# Patient Record
Sex: Male | Born: 1970
Health system: Southern US, Community
[De-identification: ages and names within clinical notes are randomized; demographics above are authoritative.]

## PROBLEM LIST (undated history)

## (undated) ENCOUNTER — Emergency Department (HOSPITAL_COMMUNITY): Admission: EM

## (undated) DIAGNOSIS — R7303 Prediabetes: Secondary | ICD-10-CM

## (undated) DIAGNOSIS — G039 Meningitis, unspecified: Secondary | ICD-10-CM

## (undated) DIAGNOSIS — G709 Myoneural disorder, unspecified: Secondary | ICD-10-CM

## (undated) DIAGNOSIS — G1221 Amyotrophic lateral sclerosis: Secondary | ICD-10-CM

## (undated) HISTORY — DX: Amyotrophic lateral sclerosis: G12.21

## (undated) HISTORY — DX: Morbid (severe) obesity due to excess calories: E66.01

## (undated) HISTORY — DX: Myoneural disorder, unspecified: G70.9

## (undated) HISTORY — DX: Prediabetes: R73.03

## (undated) HISTORY — PX: NO PAST SURGERIES: SHX2092

---

## 1998-08-02 ENCOUNTER — Emergency Department (HOSPITAL_COMMUNITY): Admission: EM | Admit: 1998-08-02 | Discharge: 1998-08-02 | Payer: Self-pay | Admitting: Emergency Medicine

## 2001-07-05 DIAGNOSIS — G039 Meningitis, unspecified: Secondary | ICD-10-CM

## 2001-07-05 HISTORY — DX: Meningitis, unspecified: G03.9

## 2001-12-12 ENCOUNTER — Emergency Department (HOSPITAL_COMMUNITY): Admission: EM | Admit: 2001-12-12 | Discharge: 2001-12-12 | Payer: Self-pay | Admitting: Emergency Medicine

## 2002-01-09 ENCOUNTER — Inpatient Hospital Stay (HOSPITAL_COMMUNITY): Admission: EM | Admit: 2002-01-09 | Discharge: 2002-01-10 | Payer: Self-pay | Admitting: *Deleted

## 2002-01-17 ENCOUNTER — Encounter: Admission: RE | Admit: 2002-01-17 | Discharge: 2002-01-17 | Payer: Self-pay | Admitting: Internal Medicine

## 2011-01-20 ENCOUNTER — Emergency Department (HOSPITAL_COMMUNITY)
Admission: EM | Admit: 2011-01-20 | Discharge: 2011-01-20 | Disposition: A | Discharge status: Home / Self Care | Payer: 59 | Attending: Emergency Medicine | Admitting: Emergency Medicine

## 2011-01-20 DIAGNOSIS — T7840XA Allergy, unspecified, initial encounter: Secondary | ICD-10-CM | POA: Insufficient documentation

## 2011-01-20 DIAGNOSIS — I10 Essential (primary) hypertension: Secondary | ICD-10-CM | POA: Insufficient documentation

## 2011-01-20 DIAGNOSIS — L298 Other pruritus: Secondary | ICD-10-CM | POA: Insufficient documentation

## 2011-01-20 DIAGNOSIS — R21 Rash and other nonspecific skin eruption: Secondary | ICD-10-CM | POA: Insufficient documentation

## 2011-01-20 DIAGNOSIS — L509 Urticaria, unspecified: Secondary | ICD-10-CM | POA: Insufficient documentation

## 2011-01-20 DIAGNOSIS — L2989 Other pruritus: Secondary | ICD-10-CM | POA: Insufficient documentation

## 2011-11-03 ENCOUNTER — Emergency Department (INDEPENDENT_AMBULATORY_CARE_PROVIDER_SITE_OTHER): Payer: 59

## 2011-11-03 ENCOUNTER — Emergency Department (HOSPITAL_COMMUNITY)
Admission: EM | Admit: 2011-11-03 | Discharge: 2011-11-03 | Disposition: A | Discharge status: Home / Self Care | Payer: 59 | Source: Home / Self Care | Attending: Emergency Medicine | Admitting: Emergency Medicine

## 2011-11-03 ENCOUNTER — Encounter (HOSPITAL_COMMUNITY): Payer: Self-pay

## 2011-11-03 DIAGNOSIS — M659 Synovitis and tenosynovitis, unspecified: Secondary | ICD-10-CM

## 2011-11-03 DIAGNOSIS — M65979 Unspecified synovitis and tenosynovitis, unspecified ankle and foot: Secondary | ICD-10-CM

## 2011-11-03 MED ORDER — MELOXICAM 15 MG PO TABS: 15.0000 mg | ORAL_TABLET | Freq: Every day | ORAL | Status: AC

## 2011-11-03 MED ORDER — HYDROCODONE-ACETAMINOPHEN 5-325 MG PO TABS: 2.0000 | ORAL_TABLET | ORAL | Status: AC | PRN

## 2011-11-03 NOTE — ED Provider Notes (Signed)
History     CSN: [id number]  Arrival date & time 11/03/11  0908   First MD Initiated Contact with Patient 11/03/11 1003      Chief Complaint  Patient presents with  . Foot Pain    (Consider location/radiation/quality/duration/timing/severity/associated sxs/prior treatment) HPI Comments: Patient reports intermittent, dull, nonradiating achy pain in his first right toe for several weeks. States the pain has now moved into the MTP joint. Pain seems to be located on the bottom of foot. Mild localized swelling. No redness, hyperesthesias, fevers. Pain is worse with extension of the toe, palpation. No alleviating factors. Patient has not tried anything for this. Patient denies any change in physical activity, change in footwear, history of trauma to this foot. No history of diabetes, gout, OA, RA.  ROS as noted in HPI. All other ROS negative.   Patient is a 41 y.o. male presenting with toe pain. The history is provided by the patient. No language interpreter was used.  Toe Pain This is a new problem. The current episode started more than 2 days ago. The problem occurs constantly. The problem has been gradually worsening. The symptoms are aggravated by walking. The symptoms are relieved by nothing. He has tried nothing for the symptoms.    History reviewed. No pertinent past medical history.  History reviewed. No pertinent past surgical history.  History reviewed. No pertinent family history.  History  Substance Use Topics  . Smoking status: Never Smoker   . Smokeless tobacco: Not on file  . Alcohol Use: No      Review of Systems  Allergies  Review of patient's allergies indicates no known allergies.  Home Medications   Current Outpatient Rx  Name Route Sig Dispense Refill  . HYDROCODONE-ACETAMINOPHEN 5-325 MG PO TABS Oral Take 2 tablets by mouth every 4 (four) hours as needed for pain. 20 tablet 0  . MELOXICAM 15 MG PO TABS Oral Take 1 tablet (15 mg total) by mouth daily.  14 tablet 0    BP 134/79  Pulse 61  Temp(Src) 98.7 F (37.1 C) (Oral)  Resp 14  SpO2 98%  Physical Exam  Nursing note and vitals reviewed. Constitutional: He is oriented to person, place, and time. He appears well-developed and well-nourished.  HENT:  Head: Normocephalic and atraumatic.  Eyes: Conjunctivae and EOM are normal.  Neck: Normal range of motion.  Cardiovascular: Normal rate.   Pulmonary/Chest: Effort normal. No respiratory distress.  Abdominal: He exhibits no distension.  Musculoskeletal: Normal range of motion.       Right MTP swelling, increased warmth compared to the other foot. No redness. Tenderness on plantar aspect of foot at the MTP. Pain with passive dorsiflexion of total No tenderness at the side on the top of the MTP. Joint stable on varus/valgus stress. No hallux deformity. midfoot NT. Base of fifth metatarsal NT. No bruising. Skin intact. DP 2+. Refill less than 2 seconds. Sensation grossly intact. Patient able to move all toes actively. Ankle normal.. Patient able to bear weight while in department.  Neurological: He is alert and oriented to person, place, and time.  Skin: Skin is warm and dry.  Psychiatric: He has a normal mood and affect. His behavior is normal.    ED Course  Procedures (including critical care time)  Labs Reviewed - No data to display Dg Foot Complete Right  11/03/2011  *RADIOLOGY REPORT*  Clinical Data: Right big toe pain.  RIGHT FOOT COMPLETE - 3+ VIEW  Comparison: None.  Findings: No evidence  for acute fracture.  No subluxation or dislocation.  There may be some mineralization in the capsule of the MTP joint of the great toe, finding of indeterminate etiology. No substantial degenerative changes.  IMPRESSION: No acute bony findings.  Original Report Authenticated By: ERIC A. MANSELL, M.D.     1. Toe tendonitis      X-ray reviewed by myself. Osteophytes at first MTP. No fracture noted, as read by me. full report per  radiology.  MDM  H&P most consistent with a tendinitis at the MTP. No evidence of gout, septic arthritis. You will x-ray to rule out severe arthritis, fracture. If negative, sending home with Ace wrap, ice stiff soled shoe, NSAIDs, Norco PRN.Marland Kitchen Discussed imaging finding with patient. Will refer to podiatry or Dr. supple, orthopedics on call. Patient agrees with plan.  Luiz Blare, MD 11/03/11 1147

## 2011-11-03 NOTE — ED Notes (Signed)
Pain in right foot for couple of weeks, worse past 1-2 days; has used no  OTC med for this problem

## 2011-11-03 NOTE — Discharge Instructions (Signed)
Take the meloxicam every day for 2 weeks. He may use the Norco for severe pain. Continue the Ace wrap and the stiff soled shoe as needed for comfort. Return if you have a fever above 100.4, joint becomes red, extremely swollen or painful, if you get worse, or for any other concerns.

## 2014-11-11 ENCOUNTER — Ambulatory Visit (INDEPENDENT_AMBULATORY_CARE_PROVIDER_SITE_OTHER): Payer: 59 | Admitting: General Surgery

## 2014-11-11 ENCOUNTER — Encounter: Payer: Self-pay | Admitting: General Surgery

## 2014-11-11 VITALS — BP 140/78 | HR 78 | Resp 14 | Ht 65.0 in | Wt 185.0 lb

## 2014-11-11 DIAGNOSIS — R1909 Other intra-abdominal and pelvic swelling, mass and lump: Secondary | ICD-10-CM

## 2014-11-11 DIAGNOSIS — R2241 Localized swelling, mass and lump, right lower limb: Secondary | ICD-10-CM

## 2014-11-11 NOTE — Patient Instructions (Signed)
Patient's surgery has been scheduled for 11-26-14 at [facility].

## 2014-11-11 NOTE — Progress Notes (Signed)
Patient ID: Jerry [name], male   DOB: Jun 30, 1971, 44 y.o.   MRN: [id number]  Chief Complaint  Patient presents with  . Other    right inguinal hernia    HPI Jerry [name] is a 44 y.o. male here today for a evaluation of a right inguinal hernia and cyst on his right posterior thigh. Patient sates he noticed his right groin area swelling about 3 months. He reports that is stays swollen and activity does not seem to affect this and the are is not painful. The cyst has been on his right thigh for about a year. He reports no pain with this except when it is traumatized against a hard surface.  HPI  No past medical history on file.  No past surgical history on file.  Family History  Problem Relation Age of Onset  . Diabetes Father   . Diabetes Mother     Social History History  Substance Use Topics  . Smoking status: Never Smoker   . Smokeless tobacco: Never Used  . Alcohol Use: No    Allergies  Allergen Reactions  . Crab [Shellfish Allergy] Rash    Current Outpatient Prescriptions  Medication Sig Dispense Refill  . Fexofenadine HCl (ALLERGY 24-HR PO) Take 1 tablet by mouth daily.     No current facility-administered medications for this visit.    Review of Systems Review of Systems  Constitutional: Negative.   Respiratory: Negative.   Cardiovascular: Negative.   Gastrointestinal: Negative.     Blood pressure 140/78, pulse 78, resp. rate 14, height 5\' 5"  (1.651 m), weight 185 lb (83.915 kg).  Physical Exam Physical Exam  Constitutional: He is oriented to person, place, and time. He appears well-developed and well-nourished.  Eyes: Conjunctivae are normal. No scleral icterus.  Neck: Neck supple.  Cardiovascular: Normal rate, regular rhythm and normal heart sounds.   Pulmonary/Chest: Effort normal and breath sounds normal.  Abdominal: Soft. Normal appearance and bowel sounds are normal. There is no tenderness.    3x5 cm soft fullness in the right groin    Musculoskeletal:       Legs: Lymphadenopathy:    He has no cervical adenopathy.  Neurological: He is alert and oriented to person, place, and time.  Skin: Skin is warm and dry.  Top of posterior right thigh 2x4 soft fullness.     Data Reviewed PCP records of 11/04/2014 reviewed.  Assessment    Right inguinal soft tissue mass, unlikely hernia.  Right posterior thigh mass.    Plan    Arrangements have been made for excision of both lesions, presently scheduled for 11-26-14 at [facility].       PCP:  Frazier Butt 11/12/2014, 11:15 AM

## 2014-11-12 DIAGNOSIS — R2240 Localized swelling, mass and lump, unspecified lower limb: Secondary | ICD-10-CM | POA: Insufficient documentation

## 2014-11-12 DIAGNOSIS — R1909 Other intra-abdominal and pelvic swelling, mass and lump: Secondary | ICD-10-CM | POA: Insufficient documentation

## 2014-11-12 NOTE — H&P (Signed)
HPI  Jerry [name] is a 44 y.o. male here today for a evaluation of a right inguinal hernia and cyst on his right posterior thigh. Patient sates he noticed his right groin area swelling about 3 months. He reports that is stays swollen and activity does not seem to affect this and the are is not painful. The cyst has been on his right thigh for about a year. He reports no pain with this except when it is traumatized against a hard surface.  HPI  No past medical history on file.  No past surgical history on file.  Family History   Problem  Relation  Age of Onset   .  Diabetes  Father    .  Diabetes  Mother     Social History  History   Substance Use Topics   .  Smoking status:  Never Smoker   .  Smokeless tobacco:  Never Used   .  Alcohol Use:  No    Allergies   Allergen  Reactions   .  Crab [Shellfish Allergy]  Rash    Current Outpatient Prescriptions   Medication  Sig  Dispense  Refill   .  Fexofenadine HCl (ALLERGY 24-HR PO)  Take 1 tablet by mouth daily.      No current facility-administered medications for this visit.    Review of Systems  Review of Systems  Constitutional: Negative.  Respiratory: Negative.  Cardiovascular: Negative.  Gastrointestinal: Negative.   Blood pressure 140/78, pulse 78, resp. rate 14, height 5\' 5"  (1.651 m), weight 185 lb (83.915 kg).  Physical Exam  Physical Exam  Constitutional: He is oriented to person, place, and time. He appears well-developed and well-nourished.  Eyes: Conjunctivae are normal. No scleral icterus.  Neck: Neck supple.  Cardiovascular: Normal rate, regular rhythm and normal heart sounds.  Pulmonary/Chest: Effort normal and breath sounds normal.  Abdominal: Soft. Normal appearance and bowel sounds are normal. There is no tenderness.    3x5 cm soft fullness in the right groin  Musculoskeletal:  Legs: Lymphadenopathy:  He has no cervical adenopathy.  Neurological: He is alert and oriented to person, place, and time.    Skin: Skin is warm and dry.  Top of posterior right thigh 2x4 soft fullness.   Data Reviewed  PCP records of 11/04/2014 reviewed.  Assessment   Right inguinal soft tissue mass, unlikely hernia.  Right posterior thigh mass.   Plan   Arrangements have been made for excision of both lesions, presently scheduled for 11-26-14 at [facility].   PCP: Frazier Butt  11/12/2014, 11:15 AM

## 2014-11-19 ENCOUNTER — Inpatient Hospital Stay: Admission: RE | Admit: 2014-11-19 | Payer: Self-pay | Source: Ambulatory Visit

## 2014-11-19 ENCOUNTER — Encounter: Payer: Self-pay | Admitting: *Deleted

## 2014-11-19 DIAGNOSIS — Z91013 Allergy to seafood: Secondary | ICD-10-CM | POA: Diagnosis not present

## 2014-11-19 DIAGNOSIS — D1723 Benign lipomatous neoplasm of skin and subcutaneous tissue of right leg: Secondary | ICD-10-CM | POA: Diagnosis present

## 2014-11-19 DIAGNOSIS — Z79899 Other long term (current) drug therapy: Secondary | ICD-10-CM | POA: Diagnosis not present

## 2014-11-19 NOTE — Patient Instructions (Signed)
  Your procedure is scheduled on 11-26-14 Report to [facility] same Day Surgery 2nd Floor To find out your arrival time please call [phone] between 1PM - 3PM on 11-25-14  Remember: Instructions that are not followed completely may result in serious medical risk, up to and including death, or upon the discretion of your surgeon and anesthesiologist your surgery may need to be rescheduled.    __x__ 1. Do not eat food or drink liquids after midnight. No gum chewing or hard candies.     __x__ 2. No Alcohol for 24 hours before or after surgery.   ____ 3. Bring all medications with you on the day of surgery if instructed.    __x__ 4. Notify your doctor if there is any change in your medical condition     (cold, fever, infections).     Do not wear jewelry, make-up, hairpins, clips or nail polish.  Do not wear lotions, powders, or perfumes. You may wear deodorant.  Do not shave 48 hours prior to surgery. Men may shave face and neck.  Do not bring valuables to the hospital.    [facility] is not responsible for any belongings or valuables.               Contacts, dentures or bridgework may not be worn into surgery.  Leave your suitcase in the car. After surgery it may be brought to your room.  For patients admitted to the hospital, discharge time is determined by your treatment team.   Patients discharged the day of surgery will not be allowed to drive home.   Please read over the following fact sheets that you were given:     ____ Take these medicines the morning of surgery with A SIP OF WATER:    1.   2.   3.   4.  5.  6.  ____ Fleet Enema (as directed)   ____ Use CHG Soap as directed  ____ Use inhalers on the day of surgery  ____ Stop metformin 2 days prior to surgery    ____ Take 1/2 of usual insulin dose the night before surgery and none on the morning of surgery.   ____ Stop Coumadin/Plavix/aspirin on   ____ Stop Anti-inflammatories on    __x__ Stop  supplements until after surgery. NOW   ____ Bring C-Pap to the hospital.

## 2014-11-26 ENCOUNTER — Encounter
Admission: RE | Disposition: A | Discharge status: Home / Self Care | Payer: Self-pay | Source: Ambulatory Visit | Attending: General Surgery

## 2014-11-26 ENCOUNTER — Ambulatory Visit
Admission: RE | Admit: 2014-11-26 | Discharge: 2014-11-26 | Disposition: A | Discharge status: Home / Self Care | Payer: 59 | Source: Ambulatory Visit | Attending: General Surgery | Admitting: General Surgery

## 2014-11-26 ENCOUNTER — Ambulatory Visit: Payer: 59 | Admitting: Anesthesiology

## 2014-11-26 DIAGNOSIS — D1723 Benign lipomatous neoplasm of skin and subcutaneous tissue of right leg: Secondary | ICD-10-CM | POA: Insufficient documentation

## 2014-11-26 DIAGNOSIS — Z91013 Allergy to seafood: Secondary | ICD-10-CM | POA: Insufficient documentation

## 2014-11-26 DIAGNOSIS — Z79899 Other long term (current) drug therapy: Secondary | ICD-10-CM | POA: Insufficient documentation

## 2014-11-26 DIAGNOSIS — D171 Benign lipomatous neoplasm of skin and subcutaneous tissue of trunk: Secondary | ICD-10-CM | POA: Diagnosis not present

## 2014-11-26 HISTORY — PX: MASS EXCISION: SHX2000

## 2014-11-26 HISTORY — DX: Meningitis, unspecified: G03.9

## 2014-11-26 SURGERY — EXCISION MASS
Anesthesia: Monitor Anesthesia Care | Laterality: Right | Wound class: Clean

## 2014-11-26 MED ORDER — FAMOTIDINE 20 MG PO TABS
20.0000 mg | ORAL_TABLET | Freq: Once | ORAL | Status: AC
  Administered 2014-11-26: 20 mg via ORAL

## 2014-11-26 MED ORDER — FAMOTIDINE 20 MG PO TABS
ORAL_TABLET | ORAL | Status: AC
  Administered 2014-11-26: 20 mg via ORAL
  Filled 2014-11-26: qty 1

## 2014-11-26 MED ORDER — ONDANSETRON HCL 4 MG/2ML IJ SOLN: 4.0000 mg | Freq: Once | INTRAMUSCULAR | Status: DC | PRN

## 2014-11-26 MED ORDER — LACTATED RINGERS IV SOLN
INTRAVENOUS | Status: DC
  Administered 2014-11-26 (×2): via INTRAVENOUS

## 2014-11-26 MED ORDER — BUPIVACAINE HCL (PF) 0.5 % IJ SOLN
INTRAMUSCULAR | Status: AC
  Filled 2014-11-26: qty 30

## 2014-11-26 MED ORDER — BUPIVACAINE HCL (PF) 0.5 % IJ SOLN
INTRAMUSCULAR | Status: DC | PRN
  Administered 2014-11-26: 24 mL

## 2014-11-26 MED ORDER — FENTANYL CITRATE (PF) 100 MCG/2ML IJ SOLN
INTRAMUSCULAR | Status: DC | PRN
  Administered 2014-11-26 (×2): 50 ug via INTRAVENOUS

## 2014-11-26 MED ORDER — LIDOCAINE-EPINEPHRINE (PF) 1 %-1:200000 IJ SOLN
INTRAMUSCULAR | Status: DC | PRN
  Administered 2014-11-26: 24 mL

## 2014-11-26 MED ORDER — PROPOFOL INFUSION 10 MG/ML OPTIME
INTRAVENOUS | Status: DC | PRN
  Administered 2014-11-26: 75 ug/kg/min via INTRAVENOUS

## 2014-11-26 MED ORDER — LIDOCAINE HCL (CARDIAC) 20 MG/ML IV SOLN
INTRAVENOUS | Status: DC | PRN
  Administered 2014-11-26: 40 mg via INTRAVENOUS

## 2014-11-26 MED ORDER — FENTANYL CITRATE (PF) 100 MCG/2ML IJ SOLN: 25.0000 ug | INTRAMUSCULAR | Status: DC | PRN

## 2014-11-26 MED ORDER — MIDAZOLAM HCL 5 MG/5ML IJ SOLN
INTRAMUSCULAR | Status: DC | PRN
  Administered 2014-11-26 (×2): 1 mg via INTRAVENOUS

## 2014-11-26 MED ORDER — LIDOCAINE-EPINEPHRINE (PF) 1 %-1:200000 IJ SOLN
INTRAMUSCULAR | Status: AC
  Filled 2014-11-26: qty 30

## 2014-11-26 MED ORDER — HYDROCODONE-ACETAMINOPHEN 5-325 MG PO TABS: 1.0000 | ORAL_TABLET | ORAL | Status: DC | PRN

## 2014-11-26 MED ORDER — ACETAMINOPHEN 325 MG PO TABS: 650.0000 mg | ORAL_TABLET | ORAL | Status: DC | PRN

## 2014-11-26 MED ORDER — EPHEDRINE SULFATE 50 MG/ML IJ SOLN
INTRAMUSCULAR | Status: DC | PRN
  Administered 2014-11-26: 10 mg via INTRAVENOUS

## 2014-11-26 SURGICAL SUPPLY — 49 items
APL SKNCLS STERI-STRIP NONHPOA (GAUZE/BANDAGES/DRESSINGS) ×2
BAG COUNTER SPONGE EZ (MISCELLANEOUS) ×1 IMPLANT
BAG SPNG 4X4 CLR HAZ (MISCELLANEOUS)
BANDAGE ELASTIC 4 CLIP NS LF (GAUZE/BANDAGES/DRESSINGS) ×1 IMPLANT
BANDAGE ELASTIC 6 CLIP NS LF (GAUZE/BANDAGES/DRESSINGS) ×1 IMPLANT
BENZOIN TINCTURE PRP APPL 2/3 (GAUZE/BANDAGES/DRESSINGS) ×4 IMPLANT
BLADE SURG 15 STRL SS SAFETY (BLADE) ×4 IMPLANT
BNDG GAUZE 4.5X4.1 6PLY STRL (MISCELLANEOUS) ×1 IMPLANT
CANISTER SUCT 1200ML W/VALVE (MISCELLANEOUS) ×3 IMPLANT
CHLORAPREP W/TINT 26ML (MISCELLANEOUS) ×5 IMPLANT
CLOSURE WOUND 1/2 X4 (GAUZE/BANDAGES/DRESSINGS) ×2
COUNTER SPONGE BAG EZ (MISCELLANEOUS)
DECANTER SPIKE VIAL GLASS SM (MISCELLANEOUS) ×4 IMPLANT
DRAPE LAPAROTOMY 100X77 ABD (DRAPES) ×5 IMPLANT
DRAPE SHEET LG 3/4 BI-LAMINATE (DRAPES) ×1 IMPLANT
DRESSING TELFA 4X3 1S ST N-ADH (GAUZE/BANDAGES/DRESSINGS) ×1 IMPLANT
DRSG TEGADERM 2-3/8X2-3/4 SM (GAUZE/BANDAGES/DRESSINGS) ×2 IMPLANT
DRSG TEGADERM 4X4.75 (GAUZE/BANDAGES/DRESSINGS) ×3 IMPLANT
DRSG TELFA 3X8 NADH (GAUZE/BANDAGES/DRESSINGS) ×6 IMPLANT
GAUZE SPONGE 4X4 12PLY STRL (GAUZE/BANDAGES/DRESSINGS) ×1 IMPLANT
GLOVE BIO SURGEON STRL SZ7.5 (GLOVE) ×7 IMPLANT
GLOVE INDICATOR 8.0 STRL GRN (GLOVE) ×7 IMPLANT
GOWN STRL REUS W/ TWL LRG LVL3 (GOWN DISPOSABLE) ×2 IMPLANT
GOWN STRL REUS W/ TWL XL LVL3 (GOWN DISPOSABLE) ×1 IMPLANT
GOWN STRL REUS W/TWL LRG LVL3 (GOWN DISPOSABLE) ×9
GOWN STRL REUS W/TWL XL LVL3 (GOWN DISPOSABLE)
KIT RM TURNOVER STRD PROC AR (KITS) ×3 IMPLANT
LABEL OR SOLS (LABEL) ×3 IMPLANT
NDL HYPO 25X1 1.5 SAFETY (NEEDLE) IMPLANT
NEEDLE HYPO 25X1 1.5 SAFETY (NEEDLE) ×3 IMPLANT
NS IRRIG 500ML POUR BTL (IV SOLUTION) ×3 IMPLANT
PACK BASIN MINOR ARMC (MISCELLANEOUS) ×3 IMPLANT
PAD DRESSING TELFA 3X8 NADH (GAUZE/BANDAGES/DRESSINGS) IMPLANT
PAD GROUND ADULT SPLIT (MISCELLANEOUS) ×3 IMPLANT
PAD PREP 24X41 OB/GYN DISP (PERSONAL CARE ITEMS) ×1 IMPLANT
STOCKINETTE STRL 6IN 960660 (GAUZE/BANDAGES/DRESSINGS) ×1 IMPLANT
STRAP SAFETY BODY (MISCELLANEOUS) ×3 IMPLANT
STRIP CLOSURE SKIN 1/2X4 (GAUZE/BANDAGES/DRESSINGS) ×3 IMPLANT
SUT ETHILON 4-0 (SUTURE)
SUT ETHILON 4-0 FS2 18XMFL BLK (SUTURE)
SUT VIC AB 2-0 CT1 (SUTURE) ×1 IMPLANT
SUT VIC AB 3-0 SH 27 (SUTURE) ×3
SUT VIC AB 3-0 SH 27X BRD (SUTURE) ×1 IMPLANT
SUT VIC AB 4-0 FS2 27 (SUTURE) ×3 IMPLANT
SUT VICRYL+ 3-0 144IN (SUTURE) ×3 IMPLANT
SUTURE ETHLN 4-0 FS2 18XMF BLK (SUTURE) ×1 IMPLANT
SWABSTK COMLB BENZOIN TINCTURE (MISCELLANEOUS) ×5 IMPLANT
SYRINGE 10CC LL (SYRINGE) ×2 IMPLANT
TOWEL OR 17X26 4PK STRL BLUE (TOWEL DISPOSABLE) ×2 IMPLANT

## 2014-11-26 NOTE — Anesthesia Preprocedure Evaluation (Addendum)
Anesthesia Evaluation  Patient identified by MRN, date of birth, ID band Patient awake    Reviewed: Allergy & Precautions, NPO status , Patient's Chart, lab work & pertinent test results  History of Anesthesia Complications Negative for: history of anesthetic complications  Airway Mallampati: II       Dental no notable dental hx.    Pulmonary neg pulmonary ROS,    Pulmonary exam normal       Cardiovascular negative cardio ROS Normal cardiovascular exam    Neuro/Psych negative neurological ROS  negative psych ROS   GI/Hepatic negative GI ROS, Neg liver ROS,   Endo/Other  negative endocrine ROS  Renal/GU negative Renal ROS  negative genitourinary   Musculoskeletal negative musculoskeletal ROS (+)   Abdominal Normal abdominal exam  (+)   Peds negative pediatric ROS (+)  Hematology negative hematology ROS (+)   Anesthesia Other Findings   Reproductive/Obstetrics negative OB ROS                             Anesthesia Physical Anesthesia Plan  ASA: I  Anesthesia Plan: MAC   Post-op Pain Management:    Induction: Intravenous  Airway Management Planned: Oral ETT  Additional Equipment:   Intra-op Plan:   Post-operative Plan: Extubation in OR  Informed Consent: I have reviewed the patients History and Physical, chart, labs and discussed the procedure including the risks, benefits and alternatives for the proposed anesthesia with the patient or authorized representative who has indicated his/her understanding and acceptance.     Plan Discussed with: CRNA and Surgeon  Anesthesia Plan Comments:        Anesthesia Quick Evaluation

## 2014-11-26 NOTE — Anesthesia Postprocedure Evaluation (Signed)
  Anesthesia Post-op Note  Patient: Jerry [name]  Procedure(s) Performed: Procedure(s): EXCISION MASS/GROIN EXCISION MASS (Right) MINOR EXCISION OF MASS/POST THIGH MASS (Right)  Anesthesia type:MAC  Patient location: PACU  Post pain: Pain level controlled  Post assessment: Post-op Vital signs reviewed, Patient's Cardiovascular Status Stable, Respiratory Function Stable, Patent Airway and No signs of Nausea or vomiting  Post vital signs: Reviewed and stable  Last Vitals:  Filed Vitals:   11/26/14 0826  BP:   Pulse: 71  Temp: 36.4 C  Resp: 16    Level of consciousness: awake, alert  and patient cooperative  Complications: No apparent anesthesia complications

## 2014-11-26 NOTE — H&P (Signed)
No change in clinical condition. Plan for excision of right groin and right posterior thigh lesion with MAC anesthesia.

## 2014-11-26 NOTE — Anesthesia Postprocedure Evaluation (Deleted)
  Anesthesia Post-op Note  Patient: Jerry [name]  Procedure(s) Performed: Procedure(s): EXCISION MASS/GROIN EXCISION MASS (Right) MINOR EXCISION OF MASS/POST THIGH MASS (Right)  Anesthesia type:MAC  Patient location: PACU  Post pain: Pain level controlled  Post assessment: Post-op Vital signs reviewed, Patient's Cardiovascular Status Stable, Respiratory Function Stable, Patent Airway and No signs of Nausea or vomiting  Post vital signs: Reviewed and stable  Last Vitals:  Filed Vitals:   11/26/14 0826  BP:   Pulse: 71  Temp: 36.4 C  Resp: 16    Level of consciousness: awake, alert  and patient cooperative  Complications: No apparent anesthesia complications

## 2014-11-26 NOTE — Discharge Instructions (Signed)
May shower at any time.   May remove outer plastic dressings in three days if desired. Ice to area for comfort. No driving until pain free.

## 2014-11-26 NOTE — Transfer of Care (Signed)
Immediate Anesthesia Transfer of Care Note  Patient: Jerry [name]  Procedure(s) Performed: Procedure(s): EXCISION MASS/GROIN EXCISION MASS (Right) MINOR EXCISION OF MASS/POST THIGH MASS (Right)  Patient Location: PACU  Anesthesia Type:MAC  Level of Consciousness: sedated  Airway & Oxygen Therapy: Patient Spontanous Breathing and Patient connected to face mask oxygen  Post-op Assessment: Report given to RN and Post -op Vital signs reviewed and stable  Post vital signs: stable  Last Vitals:  Filed Vitals:   11/26/14 0826  BP:   Pulse: 71  Temp: 36.4 C  Resp: 16    Complications: No apparent anesthesia complications

## 2014-11-26 NOTE — Op Note (Signed)
Preoperative diagnosis: Lipoma right groin, right posterior thigh.  Postoperative diagnosis: Same.  Operative procedure: Excision of lipomas right groin, right posterior thigh.  Operating surgeon: Lacie Scotts, M.D.  Anesthesia: Attended local, 48 mL 0.5% Xylocaine with 0.25% Marcaine with 1-200,000 of epinephrine.  Estimated blood loss: Less than 5 mL.  Clinical note this: This 44 year old male developed nodules in the right groin and right posterior thigh. He is motor for elective excision.  Operative note with the patient in the supine position the right groin was prepped with chlor prep and drape. Local site was infiltrated and well tolerated. A skin line incision was made and carried at the skin and subcutaneous tissue whereupon a large 4 x 6 x 3 cm lipoma was identified. This tracked down towards the scrotal skin. Hemostasis was electrocautery and 30 Vicryls ties. The skin was closed with a running 40 Vicryls septic suture. Telfa and Tegaderm benzoin Steri-Strips applied.  The patient was rolled to the left lateral decubitus position and the right posterior thigh lesion exposed. This was prepped with chlor prep last lesion infiltrated. A small 1/2 cm lipoma was excised. The wound was closed with a running 30 Vicryls septic suture. The wound was dressed as for the groin lesion.  The patient tolerated procedure well and was taken to recovery in stable condition.

## 2014-11-27 ENCOUNTER — Encounter: Payer: Self-pay | Admitting: General Surgery

## 2014-11-27 NOTE — Progress Notes (Signed)
   11/27/14 0800  Clinical Encounter Type  Visited With Family  Visit Type Spiritual support  Referral From Other (Comment) (Met patients wife while patient was in surgery)  Consult/Referral To Chaplain  Spiritual Encounters  Spiritual Needs Prayer;Emotional  Stress Factors  Patient Stress Factors Health changes  Family Stress Factors Exhausted  Advance Directives (For Healthcare)  Does patient have an advance directive? No   Chaplain provided empathic listening and therapeutic presence for patient's wife. AD [phone]

## 2014-11-29 ENCOUNTER — Telehealth: Payer: Self-pay

## 2014-11-29 LAB — SURGICAL PATHOLOGY

## 2014-11-29 NOTE — Telephone Encounter (Signed)
-----   Message from Robert Bellow, MD sent at 11/29/2014 11:18 AM EDT ----- Please notify the patient that the lesions removed from the right groin and right posterior thigh were benign as anticipated. Follow-up as scheduled.  ----- Message -----    From: [facility] Interface    Sent: 11/29/2014  11:11 AM      To: Robert Bellow, MD

## 2014-12-03 ENCOUNTER — Ambulatory Visit (INDEPENDENT_AMBULATORY_CARE_PROVIDER_SITE_OTHER): Payer: 59 | Admitting: General Surgery

## 2014-12-03 ENCOUNTER — Encounter: Payer: Self-pay | Admitting: General Surgery

## 2014-12-03 VITALS — BP 128/74 | HR 64 | Resp 12 | Ht 65.0 in | Wt 199.0 lb

## 2014-12-03 DIAGNOSIS — R1909 Other intra-abdominal and pelvic swelling, mass and lump: Secondary | ICD-10-CM

## 2014-12-03 DIAGNOSIS — R2241 Localized swelling, mass and lump, right lower limb: Secondary | ICD-10-CM

## 2014-12-03 NOTE — Telephone Encounter (Signed)
Notified patient as instructed, patient pleased °

## 2014-12-03 NOTE — Progress Notes (Signed)
Patient ID: Jerry [name], male   DOB: 22-Apr-1971, 44 y.o.   MRN: [id number]  Chief Complaint  Patient presents with  . Other    Lipoma Excison    HPI Jerry [name] is a 44 y.o. male. Here today for one week right groin lipoma excision, right posterior thigh lipoma excision completed on 11/26/14. Patient states doing well, not taking any pain medication. HPI  Past Medical History  Diagnosis Date  . Meningitis 2003    spinal    Past Surgical History  Procedure Laterality Date  . No past surgeries    . Mass excision Right 11/26/2014    Procedure: EXCISION MASS/GROIN EXCISION MASS;  Surgeon: Robert Bellow, MD;  Location: [facility] ORS;  Service: General;  Laterality: Right;  . Mass excision Right 11/26/2014    Procedure: MINOR EXCISION OF MASS/POST THIGH MASS;  Surgeon: Robert Bellow, MD;  Location: [facility] ORS;  Service: General;  Laterality: Right;    Family History  Problem Relation Age of Onset  . Diabetes Father   . Diabetes Mother     Social History History  Substance Use Topics  . Smoking status: Never Smoker   . Smokeless tobacco: Never Used  . Alcohol Use: No    Allergies  Allergen Reactions  . Crab [Shellfish Allergy] Rash    Current Outpatient Prescriptions  Medication Sig Dispense Refill  . Fexofenadine HCl (ALLERGY 24-HR PO) Take 1 tablet by mouth daily.     No current facility-administered medications for this visit.    Review of Systems Review of Systems  Constitutional: Negative.   Respiratory: Negative.   Cardiovascular: Negative.     Blood pressure 128/74, pulse 64, resp. rate 12, height 5\' 5"  (1.651 m), weight 199 lb (90.266 kg).  Physical Exam Physical Exam  Constitutional: He is oriented to person, place, and time. He appears well-developed and well-nourished.  Eyes: Pupils are equal, round, and reactive to light.  Neurological: He is alert and oriented to person, place, and time.  Skin: Skin is warm and dry.  Well healed incisions right  groin and posterior right thigh.    Data Reviewed Pathology of both lesions consistent with lipoma.  Assessment    Doing well status post lipoma excision.    Plan     The patient will contact the office if he experiences any problems. Follow up otherwise will be on an as-needed basis.    PCP: Tempie Hoist 12/03/2014, 10:34 AM

## 2016-04-13 ENCOUNTER — Encounter: Payer: Self-pay | Admitting: Family Medicine

## 2016-07-19 ENCOUNTER — Encounter: Payer: Self-pay | Admitting: Family Medicine

## 2016-09-21 ENCOUNTER — Encounter: Payer: Self-pay | Admitting: Family Medicine

## 2016-09-21 ENCOUNTER — Ambulatory Visit (INDEPENDENT_AMBULATORY_CARE_PROVIDER_SITE_OTHER): Payer: Medicaid Other

## 2016-09-21 DIAGNOSIS — Z23 Encounter for immunization: Secondary | ICD-10-CM

## 2016-11-04 ENCOUNTER — Ambulatory Visit (INDEPENDENT_AMBULATORY_CARE_PROVIDER_SITE_OTHER): Payer: Medicaid Other | Admitting: Family Medicine

## 2016-11-04 ENCOUNTER — Encounter: Payer: Self-pay | Admitting: Family Medicine

## 2016-11-04 VITALS — BP 130/84 | HR 62 | Temp 98.2°F | Resp 18 | Ht 65.0 in | Wt 203.7 lb

## 2016-11-04 DIAGNOSIS — Z0000 Encounter for general adult medical examination without abnormal findings: Secondary | ICD-10-CM | POA: Diagnosis not present

## 2016-11-04 DIAGNOSIS — Z833 Family history of diabetes mellitus: Secondary | ICD-10-CM | POA: Diagnosis not present

## 2016-11-04 DIAGNOSIS — R519 Headache, unspecified: Secondary | ICD-10-CM

## 2016-11-04 DIAGNOSIS — R51 Headache: Secondary | ICD-10-CM | POA: Diagnosis not present

## 2016-11-04 DIAGNOSIS — Z13220 Encounter for screening for lipoid disorders: Secondary | ICD-10-CM | POA: Diagnosis not present

## 2016-11-04 DIAGNOSIS — G4730 Sleep apnea, unspecified: Secondary | ICD-10-CM

## 2016-11-04 DIAGNOSIS — E669 Obesity, unspecified: Secondary | ICD-10-CM

## 2016-11-04 LAB — LIPID PANEL
CHOL/HDL RATIO: 2.8 ratio (ref <5.0)
Cholesterol: 150 mg/dL (ref <200)
HDL: 54 mg/dL (ref >40)
LDL CALC: 82 mg/dL (ref <100)
Triglycerides: 71 mg/dL (ref <150)
VLDL: 14 mg/dL (ref <30)

## 2016-11-04 LAB — COMPLETE METABOLIC PANEL WITH GFR
ALT: 15 U/L (ref 9–46)
AST: 17 U/L (ref 10–40)
Albumin: 4.4 g/dL (ref 3.6–5.1)
Alkaline Phosphatase: 60 U/L (ref 40–115)
BILIRUBIN TOTAL: 0.4 mg/dL (ref 0.2–1.2)
BUN: 7 mg/dL (ref 7–25)
CHLORIDE: 107 mmol/L (ref 98–110)
CO2: 27 mmol/L (ref 20–31)
CREATININE: 1.1 mg/dL (ref 0.60–1.35)
Calcium: 9.4 mg/dL (ref 8.6–10.3)
GFR, Est Non African American: 81 mL/min (ref >=60)
Glucose, Bld: 90 mg/dL (ref 65–99)
Potassium: 4.9 mmol/L (ref 3.5–5.3)
Sodium: 142 mmol/L (ref 135–146)
Total Protein: 7.2 g/dL (ref 6.1–8.1)

## 2016-11-04 NOTE — Progress Notes (Addendum)
Name: Jerry [name]   MRN: [id number]    DOB: Apr 20, 1971   Date:11/04/2016       Progress Note  Subjective  Chief Complaint  Chief Complaint  Patient presents with  . Annual Exam    HPI  Pt presents for annual physical, and he has not been seen in over two years. He has been feeling well overall but has concern for headaches as below:  Headaches: once a week at most, causes him to want to rest for about 8 months. He denies photo/phonophobia, frontal and sinus pain.  His wife has noted that he sounds like he has some irregular breathing at night, but no snoring; he questions if this is the cause of his headaches. Also notes ongoing intermittent allergic rhinitis and sneezing and questions if this is the cause.  He usually doesn't take any medication and the headaches and they go away on their own after about 8 hours. Increased stress over the last year with loss of job. No dizziness, nausea/vomiting.   USPSTF Recommendations: Depression: Negative Hypertension: Negative Obesity: BMI 33.9, pt would like to get down to 185lbs. Plans to walk more often, changing eating habits to avoid sodas, and eating healthier. Goal to walk 3 times a week for 20 minutes. ETOH: No ETOH use Tobacco: No use, no e-cig use HIV: Has had HIV screening in the past and states was negative STD testing and prevention (chl/gon/syphilis): declines Lipids: Will check today Glucose: Will check today Colorectal cancer: No fam hx Prostate cancer: No fam hx, denies symptoms. Lung cancer: No fam hx, no tobacco hx AAA: No indicated Vision: Last appt was 2016, plans to go soon.  Patient Active Problem List   Diagnosis Date Noted  . Family history of diabetes mellitus 11/04/2016  . Mass of thigh 11/12/2014  . Right groin mass 11/12/2014    Past Surgical History:  Procedure Laterality Date  . MASS EXCISION Right 11/26/2014   Procedure: EXCISION MASS/GROIN EXCISION MASS;  Surgeon: Robert Bellow, MD;  Location: [facility]  ORS;  Service: General;  Laterality: Right;  . MASS EXCISION Right 11/26/2014   Procedure: MINOR EXCISION OF MASS/POST THIGH MASS;  Surgeon: Robert Bellow, MD;  Location: [facility] ORS;  Service: General;  Laterality: Right;  . NO PAST SURGERIES      Family History  Problem Relation Age of Onset  . Diabetes Father   . Diabetes Mother     Social History   Social History  . Marital status: Married    Spouse name: N/A  . Number of children: N/A  . Years of education: N/A   Occupational History  . Not on file.   Social History Main Topics  . Smoking status: Never Smoker  . Smokeless tobacco: Never Used  . Alcohol use No  . Drug use: No  . Sexual activity: Not on file   Other Topics Concern  . Not on file   Social History Narrative  . No narrative on file     Current Outpatient Prescriptions:  .  Fexofenadine HCl (ALLERGY 24-HR PO), Take 1 tablet by mouth daily., Disp: , Rfl:   Allergies  Allergen Reactions  . Crab [Shellfish Allergy] Rash     ROS  Constitutional: Negative for fever or weight change.  Respiratory: Negative for cough and shortness of breath.   Cardiovascular: Negative for chest pain or palpitations.  Gastrointestinal: Negative for abdominal pain, no bowel changes.  Musculoskeletal: Negative for gait problem or joint swelling.  Skin: Negative for  rash.  Neurological: Negative for dizziness or headache.  No other specific complaints in a complete review of systems (except as listed in HPI above).  Objective  Vitals:   11/04/16 0917  BP: 130/84  Pulse: 62  Resp: 18  Temp: 98.2 F (36.8 C)  TempSrc: Oral  SpO2: 98%  Weight: 203 lb 11.2 oz (92.4 kg)  Height: 5\' 5"  (1.651 m)    Body mass index is 33.9 kg/m.  Physical Exam Constitutional: Patient appears well-developed and well-nourished. No distress.  HENT: Head: Normocephalic and atraumatic. Ears: TMs ok, no erythema or effusion; Nose: Nose normal. Mouth/Throat: Oropharynx is clear and  moist. No oropharyngeal exudate.  Eyes: Conjunctivae and EOM are normal. Pupils are equal, round, and reactive to light. No scleral icterus.  Neck: Normal range of motion. Neck supple. No JVD present. No thyromegaly present.  Cardiovascular: Normal rate, regular rhythm and normal heart sounds.  No murmur heard. No BLE edema. Pulmonary/Chest: Effort normal and breath sounds normal. No respiratory distress. Abdominal: Soft. Bowel sounds are normal, no distension. There is no tenderness. no masses MALE GENITALIA: Deferred RECTAL: Deferred Musculoskeletal: Normal range of motion, no joint effusions. No gross deformities Neurological: he is alert and oriented to person, place, and time. No cranial nerve deficit. Coordination, balance, strength, speech and gait are normal.  Skin: Skin is warm and dry. No rash noted. No erythema.  Psychiatric: Patient has a normal mood and affect. behavior is normal. Judgment and thought content normal.  No results found for this or any previous visit (from the past 2160 hour(s)).   PHQ2/9: Depression screen PHQ 2/9 11/04/2016  Decreased Interest 0  Down, Depressed, Hopeless 0  PHQ - 2 Score 0    Fall Risk: Fall Risk  11/04/2016  Falls in the past year? No    Functional Status Survey: Is the patient deaf or have difficulty hearing?: No Does the patient have difficulty seeing, even when wearing glasses/contacts?: Yes (Wears glasses (last appt was 2016)) Does the patient have difficulty concentrating, remembering, or making decisions?: No Does the patient have difficulty walking or climbing stairs?: No Does the patient have difficulty dressing or bathing?: No Does the patient have difficulty doing errands alone such as visiting a doctor's office or shopping?: No  Assessment & Plan  1. Encounter for annual physical exam -Discussed current USPTF guidelines during CPE -Discussed self testicular exam  2. Nonintractable episodic headache, unspecified headache  type -Take Advil or Aleve OTC PRN for episodes of headaches. Take allergy medication OTC PRN for sinus pressure. -Red flags and when to present for emergency care or RTC including fever >101.36F, chest pain, shortness of breath, new/worsening/un-resolving symptoms, blurred vision, slurred speech, weakness, intractable headache pain, facial droop reviewed with patient at time of visit. Follow up and care instructions discussed and provided in AVS. Follow up in 2-4 weeks if headaches are not resolving with conservative measures.  3. Family history of diabetes mellitus  - COMPLETE METABOLIC PANEL WITH GFR - Hemoglobin A1c  4. Encounter for screening for lipid disorder  - Lipid panel  5. Obesity (BMI 30-39.9)  - COMPLETE METABOLIC PANEL WITH GFR - Hemoglobin A1c - Lipid panel -Lifestyle modifications as discussed -Discussed importance of 150 minutes of physical activity weekly, eat two servings of fish weekly, eat one serving of tree nuts ( cashews, pistachios, pecans, almonds.Marland Kitchen) every other day, eat 6 servings of fruit/vegetables daily and drink plenty of water and avoid sweet beverages.   6. Sleep-disordered breathing  - Nocturnal  polysomnography (NPSG); Future  I have reviewed this encounter including the documentation in this note and/or discussed this patient with the Johney Maine, FNP, NP-C. I am certifying that I agree with the content of this note as supervising physician.  [name], MD [facility] 11/04/2016, 2:31 PM

## 2016-11-04 NOTE — Patient Instructions (Signed)
Preventive Care 40-64 Years, Male Preventive care refers to lifestyle choices and visits with your health care provider that can promote health and wellness. What does preventive care include?  A yearly physical exam. This is also called an annual well check.  Dental exams once or twice a year.  Routine eye exams. Ask your health care provider how often you should have your eyes checked.  Personal lifestyle choices, including:  Daily care of your teeth and gums.  Regular physical activity.  Eating a healthy diet.  Avoiding tobacco and drug use.  Limiting alcohol use.  Practicing safe sex.  Taking low-dose aspirin every day starting at age 50. What happens during an annual well check? The services and screenings done by your health care provider during your annual well check will depend on your age, overall health, lifestyle risk factors, and family history of disease. Counseling  Your health care provider may ask you questions about your:  Alcohol use.  Tobacco use.  Drug use.  Emotional well-being.  Home and relationship well-being.  Sexual activity.  Eating habits.  Work and work environment. Screening  You may have the following tests or measurements:  Height, weight, and BMI.  Blood pressure.  Lipid and cholesterol levels. These may be checked every 5 years, or more frequently if you are over 50 years old.  Skin check.  Lung cancer screening. You may have this screening every year starting at age 55 if you have a 30-pack-year history of smoking and currently smoke or have quit within the past 15 years.  Fecal occult blood test (FOBT) of the stool. You may have this test every year starting at age 50.  Flexible sigmoidoscopy or colonoscopy. You may have a sigmoidoscopy every 5 years or a colonoscopy every 10 years starting at age 50.  Prostate cancer screening. Recommendations will vary depending on your family history and other risks.  Hepatitis C  blood test.  Hepatitis B blood test.  Sexually transmitted disease (STD) testing.  Diabetes screening. This is done by checking your blood sugar (glucose) after you have not eaten for a while (fasting). You may have this done every 1-3 years. Discuss your test results, treatment options, and if necessary, the need for more tests with your health care provider. Vaccines  Your health care provider may recommend certain vaccines, such as:  Influenza vaccine. This is recommended every year.  Tetanus, diphtheria, and acellular pertussis (Tdap, Td) vaccine. You may need a Td booster every 10 years.  Varicella vaccine. You may need this if you have not been vaccinated.  Zoster vaccine. You may need this after age 60.  Measles, mumps, and rubella (MMR) vaccine. You may need at least one dose of MMR if you were born in 1957 or later. You may also need a second dose.  Pneumococcal 13-valent conjugate (PCV13) vaccine. You may need this if you have certain conditions and have not been vaccinated.  Pneumococcal polysaccharide (PPSV23) vaccine. You may need one or two doses if you smoke cigarettes or if you have certain conditions.  Meningococcal vaccine. You may need this if you have certain conditions.  Hepatitis A vaccine. You may need this if you have certain conditions or if you travel or work in places where you may be exposed to hepatitis A.  Hepatitis B vaccine. You may need this if you have certain conditions or if you travel or work in places where you may be exposed to hepatitis B.  Haemophilus influenzae type b (Hib)   vaccine. You may need this if you have certain risk factors. Talk to your health care provider about which screenings and vaccines you need and how often you need them. This information is not intended to replace advice given to you by your health care provider. Make sure you discuss any questions you have with your health care provider. Document Released: 07/18/2015  Document Revised: 03/10/2016 Document Reviewed: 04/22/2015 Elsevier Interactive Patient Education  2017 [employer].

## 2016-11-05 ENCOUNTER — Encounter: Payer: Self-pay | Admitting: Family Medicine

## 2016-11-05 DIAGNOSIS — R7303 Prediabetes: Secondary | ICD-10-CM | POA: Insufficient documentation

## 2016-11-05 HISTORY — DX: Prediabetes: R73.03

## 2016-11-05 LAB — HEMOGLOBIN A1C
Hgb A1c MFr Bld: 5.7 % — ABNORMAL HIGH (ref <5.7)
Mean Plasma Glucose: 117 mg/dL

## 2016-11-05 NOTE — Progress Notes (Signed)
Please call the patient and let him know that his cholesterol and metabolic panel look great, but his A1C is just slightly in the pre-diabetic range.  Encourage him to really work on the lifestyle modifications that we discussed at his visit.  Please schedule him for a 6 month follow up visit so that we may re-check his A1C.  Thank you!

## 2017-05-30 ENCOUNTER — Ambulatory Visit (INDEPENDENT_AMBULATORY_CARE_PROVIDER_SITE_OTHER): Payer: Self-pay | Admitting: Family Medicine

## 2017-05-30 ENCOUNTER — Encounter: Payer: Self-pay | Admitting: Family Medicine

## 2017-05-30 VITALS — BP 134/90 | HR 58 | Temp 98.1°F | Resp 16 | Ht 65.0 in | Wt 200.4 lb

## 2017-05-30 DIAGNOSIS — R030 Elevated blood-pressure reading, without diagnosis of hypertension: Secondary | ICD-10-CM

## 2017-05-30 DIAGNOSIS — G44209 Tension-type headache, unspecified, not intractable: Secondary | ICD-10-CM

## 2017-05-30 DIAGNOSIS — G253 Myoclonus: Secondary | ICD-10-CM

## 2017-05-30 DIAGNOSIS — M542 Cervicalgia: Secondary | ICD-10-CM

## 2017-05-30 DIAGNOSIS — F439 Reaction to severe stress, unspecified: Secondary | ICD-10-CM

## 2017-05-30 DIAGNOSIS — Z23 Encounter for immunization: Secondary | ICD-10-CM

## 2017-05-30 NOTE — Progress Notes (Signed)
Name: Jerry [name]   MRN: [id number]    DOB: 22-Jun-1971   Date:05/30/2017       Progress Note  Subjective  Chief Complaint  Chief Complaint  Patient presents with  . Tremors    Onset-couple of weeks, left arm pain-crampings with certain positions, headaches. His left arm will feel weak at times  . Headache    HPI  Myoclonus: he noticed muscle twitching on left index finger and now he also states happens on biceps area. It is more noticeable with rest, occasionally left arm feels weak - when pulling his socks.  Neck pain: he noticed some neck pain, went to chiropractor a couple of months ago and was told he had disc disease, he states neck pain helped with manipulation but symptoms returned after sessions ended. Denies tingling or numbness on left arm. Neck pain is aching like. Intermittent, about twice a week.   Headache: he has noticed headache , dull ache on nuchal area or temporal area, lasts a couple of hours per day, no nausea, vomiting or weakness. A few times a week.   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 His bp is elevated today, but usually at goal, discussed DASH diet and monitor for now.    Patient Active Problem List   Diagnosis Date Noted  . Prediabetes 11/05/2016  . Family history of diabetes mellitus 11/04/2016    Past Surgical History:  Procedure Laterality Date  . MASS EXCISION Right 11/26/2014   Procedure: EXCISION MASS/GROIN EXCISION MASS;  Surgeon: Robert Bellow, MD;  Location: [facility] ORS;  Service: General;  Laterality: Right;  . MASS EXCISION Right 11/26/2014   Procedure: MINOR EXCISION OF MASS/POST THIGH MASS;  Surgeon: Robert Bellow, MD;  Location: [facility] ORS;  Service: General;  Laterality: Right;  . NO PAST SURGERIES      Family History  Problem Relation Age of Onset  . Diabetes Father   . Diabetes Mother     Social History   Socioeconomic History  .  Marital status: Married    Spouse name: Not on file  . Number of children: Not on file  . Years of education: Not on file  . Highest education level: Not on file  Social Needs  . Financial resource strain: Not on file  . Food insecurity - worry: Not on file  . Food insecurity - inability: Not on file  . Transportation needs - medical: Not on file  . Transportation needs - non-medical: Not on file  Occupational History  . Not on file  Tobacco Use  . Smoking status: Never Smoker  . Smokeless tobacco: Never Used  Substance and Sexual Activity  . Alcohol use: No    Alcohol/week: 0.0 oz  . Drug use: No  . Sexual activity: Not on file  Other Topics Concern  . Not on file  Social History Narrative  . Not on file     Current Outpatient Medications:  .  Fexofenadine HCl (ALLERGY 24-HR PO), Take 1 tablet by mouth daily., Disp: , Rfl:   Allergies  Allergen Reactions  . Crab [Shellfish Allergy] Rash     ROS  Constitutional: Negative for fever or weight change.  Respiratory: Negative for cough and shortness of breath.   Cardiovascular: Negative for chest pain or palpitations.  Gastrointestinal: Negative for abdominal pain, no bowel changes.  Musculoskeletal: Negative for gait problem or joint swelling.  Skin: Negative for rash.  Neurological: Negative for dizziness or headache.  No other specific complaints in a complete review of systems (except as listed in HPI above).  Objective  Vitals:   05/30/17 1547  BP: (!) 146/92  Pulse: (!) 58  Resp: 16  Temp: 98.1 F (36.7 C)  TempSrc: Oral  SpO2: 99%  Weight: 200 lb 6.4 oz (90.9 kg)  Height: 5\' 5"  (1.651 m)    Body mass index is 33.35 kg/m.  Physical Exam  Constitutional: Patient appears well-developed and well-nourished. Obese  No distress.  HEENT: head atraumatic, normocephalic, pupils equal and reactive to light,  neck supple, throat within normal limits Cardiovascular: Normal rate, regular rhythm and normal  heart sounds.  No murmur heard. No BLE edema. Pulmonary/Chest: Effort normal and breath sounds normal. No respiratory distress. Abdominal: Soft.  There is no tenderness. Psychiatric: Patient has a normal mood and affect. behavior is normal. Judgment and thought content normal. Muscular Skeletal: normal rom of shoulder, normal neck, myoclonus observed on left index finger Neurological exam: Romberg negative, normal strength, normal sensation  PHQ2/9: Depression screen [facility] 2/9 05/30/2017 11/04/2016  Decreased Interest 0 0  Down, Depressed, Hopeless 0 0  PHQ - 2 Score 0 0     Fall Risk: Fall Risk  05/30/2017 11/04/2016  Falls in the past year? No No     Functional Status Survey: Is the patient deaf or have difficulty hearing?: No Does the patient have difficulty seeing, even when wearing glasses/contacts?: No Does the patient have difficulty concentrating, remembering, or making decisions?: No Does the patient have difficulty walking or climbing stairs?: No Does the patient have difficulty dressing or bathing?: No Does the patient have difficulty doing errands alone such as visiting a doctor's office or shopping?: No    Assessment & Plan  1. Myoclonus  - NCV with EMG(electromyography); Future  2. Neck pain  - NCV with EMG(electromyography); Future  3. Elevated blood pressure reading  He is stressed out today, sister went to [facility] last night, hospitalized.   4. Needs flu shot  - Flu Vaccine QUAD 36+ mos IM  5. Tension headache  Likely from increase in stress  6. Stress  Waiting for short sale of his house, still does not have a full time job [employer] 6

## 2017-07-28 ENCOUNTER — Telehealth: Payer: Self-pay

## 2017-07-28 ENCOUNTER — Other Ambulatory Visit: Payer: Self-pay

## 2017-07-28 ENCOUNTER — Encounter: Payer: Self-pay | Admitting: Family Medicine

## 2017-07-28 ENCOUNTER — Ambulatory Visit: Payer: Medicaid Other | Admitting: Family Medicine

## 2017-07-28 VITALS — BP 120/80 | HR 74 | Resp 14 | Ht 65.0 in | Wt 201.7 lb

## 2017-07-28 DIAGNOSIS — G253 Myoclonus: Secondary | ICD-10-CM

## 2017-07-28 DIAGNOSIS — M542 Cervicalgia: Secondary | ICD-10-CM

## 2017-07-28 DIAGNOSIS — M62542 Muscle wasting and atrophy, not elsewhere classified, left hand: Secondary | ICD-10-CM

## 2017-07-28 DIAGNOSIS — L509 Urticaria, unspecified: Secondary | ICD-10-CM

## 2017-07-28 DIAGNOSIS — R0683 Snoring: Secondary | ICD-10-CM

## 2017-07-28 DIAGNOSIS — Z91018 Allergy to other foods: Secondary | ICD-10-CM

## 2017-07-28 MED ORDER — EPINEPHRINE 0.3 MG/0.3ML IJ SOAJ: 0.3000 mg | Freq: Once | INTRAMUSCULAR | 1 refills | Status: AC

## 2017-07-28 NOTE — Telephone Encounter (Signed)
I contacted this patient to inform him that he has been scheduled to have his MRI on Wednesday, August 03, 2017 @ 3:00pm at the [facility]. Patient was instructed to arrive 15 mins early.

## 2017-07-28 NOTE — Progress Notes (Signed)
Name: Jerry [name]   MRN: [id number]    DOB: 05-12-1971   Date:07/28/2017       Progress Note  Subjective  Chief Complaint  Chief Complaint  Patient presents with  . Arm Pain  . Sleeping Problem    HPI  Myoclonus: he noticed muscle twitching on left index finger and now he also states happens on biceps area. It is more noticeable with rest, occasionally left arm feels weak - when pulling his socks. He has a history of chronic neck pain, also atrophy on left hand, feels weaker on the left arm.  Neck pain: he has a long history of neck pain, left hand weakness and atrophy, we ordered a NCS/EMG on his last visit but he did not get a phone call about it. He has nuchal pain at times.   Snoring: wife is concerned about his breathing pattern at night, also wakes up with a headache ESS of 10, needs sleep study   Hives: he has a history of shelfish allergy and ate fish last week, he woke up hours later itching all over and hives, no difficulty breathing or wheezing. He took Benadryl for a few days and symptoms resolved, discussed epi pen. Advised to avoid all type of fish for now, also discussed referral to immunologist but he would like to hold off for now.   Patient Active Problem List   Diagnosis Date Noted  . Prediabetes 11/05/2016  . Family history of diabetes mellitus 11/04/2016    Past Surgical History:  Procedure Laterality Date  . MASS EXCISION Right 11/26/2014   Procedure: EXCISION MASS/GROIN EXCISION MASS;  Surgeon: Robert Bellow, MD;  Location: [facility] ORS;  Service: General;  Laterality: Right;  . MASS EXCISION Right 11/26/2014   Procedure: MINOR EXCISION OF MASS/POST THIGH MASS;  Surgeon: Robert Bellow, MD;  Location: [facility] ORS;  Service: General;  Laterality: Right;  . NO PAST SURGERIES      Family History  Problem Relation Age of Onset  . Diabetes Father   . Diabetes Mother     Social History   Socioeconomic History  . Marital status: Married    Spouse name:  Not on file  . Number of children: 3  . Years of education: Not on file  . Highest education level: Not on file  Social Needs  . Financial resource strain: Not on file  . Food insecurity - worry: Not on file  . Food insecurity - inability: Not on file  . Transportation needs - medical: Not on file  . Transportation needs - non-medical: Not on file  Occupational History  . Occupation: Mining engineer    Comment: self employed  Tobacco Use  . Smoking status: Never Smoker  . Smokeless tobacco: Never Used  Substance and Sexual Activity  . Alcohol use: No    Alcohol/week: 0.0 oz  . Drug use: No  . Sexual activity: Not on file  Other Topics Concern  . Not on file  Social History Narrative  . Not on file     Current Outpatient Medications:  .  EPINEPHrine (EPIPEN 2-PAK) 0.3 mg/0.3 mL IJ SOAJ injection, Inject 0.3 mLs (0.3 mg total) into the muscle once for 1 dose., Disp: 2 Device, Rfl: 1 .  Fexofenadine HCl (ALLERGY 24-HR PO), Take 1 tablet by mouth daily., Disp: , Rfl:   Allergies  Allergen Reactions  . Crab [Shellfish Allergy] Rash     ROS  Constitutional: Negative for fever or weight change.  Respiratory: Negative  for cough and shortness of breath.   Cardiovascular: Negative for chest pain or palpitations.  Gastrointestinal: Negative for abdominal pain, no bowel changes.  Musculoskeletal: Negative for gait problem or joint swelling.  Skin: Positive for intermittent hives  Neurological: Negative for dizziness , positive for intermittent  headache.  No other specific complaints in a complete review of systems (except as listed in HPI above).  Objective  Vitals:   07/28/17 1048  BP: 120/80  Pulse: 74  Resp: 14  SpO2: 98%  Weight: 201 lb 11.2 oz (91.5 kg)  Height: 5\' 5"  (1.651 m)    Body mass index is 33.56 kg/m.  Physical Exam  Constitutional: Patient appears well-developed and well-nourished. Obese No distress.  HEENT: head atraumatic, normocephalic, pupils  equal and reactive to light,  neck supple, throat within normal limits Cardiovascular: Normal rate, regular rhythm and normal heart sounds.  No murmur heard. No BLE edema. Pulmonary/Chest: Effort normal and breath sounds normal. No respiratory distress. Abdominal: Soft.  There is no tenderness. Psychiatric: Patient has a normal mood and affect. behavior is normal. Judgment and thought content normal. Muscular Skeletal: atrophy left hand, tenar area is worse, grip 5/5 but weaker on left hand than on the right.   PHQ2/9: Depression screen [facility] 2/9 05/30/2017 11/04/2016  Decreased Interest 0 0  Down, Depressed, Hopeless 0 0  PHQ - 2 Score 0 0     Fall Risk: Fall Risk  07/28/2017 05/30/2017 11/04/2016  Falls in the past year? No No No     Assessment & Plan  1. Hives   2. Food allergy  - EPINEPHrine (EPIPEN 2-PAK) 0.3 mg/0.3 mL IJ SOAJ injection; Inject 0.3 mLs (0.3 mg total) into the muscle once for 1 dose.  Dispense: 2 Device; Refill: 1  3. Myoclonus  - NCV with EMG(electromyography); Future - MR Cervical Spine Wo Contrast; Future  4. Atrophy of left hand muscles  - NCV with EMG(electromyography); Future - MR Cervical Spine Wo Contrast; Future  5. Cervicalgia  - NCV with EMG(electromyography); Future - MR Cervical Spine Wo Contrast; Future   6. Snoring  ESS of 10, needs sleep study

## 2017-08-02 ENCOUNTER — Ambulatory Visit: Payer: Medicaid Other | Attending: Neurology

## 2017-08-02 DIAGNOSIS — R0683 Snoring: Secondary | ICD-10-CM | POA: Insufficient documentation

## 2017-08-03 ENCOUNTER — Ambulatory Visit: Admission: RE | Admit: 2017-08-03 | Payer: Medicaid Other | Source: Ambulatory Visit

## 2017-08-04 ENCOUNTER — Encounter: Payer: Self-pay | Admitting: Family Medicine

## 2017-08-22 NOTE — Progress Notes (Deleted)
Erroneous Encounter

## 2017-09-05 ENCOUNTER — Telehealth: Payer: Self-pay | Admitting: Family Medicine

## 2017-09-05 NOTE — Telephone Encounter (Unsigned)
Copied from [place]. Topic: Quick Communication - See Telephone Encounter >> Sep 05, 2017  3:41 PM Percell Belt A wrote: CRM for notification. See Telephone encounter for:    pt would like someone to call him about the sleep study he had in [place].  He stated that he never got results   Best number [phone]   09/05/17.

## 2017-09-05 NOTE — Telephone Encounter (Signed)
It was negative for significant sleep apnea. He needs to follow up and review studies with me. They recommended weight loss, sometimes seeing ENT may be helpful

## 2017-09-06 NOTE — Telephone Encounter (Signed)
Patient notified and states he told Dr. Ancil Boozer on his last visit he is going to lose 5 pounds by the next time he sees her. Patient is trying to eat healthier and lose weight to help with his sleep regime.

## 2017-11-07 ENCOUNTER — Encounter: Payer: Medicaid Other | Admitting: Family Medicine

## 2018-02-15 ENCOUNTER — Encounter: Payer: Medicaid Other | Admitting: Family Medicine

## 2018-03-03 ENCOUNTER — Telehealth: Payer: Self-pay

## 2018-03-03 NOTE — Telephone Encounter (Signed)
Copied from [place] [id number]. Topic: General - Other >> Mar 02, 2018  2:39 PM Mcneil, Ja-Kwan wrote: Reason for CRM: Pt states he was told he needed to get an MRI on his left arm but he can not remember the locations that were offered. Pt requests a call back. Cb# [phone]

## 2018-03-03 NOTE — Telephone Encounter (Signed)
Gave him the scheduling phone number [phone] to call and schedule and they will be able to give him a list of all locations.

## 2018-04-28 ENCOUNTER — Encounter

## 2018-04-28 ENCOUNTER — Encounter: Payer: Self-pay | Admitting: Family Medicine

## 2018-04-28 ENCOUNTER — Ambulatory Visit: Payer: Medicaid Other | Admitting: Family Medicine

## 2018-04-28 DIAGNOSIS — Z114 Encounter for screening for human immunodeficiency virus [HIV]: Secondary | ICD-10-CM

## 2018-04-28 DIAGNOSIS — M62542 Muscle wasting and atrophy, not elsewhere classified, left hand: Secondary | ICD-10-CM

## 2018-04-28 DIAGNOSIS — R739 Hyperglycemia, unspecified: Secondary | ICD-10-CM

## 2018-04-28 DIAGNOSIS — Z13220 Encounter for screening for lipoid disorders: Secondary | ICD-10-CM

## 2018-04-28 DIAGNOSIS — Z1159 Encounter for screening for other viral diseases: Secondary | ICD-10-CM

## 2018-04-28 DIAGNOSIS — M542 Cervicalgia: Secondary | ICD-10-CM

## 2018-04-28 DIAGNOSIS — Z23 Encounter for immunization: Secondary | ICD-10-CM

## 2018-04-28 DIAGNOSIS — E669 Obesity, unspecified: Secondary | ICD-10-CM

## 2018-04-28 DIAGNOSIS — G253 Myoclonus: Secondary | ICD-10-CM

## 2018-04-28 DIAGNOSIS — R634 Abnormal weight loss: Secondary | ICD-10-CM

## 2018-04-28 NOTE — Progress Notes (Signed)
Name: Jerry [name]   MRN: [id number]    DOB: 1970-12-19   Date:04/28/2018       Progress Note  Subjective  Chief Complaint  Chief Complaint  Patient presents with  . Annual Exam  . Immunizations    flu shot  . Labs Only  . Arm Pain    left arm    HPI  Muscle Waisting and weakness left arm: about one year ago he noticed myoclonus on both arms, however he is now very weak on left han, had atrophy of thenar and hypothenar aspects of left hand, also left forearm is smaller than right. He has also noticed inability to extend left index finger , denies numbness or tingling. He has left arm cramps sometimes on right side, no problems with shoulder movement but only occasionally has neck pain and stiffness. He has a history of meningitis in 2003 that required iv antibiotics. He denies bowel or bladder incontinence, but has noticed some urinary urgency . He has a history of headaches, it happens about once a month , resolves with a nap   Obesity with significant weight loss: he has lost 19 lbs in the past year, he states only change was level of activity. 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 He states appetite is normal  Pre-diabetes: previous hgbA1C was 5.7%, he has lost weight, we will recheck labs. He denies polyphagia, polydipsia but has noticed slightly increase in urinary frequency lately.    Patient Active Problem List   Diagnosis Date Noted  . Prediabetes 11/05/2016  . Family history of diabetes mellitus 11/04/2016    Past Surgical History:  Procedure Laterality Date  . MASS EXCISION Right 11/26/2014   Procedure: EXCISION MASS/GROIN EXCISION MASS;  Surgeon: Robert Bellow, MD;  Location: [facility] ORS;  Service: General;  Laterality: Right;  . MASS EXCISION Right 11/26/2014   Procedure: MINOR EXCISION OF MASS/POST THIGH MASS;  Surgeon: Robert Bellow, MD;  Location: [facility] ORS;  Service: General;  Laterality: Right;  . NO PAST SURGERIES      Family  History  Problem Relation Age of Onset  . Diabetes Father   . Diabetes Mother   . Diabetes Sister   . Hypertension Sister   . Diabetes Brother   . Diabetes Sister   . Fibromyalgia Sister   . Diabetes Sister     Social History   Socioeconomic History  . Marital status: Married    Spouse name: [name]  . Number of children: 3  . Years of education: Not on file  . Highest education level: High school graduate  Occupational History  . Occupation: Mining engineer    Comment: self employed  . Occupation: Ship broker    Comment: [employer]  . Financial resource strain: Somewhat hard  . Food insecurity:    Worry: Never true    Inability: Never true  . Transportation needs:    Medical: No    Non-medical: No  Tobacco Use  . Smoking status: Never Smoker  . Smokeless tobacco: Never Used  Substance and Sexual Activity  . Alcohol use: No    Alcohol/week: 0.0 standard drinks  . Drug use: No  . Sexual activity: Yes    Partners: Female  Lifestyle  . Physical activity:    Days per week: 1 day    Minutes per session: 60 min  . Stress: Not at all  Relationships  . Social connections:    Talks on  phone: More than three times a week    Gets together: More than three times a week    Attends religious service: More than 4 times per year    Active member of club or organization: Yes    Attends meetings of clubs or organizations: More than 4 times per year    Relationship status: Married  . Intimate partner violence:    Fear of current or ex partner: No    Emotionally abused: No    Physically abused: No    Forced sexual activity: No  Other Topics Concern  . Not on file  Social History Narrative   Patient and wife is currently in [employer] school      Current Outpatient Medications:  .  Fexofenadine HCl (ALLERGY 24-HR PO), Take 1 tablet by mouth daily., Disp: , Rfl:   Allergies  Allergen Reactions  . Crab [Shellfish Allergy] Rash    I personally reviewed  active problem list, medication list, allergies, family history, social history, health maintenance with the patient/caregiver today.   ROS  Constitutional: Negative for fever , positive for weight change.  Respiratory: Negative for cough and shortness of breath.   Cardiovascular: Negative for chest pain or palpitations.  Gastrointestinal: Negative for abdominal pain, no bowel changes.  Musculoskeletal: Negative for gait problem or joint swelling.  Skin: Negative for rash.  Neurological: Negative for dizziness , positive for intermittent  headache.  No other specific complaints in a complete review of systems (except as listed in HPI above).   Objective  Vitals:   04/28/18 1544  Pulse: 73  Resp: 16  Temp: 98.2 F (36.8 C)  TempSrc: Oral  SpO2: 99%  Weight: 183 lb 3.2 oz (83.1 kg)  Height: 5\' 5"  (1.651 m)    Body mass index is 30.49 kg/m.  Physical Exam  Constitutional: Patient appears well-developed and well-nourished. Obese  No distress.  HEENT: head atraumatic, normocephalic, pupils equal and reactive to light, neck supple, throat within normal limits Cardiovascular: Normal rate, regular rhythm and normal heart sounds.  No murmur heard. No BLE edema. Pulmonary/Chest: Effort normal and breath sounds normal. No respiratory distress. Abdominal: Soft.  There is no tenderness. Muscular skeletal/neuro: atrophy left arm, hand atrophy and left index finger is flexed and unable to extend it on his own.  Psychiatric: Patient has a normal mood and affect. behavior is normal. Judgment and thought content normal.   PHQ2/9: Depression screen [facility] 2/9 04/28/2018 04/28/2018 05/30/2017 11/04/2016  Decreased Interest 0 0 0 0  Down, Depressed, Hopeless 0 0 0 0  PHQ - 2 Score 0 0 0 0  Altered sleeping 0 - - -  Tired, decreased energy 1 - - -  Change in appetite 0 - - -  Feeling bad or failure about yourself  0 - - -  Trouble concentrating 0 - - -  Moving slowly or fidgety/restless 0 -  - -  Suicidal thoughts 0 - - -  PHQ-9 Score 1 - - -  Difficult doing work/chores Not difficult at all - - -    Fall Risk: Fall Risk  04/28/2018 07/28/2017 05/30/2017 11/04/2016  Falls in the past year? No No No No     Functional Status Survey: Is the patient deaf or have difficulty hearing?: No Does the patient have difficulty seeing, even when wearing glasses/contacts?: No Does the patient have difficulty concentrating, remembering, or making decisions?: No Does the patient have difficulty walking or climbing stairs?: No Does the patient have difficulty dressing  or bathing?: No Does the patient have difficulty doing errands alone such as visiting a doctor's office or shopping?: No   Assessment & Plan  1. Muscle wasting and atrophy, not elsewhere classified, left hand   - Ambulatory referral to Orthopedic Surgery  2. Need for influenza vaccination  - Flu Vaccine QUAD 6+ mos PF IM (Fluarix Quad PF)  3. Need for hepatitis C screening test  - Hepatitis C Antibody  4. Hyperglycemia  - Hemoglobin A1c - EKG 12-Lead  5. Obesity (BMI 30.0-34.9)  - Lipid panel - EKG 12-Lead  6. Lipid screening  - Lipid panel  7. Encounter for screening for HIV  - HIV Antibody (routine testing w rflx)  8. Neck pain  - Ambulatory referral to Orthopedic Surgery  9. Weight loss  - CBC with Differential/Platelet - COMPLETE METABOLIC PANEL WITH GFR - Sedimentation rate - C-reactive protein - TSH  10. Myoclonus  - Flu Vaccine QUAD 6+ mos PF IM (Fluarix Quad PF)

## 2018-04-30 LAB — CBC WITH DIFFERENTIAL/PLATELET
Basophils Absolute: 30 cells/uL (ref 0–200)
Basophils Relative: 0.6 %
Eosinophils Absolute: 190 cells/uL (ref 15–500)
Eosinophils Relative: 3.8 %
HEMATOCRIT: 41.6 % (ref 38.5–50.0)
Hemoglobin: 14 g/dL (ref 13.2–17.1)
LYMPHS ABS: 1405 {cells}/uL (ref 850–3900)
MCH: 27.7 pg (ref 27.0–33.0)
MCHC: 33.7 g/dL (ref 32.0–36.0)
MCV: 82.4 fL (ref 80.0–100.0)
MPV: 12.1 fL (ref 7.5–12.5)
Monocytes Relative: 6 %
NEUTROS PCT: 61.5 %
Neutro Abs: 3075 cells/uL (ref 1500–7800)
Platelets: 192 10*3/uL (ref 140–400)
RBC: 5.05 10*6/uL (ref 4.20–5.80)
RDW: 13.1 % (ref 11.0–15.0)
Total Lymphocyte: 28.1 %
WBC mixed population: 300 cells/uL (ref 200–950)
WBC: 5 10*3/uL (ref 3.8–10.8)

## 2018-04-30 LAB — HEMOGLOBIN A1C
Hgb A1c MFr Bld: 6 % of total Hgb — ABNORMAL HIGH (ref <5.7)
Mean Plasma Glucose: 126 (calc)
eAG (mmol/L): 7 (calc)

## 2018-04-30 LAB — HEPATITIS C ANTIBODY
Hepatitis C Ab: NONREACTIVE
SIGNAL TO CUT-OFF: 0.03 (ref <1.00)

## 2018-04-30 LAB — COMPLETE METABOLIC PANEL WITH GFR
AG Ratio: 1.8 (calc) (ref 1.0–2.5)
ALT: 30 U/L (ref 9–46)
AST: 26 U/L (ref 10–40)
Albumin: 4.4 g/dL (ref 3.6–5.1)
Alkaline phosphatase (APISO): 62 U/L (ref 40–115)
BUN: 14 mg/dL (ref 7–25)
CO2: 28 mmol/L (ref 20–32)
Calcium: 9.5 mg/dL (ref 8.6–10.3)
Chloride: 107 mmol/L (ref 98–110)
Creat: 1.01 mg/dL (ref 0.60–1.35)
GFR, EST AFRICAN AMERICAN: 103 mL/min/{1.73_m2} (ref >=60)
GFR, EST NON AFRICAN AMERICAN: 89 mL/min/{1.73_m2} (ref >=60)
GLOBULIN: 2.5 g/dL (ref 1.9–3.7)
GLUCOSE: 86 mg/dL (ref 65–139)
Potassium: 4 mmol/L (ref 3.5–5.3)
SODIUM: 141 mmol/L (ref 135–146)
TOTAL PROTEIN: 6.9 g/dL (ref 6.1–8.1)
Total Bilirubin: 0.4 mg/dL (ref 0.2–1.2)

## 2018-04-30 LAB — LIPID PANEL
CHOLESTEROL: 173 mg/dL (ref <200)
HDL: 58 mg/dL (ref >40)
LDL Cholesterol (Calc): 98 mg/dL (calc)
Non-HDL Cholesterol (Calc): 115 mg/dL (calc) (ref <130)
TRIGLYCERIDES: 77 mg/dL (ref <150)
Total CHOL/HDL Ratio: 3 (calc) (ref <5.0)

## 2018-04-30 LAB — C-REACTIVE PROTEIN: CRP: 2.8 mg/L (ref <8.0)

## 2018-04-30 LAB — TSH: TSH: 1.13 m[IU]/L (ref 0.40–4.50)

## 2018-04-30 LAB — SEDIMENTATION RATE: Sed Rate: 6 mm/h (ref 0–15)

## 2018-04-30 LAB — HIV ANTIBODY (ROUTINE TESTING W REFLEX): HIV 1&2 Ab, 4th Generation: NONREACTIVE

## 2018-06-14 ENCOUNTER — Ambulatory Visit: Payer: Medicaid Other | Admitting: Family Medicine

## 2018-08-04 ENCOUNTER — Encounter: Payer: Medicaid Other | Admitting: Family Medicine

## 2018-08-09 ENCOUNTER — Telehealth: Payer: Self-pay

## 2018-08-09 NOTE — Telephone Encounter (Signed)
Copied from [place] [id number]. Topic: Referral - Request for Referral >> Aug 09, 2018  8:47 AM Alanda Slim E wrote: Has patient seen PCP for this complaint? Yes  *If NO, is insurance requiring patient see PCP for this issue before PCP can refer them? Referral for which specialty: neurologist  Preferred provider/office: office that can see him sooner than March  Reason for referral: pinch nerve between left hand and left shoulder and now having issues with right arm and walk is different

## 2018-08-09 NOTE — Telephone Encounter (Signed)
A referral was made for Ortho back in October 2019, did he go? Why did he wait so long to ask to see neurologist? Has he seen anyone since October ?

## 2018-08-10 NOTE — Telephone Encounter (Signed)
Called but patient was unavailable left a voicemail to ask him those questions. I seen a Jerry [name] Neurologic appointment is in March 2020 but looks as if he wants to see someone sooner. Awaiting for him to call back regarding if he ever seen anyone else and did he ever go to Orthopedic?

## 2018-08-10 NOTE — Telephone Encounter (Signed)
Please keep trying. Thank you.

## 2018-08-11 NOTE — Telephone Encounter (Signed)
Copied from [place] [id number]. Topic: Quick Sport and exercise psychologist Patient (Clinic Use ONLY) >> Aug 10, 2018  8:37 AM Jerry [name], CMA wrote: Reason for CRM: Called but patient was unavailable left a voicemail to ask him those questions. I seen a [facility] appointment is in March 2020 but looks as if he wants to see someone sooner. Awaiting for him to call back regarding if he ever seen anyone else and did he ever go to Orthopedic? >> Aug 11, 2018 10:13 AM Jerry [name] wrote: Pt called back stating that he went to go see a chiropractor in Dec or Jan they advised him to go see a neurologist and then when he went to [facility] 21/2 weeks ago they advised him the same thing to go see a neurologist   The neurologist appt is in March and pt felt like it need to be done sooner   The reason why it had took him so long to see ortho in 2019 was because he had to pay out of pocket

## 2018-08-11 NOTE — Telephone Encounter (Signed)
His symptoms have been present for months, he can ask to go on wait list , but unlikely they will change the date otherwise. I am sorry

## 2018-08-14 NOTE — Telephone Encounter (Signed)
Patient notified and will call to be put on their waiting list at [facility].

## 2018-08-22 ENCOUNTER — Ambulatory Visit: Payer: Self-pay | Admitting: Neurology

## 2018-08-22 ENCOUNTER — Encounter: Payer: Self-pay | Admitting: Neurology

## 2018-08-22 VITALS — BP 147/95 | HR 77 | Ht 65.0 in | Wt 193.0 lb

## 2018-08-22 DIAGNOSIS — G1220 Motor neuron disease, unspecified: Secondary | ICD-10-CM

## 2018-08-22 DIAGNOSIS — R253 Fasciculation: Secondary | ICD-10-CM

## 2018-08-22 DIAGNOSIS — M62542 Muscle wasting and atrophy, not elsewhere classified, left hand: Secondary | ICD-10-CM

## 2018-08-22 NOTE — Progress Notes (Addendum)
SLEEP MEDICINE CLINIC   Provider:  Larey Seat, MD  Primary Care Physician:  [name], MD   Referring Provider: Rhona Raider , MD Orthopedics- referral for extremity weakness, progressing over 6-7 month.    Chief Complaint  Patient presents with  . New Patient (Initial Visit)    pt alone, rm 10. pt states he asked for earlier appointment than 09-13-2018 with Dr Leta Baptist. Got sleep clinic opening. He stated  that several months ago he started to loose muscle in left hand. it began to move up his left arm. his left index finger unable to fully straigthen. he then started to loose strength in right hand and arm. he states that his feels like when the left arm started. he complains of cramps in arms and legs. He states his walking has changed. there has been pain in his neck and starts to feel pulling sensation. he states no numbness in arms or legs but states numbness in fingertips  . Other    pt brought disc from C spine xrays that were completed. pt has not had MRI or EMG/NCV studies completed.     HPI:  Jerry [name] is a 49 y.o. male patient, right handed , seen here as in a referral  from Dr. Rhona Raider for  Evaluation of progressive muscle atrophy and weakness.  There is less numbness than actual loss of strength.  The patient's gait has changed and his ability to hold on with his left hand or lift anything with his left arm has been reduced.  Chief complaint according to patient : see above , muscle atrophy, fasciculations and weakness.   Previously healthy 48 year old african- [place] male patient whose family history is positive for HTN, DM.  Review of Systems: Out of a complete 14 system review, the patient complains of only the following symptoms, and all other reviewed systems are negative. Has muscle twitching, rippling through the body.     Social History   Socioeconomic History  . Marital status: Married    Spouse name: [name]  . Number of children: 3  . Years  of education: Not on file  . Highest education level: High school graduate  Occupational History  . Occupation: Mining engineer    Comment: self employed  . Occupation: Ship broker    Comment: [employer]  . Financial resource strain: Somewhat hard  . Food insecurity:    Worry: Never true    Inability: Never true  . Transportation needs:    Medical: No    Non-medical: No  Tobacco Use  . Smoking status: Never Smoker  . Smokeless tobacco: Never Used  Substance and Sexual Activity  . Alcohol use: No    Alcohol/week: 0.0 standard drinks  . Drug use: No  . Sexual activity: Yes    Partners: Female  Lifestyle  . Physical activity:    Days per week: 1 day    Minutes per session: 60 min  . Stress: Not at all  Relationships  . Social connections:    Talks on phone: More than three times a week    Gets together: More than three times a week    Attends religious service: More than 4 times per year    Active member of club or organization: Yes    Attends meetings of clubs or organizations: More than 4 times per year    Relationship status: Married  . Intimate partner violence:    Fear of current or ex partner: No  Emotionally abused: No    Physically abused: No    Forced sexual activity: No  Other Topics Concern  . Not on file  Social History Narrative   Patient and wife is currently in [employer] school     Family History  Problem Relation Age of Onset  . Diabetes Father   . Diabetes Mother   . Diabetes Sister   . Hypertension Sister   . Diabetes Brother   . Diabetes Sister   . Fibromyalgia Sister   . Diabetes Sister     Past Medical History:  Diagnosis Date  . Meningitis 2003   spinal  . Prediabetes 11/05/2016   A1C 5.7 on 11/05/16    Past Surgical History:  Procedure Laterality Date  . MASS EXCISION Right 11/26/2014   Procedure: EXCISION MASS/GROIN EXCISION MASS;  Surgeon: Robert Bellow, MD;  Location: [facility] ORS;  Service: General;  Laterality:  Right;  . MASS EXCISION Right 11/26/2014   Procedure: MINOR EXCISION OF MASS/POST THIGH MASS;  Surgeon: Robert Bellow, MD;  Location: [facility] ORS;  Service: General;  Laterality: Right;  . NO PAST SURGERIES      No current outpatient medications on file.   No current facility-administered medications for this visit.     Allergies as of 08/22/2018 - Review Complete 08/22/2018  Allergen Reaction Noted  . Crab [shellfish allergy] Rash 11/11/2014    Vitals: BP (!) 147/95   Pulse 77   Ht 5\' 5"  (1.651 m)   Wt 193 lb (87.5 kg)   BMI 32.12 kg/m  Last Weight:  Wt Readings from Last 1 Encounters:  08/22/18 193 lb (87.5 kg)   YQM:VHQI mass index is 32.12 kg/m.     Last Height:   Ht Readings from Last 1 Encounters:  08/22/18 5\' 5"  (1.651 m)    Physical exam:  General: The patient is awake, alert and appears not in acute distress. The patient is well groomed. Head: Normocephalic, atraumatic. Neck is supple.   neck circumference: 16.5 "  Cardiovascular:  Regular rate and rhythm / without murmurs or carotid bruit, and without distended neck veins. Respiratory: Lungs are clear to auscultation. Skin:  Without evidence of edema, or rash Trunk: BMI is 32 kg/m2. The patient's posture is erect  Neurologic exam : The patient is awake and alert, oriented to place and time.   Attention span & concentration ability appears normal.  Speech is fluent,  without dysarthria, dysphonia or aphasia. No dysphagia noted.  Mood and affect are worried.   Cranial nerves: Pupils are equal and briskly reactive to light. Funduscopic exam without evidence of  edema. Extraocular movements  in vertical and horizontal planes intact and without nystagmus. Visual fields by finger perimetry are intact. Hearing to finger rub intact.   Facial sensation intact to fine touch.  Facial motor strength is symmetric and tongue and uvula move midline. Shoulder shrug was symmetrical.   Motor exam: There is painless loss  of muscle tone, there is atrophy of the thenar eminence, the interdigital muscles, the wrist extensors and finger extensors.  Mr. Nyoka Cowden is able to walk and to rise from a seated position without bracing himself, he needs 4 steps to turn in either direction and there is no evidence of a foot drop.  Grip strength was significantly decreased in the left versus right.  During the exam it was also evident that there are subsets muscle fasciculations in the left ante-brachial region.  Sensory:  Fine touch, pinprick and vibration were tested  in all extremities. Proprioception tested in the upper extremities was normal.  Coordination: Rapid alternating movements in the right hand , he reports changes in handwriting . Finger-to-nose maneuver right  normal without evidence of ataxia, dysmetria or tremor. Left - weakness, pronation and grip as well as ataxia.   Gait and station: Patient walks without assistive device - Turns with 4 steps. Difficulties with climbing stairs.  Stance is stable and normal. Toe stand is unsteady. He has fallen backwards or stumbled over barriers he didn't use to. .   Deep tendon reflexes: in the  upper  extremities are symmetric and intact.  The lower extremities show hyperreflexia, almost clonic.    Assessment:  After physical and neurologic examination, review of laboratory studies,  Personal review of imaging studies, reports of other /same  Imaging studies, results of polysomnography and / or neurophysiology testing and pre-existing records as far as provided in visit., my assessment is   1) upper and lower motor neuron involvement, fasciculations, muscle atrophy, and leg hyperreflexia.   The patient is very athletic, plays basketball and gymnastics - not possible anymore- there was no injury , no trauma.  He cleans carpets and floors , and has trouble pushing the brushing/ cleaning machines.    The patient was advised of the nature of the diagnosed disorder , the treatment  options and the  risks for general health and wellness arising from not treating the condition.   I spent more than 35 minutes of face to face time with the patient.  Greater than 50% of time was spent in counseling and coordination of care. We have discussed the diagnosis and differential and I answered the patient's questions.    Plan:  Treatment plan and additional workup :  EMG and NCV- Dr Etter Sjogren already ordered Cervical spine MRI  On 07-28-2018,  and dependent on results I will referral to neuromuscular specialist.       Larey Seat, MD 5/79/0383, 3:38 PM  Certified in Neurology by ABPN Certified in Sleep Medicine by [facility] [address], [place], [place] [address]

## 2018-09-08 ENCOUNTER — Ambulatory Visit (INDEPENDENT_AMBULATORY_CARE_PROVIDER_SITE_OTHER): Payer: Self-pay | Admitting: Neurology

## 2018-09-08 ENCOUNTER — Ambulatory Visit: Payer: Medicaid Other | Admitting: Neurology

## 2018-09-08 DIAGNOSIS — R253 Fasciculation: Secondary | ICD-10-CM | POA: Insufficient documentation

## 2018-09-08 DIAGNOSIS — R531 Weakness: Secondary | ICD-10-CM | POA: Insufficient documentation

## 2018-09-08 DIAGNOSIS — G1220 Motor neuron disease, unspecified: Secondary | ICD-10-CM

## 2018-09-08 DIAGNOSIS — Z0289 Encounter for other administrative examinations: Secondary | ICD-10-CM

## 2018-09-08 DIAGNOSIS — M6281 Muscle weakness (generalized): Secondary | ICD-10-CM

## 2018-09-08 DIAGNOSIS — M62542 Muscle wasting and atrophy, not elsewhere classified, left hand: Secondary | ICD-10-CM | POA: Insufficient documentation

## 2018-09-08 NOTE — Procedures (Signed)
Full Name: Jerry [name] Gender: Male MRN #: [id number] Date of Birth: 1970-12-22    Visit Date: 09/08/2018 11:42 Age: 48 Years 26 Months Old Examining Physician: Marcial Pacas, MD  Referring Physician: Larey Seat, MD History: 48 year old male, presented with painless muscle atrophy, weakness of left hand since beginning of 2019, rapidly progressing to involving his left arm, mild involvement of right hand, recent gradual onset gait abnormality.  He denies dysarthria, dysphagia.  On examination: He has significant left hand muscle atrophy.  Motor strength (R/L) shoulder abduction 5/4, external rotation5/3, elbow flexion5/3, elbow extension 5/4, wrist extension 4/1, wrist flexion4/3, grip4/3, there is no significant bilateral proximal lower extremity muscle weakness, he has mild bilateral ankle dorsiflexion, moderate bilateral total extension weakness.  Sensory was intact to light touch, vibratory sensation.  Deep tendon reflexes were brisk, nonsustained ankle clonus, bilateral Babinski signs.  Summary of the tests:  Nerve conduction study: Bilateral radial, left sural, superficial peroneal, bilateral median, ulnar sensory responses were normal.  Left tibial, peroneal motor responses were normal. Left ulnar motor responses showed severely decreased the C map amplitude, Right ulnar motor responses showed mildly decreased the C map amplitude with normal distal latency, conduction velocity, and F-wave latency. Bilateral median motor responses also showed mild to moderately decreased the C map amplitude, with normal distal latency, conduction velocity.  Electromyography: Needle examination was performed at the upper, lower extremity muscles, left cervical, lumbar sacral paraspinal muscles, also left thoracic paraspinal muscles, left sternocleidomastoid, left genioglossus.  There is evidence of active denervation, and large complex motor unit potential noted at left cervical myotomes,   There is also occasionally muscle fasciculations.  There is milder degree of active neuropathic changes involving left lumbar sacral myotomes, there is occasionally fasciculations noted.  I was able to appreciate increased insertional activity, 1+ positive waves, at left thoracic paraspinals at T10, T11. There is evidence of mild enlarged motor unit potential, with mildly decreased recruitment at left sternocleidomastoid, left genioglossus.     Conclusion: This is a marked abnormal study.  There is electrodiagnostic evidence of widespread active neuropathic changes involving left cervical, lumbosacral myotomes.  There is also evidence of active denervation at the left thoracic paraspinal muscles; mild chronic neuropathic changes left cervical myotomes.  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  I have initiated extensive imaging study, laboratory evaluations to rule out the possibility of inflammatory, infectious, nutritional deficiency etiology.    ------------------------------- Marcial Pacas, M.D. PhD  [facility] [place], [place] [address] Tel: [phone] Fax: [phone]        [facility]    Nerve / Sites Muscle Latency Ref. Amplitude Ref. Rel Amp Segments Distance Velocity Ref. Area    ms ms mV mV %  cm m/s m/s mVms  R Median - APB     Wrist APB 3.8 ?4.4 3.2 ?4.0 100 Wrist - APB 7   10.8     Upper arm APB 8.3  2.9  89.7 Upper arm - Wrist 23 51 ?49 10.2  L Median - APB     Wrist APB 4.0 ?4.4 2.8 ?4.0 100 Wrist - APB 7   7.6     Upper arm APB 8.3  2.5  89.9 Upper arm - Wrist 22 52 ?49 7.4  R Ulnar - ADM     Wrist ADM 3.2 ?3.3 3.0 ?6.0 100 Wrist - ADM 7   9.5     B.Elbow ADM 7.4  3.0  101 B.Elbow - Wrist 21 50 ?49 9.2     A.Elbow ADM 9.5  2.8  94.8 A.Elbow - B.Elbow 10 49 ?49 9.2         A.Elbow - Wrist      L Ulnar - ADM     Wrist ADM 4.2 ?3.3 0.3 ?6.0 100 Wrist - ADM 7   0.3     B.Elbow ADM 8.9  0.2  95.8 B.Elbow - Wrist 22 47 ?49  0.3     A.Elbow ADM 11.4  0.2  95.6 A.Elbow - B.Elbow 10 40 ?49 0.3         A.Elbow - Wrist      L Peroneal - EDB     Ankle EDB 4.7 ?6.5 4.8 ?2.0 100 Ankle - EDB 9   12.6     Fib head EDB 11.8  3.6  74.7 Fib head - Ankle 37 52 ?44 11.6     Pop fossa EDB 14.1  3.4  93.7 Pop fossa - Fib head 10 44 ?44 11.1         Pop fossa - Ankle      L Tibial - AH     Ankle AH 4.7 ?5.8 5.6 ?4.0 100 Ankle - AH 9   12.5     Pop fossa AH 13.8  3.3  59.4 Pop fossa - Ankle 37 41 ?41 10.1                  SNC    Nerve / Sites Rec. Site Peak Lat Ref.  Amp Ref. Segments Distance Peak Diff Ref.    ms ms V V  cm ms ms  R Radial - Anatomical snuff box (Forearm)     Forearm Wrist 2.7 ?2.9 17 ?15 Forearm - Wrist 10    L Radial - Anatomical snuff box (Forearm)     Forearm Wrist 2.5 ?2.9 18 ?15 Forearm - Wrist 10    L Sural - Ankle (Calf)     Calf Ankle 4.0 ?4.4 16 ?6 Calf - Ankle 14    L Superficial peroneal - Ankle     Lat leg Ankle 4.1 ?4.4 8 ?6 Lat leg - Ankle 14    R Median, Ulnar - Transcarpal comparison     Median Palm Wrist 2.0 ?2.2 25 ?35 Median Palm - Wrist 8       Ulnar Palm Wrist 1.9 ?2.2 14 ?12 Ulnar Palm - Wrist 8          Median Palm - Ulnar Palm  0.1 ?0.4  L Median, Ulnar - Transcarpal comparison     Median Palm Wrist 2.1 ?2.2 42 ?35 Median Palm - Wrist 8       Ulnar Palm Wrist 2.0 ?2.2 13 ?12 Ulnar Palm - Wrist 8          Median Palm - Ulnar Palm  0.1 ?0.4  R Median - Orthodromic (Dig II, Mid palm)     Dig II Wrist 3.0 ?3.4 10 ?10 Dig II - Wrist 13    L Median - Orthodromic (Dig II, Mid palm)     Dig II Wrist 3.0 ?3.4 11 ?10 Dig II - Wrist 13    R Ulnar - Orthodromic, (Dig V, Mid palm)     Dig V Wrist 2.7 ?3.1 13 ?5 Dig V - Wrist 11    L Ulnar - Orthodromic, (Dig V, Mid palm)     Dig V Wrist 2.6 ?3.1 7 ?5 Dig V -  Wrist 24                           F  Wave    Nerve F Lat Ref.   ms ms  R Ulnar - ADM 27.7 ?32.0  L Ulnar - ADM 34.2 ?32.0  L Tibial - AH 53.6 ?56.0           EMG        EMG Summary Table    Spontaneous MUAP Recruitment  Muscle IA Fib PSW Fasc Other Amp Dur. Poly Pattern  L. Pronator teres Increased 2+ 2+ Occasional _______ Increased Increased 1+ Reduced  L. Biceps brachii Increased 1+ None Occasional _______ Increased Increased 1+ Reduced  L. Extensor digitorum communis Increased 1+ None Occasional _______ Increased Increased 1+ Reduced  L. Tibialis anterior Increased None None Occasional _______ Increased Increased Normal Reduced  L. Gastrocnemius (Medial head) Increased None None Occasional _______ Increased Increased 1+ Reduced  L. Vastus lateralis Increased None None None _______ Increased Increased 1+ Reduced  L. Sternocleidomastoid Increased None None None _______ Increased Increased Normal Reduced  L. Genioglossus Increased None None None _______ Increased Increased 1+ Reduced  R. Lumbar paraspinals (mid) Normal None None None _______ Normal Normal Normal Normal  R. Lumbar paraspinals (low) Normal None None None _______ Normal Normal Normal Normal  R. Thoracic paraspinals (low) Increased 1+ None None _______ Normal Normal Normal Normal  R. Thoracic paraspinals (mid) Increased 1+ 1+ None _______ Normal Normal Normal Normal  R. Cervical paraspinals Normal None None None _______ Normal Normal Normal Normal

## 2018-09-11 ENCOUNTER — Telehealth: Payer: Self-pay | Admitting: Neurology

## 2018-09-11 NOTE — Telephone Encounter (Signed)
self pay order sent to [facility] . They will reach out to the pt to schedule.  °

## 2018-09-11 NOTE — Progress Notes (Signed)
error 

## 2018-09-13 ENCOUNTER — Ambulatory Visit: Payer: Medicaid Other | Admitting: Diagnostic Neuroimaging

## 2018-09-22 ENCOUNTER — Encounter: Payer: Self-pay | Admitting: Neurology

## 2018-09-26 ENCOUNTER — Telehealth: Payer: Self-pay

## 2018-09-26 NOTE — Telephone Encounter (Signed)
I called pt back. He states he saw PCP in January. Last notes/labs I can see were 04/2018. He is going to call PCP to find to find out what labs were completed and call back to let us know. We can then compare to what Dr. Krista Blue ordered 09/2018 and see if she still wants him to have lab tests.

## 2018-09-26 NOTE — Telephone Encounter (Signed)
-----   Message from Arley Phenix sent at 09/26/2018 12:40 PM EDT ----- Regarding: Blood Work [facility], Above pt called questioning labs needed, he stated bw was drawn in January for PCP and needed to know if [facility] could use those labs.  Please call and advise [phone].  Thank you

## 2018-09-28 ENCOUNTER — Telehealth: Payer: Self-pay | Admitting: Family Medicine

## 2018-09-28 NOTE — Telephone Encounter (Signed)
Patient notified he did have lab work done October 2019 and some where duplicate from what the Neurologist ordered. However, they should be able to see it since we can see Dr. Krista Blue lab orders.

## 2018-09-28 NOTE — Telephone Encounter (Signed)
Copied from [place] [id number]. Topic: Quick Communication - See Telephone Encounter >> Sep 28, 2018 11:12 AM Rayann Heman wrote: CRM for notification. See Telephone encounter for: 09/28/18. Pt called and left a voice mail that states that he had blood work done in January. Pt states that the neurologist is requesting blood work and the patient would like a call back from the nurse because he does not want to double do blood work. Please advise.

## 2018-09-28 NOTE — Telephone Encounter (Signed)
Dr. Krista Blue- pt wondering if he needs all labs you ordered on 09/07/18? He had labs via PCP office 04/28/18. He wants you to review that and make sure you still want the labs you ordered on 09/07/18

## 2018-09-28 NOTE — Telephone Encounter (Signed)
Called pt. Back. He wanted to know when his last lab work was done and what labs were done - for his neurologist.

## 2018-09-28 NOTE — Telephone Encounter (Signed)
Pt called back. His labs from 04/28/18 are available on Epic. Please call to advise

## 2018-09-29 ENCOUNTER — Other Ambulatory Visit: Payer: Self-pay

## 2018-09-29 ENCOUNTER — Other Ambulatory Visit: Payer: Self-pay | Admitting: Neurology

## 2018-09-29 ENCOUNTER — Other Ambulatory Visit (INDEPENDENT_AMBULATORY_CARE_PROVIDER_SITE_OTHER): Payer: Self-pay

## 2018-09-29 DIAGNOSIS — Z0289 Encounter for other administrative examinations: Secondary | ICD-10-CM

## 2018-09-29 NOTE — Addendum Note (Signed)
Addended by: Marcial Pacas on: 09/29/2018 11:39 AM   Modules accepted: Orders

## 2018-10-02 LAB — VITAMIN B12: Vitamin B-12: 671 pg/mL (ref 232–1245)

## 2018-10-02 LAB — RPR: RPR Ser Ql: NONREACTIVE

## 2018-10-02 LAB — CK: Total CK: 516 U/L (ref 24–204)

## 2018-10-02 LAB — VITAMIN D 25 HYDROXY (VIT D DEFICIENCY, FRACTURES): Vit D, 25-Hydroxy: 10.1 ng/mL — ABNORMAL LOW (ref 30.0–100.0)

## 2018-10-02 LAB — COPPER, SERUM: Copper: 96 ug/dL (ref 72–166)

## 2018-10-02 LAB — ACETYLCHOLINE RECEPTOR, BINDING: AChR Binding Ab, Serum: 0.04 nmol/L (ref 0.00–0.24)

## 2018-10-03 ENCOUNTER — Other Ambulatory Visit: Payer: Self-pay

## 2018-10-03 ENCOUNTER — Telehealth: Payer: Self-pay | Admitting: Neurology

## 2018-10-03 ENCOUNTER — Encounter: Payer: Self-pay | Admitting: Family Medicine

## 2018-10-03 ENCOUNTER — Ambulatory Visit (INDEPENDENT_AMBULATORY_CARE_PROVIDER_SITE_OTHER): Payer: Medicaid Other | Admitting: Family Medicine

## 2018-10-03 VITALS — BP 130/80 | HR 80 | Temp 98.1°F | Resp 16 | Ht 65.0 in | Wt 193.3 lb

## 2018-10-03 DIAGNOSIS — Z125 Encounter for screening for malignant neoplasm of prostate: Secondary | ICD-10-CM | POA: Diagnosis not present

## 2018-10-03 DIAGNOSIS — E559 Vitamin D deficiency, unspecified: Secondary | ICD-10-CM

## 2018-10-03 DIAGNOSIS — R296 Repeated falls: Secondary | ICD-10-CM | POA: Diagnosis not present

## 2018-10-03 DIAGNOSIS — Z0000 Encounter for general adult medical examination without abnormal findings: Secondary | ICD-10-CM | POA: Diagnosis not present

## 2018-10-03 DIAGNOSIS — N400 Enlarged prostate without lower urinary tract symptoms: Secondary | ICD-10-CM

## 2018-10-03 MED ORDER — VITAMIN D (ERGOCALCIFEROL) 1.25 MG (50000 UNIT) PO CAPS: 50000.0000 [IU] | ORAL_CAPSULE | ORAL | 0 refills | Status: DC

## 2018-10-03 NOTE — Patient Instructions (Signed)
Preventive Care 40-64 Years, Male Preventive care refers to lifestyle choices and visits with your health care provider that can promote health and wellness. What does preventive care include?   A yearly physical exam. This is also called an annual well check.  Dental exams once or twice a year.  Routine eye exams. Ask your health care provider how often you should have your eyes checked.  Personal lifestyle choices, including: ? Daily care of your teeth and gums. ? Regular physical activity. ? Eating a healthy diet. ? Avoiding tobacco and drug use. ? Limiting alcohol use. ? Practicing safe sex. ? Taking low-dose aspirin every day starting at age 50. What happens during an annual well check? The services and screenings done by your health care provider during your annual well check will depend on your age, overall health, lifestyle risk factors, and family history of disease. Counseling Your health care provider may ask you questions about your:  Alcohol use.  Tobacco use.  Drug use.  Emotional well-being.  Home and relationship well-being.  Sexual activity.  Eating habits.  Work and work environment. Screening You may have the following tests or measurements:  Height, weight, and BMI.  Blood pressure.  Lipid and cholesterol levels. These may be checked every 5 years, or more frequently if you are over 50 years old.  Skin check.  Lung cancer screening. You may have this screening every year starting at age 55 if you have a 30-pack-year history of smoking and currently smoke or have quit within the past 15 years.  Colorectal cancer screening. All adults should have this screening starting at age 50 and continuing until age 75. Your health care provider may recommend screening at age 45. You will have tests every 1-10 years, depending on your results and the type of screening test. People at increased risk should start screening at an earlier age. Screening tests may  include: ? Guaiac-based fecal occult blood testing. ? Fecal immunochemical test (FIT). ? Stool DNA test. ? Virtual colonoscopy. ? Sigmoidoscopy. During this test, a flexible tube with a tiny camera (sigmoidoscope) is used to examine your rectum and lower colon. The sigmoidoscope is inserted through your anus into your rectum and lower colon. ? Colonoscopy. During this test, a long, thin, flexible tube with a tiny camera (colonoscope) is used to examine your entire colon and rectum.  Prostate cancer screening. Recommendations will vary depending on your family history and other risks.  Hepatitis C blood test.  Hepatitis B blood test.  Sexually transmitted disease (STD) testing.  Diabetes screening. This is done by checking your blood sugar (glucose) after you have not eaten for a while (fasting). You may have this done every 1-3 years. Discuss your test results, treatment options, and if necessary, the need for more tests with your health care provider. Vaccines Your health care provider may recommend certain vaccines, such as:  Influenza vaccine. This is recommended every year.  Tetanus, diphtheria, and acellular pertussis (Tdap, Td) vaccine. You may need a Td booster every 10 years.  Varicella vaccine. You may need this if you have not been vaccinated.  Zoster vaccine. You may need this after age 60.  Measles, mumps, and rubella (MMR) vaccine. You may need at least one dose of MMR if you were born in 1957 or later. You may also need a second dose.  Pneumococcal 13-valent conjugate (PCV13) vaccine. You may need this if you have certain conditions and have not been vaccinated.  Pneumococcal polysaccharide (PPSV23) vaccine.   You may need one or two doses if you smoke cigarettes or if you have certain conditions.  Meningococcal vaccine. You may need this if you have certain conditions.  Hepatitis A vaccine. You may need this if you have certain conditions or if you travel or work in  places where you may be exposed to hepatitis A.  Hepatitis B vaccine. You may need this if you have certain conditions or if you travel or work in places where you may be exposed to hepatitis B.  Haemophilus influenzae type b (Hib) vaccine. You may need this if you have certain risk factors. Talk to your health care provider about which screenings and vaccines you need and how often you need them. This information is not intended to replace advice given to you by your health care provider. Make sure you discuss any questions you have with your health care provider. Document Released: 07/18/2015 Document Revised: 08/11/2017 Document Reviewed: 04/22/2015 Elsevier Interactive Patient Education  2019 [employer]  

## 2018-10-03 NOTE — Telephone Encounter (Signed)
I have called patient about his laboratory evaluation, vitamin D level is 10, he should take over-the-counter D3 supplement 2000 units daily, CPK level is elevated 516, which is non specific, rest of the lab results were normal.

## 2018-10-03 NOTE — Progress Notes (Signed)
Name: Jerry [name]   MRN: [id number]    DOB: 1970-08-05   Date:10/03/2018       Progress Note  Subjective  Chief Complaint  Chief Complaint  Patient presents with  . Annual Exam    HPI  Patient presents for annual CPE   USPSTF grade A and B recommendations:  Diet: cooking at home, avoiding fast food  Exercise: just physical work   Depression: phq 9 is negative Depression screen [facility] 2/9 10/03/2018 04/28/2018 04/28/2018 05/30/2017 11/04/2016  Decreased Interest 0 0 0 0 0  Down, Depressed, Hopeless 0 0 0 0 0  PHQ - 2 Score 0 0 0 0 0  Altered sleeping 0 0 - - -  Tired, decreased energy 0 1 - - -  Change in appetite 0 0 - - -  Feeling bad or failure about yourself  0 0 - - -  Trouble concentrating 0 0 - - -  Moving slowly or fidgety/restless 0 0 - - -  Suicidal thoughts 0 0 - - -  PHQ-9 Score 0 1 - - -  Difficult doing work/chores - Not difficult at all - - -    Hypertension:  BP Readings from Last 3 Encounters:  10/03/18 130/80  08/22/18 (!) 147/95  04/28/18 (!) 148/90    Obesity: Wt Readings from Last 3 Encounters:  10/03/18 193 lb 4.8 oz (87.7 kg)  08/22/18 193 lb (87.5 kg)  04/28/18 183 lb 3.2 oz (83.1 kg)   BMI Readings from Last 3 Encounters:  10/03/18 32.17 kg/m  08/22/18 32.12 kg/m  04/28/18 30.49 kg/m     Lipids:  Lab Results  Component Value Date   CHOL 173 04/28/2018   CHOL 150 11/04/2016   Lab Results  Component Value Date   HDL 58 04/28/2018   HDL 54 11/04/2016   Lab Results  Component Value Date   LDLCALC 98 04/28/2018   LDLCALC 82 11/04/2016   Lab Results  Component Value Date   TRIG 77 04/28/2018   TRIG 71 11/04/2016   Lab Results  Component Value Date   CHOLHDL 3.0 04/28/2018   CHOLHDL 2.8 11/04/2016   No results found for: LDLDIRECT Glucose:  Glucose, Bld  Date Value Ref Range Status  04/28/2018 86 65 - 139 mg/dL Final    Comment:    .        Non-fasting reference interval .   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   1)  How often have you had a sensation of not emptying your bladder completely after you finish urinating?  0 - Not at all  2)  How often have you had to urinate again less than two hours after you finished urinating? 1 - Less than 1 time in 5  3)  How often have you found you stopped and started again several times when you urinated?  0 - Not at  all  4) How difficult have you found it to postpone urination?  0- not at all   5) How often have you had a weak urinary stream?  0 - Not at all  6) How often have you had to push or strain to begin urination?  0 - Not at all  7) How many times did you most typically get up to urinate from the time you went to bed until the time you got up in the morning?  1 - 1 time  Total score:  0-7 mildly symptomatic   8-19 moderately symptomatic   20-35 severely symptomatic    Lung cancer:   Low Dose CT Chest recommended if Age 74-80 years, 30 pack-year currently smoking OR have quit w/in 15years. Patient does not qualify.   ECG:  2019  Advanced Care Planning: A voluntary discussion about advance care planning including the explanation and discussion of advance directives.  Discussed health care proxy and Living will, and the patient was able to identify a health care proxy as wife.  Patient does not have a living will at present time.   Patient Active Problem List   Diagnosis Date Noted  . Fasciculations of  muscle 09/08/2018  . Weakness 09/08/2018  . Thenar muscle atrophy of left hand 09/08/2018  . Prediabetes 11/05/2016  . Family history of diabetes mellitus 11/04/2016    Past Surgical History:  Procedure Laterality Date  . MASS EXCISION Right 11/26/2014   Procedure: EXCISION MASS/GROIN EXCISION MASS;  Surgeon: Robert Bellow, MD;  Location: [facility] ORS;  Service: General;  Laterality: Right;  . MASS EXCISION Right 11/26/2014   Procedure: MINOR EXCISION OF MASS/POST THIGH MASS;  Surgeon: Robert Bellow, MD;  Location: [facility] ORS;  Service: General;  Laterality: Right;  . NO PAST SURGERIES      Family History  Problem Relation Age of Onset  . Diabetes Father   . Diabetes Mother   . Diabetes Sister   . Hypertension Sister   . Diabetes Brother   . Diabetes Sister   . Fibromyalgia Sister   . Diabetes Sister     Social History   Socioeconomic History  . Marital status: Married    Spouse name: [name]  . Number of children: 3  . Years of education: Not on file  . Highest education level: High school graduate  Occupational History  . Occupation: Mining engineer    Comment: self employed  . Occupation: Ship broker    Comment: [place]  . Financial resource strain: Somewhat hard  . Food insecurity:    Worry: Never true    Inability: Never true  . Transportation needs:    Medical: No    Non-medical: No  Tobacco Use  . Smoking status: Never Smoker  . Smokeless tobacco: Never Used  Substance and Sexual Activity  . Alcohol use: No    Alcohol/week: 0.0 standard drinks  . Drug use: No  . Sexual activity: Yes    Partners: Female  Lifestyle  . Physical activity:    Days per week: 1 day    Minutes per session: 60 min  . Stress: Not at all  Relationships  . Social connections:    Talks on phone: More than three times a week    Gets together: More than three times a week    Attends religious service: More than 4 times per year    Active member of club or  organization: Yes    Attends meetings  of clubs or organizations: More than 4 times per year    Relationship status: Married  . Intimate partner violence:    Fear of current or ex partner: No    Emotionally abused: No    Physically abused: No    Forced sexual activity: No  Other Topics Concern  . Not on file  Social History Narrative   Patient and wife is currently in [employer] school      Current Outpatient Medications:  Marland Kitchen  Vitamin D, Ergocalciferol, (DRISDOL) 1.25 MG (50000 UT) CAPS capsule, Take 1 capsule (50,000 Units total) by mouth every 7 (seven) days., Disp: 12 capsule, Rfl: 0  Allergies  Allergen Reactions  . Crab [Shellfish Allergy] Rash     ROS  Constitutional: Negative for fever or weight change.   Respiratory: Negative for cough and shortness of breath.   Cardiovascular: Negative for chest pain or palpitations.  Gastrointestinal: Negative for abdominal pain, no bowel changes.  Musculoskeletal: Negative for gait problem ( sometimes looses his balance ) no  joint swelling.  Skin: Negative for rash.  Neurological: Negative for dizziness or headache.  No other specific complaints in a complete review of systems (except as listed in HPI above).  Objective  Vitals:   10/03/18 0953  BP: 130/80  Pulse: 80  Resp: 16  Temp: 98.1 F (36.7 C)  TempSrc: Oral  SpO2: 98%  Weight: 193 lb 4.8 oz (87.7 kg)  Height: 5\' 5"  (1.651 m)    Body mass index is 32.17 kg/m.  Physical Exam  Constitutional: Patient appears well-developed and well-nourished. No distress.  HENT: Head: Normocephalic and atraumatic. Ears: B TMs ok, no erythema or effusion; Nose: Nose normal. Mouth/Throat: Oropharynx is clear and moist. No oropharyngeal exudate.  Eyes: Conjunctivae and EOM are normal. Pupils are equal, round, and reactive to light. No scleral icterus.  Neck: Normal range of motion. Neck supple. No JVD present. No thyromegaly present.  Cardiovascular: Normal rate, regular rhythm and  normal heart sounds.  No murmur heard. No BLE edema. Pulmonary/Chest: Effort normal and breath sounds normal. No respiratory distress. Abdominal: Soft. Bowel sounds are normal, no distension. There is no tenderness. no masses MALE GENITALIA: Normal descended testes bilaterally, no masses palpated, no hernias, no lesions, no discharge RECTAL: Prostate slightly enlarged, no masses, no rectal masses or hemorrhoids Musculoskeletal: decrease rom of left shoulder, shoulder girdle atrophy left arm, evaluated by neurologist  Neurological: he is alert and oriented to person, place, and time. No cranial nerve deficit. Speech and gait are normal.   Skin: Skin is warm and dry. No rash noted. No erythema.  Psychiatric: Patient has a normal mood and affect. behavior is normal. Judgment and thought content normal.  Recent Results (from the past 2160 hour(s))  Vitamin B12     Status: None   Collection Time: 09/29/18 11:42 AM  Result Value Ref Range   Vitamin B-12 671 232 - 1,245 pg/mL  RPR     Status: None   Collection Time: 09/29/18 11:42 AM  Result Value Ref Range   RPR Ser Ql Non Reactive Non Reactive  CK     Status: Abnormal   Collection Time: 09/29/18 11:42 AM  Result Value Ref Range   Total CK 516 (HH) 24 - 204 U/L    Comment:                **Effective October 16, 2018 the reference interval for**  Creatine Kinase, Total will be changing to:                             Age                Male          Male                          0 -  7 days         13 - 1111      1 - 777                          8 - 30 days         48 -  168      89 - 151                    1 month -  1 year         66 -  301      37 - 263                          2 - 12 years        40 -  229      56 - 198                         12 - 30 years        44 -  446      55 - 182                         64 - 21 years        45 -  439      12 - 182                         3 - 23 years        22 -  331      54 -  182                             >80 years        58 -  208      26 - 161   Copper, serum     Status: None   Collection Time: 09/29/18 11:42 AM  Result Value Ref Range   Copper 96 72 - 166 ug/dL    Comment:                                 Detection Limit = 5  VITAMIN D 25 Hydroxy (Vit-D Deficiency, Fractures)     Status: Abnormal   Collection Time: 09/29/18 11:42 AM  Result Value Ref Range   Vit D, 25-Hydroxy 10.1 (L) 30.0 - 100.0 ng/mL    Comment: Vitamin D deficiency has been defined by the Institute of Medicine and an Endocrine Society practice guideline as a level of serum 25-OH vitamin D less than 20 ng/mL (1,2). The Endocrine Society went on to further define vitamin D insufficiency as a level between  21 and 29 ng/mL (2). 1. IOM (Institute of Medicine). 2010. Dietary reference    intakes for calcium and D. [place]: The    [employer]. 2. Holick MF, Binkley , Bischoff-Ferrari HA, et al.    Evaluation, treatment, and prevention of vitamin D    deficiency: an Endocrine Society clinical practice    guideline. JCEM. 2011 Jul; 96(7):1911-30.   Acetylcholine receptor, binding     Status: None   Collection Time: 09/29/18 11:42 AM  Result Value Ref Range   AChR Binding Ab, Serum 0.04 0.00 - 0.24 nmol/L    Comment:                                Negative:   0.00 - 0.24                                Borderline: 0.25 - 0.40                                Positive:         >0.40      PHQ2/9: Depression screen [facility] 2/9 10/03/2018 04/28/2018 04/28/2018 05/30/2017 11/04/2016  Decreased Interest 0 0 0 0 0  Down, Depressed, Hopeless 0 0 0 0 0  PHQ - 2 Score 0 0 0 0 0  Altered sleeping 0 0 - - -  Tired, decreased energy 0 1 - - -  Change in appetite 0 0 - - -  Feeling bad or failure about yourself  0 0 - - -  Trouble concentrating 0 0 - - -  Moving slowly or fidgety/restless 0 0 - - -  Suicidal thoughts 0 0 - - -  PHQ-9 Score 0 1 - - -  Difficult doing work/chores  - Not difficult at all - - -   phq9 negative   Fall Risk: Fall Risk  10/03/2018 04/28/2018 07/28/2017 05/30/2017 11/04/2016  Falls in the past year? 1 No No No No  Number falls in past yr: 1 - - - -  Injury with Fall? 1 - - - -  Risk for fall due to : History of fall(s);Impaired balance/gait - - - -  Follow up Falls prevention discussed - - - -     Functional Status Survey: Is the patient deaf or have difficulty hearing?: No Does the patient have difficulty seeing, even when wearing glasses/contacts?: No Does the patient have difficulty concentrating, remembering, or making decisions?: No Does the patient have difficulty walking or climbing stairs?: Yes Does the patient have difficulty dressing or bathing?: Yes Does the patient have difficulty doing errands alone such as visiting a doctor's office or shopping?: No    Assessment & Plan   1. Adult general medical exam  - Hemoglobin A1c - COMPLETE METABOLIC PANEL WITH GFR  2. Vitamin D deficiency  - Vitamin D, Ergocalciferol, (DRISDOL) 1.25 MG (50000 UT) CAPS capsule; Take 1 capsule (50,000 Units total) by mouth every 7 (seven) days.  Dispense: 12 capsule; Refill: 0  3. Prostate cancer screening  - PSA  4. Recurrent falls  Likely secondary to neurological problem, under the care of neurologist   5. Prostate enlargement  PSA  -Prostate cancer screening and PSA options (with potential risks and benefits of testing vs not testing) were discussed along with  recent recs/guidelines. -USPSTF grade A and B recommendations reviewed with patient; age-appropriate recommendations, preventive care, screening tests, etc discussed and encouraged; healthy living encouraged; see AVS for patient education given to patient -Discussed importance of 150 minutes of physical activity weekly, eat two servings of fish weekly, eat one serving of tree nuts ( cashews, pistachios, pecans, almonds.Marland Kitchen) every other day, eat 6 servings of fruit/vegetables  daily and drink plenty of water and avoid sweet beverages.

## 2018-10-04 LAB — PSA: PSA: 0.7 ng/mL (ref <=4.0)

## 2018-10-04 LAB — COMPLETE METABOLIC PANEL WITH GFR
AG Ratio: 1.7 (calc) (ref 1.0–2.5)
ALKALINE PHOSPHATASE (APISO): 67 U/L (ref 36–130)
ALT: 22 U/L (ref 9–46)
AST: 21 U/L (ref 10–40)
Albumin: 4.6 g/dL (ref 3.6–5.1)
BUN: 10 mg/dL (ref 7–25)
CO2: 28 mmol/L (ref 20–32)
Calcium: 9.6 mg/dL (ref 8.6–10.3)
Chloride: 105 mmol/L (ref 98–110)
Creat: 0.82 mg/dL (ref 0.60–1.35)
GFR, Est African American: 123 mL/min/{1.73_m2} (ref >=60)
GFR, Est Non African American: 106 mL/min/{1.73_m2} (ref >=60)
Globulin: 2.7 g/dL (calc) (ref 1.9–3.7)
Glucose, Bld: 97 mg/dL (ref 65–99)
Potassium: 4.1 mmol/L (ref 3.5–5.3)
Sodium: 141 mmol/L (ref 135–146)
Total Bilirubin: 0.4 mg/dL (ref 0.2–1.2)
Total Protein: 7.3 g/dL (ref 6.1–8.1)

## 2018-10-04 LAB — HEMOGLOBIN A1C
HEMOGLOBIN A1C: 6 %{Hb} — AB (ref <5.7)
MEAN PLASMA GLUCOSE: 126 (calc)
eAG (mmol/L): 7 (calc)

## 2018-10-05 NOTE — Telephone Encounter (Signed)
Pt called in and would like to speak with someone about his left hand is swollen , his fingers are swollen , both his shoulders are carrying pain, and he wants to know what he can do about the swelling in his hand and not being ble to lift his shoulder at times.  He also stated when he wakes up in the morning his muscles are very tight in his right and left arm

## 2018-10-10 NOTE — Telephone Encounter (Signed)
I have talked with patient, he complains of left arm/hand swelling, especially at the end of day, improved after overnight swelling.  It is most likely positional related.  MRIs are pending.  Please change his appointment after MRIs.

## 2018-10-16 ENCOUNTER — Ambulatory Visit
Admission: RE | Admit: 2018-10-16 | Discharge: 2018-10-16 | Disposition: A | Discharge status: Home / Self Care | Payer: Self-pay | Source: Ambulatory Visit | Attending: Neurology | Admitting: Neurology

## 2018-10-16 ENCOUNTER — Other Ambulatory Visit: Payer: Self-pay

## 2018-10-16 DIAGNOSIS — M6281 Muscle weakness (generalized): Secondary | ICD-10-CM

## 2018-10-16 NOTE — Telephone Encounter (Signed)
Called and spoke to patient he is scheduled for a virtual visit 10/25/2018

## 2018-10-17 ENCOUNTER — Telehealth: Payer: Self-pay | Admitting: Neurology

## 2018-10-17 NOTE — Telephone Encounter (Signed)
Please call patient, MRI of the brain was normal, MRI of cervical spine showed mild degenerative changes, above findings would not explain his progressive muscle atrophy and weakness, please continue work-up as previously scheduled   IMPRESSION: This MRI of the cervical spine without contrast shows the following: 1.  The spinal cord appears normal. 2.  At C3-C4, there is a right disc osteophyte complex causing moderate right foraminal narrowing.  There are some encroachment upon the C4 nerve root but no definite nerve root compression. 3.  There are milder degenerative changes at C4-C5, C5-C6 and C6-C7 that do not lead to any nerve root compression. 4.   There is no spinal stenosis.Marland Kitchen

## 2018-10-17 NOTE — Telephone Encounter (Signed)
I called the patient and provided him with the MRI results below.  He verbalized understanding.  He has pending thoracic and lumbar MRI scans on 10/18/2018.

## 2018-10-18 ENCOUNTER — Ambulatory Visit
Admission: RE | Admit: 2018-10-18 | Discharge: 2018-10-18 | Disposition: A | Discharge status: Home / Self Care | Payer: Medicaid Other | Source: Ambulatory Visit | Attending: Neurology | Admitting: Neurology

## 2018-10-18 ENCOUNTER — Other Ambulatory Visit: Payer: Medicaid Other

## 2018-10-18 ENCOUNTER — Other Ambulatory Visit: Payer: Self-pay

## 2018-10-18 DIAGNOSIS — M6281 Muscle weakness (generalized): Secondary | ICD-10-CM

## 2018-10-23 ENCOUNTER — Telehealth: Payer: Self-pay | Admitting: Neurology

## 2018-10-23 NOTE — Telephone Encounter (Signed)
I called pt, advised him of these results. I reminded pt of his virtual visit appt on 10/25/18. Pt verbalized understanding of results. Pt had no questions at this time but was encouraged to call back if questions arise.

## 2018-10-23 NOTE — Telephone Encounter (Signed)
Please call patient, MRI of lumbar and thoracic spine showed no significant abnormalities.

## 2018-10-25 ENCOUNTER — Other Ambulatory Visit: Payer: Self-pay

## 2018-10-25 ENCOUNTER — Ambulatory Visit (INDEPENDENT_AMBULATORY_CARE_PROVIDER_SITE_OTHER): Payer: Self-pay | Admitting: Neurology

## 2018-10-25 ENCOUNTER — Encounter: Payer: Self-pay | Admitting: Neurology

## 2018-10-25 DIAGNOSIS — M6281 Muscle weakness (generalized): Secondary | ICD-10-CM

## 2018-10-25 DIAGNOSIS — G1220 Motor neuron disease, unspecified: Secondary | ICD-10-CM | POA: Insufficient documentation

## 2018-10-25 NOTE — Progress Notes (Signed)
PATIENT: Jerry [name] DOB: 01-18-1971  Virtual Visit via video  I connected with Jerry [name] on 10/25/18 at  by video and verified that I am speaking with the correct person using two identifiers.   I discussed the limitations, risks, security and privacy concerns of performing an evaluation and management service by video and the availability of in person appointments. I also discussed with the patient that there may be a patient responsible charge related to this service. The patient expressed understanding and agreed to proceed.  HISTORICAL  Jerry [name] is a 48 year old male, present with his wife during today's video interview, seen in request by his primary care doctor Jerry [name], for muscle weakness  He presented with painless muscle atrophy, weakness since beginning of 2019, initially involving left hand, then progressed to involving his left arm, he has difficulty making a tight fist of his left hand, now has difficulty raising left arm overhead, over the past few months, he noticed similar involvement of his right hand, mild involvement of his right arm, also mild gait abnormality,  He denies sensory changes, he denies dysarthria, no dysphagia,  I initially met him during EMG nerve conduction study on September 08, 2018, which showed normal sensory potentials, but there is evidence of widespread active neuropathic changes involving left cervical, lumbosacral myotomes.  There is also evidence of active denervation at left thoracic paraspinals, chronic neuropathic changes involving left bulbar myotomes.  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  We have initiated extensive laboratory and imaging study,  I personally reviewed MRIs in 2020s, MRI of brain normal, MRI of cervical spine, mild degenerative changes, no significant evidence of canal or foraminal narrowing,  MRI of thoracic and lumbar spine was unremarkable.  Laboratory evaluation showed low vitamin D 10, mild elevated A1c 6.0, mild elevated CPK 516, otherwise normal or negative acetylcholine receptor antibody, copper level, RPR, ESR, C-reactive protein, TSH, hepatitis C, CMP, CBC, HIV,  Observations/Objective: I have reviewed problem lists, medications, allergies.  Awake alert oriented to history taking care of conversation, no ptosis, facial were symmetric, able to use right arm and hand fairly freely, was not able to raise left arm overhead, needing help to bend left elbow, cannot make a tight fist with left hand, wide-based, mildly cautious gait, has difficulty perform tiptoe and heel walking,  Assessment and Plan: Painless progressive muscle atrophy, weakness,  Widespread active neuropathic changes involving cervical, bulbar, thoracic, lumbar sacral myotomes, hyperreflexia Babinski sign on examinations, most supportive of motor neuron disease,  Extensive imaging and laboratory evaluations failed to demonstrate treatable etiology  CT chest to rule out paraneoplastic process  Refer him to [facility]  Advise him to start the process of disability  Vitamin D deficiency   Start D3 supplement 1000 units daily  Elevated A1c  Emphasized importance of moderate exercise and   Follow Up Instructions:  3 months    I discussed the assessment and treatment plan with the patient. The patient was provided an opportunity to ask questions and all were answered. The patient agreed with the plan and demonstrated an understanding of the instructions.   The patient was advised to call back or seek an in-person evaluation if the symptoms worsen or if the condition fails to improve as anticipated.  I provided 30 minutes of non-face-to-face time during this encounter.  REVIEW OF SYSTEMS: Full 14 system review of systems performed and  notable only for as above All other review of systems were  negative.  ALLERGIES: Allergies  Allergen Reactions  . Crab [Shellfish Allergy] Rash    HOME MEDICATIONS: Current Outpatient Medications  Medication Sig Dispense Refill  . Vitamin D, Ergocalciferol, (DRISDOL) 1.25 MG (50000 UT) CAPS capsule Take 1 capsule (50,000 Units total) by mouth every 7 (seven) days. 12 capsule 0   No current facility-administered medications for this visit.     PAST MEDICAL HISTORY: Past Medical History:  Diagnosis Date  . Meningitis 2003   spinal  . Prediabetes 11/05/2016   A1C 5.7 on 11/05/16    PAST SURGICAL HISTORY: Past Surgical History:  Procedure Laterality Date  . MASS EXCISION Right 11/26/2014   Procedure: EXCISION MASS/GROIN EXCISION MASS;  Surgeon: Robert Bellow, MD;  Location: [facility] ORS;  Service: General;  Laterality: Right;  . MASS EXCISION Right 11/26/2014   Procedure: MINOR EXCISION OF MASS/POST THIGH MASS;  Surgeon: Robert Bellow, MD;  Location: [facility] ORS;  Service: General;  Laterality: Right;  . NO PAST SURGERIES      FAMILY HISTORY: Family History  Problem Relation Age of Onset  . Diabetes Father   . Diabetes Mother   . Diabetes Sister   . Hypertension Sister   . Diabetes Brother   . Diabetes Sister   . Fibromyalgia Sister   . Diabetes Sister     SOCIAL HISTORY:   Social History   Socioeconomic History  . Marital status: Married    Spouse name: [name]  . Number of children: 3  . Years of education: Not on file  . Highest education level: High school graduate  Occupational History  . Occupation: Mining engineer    Comment: self employed  . Occupation: Ship broker    Comment: [place]  . Financial resource strain: Somewhat hard  . Food insecurity:    Worry: Never true    Inability: Never true  . Transportation needs:    Medical: No    Non-medical: No  Tobacco Use  . Smoking status: Never Smoker  . Smokeless tobacco: Never Used  Substance and Sexual Activity  . Alcohol use: No     Alcohol/week: 0.0 standard drinks  . Drug use: No  . Sexual activity: Yes    Partners: Female  Lifestyle  . Physical activity:    Days per week: 1 day    Minutes per session: 60 min  . Stress: Not at all  Relationships  . Social connections:    Talks on phone: More than three times a week    Gets together: More than three times a week    Attends religious service: More than 4 times per year    Active member of club or organization: Yes    Attends meetings of clubs or organizations: More than 4 times per year    Relationship status: Married  . Intimate partner violence:    Fear of current or ex partner: No    Emotionally abused: No    Physically abused: No    Forced sexual activity: No  Other Topics Concern  . Not on file  Social History Narrative   Patient and wife is currently in [employer]     Marcial Pacas, M.D. Ph.D.  [facility] [address] [place], [place] [address] Ph: [phone] Fax: [phone]  CC: Referring Provider

## 2018-10-26 ENCOUNTER — Telehealth: Payer: Self-pay | Admitting: Neurology

## 2018-10-26 NOTE — Telephone Encounter (Signed)
Medicaid order sent to GI. They will reach out to the pt to schedule.

## 2018-11-15 ENCOUNTER — Ambulatory Visit: Payer: Self-pay

## 2018-11-15 NOTE — Telephone Encounter (Signed)
Incoming call from Patient who Complains of left arm  And Hands swelling.  Reports shoulder swelling and painful.  Unable to move left shoulder States that it co es and  goes.  Called Dr.  Ruthine Dose office.Counseling given: Not Answered  transfer call Patient made a virtual appoinpment.  * Reason for Disposition . [1] Numbness (i.e., loss of sensation) of the face, arm / hand, or leg / foot on one side of the body AND [2] gradual onset (e.g., days to weeks) AND [3] present now  Answer Assessment - Initial Assessment Questions 1. SYMPTOM: "What is the main symptom you are concerned about?" (e.g., weakness, numbness)   Muscle tigtness left hand  Swollen cant make tight ness, shouldes  2. ONSET: "When did this start?" (minutes, hours, days; while sleeping)    About 2 months  3. LAST NORMAL: "When was the last time you were normal (no symptoms)?"     2 months ago 4. PATTERN "Does this come and go, or has it been constant since it started?"  "Is it present now?"    Comes and goes 5. CARDIAC SYMPTOMS: "Have you had any of the following symptoms: chest pain, difficulty breathing, palpitations?"     Denies 6. NEUROLOGIC SYMPTOMS: "Have you had any of the following symptoms: headache, dizziness, vision loss, double vision, changes in speech, unsteady on your feet?"     Denies 7. OTHER SYMPTOMS: "Do you have any other symptoms?"     Denies 8. PREGNANCY: "Is there any chance you are pregnant?" "When was your last menstrual period?"  Protocols used: NEUROLOGIC DEFICIT-A-AH

## 2018-11-17 ENCOUNTER — Ambulatory Visit: Payer: Medicaid Other | Admitting: Family Medicine

## 2018-11-17 NOTE — Telephone Encounter (Signed)
Patient would like to know if you would like for him to keep his appointment with you today at 10:40.

## 2018-11-20 ENCOUNTER — Other Ambulatory Visit: Payer: Medicaid Other

## 2018-11-22 ENCOUNTER — Other Ambulatory Visit: Payer: Medicaid Other

## 2018-12-05 ENCOUNTER — Inpatient Hospital Stay: Admission: RE | Admit: 2018-12-05 | Payer: Medicaid Other | Source: Ambulatory Visit

## 2018-12-20 ENCOUNTER — Telehealth: Payer: Self-pay | Admitting: *Deleted

## 2018-12-20 ENCOUNTER — Telehealth: Payer: Self-pay | Admitting: Neurology

## 2018-12-20 NOTE — Telephone Encounter (Signed)
-----   Message from Marcial Pacas, MD sent at 10/25/2018  3:33 PM EDT ----- Krista Blue in 6 months

## 2018-12-20 NOTE — Telephone Encounter (Signed)
Left message requesting patient to call me back.  He has a pending appt on 12/28/2018 at 10:30am.  We need to change this appt to a virtual visit.  The patient does not have an active mychart account. When he calls back, please ask permission to convert his pending appt to a doxy.me virtual visit and send him the link.

## 2018-12-20 NOTE — Telephone Encounter (Signed)
Follow up scheduled

## 2018-12-28 ENCOUNTER — Ambulatory Visit (INDEPENDENT_AMBULATORY_CARE_PROVIDER_SITE_OTHER): Payer: Medicaid Other | Admitting: Neurology

## 2018-12-28 ENCOUNTER — Encounter: Payer: Self-pay | Admitting: Neurology

## 2018-12-28 ENCOUNTER — Telehealth: Payer: Self-pay | Admitting: Neurology

## 2018-12-28 ENCOUNTER — Other Ambulatory Visit: Payer: Self-pay

## 2018-12-28 DIAGNOSIS — G1220 Motor neuron disease, unspecified: Secondary | ICD-10-CM | POA: Diagnosis not present

## 2018-12-28 NOTE — Telephone Encounter (Signed)
I have called and talked to patient and I am waiting  on Jerry [name] to call me back from [facility] telephone [phone].

## 2018-12-28 NOTE — Progress Notes (Signed)
PATIENT: Jerry [name] DOB: 1971-05-12  Virtual Visit via video  I connected with [name] on 12/28/18 at  by video and verified that I am speaking with the correct person using two identifiers.   I discussed the limitations, risks, security and privacy concerns of performing an evaluation and management service by video and the availability of in person appointments. I also discussed with the patient that there may be a patient responsible charge related to this service. The patient expressed understanding and agreed to proceed.  HISTORICAL  Jaison Petraglia is a 48 year old male, present with his wife during today's video interview, seen in request by his primary care doctor [name], for muscle weakness  He presented with painless muscle atrophy, weakness since beginning of 2019, initially involving left hand, then progressed to involving his left arm, he has difficulty making a tight fist of his left hand, now has difficulty raising left arm overhead, over the past few months, he noticed similar involvement of his right hand, mild involvement of his right arm, also mild gait abnormality,  He denies sensory changes, he denies dysarthria, no dysphagia,  I initially met him during EMG nerve conduction study on September 08, 2018, which showed normal sensory potentials, but there is evidence of widespread active neuropathic changes involving left cervical, lumbosacral myotomes.  There is also evidence of active denervation at left thoracic paraspinals, chronic neuropathic changes involving left bulbar myotomes.  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  We have initiated extensive laboratory and imaging study,  I personally reviewed MRIs in 2020s, MRI of brain normal, MRI of cervical spine, mild degenerative changes, no significant evidence of canal or foraminal narrowing,  MRI of thoracic and lumbar spine was unremarkable.  Laboratory evaluation showed low vitamin D 10, mild elevated A1c 6.0, mild elevated CPK 516, otherwise normal or negative acetylcholine receptor antibody, copper level, RPR, ESR, C-reactive protein, TSH, hepatitis C, CMP, CBC, HIV,  UPDATE December 28 2018: He has increased right arm weakness, worsening left arm weakness, he also complains of bilateral lower extremity tightness, frequent muscle spasm, more difficulty walking. his wife also noticed that he has lost a lots of muscle mass at his shoulder and back.  He denies bulbar weakness, no dysarthria, no dysphagia, no breathing difficulties  In 2003, he was diagnosed with meninginitis, he had fever and headache for a week.   Observations/Objective: I have reviewed problem lists, medications, allergies.  Awake alert oriented to history taking care of conversation, no ptosis, facial were symmetric, able to raise right arm against gravity with mild difficulty, able to use right hand fairly freely, was not able to raise left arm overhead, needing help to bend left elbow, cannot make a tight fist with left hand, wide-based, mildly cautious gait, has difficulty perform tiptoe and heel walking,  Assessment and Plan: Painless progressive muscle atrophy, weakness,  Widespread active neuropathic changes involving cervical, bulbar, thoracic, lumbar sacral myotomes, hyperreflexia Babinski sign on examinations, most supportive of motor neuron disease,  Extensive imaging and laboratory evaluations failed to demonstrate treatable etiology  CT chest to rule out paraneoplastic process  Refer him to [facility] or [facility]  He has started process of disability  Vitamin D deficiency   Keep D3 supplement 1000 units daily  Elevated A1c  Emphasized importance of moderate exercise and diet control   Follow Up Instructions:  3 months  I discussed the assessment and treatment plan with the patient.  The patient was provided an opportunity to ask questions and all were answered. The patient agreed with the plan and demonstrated an understanding of the instructions.   The patient was advised to call back or seek an in-person evaluation if the symptoms worsen or if the condition fails to improve as anticipated.  I provided 30 minutes of non-face-to-face time during this encounter.  Marcial Pacas, M.D. Ph.D.  [facility] [place], [place] [address] Phone: [phone] Fax:      [phone]

## 2018-12-28 NOTE — Telephone Encounter (Signed)
Please follow up on his refer to [facility] or [facility]  And then get back to patient again.

## 2019-01-03 NOTE — Telephone Encounter (Signed)
Patient is scheduled for July 9 th with [facility] at 10:30 am . Patient is aware.

## 2019-01-11 DIAGNOSIS — G1221 Amyotrophic lateral sclerosis: Secondary | ICD-10-CM | POA: Insufficient documentation

## 2019-03-13 ENCOUNTER — Encounter: Payer: Self-pay | Admitting: *Deleted

## 2019-03-22 DIAGNOSIS — Z0271 Encounter for disability determination: Secondary | ICD-10-CM

## 2019-04-04 ENCOUNTER — Ambulatory Visit: Payer: Medicaid Other | Admitting: Family Medicine

## 2019-04-04 ENCOUNTER — Other Ambulatory Visit: Payer: Self-pay

## 2019-04-04 ENCOUNTER — Encounter: Payer: Self-pay | Admitting: Family Medicine

## 2019-04-04 VITALS — BP 132/78 | HR 74 | Temp 97.5°F | Resp 16 | Ht 65.0 in | Wt 190.0 lb

## 2019-04-04 DIAGNOSIS — M62541 Muscle wasting and atrophy, not elsewhere classified, right hand: Secondary | ICD-10-CM

## 2019-04-04 DIAGNOSIS — G1221 Amyotrophic lateral sclerosis: Secondary | ICD-10-CM

## 2019-04-04 DIAGNOSIS — Z23 Encounter for immunization: Secondary | ICD-10-CM

## 2019-04-04 DIAGNOSIS — M62542 Muscle wasting and atrophy, not elsewhere classified, left hand: Secondary | ICD-10-CM

## 2019-04-04 DIAGNOSIS — G4701 Insomnia due to medical condition: Secondary | ICD-10-CM

## 2019-04-04 MED ORDER — TRAZODONE HCL 50 MG PO TABS: 25.0000 mg | ORAL_TABLET | Freq: Every evening | ORAL | 0 refills | Status: DC | PRN

## 2019-04-04 NOTE — Progress Notes (Signed)
Name: Jerry [name]   MRN: [id number]    DOB: 1970-09-03   Date:04/04/2019       Progress Note  Subjective  Chief Complaint  Chief Complaint  Patient presents with  . Follow-up    6 month F/U  . Amyotrophic Lateral Sclerosis (ALS)    HPI  ALS: diagnosed July 9 th, 2020. He developed gradual weakness of left hand about one year ago, this year he was referred to neurologist and diagnosed with ALS on 01/11/2019 by Dr. Vallarie Mare Neurologist at [facility]. She was initially seen by Dr. Krista Blue, he had multiple tests MRI brain and c-spine, NCS, multiple labs and she referred him to Dr. Vallarie Mare since he specializes in [place]. He is now on disability. Since July he has noticed worsening of weakness on right side, difficulty grasping therefore unable to dress self, eat , drive, care for the house. Wife works full time, they have 3 children remote learning and explained he needs to get assistance at home for bathing, eating, changing and I will place referral to home health . He is also tripping more often and discussed PT and OT also He denies speech problems or swallowing  Problems  Insomnia: he has difficulty turning in bed during the night and is waking up about every hour , it makes him feel tired during the day.     Patient Active Problem List   Diagnosis Date Noted  . ALS (amyotrophic lateral sclerosis) ([place]) 01/11/2019  . Fasciculations of muscle 09/08/2018  . Weakness 09/08/2018  . Thenar muscle atrophy of left hand 09/08/2018  . Prediabetes 11/05/2016  . Family history of diabetes mellitus 11/04/2016    Past Surgical History:  Procedure Laterality Date  . MASS EXCISION Right 11/26/2014   Procedure: EXCISION MASS/GROIN EXCISION MASS;  Surgeon: Robert Bellow, MD;  Location: [facility] ORS;  Service: General;  Laterality: Right;  . MASS EXCISION Right 11/26/2014   Procedure: MINOR EXCISION OF MASS/POST THIGH MASS;  Surgeon: Robert Bellow, MD;  Location: [facility] ORS;  Service:  General;  Laterality: Right;  . NO PAST SURGERIES      Family History  Problem Relation Age of Onset  . Diabetes Father   . Diabetes Mother   . Diabetes Sister   . Hypertension Sister   . Diabetes Brother   . Diabetes Sister   . Fibromyalgia Sister   . Diabetes Sister     Social History   Socioeconomic History  . Marital status: Married    Spouse name: [name]  . Number of children: 3  . Years of education: Not on file  . Highest education level: High school graduate  Occupational History  . Occupation: Mining engineer    Comment: self employed  . Occupation: Ship broker    Comment: [employer]  . Financial resource strain: Somewhat hard  . Food insecurity    Worry: Never true    Inability: Never true  . Transportation needs    Medical: No    Non-medical: No  Tobacco Use  . Smoking status: Never Smoker  . Smokeless tobacco: Never Used  Substance and Sexual Activity  . Alcohol use: No    Alcohol/week: 0.0 standard drinks  . Drug use: No  . Sexual activity: Yes    Partners: Female  Lifestyle  . Physical activity    Days per week: 0 days    Minutes per session: 0 min  . Stress: Not at all  Relationships  .  Social connections    Talks on phone: More than three times a week    Gets together: More than three times a week    Attends religious service: More than 4 times per year    Active member of club or organization: Yes    Attends meetings of clubs or organizations: More than 4 times per year    Relationship status: Married  . Intimate partner violence    Fear of current or ex partner: No    Emotionally abused: No    Physically abused: No    Forced sexual activity: No  Other Topics Concern  . Not on file  Social History Narrative   Lives with wife and 3 children   Approved for disability 02/2019 for ALS      Current Outpatient Medications:  .  amoxicillin (AMOXIL) 500 MG tablet, , Disp: , Rfl:  .  baclofen (LIORESAL) 10 MG tablet, Take by  mouth., Disp: , Rfl:  .  HYDROcodone-acetaminophen (NORCO) 7.5-325 MG tablet, TK 1 T PO Q 5 TO 6 H PRF PAIN, Disp: , Rfl:  .  riluzole (RILUTEK) 50 MG tablet, Take by mouth., Disp: , Rfl:  .  Vitamin D, Ergocalciferol, (DRISDOL) 1.25 MG (50000 UT) CAPS capsule, Take 1 capsule (50,000 Units total) by mouth every 7 (seven) days., Disp: 12 capsule, Rfl: 0 .  traZODone (DESYREL) 50 MG tablet, Take 0.5-1 tablets (25-50 mg total) by mouth at bedtime as needed for sleep., Disp: 30 tablet, Rfl: 0  Allergies  Allergen Reactions  . Crab [Shellfish Allergy] Rash    I personally reviewed active problem list, medication list, allergies, family history, social history, health maintenance with the patient/caregiver today.   ROS  Constitutional: Negative for fever or weight change.  Respiratory: Negative for cough and shortness of breath.   Cardiovascular: Negative for chest pain or palpitations.  Gastrointestinal: Negative for abdominal pain, no bowel changes.  Musculoskeletal: Negative for gait problem or joint swelling. Unable to grip well, weak on both arms Skin: Negative for rash.  Neurological: Negative for dizziness or headache.  No other specific complaints in a complete review of systems (except as listed in HPI above).  Objective  Vitals:   04/04/19 0912  BP: 132/78  Pulse: 74  Resp: 16  Temp: (!) 97.5 F (36.4 C)  TempSrc: Temporal  SpO2: 96%  Weight: 190 lb (86.2 kg)  Height: 5\' 5"  (1.651 m)    Body mass index is 31.62 kg/m.  Physical Exam  Constitutional: Patient appears well-developed and well-nourished. Obese  No distress.  HEENT: head atraumatic, normocephalic, pupils equal and reactive to light Cardiovascular: Normal rate, regular rhythm and normal heart sounds.  No murmur heard. No BLE edema. Pulmonary/Chest: Effort normal and breath sounds normal. No respiratory distress. Abdominal: Soft.  There is no tenderness. Muscular Skeletal: weak on both arms, unable to  drip, shoulder girdle atrophy  Psychiatric: Patient has a normal mood and affect. behavior is normal. Judgment and thought content normal.  PHQ2/9: Depression screen [facility] 2/9 04/04/2019 10/03/2018 04/28/2018 04/28/2018 05/30/2017  Decreased Interest 0 0 0 0 0  Down, Depressed, Hopeless 0 0 0 0 0  PHQ - 2 Score 0 0 0 0 0  Altered sleeping 1 0 0 - -  Tired, decreased energy 0 0 1 - -  Change in appetite 0 0 0 - -  Feeling bad or failure about yourself  0 0 0 - -  Trouble concentrating 0 0 0 - -  Moving  slowly or fidgety/restless 0 0 0 - -  Suicidal thoughts 0 0 0 - -  PHQ-9 Score 1 0 1 - -  Difficult doing work/chores Not difficult at all - Not difficult at all - -    phq 9 is negative   Fall Risk: Fall Risk  04/04/2019 10/03/2018 04/28/2018 07/28/2017 05/30/2017  Falls in the past year? 1 1 No No No  Number falls in past yr: 1 1 - - -  Injury with Fall? 1 1 - - -  Comment Last Monday-tripped on carpet - - - -  Risk for fall due to : Impaired balance/gait;Impaired mobility History of fall(s);Impaired balance/gait - - -  Follow up - Falls prevention discussed - - -     Functional Status Survey: Is the patient deaf or have difficulty hearing?: No Does the patient have difficulty seeing, even when wearing glasses/contacts?: No Does the patient have difficulty concentrating, remembering, or making decisions?: No Does the patient have difficulty walking or climbing stairs?: Yes Does the patient have difficulty dressing or bathing?: Yes Does the patient have difficulty doing errands alone such as visiting a doctor's office or shopping?: Yes    Assessment & Plan   1. ALS (amyotrophic lateral sclerosis) ([place])  - Ambulatory referral to Home Health - Referral psychologist   2. Need for immunization against influenza  - Flu Vaccine QUAD 36+ mos IM - Ambulatory referral to [place]  3. Muscle wasting and atrophy, not elsewhere classified, left hand  - Ambulatory referral to  [place]  4. Atrophy of muscle of both hands  - Ambulatory referral to Home Health  5. Need for pneumococcal vaccination  - Pneumococcal polysaccharide vaccine 23-valent greater than or equal to 2yo subcutaneous/IM  6. Insomnia due to medical condition  - traZODone (DESYREL) 50 MG tablet; Take 0.5-1 tablets (25-50 mg total) by mouth at bedtime as needed for sleep.  Dispense: 30 tablet; Refill: 0

## 2019-04-16 ENCOUNTER — Encounter: Payer: Self-pay | Admitting: Family Medicine

## 2019-04-23 ENCOUNTER — Encounter: Payer: Self-pay | Admitting: Family Medicine

## 2019-05-01 ENCOUNTER — Encounter: Payer: Self-pay | Admitting: Family Medicine

## 2019-05-01 ENCOUNTER — Other Ambulatory Visit: Payer: Self-pay | Admitting: Family Medicine

## 2019-05-01 DIAGNOSIS — G1221 Amyotrophic lateral sclerosis: Secondary | ICD-10-CM

## 2019-05-01 DIAGNOSIS — M62542 Muscle wasting and atrophy, not elsewhere classified, left hand: Secondary | ICD-10-CM

## 2019-05-21 ENCOUNTER — Telehealth: Payer: Self-pay | Admitting: Family Medicine

## 2019-05-21 NOTE — Chronic Care Management (AMB) (Signed)
  Chronic Care Management   Outreach Note  05/21/2019 Name: Markcus Twiddy MRN: [id number] DOB: Jan 18, 1971  Sencere Vanname is a 48 y.o. year old male who is a primary care patient of [name], MD. I reached out to [name] by phone today in response to a referral sent by Mr. Gaetana Michaelis PCP, Dr. [name]     An unsuccessful telephone outreach was attempted today. The patient was referred to the case management team by for assistance with care management and care coordination.   Follow Up Plan: A HIPPA compliant phone message was left for the patient providing contact information and requesting a return call.  The care management team will reach out to the patient again over the next 7 days.  If patient returns call to provider office, please advise to call Embedded Care Management Care Guide Glenna Durand at [place], [facility], [place] Management ??[email or web address] ??[phone]

## 2019-05-25 NOTE — Chronic Care Management (AMB) (Signed)
  Chronic Care Management   Note  05/25/2019 Name: Jerry [name] MRN: [id number] DOB: [birth year]  Jerry [name] is a 48 y.o. year old male who is a primary care patient of [name], MD. I reached out to [name] by phone today in response to a referral sent by Mr. Gaetana Michaelis PCP, Dr. [name]     Mr. Heinlen was given information about Chronic Care Management services today including:  1. CCM service includes personalized support from designated clinical staff supervised by his physician, including individualized plan of care and coordination with other care providers 2. 24/7 contact phone numbers for assistance for urgent and routine care needs. 3. Service will only be billed when office clinical staff spend 20 minutes or more in a month to coordinate care. 4. Only one practitioner may furnish and bill the service in a calendar month. 5. The patient may stop CCM services at any time (effective at the end of the month) by phone call to the office staff. 6. The patient will be responsible for cost sharing (co-pay) of up to 20% of the service fee (after annual deductible is met).  Patient agreed to services and verbal consent obtained.   Follow up plan: Telephone appointment with CCM team member scheduled for:06/15/2019  Glenna Durand, LPN Health Advisor, [place] Management ??[email or web address] ??[phone]

## 2019-06-15 ENCOUNTER — Telehealth: Payer: Medicare Other

## 2019-06-19 HISTORY — PX: PORTACATH PLACEMENT: SHX2246

## 2019-06-27 ENCOUNTER — Ambulatory Visit: Payer: Medicare Other

## 2019-06-27 ENCOUNTER — Other Ambulatory Visit: Payer: Self-pay

## 2019-06-27 ENCOUNTER — Ambulatory Visit (INDEPENDENT_AMBULATORY_CARE_PROVIDER_SITE_OTHER): Payer: Medicare Other | Admitting: Family Medicine

## 2019-06-27 ENCOUNTER — Encounter: Payer: Self-pay | Admitting: Family Medicine

## 2019-06-27 VITALS — Temp 98.6°F

## 2019-06-27 DIAGNOSIS — R2681 Unsteadiness on feet: Secondary | ICD-10-CM | POA: Diagnosis not present

## 2019-06-27 DIAGNOSIS — M62541 Muscle wasting and atrophy, not elsewhere classified, right hand: Secondary | ICD-10-CM | POA: Diagnosis not present

## 2019-06-27 DIAGNOSIS — G1221 Amyotrophic lateral sclerosis: Secondary | ICD-10-CM | POA: Diagnosis not present

## 2019-06-27 DIAGNOSIS — M62542 Muscle wasting and atrophy, not elsewhere classified, left hand: Secondary | ICD-10-CM | POA: Diagnosis not present

## 2019-06-27 NOTE — Chronic Care Management (AMB) (Signed)
Care Management   Initial Visit Note    06/27/2019 Name: Jerry [name] MRN: [id number] DOB: [birth year]   Assessment: Jerry [name] is a 48 y.o. year old male who sees [name], MD for primary care. The care management team was consulted for assistance with care management and care coordination. A telephonic assessment was conducted with Jerry [name] and his spouse today.   Review of Jerry [name] status, including review of consultants reports, relevant labs and test results was conducted today. Collaboration with appropriate care team members was performed as part of the comprehensive evaluation and provision of chronic care management services.    SDOH (Social Determinants of Health) screening performed today.  Outpatient Encounter Medications as of 06/27/2019  Medication Sig  . amoxicillin (AMOXIL) 500 MG tablet   . baclofen (LIORESAL) 10 MG tablet Take by mouth.  . Edaravone (RADICAVA IV) Inject 60 mg into the vein.  Marland Kitchen HYDROcodone-acetaminophen (NORCO) 7.5-325 MG tablet TK 1 T PO Q 5 TO 6 H PRF PAIN  . riluzole (RILUTEK) 50 MG tablet Take by mouth.  . traZODone (DESYREL) 50 MG tablet Take 0.5-1 tablets (25-50 mg total) by mouth at bedtime as needed for sleep.  . Vitamin D, Ergocalciferol, (DRISDOL) 1.25 MG (50000 UT) CAPS capsule Take 1 capsule (50,000 Units total) by mouth every 7 (seven) days. (Patient not taking: Reported on 06/27/2019)   No facility-administered encounter medications on file as of 06/27/2019.      Goals Addressed            This Visit's Progress   . Chronic Disease Management       Current Barriers:  . Chronic Disease Management support related to Amyotrophic lateral sclerosis (ALS).  Case Manager Clinical Goal(s):  Marland Kitchen Over the next 90 days, patient will not be hospitalized for complications related to chronic illnesses. . Over the next 90 days, patient will take all medications as prescribed. . Over the next 90 days, patient will attend all  medical appointments as scheduled. . Over the next 90 days, patient will follow recommended safety precautions to prevent falls and injuries. . Over the next 60 days, patient will work with Chronic Care Management team to address plan for home health and long term care services.   Interventions:  . Reviewed medications. Encouraged to take medications as prescribed. Encouraged to notify provider and CCM team with concerns regarding medication management or prescription costs. Reports spouse currently prepares medications. No concerns regarding prescription costs. . Discussed home safety and fall prevention measures. Encouraged to follow measures and use assistive devices to prevent falls. . Discussed current personal care needs and plan for long term assistance. Discussed options. Reports spouse is currently serving as the primary care provider. Several referrals have been declined due to staffing and insurance restrictions. Reports submitting application for Medicaid. Provider has submitted required documents for [place]. Pending approval. . Reviewed scheduled/pending appointments and transportation needs. Encouraged to attend appointments as scheduled to prevent delays in care. Declined current need for transportation assistance.   Patient Self Care Activities:  . Attends all scheduled provider appointments . Calls pharmacy for medication refills . Calls provider office for new concerns or questions . Unable to perform ADLs independently . Unable to perform IADLs independently  Initial goal documentation         Jerry [name] was given information about Care Management services  including:  1. Care Management services include personalized support from designated clinical staff supervised by a physician, including individualized plan of  care and coordination with other care providers 2. 24/7 contact phone numbers for assistance for urgent and routine care needs. 3. The  patient may stop Care Management services at any time (effective at the end of the month) by phone call to the office staff.  Patient agreed to services and verbal consent obtained.   PLAN -The care management team will follow up with Jerry [name] to discuss pending approval for [place].    [facility] Care Management [phone]

## 2019-06-27 NOTE — Progress Notes (Signed)
Name: Jerry [name]   MRN: [id number]    DOB: [birth year]   Date:06/27/2019       Progress Note  Subjective  Chief Complaint  Chief Complaint  Patient presents with  . Follow-up    3 month follow up    I connected with  Jerry [name]  on 06/27/19 at 11:40 AM EST by a video enabled telemedicine application and verified that I am speaking with the correct person using two identifiers.  I discussed the limitations of evaluation and management by telemedicine and the availability of in person appointments. The patient expressed understanding and agreed to proceed. Staff also discussed with the patient that there may be a patient responsible charge related to this service. Patient Location: at home  Provider Location: [facility]  Additional Individuals present: wife was dressing him   HPI  ALS: diagnosed July 9 th, 2020. He developed gradual weakness of left hand in 2019  this year he was referred to neurologist and diagnosed with ALS on 01/11/2019 by Dr. Vallarie [name] Neurologist at [facility]. He  was initially seen by Dr. Krista [name], he had multiple tests MRI brain and c-spine, NCS, multiple labs and she referred him to Dr. Vallarie [name] since he specializes in [place]. He is now on disability approved 03/2019  He states since last visit with me noticing more weakness on legs , he is still able to walk but not as long or as far as he used to. Still able to use stairs - but he needs to use the wall and or rails for support . He had a recent fall, he tripped on the chase that he has in his bedroom - he states his head nipped the corner of the nightstand - but no loss of consciousness, his wife and one child had to help him get up. He has a port with double lumen so he can receive Radicava   From PT 05/23/2019 - notes from [place] of Daily Living (as reported unless otherwise noted) Barthel Index Feeding: Unable(can only do finger feeds and not  consistently) Bathing: Dependent Grooming: Needs to help with personal care Dressing: Dependent Bowels: Incontinent (or needs to be given enemas) Bladder: Incontinent, or catheterized and unable to manage alone Toilet Use: Dependent Transfers (Bed to Chair and Back): Minor help (verbal or physical) Mobility (On Level Surfaces): Walks with help of one person (verbal or physical) > 50 yards STAIRS: Needs help (verbal, physical, carrying aid) Barthel Index Total Score: 25  We are still trying to arrange for home health to assist with his care  Insomnia: he  Was having  difficulty turning in bed during the night and is waking up about every hour, he states he has been able to sleep better, even without trazodone able to sleep for about 3-4 hours in a row   Patient Active Problem List   Diagnosis Date Noted  . ALS (amyotrophic lateral sclerosis) ([place]) 01/11/2019  . Fasciculations of muscle 09/08/2018  . Weakness 09/08/2018  . Thenar muscle atrophy of left hand 09/08/2018  . Prediabetes 11/05/2016  . Family history of diabetes mellitus 11/04/2016    Past Surgical History:  Procedure Laterality Date  . MASS EXCISION Right 11/26/2014   Procedure: EXCISION MASS/GROIN EXCISION MASS;  Surgeon: Jerry Bellow, MD;  Location: [facility] ORS;  Service: General;  Laterality: Right;  . MASS EXCISION Right 11/26/2014   Procedure: MINOR EXCISION OF MASS/POST THIGH MASS;  Surgeon: Jerry Bellow,  MD;  Location: [facility] ORS;  Service: General;  Laterality: Right;  . PORTACATH PLACEMENT Right 06/19/2019    Family History  Problem Relation Age of Onset  . Diabetes Father   . Diabetes Mother   . Diabetes Sister   . Hypertension Sister   . Diabetes Brother   . Diabetes Sister   . Fibromyalgia Sister   . Diabetes Sister     Social History   Socioeconomic History  . Marital status: Married    Spouse name: Jerry [name]  . Number of children: 3  . Years of education: Not on file  . Highest education  level: High school graduate  Occupational History  . Occupation: Mining engineer    Comment: self employed  . Occupation: Ship broker    Comment: [employer]  Tobacco Use  . Smoking status: Never Smoker  . Smokeless tobacco: Never Used  Substance and Sexual Activity  . Alcohol use: No    Alcohol/week: 0.0 standard drinks  . Drug use: No  . Sexual activity: Yes    Partners: Female  Other Topics Concern  . Not on file  Social History Narrative   Lives with wife and 3 children   Approved for disability 02/2019 for ALS    Social Determinants of Health   Financial Resource Strain: Medium Risk  . Difficulty of Paying Living Expenses: Somewhat hard  Food Insecurity: No Food Insecurity  . Worried About Charity fundraiser in the Last Year: Never true  . Ran Out of Food in the Last Year: Never true  Transportation Needs: No Transportation Needs  . Lack of Transportation (Medical): No  . Lack of Transportation (Non-Medical): No  Physical Activity: Inactive  . Days of Exercise per Week: 0 days  . Minutes of Exercise per Session: 0 min  Stress: No Stress Concern Present  . Feeling of Stress : Not at all  Social Connections: Not Isolated  . Frequency of Communication with Friends and Family: More than three times a week  . Frequency of Social Gatherings with Friends and Family: More than three times a week  . Attends Religious Services: More than 4 times per year  . Active Member of Clubs or Organizations: Yes  . Attends Archivist Meetings: More than 4 times per year  . Marital Status: Married  Human resources officer Violence: Not At Risk  . Fear of Current or Ex-Partner: No  . Emotionally Abused: No  . Physically Abused: No  . Sexually Abused: No     Current Outpatient Medications:  .  baclofen (LIORESAL) 10 MG tablet, Take by mouth., Disp: , Rfl:  .  Edaravone (RADICAVA IV), Inject 60 mg into the vein., Disp: , Rfl:  .  riluzole (RILUTEK) 50 MG tablet, Take by mouth.,  Disp: , Rfl:  .  traZODone (DESYREL) 50 MG tablet, Take 0.5-1 tablets (25-50 mg total) by mouth at bedtime as needed for sleep., Disp: 30 tablet, Rfl: 0 .  amoxicillin (AMOXIL) 500 MG tablet, , Disp: , Rfl:  .  HYDROcodone-acetaminophen (NORCO) 7.5-325 MG tablet, TK 1 T PO Q 5 TO 6 H PRF PAIN, Disp: , Rfl:  .  Vitamin D, Ergocalciferol, (DRISDOL) 1.25 MG (50000 UT) CAPS capsule, Take 1 capsule (50,000 Units total) by mouth every 7 (seven) days. (Patient not taking: Reported on 06/27/2019), Disp: 12 capsule, Rfl: 0  Allergies  Allergen Reactions  . Crab [Shellfish Allergy] Rash    I personally reviewed active problem list, medication list, allergies, family history, social  history, health maintenance with the patient/caregiver today.   ROS  Ten systems reviewed and is negative except as mentioned in HPI   Objective  Virtual encounter, vitals not obtained.  There is no height or weight on file to calculate BMI.  Physical Exam  Awake, alert and oriented, he can lift the right forearm , shrug shoulders but unable to left the left arm he was able to stand up from chair without assistance, but had to shift in his seat and have legs wide and close to chair.   PHQ2/9: Depression screen [facility] 2/9 06/27/2019 04/04/2019 10/03/2018 04/28/2018 04/28/2018  Decreased Interest 0 0 0 0 0  Down, Depressed, Hopeless 0 0 0 0 0  PHQ - 2 Score 0 0 0 0 0  Altered sleeping 3 1 0 0 -  Tired, decreased energy 3 0 0 1 -  Change in appetite 0 0 0 0 -  Feeling bad or failure about yourself  0 0 0 0 -  Trouble concentrating 0 0 0 0 -  Moving slowly or fidgety/restless 0 0 0 0 -  Suicidal thoughts 0 0 0 0 -  PHQ-9 Score 6 1 0 1 -  Difficult doing work/chores Not difficult at all Not difficult at all - Not difficult at all -   PHQ-2/9 Result is negative.    Fall Risk: Fall Risk  06/27/2019 04/04/2019 10/03/2018 04/28/2018 07/28/2017  Falls in the past year? 1 1 1  No No  Number falls in past yr: 1 1 1  - -   Comment 4 within the last year - - - -  Injury with Fall? 0 1 1 - -  Comment - Last Monday-tripped on carpet - - -  Risk for fall due to : - Impaired balance/gait;Impaired mobility History of fall(s);Impaired balance/gait - -  Follow up - - Falls prevention discussed - -    Assessment & Plan  1. ALS (amyotrophic lateral sclerosis) ([place])  Working with Donald Siva RN to coordinate his care and get him the assistance he needs at home  2. Atrophy of muscle of both hands  Progressing   3. Gait instability  Progressing with a recent fall   I discussed the assessment and treatment plan with the patient. The patient was provided an opportunity to ask questions and all were answered. The patient agreed with the plan and demonstrated an understanding of the instructions.  The patient was advised to call back or seek an in-person evaluation if the symptoms worsen or if the condition fails to improve as anticipated.  I provided 25  minutes of non-face-to-face time during this encounter.

## 2019-06-28 ENCOUNTER — Ambulatory Visit: Payer: Medicare Other | Attending: Internal Medicine

## 2019-06-28 DIAGNOSIS — Z20822 Contact with and (suspected) exposure to covid-19: Secondary | ICD-10-CM

## 2019-06-30 LAB — NOVEL CORONAVIRUS, NAA: SARS-CoV-2, NAA: NOT DETECTED

## 2019-07-05 ENCOUNTER — Encounter: Payer: Self-pay | Admitting: Family Medicine

## 2019-07-19 ENCOUNTER — Ambulatory Visit (INDEPENDENT_AMBULATORY_CARE_PROVIDER_SITE_OTHER): Payer: Medicare Other | Admitting: Family Medicine

## 2019-07-19 ENCOUNTER — Encounter: Payer: Self-pay | Admitting: Family Medicine

## 2019-07-19 ENCOUNTER — Telehealth: Payer: Self-pay | Admitting: Family Medicine

## 2019-07-19 VITALS — BP 120/80 | HR 71 | Temp 98.6°F | Ht 65.0 in | Wt 191.0 lb

## 2019-07-19 DIAGNOSIS — G1221 Amyotrophic lateral sclerosis: Secondary | ICD-10-CM

## 2019-07-19 DIAGNOSIS — H9311 Tinnitus, right ear: Secondary | ICD-10-CM

## 2019-07-19 DIAGNOSIS — T783XXA Angioneurotic edema, initial encounter: Secondary | ICD-10-CM | POA: Diagnosis not present

## 2019-07-19 DIAGNOSIS — E559 Vitamin D deficiency, unspecified: Secondary | ICD-10-CM

## 2019-07-19 MED ORDER — LORATADINE 10 MG PO TABS: 10.0000 mg | ORAL_TABLET | Freq: Every day | ORAL | 11 refills | Status: DC

## 2019-07-19 MED ORDER — VITAMIN D (ERGOCALCIFEROL) 1.25 MG (50000 UNIT) PO CAPS: 50000.0000 [IU] | ORAL_CAPSULE | ORAL | 1 refills | Status: DC

## 2019-07-19 MED ORDER — EPINEPHRINE 0.3 MG/0.3ML IJ SOAJ: 0.3000 mg | INTRAMUSCULAR | 0 refills | Status: DC | PRN

## 2019-07-19 NOTE — Progress Notes (Signed)
Name: Jerry [name]   MRN: [id number]    DOB: [birth year]   Date:07/19/2019       Progress Note  Subjective  Chief Complaint  Chief Complaint  Patient presents with  . Ear Fullness    Onset-After he had the port put in, Right Ear, Hearing a thumbing sound in his right ear off and on.  . Vitamin D    Wants refill    I connected with  [name]  on 07/19/19 at  9:20 AM EST by a video enabled telemedicine application and verified that I am speaking with the correct person using two identifiers.  I discussed the limitations of evaluation and management by telemedicine and the availability of in person appointments. The patient expressed understanding and agreed to proceed. Staff also discussed with the patient that there may be a patient responsible charge related to this service. Patient Location: at home  Provider Location: [facility]  HPI  Ear Fullness: she states it is intermittent, it has been going on for the past few weeks. He states  he has tinnitus, described as a ticking noise. He states worse when in a quiet place. He states symptoms started after the port was placed. He denies hearing loss associated with tinnitus. No fever or chills, did not start with URI  Angioedema: he states right upper lid was itchy last night, today eye lid is swollen, no wheezing or SOB, no eye irritation in his eye. Discussed allergic reaction   Patient Active Problem List   Diagnosis Date Noted  . ALS (amyotrophic lateral sclerosis) ([place]) 01/11/2019  . Fasciculations of muscle 09/08/2018  . Weakness 09/08/2018  . Thenar muscle atrophy of left hand 09/08/2018  . Prediabetes 11/05/2016  . Family history of diabetes mellitus 11/04/2016    Past Surgical History:  Procedure Laterality Date  . MASS EXCISION Right 11/26/2014   Procedure: EXCISION MASS/GROIN EXCISION MASS;  Surgeon: Robert Bellow, MD;  Location: [facility] ORS;  Service: General;  Laterality: Right;  . MASS EXCISION  Right 11/26/2014   Procedure: MINOR EXCISION OF MASS/POST THIGH MASS;  Surgeon: Robert Bellow, MD;  Location: [facility] ORS;  Service: General;  Laterality: Right;  . PORTACATH PLACEMENT Right 06/19/2019    Family History  Problem Relation Age of Onset  . Diabetes Father   . Diabetes Mother   . Diabetes Sister   . Hypertension Sister   . Diabetes Brother   . Diabetes Sister   . Fibromyalgia Sister   . Diabetes Sister     Social History   Socioeconomic History  . Marital status: Married    Spouse name: [name]  . Number of children: 3  . Years of education: Not on file  . Highest education level: High school graduate  Occupational History  . Occupation: Mining engineer    Comment: self employed  . Occupation: Ship broker    Comment: [employer]  Tobacco Use  . Smoking status: Never Smoker  . Smokeless tobacco: Never Used  Substance and Sexual Activity  . Alcohol use: No    Alcohol/week: 0.0 standard drinks  . Drug use: No  . Sexual activity: Yes    Partners: Female  Other Topics Concern  . Not on file  Social History Narrative   Lives with wife and 3 children   Approved for disability 02/2019 for ALS    Social Determinants of Health   Financial Resource Strain: Medium Risk  . Difficulty of Paying Living Expenses: Somewhat hard  Food  Insecurity: No Food Insecurity  . Worried About Charity fundraiser in the Last Year: Never true  . Ran Out of Food in the Last Year: Never true  Transportation Needs: No Transportation Needs  . Lack of Transportation (Medical): No  . Lack of Transportation (Non-Medical): No  Physical Activity: Inactive  . Days of Exercise per Week: 0 days  . Minutes of Exercise per Session: 0 min  Stress: No Stress Concern Present  . Feeling of Stress : Not at all  Social Connections: Not Isolated  . Frequency of Communication with Friends and Family: More than three times a week  . Frequency of Social Gatherings with Friends and Family: More than  three times a week  . Attends Religious Services: More than 4 times per year  . Active Member of Clubs or Organizations: Yes  . Attends Archivist Meetings: More than 4 times per year  . Marital Status: Married  Human resources officer Violence: Not At Risk  . Fear of Current or Ex-Partner: No  . Emotionally Abused: No  . Physically Abused: No  . Sexually Abused: No     Current Outpatient Medications:  .  baclofen (LIORESAL) 10 MG tablet, Take by mouth., Disp: , Rfl:  .  Edaravone (RADICAVA IV), Inject 60 mg into the vein., Disp: , Rfl:  .  NON FORMULARY, Take 1 capsule by mouth daily. Vitamin D3 125 mcg and K2 50 mcg, Disp: , Rfl:  .  riluzole (RILUTEK) 50 MG tablet, Take by mouth., Disp: , Rfl:  .  traZODone (DESYREL) 50 MG tablet, Take 0.5-1 tablets (25-50 mg total) by mouth at bedtime as needed for sleep., Disp: 30 tablet, Rfl: 0 .  amoxicillin (AMOXIL) 500 MG tablet, , Disp: , Rfl:  .  HYDROcodone-acetaminophen (NORCO) 7.5-325 MG tablet, TK 1 T PO Q 5 TO 6 H PRF PAIN, Disp: , Rfl:  .  Vitamin D, Ergocalciferol, (DRISDOL) 1.25 MG (50000 UT) CAPS capsule, Take 1 capsule (50,000 Units total) by mouth every 7 (seven) days. (Patient not taking: Reported on 06/27/2019), Disp: 12 capsule, Rfl: 0  Allergies  Allergen Reactions  . Crab [Shellfish Allergy] Rash    I personally reviewed active problem list, medication list, allergies, family history, social history with the patient/caregiver today.   ROS  Ten systems reviewed and is negative except as mentioned in HPI  Still has muscle weakness from ALS  Objective  Virtual encounter, vitals not obtained.  Body mass index is 31.78 kg/m.  Physical Exam  Awake, alert and oriented. Angioedema right eye lid   PHQ2/9: Depression screen [facility] 2/9 07/19/2019 06/27/2019 06/27/2019 04/04/2019 10/03/2018  Decreased Interest 0 0 0 0 0  Down, Depressed, Hopeless 0 0 0 0 0  PHQ - 2 Score 0 0 0 0 0  Altered sleeping 0 - 3 1 0  Tired,  decreased energy 0 - 3 0 0  Change in appetite 0 - 0 0 0  Feeling bad or failure about yourself  0 - 0 0 0  Trouble concentrating 0 - 0 0 0  Moving slowly or fidgety/restless 0 - 0 0 0  Suicidal thoughts 0 - 0 0 0  PHQ-9 Score 0 - 6 1 0  Difficult doing work/chores Not difficult at all - Not difficult at all Not difficult at all -   PHQ-2/9 Result is negative.    Fall Risk: Fall Risk  07/19/2019 06/27/2019 06/27/2019 06/27/2019 04/04/2019  Falls in the past year? 1 1 1  1  1  Number falls in past yr: 1 - - 1 1  Comment - - - 4 within the last year -  Injury with Fall? 0 0 - 0 1  Comment - - - - Last Monday-tripped on carpet  Risk for fall due to : Other (Comment);Impaired balance/gait;Impaired mobility;Medication side effect;History of fall(s) Impaired balance/gait;Medication side effect;Impaired mobility;History of fall(s);Other (Comment) - - Impaired balance/gait;Impaired mobility  Risk for fall due to: Comment ALS Dx with ALS - - -  Follow up - Falls prevention discussed - - -     Assessment & Plan  1. ALS (amyotrophic lateral sclerosis) ([place])  He has follow up with neurologist next month and will wait to see him before I make referral to ENT He is having PT, OT and nurse will start coming to his house daily to assist him   2. New onset tinnitus of right ear  He will monitor for hearing loss and if happens we will refer to ENT, otherwise wait for neurologist visit, explained it may be secondary to ALS   3. Angioedema of eyelid  Discussed symptoms of epiglottis closure and need to use epipen - EPINEPHrine (EPIPEN 2-PAK) 0.3 mg/0.3 mL IJ SOAJ injection; Inject 0.3 mLs (0.3 mg total) into the muscle as needed for anaphylaxis.  Dispense: 1 each; Refill: 0 - loratadine (CLARITIN) 10 MG tablet; Take 1 tablet (10 mg total) by mouth daily.  Dispense: 30 tablet; Refill: 11  4. Vitamin D deficiency  - Vitamin D, Ergocalciferol, (DRISDOL) 1.25 MG (50000 UNIT) CAPS capsule; Take 1  capsule (50,000 Units total) by mouth every 7 (seven) days.  Dispense: 12 capsule; Refill: 1  I discussed the assessment and treatment plan with the patient. The patient was provided an opportunity to ask questions and all were answered. The patient agreed with the plan and demonstrated an understanding of the instructions.  The patient was advised to call back or seek an in-person evaluation if the symptoms worsen or if the condition fails to improve as anticipated.  I provided 25 minutes of non-face-to-face time during this encounter.

## 2019-07-19 NOTE — Telephone Encounter (Signed)
Jerry [name] with Jerry [name] is calling to Requesting orders for Social work consult for grief consoling  and community resources [phone] it is ok leave verbal on voice mail

## 2019-07-20 NOTE — Telephone Encounter (Signed)
Left message to inquire what they need for his orders per Dr. Ancil Boozer

## 2019-07-20 NOTE — Telephone Encounter (Signed)
Copied from [place] [phone]. Topic: General - Inquiry >> Jul 19, 2019  3:53 PM Alease Frame wrote: Reason for CRM: Nira Conn calling from [facility] care . RN evaluation only .  Call back [phone]

## 2019-08-08 ENCOUNTER — Telehealth: Payer: Self-pay | Admitting: Family Medicine

## 2019-08-08 NOTE — Telephone Encounter (Signed)
Jerry [name] from [facility] called and states he was checking on order request fro 1.15.2021/Asking for referral order for Grief counseling and community resources / please advise

## 2019-08-13 ENCOUNTER — Telehealth: Payer: Self-pay | Admitting: Family Medicine

## 2019-08-13 NOTE — Telephone Encounter (Signed)
Copied from [place] [phone]. Topic: General - Other >> Aug 13, 2019 12:22 PM Keene Breath wrote: Reason for CRM: Verbal order for PT for patient - 1xwk for 4 wks. Starting next week.  Please advise.  CB# [phone]

## 2019-08-15 ENCOUNTER — Telehealth: Payer: Self-pay | Admitting: Family Medicine

## 2019-08-15 NOTE — Telephone Encounter (Signed)
Verbal ordered given for PT.

## 2019-08-15 NOTE — Telephone Encounter (Signed)
Pt called and stated that he would like a call back from the office regarding a wheel chair and getting a Education officer, museum. Please advise

## 2019-08-16 ENCOUNTER — Ambulatory Visit: Payer: Medicare Other | Admitting: Internal Medicine

## 2019-08-20 ENCOUNTER — Ambulatory Visit: Payer: Medicare Other | Admitting: Family Medicine

## 2019-08-21 ENCOUNTER — Telehealth: Payer: Self-pay

## 2019-08-21 NOTE — Telephone Encounter (Signed)
Pt was on Leisa's schedule yesterday for orders for medical equipment.  Me and Marnette Burgess discussed and we took him off of Leisa's schedule since you are PCP and you just saw him.  Patient gave me # of OT to call to ask for orders.  I reached  Out to him and he states he was discharge from OT 5 weeks ago.  I asked about orders and he states [facility] does not do medical supplies and that his insurance would not cover these supplies and that he told patient this as well.  Please advise?  Pt stating he needs, shower chair, wheelchair, transfer pad and stair lift since he still lives in 2 story home.

## 2019-08-22 NOTE — Telephone Encounter (Signed)
Pt notified, we could do orders but not sure if ins would cover.  He states when I called he was on his way to the [facility] and would talk to them about what he may need and if they could get  Insurance covered/finacial guidance.  Told patient we are here to help, just get back to me on details.

## 2019-08-24 ENCOUNTER — Ambulatory Visit: Payer: Self-pay

## 2019-08-24 DIAGNOSIS — G1221 Amyotrophic lateral sclerosis: Secondary | ICD-10-CM

## 2019-08-24 NOTE — Chronic Care Management (AMB) (Signed)
   Care Management   Note  08/24/2019 Name: Luke Boody MRN: [id number] DOB: 30-Jan-1971   Alexender Seelbach is a 49 y.o. year old male who sees [name], MD for primary care. He is currently engaged with the care management team. A routine outreach was conducted today.  Review of Mr. Mitzner status, including review of consultants reports, relevant labs and test results was conducted today. Collaboration with appropriate care team members was performed as part of the comprehensive evaluation and provision of chronic care management services.    Goals Addressed            This Visit's Progress   . Chronic Disease Management       Current Barriers:  . Chronic Disease Management support related to Amyotrophic lateral sclerosis (ALS).  Case Manager Clinical Goal(s):  Marland Kitchen Over the next 90 days, patient will not be hospitalized for complications related to chronic illnesses. . Over the next 90 days, patient will take all medications as prescribed. . Over the next 90 days, patient will attend all medical appointments as scheduled. . Over the next 90 days, patient will follow recommended safety precautions to prevent falls and injuries. . Over the next 60 days, patient will work with Chronic Care Management team to address plan for home health and long term care services.   Interventions:  . Reviewed medications. Encouraged to take medications as prescribed. Encouraged to notify provider and CCM team with concerns regarding medication management or prescription costs.  . Discussed home safety and fall prevention measures. Encouraged to follow measures and use assistive devices to prevent falls. Confirmed contact with the [facility]. Reports the group will provide a loaner power chair until permanent chair can be obtained. Will also contact if a Hoyer lift is needed. Will follow up regarding need for a transfer bench. Also considering a stair lift. Pending feedback regarding estimated  costs. . Discussed current personal care needs and plan for long term assistance. Discussed options. Spouse continues to serve as the primary care provider. Reports receiving a call from [facility]. Will follow up to determine status of referral.  . Reviewed scheduled/pending appointments and transportation needs. Encouraged to attend appointments as scheduled to prevent delays in care.    Patient Self Care Activities:  . Attends all scheduled provider appointments . Calls pharmacy for medication refills . Calls provider office for new concerns or questions . Unable to perform ADLs independently . Unable to perform IADLs independently  Please see past updates related to this goal by clicking on the "Past Updates" button in the selected goal         PLAN The care management team will follow up with Mr. Nyoka Cowden within the next week to discuss status of personal care services.     [facility] Care Management [phone]

## 2019-08-29 ENCOUNTER — Ambulatory Visit: Payer: Medicare Other

## 2019-08-29 NOTE — Chronic Care Management (AMB) (Signed)
   Care Management   Follow Up Note   08/29/2019 Name: Jerry [name] MRN: [id number] DOB: 28-Oct-1970  Referred by: [name], MD Reason for referral : Care Management  Jerry [name] is a 49 y.o. year old male who is a primary care patient of [name], MD. He is currently engaged with the care management team. A telephonic follow up was conducted today to discuss status of personal care services.    Outpatient Encounter Medications as of 08/29/2019  Medication Sig  . baclofen (LIORESAL) 10 MG tablet Take by mouth.  . Edaravone (RADICAVA IV) Inject 60 mg into the vein.  Marland Kitchen EPINEPHrine (EPIPEN 2-PAK) 0.3 mg/0.3 mL IJ SOAJ injection Inject 0.3 mLs (0.3 mg total) into the muscle as needed for anaphylaxis.  Marland Kitchen loratadine (CLARITIN) 10 MG tablet Take 1 tablet (10 mg total) by mouth daily.  . NON FORMULARY Take 1 capsule by mouth daily. Vitamin D3 125 mcg and K2 50 mcg  . riluzole (RILUTEK) 50 MG tablet Take by mouth.  . traZODone (DESYREL) 50 MG tablet Take 0.5-1 tablets (25-50 mg total) by mouth at bedtime as needed for sleep.  . Vitamin D, Ergocalciferol, (DRISDOL) 1.25 MG (50000 UNIT) CAPS capsule Take 1 capsule (50,000 Units total) by mouth every 7 (seven) days.   No facility-administered encounter medications on file as of 08/29/2019.      Goals Addressed            This Visit's Progress   . Chronic Disease Management       Current Barriers:  . Chronic Disease Management support related to Amyotrophic lateral sclerosis (ALS).  Case Manager Clinical Goal(s):  Marland Kitchen Over the next 90 days, patient will not be hospitalized for complications related to chronic illnesses. . Over the next 90 days, patient will take all medications as prescribed. . Over the next 90 days, patient will attend all medical appointments as scheduled. . Over the next 90 days, patient will follow recommended safety precautions to prevent falls and injuries. . Over the next 60 days, patient will work with  Chronic Care Management team to address plan for home health and long term care services.   Interventions:  . Reviewed medications. Encouraged to take medications as prescribed. Encouraged to notify provider and CCM team with concerns regarding medication management or prescription costs.  . Discussed home safety and fall prevention measures. Encouraged to follow measures and use assistive devices to prevent falls. Discussed current personal care needs and plan for long term assistance.  Contacted staff at [facility] Staff indicated that a preferred in-home care agencies was needed. Updated Jerry [name] regarding the request. Will submit the list and follow up within the next week. . Reviewed scheduled/pending appointments and transportation needs. Encouraged to attend appointments as scheduled to prevent delays in care. Declines current need for transportation assistance.   Patient Self Care Activities:  . Attends scheduled provider appointments . Calls pharmacy for medication refills . Calls provider office for new concerns or questions . Unable to perform ADLs independently . Unable to perform IADLs independently  Please see past updates related to this goal by clicking on the "Past Updates" button in the selected goal          PLAN The care management team will follow up within the next week.   [facility] Care Management [phone]

## 2019-08-31 ENCOUNTER — Ambulatory Visit: Payer: Medicare Other

## 2019-08-31 NOTE — Chronic Care Management (AMB) (Signed)
  Chronic Care Management   Note   Care Coordination/PCS Services   08/31/2019 Name: Mutaz Lunday MRN: [id number] DOB: 1970-08-05   Discussion with [facility] personnel regarding status of referrals. Per staff, the requested agencies were unable to provide services due to staffing. Also indicated that they are only able to submit three requests at a time.  Confirmed that request to three additional agencies was submitted.    Follow up plan: The care management team will follow up within the next two to three weeks.   [facility] Care Management [phone]

## 2019-09-10 ENCOUNTER — Other Ambulatory Visit: Payer: Self-pay | Admitting: Family Medicine

## 2019-09-10 DIAGNOSIS — G4701 Insomnia due to medical condition: Secondary | ICD-10-CM

## 2019-09-10 MED ORDER — TRAZODONE HCL 50 MG PO TABS: 25.0000 mg | ORAL_TABLET | Freq: Every evening | ORAL | 0 refills | Status: DC | PRN

## 2019-09-19 ENCOUNTER — Ambulatory Visit: Payer: Self-pay

## 2019-09-28 ENCOUNTER — Ambulatory Visit: Payer: Medicare Other

## 2019-10-01 ENCOUNTER — Ambulatory Visit: Payer: Medicare Other

## 2019-10-01 NOTE — Patient Instructions (Addendum)
Thank you for allowing the Chronic Care Management team to participate in your care.  Goals Addressed            This Visit's Progress   . Chronic Disease Management       Current Barriers:  . Chronic Disease Management support related to Amyotrophic lateral sclerosis (ALS).  Case Manager Clinical Goal(s):  Marland Kitchen Over the next 90 days, patient will not be hospitalized for complications related to chronic illnesses. . Over the next 90 days, patient will take all medications as prescribed. . Over the next 90 days, patient will attend all medical appointments as scheduled. . Over the next 90 days, patient will follow recommended safety precautions to prevent falls and injuries. . Over the next 60 days, patient will work with Chronic Care Management team to address plan for home health and long term care services.   Interventions:  . Reviewed medications. Encouraged to take medications as prescribed. Encouraged to notify provider and CCM team with concerns regarding medication management or prescription costs. Reports spouse currently prepares medications. No concerns regarding prescription costs. . Discussed home safety and fall prevention measures. Encouraged to follow measures and use assistive devices to prevent falls. . Discussed current personal care needs and plan for long term assistance. Discussed options. Reports spouse is currently serving as the primary care provider. Several referrals have been declined due to staffing and insurance restrictions. Reports submitting application for Medicaid. Provider has submitted required documents for [place] [facility]. Pending approval. . Reviewed scheduled/pending appointments and transportation needs. Encouraged to attend appointments as scheduled to prevent delays in care. Declined current need for transportation assistance.   Patient Self Care Activities:  . Attends all scheduled provider appointments . Calls pharmacy for medication  refills . Calls provider office for new concerns or questions . Unable to perform ADLs independently . Unable to perform IADLs independently  Initial goal documentation         Mr. Zant was given information about Care Management services  including:  1. Care Management services include personalized support from designated clinical staff supervised by a physician, including individualized plan of care and coordination with other care providers 2. 24/7 contact phone numbers for assistance for urgent and routine care needs. 3. The patient may stop Care Management services at any time (effective at the end of the month) by phone call to the office staff.  Patient agreed to services and verbal consent obtained.    Mr. Hambelton verbalized understanding of the instructions provided during the telephonic outreach. Declined need for printed/mailed copy of the instructions.   The care management team will follow up with Mr. Blessington to discuss pending approval for [place] [facility].    [facility] Care Management [phone]

## 2019-10-01 NOTE — Patient Instructions (Addendum)
Thank you for allowing the Chronic Care Management team to participate in your care.  Goals Addressed            This Visit's Progress   . Chronic Disease Management       Current Barriers:  . Chronic Disease Management support related to Amyotrophic lateral sclerosis (ALS).  Case Manager Clinical Goal(s):  Marland Kitchen Over the next 90 days, patient will not be hospitalized for complications related to chronic illnesses. . Over the next 90 days, patient will take all medications as prescribed. . Over the next 90 days, patient will attend all medical appointments as scheduled. . Over the next 90 days, patient will follow recommended safety precautions to prevent falls and injuries. . Over the next 60 days, patient will work with Chronic Care Management team to address plan for home health and long term care services.   Interventions:  . Reviewed medications. Encouraged to take medications as prescribed. Encouraged to notify provider and CCM team with concerns regarding medication management or prescription costs.  . Discussed home safety and fall prevention measures. Encouraged to follow measures and use assistive devices to prevent falls. Confirmed contact with the [facility]. Reports the group will provide a loaner power chair until permanent chair can be obtained. Will also contact if a Hoyer lift is needed. Will follow up regarding need for a transfer bench. Also considering a stair lift. Pending feedback regarding estimated costs. . Discussed current personal care needs and plan for long term assistance. Discussed options. Spouse continues to serve as the primary care provider. Reports receiving a call from [facility]. Will follow up to determine status of referral.  . Reviewed scheduled/pending appointments and transportation needs. Encouraged to attend appointments as scheduled to prevent delays in care.    Patient Self Care Activities:  . Attends all scheduled provider  appointments . Calls pharmacy for medication refills . Calls provider office for new concerns or questions . Unable to perform ADLs independently . Unable to perform IADLs independently  Please see past updates related to this goal by clicking on the "Past Updates" button in the selected goal          Jerry [name] verbalized understanding of the instructions provided during the telephonic outreach today. Declined need for a printed/mailed copy of the instructions.   The care management team will follow up with Jerry [name] within the next week to discuss status of personal care services.    [facility] Care Management [phone]

## 2019-10-01 NOTE — Chronic Care Management (AMB) (Signed)
  Care Management /Care Coordination  Note  10/01/2019 Name: Fiore Frangella MRN: [id number] DOB: [birth year]   Brief outreach with Mr. Nyoka Cowden regarding pending personal care services. He confirmed receipt of the mailed document of approved agencies in [place]. He will discuss with his spouse and select preferred agencies if the current requests are declined.   PLAN The care management team will follow-up in two weeks.   [facility] Care Management [phone]

## 2019-10-08 ENCOUNTER — Telehealth: Payer: Self-pay

## 2019-10-11 ENCOUNTER — Other Ambulatory Visit: Payer: Self-pay | Admitting: Family Medicine

## 2019-10-11 DIAGNOSIS — G4701 Insomnia due to medical condition: Secondary | ICD-10-CM

## 2019-10-12 ENCOUNTER — Telehealth: Payer: Self-pay

## 2019-10-12 ENCOUNTER — Ambulatory Visit: Payer: Self-pay

## 2019-10-12 NOTE — Chronic Care Management (AMB) (Signed)
   Care Management   Outreach Note  10/12/2019 Name: Male Meath MRN: [id number] DOB: Feb 28, 1971  Primary Care Provider: [name], MD Reason for referral : Chronic Care Management   An unsuccessful telephone outreach was attempted today. Mr. Maran is currently engaged with the care management team.  A HIPAA compliant voice message was left today requesting a return call.    Follow Up Plan The care management team will reach out to Mr. Valera again next week to discuss status of in-home services.   [facility] Care Management [phone]

## 2019-10-17 ENCOUNTER — Telehealth: Payer: Medicare Other

## 2019-10-22 ENCOUNTER — Ambulatory Visit: Payer: Medicare Other

## 2019-10-22 NOTE — Chronic Care Management (AMB) (Signed)
  Care Management   Note  10/22/2019 Name: Jerry [name] MRN: [id number] DOB: 12/13/1970   Follow up outreach to discuss status of personal care services.  Goals Addressed            This Visit's Progress   . Chronic Disease Management       Current Barriers:  . Chronic Disease Management support related to Amyotrophic lateral sclerosis (ALS).  Case Manager Clinical Goal(s):  Marland Kitchen Over the next 90 days, patient will not be hospitalized for complications related to chronic illnesses. . Over the next 90 days, patient will take all medications as prescribed. . Over the next 90 days, patient will attend all medical appointments as scheduled. . Over the next 90 days, patient will follow recommended safety precautions to prevent falls and injuries. . Over the next 60 days, patient will work with Chronic Care Management team to address plan for home health and long term care services.   Interventions:  . Reviewed medications. Encouraged to take medications as prescribed. Encouraged to notify provider and CCM team with concerns regarding medication management or prescription costs.  . Discussed home safety and fall prevention measures. Encouraged to follow measures and use assistive devices to prevent falls. Discussed current personal care needs and plan for long term assistance.  Per [facility] staff, referrals were declined due to staffing. Indicated that the following agencies responded and declined: [facility], [facility], [facility], and [facility]. List of approved agencies mailed to Mr. and Mrs. Green for review. Family will review and submit next three requests. . Reviewed scheduled/pending appointments and transportation needs. Encouraged to attend appointments as scheduled to prevent delays in care.   Patient Self Care Activities:  . Attends scheduled provider appointments . Calls pharmacy for medication refills . Calls provider office for new concerns or  questions . Unable to perform ADLs independently . Unable to perform IADLs independently  Please see past updates related to this goal by clicking on the "Past Updates" button in the selected goal          PLAN -A list of approved personal care service agencies will be mailed to Mr. Green and his spouse for review. -The care management team will follow up within the next two weeks.   [facility] Care Management [phone]

## 2019-10-22 NOTE — Chronic Care Management (AMB) (Signed)
  Care Management   Note for 09/19/19   Name: Jerry [name] MRN: [id number] DOB: 1971/03/10   Brief outreach with [facility] to determine status of personal care services. No updates available at this time. Updates from the requested agencies is still pending.   Follow up plan: The care management team will follow up within the next two weeks.  [facility] Care Management [phone]

## 2019-11-09 ENCOUNTER — Telehealth: Payer: Self-pay

## 2019-11-09 NOTE — Chronic Care Management (AMB) (Signed)
Error Please Disregard

## 2019-11-26 ENCOUNTER — Other Ambulatory Visit: Payer: Self-pay | Admitting: Family Medicine

## 2019-11-26 DIAGNOSIS — G4701 Insomnia due to medical condition: Secondary | ICD-10-CM

## 2019-11-28 ENCOUNTER — Ambulatory Visit: Payer: Medicare Other

## 2019-11-28 DIAGNOSIS — G1221 Amyotrophic lateral sclerosis: Secondary | ICD-10-CM

## 2019-12-12 NOTE — Patient Instructions (Signed)
Thank you for allowing the Chronic Care Management team to participate in your care.  Goals Addressed            This Visit's Progress   . Chronic Disease Management       Current Barriers:  . Chronic Disease Management support related to Amyotrophic lateral sclerosis (ALS).  Case Manager Clinical Goal(s):  Marland Kitchen Over the next 90 days, patient will not be hospitalized for complications related to chronic illnesses.-Complete . Over the next 90 days, patient will take all medications as prescribed.-Complete . Over the next 90 days, patient will attend all medical appointments as scheduled.-Complete . Over the next 90 days, patient will follow recommended safety precautions to prevent falls and injuries.-Complete . Over the next 60 days, patient will work with Chronic Care Management team to address plan for home health and long term care services.   Interventions:  .  Discussed current personal care needs and plan for long term assistance.  Per [facility] staff, additional referral was submitted to [facility] but referral was declined. Will continue to outreach with agencies for in-home assistance. Previously discussed option for spouse to serve as PCA. Informed CCM LCSW. Will likely consider this option if referrals continue to be declined.  .   Patient Self Care Activities:  . Attends scheduled provider appointments . Calls pharmacy for medication refills . Calls provider office for new concerns or questions . Unable to perform ADLs independently . Unable to perform IADLs independently  Please see past updates related to this goal by clicking on the "Past Updates" button in the selected goal         Jerry [name] verbalized understanding of the information discussed during the brief telephonic outreach today. Did not require a mailed/printed copy of the information.   The care management team will continue to outreach with in-home service agencies and follow-up within the next  month.   [facility] Care Management [phone]

## 2019-12-12 NOTE — Chronic Care Management (AMB) (Signed)
  Chronic Care Management   Follow Up Note    Name: Jerry [name] MRN: [id number] DOB: [birth year]  Primary Care Provider: [name], MD Reason for referral : Chronic Care Management   Jerry [name] is a 49 y.o. year old male who is a primary care patient of [name], MD. He is currently engaged with the chronic care management team. A brief telephonic outreach was conducted.  Review of [name] status, including review of consultants reports, relevant labs and test results was conducted today. Collaboration with appropriate care team members was performed as part of the comprehensive evaluation and provision of chronic care management services.    SDOH (Social Determinants of Health) assessments performed: No     Outpatient Encounter Medications as of 11/28/2019  Medication Sig  . baclofen (LIORESAL) 10 MG tablet Take by mouth.  . Edaravone (RADICAVA IV) Inject 60 mg into the vein.  Marland Kitchen EPINEPHrine (EPIPEN 2-PAK) 0.3 mg/0.3 mL IJ SOAJ injection Inject 0.3 mLs (0.3 mg total) into the muscle as needed for anaphylaxis.  Marland Kitchen loratadine (CLARITIN) 10 MG tablet Take 1 tablet (10 mg total) by mouth daily.  . NON FORMULARY Take 1 capsule by mouth daily. Vitamin D3 125 mcg and K2 50 mcg  . riluzole (RILUTEK) 50 MG tablet Take by mouth.  . traZODone (DESYREL) 50 MG tablet TAKE 1/2 TO 1 TABLET(25 TO 50 MG) BY MOUTH AT BEDTIME AS NEEDED FOR SLEEP  . Vitamin D, Ergocalciferol, (DRISDOL) 1.25 MG (50000 UNIT) CAPS capsule Take 1 capsule (50,000 Units total) by mouth every 7 (seven) days.   No facility-administered encounter medications on file as of 11/28/2019.       Goals Addressed            This Visit's Progress   . Chronic Disease Management       Current Barriers:  . Chronic Disease Management support related to Amyotrophic lateral sclerosis (ALS).  Case Manager Clinical Goal(s):  Marland Kitchen Over the next 90 days, patient will not be hospitalized for complications related to  chronic illnesses.-Complete . Over the next 90 days, patient will take all medications as prescribed.-Complete . Over the next 90 days, patient will attend all medical appointments as scheduled.-Complete . Over the next 90 days, patient will follow recommended safety precautions to prevent falls and injuries.-Complete . Over the next 60 days, patient will work with Chronic Care Management team to address plan for home health and long term care services.   Interventions:  .  Discussed current personal care needs and plan for long term assistance.  Per [facility] staff, additional referral was submitted to [facility] but referral was declined. Will continue to outreach with agencies for in-home assistance. Previously discussed option for spouse to serve as PCA. Informed CCM LCSW. Will likely consider this option if referrals continue to be declined.  .   Patient Self Care Activities:  . Attends scheduled provider appointments . Calls pharmacy for medication refills . Calls provider office for new concerns or questions . Unable to perform ADLs independently . Unable to perform IADLs independently  Please see past updates related to this goal by clicking on the "Past Updates" button in the selected goal          PLAN The care management team will continue to assist by contacting home agencies to determine availability to provide in-home services. Will follow-up within a month.   [facility] Care Management [phone]

## 2019-12-14 ENCOUNTER — Ambulatory Visit: Payer: Medicare Other

## 2019-12-17 NOTE — Chronic Care Management (AMB) (Signed)
  Chronic Care Management   Note   Name: Jerry [name] MRN: [id number] DOB: 01-Oct-1970   Care Coordination: In-Home Services Successful outreach with staff from [facility], [facility], [facility] and [facility]. Staff confirmed availability to provide personal care services. Representatives will contact [name] directly to discuss specific needs.   Follow up plan: Care management team will follow-up with [name] next week to submit preferred agencies to [facility].  [facility] Care Management [phone]

## 2019-12-19 ENCOUNTER — Ambulatory Visit: Payer: Medicare Other

## 2019-12-26 ENCOUNTER — Ambulatory Visit: Payer: Medicare Other | Admitting: Family Medicine

## 2019-12-28 ENCOUNTER — Ambulatory Visit: Payer: Medicare Other

## 2019-12-28 DIAGNOSIS — G1221 Amyotrophic lateral sclerosis: Secondary | ICD-10-CM

## 2019-12-28 NOTE — Chronic Care Management (AMB) (Signed)
  Chronic Care Management   Note   Name: Jerry [name] MRN: [id number] DOB: 03-07-1971  Care Coordination Only: [facility] Request for services submitted: [facility], [facility] and [facility].     Follow up plan: The care management team will follow-up next week.  [facility] Care Management [phone]

## 2019-12-31 NOTE — Chronic Care Management (AMB) (Signed)
Care Management   Follow Up Note for Encounter on 12/28/19.    Name: Jerry [name] MRN: [id number] DOB: 12-02-1970  Primary Care Provider: [name], MD Reason for referral : Chronic Care Management   Jerry [name] is a 49 y.o. year old male who is a primary care patient of [name], MD. He is currently engaged with the care management team.  Review of Mr. Candela status, including review of consultants reports, relevant labs and test results was conducted today. Collaboration with appropriate care team members was performed as part of the comprehensive evaluation and provision of chronic care management services.     SDOH (Social Determinants of Health) assessments performed: No    Current Outpatient Medications on File Prior to Visit  Medication Sig Dispense Refill   baclofen (LIORESAL) 10 MG tablet Take by mouth.     Edaravone (RADICAVA IV) Inject 60 mg into the vein.     EPINEPHrine (EPIPEN 2-PAK) 0.3 mg/0.3 mL IJ SOAJ injection Inject 0.3 mLs (0.3 mg total) into the muscle as needed for anaphylaxis. 1 each 0   loratadine (CLARITIN) 10 MG tablet Take 1 tablet (10 mg total) by mouth daily. 30 tablet 11   NON FORMULARY Take 1 capsule by mouth daily. Vitamin D3 125 mcg and K2 50 mcg     riluzole (RILUTEK) 50 MG tablet Take by mouth.     traZODone (DESYREL) 50 MG tablet TAKE 1/2 TO 1 TABLET(25 TO 50 MG) BY MOUTH AT BEDTIME AS NEEDED FOR SLEEP 30 tablet 0   Vitamin D, Ergocalciferol, (DRISDOL) 1.25 MG (50000 UNIT) CAPS capsule Take 1 capsule (50,000 Units total) by mouth every 7 (seven) days. 12 capsule 1   No current facility-administered medications on file prior to visit.    Goals Addressed            This Visit's Progress    Obtain Long Term In-Home Services       Current Barriers:   Chronic Disease Management support related to Amyotrophic lateral sclerosis (ALS).  Case Manager Clinical Goal(s):   Over the next 90 days, patient will not be  hospitalized for complications related to chronic illnesses.-Complete  Over the next 90 days, patient will take all medications as prescribed.-Complete  Over the next 90 days, patient will attend all medical appointments as scheduled.-Complete  Over the next 90 days, patient will follow recommended safety precautions to prevent falls and injuries.-Complete  Over the next 60 days, patient will work with Chronic Care Management team to address plan for home health and long term care services.-Complete   Interventions:    Discussed current personal care needs and plan for long term assistance. Confirmed receipt of referral to Mr. Barresi's preferred agencies: [facility] ([facility]), [facility] and [facility]. [place] staff requested an updated request for [facility] based agency) d/t [facility] being operated as one of the satellite offices. Information submitted as requested. Anticipate start of services within the next 30 days.  Patient Self Care Activities:   Attends scheduled provider appointments  Calls pharmacy for medication refills  Calls provider office for new concerns or questions  Unable to perform ADLs independently  Unable to perform IADLs independently  Please see past updates related to this goal by clicking on the "Past Updates" button in the selected goal          PLAN The care management team will reach out to the patient again within the next two weeks or  sooner if additional information is needed from [facility].   [facility] Care Management [phone]

## 2019-12-31 NOTE — Patient Instructions (Signed)
Thank you for allowing the Chronic Care Management team to participate in your care.  Goals Addressed            This Visit's Progress   . Obtain Long Term In-Home Services       Current Barriers:  . Chronic Disease Management support related to Amyotrophic lateral sclerosis (ALS).  Case Manager Clinical Goal(s):  Marland Kitchen Over the next 90 days, patient will not be hospitalized for complications related to chronic illnesses.-Complete . Over the next 90 days, patient will take all medications as prescribed.-Complete . Over the next 90 days, patient will attend all medical appointments as scheduled.-Complete . Over the next 90 days, patient will follow recommended safety precautions to prevent falls and injuries.-Complete . Over the next 60 days, patient will work with Chronic Care Management team to address plan for home health and long term care services.-Complete   Interventions:  .  Discussed current personal care needs and plan for long term assistance. Confirmed receipt of referral to Mr. Credeur's preferred agencies: [facility] ([facility]), [facility] and [facility]. [place] staff requested an updated request for [facility] based agency) d/t [facility] being operated as one of the satellite offices. Information submitted as requested. Anticipate start of services within the next 30 days.  Patient Self Care Activities:  . Attends scheduled provider appointments . Calls pharmacy for medication refills . Calls provider office for new concerns or questions . Unable to perform ADLs independently . Unable to perform IADLs independently  Please see past updates related to this goal by clicking on the "Past Updates" button in the selected goal         Mr. Dibella verbalized understanding of the current plan during our discussion today. Did not indicate need for mailed/printed instructions.   The care management team will follow up with Mr.  Sage within the next two weeks or sooner if additional information is needed from [facility].   [facility] Care Management [phone]

## 2020-01-10 ENCOUNTER — Ambulatory Visit (INDEPENDENT_AMBULATORY_CARE_PROVIDER_SITE_OTHER): Payer: Medicare Other | Admitting: Family Medicine

## 2020-01-10 ENCOUNTER — Encounter: Payer: Self-pay | Admitting: Family Medicine

## 2020-01-10 DIAGNOSIS — G4701 Insomnia due to medical condition: Secondary | ICD-10-CM

## 2020-01-10 DIAGNOSIS — G1221 Amyotrophic lateral sclerosis: Secondary | ICD-10-CM

## 2020-01-10 MED ORDER — TRAZODONE HCL 50 MG PO TABS: 50.0000 mg | ORAL_TABLET | Freq: Every day | ORAL | 1 refills | Status: DC

## 2020-01-10 NOTE — Progress Notes (Signed)
Name: Jerry [name]   MRN: [id number]    DOB: 08-10-1970   Date:01/10/2020       Progress Note  Subjective  Chief Complaint  Chief Complaint  Patient presents with  . ALS  . Form Completion    Handicapp form and CME    I connected with  [name] on 01/10/20 at 10:20 AM EDT by telephone and verified that I am speaking with the correct person using two identifiers.  I discussed the limitations, risks, security and privacy concerns of performing an evaluation and management service by telephone and the availability of in person appointments. Staff also discussed with the patient that there may be a patient responsible charge related to this service. Patient Location: at home  Provider Location: [facility] Additional Individuals present:wife   HPI  ALS: last visit with neurologist was 08/22/2019. He is getting PT, OT, during his last visit with neurologist he was advised to get  [facility] lift, power wheelchair, a  rampRecommended a hoyer lift and hospital bed.  He was able to get the ramp and the lift chair, power wheelchair. He states he is no longer having PT or OT .   He needs assistance with feeding, bathing, changing clothes  , transferring , also needs help being wiped after using restroom. He has noticed increase in dysarthria , no problems choking or swallowing. He is very weak on both hands, he is able to walk a little with assistance   Patient Active Problem List   Diagnosis Date Noted  . ALS (amyotrophic lateral sclerosis) ([place]) 01/11/2019  . Fasciculations of muscle 09/08/2018  . Weakness 09/08/2018  . Thenar muscle atrophy of left hand 09/08/2018  . Prediabetes 11/05/2016  . Family history of diabetes mellitus 11/04/2016    Past Surgical History:  Procedure Laterality Date  . MASS EXCISION Right 11/26/2014   Procedure: EXCISION MASS/GROIN EXCISION MASS;  Surgeon: Robert Bellow, MD;  Location: [facility] ORS;  Service: General;  Laterality: Right;  . MASS EXCISION Right  11/26/2014   Procedure: MINOR EXCISION OF MASS/POST THIGH MASS;  Surgeon: Robert Bellow, MD;  Location: [facility] ORS;  Service: General;  Laterality: Right;  . PORTACATH PLACEMENT Right 06/19/2019    Family History  Problem Relation Age of Onset  . Diabetes Father   . Diabetes Mother   . Diabetes Sister   . Hypertension Sister   . Diabetes Brother   . Diabetes Sister   . Fibromyalgia Sister   . Diabetes Sister       Current Outpatient Medications:  .  baclofen (LIORESAL) 10 MG tablet, Take by mouth., Disp: , Rfl:  .  Edaravone (RADICAVA IV), Inject 60 mg into the vein., Disp: , Rfl:  .  EPINEPHrine (EPIPEN 2-PAK) 0.3 mg/0.3 mL IJ SOAJ injection, Inject 0.3 mLs (0.3 mg total) into the muscle as needed for anaphylaxis., Disp: 1 each, Rfl: 0 .  loratadine (CLARITIN) 10 MG tablet, Take 1 tablet (10 mg total) by mouth daily., Disp: 30 tablet, Rfl: 11 .  NON FORMULARY, Take 1 capsule by mouth daily. Vitamin D3 125 mcg and K2 50 mcg, Disp: , Rfl:  .  riluzole (RILUTEK) 50 MG tablet, Take by mouth., Disp: , Rfl:  .  traZODone (DESYREL) 50 MG tablet, TAKE 1/2 TO 1 TABLET(25 TO 50 MG) BY MOUTH AT BEDTIME AS NEEDED FOR SLEEP, Disp: 30 tablet, Rfl: 0 .  Vitamin D, Ergocalciferol, (DRISDOL) 1.25 MG (50000 UNIT) CAPS capsule, Take 1 capsule (50,000 Units total) by  mouth every 7 (seven) days., Disp: 12 capsule, Rfl: 1  Allergies  Allergen Reactions  . Crab [Shellfish Allergy] Rash    I personally reviewed active problem list, medication list, allergies, family history, social history, health maintenance with the patient/caregiver today.   ROS  Ten systems reviewed and is negative except as mentioned in HPI   Objective  Virtual encounter, vitals not obtained.  There is no height or weight on file to calculate BMI.  Physical Exam  Awake, alert and oriented, he has some dysarthria  PHQ2/9: Depression screen [facility] 2/9 01/10/2020 07/19/2019 06/27/2019 06/27/2019 04/04/2019  Decreased  Interest 0 0 0 0 0  Down, Depressed, Hopeless 0 0 0 0 0  PHQ - 2 Score 0 0 0 0 0  Altered sleeping 0 0 - 3 1  Tired, decreased energy 0 0 - 3 0  Change in appetite 0 0 - 0 0  Feeling bad or failure about yourself  0 0 - 0 0  Trouble concentrating 0 0 - 0 0  Moving slowly or fidgety/restless 0 0 - 0 0  Suicidal thoughts 0 0 - 0 0  PHQ-9 Score 0 0 - 6 1  Difficult doing work/chores - Not difficult at all - Not difficult at all Not difficult at all   PHQ-2/9 Result is negative.    Fall Risk: Fall Risk  01/10/2020 07/19/2019 06/27/2019 06/27/2019 06/27/2019  Falls in the past year? 1 1 1 1 1   Number falls in past yr: 0 1 - - 1  Comment - - - - 4 within the last year  Injury with Fall? 0 0 0 - 0  Comment - - - - -  Risk for fall due to : - Other (Comment);Impaired balance/gait;Impaired mobility;Medication side effect;History of fall(s) Impaired balance/gait;Medication side effect;Impaired mobility;History of fall(s);Other (Comment) - -  Risk for fall due to: Comment - ALS Dx with ALS - -  Follow up - - Falls prevention discussed - -     Assessment & Plan  1. ALS (amyotrophic lateral sclerosis) ([place])  Filled out forms for in home assistance, we faxed paperwork today hopefully he will get the care he needs at home    2. Insomnia due to medical condition  - traZODone (DESYREL) 50 MG tablet; Take 1 tablet (50 mg total) by mouth at bedtime.  Dispense: 90 tablet; Refill: 1 Doing better on medication   I discussed the assessment and treatment plan with the patient. The patient was provided an opportunity to ask questions and all were answered. The patient agreed with the plan and demonstrated an understanding of the instructions.   The patient was advised to call back or seek an in-person evaluation if the symptoms worsen or if the condition fails to improve as anticipated.  I provided 15 minutes of non-face-to-face time during this encounter.  Loistine Chance, MD

## 2020-01-11 ENCOUNTER — Ambulatory Visit: Payer: Medicare Other

## 2020-01-11 DIAGNOSIS — G1221 Amyotrophic lateral sclerosis: Secondary | ICD-10-CM

## 2020-01-11 NOTE — Chronic Care Management (AMB) (Signed)
   Care Management   Follow Up Note   01/11/2020 Name: Jerry [name] MRN: [id number] DOB: 1971-03-06  Primary Care Provider: [name], MD Reason for referral : Care Management   Jerry [name] is a 49 y.o. year old male who is a primary care patient of Jerry Sizer, MD. He is currently engaged with the care management team. A brief telephonic outreach was conducted today.  Review of [name] status, including review of consultants reports, relevant labs and test results was conducted today. Collaboration with appropriate care team members was performed as part of the comprehensive evaluation and provision of chronic care management services.    SDOH (Social Determinants of Health) assessments performed: No     Outpatient Encounter Medications as of 01/11/2020  Medication Sig  . Edaravone (RADICAVA IV) Inject 60 mg into the vein.  Marland Kitchen EPINEPHrine (EPIPEN 2-PAK) 0.3 mg/0.3 mL IJ SOAJ injection Inject 0.3 mLs (0.3 mg total) into the muscle as needed for anaphylaxis.  Marland Kitchen loratadine (CLARITIN) 10 MG tablet Take 1 tablet (10 mg total) by mouth daily.  . NON FORMULARY Take 1 capsule by mouth daily. Vitamin D3 125 mcg and K2 50 mcg  . riluzole (RILUTEK) 50 MG tablet Take by mouth.  . traZODone (DESYREL) 50 MG tablet Take 1 tablet (50 mg total) by mouth at bedtime.  . Vitamin D, Ergocalciferol, (DRISDOL) 1.25 MG (50000 UNIT) CAPS capsule Take 1 capsule (50,000 Units total) by mouth every 7 (seven) days.   No facility-administered encounter medications on file as of 01/11/2020.     Goals Addressed            This Visit's Progress   . Obtain Long Term In-Home Services       Current Barriers:  . Chronic Disease Management support related to Amyotrophic lateral sclerosis (ALS).  Case Manager Clinical Goal(s):  Marland Kitchen Over the next 90 days, patient will not be hospitalized for complications related to chronic illnesses.-Complete . Over the next 90 days, patient will take all medications as  prescribed.-Complete . Over the next 90 days, patient will attend all medical appointments as scheduled.-Complete . Over the next 90 days, patient will follow recommended safety precautions to prevent falls and injuries.-Complete . Over the next 60 days, patient will work with Chronic Care Management team to address plan for home health and long term care services.-Complete   Interventions:  .  Discussed current personal care needs and plan for long term assistance. Anticipating ongoing services with [facility]. Reports nurse completed the initial assessment on 01/09/20. Will follow-up to confirm that agency on 01/14/20 to confirm that they will be able to provide long term services.   Patient Self Care Activities:  . Attends scheduled provider appointments . Calls pharmacy for medication refills . Calls provider office for new concerns or questions . Unable to perform ADLs independently . Unable to perform IADLs independently  Please see past updates related to this goal by clicking on the "Past Updates" button in the selected goal         PLAN The care management team will follow-up next week.   [facility] Care Management [phone]

## 2020-01-14 ENCOUNTER — Ambulatory Visit: Payer: Medicare Other

## 2020-01-14 NOTE — Patient Instructions (Addendum)
Thank you for allowing the Chronic Care Management team to participate in your care.  Goals Addressed            This Visit's Progress   . Obtain Long Term In-Home Services       Current Barriers:  . Chronic Disease Management support related to Amyotrophic lateral sclerosis (ALS).  Case Manager Clinical Goal(s):  Marland Kitchen Over the next 90 days, patient will not be hospitalized for complications related to chronic illnesses.-Complete . Over the next 90 days, patient will take all medications as prescribed.-Complete . Over the next 90 days, patient will attend all medical appointments as scheduled.-Complete . Over the next 90 days, patient will follow recommended safety precautions to prevent falls and injuries.-Complete . Over the next 60 days, patient will work with Chronic Care Management team to address plan for home health and long term care services.-Complete   Interventions:  .  Discussed current personal care needs and plan for long term assistance. Anticipating ongoing services with [facility]. Reports nurse completed the initial assessment on 01/09/20. Will follow-up to confirm that agency on 01/14/20 to confirm that they will be able to provide long term services.   Patient Self Care Activities:  . Attends scheduled provider appointments . Calls pharmacy for medication refills . Calls provider office for new concerns or questions . Unable to perform ADLs independently . Unable to perform IADLs independently  Please see past updates related to this goal by clicking on the "Past Updates" button in the selected goal         Mr. Sarvis verbalized understanding of the information discussed during the telephonic outreach today. Did not indicate need for mailed/printed information.   The care management team will follow-up next week.   [facility] Care Management [phone]

## 2020-01-18 ENCOUNTER — Other Ambulatory Visit: Payer: Self-pay | Admitting: Family Medicine

## 2020-01-18 DIAGNOSIS — E559 Vitamin D deficiency, unspecified: Secondary | ICD-10-CM

## 2020-01-18 NOTE — Telephone Encounter (Signed)
Requested  medications are  due for refill today yes  Requested medications are on the active medication list yes  Last refill 4/18  Last visit 01/2020  Last lab 09/2018  Future visit scheduled no  Notes to clinic Not delegated

## 2020-01-21 NOTE — Chronic Care Management (AMB) (Signed)
Unable to reach agency. Please disregard this encounter.

## 2020-02-01 ENCOUNTER — Telehealth: Payer: Self-pay

## 2020-02-01 NOTE — Telephone Encounter (Signed)
°   Care Management   Outreach Note  02/01/2020 Name: Jerry [name] MRN: [id number] DOB: 1971-06-08  Primary Care Provider: [name], MD Reason for referral : Care Management   An unsuccessful telephone outreach was attempted today. Jerry [name] is currently engaged with the chronic care management team.   A HIPAA compliant voice message was left today requesting a return call.   Follow Up Plan: The care management team will reach out to Jerry [name] again next week.   [facility] Care Management [phone]

## 2020-02-06 ENCOUNTER — Ambulatory Visit: Payer: Self-pay

## 2020-02-06 NOTE — Chronic Care Management (AMB) (Signed)
  Chronic Care Management   Note  02/06/2020 Name: Jerry [name] MRN: [id number] DOB: [birth year]   Voice message received from the [place] family regarding status of in-home services.   Attempted to return the call but unable to reach them today. Left a voice message requesting a return call when they are available.    Follow up plan: Anticipate outreach within the next week.   [facility] Care Management [phone]

## 2020-02-11 ENCOUNTER — Telehealth: Payer: Self-pay

## 2020-02-11 ENCOUNTER — Ambulatory Visit: Payer: Medicare Other

## 2020-02-11 DIAGNOSIS — G1221 Amyotrophic lateral sclerosis: Secondary | ICD-10-CM

## 2020-02-11 NOTE — Chronic Care Management (AMB) (Signed)
Care Management   Follow Up Note   02/11/2020 Name: Jerry [name] MRN: [id number] DOB: 1970-08-24  Primary Care Provider: [name], MD Reason for referral : Care Management   Jerry [name] is a 49 y.o. year old male who is a primary care patient of [name], MD. He is currently engaged with the care management team.  Review of Mr. Nolasco status, including review of consultants reports, relevant labs and test results was conducted today. Collaboration with appropriate care team members was performed as part of the comprehensive evaluation and provision of chronic care management services.    SDOH (Social Determinants of Health) assessments performed: No    Outpatient Encounter Medications as of 02/11/2020  Medication Sig  . Edaravone (RADICAVA IV) Inject 60 mg into the vein.  Marland Kitchen EPINEPHrine (EPIPEN 2-PAK) 0.3 mg/0.3 mL IJ SOAJ injection Inject 0.3 mLs (0.3 mg total) into the muscle as needed for anaphylaxis.  Marland Kitchen loratadine (CLARITIN) 10 MG tablet Take 1 tablet (10 mg total) by mouth daily.  . NON FORMULARY Take 1 capsule by mouth daily. Vitamin D3 125 mcg and K2 50 mcg  . riluzole (RILUTEK) 50 MG tablet Take by mouth.  . traZODone (DESYREL) 50 MG tablet Take 1 tablet (50 mg total) by mouth at bedtime.  . Vitamin D, Ergocalciferol, (DRISDOL) 1.25 MG (50000 UNIT) CAPS capsule TAKE 1 CAPSULE BY MOUTH EVERY 7 DAYS   No facility-administered encounter medications on file as of 02/11/2020.       Goals Addressed            This Visit's Progress   . Obtain Long Term In-Home Services       Current Barriers:  . Chronic Disease Management support related to Amyotrophic lateral sclerosis (ALS).  Case Manager Clinical Goal(s):  Marland Kitchen Over the next 90 days, patient will not be hospitalized for complications related to chronic illnesses.-Complete . Over the next 90 days, patient will take all medications as prescribed.-Complete . Over the next 90 days, patient will attend all medical  appointments as scheduled.-Complete . Over the next 90 days, patient will follow recommended safety precautions to prevent falls and injuries.-Complete . Over the next 60 days, patient will work with Chronic Care Management team to address plan for home health and long term care services.-Complete   Interventions:  .  Discussed current personal care needs and plan for long term assistance. Discussed start of services with [facility]. Reports receiving in-home care, however the services have not been consistent. He was approved for 72hrs/month. Reports not receiving several days of service d/t the care aides calling out or not arriving as scheduled. The family also expressed concerns regarding questionable behavior and the care aide's personal condition when arriving at the home. As of today, his care aid her been reassigned 4 times. The agency coordinator, Cathie Olden is currently out of the office, but the family is hoping to have another discussion with her when she returns. Discussed the possibility of Mr. Zwahlen spouse Jerry [name] receiving funds to serve as his primary caregiver if the concerns can not be resolved. Will follow-up after guidance is received from the LCSW.   Patient Self Care Activities:  . Attends scheduled provider appointments . Calls pharmacy for medication refills . Calls provider office for new concerns or questions . Unable to perform ADLs independently . Unable to perform IADLs independently  Please see past updates related to this goal by clicking on the "Past Updates" button in the selected goal  PLAN The care management team will follow-up within the next week.    [facility] Care Management [facility] [phone]

## 2020-02-11 NOTE — Telephone Encounter (Signed)
°   Care Management   Outreach Note  02/11/2020 Name: Acxel Dingee MRN: [id number] DOB: [birth year]  Primary Care Provider: [name], MD Reason for referral : Care Management   An unsuccessful telephone outreach was attempted today.Mr. Sanfilippo is currently engaged with the care management team. Attempted outreach today regarding in-home services.  HIPAA compliant voice messages were left today for Mr. Carlota Raspberry and his spouse requesting a return call when available.   PLAN Anticipate outreach within the next week.    [facility] Care Management [phone]

## 2020-02-18 ENCOUNTER — Telehealth: Payer: Self-pay

## 2020-02-18 NOTE — Patient Instructions (Addendum)
Thank you for allowing the Chronic Care Management team to participate in your care.  Goals Addressed            This Visit's Progress    Obtain Long Term In-Home Services       Current Barriers:   Chronic Disease Management support related to Amyotrophic lateral sclerosis (ALS).  Case Manager Clinical Goal(s):   Over the next 90 days, patient will not be hospitalized for complications related to chronic illnesses.-Complete  Over the next 90 days, patient will take all medications as prescribed.-Complete  Over the next 90 days, patient will attend all medical appointments as scheduled.-Complete  Over the next 90 days, patient will follow recommended safety precautions to prevent falls and injuries.-Complete  Over the next 60 days, patient will work with Chronic Care Management team to address plan for home health and long term care services.-Complete   Interventions:    Discussed current personal care needs and plan for long term assistance. Discussed start of services with [facility]. Reports receiving in-home care, however the services have not been consistent. He was approved for 72hrs/month. Reports not receiving several days of service d/t the care aides calling out or not arriving as scheduled. The family also expressed concerns regarding questionable behavior and the care aide's personal condition when arriving at the home. As of today, his care aid her been reassigned 4 times. The agency coordinator, Jerry [name] is currently out of the office, but the family is hoping to have another discussion with her when she returns. Discussed the possibility of Jerry [name] spouse Jerry [name] receiving funds to serve as his primary caregiver if the concerns can not be resolved. Will follow-up after guidance is received from the LCSW.   Patient Self Care Activities:   Attends scheduled provider appointments  Calls pharmacy for medication refills  Calls provider office for new concerns or  questions  Unable to perform ADLs independently  Unable to perform IADLs independently  Please see past updates related to this goal by clicking on the "Past Updates" button in the selected goal         Jerry [name] verbalized understanding of the information discussed during the telephonic outreach today. Declined need for a mailed/printed copy of the instructions.   The care management team will follow-up within the next week.    [name] Care Management [facility] [phone]

## 2020-02-20 ENCOUNTER — Ambulatory Visit: Payer: Medicare Other

## 2020-03-03 ENCOUNTER — Telehealth: Payer: Self-pay | Admitting: Family Medicine

## 2020-03-03 NOTE — Telephone Encounter (Signed)
Copied from [place] [id number]. Topic: Medicare AWV >> Mar 03, 2020 10:18 AM Cher Nakai R wrote: Reason for CRM:  Left message for patient to call back and schedule Medicare Annual Wellness Visit (AWV) in office or virtually.  No hx of AWV eligible as of 10/04/2019  Please schedule at anytime with [place].      40 Minutes appointment   Any questions, please call me at [phone]

## 2020-03-11 NOTE — Chronic Care Management (AMB) (Signed)
   Care Management   Note   Name: Braxten Memmer MRN: [id number] DOB: 12-09-1970    Follow-up outreach with the [facility] team and [facility] Liaison regarding options for [facility] services. [place] has approved services via [facility]. Per the representative, Janeece Riggers is currently unable to authorize relatives, primary caregivers, or legal guardians for this role. Recommended contacting the [facility] ([facility]) for additional options.  Will outreach with CAP to discuss options and submit referral if indicated.   PLAN Will outreach with CAP and follow-up with the Waldman family to discuss options.   Cristy Friedlander [facility] Care Management [facility] [phone]

## 2020-03-20 NOTE — Progress Notes (Signed)
Patient ID: Jerry [name], male    DOB: Dec 03, 1970, 49 y.o.   MRN: [id number]  PCP: [name], MD  Chief Complaint  Patient presents with  . Hypertension    Subjective:   Jerry [name] is a 49 y.o. male, presents to clinic with CC of the following:  HPI  Patient presents with concerns of high blood pressure.  His blood pressure today in clinic is 130/82.  He brings in readings from home and readings from home nursing staff that does his treatments for ALS with systolic blood pressures in the 150s this is noted to be only when he is accessing his port and doing his treatments and infusions.  Patient states that he does not feel anxious he does these medications 2 weeks out of every month, he is has not had any chest pain, shortness of breath, chest pressure, orthopnea, headaches, visual disturbances  No prior diagnoses or medications for hypertension, blood pressure is well controlled and at goal for patient today BP Readings from Last 3 Encounters:  03/21/20 130/82  07/19/19 120/80  04/04/19 132/78   Pt denies CP, SOB, exertional sx, LE edema, palpitation, Ha's, visual disturbances, lightheadedness, hypotension, syncope.    Patient Active Problem List   Diagnosis Date Noted  . ALS (amyotrophic lateral sclerosis) ([place]) 01/11/2019  . Fasciculations of muscle 09/08/2018  . Weakness 09/08/2018  . Thenar muscle atrophy of left hand 09/08/2018  . Prediabetes 11/05/2016  . Family history of diabetes mellitus 11/04/2016      Current Outpatient Medications:  .  Edaravone (RADICAVA IV), Inject 60 mg into the vein., Disp: , Rfl:  .  EPINEPHrine (EPIPEN 2-PAK) 0.3 mg/0.3 mL IJ SOAJ injection, Inject 0.3 mLs (0.3 mg total) into the muscle as needed for anaphylaxis., Disp: 1 each, Rfl: 0 .  loratadine (CLARITIN) 10 MG tablet, Take 1 tablet (10 mg total) by mouth daily., Disp: 30 tablet, Rfl: 11 .  NON FORMULARY, Take 1 capsule by mouth daily. Vitamin D3 125 mcg and K2 50 mcg,  Disp: , Rfl:  .  riluzole (RILUTEK) 50 MG tablet, Take by mouth., Disp: , Rfl:  .  traZODone (DESYREL) 50 MG tablet, Take 1 tablet (50 mg total) by mouth at bedtime., Disp: 90 tablet, Rfl: 1 .  Vitamin D, Ergocalciferol, (DRISDOL) 1.25 MG (50000 UNIT) CAPS capsule, TAKE 1 CAPSULE BY MOUTH EVERY 7 DAYS, Disp: 12 capsule, Rfl: 1   Allergies  Allergen Reactions  . Crab [Shellfish Allergy] Rash     Social History   Tobacco Use  . Smoking status: Never Smoker  . Smokeless tobacco: Never Used  Vaping Use  . Vaping Use: Never used  Substance Use Topics  . Alcohol use: No    Alcohol/week: 0.0 standard drinks  . Drug use: No      Chart Review Today: I personally reviewed active problem list, medication list, allergies, family history, social history, health maintenance, notes from last encounter, lab results, imaging with the patient/caregiver today.    Review of Systems 10 Systems reviewed and are negative for acute change except as noted in the HPI.     Objective:   Vitals:   03/21/20 1541  BP: 130/82  Pulse: 66  Resp: 18  Temp: 98.2 F (36.8 C)  TempSrc: Oral  SpO2: 98%  Weight: 181 lb 14.4 oz (82.5 kg)  Height: 5\' 5"  (1.651 m)    Body mass index is 30.27 kg/m.  Physical Exam Vitals and nursing note reviewed.  Constitutional:  General: He is not in acute distress.    Appearance: He is not toxic-appearing.  HENT:     Head: Normocephalic and atraumatic.  Cardiovascular:     Rate and Rhythm: Normal rate and regular rhythm.     Pulses: Normal pulses.     Heart sounds: Normal heart sounds. No murmur heard.  No friction rub. No gallop.   Pulmonary:     Breath sounds: No wheezing or rales.     Comments: Somewhat diminished breath sounds bilaterally at the bases, when asked to take a deep breath there is increased inspiratory effort with some accessory muscle usage (only when asked to take deep breath not when breathing normally at rest), no  rales/rhonchi/wheeze, no coughing Abdominal:     General: Bowel sounds are normal. There is no distension.     Palpations: Abdomen is soft.     Tenderness: There is no abdominal tenderness.  Neurological:     Mental Status: He is alert.     Cranial Nerves: Dysarthria present.     Motor: Weakness present.     Comments: Patient in wheelchair  Psychiatric:        Mood and Affect: Mood normal.      Results for orders placed or performed in visit on 06/28/19  Novel Coronavirus, NAA ([facility])   Specimen: Nasopharyngeal(NP) swabs in vial transport medium   NASOPHARYNGE  TESTING  Result Value Ref Range   SARS-CoV-2, NAA Not Detected Not Detected       Assessment & Plan:   1. Elevated blood pressure reading -without diagnosis of hypertension Blood pressure is at goal and well controlled here in clinic today, his readings have come from nursing staff that comes to his home to do his treatments for ALS.  Inquired if they would be able to find a blood pressure cuff or monitor at home outside of when the nursing staff comes to his home, finances are limited but they will try to find or borrow a cuff from somebody.  They were welcome to come back here for a nurse visit to follow-up however I reviewed the chart in clinic even today with asking him to stand on the scale and increase physical effort to do that for [place] and then get into exam room today his blood pressure was ~130/80s We discussed DASH efforts that they may try to work on - however I suspect intermittent elevated blood pressure is likely secondary to little bit of anxiety or whitecoat hypertension  Monitor BP if able, return to clinic is on average BP is >140/90 - f/up with PCP   Delsa Grana, PA-C 03/20/20 4:10 PM

## 2020-03-21 ENCOUNTER — Ambulatory Visit (INDEPENDENT_AMBULATORY_CARE_PROVIDER_SITE_OTHER): Payer: Medicare Other | Admitting: Family Medicine

## 2020-03-21 ENCOUNTER — Other Ambulatory Visit: Payer: Self-pay

## 2020-03-21 ENCOUNTER — Encounter: Payer: Self-pay | Admitting: Family Medicine

## 2020-03-21 VITALS — BP 130/82 | HR 66 | Temp 98.2°F | Resp 18 | Ht 65.0 in | Wt 181.9 lb

## 2020-03-21 DIAGNOSIS — R030 Elevated blood-pressure reading, without diagnosis of hypertension: Secondary | ICD-10-CM

## 2020-03-21 NOTE — Patient Instructions (Addendum)
Blood Pressure Record Sheet  Blood pressure log Date: _______________________  a.m. _____________________(1st reading) _____________________(2nd reading)  p.m. _____________________(1st reading) _____________________(2nd reading) Date: _______________________  a.m. _____________________(1st reading) _____________________(2nd reading)  p.m. _____________________(1st reading) _____________________(2nd reading) Date: _______________________  a.m. _____________________(1st reading) _____________________(2nd reading)  p.m. _____________________(1st reading) _____________________(2nd reading) Date: _______________________  a.m. _____________________(1st reading) _____________________(2nd reading)  p.m. _____________________(1st reading) _____________________(2nd reading) Date: _______________________  a.m. _____________________(1st reading) _____________________(2nd reading)  p.m. _____________________(1st reading) _____________________(2nd reading) Date: _______________________  a.m. _____________________(1st reading) _____________________(2nd reading)  p.m. _____________________(1st reading) _____________________(2nd reading) Date: _______________________  a.m. _____________________(1st reading) _____________________(2nd reading)  p.m. _____________________(1st reading) _____________________(2nd reading) Date: _______________________  a.m. _____________________(1st reading) _____________________(2nd reading)  p.m. _____________________(1st reading) _____________________(2nd reading) Date: _______________________  a.m. _____________________(1st reading) _____________________(2nd reading)  p.m. _____________________(1st reading) _____________________(2nd reading) Date: _______________________  a.m. _____________________(1st reading) _____________________(2nd reading)  p.m. _____________________(1st reading) _____________________(2nd reading)    How to Take Your Blood  Pressure You can take your blood pressure at home with a machine. You may need to check your blood pressure at home:  To check if you have high blood pressure (hypertension).  To check your blood pressure over time.  To make sure your blood pressure medicine is working. Supplies needed: You will need a blood pressure machine, or monitor. You can buy one at a drugstore or online. When choosing one:  Choose one with an arm cuff.  Choose one that wraps around your upper arm. Only one finger should fit between your arm and the cuff.  Do not choose one that measures your blood pressure from your wrist or finger. Your doctor can suggest a monitor. How to prepare Avoid these things for 30 minutes before checking your blood pressure:  Drinking caffeine.  Drinking alcohol.  Eating.  Smoking.  Exercising. Five minutes before checking your blood pressure:  Pee.  Sit in a dining chair. Avoid sitting in a soft couch or armchair.  Be quiet. Do not talk. How to take your blood pressure Follow the instructions that came with your machine. If you have a digital blood pressure monitor, these may be the instructions: 1. Sit up straight. 2. Place your feet on the floor. Do not cross your ankles or legs. 3. Rest your left arm at the level of your heart. You may rest it on a table, desk, or chair. 4. Pull up your shirt sleeve. 5. Wrap the blood pressure cuff around the upper part of your left arm. The cuff should be 1 inch (2.5 cm) above your elbow. It is best to wrap the cuff around bare skin. 6. Fit the cuff snugly around your arm. You should be able to place only one finger between the cuff and your arm. 7. Put the cord inside the groove of your elbow. 8. Press the power button. 9. Sit quietly while the cuff fills with air and loses air. 10. Write down the numbers on the screen. 11. Wait 2-3 minutes and then repeat steps 1-10. What do the numbers mean? Two numbers make up your blood  pressure. The first number is called systolic pressure. The second is called diastolic pressure. An example of a blood pressure reading is "120 over 80" (or 120/80). If you are an adult and do not have a medical condition, use this guide to find out if your blood pressure is normal: Normal  First number: below 120.  Second number: below 80. Elevated  First number: 120-129.  Second number: below 80. Hypertension stage 1  First number: 130-139.  Second  number: 80-89. Hypertension stage 2  First number: 140 or above.  Second number: 77 or above. Your blood pressure is above normal even if only the top or bottom number is above normal. Follow these instructions at home:  Check your blood pressure as often as your doctor tells you to.  Take your monitor to your next doctor's appointment. Your doctor will: ? Make sure you are using it correctly. ? Make sure it is working right.  Make sure you understand what your blood pressure numbers should be.  Tell your doctor if your medicines are causing side effects. Contact a doctor if:  Your blood pressure keeps being high. Get help right away if:  Your first blood pressure number is higher than 180.  Your second blood pressure number is higher than 120. This information is not intended to replace advice given to you by your health care provider. Make sure you discuss any questions you have with your health care provider. Document Revised: 06/03/2017 Document Reviewed: 11/28/2015 Elsevier Patient Education  [place] DASH stands for "Dietary Approaches to Stop Hypertension." The DASH eating plan is a healthy eating plan that has been shown to reduce high blood pressure (hypertension). It may also reduce your risk for type 2 diabetes, heart disease, and stroke. The DASH eating plan may also help with weight loss. What are tips for following this plan?  General guidelines  Avoid eating more than  2,300 mg (milligrams) of salt (sodium) a day. If you have hypertension, you may need to reduce your sodium intake to 1,500 mg a day.  Limit alcohol intake to no more than 1 drink a day for nonpregnant women and 2 drinks a day for men. One drink equals 12 oz of beer, 5 oz of wine, or 1 oz of hard liquor.  Work with your health care provider to maintain a healthy body weight or to lose weight. Ask what an ideal weight is for you.  Get at least 30 minutes of exercise that causes your heart to beat faster (aerobic exercise) most days of the week. Activities may include walking, swimming, or biking.  Work with your health care provider or diet and nutrition specialist (dietitian) to adjust your eating plan to your individual calorie needs. Reading food labels   Check food labels for the amount of sodium per serving. Choose foods with less than 5 percent of the Daily Value of sodium. Generally, foods with less than 300 mg of sodium per serving fit into this eating plan.  To find whole grains, look for the word "whole" as the first word in the ingredient list. Shopping  Buy products labeled as "low-sodium" or "no salt added."  Buy fresh foods. Avoid canned foods and premade or frozen meals. Cooking  Avoid adding salt when cooking. Use salt-free seasonings or herbs instead of table salt or sea salt. Check with your health care provider or pharmacist before using salt substitutes.  Do not fry foods. Cook foods using healthy methods such as baking, boiling, grilling, and broiling instead.  Cook with heart-healthy oils, such as olive, canola, soybean, or sunflower oil. Meal planning  Eat a balanced diet that includes: ? 5 or more servings of fruits and vegetables each day. At each meal, try to fill half of your plate with fruits and vegetables. ? Up to 6-8 servings of whole grains each day. ? Less than 6 oz of lean meat, poultry, or fish each day. A 3-oz  serving of meat is about the same size  as a deck of cards. One egg equals 1 oz. ? 2 servings of low-fat dairy each day. ? A serving of nuts, seeds, or beans 5 times each week. ? Heart-healthy fats. Healthy fats called Omega-3 fatty acids are found in foods such as flaxseeds and coldwater fish, like sardines, salmon, and mackerel.  Limit how much you eat of the following: ? Canned or prepackaged foods. ? Food that is high in trans fat, such as fried foods. ? Food that is high in saturated fat, such as fatty meat. ? Sweets, desserts, sugary drinks, and other foods with added sugar. ? Full-fat dairy products.  Do not salt foods before eating.  Try to eat at least 2 vegetarian meals each week.  Eat more home-cooked food and less restaurant, buffet, and fast food.  When eating at a restaurant, ask that your food be prepared with less salt or no salt, if possible. What foods are recommended? The items listed may not be a complete list. Talk with your dietitian about what dietary choices are best for you. Grains Whole-grain or whole-wheat bread. Whole-grain or whole-wheat pasta. Brown rice. [place]. Bulgur. Whole-grain and low-sodium cereals. Pita bread. Low-fat, low-sodium crackers. Whole-wheat flour tortillas. Vegetables Fresh or frozen vegetables (raw, steamed, roasted, or grilled). Low-sodium or reduced-sodium tomato and vegetable juice. Low-sodium or reduced-sodium tomato sauce and tomato paste. Low-sodium or reduced-sodium canned vegetables. Fruits All fresh, dried, or frozen fruit. Canned fruit in natural juice (without added sugar). Meat and other protein foods Skinless chicken or [place]. Ground chicken or [place]. Pork with fat trimmed off. Fish and seafood. Egg whites. Dried beans, peas, or lentils. Unsalted nuts, nut butters, and seeds. Unsalted canned beans. Lean cuts of beef with fat trimmed off. Low-sodium, lean deli meat. Dairy Low-fat (1%) or fat-free (skim) milk. Fat-free, low-fat, or reduced-fat cheeses.  Nonfat, low-sodium ricotta or cottage cheese. Low-fat or nonfat yogurt. Low-fat, low-sodium cheese. Fats and oils Soft margarine without trans fats. Vegetable oil. Low-fat, reduced-fat, or light mayonnaise and salad dressings (reduced-sodium). Canola, safflower, olive, soybean, and sunflower oils. Avocado. Seasoning and other foods Herbs. Spices. Seasoning mixes without salt. Unsalted popcorn and pretzels. Fat-free sweets. What foods are not recommended? The items listed may not be a complete list. Talk with your dietitian about what dietary choices are best for you. Grains Baked goods made with fat, such as croissants, muffins, or some breads. Dry pasta or rice meal packs. Vegetables Creamed or fried vegetables. Vegetables in a cheese sauce. Regular canned vegetables (not low-sodium or reduced-sodium). Regular canned tomato sauce and paste (not low-sodium or reduced-sodium). Regular tomato and vegetable juice (not low-sodium or reduced-sodium). Angie Fava. Olives. Fruits Canned fruit in a light or heavy syrup. Fried fruit. Fruit in cream or butter sauce. Meat and other protein foods Fatty cuts of meat. Ribs. Fried meat. Berniece Salines. Sausage. Bologna and other processed lunch meats. Salami. Fatback. Hotdogs. Bratwurst. Salted nuts and seeds. Canned beans with added salt. Canned or smoked fish. Whole eggs or egg yolks. Chicken or [place] with skin. Dairy Whole or 2% milk, cream, and half-and-half. Whole or full-fat cream cheese. Whole-fat or sweetened yogurt. Full-fat cheese. Nondairy creamers. Whipped toppings. Processed cheese and cheese spreads. Fats and oils Butter. Stick margarine. Lard. Shortening. Ghee. Bacon fat. Tropical oils, such as coconut, palm kernel, or palm oil. Seasoning and other foods Salted popcorn and pretzels. Onion salt, garlic salt, seasoned salt, table salt, and sea salt. Worcestershire sauce. Tartar sauce. Barbecue  sauce. Teriyaki sauce. Soy sauce, including reduced-sodium. Steak  sauce. Canned and packaged gravies. Fish sauce. Oyster sauce. Cocktail sauce. Horseradish that you find on the shelf. Ketchup. Mustard. Meat flavorings and tenderizers. Bouillon cubes. Hot sauce and Tabasco sauce. Premade or packaged marinades. Premade or packaged taco seasonings. Relishes. Regular salad dressings. Where to find more information:  National Heart, Lung, and [place]: https://wilson-eaton.com/  American Heart Association: www.heart.org Summary  The DASH eating plan is a healthy eating plan that has been shown to reduce high blood pressure (hypertension). It may also reduce your risk for type 2 diabetes, heart disease, and stroke.  With the DASH eating plan, you should limit salt (sodium) intake to 2,300 mg a day. If you have hypertension, you may need to reduce your sodium intake to 1,500 mg a day.  When on the DASH eating plan, aim to eat more fresh fruits and vegetables, whole grains, lean proteins, low-fat dairy, and heart-healthy fats.  Work with your health care provider or diet and nutrition specialist (dietitian) to adjust your eating plan to your individual calorie needs. This information is not intended to replace advice given to you by your health care provider. Make sure you discuss any questions you have with your health care provider. Document Revised: 06/03/2017 Document Reviewed: 06/14/2016 Elsevier Patient Education  2020 [employer].

## 2020-03-31 ENCOUNTER — Telehealth: Payer: Self-pay

## 2020-03-31 NOTE — Telephone Encounter (Signed)
°   Care Management   Outreach Note  03/31/2020 Name: Jerry [name] MRN: [id number] DOB: [birth year]  Primary Care Provider: [name], MD Reason for referral : Care Management   An unsuccessful telephone outreach was attempted today. Jerry [name] is engaged with the care management team. I was unable to leave a voice message today due to the voice mailbox being full.    Follow Up Plan: A member of the  care management team will reach out to Jerry [name] again this week.    Cristy Friedlander [facility] Care Management [facility] [phone]

## 2020-04-15 ENCOUNTER — Telehealth: Payer: Self-pay | Admitting: Family Medicine

## 2020-04-15 NOTE — Telephone Encounter (Signed)
Copied from [place] [phone]. Topic: Medicare AWV >> Apr 15, 2020  1:02 PM Cher Nakai R wrote: Reason for CRM: Left message for patient to call back and schedule Medicare Annual Wellness Visit (AWV) in office or virtually.  No hx of AWV eligible as of 10/04/2019   Please schedule at anytime with [place].      40 Minutes appointment   Any questions, please call me at [phone]

## 2020-04-16 ENCOUNTER — Telehealth: Payer: Self-pay

## 2020-04-16 NOTE — Telephone Encounter (Signed)
    Care Management   Outreach Note  04/16/2020 Name: Jerry [name] MRN: [id number] DOB: [birth year]  Primary Care Provider: [name], MD Reason for referral : Care Management    An unsuccessful telephone outreach was attempted today. [name] is currently engaged with the care management team.      Follow Up Plan:  A HIPAA compliant voice message was left today.  Pending return call.     Cristy Friedlander [facility] Care Management [facility] [phone]

## 2020-04-22 ENCOUNTER — Ambulatory Visit: Payer: Self-pay

## 2020-04-22 NOTE — Telephone Encounter (Signed)
Pt c/o vertigo and dizziness. Onset 2 weeks ago. Pt stated episodes occur daily, mostly in the evening or at night. Pt stated he has vertigo while in bed. He stated he has mild to moderate headache. Pt stated that he is not drinking enough fluids, and his urine is orange colored. Wife agreed that pt is not drinking  Enough fluids. Denies increase in weakness or ALS sx. Pt c/o like his stomach feels "empty" during the episodes but denies nausea. Pt stated he has a h/o constipation and may have 1 BM in a week. Pt stated the spinning may alternate fast to slow. Or the spinning will feel slow. Pt stated he closes his eyes and tries to relax during the episodes. Advised pt to increase fluids and eat more fruits and vegetables and water to help Miralax work effectively.  Care advice given to pt and wife and they both verbalized understanding. Office has closed and did not schedule pt appt. Advised pt that someone from office will call him to advise whether and in office or virtual appt would be best appropriate.             Reason for Disposition . [1] MODERATE dizziness (e.g., vertigo; feels very unsteady, interferes with normal activities) AND [2] has NOT been evaluated by physician for this  Answer Assessment - Initial Assessment Questions 1. DESCRIPTION: "Describe your dizziness."     Dizziness and lightheadedness and nausea- has ad a slight headache during episodes rated 2-5/10 for the past few days 2. VERTIGO: "Do you feel like either you or the room is spinning or tilting?"      Room is spinning 3. LIGHTHEADED: "Do you feel lightheaded?" (e.g., somewhat faint, woozy, weak upon standing)   occasionally 4. SEVERITY: "How bad is it?"  "Can you walk?"   - MILD: Feels unsteady but walking normally.   - MODERATE: Feels very unsteady when walking, but not falling; interferes with normal activities (e.g., school, work) .   - SEVERE: Unable to walk without falling, or requires assistance to  walk without falling.    Chair confined (Has ALS dx 01/2019) 5. ONSET:  "When did the dizziness begin?"     2 weeks ago 6. AGGRAVATING FACTORS: "Does anything make it worse?" (e.g., standing, change in head position)     Change in head position, or side to side 7. CAUSE: "What do you think is causing the dizziness?"   Maybe dehydration - 1 cup a tea and sips of lemonade 8. RECURRENT SYMPTOM: "Have you had dizziness before?" If Yes, ask: "When was the last time?" "What happened that time?"     Yes- several months ago- May 2021 was laying in bed and felt 'Off" 9. OTHER SYMPTOMS: "Do you have any other symptoms?" (e.g., headache, weakness, numbness, vomiting, earache)    Slight headache- front, sharp pain left side  10. PREGNANCY: "Is there any chance you are pregnant?" "When was your last menstrual period?"      n/a  Protocols used: DIZZINESS - VERTIGO-A-AH

## 2020-04-23 NOTE — Telephone Encounter (Signed)
Tried to contact pt but his mailbox was full. Was going to schedule him an appointment with Dr Ancil Boozer

## 2020-04-24 ENCOUNTER — Telehealth: Payer: Self-pay | Admitting: Family Medicine

## 2020-04-24 NOTE — Telephone Encounter (Signed)
Copied from [place] [phone]. Topic: General - Inquiry >> Apr 24, 2020  4:34 PM Greggory Keen D wrote: Pt's wife called wanting to know if Dr. Ancil Boozer could write a note to excuse her husband for jury duty.  He just received a summons in the mail.  CB#  [phone]

## 2020-04-30 ENCOUNTER — Telehealth: Payer: Self-pay

## 2020-04-30 NOTE — Telephone Encounter (Signed)
Reason for CRM: Jerry [name], Jerry [name] (spouse) requesting a Dr. Note excusing patient  from 05/05/2020 jury duty due to patient medical condition (please note medical condition in note). Spouse would like to pick up letter tommorow at her appointment.

## 2020-04-30 NOTE — Telephone Encounter (Signed)
Copied from [place] [id number]. Topic: General - Other >> Apr 30, 2020  8:27 AM Oneta Rack wrote: Reason for CRM:  spouse requesting a Dr. Note excusing him from 05/05/2020 jury duty due to patient medical condition. Spouse would like to pick up letter tommorow at her appointment.

## 2020-05-01 ENCOUNTER — Encounter: Payer: Self-pay | Admitting: Family Medicine

## 2020-05-01 NOTE — Telephone Encounter (Signed)
Pts wife came into the office for her appt today and she did not have the form with her.She will upload the form into her husbands MyChart and is asking once filled out to have it uploaded back into his Mychart along with the jury letter

## 2020-05-02 ENCOUNTER — Encounter: Payer: Self-pay | Admitting: Family Medicine

## 2020-05-06 ENCOUNTER — Encounter: Payer: Self-pay | Admitting: Family Medicine

## 2020-05-06 ENCOUNTER — Telehealth: Payer: Self-pay | Admitting: Family Medicine

## 2020-05-06 NOTE — Telephone Encounter (Signed)
Copied from [place] [phone]. Topic: General - Other >> May 06, 2020  4:27 PM Oneta Rack wrote: Osvaldo Human name: Erlene Quan  Relation to pt: OT from [facility]  Call back number: [phone]   Reason for call:  Requesting PT , OT, Home Health and speech therapy eval orders

## 2020-05-09 ENCOUNTER — Telehealth: Payer: Self-pay | Admitting: Family Medicine

## 2020-05-09 NOTE — Telephone Encounter (Signed)
Home Health Verbal Orders - Caller/Agency: [facility]/ [facility] Callback Number: [phone] EBRAXE  [id number] OT/PT/Skilled Nursing/Social Work/Speech Therapy: Skilled Nursing  Frequency: 1x for 9wks

## 2020-05-13 ENCOUNTER — Telehealth: Payer: Self-pay | Admitting: Family Medicine

## 2020-05-13 NOTE — Telephone Encounter (Signed)
Home Health Verbal Orders - Caller/Agency: [place], calling from [place] Number: [phone] Requesting OT/PT/Skilled Nursing/Social Work/Speech Therapy: PT Frequency:  2w2 1w6

## 2020-05-16 ENCOUNTER — Telehealth: Payer: Self-pay

## 2020-05-16 ENCOUNTER — Telehealth: Payer: Self-pay | Admitting: Family Medicine

## 2020-05-16 NOTE — Telephone Encounter (Signed)
Jerry [name] calling from [place] is calling for orders Social work to assist with natigating with community resources. With a frequency of 1 week 1.  Wife is also requesting an order for benadryl for itching in his face. Patient is taking Radicava for IV infusion for ALS. One of the side effects is itching. No rash at all. Please advise CB- [phone] Ok to leave verbal on VM.

## 2020-05-16 NOTE — Telephone Encounter (Signed)
Copied from [place] [phone]. Topic: General - Other >> May 16, 2020  2:03 PM Oneta Rack wrote: Osvaldo Human name: Langley Gauss  Relation to pt: Medical Service coordinator from [facility]  Call back number:[phone] option 2   Reason for call:  Requesting palliative care verbal orders

## 2020-05-22 ENCOUNTER — Telehealth: Payer: Self-pay

## 2020-05-22 NOTE — Telephone Encounter (Signed)
Spoke with patient and have scheduled an In-person Consult for 06/04/20 @  8:30 AM.  COVID screening was negative. One dog in the home wife will put away before NP arrives. Patient lives in the home with wife Fredda Hammed & 3 children.   Consent obtained; updated Outlook/Netsmart/Team List and Epic.

## 2020-06-02 ENCOUNTER — Telehealth: Payer: Self-pay

## 2020-06-02 NOTE — Telephone Encounter (Signed)
Spoke with Lorriane Shire with [facility] per Dr. Ancil Boozer to give verbal orders for speech therapy for patient.

## 2020-06-02 NOTE — Telephone Encounter (Signed)
Copied from [place] [phone]. Topic: General - Other >> Jun 02, 2020 12:46 PM Rainey Pines A wrote: Lorriane Shire from [facility] would like a callback at [phone] in regards to verbal orders she requested 2 weeks ago for speech therapy. Please advise

## 2020-06-04 ENCOUNTER — Other Ambulatory Visit: Payer: Self-pay

## 2020-06-04 ENCOUNTER — Other Ambulatory Visit: Payer: Medicare Other | Admitting: Internal Medicine

## 2020-06-04 DIAGNOSIS — U071 COVID-19: Secondary | ICD-10-CM | POA: Insufficient documentation

## 2020-06-04 HISTORY — DX: COVID-19: U07.1

## 2020-06-06 NOTE — Progress Notes (Signed)
Name: Jerry [name]   MRN: [id number]    DOB: 1970/11/03   Date:06/09/2020       Progress Note  Subjective  Chief Complaint  Chief Complaint  Patient presents with  . Follow-up    [place] has been evaluating him for PT, OT and speech therapy in regards to his ALS dx. Needs assessment done by PCP.    I connected with  [name] on 06/09/20 at  9:20 AM EST by telephone and verified that I am speaking with the correct person using two identifiers.  I discussed the limitations, risks, security and privacy concerns of performing an evaluation and management service by telephone and the availability of in person appointments. The patient expressed understanding and agreed to proceed. Staff also discussed with the patient that there may be a patient responsible charge related to this service. Patient Location: Home Provider Location: [facility] Additional Individuals present: none  HPI  Patient is a 49 year old male patient of Dr. Ancil [name] Last visit with her was in January 2021. As per that last note, patient with a history of ALS  1. ALS (amyotrophic lateral sclerosis) ([place]) He has follow up with neurologist next month He is having PT, OT and nurse will start coming to his house daily to assist him   Dr. Ancil [name] has been giving verbal orders in the recent past. He has been followed by CCM, and had a palliative care appointment scheduled 06/04/2020  His last visit at [facility] was with Delsa Grana for an elevated blood pressure concern, although his blood pressure was good on that visit.  Follows up with a phone visit today noting that home health has been evaluating for OT, PT, and speech therapy, with an assessment by his PCP needed. This is my first visit with him, and it is a phone visit.  His PCP is Dr. Ancil [name]. Significant limitations with this being a phone visit as well as my first meeting with them today. Jerry [name] his wife answered phone, and was very helpful as a  historian, and I did talk to the patient today as well by phone.  He has been getting PT/OT at his home. Patient and his wife both note PT/OT has been helpful. They have taught wife how to help with activities as well including range of motion exercises, massage therapy.Marland Kitchen  He needs full assistance presently In last 3 months, legs have become more weak and has wheelchair to help get transported throughout the house.  Swallowing is pretty good per wife, speech getting slower and at times more slurred. Tongue heavier and speech therapy came last week and still helping.  Sees neurology thru [place] [facility] at [facility] and has f/u with them in January and will keep following.   Had Covid vaccine, did recommend a booster as well.  I did asked the wife and the patient if they had any specific concerns or questions today, and they did not.  Patient Active Problem List   Diagnosis Date Noted  . ALS (amyotrophic lateral sclerosis) ([place]) 01/11/2019  . Fasciculations of muscle 09/08/2018  . Weakness 09/08/2018  . Thenar muscle atrophy of left hand 09/08/2018  . Prediabetes 11/05/2016  . Family history of diabetes mellitus 11/04/2016    Past Surgical History:  Procedure Laterality Date  . MASS EXCISION Right 11/26/2014   Procedure: EXCISION MASS/GROIN EXCISION MASS;  Surgeon: Robert Bellow, MD;  Location: [facility] ORS;  Service: General;  Laterality: Right;  . MASS EXCISION Right 11/26/2014  Procedure: MINOR EXCISION OF MASS/POST THIGH MASS;  Surgeon: Robert Bellow, MD;  Location: [facility] ORS;  Service: General;  Laterality: Right;  . PORTACATH PLACEMENT Right 06/19/2019    Family History  Problem Relation Age of Onset  . Diabetes Father   . Diabetes Mother   . Diabetes Sister   . Hypertension Sister   . Diabetes Brother   . Diabetes Sister   . Fibromyalgia Sister   . Diabetes Sister     Social History   Tobacco Use  . Smoking status: Never Smoker  . Smokeless tobacco: Never Used   Substance Use Topics  . Alcohol use: No    Alcohol/week: 0.0 standard drinks     Current Outpatient Medications:  .  RADICAVA 30 MG/100ML SOLN IV infusion, Inject into the vein., Disp: , Rfl:  .  alteplase (CATHFLO ACTIVASE) 2 MG injection, 2 mg by Dwell route as needed (FOR OCCULDED CATHETER). PER PROTOCOL. Repeated as indicated for occluded catheter, Disp: , Rfl:  .  Edaravone (RADICAVA IV), Inject 60 mg into the vein. (Patient not taking: Reported on 03/21/2020), Disp: , Rfl:  .  EPINEPHrine (EPIPEN 2-PAK) 0.3 mg/0.3 mL IJ SOAJ injection, Inject 0.3 mLs (0.3 mg total) into the muscle as needed for anaphylaxis., Disp: 1 each, Rfl: 0 .  loratadine (CLARITIN) 10 MG tablet, Take 1 tablet (10 mg total) by mouth daily., Disp: 30 tablet, Rfl: 11 .  NON FORMULARY, Take 1 capsule by mouth daily. Vitamin D3 125 mcg and K2 50 mcg, Disp: , Rfl:  .  Vitamin D, Ergocalciferol, (DRISDOL) 1.25 MG (50000 UNIT) CAPS capsule, TAKE 1 CAPSULE BY MOUTH EVERY 7 DAYS (Patient not taking: Reported on 03/21/2020), Disp: 12 capsule, Rfl: 1  Allergies  Allergen Reactions  . Crab [Shellfish Allergy] Rash  . Other Rash    With staff assistance, above reviewed with the patient/caregiver today.  ROS: As per HPI, otherwise no specific complaints on a limited and focused system review   Objective  Virtual encounter, vitals not obtained.  There is no height or weight on file to calculate BMI.  Physical Exam   Appears in NAD via conversation, patient's voice was soft, could understand his responses which were short and somewhat slow to respond. Breathing: No obvious respiratory distress.  Was able to respond to some simple questions asked Neurological: Pt is alert Psychiatric: Hard to assess patient's mood and affect,    No results found for this or any previous visit (from the past 72 hour(s)).  PHQ2/9: Depression screen [facility] 2/9 06/09/2020 03/21/2020 01/10/2020 07/19/2019 06/27/2019  Decreased Interest 0 0 0 0  0  Down, Depressed, Hopeless 0 0 0 0 0  PHQ - 2 Score 0 0 0 0 0  Altered sleeping - - 0 0 -  Tired, decreased energy - - 0 0 -  Change in appetite - - 0 0 -  Feeling bad or failure about yourself  - - 0 0 -  Trouble concentrating - - 0 0 -  Moving slowly or fidgety/restless - - 0 0 -  Suicidal thoughts - - 0 0 -  PHQ-9 Score - - 0 0 -  Difficult doing work/chores - - - Not difficult at all -   PHQ-2/9 Result reviewed  Fall Risk: Fall Risk  06/09/2020 03/21/2020 01/10/2020 07/19/2019 06/27/2019  Falls in the past year? 0 1 1 1 1   Number falls in past yr: 0 1 0 1 -  Comment - - - - -  Injury with Fall? 0 0 0 0 0  Comment - - - - -  Risk for fall due to : - Impaired mobility;History of fall(s) - Other (Comment);Impaired balance/gait;Impaired mobility;Medication side effect;History of fall(s) Impaired balance/gait;Medication side effect;Impaired mobility;History of fall(s);Other (Comment)  Risk for fall due to: Comment - - - ALS Dx with ALS  Follow up - - - - Falls prevention discussed     Assessment & Plan  1. ALS (amyotrophic lateral sclerosis) ([place]) 2. Muscle wasting and atrophy,  3. Other speech disturbance Patient with a known history of ALS, followed at the [place] clinic at [facility] presently. He has been receiving home OT/PT and speech therapy, and both him and the wife do think it has been helpful. I do think this needs to continue, and noted to the wife today that if any paperwork needs to be completed through home health to help ensure this continues, to let us know and we can complete.  Also will ask [place] to ensure they have a verbal order to continue the services if that is needed. Emphasized the importance of continuing to follow-up with the ALS clinic, and also will schedule follow-up with the PCP, Dr. Ancil [name] in approximately 6 to 8 weeks, as do feel that will be helpful as well to follow over time.  (As a noted I will be leaving this practice in the very near future and having  continued follow-up with the PCP felt important)  Can follow-up sooner as needed also noted today.  I discussed the assessment and treatment plan with the patient and his wife. The patient was provided an opportunity to ask questions and all were answered. The patient agreed with the plan and demonstrated an understanding of the instructions.  I provided 20 minutes of non-face-to-face time during this encounter that included discussing at length patient's sx/history, pertinent pmhx, medications, treatment and follow up plan. This time also included the necessary documentation, orders, and chart review.  Towanda Malkin, MD

## 2020-06-09 ENCOUNTER — Encounter: Payer: Self-pay | Admitting: Internal Medicine

## 2020-06-09 ENCOUNTER — Telehealth (INDEPENDENT_AMBULATORY_CARE_PROVIDER_SITE_OTHER): Payer: Medicare Other | Admitting: Internal Medicine

## 2020-06-09 DIAGNOSIS — M6250 Muscle wasting and atrophy, not elsewhere classified, unspecified site: Secondary | ICD-10-CM

## 2020-06-09 DIAGNOSIS — R4789 Other speech disturbances: Secondary | ICD-10-CM

## 2020-06-09 DIAGNOSIS — G1221 Amyotrophic lateral sclerosis: Secondary | ICD-10-CM | POA: Diagnosis not present

## 2020-06-11 ENCOUNTER — Telehealth: Payer: Self-pay

## 2020-06-11 NOTE — Telephone Encounter (Signed)
Copied from [place] [id number]. Topic: General - Other >> Jun 11, 2020  9:57 AM Rainey Pines A wrote: Patient is being discharged due to medicare not receiving documentation with appropriate  timeframe. Patient will be readmitted and his care will not be interrupted. [facility] needs verbal orders approving patient being Readmitted and is also requesting mondays visit notes faxed over to [phone]. [facility] needs info faxed over as soon as possible. Best contact [phone]

## 2020-06-16 ENCOUNTER — Ambulatory Visit: Payer: Self-pay

## 2020-06-16 NOTE — Telephone Encounter (Addendum)
Wife called - pt with runny nose, cough occasionally phlegm, + covid- h/o ALS Covid + by home test. Congestion, fever intermittently yesterday 100.5- sx started Friday night. Pulse oximeter HR 110 and SaO2 94-96 %. Phlegm is clear to white.  Pt recieves infusion for ALS. Pt's wife wants to know what antipyretic she may administer that will not interact with med infusion.Wife stated she did not give  infusion past 2 days due to pt  Fever. Pt's neurologist wants pt to receive monoclonal infusion' Advised wife to call neurologist with advice about medication to give.  Care advice given and wife verbalized understanding. Called monoclonal infusion number and LM on VM.   Reason for Disposition  [1] COVID-19 diagnosed by positive lab test AND [2] mild symptoms (e.g., cough, fever, others) AND [1] no complications or SOB  Answer Assessment - Initial Assessment Questions 1. COVID-19 DIAGNOSIS: "Who made your Coronavirus (COVID-19) diagnosis?" "Was it confirmed by a positive lab test?" If not diagnosed by a HCP, ask "Are there lots of cases (community spread) where you live?" (See public health department website, if unsure)     Home test-  2. COVID-19 EXPOSURE: "Was there any known exposure to COVID before the symptoms began?" CDC Definition of close contact: within 6 feet (2 meters) for a total of 15 minutes or more over a 24-hour period.     unknown 3. ONSET: "When did the COVID-19 symptoms start?"      Friday 4. WORST SYMPTOM: "What is your worst symptom?" (e.g., cough, fever, shortness of breath, muscle aches)    Fever and cough 5. COUGH: "Do you have a cough?" If Yes, ask: "How bad is the cough?"       Yes- mild cough occasion 6. FEVER: "Do you have a fever?" If Yes, ask: "What is your temperature, how was it measured, and when did it start?"     Not today - pt fever to 100.5  7. RESPIRATORY STATUS: "Describe your breathing?" (e.g., shortness of breath, wheezing, unable to speak)      Normal at  baseline 8. BETTER-SAME-WORSE: "Are you getting better, staying the same or getting worse compared to yesterday?"  If getting worse, ask, "In what way?"     better 9. HIGH RISK DISEASE: "Do you have any chronic medical problems?" (e.g., asthma, heart or lung disease, weak immune system, obesity, etc.)     ALS-   11. OTHER SYMPTOMS: "Do you have any other symptoms?"  (e.g., chills, fatigue, headache, loss of smell or taste, muscle pain, sore throat; new loss of smell or taste especially support the diagnosis of COVID-19)      Watery eyes and runny nose color is clear  Protocols used: CORONAVIRUS (COVID-19) DIAGNOSED OR SUSPECTED-A-AH

## 2020-06-17 ENCOUNTER — Encounter: Payer: Self-pay | Admitting: Nurse Practitioner

## 2020-06-17 ENCOUNTER — Telehealth: Payer: Self-pay

## 2020-06-17 ENCOUNTER — Other Ambulatory Visit: Payer: Self-pay | Admitting: Nurse Practitioner

## 2020-06-17 DIAGNOSIS — U071 COVID-19: Secondary | ICD-10-CM

## 2020-06-17 DIAGNOSIS — G1221 Amyotrophic lateral sclerosis: Secondary | ICD-10-CM

## 2020-06-17 NOTE — Progress Notes (Signed)
Name: Jerry [name]   MRN: [id number]    DOB: 1970-11-12   Date:06/18/2020       Progress Note  Subjective  Chief Complaint  Covid-19 Positive  I connected with  [name]  on 06/18/20 at  1:00 PM EST by a video enabled telemedicine application and verified that I am speaking with the correct person using two identifiers.  I discussed the limitations of evaluation and management by telemedicine and the availability of in person appointments. The patient expressed understanding and agreed to proceed with the virtual visit  Staff also discussed with the patient that there may be a patient responsible charge related to this service. Patient Location: inside his car - passenger seat, car parked  Provider Location: [facility] Additional Individuals present: wife   HPI  COVID-19: patient was exposed by wife and daughter that tested positive on Dec  9th, 2021, he developed a cough, fever, chills, mild fatigue and headache. He states pulse osx at home has been 94-97 %. He contacted [place] on Monday and we referred him for monoclonal antibody infusion. He had the infusion this afternoon. He states at one point his cough was productive with white/clear phlegm but is mostly dry now. He is feeling better, but still has a frequent cough and mild headache. Family members also feeling better.  He has ALS and is high risk for complications. Advised to keep monitoring pulse ox. We will start tessalon and mucinex, discussed tapotage with wife . Explained need to contact me back if symptoms gets worse, sputum gets thicker or change in colors or develops sob  He completed two doses of Pfizer on 10/25/2019    Patient Active Problem List   Diagnosis Date Noted   Morbid obesity ([place])    COVID-19 virus infection 06/2020   ALS (amyotrophic lateral sclerosis) ([place]) 01/11/2019   Fasciculations of muscle 09/08/2018   Weakness 09/08/2018   Thenar muscle atrophy of left hand 09/08/2018   Prediabetes 11/05/2016    Family history of diabetes mellitus 11/04/2016    Past Surgical History:  Procedure Laterality Date   MASS EXCISION Right 11/26/2014   Procedure: EXCISION MASS/GROIN EXCISION MASS;  Surgeon: Robert Bellow, MD;  Location: [facility] ORS;  Service: General;  Laterality: Right;   MASS EXCISION Right 11/26/2014   Procedure: MINOR EXCISION OF MASS/POST THIGH MASS;  Surgeon: Robert Bellow, MD;  Location: [facility] ORS;  Service: General;  Laterality: Right;   PORTACATH PLACEMENT Right 06/19/2019    Family History  Problem Relation Age of Onset   Diabetes Father    Diabetes Mother    Diabetes Sister    Hypertension Sister    Diabetes Brother    Diabetes Sister    Fibromyalgia Sister    Diabetes Sister     Social History   Socioeconomic History   Marital status: Married    Spouse name: [name]   Number of children: 3   Years of education: Not on file   Highest education level: High school graduate  Occupational History   Occupation: Mining engineer    Comment: self employed   Occupation: Ship broker    Comment: [employer]  Tobacco Use   Smoking status: Never Smoker   Smokeless tobacco: Never Used  Scientific laboratory technician Use: Never used  Substance and Sexual Activity   Alcohol use: No    Alcohol/week: 0.0 standard drinks   Drug use: No   Sexual activity: Yes    Partners: Female  Other Topics Concern  Not on file  Social History Narrative   Lives with wife and 3 children   Approved for disability 02/2019 for ALS    Social Determinants of Health   Financial Resource Strain: Medium Risk   Difficulty of Paying Living Expenses: Somewhat hard  Food Insecurity: No Food Insecurity   Worried About Charity fundraiser in the Last Year: Never true   Ran Out of Food in the Last Year: Never true  Transportation Needs: No Transportation Needs   Lack of Transportation (Medical): No   Lack of Transportation (Non-Medical): No  Physical Activity: Not on file   Stress: No Stress Concern Present   Feeling of Stress : Not at all  Social Connections: Socially Integrated   Frequency of Communication with Friends and Family: More than three times a week   Frequency of Social Gatherings with Friends and Family: More than three times a week   Attends Religious Services: More than 4 times per year   Active Member of [employer] or Organizations: Yes   Attends Music therapist: More than 4 times per year   Marital Status: Married  Human resources officer Violence: Not At Risk   Fear of Current or Ex-Partner: No   Emotionally Abused: No   Physically Abused: No   Sexually Abused: No     Current Outpatient Medications:    alteplase (CATHFLO ACTIVASE) 2 MG injection, 2 mg by Dwell route as needed (FOR OCCULDED CATHETER). PER PROTOCOL. Repeated as indicated for occluded catheter, Disp: , Rfl:    budesonide (PULMICORT) 0.5 MG/2ML nebulizer solution, Take by nebulization., Disp: , Rfl:    edaravone (RADICAVA) 30 MG/100ML SOLN IV infusion, Inject into the vein., Disp: , Rfl:    EPINEPHrine (EPIPEN 2-PAK) 0.3 mg/0.3 mL IJ SOAJ injection, Inject 0.3 mLs (0.3 mg total) into the muscle as needed for anaphylaxis., Disp: 1 each, Rfl: 0   fenofibrate (TRICOR) 145 MG tablet, Take 145 mg by mouth daily., Disp: , Rfl:    loratadine (CLARITIN) 10 MG tablet, Take 1 tablet (10 mg total) by mouth daily., Disp: 30 tablet, Rfl: 11   montelukast (SINGULAIR) 5 MG chewable tablet, Chew 10 mg by mouth daily., Disp: , Rfl:    NON FORMULARY, Take 1 capsule by mouth daily. Vitamin D3 125 mcg and K2 50 mcg, Disp: , Rfl:    RADICAVA 30 MG/100ML SOLN IV infusion, Inject into the vein., Disp: , Rfl:    Edaravone (RADICAVA IV), Inject 60 mg into the vein. (Patient not taking: No sig reported), Disp: , Rfl:    Vitamin D, Ergocalciferol, (DRISDOL) 1.25 MG (50000 UNIT) CAPS capsule, TAKE 1 CAPSULE BY MOUTH EVERY 7 DAYS (Patient not taking: No sig reported), Disp: 12  capsule, Rfl: 1 No current facility-administered medications for this visit.  Facility-Administered Medications Ordered in Other Visits:    0.9 %  sodium chloride infusion, , Intravenous, PRN, Theora Gianotti, NP   albuterol (VENTOLIN HFA) 108 (90 Base) MCG/ACT inhaler 2 puff, 2 puff, Inhalation, Once PRN, Theora Gianotti, NP   diphenhydrAMINE (BENADRYL) injection 50 mg, 50 mg, Intravenous, Once PRN, Theora Gianotti, NP   EPINEPHrine (EPI-PEN) injection 0.3 mg, 0.3 mg, Intramuscular, Once PRN, Theora Gianotti, NP   famotidine (PEPCID) IVPB 20 mg premix, 20 mg, Intravenous, Once PRN, Theora Gianotti, NP   methylPREDNISolone sodium succinate (SOLU-MEDROL) 125 mg/2 mL injection 125 mg, 125 mg, Intravenous, Once PRN, Theora Gianotti, NP  Allergies  Allergen Reactions   Otho Darner  Allergy] Rash   Other Rash    I personally reviewed active problem list, medication list, allergies, family history, social history, health maintenance, notes from last encounter with the patient/caregiver today.   ROS  Ten systems reviewed and is negative except as mentioned in HPI   Objective  Virtual encounter, vitals not obtained.  Body mass index is 30.12 kg/m.  Physical Exam  Awake, alert and oriented, coughing during the visit   PHQ2/9: Depression screen [facility] 2/9 06/18/2020 06/09/2020 03/21/2020 01/10/2020 07/19/2019  Decreased Interest 0 0 0 0 0  Down, Depressed, Hopeless 0 0 0 0 0  PHQ - 2 Score 0 0 0 0 0  Altered sleeping - - - 0 0  Tired, decreased energy - - - 0 0  Change in appetite - - - 0 0  Feeling bad or failure about yourself  - - - 0 0  Trouble concentrating - - - 0 0  Moving slowly or fidgety/restless - - - 0 0  Suicidal thoughts - - - 0 0  PHQ-9 Score - - - 0 0  Difficult doing work/chores - - - - Not difficult at all   PHQ-2/9 Result is negative.    Fall Risk: Fall Risk  06/18/2020 06/09/2020 03/21/2020  01/10/2020 07/19/2019  Falls in the past year? 0 0 1 1 1   Number falls in past yr: 0 0 1 0 1  Comment - - - - -  Injury with Fall? 0 0 0 0 0  Comment - - - - -  Risk for fall due to : Impaired mobility - Impaired mobility;History of fall(s) - Other (Comment);Impaired balance/gait;Impaired mobility;Medication side effect;History of fall(s)  Risk for fall due to: Comment - - - - ALS  Follow up - - - - -     Assessment & Plan  1. COVID-19  - benzonatate (TESSALON) 100 MG capsule; Take 1-2 capsules (100-200 mg total) by mouth 2 (two) times daily as needed.  Dispense: 60 capsule; Refill: 0 - guaiFENesin (MUCINEX) 600 MG 12 hr tablet; Take 1 tablet (600 mg total) by mouth 2 (two) times daily.  Dispense: 60 tablet; Refill: 0  2. ALS (amyotrophic lateral sclerosis) ([place])   I discussed the assessment and treatment plan with the patient. The patient was provided an opportunity to ask questions and all were answered. The patient agreed with the plan and demonstrated an understanding of the instructions.  The patient was advised to call back or seek an in-person evaluation if the symptoms worsen or if the condition fails to improve as anticipated.  I provided 15  minutes of non-face-to-face time during this encounter.

## 2020-06-17 NOTE — Progress Notes (Signed)
I connected by phone with Jerry [name] on 06/17/2020 at 3:17 PM to discuss the potential use of a new treatment for mild to moderate COVID-19 viral infection in non-hospitalized patients.  This patient is a 49 y.o. male that meets the FDA criteria for Emergency Use Authorization of COVID monoclonal antibody casirivimab/imdevimab, bamlanivimab/etesevimab, or sotrovimab.  Has a (+) direct SARS-CoV-2 viral test result  Has mild or moderate COVID-19   Is NOT hospitalized due to COVID-19  Is within 10 days of symptom onset  Has at least one of the high risk factor(s) for progression to severe COVID-19 and/or hospitalization as defined in EUA.  Specific high risk criteria : BMI > 25, Neurodevelopmental disorder and Medical-related technological dependence   I have spoken and communicated the following to the patient or parent/caregiver regarding COVID monoclonal antibody treatment:  1. FDA has authorized the emergency use for the treatment of mild to moderate COVID-19 in adults and pediatric patients with positive results of direct SARS-CoV-2 viral testing who are 46 years of age and older weighing at least 40 kg, and who are at high risk for progressing to severe COVID-19 and/or hospitalization.  2. The significant known and potential risks and benefits of COVID monoclonal antibody, and the extent to which such potential risks and benefits are unknown.  3. Information on available alternative treatments and the risks and benefits of those alternatives, including clinical trials.  4. Patients treated with COVID monoclonal antibody should continue to self-isolate and use infection control measures (e.g., wear mask, isolate, social distance, avoid sharing personal items, clean and disinfect "high touch" surfaces, and frequent handwashing) according to CDC guidelines.   5. The patient or parent/caregiver has the option to accept or refuse COVID monoclonal antibody treatment.  After reviewing this  information with the patient, the patient has agreed to receive one of the available covid 19 monoclonal antibodies and will be provided an appropriate fact sheet prior to infusion.  Murray Hodgkins, NP 06/17/2020 3:17 PM

## 2020-06-17 NOTE — Telephone Encounter (Signed)
Copied from [place] [id number]. Topic: General - Other >> Jun 17, 2020  9:40 AM Yvette Rack wrote: Reason for CRM: Jerry [name] with [facility] stated pt as well as wife and kids have tested positive for Covid and requested that all services be delayed until 07/01/20. Cb# [phone]

## 2020-06-18 ENCOUNTER — Telehealth (INDEPENDENT_AMBULATORY_CARE_PROVIDER_SITE_OTHER): Payer: Medicare Other | Admitting: Family Medicine

## 2020-06-18 ENCOUNTER — Encounter: Payer: Self-pay | Admitting: Family Medicine

## 2020-06-18 ENCOUNTER — Other Ambulatory Visit: Payer: Self-pay

## 2020-06-18 ENCOUNTER — Ambulatory Visit (HOSPITAL_COMMUNITY)
Admission: RE | Admit: 2020-06-18 | Discharge: 2020-06-18 | Disposition: A | Discharge status: Home / Self Care | Payer: Medicare Other | Source: Ambulatory Visit | Attending: Pulmonary Disease | Admitting: Pulmonary Disease

## 2020-06-18 VITALS — Ht 65.0 in | Wt 181.0 lb

## 2020-06-18 DIAGNOSIS — Z23 Encounter for immunization: Secondary | ICD-10-CM | POA: Diagnosis not present

## 2020-06-18 DIAGNOSIS — U071 COVID-19: Secondary | ICD-10-CM

## 2020-06-18 DIAGNOSIS — G1221 Amyotrophic lateral sclerosis: Secondary | ICD-10-CM | POA: Diagnosis not present

## 2020-06-18 MED ORDER — SODIUM CHLORIDE 0.9 % IV SOLN: Freq: Once | INTRAVENOUS | Status: AC

## 2020-06-18 MED ORDER — EPINEPHRINE 0.3 MG/0.3ML IJ SOAJ: 0.3000 mg | Freq: Once | INTRAMUSCULAR | Status: DC | PRN

## 2020-06-18 MED ORDER — METHYLPREDNISOLONE SODIUM SUCC 125 MG IJ SOLR: 125.0000 mg | Freq: Once | INTRAMUSCULAR | Status: DC | PRN

## 2020-06-18 MED ORDER — HEPARIN SOD (PORK) LOCK FLUSH 100 UNIT/ML IV SOLN
500.0000 [IU] | Freq: Once | INTRAVENOUS | Status: AC
  Administered 2020-06-18: 15:00:00 500 [IU] via INTRAVENOUS

## 2020-06-18 MED ORDER — BENZONATATE 100 MG PO CAPS: 100.0000 mg | ORAL_CAPSULE | Freq: Two times a day (BID) | ORAL | 0 refills | Status: DC | PRN

## 2020-06-18 MED ORDER — FAMOTIDINE IN NACL 20-0.9 MG/50ML-% IV SOLN: 20.0000 mg | Freq: Once | INTRAVENOUS | Status: DC | PRN

## 2020-06-18 MED ORDER — DIPHENHYDRAMINE HCL 50 MG/ML IJ SOLN: 50.0000 mg | Freq: Once | INTRAMUSCULAR | Status: DC | PRN

## 2020-06-18 MED ORDER — SODIUM CHLORIDE 0.9 % IV SOLN: INTRAVENOUS | Status: DC | PRN

## 2020-06-18 MED ORDER — ALBUTEROL SULFATE HFA 108 (90 BASE) MCG/ACT IN AERS: 2.0000 | INHALATION_SPRAY | Freq: Once | RESPIRATORY_TRACT | Status: DC | PRN

## 2020-06-18 MED ORDER — GUAIFENESIN ER 600 MG PO TB12: 600.0000 mg | ORAL_TABLET | Freq: Two times a day (BID) | ORAL | 0 refills | Status: DC

## 2020-06-18 NOTE — Progress Notes (Addendum)
  Diagnosis: COVID-19  Physician: Dr. Joya Gaskins  Procedure: [facility] Infusion [facility] Med: bamlanivimab\etesevimab infusion - Provided patient with bamlanimivab\etesevimab fact sheet for patients, parents and caregivers prior to infusion.  Complications: No immediate complications noted.  Discharge: Discharged home   Ludwig Lean 06/18/2020

## 2020-06-18 NOTE — Discharge Instructions (Signed)
10 Things You Can Do to Manage Your COVID-19 Symptoms at Home If you have possible or confirmed COVID-19: 1. Stay home from work and school. And stay away from other public places. If you must go out, avoid using any kind of public transportation, ridesharing, or taxis. 2. Monitor your symptoms carefully. If your symptoms get worse, call your healthcare provider immediately. 3. Get rest and stay hydrated. 4. If you have a medical appointment, call the healthcare provider ahead of time and tell them that you have or may have COVID-19. 5. For medical emergencies, call 911 and notify the dispatch personnel that you have or may have COVID-19. 6. Cover your cough and sneezes with a tissue or use the inside of your elbow. 7. Wash your hands often with soap and water for at least 20 seconds or clean your hands with an alcohol-based hand sanitizer that contains at least 60% alcohol. 8. As much as possible, stay in a specific room and away from other people in your home. Also, you should use a separate bathroom, if available. If you need to be around other people in or outside of the home, wear a mask. 9. Avoid sharing personal items with other people in your household, like dishes, towels, and bedding. 10. Clean all surfaces that are touched often, like counters, tabletops, and doorknobs. Use household cleaning sprays or wipes according to the label instructions. cdc.gov/coronavirus 01/03/2019 This information is not intended to replace advice given to you by your health care provider. Make sure you discuss any questions you have with your health care provider. Document Revised: 06/07/2019 Document Reviewed: 06/07/2019 Elsevier Patient Education  2020 Elsevier Inc. What types of side effects do monoclonal antibody drugs cause?  Common side effects  In general, the more common side effects caused by monoclonal antibody drugs include: . Allergic reactions, such as hives or itching . Flu-like signs and  symptoms, including chills, fatigue, fever, and muscle aches and pains . Nausea, vomiting . Diarrhea . Skin rashes . Low blood pressure   The CDC is recommending patients who receive monoclonal antibody treatments wait at least 90 days before being vaccinated.  Currently, there are no data on the safety and efficacy of mRNA COVID-19 vaccines in persons who received monoclonal antibodies or convalescent plasma as part of COVID-19 treatment. Based on the estimated half-life of such therapies as well as evidence suggesting that reinfection is uncommon in the 90 days after initial infection, vaccination should be deferred for at least 90 days, as a precautionary measure until additional information becomes available, to avoid interference of the antibody treatment with vaccine-induced immune responses. If you have any questions or concerns after the infusion please call the [facility] Provider on call at [phone]. This number is ONLY intended for your use regarding questions or concerns about the infusion post-treatment side-effects.  Please do not provide this number to others for use. For return to work notes please contact your primary care provider.   If someone you know is interested in receiving treatment please have them call the COVID hotline at [phone].   

## 2020-06-18 NOTE — Progress Notes (Deleted)
Patient reviewed Fact Sheet for Patients, Parents, and Caregivers for Emergency Use Authorization (EUA) of bamlanivimab and etesevimab for the Treatment of Coronavirus. Patient also reviewed and is agreeable to the estimated cost of treatment. Patient is agreeable to proceed.   

## 2020-06-18 NOTE — Progress Notes (Signed)
Patient reviewed Fact Sheet for Patients, Parents, and Caregivers for Emergency Use Authorization (EUA) of bamlanivimab and etesevimab for the Treatment of Coronavirus. Patient also reviewed and is agreeable to the estimated cost of treatment. Patient is agreeable to proceed.   

## 2020-06-19 ENCOUNTER — Ambulatory Visit: Payer: Self-pay

## 2020-06-19 ENCOUNTER — Telehealth: Payer: Self-pay

## 2020-06-19 DIAGNOSIS — G1221 Amyotrophic lateral sclerosis: Secondary | ICD-10-CM

## 2020-06-19 DIAGNOSIS — U071 COVID-19: Secondary | ICD-10-CM

## 2020-06-19 NOTE — Chronic Care Management (AMB) (Signed)
Care Management   Follow Up Note   06/19/2020 Name: Jerry [name] MRN: [id number] DOB: 08/07/1970  Primary Care Provider: [name], MD Reason for referral : Care Management  Jerry [name] is a 49 y.o. year old male who is a primary care patient of [name], MD. He is currently engaged with the care management team.   Review of Jerry [name] status, including review of consultants reports, relevant labs and test results was conducted today. Collaboration with appropriate care team members was performed as part of the comprehensive evaluation and provision of chronic care management services.    SDOH (Social Determinants of Health) assessments performed: No      Outpatient Encounter Medications as of 06/19/2020  Medication Sig  . alteplase (CATHFLO ACTIVASE) 2 MG injection 2 mg by Dwell route as needed (FOR OCCULDED CATHETER). PER PROTOCOL. Repeated as indicated for occluded catheter  . benzonatate (TESSALON) 100 MG capsule Take 1-2 capsules (100-200 mg total) by mouth 2 (two) times daily as needed.  . budesonide (PULMICORT) 0.5 MG/2ML nebulizer solution Take by nebulization. (Patient not taking: Reported on 06/19/2020)  . Edaravone (RADICAVA IV) Inject 60 mg into the vein. (Patient not taking: No sig reported)  . edaravone (RADICAVA) 30 MG/100ML SOLN IV infusion Inject into the vein.  Marland Kitchen EPINEPHrine (EPIPEN 2-PAK) 0.3 mg/0.3 mL IJ SOAJ injection Inject 0.3 mLs (0.3 mg total) into the muscle as needed for anaphylaxis.  . fenofibrate (TRICOR) 145 MG tablet Take 145 mg by mouth daily.  Marland Kitchen guaiFENesin (MUCINEX) 600 MG 12 hr tablet Take 1 tablet (600 mg total) by mouth 2 (two) times daily.  Marland Kitchen loratadine (CLARITIN) 10 MG tablet Take 1 tablet (10 mg total) by mouth daily.  . montelukast (SINGULAIR) 5 MG chewable tablet Chew 10 mg by mouth daily.  . NON FORMULARY Take 1 capsule by mouth daily. Vitamin D3 125 mcg and K2 50 mcg  . RADICAVA 30 MG/100ML SOLN IV infusion Inject into the  vein.  . Vitamin D, Ergocalciferol, (DRISDOL) 1.25 MG (50000 UNIT) CAPS capsule TAKE 1 CAPSULE BY MOUTH EVERY 7 DAYS (Patient not taking: No sig reported)   No facility-administered encounter medications on file as of 06/19/2020.     Goals Addressed            This Visit's Progress   . Covid       CARE PLAN ENTRY (see longtitudinal plan of care for additional care plan information)  Current Barriers:  . Care Management needs related to COVID-19.  Clinical Goal(s):  Over the next 14 days, patient: . Will not require hospitalization or emergent care d/t complications r/t [id number]. . Will quarantine as advised and utilize measures to prevent further exposure. . Will notify provider if symptoms worsen.   Interventions: . Provided education to enhance basic understanding of COVID-19 as a viral disease, treatment recommendations, measures to prevent further exposure, recommended vaccine schedule, and worsening s/sx that require immediate medical attention.  . Reviewed medications and indications for use.    Patient Self Care Activities:  . Patient will continue to take medications as prescribed . Patient will call provider office for new concerns or questions  Initial goal documentation                   . COMPLETED: Obtain Long Term In-Home Services       Current Barriers:  . Chronic Disease Management support related to Amyotrophic lateral sclerosis (ALS).  Case Manager Clinical Goal(s):  Marland Kitchen Over the next  90 days, patient will not be hospitalized for complications related to chronic illnesses.-Complete . Over the next 90 days, patient will take all medications as prescribed.-Complete . Over the next 90 days, patient will attend all medical appointments as scheduled.-Complete . Over the next 90 days, patient will follow recommended safety precautions to prevent falls and injuries.-Complete . Over the next 60 days, patient will work with Chronic Care Management  team to address plan for home health and long term care services.-Complete   Interventions:  .  Discussed current personal care needs and plan for long term assistance. Approved for services via [place]. He has also completed the initial assessment with the [place] team.  Services are temporarily on hold d/t his recent Covid dx. Will resume services in January.    Patient Self Care Activities:  . Attends scheduled provider appointments . Calls pharmacy for medication refills . Calls provider office for new concerns or questions . Unable to perform ADLs independently . Unable to perform IADLs independently  Please see past updates related to this goal by clicking on the "Past Updates" button in the selected goal         PLAN A member of the care management team will follow-up with Jerry [name] within the next 30 days.     [facility] Care Management [facility] [phone]

## 2020-06-19 NOTE — Telephone Encounter (Signed)
Error. Please disregard

## 2020-07-02 ENCOUNTER — Telehealth: Payer: Self-pay

## 2020-07-02 NOTE — Telephone Encounter (Signed)
Copied from CRM [id number]. Topic: General - Other >> Jul 01, 2020  3:46 PM Marylen Ponto wrote: Reason for CRM: Jerry [name] with [facility] stated patient requested that the PT appt for today be rescheduled for tomorrow. Cb# [phone]

## 2020-07-03 ENCOUNTER — Telehealth: Payer: Self-pay | Admitting: Family Medicine

## 2020-07-03 NOTE — Telephone Encounter (Signed)
[facility] / Jerelyn started pt care with pt today and is requesting PT 1 time a wk for 9 wk. She also is giving heads up re a [employer] and [employer] request coming thru. CB # [phone]

## 2020-07-07 ENCOUNTER — Telehealth: Payer: Self-pay | Admitting: Family Medicine

## 2020-07-07 NOTE — Telephone Encounter (Signed)
Copied from CRM [id number]. Topic: Medicare AWV >> Jul 07, 2020  2:18 PM Claudette Laws R wrote: Reason for CRM:  Left message for patient to call back and schedule Medicare Annual Wellness Visit (AWV) in office.   If not able to come in office, please offer to do virtually.   No hx of AWV eligible as of 10/04/2019 awvi  Please schedule at anytime with [facility] Advisor.      40 Minutes appointment   Any questions, please call me at [phone]

## 2020-07-08 ENCOUNTER — Telehealth: Payer: Self-pay | Admitting: Family Medicine

## 2020-07-08 NOTE — Telephone Encounter (Signed)
Copied from CRM [id number]. Topic: Quick Communication - Home Health Verbal Orders >> Jul 08, 2020  3:06 PM Gaetana Michaelis A wrote: Caller/Agency: Marcelino Duster - [facility] Callback Number: [phone] Requesting Skilled Nursing Frequency: 1x a week for 9 weeks   Secured voicemail if a message is needed to be left

## 2020-07-08 NOTE — Patient Instructions (Addendum)
  Thank you for allowing the Chronic Care Management team to participate in your care.   Goals Addressed            This Visit's Progress   . Covid       CARE PLAN ENTRY (see longtitudinal plan of care for additional care plan information)  Current Barriers:  . Care Management needs related to COVID-19.  Clinical Goal(s):  Over the next 14 days, patient: . Will not require hospitalization or emergent care d/t complications r/t COVID-19. . Will quarantine as advised and utilize measures to prevent further exposure. . Will notify provider if symptoms worsen.   Interventions: . Provided education to enhance basic understanding of COVID-19 as a viral disease, treatment recommendations, measures to prevent further exposure, recommended vaccine schedule, and worsening s/sx that require immediate medical attention.  . Reviewed medications and indications for use.    Patient Self Care Activities:  . Patient will continue to take medications as prescribed . Patient will call provider office for new concerns or questions  Initial goal documentation                   . COMPLETED: Obtain Long Term In-Home Services       Current Barriers:  . Chronic Disease Management support related to Amyotrophic lateral sclerosis (ALS).  Case Manager Clinical Goal(s):  Marland Kitchen Over the next 90 days, patient will not be hospitalized for complications related to chronic illnesses.-Complete . Over the next 90 days, patient will take all medications as prescribed.-Complete . Over the next 90 days, patient will attend all medical appointments as scheduled.-Complete . Over the next 90 days, patient will follow recommended safety precautions to prevent falls and injuries.-Complete . Over the next 60 days, patient will work with Chronic Care Management team to address plan for home health and long term care services.-Complete   Interventions:  .  Discussed current personal care needs and plan for  long term assistance. Approved for services via [facility]. He has also completed the initial assessment with the [facility] Palliative Care team.  Services are temporarily on hold d/t his recent Covid dx. Will resume services in January.    Patient Self Care Activities:  . Attends scheduled provider appointments . Calls pharmacy for medication refills . Calls provider office for new concerns or questions . Unable to perform ADLs independently . Unable to perform IADLs independently  Please see past updates related to this goal by clicking on the "Past Updates" button in the selected goal         Jerry [name] and his spouse Jerry [name] verbalized understanding of the information discussed during the telephonic outreach today. Declined need for mailed/printed information.   A member of the care management team will follow-up with Jerry [name] within the next 30 days.     [facility] Care Management [facility] [phone]

## 2020-07-14 DIAGNOSIS — G1221 Amyotrophic lateral sclerosis: Secondary | ICD-10-CM | POA: Diagnosis not present

## 2020-07-14 DIAGNOSIS — R7303 Prediabetes: Secondary | ICD-10-CM | POA: Diagnosis not present

## 2020-07-14 DIAGNOSIS — Z79899 Other long term (current) drug therapy: Secondary | ICD-10-CM | POA: Diagnosis not present

## 2020-07-14 DIAGNOSIS — Z8616 Personal history of COVID-19: Secondary | ICD-10-CM | POA: Diagnosis not present

## 2020-07-15 ENCOUNTER — Telehealth: Payer: Self-pay

## 2020-07-15 ENCOUNTER — Telehealth: Payer: Self-pay | Admitting: Family Medicine

## 2020-07-15 DIAGNOSIS — R7303 Prediabetes: Secondary | ICD-10-CM | POA: Diagnosis not present

## 2020-07-15 DIAGNOSIS — G1221 Amyotrophic lateral sclerosis: Secondary | ICD-10-CM | POA: Diagnosis not present

## 2020-07-15 DIAGNOSIS — Z79899 Other long term (current) drug therapy: Secondary | ICD-10-CM | POA: Diagnosis not present

## 2020-07-15 DIAGNOSIS — Z8616 Personal history of COVID-19: Secondary | ICD-10-CM | POA: Diagnosis not present

## 2020-07-15 NOTE — Telephone Encounter (Unsigned)
Copied from [place] [phone]. Topic: Quick Communication - Home Health Verbal Orders >> Jul 15, 2020 10:57 AM Gillis Ends D wrote: Caller/Agency: Darnelle calling from [place] Number: [phone] Requesting OT/PT/Skilled Nursing/Social Work/Speech Therapy: PT Frequency: once a week for 5 weeks

## 2020-07-15 NOTE — Telephone Encounter (Signed)
Verbal orders given  

## 2020-07-15 NOTE — Telephone Encounter (Signed)
Sharyn Lull with [facility] calling for home health skilled nursing 1 wk 9  cb [phone] Secure VM  She states keeps changing insurance and they have to keep admitting and discharging him

## 2020-07-16 ENCOUNTER — Other Ambulatory Visit: Payer: Self-pay | Admitting: Emergency Medicine

## 2020-07-16 ENCOUNTER — Telehealth: Payer: Self-pay

## 2020-07-16 DIAGNOSIS — Z8616 Personal history of COVID-19: Secondary | ICD-10-CM | POA: Diagnosis not present

## 2020-07-16 DIAGNOSIS — Z79899 Other long term (current) drug therapy: Secondary | ICD-10-CM | POA: Diagnosis not present

## 2020-07-16 DIAGNOSIS — R7303 Prediabetes: Secondary | ICD-10-CM | POA: Diagnosis not present

## 2020-07-16 DIAGNOSIS — G1221 Amyotrophic lateral sclerosis: Secondary | ICD-10-CM | POA: Diagnosis not present

## 2020-07-16 NOTE — Telephone Encounter (Signed)
At the direction of Palliative NP, message sent to PCP to request an order for hospital bed and hoyer lift to be sent to a DME company.

## 2020-07-23 DIAGNOSIS — R7303 Prediabetes: Secondary | ICD-10-CM | POA: Diagnosis not present

## 2020-07-23 DIAGNOSIS — Z79899 Other long term (current) drug therapy: Secondary | ICD-10-CM | POA: Diagnosis not present

## 2020-07-23 DIAGNOSIS — R471 Dysarthria and anarthria: Secondary | ICD-10-CM | POA: Diagnosis not present

## 2020-07-23 DIAGNOSIS — R1310 Dysphagia, unspecified: Secondary | ICD-10-CM | POA: Diagnosis not present

## 2020-07-23 DIAGNOSIS — Z8616 Personal history of COVID-19: Secondary | ICD-10-CM | POA: Diagnosis not present

## 2020-07-23 DIAGNOSIS — G1221 Amyotrophic lateral sclerosis: Secondary | ICD-10-CM | POA: Diagnosis not present

## 2020-07-24 ENCOUNTER — Telehealth: Payer: Self-pay | Admitting: Family Medicine

## 2020-07-24 DIAGNOSIS — Z8616 Personal history of COVID-19: Secondary | ICD-10-CM | POA: Diagnosis not present

## 2020-07-24 DIAGNOSIS — R7303 Prediabetes: Secondary | ICD-10-CM | POA: Diagnosis not present

## 2020-07-24 DIAGNOSIS — Z79899 Other long term (current) drug therapy: Secondary | ICD-10-CM | POA: Diagnosis not present

## 2020-07-24 DIAGNOSIS — G1221 Amyotrophic lateral sclerosis: Secondary | ICD-10-CM | POA: Diagnosis not present

## 2020-07-24 NOTE — Telephone Encounter (Signed)
Verbal orders given  

## 2020-07-24 NOTE — Telephone Encounter (Signed)
Home Health Verbal Orders - Caller/Agency: [facility] Number: [phone]  Requesting OT/PT/Skilled Nursing/Social Work/Speech Therapy: Speech therapy verbal order one time a week for 9 weeks

## 2020-07-28 ENCOUNTER — Ambulatory Visit: Payer: Self-pay

## 2020-07-28 DIAGNOSIS — G1221 Amyotrophic lateral sclerosis: Secondary | ICD-10-CM

## 2020-07-28 NOTE — Chronic Care Management (AMB) (Signed)
Care Management   Follow Up Note   07/28/2020 Name: Jerry [name] MRN: [id number] DOB: 06/23/1971  Jerry [name] is enrolled in a Medicaid Plan.   Primary Care Provider: [name], MD Reason for referral : Care Management  Jerry [name] is a 50 y.o. year old male who is a primary care patient of [name], MD. The care management team was consulted for assistance with care management and care coordination needs.  A brief telephonic was conducted with his spouse/caregiver Jerry [name] today. He has me his care management goals.  Review of patient status, including review of consultants reports, relevant laboratory and other test results, and collaboration with appropriate care team members and the patient's provider was performed as part of comprehensive patient evaluation and provision of chronic care management services.    Outpatient Encounter Medications as of 07/28/2020  Medication Sig  . benzonatate (TESSALON) 100 MG capsule Take 1-2 capsules (100-200 mg total) by mouth 2 (two) times daily as needed.  . budesonide (PULMICORT) 0.5 MG/2ML nebulizer solution Take by nebulization. (Patient not taking: Reported on 06/19/2020)  . Edaravone (RADICAVA IV) Inject 60 mg into the vein. (Patient not taking: No sig reported)  . EPINEPHrine (EPIPEN 2-PAK) 0.3 mg/0.3 mL IJ SOAJ injection Inject 0.3 mLs (0.3 mg total) into the muscle as needed for anaphylaxis.  . fenofibrate (TRICOR) 145 MG tablet Take 145 mg by mouth daily.  Marland Kitchen guaiFENesin (MUCINEX) 600 MG 12 hr tablet Take 1 tablet (600 mg total) by mouth 2 (two) times daily.  Marland Kitchen loratadine (CLARITIN) 10 MG tablet Take 1 tablet (10 mg total) by mouth daily.  . montelukast (SINGULAIR) 5 MG chewable tablet Chew 10 mg by mouth daily.  . NON FORMULARY Take 1 capsule by mouth daily. Vitamin D3 125 mcg and K2 50 mcg  . RADICAVA 30 MG/100ML SOLN IV infusion Inject into the vein.  . Vitamin D, Ergocalciferol, (DRISDOL) 1.25 MG (50000 UNIT) CAPS capsule  TAKE 1 CAPSULE BY MOUTH EVERY 7 DAYS (Patient not taking: No sig reported)   No facility-administered encounter medications on file as of 07/28/2020.    Goals Addressed            This Visit's Progress   . COMPLETED: Covid       CARE PLAN ENTRY (see longtitudinal plan of care for additional care plan information)  Current Barriers:  . Care Management needs related to COVID-19.  Clinical Goal(s):  Over the next 14 days, patient: . Will not require hospitalization or emergent care d/t complications r/t VHQIO-96. . Will quarantine as advised and utilize measures to prevent further exposure. . Will notify provider if symptoms worsen.   Interventions: . Spoke with spouse/caregiver today. Reviewed medications and s/sx of complications r/t EXBMW-41. Per spouse, he has recovered well. No new or worsening symptoms. Reviewed indications for contacting provider or seeking immediate medical follow-up. Spouse denies further care management needs. Will contact the care management team if additional assistance is required.   Patient Self Care Activities:  . Patient will continue to take medications as prescribed . Patient will call provider office for new concerns or questions  Please see past updates related to this goal by clicking on the "Past Updates" button in the selected goal                       Plan Mr. Bright spouse will contact the care management team if additional assistance is required.    [facility] Care Management  [facility] [phone]

## 2020-07-28 NOTE — Patient Instructions (Signed)
Thank you for allowing the Chronic Care Management team to participate in your care.  Goals Addressed            This Visit's Progress   . COMPLETED: Covid       CARE PLAN ENTRY (see longtitudinal plan of care for additional care plan information)  Current Barriers:  . Care Management needs related to COVID-19.  Clinical Goal(s):  Over the next 14 days, patient: . Will not require hospitalization or emergent care d/t complications r/t XLKGM-01. . Will quarantine as advised and utilize measures to prevent further exposure. . Will notify provider if symptoms worsen.   Interventions: . Spoke with spouse/caregiver today. Reviewed medications and s/sx of complications r/t UUVOZ-36. Per spouse, he has recovered well. No new or worsening symptoms. Reviewed indications for contacting provider or seeking immediate medical follow-up. Spouse denies further care management needs. Will contact the care management team if additional assistance is required.   Patient Self Care Activities:  . Patient will continue to take medications as prescribed . Patient will call provider office for new concerns or questions  Please see past updates related to this goal by clicking on the "Past Updates" button in the selected goal                      Jerry [name] spouse Jerry [name] verbalized understanding of the information discussed during the telephonic outreach today. Agreed to contact the care management team if additional assistance is required.    [name] Care Management [facility] [phone]

## 2020-07-30 ENCOUNTER — Telehealth: Payer: Self-pay | Admitting: Hospice

## 2020-07-30 ENCOUNTER — Other Ambulatory Visit: Payer: Medicaid Other | Admitting: Hospice

## 2020-07-30 ENCOUNTER — Other Ambulatory Visit: Payer: Self-pay

## 2020-07-30 DIAGNOSIS — Z79899 Other long term (current) drug therapy: Secondary | ICD-10-CM | POA: Diagnosis not present

## 2020-07-30 DIAGNOSIS — G1221 Amyotrophic lateral sclerosis: Secondary | ICD-10-CM | POA: Diagnosis not present

## 2020-07-30 DIAGNOSIS — Z8616 Personal history of COVID-19: Secondary | ICD-10-CM | POA: Diagnosis not present

## 2020-07-30 DIAGNOSIS — R7303 Prediabetes: Secondary | ICD-10-CM | POA: Diagnosis not present

## 2020-07-30 NOTE — Telephone Encounter (Signed)
NP called patient to confirm his availability for scheduled visit for 12pm 07/30/2020; not answered. Left him a voicemail with call back number.

## 2020-08-01 DIAGNOSIS — R7303 Prediabetes: Secondary | ICD-10-CM | POA: Diagnosis not present

## 2020-08-01 DIAGNOSIS — G1221 Amyotrophic lateral sclerosis: Secondary | ICD-10-CM | POA: Diagnosis not present

## 2020-08-01 DIAGNOSIS — Z79899 Other long term (current) drug therapy: Secondary | ICD-10-CM | POA: Diagnosis not present

## 2020-08-01 DIAGNOSIS — Z8616 Personal history of COVID-19: Secondary | ICD-10-CM | POA: Diagnosis not present

## 2020-08-04 DIAGNOSIS — R2689 Other abnormalities of gait and mobility: Secondary | ICD-10-CM | POA: Diagnosis not present

## 2020-08-04 DIAGNOSIS — M6281 Muscle weakness (generalized): Secondary | ICD-10-CM | POA: Diagnosis not present

## 2020-08-04 DIAGNOSIS — R262 Difficulty in walking, not elsewhere classified: Secondary | ICD-10-CM | POA: Diagnosis not present

## 2020-08-04 DIAGNOSIS — R531 Weakness: Secondary | ICD-10-CM | POA: Diagnosis not present

## 2020-08-06 DIAGNOSIS — R7303 Prediabetes: Secondary | ICD-10-CM | POA: Diagnosis not present

## 2020-08-06 DIAGNOSIS — G1221 Amyotrophic lateral sclerosis: Secondary | ICD-10-CM | POA: Diagnosis not present

## 2020-08-06 DIAGNOSIS — Z79899 Other long term (current) drug therapy: Secondary | ICD-10-CM | POA: Diagnosis not present

## 2020-08-06 DIAGNOSIS — Z8616 Personal history of COVID-19: Secondary | ICD-10-CM | POA: Diagnosis not present

## 2020-08-07 ENCOUNTER — Other Ambulatory Visit: Payer: Self-pay

## 2020-08-07 ENCOUNTER — Other Ambulatory Visit: Payer: Medicaid Other | Admitting: Student

## 2020-08-07 DIAGNOSIS — G1221 Amyotrophic lateral sclerosis: Secondary | ICD-10-CM | POA: Diagnosis not present

## 2020-08-07 DIAGNOSIS — Z515 Encounter for palliative care: Secondary | ICD-10-CM

## 2020-08-07 DIAGNOSIS — R7303 Prediabetes: Secondary | ICD-10-CM | POA: Diagnosis not present

## 2020-08-07 DIAGNOSIS — Z8616 Personal history of COVID-19: Secondary | ICD-10-CM | POA: Diagnosis not present

## 2020-08-07 DIAGNOSIS — Z79899 Other long term (current) drug therapy: Secondary | ICD-10-CM | POA: Diagnosis not present

## 2020-08-07 NOTE — Progress Notes (Signed)
[facility] Consult Note Telephone: [phone]  Fax: [phone]  PATIENT NAME: Jerry [name] [address] [place] [address] [phone] (home)  DOB: Aug 03, 1970 MRN: [id number]  PRIMARY CARE PROVIDER:    [name], MD,  [address] [place] [address] [phone]  [place]:   Jerry [name], [place],  [place] [address] [phone]  RESPONSIBLE PARTY:   Extended Emergency Contact Information Primary Emergency Contact: Jerry [name] Address: [address]          [place], [place] [address] Jerry [name] of [place] Phone: [phone] Relation: Spouse  I met face to face with patient and family in home/facility.    ASSESSMENT AND RECOMMENDATIONS:   Advance Care Planning: Visit at the request of Dr. Ancil [name] for a palliative consult. Visit consisted of building trust and discussions on Palliative Medicine as specialized medical care for people living with serious illness, aimed at facilitating better quality of life through symptoms relief, assisting with advance care plan and establishing goals of care. MOST form reviewed today; no changes. Family expressed appreciation for education provided on Palliative care and how that differs from Hospice service. Palliative care will continue to provide support to patient, family and the medical team.  Goal of care: Continue receiving PT and ST as directed due to impaired mobility, assist with transfers, speech, swallowing. Patient states he wishes to regain some physical functioning.   Directives: MOST form in place; attempt CPR, Full scope of treatment, antibiotics, IV fluids as indicated, FT for defined trial period.   Symptom Management: Constipation-recommend Senna liquid 8.106m/5ml 117mPO BID, continue Miralax 17gm PO daily. Continue to eat fiber rich foods.   Follow up Palliative Care Visit: Palliative care will  continue to follow for goals of care clarification and symptom management. Ongoing assessment of hospice eligibility; does not appear to eligible for Hospice services at this time. Return in 4 weeks or prn.  Family /Caregiver/Community Supports: Palliative will continue to provide emotional support to patient and caregivers.   ALS, cognitive / functional decline: Patient is dependent for all adl's. He is currently receiving PT to assist with transfers, mobility. Recommend-Continue PT as directed. Follow up with Neurologist as scheduled.  I spent 40 minutes providing this consultation, time includes time spent with patient/family, chart review, provider coordination, and documentation. More than 50% of the time in this consultation was spent counseling and coordinating communication.   CHIEF COMPLAINT: Palliative follow up, "I would like to regain use of my legs again."  History obtained from review of EMR, discussion/ interview with patient, family, caregiver. Records reviewed and summarized below.  HISTORY OF PRESENT ILLNESS:  AaChristian [name] a [age].o. year old male with multiple medical problems including ALS. Palliative Care was asked to follow this patient by consultation request of Jerry [name] to help address advance care planning and goals of care. This is a follow up visit. Patient and wife report doing okay overall. [name] report having more difficulty getting around his home. He reports increased muscle atrophy; loss use of left arm in April/May 2020, right arm August 2021; he was still able to use his power chair. He was still ambulating short distances up until October 2021. He now is able to stand, pivot short steps with assist x 2. He is mostly upstairs, but therapy is working on transfers. He primarily sleeps in recliner. Requires 2-3 persons for transfers; has a hoyer lift downstairs.  Wife is working on remodeling rooms downstairs which would allow resident to be out of  bedroom more comfortably. Reports coughing, mostly at bedtime; no swallowing difficulty. Able to speak, convey needs; speaks in soft, slow tone.  Endorses a good appetite, although states clothes are fitting slightly looser. No recent infections or skin breakdown. Jerry [name] reports stopping Radicava about 3 weeks ago secondary to severe constipation and itching. Wife had been infusing in the home. Jerry [name] and wife would like to speak with Neurologist regarding a more holistic approach to his ALS.   CODE STATUS: Full Code.  PPS: 40%  HOSPICE ELIGIBILITY/DIAGNOSIS: TBD  PHYSICAL EXAM / ROS:   Last weight 181 pounds per patient and wife General: NAD, well groomed Cardiovascular: no chest pain reported, no edema  Pulmonary: no cough, respirations even/unlabored, on room air GI: appetite good, endorses constipation, continent of bowel GU: denies dysuria, continent of urine MSK:  no joint and ROM abnormalities, ambulatory Skin: no rashes or wounds reported Neurological: Alert and oriented x 3, speech slow but able to convey needs Psych: non anxious, pleasant affect  PAST MEDICAL HISTORY:  Past Medical History:  Diagnosis Date  . ALS (amyotrophic lateral sclerosis) ([facility])   . COVID-19 virus infection 06/2020  . Meningitis 2003   spinal  . Morbid obesity (Jerry [name])   . Prediabetes 11/05/2016   A1C 5.7 on 11/05/16    SOCIAL HX:  Social History   Tobacco Use  . Smoking status: Never Smoker  . Smokeless tobacco: Never Used  Substance Use Topics  . Alcohol use: No    Alcohol/week: 0.0 standard drinks   FAMILY HX:  Family History  Problem Relation Age of Onset  . Diabetes Father   . Diabetes Mother   . Diabetes Sister   . Hypertension Sister   . Diabetes Brother   . Diabetes Sister   . Fibromyalgia Sister   . Diabetes Sister     ALLERGIES:  Allergies  Allergen Reactions  . Crab [Shellfish Allergy] Rash  . Other Rash     PERTINENT MEDICATIONS:  Outpatient Encounter  Medications as of 08/07/2020  Medication Sig  . benzonatate (TESSALON) 100 MG capsule Take 1-2 capsules (100-200 mg total) by mouth 2 (two) times daily as needed.  . budesonide (PULMICORT) 0.5 MG/2ML nebulizer solution Take by nebulization. (Patient not taking: Reported on 06/19/2020)  . Edaravone (RADICAVA IV) Inject 60 mg into the vein. (Patient not taking: No sig reported)  . EPINEPHrine (EPIPEN 2-PAK) 0.3 mg/0.3 mL IJ SOAJ injection Inject 0.3 mLs (0.3 mg total) into the muscle as needed for anaphylaxis.  . fenofibrate (TRICOR) 145 MG tablet Take 145 mg by mouth daily.  Marland Kitchen guaiFENesin (MUCINEX) 600 MG 12 hr tablet Take 1 tablet (600 mg total) by mouth 2 (two) times daily.  Marland Kitchen loratadine (CLARITIN) 10 MG tablet Take 1 tablet (10 mg total) by mouth daily.  . montelukast (SINGULAIR) 5 MG chewable tablet Chew 10 mg by mouth daily.  . NON FORMULARY Take 1 capsule by mouth daily. Vitamin D3 125 mcg and K2 50 mcg  . RADICAVA 30 MG/100ML SOLN IV infusion Inject into the vein.  . Vitamin D, Ergocalciferol, (DRISDOL) 1.25 MG (50000 UNIT) CAPS capsule TAKE 1 CAPSULE BY MOUTH EVERY 7 DAYS (Patient not taking: No sig reported)   No facility-administered encounter medications on file as of 08/07/2020.     Thank you for the opportunity to participate in the care of Mr. Mangione. The palliative care team will continue to  follow. Please call our office at [phone] if we can be of additional assistance.  Ezekiel Slocumb, NP , AGPCNP-C

## 2020-08-13 ENCOUNTER — Ambulatory Visit: Payer: Self-pay

## 2020-08-13 DIAGNOSIS — R7303 Prediabetes: Secondary | ICD-10-CM | POA: Diagnosis not present

## 2020-08-13 DIAGNOSIS — G1221 Amyotrophic lateral sclerosis: Secondary | ICD-10-CM | POA: Diagnosis not present

## 2020-08-13 DIAGNOSIS — Z79899 Other long term (current) drug therapy: Secondary | ICD-10-CM | POA: Diagnosis not present

## 2020-08-13 DIAGNOSIS — Z8616 Personal history of COVID-19: Secondary | ICD-10-CM | POA: Diagnosis not present

## 2020-08-14 DIAGNOSIS — G1221 Amyotrophic lateral sclerosis: Secondary | ICD-10-CM | POA: Diagnosis not present

## 2020-08-14 DIAGNOSIS — R7303 Prediabetes: Secondary | ICD-10-CM | POA: Diagnosis not present

## 2020-08-14 DIAGNOSIS — Z8616 Personal history of COVID-19: Secondary | ICD-10-CM | POA: Diagnosis not present

## 2020-08-14 DIAGNOSIS — Z79899 Other long term (current) drug therapy: Secondary | ICD-10-CM | POA: Diagnosis not present

## 2020-08-15 DIAGNOSIS — G1221 Amyotrophic lateral sclerosis: Secondary | ICD-10-CM | POA: Diagnosis not present

## 2020-08-15 DIAGNOSIS — R7303 Prediabetes: Secondary | ICD-10-CM | POA: Diagnosis not present

## 2020-08-15 DIAGNOSIS — Z79899 Other long term (current) drug therapy: Secondary | ICD-10-CM | POA: Diagnosis not present

## 2020-08-15 DIAGNOSIS — Z8616 Personal history of COVID-19: Secondary | ICD-10-CM | POA: Diagnosis not present

## 2020-08-16 DIAGNOSIS — G1221 Amyotrophic lateral sclerosis: Secondary | ICD-10-CM | POA: Diagnosis not present

## 2020-08-20 ENCOUNTER — Telehealth: Payer: Self-pay | Admitting: Family Medicine

## 2020-08-20 DIAGNOSIS — R7303 Prediabetes: Secondary | ICD-10-CM | POA: Diagnosis not present

## 2020-08-20 DIAGNOSIS — Z79899 Other long term (current) drug therapy: Secondary | ICD-10-CM | POA: Diagnosis not present

## 2020-08-20 DIAGNOSIS — Z8616 Personal history of COVID-19: Secondary | ICD-10-CM | POA: Diagnosis not present

## 2020-08-20 DIAGNOSIS — G1221 Amyotrophic lateral sclerosis: Secondary | ICD-10-CM | POA: Diagnosis not present

## 2020-08-20 NOTE — Telephone Encounter (Signed)
Jeri lynn with [facility] would like verbal orders to extend pt's home health PT to  1 wk 3  cb [phone]

## 2020-08-21 DIAGNOSIS — R7303 Prediabetes: Secondary | ICD-10-CM | POA: Diagnosis not present

## 2020-08-21 DIAGNOSIS — Z79899 Other long term (current) drug therapy: Secondary | ICD-10-CM | POA: Diagnosis not present

## 2020-08-21 DIAGNOSIS — G1221 Amyotrophic lateral sclerosis: Secondary | ICD-10-CM | POA: Diagnosis not present

## 2020-08-21 DIAGNOSIS — Z8616 Personal history of COVID-19: Secondary | ICD-10-CM | POA: Diagnosis not present

## 2020-08-21 NOTE — Telephone Encounter (Signed)
Verbal orders given  

## 2020-08-25 ENCOUNTER — Telehealth: Payer: Self-pay | Admitting: Family Medicine

## 2020-08-25 NOTE — Telephone Encounter (Signed)
Copied from [place] [phone]. Topic: Medicare AWV >> Aug 25, 2020  9:50 AM Cher Nakai R wrote: Reason for CRM: Left message for patient to call back and schedule Medicare Annual Wellness Visit (AWV) in office.   If unable to come into the office for AWV,  please offer to do virtually or by telephone.  No hx of AWV eligible as of 10/04/2019 awvi  Please schedule at anytime with [facility].      40 Minutes appointment   Any questions, please call me at [phone]

## 2020-08-25 NOTE — Chronic Care Management (AMB) (Signed)
  Care Management   Note   Name: Jerry [name] MRN: [id number] DOB: 05-27-1971     Returned call to [name]. Requested assistance with obtaining additional Home Health services.  Confirmed with [facility] staff that Jerry [name]'s therapy services will likely be extended for another session. Staff will add health aide services to assist with bathing at least once a week.   PLAN Jerry [name] will contact the care management team if additional assistance is required.   [name] Care Management [facility] [phone]

## 2020-08-27 DIAGNOSIS — Z79899 Other long term (current) drug therapy: Secondary | ICD-10-CM | POA: Diagnosis not present

## 2020-08-27 DIAGNOSIS — G1221 Amyotrophic lateral sclerosis: Secondary | ICD-10-CM | POA: Diagnosis not present

## 2020-08-27 DIAGNOSIS — Z8616 Personal history of COVID-19: Secondary | ICD-10-CM | POA: Diagnosis not present

## 2020-08-27 DIAGNOSIS — R7303 Prediabetes: Secondary | ICD-10-CM | POA: Diagnosis not present

## 2020-09-01 DIAGNOSIS — M6281 Muscle weakness (generalized): Secondary | ICD-10-CM | POA: Diagnosis not present

## 2020-09-01 DIAGNOSIS — R262 Difficulty in walking, not elsewhere classified: Secondary | ICD-10-CM | POA: Diagnosis not present

## 2020-09-01 DIAGNOSIS — R2689 Other abnormalities of gait and mobility: Secondary | ICD-10-CM | POA: Diagnosis not present

## 2020-09-01 DIAGNOSIS — R531 Weakness: Secondary | ICD-10-CM | POA: Diagnosis not present

## 2020-09-03 DIAGNOSIS — R7303 Prediabetes: Secondary | ICD-10-CM | POA: Diagnosis not present

## 2020-09-03 DIAGNOSIS — Z8616 Personal history of COVID-19: Secondary | ICD-10-CM | POA: Diagnosis not present

## 2020-09-03 DIAGNOSIS — Z79899 Other long term (current) drug therapy: Secondary | ICD-10-CM | POA: Diagnosis not present

## 2020-09-03 DIAGNOSIS — G1221 Amyotrophic lateral sclerosis: Secondary | ICD-10-CM | POA: Diagnosis not present

## 2020-09-04 ENCOUNTER — Ambulatory Visit (INDEPENDENT_AMBULATORY_CARE_PROVIDER_SITE_OTHER): Payer: Medicare HMO

## 2020-09-04 DIAGNOSIS — Z0000 Encounter for general adult medical examination without abnormal findings: Secondary | ICD-10-CM

## 2020-09-04 NOTE — Patient Instructions (Signed)
[name] , Thank you for taking time to come for your Medicare Wellness Visit. I appreciate your ongoing commitment to your health goals. Please review the following plan we discussed and let me know if I can assist you in the future.   Screening recommendations/referrals: Colonoscopy: n/a Recommended yearly ophthalmology/optometry visit for glaucoma screening and checkup Recommended yearly dental visit for hygiene and checkup  Vaccinations: Influenza vaccine: due Pneumococcal vaccine: done 04/04/19 Tdap vaccine: done 11/04/14 Shingles vaccine: n/a   Covid-19: done 09/29/19 & 10/30/19  Conditions/risks identified: Recommend continuing fall prevention in the home.   Next appointment: Follow up in one year for your annual wellness visit   Preventive Care 40-64 Years, Male Preventive care refers to lifestyle choices and visits with your health care provider that can promote health and wellness. What does preventive care include?  A yearly physical exam. This is also called an annual well check.  Dental exams once or twice a year.  Routine eye exams. Ask your health care provider how often you should have your eyes checked.  Personal lifestyle choices, including:  Daily care of your teeth and gums.  Regular physical activity.  Eating a healthy diet.  Avoiding tobacco and drug use.  Limiting alcohol use.  Practicing safe sex.  Taking low-dose aspirin every day starting at age 61. What happens during an annual well check? The services and screenings done by your health care provider during your annual well check will depend on your age, overall health, lifestyle risk factors, and family history of disease. Counseling  Your health care provider may ask you questions about your:  Alcohol use.  Tobacco use.  Drug use.  Emotional well-being.  Home and relationship well-being.  Sexual activity.  Eating habits.  Work and work Statistician. Screening  You may have the  following tests or measurements:  Height, weight, and BMI.  Blood pressure.  Lipid and cholesterol levels. These may be checked every 5 years, or more frequently if you are over 23 years old.  Skin check.  Lung cancer screening. You may have this screening every year starting at age 73 if you have a 30-pack-year history of smoking and currently smoke or have quit within the past 15 years.  Fecal occult blood test (FOBT) of the stool. You may have this test every year starting at age 50.  Flexible sigmoidoscopy or colonoscopy. You may have a sigmoidoscopy every 5 years or a colonoscopy every 10 years starting at age 81.  Prostate cancer screening. Recommendations will vary depending on your family history and other risks.  Hepatitis C blood test.  Hepatitis B blood test.  Sexually transmitted disease (STD) testing.  Diabetes screening. This is done by checking your blood sugar (glucose) after you have not eaten for a while (fasting). You may have this done every 1-3 years. Discuss your test results, treatment options, and if necessary, the need for more tests with your health care provider. Vaccines  Your health care provider may recommend certain vaccines, such as:  Influenza vaccine. This is recommended every year.  Tetanus, diphtheria, and acellular pertussis (Tdap, Td) vaccine. You may need a Td booster every 10 years.  Zoster vaccine. You may need this after age 89.  Pneumococcal 13-valent conjugate (PCV13) vaccine. You may need this if you have certain conditions and have not been vaccinated.  Pneumococcal polysaccharide (PPSV23) vaccine. You may need one or two doses if you smoke cigarettes or if you have certain conditions. Talk to your health care  provider about which screenings and vaccines you need and how often you need them. This information is not intended to replace advice given to you by your health care provider. Make sure you discuss any questions you have with  your health care provider. Document Released: 07/18/2015 Document Revised: 03/10/2016 Document Reviewed: 04/22/2015 Elsevier Interactive Patient Education  2017 [place] Prevention in the Home Falls can cause injuries. They can happen to people of all ages. There are many things you can do to make your home safe and to help prevent falls. What can I do on the outside of my home?  Regularly fix the edges of walkways and driveways and fix any cracks.  Remove anything that might make you trip as you walk through a door, such as a raised step or threshold.  Trim any bushes or trees on the path to your home.  Use bright outdoor lighting.  Clear any walking paths of anything that might make someone trip, such as rocks or tools.  Regularly check to see if handrails are loose or broken. Make sure that both sides of any steps have handrails.  Any raised decks and porches should have guardrails on the edges.  Have any leaves, snow, or ice cleared regularly.  Use sand or salt on walking paths during winter.  Clean up any spills in your garage right away. This includes oil or grease spills. What can I do in the bathroom?  Use night lights.  Install grab bars by the toilet and in the tub and shower. Do not use towel bars as grab bars.  Use non-skid mats or decals in the tub or shower.  If you need to sit down in the shower, use a plastic, non-slip stool.  Keep the floor dry. Clean up any water that spills on the floor as soon as it happens.  Remove soap buildup in the tub or shower regularly.  Attach bath mats securely with double-sided non-slip rug tape.  Do not have throw rugs and other things on the floor that can make you trip. What can I do in the bedroom?  Use night lights.  Make sure that you have a light by your bed that is easy to reach.  Do not use any sheets or blankets that are too big for your bed. They should not hang down onto the floor.  Have a firm  chair that has side arms. You can use this for support while you get dressed.  Do not have throw rugs and other things on the floor that can make you trip. What can I do in the kitchen?  Clean up any spills right away.  Avoid walking on wet floors.  Keep items that you use a lot in easy-to-reach places.  If you need to reach something above you, use a strong step stool that has a grab bar.  Keep electrical cords out of the way.  Do not use floor polish or wax that makes floors slippery. If you must use wax, use non-skid floor wax.  Do not have throw rugs and other things on the floor that can make you trip. What can I do with my stairs?  Do not leave any items on the stairs.  Make sure that there are handrails on both sides of the stairs and use them. Fix handrails that are broken or loose. Make sure that handrails are as long as the stairways.  Check any carpeting to make sure that it is firmly attached  to the stairs. Fix any carpet that is loose or worn.  Avoid having throw rugs at the top or bottom of the stairs. If you do have throw rugs, attach them to the floor with carpet tape.  Make sure that you have a light switch at the top of the stairs and the bottom of the stairs. If you do not have them, ask someone to add them for you. What else can I do to help prevent falls?  Wear shoes that:  Do not have high heels.  Have rubber bottoms.  Are comfortable and fit you well.  Are closed at the toe. Do not wear sandals.  If you use a stepladder:  Make sure that it is fully opened. Do not climb a closed stepladder.  Make sure that both sides of the stepladder are locked into place.  Ask someone to hold it for you, if possible.  Clearly mark and make sure that you can see:  Any grab bars or handrails.  First and last steps.  Where the edge of each step is.  Use tools that help you move around (mobility aids) if they are needed. These  include:  Canes.  Walkers.  Scooters.  Crutches.  Turn on the lights when you go into a dark area. Replace any light bulbs as soon as they burn out.  Set up your furniture so you have a clear path. Avoid moving your furniture around.  If any of your floors are uneven, fix them.  If there are any pets around you, be aware of where they are.  Review your medicines with your doctor. Some medicines can make you feel dizzy. This can increase your chance of falling. Ask your doctor what other things that you can do to help prevent falls. This information is not intended to replace advice given to you by your health care provider. Make sure you discuss any questions you have with your health care provider. Document Released: 04/17/2009 Document Revised: 11/27/2015 Document Reviewed: 07/26/2014 Elsevier Interactive Patient Education  2017 [employer].

## 2020-09-04 NOTE — Progress Notes (Signed)
Subjective:   Jerry [name] is a 50 y.o. male who presents for Medicare Annual/Subsequent preventive examination.  Virtual Visit via Telephone Note  I connected with  Jerry [name] on 09/04/20 at  2:50 PM EST by telephone and verified that I am speaking with the correct person using two identifiers.  Location: Patient: home Provider: [place] Persons participating in the virtual visit: patient & wife [place] Advisor   I discussed the limitations, risks, security and privacy concerns of performing an evaluation and management service by telephone and the availability of in person appointments. The patient expressed understanding and agreed to proceed.  Interactive audio and video telecommunications were attempted between this nurse and patient, however failed, due to patient having technical difficulties OR patient did not have access to video capability.  We continued and completed visit with audio only.  Some vital signs may be absent or patient reported.   Jerry Marker, LPN   Review of Systems     Cardiac Risk Factors include: male gender;sedentary lifestyle     Objective:    There were no vitals filed for this visit. There is no height or weight on file to calculate BMI.  Advanced Directives 09/04/2020 11/04/2016 11/27/2014  Does Patient Have a Medical Advance Directive? Yes No No  Type of Paramedic of [place];Living will - -  Copy of [place] in Chart? Yes - validated most recent copy scanned in chart (See row information) - -    Current Medications (verified) Outpatient Encounter Medications as of 09/04/2020  Medication Sig  . Black Elderberry (SAMBUCUS ELDERBERRY) 50 MG/5ML SYRP Take by mouth.  . EPINEPHrine (EPIPEN 2-PAK) 0.3 mg/0.3 mL IJ SOAJ injection Inject 0.3 mLs (0.3 mg total) into the muscle as needed for anaphylaxis.  . Magnesium 200 MG CHEW Chew by mouth.  . Potassium 99 MG TABS Take by mouth.  Marland Kitchen VITAMIN  D-VITAMIN K PO Take by mouth. Liquid drop  . [DISCONTINUED] benzonatate (TESSALON) 100 MG capsule Take 1-2 capsules (100-200 mg total) by mouth 2 (two) times daily as needed.  . [DISCONTINUED] budesonide (PULMICORT) 0.5 MG/2ML nebulizer solution Take by nebulization. (Patient not taking: Reported on 06/19/2020)  . [DISCONTINUED] Edaravone (RADICAVA IV) Inject 60 mg into the vein. (Patient not taking: No sig reported)  . [DISCONTINUED] fenofibrate (TRICOR) 145 MG tablet Take 145 mg by mouth daily.  . [DISCONTINUED] guaiFENesin (MUCINEX) 600 MG 12 hr tablet Take 1 tablet (600 mg total) by mouth 2 (two) times daily.  . [DISCONTINUED] loratadine (CLARITIN) 10 MG tablet Take 1 tablet (10 mg total) by mouth daily. (Patient not taking: Reported on 09/04/2020)  . [DISCONTINUED] montelukast (SINGULAIR) 5 MG chewable tablet Chew 10 mg by mouth daily. (Patient not taking: Reported on 09/04/2020)  . [DISCONTINUED] NON FORMULARY Take 1 capsule by mouth daily. Vitamin D3 125 mcg and K2 50 mcg  . [DISCONTINUED] RADICAVA 30 MG/100ML SOLN IV infusion Inject into the vein.  . [DISCONTINUED] Vitamin D, Ergocalciferol, (DRISDOL) 1.25 MG (50000 UNIT) CAPS capsule TAKE 1 CAPSULE BY MOUTH EVERY 7 DAYS (Patient not taking: No sig reported)   No facility-administered encounter medications on file as of 09/04/2020.    Allergies (verified) Crab [shellfish allergy] and Other   History: Past Medical History:  Diagnosis Date  . ALS (amyotrophic lateral sclerosis) ([place])   . COVID-19 virus infection 06/2020  . Meningitis 2003   spinal  . Morbid obesity ([place])   . Prediabetes 11/05/2016   A1C 5.7 on  11/05/16   Past Surgical History:  Procedure Laterality Date  . MASS EXCISION Right 11/26/2014   Procedure: EXCISION MASS/GROIN EXCISION MASS;  Surgeon: Robert Bellow, MD;  Location: [facility] ORS;  Service: General;  Laterality: Right;  . MASS EXCISION Right 11/26/2014   Procedure: MINOR EXCISION OF MASS/POST THIGH MASS;  Surgeon:  Robert Bellow, MD;  Location: [facility] ORS;  Service: General;  Laterality: Right;  . PORTACATH PLACEMENT Right 06/19/2019   Family History  Problem Relation Age of Onset  . Diabetes Father   . Diabetes Mother   . Diabetes Sister   . Hypertension Sister   . Diabetes Brother   . Diabetes Sister   . Fibromyalgia Sister   . Diabetes Sister    Social History   Socioeconomic History  . Marital status: Married    Spouse name: Jerry [name]  . Number of children: 3  . Years of education: Not on file  . Highest education level: High school graduate  Occupational History  . Occupation: Mining engineer    Comment: self employed  . Occupation: Ship broker    Comment: [employer]  Tobacco Use  . Smoking status: Never Smoker  . Smokeless tobacco: Never Used  Vaping Use  . Vaping Use: Never used  Substance and Sexual Activity  . Alcohol use: No    Alcohol/week: 0.0 standard drinks  . Drug use: No  . Sexual activity: Yes    Partners: Female  Other Topics Concern  . Not on file  Social History Narrative   Lives with wife and 3 children   Approved for disability 02/2019 for ALS    Social Determinants of Health   Financial Resource Strain: Low Risk   . Difficulty of Paying Living Expenses: Not very hard  Food Insecurity: No Food Insecurity  . Worried About Charity fundraiser in the Last Year: Never true  . Ran Out of Food in the Last Year: Never true  Transportation Needs: No Transportation Needs  . Lack of Transportation (Medical): No  . Lack of Transportation (Non-Medical): No  Physical Activity: Inactive  . Days of Exercise per Week: 0 days  . Minutes of Exercise per Session: 0 min  Stress: No Stress Concern Present  . Feeling of Stress : Not at all  Social Connections: Moderately Isolated  . Frequency of Communication with Friends and Family: More than three times a week  . Frequency of Social Gatherings with Friends and Family: More than three times a week  . Attends Religious  Services: Never  . Active Member of Clubs or Organizations: No  . Attends Archivist Meetings: Never  . Marital Status: Married    Tobacco Counseling Counseling given: Not Answered   Clinical Intake:  Pre-visit preparation completed: Yes  Pain : No/denies pain     Nutritional Risks: None Diabetes: No  How often do you need to have someone help you when you read instructions, pamphlets, or other written materials from your doctor or pharmacy?: 1 - Never    Interpreter Needed?: No  Information entered by :: Jerry Marker LPN   Activities of Daily Living In your present state of health, do you have any difficulty performing the following activities: 09/04/2020 06/18/2020  Hearing? N N  Comment declines hearing aids -  Vision? N N  Difficulty concentrating or making decisions? N N  Walking or climbing stairs? Y Y  Dressing or bathing? Y Y  Doing errands, shopping? [place] Donning  Preparing Food and eating ?  Y -  Using the Toilet? Y -  In the past six months, have you accidently leaked urine? Y -  Do you have problems with loss of bowel control? Y -  Managing your Medications? Y -  Managing your Finances? Y -  Housekeeping or managing your Housekeeping? Y -  Some recent data might be hidden    Patient Care Team: [name], MD as PCP - General (Family Medicine) Caress, Jeneen Rinks, MD as Referring Physician (Neurology)  Indicate any recent Medical Services you may have received from other than [facility] providers in the past year (date may be approximate).     Assessment:   This is a routine wellness examination for Jerry [name].  Hearing/Vision screen  Hearing Screening   125Hz  250Hz  500Hz  1000Hz  2000Hz  3000Hz  4000Hz  6000Hz  8000Hz   Right ear:           Left ear:           Comments: Pt denies hearing difficulty  Vision Screening Comments: Pt past due for eye exam  Dietary issues and exercise activities discussed: Current Exercise Habits: The patient does not participate  in regular exercise at present, Exercise limited by: neurologic condition(s)  Goals   None    Depression Screen PHQ 2/9 Scores 09/04/2020 06/18/2020 06/09/2020 03/21/2020 01/10/2020 07/19/2019 06/27/2019  PHQ - 2 Score 0 0 0 0 0 0 0  PHQ- 9 Score - - - - 0 0 -    Fall Risk Fall Risk  09/04/2020 06/18/2020 06/09/2020 03/21/2020 01/10/2020  Falls in the past year? 0 0 0 1 1  Number falls in past yr: 0 0 0 1 0  Comment - - - - -  Injury with Fall? 0 0 0 0 0  Comment - - - - -  Risk for fall due to : Impaired balance/gait;Impaired mobility Impaired mobility - Impaired mobility;History of fall(s) -  Risk for fall due to: Comment - - - - -  Follow up Falls prevention discussed - - - -    FALL RISK PREVENTION PERTAINING TO THE HOME:  Any stairs in or around the home? Yes  If so, are there any without handrails? No  Home free of loose throw rugs in walkways, pet beds, electrical cords, etc? Yes  Adequate lighting in your home to reduce risk of falls? Yes   ASSISTIVE DEVICES UTILIZED TO PREVENT FALLS:  Life alert? No  Use of a cane, walker or w/c? Yes  Grab bars in the bathroom? Yes  Shower chair or bench in shower? Yes  Elevated toilet seat or a handicapped toilet? Yes   TIMED UP AND GO:  Was the test performed? No . Telephonic visit  Cognitive Function: Normal cognitive status assessed by direct observation by this Nurse Health Advisor. No abnormalities found.          Immunizations Immunization History  Administered Date(s) Administered  . Influenza Split 06/15/2015  . Influenza,inj,Quad PF,6+ Mos 09/21/2016, 05/30/2017, 04/28/2018, 04/04/2019  . PFIZER(Purple Top)SARS-COV-2 Vaccination 09/29/2019, 10/25/2019  . Pneumococcal Polysaccharide-23 04/04/2019  . Td 11/04/2014    TDAP status: Up to date  Flu Vaccine status: Due, Education has been provided regarding the importance of this vaccine. Advised may receive this vaccine at local pharmacy or Health Dept. Aware to provide a  copy of the vaccination record if obtained from local pharmacy or Health Dept. Verbalized acceptance and understanding.  Pneumococcal vaccine status: Up to date  Covid-19 vaccine status: Completed vaccines  Qualifies for Shingles Vaccine? No  Screening Tests Health Maintenance  Topic Date Due  . COLONOSCOPY (Pts 45-71yrs Insurance coverage will need to be confirmed)  Never done  . COVID-19 Vaccine (3 - Booster for Pfizer series) 04/25/2020  . INFLUENZA VACCINE  10/02/2020 (Originally 02/03/2020)  . TETANUS/TDAP  11/03/2024  . Hepatitis C Screening  Completed  . HIV Screening  Completed  . HPV VACCINES  Aged Out    Health Maintenance  Health Maintenance Due  Topic Date Due  . COLONOSCOPY (Pts 45-49yrs Insurance coverage will need to be confirmed)  Never done  . COVID-19 Vaccine (3 - Booster for Pfizer series) 04/25/2020   Colorectal cancer screening: due age 44  Lung Cancer Screening: (Low Dose CT Chest recommended if Age 71-80 years, 30 pack-year currently smoking OR have quit w/in 15years.) does not qualify.   Additional Screening:  Hepatitis C Screening: does qualify; Completed 04/28/18  Vision Screening: Recommended annual ophthalmology exams for early detection of glaucoma and other disorders of the eye. Is the patient up to date with their annual eye exam?  No  Who is the provider or what is the name of the office in which the patient attends annual eye exams? Not established  Dental Screening: Recommended annual dental exams for proper oral hygiene  Community Resource Referral / Chronic Care Management: CRR required this visit?  No   CCM required this visit?  No      Plan:     I have personally reviewed and noted the following in the patient's chart:   . Medical and social history . Use of alcohol, tobacco or illicit drugs  . Current medications and supplements . Functional ability and status . Nutritional status . Physical activity . Advanced  directives . List of other physicians . Hospitalizations, surgeries, and ER visits in previous 12 months . Vitals . Screenings to include cognitive, depression, and falls . Referrals and appointments  In addition, I have reviewed and discussed with patient certain preventive protocols, quality metrics, and best practice recommendations. A written personalized care plan for preventive services as well as general preventive health recommendations were provided to patient.     Jerry Marker, LPN   08/13/2444   Nurse Notes: Patient's wife Jerry [name] states they are in need of personal care services for patient. They last had PCS in August 2021. Pt has follow up visit with palliative care tomorrow. Patient is requesting [facility] [facility]. Per patient they will need to come out and do a reassessment and that requires a physician order that can be done on their website. Patient is currently receiving physical and speech therapy from [facility]. Pt's wife has also spoken with social work at [facility] and given a list from the [employer] of agencies but has not had any follow up since January. Message sent to Donald Siva RN CCM to follow up for care management needs.

## 2020-09-05 ENCOUNTER — Other Ambulatory Visit: Payer: Medicaid Other | Admitting: Student

## 2020-09-05 ENCOUNTER — Other Ambulatory Visit: Payer: Self-pay

## 2020-09-05 DIAGNOSIS — Z515 Encounter for palliative care: Secondary | ICD-10-CM

## 2020-09-05 NOTE — Progress Notes (Signed)
[facility] Consult Note Telephone: [phone]  Fax: [phone]  PATIENT NAME: Jerry [name] [address] [place] [address] [phone] (home)  DOB: February 23, 1971 MRN: [id number]  PRIMARY CARE PROVIDER:    [name], MD,  [address] [place] [address] [phone]  [place]:   [name], [place],  [place] [address] [phone]  RESPONSIBLE PARTY:   Extended Emergency Contact Information Primary Emergency Contact: [name] Address: [address]          [place], [place] [address] Johnnette Litter of [place] Phone: [phone] Relation: Spouse  I met face to face with patient and family in patient's home.  ASSESSMENT AND RECOMMENDATIONS:   Advance Care Planning: Visit at the request of Dr. Ancil Boozer for palliative consult. Visit consisted of building trust and discussions on Palliative care medicine as specialized medical care for people living with serious illness, aimed at facilitating improved quality of life through symptoms relief, assisting with advance care planning and establishing goals of care. Education provided on Palliative vs. Hospice services. Palliative care will continue to provide support to patient, family and the medical team.  Goal of care: To regain strength; to get additional support for both patient and wife.   Directives: MOST form in place; attempt CPR, Full scope of treatment, antibiotics, IV fluids as indicated, FT for defined trial period.    Symptom Management:   ALS-patient is dependent for all adl's. He is currently receiving PT and [facility]. Recommend continuing therapy services as directed. Education provided on aspiration precautions. Patient is following a plant based diet. Follow up with Neurologist as scheduled.   Constipation-patient reports having regular bowel movements now; continue well  balanced diet, adequate fluids encouraged. Recommend senna prn, Miralax prn.   Follow up Palliative Care Visit: Palliative care will continue to follow for complex decision making and symptom management. Return in 8 weeks or prn.  Family /Caregiver/Community Supports: [facility], PT, [place] provides 6 hours of support a week, primarily doing stretches/ROM exercises. Will refer to Palliative SW as patient would like to see if there are any additional resources for help in the home.    I spent 40 minutes providing this consultation, from 1:30pm to 2:10pm. Time includes time spent with patient/family, chart review, provider coordination, and documentation. More than 50% of the time in this consultation was spent counseling and coordinating communication.   CHIEF COMPLAINT: Palliative Medicine follow up visit.   History obtained from review of EMR, discussion with primary team, and interview with family. Records reviewed and summarized below.  HISTORY OF PRESENT ILLNESS:  Turner Baillie is a 50 y.o. year old male with multiple medical problems including ALS, constipation. Palliative Care was asked to follow this patient by consultation request of [name], MD to help address advance care planning and goals of care. This is a follow up visit.  Patient reports feeling good. He states he is still participating in therapy, working on transfers. He hopes to be able to go down stairs in his home. He is dependent for all adl's. He denies swallowing difficulty; states he takes his time to eat and chew foods. Endorses a good appetite; eating more fruits and vegetables, plant based diet. Constipation has improved. Sleeping well at night. He stopped Radicava in January. No recent infections. No recent hospitalizations. Receiving additional support from a non-profit 6 hours a week. They help with  stretching, ROM. Working on [employer]. ALS appointment in  May.    CODE STATUS: Full Code   PPS: 40%  HOSPICE ELIGIBILITY/DIAGNOSIS: TBD  ROS   General: NAD EYES: denies vision changes ENMT: denies dysphagia Cardiovascular: denies chest pain Pulmonary:cough, denies increased SOB Abdomen: endorses fair appetite, occasional constipation, bm today GU: denies dysuria MSK: ROM limitations, no falls reported Skin: denies rashes or wounds Neurological: endorses weakness Psych: Endorses positive mood Heme/lymph/immuno: denies bruises, abnormal bleeding   Physical Exam: Pulse 76, resp 16, 130/72, sats 98% on room air Constitutional: NAD General: alert and oriented x 4, well groomed EYES: anicteric sclera, lids intact, no discharge  ENMT: intact hearing,oral mucous membranes moist CV: RRR, no LE edema Pulmonary: LCTA, no increased work of breathing, no cough, no audible wheezes, room air Abdomen: bowel sounds normoactive x 4 GU: deferred MSK: decreased ROM in all extremities, non ambulatory Skin: warm and dry, no rashes or wounds on visible skin Neuro: Generalized weakness Psych: pleasant, non anxious affect  Hem/lymph/immuno: no widespread bruising   PAST MEDICAL HISTORY:  Past Medical History:  Diagnosis Date  . ALS (amyotrophic lateral sclerosis) ([place])   . COVID-19 virus infection 06/2020  . Meningitis 2003   spinal  . Morbid obesity ([place] [facility] [place])   . Prediabetes 11/05/2016   A1C 5.7 on 11/05/16    SOCIAL HX:  Social History   Tobacco Use  . Smoking status: Never Smoker  . Smokeless tobacco: Never Used  Substance Use Topics  . Alcohol use: No    Alcohol/week: 0.0 standard drinks   FAMILY HX:  Family History  Problem Relation Age of Onset  . Diabetes Father   . Diabetes Mother   . Diabetes Sister   . Hypertension Sister   . Diabetes Brother   . Diabetes Sister   . Fibromyalgia Sister   . Diabetes Sister     ALLERGIES:  Allergies  Allergen Reactions  . Crab [Shellfish Allergy] Rash  . Other Rash     PERTINENT  MEDICATIONS:  Outpatient Encounter Medications as of 09/05/2020  Medication Sig  . Black Elderberry (SAMBUCUS ELDERBERRY) 50 MG/5ML SYRP Take by mouth.  . EPINEPHrine (EPIPEN 2-PAK) 0.3 mg/0.3 mL IJ SOAJ injection Inject 0.3 mLs (0.3 mg total) into the muscle as needed for anaphylaxis.  . Magnesium 200 MG CHEW Chew by mouth.  . Potassium 99 MG TABS Take by mouth.  Marland Kitchen VITAMIN D-VITAMIN K PO Take by mouth. Liquid drop   No facility-administered encounter medications on file as of 09/05/2020.     Thank you for the opportunity to participate in the care of Mr. Blansett. The palliative care team will continue to follow. Please call our office at [phone] if we can be of additional assistance.  Ezekiel Slocumb, NP,  AGPCNP-C

## 2020-09-08 ENCOUNTER — Other Ambulatory Visit: Payer: Self-pay | Admitting: Family Medicine

## 2020-09-08 DIAGNOSIS — T783XXA Angioneurotic edema, initial encounter: Secondary | ICD-10-CM

## 2020-09-09 ENCOUNTER — Ambulatory Visit: Payer: Medicare Other

## 2020-09-10 ENCOUNTER — Telehealth: Payer: Self-pay | Admitting: Family Medicine

## 2020-09-10 DIAGNOSIS — G1221 Amyotrophic lateral sclerosis: Secondary | ICD-10-CM | POA: Diagnosis not present

## 2020-09-10 DIAGNOSIS — R7303 Prediabetes: Secondary | ICD-10-CM | POA: Diagnosis not present

## 2020-09-10 DIAGNOSIS — Z8616 Personal history of COVID-19: Secondary | ICD-10-CM | POA: Diagnosis not present

## 2020-09-10 DIAGNOSIS — Z79899 Other long term (current) drug therapy: Secondary | ICD-10-CM | POA: Diagnosis not present

## 2020-09-10 NOTE — Telephone Encounter (Signed)
Copied from [place] [phone]. Topic: Quick Communication - Home Health Verbal Orders >> Sep 10, 2020 10:05 AM Jodie Echevaria wrote: Caller/Agency: [facility] // [place] Number: [id number] ok to LM  Requesting OT/PT/Skilled Nursing/Social Work/Speech Therapy: PT Frequency: once week for 9 weeks

## 2020-09-10 NOTE — Telephone Encounter (Signed)
Left verbal message on voicemail ok for services.

## 2020-09-11 DIAGNOSIS — Z8616 Personal history of COVID-19: Secondary | ICD-10-CM | POA: Diagnosis not present

## 2020-09-11 DIAGNOSIS — G1221 Amyotrophic lateral sclerosis: Secondary | ICD-10-CM | POA: Diagnosis not present

## 2020-09-11 DIAGNOSIS — R7303 Prediabetes: Secondary | ICD-10-CM | POA: Diagnosis not present

## 2020-09-11 DIAGNOSIS — Z79899 Other long term (current) drug therapy: Secondary | ICD-10-CM | POA: Diagnosis not present

## 2020-09-13 DIAGNOSIS — G1221 Amyotrophic lateral sclerosis: Secondary | ICD-10-CM | POA: Diagnosis not present

## 2020-09-19 DIAGNOSIS — Z79899 Other long term (current) drug therapy: Secondary | ICD-10-CM | POA: Diagnosis not present

## 2020-09-19 DIAGNOSIS — G1221 Amyotrophic lateral sclerosis: Secondary | ICD-10-CM | POA: Diagnosis not present

## 2020-09-19 DIAGNOSIS — Z8616 Personal history of COVID-19: Secondary | ICD-10-CM | POA: Diagnosis not present

## 2020-09-19 DIAGNOSIS — R7303 Prediabetes: Secondary | ICD-10-CM | POA: Diagnosis not present

## 2020-09-24 DIAGNOSIS — R7303 Prediabetes: Secondary | ICD-10-CM | POA: Diagnosis not present

## 2020-09-24 DIAGNOSIS — Z8616 Personal history of COVID-19: Secondary | ICD-10-CM | POA: Diagnosis not present

## 2020-09-24 DIAGNOSIS — G1221 Amyotrophic lateral sclerosis: Secondary | ICD-10-CM | POA: Diagnosis not present

## 2020-09-24 DIAGNOSIS — Z79899 Other long term (current) drug therapy: Secondary | ICD-10-CM | POA: Diagnosis not present

## 2020-09-26 DIAGNOSIS — G1221 Amyotrophic lateral sclerosis: Secondary | ICD-10-CM | POA: Diagnosis not present

## 2020-09-26 DIAGNOSIS — Z79899 Other long term (current) drug therapy: Secondary | ICD-10-CM | POA: Diagnosis not present

## 2020-09-26 DIAGNOSIS — Z8616 Personal history of COVID-19: Secondary | ICD-10-CM | POA: Diagnosis not present

## 2020-09-26 DIAGNOSIS — R7303 Prediabetes: Secondary | ICD-10-CM | POA: Diagnosis not present

## 2020-09-29 ENCOUNTER — Telehealth: Payer: Self-pay | Admitting: *Deleted

## 2020-09-29 ENCOUNTER — Telehealth: Payer: Self-pay | Admitting: Family Medicine

## 2020-09-29 NOTE — Telephone Encounter (Addendum)
   09/29/2020  Jerry [name] 1970/08/24 [id number]  Phone call to patient's spouse to discuss POA process and support needs. Per patient's spouse, they are currently working with an attorney to complete [place] process due to limitation in patient's speech. She would like to be able to speak on his behalf when medically needed which would also involve signed consent forms for that particular entity. Continued follow up with the attorney recommended to complete this process. Patient also active with Palliative Care, message left with Palliative care social worker to reach out to patient and spouse with any additional information regarding the process as well.   Elliot Gurney, [place] Administrator, [facility] Care Management [phone]

## 2020-09-29 NOTE — Telephone Encounter (Signed)
Pt's wife Winfield Caba Wife [phone]  Is calling to see if Social work can assist her and the pt to fill out POA paperwork - Since the patient's speech is declining. Not sure if would need paperwork from Dr. Ancil Boozer. Please advise

## 2020-09-29 NOTE — Telephone Encounter (Signed)
Pts wife Tylek Boney [phone] is calling to request orders for home health care personal care services to [facility] . Please advise

## 2020-09-30 NOTE — Telephone Encounter (Signed)
Pt scheduled appt for this coming Friday. Fredda Hammed (wife) would like to know if this appt can be virtual. She stated that she is not able to get him down the stairs as his walking has gotten worse. His last in office visit was sept 21 and his last virtual visit was Dec 15. Please advise

## 2020-10-01 ENCOUNTER — Telehealth: Payer: Self-pay

## 2020-10-01 DIAGNOSIS — R7303 Prediabetes: Secondary | ICD-10-CM | POA: Diagnosis not present

## 2020-10-01 DIAGNOSIS — Z79899 Other long term (current) drug therapy: Secondary | ICD-10-CM | POA: Diagnosis not present

## 2020-10-01 DIAGNOSIS — Z8616 Personal history of COVID-19: Secondary | ICD-10-CM | POA: Diagnosis not present

## 2020-10-01 DIAGNOSIS — G1221 Amyotrophic lateral sclerosis: Secondary | ICD-10-CM | POA: Diagnosis not present

## 2020-10-01 NOTE — Telephone Encounter (Signed)
In order to use the transportation provided by [name] a pt has to apply for these services.

## 2020-10-01 NOTE — Telephone Encounter (Signed)
(  4:14p) Palliative care left a message with patient's wife-[name] requesting a return call to asssita with POA paperwork.

## 2020-10-02 DIAGNOSIS — R262 Difficulty in walking, not elsewhere classified: Secondary | ICD-10-CM | POA: Diagnosis not present

## 2020-10-02 DIAGNOSIS — R531 Weakness: Secondary | ICD-10-CM | POA: Diagnosis not present

## 2020-10-02 DIAGNOSIS — M6281 Muscle weakness (generalized): Secondary | ICD-10-CM | POA: Diagnosis not present

## 2020-10-02 DIAGNOSIS — R2689 Other abnormalities of gait and mobility: Secondary | ICD-10-CM | POA: Diagnosis not present

## 2020-10-02 NOTE — Progress Notes (Signed)
Name: Jerry [name]   MRN: [id number]    DOB: [birth year]   Date:10/03/2020       Progress Note  Subjective  Chief Complaint  Home Health Orders needed   I connected with  Jerry [name]  on 10/03/20 at  3:40 PM EDT by a video enabled telemedicine application and verified that I am speaking with the correct person using two identifiers.  I discussed the limitations of evaluation and management by telemedicine and the availability of in person appointments. The patient expressed understanding and agreed to proceed with the virtual visit  Staff also discussed with the patient that there may be a patient responsible charge related to this service. Patient Location: home  Provider Location: [facility] Additional Individuals present: wife   HPI  ALS: he had symptoms of myoclonus and weakness for months prior to his diagnosis of ALS Summer of 2020 , he is getting progressively weaker. He is no longer able to stand without supervision, he is unable to fee himself, currently sleeping on a recliner ( has a hospital bed but they are trying to finish screen room prior to let him go to main floor), he also needs assistance transferring, bathing and more recently neck muscle weakness and dysarthria. His wife has been doing most of the care, but is getting tired, she still works and has to provide for their children. He cannot be at home unsupervised. They asked for home health ( currently only getting PT and ST once a week) and family helps with ROM exercises. Discussed he would likely benefit from having [facility] [place]. We will fill out forms today.    Patient Active Problem List   Diagnosis Date Noted  . ALS (amyotrophic lateral sclerosis) ([facility]) 01/11/2019  . Fasciculations of muscle 09/08/2018  . Thenar muscle atrophy of left hand 09/08/2018  . Prediabetes 11/05/2016  . Family history of diabetes mellitus 11/04/2016    Past Surgical History:  Procedure Laterality Date  . MASS EXCISION Right  11/26/2014   Procedure: EXCISION MASS/GROIN EXCISION MASS;  Surgeon: Robert Bellow, MD;  Location: [facility] ORS;  Service: General;  Laterality: Right;  . MASS EXCISION Right 11/26/2014   Procedure: MINOR EXCISION OF MASS/POST THIGH MASS;  Surgeon: Robert Bellow, MD;  Location: [facility] ORS;  Service: General;  Laterality: Right;  . PORTACATH PLACEMENT Right 06/19/2019    Family History  Problem Relation Age of Onset  . Diabetes Father   . Diabetes Mother   . Diabetes Sister   . Hypertension Sister   . Diabetes Brother   . Diabetes Sister   . Fibromyalgia Sister   . Diabetes Sister     Social History   Socioeconomic History  . Marital status: Married    Spouse name: [name]  . Number of children: 3  . Years of education: Not on file  . Highest education level: High school graduate  Occupational History  . Occupation: Mining engineer    Comment: self employed  . Occupation: Ship broker    Comment: [employer]  Tobacco Use  . Smoking status: Never Smoker  . Smokeless tobacco: Never Used  Vaping Use  . Vaping Use: Never used  Substance and Sexual Activity  . Alcohol use: No    Alcohol/week: 0.0 standard drinks  . Drug use: No  . Sexual activity: Yes    Partners: Female  Other Topics Concern  . Not on file  Social History Narrative   Lives with wife and 3 children   Approved  for disability 02/2019 for ALS    Social Determinants of Health   Financial Resource Strain: Low Risk   . Difficulty of Paying Living Expenses: Not very hard  Food Insecurity: No Food Insecurity  . Worried About Charity fundraiser in the Last Year: Never true  . Ran Out of Food in the Last Year: Never true  Transportation Needs: No Transportation Needs  . Lack of Transportation (Medical): No  . Lack of Transportation (Non-Medical): No  Physical Activity: Inactive  . Days of Exercise per Week: 0 days  . Minutes of Exercise per Session: 0 min  Stress: No Stress Concern Present  . Feeling of  Stress : Not at all  Social Connections: Moderately Isolated  . Frequency of Communication with Friends and Family: More than three times a week  . Frequency of Social Gatherings with Friends and Family: More than three times a week  . Attends Religious Services: Never  . Active Member of Clubs or Organizations: No  . Attends Archivist Meetings: Never  . Marital Status: Married  Human resources officer Violence: Not At Risk  . Fear of Current or Ex-Partner: No  . Emotionally Abused: No  . Physically Abused: No  . Sexually Abused: No     Current Outpatient Medications:  .  Black Elderberry (SAMBUCUS ELDERBERRY) 50 MG/5ML SYRP, Take by mouth., Disp: , Rfl:  .  EPINEPHrine (EPIPEN 2-PAK) 0.3 mg/0.3 mL IJ SOAJ injection, Inject 0.3 mLs (0.3 mg total) into the muscle as needed for anaphylaxis., Disp: 1 each, Rfl: 0 .  Magnesium 200 MG CHEW, Chew by mouth., Disp: , Rfl:  .  Potassium 99 MG TABS, Take by mouth., Disp: , Rfl:  .  VITAMIN D-VITAMIN K PO, Take by mouth. Liquid drop, Disp: , Rfl:   Allergies  Allergen Reactions  . Crab [Shellfish Allergy] Rash  . Other Rash    I personally reviewed active problem list, medication list, allergies, family history, social history with the patient/caregiver today.   ROS  Ten systems reviewed and is negative except as mentioned in HPI   Objective  Virtual encounter, vitals not obtained.  Body mass index is 30.12 kg/m.  Physical Exam  Awake, alert and oriented, wife was next to him and gave most of the history, he is able to wiggle some of his fingers, moves his neck, has dysarthria , able to raise his shoulders  PHQ2/9: Depression screen [facility] 2/9 10/03/2020 09/04/2020 06/18/2020 06/09/2020 03/21/2020  Decreased Interest 0 0 0 0 0  Down, Depressed, Hopeless 0 0 0 0 0  PHQ - 2 Score 0 0 0 0 0  Altered sleeping - - - - -  Tired, decreased energy - - - - -  Change in appetite - - - - -  Feeling bad or failure about yourself  - - - - -   Trouble concentrating - - - - -  Moving slowly or fidgety/restless - - - - -  Suicidal thoughts - - - - -  PHQ-9 Score - - - - -  Difficult doing work/chores - - - - -   PHQ-2/9 Result is negative.    Fall Risk: Fall Risk  10/03/2020 09/04/2020 06/18/2020 06/09/2020 03/21/2020  Falls in the past year? 0 0 0 0 1  Number falls in past yr: 0 0 0 0 1  Comment - - - - -  Injury with Fall? 0 0 0 0 0  Comment - - - - -  Risk  for fall due to : Impaired balance/gait;Impaired mobility Impaired balance/gait;Impaired mobility Impaired mobility - Impaired mobility;History of fall(s)  Risk for fall due to: Comment - - - - -  Follow up Falls prevention discussed Falls prevention discussed - - -     Assessment & Plan  1. ALS (amyotrophic lateral sclerosis) ([facility])  Filled out PCS forms, reminded him importance of taking deep breaths, rotating position to avoid bed sores    I discussed the assessment and treatment plan with the patient. The patient was provided an opportunity to ask questions and all were answered. The patient agreed with the plan and demonstrated an understanding of the instructions.  The patient was advised to call back or seek an in-person evaluation if the symptoms worsen or if the condition fails to improve as anticipated.  I provided 25  minutes of non-face-to-face time during this encounter.

## 2020-10-02 NOTE — Telephone Encounter (Signed)
Pt informed that he will need to do a in office appt on his next visit

## 2020-10-03 ENCOUNTER — Encounter: Payer: Self-pay | Admitting: Family Medicine

## 2020-10-03 ENCOUNTER — Telehealth (INDEPENDENT_AMBULATORY_CARE_PROVIDER_SITE_OTHER): Payer: Medicare HMO | Admitting: Family Medicine

## 2020-10-03 VITALS — Ht 65.0 in | Wt 181.0 lb

## 2020-10-03 DIAGNOSIS — G1221 Amyotrophic lateral sclerosis: Secondary | ICD-10-CM | POA: Diagnosis not present

## 2020-10-07 DIAGNOSIS — G1221 Amyotrophic lateral sclerosis: Secondary | ICD-10-CM | POA: Diagnosis not present

## 2020-10-07 DIAGNOSIS — R7303 Prediabetes: Secondary | ICD-10-CM | POA: Diagnosis not present

## 2020-10-07 DIAGNOSIS — Z8616 Personal history of COVID-19: Secondary | ICD-10-CM | POA: Diagnosis not present

## 2020-10-07 DIAGNOSIS — Z79899 Other long term (current) drug therapy: Secondary | ICD-10-CM | POA: Diagnosis not present

## 2020-10-08 DIAGNOSIS — R7303 Prediabetes: Secondary | ICD-10-CM | POA: Diagnosis not present

## 2020-10-08 DIAGNOSIS — Z79899 Other long term (current) drug therapy: Secondary | ICD-10-CM | POA: Diagnosis not present

## 2020-10-08 DIAGNOSIS — Z8616 Personal history of COVID-19: Secondary | ICD-10-CM | POA: Diagnosis not present

## 2020-10-08 DIAGNOSIS — G1221 Amyotrophic lateral sclerosis: Secondary | ICD-10-CM | POA: Diagnosis not present

## 2020-10-14 DIAGNOSIS — G1221 Amyotrophic lateral sclerosis: Secondary | ICD-10-CM | POA: Diagnosis not present

## 2020-10-15 DIAGNOSIS — Z79899 Other long term (current) drug therapy: Secondary | ICD-10-CM | POA: Diagnosis not present

## 2020-10-15 DIAGNOSIS — R7303 Prediabetes: Secondary | ICD-10-CM | POA: Diagnosis not present

## 2020-10-15 DIAGNOSIS — Z8616 Personal history of COVID-19: Secondary | ICD-10-CM | POA: Diagnosis not present

## 2020-10-15 DIAGNOSIS — G1221 Amyotrophic lateral sclerosis: Secondary | ICD-10-CM | POA: Diagnosis not present

## 2020-10-16 DIAGNOSIS — G1221 Amyotrophic lateral sclerosis: Secondary | ICD-10-CM | POA: Diagnosis not present

## 2020-10-16 DIAGNOSIS — Z8616 Personal history of COVID-19: Secondary | ICD-10-CM | POA: Diagnosis not present

## 2020-10-16 DIAGNOSIS — R7303 Prediabetes: Secondary | ICD-10-CM | POA: Diagnosis not present

## 2020-10-16 DIAGNOSIS — Z79899 Other long term (current) drug therapy: Secondary | ICD-10-CM | POA: Diagnosis not present

## 2020-10-22 DIAGNOSIS — Z79899 Other long term (current) drug therapy: Secondary | ICD-10-CM | POA: Diagnosis not present

## 2020-10-22 DIAGNOSIS — Z515 Encounter for palliative care: Secondary | ICD-10-CM | POA: Diagnosis not present

## 2020-10-22 DIAGNOSIS — Z8616 Personal history of COVID-19: Secondary | ICD-10-CM | POA: Diagnosis not present

## 2020-10-22 DIAGNOSIS — K5900 Constipation, unspecified: Secondary | ICD-10-CM | POA: Diagnosis not present

## 2020-10-22 DIAGNOSIS — R7303 Prediabetes: Secondary | ICD-10-CM | POA: Diagnosis not present

## 2020-10-22 DIAGNOSIS — G1221 Amyotrophic lateral sclerosis: Secondary | ICD-10-CM | POA: Diagnosis not present

## 2020-10-22 DIAGNOSIS — R1310 Dysphagia, unspecified: Secondary | ICD-10-CM | POA: Diagnosis not present

## 2020-10-30 DIAGNOSIS — G1221 Amyotrophic lateral sclerosis: Secondary | ICD-10-CM | POA: Diagnosis not present

## 2020-10-30 DIAGNOSIS — R7303 Prediabetes: Secondary | ICD-10-CM | POA: Diagnosis not present

## 2020-10-30 DIAGNOSIS — Z79899 Other long term (current) drug therapy: Secondary | ICD-10-CM | POA: Diagnosis not present

## 2020-10-30 DIAGNOSIS — Z8616 Personal history of COVID-19: Secondary | ICD-10-CM | POA: Diagnosis not present

## 2020-11-02 DIAGNOSIS — R262 Difficulty in walking, not elsewhere classified: Secondary | ICD-10-CM | POA: Diagnosis not present

## 2020-11-02 DIAGNOSIS — R531 Weakness: Secondary | ICD-10-CM | POA: Diagnosis not present

## 2020-11-02 DIAGNOSIS — R2689 Other abnormalities of gait and mobility: Secondary | ICD-10-CM | POA: Diagnosis not present

## 2020-11-02 DIAGNOSIS — M6281 Muscle weakness (generalized): Secondary | ICD-10-CM | POA: Diagnosis not present

## 2020-11-06 ENCOUNTER — Telehealth: Payer: Self-pay

## 2020-11-06 ENCOUNTER — Telehealth: Payer: Self-pay | Admitting: Family Medicine

## 2020-11-06 DIAGNOSIS — Z8616 Personal history of COVID-19: Secondary | ICD-10-CM | POA: Diagnosis not present

## 2020-11-06 DIAGNOSIS — G1221 Amyotrophic lateral sclerosis: Secondary | ICD-10-CM | POA: Diagnosis not present

## 2020-11-06 DIAGNOSIS — Z79899 Other long term (current) drug therapy: Secondary | ICD-10-CM | POA: Diagnosis not present

## 2020-11-06 DIAGNOSIS — R7303 Prediabetes: Secondary | ICD-10-CM | POA: Diagnosis not present

## 2020-11-06 NOTE — Telephone Encounter (Unsigned)
Copied from [place] [phone]. Topic: Quick Communication - Home Health Verbal Orders >> Nov 06, 2020  4:51 PM Yvette Rack wrote: Caller/Agency: [facility] Number: [phone] Requesting OT/PT/Skilled Nursing/Social Work/Speech Therapy: PT Frequency: 1 time a week for 9 weeks

## 2020-11-06 NOTE — Telephone Encounter (Signed)
LVM to give verbal orders per Dr. Ancil Boozer for patient to continue speech therapy 1 per week for 8 weeks.

## 2020-11-06 NOTE — Telephone Encounter (Signed)
Copied from [place] [phone]. Topic: Quick Communication - Home Health Verbal Orders >> Nov 06, 2020 12:09 PM [place], Brayton Layman A wrote: Caller/Agency: [facility] Callback Number: [phone] Requesting: to continue Speech Therapy Frequency: 1x8

## 2020-11-07 NOTE — Telephone Encounter (Signed)
Completed.

## 2020-11-14 DIAGNOSIS — R7303 Prediabetes: Secondary | ICD-10-CM | POA: Diagnosis not present

## 2020-11-14 DIAGNOSIS — G1221 Amyotrophic lateral sclerosis: Secondary | ICD-10-CM | POA: Diagnosis not present

## 2020-11-14 DIAGNOSIS — Z993 Dependence on wheelchair: Secondary | ICD-10-CM | POA: Diagnosis not present

## 2020-11-14 DIAGNOSIS — Z79899 Other long term (current) drug therapy: Secondary | ICD-10-CM | POA: Diagnosis not present

## 2020-11-14 DIAGNOSIS — Z8616 Personal history of COVID-19: Secondary | ICD-10-CM | POA: Diagnosis not present

## 2020-11-19 ENCOUNTER — Telehealth: Payer: Self-pay

## 2020-11-19 DIAGNOSIS — R1310 Dysphagia, unspecified: Secondary | ICD-10-CM | POA: Diagnosis not present

## 2020-11-19 DIAGNOSIS — G8250 Quadriplegia, unspecified: Secondary | ICD-10-CM | POA: Diagnosis not present

## 2020-11-19 DIAGNOSIS — Z993 Dependence on wheelchair: Secondary | ICD-10-CM | POA: Diagnosis not present

## 2020-11-19 DIAGNOSIS — G1221 Amyotrophic lateral sclerosis: Secondary | ICD-10-CM | POA: Diagnosis not present

## 2020-11-19 DIAGNOSIS — R471 Dysarthria and anarthria: Secondary | ICD-10-CM | POA: Diagnosis not present

## 2020-11-19 NOTE — Telephone Encounter (Signed)
Faxed

## 2020-11-19 NOTE — Telephone Encounter (Signed)
Copied from [place] [phone]. Topic: General - Other >> Nov 18, 2020  5:20 PM Loma Boston wrote: Source Subject Topic Copied from [place] for clarification on phone #    Listed Number is Fax #  Note to be faxed/pt needs by 8:30 am, time 5:20 pm Jerry [name] (Patient) Jerry [name] (Patient) General - Other Summary: TIME sensitive ALS pt needs clearance note to have wife ride with him for early am 5-18 appt Pt needs transportation to an appointment at [facility] at 10:15, departure 8:30 AM and Pt wife Fredda Hammed his wife needs to accompany him. The [employer] bus that is picking him up needs a note from his PCP to allow wife to ride and assist. [phone]. Transportation confirmed for him but not her. Need ASAP or he will not have assistance to get to appt

## 2020-11-19 NOTE — Telephone Encounter (Unsigned)
Copied from [place] [phone]. Topic: General - Other >> Nov 18, 2020  5:03 PM Loma Boston wrote: Pt needs transportation to an appointment at [facility] at 10:15, departure 8:30 AM and Pt wife Jerry [name] his wife needs to accompany him. The [employer] bus that is picking him up needs a note from his PCP to allow wife to ride and assist.  [phone]. Transportation confirmed for him but not her. Need ASAP or he will not have assistance to get to appt

## 2020-11-21 DIAGNOSIS — R7303 Prediabetes: Secondary | ICD-10-CM | POA: Diagnosis not present

## 2020-11-21 DIAGNOSIS — G1221 Amyotrophic lateral sclerosis: Secondary | ICD-10-CM | POA: Diagnosis not present

## 2020-11-21 DIAGNOSIS — Z993 Dependence on wheelchair: Secondary | ICD-10-CM | POA: Diagnosis not present

## 2020-11-21 DIAGNOSIS — Z8616 Personal history of COVID-19: Secondary | ICD-10-CM | POA: Diagnosis not present

## 2020-11-21 DIAGNOSIS — Z79899 Other long term (current) drug therapy: Secondary | ICD-10-CM | POA: Diagnosis not present

## 2020-11-27 DIAGNOSIS — Z8616 Personal history of COVID-19: Secondary | ICD-10-CM | POA: Diagnosis not present

## 2020-11-27 DIAGNOSIS — R7303 Prediabetes: Secondary | ICD-10-CM | POA: Diagnosis not present

## 2020-11-27 DIAGNOSIS — G1221 Amyotrophic lateral sclerosis: Secondary | ICD-10-CM | POA: Diagnosis not present

## 2020-11-27 DIAGNOSIS — Z79899 Other long term (current) drug therapy: Secondary | ICD-10-CM | POA: Diagnosis not present

## 2020-11-27 DIAGNOSIS — Z993 Dependence on wheelchair: Secondary | ICD-10-CM | POA: Diagnosis not present

## 2020-12-03 DIAGNOSIS — G1221 Amyotrophic lateral sclerosis: Secondary | ICD-10-CM | POA: Diagnosis not present

## 2020-12-04 DIAGNOSIS — G1221 Amyotrophic lateral sclerosis: Secondary | ICD-10-CM | POA: Diagnosis not present

## 2020-12-04 DIAGNOSIS — Z993 Dependence on wheelchair: Secondary | ICD-10-CM | POA: Diagnosis not present

## 2020-12-04 DIAGNOSIS — Z79899 Other long term (current) drug therapy: Secondary | ICD-10-CM | POA: Diagnosis not present

## 2020-12-04 DIAGNOSIS — Z8616 Personal history of COVID-19: Secondary | ICD-10-CM | POA: Diagnosis not present

## 2020-12-04 DIAGNOSIS — R7303 Prediabetes: Secondary | ICD-10-CM | POA: Diagnosis not present

## 2020-12-11 DIAGNOSIS — Z993 Dependence on wheelchair: Secondary | ICD-10-CM | POA: Diagnosis not present

## 2020-12-11 DIAGNOSIS — Z79899 Other long term (current) drug therapy: Secondary | ICD-10-CM | POA: Diagnosis not present

## 2020-12-11 DIAGNOSIS — Z8616 Personal history of COVID-19: Secondary | ICD-10-CM | POA: Diagnosis not present

## 2020-12-11 DIAGNOSIS — G1221 Amyotrophic lateral sclerosis: Secondary | ICD-10-CM | POA: Diagnosis not present

## 2020-12-11 DIAGNOSIS — R7303 Prediabetes: Secondary | ICD-10-CM | POA: Diagnosis not present

## 2020-12-17 DIAGNOSIS — Z993 Dependence on wheelchair: Secondary | ICD-10-CM | POA: Diagnosis not present

## 2020-12-17 DIAGNOSIS — Z8616 Personal history of COVID-19: Secondary | ICD-10-CM | POA: Diagnosis not present

## 2020-12-17 DIAGNOSIS — Z79899 Other long term (current) drug therapy: Secondary | ICD-10-CM | POA: Diagnosis not present

## 2020-12-17 DIAGNOSIS — R7303 Prediabetes: Secondary | ICD-10-CM | POA: Diagnosis not present

## 2020-12-17 DIAGNOSIS — G1221 Amyotrophic lateral sclerosis: Secondary | ICD-10-CM | POA: Diagnosis not present

## 2020-12-20 DIAGNOSIS — R7303 Prediabetes: Secondary | ICD-10-CM | POA: Diagnosis not present

## 2020-12-20 DIAGNOSIS — Z79899 Other long term (current) drug therapy: Secondary | ICD-10-CM | POA: Diagnosis not present

## 2020-12-20 DIAGNOSIS — Z8616 Personal history of COVID-19: Secondary | ICD-10-CM | POA: Diagnosis not present

## 2020-12-20 DIAGNOSIS — Z993 Dependence on wheelchair: Secondary | ICD-10-CM | POA: Diagnosis not present

## 2020-12-20 DIAGNOSIS — G1221 Amyotrophic lateral sclerosis: Secondary | ICD-10-CM | POA: Diagnosis not present

## 2020-12-25 DIAGNOSIS — G1221 Amyotrophic lateral sclerosis: Secondary | ICD-10-CM | POA: Diagnosis not present

## 2020-12-25 DIAGNOSIS — R7303 Prediabetes: Secondary | ICD-10-CM | POA: Diagnosis not present

## 2020-12-25 DIAGNOSIS — Z8616 Personal history of COVID-19: Secondary | ICD-10-CM | POA: Diagnosis not present

## 2020-12-25 DIAGNOSIS — Z79899 Other long term (current) drug therapy: Secondary | ICD-10-CM | POA: Diagnosis not present

## 2020-12-25 DIAGNOSIS — Z993 Dependence on wheelchair: Secondary | ICD-10-CM | POA: Diagnosis not present

## 2020-12-26 ENCOUNTER — Telehealth: Payer: Self-pay

## 2020-12-26 DIAGNOSIS — G1221 Amyotrophic lateral sclerosis: Secondary | ICD-10-CM | POA: Diagnosis not present

## 2020-12-26 DIAGNOSIS — Z993 Dependence on wheelchair: Secondary | ICD-10-CM | POA: Diagnosis not present

## 2020-12-26 DIAGNOSIS — Z8616 Personal history of COVID-19: Secondary | ICD-10-CM | POA: Diagnosis not present

## 2020-12-26 DIAGNOSIS — R7303 Prediabetes: Secondary | ICD-10-CM | POA: Diagnosis not present

## 2020-12-26 DIAGNOSIS — Z79899 Other long term (current) drug therapy: Secondary | ICD-10-CM | POA: Diagnosis not present

## 2020-12-26 NOTE — Telephone Encounter (Signed)
Copied from [place] [phone]. Topic: Quick Communication - Home Health Verbal Orders >> Dec 26, 2020  8:51 AM Tessa Lerner A wrote: Caller/Agency: [facility]  Callback Number: [phone]  Requesting OT/PT/Skilled Nursing/Social Work/Speech Therapy: Speech Therapy   Frequency: 1w3

## 2020-12-29 DIAGNOSIS — G1221 Amyotrophic lateral sclerosis: Secondary | ICD-10-CM | POA: Diagnosis not present

## 2020-12-29 NOTE — Telephone Encounter (Signed)
Verbal orders given  

## 2020-12-31 DIAGNOSIS — Z8616 Personal history of COVID-19: Secondary | ICD-10-CM | POA: Diagnosis not present

## 2020-12-31 DIAGNOSIS — Z993 Dependence on wheelchair: Secondary | ICD-10-CM | POA: Diagnosis not present

## 2020-12-31 DIAGNOSIS — G1221 Amyotrophic lateral sclerosis: Secondary | ICD-10-CM | POA: Diagnosis not present

## 2020-12-31 DIAGNOSIS — R7303 Prediabetes: Secondary | ICD-10-CM | POA: Diagnosis not present

## 2020-12-31 DIAGNOSIS — Z79899 Other long term (current) drug therapy: Secondary | ICD-10-CM | POA: Diagnosis not present

## 2021-01-01 DIAGNOSIS — R531 Weakness: Secondary | ICD-10-CM | POA: Diagnosis not present

## 2021-01-01 DIAGNOSIS — G1221 Amyotrophic lateral sclerosis: Secondary | ICD-10-CM | POA: Diagnosis not present

## 2021-01-01 DIAGNOSIS — Z8616 Personal history of COVID-19: Secondary | ICD-10-CM | POA: Diagnosis not present

## 2021-01-01 DIAGNOSIS — R2689 Other abnormalities of gait and mobility: Secondary | ICD-10-CM | POA: Diagnosis not present

## 2021-01-01 DIAGNOSIS — Z79899 Other long term (current) drug therapy: Secondary | ICD-10-CM | POA: Diagnosis not present

## 2021-01-01 DIAGNOSIS — R262 Difficulty in walking, not elsewhere classified: Secondary | ICD-10-CM | POA: Diagnosis not present

## 2021-01-01 DIAGNOSIS — M6281 Muscle weakness (generalized): Secondary | ICD-10-CM | POA: Diagnosis not present

## 2021-01-01 DIAGNOSIS — R7303 Prediabetes: Secondary | ICD-10-CM | POA: Diagnosis not present

## 2021-01-01 DIAGNOSIS — Z993 Dependence on wheelchair: Secondary | ICD-10-CM | POA: Diagnosis not present

## 2021-01-03 DIAGNOSIS — G1221 Amyotrophic lateral sclerosis: Secondary | ICD-10-CM | POA: Diagnosis not present

## 2021-01-07 DIAGNOSIS — Z79899 Other long term (current) drug therapy: Secondary | ICD-10-CM | POA: Diagnosis not present

## 2021-01-07 DIAGNOSIS — Z8616 Personal history of COVID-19: Secondary | ICD-10-CM | POA: Diagnosis not present

## 2021-01-07 DIAGNOSIS — G1221 Amyotrophic lateral sclerosis: Secondary | ICD-10-CM | POA: Diagnosis not present

## 2021-01-07 DIAGNOSIS — Z993 Dependence on wheelchair: Secondary | ICD-10-CM | POA: Diagnosis not present

## 2021-01-07 DIAGNOSIS — R7303 Prediabetes: Secondary | ICD-10-CM | POA: Diagnosis not present

## 2021-01-08 DIAGNOSIS — G1221 Amyotrophic lateral sclerosis: Secondary | ICD-10-CM | POA: Diagnosis not present

## 2021-01-08 DIAGNOSIS — R7303 Prediabetes: Secondary | ICD-10-CM | POA: Diagnosis not present

## 2021-01-08 DIAGNOSIS — Z8616 Personal history of COVID-19: Secondary | ICD-10-CM | POA: Diagnosis not present

## 2021-01-08 DIAGNOSIS — Z79899 Other long term (current) drug therapy: Secondary | ICD-10-CM | POA: Diagnosis not present

## 2021-01-08 DIAGNOSIS — Z993 Dependence on wheelchair: Secondary | ICD-10-CM | POA: Diagnosis not present

## 2021-01-13 DIAGNOSIS — G1221 Amyotrophic lateral sclerosis: Secondary | ICD-10-CM | POA: Diagnosis not present

## 2021-01-15 DIAGNOSIS — R7303 Prediabetes: Secondary | ICD-10-CM | POA: Diagnosis not present

## 2021-01-15 DIAGNOSIS — Z79899 Other long term (current) drug therapy: Secondary | ICD-10-CM | POA: Diagnosis not present

## 2021-01-15 DIAGNOSIS — Z993 Dependence on wheelchair: Secondary | ICD-10-CM | POA: Diagnosis not present

## 2021-01-15 DIAGNOSIS — Z8616 Personal history of COVID-19: Secondary | ICD-10-CM | POA: Diagnosis not present

## 2021-01-15 DIAGNOSIS — G1221 Amyotrophic lateral sclerosis: Secondary | ICD-10-CM | POA: Diagnosis not present

## 2021-01-22 DIAGNOSIS — Z993 Dependence on wheelchair: Secondary | ICD-10-CM | POA: Diagnosis not present

## 2021-01-22 DIAGNOSIS — G1221 Amyotrophic lateral sclerosis: Secondary | ICD-10-CM | POA: Diagnosis not present

## 2021-01-22 DIAGNOSIS — R7303 Prediabetes: Secondary | ICD-10-CM | POA: Diagnosis not present

## 2021-01-22 DIAGNOSIS — Z79899 Other long term (current) drug therapy: Secondary | ICD-10-CM | POA: Diagnosis not present

## 2021-01-22 DIAGNOSIS — Z8616 Personal history of COVID-19: Secondary | ICD-10-CM | POA: Diagnosis not present

## 2021-01-29 DIAGNOSIS — Z993 Dependence on wheelchair: Secondary | ICD-10-CM | POA: Diagnosis not present

## 2021-01-29 DIAGNOSIS — G1221 Amyotrophic lateral sclerosis: Secondary | ICD-10-CM | POA: Diagnosis not present

## 2021-01-29 DIAGNOSIS — Z79899 Other long term (current) drug therapy: Secondary | ICD-10-CM | POA: Diagnosis not present

## 2021-01-29 DIAGNOSIS — R7303 Prediabetes: Secondary | ICD-10-CM | POA: Diagnosis not present

## 2021-01-29 DIAGNOSIS — Z8616 Personal history of COVID-19: Secondary | ICD-10-CM | POA: Diagnosis not present

## 2021-02-01 DIAGNOSIS — M6281 Muscle weakness (generalized): Secondary | ICD-10-CM | POA: Diagnosis not present

## 2021-02-01 DIAGNOSIS — R531 Weakness: Secondary | ICD-10-CM | POA: Diagnosis not present

## 2021-02-01 DIAGNOSIS — R262 Difficulty in walking, not elsewhere classified: Secondary | ICD-10-CM | POA: Diagnosis not present

## 2021-02-01 DIAGNOSIS — R2689 Other abnormalities of gait and mobility: Secondary | ICD-10-CM | POA: Diagnosis not present

## 2021-02-03 DIAGNOSIS — G1221 Amyotrophic lateral sclerosis: Secondary | ICD-10-CM | POA: Diagnosis not present

## 2021-02-05 DIAGNOSIS — Z8616 Personal history of COVID-19: Secondary | ICD-10-CM | POA: Diagnosis not present

## 2021-02-05 DIAGNOSIS — Z79899 Other long term (current) drug therapy: Secondary | ICD-10-CM | POA: Diagnosis not present

## 2021-02-05 DIAGNOSIS — R7303 Prediabetes: Secondary | ICD-10-CM | POA: Diagnosis not present

## 2021-02-05 DIAGNOSIS — Z993 Dependence on wheelchair: Secondary | ICD-10-CM | POA: Diagnosis not present

## 2021-02-05 DIAGNOSIS — G1221 Amyotrophic lateral sclerosis: Secondary | ICD-10-CM | POA: Diagnosis not present

## 2021-02-12 DIAGNOSIS — R7303 Prediabetes: Secondary | ICD-10-CM | POA: Diagnosis not present

## 2021-02-12 DIAGNOSIS — Z993 Dependence on wheelchair: Secondary | ICD-10-CM | POA: Diagnosis not present

## 2021-02-12 DIAGNOSIS — G1221 Amyotrophic lateral sclerosis: Secondary | ICD-10-CM | POA: Diagnosis not present

## 2021-02-12 DIAGNOSIS — Z8616 Personal history of COVID-19: Secondary | ICD-10-CM | POA: Diagnosis not present

## 2021-02-12 DIAGNOSIS — Z79899 Other long term (current) drug therapy: Secondary | ICD-10-CM | POA: Diagnosis not present

## 2021-02-13 DIAGNOSIS — G1221 Amyotrophic lateral sclerosis: Secondary | ICD-10-CM | POA: Diagnosis not present

## 2021-02-19 DIAGNOSIS — Z79899 Other long term (current) drug therapy: Secondary | ICD-10-CM | POA: Diagnosis not present

## 2021-02-19 DIAGNOSIS — Z993 Dependence on wheelchair: Secondary | ICD-10-CM | POA: Diagnosis not present

## 2021-02-19 DIAGNOSIS — G1221 Amyotrophic lateral sclerosis: Secondary | ICD-10-CM | POA: Diagnosis not present

## 2021-02-19 DIAGNOSIS — Z8616 Personal history of COVID-19: Secondary | ICD-10-CM | POA: Diagnosis not present

## 2021-02-19 DIAGNOSIS — R7303 Prediabetes: Secondary | ICD-10-CM | POA: Diagnosis not present

## 2021-03-04 DIAGNOSIS — R262 Difficulty in walking, not elsewhere classified: Secondary | ICD-10-CM | POA: Diagnosis not present

## 2021-03-04 DIAGNOSIS — R531 Weakness: Secondary | ICD-10-CM | POA: Diagnosis not present

## 2021-03-04 DIAGNOSIS — M6281 Muscle weakness (generalized): Secondary | ICD-10-CM | POA: Diagnosis not present

## 2021-03-04 DIAGNOSIS — R2689 Other abnormalities of gait and mobility: Secondary | ICD-10-CM | POA: Diagnosis not present

## 2021-03-05 DIAGNOSIS — G1221 Amyotrophic lateral sclerosis: Secondary | ICD-10-CM | POA: Diagnosis not present

## 2021-03-06 DIAGNOSIS — G1221 Amyotrophic lateral sclerosis: Secondary | ICD-10-CM | POA: Diagnosis not present

## 2021-03-16 DIAGNOSIS — G1221 Amyotrophic lateral sclerosis: Secondary | ICD-10-CM | POA: Diagnosis not present

## 2021-04-03 DIAGNOSIS — R531 Weakness: Secondary | ICD-10-CM | POA: Diagnosis not present

## 2021-04-03 DIAGNOSIS — R262 Difficulty in walking, not elsewhere classified: Secondary | ICD-10-CM | POA: Diagnosis not present

## 2021-04-03 DIAGNOSIS — R2689 Other abnormalities of gait and mobility: Secondary | ICD-10-CM | POA: Diagnosis not present

## 2021-04-03 DIAGNOSIS — M6281 Muscle weakness (generalized): Secondary | ICD-10-CM | POA: Diagnosis not present

## 2021-04-05 DIAGNOSIS — G1221 Amyotrophic lateral sclerosis: Secondary | ICD-10-CM | POA: Diagnosis not present

## 2021-04-15 DIAGNOSIS — G1221 Amyotrophic lateral sclerosis: Secondary | ICD-10-CM | POA: Diagnosis not present

## 2021-05-04 DIAGNOSIS — R262 Difficulty in walking, not elsewhere classified: Secondary | ICD-10-CM | POA: Diagnosis not present

## 2021-05-04 DIAGNOSIS — R531 Weakness: Secondary | ICD-10-CM | POA: Diagnosis not present

## 2021-05-04 DIAGNOSIS — M6281 Muscle weakness (generalized): Secondary | ICD-10-CM | POA: Diagnosis not present

## 2021-05-04 DIAGNOSIS — R2689 Other abnormalities of gait and mobility: Secondary | ICD-10-CM | POA: Diagnosis not present

## 2021-05-06 DIAGNOSIS — G1221 Amyotrophic lateral sclerosis: Secondary | ICD-10-CM | POA: Diagnosis not present

## 2021-06-05 DIAGNOSIS — G1221 Amyotrophic lateral sclerosis: Secondary | ICD-10-CM | POA: Diagnosis not present

## 2021-07-06 DIAGNOSIS — G1221 Amyotrophic lateral sclerosis: Secondary | ICD-10-CM | POA: Diagnosis not present

## 2021-08-06 DIAGNOSIS — G1221 Amyotrophic lateral sclerosis: Secondary | ICD-10-CM | POA: Diagnosis not present

## 2021-08-12 DIAGNOSIS — G1221 Amyotrophic lateral sclerosis: Secondary | ICD-10-CM | POA: Diagnosis not present

## 2021-08-12 DIAGNOSIS — G8250 Quadriplegia, unspecified: Secondary | ICD-10-CM | POA: Diagnosis not present

## 2021-08-12 DIAGNOSIS — R1310 Dysphagia, unspecified: Secondary | ICD-10-CM | POA: Diagnosis not present

## 2021-08-12 DIAGNOSIS — R471 Dysarthria and anarthria: Secondary | ICD-10-CM | POA: Diagnosis not present

## 2021-08-18 ENCOUNTER — Telehealth: Payer: Self-pay

## 2021-08-18 NOTE — Telephone Encounter (Signed)
(  1:35 pm) SW attempted a telephone check-in with patient/wife. His wife advised that she will call SW back as she was giving patient a sponge bath.

## 2021-09-09 ENCOUNTER — Telehealth: Payer: Self-pay | Admitting: Family Medicine

## 2021-09-09 NOTE — Telephone Encounter (Signed)
Copied from [place]. Topic: Medicare AWV ?>> Sep 09, 2021  2:04 PM Cher Nakai R wrote: ?Reason for CRM:  ?Left message for patient to call back and schedule Medicare Annual Wellness Visit (AWV) in office.  ? ?If unable to come into the office for AWV,  please offer to do virtually or by telephone. ? ?Last AWV: 09/04/2020 ? ?Please schedule at anytime with [facility]. ? ?40 minute appointment ? ?Any questions, please contact me at [phone] ?

## 2021-10-23 DIAGNOSIS — G1221 Amyotrophic lateral sclerosis: Secondary | ICD-10-CM | POA: Diagnosis not present

## 2021-10-27 DIAGNOSIS — R6339 Other feeding difficulties: Secondary | ICD-10-CM | POA: Diagnosis not present

## 2021-10-27 DIAGNOSIS — Z993 Dependence on wheelchair: Secondary | ICD-10-CM | POA: Diagnosis not present

## 2021-10-27 DIAGNOSIS — R1310 Dysphagia, unspecified: Secondary | ICD-10-CM | POA: Diagnosis not present

## 2021-10-27 DIAGNOSIS — F419 Anxiety disorder, unspecified: Secondary | ICD-10-CM | POA: Diagnosis not present

## 2021-10-27 DIAGNOSIS — Z79899 Other long term (current) drug therapy: Secondary | ICD-10-CM | POA: Diagnosis not present

## 2021-10-27 DIAGNOSIS — K5900 Constipation, unspecified: Secondary | ICD-10-CM | POA: Diagnosis not present

## 2021-10-27 DIAGNOSIS — Z931 Gastrostomy status: Secondary | ICD-10-CM | POA: Diagnosis not present

## 2021-10-27 DIAGNOSIS — G1221 Amyotrophic lateral sclerosis: Secondary | ICD-10-CM | POA: Diagnosis not present

## 2021-10-27 HISTORY — PX: PEG PLACEMENT: SHX5437

## 2021-10-28 DIAGNOSIS — R319 Hematuria, unspecified: Secondary | ICD-10-CM | POA: Diagnosis not present

## 2021-10-28 DIAGNOSIS — R1310 Dysphagia, unspecified: Secondary | ICD-10-CM | POA: Diagnosis not present

## 2021-10-28 DIAGNOSIS — M79601 Pain in right arm: Secondary | ICD-10-CM | POA: Insufficient documentation

## 2021-10-28 DIAGNOSIS — R471 Dysarthria and anarthria: Secondary | ICD-10-CM | POA: Diagnosis not present

## 2021-10-28 DIAGNOSIS — G8250 Quadriplegia, unspecified: Secondary | ICD-10-CM | POA: Diagnosis not present

## 2021-10-28 DIAGNOSIS — F419 Anxiety disorder, unspecified: Secondary | ICD-10-CM | POA: Diagnosis not present

## 2021-10-28 DIAGNOSIS — K5900 Constipation, unspecified: Secondary | ICD-10-CM | POA: Diagnosis not present

## 2021-10-28 DIAGNOSIS — Z993 Dependence on wheelchair: Secondary | ICD-10-CM | POA: Diagnosis not present

## 2021-10-28 DIAGNOSIS — Z79899 Other long term (current) drug therapy: Secondary | ICD-10-CM | POA: Diagnosis not present

## 2021-10-28 DIAGNOSIS — G1221 Amyotrophic lateral sclerosis: Secondary | ICD-10-CM | POA: Diagnosis not present

## 2021-10-28 DIAGNOSIS — Z931 Gastrostomy status: Secondary | ICD-10-CM | POA: Diagnosis not present

## 2021-10-29 DIAGNOSIS — R3129 Other microscopic hematuria: Secondary | ICD-10-CM | POA: Insufficient documentation

## 2021-10-29 DIAGNOSIS — F419 Anxiety disorder, unspecified: Secondary | ICD-10-CM | POA: Diagnosis not present

## 2021-10-29 DIAGNOSIS — G8250 Quadriplegia, unspecified: Secondary | ICD-10-CM | POA: Diagnosis not present

## 2021-10-29 DIAGNOSIS — M79601 Pain in right arm: Secondary | ICD-10-CM | POA: Diagnosis not present

## 2021-10-29 DIAGNOSIS — R319 Hematuria, unspecified: Secondary | ICD-10-CM | POA: Diagnosis not present

## 2021-10-29 DIAGNOSIS — R471 Dysarthria and anarthria: Secondary | ICD-10-CM | POA: Diagnosis not present

## 2021-10-29 DIAGNOSIS — Z931 Gastrostomy status: Secondary | ICD-10-CM | POA: Insufficient documentation

## 2021-10-29 DIAGNOSIS — G1221 Amyotrophic lateral sclerosis: Secondary | ICD-10-CM | POA: Diagnosis not present

## 2021-10-29 DIAGNOSIS — Z79899 Other long term (current) drug therapy: Secondary | ICD-10-CM | POA: Diagnosis not present

## 2021-10-29 DIAGNOSIS — R1310 Dysphagia, unspecified: Secondary | ICD-10-CM | POA: Diagnosis not present

## 2021-10-29 DIAGNOSIS — K5900 Constipation, unspecified: Secondary | ICD-10-CM | POA: Diagnosis not present

## 2021-10-29 DIAGNOSIS — Z993 Dependence on wheelchair: Secondary | ICD-10-CM | POA: Diagnosis not present

## 2021-11-04 ENCOUNTER — Telehealth: Payer: Self-pay

## 2021-11-04 NOTE — Telephone Encounter (Signed)
done

## 2021-11-04 NOTE — Telephone Encounter (Signed)
Copied from [place]. Topic: Quick Communication - Home Health Verbal Orders ?>> Nov 04, 2021 10:38 AM [place], Brayton Layman A wrote: ?Caller/Agency: [facility]  ?Callback Number: [phone]  ?Requesting: Skilled Nursing, continued care order ?

## 2021-11-06 ENCOUNTER — Telehealth: Payer: Self-pay | Admitting: Family Medicine

## 2021-11-06 NOTE — Telephone Encounter (Signed)
Pt wife is calling Burech Mcfarland (wife) is calling to follow up to see if the patient Jerry [name] orders will be expiring soon. Please advise [phone] ?

## 2021-11-09 NOTE — Telephone Encounter (Signed)
Lvm to sch a virtual appt per dr Ancil Boozer. Per Dr Ancil Boozer it will not be covered without documentation

## 2021-11-10 ENCOUNTER — Ambulatory Visit: Payer: Self-pay

## 2021-11-10 ENCOUNTER — Telehealth: Payer: Self-pay

## 2021-11-10 DIAGNOSIS — G1221 Amyotrophic lateral sclerosis: Secondary | ICD-10-CM

## 2021-11-10 NOTE — Telephone Encounter (Signed)
Copied from [place]. Topic: Quick Communication - Home Health Verbal Orders >> Nov 10, 2021  4:39 PM Tessa Lerner A wrote: Caller/Agency: [facility] Number: [phone] Requesting OT/PT/Skilled Nursing/Social Work/Speech Therapy: PT Frequency: 1w5

## 2021-11-11 NOTE — Telephone Encounter (Signed)
Verbal orders given  

## 2021-11-12 NOTE — Progress Notes (Signed)
? ?Name: Jerry [name]   MRN: [id number]    DOB: August 27, 1970   Date:11/13/2021 ? ?     Progress Note ? ?Subjective ? ?Chief Complaint ? ?Home Health Order ? ?I connected with  [name]  on 11/13/21 at 11:20 AM EDT by a video enabled telemedicine application and verified that I am speaking with the correct person using two identifiers.  I discussed the limitations of evaluation and management by telemedicine and the availability of in person appointments. The patient expressed understanding and agreed to proceed with the virtual visit  Staff also discussed with the patient that there may be a patient responsible charge related to this service. ?Patient Location: at home  ?Provider Location: [place] ?Additional Individuals present: wife  ? ?HPI ? ?ALS: he had symptoms of myoclonus and weakness for months prior to his diagnosis of ALS Summer of 2020 ? ?Last time I saw him was 10/2020  he is getting progressively weaker since  ? ?He has his wife as support ?He also has Casimer Bilis  from [facility] - she states with him 6 hours a week ?He also has 80 hours per month - [facility] - he needs total care now and the amount of hours not enough  - wife has been providing a lot of care but needs to go back to work to provide for their children  ?He just resumed home PT - it was re-started after he was admitted for the PEG tube placed April 2023 ? ?He is still continent, but has to wear a depend  ?Using a motorized wheelchair ?Using a respiratory machine for in home pulmonary therapy ?He is able to communicate with eye movement / using a tablet.  ? ?He has lost a lot of weight since Dec and PEG tube placed in April 2023 - his weight is under 90 lbs. He is still able to suck from a straw . He is still able to move his head  ? ?Malnutrition: he started on Ensure plus but caused some abdominal pain - Heal in a Meal and [employer] are the liquid supplementation he has been able to tolerate and we will send rx to his  pharmacy, drinking at least 40 oz per day  ? ?Patient Active Problem List  ? Diagnosis Date Noted  ? Severe protein-calorie malnutrition ([place]) 11/13/2021  ? ALS (amyotrophic lateral sclerosis) ([place]) 01/11/2019  ? Fasciculations of muscle 09/08/2018  ? Thenar muscle atrophy of left hand 09/08/2018  ? Prediabetes 11/05/2016  ? Family history of diabetes mellitus 11/04/2016  ? ? ?Past Surgical History:  ?Procedure Laterality Date  ? MASS EXCISION Right 11/26/2014  ? Procedure: EXCISION MASS/GROIN EXCISION MASS;  Surgeon: Robert Bellow, MD;  Location: [facility];  Service: General;  Laterality: Right;  ? MASS EXCISION Right 11/26/2014  ? Procedure: MINOR EXCISION OF MASS/POST THIGH MASS;  Surgeon: Robert Bellow, MD;  Location: [facility] OR[facility];  Service: General;  Laterality: Right;  ? PEG PLACEMENT  10/27/2021  ? PORTACATH PLACEMENT Right 06/19/2019  ? ? ?Family History  ?Problem Relation Age of Onset  ? Diabetes Father   ? Diabetes Mother   ? Diabetes Sister   ? Hypertension Sister   ? Diabetes Brother   ? Diabetes Sister   ? Fibromyalgia Sister   ? Diabetes Sister   ? ? ?Social History  ? ?Socioeconomic History  ? Marital status: Married  ?  Spouse name: Fredda Hammed  ? Number of children: 3  ?  Years of education: Not on file  ? Highest education level: High school graduate  ?Occupational History  ? 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 Occupation: student  ?  Comment: [employer]  ?Tobacco Use  ? Smoking status: Never  ? Smokeless tobacco: Never  ?Vaping Use  ? Vaping Use: Never used  ?Substance and Sexual Activity  ? Alcohol use: No  ?  Alcohol/week: 0.0 standard drinks  ? Drug use: No  ? Sexual activity: Yes  ?  Partners: Female  ?Other Topics Concern  ? Not on file  ?Social History Narrative  ? Lives with wife and 3 children  ? Approved for disability 02/2019 for ALS   ? ?Social Determinants of Health  ? ?Financial Resource Strain: Not on file  ?Food Insecurity: Not on file  ?Transportation Needs: Not  on file  ?Physical Activity: Not on file  ?Stress: Not on file  ?Social Connections: Not on file  ?Intimate Partner Violence: Not on file  ? ? ? ?Current Outpatient Medications:  ?  EPINEPHrine (EPIPEN 2-PAK) 0.3 mg/0.3 mL IJ SOAJ injection, Inject 0.3 mLs (0.3 mg total) into the muscle as needed for anaphylaxis., Disp: 1 each, Rfl: 0 ? ?Allergies  ?Allergen Reactions  ? Crab [Shellfish Allergy] Rash  ? Other Rash  ? ? ?I personally reviewed active problem list, medication list, allergies, family history, social history, health maintenance with the patient/caregiver today. ? ? ?ROS ? ?Ten systems reviewed and is negative except as mentioned in HPI  ? ?Objective ? ?Virtual encounter, vitals not obtained. ? ?There is no height or weight on file to calculate BMI. ? ?Physical Exam ? ?Awake and alert, malnourished  ? ?PHQ2/9: ? ?  11/13/2021  ?  9:03 AM 10/03/2020  ? 10:22 AM 09/04/2020  ?  2:08 PM 06/18/2020  ?  9:15 AM 06/09/2020  ?  8:50 AM  ?Depression screen PHQ 2/9  ?Decreased Interest 0 0 0 0 0  ?Down, Depressed, Hopeless 0 0 0 0 0  ?PHQ - 2 Score 0 0 0 0 0  ? ?PHQ-2/9 Result is negative.   ? ?Fall Risk: ? ?  11/13/2021  ?  9:03 AM 10/03/2020  ? 10:22 AM 09/04/2020  ?  2:16 PM 06/18/2020  ?  9:15 AM 06/09/2020  ?  8:49 AM  ?Fall Risk   ?Falls in the past year? 0 0 0 0 0  ?Number falls in past yr: 0 0 0 0 0  ?Injury with Fall? 0 0 0 0 0  ?Risk for fall due to : No Fall Risks Impaired balance/gait;Impaired mobility Impaired balance/gait;Impaired mobility Impaired mobility   ?Follow up Falls prevention discussed Falls prevention discussed Falls prevention discussed    ? ? ? ?Assessment & Plan ? ? ?Problem List Items Addressed This Visit   ? ? ALS (amyotrophic lateral sclerosis) ([place]) - Primary  ?  Symptoms started in 2019  ?Diagnosed Summer 2020 ?Needs total care now ?We will try [facility] services for 80 hours week of care ? ?  ?  ? Severe protein-calorie malnutrition ([place])  ?  He lost over 100 lbs and most the weight since Dec 2022  due to inability to eat secondary to ALS ?He had PEG tube placed April 2023  ? ? ?  ?  ?  ?I discussed the assessment and treatment plan with the patient. The patient was provided an opportunity to ask questions and all were answered. The patient agreed with the plan  and demonstrated an understanding of the instructions. ? ?The patient was advised to call back or seek an in-person evaluation if the symptoms worsen or if the condition fails to improve as anticipated. ? ?I provided 30  minutes of non-face-to-face time during this encounter.  ?

## 2021-11-13 ENCOUNTER — Encounter: Payer: Self-pay | Admitting: Family Medicine

## 2021-11-13 ENCOUNTER — Telehealth: Payer: Self-pay

## 2021-11-13 ENCOUNTER — Telehealth (INDEPENDENT_AMBULATORY_CARE_PROVIDER_SITE_OTHER): Payer: Medicare HMO | Admitting: Family Medicine

## 2021-11-13 DIAGNOSIS — E43 Unspecified severe protein-calorie malnutrition: Secondary | ICD-10-CM | POA: Diagnosis not present

## 2021-11-13 DIAGNOSIS — G1221 Amyotrophic lateral sclerosis: Secondary | ICD-10-CM | POA: Diagnosis not present

## 2021-11-13 NOTE — Assessment & Plan Note (Signed)
Symptoms started in 2019  ?Diagnosed Summer 2020 ?Needs total care now ?We will try [facility] services for 80 hours week of care ?

## 2021-11-13 NOTE — Assessment & Plan Note (Signed)
He lost over 100 lbs and most the weight since Dec 2022 due to inability to eat secondary to ALS ?He had PEG tube placed April 2023  ? ?

## 2021-11-13 NOTE — Telephone Encounter (Signed)
Copied from [place] [id number]. Topic: General - Other >> Nov 13, 2021 12:39 PM McGill, Nelva Bush wrote: Reason for CRM: Tamala Fothergill from [employer] stated old form was sent- [id number]  Correct form is needed - [id number] Date of onset needed - ICD TIN codes.  [place] re-sending forms today.

## 2021-11-17 NOTE — Progress Notes (Signed)
Name: Jerry [name]   MRN: [id number]    DOB: 02/25/1971   Date:11/18/2021 ? ?     Progress Note ? ?Subjective ? ?Chief Complaint ? ?Hospital follow up  ? ?HPI ? ?ALS: patient was able to communicate through his I-Gaze device. He states anxiety, oral secretions/drooling and sometimes pain  ? ?Anxiety: he was under hospice and was given lorazepam and was taking it prn for anxiety and sleep  but since no longer on hospice he is no longer taking it. He has to use a respiratory machine and makes him nervous.  We will give him hydroxyzine for sleep  ? ?Oral secretions: he is out of [place] given by neurologist and advised wife to fill prescription to control symptoms  ? ?Paresthesia : feet is tingling on his legs and feet. Sometimes edema of his feet. Explained edema likely from position and may also be due to malnutrition. Explained they should discuss it with neurologist  ? ? ?Patient Active Problem List  ? Diagnosis Date Noted  ? Anxiety 11/18/2021  ? Bed sore on buttock, right, unstageable ([place]) 11/18/2021  ? Severe protein-calorie malnutrition ([place]) 11/13/2021  ? Microscopic hematuria 10/29/2021  ? S/P percutaneous endoscopic gastrostomy (PEG) tube placement ([place]) 10/29/2021  ? ALS (amyotrophic lateral sclerosis) ([place]) 01/11/2019  ? ? ?Past Surgical History:  ?Procedure Laterality Date  ? MASS EXCISION Right 11/26/2014  ? Procedure: EXCISION MASS/GROIN EXCISION MASS;  Surgeon: Robert Bellow, MD;  Location: [facility];  Service: General;  Laterality: Right;  ? MASS EXCISION Right 11/26/2014  ? Procedure: MINOR EXCISION OF MASS/POST THIGH MASS;  Surgeon: Robert Bellow, MD;  Location: [facility];  Service: General;  Laterality: Right;  ? PEG PLACEMENT  10/27/2021  ? PORTACATH PLACEMENT Right 06/19/2019  ? ? ?Family History  ?Problem Relation Age of Onset  ? Diabetes Father   ? Diabetes Mother   ? Diabetes Sister   ? Hypertension Sister   ? Diabetes Brother   ? Diabetes Sister   ? Fibromyalgia Sister   ? Diabetes Sister    ? ? ?Social History  ? ?Tobacco Use  ? Smoking status: Never  ? Smokeless tobacco: Never  ?Substance Use Topics  ? Alcohol use: No  ?  Alcohol/week: 0.0 standard drinks  ? ? ? ?Current Outpatient Medications:  ?  EPINEPHrine (EPIPEN 2-PAK) 0.3 mg/0.3 mL IJ SOAJ injection, Inject 0.3 mLs (0.3 mg total) into the muscle as needed for anaphylaxis., Disp: 1 each, Rfl: 0 ?  hydrOXYzine (ATARAX) 10 MG/5ML syrup, Place 5-10 mLs (10-20 mg total) into feeding tube at bedtime., Disp: 240 mL, Rfl: 0 ?  LORazepam (LORAZEPAM INTENSOL) 2 MG/ML concentrated solution, Take 0.5 mLs (1 mg total) by mouth daily as needed for anxiety., Disp: 30 mL, Rfl: 0 ? ?Allergies  ?Allergen Reactions  ? Crab [Shellfish Allergy] Rash  ? Other Rash  ? ? ?I personally reviewed active problem list, medication list, allergies, family history, social history with the patient/caregiver today. ? ? ?ROS ? ?Ten systems reviewed and is negative except as mentioned in HPI  ? ?Objective ? ?Vitals:  ? 11/18/21 1120  ?BP: 124/78  ?Pulse: 95  ?Resp: 12  ?Temp: 98.8 ?F (37.1 ?C)  ?TempSrc: Oral  ?SpO2: 96%  ?Weight: 65 lb 4.8 oz (29.6 kg)  ?Height: '5\' 5"'$  (1.651 m)  ? ? ?Body mass index is 10.87 kg/m?. ? ?Physical Exam ? ?Constitutional: Patient appears frail, malnourished  ?HEENT: head atraumatic, normocephalic, pupils equal and reactive to light, neck  supple, drooling  ?Cardiovascular: Normal rate, regular rhythm and normal heart sounds.  No murmur heard. Foot BLE edema. ?Pulmonary/Chest: Effort normal and breath sounds normal. No respiratory distress. ?Abdominal: Soft.  There is no tenderness. PEG tube in place and no signs of infection  ?Psychiatric: Patient has a normal mood and affect. behavior is normal. Judgment and thought content normal. Communicating through the eye-gaze tablet  ?Muscular skeletal: able to move his head and eyes, suck from a straw, able to move legs but unable to bear weight . Fasciculations noticed on left upper arm ? ?PHQ2/9: ? ?   11/13/2021  ?  9:03 AM 10/03/2020  ? 10:22 AM 09/04/2020  ?  2:08 PM 06/18/2020  ?  9:15 AM 06/09/2020  ?  8:50 AM  ?Depression screen PHQ 2/9  ?Decreased Interest 0 0 0 0 0  ?Down, Depressed, Hopeless 0 0 0 0 0  ?PHQ - 2 Score 0 0 0 0 0  ?  ?phq 9 is negative ? ? ?Fall Risk: ? ?  11/18/2021  ? 11:22 AM 11/13/2021  ?  9:03 AM 10/03/2020  ? 10:22 AM 09/04/2020  ?  2:16 PM 06/18/2020  ?  9:15 AM  ?Fall Risk   ?Falls in the past year? 0 0 0 0 0  ?Number falls in past yr:  0 0 0 0  ?Injury with Fall?  0 0 0 0  ?Risk for fall due to : Impaired mobility No Fall Risks Impaired balance/gait;Impaired mobility Impaired balance/gait;Impaired mobility Impaired mobility  ?Follow up Falls evaluation completed;Education provided Falls prevention discussed Falls prevention discussed Falls prevention discussed   ? ? ? ? ?Functional Status Survey: ?Is the patient deaf or have difficulty hearing?: No ?Does the patient have difficulty seeing, even when wearing glasses/contacts?: No ?Does the patient have difficulty concentrating, remembering, or making decisions?: No ?Does the patient have difficulty walking or climbing stairs?: Yes ?Does the patient have difficulty dressing or bathing?: Yes ?Does the patient have difficulty doing errands alone such as visiting a doctor's office or shopping?: Yes ? ? ? ?Assessment & Plan ? ?Problem List Items Addressed This Visit   ? ? ALS (amyotrophic lateral sclerosis) ([place]) - Primary  ?  He asked about another Eye Gaze machine but advised to discuss it with ALS clinic in June ?He gets anxious when using the respiratory machine and we will resume lorazepam ?Difficulty sleeping and we will try hydroxyzine but explained may cause dryness of lung secretions and to monitor   ? ?  ?  ? Relevant Orders  ? Amb Referral to Palliative Care  ? Severe protein-calorie malnutrition ([place])  ?  Gaining weight since PEG tube placed  ? ?  ?  ? Relevant Orders  ? Amb Referral to Palliative Care  ? Anxiety  ? Relevant Medications   ? LORazepam (LORAZEPAM INTENSOL) 2 MG/ML concentrated solution  ? hydrOXYzine (ATARAX) 10 MG/5ML syrup  ? Bed sore on buttock, right, unstageable ([place])  ?  Noticed by wife about 10 days ago , she is shifting him on chair more often, cleaning with soap and water and using a protective cream ?He has home health and they recommended a protective barrier but she has not been able to start it yet  ? ?  ?  ? S/P percutaneous endoscopic gastrostomy (PEG) tube placement ([place])  ? ? ?

## 2021-11-18 ENCOUNTER — Ambulatory Visit (INDEPENDENT_AMBULATORY_CARE_PROVIDER_SITE_OTHER): Payer: Medicare HMO | Admitting: Family Medicine

## 2021-11-18 ENCOUNTER — Encounter: Payer: Self-pay | Admitting: Family Medicine

## 2021-11-18 VITALS — BP 124/78 | HR 95 | Temp 98.8°F | Resp 12 | Ht 65.0 in | Wt <= 1120 oz

## 2021-11-18 DIAGNOSIS — Z931 Gastrostomy status: Secondary | ICD-10-CM

## 2021-11-18 DIAGNOSIS — R6889 Other general symptoms and signs: Secondary | ICD-10-CM | POA: Diagnosis not present

## 2021-11-18 DIAGNOSIS — L89310 Pressure ulcer of right buttock, unstageable: Secondary | ICD-10-CM | POA: Diagnosis not present

## 2021-11-18 DIAGNOSIS — F419 Anxiety disorder, unspecified: Secondary | ICD-10-CM

## 2021-11-18 DIAGNOSIS — E43 Unspecified severe protein-calorie malnutrition: Secondary | ICD-10-CM

## 2021-11-18 DIAGNOSIS — G1221 Amyotrophic lateral sclerosis: Secondary | ICD-10-CM

## 2021-11-18 MED ORDER — LORAZEPAM 2 MG/ML PO CONC: 1.0000 mg | Freq: Every day | ORAL | 0 refills | Status: DC | PRN

## 2021-11-18 MED ORDER — HYDROXYZINE HCL 10 MG/5ML PO SYRP: 10.0000 mg | ORAL_SOLUTION | Freq: Every day | ORAL | 0 refills | Status: DC

## 2021-11-18 NOTE — Assessment & Plan Note (Signed)
He asked about another Eye Gaze machine but advised to discuss it with ALS clinic in June ?He gets anxious when using the respiratory machine and we will resume lorazepam ?Difficulty sleeping and we will try hydroxyzine but explained may cause dryness of lung secretions and to monitor   ?

## 2021-11-18 NOTE — Assessment & Plan Note (Signed)
Gaining weight since PEG tube placed  ?

## 2021-11-18 NOTE — Assessment & Plan Note (Signed)
Noticed by wife about 10 days ago , she is shifting him on chair more often, cleaning with soap and water and using a protective cream ?He has home health and they recommended a protective barrier but she has not been able to start it yet  ?

## 2021-11-19 ENCOUNTER — Telehealth: Payer: Self-pay

## 2021-11-19 NOTE — Telephone Encounter (Signed)
Spoke with patient's spouse Fredda Hammed and scheduled a Mychart Palliative Consult for 11/24/21 @ 10 AM.  Consent obtained; updated Netsmart, Team List and Epic.

## 2021-11-20 ENCOUNTER — Ambulatory Visit: Payer: Self-pay

## 2021-11-20 DIAGNOSIS — G1221 Amyotrophic lateral sclerosis: Secondary | ICD-10-CM

## 2021-11-20 NOTE — Chronic Care Management (AMB) (Signed)
Error Please Disregard

## 2021-11-24 ENCOUNTER — Telehealth: Payer: Medicaid Other | Admitting: Internal Medicine

## 2021-11-26 ENCOUNTER — Telehealth: Payer: Self-pay

## 2021-11-26 NOTE — Telephone Encounter (Signed)
Lvm with [facility] to get fax number.

## 2021-11-26 NOTE — Telephone Encounter (Signed)
Copied from [place] [id number]. Topic: General - Other >> Nov 25, 2021  4:47 PM Bayard Beaver wrote: Reason for CRM:pt's wife called in about referral, that was discussed in office May 9,for pcs services, she states , they received referral info from [facility], but pt hasnt been with them in over a year. They are with [facility] now. Please cal back for further assistance.

## 2021-12-01 ENCOUNTER — Telehealth: Payer: Self-pay | Admitting: Family Medicine

## 2021-12-01 NOTE — Telephone Encounter (Signed)
Copied from [place] [id number]. Topic: Medicare AWV >> Dec 01, 2021 11:24 AM Cher Nakai R wrote: Reason for CRM:  Left message for patient to call back and schedule Medicare Annual Wellness Visit (AWV) in office.   If unable to come into the office for AWV,  please offer to do virtually or by telephone.  Last AWV:  09/04/2020  Please schedule at anytime with [place].  30 minute appointment for Virtual or phone 45 minute appointment for in office or Initial virtual/phone  Any questions, please contact me at [phone]

## 2021-12-09 ENCOUNTER — Emergency Department (HOSPITAL_COMMUNITY): Payer: Medicare HMO

## 2021-12-09 ENCOUNTER — Encounter (HOSPITAL_COMMUNITY): Payer: Self-pay

## 2021-12-09 ENCOUNTER — Inpatient Hospital Stay (HOSPITAL_COMMUNITY)
Admission: EM | Admit: 2021-12-09 | Discharge: 2022-01-08 | DRG: 004 | Disposition: A | Discharge status: Other Acute Inpatient Hospital | Payer: Medicare HMO | Attending: Internal Medicine | Admitting: Internal Medicine

## 2021-12-09 DIAGNOSIS — J9690 Respiratory failure, unspecified, unspecified whether with hypoxia or hypercapnia: Secondary | ICD-10-CM | POA: Diagnosis not present

## 2021-12-09 DIAGNOSIS — Z8616 Personal history of COVID-19: Secondary | ICD-10-CM | POA: Diagnosis not present

## 2021-12-09 DIAGNOSIS — E876 Hypokalemia: Secondary | ICD-10-CM | POA: Diagnosis not present

## 2021-12-09 DIAGNOSIS — Z4682 Encounter for fitting and adjustment of non-vascular catheter: Secondary | ICD-10-CM | POA: Diagnosis not present

## 2021-12-09 DIAGNOSIS — J9601 Acute respiratory failure with hypoxia: Secondary | ICD-10-CM | POA: Diagnosis not present

## 2021-12-09 DIAGNOSIS — Z515 Encounter for palliative care: Secondary | ICD-10-CM | POA: Diagnosis not present

## 2021-12-09 DIAGNOSIS — A419 Sepsis, unspecified organism: Secondary | ICD-10-CM | POA: Diagnosis present

## 2021-12-09 DIAGNOSIS — J9611 Chronic respiratory failure with hypoxia: Secondary | ICD-10-CM | POA: Diagnosis not present

## 2021-12-09 DIAGNOSIS — R1084 Generalized abdominal pain: Secondary | ICD-10-CM | POA: Diagnosis not present

## 2021-12-09 DIAGNOSIS — Z931 Gastrostomy status: Secondary | ICD-10-CM

## 2021-12-09 DIAGNOSIS — R092 Respiratory arrest: Principal | ICD-10-CM

## 2021-12-09 DIAGNOSIS — Z8249 Family history of ischemic heart disease and other diseases of the circulatory system: Secondary | ICD-10-CM | POA: Diagnosis not present

## 2021-12-09 DIAGNOSIS — Z681 Body mass index (BMI) 19 or less, adult: Secondary | ICD-10-CM

## 2021-12-09 DIAGNOSIS — J40 Bronchitis, not specified as acute or chronic: Secondary | ICD-10-CM | POA: Diagnosis present

## 2021-12-09 DIAGNOSIS — A4152 Sepsis due to Pseudomonas: Secondary | ICD-10-CM | POA: Diagnosis present

## 2021-12-09 DIAGNOSIS — J9621 Acute and chronic respiratory failure with hypoxia: Principal | ICD-10-CM | POA: Diagnosis present

## 2021-12-09 DIAGNOSIS — Z9911 Dependence on respirator [ventilator] status: Secondary | ICD-10-CM | POA: Diagnosis not present

## 2021-12-09 DIAGNOSIS — Z930 Tracheostomy status: Secondary | ICD-10-CM

## 2021-12-09 DIAGNOSIS — G909 Disorder of the autonomic nervous system, unspecified: Secondary | ICD-10-CM | POA: Diagnosis not present

## 2021-12-09 DIAGNOSIS — E871 Hypo-osmolality and hyponatremia: Secondary | ICD-10-CM | POA: Diagnosis not present

## 2021-12-09 DIAGNOSIS — F419 Anxiety disorder, unspecified: Secondary | ICD-10-CM | POA: Diagnosis present

## 2021-12-09 DIAGNOSIS — Z7401 Bed confinement status: Secondary | ICD-10-CM | POA: Diagnosis not present

## 2021-12-09 DIAGNOSIS — K567 Ileus, unspecified: Secondary | ICD-10-CM | POA: Diagnosis present

## 2021-12-09 DIAGNOSIS — R64 Cachexia: Secondary | ICD-10-CM | POA: Diagnosis present

## 2021-12-09 DIAGNOSIS — G1221 Amyotrophic lateral sclerosis: Secondary | ICD-10-CM | POA: Diagnosis not present

## 2021-12-09 DIAGNOSIS — Z833 Family history of diabetes mellitus: Secondary | ICD-10-CM

## 2021-12-09 DIAGNOSIS — K5900 Constipation, unspecified: Secondary | ICD-10-CM | POA: Diagnosis not present

## 2021-12-09 DIAGNOSIS — J9602 Acute respiratory failure with hypercapnia: Secondary | ICD-10-CM

## 2021-12-09 DIAGNOSIS — R6521 Severe sepsis with septic shock: Secondary | ICD-10-CM

## 2021-12-09 DIAGNOSIS — Z95810 Presence of automatic (implantable) cardiac defibrillator: Secondary | ICD-10-CM | POA: Diagnosis not present

## 2021-12-09 DIAGNOSIS — R636 Underweight: Secondary | ICD-10-CM | POA: Diagnosis present

## 2021-12-09 DIAGNOSIS — R4020 Unspecified coma: Secondary | ICD-10-CM | POA: Diagnosis not present

## 2021-12-09 DIAGNOSIS — R000 Tachycardia, unspecified: Secondary | ICD-10-CM | POA: Diagnosis not present

## 2021-12-09 DIAGNOSIS — J9612 Chronic respiratory failure with hypercapnia: Secondary | ICD-10-CM | POA: Diagnosis not present

## 2021-12-09 DIAGNOSIS — I469 Cardiac arrest, cause unspecified: Secondary | ICD-10-CM

## 2021-12-09 DIAGNOSIS — R140 Abdominal distension (gaseous): Secondary | ICD-10-CM | POA: Diagnosis not present

## 2021-12-09 DIAGNOSIS — L89811 Pressure ulcer of head, stage 1: Secondary | ICD-10-CM | POA: Diagnosis present

## 2021-12-09 DIAGNOSIS — J219 Acute bronchiolitis, unspecified: Secondary | ICD-10-CM | POA: Diagnosis present

## 2021-12-09 DIAGNOSIS — G4700 Insomnia, unspecified: Secondary | ICD-10-CM | POA: Diagnosis present

## 2021-12-09 DIAGNOSIS — J9622 Acute and chronic respiratory failure with hypercapnia: Secondary | ICD-10-CM | POA: Diagnosis present

## 2021-12-09 DIAGNOSIS — R0602 Shortness of breath: Secondary | ICD-10-CM | POA: Diagnosis not present

## 2021-12-09 DIAGNOSIS — J151 Pneumonia due to Pseudomonas: Secondary | ICD-10-CM

## 2021-12-09 DIAGNOSIS — L8990 Pressure ulcer of unspecified site, unspecified stage: Secondary | ICD-10-CM | POA: Insufficient documentation

## 2021-12-09 DIAGNOSIS — I468 Cardiac arrest due to other underlying condition: Secondary | ICD-10-CM | POA: Diagnosis not present

## 2021-12-09 DIAGNOSIS — K5939 Other megacolon: Secondary | ICD-10-CM | POA: Diagnosis not present

## 2021-12-09 DIAGNOSIS — K6389 Other specified diseases of intestine: Secondary | ICD-10-CM | POA: Diagnosis not present

## 2021-12-09 DIAGNOSIS — R404 Transient alteration of awareness: Secondary | ICD-10-CM | POA: Diagnosis not present

## 2021-12-09 DIAGNOSIS — R0902 Hypoxemia: Secondary | ICD-10-CM | POA: Diagnosis not present

## 2021-12-09 LAB — I-STAT CHEM 8, ED
BUN: 22 mg/dL — ABNORMAL HIGH (ref 6–20)
Calcium, Ion: 1.2 mmol/L (ref 1.15–1.40)
Chloride: 75 mmol/L — ABNORMAL LOW (ref 98–111)
Creatinine, Ser: 0.4 mg/dL — ABNORMAL LOW (ref 0.61–1.24)
Glucose, Bld: 102 mg/dL — ABNORMAL HIGH (ref 70–99)
HCT: 47 % (ref 39.0–52.0)
Hemoglobin: 16 g/dL (ref 13.0–17.0)
Potassium: 3.9 mmol/L (ref 3.5–5.1)
Sodium: 119 mmol/L — CL (ref 135–145)
TCO2: 37 mmol/L — ABNORMAL HIGH (ref 22–32)

## 2021-12-09 LAB — I-STAT ARTERIAL BLOOD GAS, ED
Acid-Base Excess: 10 mmol/L — ABNORMAL HIGH (ref 0.0–2.0)
Bicarbonate: 36.2 mmol/L — ABNORMAL HIGH (ref 20.0–28.0)
Calcium, Ion: 1.15 mmol/L (ref 1.15–1.40)
HCT: 40 % (ref 39.0–52.0)
Hemoglobin: 13.6 g/dL (ref 13.0–17.0)
O2 Saturation: 100 %
Patient temperature: 96.4
Potassium: 3.1 mmol/L — ABNORMAL LOW (ref 3.5–5.1)
Sodium: 121 mmol/L — ABNORMAL LOW (ref 135–145)
TCO2: 38 mmol/L — ABNORMAL HIGH (ref 22–32)
pCO2 arterial: 48.8 mmHg — ABNORMAL HIGH (ref 32–48)
pH, Arterial: 7.473 — ABNORMAL HIGH (ref 7.35–7.45)
pO2, Arterial: 370 mmHg — ABNORMAL HIGH (ref 83–108)

## 2021-12-09 LAB — URINALYSIS, ROUTINE W REFLEX MICROSCOPIC
Bilirubin Urine: NEGATIVE
Glucose, UA: NEGATIVE mg/dL
Ketones, ur: 20 mg/dL — AB
Leukocytes,Ua: NEGATIVE
Nitrite: NEGATIVE
Protein, ur: NEGATIVE mg/dL
Specific Gravity, Urine: 1.026 (ref 1.005–1.030)
pH: 6 (ref 5.0–8.0)

## 2021-12-09 LAB — COMPREHENSIVE METABOLIC PANEL
ALT: 32 U/L (ref 0–44)
AST: 28 U/L (ref 15–41)
Albumin: 4.2 g/dL (ref 3.5–5.0)
Alkaline Phosphatase: 53 U/L (ref 38–126)
Anion gap: 14 (ref 5–15)
BUN: 18 mg/dL (ref 6–20)
CO2: 32 mmol/L (ref 22–32)
Calcium: 9.9 mg/dL (ref 8.9–10.3)
Chloride: 74 mmol/L — ABNORMAL LOW (ref 98–111)
Creatinine, Ser: 0.36 mg/dL — ABNORMAL LOW (ref 0.61–1.24)
GFR, Estimated: >60 mL/min (ref >60)
Glucose, Bld: 104 mg/dL — ABNORMAL HIGH (ref 70–99)
Potassium: 4 mmol/L (ref 3.5–5.1)
Sodium: 120 mmol/L — ABNORMAL LOW (ref 135–145)
Total Bilirubin: 0.7 mg/dL (ref 0.3–1.2)
Total Protein: 6.8 g/dL (ref 6.5–8.1)

## 2021-12-09 LAB — GLUCOSE, CAPILLARY
Glucose-Capillary: 157 mg/dL — ABNORMAL HIGH (ref 70–99)
Glucose-Capillary: 192 mg/dL — ABNORMAL HIGH (ref 70–99)
Glucose-Capillary: 178 mg/dL — ABNORMAL HIGH (ref 70–99)
Glucose-Capillary: 100 mg/dL — ABNORMAL HIGH (ref 70–99)
Glucose-Capillary: 111 mg/dL — ABNORMAL HIGH (ref 70–99)

## 2021-12-09 LAB — CBC WITH DIFFERENTIAL/PLATELET
Abs Immature Granulocytes: 0.02 10*3/uL (ref 0.00–0.07)
Basophils Absolute: 0 10*3/uL (ref 0.0–0.1)
Basophils Relative: 0 %
Eosinophils Absolute: 0 10*3/uL (ref 0.0–0.5)
Eosinophils Relative: 0 %
HCT: 43 % (ref 39.0–52.0)
Hemoglobin: 15.1 g/dL (ref 13.0–17.0)
Immature Granulocytes: 0 %
Lymphocytes Relative: 12 %
Lymphs Abs: 1 10*3/uL (ref 0.7–4.0)
MCH: 30.2 pg (ref 26.0–34.0)
MCHC: 35.1 g/dL (ref 30.0–36.0)
MCV: 86 fL (ref 80.0–100.0)
Monocytes Absolute: 0.5 10*3/uL (ref 0.1–1.0)
Monocytes Relative: 7 %
Neutro Abs: 6.6 10*3/uL (ref 1.7–7.7)
Neutrophils Relative %: 81 %
Platelets: 234 10*3/uL (ref 150–400)
RBC: 5 MIL/uL (ref 4.22–5.81)
RDW: 13.4 % (ref 11.5–15.5)
WBC: 8.1 10*3/uL (ref 4.0–10.5)
nRBC: 0 % (ref 0.0–0.2)

## 2021-12-09 LAB — I-STAT VENOUS BLOOD GAS, ED
Acid-Base Excess: 11 mmol/L — ABNORMAL HIGH (ref 0.0–2.0)
Bicarbonate: 40.6 mmol/L — ABNORMAL HIGH (ref 20.0–28.0)
Calcium, Ion: 1.22 mmol/L (ref 1.15–1.40)
HCT: 48 % (ref 39.0–52.0)
Hemoglobin: 16.3 g/dL (ref 13.0–17.0)
O2 Saturation: 99 %
Potassium: 3.8 mmol/L (ref 3.5–5.1)
Sodium: 119 mmol/L — CL (ref 135–145)
TCO2: 43 mmol/L — ABNORMAL HIGH (ref 22–32)
pCO2, Ven: 71.5 mmHg (ref 44–60)
pH, Ven: 7.362 (ref 7.25–7.43)
pO2, Ven: 170 mmHg — ABNORMAL HIGH (ref 32–45)

## 2021-12-09 LAB — LACTIC ACID, PLASMA: Lactic Acid, Venous: 1.5 mmol/L (ref 0.5–1.9)

## 2021-12-09 LAB — HIV ANTIBODY (ROUTINE TESTING W REFLEX): HIV Screen 4th Generation wRfx: NONREACTIVE

## 2021-12-09 LAB — MRSA NEXT GEN BY PCR, NASAL: MRSA by PCR Next Gen: NOT DETECTED

## 2021-12-09 LAB — HEMOGLOBIN A1C
Hgb A1c MFr Bld: 5.4 % (ref 4.8–5.6)
Mean Plasma Glucose: 108.28 mg/dL

## 2021-12-09 LAB — CBG MONITORING, ED: Glucose-Capillary: 154 mg/dL — ABNORMAL HIGH (ref 70–99)

## 2021-12-09 LAB — TROPONIN I (HIGH SENSITIVITY): Troponin I (High Sensitivity): 68 ng/L — ABNORMAL HIGH (ref <18)

## 2021-12-09 LAB — LIPASE, BLOOD: Lipase: 31 U/L (ref 11–51)

## 2021-12-09 MED ORDER — FENTANYL CITRATE (PF) 100 MCG/2ML IJ SOLN
INTRAMUSCULAR | Status: AC
  Filled 2021-12-09: qty 2

## 2021-12-09 MED ORDER — ENOXAPARIN SODIUM 40 MG/0.4ML IJ SOSY: 40.0000 mg | PREFILLED_SYRINGE | INTRAMUSCULAR | Status: DC

## 2021-12-09 MED ORDER — FENTANYL CITRATE PF 50 MCG/ML IJ SOSY
50.0000 ug | PREFILLED_SYRINGE | Freq: Once | INTRAMUSCULAR | Status: AC
  Administered 2021-12-09: 50 ug via INTRAVENOUS

## 2021-12-09 MED ORDER — CHLORHEXIDINE GLUCONATE 0.12% ORAL RINSE (MEDLINE KIT)
15.0000 mL | Freq: Two times a day (BID) | OROMUCOSAL | Status: DC
  Administered 2021-12-09 – 2021-12-17 (×18): 15 mL via OROMUCOSAL

## 2021-12-09 MED ORDER — SODIUM CHLORIDE 0.9 % IV SOLN: INTRAVENOUS | Status: DC

## 2021-12-09 MED ORDER — SODIUM CHLORIDE 0.9 % IV BOLUS
500.0000 mL | Freq: Once | INTRAVENOUS | Status: AC
  Administered 2021-12-09: 500 mL via INTRAVENOUS

## 2021-12-09 MED ORDER — SODIUM CHLORIDE 0.9 % IV SOLN
1.5000 g | Freq: Four times a day (QID) | INTRAVENOUS | Status: DC
  Administered 2021-12-09 – 2021-12-11 (×8): 1.5 g via INTRAVENOUS
  Filled 2021-12-09 (×15): qty 4

## 2021-12-09 MED ORDER — SODIUM CHLORIDE 0.9% FLUSH
10.0000 mL | Freq: Two times a day (BID) | INTRAVENOUS | Status: DC
  Administered 2021-12-09 – 2021-12-18 (×19): 10 mL
  Administered 2021-12-19: 20 mL
  Administered 2021-12-19: 10 mL
  Administered 2021-12-20: 20 mL
  Administered 2021-12-20 – 2022-01-08 (×32): 10 mL

## 2021-12-09 MED ORDER — BISACODYL 10 MG RE SUPP
10.0000 mg | Freq: Once | RECTAL | Status: DC
Start: 2021-12-09 — End: 2021-12-16

## 2021-12-09 MED ORDER — ETOMIDATE 2 MG/ML IV SOLN: 20.0000 mg | Freq: Once | INTRAVENOUS | Status: AC

## 2021-12-09 MED ORDER — SODIUM CHLORIDE 0.9 % IV SOLN: INTRAVENOUS | Status: DC | PRN

## 2021-12-09 MED ORDER — FENTANYL 2500MCG IN NS 250ML (10MCG/ML) PREMIX INFUSION
0.0000 ug/h | INTRAVENOUS | Status: DC
  Filled 2021-12-09: qty 250

## 2021-12-09 MED ORDER — INSULIN ASPART 100 UNIT/ML IJ SOLN
0.0000 [IU] | INTRAMUSCULAR | Status: DC
  Administered 2021-12-09 (×3): 2 [IU] via SUBCUTANEOUS
  Administered 2021-12-10 – 2021-12-11 (×2): 1 [IU] via SUBCUTANEOUS
  Administered 2021-12-11: 2 [IU] via SUBCUTANEOUS
  Administered 2021-12-11 (×2): 1 [IU] via SUBCUTANEOUS
  Administered 2021-12-11: 2 [IU] via SUBCUTANEOUS
  Administered 2021-12-11: 1 [IU] via SUBCUTANEOUS
  Administered 2021-12-12: 2 [IU] via SUBCUTANEOUS
  Administered 2021-12-12: 1 [IU] via SUBCUTANEOUS
  Administered 2021-12-12: 2 [IU] via SUBCUTANEOUS
  Administered 2021-12-12 – 2021-12-13 (×4): 1 [IU] via SUBCUTANEOUS
  Administered 2021-12-14: 2 [IU] via SUBCUTANEOUS
  Administered 2021-12-14 – 2021-12-15 (×3): 1 [IU] via SUBCUTANEOUS
  Administered 2021-12-15 (×2): 2 [IU] via SUBCUTANEOUS
  Administered 2021-12-15 – 2021-12-17 (×4): 1 [IU] via SUBCUTANEOUS

## 2021-12-09 MED ORDER — IOHEXOL 350 MG/ML SOLN
80.0000 mL | Freq: Once | INTRAVENOUS | Status: AC | PRN
  Administered 2021-12-09: 80 mL via INTRAVENOUS

## 2021-12-09 MED ORDER — ORAL CARE MOUTH RINSE
15.0000 mL | OROMUCOSAL | Status: DC
  Administered 2021-12-09 – 2021-12-17 (×73): 15 mL via OROMUCOSAL

## 2021-12-09 MED ORDER — ETOMIDATE 2 MG/ML IV SOLN
INTRAVENOUS | Status: AC
  Administered 2021-12-09: 20 mg via INTRAVENOUS
  Filled 2021-12-09: qty 10

## 2021-12-09 MED ORDER — ETOMIDATE 2 MG/ML IV SOLN
INTRAVENOUS | Status: AC | PRN
  Administered 2021-12-09: 10 mg via INTRAVENOUS

## 2021-12-09 MED ORDER — PHENYLEPHRINE 80 MCG/ML (10ML) SYRINGE FOR IV PUSH (FOR BLOOD PRESSURE SUPPORT)
PREFILLED_SYRINGE | INTRAVENOUS | Status: AC
  Administered 2021-12-09: 800 ug
  Filled 2021-12-09: qty 10

## 2021-12-09 MED ORDER — ETOMIDATE 2 MG/ML IV SOLN
10.0000 mg | Freq: Once | INTRAVENOUS | Status: DC
Start: 2021-12-09 — End: 2021-12-09

## 2021-12-09 MED ORDER — VASOPRESSIN 20 UNITS/100 ML INFUSION FOR SHOCK
INTRAVENOUS | Status: AC
  Administered 2021-12-09: 0.03 [IU]/min via INTRAVENOUS
  Filled 2021-12-09: qty 100

## 2021-12-09 MED ORDER — CHLORHEXIDINE GLUCONATE CLOTH 2 % EX PADS
6.0000 | MEDICATED_PAD | Freq: Every day | CUTANEOUS | Status: DC
  Administered 2021-12-09 – 2022-01-08 (×30): 6 via TOPICAL

## 2021-12-09 MED ORDER — ROCURONIUM BROMIDE 10 MG/ML (PF) SYRINGE
PREFILLED_SYRINGE | INTRAVENOUS | Status: AC
  Administered 2021-12-09: 100 mg via INTRAVENOUS
  Filled 2021-12-09: qty 10

## 2021-12-09 MED ORDER — HYDROCORTISONE SOD SUC (PF) 100 MG IJ SOLR
100.0000 mg | Freq: Two times a day (BID) | INTRAMUSCULAR | Status: DC
  Administered 2021-12-09 – 2021-12-10 (×4): 100 mg via INTRAVENOUS
  Filled 2021-12-09 (×5): qty 2

## 2021-12-09 MED ORDER — ENOXAPARIN SODIUM 30 MG/0.3ML IJ SOSY
30.0000 mg | PREFILLED_SYRINGE | INTRAMUSCULAR | Status: DC
  Administered 2021-12-09 – 2021-12-10 (×2): 30 mg via SUBCUTANEOUS
  Filled 2021-12-09 (×2): qty 0.3

## 2021-12-09 MED ORDER — SODIUM CHLORIDE 0.9% FLUSH
10.0000 mL | INTRAVENOUS | Status: DC | PRN
  Administered 2022-01-06: 10 mL

## 2021-12-09 MED ORDER — ROCURONIUM BROMIDE 50 MG/5ML IV SOLN
INTRAVENOUS | Status: AC | PRN
  Administered 2021-12-09: 60 mg via INTRAVENOUS

## 2021-12-09 MED ORDER — ROCURONIUM BROMIDE 50 MG/5ML IV SOLN
100.0000 mg | Freq: Once | INTRAVENOUS | Status: AC
  Filled 2021-12-09: qty 10

## 2021-12-09 MED ORDER — EPINEPHRINE 1 MG/10ML IJ SOSY
PREFILLED_SYRINGE | INTRAMUSCULAR | Status: AC | PRN
  Administered 2021-12-09: 1 mg via INTRAVENOUS

## 2021-12-09 MED ORDER — FENTANYL 2500MCG IN NS 250ML (10MCG/ML) PREMIX INFUSION
INTRAVENOUS | Status: AC
  Administered 2021-12-09: 25 ug/h via INTRAVENOUS
  Filled 2021-12-09: qty 250

## 2021-12-09 MED ORDER — VASOPRESSIN 20 UNITS/100 ML INFUSION FOR SHOCK
0.0000 [IU]/min | INTRAVENOUS | Status: DC
  Administered 2021-12-09: 0.03 [IU]/min via INTRAVENOUS

## 2021-12-09 MED ORDER — NOREPINEPHRINE 4 MG/250ML-% IV SOLN
0.0000 ug/min | INTRAVENOUS | Status: DC
  Administered 2021-12-09: 20 ug/min via INTRAVENOUS
  Administered 2021-12-09: 10 ug/min via INTRAVENOUS
  Administered 2021-12-09: 16 ug/min via INTRAVENOUS
  Administered 2021-12-09: 40 ug/min via INTRAVENOUS
  Administered 2021-12-10: 20 ug/min via INTRAVENOUS
  Administered 2021-12-10: 18 ug/min via INTRAVENOUS
  Administered 2021-12-10: 12 ug/min via INTRAVENOUS
  Administered 2021-12-10: 7 ug/min via INTRAVENOUS
  Administered 2021-12-12 (×2): 2 ug/min via INTRAVENOUS
  Filled 2021-12-09 (×11): qty 250

## 2021-12-09 MED ORDER — PHENYLEPHRINE 80 MCG/ML (10ML) SYRINGE FOR IV PUSH (FOR BLOOD PRESSURE SUPPORT)
PREFILLED_SYRINGE | INTRAVENOUS | Status: AC
  Filled 2021-12-09: qty 10

## 2021-12-09 NOTE — ED Notes (Signed)
Pt taken off home trilogy and placed on NRB

## 2021-12-09 NOTE — Progress Notes (Signed)
Ventilator patient transported from Trauma B to 3M05 without any complications.

## 2021-12-09 NOTE — Procedures (Signed)
Arterial Catheter Insertion Procedure Note  Jerry [name]  [id number]  Mar 24, 1971  Date:12/09/21  Time:11:04 AM    Provider Performing: Ned Grace    Procedure: Insertion of Arterial Line [phone]) without US guidance  Indication(s) Blood pressure monitoring and/or need for frequent ABGs  Consent Unable to obtain consent due to emergent nature of procedure.  Anesthesia None   Time Out Verified patient identification, verified procedure, site/side was marked, verified correct patient position, special equipment/implants available, medications/allergies/relevant history reviewed, required imaging and test results available.   Sterile Technique Maximal sterile technique including full sterile barrier drape, hand hygiene, sterile gown, sterile gloves, mask, hair covering, sterile ultrasound probe cover (if used).   Procedure Description Area of catheter insertion was cleaned with chlorhexidine and draped in sterile fashion. Without real-time ultrasound guidance an arterial catheter was placed into the right radial artery.  Appropriate arterial tracings confirmed on monitor.     Complications/Tolerance None; patient tolerated the procedure well.   EBL Minimal   Specimen(s) None

## 2021-12-09 NOTE — Progress Notes (Signed)
After getting consent for bronchoscopy from patient's wife, patient was given etomidate and rocuronium, he became hypotensive, Levophed dose was increased to 30 mics per minute arrest and he remained hypotensive, Levophed was increased to 50, vasopressin was added. Patient received 500 mics of phenylephrine but patient's blood pressure went down further, he lost his pulse, CPR was initiated per ACLS protocol and ROSC was achieved after 4 minutes of CPR and 2 A of epi  Of note bronchoscopy was not initiated.   Additional critical care time: 20 minutes  Performed by: [place] care time was exclusive of separately billable procedures and treating other patients.   Critical care was necessary to treat or prevent imminent or life-threatening deterioration.   Critical care was time spent personally by me on the following activities: development of treatment plan with patient and/or surrogate as well as nursing, discussions with consultants, evaluation of patient's response to treatment, examination of patient, obtaining history from patient or surrogate, ordering and performing treatments and interventions, ordering and review of laboratory studies, ordering and review of radiographic studies, pulse oximetry and re-evaluation of patient's condition.   Jacky Kindle, MD [facility] See Amion for pager If no response to pager, please call [phone] until 7pm After 7pm, Please call E-link [phone]

## 2021-12-09 NOTE — ED Provider Notes (Addendum)
[facility] EMERGENCY DEPARTMENT Provider Note   CSN: [id number] Arrival date & time: 12/09/21  0321     History  Chief Complaint  Patient presents with   Altered Mental Status    Jerry [name] is a 51 y.o. male.  The history is provided by the spouse, the EMS personnel and medical records.  Altered Mental Status Jerry [name] is a 51 y.o. male who presents to the Emergency Department complaining of sob.  He presents to the ED by EMS from home for evaluation of decreased oxygen levels and difficulty breathing.  He has a hx/o ALS and lives with wife.  He has a Triligy ventilator at home and has been intermittently able to comply with it.  He has been working with respiratory on adjusting settings over the last week and can only tolerate it for a few minutes at a time.  He has been experiencing increased difficulty breathing over the last twenty four hours as well as increased abdominal distension.  He has a PEG that was placed in April.  He has been feeding well with tube feeds but has not had any since yesterday afternoon due to bloating and belching.  Last BM today and was smooth/solid.  He is urinating well.  No reports of fever.  Communicates with blinking.  Today he was on the machine for longer (about an hour) and has been experiencing sweating episodes.    EMS reports Spo2 in the 60s.   Sxs are severe, constant, worsening.    Level V caveat due to nonverbal.       Home Medications Prior to Admission medications   Medication Sig Start Date End Date Taking? Authorizing Provider  EPINEPHrine (EPIPEN 2-PAK) 0.3 mg/0.3 mL IJ SOAJ injection Inject 0.3 mLs (0.3 mg total) into the muscle as needed for anaphylaxis. 07/19/19   [name], MD  hydrOXYzine (ATARAX) 10 MG/5ML syrup Place 5-10 mLs (10-20 mg total) into feeding tube at bedtime. 11/18/21   [name], MD  LORazepam (LORAZEPAM INTENSOL) 2 MG/ML concentrated solution Take 0.5 mLs (1 mg total) by mouth  daily as needed for anxiety. 11/18/21   [name], MD      Allergies    Otho Darner allergy] and Other    Review of Systems   Review of Systems  Unable to perform ROS: Other   Physical Exam Updated Vital Signs BP 97/61   Pulse (!) 149   Temp 97.7 F (36.5 C) (Temporal)   Resp 20   SpO2 97%  Physical Exam Vitals and nursing note reviewed.  Constitutional:      Appearance: He is well-developed. He is ill-appearing.  HENT:     Head: Normocephalic and atraumatic.  Cardiovascular:     Rate and Rhythm: Normal rate and regular rhythm.     Heart sounds: No murmur heard. Pulmonary:     Effort: No respiratory distress.     Comments: Decreased air movement bilaterally Abdominal:     General: There is distension.     Tenderness: There is abdominal tenderness. There is no guarding or rebound.     Comments: PEG tube in LUQ.    Musculoskeletal:        General: No swelling or tenderness.  Skin:    General: Skin is warm and dry.  Neurological:     Mental Status: He is alert.     Comments: Nonverbal.  Blinks to communicate.  0/5 in all four extremities.     ED Results / Procedures /  Treatments   Labs (all labs ordered are listed, but only abnormal results are displayed) Labs Reviewed  COMPREHENSIVE METABOLIC PANEL - Abnormal; Notable for the following components:      Result Value   Sodium 120 (*)    Chloride 74 (*)    Glucose, Bld 104 (*)    Creatinine, Ser 0.36 (*)    All other components within normal limits  I-STAT CHEM 8, ED - Abnormal; Notable for the following components:   Sodium 119 (*)    Chloride 75 (*)    BUN 22 (*)    Creatinine, Ser 0.40 (*)    Glucose, Bld 102 (*)    TCO2 37 (*)    All other components within normal limits  I-STAT VENOUS BLOOD GAS, ED - Abnormal; Notable for the following components:   pCO2, Ven 71.5 (*)    pO2, Ven 170 (*)    Bicarbonate 40.6 (*)    TCO2 43 (*)    Acid-Base Excess 11.0 (*)    Sodium 119 (*)    All other  components within normal limits  TROPONIN I (HIGH SENSITIVITY) - Abnormal; Notable for the following components:   Troponin I (High Sensitivity) 68 (*)    All other components within normal limits  CULTURE, BLOOD (ROUTINE X 2)  CULTURE, BLOOD (ROUTINE X 2)  CBC WITH DIFFERENTIAL/PLATELET  LACTIC ACID, PLASMA  LIPASE, BLOOD  LACTIC ACID, PLASMA  URINALYSIS, ROUTINE W REFLEX MICROSCOPIC  TROPONIN I (HIGH SENSITIVITY)    EKG EKG Interpretation  Date/Time:  Wednesday December 09 2021 04:00:26 EDT Ventricular Rate:  117 PR Interval:  115 QRS Duration: 86 QT Interval:  340 QTC Calculation: 475 R Axis:   101 Text Interpretation: Sinus tachycardia Right axis deviation Artifact in lead(s) I III aVR Confirmed by Quintella Reichert [phone]) on 12/09/2021 4:59:21 AM  Radiology CT Angio Chest PE W/Cm &/Or Wo Cm  Addendum Date: 12/09/2021   ADDENDUM REPORT: 12/09/2021 06:43 ADDENDUM: Case discussed with Dr. Ralene Bathe. A Foley catheter is not present and tubular low-density is presumably urine distending the urethra. Electronically Signed   By: Jorje Guild M.D.   On: 12/09/2021 06:43   Result Date: 12/09/2021 CLINICAL DATA:  Hypoxia tonight. Less responsive. Hypotension and tachycardia. History of ALS EXAM: CT ANGIOGRAPHY CHEST CT ABDOMEN AND PELVIS WITH CONTRAST TECHNIQUE: Multidetector CT imaging of the chest was performed using the standard protocol during bolus administration of intravenous contrast. Multiplanar CT image reconstructions and MIPs were obtained to evaluate the vascular anatomy. Multidetector CT imaging of the abdomen and pelvis was performed using the standard protocol during bolus administration of intravenous contrast. RADIATION DOSE REDUCTION: This exam was performed according to the departmental dose-optimization program which includes automated exposure control, adjustment of the mA and/or kV according to patient size and/or use of iterative reconstruction technique. CONTRAST:  3m  OMNIPAQUE IOHEXOL 350 MG/ML SOLN COMPARISON:  None Available. FINDINGS: CTA CHEST FINDINGS Cardiovascular: Satisfactory opacification of the pulmonary arteries to the segmental level. No evidence of pulmonary embolism. Normal heart size, although there may be left ventricular hypertrophy. No pericardial effusion. Mediastinum/Nodes: Negative for adenopathy or mass Lungs/Pleura: There is no edema, consolidation, effusion, or pneumothorax. Musculoskeletal: Cachexia appearance. Review of the MIP images confirms the above findings. CT ABDOMEN and PELVIS FINDINGS Hepatobiliary: Somewhat heterogeneous enhancement of the ventral liver, likely perfusional.No evidence of biliary obstruction or stone. Pancreas: Unremarkable. Spleen: Unremarkable. Adrenals/Urinary Tract: Negative adrenals. No hydronephrosis or stone. Unremarkable bladder. Stomach/Bowel: Diffuse gas dilated colon without volvulus. Stool  mainly in the rectum measuring up to 5.5 cm in diameter. Proximal colonic distension up to 9 cm diameter. No pneumatosis or perforation is seen. No small bowel obstruction. Located percutaneous gastrostomy tube. Vascular/Lymphatic: No acute vascular abnormality. No mass or adenopathy. Reproductive:There appears to be a Foley catheter with balloon inflated at the level of the posterior penile urethra where there is bulbous low-density appearance. Other: No ascites or pneumoperitoneum. Musculoskeletal: Cachectic appearance Review of the MIP images confirms the above findings. IMPRESSION: Chest CTA: No acute finding, including pulmonary embolism. Abdominal CT: 1. Diffuse gas dilated colon measuring up to 9 cm in diameter, consistent with adynamic ileus. 2. Rectal stool measuring up to 5.5 cm in diameter. 3. Suspect Foley catheter inflated in the penile urethra. Electronically Signed: By: Jorje Guild M.D. On: 12/09/2021 06:31   CT Abdomen Pelvis W Contrast  Addendum Date: 12/09/2021   ADDENDUM REPORT: 12/09/2021 06:43  ADDENDUM: Case discussed with Dr. Ralene Bathe. A Foley catheter is not present and tubular low-density is presumably urine distending the urethra. Electronically Signed   By: Jorje Guild M.D.   On: 12/09/2021 06:43   Result Date: 12/09/2021 CLINICAL DATA:  Hypoxia tonight. Less responsive. Hypotension and tachycardia. History of ALS EXAM: CT ANGIOGRAPHY CHEST CT ABDOMEN AND PELVIS WITH CONTRAST TECHNIQUE: Multidetector CT imaging of the chest was performed using the standard protocol during bolus administration of intravenous contrast. Multiplanar CT image reconstructions and MIPs were obtained to evaluate the vascular anatomy. Multidetector CT imaging of the abdomen and pelvis was performed using the standard protocol during bolus administration of intravenous contrast. RADIATION DOSE REDUCTION: This exam was performed according to the departmental dose-optimization program which includes automated exposure control, adjustment of the mA and/or kV according to patient size and/or use of iterative reconstruction technique. CONTRAST:  26m OMNIPAQUE IOHEXOL 350 MG/ML SOLN COMPARISON:  None Available. FINDINGS: CTA CHEST FINDINGS Cardiovascular: Satisfactory opacification of the pulmonary arteries to the segmental level. No evidence of pulmonary embolism. Normal heart size, although there may be left ventricular hypertrophy. No pericardial effusion. Mediastinum/Nodes: Negative for adenopathy or mass Lungs/Pleura: There is no edema, consolidation, effusion, or pneumothorax. Musculoskeletal: Cachexia appearance. Review of the MIP images confirms the above findings. CT ABDOMEN and PELVIS FINDINGS Hepatobiliary: Somewhat heterogeneous enhancement of the ventral liver, likely perfusional.No evidence of biliary obstruction or stone. Pancreas: Unremarkable. Spleen: Unremarkable. Adrenals/Urinary Tract: Negative adrenals. No hydronephrosis or stone. Unremarkable bladder. Stomach/Bowel: Diffuse gas dilated colon without volvulus.  Stool mainly in the rectum measuring up to 5.5 cm in diameter. Proximal colonic distension up to 9 cm diameter. No pneumatosis or perforation is seen. No small bowel obstruction. Located percutaneous gastrostomy tube. Vascular/Lymphatic: No acute vascular abnormality. No mass or adenopathy. Reproductive:There appears to be a Foley catheter with balloon inflated at the level of the posterior penile urethra where there is bulbous low-density appearance. Other: No ascites or pneumoperitoneum. Musculoskeletal: Cachectic appearance Review of the MIP images confirms the above findings. IMPRESSION: Chest CTA: No acute finding, including pulmonary embolism. Abdominal CT: 1. Diffuse gas dilated colon measuring up to 9 cm in diameter, consistent with adynamic ileus. 2. Rectal stool measuring up to 5.5 cm in diameter. 3. Suspect Foley catheter inflated in the penile urethra. Electronically Signed: By: JJorje GuildM.D. On: 12/09/2021 06:31   DG Chest Portable 1 View  Result Date: 12/09/2021 CLINICAL DATA:  Intubation EXAM: PORTABLE CHEST 1 VIEW COMPARISON:  12/09/2021 FINDINGS: New endotracheal tube with tip 17 mm above the carina. Right porta catheter with tip  at the right atrium. Artifact from EKG leads and defibrillator pads. Stable low volume chest with gas dilated colon in the upper abdomen. IMPRESSION: 1. New endotracheal tube with tip 17 mm above the carina. 2. Stable low volume but clear lungs. Electronically Signed   By: Jorje Guild M.D.   On: 12/09/2021 06:45   DG Chest Port 1 View  Result Date: 12/09/2021 CLINICAL DATA:  51 year old male with shortness of breath. EXAM: PORTABLE CHEST 1 VIEW COMPARISON:  None Available. FINDINGS: Portable AP semi upright view at 0355 hours. Dual lumen right chest power port is accessed. Normal cardiac size and mediastinal contours. Somewhat low lung volumes, especially on the left with elevated hemidiaphragm and abundant subjacent gas distended bowel loops. Percutaneous  gastrostomy tube is visible. No definite pneumoperitoneum. Allowing for portable technique the lungs are clear. No pneumothorax. Visualized tracheal air column is within normal limits. No acute osseous abnormality identified. IMPRESSION: 1. Low lung volumes and elevation of the left hemidiaphragm appear related to multiple gas distended bowel loops in the abdomen. Recommend follow-up Abdominal Series. 2.  No acute cardiopulmonary abnormality. 3. PEG tube and dual lumen right chest power port in place. Electronically Signed   By: Genevie Ann M.D.   On: 12/09/2021 04:10    Procedures Procedure Name: Intubation Date/Time: 12/09/2021 6:36 AM Performed by: Quintella Reichert, MD Pre-anesthesia Checklist: Patient identified, Emergency Drugs available and Suction available Oxygen Delivery Method: Ambu bag Induction Type: Rapid sequence Ventilation: Mask ventilation without difficulty Laryngoscope Size: Glidescope Tube size: 7.5 mm Number of attempts: 1 Placement Confirmation: ETT inserted through vocal cords under direct vision and Positive ETCO2 Secured at: 23 cm Tube secured with: ETT holder      CRITICAL CARE Performed by: Quintella Reichert   Total critical care time: 45 minutes  Critical care time was exclusive of separately billable procedures and treating other patients.  Critical care was necessary to treat or prevent imminent or life-threatening deterioration.  Critical care was time spent personally by me on the following activities: development of treatment plan with patient and/or surrogate as well as nursing, discussions with consultants, evaluation of patient's response to treatment, examination of patient, obtaining history from patient or surrogate, ordering and performing treatments and interventions, ordering and review of laboratory studies, ordering and review of radiographic studies, pulse oximetry and re-evaluation of patient's condition.  Medications Ordered in ED Medications   norepinephrine (LEVOPHED) '4mg'$  in 24m (0.016 mg/mL) premix infusion (15 mcg/min Intravenous Rate/Dose Change 12/09/21 0625)  fentaNYL 25016m in NS 25074m36m22ml) infusion-PREMIX (has no administration in time range)  fentaNYL 10 mcg/ml infusion (has no administration in time range)  fentaNYL (SUBLIMAZE) 100 MCG/2ML injection (has no administration in time range)  sodium chloride 0.9 % bolus 500 mL (0 mLs Intravenous Stopped 12/09/21 0443)  sodium chloride 0.9 % bolus 500 mL (0 mLs Intravenous Stopped 12/09/21 0541)  EPINEPHrine (ADRENALIN) 1 MG/10ML injection (1 mg Intravenous Given 12/09/21 0558)  iohexol (OMNIPAQUE) 350 MG/ML injection 80 mL (80 mLs Intravenous Contrast Given 12/09/21 0606)  etomidate (AMIDATE) injection (10 mg Intravenous Given 12/09/21 [id number] (ZEMURON) injection (60 mg Intravenous Given 12/09/21 0608)  fentaNYL (SUBLIMAZE) injection 50 mcg (50 mcg Intravenous Given 12/09/21 0617)    ED Course/ Medical Decision Making/ A&P                           Medical Decision Making Amount and/or Complexity of Data Reviewed Labs: ordered. Radiology: ordered.  Risk Prescription drug management. Decision regarding hospitalization.   Pt with ALS on home triligy here with hypoxia on home vent setting as well as abdominal distension.  His PEG tube was placed to suction on ED arrival due to significant distension.  CXR with distended bowel.  Pt with hypotension treated with IVF bolus, quickly improved.  Labs significant for hyponatremia, elevated troponin.  EKG without acute ischemic changes.  Hypoxia likely secondary to hypoventilation secondary to ALS, complicated by abdominal distension.  Given abdominal distension concern for perf vs SBO plan to obtain CT.  Given troponin elevation, hypoxia plan to obtain CT PE.  Pt was able to receive CT scans but called to scanner for event where pt became hypoxic, bradycardic and unresponsive.  He was pulseless at that time and  received two  rounds of CPR with epi, rhythm of PEA on monitor.  He received BVM ventilation during event and had ROSC.  Suspect hypoxic event resulted in arrest.  Pt intubated for ongoing respiratory support.  CT neg for PE, demonstrates gaseous distension c/w ileus.  Post intubation pt on fentanyl for sedation, levophed for BP support.  Additional OG placed to assist with gastric decompression.  Patient's wife updated of ED course and plan to admit for ongoing care.  Critical care consulted for admission.         Final Clinical Impression(s) / ED Diagnoses Final diagnoses:  Respiratory arrest ([place])  Hyponatremia  Ileus [facility])    Rx / [place] Orders ED Discharge Orders     None         Quintella Reichert, MD 12/09/21 Drema Halon    Quintella Reichert, MD 12/22/21 [phone]

## 2021-12-09 NOTE — Progress Notes (Signed)
MD at bedside preparing to bronchoscopy, pt became hypotensive and tachycardic. CODE blue was called at 1024. ROSC was achieved at 1029. Family updated per Tacy Learn MD

## 2021-12-09 NOTE — Progress Notes (Signed)
Chaplain responded to Code [employer]. Chaplain established relationship of care and concern for family.  Chaplain continued in attendance with family, escorting pt's wife and mother and then other family members back to visit.   [place]

## 2021-12-09 NOTE — Code Documentation (Signed)
IV team in attendance

## 2021-12-09 NOTE — Procedures (Signed)
Bronchoscopy Procedure Note  Jerry [name]  [id number]  09/07/1970  Date:12/09/21  Time:11:34 AM   Provider Performing:Cindy Brindisi   Procedure(s):  Flexible bronchoscopy with bronchial alveolar lavage (42395)  Indication(s) Acute respiratory failure  Consent Risks of the procedure as well as the alternatives and risks of each were explained to the patient and/or caregiver.  Consent for the procedure was obtained and is signed in the bedside chart  Anesthesia Etomidate and rocuronium   Time Out Verified patient identification, verified procedure, site/side was marked, verified correct patient position, special equipment/implants available, medications/allergies/relevant history reviewed, required imaging and test results available.   Sterile Technique Usual hand hygiene, masks, gowns, and gloves were used   Procedure Description Bronchoscope advanced through endotracheal tube and into airway.  Airways were examined down to subsegmental level with findings noted below.   Following diagnostic evaluation, BAL(s) performed in right lower lobe/left lower lobe with normal saline and return of frothy fluid  Findings: Erythematous airway throughout, scant thin secretions   Complications/Tolerance None; patient tolerated the procedure well. Chest X-ray is needed post procedure.   EBL Minimal   Specimen(s) Sputum/BAL fluid

## 2021-12-09 NOTE — Procedures (Signed)
Cardiopulmonary Resuscitation Note  Jerry [name]  [id number]  [birth year]  Date:12/09/21  Time:10:39 AM   Provider Performing:Taleyah Hillman   Procedure: Cardiopulmonary Resuscitation [phone])  Indication(s) Loss of Pulse  Consent N/A  Anesthesia N/A   Time Out N/A   Sterile Technique Hand hygiene, gloves   Procedure Description Called to patient's room for CODE BLUE. Initial rhythm was PEA/Asystole. Patient received high quality chest compressions for 4 minutes with defibrillation or cardioversion when appropriate. Epinephrine was administered every 3 minutes as directed by time Therapist, nutritional. Additional pharmacologic interventions included phenylephrine and vasopressin. Additional procedural interventions include intra-osseus line.  Return of spontaneous circulation was achieved.  Family called and notified.   Complications/Tolerance N/A   EBL N/A   Specimen(s) N/A  Estimated time to ROSC: 4 minutes

## 2021-12-09 NOTE — Progress Notes (Signed)
Contininued support to family and escorted  family and friends to  ED bedside. Pt. going to 53M5. Escorted family to  [address].  Provided emotional and spiritual support.   Jaclynn Major, [place], [facility] Ed, Pager [phone]

## 2021-12-09 NOTE — ED Notes (Signed)
Pt taken off NRB and placed on 2L [place]

## 2021-12-09 NOTE — Progress Notes (Signed)
  Transition of Care [facility]) Screening Note   Patient Details  Name: Jerry [name] Date of Birth: 03-10-1971   Transition of Care [facility]) CM/SW Contact:    Tom-Johnson, Renea Ee, RN Phone Number: 12/09/2021, 4:43 PM  Patient is admitted for Septic Shock. From home with wife.  Has hx of ALS, and is wheelchair-bound. Currently intubated.    Transition of Care Department [facility]) has reviewed patient and no TOC needs or recommendations have been identified at this time. TOC will continue to monitor patient advancement through interdisciplinary progression rounds. If new patient transition needs arise, please place a TOC consult.

## 2021-12-09 NOTE — ED Triage Notes (Signed)
Pt comes from home via [facility] EMS, pt is vent dependent due to ALS, tonight oxygen dropped to 60's and pt was altered less responsive, pt is normally nonverbal and communicates with blinking. Pt was hypotensive and tachycardic, recent stomach bloating and recent PEG tube placement.

## 2021-12-09 NOTE — Procedures (Signed)
Intraosseous Needle Insertion Procedure Note    Date:12/09/21  Time:10:40 AM   Provider Performing:Sarahjane Matherly   Procedure: Insertion Intraosseous (61443)  Indication(s) Medication administration  Consent Unable to obtain consent due to emergent nature of procedure.  Anesthesia Topical only with 1% lidocaine   Timeout Verified patient identification, verified procedure, site/side was marked, verified correct patient position, special equipment/implants available, medications/allergies/relevant history reviewed, required imaging and test results available.  Procedure Description Area of needle insertion was cleaned with chlorhexidine. Intraosseous needle was placed into the left tibia. Bone marrow was aspirated and site easily flushed. The needle was secured in place and dressing applied.  Complications/Tolerance None; patient tolerated the procedure well.  EBL Minimal

## 2021-12-09 NOTE — Progress Notes (Signed)
Pharmacy Antibiotic Note  Jerry [name] is a 51 y.o. male admitted on 12/09/2021 with acute on chronic respiratory failure.  Patient is wheelchair bound and on a chronic vent from ALS.  Pharmacy has been consulted for Unasyn dosing for aspiration PNA.    SCr 0.4, CrCL 67 ml/min, afebrile, WBC WNL  Plan: Unasyn 1.5gm IV Q6H Pharmacy will sign off with stable renal fxn.  Thank you for the consult!  Weight: 42.6 kg (94 lb)  Temp (24hrs), Avg:97.7 F (36.5 C), Min:97.7 F (36.5 C), Max:97.7 F (36.5 C)  Recent Labs  Lab 12/09/21 0400 12/09/21 0517  WBC 8.1  --   CREATININE 0.36* 0.40*  LATICACIDVEN 1.5  --      Estimated Creatinine Clearance: 67.3 mL/min (A) (by C-G formula based on SCr of 0.4 mg/dL (L)).    Allergies  Allergen Reactions   Crab [Shellfish Allergy] Rash   Other Rash    Unasyn 6/7 >>    6/7 BCx -   Jerry [name] D. Mina Marble, PharmD, BCPS, [place] 12/09/2021, 9:00 AM

## 2021-12-09 NOTE — Progress Notes (Deleted)
Pharmacy Antibiotic Note  Jerry [name] is a 51 y.o. male admitted on 12/09/2021 with acute on chronic respiratory failure.  Patient is wheelchair bound and on a chronic vent from ALS.  Pharmacy has been consulted for Unasyn dosing for aspiration PNA.    SCr 0.4, CrCL 67 ml/min, afebrile, WBC WNL  Plan: Unasyn 1.5gm IV Q6H Monitor renal fxn, clinical progress  Weight: 42.6 kg (94 lb)  Temp (24hrs), Avg:97.7 F (36.5 C), Min:97.7 F (36.5 C), Max:97.7 F (36.5 C)  Recent Labs  Lab 12/09/21 0400 12/09/21 0517  WBC 8.1  --   CREATININE 0.36* 0.40*  LATICACIDVEN 1.5  --     Estimated Creatinine Clearance: 67.3 mL/min (A) (by C-G formula based on SCr of 0.4 mg/dL (L)).    Allergies  Allergen Reactions   Crab [Shellfish Allergy] Rash   Other Rash    Unasyn 6/7 >>    6/7 BCx -   Jerry [name] D. Mina Marble, PharmD, BCPS, [place] 12/09/2021, 9:00 AM

## 2021-12-09 NOTE — H&P (Signed)
NAME:  Jerry [name], MRN:  [id number], DOB:  [birth year], LOS: 0 ADMISSION DATE:  12/09/2021, CONSULTATION DATE: 12/09/2021 REFERRING MD: Emergency department physician, CHIEF COMPLAINT: Acute on chronic respiratory failure  History of Present Illness:  51 year old male with known ALS which is progressed over the last year to the point that he is wheelchair-bound and is having increasing respiratory distress over the last few weeks and presented to the emergency room 12/09/2021 required urgent intubation.  The patient and family have discussed the possibility of a tracheostomy and want to proceed with that option.  He was admitted to the intensive care unit for further evaluation and treatment.  Pertinent  Medical History   Past Medical History:  Diagnosis Date   ALS (amyotrophic lateral sclerosis) ([place])    COVID-19 virus infection 06/2020   Meningitis 2003   spinal   Morbid obesity ([place])    Prediabetes 11/05/2016   A1C 5.7 on 11/05/16     Significant Hospital Events: Including procedures, antibiotic start and stop dates in addition to other pertinent events   12/09/2021 intubated  Interim History / Subjective:  51 year old with known progressive ALS wheelchair bound for the last year and refractory to current interventions and intubated today for increasing respiratory distress.  Objective   Blood pressure 113/83, pulse (!) 123, temperature 97.7 F (36.5 C), temperature source Temporal, resp. rate 18, SpO2 100 %.    Vent Mode: PRVC FiO2 (%):  [40 %-100 %] 40 % Set Rate:  [18 bmp] 18 bmp Vt Set:  [550 mL] 550 mL PEEP:  [5 cmH20] 5 cmH20 Plateau Pressure:  [19 cmH20-22 cmH20] 19 cmH20   Intake/Output Summary (Last 24 hours) at 12/09/2021 0803 Last data filed at 12/09/2021 0541 Gross per 24 hour  Intake 1000 ml  Output --  Net 1000 ml   There were no vitals filed for this visit.  Examination: General: Frail cachectic male who is currently on full mechanical [place] support HENT: No  JVD or lymphadenopathy is appreciated Lungs: Coarse rhonchi throughout Cardiovascular: Heart sounds regular sinus tach Abdomen: Positive bowel sounds PEG tube in place Extremities: Lower extremities are cool Neuro: Unable to move except for blinking eyes to command GU: Will need a Foley catheter  Resolved Hospital Problem list     Assessment & Plan:  Acute on chronic respiratory failure secondary to progressive neuromuscular disease i.e. ALS requiring intubation on 12/09/2021 and he will need tracheostomy to maintain proper ventilatory status in the future. Admit to intensive care unit Ventilator changes as needed Pulmonary toilet Plan for tracheostomy in the near future per family request  Hypotension Most likely multifactorial with sedation for intubation suspected dehydration IV fluids Levophed and wean as tolerated  Advance ALS Continue to monitor  Bedsore buttock Monitor Questionable wound ostomy care .  Best Practice (right click and "Reselect all SmartList Selections" daily)   Diet/type: tubefeeds DVT prophylaxis: LMWH GI prophylaxis: PPI Lines: Central line Foley:  Yes, and it is still needed Code Status:  full code Last date of multidisciplinary goals of care discussion [TBD]  Labs   CBC: Recent Labs  Lab 12/09/21 0400 12/09/21 0517 12/09/21 0640  WBC 8.1  --   --   NEUTROABS 6.6  --   --   HGB 15.1 16.0  16.3 13.6  HCT 43.0 47.0  48.0 40.0  MCV 86.0  --   --   PLT 234  --   --     Basic Metabolic Panel: Recent Labs  Lab 12/09/21  0400 12/09/21 0517 12/09/21 0640  NA 120* 119*  119* 121*  K 4.0 3.9  3.8 3.1*  CL 74* 75*  --   CO2 32  --   --   GLUCOSE 104* 102*  --   BUN 18 22*  --   CREATININE 0.36* 0.40*  --   CALCIUM 9.9  --   --    GFR: CrCl cannot be calculated (Unknown ideal weight.). Recent Labs  Lab 12/09/21 0400  WBC 8.1  LATICACIDVEN 1.5    Liver Function Tests: Recent Labs  Lab 12/09/21 0400  AST 28  ALT 32   ALKPHOS 53  BILITOT 0.7  PROT 6.8  ALBUMIN 4.2   Recent Labs  Lab 12/09/21 0400  LIPASE 31   No results for input(s): AMMONIA in the last 168 hours.  ABG    Component Value Date/Time   PHART 7.473 (H) 12/09/2021 0640   PCO2ART 48.8 (H) 12/09/2021 0640   PO2ART 370 (H) 12/09/2021 0640   HCO3 36.2 (H) 12/09/2021 0640   TCO2 38 (H) 12/09/2021 0640   O2SAT 100 12/09/2021 0640     Coagulation Profile: No results for input(s): INR, PROTIME in the last 168 hours.  Cardiac Enzymes: No results for input(s): CKTOTAL, CKMB, CKMBINDEX, TROPONINI in the last 168 hours.  HbA1C: Hgb A1c MFr Bld  Date/Time Value Ref Range Status  10/03/2018 10:37 AM 6.0 (H) <5.7 % of total Hgb Final    Comment:    For someone without known diabetes, a hemoglobin  A1c value between 5.7% and 6.4% is consistent with prediabetes and should be confirmed with a  follow-up test. . For someone with known diabetes, a value <7% indicates that their diabetes is well controlled. A1c targets should be individualized based on duration of diabetes, age, comorbid conditions, and other considerations. . This assay result is consistent with an increased risk of diabetes. . Currently, no consensus exists regarding use of hemoglobin A1c for diagnosis of diabetes for children. .   04/28/2018 04:29 PM 6.0 (H) <5.7 % of total Hgb Final    Comment:    For someone without known diabetes, a hemoglobin  A1c value between 5.7% and 6.4% is consistent with prediabetes and should be confirmed with a  follow-up test. . For someone with known diabetes, a value <7% indicates that their diabetes is well controlled. A1c targets should be individualized based on duration of diabetes, age, comorbid conditions, and other considerations. . This assay result is consistent with an increased risk of diabetes. . Currently, no consensus exists regarding use of hemoglobin A1c for diagnosis of diabetes for children. .      CBG: No results for input(s): GLUCAP in the last 168 hours.  Review of Systems:   Na   Past Medical History:  He,  has a past medical history of ALS (amyotrophic lateral sclerosis) ([place]), COVID-19 virus infection (06/2020), Meningitis (2003), Morbid obesity ([place]), and Prediabetes (11/05/2016).   Surgical History:   Past Surgical History:  Procedure Laterality Date   MASS EXCISION Right 11/26/2014   Procedure: EXCISION MASS/GROIN EXCISION MASS;  Surgeon: Robert Bellow, MD;  Location: [facility] ORS;  Service: General;  Laterality: Right;   MASS EXCISION Right 11/26/2014   Procedure: Juergen [place] EXCISION OF MASS/POST THIGH MASS;  Surgeon: Robert Bellow, MD;  Location: [facility] ORS;  Service: General;  Laterality: Right;   PEG PLACEMENT  10/27/2021   PORTACATH PLACEMENT Right 06/19/2019     Social History:   reports that he  has never smoked. He has never used smokeless tobacco. He reports that he does not drink alcohol and does not use drugs.   Family History:  His family history includes Diabetes in his brother, father, mother, sister, sister, and sister; Fibromyalgia in his sister; Hypertension in his sister.   Allergies Allergies  Allergen Reactions   Crab [Shellfish Allergy] Rash   Other Rash     Home Medications  Prior to Admission medications   Medication Sig Start Date End Date Taking? Authorizing Provider  EPINEPHrine (EPIPEN 2-PAK) 0.3 mg/0.3 mL IJ SOAJ injection Inject 0.3 mLs (0.3 mg total) into the muscle as needed for anaphylaxis. 07/19/19   [name], MD  hydrOXYzine (ATARAX) 10 MG/5ML syrup Place 5-10 mLs (10-20 mg total) into feeding tube at bedtime. 11/18/21   [name], MD  LORazepam (LORAZEPAM INTENSOL) 2 MG/ML concentrated solution Take 0.5 mLs (1 mg total) by mouth daily as needed for anxiety. 11/18/21   [name], MD     Critical care time: 84 min    [name] Landry Kamiyah Kindel ACNP Acute Care Nurse Practitioner [place] Please  consult Amion 12/09/2021, 8:04 AM

## 2021-12-09 NOTE — Code Documentation (Signed)
RR RN in attendance

## 2021-12-10 ENCOUNTER — Inpatient Hospital Stay (HOSPITAL_COMMUNITY): Payer: Medicare HMO

## 2021-12-10 DIAGNOSIS — J9602 Acute respiratory failure with hypercapnia: Secondary | ICD-10-CM | POA: Diagnosis not present

## 2021-12-10 DIAGNOSIS — R6521 Severe sepsis with septic shock: Secondary | ICD-10-CM | POA: Diagnosis not present

## 2021-12-10 DIAGNOSIS — I469 Cardiac arrest, cause unspecified: Secondary | ICD-10-CM | POA: Diagnosis not present

## 2021-12-10 DIAGNOSIS — A419 Sepsis, unspecified organism: Secondary | ICD-10-CM | POA: Diagnosis not present

## 2021-12-10 DIAGNOSIS — J9601 Acute respiratory failure with hypoxia: Secondary | ICD-10-CM | POA: Diagnosis not present

## 2021-12-10 DIAGNOSIS — G1221 Amyotrophic lateral sclerosis: Secondary | ICD-10-CM | POA: Diagnosis not present

## 2021-12-10 LAB — BASIC METABOLIC PANEL
Anion gap: 13 (ref 5–15)
BUN: 6 mg/dL (ref 6–20)
CO2: 26 mmol/L (ref 22–32)
Calcium: 8.6 mg/dL — ABNORMAL LOW (ref 8.9–10.3)
Chloride: 92 mmol/L — ABNORMAL LOW (ref 98–111)
Creatinine, Ser: 0.31 mg/dL — ABNORMAL LOW (ref 0.61–1.24)
GFR, Estimated: >60 mL/min (ref >60)
Glucose, Bld: 106 mg/dL — ABNORMAL HIGH (ref 70–99)
Potassium: 2.8 mmol/L — ABNORMAL LOW (ref 3.5–5.1)
Sodium: 131 mmol/L — ABNORMAL LOW (ref 135–145)

## 2021-12-10 LAB — COMPREHENSIVE METABOLIC PANEL
ALT: 36 U/L (ref 0–44)
AST: 29 U/L (ref 15–41)
Albumin: 3.2 g/dL — ABNORMAL LOW (ref 3.5–5.0)
Alkaline Phosphatase: 44 U/L (ref 38–126)
Anion gap: 15 (ref 5–15)
BUN: 10 mg/dL (ref 6–20)
CO2: 27 mmol/L (ref 22–32)
Calcium: 8.8 mg/dL — ABNORMAL LOW (ref 8.9–10.3)
Chloride: 86 mmol/L — ABNORMAL LOW (ref 98–111)
Creatinine, Ser: 0.35 mg/dL — ABNORMAL LOW (ref 0.61–1.24)
GFR, Estimated: >60 mL/min (ref >60)
Glucose, Bld: 153 mg/dL — ABNORMAL HIGH (ref 70–99)
Potassium: 2.3 mmol/L — CL (ref 3.5–5.1)
Sodium: 128 mmol/L — ABNORMAL LOW (ref 135–145)
Total Bilirubin: 1.1 mg/dL (ref 0.3–1.2)
Total Protein: 5.5 g/dL — ABNORMAL LOW (ref 6.5–8.1)

## 2021-12-10 LAB — GLUCOSE, CAPILLARY
Glucose-Capillary: 117 mg/dL — ABNORMAL HIGH (ref 70–99)
Glucose-Capillary: 126 mg/dL — ABNORMAL HIGH (ref 70–99)
Glucose-Capillary: 98 mg/dL (ref 70–99)
Glucose-Capillary: 114 mg/dL — ABNORMAL HIGH (ref 70–99)
Glucose-Capillary: 103 mg/dL — ABNORMAL HIGH (ref 70–99)
Glucose-Capillary: 98 mg/dL (ref 70–99)

## 2021-12-10 LAB — ECHOCARDIOGRAM COMPLETE
Area-P 1/2: 3.42 cm2
S' Lateral: 2.5 cm
Weight: 1504 oz

## 2021-12-10 LAB — CBC
HCT: 33.7 % — ABNORMAL LOW (ref 39.0–52.0)
Hemoglobin: 12.3 g/dL — ABNORMAL LOW (ref 13.0–17.0)
MCH: 30.3 pg (ref 26.0–34.0)
MCHC: 36.5 g/dL — ABNORMAL HIGH (ref 30.0–36.0)
MCV: 83 fL (ref 80.0–100.0)
Platelets: 220 10*3/uL (ref 150–400)
RBC: 4.06 MIL/uL — ABNORMAL LOW (ref 4.22–5.81)
RDW: 13.5 % (ref 11.5–15.5)
WBC: 10.2 10*3/uL (ref 4.0–10.5)
nRBC: 0 % (ref 0.0–0.2)

## 2021-12-10 LAB — MAGNESIUM
Magnesium: 1.8 mg/dL (ref 1.7–2.4)
Magnesium: 2 mg/dL (ref 1.7–2.4)
Magnesium: 2.1 mg/dL (ref 1.7–2.4)

## 2021-12-10 LAB — PHOSPHORUS
Phosphorus: 1.8 mg/dL — ABNORMAL LOW (ref 2.5–4.6)
Phosphorus: 1.9 mg/dL — ABNORMAL LOW (ref 2.5–4.6)

## 2021-12-10 LAB — PROTIME-INR
INR: 1.4 — ABNORMAL HIGH (ref 0.8–1.2)
Prothrombin Time: 17 seconds — ABNORMAL HIGH (ref 11.4–15.2)

## 2021-12-10 LAB — PROCALCITONIN: Procalcitonin: 0.35 ng/mL

## 2021-12-10 LAB — TROPONIN I (HIGH SENSITIVITY): Troponin I (High Sensitivity): 84 ng/L — ABNORMAL HIGH (ref <18)

## 2021-12-10 MED ORDER — OSMOLITE 1.2 CAL PO LIQD
1000.0000 mL | ORAL | Status: DC
  Administered 2021-12-10 – 2021-12-15 (×6): 1000 mL
  Filled 2021-12-10 (×11): qty 1000

## 2021-12-10 MED ORDER — POTASSIUM CHLORIDE 20 MEQ PO PACK
40.0000 meq | PACK | ORAL | Status: AC
  Administered 2021-12-10 (×2): 40 meq
  Filled 2021-12-10 (×2): qty 2

## 2021-12-10 MED ORDER — POTASSIUM CHLORIDE 20 MEQ PO PACK
40.0000 meq | PACK | ORAL | Status: AC
Start: 2021-12-10 — End: 2021-12-10
  Administered 2021-12-10 (×2): 40 meq
  Filled 2021-12-10 (×2): qty 2

## 2021-12-10 MED ORDER — POTASSIUM CHLORIDE CRYS ER 20 MEQ PO TBCR: 40.0000 meq | EXTENDED_RELEASE_TABLET | Freq: Once | ORAL | Status: DC

## 2021-12-10 MED ORDER — POTASSIUM CHLORIDE 10 MEQ/50ML IV SOLN
10.0000 meq | INTRAVENOUS | Status: AC
  Administered 2021-12-10 (×4): 10 meq via INTRAVENOUS
  Filled 2021-12-10 (×4): qty 50

## 2021-12-10 MED ORDER — MAGNESIUM SULFATE 2 GM/50ML IV SOLN
2.0000 g | Freq: Once | INTRAVENOUS | Status: AC
  Administered 2021-12-10: 2 g via INTRAVENOUS
  Filled 2021-12-10: qty 50

## 2021-12-10 NOTE — Progress Notes (Signed)
  Echocardiogram 2D Echocardiogram has been performed.  Fidel Levy 12/10/2021, 1:48 PM

## 2021-12-10 NOTE — Progress Notes (Signed)
[place] Progress Note Patient Name: Jerry [name] DOB: 08/22/1970 MRN: [id number]   Date of Service  12/10/2021  HPI/Events of Note  Hypokalemia - K+ = 2.3 and Creatinine = 0.35.  eICU Interventions  Plan: Will replace K+. Repeat BMP at 12 noon.     Intervention Category Major Interventions: Electrolyte abnormality - evaluation and management  Beth Spackman [place] 12/10/2021, 4:08 AM

## 2021-12-10 NOTE — Progress Notes (Signed)
NAME:  Jerry [name], MRN:  [id number], DOB:  1970/09/28, LOS: 1 ADMISSION DATE:  12/09/2021, CONSULTATION DATE: 12/09/2021 REFERRING MD: Emergency department physician, CHIEF COMPLAINT: Acute on chronic respiratory failure  History of Present Illness:  51 year old male with known ALS which is progressed over the last year to the point that he is wheelchair-bound and is having increasing respiratory distress over the last few weeks and presented to the emergency room 12/09/2021 required urgent intubation.  The patient and family have discussed the possibility of a tracheostomy and want to proceed with that option.  He was admitted to the intensive care unit for further evaluation and treatment.  Pertinent  Medical History   Past Medical History:  Diagnosis Date   ALS (amyotrophic lateral sclerosis) ([place])    COVID-19 virus infection 06/2020   Meningitis 2003   spinal   Morbid obesity ([place])    Prediabetes 11/05/2016   A1C 5.7 on 11/05/16     Significant Hospital Events: Including procedures, antibiotic start and stop dates in addition to other pertinent events   12/09/2021 intubated. Reportedly brief code in the ED with rapid response RN. Arrived in the ICU. Became hypotensive despite vasopressor support. Lost pulse again and CPR performed with ROSC. Bronchoscopy with erythematous airways with scant thin secretions  Interim History / Subjective:  Arrived in the ICU yesterday. Became hypotensive despite vasopressor support. Lost pul and CPR performed with ROSC. Bronchoscopy with erythematous airways with scant thin secretions  This morning he is on minimal vent settings and opening eyes. Levophed improved to 12 mcg/min. Friend at bedside.  Objective   Blood pressure 110/72, pulse 89, temperature 98.8 F (37.1 C), temperature source Oral, resp. rate 18, weight 42.6 kg, SpO2 100 %.    Vent Mode: PRVC FiO2 (%):  [30 %-60 %] 30 % Set Rate:  [18 bmp] 18 bmp Vt Set:  [550 mL] 550 mL PEEP:  [5  cmH20-10 cmH20] 5 cmH20 Plateau Pressure:  [19 cmH20-23 cmH20] 19 cmH20   Intake/Output Summary (Last 24 hours) at 12/10/2021 0845 Last data filed at 12/10/2021 0748 Gross per 24 hour  Intake 5469.16 ml  Output 2130 ml  Net 3339.16 ml   Filed Weights   12/09/21 0800  Weight: 42.6 kg   Physical Exam: General: Chronically ill-appearing, no acute distress HENT: [place], AT, ETT in place Eyes: EOMI, no scleral icterus Respiratory: Diminished air entry with coarse breath sounds bilaterally.  No crackles, wheezing or rales Cardiovascular: RRR, -M/R/G, no JVD GI: BS+, soft, nontender, PEG Extremities:-Edema,-tenderness Neuro: Intermittently opens eyes to voice, no tracking  Resolved Hospital Problem list     Assessment & Plan:  Acute on chronic respiratory failure secondary to progressive neuromuscular disease  ALS on home NIV Possible aspiration pneumonitis -Full vent support -LTVV, 4-8cc/kg IBW with goal Pplat<30 and DP<15 -SBT/WUA daily but will be vent dependent in setting of NMD -Pulmonary toilet -When stable, family wishes to proceed with tracheostomy  Hypotension Most likely multifactorial with sedation for intubation suspected dehydration. Cannot rule out infection - possible aspiration peri-code -IV fluids -Wean levophed for MAP >65 -Continue antibiotics -Echocardiogram -Stress steroids  S/p in-hospital cardiac arrest -Tele -Trend trop -Echo  Hypokalemia Replete F/u PM BMET  Bedsore buttock, POA Monitor Questionable wound ostomy care .  Best Practice (right click and "Reselect all SmartList Selections" daily)   Diet/type: tubefeeds DVT prophylaxis: LMWH GI prophylaxis: PPI Lines: Central line Foley:  Yes, and it is still needed Code Status:  full code Last date of multidisciplinary  goals of care discussion [TBD]   Critical care time: 50 min    The patient is critically ill with multiple organ systems failure and requires high complexity decision  making for assessment and support, frequent evaluation and titration of therapies, application of advanced monitoring technologies and extensive interpretation of multiple databases.  Independent Critical Care Time: 50 Minutes.   Rodman Pickle, M.D. [facility] Pulmonary/Critical Care Medicine 12/10/2021 8:45 AM   Please see Amion for pager number to reach on-call Pulmonary and Critical Care Team.

## 2021-12-10 NOTE — Progress Notes (Signed)
Initial Nutrition Assessment  DOCUMENTATION CODES:   Underweight, suspect malnutrition  INTERVENTION:   Initiate tube feeds via PEG: - Start Osmolite 1.2 @ 20 ml/hr and advance by 10 ml q 6 hours to goal rate of 60 ml/hr (1440 ml/day)  Tube feeding regimen at goal rate provides 1728 kcal, 80 grams of protein, and 1181 ml of H2O.  Monitor magnesium, potassium, and phosphorus BID for at least 3 days, MD to replete as needed, as pt is at risk for refeeding syndrome given underweight BMI, likely malnutrition.  NUTRITION DIAGNOSIS:   Inadequate oral intake related to inability to eat as evidenced by NPO status.  GOAL:   Patient will meet greater than or equal to 90% of their needs  MONITOR:   Vent status, Labs, Weight trends, TF tolerance  REASON FOR ASSESSMENT:   Ventilator, Consult Enteral/tube feeding initiation and management  ASSESSMENT:   51 year old male who presented to the ED on 6/07 with AMS. PMH of ALS which has progressed since last year on home NIV, wheelchair-bound at baseline. Pt admitted with septic shock due to bilateral multifocal PNA, ileus.  06/07 - Code Blue, s/p bronchoscopy  RD working remotely.  RD consulted for enteral nutrition initiation and management. Per CCM note, family wishes to proceed with tracheostomy when pt is stable.  Pt with both PEG tube (present on admission) and OG tube in place per RN. PEG has been to LIWS. Noted CT from yesterday mentions possible colonic ileus. Per RN, abdominal distention is improved today. Will start tube feeds at trickle rate and slowly advance to goal.  Unsure of pt's home TF regimen as it is not listed in home meds. Will attempt to obtain more information on follow-up.  Unable to obtain weight history at this time. Reviewed weight history in chart. If weights are accurate, pt experienced a weight loss of 82.1 kg on 10/03/20 to 29.6 kg on 11/18/21. Current weight documented as 42.6 kg (94 lbs) but appears to be  stated rather than measured. Recommend obtaining updated measured weight.  Given underweight BMI, suspect pt with some degree of malnutrition but unable to confirm without NFPE. Will complete at follow-up.  Patient is currently intubated on ventilator support MV: 9.5 L/min Temp (24hrs), Avg:98.6 F (37 C), Min:97.6 F (36.4 C), Max:99.4 F (37.4 C) BP (a-line): 126/68 MAP (a-line): 82  Drips: Fentanyl: off this AM Levophed NS: 125 ml/hr  Medications reviewed and include: IV solu-medrol, SSI q 4 hours, IV abx  Labs reviewed: sodium 128, potassium 2.3 (pt received klor-con 40 mEq x 2), creatinine 0.35 CBG's: 100-192 x 24 hours  UOP: 1830 ml x 24 hours I/O's: +4.6 L since admit  NUTRITION - FOCUSED PHYSICAL EXAM:  Unable to complete at this time. RD working remotely.  Diet Order:   Diet Order             Diet NPO time specified  Diet effective now                   EDUCATION NEEDS:   No education needs have been identified at this time  Skin:  Skin Assessment: Reviewed RN Assessment (abrasion to bilateral buttocks)  Last BM:  12/09/21 medium type 1  Height:   Ht Readings from Last 1 Encounters:  11/18/21 '5\' 5"'$  (1.651 m)    Weight:   Wt Readings from Last 1 Encounters:  12/09/21 42.6 kg    BMI:  Body mass index is 15.64 kg/m.  Estimated Nutritional Needs:  Kcal:  1600-1800  Protein:  70-85 grams  Fluid:  1.6-1.8 L    Gustavus Bryant, MS, RD, LDN Inpatient Clinical Dietitian Please see AMiON for contact information.

## 2021-12-11 DIAGNOSIS — A419 Sepsis, unspecified organism: Secondary | ICD-10-CM | POA: Diagnosis not present

## 2021-12-11 DIAGNOSIS — K567 Ileus, unspecified: Secondary | ICD-10-CM | POA: Diagnosis not present

## 2021-12-11 DIAGNOSIS — E871 Hypo-osmolality and hyponatremia: Secondary | ICD-10-CM | POA: Diagnosis not present

## 2021-12-11 DIAGNOSIS — R6521 Severe sepsis with septic shock: Secondary | ICD-10-CM | POA: Diagnosis not present

## 2021-12-11 DIAGNOSIS — R092 Respiratory arrest: Secondary | ICD-10-CM | POA: Diagnosis not present

## 2021-12-11 LAB — BASIC METABOLIC PANEL
Anion gap: 10 (ref 5–15)
BUN: 8 mg/dL (ref 6–20)
CO2: 24 mmol/L (ref 22–32)
Calcium: 8.6 mg/dL — ABNORMAL LOW (ref 8.9–10.3)
Chloride: 99 mmol/L (ref 98–111)
Creatinine, Ser: 0.32 mg/dL — ABNORMAL LOW (ref 0.61–1.24)
GFR, Estimated: >60 mL/min (ref >60)
Glucose, Bld: 137 mg/dL — ABNORMAL HIGH (ref 70–99)
Potassium: 3.1 mmol/L — ABNORMAL LOW (ref 3.5–5.1)
Sodium: 133 mmol/L — ABNORMAL LOW (ref 135–145)

## 2021-12-11 LAB — GLUCOSE, CAPILLARY
Glucose-Capillary: 123 mg/dL — ABNORMAL HIGH (ref 70–99)
Glucose-Capillary: 154 mg/dL — ABNORMAL HIGH (ref 70–99)
Glucose-Capillary: 150 mg/dL — ABNORMAL HIGH (ref 70–99)
Glucose-Capillary: 138 mg/dL — ABNORMAL HIGH (ref 70–99)
Glucose-Capillary: 176 mg/dL — ABNORMAL HIGH (ref 70–99)
Glucose-Capillary: 149 mg/dL — ABNORMAL HIGH (ref 70–99)

## 2021-12-11 LAB — CULTURE, RESPIRATORY W GRAM STAIN

## 2021-12-11 LAB — PHOSPHORUS
Phosphorus: 2.3 mg/dL — ABNORMAL LOW (ref 2.5–4.6)
Phosphorus: 3.2 mg/dL (ref 2.5–4.6)

## 2021-12-11 LAB — MAGNESIUM
Magnesium: 2 mg/dL (ref 1.7–2.4)
Magnesium: 2.4 mg/dL (ref 1.7–2.4)

## 2021-12-11 MED ORDER — POTASSIUM CHLORIDE 20 MEQ PO PACK
40.0000 meq | PACK | ORAL | Status: AC
  Administered 2021-12-11 (×3): 40 meq
  Filled 2021-12-11 (×3): qty 2

## 2021-12-11 MED ORDER — ENOXAPARIN SODIUM 40 MG/0.4ML IJ SOSY
40.0000 mg | PREFILLED_SYRINGE | INTRAMUSCULAR | Status: DC
  Administered 2021-12-11 – 2021-12-19 (×8): 40 mg via SUBCUTANEOUS
  Filled 2021-12-11 (×7): qty 0.4

## 2021-12-11 MED ORDER — HYDROCORTISONE SOD SUC (PF) 100 MG IJ SOLR
50.0000 mg | Freq: Two times a day (BID) | INTRAMUSCULAR | Status: DC
Start: 2021-12-11 — End: 2021-12-12
  Administered 2021-12-11 (×2): 50 mg via INTRAVENOUS
  Filled 2021-12-11 (×3): qty 1

## 2021-12-11 MED ORDER — MAGNESIUM SULFATE 2 GM/50ML IV SOLN
2.0000 g | Freq: Once | INTRAVENOUS | Status: AC
  Administered 2021-12-11: 2 g via INTRAVENOUS
  Filled 2021-12-11: qty 50

## 2021-12-11 MED ORDER — DEXTROSE 5 % IV SOLN
15.0000 mmol | Freq: Once | INTRAVENOUS | Status: AC
  Administered 2021-12-11: 15 mmol via INTRAVENOUS
  Filled 2021-12-11: qty 5

## 2021-12-11 MED ORDER — MELATONIN 5 MG PO TABS
5.0000 mg | ORAL_TABLET | Freq: Every day | ORAL | Status: DC
Start: 2021-12-11 — End: 2022-01-08
  Administered 2021-12-11 – 2022-01-07 (×28): 5 mg
  Filled 2021-12-11 (×29): qty 1

## 2021-12-11 MED ORDER — POTASSIUM CHLORIDE 10 MEQ/50ML IV SOLN
10.0000 meq | INTRAVENOUS | Status: AC
  Administered 2021-12-11 (×2): 10 meq via INTRAVENOUS
  Filled 2021-12-11 (×2): qty 50

## 2021-12-11 MED ORDER — PIPERACILLIN-TAZOBACTAM 3.375 G IVPB
3.3750 g | Freq: Three times a day (TID) | INTRAVENOUS | Status: AC
  Administered 2021-12-11 – 2021-12-18 (×21): 3.375 g via INTRAVENOUS
  Filled 2021-12-11 (×21): qty 50

## 2021-12-11 MED ORDER — POTASSIUM CHLORIDE 20 MEQ PO PACK
20.0000 meq | PACK | Freq: Once | ORAL | Status: AC
  Administered 2021-12-11: 20 meq
  Filled 2021-12-11: qty 1

## 2021-12-11 NOTE — Progress Notes (Signed)
Pharmacy Antibiotic Note  Jerry [name] is a 51 y.o. male admitted on 12/09/2021 with pneumonia.  Pharmacy has been consulted for Zosyn dosing.  Plan: Zosyn 3.375g IV q8h (4 hour infusion). Monitor renal function.  Weight: 55.7 kg (122 lb 12.7 oz)  Temp (24hrs), Avg:99 F (37.2 C), Min:98.7 F (37.1 C), Max:99.4 F (37.4 C)  Recent Labs  Lab 12/09/21 0400 12/09/21 0517 12/10/21 0311 12/10/21 1310 12/11/21 0307  WBC 8.1  --  10.2  --   --   CREATININE 0.36* 0.40* 0.35* 0.31* 0.32*  LATICACIDVEN 1.5  --   --   --   --     Estimated Creatinine Clearance: 88 mL/min (A) (by C-G formula based on SCr of 0.32 mg/dL (L)).    Allergies  Allergen Reactions   Crab [Shellfish Allergy] Rash    Antimicrobials this admission: Unasyn  6/6 >> 6/9 Zosyn 6/9 >>     Microbiology results: 6/7 BAL - Pseudamonas - pan sens 6/7 MRSA PCR: not detected  Thank you for allowing pharmacy to be a part of this patient's care.  Alanda Slim, PharmD, [facility] Clinical Pharmacist Please see AMION for all Pharmacists' Contact Phone Numbers 12/11/2021, 10:11 AM

## 2021-12-11 NOTE — TOC Initial Note (Signed)
Transition of Care [facility]) - Initial/Assessment Note    Patient Details  Name: Jerry [name] MRN: [id number] Date of Birth: [birth year]  Transition of [facility]) CM/SW Contact:    Milinda Antis, [place] Phone Number: 12/11/2021, 2:43 PM  Clinical Narrative:                 CSW received consult for LTACH placement and met with the patient and wife at bedside.  The patient remains intubated and a tracheostomy is planned for Monday.  The patient's wife reports that the patient does not want to be placed anywhere and the plan would be for the patient to return home.  The patient's wife reports that she will be at the home to assist the patient.  The patient's wife inquired about home health services.  CSW explained that RNCM will follow up with this information once patient is more stable.  The wife reports that the patient is receiving PCS through [facility] NPI #[id number].   TOC will continue to follow for d/c needs.    Expected Discharge Plan: [place] Barriers to Discharge: Continued Medical Work up   Patient Goals and CMS Choice Patient states their goals for this hospitalization and ongoing recovery are:: To be home      Expected Discharge Plan and Services Expected Discharge Plan: [place]       Living arrangements for the past 2 months: Single Family Home                                      Prior Living Arrangements/Services Living arrangements for the past 2 months: Single Family Home Lives with:: Spouse   Do you feel safe going back to the place where you live?: Yes      Need for Family Participation in Patient Care: Yes (Comment) Care giver support system in place?: Yes (comment)   Criminal Activity/Legal Involvement Pertinent to Current Situation/Hospitalization: Yes - Comment as needed  Activities of Daily Living      Permission Sought/Granted                  Emotional Assessment Appearance:: Appears older  than stated age Attitude/Demeanor/Rapport: Intubated (Following Commands or Not Following Commands) Affect (typically observed): Unable to Assess   Alcohol / Substance Use: Not Applicable Psych Involvement: No (comment)  Admission diagnosis:  Respiratory arrest ([place]) [R09.2] Hyponatremia [E87.1] Ileus ([place]) [K56.7] Septic shock ([place]) [A41.9, R65.21] Patient Active Problem List   Diagnosis Date Noted   Septic shock ([place]) 12/09/2021   Anxiety 11/18/2021   Bed sore on buttock, right, unstageable ([place]) 11/18/2021   Severe protein-calorie malnutrition ([place]) 11/13/2021   Microscopic hematuria 10/29/2021   S/P percutaneous endoscopic gastrostomy (PEG) tube placement ([place]) 10/29/2021   ALS (amyotrophic lateral sclerosis) ([place]) 01/11/2019   PCP:  [name], MD Pharmacy:   [facility] [place] [place] [address] Phone: [phone] Fax: [phone]     Social Determinants of Health (SDOH) Interventions    Readmission Risk Interventions     No data to display

## 2021-12-11 NOTE — Progress Notes (Signed)
[facility] ADULT ICU REPLACEMENT PROTOCOL   The patient does apply for the [facility] Adult ICU Electrolyte Replacment Protocol based on the criteria listed below:   1.Exclusion criteria: TCTS patients, ECMO patients, and Dialysis patients 2. Is GFR >/= 30 ml/min? Yes.    Patient's GFR today is >60 3. Is SCr </= 2? Yes.   Patient's SCr is 0.32 mg/dL 4. Did SCr increase >/= 0.5 in 24 hours? No. 5.Pt's weight >40kg  Yes.   6. Abnormal electrolyte(s):   K 3.1, Phos 2.3  7. Electrolytes replaced per protocol 8.  Call MD STAT for K+ </= 2.5, Phos </= 1, or Mag </= 1 Physician:  S. Rosie Fate R Kaevon Cotta 12/11/2021 6:19 AM

## 2021-12-11 NOTE — Progress Notes (Signed)
Pt placed back on full vent support due to tachypnea and low min ventilation. Pt tolerating well at this time, RN aware, RT will monitor.

## 2021-12-11 NOTE — IPAL (Signed)
  Interdisciplinary Goals of Care Family Meeting   Date carried out:: 12/11/2021  Location of the meeting: Bedside  Member's involved: Physician, Bedside Registered Nurse, and Family Member or next of kin  Durable Power of Attorney or acting medical decision maker: [name]    Discussion: We discussed goals of care for [facility] .    The Clinical status was relayed to patient's wife and other family members at bedside in detail.   Updated and notified of patients medical condition.   Patient is having a weak cough and struggling to remove secretions.   Patient with increased WOB and using accessory muscles to breathe Explained to family course of therapy and the modalities   Patient's family decided that they would like to continue full scope of care including proceeding with tracheostomy on Monday  Code status: Full Code  Disposition: Continue current acute care    Family are satisfied with Plan of action and management. All questions answered   Jacky Kindle MD [place] Pulmonary Critical Care See Amion for pager If no response to pager, please call [phone] until 7pm After 7pm, Please call E-link [phone]

## 2021-12-11 NOTE — Progress Notes (Signed)
NAME:  Jerry [name], MRN:  [id number], DOB:  08-03-1970, LOS: 2 ADMISSION DATE:  12/09/2021, CONSULTATION DATE: 12/09/2021 REFERRING MD: Emergency department physician, CHIEF COMPLAINT: Acute on chronic respiratory failure  History of Present Illness:  51 year old male with known ALS which is progressed over the last year to the point that he is wheelchair-bound and is having increasing respiratory distress over the last few weeks and presented to the emergency room 12/09/2021 required urgent intubation.  The patient and family have discussed the possibility of a tracheostomy and want to proceed with that option.  He was admitted to the intensive care unit for further evaluation and treatment.  Pertinent  Medical History   Past Medical History:  Diagnosis Date   ALS (amyotrophic lateral sclerosis) ([place])    COVID-19 virus infection 06/2020   Meningitis 2003   spinal   Morbid obesity ([place])    Prediabetes 11/05/2016   A1C 5.7 on 11/05/16     Significant Hospital Events: Including procedures, antibiotic start and stop dates in addition to other pertinent events   12/09/2021 intubated. Reportedly brief code in the ED with rapid response RN. Arrived in the ICU. Became hypotensive despite vasopressor support. Lost pulse again and CPR performed with ROSC. Bronchoscopy with erythematous airways with scant thin secretions  Interim History / Subjective:  No overnight issues Respiratory culture grew pansensitive Pseudomonas Remained afebrile  Objective   Blood pressure 113/79, pulse 98, temperature 98.8 F (37.1 C), temperature source Axillary, resp. rate 12, weight 55.7 kg, SpO2 99 %.    Vent Mode: PSV;CPAP FiO2 (%):  [30 %] 30 % Set Rate:  [18 bmp-20 bmp] 20 bmp Vt Set:  [490 mL-550 mL] 490 mL PEEP:  [5 cmH20] 5 cmH20 Pressure Support:  [15 cmH20] 15 cmH20 Plateau Pressure:  [15 cmH20-22 cmH20] 22 cmH20   Intake/Output Summary (Last 24 hours) at 12/11/2021 1039 Last data filed at 12/11/2021  1000 Gross per 24 hour  Intake 4572.87 ml  Output 750 ml  Net 3822.87 ml   Filed Weights   12/09/21 0800 12/11/21 0500  Weight: 42.6 kg 55.7 kg   Physical Exam:   Physical exam: General: Crtitically ill-appearing male, orally intubated HEENT: [place]/AT, eyes anicteric.  ETT and OGT in place Neuro: Awake, intermittently following few commands with blink reflex Chest: Coarse breath sounds, no wheezes or rhonchi Heart: Regular rate and rhythm, no murmurs or gallops Abdomen: Soft, nontender, nondistended, bowel sounds present Skin: No rash  Resolved Hospital Problem list     Assessment & Plan:  Acute on chronic hypoxic/hypercapnic respiratory failure secondary to progressive neuromuscular disease  Progressive ALS on home NIV Pneumonia with Pseudomonas Continue lung protective ventilation SBT/WUA daily but will be vent dependent in setting of NMD Pulmonary toilet Patient will require tracheostomy Off sedation Switch antibiotics from Unasyn to Zosyn considering respiratory culture with BAL is growing Pseudomonas remain off vasopressors  Sepsis with septic shock Patient came off of vasopressors Continue antibiotics Decrease steroids dose steroid to 50 mg twice daily   S/p in-hospital PEA cardiac arrest Back to his baseline ROSC was achieved after 2 minutes of CPR Continue Tele monitoring  Hyponatremia/hypokalemia/hypophosphatemia Ileus in the setting of electrolyte imbalance Aggressively supplement electrolytes and monitor Restarted back on tube feeds  Unstageable bedsore buttock, POA Continue wound care .  Best Practice (right click and "Reselect all SmartList Selections" daily)   Diet/type: tubefeeds DVT prophylaxis: LMWH GI prophylaxis: PPI Lines: Central line Foley: Discontinued today Code Status:  full code Last date of  multidisciplinary goals of care discussion [12/11/2021, see iPal note]   Critical care time:    Total critical care time: 37  minutes  Performed by: [place] care time was exclusive of separately billable procedures and treating other patients.   Critical care was necessary to treat or prevent imminent or life-threatening deterioration.   Critical care was time spent personally by me on the following activities: development of treatment plan with patient and/or surrogate as well as nursing, discussions with consultants, evaluation of patient's response to treatment, examination of patient, obtaining history from patient or surrogate, ordering and performing treatments and interventions, ordering and review of laboratory studies, ordering and review of radiographic studies, pulse oximetry and re-evaluation of patient's condition.   Jacky Kindle, MD [facility] See Amion for pager If no response to pager, please call [phone] until 7pm After 7pm, Please call E-link [phone]

## 2021-12-12 ENCOUNTER — Inpatient Hospital Stay (HOSPITAL_COMMUNITY): Payer: Medicare HMO

## 2021-12-12 DIAGNOSIS — K567 Ileus, unspecified: Secondary | ICD-10-CM | POA: Diagnosis not present

## 2021-12-12 DIAGNOSIS — Z4682 Encounter for fitting and adjustment of non-vascular catheter: Secondary | ICD-10-CM | POA: Diagnosis not present

## 2021-12-12 DIAGNOSIS — A419 Sepsis, unspecified organism: Secondary | ICD-10-CM | POA: Diagnosis not present

## 2021-12-12 DIAGNOSIS — R092 Respiratory arrest: Secondary | ICD-10-CM | POA: Diagnosis not present

## 2021-12-12 DIAGNOSIS — E871 Hypo-osmolality and hyponatremia: Secondary | ICD-10-CM | POA: Diagnosis not present

## 2021-12-12 DIAGNOSIS — K6389 Other specified diseases of intestine: Secondary | ICD-10-CM | POA: Diagnosis not present

## 2021-12-12 DIAGNOSIS — R140 Abdominal distension (gaseous): Secondary | ICD-10-CM | POA: Diagnosis not present

## 2021-12-12 DIAGNOSIS — R6521 Severe sepsis with septic shock: Secondary | ICD-10-CM | POA: Diagnosis not present

## 2021-12-12 LAB — BASIC METABOLIC PANEL
Anion gap: 8 (ref 5–15)
BUN: 8 mg/dL (ref 6–20)
CO2: 25 mmol/L (ref 22–32)
Calcium: 8.8 mg/dL — ABNORMAL LOW (ref 8.9–10.3)
Chloride: 105 mmol/L (ref 98–111)
Creatinine, Ser: <0.3 mg/dL — ABNORMAL LOW (ref 0.61–1.24)
Glucose, Bld: 162 mg/dL — ABNORMAL HIGH (ref 70–99)
Potassium: 4.3 mmol/L (ref 3.5–5.1)
Sodium: 138 mmol/L (ref 135–145)

## 2021-12-12 LAB — GLUCOSE, CAPILLARY
Glucose-Capillary: 167 mg/dL — ABNORMAL HIGH (ref 70–99)
Glucose-Capillary: 195 mg/dL — ABNORMAL HIGH (ref 70–99)
Glucose-Capillary: 141 mg/dL — ABNORMAL HIGH (ref 70–99)
Glucose-Capillary: 131 mg/dL — ABNORMAL HIGH (ref 70–99)
Glucose-Capillary: 121 mg/dL — ABNORMAL HIGH (ref 70–99)
Glucose-Capillary: 118 mg/dL — ABNORMAL HIGH (ref 70–99)

## 2021-12-12 LAB — CULTURE, RESPIRATORY W GRAM STAIN

## 2021-12-12 LAB — MAGNESIUM: Magnesium: 2.2 mg/dL (ref 1.7–2.4)

## 2021-12-12 LAB — PHOSPHORUS: Phosphorus: 2.9 mg/dL (ref 2.5–4.6)

## 2021-12-12 MED ORDER — SALINE SPRAY 0.65 % NA SOLN
1.0000 | NASAL | Status: DC | PRN
  Administered 2021-12-12 – 2021-12-24 (×3): 1 via NASAL
  Filled 2021-12-12: qty 44

## 2021-12-12 MED ORDER — SCOPOLAMINE 1 MG/3DAYS TD PT72
1.0000 | MEDICATED_PATCH | TRANSDERMAL | Status: DC
  Administered 2021-12-12 – 2022-01-08 (×10): 1.5 mg via TRANSDERMAL
  Filled 2021-12-12 (×12): qty 1

## 2021-12-12 NOTE — Progress Notes (Signed)
NAME:  Jerry [name], MRN:  [id number], DOB:  04-12-1971, LOS: 3 ADMISSION DATE:  12/09/2021, CONSULTATION DATE: 12/09/2021 REFERRING MD: Emergency department physician, CHIEF COMPLAINT: Acute on chronic respiratory failure  History of Present Illness:  51 year old male with known ALS which is progressed over the last year to the point that he is wheelchair-bound and is having increasing respiratory distress over the last few weeks and presented to the emergency room 12/09/2021 required urgent intubation.  The patient and family have discussed the possibility of a tracheostomy and want to proceed with that option.  He was admitted to the intensive care unit for further evaluation and treatment.  Pertinent  Medical History   Past Medical History:  Diagnosis Date   ALS (amyotrophic lateral sclerosis) ([place])    COVID-19 virus infection 06/2020   Meningitis 2003   spinal   Morbid obesity ([place])    Prediabetes 11/05/2016   A1C 5.7 on 11/05/16    Significant Hospital Events: Including procedures, antibiotic start and stop dates in addition to other pertinent events   12/09/2021 intubated. Reportedly brief code in the ED with rapid response RN. Arrived in the ICU. Became hypotensive despite vasopressor support. Lost pulse again and CPR performed with ROSC. Bronchoscopy with erythematous airways with scant thin secretions  Interim History / Subjective:  No overnight issues Remained afebrile  Objective   Blood pressure 111/86, pulse 96, temperature 99 F (37.2 C), temperature source Oral, resp. rate 11, weight 56.6 kg, SpO2 97 %.    Vent Mode: PSV;CPAP FiO2 (%):  [30 %-40 %] 40 % Set Rate:  [20 bmp] 20 bmp Vt Set:  [490 mL] 490 mL PEEP:  [5 cmH20] 5 cmH20 Pressure Support:  [10 cmH20-15 cmH20] 10 cmH20 Plateau Pressure:  [18 cmH20-20 cmH20] 18 cmH20   Intake/Output Summary (Last 24 hours) at 12/12/2021 0837 Last data filed at 12/12/2021 0600 Gross per 24 hour  Intake 1940.84 ml  Output 450 ml   Net 1490.84 ml   Filed Weights   12/09/21 0800 12/11/21 0500 12/12/21 0314  Weight: 42.6 kg 55.7 kg 56.6 kg   Physical Exam:   Physical exam: General: Acute on chronically ill-appearing male, orally intubated HEENT: [place]/AT, eyes anicteric.  ETT and OGT in place Neuro: Awake, intermittently following few commands with blink reflex Chest: Coarse breath sounds, no wheezes or rhonchi Heart: Regular rate and rhythm, no murmurs or gallops Abdomen: Soft, nontender, nondistended, bowel sounds present Skin: No rash  Resolved Hospital Problem list   Septic shock  Assessment & Plan:  Acute on chronic hypoxic/hypercapnic respiratory failure secondary to progressive neuromuscular disease  Progressive ALS on home NIV Pneumonia with Pseudomonas Continue lung protective ventilation SBT/WUA daily but will be vent dependent in setting of NMD Pulmonary toilet Patient will require tracheostomy, plan is for Monday Remain off sedation He is afebrile Continue Zosyn to complete 7-day therapy  Sepsis with septic shock Patient came off of vasopressors Continue antibiotics Stop hydrocortisone  S/p in-hospital PEA cardiac arrest Back to his baseline ROSC was achieved after 2 minutes of CPR Continue Tele monitoring  Hyponatremia/hypokalemia/hypophosphatemia, resolved Ileus in the setting of electrolyte imbalance Electrolytes are within normal range We will repeat KUB  Unstageable bedsore buttock, POA Continue wound care  Best Practice (right click and "Reselect all SmartList Selections" daily)   Diet/type: tubefeeds DVT prophylaxis: LMWH GI prophylaxis: PPI Lines: [place] Foley: N/A Code Status:  full code Last date of multidisciplinary goals of care discussion [12/11/2021, see iPal note]   Critical care  time:    Total critical care time: 35 minutes  Performed by: [place] care time was exclusive of separately billable procedures and treating other patients.    Critical care was necessary to treat or prevent imminent or life-threatening deterioration.   Critical care was time spent personally by me on the following activities: development of treatment plan with patient and/or surrogate as well as nursing, discussions with consultants, evaluation of patient's response to treatment, examination of patient, obtaining history from patient or surrogate, ordering and performing treatments and interventions, ordering and review of laboratory studies, ordering and review of radiographic studies, pulse oximetry and re-evaluation of patient's condition.   Jacky Kindle, MD  Pulmonary Critical Care See Amion for pager If no response to pager, please call [phone] until 7pm After 7pm, Please call E-link [phone]

## 2021-12-13 DIAGNOSIS — A419 Sepsis, unspecified organism: Secondary | ICD-10-CM | POA: Diagnosis not present

## 2021-12-13 DIAGNOSIS — K567 Ileus, unspecified: Secondary | ICD-10-CM | POA: Diagnosis not present

## 2021-12-13 DIAGNOSIS — E871 Hypo-osmolality and hyponatremia: Secondary | ICD-10-CM | POA: Diagnosis not present

## 2021-12-13 DIAGNOSIS — R092 Respiratory arrest: Secondary | ICD-10-CM | POA: Diagnosis not present

## 2021-12-13 DIAGNOSIS — R6521 Severe sepsis with septic shock: Secondary | ICD-10-CM | POA: Diagnosis not present

## 2021-12-13 LAB — CBC WITH DIFFERENTIAL/PLATELET
Abs Immature Granulocytes: 0.03 10*3/uL (ref 0.00–0.07)
Basophils Absolute: 0 10*3/uL (ref 0.0–0.1)
Basophils Relative: 0 %
Eosinophils Absolute: 0.1 10*3/uL (ref 0.0–0.5)
Eosinophils Relative: 1 %
HCT: 29.6 % — ABNORMAL LOW (ref 39.0–52.0)
Hemoglobin: 9.9 g/dL — ABNORMAL LOW (ref 13.0–17.0)
Immature Granulocytes: 0 %
Lymphocytes Relative: 17 %
Lymphs Abs: 1.5 10*3/uL (ref 0.7–4.0)
MCH: 30.4 pg (ref 26.0–34.0)
MCHC: 33.4 g/dL (ref 30.0–36.0)
MCV: 90.8 fL (ref 80.0–100.0)
Monocytes Absolute: 0.4 10*3/uL (ref 0.1–1.0)
Monocytes Relative: 4 %
Neutro Abs: 7 10*3/uL (ref 1.7–7.7)
Neutrophils Relative %: 78 %
Platelets: 132 10*3/uL — ABNORMAL LOW (ref 150–400)
RBC: 3.26 MIL/uL — ABNORMAL LOW (ref 4.22–5.81)
RDW: 15.4 % (ref 11.5–15.5)
WBC: 8.9 10*3/uL (ref 4.0–10.5)
nRBC: 0 % (ref 0.0–0.2)

## 2021-12-13 LAB — BASIC METABOLIC PANEL
Anion gap: 6 (ref 5–15)
BUN: 7 mg/dL (ref 6–20)
CO2: 29 mmol/L (ref 22–32)
Calcium: 8.7 mg/dL — ABNORMAL LOW (ref 8.9–10.3)
Chloride: 105 mmol/L (ref 98–111)
Creatinine, Ser: <0.3 mg/dL — ABNORMAL LOW (ref 0.61–1.24)
Glucose, Bld: 142 mg/dL — ABNORMAL HIGH (ref 70–99)
Potassium: 3.8 mmol/L (ref 3.5–5.1)
Sodium: 140 mmol/L (ref 135–145)

## 2021-12-13 LAB — MAGNESIUM: Magnesium: 1.9 mg/dL (ref 1.7–2.4)

## 2021-12-13 LAB — GLUCOSE, CAPILLARY
Glucose-Capillary: 133 mg/dL — ABNORMAL HIGH (ref 70–99)
Glucose-Capillary: 112 mg/dL — ABNORMAL HIGH (ref 70–99)
Glucose-Capillary: 106 mg/dL — ABNORMAL HIGH (ref 70–99)
Glucose-Capillary: 126 mg/dL — ABNORMAL HIGH (ref 70–99)
Glucose-Capillary: 111 mg/dL — ABNORMAL HIGH (ref 70–99)
Glucose-Capillary: 112 mg/dL — ABNORMAL HIGH (ref 70–99)

## 2021-12-13 LAB — PHOSPHORUS: Phosphorus: 2.8 mg/dL (ref 2.5–4.6)

## 2021-12-13 MED ORDER — POTASSIUM CHLORIDE 10 MEQ/100ML IV SOLN
10.0000 meq | INTRAVENOUS | Status: AC
  Administered 2021-12-13 (×4): 10 meq via INTRAVENOUS
  Filled 2021-12-13 (×4): qty 100

## 2021-12-13 MED ORDER — PANTOPRAZOLE SODIUM 40 MG IV SOLR
40.0000 mg | Freq: Every day | INTRAVENOUS | Status: DC
  Administered 2021-12-13 – 2021-12-19 (×7): 40 mg via INTRAVENOUS
  Filled 2021-12-13 (×7): qty 10

## 2021-12-13 NOTE — Progress Notes (Signed)
NAME:  Jerry [name], MRN:  [id number], DOB:  [birth year], LOS: 4 ADMISSION DATE:  12/09/2021, CONSULTATION DATE: 12/09/2021 REFERRING MD: Emergency department physician, CHIEF COMPLAINT: Acute on chronic respiratory failure  History of Present Illness:  51 year old male with known ALS which is progressed over the last year to the point that he is wheelchair-bound and is having increasing respiratory distress over the last few weeks and presented to the emergency room 12/09/2021 required urgent intubation.  The patient and family have discussed the possibility of a tracheostomy and want to proceed with that option.  He was admitted to the intensive care unit for further evaluation and treatment.  Pertinent  Medical History   Past Medical History:  Diagnosis Date   ALS (amyotrophic lateral sclerosis) ([place])    COVID-19 virus infection 06/2020   Meningitis 2003   spinal   Morbid obesity ([place])    Prediabetes 11/05/2016   A1C 5.7 on 11/05/16    Significant Hospital Events: Including procedures, antibiotic start and stop dates in addition to other pertinent events   12/09/2021 intubated. Reportedly brief code in the ED with rapid response RN. Arrived in the ICU. Became hypotensive despite vasopressor support. Lost pulse again and CPR performed with ROSC. Bronchoscopy with erythematous airways with scant thin secretions  Interim History / Subjective:  No overnight issues Remained afebrile Patient has been failing spontaneous breathing trial due to apnea spells  Objective   Blood pressure 90/68, pulse 96, temperature 99.2 F (37.3 C), temperature source Oral, resp. rate 20, weight 56.8 kg, SpO2 99 %.    Vent Mode: PRVC FiO2 (%):  [40 %] 40 % Set Rate:  [20 bmp] 20 bmp Vt Set:  [490 mL] 490 mL PEEP:  [5 cmH20] 5 cmH20 Pressure Support:  [10 cmH20] 10 cmH20 Plateau Pressure:  [16 cmH20-19 cmH20] 19 cmH20   Intake/Output Summary (Last 24 hours) at 12/13/2021 0857 Last data filed at 12/13/2021  0800 Gross per 24 hour  Intake 1555.34 ml  Output 1250 ml  Net 305.34 ml   Filed Weights   12/11/21 0500 12/12/21 0314 12/13/21 0500  Weight: 55.7 kg 56.6 kg 56.8 kg   Physical Exam: General: Acute on chronically ill-appearing male, orally intubated HEENT: [place]/AT, eyes anicteric.  ETT and OGT in place Neuro: Awake, intermittently following few commands with blink reflex Chest: Coarse breath sounds, no wheezes or rhonchi Heart: Regular rate and rhythm, no murmurs or gallops Abdomen: Soft, nontender, nondistended, bowel sounds present Skin: No rash  Resolved Hospital Problem list   Sepsis with septic shock Hyponatremia/hypokalemia/hypophosphatemia  Assessment & Plan:  Acute on chronic hypoxic/hypercapnic respiratory failure secondary to progressive neuromuscular disease  Progressive ALS on home NIV Pneumonia with Pseudomonas Continue lung protective ventilation Failing spontaneous breathing trial due to apnea spells caused by muscular weakness SBT/WUA daily but will be vent dependent in setting of NMD Pulmonary toilet Scheduled for tracheostomy tomorrow He is afebrile Continue Zosyn to complete 7-day therapy  S/p in-hospital PEA cardiac arrest ROSC was achieved after 2 minutes of CPR Back to his baseline Continue Tele monitoring  Ileus in the setting of electrolyte imbalance Electrolytes are within normal range KUB still suggestive of ileus Keep potassium >4 and magnesium>2  Unstageable bedsore buttock, POA Continue wound care  Best Practice (right click and "Reselect all SmartList Selections" daily)   Diet/type: tubefeeds DVT prophylaxis: LMWH GI prophylaxis: PPI Lines: Port Foley: N/A Code Status:  full code Last date of multidisciplinary goals of care discussion [12/13/2021, patient's wife was updated  at bedside]   Critical care time:    Total critical care time: 32 minutes  Performed by: [place] care time was exclusive of separately  billable procedures and treating other patients.   Critical care was necessary to treat or prevent imminent or life-threatening deterioration.   Critical care was time spent personally by me on the following activities: development of treatment plan with patient and/or surrogate as well as nursing, discussions with consultants, evaluation of patient's response to treatment, examination of patient, obtaining history from patient or surrogate, ordering and performing treatments and interventions, ordering and review of laboratory studies, ordering and review of radiographic studies, pulse oximetry and re-evaluation of patient's condition.   Jacky Kindle, MD [facility] See Amion for pager If no response to pager, please call [phone] until 7pm After 7pm, Please call E-link [phone]

## 2021-12-14 ENCOUNTER — Inpatient Hospital Stay (HOSPITAL_COMMUNITY): Payer: Medicare HMO

## 2021-12-14 DIAGNOSIS — A419 Sepsis, unspecified organism: Secondary | ICD-10-CM | POA: Diagnosis not present

## 2021-12-14 DIAGNOSIS — R6521 Severe sepsis with septic shock: Secondary | ICD-10-CM | POA: Diagnosis not present

## 2021-12-14 DIAGNOSIS — K567 Ileus, unspecified: Secondary | ICD-10-CM | POA: Diagnosis not present

## 2021-12-14 DIAGNOSIS — R092 Respiratory arrest: Secondary | ICD-10-CM | POA: Diagnosis not present

## 2021-12-14 DIAGNOSIS — Z4682 Encounter for fitting and adjustment of non-vascular catheter: Secondary | ICD-10-CM | POA: Diagnosis not present

## 2021-12-14 DIAGNOSIS — G1221 Amyotrophic lateral sclerosis: Secondary | ICD-10-CM | POA: Diagnosis not present

## 2021-12-14 DIAGNOSIS — E871 Hypo-osmolality and hyponatremia: Secondary | ICD-10-CM | POA: Diagnosis not present

## 2021-12-14 LAB — GLUCOSE, CAPILLARY
Glucose-Capillary: 107 mg/dL — ABNORMAL HIGH (ref 70–99)
Glucose-Capillary: 114 mg/dL — ABNORMAL HIGH (ref 70–99)
Glucose-Capillary: 135 mg/dL — ABNORMAL HIGH (ref 70–99)
Glucose-Capillary: 139 mg/dL — ABNORMAL HIGH (ref 70–99)
Glucose-Capillary: 156 mg/dL — ABNORMAL HIGH (ref 70–99)
Glucose-Capillary: 99 mg/dL (ref 70–99)

## 2021-12-14 LAB — BASIC METABOLIC PANEL
Anion gap: 6 (ref 5–15)
BUN: 8 mg/dL (ref 6–20)
CO2: 25 mmol/L (ref 22–32)
Calcium: 8.4 mg/dL — ABNORMAL LOW (ref 8.9–10.3)
Chloride: 105 mmol/L (ref 98–111)
Creatinine, Ser: <0.3 mg/dL — ABNORMAL LOW (ref 0.61–1.24)
Glucose, Bld: 135 mg/dL — ABNORMAL HIGH (ref 70–99)
Potassium: 3.9 mmol/L (ref 3.5–5.1)
Sodium: 136 mmol/L (ref 135–145)

## 2021-12-14 LAB — CBC
HCT: 28.7 % — ABNORMAL LOW (ref 39.0–52.0)
Hemoglobin: 10 g/dL — ABNORMAL LOW (ref 13.0–17.0)
MCH: 31.2 pg (ref 26.0–34.0)
MCHC: 34.8 g/dL (ref 30.0–36.0)
MCV: 89.4 fL (ref 80.0–100.0)
Platelets: 135 10*3/uL — ABNORMAL LOW (ref 150–400)
RBC: 3.21 MIL/uL — ABNORMAL LOW (ref 4.22–5.81)
RDW: 15.3 % (ref 11.5–15.5)
WBC: 7.3 10*3/uL (ref 4.0–10.5)
nRBC: 0 % (ref 0.0–0.2)

## 2021-12-14 LAB — CULTURE, BLOOD (ROUTINE X 2)
Culture: NO GROWTH
Culture: NO GROWTH
Special Requests: ADEQUATE

## 2021-12-14 MED ORDER — POLYETHYLENE GLYCOL 3350 17 G PO PACK
17.0000 g | PACK | Freq: Every day | ORAL | Status: DC
  Administered 2021-12-14 – 2022-01-08 (×21): 17 g
  Filled 2021-12-14 (×22): qty 1

## 2021-12-14 MED ORDER — EPINEPHRINE 0.3 MG/0.3ML IJ SOAJ: 0.3000 mg | Freq: Once | INTRAMUSCULAR | Status: DC

## 2021-12-14 MED ORDER — MIDAZOLAM HCL 2 MG/2ML IJ SOLN
5.0000 mg | Freq: Once | INTRAMUSCULAR | Status: AC
  Administered 2021-12-14: 6 mg via INTRAVENOUS
  Filled 2021-12-14: qty 6

## 2021-12-14 MED ORDER — SENNOSIDES 8.8 MG/5ML PO SYRP
10.0000 mL | ORAL_SOLUTION | Freq: Every day | ORAL | Status: DC
  Administered 2021-12-18 – 2021-12-23 (×5): 10 mL
  Filled 2021-12-14 (×7): qty 10

## 2021-12-14 MED ORDER — MAGNESIUM SULFATE 2 GM/50ML IV SOLN
2.0000 g | Freq: Once | INTRAVENOUS | Status: AC
  Administered 2021-12-14: 2 g via INTRAVENOUS
  Filled 2021-12-14: qty 50

## 2021-12-14 MED ORDER — ROCURONIUM BROMIDE 50 MG/5ML IV SOLN
100.0000 mg | Freq: Once | INTRAVENOUS | Status: AC
  Administered 2021-12-14: 50 mg via INTRAVENOUS
  Filled 2021-12-14: qty 10

## 2021-12-14 MED ORDER — LIDOCAINE HCL 1 % IJ SOLN
5.0000 mL | Freq: Once | INTRAMUSCULAR | Status: AC
  Administered 2021-12-14: 5 mL via INTRADERMAL

## 2021-12-14 MED ORDER — BISACODYL 10 MG RE SUPP
10.0000 mg | Freq: Every day | RECTAL | Status: DC | PRN
Start: 2021-12-14 — End: 2022-01-08

## 2021-12-14 MED ORDER — POTASSIUM CHLORIDE 10 MEQ/100ML IV SOLN
10.0000 meq | INTRAVENOUS | Status: AC
  Administered 2021-12-14 (×3): 10 meq via INTRAVENOUS
  Filled 2021-12-14 (×3): qty 100

## 2021-12-14 MED ORDER — EPINEPHRINE PF 1 MG/ML IJ SOLN
1.0000 mg | Freq: Once | INTRAMUSCULAR | Status: AC
  Administered 2021-12-14: 1 mg via INTRAVENOUS

## 2021-12-14 MED ORDER — PROPOFOL 10 MG/ML IV BOLUS
100.0000 mg | Freq: Once | INTRAVENOUS | Status: AC
  Administered 2021-12-14: 130 mg via INTRAVENOUS
  Filled 2021-12-14: qty 20

## 2021-12-14 MED ORDER — ROCURONIUM BROMIDE 50 MG/5ML IV SOLN
100.0000 mg | Freq: Once | INTRAVENOUS | Status: DC
  Filled 2021-12-14: qty 10

## 2021-12-14 MED ORDER — BISACODYL 10 MG RE SUPP
10.0000 mg | Freq: Once | RECTAL | Status: AC
  Administered 2021-12-14: 10 mg via RECTAL
  Filled 2021-12-14: qty 1

## 2021-12-14 MED ORDER — FENTANYL CITRATE (PF) 100 MCG/2ML IJ SOLN
200.0000 ug | Freq: Once | INTRAMUSCULAR | Status: AC
  Administered 2021-12-14: 200 ug via INTRAVENOUS
  Filled 2021-12-14: qty 4

## 2021-12-14 MED ORDER — ACETAMINOPHEN 325 MG PO TABS
650.0000 mg | ORAL_TABLET | Freq: Four times a day (QID) | ORAL | Status: DC | PRN
Start: 2021-12-14 — End: 2022-01-08
  Administered 2021-12-14 – 2022-01-05 (×8): 650 mg via NASOGASTRIC
  Filled 2021-12-14 (×9): qty 2

## 2021-12-14 NOTE — Progress Notes (Signed)
Pharmacy Antibiotic Note  Jerry [name] is a 51 y.o. male admitted on 12/09/2021 with pneumonia.  Pharmacy has been consulted for Zosyn dosing.  Plan to continue Zosyn for 7 days - until 6/16  Plan: Continue Zosyn 3.375g IV q8h (4 hour infusion). Monitor renal function.  Weight: 56.8 kg (125 lb 3.5 oz)  Temp (24hrs), Avg:99.4 F (37.4 C), Min:99 F (37.2 C), Max:100 F (37.8 C)  Recent Labs  Lab 12/09/21 0400 12/09/21 0517 12/10/21 0311 12/10/21 1310 12/11/21 0307 12/12/21 0310 12/13/21 0318 12/14/21 0255  WBC 8.1  --  10.2  --   --   --  8.9 7.3  CREATININE 0.36*   < > 0.35* 0.31* 0.32* <0.30* <0.30* <0.30*  LATICACIDVEN 1.5  --   --   --   --   --   --   --    < > = values in this interval not displayed.     CrCl cannot be calculated (This lab value cannot be used to calculate CrCl because it is not a number: <0.30).    Allergies  Allergen Reactions   Crab [Shellfish Allergy] Rash   Antimicrobials this admission: Unasyn  6/6 >> 6/9 Zosyn 6/9 >>   Microbiology results: 6/7 BAL - Pseudamonas - pan sens 6/7 MRSA PCR: not detected  Thank you for allowing pharmacy to be a part of this patient's care.  Alanda Slim, PharmD, [facility] Clinical Pharmacist Please see AMION for all Pharmacists' Contact Phone Numbers 12/14/2021, 7:35 AM

## 2021-12-14 NOTE — Progress Notes (Addendum)
A-line redressed at this time by RT, pulls back and flushes, waveform is great. RT will continue to monitor.

## 2021-12-14 NOTE — Procedures (Signed)
Percutaneous Tracheostomy Procedure Note   Jerry [name]  [id number]  [birth year]  Date:12/14/21  Time:2:37 PM   Provider Performing:Bellamy Rubey  Procedure: Percutaneous Tracheostomy with Bronchoscopic Guidance (31600)  Indication(s) Acute respiratory failure due to progressive ALS  Consent Risks of the procedure as well as the alternatives and risks of each were explained to the patient and/or caregiver.  Consent for the procedure was obtained.  Anesthesia Etomidate, Versed, Fentanyl, Vecuronium   Time Out Verified patient identification, verified procedure, site/side was marked, verified correct patient position, special equipment/implants available, medications/allergies/relevant history reviewed, required imaging and test results available.   Sterile Technique Maximal sterile technique including sterile barrier drape, hand hygiene, sterile gown, sterile gloves, mask, hair covering.    Procedure Description Appropriate anatomy identified by palpation.  Patient's neck prepped and draped in sterile fashion.  1% lidocaine with epinephrine was used to anesthetize skin overlying neck.  1.5cm incision made and blunt dissection performed until tracheal rings could be easily palpated.   Then a size 8 Shiley tracheostomy was placed under bronchoscopic visualization using usual Seldinger technique and serial dilation.   Bronchoscope confirmed placement above the carina.  Tracheostomy was sutured in place with adhesive pad to protect skin under pressure.    Patient connected to ventilator.   Complications/Tolerance None; patient tolerated the procedure well. Chest X-ray is ordered to confirm no post-procedural complication.   EBL Minimal   Specimen(s) None

## 2021-12-14 NOTE — Procedures (Signed)
Diagnostic Bronchoscopy  Jerry [name]  [id number]  14-Aug-1970  Date:12/14/21  Time:2:33 PM   Provider Performing:Cookie Pore D. Harris   Procedure: Diagnostic Bronchoscopy (01561)  Indication(s) Assist with direct visualization of tracheostomy placement  Consent Risks of the procedure as well as the alternatives and risks of each were explained to the patient and/or caregiver.  Consent for the procedure was obtained.   Anesthesia See separate tracheostomy note   Time Out Verified patient identification, verified procedure, site/side was marked, verified correct patient position, special equipment/implants available, medications/allergies/relevant history reviewed, required imaging and test results available.   Sterile Technique Usual hand hygiene, masks, gowns, and gloves were used   Procedure Description Bronchoscope advanced through endotracheal tube and into airway.  After suctioning out tracheal secretions, bronchoscope used to provide direct visualization of tracheostomy placement.   Complications/Tolerance None; patient tolerated the procedure well.   EBL None  Specimen(s) None  Jerry [name] [name], NP-C  Pulmonary & Critical Care Personal contact information can be found on Amion  12/14/2021, 2:33 PM

## 2021-12-14 NOTE — Progress Notes (Signed)
SLP Cancellation Note  Patient Details Name: Jerry [name] MRN: [id number] DOB: 11/25/1970   Cancelled treatment:        Received orders for PMV, swallow eval. Pt received trach today. Will follow for inline PMV, BSE   Houston Siren 12/14/2021, 3:32 PM

## 2021-12-14 NOTE — Progress Notes (Signed)
[facility] Progress Note Patient Name: Jerry [name] DOB: 1971-05-31 MRN: [id number]   Date of Service  12/14/2021  HPI/Events of Note  Leg pain, requesting oral tylenol   eICU Interventions  Order placed      Intervention Category Minor Interventions: Routine modifications to care plan (e.g. PRN medications for pain, fever)  Margaretmary Lombard 12/14/2021, 2:55 AM

## 2021-12-14 NOTE — Progress Notes (Signed)
NAME:  Jerry [name], MRN:  [id number], DOB:  February 20, 1971, LOS: 5 ADMISSION DATE:  12/09/2021, CONSULTATION DATE: 12/09/2021 REFERRING MD: Emergency department physician, CHIEF COMPLAINT: Acute on chronic respiratory failure  History of Present Illness:  51 year old male with known ALS which is progressed over the last year to the point that he is wheelchair-bound and is having increasing respiratory distress over the last few weeks and presented to the emergency room 12/09/2021 required urgent intubation.  The patient and family have discussed the possibility of a tracheostomy and want to proceed with that option.  He was admitted to the intensive care unit for further evaluation and treatment.  Pertinent  Medical History   Past Medical History:  Diagnosis Date   ALS (amyotrophic lateral sclerosis) ([place])    COVID-19 virus infection 06/2020   Meningitis 2003   spinal   Morbid obesity ([place])    Prediabetes 11/05/2016   A1C 5.7 on 11/05/16    Significant Hospital Events: Including procedures, antibiotic start and stop dates in addition to other pertinent events   12/09/2021 intubated. Reportedly brief code in the ED with rapid response RN. Arrived in the ICU. Became hypotensive despite vasopressor support. Lost pulse again and CPR performed with ROSC. Bronchoscopy with erythematous airways with scant thin secretions  Interim History / Subjective:  Patient complaining of bilateral leg pain, which improved with Tylenol Remained afebrile Unable to tolerate a spontaneous breathing trial due to apneic spells  Objective   Blood pressure 109/61, pulse 86, temperature 99.4 F (37.4 C), temperature source Oral, resp. rate (!) 22, weight 56.8 kg, SpO2 100 %.    Vent Mode: PRVC FiO2 (%):  [40 %] 40 % Set Rate:  [20 bmp] 20 bmp Vt Set:  [490 mL] 490 mL PEEP:  [5 cmH20] 5 cmH20 Plateau Pressure:  [14 cmH20-18 cmH20] 16 cmH20   Intake/Output Summary (Last 24 hours) at 12/14/2021 0936 Last data filed at  12/14/2021 0800 Gross per 24 hour  Intake 1922.61 ml  Output 1050 ml  Net 872.61 ml   Filed Weights   12/12/21 0314 12/13/21 0500 12/14/21 0500  Weight: 56.6 kg 56.8 kg 56.8 kg   Physical Exam: General: Acute on chronically ill-appearing male, orally intubated HEENT: [place]/AT, eyes anicteric.  ETT and OGT in place Neuro: Awake, intermittently following few commands with blink reflex Chest: Coarse breath sounds, no wheezes or rhonchi Heart: Regular rate and rhythm, no murmurs or gallops Abdomen: Soft, nontender, nondistended, bowel sounds present Skin: No rash  Resolved Hospital Problem list   Sepsis with septic shock Hyponatremia/hypokalemia/hypophosphatemia  Assessment & Plan:  Acute on chronic hypoxic/hypercapnic respiratory failure secondary to progressive neuromuscular disease  Progressive ALS on home NIV Pneumonia with pansensitive Pseudomonas Continue lung protective ventilation Failing spontaneous breathing trial due to apnea spells caused by muscular weakness SBT/WUA daily but will be vent dependent in setting of NMD Pulmonary toilet Scheduled for tracheostomy today at 2 PM He is afebrile Continue Zosyn to complete 7-day therapy  S/p in-hospital PEA cardiac arrest ROSC was achieved after 2 minutes of CPR Back to his baseline Continue Tele monitoring  Ileus in the setting of electrolyte imbalance Electrolytes are within normal range KUB still suggestive of ileus Keep potassium >4 and magnesium>2 Restart bowel regimen  Unstageable bedsore buttock, POA Continue wound care  Best Practice (right click and "Reselect all SmartList Selections" daily)   Diet/type: tubefeeds DVT prophylaxis: LMWH GI prophylaxis: PPI Lines: [place] Foley: N/A Code Status:  full code Last date of multidisciplinary goals  of care discussion [12/14/2021, patient's wife was updated at bedside]   Critical care time:    Total critical care time: 30 minutes  Performed by: Jacky Kindle    Critical care time was exclusive of separately billable procedures and treating other patients.   Critical care was necessary to treat or prevent imminent or life-threatening deterioration.   Critical care was time spent personally by me on the following activities: development of treatment plan with patient and/or surrogate as well as nursing, discussions with consultants, evaluation of patient's response to treatment, examination of patient, obtaining history from patient or surrogate, ordering and performing treatments and interventions, ordering and review of laboratory studies, ordering and review of radiographic studies, pulse oximetry and re-evaluation of patient's condition.   Jacky Kindle, MD [place] [facility] Critical [facility] See Amion for pager If no response to pager, please call [phone] until 7pm After 7pm, Please call E-link [phone]

## 2021-12-15 DIAGNOSIS — R6521 Severe sepsis with septic shock: Secondary | ICD-10-CM | POA: Diagnosis not present

## 2021-12-15 DIAGNOSIS — A419 Sepsis, unspecified organism: Secondary | ICD-10-CM | POA: Diagnosis not present

## 2021-12-15 DIAGNOSIS — K567 Ileus, unspecified: Secondary | ICD-10-CM | POA: Diagnosis not present

## 2021-12-15 DIAGNOSIS — J9601 Acute respiratory failure with hypoxia: Secondary | ICD-10-CM | POA: Diagnosis not present

## 2021-12-15 LAB — GLUCOSE, CAPILLARY
Glucose-Capillary: 107 mg/dL — ABNORMAL HIGH (ref 70–99)
Glucose-Capillary: 134 mg/dL — ABNORMAL HIGH (ref 70–99)
Glucose-Capillary: 151 mg/dL — ABNORMAL HIGH (ref 70–99)
Glucose-Capillary: 168 mg/dL — ABNORMAL HIGH (ref 70–99)
Glucose-Capillary: 126 mg/dL — ABNORMAL HIGH (ref 70–99)
Glucose-Capillary: 115 mg/dL — ABNORMAL HIGH (ref 70–99)

## 2021-12-15 MED ORDER — LIP MEDEX EX OINT
TOPICAL_OINTMENT | CUTANEOUS | Status: DC | PRN
  Administered 2021-12-15: 1 via TOPICAL
  Filled 2021-12-15: qty 7

## 2021-12-15 MED ORDER — POLYVINYL ALCOHOL 1.4 % OP SOLN
1.0000 [drp] | OPHTHALMIC | Status: DC | PRN
  Administered 2021-12-16 – 2021-12-24 (×3): 1 [drp] via OPHTHALMIC
  Filled 2021-12-15: qty 15

## 2021-12-15 NOTE — TOC Progression Note (Signed)
Transition of Care [facility]) - Progression Note    Patient Details  Name: Jerry [name] MRN: [id number] Date of Birth: 10/01/1970  Transition [facility]) CM/SW Contact  Tom-Johnson, Renea Ee, RN Phone Number: 12/15/2021, 4:15 PM  Clinical Narrative:     CM consulted for home health needs for patient. Wife wants patient to discharge home. CM spoke with patient and wife, Fredda Hammed at bedside. Patient is on [place].  [name] states patient has PCS from [facility] from M-F for 4 hrs each day. States she is the primary caregiver, their two sons assists. [name] requesting more PCS hours. CM contacted [facility] and spoke with Kayla and notified that patient has maxed out the required hours for PCS and [name] should apply for [facility] services to get more PCS hours. CM called CAP [phone]) and spoke with Cori. Referral called in and Cori states patient can receive up to 40 hrs/week of PCS and there is a waiting list. Patient's wife should receive a packet in the mail in 3 weeks. CM notified [name].  Patient gets his Peg tube supplies from [employer]. Patient will be needing Trach supplies at home and [facility] requested Trilogy. CM called in referral with Zach and acceptance voiced.   CM will continue to follow with needs.    Expected Discharge Plan: [place] Barriers to Discharge: Continued Medical Work up  Expected Discharge Plan and Services Expected Discharge Plan: [place]   Discharge Planning Services: CM Consult Post Acute Care Choice: Home Health, Durable Medical Equipment Living arrangements for the past 2 months: Single Family Home                 DME Arranged: Oxygen (Trilogy) DME Agency: [employer] Date DME Agency Contacted: 12/15/21 Time DME Agency Contacted: [phone] Representative spoke with at DME Agency: [place] ([place]) Interventions    Readmission Risk Interventions     No data to display

## 2021-12-15 NOTE — Progress Notes (Signed)
Nutrition Follow-up  DOCUMENTATION CODES:   Underweight  INTERVENTION:  ***   NUTRITION DIAGNOSIS:   Inadequate oral intake related to inability to eat as evidenced by NPO status.  ***  GOAL:   Patient will meet greater than or equal to 90% of their needs  ***  MONITOR:   Vent status, Labs, Weight trends, TF tolerance  REASON FOR ASSESSMENT:   Ventilator, Consult Enteral/tube feeding initiation and management  ASSESSMENT:   51 year old male who presented to the ED on 6/07 with AMS. PMH of ALS which has progressed since last year on home NIV, wheelchair-bound at baseline. Pt admitted with septic shock due to bilateral multifocal PNA, ileus.  06/07 - Code Blue, s/p bronchoscopy 06/08 - tube feeds initiated via PEG 06/12 - s/p tracheostomy  Discussed pt with RN and during ICU rounds.  Admit weight: 42.6 kg (question accuracy) Current weight: 56.8 kg  Current TF: Osmolite 1.2 @ 60 ml/hr  Patient is currently on ventilator support via trach MV: *** L/min Temp (24hrs), Avg:98.9 F (37.2 C), Min:98.3 F (36.8 C), Max:99.9 F (37.7 C)  Drips: Propofol: *** ml/hr  Medications reviewed and include: SSI q 4 hours, melatonin, IV protonix, miralax, senokot, IV abx  Labs reviewed: ***  NUTRITION - FOCUSED PHYSICAL EXAM:  {RD Focused Exam List:21252}  Diet Order:   Diet Order             Diet NPO time specified  Diet effective now                   EDUCATION NEEDS:   No education needs have been identified at this time  Skin:  Skin Assessment: Reviewed RN Assessment (abrasion to bilateral buttocks)  Last BM:  12/09/21 medium type 1  Height:   Ht Readings from Last 1 Encounters:  11/18/21 '5\' 5"'$  (1.651 m)    Weight:   Wt Readings from Last 1 Encounters:  12/15/21 56.8 kg    BMI:  Body mass index is 20.84 kg/m.  Estimated Nutritional Needs:   Kcal:  1700-1900  Protein:  80-95 grams  Fluid:  1.7-1.9 L    Gustavus Bryant, MS, RD,  LDN Inpatient Clinical Dietitian Please see AMiON for contact information.

## 2021-12-15 NOTE — Evaluation (Signed)
Physical Therapy Evaluation Patient Details Name: Jonathyn Carothers MRN: [id number] DOB: 22-Jul-1970 Today's Date: 12/15/2021  History of Present Illness  51 yo male admitted 6/7 with progressive respiratory decline requiring urgent intubation. Brief code in ED s/p CPR 2 min. Trach and bronch 6/12. PMhx: ALS (Dx 2020), pre diabetes  Clinical Impression  Pt alert, participating in conversation and pt and wife stating desire to return home and function at total assist level as long as possible. Pt with no functional movement of bil UE with significant weakness of bil LE and lack of trunk and neck control with mobility. Wife has been total assist lifting pt to standing for transfers and at times has assist of sons. Discussed need to transition to hospital bed level of mobility for pt and caregiver safety with education for use of bed pad for mobility, bed level ADLs and utilization of hoyer lift for OOB to power chair. Wife states they have power chair in working order but other [facility] are missing screws and non-functional. Pt currently with 4 hrs of aide assist per week and with progressive nature of disease would benefit from increased hours, and air mattress. Wife will benefit from further education for bed level mobility managing all current lines and for pt safety/skin care. Encouraged wife to also train current caregivers in aspects of feeding as well. Wife very involved, pleasant and overwhelmed with how to provide total care 24hrs but did not wish to consider SNF in order to achieve pt wishes.   CPAP, fio2 40%, peep 5, RR 22 HR 111 SpO2 99%     Recommendations for follow up therapy are one component of a multi-disciplinary discharge planning process, led by the attending physician.  Recommendations may be updated based on patient status, additional functional criteria and insurance authorization.  Follow Up Recommendations Home health PT (for caregiver training)    Assistance Recommended at Discharge  Frequent or constant Supervision/Assistance  Patient can return home with the following  Two people to help with walking and/or transfers;Two people to help with bathing/dressing/bathroom;Direct supervision/assist for medications management;Assistance with feeding;Assist for transportation    Equipment Recommendations Other (comment) (bed pan, bed pads, increased aide hours, air mattress for pressure relief and wound prevention)  Recommendations for Other Services       Functional Status Assessment Patient has had a recent decline in their functional status and/or demonstrates limited ability to make significant improvements in function in a reasonable and predictable amount of time     Precautions / Restrictions Precautions Precautions: Fall;Other (comment) Precaution Comments: trach, vent, visual communication device, peg      Mobility  Bed Mobility Overal bed mobility: Needs Assistance Bed Mobility: Rolling, Sidelying to Sit, Sit to Sidelying Rolling: Total assist, +2 for physical assistance Sidelying to sit: Total assist, +2 for physical assistance     Sit to sidelying: Total assist, +2 for physical assistance General bed mobility comments: total assist with pad to roll to side and rise from side with assist to bend knee for rolling. Total assist to lift legs and control trunk for return to supine. pillows placed under right trunk and hip for pressure relief with return to bed. EOb 4 min with VSS and max assist for sitting balance and total control for neck stability    Transfers                        Ambulation/Gait  Stairs            Wheelchair Mobility    Modified Rankin (Stroke Patients Only)       Balance Overall balance assessment: Needs assistance Sitting-balance support: Feet unsupported, No upper extremity supported Sitting balance-Leahy Scale: Zero Sitting balance - Comments: max assist for sitting balance                                      Pertinent Vitals/Pain Pain Assessment Pain Assessment: CPOT Breathing: normal Facial Expression: Tense Body Movements: Absence of movements Muscle Tension: Relaxed Compliance with ventilator (intubated pts.): Tolerating ventilator or movement Vocalization (extubated pts.): N/A CPOT Total: 1 Pain Intervention(s): Monitored during session, Repositioned    Home Living Family/patient expects to be discharged to:: Private residence Living Arrangements: Spouse/significant other Available Help at Discharge: Family;Personal care attendant Type of Home: House Home Access: Level entry       Home Layout: Two level;Able to live on main level with bedroom/bathroom Home Equipment: Hospital bed;Wheelchair - power Additional Comments: adaptive communication device, hoyer lift, lift chair, shower chair    Prior Function Prior Level of Function : Needs assist       Physical Assist : Mobility (physical);ADLs (physical) Mobility (physical): Bed mobility;Transfers   Mobility Comments: pt has been sleeping and staying in lift chair with total +2-3 person assist to stand for pivots to shower chair with bucket for toileting, standing for pressure relief, and transfers at times to power chair for going in community ADLs Comments: total assist for all aspects     Hand Dominance        Extremity/Trunk Assessment   Upper Extremity Assessment Upper Extremity Assessment: RUE deficits/detail;LUE deficits/detail RUE Deficits / Details: flaccid UE with PROM WFL LUE Deficits / Details: flaccid UE with PROM [facility]    Lower Extremity Assessment Lower Extremity Assessment: RLE deficits/detail;LLE deficits/detail RLE Deficits / Details: hip and knee flexion 2-/5, knee extension 2/5, trace ankle and digit flexion, hip abduct/add not tested, PROM WFL LLE Deficits / Details: hip and knee flexion 1/5, knee extension 1/5, trace ankle and digit flexion, hip  abduct/add not tested, PROM WFL    Cervical / Trunk Assessment Cervical / Trunk Assessment: Other exceptions Cervical / Trunk Exceptions: extremely weak trunk with mod assist for sitting balance and no noted active control of neck with total assist to position and lift in sitting  Communication   Communication: Other (comment) (pt communicates with 2 blinks for yes, 1 for no and adaptive communication eye pad that reads eye movement)  Cognition Arousal/Alertness: Awake/alert Behavior During Therapy: WFL for tasks assessed/performed Overall Cognitive Status: Within Functional Limits for tasks assessed                                          General Comments      Exercises General Exercises - Lower Extremity Heel Slides: AAROM, Both, 5 reps, Supine   Assessment/Plan    PT Assessment Patient needs continued PT services  PT Problem List Decreased activity tolerance;Cardiopulmonary status limiting activity;Decreased knowledge of use of DME       PT Treatment Interventions DME instruction;Therapeutic activities;Patient/family education;Functional mobility training    PT Goals (Current goals can be found in the Care Plan section)  Acute Rehab PT Goals Patient Stated Goal: return home with  understanding of how this will work PT Goal Formulation: With patient/family Time For Goal Achievement: 12/29/21 Potential to Achieve Goals: Fair    Frequency Min 1X/week     Co-evaluation               AM-PAC PT "6 Clicks" Mobility  Outcome Measure Help needed turning from your back to your side while in a flat bed without using bedrails?: Total Help needed moving from lying on your back to sitting on the side of a flat bed without using bedrails?: Total Help needed moving to and from a bed to a chair (including a wheelchair)?: Total Help needed standing up from a chair using your arms (e.g., wheelchair or bedside chair)?: Total Help needed to walk in hospital  room?: Total Help needed climbing 3-5 steps with a railing? : Total 6 Click Score: 6    End of Session   Activity Tolerance: Patient tolerated treatment well Patient left: in bed;with call bell/phone within reach;with family/visitor present;with nursing/sitter in room Nurse Communication: Mobility status;Need for lift equipment PT Visit Diagnosis: Other abnormalities of gait and mobility (R26.89)    Time: 1100-1156 PT Time Calculation (min) (ACUTE ONLY): 56 min   Charges:   PT Evaluation $PT Eval High Complexity: 1 High PT Treatments $Therapeutic Activity: 23-37 mins        Bayard Males, PT Acute Rehabilitation Services Office: [phone]   Sandy Salaam Tommie Dejoseph 12/15/2021, 12:54 PM

## 2021-12-15 NOTE — Progress Notes (Signed)
NAME:  Jerry [name], MRN:  [id number], DOB:  Aug 14, 1970, LOS: 6 ADMISSION DATE:  12/09/2021, CONSULTATION DATE: 12/09/2021 REFERRING MD: Emergency department physician, CHIEF COMPLAINT: Acute on chronic respiratory failure  History of Present Illness:  51 year old male with known ALS which is progressed over the last year to the point that he is wheelchair-bound and is having increasing respiratory distress over the last few weeks and presented to the emergency room 12/09/2021 required urgent intubation.  The patient and family have discussed the possibility of a tracheostomy and want to proceed with that option.  He was admitted to the intensive care unit for further evaluation and treatment.  Pertinent  Medical History   Past Medical History:  Diagnosis Date   ALS (amyotrophic lateral sclerosis) ([place])    COVID-19 virus infection 06/2020   Meningitis 2003   spinal   Morbid obesity ([place])    Prediabetes 11/05/2016   A1C 5.7 on 11/05/16    Significant Hospital Events: Including procedures, antibiotic start and stop dates in addition to other pertinent events   12/09/2021 intubated. Reportedly brief code in the ED with rapid response RN. Arrived in the ICU. Became hypotensive despite vasopressor support. Lost pulse again and CPR performed with ROSC. Bronchoscopy with erythematous airways with scant thin secretions 6/12 s/p trach  Interim History / Subjective:  Patient tolerated tracheostomy well yesterday No overnight issues Remained afebrile  Objective   Blood pressure 120/87, pulse (!) 105, temperature 98.4 F (36.9 C), temperature source Oral, resp. rate (!) 21, weight 56.8 kg, SpO2 98 %.    Vent Mode: PSV;CPAP FiO2 (%):  [40 %-100 %] 40 % Set Rate:  [20 bmp] 20 bmp Vt Set:  [490 mL] 490 mL PEEP:  [5 cmH20] 5 cmH20 Pressure Support:  [10 cmH20] 10 cmH20 Plateau Pressure:  [13 cmH20-18 cmH20] 18 cmH20   Intake/Output Summary (Last 24 hours) at 12/15/2021 0958 Last data filed at  12/15/2021 0941 Gross per 24 hour  Intake 1428.68 ml  Output 1201 ml  Net 227.68 ml   Filed Weights   12/13/21 0500 12/14/21 0500 12/15/21 0500  Weight: 56.8 kg 56.8 kg 56.8 kg   Physical Exam: General: Acute on chronically ill-appearing male, s/p trach HEENT: [place]/AT, eyes anicteric.  ETT and OGT in place Neuro: Awake, intermittently following few commands with blink reflex Chest: Coarse breath sounds, no wheezes or rhonchi Heart: Regular rate and rhythm, no murmurs or gallops Abdomen: Soft, nontender, nondistended, bowel sounds present Skin: No rash  Resolved Hospital Problem list   Sepsis with septic shock Hyponatremia/hypokalemia/hypophosphatemia  Assessment & Plan:  Acute on chronic hypoxic/hypercapnic respiratory failure secondary to progressive neuromuscular disease s/p trach on 6/13 Progressive ALS on home NIV Pneumonia with pansensitive Pseudomonas Continue lung protective ventilation Patient is on pressure support but continuously failing due to low tidal volumes and frequent apnea spells caused by muscular weakness SBT/WUA daily but will be vent dependent in setting of NMD Pulmonary toilet Scheduled for tracheostomy today at 2 PM He is afebrile Continue Zosyn to complete 7-day therapy  S/p in-hospital PEA cardiac arrest ROSC was achieved after 2 minutes of CPR Mental status is back to baseline Continue Tele monitoring  Ileus in the setting of electrolyte imbalance Electrolytes are within normal range KUB still suggestive of ileus Keep potassium >4 and magnesium>2 Patient is having frequent bowel movements  Unstageable bedsore buttock, POA Continue wound care  Best Practice (right click and "Reselect all SmartList Selections" daily)   Diet/type: tubefeeds DVT prophylaxis: LMWH GI  prophylaxis: PPI Lines: [place]: N/A Code Status:  full code Last date of multidisciplinary goals of care discussion [12/14/2021, patient's wife was updated at  bedside]   Critical care time:    Total critical care time: 30 minutes  Performed by: Jacky Kindle   Critical care time was exclusive of separately billable procedures and treating other patients.   Critical care was necessary to treat or prevent imminent or life-threatening deterioration.   Critical care was time spent personally by me on the following activities: development of treatment plan with patient and/or surrogate as well as nursing, discussions with consultants, evaluation of patient's response to treatment, examination of patient, obtaining history from patient or surrogate, ordering and performing treatments and interventions, ordering and review of laboratory studies, ordering and review of radiographic studies, pulse oximetry and re-evaluation of patient's condition.   Jacky Kindle, MD [place] [facility] See Amion for pager If no response to pager, please call [phone] until 7pm After 7pm, Please call E-link [phone]

## 2021-12-16 ENCOUNTER — Inpatient Hospital Stay (HOSPITAL_COMMUNITY): Payer: Medicare HMO

## 2021-12-16 DIAGNOSIS — R092 Respiratory arrest: Secondary | ICD-10-CM | POA: Diagnosis not present

## 2021-12-16 DIAGNOSIS — I469 Cardiac arrest, cause unspecified: Secondary | ICD-10-CM

## 2021-12-16 DIAGNOSIS — A419 Sepsis, unspecified organism: Secondary | ICD-10-CM | POA: Diagnosis not present

## 2021-12-16 DIAGNOSIS — K567 Ileus, unspecified: Secondary | ICD-10-CM | POA: Diagnosis not present

## 2021-12-16 DIAGNOSIS — R6521 Severe sepsis with septic shock: Secondary | ICD-10-CM | POA: Diagnosis not present

## 2021-12-16 DIAGNOSIS — E871 Hypo-osmolality and hyponatremia: Secondary | ICD-10-CM | POA: Diagnosis not present

## 2021-12-16 DIAGNOSIS — J9601 Acute respiratory failure with hypoxia: Secondary | ICD-10-CM

## 2021-12-16 DIAGNOSIS — J9602 Acute respiratory failure with hypercapnia: Secondary | ICD-10-CM | POA: Diagnosis not present

## 2021-12-16 LAB — GLUCOSE, CAPILLARY
Glucose-Capillary: 118 mg/dL — ABNORMAL HIGH (ref 70–99)
Glucose-Capillary: 112 mg/dL — ABNORMAL HIGH (ref 70–99)
Glucose-Capillary: 93 mg/dL (ref 70–99)
Glucose-Capillary: 99 mg/dL (ref 70–99)
Glucose-Capillary: 80 mg/dL (ref 70–99)

## 2021-12-16 MED ORDER — KATE FARMS STANDARD 1.4 PO LIQD
325.0000 mL | Freq: Four times a day (QID) | ORAL | Status: DC
  Administered 2021-12-16: 325 mL
  Administered 2021-12-16: 80 mL
  Administered 2021-12-17 – 2021-12-23 (×25): 325 mL
  Filled 2021-12-16 (×28): qty 325

## 2021-12-16 MED ORDER — DIPHENHYDRAMINE HCL 12.5 MG/5ML PO ELIX
12.5000 mg | ORAL_SOLUTION | Freq: Three times a day (TID) | ORAL | Status: DC | PRN
Start: 2021-12-16 — End: 2022-01-08
  Administered 2021-12-19 – 2021-12-29 (×2): 12.5 mg
  Filled 2021-12-16 (×3): qty 5

## 2021-12-16 MED ORDER — FREE WATER
100.0000 mL | Freq: Four times a day (QID) | Status: DC
  Administered 2021-12-16 – 2021-12-23 (×27): 100 mL

## 2021-12-16 NOTE — Evaluation (Signed)
[facility] Speaking Valve - Evaluation Patient Details  Name: Jerry [name] MRN: [id number] Date of Birth: 09-23-1970  Today's Date: 12/16/2021 Time: 1208-1236 SLP Time Calculation (min) (ACUTE ONLY): 28 min  Past Medical History:  Past Medical History:  Diagnosis Date   ALS (amyotrophic lateral sclerosis) ([place])    COVID-19 virus infection 06/2020   Meningitis 2003   spinal   Morbid obesity ([place])    Prediabetes 11/05/2016   A1C 5.7 on 11/05/16   Past Surgical History:  Past Surgical History:  Procedure Laterality Date   MASS EXCISION Right 11/26/2014   Procedure: EXCISION MASS/GROIN EXCISION MASS;  Surgeon: Robert Bellow, MD;  Location: [facility] ORS;  Service: General;  Laterality: Right;   MASS EXCISION Right 11/26/2014   Procedure: MINOR EXCISION OF MASS/POST THIGH MASS;  Surgeon: Robert Bellow, MD;  Location: [facility] ORS;  Service: General;  Laterality: Right;   PEG PLACEMENT  10/27/2021   PORTACATH PLACEMENT Right 06/19/2019   HPI:  51 yo male admitted 6/7 with ALS (dx 2020) admitted with progressive respiratory decline requiring urgent intubation. Brief code in ED s/p CPR 2 min. Trach and bronch 6/12. PMhx: pre diabetes    Assessment / Plan / Recommendation  Clinical Impression  Spoke with pt's wife earlier this morning who reported he produced soft voice in words and short phrases occasionaly prior to admission. Primary mode of communication is with a [employer] eye gaze system which he is independent with. Seen with RT for inline speaking valve (FUll support) with deflation of cuff, PEEP to zero per RT and donned valve using adaptor for tubing. Took him approximately total of 5 minutes to adequately clear secretions with throat clears and frequent oral suctioning. Expiratory volumes were lower however not in distress. He produced true voice on 2 brief instances and mostly breathy vocalizations. If context is not known, intelligibilty is fair-poor. Therapist able to decipher  son's name (with known context) and daughters with partial intelligibility. Called pt's wife and son on speaker phone and he stated "I love you" which they could understand. He had difficult time with increased inhaled volume given diaphragmatic weakness with ALS. HR 124, RR 22-27, SpO2 100%.Pt became fatigued and valve removed. PMV with SLP only and therapy will continue while in acute care. SLP Visit Diagnosis: Aphonia (R49.1)    SLP Assessment  Patient needs continued Speech [place] [facility]    Recommendations for follow up therapy are one component of a multi-disciplinary discharge planning process, led by the attending physician.  Recommendations may be updated based on patient status, additional functional criteria and insurance authorization.  Follow Up Recommendations   (TBD)    Assistance Recommended at Discharge    Functional Status Assessment Patient has had a recent decline in their functional status and demonstrates the ability to make significant improvements in function in a reasonable and predictable amount of time.  Frequency and Duration min 2x/week  2 weeks    PMSV Trial PMSV was placed for: 20 min Able to redirect subglottic air through upper airway: Yes Able to Attain Phonation:  (2 brief instances, breathy attempts) Voice Quality: Breathy Able to Expectorate Secretions: Yes Level of Secretion Expectoration with PMSV: Oral Breath Support for Phonation: Severely decreased Intelligibility: Intelligibility reduced Word: 0-24% accurate Phrase: 0-24% accurate Respirations During Trial:  (22-27) SpO2 During Trial: 100 % Pulse During Trial:  (123) Behavior: Alert;Controlled;Cooperative;Responsive to questions   Tracheostomy Tube       Vent Dependency  Vent Mode: PRVC Set Rate: 20  bmp PEEP: 5 cmH20 FiO2 (%): 40 % Vt Set: 490 mL    Cuff Deflation Trial Tolerated Cuff Deflation: Yes Length of Time for Cuff Deflation Trial: 28 min Behavior:  Alert;Controlled;Cooperative;Responsive to questions         Houston Siren 12/16/2021, 1:18 PM

## 2021-12-16 NOTE — Progress Notes (Signed)
NAME:  Jerry [name], MRN:  [id number], DOB:  01-29-1971, LOS: 7 ADMISSION DATE:  12/09/2021, CONSULTATION DATE: 12/09/2021 REFERRING MD: Emergency department physician, CHIEF COMPLAINT: Acute on chronic respiratory failure  History of Present Illness:  51 year old male with known ALS which is progressed over the last year to the point that he is wheelchair-bound and is having increasing respiratory distress over the last few weeks and presented to the emergency room 12/09/2021 required urgent intubation.  The patient and family have discussed the possibility of a tracheostomy and want to proceed with that option.  He was admitted to the intensive care unit for further evaluation and treatment.  Pertinent  Medical History    Significant Hospital Events: Including procedures, antibiotic start and stop dates in addition to other pertinent events   12/09/2021 intubated. Reportedly brief code in the ED with rapid response RN. Arrived in the ICU. Became hypotensive despite vasopressor support. Lost pulse again and CPR performed with ROSC. Bronchoscopy with erythematous airways with scant thin secretions 6/12 s/p trach 6/14 rapidly failed SBT  Interim History / Subjective:  Did not tolerate trach collar trial this morning. Became quickly fatigued and lightheaded.   Objective   Blood pressure 107/76, pulse (!) 113, temperature 98.7 F (37.1 C), temperature source Oral, resp. rate 20, weight 56 kg, SpO2 100 %.    Vent Mode: PRVC FiO2 (%):  [35 %-40 %] 40 % Set Rate:  [20 bmp] 20 bmp Vt Set:  [490 mL] 490 mL PEEP:  [5 cmH20] 5 cmH20 Pressure Support:  [10 cmH20] 10 cmH20 Plateau Pressure:  [13 cmH20-17 cmH20] 17 cmH20   Intake/Output Summary (Last 24 hours) at 12/16/2021 1036 Last data filed at 12/16/2021 0600 Gross per 24 hour  Intake 1517.07 ml  Output 1300 ml  Net 217.07 ml    Filed Weights   12/14/21 0500 12/15/21 0500 12/16/21 0309  Weight: 56.8 kg 56.8 kg 56 kg   Physical Exam:   General: Frail middle aged male in NAD HEENT: [place]/AT, PERRL, no JVD Neuro: Awake, alert, communicates appropriately via tablet.  Chest: Clear bilateral breath sounds. No distress on vent.  Heart: RRR, no MRG Abdomen: Soft, NT, ND Skin: Grossly intact   Resolved Hospital Problem list   Sepsis with septic shock Hyponatremia/hypokalemia/hypophosphatemia  Assessment & Plan:  Acute on chronic hypoxic/hypercapnic respiratory failure secondary to progressive neuromuscular disease s/p trach on 6/13 Progressive ALS on home NIV Pneumonia with pansensitive Pseudomonas Continue lung protective ventilation Ongoing ATC trials as tolerated.  In line speaking valve trial today.  Continue Zosyn to complete 7-day therapy He is hopeful he will regain the ability to breath spontaneously. Prognosis in this regard is guarded.   S/p in-hospital PEA cardiac arrest ROSC was achieved after 2 minutes of CPR Mental status is back to baseline Continue tele monitoring  Ileus in the setting of electrolyte imbalance Electrolytes are within normal range KUB still suggestive of ileus Keep potassium >4 and magnesium>2 Patient is having frequent bowel movements  Unstageable bedsore buttock, POA Continue wound care  Best Practice (right click and "Reselect all SmartList Selections" daily)   Diet/type: tubefeeds DVT prophylaxis: LMWH GI prophylaxis: PPI Lines: [place] Foley: N/A Code Status:  full code Last date of multidisciplinary goals of care discussion [12/14/2021, patient's wife was updated at bedside]   Critical care time: 38 minutes     Georgann Housekeeper, AGACNP-BC [place] for personal pager PCCM on call pager [phone] until 7pm. Please call  Elink 7p-7a. [phone]  12/16/2021 10:38 AM

## 2021-12-16 NOTE — Progress Notes (Signed)
RT took pt off the vent and placed pt on ATC 35%, pt tolerated well for about 5-10 minutes and then started moving his eyes and making faces like he was uncomfortable. RT asked pt if he wanted to be placed back on ventilator patient blinked twice (meaning yes) so RT then took ATC off and placed pt back on vent and suctioned him out. Pts vitals were stable the whole time and wasn't in any distress. RT will continue to monitor.

## 2021-12-16 NOTE — TOC Progression Note (Addendum)
Transition of Care [facility]) - Progression Note    Patient Details  Name: Joban Colledge MRN: [id number] Date of Birth: [birth year]  Transition of Care [facility]) CM/SW Contact  Tom-Johnson, Renea Ee, RN Phone Number: 12/16/2021, 3:34 PM  Clinical Narrative:     CM spoke with Marjory Lies at [facility] for home health PT/RN disciplines. Eldin states patient is currently active with them but since patient has a new Trach, he will have to check with their head office to see if they can manage Trach. CM awaiting on Bj's response.  Zach with [employer] notified CM that a scheduled setup home assessment is on 12/18/21. Teaching will be done by Monday 06/19 and then schedule an overnight stay. Wife notified.  CM will continue to follow with needs.   16:00- CM received a call from Marjory Lies with [facility] that thy will not be able to accept referral due to acuity complex of Trach management.  CM called in referral to Rob with [facility] [phone]). Demographics, H&P, Recent progress note, Dietary note and Respiratory notes faxed to [phone]). Awaiting response. CM will continue to follow with needs.    Expected Discharge Plan: [place] Barriers to Discharge: Continued Medical Work up  Expected Discharge Plan and Services Expected Discharge Plan: [place]   Discharge Planning Services: CM Consult Post Acute Care Choice: Home Health, Durable Medical Equipment Living arrangements for the past 2 months: Single Family Home                 DME Arranged: Oxygen ([facility]) DME Agency: [facility] Date DME Agency Contacted: 12/15/21 Time DME Agency Contacted: [phone] Representative spoke with at DME Agency: [place] ([place]) Interventions    Readmission Risk Interventions     No data to display

## 2021-12-16 NOTE — Progress Notes (Signed)
   NAME:  Jerry [name], MRN:  [id number], DOB:  [birth year], LOS: 7 ADMISSION DATE:  12/09/2021, CONSULTATION DATE:  6/7 REFERRING MD: Ralene Bathe EDP , CHIEF COMPLAINT:  Dyspnea   History of Present Illness:  51 y/o male with ALS admitted on 6/7 in the setting of acute hypoxemic respiratory failure requiring intubation and mechanical ventilation.  He had a brief cardiac arrest in the ER around the time of intubation. He had another cardiac arrest in the ICU on the day of admission.    Pertinent  Medical History  ALS COVID-19 Morbid obesity Prediabetes  Significant Hospital Events: Including procedures, antibiotic start and stop dates in addition to other pertinent events   12/09/2021 intubated. Reportedly brief code in the ED with rapid response RN. Arrived in the ICU. Became hypotensive despite vasopressor support. Lost pulse again and CPR performed with ROSC. Bronchoscopy with erythematous airways with scant thin secretions 6/12 s/p trach 6/14 failed ATC after 10 minutes  Interim History / Subjective:   Quiet night Planned ATC but failed this morning after 10 minutes: he said that he was dizzy Has some numbness in his legs that bothers him No issues with tracheostomy  Objective   Blood pressure 102/73, pulse 99, temperature 98.7 F (37.1 C), temperature source Oral, resp. rate 20, weight 56 kg, SpO2 100 %.    Vent Mode: PRVC FiO2 (%):  [40 %] 40 % Set Rate:  [20 bmp] 20 bmp Vt Set:  [490 mL] 490 mL PEEP:  [5 cmH20] 5 cmH20 Pressure Support:  [10 cmH20] 10 cmH20 Plateau Pressure:  [13 cmH20] 13 cmH20   Intake/Output Summary (Last 24 hours) at 12/16/2021 0739 Last data filed at 12/16/2021 0600 Gross per 24 hour  Intake 1726.5 ml  Output 1301 ml  Net 425.5 ml   Filed Weights   12/14/21 0500 12/15/21 0500 12/16/21 0309  Weight: 56.8 kg 56.8 kg 56 kg    Examination:  General:  In bed on vent HENT: NCAT tracheostomy in place PULM: CTA B, vent supported breathing CV: RRR, no  mgr GI: BS+, soft, nontender MSK: diminished bulk, no tone Neuro: awake, answering questions using eye movement tracking software   Resolved Hospital Problem list   Septic shock Hyponatremia Hypokalemia hypophosphatemia  Assessment & Plan:  Acute on chronic hypoxemic and hypercarbic respiratory failure due to progressive neuromuscular disease s/p tracheostomy > failed ATC attempt on 6/14 Progressive ALS on NIV Pneumonia due to pansensitive pseudomonas Check CXR on 6/14 to ensure no underlying pulmonary problem making this worse Full mechanical vent support to continue, hoping for ATC during daytime and vent at night VAP prevention Daily WUA/SBT Zosyn to continue  S/P in hospital PEA cardiac arrest due to respiratory failure Tele monitoring  Ileus in setting of electrolyte imbalance Replace electrolytes Bowel regimen  Unstageable bedsore buttock, POA Wound care to continue  Best Practice (right click and "Reselect all SmartList Selections" daily)   Diet/type: tubefeeds > change back to home regimen of bolus feeds DVT prophylaxis: LMWH GI prophylaxis: PPI Lines: N/A indwelling port Foley:  N/A Code Status:  full code Last date of multidisciplinary goals of care discussion [6/12: on 6/14 she told me that their plans are to go home with a ventilator]  Critical care time: 33 minutes     Roselie Awkward, MD [place] PCCM Pager: [phone] Cell: [phone] After 7:00 pm call Elink  [phone]

## 2021-12-17 DIAGNOSIS — R6521 Severe sepsis with septic shock: Secondary | ICD-10-CM | POA: Diagnosis not present

## 2021-12-17 DIAGNOSIS — A419 Sepsis, unspecified organism: Secondary | ICD-10-CM | POA: Diagnosis not present

## 2021-12-17 LAB — MAGNESIUM: Magnesium: 2.3 mg/dL (ref 1.7–2.4)

## 2021-12-17 LAB — BASIC METABOLIC PANEL
Anion gap: 9 (ref 5–15)
BUN: 9 mg/dL (ref 6–20)
CO2: 21 mmol/L — ABNORMAL LOW (ref 22–32)
Calcium: 8.2 mg/dL — ABNORMAL LOW (ref 8.9–10.3)
Chloride: 103 mmol/L (ref 98–111)
Creatinine, Ser: <0.3 mg/dL — ABNORMAL LOW (ref 0.61–1.24)
Glucose, Bld: 90 mg/dL (ref 70–99)
Potassium: 3.6 mmol/L (ref 3.5–5.1)
Sodium: 133 mmol/L — ABNORMAL LOW (ref 135–145)

## 2021-12-17 LAB — CBC
HCT: 27 % — ABNORMAL LOW (ref 39.0–52.0)
Hemoglobin: 9.1 g/dL — ABNORMAL LOW (ref 13.0–17.0)
MCH: 30.2 pg (ref 26.0–34.0)
MCHC: 33.7 g/dL (ref 30.0–36.0)
MCV: 89.7 fL (ref 80.0–100.0)
Platelets: 169 10*3/uL (ref 150–400)
RBC: 3.01 MIL/uL — ABNORMAL LOW (ref 4.22–5.81)
RDW: 14.9 % (ref 11.5–15.5)
WBC: 4.5 10*3/uL (ref 4.0–10.5)
nRBC: 0 % (ref 0.0–0.2)

## 2021-12-17 LAB — GLUCOSE, CAPILLARY
Glucose-Capillary: 118 mg/dL — ABNORMAL HIGH (ref 70–99)
Glucose-Capillary: 128 mg/dL — ABNORMAL HIGH (ref 70–99)
Glucose-Capillary: 128 mg/dL — ABNORMAL HIGH (ref 70–99)
Glucose-Capillary: 136 mg/dL — ABNORMAL HIGH (ref 70–99)
Glucose-Capillary: 83 mg/dL (ref 70–99)
Glucose-Capillary: 84 mg/dL (ref 70–99)
Glucose-Capillary: 94 mg/dL (ref 70–99)

## 2021-12-17 LAB — PHOSPHORUS: Phosphorus: 3.4 mg/dL (ref 2.5–4.6)

## 2021-12-17 MED ORDER — GUAIFENESIN 100 MG/5ML PO LIQD
15.0000 mL | Freq: Four times a day (QID) | ORAL | Status: DC
  Administered 2021-12-17 – 2022-01-08 (×87): 15 mL
  Filled 2021-12-17 (×87): qty 15

## 2021-12-17 MED ORDER — SODIUM CHLORIDE 3 % IN NEBU
4.0000 mL | INHALATION_SOLUTION | Freq: Two times a day (BID) | RESPIRATORY_TRACT | Status: DC
Start: 2021-12-17 — End: 2021-12-23
  Administered 2021-12-17 – 2021-12-22 (×12): 4 mL via RESPIRATORY_TRACT
  Filled 2021-12-17 (×13): qty 4

## 2021-12-17 MED ORDER — POTASSIUM CHLORIDE 20 MEQ PO PACK
40.0000 meq | PACK | Freq: Once | ORAL | Status: AC
  Administered 2021-12-17: 40 meq
  Filled 2021-12-17: qty 2

## 2021-12-17 MED ORDER — ORAL CARE MOUTH RINSE: 15.0000 mL | OROMUCOSAL | Status: DC | PRN

## 2021-12-17 MED ORDER — ORAL CARE MOUTH RINSE
15.0000 mL | OROMUCOSAL | Status: DC
  Administered 2021-12-17: 15 mL via OROMUCOSAL

## 2021-12-17 MED ORDER — LACTATED RINGERS IV BOLUS
500.0000 mL | Freq: Once | INTRAVENOUS | Status: AC
  Administered 2021-12-17: 500 mL via INTRAVENOUS

## 2021-12-17 NOTE — Progress Notes (Signed)
SLP Cancellation Note  Patient Details Name: Jerry [name] MRN: [id number] DOB: [birth year]   Cancelled treatment:        Pt not feeling as well today and pt requested we defer inline PMV until tomorrow.    [place] Siren 12/17/2021, 11:27 AM

## 2021-12-17 NOTE — Progress Notes (Signed)
[facility] ADULT ICU REPLACEMENT PROTOCOL   The patient does apply for the [facility] Adult ICU Electrolyte Replacment Protocol based on the criteria listed below:   1.Exclusion criteria: TCTS patients, ECMO patients, and Dialysis patients 2. Is GFR >/= 30 ml/min? No. Not calculated Patient's GFR today is not calculated 3. Is SCr </= 2? Yes.   Patient's SCr is <0.30 mg/dL 4. Did SCr increase >/= 0.5 in 24 hours? No. 5.Pt's weight >40kg  Yes.   6. Abnormal electrolyte(s): potassium 3.6  7. Electrolytes replaced per protocol 8.  Call MD STAT for K+ </= 2.5, Phos </= 1, or Mag </= 1 Physician:  n/a  Darlys Gales 12/17/2021 4:39 AM

## 2021-12-17 NOTE — Progress Notes (Signed)
   NAME:  Jerry [name], MRN:  [id number], DOB:  [birth year], LOS: 8 ADMISSION DATE:  12/09/2021, CONSULTATION DATE:  6/7 REFERRING MD: Ralene Bathe EDP , CHIEF COMPLAINT:  Dyspnea   History of Present Illness:  51 y/o male with ALS admitted on 6/7 in the setting of acute hypoxemic respiratory failure requiring intubation and mechanical ventilation.  He had a brief cardiac arrest in the ER around the time of intubation. He had another cardiac arrest in the ICU on the day of admission.    Pertinent  Medical History  ALS COVID-19 Morbid obesity Prediabetes  Significant Hospital Events: Including procedures, antibiotic start and stop dates in addition to other pertinent events   12/09/2021 intubated. Reportedly brief code in the ED with rapid response RN. Arrived in the ICU. Became hypotensive despite vasopressor support. Lost pulse again and CPR performed with ROSC. Bronchoscopy with erythematous airways with scant thin secretions 6/12 s/p trach 6/14 failed ATC after 10 minutes  Interim History / Subjective:   Refused pressure support wean this morning Had a bowel movement yesterday   Objective   Blood pressure 99/62, pulse 95, temperature 98.3 F (36.8 C), temperature source Oral, resp. rate 20, weight 56 kg, SpO2 100 %.    Vent Mode: PRVC FiO2 (%):  [30 %-40 %] 30 % Set Rate:  [20 bmp] 20 bmp Vt Set:  [490 mL] 490 mL PEEP:  [5 cmH20] 5 cmH20 Plateau Pressure:  [13 cmH20-17 cmH20] 16 cmH20   Intake/Output Summary (Last 24 hours) at 12/17/2021 1018 Last data filed at 12/17/2021 0700 Gross per 24 hour  Intake 434.85 ml  Output 1550 ml  Net -1115.15 ml   Filed Weights   12/14/21 0500 12/15/21 0500 12/16/21 0309  Weight: 56.8 kg 56.8 kg 56 kg    Examination:  General:  In bed on vent HENT: NCAT tracheostomy in place PULM: CTA B, vent supported breathing CV: RRR, no mgr GI: BS+, soft, nontender MSK: essentially no bulk and tone Neuro: awake, alert  6/14 CXR RLL atelectasis vs  pleural effusion  Resolved Hospital Problem list   Septic shock Hyponatremia Hypokalemia hypophosphatemia  Assessment & Plan:  Acute on chronic hypoxemic and hypercarbic respiratory failure due to progressive neuromuscular disease s/p tracheostomy > failed ATC attempt on 6/14 Progressive ALS on NIV Pneumonia due to pansensitive pseudomonas Zosyn through tomorrow Add hypertonic saline, guaifenesin, chest PT today, RT to bag lavage qshift May need bronchoscopy tomorrow if RLL collapse not improved on repeat CXR Full mechanical vent support to continue VAP prevention Daily WUA/SBT He will need full time mechanical ventilation at home, will discuss with TOC  S/P in hospital PEA cardiac arrest due to respiratory failure Tele monitoring  Ileus in setting of electrolyte imbalance Replace electrolytes Bowel regimen to continue  Unstageable bedsore buttock, POA Wound care to continue  Best Practice (right click and "Reselect all SmartList Selections" daily)   Diet/type: tubefeeds > change back to home regimen of bolus feeds DVT prophylaxis: LMWH GI prophylaxis: PPI Lines: N/A indwelling port Foley:  N/A Code Status:  full code Last date of multidisciplinary goals of care discussion [6/12: on 6/14 she told me that their plans are to go home with a ventilator]  Critical care time: 31 minutes     Roselie Awkward, MD [place] PCCM Pager: [phone] Cell: [phone] After 7:00 pm call Elink  [phone]

## 2021-12-18 ENCOUNTER — Inpatient Hospital Stay (HOSPITAL_COMMUNITY): Payer: Medicare HMO

## 2021-12-18 DIAGNOSIS — A419 Sepsis, unspecified organism: Secondary | ICD-10-CM | POA: Diagnosis not present

## 2021-12-18 DIAGNOSIS — R6521 Severe sepsis with septic shock: Secondary | ICD-10-CM | POA: Diagnosis not present

## 2021-12-18 LAB — CBC WITH DIFFERENTIAL/PLATELET
Abs Immature Granulocytes: 0.02 10*3/uL (ref 0.00–0.07)
Basophils Absolute: 0 10*3/uL (ref 0.0–0.1)
Basophils Relative: 0 %
Eosinophils Absolute: 0.1 10*3/uL (ref 0.0–0.5)
Eosinophils Relative: 1 %
HCT: 25.6 % — ABNORMAL LOW (ref 39.0–52.0)
Hemoglobin: 8.5 g/dL — ABNORMAL LOW (ref 13.0–17.0)
Immature Granulocytes: 0 %
Lymphocytes Relative: 15 %
Lymphs Abs: 0.9 10*3/uL (ref 0.7–4.0)
MCH: 30.2 pg (ref 26.0–34.0)
MCHC: 33.2 g/dL (ref 30.0–36.0)
MCV: 91.1 fL (ref 80.0–100.0)
Monocytes Absolute: 0.4 10*3/uL (ref 0.1–1.0)
Monocytes Relative: 6 %
Neutro Abs: 4.9 10*3/uL (ref 1.7–7.7)
Neutrophils Relative %: 78 %
Platelets: 183 10*3/uL (ref 150–400)
RBC: 2.81 MIL/uL — ABNORMAL LOW (ref 4.22–5.81)
RDW: 15.1 % (ref 11.5–15.5)
WBC: 6.3 10*3/uL (ref 4.0–10.5)
nRBC: 0 % (ref 0.0–0.2)

## 2021-12-18 LAB — COMPREHENSIVE METABOLIC PANEL
ALT: 29 U/L (ref 0–44)
AST: 15 U/L (ref 15–41)
Albumin: 2.4 g/dL — ABNORMAL LOW (ref 3.5–5.0)
Alkaline Phosphatase: 47 U/L (ref 38–126)
Anion gap: 7 (ref 5–15)
BUN: 8 mg/dL (ref 6–20)
CO2: 24 mmol/L (ref 22–32)
Calcium: 8.4 mg/dL — ABNORMAL LOW (ref 8.9–10.3)
Chloride: 108 mmol/L (ref 98–111)
Creatinine, Ser: <0.3 mg/dL — ABNORMAL LOW (ref 0.61–1.24)
Glucose, Bld: 90 mg/dL (ref 70–99)
Potassium: 3.8 mmol/L (ref 3.5–5.1)
Sodium: 139 mmol/L (ref 135–145)
Total Bilirubin: 0.8 mg/dL (ref 0.3–1.2)
Total Protein: 4.9 g/dL — ABNORMAL LOW (ref 6.5–8.1)

## 2021-12-18 LAB — GLUCOSE, CAPILLARY
Glucose-Capillary: 82 mg/dL (ref 70–99)
Glucose-Capillary: 86 mg/dL (ref 70–99)
Glucose-Capillary: 106 mg/dL — ABNORMAL HIGH (ref 70–99)
Glucose-Capillary: 100 mg/dL — ABNORMAL HIGH (ref 70–99)
Glucose-Capillary: 125 mg/dL — ABNORMAL HIGH (ref 70–99)
Glucose-Capillary: 135 mg/dL — ABNORMAL HIGH (ref 70–99)

## 2021-12-18 MED ORDER — MIDAZOLAM HCL 2 MG/2ML IJ SOLN
4.0000 mg | Freq: Once | INTRAMUSCULAR | Status: AC
  Administered 2021-12-18: 2 mg via INTRAVENOUS
  Filled 2021-12-18: qty 4

## 2021-12-18 MED ORDER — FENTANYL CITRATE (PF) 100 MCG/2ML IJ SOLN
200.0000 ug | Freq: Once | INTRAMUSCULAR | Status: AC
  Administered 2021-12-18: 75 ug via INTRAVENOUS
  Filled 2021-12-18: qty 4

## 2021-12-18 MED ORDER — ORAL CARE MOUTH RINSE
15.0000 mL | OROMUCOSAL | Status: DC
  Administered 2021-12-18 – 2022-01-08 (×248): 15 mL via OROMUCOSAL

## 2021-12-18 NOTE — Progress Notes (Signed)
   NAME:  Jerry [name], MRN:  [id number], DOB:  [birth year], LOS: 9 ADMISSION DATE:  12/09/2021, CONSULTATION DATE:  6/7 REFERRING MD: Ralene Bathe EDP , CHIEF COMPLAINT:  Dyspnea   History of Present Illness:  51 y/o male with ALS admitted on 6/7 in the setting of acute hypoxemic respiratory failure requiring intubation and mechanical ventilation.  He had a brief cardiac arrest in the ER around the time of intubation. He had another cardiac arrest in the ICU on the day of admission.    Pertinent  Medical History  ALS COVID-19 Morbid obesity Prediabetes  Significant Hospital Events: Including procedures, antibiotic start and stop dates in addition to other pertinent events   12/09/2021 intubated. Reportedly brief code in the ED with rapid response RN. Arrived in the ICU. Became hypotensive despite vasopressor support. Lost pulse again and CPR performed with ROSC. Bronchoscopy with erythematous airways with scant thin secretions 6/12 s/p trach 6/14 failed ATC after 10 minutes  Interim History / Subjective:   Refused pressure support wean this morning  Objective   Blood pressure 102/69, pulse 88, temperature 98.5 F (36.9 C), temperature source Oral, resp. rate 20, weight 47.9 kg, SpO2 99 %.    Vent Mode: PRVC FiO2 (%):  [30 %] 30 % Set Rate:  [20 bmp] 20 bmp Vt Set:  [490 mL] 490 mL PEEP:  [5 cmH20] 5 cmH20 Plateau Pressure:  [16 cmH20-19 cmH20] 19 cmH20   Intake/Output Summary (Last 24 hours) at 12/18/2021 1914 Last data filed at 12/18/2021 0600 Gross per 24 hour  Intake 779.13 ml  Output 1350 ml  Net -570.87 ml   Filed Weights   12/15/21 0500 12/16/21 0309 12/18/21 0322  Weight: 56.8 kg 56 kg 47.9 kg    Examination:  General:  In bed on vent HENT: NCAT tracheostomy in place PULM: CTA B, vent supported breathing CV: RRR, no mgr GI: BS+, soft, nontender MSK: diminished bulk and tone Neuro: awake   6/15 CXR RLL atelectasis is worse today  Resolved Hospital Problem list    Septic shock Hyponatremia Hypokalemia Hypophosphatemia Ileus in setting of electrolyte imbalance   Assessment & Plan:  Acute on chronic hypoxemic and hypercarbic respiratory failure due to progressive neuromuscular disease s/p tracheostomy > failed ATC attempt on 6/14 Progressive ALS on NIV Pneumonia due to pansensitive pseudomonas Atelectasis, right lower lobe collapse Zosyn through today Plan bronchoscopy this morning for airway clearance Full mechanical vent support to contiue VAP prevention Daily WUA/SBT Needs home vent  S/P in hospital PEA cardiac arrest due to respiratory failure Tele monitoring  Unstageable bedsore buttock, POA Wound care to continue  Best Practice (right click and "Reselect all SmartList Selections" daily)   Diet/type: tubefeeds > change back to home regimen of bolus feeds DVT prophylaxis: LMWH GI prophylaxis: PPI Lines: N/A indwelling port Foley:  N/A Code Status:  full code Last date of multidisciplinary goals of care discussion [6/12: on 6/14 she told me that their plans are to go home with a ventilator]  Critical care time: 32 minutes     Roselie Awkward, MD [place] PCCM Pager: [phone] Cell: [phone] After 7:00 pm call Elink  [phone]

## 2021-12-18 NOTE — Progress Notes (Signed)
SLP Cancellation Note  Patient Details Name: Jerry [name] MRN: [id number] DOB: 04-21-1971   Cancelled treatment:        Planned to see pt for possible inline PMV at 11:00 however pt getting ready to have his bronchoscopy. Will continue efforts beginning next week.    [place] Siren 12/18/2021, 10:55 AM

## 2021-12-18 NOTE — TOC Progression Note (Signed)
Transition of Care [facility]) - Progression Note    Patient Details  Name: Jerry [name] MRN: [id number] Date of Birth: 02/15/1971  [facility]) CM/SW Contact  Tom-Johnson, Renea Ee, RN Phone Number: 12/18/2021, 4:35 PM  Clinical Narrative:     Rob with [facility] [phone]) acknowledged receipt of documents faxed. Rob came to see patient and wife at bedside. CM will continue to follow with needs.    Expected Discharge Plan: [place] Barriers to Discharge: Continued Medical Work up  Expected Discharge Plan and Services Expected Discharge Plan: [place]   Discharge Planning Services: CM Consult Post Acute Care Choice: Home Health, Durable Medical Equipment Living arrangements for the past 2 months: Single Family Home                 DME Arranged: Oxygen ([facility]) DME Agency: [facility] Date DME Agency Contacted: 12/15/21 Time DME Agency Contacted: [phone] Representative spoke with at DME Agency: [place] ([place]) Interventions    Readmission Risk Interventions     No data to display

## 2021-12-18 NOTE — Procedures (Signed)
Bronchoscopy Procedure Note  Dmani Mizer  [id number]  04/22/1971  Date:12/18/21  Time:11:19 AM   Provider Performing:Brent Augustine Brannick   Procedure(s):  Flexible Bronchoscopy [phone]) and Initial Therapeutic Aspiration of Tracheobronchial Tree [phone])  Indication(s) Right lower lobe collapse  Consent Risks of the procedure as well as the alternatives and risks of each were explained to the patient and/or caregiver.  Consent for the procedure was obtained and is signed in the bedside chart  Anesthesia Moderate sedation: versed 41m fentanyl 761m IV   Time Out Verified patient identification, verified procedure, site/side was marked, verified correct patient position, special equipment/implants available, medications/allergies/relevant history reviewed, required imaging and test results available.   Sterile Technique Usual hand hygiene, masks, gowns, and gloves were used   Procedure Description Bronchoscope advanced through endotracheal tube and into airway.  Airways were examined down to subsegmental level with findings noted below.   Following diagnostic evaluation, Therapeutic aspiration performed in right lower lobe and right middle lobe  Findings: The trachea was normal and the carina was sharp. The left tracheobronchial tree was normal in appearance.  The right mainstem bronchus and upper lobe was normal but the bronchus intermedius, right middle lobe and right lower lobe (and all subsegments visualized) were occluded with thick grey mucus.  This was removed with suctioning and sent for culture.   Complications/Tolerance None; patient tolerated the procedure well. Chest X-ray is not needed post procedure.   EBL Minimal   Specimen(s) Bronch aspirate sent for culture  BrRoselie AwkwardMD [place] PCCM Pager: [phone]: [phone] 7:00 pm call Elink  [phone]

## 2021-12-18 NOTE — Progress Notes (Signed)
Brief Nutrition Note  Discussed pt with RN and during ICU rounds. Spoke with pt's wife regarding tube feeds.  Per RN, pt tolerating current bolus tube feeding regimen well. Discussed importance of administering tube feeds to gravity (no plunging) to promote tolerance. Pt's wife reports that this is the way she was administering feedings at home.  Provided pt's wife with pt's current bolus tube feeding regimen including timing of feeds as well as free water flushes. Pt's wife with questions about additional products, including "Heal in a Meal" which makes an enteral product. Encouraged pt's wife to make sure that pt receives full [facility] Standard 1.4 tube feeding regimen to ensure that his kcal and protein needs are met.  Continue bolus tube feeding regimen via PEG: - 1 carton (325 ml) [employer] [place] Standard 1.4 formula QID at 0800, 1200, 1600, and 2000 - Flush PEG with 50 ml free water before and after each bolus feed   Tube feeding regimen provides 1820 kcal, 80 grams of protein, and 936 ml of H2O.    Total free water with flushes: 1336 ml  All of pt's wife's questions answered. RD will continue to follow pt during acute admission.   Gustavus Bryant, MS, RD, LDN Inpatient Clinical Dietitian Please see AMiON for contact information.

## 2021-12-18 NOTE — Progress Notes (Signed)
Patient seen today by trach team for consult.  No education is needed at this time.  All the necessary equipment is at the beside.   Will continue to follow for progression. The patient is still on full ventilatory support.  

## 2021-12-18 NOTE — Plan of Care (Signed)
  Problem: Activity: Goal: Risk for activity intolerance will decrease Outcome: Progressing   Problem: Nutrition: Goal: Adequate nutrition will be maintained Outcome: Progressing   Problem: Coping: Goal: Level of anxiety will decrease Outcome: Progressing   Problem: Safety: Goal: Ability to remain free from injury will improve Outcome: Progressing   Problem: Skin Integrity: Goal: Risk for impaired skin integrity will decrease Outcome: Progressing   

## 2021-12-19 DIAGNOSIS — A419 Sepsis, unspecified organism: Secondary | ICD-10-CM | POA: Diagnosis not present

## 2021-12-19 DIAGNOSIS — R6521 Severe sepsis with septic shock: Secondary | ICD-10-CM | POA: Diagnosis not present

## 2021-12-19 LAB — CBC WITH DIFFERENTIAL/PLATELET
Abs Immature Granulocytes: 0.02 10*3/uL (ref 0.00–0.07)
Basophils Absolute: 0 10*3/uL (ref 0.0–0.1)
Basophils Relative: 0 %
Eosinophils Absolute: 0 10*3/uL (ref 0.0–0.5)
Eosinophils Relative: 1 %
HCT: 26.5 % — ABNORMAL LOW (ref 39.0–52.0)
Hemoglobin: 8.9 g/dL — ABNORMAL LOW (ref 13.0–17.0)
Immature Granulocytes: 0 %
Lymphocytes Relative: 25 %
Lymphs Abs: 1.4 10*3/uL (ref 0.7–4.0)
MCH: 30.9 pg (ref 26.0–34.0)
MCHC: 33.6 g/dL (ref 30.0–36.0)
MCV: 92 fL (ref 80.0–100.0)
Monocytes Absolute: 0.4 10*3/uL (ref 0.1–1.0)
Monocytes Relative: 6 %
Neutro Abs: 3.8 10*3/uL (ref 1.7–7.7)
Neutrophils Relative %: 68 %
Platelets: 213 10*3/uL (ref 150–400)
RBC: 2.88 MIL/uL — ABNORMAL LOW (ref 4.22–5.81)
RDW: 15.2 % (ref 11.5–15.5)
WBC: 5.7 10*3/uL (ref 4.0–10.5)
nRBC: 0 % (ref 0.0–0.2)

## 2021-12-19 LAB — COMPREHENSIVE METABOLIC PANEL
ALT: 27 U/L (ref 0–44)
AST: 14 U/L — ABNORMAL LOW (ref 15–41)
Albumin: 2.5 g/dL — ABNORMAL LOW (ref 3.5–5.0)
Alkaline Phosphatase: 54 U/L (ref 38–126)
Anion gap: 5 (ref 5–15)
BUN: 8 mg/dL (ref 6–20)
CO2: 24 mmol/L (ref 22–32)
Calcium: 8.3 mg/dL — ABNORMAL LOW (ref 8.9–10.3)
Chloride: 106 mmol/L (ref 98–111)
Creatinine, Ser: <0.3 mg/dL — ABNORMAL LOW (ref 0.61–1.24)
Glucose, Bld: 90 mg/dL (ref 70–99)
Potassium: 3.8 mmol/L (ref 3.5–5.1)
Sodium: 135 mmol/L (ref 135–145)
Total Bilirubin: 0.4 mg/dL (ref 0.3–1.2)
Total Protein: 5.4 g/dL — ABNORMAL LOW (ref 6.5–8.1)

## 2021-12-19 LAB — GLUCOSE, CAPILLARY
Glucose-Capillary: 149 mg/dL — ABNORMAL HIGH (ref 70–99)
Glucose-Capillary: 86 mg/dL (ref 70–99)
Glucose-Capillary: 86 mg/dL (ref 70–99)
Glucose-Capillary: 90 mg/dL (ref 70–99)
Glucose-Capillary: 92 mg/dL (ref 70–99)
Glucose-Capillary: 95 mg/dL (ref 70–99)

## 2021-12-19 MED ORDER — HEPARIN SOD (PORK) LOCK FLUSH 100 UNIT/ML IV SOLN
500.0000 [IU] | INTRAVENOUS | Status: DC | PRN
  Administered 2021-12-19 – 2021-12-26 (×2): 500 [IU]
  Filled 2021-12-19: qty 5

## 2021-12-19 MED ORDER — AZITHROMYCIN 500 MG PO TABS
250.0000 mg | ORAL_TABLET | Freq: Every day | ORAL | Status: DC
  Administered 2021-12-19 – 2021-12-20 (×2): 250 mg via ORAL
  Filled 2021-12-19 (×2): qty 1

## 2021-12-19 MED ORDER — ALUM & MAG HYDROXIDE-SIMETH 200-200-20 MG/5ML PO SUSP
30.0000 mL | ORAL | Status: DC | PRN
  Administered 2021-12-19 – 2021-12-28 (×3): 30 mL
  Filled 2021-12-19 (×3): qty 30

## 2021-12-19 MED ORDER — TOBRAMYCIN 300 MG/5ML IN NEBU
300.0000 mg | INHALATION_SOLUTION | Freq: Two times a day (BID) | RESPIRATORY_TRACT | Status: DC
  Administered 2021-12-19 – 2022-01-08 (×40): 300 mg via RESPIRATORY_TRACT
  Filled 2021-12-19 (×44): qty 5

## 2021-12-19 MED ORDER — HEPARIN SOD (PORK) LOCK FLUSH 100 UNIT/ML IV SOLN
500.0000 [IU] | INTRAVENOUS | Status: DC
  Filled 2021-12-19: qty 5

## 2021-12-19 NOTE — Progress Notes (Addendum)
   NAME:  Jerry [name], MRN:  [id number], DOB:  1970/09/17, LOS: 51 ADMISSION DATE:  12/09/2021, CONSULTATION DATE:  6/7 REFERRING MD: Ralene Bathe EDP , CHIEF COMPLAINT:  Dyspnea   History of Present Illness:  51 y/o male with ALS admitted on 6/7 in the setting of acute hypoxemic respiratory failure requiring intubation and mechanical ventilation.  He had a brief cardiac arrest in the ER around the time of intubation. He had another cardiac arrest in the ICU on the day of admission.    Pertinent  Medical History  ALS COVID-19 Morbid obesity Prediabetes  Significant Hospital Events: Including procedures, antibiotic start and stop dates in addition to other pertinent events   12/09/2021 intubated. Reportedly brief code in the ED with rapid response RN. Arrived in the ICU. Became hypotensive despite vasopressor support. Lost pulse again and CPR performed with ROSC. Bronchoscopy with erythematous airways with scant thin secretions 6/12 s/p trach 6/14 failed ATC after 10 minutes  Interim History / Subjective:   Awake on vent Concerned because his tracheostomy popped off last night and his neck was hyperextended Feels less mucus in his chest  Objective   Blood pressure 104/78, pulse 86, temperature 98.4 F (36.9 C), temperature source Oral, resp. rate 20, weight 47.6 kg, SpO2 99 %.    Vent Mode: PRVC FiO2 (%):  [30 %] 30 % Set Rate:  [20 bmp] 20 bmp Vt Set:  [490 mL] 490 mL PEEP:  [5 cmH20] 5 cmH20 Plateau Pressure:  [17 cmH20-19 cmH20] 17 cmH20   Intake/Output Summary (Last 24 hours) at 12/19/2021 0718 Last data filed at 12/19/2021 0500 Gross per 24 hour  Intake 968.48 ml  Output 500 ml  Net 468.48 ml   Filed Weights   12/16/21 0309 12/18/21 0322 12/19/21 0500  Weight: 56 kg 47.9 kg 47.6 kg    Examination:  General:  In bed on vent HENT: NCAT tracheostomy in place PULM: CTA B, vent supported breathing CV: RRR, no mgr GI: BS+, soft, nontender MSK: normal bulk and tone Neuro:  sedated on vent  6/15 CXR RLL atelectasis is worse today  Resolved Hospital Problem list   Septic shock Hyponatremia Hypokalemia Hypophosphatemia Ileus in setting of electrolyte imbalance  S/P in hospital PEA cardiac arrest due to respiratory failure Atelectasis, right lower lobe collapse  Assessment & Plan:  Acute on chronic hypoxemic and hypercarbic respiratory failure due to progressive neuromuscular disease s/p tracheostomy > failed ATC attempt on 6/14 Progressive ALS on NIV Pneumonia due to pansensitive pseudomonas > improved pneumonia but has persistently positive tracheobronchitis Thick pulmonary secretions, at risk for RLL collapse Unstageable bedsore buttock, POA Full mechanical vent support VAP prevention Trach care per routine Inline speaking valve through vent Keep neck in position that maintains vent in place Start daily azithromycin and nebulized tobi for pseudomonas colonization/bronchitis Hypertonic saline to continue  Guaifenesin to continue Continue to pursue home vent  Best Practice (right click and "Reselect all SmartList Selections" daily)   Diet/type: tubefeeds > change back to home regimen of bolus feeds DVT prophylaxis: LMWH GI prophylaxis: PPI Lines: N/A indwelling port Foley:  N/A Code Status:  full code Last date of multidisciplinary goals of care discussion [6/12: on 6/14 she told me that their plans are to go home with a ventilator]  Critical care time: n/a     Roselie Awkward, MD [place] [facility] Pager: [phone] Cell: [phone] After 7:00 pm call Elink  [phone]

## 2021-12-19 NOTE — Progress Notes (Signed)
[facility] Progress Note Patient Name: Jerry [name] DOB: [birth year] MRN: [id number]   Date of Service  12/19/2021  HPI/Events of Note  Patient with dyspepsia and bloating.  eICU Interventions  PRN Mylanta ordered.        Kerry Kass [name] 12/19/2021, 12:44 AM

## 2021-12-20 DIAGNOSIS — A419 Sepsis, unspecified organism: Secondary | ICD-10-CM | POA: Diagnosis not present

## 2021-12-20 DIAGNOSIS — R6521 Severe sepsis with septic shock: Secondary | ICD-10-CM | POA: Diagnosis not present

## 2021-12-20 LAB — GLUCOSE, CAPILLARY
Glucose-Capillary: 115 mg/dL — ABNORMAL HIGH (ref 70–99)
Glucose-Capillary: 130 mg/dL — ABNORMAL HIGH (ref 70–99)
Glucose-Capillary: 72 mg/dL (ref 70–99)
Glucose-Capillary: 82 mg/dL (ref 70–99)
Glucose-Capillary: 83 mg/dL (ref 70–99)
Glucose-Capillary: 84 mg/dL (ref 70–99)

## 2021-12-20 LAB — CULTURE, RESPIRATORY W GRAM STAIN

## 2021-12-20 LAB — CBC
HCT: 26.8 % — ABNORMAL LOW (ref 39.0–52.0)
Hemoglobin: 8.9 g/dL — ABNORMAL LOW (ref 13.0–17.0)
MCH: 30.8 pg (ref 26.0–34.0)
MCHC: 33.2 g/dL (ref 30.0–36.0)
MCV: 92.7 fL (ref 80.0–100.0)
Platelets: 199 10*3/uL (ref 150–400)
RBC: 2.89 MIL/uL — ABNORMAL LOW (ref 4.22–5.81)
RDW: 15.2 % (ref 11.5–15.5)
WBC: 5.6 10*3/uL (ref 4.0–10.5)
nRBC: 0 % (ref 0.0–0.2)

## 2021-12-20 LAB — BASIC METABOLIC PANEL
Anion gap: 3 — ABNORMAL LOW (ref 5–15)
BUN: 12 mg/dL (ref 6–20)
CO2: 23 mmol/L (ref 22–32)
Calcium: 8 mg/dL — ABNORMAL LOW (ref 8.9–10.3)
Chloride: 111 mmol/L (ref 98–111)
Creatinine, Ser: <0.3 mg/dL — ABNORMAL LOW (ref 0.61–1.24)
Glucose, Bld: 148 mg/dL — ABNORMAL HIGH (ref 70–99)
Potassium: 4.5 mmol/L (ref 3.5–5.1)
Sodium: 137 mmol/L (ref 135–145)

## 2021-12-20 MED ORDER — AZITHROMYCIN 500 MG PO TABS
250.0000 mg | ORAL_TABLET | Freq: Every day | ORAL | Status: DC
  Administered 2021-12-21 – 2022-01-04 (×15): 250 mg
  Filled 2021-12-20 (×15): qty 1

## 2021-12-20 MED ORDER — PANTOPRAZOLE 2 MG/ML SUSPENSION
40.0000 mg | Freq: Every day | ORAL | Status: DC
  Administered 2021-12-20 – 2022-01-08 (×20): 40 mg
  Filled 2021-12-20 (×20): qty 20

## 2021-12-20 NOTE — Progress Notes (Signed)
Speech Language Pathology Treatment: Jerry [name] Speaking valve  Patient Details Name: Jerry [name] MRN: [id number] DOB: 1970-09-25 Today's Date: 12/20/2021 Time: 3664-4034 SLP Time Calculation (min) (ACUTE ONLY): 41 min  Assessment / Plan / Recommendation Clinical Impression  RT was available to collaborate with SLP for inline PMV placement this afternoon. Mrs. Jerry [name] was present for trial. Procedure/function of valve explained to pt and his wife. Cuff was slowly deflated to allow pt to adjust to airflow changes through upper airway.  Patency ensured - valve placed, PEEP dropped to 0 and RT made necessary ventilator modifications.  [name] practiced oral exhalation through pursed lips, then produced easy-onset phonation, then counted and stated DOW at rate of one word per exhalation. Volume of phonation low; speech intelligibility compromised due to baseline severe dysarthria.  Tongue atrophy/fasciculations evident - bulbar involvement as his disease has progressed makes oral communication difficult. However, he is able to articulate phonetically simpler words and be understood by familiar communication partners.    Session ended; cuff inflated and vent restored to prior settings per RT. Pt will benefit from ongoing practice with inline valve - he may use valve with RT without presence of SLP required.   HPI HPI: [name] is a 51 yo male with hx significant for limb-onset ALS with progressive quadriparesis, anarthria and dysphagia (dx 2020). Admitted 6/7 with progressive respiratory decline, requiring urgent intubation. Brief code in ED s/p CPR 2 min. Trach and bronch 6/12. On ventilator. PEG 10/26/21 due to progressive dysphagia.  Pt participates in the ALS clinic at [facility].      SLP Plan  Continue with current plan of care      Recommendations for follow up therapy are one component of a multi-disciplinary discharge planning process, led by the attending physician.   Recommendations may be updated based on patient status, additional functional criteria and insurance authorization.    Recommendations         Patient may use Passy-Muir Speech Valve:  (with SLP or RT) PMSV Supervision: Full         Oral Care Recommendations: Oral care QID Assistance recommended at discharge: Frequent or constant Supervision/Assistance SLP Visit Diagnosis: Aphonia (R49.1) Plan: Continue with current plan of care        Jerry [name] L. Jerry Ringer, MA CCC/SLP Clinical Specialist - Acute Care SLP Acute Rehabilitation Services Office number [phone]    Jerry [name] Jerry [name]  12/20/2021, 1:19 PM

## 2021-12-20 NOTE — Progress Notes (Signed)
   NAME:  Jerry [name], MRN:  [id number], DOB:  1970-11-06, LOS: 59 ADMISSION DATE:  12/09/2021, CONSULTATION DATE:  6/7 REFERRING MD: Ralene Bathe EDP , CHIEF COMPLAINT:  Dyspnea   History of Present Illness:  51 y/o male with ALS admitted on 6/7 in the setting of acute hypoxemic respiratory failure requiring intubation and mechanical ventilation.  He had a brief cardiac arrest in the ER around the time of intubation. He had another cardiac arrest in the ICU on the day of admission.    Pertinent  Medical History  ALS COVID-19 Morbid obesity Prediabetes  Significant Hospital Events: Including procedures, antibiotic start and stop dates in addition to other pertinent events   12/09/2021 intubated. Reportedly brief code in the ED with rapid response RN. Arrived in the ICU. Became hypotensive despite vasopressor support. Lost pulse again and CPR performed with ROSC. Bronchoscopy with erythematous airways with scant thin secretions 6/12 s/p trach 6/14 failed ATC after 10 minutes 6/16 bronchoscopy> thick mucus removed from RLL; culture sent> pseudomonas 6/17 started inhaled tobi, daily azithro  Interim History / Subjective:   No acute events  Objective   Blood pressure 90/65, pulse 78, temperature 99 F (37.2 C), temperature source Oral, resp. rate 20, weight 47.6 kg, SpO2 99 %.    Vent Mode: PRVC FiO2 (%):  [30 %] 30 % Set Rate:  [20 bmp] 20 bmp Vt Set:  [490 mL] 490 mL PEEP:  [5 cmH20] 5 cmH20 Pressure Support:  [10 cmH20] 10 cmH20 Plateau Pressure:  [15 cmH20-17 cmH20] 17 cmH20   Intake/Output Summary (Last 24 hours) at 12/20/2021 3810 Last data filed at 12/20/2021 0600 Gross per 24 hour  Intake 20 ml  Output 1450 ml  Net -1430 ml   Filed Weights   12/18/21 0322 12/19/21 0500 12/20/21 0500  Weight: 47.9 kg 47.6 kg 47.6 kg    Examination:  General:  In bed on vent HENT: NCAT tracheostomy in place PULM: CTA B, vent supported breathing CV: RRR, no mgr GI: BS+, soft, nontender MSK:  no bulk or tone Neuro: awake, alert, answers questions via eye-movement   6/15 CXR RLL atelectasis is worse today  Resolved Hospital Problem list   Septic shock Hyponatremia Hypokalemia Hypophosphatemia Ileus in setting of electrolyte imbalance  S/P in hospital PEA cardiac arrest due to respiratory failure Atelectasis, right lower lobe collapse  Assessment & Plan:  Acute on chronic hypoxemic and hypercarbic respiratory failure due to progressive neuromuscular disease s/p tracheostomy > failed ATC attempt on 6/14 Progressive ALS on NIV Pneumonia due to pansensitive pseudomonas > improved pneumonia but has persistently positive tracheobronchitis Thick pulmonary secretions, at risk for RLL collapse Unstageable bedsore buttock, POA Full mechanical vent support VAP prevention Daily WUA/SBT Speaking valve trial today Keep neck in position that maintains vent in place Daily azithromycin and nebulized tobi for pseudomonas colonization/bronchitis Hypertonic saline neb to continue Guaifenesin to continue Continue to pursue home vent  Transfer to [place], pulmonary will follow  Best Practice (right click and "Reselect all SmartList Selections" daily)   Diet/type: tubefeeds > change back to home regimen of bolus feeds DVT prophylaxis: LMWH GI prophylaxis: PPI Lines: N/A indwelling port Foley:  N/A Code Status:  full code Last date of multidisciplinary goals of care discussion [6/12: on 6/14 she told me that their plans are to go home with a ventilator]  Critical care time: n/a     Roselie Awkward, MD [place] [facility] Pager: [phone] Cell: [phone] After 7:00 pm call Elink  [phone]

## 2021-12-20 NOTE — Progress Notes (Signed)
This RT assisted SLP with inline PMV. SLP placed inline PMV and RT deflated tracheal cuff with no complications. RT decreased PEEP from 5 to 0 during procedure. Pt tolerated well with SVS. Session ended, RT inflated tracheal cuff and increased PEEP back to 5.

## 2021-12-21 ENCOUNTER — Telehealth: Payer: Self-pay | Admitting: Pulmonary Disease

## 2021-12-21 DIAGNOSIS — A419 Sepsis, unspecified organism: Secondary | ICD-10-CM | POA: Diagnosis not present

## 2021-12-21 DIAGNOSIS — G1221 Amyotrophic lateral sclerosis: Secondary | ICD-10-CM | POA: Diagnosis not present

## 2021-12-21 DIAGNOSIS — J9601 Acute respiratory failure with hypoxia: Secondary | ICD-10-CM | POA: Diagnosis not present

## 2021-12-21 DIAGNOSIS — R6521 Severe sepsis with septic shock: Secondary | ICD-10-CM | POA: Diagnosis not present

## 2021-12-21 DIAGNOSIS — I469 Cardiac arrest, cause unspecified: Secondary | ICD-10-CM | POA: Diagnosis not present

## 2021-12-21 LAB — GLUCOSE, CAPILLARY
Glucose-Capillary: 100 mg/dL — ABNORMAL HIGH (ref 70–99)
Glucose-Capillary: 103 mg/dL — ABNORMAL HIGH (ref 70–99)
Glucose-Capillary: 117 mg/dL — ABNORMAL HIGH (ref 70–99)
Glucose-Capillary: 126 mg/dL — ABNORMAL HIGH (ref 70–99)
Glucose-Capillary: 86 mg/dL (ref 70–99)
Glucose-Capillary: 87 mg/dL (ref 70–99)

## 2021-12-21 MED ORDER — ENOXAPARIN SODIUM 40 MG/0.4ML IJ SOSY
40.0000 mg | PREFILLED_SYRINGE | INTRAMUSCULAR | Status: DC
Start: 2021-12-21 — End: 2022-01-08
  Administered 2021-12-21 – 2022-01-08 (×19): 40 mg via SUBCUTANEOUS
  Filled 2021-12-21 (×19): qty 0.4

## 2021-12-21 NOTE — Progress Notes (Signed)
Early Afternoon Progress Note    12/21/21 1900  PT Visit Information  Last PT Received On 12/21/21  Assistance Needed +2  History of Present Illness 51 yo male admitted 6/7 with progressive respiratory decline requiring urgent intubation. Brief code in ED s/p CPR 2 min. Trach and bronch 6/12. PMhx: ALS (Dx 2020), pre diabetes  Subjective Data  Subjective Later I'd like to get a good ROM and help with moving my arms/legs  Patient Stated Goal return home with understanding of how this will work  Pain Assessment  Pain Assessment Faces  Pain Score 0  General Comments  General comments (skin integrity, edema, etc.) Discussed with pt that I felt that different facets of his care were about to come together with intent to get him out of the hospital soon (by the end of the week)  We discussed education that should be done with he, his wife and therapy to transition how his care/mobility should be safely handled.  PT - End of Session  Patient left in bed;with call bell/phone within reach;with family/visitor present;with nursing/sitter in room  Nurse Communication Mobility status;Need for lift equipment   PT - Assessment/Plan  PT Plan Current plan remains appropriate  PT Visit Diagnosis Other abnormalities of gait and mobility (R26.89)  PT Frequency (ACUTE ONLY) Min 1X/week  Follow Up Recommendations Other (comment) (wife working on [employer] from [employer] to maximize services.)  Assistance recommended at discharge Frequent or constant Supervision/Assistance  Patient can return home with the following Two people to help with walking and/or transfers;Two people to help with bathing/dressing/bathroom;Direct supervision/assist for medications management;Assistance with feeding;Assist for transportation  PT equipment Other (comment) (TBD further, but bed bans, bed pads, maximized aide services, pressure overlay for hospital bed, lift equipment/padding)  AM-PAC PT "6 Clicks" Mobility Outcome  Measure (Version 2)  Help needed turning from your back to your side while in a flat bed without using bedrails? 1  Help needed moving from lying on your back to sitting on the side of a flat bed without using bedrails? 1  Help needed moving to and from a bed to a chair (including a wheelchair)? 1  Help needed standing up from a chair using your arms (e.g., wheelchair or bedside chair)? 1  Help needed to walk in hospital room? 1  Help needed climbing 3-5 steps with a railing?  1  6 Click Score 6  Consider Recommendation of Discharge To: CIR/SNF/LTACH  PT Goal Progression  Progress towards PT goals Progressing toward goals  Acute Rehab PT Goals  PT Goal Formulation With patient/family  Time For Goal Achievement 12/29/21  Potential to Achieve Goals Fair  PT Time Calculation  PT Start Time (ACUTE ONLY) 1530  PT Stop Time (ACUTE ONLY) 1542  PT Time Calculation (min) (ACUTE ONLY) 12 min  PT General Charges  $$ ACUTE PT VISIT 1 Visit  PT Treatments  $Self Care/Home Management 8-22   12/21/2021  Ginger Carne., PT Acute Rehabilitation Services [phone]  (pager) [phone]  (office)

## 2021-12-21 NOTE — Progress Notes (Signed)
This RT assisted SLP with inline PMV. SLP placed inline PMV and RT deflated tracheal cuff with no complications. RT decreased PEEP from 5 to 0 during procedure. Pt tolerated well with SVS. Session ended, RT re-inflated tracheal cuff and increased PEEP back to 5.

## 2021-12-21 NOTE — Progress Notes (Signed)
Later PM progress note  Completed aa/PROM to all 4 extremities with prolonged stretching at bil shd abd/ER and bil hams, heel cords, adductors and large muscle groups around bil hips.  Pt was repositioned to his specs in order for pt to be able to recalibrate to the visual communication aide.    12/21/21 1938  PT Visit Information  Last PT Received On 12/21/21  Assistance Needed +2 (mobility/OOB)  History of Present Illness 51 yo male admitted 6/7 with progressive respiratory decline requiring urgent intubation. Brief code in ED s/p CPR 2 min. Trach and bronch 6/12. PMhx: ALS (Dx 2020), pre diabetes  Subjective Data  Patient Stated Goal return home with understanding of how this will work  Pain Assessment  Pain Assessment Faces  Faces Pain Scale 2  Pain Location muscle with ROM  Pain Intervention(s) Monitored during session  Cognition  Arousal/Alertness Awake/alert  Behavior During Therapy WFL for tasks assessed/performed  Overall Cognitive Status Within Functional Limits for tasks assessed  Bed Mobility  General bed mobility comments Repositioned post AA/PROM for comfort to pt's specs  Exercises  Exercises Other exercises  Other Exercises  Other Exercises PROM to bil LE's with prolonged stretch to large muscle groups around hip and hams, heel cord.  AAROM xt -5 reps in hip/knee flexion /ext with gradeds resistance in gross extension.  Other Exercises PROM to bil UE's shd flex/ext/scaption, elbow flex/ext with mobs at end ranges, forearm pron/sup, wrist flex/ext  PT - End of Session  Patient left in bed;with call bell/phone within reach;with family/visitor present;with nursing/sitter in room  Nurse Communication Mobility status;Need for lift equipment   PT - Assessment/Plan  PT Plan Current plan remains appropriate  PT Visit Diagnosis Other abnormalities of gait and mobility (R26.89)  PT Frequency (ACUTE ONLY) Min 1X/week  Follow Up Recommendations Other (comment)  Assistance  recommended at discharge Frequent or constant Supervision/Assistance  Patient can return home with the following Two people to help with walking and/or transfers;Two people to help with bathing/dressing/bathroom;Direct supervision/assist for medications management;Assistance with feeding;Assist for transportation  PT equipment Other (comment)  AM-PAC PT "6 Clicks" Mobility Outcome Measure (Version 2)  Help needed turning from your back to your side while in a flat bed without using bedrails? 1  Help needed moving from lying on your back to sitting on the side of a flat bed without using bedrails? 1  Help needed moving to and from a bed to a chair (including a wheelchair)? 1  Help needed standing up from a chair using your arms (e.g., wheelchair or bedside chair)? 1  Help needed to walk in hospital room? 1  Help needed climbing 3-5 steps with a railing?  1  6 Click Score 6  Consider Recommendation of Discharge To: CIR/SNF/LTACH  Progressive Mobility  What is the highest level of mobility based on the progressive mobility assessment? Level 1 (Bedfast) - Unable to balance while sitting on edge of bed  PT Goal Progression  Progress towards PT goals Progressing toward goals  Acute Rehab PT Goals  PT Goal Formulation With patient/family  Time For Goal Achievement 12/29/21  Potential to Achieve Goals Fair  PT Time Calculation  PT Start Time (ACUTE ONLY) 1900  PT Stop Time (ACUTE ONLY) 1924  PT Time Calculation (min) (ACUTE ONLY) 24 min  PT General Charges  $$ ACUTE PT VISIT 1 Visit  PT Treatments  $Therapeutic Exercise 23-37 mins    12/21/2021  Ginger Carne., PT Acute Rehabilitation Services [phone]  (pager) [phone]  (  office)

## 2021-12-21 NOTE — Progress Notes (Addendum)
Speech Language Pathology Treatment: Jerry [name] Speaking valve  Patient Details Name: Jerry [name] MRN: [id number] DOB: July 14, 1970 Today's Date: 12/21/2021 Time: 1856-3149 SLP Time Calculation (min) (ACUTE ONLY): 24 min  Assessment / Plan / Recommendation Clinical Impression  Collaborative session with ST and RT (wife not present -pt phoned for part of session) who performed necessary vent adjustments required for inline valve placement. His intelligibility was improved from when this SLP worked with pt mid last week with increased respiratory effort and intensity suspect in part due to Friday's bronch. There were fewer coughing episodes and secretions to orally suction and he was increasing his labial and lingual movements. Able to decode words and phrases with majority of context unknown (approximately 40-50% word level-phrase) unlike last week and used his AAC device less during session. Vital signs stable. Verbal communication/intelligibility is still limited due to significant dysarthria caused by bulbar changes with disease process.    HPI HPI: Jerry [name] is a 51 yo male with hx significant for limb-onset ALS with progressive quadriparesis, anarthria and dysphagia (dx 2020). Admitted 6/7 with progressive respiratory decline, requiring urgent intubation. Brief code in ED s/p CPR 2 min. Trach and bronch 6/12. On ventilator. PEG 10/26/21 due to progressive dysphagia.  Pt participates in the ALS clinic at [facility].      SLP Plan  Continue with current plan of care      Recommendations for follow up therapy are one component of a multi-disciplinary discharge planning process, led by the attending physician.  Recommendations may be updated based on patient status, additional functional criteria and insurance authorization.    Recommendations         Patient may use Passy-Muir Speech Valve:  (can wear with RT and RT without speech) PMSV Supervision: Full         Oral Care  Recommendations: Oral care QID Follow Up Recommendations:  (TBD) Assistance recommended at discharge: Frequent or constant Supervision/Assistance SLP Visit Diagnosis: Aphonia (R49.1) Plan: Continue with current plan of care           Jerry [name]  12/21/2021, 3:15 PM

## 2021-12-21 NOTE — Progress Notes (Addendum)
PROGRESS NOTE    Jerry [name]  LYY:[id number] DOB: 11/20/1970 DOA: 12/09/2021 PCP: [name], MD    Chief Complaint  Patient presents with   Altered Mental Status    Brief Narrative:   51 y/o male with ALS admitted on 6/7 in the setting of acute hypoxemic respiratory failure requiring intubation and mechanical ventilation.  He had a brief cardiac arrest in the ER around the time of intubation. He had another cardiac arrest in the ICU on the day of admission.     Significant events 12/09/2021 intubated. Reportedly brief code in the ED with rapid response RN. Arrived in the ICU. Became hypotensive despite vasopressor support. Lost pulse again and CPR performed with ROSC. Bronchoscopy with erythematous airways with scant thin secretions 6/12 s/p trach 6/14 failed ATC after 10 minutes 6/16 bronchoscopy> thick mucus removed from RLL; culture sent> pseudomonas 6/17 started inhaled tobi, daily azithro   Assessment & Plan:   Principal Problem:   Septic shock ([place]) Active Problems:   ALS (amyotrophic lateral sclerosis) ([place])   Cardiac arrest ([place])   Acute hypoxemic respiratory failure (HCC)  Acute on chronic hypoxemic and hypercarbic respiratory failure due to progressive  -Initially on Trelegy at home, with worsening respiratory failure, this is secondary to ALS, and pneumonia/tracheal bronchiolitis  -currently vent dependent -Vent management per PCCM. -Currently trached, likely will DC home on full vent support -We will consult case manager   ALS  -Patient bedbound over the last year and a half, he is currently back dependent, and now vent dependent -He is following with ALS clinic at [facility] .   Pneumonia due to pansensitive pseudomonas > improved pneumonia but has persistently positive tracheobronchitis Thick pulmonary secretions, at risk for RLL collapse - VAP prevention - Daily WUA/SBT - Keep neck in position that maintains vent in place - Daily azithromycin and  nebulized tobi for pseudomonas colonization/bronchitis - Hypertonic saline neb to continue - Guaifenesin to continue  Septic shock -Due to above, resolved  Cardiac arrest -Due to above   Unstageable bedsore buttock, POA   DVT prophylaxis: Lovenox Code Status: Full Family Communication: D/W wife at bedside Disposition:   Status is: Inpatient    Consultants:  PCCM >> [facility] 12/21/2021  Subjective:  No significant events as discussed with staff overnight, wife at bedside, denies any new events or any complaints.  Objective: Vitals:   12/21/21 0800 12/21/21 0900 12/21/21 1000 12/21/21 1055  BP: 109/76 103/65 101/65 101/65  Pulse: 77 94 100 95  Resp: '16 20 14 20  '$ Temp:      TempSrc:      SpO2: 100% 99% 98% 100%  Weight:        Intake/Output Summary (Last 24 hours) at 12/21/2021 1057 Last data filed at 12/21/2021 0800 Gross per 24 hour  Intake 750 ml  Output 650 ml  Net 100 ml   Filed Weights   12/18/21 0322 12/19/21 0500 12/20/21 0500  Weight: 47.9 kg 47.6 kg 47.6 kg    Examination:  Awake trached, vented, open eyes intermittently Symmetrical Chest wall movement, vented respiratory sounds RRR,No Gallops,Rubs or new Murmurs, No Parasternal Heave +ve B.Sounds, Abd Soft, PEG present No Cyanosis, Clubbing or edema, significant muscle wasting     Data Reviewed: I have personally reviewed following labs and imaging studies  CBC: Recent Labs  Lab 12/17/21 0339 12/18/21 0440 12/19/21 0512 12/20/21 0008  WBC 4.5 6.3 5.7 5.6  NEUTROABS  --  4.9 3.8  --   HGB 9.1* 8.5* 8.9*  8.9*  HCT 27.0* 25.6* 26.5* 26.8*  MCV 89.7 91.1 92.0 92.7  PLT 169 183 213 196    Basic Metabolic Panel: Recent Labs  Lab 12/17/21 0339 12/18/21 0440 12/19/21 0512 12/20/21 0008  NA 133* 139 135 137  K 3.6 3.8 3.8 4.5  CL 103 108 106 111  CO2 21* '24 24 23  '$ GLUCOSE 90 90 90 148*  BUN '9 8 8 12  '$ CREATININE <0.30* <0.30* <0.30* <0.30*  CALCIUM 8.2* 8.4* 8.3* 8.0*  MG 2.3  --    --   --   PHOS 3.4  --   --   --     GFR: CrCl cannot be calculated (This lab value cannot be used to calculate CrCl because it is not a number: <0.30).  Liver Function Tests: Recent Labs  Lab 12/18/21 0440 12/19/21 0512  AST 15 14*  ALT 29 27  ALKPHOS 47 54  BILITOT 0.8 0.4  PROT 4.9* 5.4*  ALBUMIN 2.4* 2.5*    CBG: Recent Labs  Lab 12/20/21 1516 12/20/21 1921 12/20/21 2336 12/21/21 0345 12/21/21 0747  GLUCAP 84 83 130* 87 86     Recent Results (from the past 240 hour(s))  Culture, Respiratory w Gram Stain     Status: None   Collection Time: 12/18/21 11:19 AM   Specimen: Tracheal Aspirate; Respiratory  Result Value Ref Range Status   Specimen Description TRACHEAL ASPIRATE  Final   Special Requests NONE  Final   Gram Stain   Final    RARE WBC PRESENT,BOTH PMN AND MONONUCLEAR NO ORGANISMS SEEN Performed at [facility], [address], [place], [place] [address]    Culture FEW PSEUDOMONAS AERUGINOSA  Final   Report Status 12/20/2021 FINAL  Final   Organism ID, Bacteria PSEUDOMONAS AERUGINOSA  Final      Susceptibility   Pseudomonas aeruginosa - MIC*    CEFTAZIDIME >=64 RESISTANT Resistant     CIPROFLOXACIN <=0.25 SENSITIVE Sensitive     GENTAMICIN <=1 SENSITIVE Sensitive     IMIPENEM 1 SENSITIVE Sensitive     * FEW PSEUDOMONAS AERUGINOSA         Radiology Studies: No results found.      Scheduled Meds:  azithromycin  250 mg Per Tube Daily   Chlorhexidine Gluconate Cloth  6 each Topical Daily   enoxaparin (LOVENOX) injection  40 mg Subcutaneous Q24H   feeding supplement (KATE FARMS STANDARD 1.4)  325 mL Per Tube QID   free water  100 mL Per Tube QID   guaiFENesin  15 mL Per Tube Q6H   heparin lock flush  500 Units Intracatheter Q30 days   melatonin  5 mg Per Tube QHS   mouth rinse  15 mL Mouth Rinse Q2H   pantoprazole sodium  40 mg Per Tube Daily   polyethylene glycol  17 g Per Tube Daily   scopolamine  1 patch Transdermal Q72H    sennosides  10 mL Per Tube QHS   sodium chloride flush  10-40 mL Intracatheter Q12H   sodium chloride HYPERTONIC  4 mL Nebulization BID   tobramycin (PF)  300 mg Nebulization BID   Continuous Infusions:  sodium chloride       LOS: 12 days      Phillips Climes, MD [facility]   To contact the attending provider between 7A-7P or the covering provider during after hours 7P-7A, please log into the web site [email or web address] and access using universal [place] password for that web site.  If you do not have the password, please call the hospital operator.  12/21/2021, 10:57 AM

## 2021-12-21 NOTE — Progress Notes (Deleted)
Physical Therapy Treatment Patient Details Name: Jerry [name] MRN: [id number] DOB: 03/13/1971 Today's Date: 12/21/2021   History of Present Illness 51 yo male admitted 6/7 with progressive respiratory decline requiring urgent intubation. Brief code in ED s/p CPR 2 min. Trach and bronch 6/12. PMhx: ALS (Dx 2020), pre diabetes    PT Comments    Completed aa/PROM to all 4 extremities with prolonged stretching at bil shd abd/ER and bil hams, heel cords, adductors and large muscle groups around bil hips.  Pt was repositioned to his specs in order for pt to be able to recalibrate to the visual communication aide.    Recommendations for follow up therapy are one component of a multi-disciplinary discharge planning process, led by the attending physician.  Recommendations may be updated based on patient status, additional functional criteria and insurance authorization.  Follow Up Recommendations  Other (comment)     Assistance Recommended at Discharge Frequent or constant Supervision/Assistance  Patient can return home with the following Two people to help with walking and/or transfers;Two people to help with bathing/dressing/bathroom;Direct supervision/assist for medications management;Assistance with feeding;Assist for transportation   Equipment Recommendations  Other (comment)    Recommendations for Other Services       Precautions / Restrictions       Mobility  Bed Mobility               General bed mobility comments: Repositioned post AA/PROM for comfort to pt's specs    Transfers                        Ambulation/Gait                   Stairs             Wheelchair Mobility    Modified [place] (Stroke Patients Only)       Balance                                            Cognition Arousal/Alertness: Awake/alert Behavior During Therapy: WFL for tasks assessed/performed Overall Cognitive Status: Within Functional  Limits for tasks assessed                                          Exercises Other Exercises Other Exercises: PROM to bil LE's with prolonged stretch to large muscle groups around hip and hams, heel cord.  AAROM xt -5 reps in hip/knee flexion /ext with gradeds resistance in gross extension. Other Exercises: PROM to bil UE's shd flex/ext/scaption, elbow flex/ext with mobs at end ranges, forearm pron/sup, wrist flex/ext    General Comments General comments (skin integrity, edema, etc.): Discussed with pt that I felt that different facets of his care were about to come together with intent to get him out of the hospital soon (by the end of the week)  We discussed education that should be done with he, his wife and therapy to transition how his care/mobility should be safely handled.      Pertinent Vitals/Pain Pain Assessment Pain Assessment: Faces Pain Score: 0-No pain Faces Pain Scale: Hurts a little bit Pain Location: muscle with ROM Pain Intervention(s): Monitored during session    Home Living  Prior Function            PT Goals (current goals can now be found in the care plan section) Acute Rehab PT Goals Patient Stated Goal: return home with understanding of how this will work PT Goal Formulation: With patient/family Time For Goal Achievement: 12/29/21 Potential to Achieve Goals: Fair Progress towards PT goals: Progressing toward goals    Frequency    Min 1X/week      PT Plan Current plan remains appropriate    Co-evaluation              AM-PAC PT "6 Clicks" Mobility   Outcome Measure  Help needed turning from your back to your side while in a flat bed without using bedrails?: Total Help needed moving from lying on your back to sitting on the side of a flat bed without using bedrails?: Total Help needed moving to and from a bed to a chair (including a wheelchair)?: Total Help needed standing up from a  chair using your arms (e.g., wheelchair or bedside chair)?: Total Help needed to walk in hospital room?: Total Help needed climbing 3-5 steps with a railing? : Total 6 Click Score: 6    End of Session     Patient left: in bed;with call bell/phone within reach;with family/visitor present;with nursing/sitter in room Nurse Communication: Mobility status;Need for lift equipment PT Visit Diagnosis: Other abnormalities of gait and mobility (R26.89)     Time: 7824-2353 PT Time Calculation (min) (ACUTE ONLY): 24 min  Charges:  $Therapeutic Exercise: 23-37 mins $Self Care/Home Management: 8-22                     12/21/2021  Jerry Carne., PT Acute Rehabilitation Services [phone]  (pager) [phone]  (office)   Jerry [name] 12/21/2021, 7:45 PM

## 2021-12-21 NOTE — Telephone Encounter (Signed)
Patient is scheduled for HFU on 02/01/2022 at 2pm with Dr. [name]- appt reminder mailed, nothing further needed.

## 2021-12-21 NOTE — Progress Notes (Signed)
NAME:  Jerry [name], MRN:  [id number], DOB:  1970/08/22, LOS: 12 ADMISSION DATE:  12/09/2021, CONSULTATION DATE:  6/7 REFERRING MD: Ralene Bathe EDP , CHIEF COMPLAINT:  Dyspnea   History of Present Illness:  51 y/o male with ALS admitted on 6/7 in the setting of acute hypoxemic respiratory failure requiring intubation and mechanical ventilation.  He had a brief cardiac arrest in the ER around the time of intubation. He had another cardiac arrest in the ICU on the day of admission.    Pertinent  Medical History  ALS COVID-19 Morbid obesity Prediabetes  Significant Hospital Events: Including procedures, antibiotic start and stop dates in addition to other pertinent events   12/09/2021 intubated. Reportedly brief code in the ED with rapid response RN. Arrived in the ICU. Became hypotensive despite vasopressor support. Lost pulse again and CPR performed with ROSC. Bronchoscopy with erythematous airways with scant thin secretions 6/12 s/p trach 6/14 failed ATC after 10 minutes 6/16 bronchoscopy> thick mucus removed from RLL; culture sent> pseudomonas 6/17 started inhaled tobi, daily azithro  Interim History / Subjective:   Feels about the same this afternoon  Objective   Blood pressure 104/67, pulse 87, temperature 98.6 F (37 C), temperature source Oral, resp. rate 18, weight 47.6 kg, SpO2 100 %.    Vent Mode: PRVC FiO2 (%):  [30 %] 30 % Set Rate:  [20 bmp] 20 bmp Vt Set:  [490 mL] 490 mL PEEP:  [5 cmH20] 5 cmH20 Plateau Pressure:  [16 cmH20-19 cmH20] 17 cmH20   Intake/Output Summary (Last 24 hours) at 12/21/2021 1513 Last data filed at 12/21/2021 0800 Gross per 24 hour  Intake 750 ml  Output 650 ml  Net 100 ml   Filed Weights   12/18/21 0322 12/19/21 0500 12/20/21 0500  Weight: 47.9 kg 47.6 kg 47.6 kg    Examination:  General:  In bed on vent HENT: NCAT tracheostomy in place PULM: CTA B, vent supported breathing CV: RRR, no mgr GI: BS+, soft, nontender MSK: no bulk and  tone Neuro: sedated on vent   6/15 CXR RLL atelectasis is worse today  Resolved Hospital Problem list   Septic shock Hyponatremia Hypokalemia Hypophosphatemia Ileus in setting of electrolyte imbalance  S/P in hospital PEA cardiac arrest due to respiratory failure Atelectasis, right lower lobe collapse  Assessment & Plan:  Acute on chronic hypoxemic and hypercarbic respiratory failure due to progressive neuromuscular disease s/p tracheostomy > failed ATC attempt on 6/14 Progressive ALS on NIV Pneumonia due to pansensitive pseudomonas > improved pneumonia but has persistently positive tracheobronchitis Thick pulmonary secretions, at risk for RLL collapse Unstageable bedsore buttock, POA Full mechanical vent support VAP prevention Daily WUA/SBT Continue daily speaking valve use Keep neck in a position to keep from having his head fall on the tracheostomy tube Daily azithromycin and nebulzied tobi for pseudomonas colonization/bronchitis> trying to keep  Hypertonic saline neb to continue  Guafenesin to continue Continue to pursue home vent> I'm OK with Trilogy ventilator after discussing with his family.   Best Practice (right click and "Reselect all SmartList Selections" daily)   Diet/type: tubefeeds > change back to home regimen of bolus feeds DVT prophylaxis: LMWH GI prophylaxis: PPI Lines: N/A indwelling port Foley:  N/A Code Status:  full code Last date of multidisciplinary goals of care discussion [6/12: on 6/14 she told me that their plans are to go home with a ventilator]  Critical care time: n/a     Roselie Awkward, MD [place] PCCM Pager: [phone] Cell: [phone]  After 7:00 pm call Elink  [phone]

## 2021-12-21 NOTE — Telephone Encounter (Signed)
Please arrange hospital follow up with [facility] or an NP in 4-6 weeks.  Reason: chronic respiratory failure with hypoxemia, on home ventilator.

## 2021-12-21 NOTE — TOC Progression Note (Signed)
Transition of Care [facility]) - Progression Note    Patient Details  Name: Camillo Quadros MRN: [id number] Date of Birth: [birth year]  Transition of Care [facility]) CM/SW Contact  Tom-Johnson, Renea Ee, RN Phone Number: 12/21/2021, 4:20 PM  Clinical Narrative:     CM received an update call from [facility] with [employer] about completing home assessment and needing education with wife. CM contacted wife, Fredda Hammed and she notified CM that she has to decide between [employer] [facility] and [employer] as she needs to know what services each company is offering and then chose the one that is suitable for patient. CM made [name] aware that this is the first time of her mentioning [employer] [facility] and that was not mentioned in initial conversation.   CM requested MD to call and speak with [facility] and answer questions she has. MD did. Rob with [employer] notified CM that Insurance has approved authorization. CM will continue to follow with needs.    Expected Discharge Plan: [place] Barriers to Discharge: Continued Medical Work up  Expected Discharge Plan and Services Expected Discharge Plan: [place]   Discharge Planning Services: CM Consult Post Acute Care Choice: Home Health, Durable Medical Equipment Living arrangements for the past 2 months: Single Family Home                 DME Arranged: Oxygen (Trilogy) DME Agency: [employer] Date DME Agency Contacted: 12/15/21 Time DME Agency Contacted: [phone] Representative spoke with at DME Agency: [place] ([place]) Interventions    Readmission Risk Interventions     No data to display

## 2021-12-22 ENCOUNTER — Ambulatory Visit: Payer: Self-pay

## 2021-12-22 DIAGNOSIS — G1221 Amyotrophic lateral sclerosis: Secondary | ICD-10-CM

## 2021-12-22 DIAGNOSIS — R6521 Severe sepsis with septic shock: Secondary | ICD-10-CM | POA: Diagnosis not present

## 2021-12-22 DIAGNOSIS — A419 Sepsis, unspecified organism: Secondary | ICD-10-CM | POA: Diagnosis not present

## 2021-12-22 DIAGNOSIS — J9601 Acute respiratory failure with hypoxia: Secondary | ICD-10-CM | POA: Diagnosis not present

## 2021-12-22 DIAGNOSIS — Z9911 Dependence on respirator [ventilator] status: Secondary | ICD-10-CM | POA: Diagnosis not present

## 2021-12-22 LAB — GLUCOSE, CAPILLARY
Glucose-Capillary: 95 mg/dL (ref 70–99)
Glucose-Capillary: 84 mg/dL (ref 70–99)
Glucose-Capillary: 96 mg/dL (ref 70–99)
Glucose-Capillary: 66 mg/dL — ABNORMAL LOW (ref 70–99)
Glucose-Capillary: 91 mg/dL (ref 70–99)
Glucose-Capillary: 134 mg/dL — ABNORMAL HIGH (ref 70–99)

## 2021-12-22 LAB — CBC
HCT: 26.2 % — ABNORMAL LOW (ref 39.0–52.0)
Hemoglobin: 8.8 g/dL — ABNORMAL LOW (ref 13.0–17.0)
MCH: 30.9 pg (ref 26.0–34.0)
MCHC: 33.6 g/dL (ref 30.0–36.0)
MCV: 91.9 fL (ref 80.0–100.0)
Platelets: 248 10*3/uL (ref 150–400)
RBC: 2.85 MIL/uL — ABNORMAL LOW (ref 4.22–5.81)
RDW: 14.9 % (ref 11.5–15.5)
WBC: 4.4 10*3/uL (ref 4.0–10.5)
nRBC: 0 % (ref 0.0–0.2)

## 2021-12-22 LAB — BASIC METABOLIC PANEL
Anion gap: 5 (ref 5–15)
BUN: 9 mg/dL (ref 6–20)
CO2: 24 mmol/L (ref 22–32)
Calcium: 8.6 mg/dL — ABNORMAL LOW (ref 8.9–10.3)
Chloride: 109 mmol/L (ref 98–111)
Creatinine, Ser: <0.3 mg/dL — ABNORMAL LOW (ref 0.61–1.24)
Glucose, Bld: 91 mg/dL (ref 70–99)
Potassium: 3.8 mmol/L (ref 3.5–5.1)
Sodium: 138 mmol/L (ref 135–145)

## 2021-12-22 MED ORDER — DEXTROSE 50 % IV SOLN
INTRAVENOUS | Status: AC
  Administered 2021-12-22: 25 mL
  Filled 2021-12-22: qty 50

## 2021-12-22 NOTE — Progress Notes (Signed)
PROGRESS NOTE    Jerry [name]  QPY:[id number] DOB: March 15, 1971 DOA: 12/09/2021 PCP: [name], MD    Chief Complaint  Patient presents with   Altered Mental Status    Brief Narrative:   51 y/o male with ALS admitted on 6/7 in the setting of acute hypoxemic respiratory failure requiring intubation and mechanical ventilation.  He had a brief cardiac arrest in the ER around the time of intubation. He had another cardiac arrest in the ICU on the day of admission.     Significant events 12/09/2021 intubated. Reportedly brief code in the ED with rapid response RN. Arrived in the ICU. Became hypotensive despite vasopressor support. Lost pulse again and CPR performed with ROSC. Bronchoscopy with erythematous airways with scant thin secretions 6/12 s/p trach 6/14 failed ATC after 10 minutes 6/16 bronchoscopy> thick mucus removed from RLL; culture sent> pseudomonas 6/17 started inhaled tobi, daily azithro   Assessment & Plan:   Principal Problem:   Septic shock ([place]) Active Problems:   ALS (amyotrophic lateral sclerosis) ([place])   Cardiac arrest ([place])   Acute hypoxemic respiratory failure (HCC)  Acute on chronic hypoxemic and hypercarbic respiratory failure due to progressive  -Initially on Trelegy at home, with worsening respiratory failure, this is secondary to ALS, and pneumonia/tracheal bronchiolitis  -currently vent dependent -Vent management per PCCM. -Currently trached, likely will DC home on full vent support -Wife requesting palliative care consult to discuss goals of care   ALS  -Patient bedbound over the last year and a half, he is currently back dependent, and now vent dependent -He is following with ALS clinic at [facility] .   Pneumonia due to pansensitive pseudomonas > improved pneumonia but has persistently positive tracheobronchitis Thick pulmonary secretions, at risk for RLL collapse - VAP prevention - Daily WUA/SBT - Keep neck in position that maintains vent in  place - Daily azithromycin and nebulized tobi for pseudomonas colonization/bronchitis - Hypertonic saline neb to continue - Guaifenesin to continue  Septic shock -Due to above, resolved  Cardiac arrest -Due to above   Unstageable bedsore buttock, POA   DVT prophylaxis: Lovenox Code Status: Full Family Communication: D/W family friend at bedside Disposition:   Status is: Inpatient    Consultants:  PCCM >> [facility] 12/21/2021 Palliative  Subjective:  No significant events as discussed with staff overnight, wife at bedside, denies any new events or any complaints.  Objective: Vitals:   12/22/21 1000 12/22/21 1100 12/22/21 1156 12/22/21 1218  BP:  97/67 97/67   Pulse: 85 87 94   Resp: '20 20 20   '$ Temp:    98.7 F (37.1 C)  TempSrc:    Oral  SpO2: 100% 100% 100%   Weight:        Intake/Output Summary (Last 24 hours) at 12/22/2021 1343 Last data filed at 12/22/2021 0800 Gross per 24 hour  Intake 900 ml  Output 1700 ml  Net -800 ml   Filed Weights   12/18/21 0322 12/19/21 0500 12/20/21 0500  Weight: 47.9 kg 47.6 kg 47.6 kg    Examination:  Wakes up, open eyes, likely ill-appearing  Symmetrical Chest wall movement, vented respiratory sounds bilaterally RRR,No Gallops,Rubs or new Murmurs, No Parasternal Heave +ve B.Sounds, Abd Soft,  No Cyanosis, Clubbing or edema     Data Reviewed: I have personally reviewed following labs and imaging studies  CBC: Recent Labs  Lab 12/17/21 0339 12/18/21 0440 12/19/21 0512 12/20/21 0008 12/22/21 0235  WBC 4.5 6.3 5.7 5.6 4.4  NEUTROABS  --  4.9 3.8  --   --   HGB 9.1* 8.5* 8.9* 8.9* 8.8*  HCT 27.0* 25.6* 26.5* 26.8* 26.2*  MCV 89.7 91.1 92.0 92.7 91.9  PLT 169 183 213 199 784    Basic Metabolic Panel: Recent Labs  Lab 12/17/21 0339 12/18/21 0440 12/19/21 0512 12/20/21 0008 12/22/21 0235  NA 133* 139 135 137 138  K 3.6 3.8 3.8 4.5 3.8  CL 103 108 106 111 109  CO2 21* '24 24 23 24  '$ GLUCOSE 90 90 90 148* 91   BUN '9 8 8 12 9  '$ CREATININE <0.30* <0.30* <0.30* <0.30* <0.30*  CALCIUM 8.2* 8.4* 8.3* 8.0* 8.6*  MG 2.3  --   --   --   --   PHOS 3.4  --   --   --   --     GFR: CrCl cannot be calculated (This lab value cannot be used to calculate CrCl because it is not a number: <0.30).  Liver Function Tests: Recent Labs  Lab 12/18/21 0440 12/19/21 0512  AST 15 14*  ALT 29 27  ALKPHOS 47 54  BILITOT 0.8 0.4  PROT 4.9* 5.4*  ALBUMIN 2.4* 2.5*    CBG: Recent Labs  Lab 12/21/21 2003 12/21/21 2331 12/22/21 0348 12/22/21 0753 12/22/21 1217  GLUCAP 117* 100* 95 84 96     Recent Results (from the past 240 hour(s))  Culture, Respiratory w Gram Stain     Status: None   Collection Time: 12/18/21 11:19 AM   Specimen: Tracheal Aspirate; Respiratory  Result Value Ref Range Status   Specimen Description TRACHEAL ASPIRATE  Final   Special Requests NONE  Final   Gram Stain   Final    RARE WBC PRESENT,BOTH PMN AND MONONUCLEAR NO ORGANISMS SEEN Performed at [facility], [address], [place], [place] [address]    Culture FEW PSEUDOMONAS AERUGINOSA  Final   Report Status 12/20/2021 FINAL  Final   Organism ID, Bacteria PSEUDOMONAS AERUGINOSA  Final      Susceptibility   Pseudomonas aeruginosa - MIC*    CEFTAZIDIME >=64 RESISTANT Resistant     CIPROFLOXACIN <=0.25 SENSITIVE Sensitive     GENTAMICIN <=1 SENSITIVE Sensitive     IMIPENEM 1 SENSITIVE Sensitive     * FEW PSEUDOMONAS AERUGINOSA         Radiology Studies: No results found.      Scheduled Meds:  azithromycin  250 mg Per Tube Daily   Chlorhexidine Gluconate Cloth  6 each Topical Daily   enoxaparin (LOVENOX) injection  40 mg Subcutaneous Q24H   feeding supplement (KATE FARMS STANDARD 1.4)  325 mL Per Tube QID   free water  100 mL Per Tube QID   guaiFENesin  15 mL Per Tube Q6H   heparin lock flush  500 Units Intracatheter Q30 days   melatonin  5 mg Per Tube QHS   mouth rinse  15 mL Mouth Rinse Q2H    pantoprazole sodium  40 mg Per Tube Daily   polyethylene glycol  17 g Per Tube Daily   scopolamine  1 patch Transdermal Q72H   sennosides  10 mL Per Tube QHS   sodium chloride flush  10-40 mL Intracatheter Q12H   sodium chloride HYPERTONIC  4 mL Nebulization BID   tobramycin (PF)  300 mg Nebulization BID   Continuous Infusions:  sodium chloride       LOS: 13 days      Phillips Climes, MD [facility]  To contact the attending provider between 7A-7P or the covering provider during after hours 7P-7A, please log into the web site [email or web address] and access using universal [place] password for that web site. If you do not have the password, please call the hospital operator.  12/22/2021, 1:43 PM

## 2021-12-22 NOTE — Chronic Care Management (AMB) (Signed)
Care Management    RN Visit Note   Name: Scotland Korver MRN: [id number] DOB: 1971/02/15  Subjective: Cantrell Larouche is a 51 y.o. year old male who is a primary care patient of [name], MD. The care management team was consulted for assistance with disease management and care coordination needs.    Engaged with patient's spouse by telephone for follow up visit in response to provider referral for case management and care coordination services.   Consent to Services:   Mr. Frasier was given information about Care Management services including:  Care Management services includes personalized support from designated clinical staff supervised by his physician, including individualized plan of care and coordination with other care providers 24/7 contact phone numbers for assistance for urgent and routine care needs. The patient may stop case management services at any time by phone call to the office staff.  Patient agreed to services and consent obtained.   Assessment: Review of patient past medical history, allergies, medications, health status, including review of consultants reports, laboratory and other test data, was performed as part of comprehensive evaluation and provision of chronic care management services.   SDOH (Social Determinants of Health) assessments and interventions performed: No  Care Plan  Allergies  Allergen Reactions   Crab [Shellfish Allergy] Rash    No facility-administered encounter medications on file as of 11/10/2021.   Outpatient Encounter Medications as of 11/10/2021  Medication Sig Note   EPINEPHrine (EPIPEN 2-PAK) 0.3 mg/0.3 mL IJ SOAJ injection Inject 0.3 mLs (0.3 mg total) into the muscle as needed for anaphylaxis.    [DISCONTINUED] Black Elderberry (SAMBUCUS ELDERBERRY) 50 MG/5ML SYRP Take by mouth. (Patient not taking: Reported on 11/10/2021) 11/10/2021: Pending Provider Review   [DISCONTINUED] Magnesium 200 MG CHEW Chew by mouth. (Patient not taking:  Reported on 11/10/2021) 11/10/2021: Pending Provider Review   [DISCONTINUED] Potassium 99 MG TABS Take by mouth. (Patient not taking: Reported on 11/10/2021) 11/10/2021: Pending Provider Review   [DISCONTINUED] VITAMIN D-VITAMIN K PO Take by mouth. Liquid drop (Patient not taking: Reported on 11/10/2021) 11/10/2021: Pending Provider Review    Patient Active Problem List   Diagnosis Date Noted   Cardiac arrest [facility])    Acute hypoxemic respiratory failure ([place])    Septic shock ([place]) 12/09/2021   Anxiety 11/18/2021   Bed sore on buttock, right, unstageable ([place]) 11/18/2021   Severe protein-calorie malnutrition ([place]) 11/13/2021   Microscopic hematuria 10/29/2021   S/P percutaneous endoscopic gastrostomy (PEG) tube placement ([place]) 10/29/2021   ALS (amyotrophic lateral sclerosis) ([place]) 01/11/2019    Call received from patient's caregiver/spouse. Thorough discussion regarding patient's current functional status. Reports significant decline in patient's functional status since he was last engaged with the Care Management team. Reports patient requires constant care. He is currently unable to perform ADLs and IADLs independently. She continues to serve as his primary caregiver. Denies concerns regarding medications or prescription cost. Reports he currently has a feeding tube. She is interested in options for receiving formulas that are available in bulk with a provider's order. She will update the team with preferred formula. Will forward to PCP once availability via bulk ordering is confirmed.  Discussed Goals of Care. Reports he is currently receiving Home Health PT/OT and nursing services. Also receiving 80 hours of PCS services a month. Reports he was previously receiving hospice services however the services were discontinued due to the family opting for feeding tube placement. Patient and family prefer that he remains in the home with needed services versus facility  placement. Reports this has been feasible  until this point but notes he may require additional hours if his functional status declines. Agreed to follow up with the team regarding specific needs. The team will gladly assist.   PLAN:  Patient's spouse will contact the care management team as needed.  Cristy Friedlander [facility] Care Management [facility] [phone]

## 2021-12-22 NOTE — Progress Notes (Signed)
Nutrition Follow-up  DOCUMENTATION CODES:   Underweight  INTERVENTION:   Continue bolus tube feeding regimen via PEG: - 1 carton (325 ml) of [employer] Standard 1.4 formula 5 times daily at 0600, 1000, 1400, 1800, and 2200 - Flush PEG with 50 ml free water before and 50 ml free water after each bolus feed  Increased tube feeding regimen provides 2275 kcal, 100 grams of protein, and 1155 ml of H2O (meets >100% of estimated kcal and protein needs).  Total free water with flushes: 1655 ml  NUTRITION DIAGNOSIS:   Inadequate oral intake related to inability to eat as evidenced by NPO status.  Ongoing, being addressed via TF  GOAL:   Patient will meet greater than or equal to 90% of their needs  Met via TF  MONITOR:   Vent status, Labs, Weight trends, TF tolerance  REASON FOR ASSESSMENT:   Ventilator, Consult Enteral/tube feeding initiation and management  ASSESSMENT:   51 year old male who presented to the ED on 6/07 with AMS. PMH of ALS which has progressed since last year on home NIV, wheelchair-bound at baseline. Pt admitted with septic shock due to bilateral multifocal PNA, ileus.  06/07 - Code Blue, s/p bronchoscopy 06/08 - tube feeds initiated via PEG 06/12 - s/p tracheostomy  Discussed pt with RN and during ICU rounds. Also discussed pt with CCM NP. Pt reporting that he does not think he is getting enough nutrition. He also states that he is weak and hungry. Discussed with pt at bedside (utilizing computer to communicate) who is in agreement with plan to add an extra bolus of tube feeding to see if that helps. Pt is under the impression that he only received 2 tube feeding boluses yesterday. Reviewed MAR and all boluses were administered. Suspect pt may have been sleeping during tube feeding administration and may not remember.  Discussed the above with pt's wife via phone call. Will adjust bolus feeding schedule so that pt receives a bolus q 4 hours during the  daytime, starting at 6 AM and ending at 10 PM. Pt's wife in agreement with plan to increase regimen from 4 cartons to 5 cartons.  Admit weight: 42.6 kg Current weight: 47.6 kg  Patient remains on ventilator support via trach MV: 8.2 L/min Temp (24hrs), Avg:98.6 F (37 C), Min:98.1 F (36.7 C), Max:99.1 F (37.3 C)  Medications reviewed and include: melatonin, protonix, miralax, scopolamine patch, senokot  Labs reviewed: hemoglobin 8.8 CBG's: 66-134 x 24 hours  UOP: 1100 ml x 24 hours I/O's: +9.2 L since admit  Diet Order:   Diet Order             Diet NPO time specified  Diet effective now                   EDUCATION NEEDS:   No education needs have been identified at this time  Skin:  Skin Assessment: Reviewed RN Assessment (abrasion to bilateral buttocks)  Last BM:  12/21/21 medium type 3  Height:   Ht Readings from Last 1 Encounters:  11/18/21 '5\' 5"'  (1.651 m)    Weight:   Wt Readings from Last 1 Encounters:  12/20/21 47.6 kg    BMI:  Body mass index is 17.46 kg/m.  Estimated Nutritional Needs:   Kcal:  1700-1900  Protein:  80-95 grams  Fluid:  1.7-1.9 L    Gustavus Bryant, MS, RD, LDN Inpatient Clinical Dietitian Please see AMiON for contact information.

## 2021-12-22 NOTE — TOC Progression Note (Signed)
Transition of Care [facility]) - Initial/Assessment Note    Patient Details  Name: Jerry [name] MRN: [id number] Date of Birth: Nov 22, 1970  Transition of Care [facility]) CM/SW Contact:    Milinda Antis, [place] Phone Number: 12/22/2021, 3:51 PM  Clinical Narrative:                 CSW received consult for LTACH placement and contacted the patient's spouse to inquire about insurance.  CSW was informed that the patient may have [facility] Medicare.  The local LTACH's will not take Medicaid.  CSW is waiting for a response from the patient's spouse.    TOC will continue to follow.   Expected Discharge Plan: [place] Barriers to Discharge: Continued Medical Work up   Patient Goals and CMS Choice Patient states their goals for this hospitalization and ongoing recovery are:: To return home CMS Medicare.gov Compare Post Acute Care list provided to:: Patient Represenative (must comment) (Wife, [name]) Choice offered to / list presented to : Patient, Spouse  Expected Discharge Plan and Services Expected Discharge Plan: [place]   Discharge Planning Services: CM Consult Post Acute Care Choice: Home Health, Durable Medical Equipment Living arrangements for the past 2 months: Single Family Home                 DME Arranged: Oxygen ([employer]) DME Agency: [employer] Date DME Agency Contacted: 12/15/21 Time DME Agency Contacted: [phone] Representative spoke with at DME Agency: Thedore Mins            Prior Living Arrangements/Services Living arrangements for the past 2 months: [place] Lives with:: Adult Children, Spouse Patient language and need for interpreter reviewed:: Yes Do you feel safe going back to the place where you live?: Yes      Need for Family Participation in Patient Care: Yes (Comment) Care giver support system in place?: Yes (comment) Current home services: DME Criminal Activity/Legal Involvement Pertinent to Current Situation/Hospitalization:  No - Comment as needed  Activities of Daily Living      Permission Sought/Granted Permission sought to share information with : Case Manager, Customer service manager, Family Supports Permission granted to share information with : Yes, Verbal Permission Granted              Emotional Assessment Appearance:: Appears stated age Attitude/Demeanor/Rapport: Intubated (Following Commands or Not Following Commands) Affect (typically observed): Unable to Assess   Alcohol / Substance Use: Not Applicable Psych Involvement: No (comment)  Admission diagnosis:  Respiratory arrest ([place]) [R09.2] Hyponatremia [E87.1] Ileus ([place]) [K56.7] Septic shock ([place]) [A41.9, R65.21] Patient Active Problem List   Diagnosis Date Noted   Cardiac arrest ([place])    Acute hypoxemic respiratory failure ([place])    Septic shock ([place]) 12/09/2021   Anxiety 11/18/2021   Bed sore on buttock, right, unstageable ([place]) 11/18/2021   Severe protein-calorie malnutrition ([place]) 11/13/2021   Microscopic hematuria 10/29/2021   S/P percutaneous endoscopic gastrostomy (PEG) tube placement ([place]) 10/29/2021   ALS (amyotrophic lateral sclerosis) ([place]) 01/11/2019   PCP:  [name], MD Pharmacy:   [facility] [address] [place] [address] Phone: [phone] Fax: [phone]     Social Determinants of Health (SDOH) Interventions    Readmission Risk Interventions     No data to display

## 2021-12-22 NOTE — Consult Note (Incomplete)
Consultation Note Date: 12/22/2021   Patient Name: Jerry [name]  DOB: [birth year]  MRN: [id number]  Age / Sex: 51 y.o., male  PCP: [name], MD Referring Physician: Albertine Patricia, MD  Reason for Consultation: Establishing goals of care and Psychosocial/spiritual support  HPI/Patient Profile: 51 y.o. male  with past medical history of *** admitted on 12/09/2021 with ***.  51 y/o male with ALS admitted on 6/7 in the setting of acute hypoxemic respiratory failure requiring intubation and mechanical ventilation.  He had a brief cardiac arrest in the ER around the time of intubation. He had another cardiac arrest in the ICU on the day of admission.    12/09/2021 intubated. Reportedly brief code in the ED with rapid response RN. Arrived in the ICU. Became hypotensive despite vasopressor support. Lost pulse again and CPR performed with ROSC. Bronchoscopy with erythematous airways with scant thin secretions 6/12 s/p trach 6/14 failed ATC after 10 minutes 6/16 bronchoscopy> thick mucus removed from RLL; culture sent> pseudomonas 6/17 started inhaled tobi, daily azithro   Clinical Assessment and Goals of Care:  This NP Wadie Lessen reviewed medical records, received report from team, assessed the patient and then meet at the patient's bedside  along with his wife/[name] Carlota Raspberry to discuss diagnosis, prognosis, GOC, EOL wishes disposition and options.   Concept of Palliative Care was introduced as specialized medical care for people and their families living with serious illness.  If focuses on providing relief from the symptoms and stress of a serious illness.  The goal is to improve quality of life for both the patient and the family.  Created space and opportunity for patient  and family to explore thoughts and feelings regarding current medical situation     A  discussion was had today regarding advanced  directives.  Concepts specific to code status, artifical feeding and hydration, continued IV antibiotics and rehospitalization was had.  The difference between a aggressive medical intervention path  and a palliative comfort care path for this patient at this time was had.  Values and goals of care important to patient and family were attempted to be elicited.      Natural trajectory and expectations at EOL were discussed.  Questions and concerns addressed.  Patient  encouraged to call with questions or concerns.     PMT will continue to support holistically.           No documented healthcare power of attorney documents noted.  Patient's wife his main support and decision maker in the event the patient cannot speak for himself.  MOST form noted from 06/04/2020      SUMMARY OF RECOMMENDATIONS    Code Status/Advance Care Planning: Full code   Symptom Management:  ***  Palliative Prophylaxis:  Aspiration, Bowel Regimen, Delirium Protocol, Frequent Pain Assessment, and Oral Care  Additional Recommendations (Limitations, Scope, Preferences): Full Scope Treatment  Psycho-social/Spiritual:  Desire for further Chaplaincy support:{YES NO:22349} Additional Recommendations: {PAL SOCIAL:21064}  Prognosis:  Unable to determine  Discharge Planning: To Be Determined  Primary Diagnoses: Present on Admission:  Septic shock ([place])  ALS (amyotrophic lateral sclerosis) ([place])   I have reviewed the medical record, interviewed the patient and family, and examined the patient. The following aspects are pertinent.  Past Medical History:  Diagnosis Date   ALS (amyotrophic lateral sclerosis) ([place])    COVID-19 virus infection 06/2020   Meningitis 2003   spinal   Morbid obesity ([place])    Prediabetes 11/05/2016   A1C 5.7 on 11/05/16   Social History   Socioeconomic History   Marital status: Married    Spouse name: [name]   Number of children: 3   Years of education: Not on file    Highest education level: High school graduate  Occupational History   Occupation: Mining engineer    Comment: self employed   Occupation: Ship broker    Comment: [employer]  Tobacco Use   Smoking status: Never   Smokeless tobacco: Never  Scientific laboratory technician Use: Never used  Substance and Sexual Activity   Alcohol use: No    Alcohol/week: 0.0 standard drinks of alcohol   Drug use: No   Sexual activity: Yes    Partners: Female  Other Topics Concern   Not on file  Social History Narrative   Lives with wife and 3 children   Approved for disability 02/2019 for ALS    Social Determinants of Health   Financial Resource Strain: Low Risk  (09/04/2020)   Overall Financial Resource Strain (CARDIA)    Difficulty of Paying Living Expenses: Not very hard  Food Insecurity: No Food Insecurity (09/04/2020)   Hunger Vital Sign    Worried About Running Out of Food in the Last Year: Never true    [place] in the Last Year: Never true  Transportation Needs: No Transportation Needs (09/04/2020)   PRAPARE - Hydrologist (Medical): No    Lack of Transportation (Non-Medical): No  Physical Activity: Inactive (09/04/2020)   Exercise Vital Sign    Days of Exercise per Week: 0 days    Minutes of Exercise per Session: 0 min  Stress: No Stress Concern Present (09/04/2020)   [place]    Feeling of Stress : Not at all  Social Connections: Moderately Isolated (09/04/2020)   Social Connection and Isolation Panel [NHANES]    Frequency of Communication with Friends and Family: More than three times a week    Frequency of Social Gatherings with Friends and Family: More than three times a week    Attends Religious Services: Never    Marine scientist or Organizations: No    Attends Music therapist: Never    Marital Status: Married   Family History  Problem Relation Age of Onset   Diabetes  Father    Diabetes Mother    Diabetes Sister    Hypertension Sister    Diabetes Brother    Diabetes Sister    Fibromyalgia Sister    Diabetes Sister    Scheduled Meds:  azithromycin  250 mg Per Tube Daily   Chlorhexidine Gluconate Cloth  6 each Topical Daily   enoxaparin (LOVENOX) injection  40 mg Subcutaneous Q24H   feeding supplement (KATE [place]ARMS STANDARD 1.4)  325 mL Per Tube QID   free water  100 mL Per Tube QID   guaiFENesin  15 mL Per Tube Q6H   heparin lock flush  500 Units Intracatheter Q30 days  melatonin  5 mg Per Tube QHS   mouth rinse  15 mL Mouth Rinse Q2H   pantoprazole sodium  40 mg Per Tube Daily   polyethylene glycol  17 g Per Tube Daily   scopolamine  1 patch Transdermal Q72H   sennosides  10 mL Per Tube QHS   sodium chloride flush  10-40 mL Intracatheter Q12H   sodium chloride HYPERTONIC  4 mL Nebulization BID   tobramycin (PF)  300 mg Nebulization BID   Continuous Infusions:  sodium chloride     PRN Meds:.Place/Maintain arterial line **AND** sodium chloride, acetaminophen, alum & mag hydroxide-simeth, bisacodyl, diphenhydrAMINE, heparin lock flush **AND** heparin lock flush, lip balm, polyvinyl alcohol, sodium chloride, sodium chloride flush Medications Prior to Admission:  Prior to Admission medications   Medication Sig Start Date End Date Taking? Authorizing Provider  bisacodyl (DULCOLAX) 10 MG suppository Place 10 mg rectally daily as needed for severe constipation. 09/18/21  Yes [provider]  EPINEPHrine (EPIPEN 2-PAK) 0.3 mg/0.3 mL IJ SOAJ injection Inject 0.3 mLs (0.3 mg total) into the muscle as needed for anaphylaxis. 07/19/19  Yes Sowles, Drue Stager, MD  hydrOXYzine (ATARAX) 10 MG/5ML syrup Place 5-10 mLs (10-20 mg total) into feeding tube at bedtime. Patient taking differently: Place 10-20 mg into feeding tube at bedtime as needed (SLEEP). 11/18/21  Yes Sowles, Drue Stager, MD  LORazepam (LORAZEPAM INTENSOL) 2 MG/ML concentrated solution Take 0.5  mLs (1 mg total) by mouth daily as needed for anxiety. 11/18/21  Yes Sowles, Drue Stager, MD  Morphine Sulfate (MORPHINE CONCENTRATE) 10 mg / 0.5 ml concentrated solution Take 0.25-0.5 mLs by mouth every 4 (four) hours as needed. 07/22/21  Yes [provider]  polyethylene glycol powder (GLYCOLAX/MIRALAX) 17 GM/SCOOP powder Place 17 g into feeding tube daily as needed for constipation. 07/30/21  Yes [provider]  Sennosides (SENNA) 8.8 MG/5ML LIQD Take 10 mLs by mouth daily as needed for constipation. 07/30/21  Yes [provider]   Allergies  Allergen Reactions   Otho Darner Allergy] Rash   Review of Systems  Physical Exam  Vital Signs: BP 96/69   Pulse 88   Temp 98.7 F (37.1 C) (Oral)   Resp 20   Wt 47.6 kg   SpO2 100%   BMI 17.46 kg/m  Pain Scale: 0-10   Pain Score: 0-No pain   SpO2: SpO2: 100 % O2 Device:SpO2: 100 % O2 Flow Rate: .O2 Flow Rate (L/min): 8 L/min  IO: Intake/output summary:  Intake/Output Summary (Last 24 hours) at 12/22/2021 1522 Last data filed at 12/22/2021 1200 Gross per 24 hour  Intake 1170 ml  Output 2000 ml  Net -830 ml    LBM: Last BM Date : 12/21/21 Baseline Weight: Weight: 42.6 kg Most recent weight: Weight: 47.6 kg     Palliative Assessment/Data:     Discussed with bedside RN  Signed by: Wadie Lessen, NP   Please contact Palliative Medicine Team phone at [phone] for questions and concerns.  For individual provider: See Shea Evans

## 2021-12-23 ENCOUNTER — Telehealth: Payer: Self-pay | Admitting: Acute Care

## 2021-12-23 DIAGNOSIS — R6521 Severe sepsis with septic shock: Secondary | ICD-10-CM | POA: Diagnosis not present

## 2021-12-23 DIAGNOSIS — J9601 Acute respiratory failure with hypoxia: Secondary | ICD-10-CM | POA: Diagnosis not present

## 2021-12-23 DIAGNOSIS — A419 Sepsis, unspecified organism: Secondary | ICD-10-CM | POA: Diagnosis not present

## 2021-12-23 LAB — GLUCOSE, CAPILLARY
Glucose-Capillary: 102 mg/dL — ABNORMAL HIGH (ref 70–99)
Glucose-Capillary: 124 mg/dL — ABNORMAL HIGH (ref 70–99)
Glucose-Capillary: 76 mg/dL (ref 70–99)
Glucose-Capillary: 80 mg/dL (ref 70–99)
Glucose-Capillary: 97 mg/dL (ref 70–99)
Glucose-Capillary: 97 mg/dL (ref 70–99)

## 2021-12-23 MED ORDER — SODIUM CHLORIDE 3 % IN NEBU
4.0000 mL | INHALATION_SOLUTION | RESPIRATORY_TRACT | Status: AC | PRN
Start: 2021-12-23 — End: 2021-12-26

## 2021-12-23 MED ORDER — FREE WATER
100.0000 mL | Freq: Every day | Status: DC
  Administered 2021-12-23 – 2021-12-28 (×23): 100 mL

## 2021-12-23 MED ORDER — KATE FARMS STANDARD 1.4 PO LIQD
325.0000 mL | Freq: Every day | ORAL | Status: DC
  Administered 2021-12-23 – 2021-12-28 (×24): 325 mL
  Filled 2021-12-23 (×29): qty 325

## 2021-12-23 NOTE — Progress Notes (Signed)
PROGRESS NOTE    Jerry [name]  SWF:[id number] DOB: Apr 06, 1971 DOA: 12/09/2021 PCP: [name], MD    Brief Narrative:   Jerry [name] is a 51 y.o. male with past medical history significant for ALS presented to [facility] ED on 6/7 via EMS after being found by family altered, less responsive with oxygen saturation dropping to the 60s.  Patient is followed by [facility] and now ALS has progressed to the point in which he is wheelchair-bound; in addition to increased respiratory distress over the last few weeks.  Recently had PEG tube placement.  On arrival to the emergency department he was intubated immediately in which she had a brief cardiac arrest in the ED at the time of intubation with recurrent cardiac arrest in the ICU on day of admission.  Significant Hospital events: 12/09/2021 intubated. Reportedly brief code in the ED with rapid response RN. Arrived in the ICU. Became hypotensive despite vasopressor support. Lost pulse again and CPR performed with ROSC. Bronchoscopy with erythematous airways with scant thin secretions 6/12 s/p trach 6/14 failed ATC after 10 minutes 6/16 bronchoscopy> thick mucus removed from RLL; culture sent> pseudomonas 6/17 started inhaled tobi, daily azithro 6/20 transferred from [facility] to the hospitalist service 6/22 Remains stable on minimal vent setting, no issues overnight   Assessment & Plan:   Acute on chronic hypoxemic/hypercarbic respiratory failure 2/2 progressive neuromuscular disease/ALS Patient presenting to ED via EMS after being found altered, with SPO2 in the 60s.  Etiology likely secondary to progressive neuromuscular disease with underlying ALS.  Patient was intubated on admission and likely now vent dependent given progression of ALS.  Patient underwent tracheostomy placement on 12/14/2021. --PCCM following, appreciate assistance --Pulmonary hygiene  Pseudomonas pneumonia Respiratory culture from bronchoscopy with pansensitive  Pseudomonas. --Azithromycin 250 mg per tube daily --Inhaled tobramycin 300 mg every 12 hours  Progressive ALS Patient has been bedbound for the last year currently vent dependent.  Followed by [facility]. --Continue vent --Continue tube feeds via PEG tube --Pending LTAC placement   DVT prophylaxis: enoxaparin (LOVENOX) injection 40 mg Start: 12/21/21 1145    Code Status: Full Code Family Communication: Updated family present at bedside this morning  Disposition Plan:  Level of care: ICU Status is: Inpatient Remains inpatient appropriate because: Pending LTAC placement    Consultants:  PCCM  Procedures:  Tracheostomy 6/12, Dr. Tacy Learn Bronchoscopy 6/12  Antimicrobials:  Azithromycin 6/17>> Inhaled tobramycin 6/17>> Zosyn 6/9 - 6/15 Unasyn 6/7 - 6/9   Subjective: Patient seen examined bedside, resting comfortably.  Family present.  Using computer to communicate.  Only complaint of feeling hungry and not receiving adequate nutrition with tube feeds.  Awaiting LTAC placement.  No other questions or concerns at this time.  Denies chest pain, no shortness of breath, no abdominal pain.  No acute concerns overnight per nursing staff  Objective: Vitals:   12/23/21 0400 12/23/21 0409 12/23/21 0735 12/23/21 0811  BP: 98/68   101/64  Pulse: 79   87  Resp: 16   20  Temp:  98.7 F (37.1 C) 98.1 F (36.7 C)   TempSrc:  Oral Oral   SpO2: 100%   100%  Weight:        Intake/Output Summary (Last 24 hours) at 12/23/2021 1106 Last data filed at 12/23/2021 1025 Gross per 24 hour  Intake 1160 ml  Output 1500 ml  Net -340 ml   Filed Weights   12/18/21 0322 12/19/21 0500 12/20/21 0500  Weight: 47.9  kg 47.6 kg 47.6 kg    Examination:  Physical Exam: GEN: NAD, alert, chronically ill in appearance/cachectic; lying in bed on vent HEENT: NCAT, PERRL, EOMI, sclera clear, MMM, trach noted PULM: Breath sounds slightly diminished bilateral bases, no  wheezing/crackles, normal respiratory effort with out accessory muscle use, on vent CV: RRR w/o M/G/R GI: abd soft, NTND, NABS, no R/G/M MSK: no peripheral edema Integumentary: dry/intact, no rashes or wounds    Data Reviewed: I have personally reviewed following labs and imaging studies  CBC: Recent Labs  Lab 12/17/21 0339 12/18/21 0440 12/19/21 0512 12/20/21 0008 12/22/21 0235  WBC 4.5 6.3 5.7 5.6 4.4  NEUTROABS  --  4.9 3.8  --   --   HGB 9.1* 8.5* 8.9* 8.9* 8.8*  HCT 27.0* 25.6* 26.5* 26.8* 26.2*  MCV 89.7 91.1 92.0 92.7 91.9  PLT 169 183 213 199 431   Basic Metabolic Panel: Recent Labs  Lab 12/17/21 0339 12/18/21 0440 12/19/21 0512 12/20/21 0008 12/22/21 0235  NA 133* 139 135 137 138  K 3.6 3.8 3.8 4.5 3.8  CL 103 108 106 111 109  CO2 21* '24 24 23 24  '$ GLUCOSE 90 90 90 148* 91  BUN '9 8 8 12 9  '$ CREATININE <0.30* <0.30* <0.30* <0.30* <0.30*  CALCIUM 8.2* 8.4* 8.3* 8.0* 8.6*  MG 2.3  --   --   --   --   PHOS 3.4  --   --   --   --    GFR: CrCl cannot be calculated (This lab value cannot be used to calculate CrCl because it is not a number: <0.30). Liver Function Tests: Recent Labs  Lab 12/18/21 0440 12/19/21 0512  AST 15 14*  ALT 29 27  ALKPHOS 47 54  BILITOT 0.8 0.4  PROT 4.9* 5.4*  ALBUMIN 2.4* 2.5*   No results for input(s): "LIPASE", "AMYLASE" in the last 168 hours. No results for input(s): "AMMONIA" in the last 168 hours. Coagulation Profile: No results for input(s): "INR", "PROTIME" in the last 168 hours. Cardiac Enzymes: No results for input(s): "CKTOTAL", "CKMB", "CKMBINDEX", "TROPONINI" in the last 168 hours. BNP (last 3 results) No results for input(s): "PROBNP" in the last 8760 hours. HbA1C: No results for input(s): "HGBA1C" in the last 72 hours. CBG: Recent Labs  Lab 12/22/21 1530 12/22/21 1944 12/22/21 2313 12/23/21 0407 12/23/21 0733  GLUCAP 66* 91 134* 76 80   Lipid Profile: No results for input(s): "CHOL", "HDL",  "LDLCALC", "TRIG", "CHOLHDL", "LDLDIRECT" in the last 72 hours. Thyroid Function Tests: No results for input(s): "TSH", "T4TOTAL", "FREET4", "T3FREE", "THYROIDAB" in the last 72 hours. Anemia Panel: No results for input(s): "VITAMINB12", "FOLATE", "FERRITIN", "TIBC", "IRON", "RETICCTPCT" in the last 72 hours. Sepsis Labs: No results for input(s): "PROCALCITON", "LATICACIDVEN" in the last 168 hours.  Recent Results (from the past 240 hour(s))  Culture, Respiratory w Gram Stain     Status: None   Collection Time: 12/18/21 11:19 AM   Specimen: Tracheal Aspirate; Respiratory  Result Value Ref Range Status   Specimen Description TRACHEAL ASPIRATE  Final   Special Requests NONE  Final   Gram Stain   Final    RARE WBC PRESENT,BOTH PMN AND MONONUCLEAR NO ORGANISMS SEEN Performed at [facility], [address] [place], [place] [address]    Culture FEW PSEUDOMONAS AERUGINOSA  Final   Report Status 12/20/2021 FINAL  Final   Organism ID, Bacteria PSEUDOMONAS AERUGINOSA  Final      Susceptibility  Pseudomonas aeruginosa - MIC*    CEFTAZIDIME >=64 RESISTANT Resistant     CIPROFLOXACIN <=0.25 SENSITIVE Sensitive     GENTAMICIN <=1 SENSITIVE Sensitive     IMIPENEM 1 SENSITIVE Sensitive     * FEW PSEUDOMONAS AERUGINOSA         Radiology Studies: No results found.      Scheduled Meds:  azithromycin  250 mg Per Tube Daily   Chlorhexidine Gluconate Cloth  6 each Topical Daily   enoxaparin (LOVENOX) injection  40 mg Subcutaneous Q24H   feeding supplement (KATE FARMS STANDARD 1.4)  325 mL Per Tube QID   free water  100 mL Per Tube QID   guaiFENesin  15 mL Per Tube Q6H   heparin lock flush  500 Units Intracatheter Q30 days   melatonin  5 mg Per Tube QHS   mouth rinse  15 mL Mouth Rinse Q2H   pantoprazole sodium  40 mg Per Tube Daily   polyethylene glycol  17 g Per Tube Daily   scopolamine  1 patch Transdermal Q72H   sennosides  10 mL Per Tube QHS   sodium chloride flush   10-40 mL Intracatheter Q12H   sodium chloride HYPERTONIC  4 mL Nebulization BID   tobramycin (PF)  300 mg Nebulization BID   Continuous Infusions:  sodium chloride       LOS: 14 days    Time spent: 48 minutes spent on chart review, discussion with nursing staff, consultants, updating family and interview/physical exam; more than 50% of that time was spent in counseling and/or coordination of care.    Bradlee Heitman J [place] DO [facility] Available via Epic secure chat 7am-7pm After these hours, please refer to coverage provider listed on [email or web address] 12/23/2021, 11:06 AM

## 2021-12-23 NOTE — Progress Notes (Signed)
NAME:  Jerry [name], MRN:  [id number], DOB:  [birth year], LOS: 87 ADMISSION DATE:  12/09/2021, CONSULTATION DATE:  6/7 REFERRING MD: Ralene Bathe EDP , CHIEF COMPLAINT:  Dyspnea   History of Present Illness:  51 y/o male with ALS admitted on 6/7 in the setting of acute hypoxemic respiratory failure requiring intubation and mechanical ventilation.  He had a brief cardiac arrest in the ER around the time of intubation. He had another cardiac arrest in the ICU on the day of admission.    Pertinent  Medical History  ALS COVID-19 Morbid obesity Prediabetes  Significant Hospital Events: Including procedures, antibiotic start and stop dates in addition to other pertinent events   12/09/2021 intubated. Reportedly brief code in the ED with rapid response RN. Arrived in the ICU. Became hypotensive despite vasopressor support. Lost pulse again and CPR performed with ROSC. Bronchoscopy with erythematous airways with scant thin secretions 6/12 s/p trach 6/14 failed ATC after 10 minutes 6/16 bronchoscopy> thick mucus removed from RLL; culture sent> pseudomonas 6/17 started inhaled tobi, daily azithro 6/21 Remains stable on minimal vent setting, no issues overnight   Interim History / Subjective:  States he is concerned that he is no receiving adequate nutrition  Family at bedside and updated as well   Objective   Blood pressure 98/68, pulse 79, temperature 98.7 F (37.1 C), temperature source Oral, resp. rate 16, weight 47.6 kg, SpO2 100 %.    Vent Mode: PRVC FiO2 (%):  [30 %] 30 % Set Rate:  [20 bmp] 20 bmp Vt Set:  [490 mL] 490 mL PEEP:  [5 cmH20] 5 cmH20 Plateau Pressure:  [15 cmH20-20 cmH20] 19 cmH20   Intake/Output Summary (Last 24 hours) at 12/23/2021 6237 Last data filed at 12/23/2021 0000 Gross per 24 hour  Intake 2410 ml  Output 1100 ml  Net 1310 ml    Filed Weights   12/18/21 0322 12/19/21 0500 12/20/21 0500  Weight: 47.9 kg 47.6 kg 47.6 kg    Examination: General: Acute on  chronically ill appearing cachetic adult male lying in bed on mechanical ventilation via trach, in NAD HEENT: 8 cuffed trach midline, MM pink/moist, PERRL,  Neuro: Alert and interactive on vent, uses computer to communicate  CV: s1s2 regular rate and rhythm, no murmur, rubs, or gallops,  PULM:  Faint rhonchi bilaterally, no increased work of breathing, no added breath sounds, tolerating vent  GI: soft, bowel sounds active in all 4 quadrants, non-tender, non-distended, tolerating TF Extremities: warm/dry, no edema  Skin: no rashes or lesions  Resolved Hospital Problem list   Septic shock Hyponatremia Hypokalemia Hypophosphatemia Ileus in setting of electrolyte imbalance  S/P in hospital PEA cardiac arrest due to respiratory failure Atelectasis, right lower lobe collapse  Assessment & Plan:  Acute on chronic hypoxemic and hypercarbic respiratory failure due to progressive neuromuscular disease  -Now s/p tracheostomy > failed ATC attempt on 6/14 Pneumonia due to pansensitive pseudomonas -Improved pneumonia but has persistently positive tracheobronchitis Thick pulmonary secretions, at risk for RLL collapse P: Continue ventilator support with lung protective strategies  Wean PEEP and FiO2 for sats greater than 90%. Head of bed elevated 30 degrees. Plateau pressures less than 30 cm H20.  Follow intermittent chest x-ray and ABG.   SAT/SBT as tolerated, mentation preclude extubation  Ensure adequate pulmonary hygiene  Follow cultures  VAP bundle in place  PAD protocol Continue Azithromycin and inhaled Tobramycin for colonization of pseudomonas  Guaifenesin Follow up pulmonary apt with Dr. [name] 7/31 at [facility]  upon discharge   Progressive ALS on NIV -Patient has been bedboud for the last year and is currently vent dependent. ALS management per [facility]  P: Supportive care   Best Practice (right click and "Reselect all SmartList Selections" daily)  Per primary    Critical care time: n/a  Jourden Delmont [name], NP-C [facility] Personal contact information can be found on Amion  12/23/2021, 7:21 AM

## 2021-12-23 NOTE — TOC Progression Note (Signed)
Transition of Care [facility]) - Progression Note    Patient Details  Name: [name] MRN: [id number] Date of Birth: Nov 08, 1970  [facility] CM/SW Contact  Tom-Johnson, Renea Ee, RN Phone Number: 12/23/2021, 12:16 PM  Clinical Narrative:     CM received a call from patient's wife, Fredda Hammed stating she is still undecided if patient should go home or LTAC. [name] prefers taking patient home but will not have night staffing as [facility] does not have night nurses to cover his care. Fredda Hammed states she called [employer] to see if they could cover night nurses. [employer] told her she would need an agency and home health order from MD. MD notified and order placed. CM spoke with Rob at [facility] and he states if [employer] approves nurse care from another agency, it will be duplicating of service as Medicaid is paying for his day time services. Rob will call the main office and get back with CM. CM will continue to follow with needs.    Expected Discharge Plan: [place] Barriers to Discharge: Continued Medical Work up  Expected Discharge Plan and Services Expected Discharge Plan: [place]   Discharge Planning Services: CM Consult Post Acute Care Choice: Home Health, Durable Medical Equipment Living arrangements for the past 2 months: Single Family Home                 DME Arranged: [facility] ([facility]) DME Agency: [employer] Date DME Agency Contacted: 12/15/21 Time DME Agency Contacted: [phone] Representative spoke with at DME Agency: [place] ([place]) Interventions    Readmission Risk Interventions     No data to display

## 2021-12-23 NOTE — Telephone Encounter (Signed)
Created call in error

## 2021-12-23 NOTE — Chronic Care Management (AMB) (Incomplete)
Care Management    RN Visit Note   Name: Jerry [name] MRN: [id number] DOB: [birth year]  Subjective: Jerry [name] is a 51 y.o. year old male who is a primary care patient of Jerry Sizer, MD. The care management team was consulted for assistance with disease management and care coordination needs.    Engaged with patient by telephone for follow up visit in response to provider referral for case management and care coordination services.   Consent to Services:   Jerry [name] was given information about Care Management services including:  Care Management services includes personalized support from designated clinical staff supervised by his physician, including individualized plan of care and coordination with other care providers 24/7 contact phone numbers for assistance for urgent and routine care needs. The patient may stop case management services at any time by phone call to the office staff.  Patient agreed to services and consent obtained.   Assessment: Review of patient past medical history, allergies, medications, health status, including review of consultants reports, laboratory and other test data, was performed as part of comprehensive evaluation and provision of chronic care management services.   SDOH (Social Determinants of Health) assessments and interventions performed: No  Care Plan  Allergies  Allergen Reactions   Crab [Shellfish Allergy] Rash    Facility-Administered Encounter Medications as of 12/22/2021  Medication   0.9 %  sodium chloride infusion   acetaminophen (TYLENOL) tablet 650 mg   alum & mag hydroxide-simeth (MAALOX/MYLANTA) 200-200-20 MG/5ML suspension 30 mL   azithromycin (ZITHROMAX) tablet 250 mg   bisacodyl (DULCOLAX) suppository 10 mg   Chlorhexidine Gluconate Cloth 2 % PADS 6 each   diphenhydrAMINE (BENADRYL) 12.5 MG/5ML elixir 12.5 mg   enoxaparin (LOVENOX) injection 40 mg   feeding supplement (KATE FARMS STANDARD 1.4) liquid 325 mL   free  water 100 mL   guaiFENesin (ROBITUSSIN) 100 MG/5ML liquid 15 mL   heparin lock flush 100 unit/mL   And   heparin lock flush 100 unit/mL   lip balm (CARMEX) ointment   melatonin tablet 5 mg   Oral care mouth rinse   pantoprazole sodium (PROTONIX) 40 mg/20 mL oral suspension 40 mg   polyethylene glycol (MIRALAX / GLYCOLAX) packet 17 g   polyvinyl alcohol (LIQUIFILM TEARS) 1.4 % ophthalmic solution 1 drop   scopolamine (TRANSDERM-SCOP) 1 MG/3DAYS 1.5 mg   sennosides (SENOKOT) 8.8 MG/5ML syrup 10 mL   sodium chloride (OCEAN) 0.65 % nasal spray 1 spray   sodium chloride flush (NS) 0.9 % injection 10-40 mL   sodium chloride flush (NS) 0.9 % injection 10-40 mL   sodium chloride HYPERTONIC 3 % nebulizer solution 4 mL   tobramycin (PF) (TOBI) nebulizer solution 300 mg   [DISCONTINUED] feeding supplement (KATE FARMS STANDARD 1.4) liquid 325 mL   [DISCONTINUED] free water 100 mL   [DISCONTINUED] sodium chloride HYPERTONIC 3 % nebulizer solution 4 mL   Outpatient Encounter Medications as of 12/22/2021  Medication Sig   bisacodyl (DULCOLAX) 10 MG suppository Place 10 mg rectally daily as needed for severe constipation.   EPINEPHrine (EPIPEN 2-PAK) 0.3 mg/0.3 mL IJ SOAJ injection Inject 0.3 mLs (0.3 mg total) into the muscle as needed for anaphylaxis.   hydrOXYzine (ATARAX) 10 MG/5ML syrup Place 5-10 mLs (10-20 mg total) into feeding tube at bedtime. (Patient taking differently: Place 10-20 mg into feeding tube at bedtime as needed (SLEEP).)   LORazepam (LORAZEPAM INTENSOL) 2 MG/ML concentrated solution Take 0.5 mLs (1 mg total) by mouth daily as needed  for anxiety.   Morphine Sulfate (MORPHINE CONCENTRATE) 10 mg / 0.5 ml concentrated solution Take 0.25-0.5 mLs by mouth every 4 (four) hours as needed.   polyethylene glycol powder (GLYCOLAX/MIRALAX) 17 GM/SCOOP powder Place 17 g into feeding tube daily as needed for constipation.   Sennosides (SENNA) 8.8 MG/5ML LIQD Take 10 mLs by mouth daily as needed  for constipation.    Patient Active Problem List   Diagnosis Date Noted   Cardiac arrest [facility])    Acute hypoxemic respiratory failure ([place])    Septic shock ([place]) 12/09/2021   Anxiety 11/18/2021   Bed sore on buttock, right, unstageable ([place]) 11/18/2021   Severe protein-calorie malnutrition ([place]) 11/13/2021   Microscopic hematuria 10/29/2021   S/P percutaneous endoscopic gastrostomy (PEG) tube placement ([place]) 10/29/2021   ALS (amyotrophic lateral sclerosis) ([place]) 01/11/2019       PLAN Will follow up this week.   Jerry [name] [facility] Care Management [facility] [phone]

## 2021-12-24 ENCOUNTER — Encounter (HOSPITAL_COMMUNITY): Payer: Self-pay | Admitting: Internal Medicine

## 2021-12-24 ENCOUNTER — Other Ambulatory Visit: Payer: Self-pay

## 2021-12-24 DIAGNOSIS — Z515 Encounter for palliative care: Secondary | ICD-10-CM

## 2021-12-24 DIAGNOSIS — J9601 Acute respiratory failure with hypoxia: Secondary | ICD-10-CM | POA: Diagnosis not present

## 2021-12-24 DIAGNOSIS — Z9911 Dependence on respirator [ventilator] status: Secondary | ICD-10-CM | POA: Diagnosis not present

## 2021-12-24 DIAGNOSIS — A419 Sepsis, unspecified organism: Secondary | ICD-10-CM | POA: Diagnosis not present

## 2021-12-24 DIAGNOSIS — G1221 Amyotrophic lateral sclerosis: Secondary | ICD-10-CM | POA: Diagnosis not present

## 2021-12-24 DIAGNOSIS — R6521 Severe sepsis with septic shock: Secondary | ICD-10-CM | POA: Diagnosis not present

## 2021-12-24 LAB — GLUCOSE, CAPILLARY
Glucose-Capillary: 87 mg/dL (ref 70–99)
Glucose-Capillary: 138 mg/dL — ABNORMAL HIGH (ref 70–99)
Glucose-Capillary: 107 mg/dL — ABNORMAL HIGH (ref 70–99)
Glucose-Capillary: 108 mg/dL — ABNORMAL HIGH (ref 70–99)
Glucose-Capillary: 124 mg/dL — ABNORMAL HIGH (ref 70–99)
Glucose-Capillary: 150 mg/dL — ABNORMAL HIGH (ref 70–99)

## 2021-12-24 MED ORDER — STERILE WATER FOR INJECTION IJ SOLN
INTRAMUSCULAR | Status: AC
  Filled 2021-12-24: qty 10

## 2021-12-24 MED FILL — Medication: Qty: 1 | Status: AC

## 2021-12-24 NOTE — Progress Notes (Signed)
PROGRESS NOTE    Jerry [name]  EHO:[id number] DOB: 09-Jan-1971 DOA: 12/09/2021 PCP: [name], MD    Brief Narrative:   Jerry [name] is a 51 y.o. male with past medical history significant for ALS presented to [facility] ED on 6/7 via EMS after being found by family altered, less responsive with oxygen saturation dropping to the 60s.  Patient is followed by [place] clinic and now ALS has progressed to the point in which he is wheelchair-bound; in addition to increased respiratory distress over the last few weeks.  Recently had PEG tube placement.  On arrival to the emergency department he was intubated immediately in which she had a brief cardiac arrest in the ED at the time of intubation with recurrent cardiac arrest in the ICU on day of admission.  Significant Hospital events: 12/09/2021 intubated. Reportedly brief code in the ED with rapid response RN. Arrived in the ICU. Became hypotensive despite vasopressor support. Lost pulse again and CPR performed with ROSC. Bronchoscopy with erythematous airways with scant thin secretions 6/12 s/p trach 6/14 failed ATC after 10 minutes 6/16 bronchoscopy> thick mucus removed from RLL; culture sent> pseudomonas 6/17 started inhaled tobi, daily azithro 6/20 transferred from [facility] to the hospitalist service 6/22 Remains stable on minimal vent setting, no issues overnight  6/23 Stable on minimal vent settings, awaiting LTACH vs home w/ HH if able to obtain 24h nursing support, discussed with Dr. Lamonte Sakai this am, continue abx w/ azithromycin and nebulized tobramycin until follows up with Dr. [name] outpatient  Assessment & Plan:   Acute on chronic hypoxemic/hypercarbic respiratory failure 2/2 progressive neuromuscular disease/ALS Patient presenting to ED via EMS after being found altered, with SPO2 in the 60s.  Etiology likely secondary to progressive neuromuscular disease with underlying ALS.  Patient was intubated on admission and likely now  vent dependent given progression of ALS.  Patient underwent tracheostomy placement on 12/14/2021. --PCCM following, appreciate assistance --Pulmonary hygiene  Pseudomonas pneumonia Respiratory culture from bronchoscopy with pansensitive Pseudomonas. --Azithromycin 250 mg per tube daily --Inhaled tobramycin 300 mg every 12 hours --Per Dr. Malvin Johns and [facility] will continue antibiotics until outpatient follow-up  Progressive ALS Patient has been bedbound for the last year currently vent dependent.  Followed by [place] clinic. --Continue vent --Continue tube feeds via PEG tube --Pending LTAC placement vs home with home health if can obtain 24-hour nursing support   DVT prophylaxis: enoxaparin (LOVENOX) injection 40 mg Start: 12/21/21 1145    Code Status: Full Code Family Communication: No family present at bedside this morning  Disposition Plan:  Level of care: ICU Status is: Inpatient Remains inpatient appropriate because: Pending LTAC placement    Consultants:  PCCM  Procedures:  Tracheostomy 6/12, Dr. Tacy Learn Bronchoscopy 6/12  Antimicrobials:  Azithromycin 6/17>> Inhaled tobramycin 6/17>> Zosyn 6/9 - 6/15 Unasyn 6/7 - 6/9   Subjective: Patient seen examined bedside, resting comfortably.  No family present.  Using computer to communicate.  No complaints this morning.  Awaiting LTAC placement versus home with home health if able to obtain 24-hour nursing support.  No other questions or concerns at this time.  Denies chest pain, no shortness of breath, no abdominal pain.  No acute concerns overnight per nursing staff.  Discussed case with Dr. Malvin Johns, PCCM this morning.  Objective: Vitals:   12/24/21 0827 12/24/21 0900 12/24/21 1000 12/24/21 1100  BP: (!) 93/54 100/62 104/75 104/70  Pulse: 75 80 87 89  Resp: '20 20 13 17  '$ Temp:  98.9 F (37.2 C)  TempSrc:    Oral  SpO2: 100% 100% 99% 100%  Weight:       No intake or output data in the 24 hours ending  12/24/21 1149  Filed Weights   12/19/21 0500 12/20/21 0500 12/24/21 0500  Weight: 47.6 kg 47.6 kg 54.9 kg    Examination:  Physical Exam: GEN: NAD, alert, chronically ill in appearance/cachectic; lying in bed on vent HEENT: NCAT, PERRL, EOMI, sclera clear, MMM, trach noted PULM: Breath sounds slightly diminished bilateral bases, no wheezing/crackles, normal respiratory effort with out accessory muscle use, on vent CV: RRR w/o M/G/R GI: abd soft, NTND, NABS, no R/G/M MSK: no peripheral edema Integumentary: dry/intact, no rashes or wounds    Data Reviewed: I have personally reviewed following labs and imaging studies  CBC: Recent Labs  Lab 12/18/21 0440 12/19/21 0512 12/20/21 0008 12/22/21 0235  WBC 6.3 5.7 5.6 4.4  NEUTROABS 4.9 3.8  --   --   HGB 8.5* 8.9* 8.9* 8.8*  HCT 25.6* 26.5* 26.8* 26.2*  MCV 91.1 92.0 92.7 91.9  PLT 183 213 199 213   Basic Metabolic Panel: Recent Labs  Lab 12/18/21 0440 12/19/21 0512 12/20/21 0008 12/22/21 0235  NA 139 135 137 138  K 3.8 3.8 4.5 3.8  CL 108 106 111 109  CO2 '24 24 23 24  '$ GLUCOSE 90 90 148* 91  BUN '8 8 12 9  '$ CREATININE <0.30* <0.30* <0.30* <0.30*  CALCIUM 8.4* 8.3* 8.0* 8.6*   GFR: CrCl cannot be calculated (This lab value cannot be used to calculate CrCl because it is not a number: <0.30). Liver Function Tests: Recent Labs  Lab 12/18/21 0440 12/19/21 0512  AST 15 14*  ALT 29 27  ALKPHOS 47 54  BILITOT 0.8 0.4  PROT 4.9* 5.4*  ALBUMIN 2.4* 2.5*   No results for input(s): "LIPASE", "AMYLASE" in the last 168 hours. No results for input(s): "AMMONIA" in the last 168 hours. Coagulation Profile: No results for input(s): "INR", "PROTIME" in the last 168 hours. Cardiac Enzymes: No results for input(s): "CKTOTAL", "CKMB", "CKMBINDEX", "TROPONINI" in the last 168 hours. BNP (last 3 results) No results for input(s): "PROBNP" in the last 8760 hours. HbA1C: No results for input(s): "HGBA1C" in the last 72  hours. CBG: Recent Labs  Lab 12/23/21 1952 12/23/21 2346 12/24/21 0341 12/24/21 0737 12/24/21 1133  GLUCAP 124* 102* 87 138* 107*   Lipid Profile: No results for input(s): "CHOL", "HDL", "LDLCALC", "TRIG", "CHOLHDL", "LDLDIRECT" in the last 72 hours. Thyroid Function Tests: No results for input(s): "TSH", "T4TOTAL", "FREET4", "T3FREE", "THYROIDAB" in the last 72 hours. Anemia Panel: No results for input(s): "VITAMINB12", "FOLATE", "FERRITIN", "TIBC", "IRON", "RETICCTPCT" in the last 72 hours. Sepsis Labs: No results for input(s): "PROCALCITON", "LATICACIDVEN" in the last 168 hours.  Recent Results (from the past 240 hour(s))  Culture, Respiratory w Gram Stain     Status: None   Collection Time: 12/18/21 11:19 AM   Specimen: Tracheal Aspirate; Respiratory  Result Value Ref Range Status   Specimen Description TRACHEAL ASPIRATE  Final   Special Requests NONE  Final   Gram Stain   Final    RARE WBC PRESENT,BOTH PMN AND MONONUCLEAR NO ORGANISMS SEEN Performed at [facility], [address], [place], [place] [address]    Culture FEW PSEUDOMONAS AERUGINOSA  Final   Report Status 12/20/2021 FINAL  Final   Organism ID, Bacteria PSEUDOMONAS AERUGINOSA  Final      Susceptibility  Pseudomonas aeruginosa - MIC*    CEFTAZIDIME >=64 RESISTANT Resistant     CIPROFLOXACIN <=0.25 SENSITIVE Sensitive     GENTAMICIN <=1 SENSITIVE Sensitive     IMIPENEM 1 SENSITIVE Sensitive     * FEW PSEUDOMONAS AERUGINOSA         Radiology Studies: No results found.      Scheduled Meds:  azithromycin  250 mg Per Tube Daily   Chlorhexidine Gluconate Cloth  6 each Topical Daily   enoxaparin (LOVENOX) injection  40 mg Subcutaneous Q24H   feeding supplement (KATE FARMS STANDARD 1.4)  325 mL Per Tube 5 X Daily   free water  100 mL Per Tube 5 X Daily   guaiFENesin  15 mL Per Tube Q6H   heparin lock flush  500 Units Intracatheter Q30 days   melatonin  5 mg Per Tube QHS   mouth rinse  15  mL Mouth Rinse Q2H   pantoprazole sodium  40 mg Per Tube Daily   polyethylene glycol  17 g Per Tube Daily   scopolamine  1 patch Transdermal Q72H   sennosides  10 mL Per Tube QHS   sodium chloride flush  10-40 mL Intracatheter Q12H   sterile water (preservative free)       tobramycin (PF)  300 mg Nebulization BID   Continuous Infusions:  sodium chloride       LOS: 15 days    Time spent: 48 minutes spent on chart review, discussion with nursing staff, consultants, updating family and interview/physical exam; more than 50% of that time was spent in counseling and/or coordination of care.    Kallie Depolo J [place] DO [facility] Available via Epic secure chat 7am-7pm After these hours, please refer to coverage provider listed on [email or web address] 12/24/2021, 11:49 AM

## 2021-12-24 NOTE — Progress Notes (Signed)
Speech Language Pathology Treatment: [name] Speaking valve  Patient Details Name: Jerry [name] MRN: [id number] DOB: 1971/04/22 Today's Date: 12/24/2021 Time: 5573-2202 SLP Time Calculation (min) (ACUTE ONLY): 29 min  Assessment / Plan / Recommendation Clinical Impression  Synchronized session for inline PMV with ST and RT who deflated cuff, deep suctioned and adjusted vent settings. Despite having increased pharyngeal and oral secretions this session he was able to clear mucous post cuff deflation faster than prior. Vocal intensity was highest today and articulation improved for increased intelligibility with wife and 3 kids present for his birthday. He requested frequent suctioning of bilateral sulci and toward pharyngeal region throughout session which quickly returned after each suction. Intelligibility in words 60%, phrase 50% also using eye blinks and head shake when needed. Vital signs remained in normal range. Pt can use valve with RT and SLP or RT and RN with full supervision.    HPI HPI: Jerry [name] is a 51 yo male with hx significant for limb-onset ALS with progressive quadriparesis, anarthria and dysphagia (dx 2020). Admitted 6/7 with progressive respiratory decline, requiring urgent intubation. Brief code in ED s/p CPR 2 min. Trach and bronch 6/12. On ventilator. PEG 10/26/21 due to progressive dysphagia.  Pt participates in the ALS clinic at [facility].      SLP Plan  Continue with current plan of care      Recommendations for follow up therapy are one component of a multi-disciplinary discharge planning process, led by the attending physician.  Recommendations may be updated based on patient status, additional functional criteria and insurance authorization.    Recommendations         Patient may use Passy-Muir Speech Valve:  (RT can donn valve with nursing without ST) PMSV Supervision: Full         Oral Care Recommendations: Oral care QID Follow Up  Recommendations: Home health SLP Assistance recommended at discharge: Frequent or constant Supervision/Assistance SLP Visit Diagnosis: Aphonia (R49.1) Plan: Continue with current plan of care           Houston Siren  12/24/2021, 3:39 PM

## 2021-12-24 NOTE — Progress Notes (Signed)
Patient ID: [name], male   DOB: July 10, 1970, 51 y.o.   MRN: [id number]    Progress Note from the Palliative Medicine Team at [facility]   Patient Name: Jerry [name]        Date: 12/24/2021 DOB: Dec 15, 1970  Age: 51 y.o. MRN#: [id number] Attending Physician: [place] Eric J, DO Primary Care Physician: [name], MD Admit Date: 12/09/2021   Medical records reviewed  51 y.o. male   admitted on 12/09/2021 with ALS admitted on 6/7 in the setting of acute hypoxemic respiratory failure requiring intubation and mechanical ventilation.  He had a brief cardiac arrest in the ER around the time of intubation. He had another cardiac arrest in the ICU on the day of admission.     12/09/2021 intubated. Reportedly brief code in the ED with rapid response RN. Arrived in the ICU. Became hypotensive despite vasopressor support. Lost pulse again and CPR performed with ROSC. Bronchoscopy with erythematous airways with scant thin secretions 6/12 s/p trach 6/14 failed ATC after 10 minutes 6/16 bronchoscopy> thick mucus removed from RLL; culture sent> pseudomonas 6/20 remains intubated, stabilized and decisions for transition of care pending Remains stable on minimal vent settings   Patient and family face treatment option decisions, advanced directive decisions and anticipatory care needs.   This NP visited patient at the bedside as a follow up for palliative medicine needs and emotional support.  Patient is awake and alert, family member at bedside.  Wife is not present.  Patient with complaint of oral and throat secretions.,  suction successful.  Phone conversation/collaboration had with bedside nursing and Gerald Leitz NP, CCM regarding overall treatment plan moving forward.  Family in process and working with transition of care team and decision regarding disposition to home versus LTAC  PMT will continue to support holistically  Questions and concerns addressed   Discussed with treatment  team  This nurse practitioner informed  the patient and the attending that I will be out of the hospital until Monday morning.  If the patient is still hospitalized I will follow-up at that time.  Call palliative medicine team phone # [phone] with questions or concerns in the interim   Wadie Lessen NP  Palliative Medicine Team Team Phone # [phone] Pager [phone]

## 2021-12-25 DIAGNOSIS — A419 Sepsis, unspecified organism: Secondary | ICD-10-CM | POA: Diagnosis not present

## 2021-12-25 DIAGNOSIS — R6521 Severe sepsis with septic shock: Secondary | ICD-10-CM | POA: Diagnosis not present

## 2021-12-25 LAB — GLUCOSE, CAPILLARY
Glucose-Capillary: 95 mg/dL (ref 70–99)
Glucose-Capillary: 113 mg/dL — ABNORMAL HIGH (ref 70–99)
Glucose-Capillary: 133 mg/dL — ABNORMAL HIGH (ref 70–99)
Glucose-Capillary: 128 mg/dL — ABNORMAL HIGH (ref 70–99)
Glucose-Capillary: 134 mg/dL — ABNORMAL HIGH (ref 70–99)

## 2021-12-25 MED ORDER — BACLOFEN 5 MG HALF TABLET
5.0000 mg | ORAL_TABLET | Freq: Three times a day (TID) | ORAL | Status: DC | PRN
Start: 2021-12-25 — End: 2022-01-08
  Administered 2021-12-29: 5 mg
  Filled 2021-12-25 (×2): qty 1

## 2021-12-25 MED ORDER — BACLOFEN 1 MG/ML ORAL SUSPENSION: 5.0000 mg | Freq: Three times a day (TID) | ORAL | Status: DC | PRN

## 2021-12-25 NOTE — Progress Notes (Signed)
Patient seen today by trach team for consult.  No education is needed at this time.  All necessary equipment is at beside.   Will continue to follow for progression. Patient is still on full ventilatory support. 

## 2021-12-26 ENCOUNTER — Inpatient Hospital Stay (HOSPITAL_COMMUNITY): Payer: Medicare HMO

## 2021-12-26 DIAGNOSIS — A419 Sepsis, unspecified organism: Secondary | ICD-10-CM | POA: Diagnosis not present

## 2021-12-26 DIAGNOSIS — R6521 Severe sepsis with septic shock: Secondary | ICD-10-CM | POA: Diagnosis not present

## 2021-12-26 LAB — CBC WITH DIFFERENTIAL/PLATELET
Abs Immature Granulocytes: 0.02 10*3/uL (ref 0.00–0.07)
Basophils Absolute: 0 10*3/uL (ref 0.0–0.1)
Basophils Relative: 0 %
Eosinophils Absolute: 0.1 10*3/uL (ref 0.0–0.5)
Eosinophils Relative: 1 %
HCT: 25.7 % — ABNORMAL LOW (ref 39.0–52.0)
Hemoglobin: 8.5 g/dL — ABNORMAL LOW (ref 13.0–17.0)
Immature Granulocytes: 1 %
Lymphocytes Relative: 38 %
Lymphs Abs: 1.5 10*3/uL (ref 0.7–4.0)
MCH: 30.8 pg (ref 26.0–34.0)
MCHC: 33.1 g/dL (ref 30.0–36.0)
MCV: 93.1 fL (ref 80.0–100.0)
Monocytes Absolute: 0.3 10*3/uL (ref 0.1–1.0)
Monocytes Relative: 8 %
Neutro Abs: 2 10*3/uL (ref 1.7–7.7)
Neutrophils Relative %: 52 %
Platelets: 245 10*3/uL (ref 150–400)
RBC: 2.76 MIL/uL — ABNORMAL LOW (ref 4.22–5.81)
RDW: 15.3 % (ref 11.5–15.5)
WBC: 3.9 10*3/uL — ABNORMAL LOW (ref 4.0–10.5)
nRBC: 0 % (ref 0.0–0.2)

## 2021-12-26 LAB — GLUCOSE, CAPILLARY
Glucose-Capillary: 77 mg/dL (ref 70–99)
Glucose-Capillary: 113 mg/dL — ABNORMAL HIGH (ref 70–99)
Glucose-Capillary: 105 mg/dL — ABNORMAL HIGH (ref 70–99)
Glucose-Capillary: 134 mg/dL — ABNORMAL HIGH (ref 70–99)
Glucose-Capillary: 111 mg/dL — ABNORMAL HIGH (ref 70–99)
Glucose-Capillary: 119 mg/dL — ABNORMAL HIGH (ref 70–99)

## 2021-12-26 LAB — BASIC METABOLIC PANEL
Anion gap: 4 — ABNORMAL LOW (ref 5–15)
BUN: 10 mg/dL (ref 6–20)
CO2: 27 mmol/L (ref 22–32)
Calcium: 8.7 mg/dL — ABNORMAL LOW (ref 8.9–10.3)
Chloride: 107 mmol/L (ref 98–111)
Creatinine, Ser: <0.3 mg/dL — ABNORMAL LOW (ref 0.61–1.24)
Glucose, Bld: 84 mg/dL (ref 70–99)
Potassium: 3.8 mmol/L (ref 3.5–5.1)
Sodium: 138 mmol/L (ref 135–145)

## 2021-12-26 MED ORDER — SENNOSIDES 8.8 MG/5ML PO SYRP
10.0000 mL | ORAL_SOLUTION | Freq: Two times a day (BID) | ORAL | Status: DC | PRN
  Administered 2021-12-26 – 2021-12-31 (×2): 10 mL

## 2021-12-27 DIAGNOSIS — A419 Sepsis, unspecified organism: Secondary | ICD-10-CM | POA: Diagnosis not present

## 2021-12-27 DIAGNOSIS — R6521 Severe sepsis with septic shock: Secondary | ICD-10-CM | POA: Diagnosis not present

## 2021-12-27 DIAGNOSIS — L8990 Pressure ulcer of unspecified site, unspecified stage: Secondary | ICD-10-CM | POA: Insufficient documentation

## 2021-12-27 LAB — GLUCOSE, CAPILLARY
Glucose-Capillary: 83 mg/dL (ref 70–99)
Glucose-Capillary: 90 mg/dL (ref 70–99)
Glucose-Capillary: 80 mg/dL (ref 70–99)
Glucose-Capillary: 121 mg/dL — ABNORMAL HIGH (ref 70–99)
Glucose-Capillary: 137 mg/dL — ABNORMAL HIGH (ref 70–99)
Glucose-Capillary: 131 mg/dL — ABNORMAL HIGH (ref 70–99)

## 2021-12-27 NOTE — Progress Notes (Signed)
PROGRESS NOTE    Jerry [name]  WGN:[id number] DOB: Nov 05, 1970 DOA: 12/09/2021 PCP: [name], MD    Brief Narrative:   Jerry [name] is a 51 y.o. male with past medical history significant for ALS presented to [facility] ED on 6/7 via EMS after being found by family altered, less responsive with oxygen saturation dropping to the 60s.  Patient is followed by [facility] ALS clinic and now ALS has progressed to the point in which he is wheelchair-bound; in addition to increased respiratory distress over the last few weeks.  Recently had PEG tube placement.  On arrival to the emergency department he was intubated immediately in which she had a brief cardiac arrest in the ED at the time of intubation with recurrent cardiac arrest in the ICU on day of admission.  Significant Hospital events: 12/09/2021 intubated. Reportedly brief code in the ED with rapid response RN. Arrived in the ICU. Became hypotensive despite vasopressor support. Lost pulse again and CPR performed with ROSC. Bronchoscopy with erythematous airways with scant thin secretions 6/12 s/p trach 6/14 failed ATC after 10 minutes 6/16 bronchoscopy> thick mucus removed from RLL; culture sent> pseudomonas 6/17 started inhaled tobi, daily azithro 6/20 transferred from [facility] to the hospitalist service 6/22 Remains stable on minimal vent setting, no issues overnight  6/23 Stable on minimal vent settings, awaiting LTACH vs home w/ HH if able to obtain 24h nursing support, discussed with Dr. Delton Coombes this am, continue abx w/ azithromycin and nebulized tobramycin until follows up with Dr. Kendrick Fries outpatient 6/24 remains stable, RT reports clogging of filters on vent tubing causing shortness of breath when utilizing tobramycin nebs; RT now reports changing filters after each neb treatment   Assessment & Plan:   Acute on chronic hypoxemic/hypercarbic respiratory failure 2/2 progressive neuromuscular disease/ALS Patient presenting to ED via EMS  after being found altered, with SPO2 in the 60s.  Etiology likely secondary to progressive neuromuscular disease with underlying ALS.  Patient was intubated on admission and likely now vent dependent given progression of ALS.  Patient underwent tracheostomy placement on 12/14/2021. --PCCM following, appreciate assistance --Pulmonary hygiene  Pseudomonas pneumonia Respiratory culture from bronchoscopy with pansensitive Pseudomonas. --Azithromycin 250 mg per tube daily --Inhaled tobramycin 300 mg every 12 hours --Per Dr. Solon Augusta and [facility] will continue antibiotics until outpatient follow-up  Progressive ALS Patient has been bedbound for the last year currently vent dependent.  Followed by [facility]. --Continue vent --Continue tube feeds via PEG tube --Pending LTAC placement vs home with home health if can obtain 24-hour nursing support --Peer to peer has been requested by patient's insurance company, called on 6/23 and left message for call back; will recall once again today   DVT prophylaxis: enoxaparin (LOVENOX) injection 40 mg Start: 12/21/21 1145    Code Status: Full Code Family Communication: No family present at bedside this morning  Disposition Plan:  Level of care: ICU Status is: Inpatient Remains inpatient appropriate because: [facility]ending [facility] placement; awaiting callback from insurance company for peer to peer; message left on 6/23    Consultants:  PCCM  Procedures:  Tracheostomy 6/12, Dr. Merrily Pew Bronchoscopy 6/12  Antimicrobials:  Azithromycin 6/17>> Inhaled tobramycin 6/17>> Zosyn 6/9 - 6/15 Unasyn 6/7 - 6/9   Subjective: Patient seen examined bedside, resting comfortably.  Spouse present.  Patient yesterday started experiencing shortness of breath, chest x-ray clear and remains on minimal vent settings with FiO2 30% and SPO2 100%.  There has been some issues with his tobramycin  nebulized treatments causing crystallization in the vent tubing  filters that are now being changed after each treatment.  Still awaiting callback from patient's insurance company for requested peer to peer.  No other questions or concerns at this time.  Denies headache, no chest pain, no abdominal pain.  No acute concerns overnight per nursing staff.    Objective: Vitals:   12/27/21 0400 12/27/21 0500 12/27/21 0600 12/27/21 0743  BP: 99/66 105/68 100/68   Pulse: 80 84 82 82  Resp: 20 18 20 20   Temp:    98.1 F (36.7 C)  TempSrc:    Axillary  SpO2: 98% 98% 100% 100%  Weight:  59.6 kg      Intake/Output Summary (Last 24 hours) at 12/27/2021 0954 Last data filed at 12/27/2021 0200 Gross per 24 hour  Intake 685 ml  Output 1100 ml  Net -415 ml    Filed Weights   12/20/21 0500 12/24/21 0500 12/27/21 0500  Weight: 47.6 kg 54.9 kg 59.6 kg    Examination:  Physical Exam: GEN: NAD, alert, chronically ill in appearance/cachectic; lying in bed on vent HEENT: NCAT, PERRL, EOMI, sclera clear, MMM, trach noted PULM: Breath sounds slightly diminished bilateral bases, no wheezing/crackles, normal respiratory effort with out accessory muscle use, on vent CV: RRR w/o M/G/R GI: abd soft, NTND, NABS, no R/G/M MSK: no peripheral edema Integumentary: dry/intact, no rashes or wounds    Data Reviewed: I have personally reviewed following labs and imaging studies  CBC: Recent Labs  Lab 12/22/21 0235 12/26/21 0314  WBC 4.4 3.9*  NEUTROABS  --  2.0  HGB 8.8* 8.5*  HCT 26.2* 25.7*  MCV 91.9 93.1  PLT 248 245   Basic Metabolic Panel: Recent Labs  Lab 12/22/21 0235 12/26/21 0314  NA 138 138  K 3.8 3.8  CL 109 107  CO2 24 27  GLUCOSE 91 84  BUN 9 10  CREATININE <0.30* <0.30*  CALCIUM 8.6* 8.7*   GFR: CrCl cannot be calculated (This lab value cannot be used to calculate CrCl because it is not a number: <0.30). Liver Function Tests: No results for input(s): "AST", "ALT", "ALKPHOS", "BILITOT", "PROT", "ALBUMIN" in the last 168 hours.  No  results for input(s): "LIPASE", "AMYLASE" in the last 168 hours. No results for input(s): "AMMONIA" in the last 168 hours. Coagulation Profile: No results for input(s): "INR", "PROTIME" in the last 168 hours. Cardiac Enzymes: No results for input(s): "CKTOTAL", "CKMB", "CKMBINDEX", "TROPONINI" in the last 168 hours. BNP (last 3 results) No results for input(s): "PROBNP" in the last 8760 hours. HbA1C: No results for input(s): "HGBA1C" in the last 72 hours. CBG: Recent Labs  Lab 12/26/21 1603 12/26/21 1911 12/26/21 2312 12/27/21 0312 12/27/21 0748  GLUCAP 134* 111* 119* 83 90   Lipid Profile: No results for input(s): "CHOL", "HDL", "LDLCALC", "TRIG", "CHOLHDL", "LDLDIRECT" in the last 72 hours. Thyroid Function Tests: No results for input(s): "TSH", "T4TOTAL", "FREET4", "T3FREE", "THYROIDAB" in the last 72 hours. Anemia Panel: No results for input(s): "VITAMINB12", "FOLATE", "FERRITIN", "TIBC", "IRON", "RETICCTPCT" in the last 72 hours. Sepsis Labs: No results for input(s): "PROCALCITON", "LATICACIDVEN" in the last 168 hours.  Recent Results (from the past 240 hour(s))  Culture, Respiratory w Gram Stain     Status: None   Collection Time: 12/18/21 11:19 AM   Specimen: Tracheal Aspirate; Respiratory  Result Value Ref Range Status   Specimen Description TRACHEAL ASPIRATE  Final   Special Requests NONE  Final   Gram Stain  Final    RARE WBC PRESENT,BOTH PMN AND MONONUCLEAR NO ORGANISMS SEEN Performed at [facility], [address] [place], [place] [address]    Culture FEW PSEUDOMONAS AERUGINOSA  Final   Report Status 12/20/2021 FINAL  Final   Organism ID, Bacteria PSEUDOMONAS AERUGINOSA  Final      Susceptibility   Pseudomonas aeruginosa - MIC*    CEFTAZIDIME >=64 RESISTANT Resistant     CIPROFLOXACIN <=0.25 SENSITIVE Sensitive     GENTAMICIN <=1 SENSITIVE Sensitive     IMIPENEM 1 SENSITIVE Sensitive     * FEW PSEUDOMONAS AERUGINOSA         Radiology  Studies: DG CHEST PORT 1 VIEW  Result Date: 12/26/2021 CLINICAL DATA:  Shortness of breath EXAM: PORTABLE CHEST 1 VIEW COMPARISON:  12/18/2021 FINDINGS: Unchanged AP portable examination. Tracheostomy. Right chest port catheter. No acute abnormality of the lungs. Heart and mediastinum are normal. IMPRESSION: Unchanged AP portable examination. Tracheostomy. No acute abnormality of the lungs. Electronically Signed   By: Jearld Lesch M.D.   On: 12/26/2021 13:56        Scheduled Meds:  azithromycin  250 mg Per Tube Daily   Chlorhexidine Gluconate Cloth  6 each Topical Daily   enoxaparin (LOVENOX) injection  40 mg Subcutaneous Q24H   feeding supplement (KATE FARMS STANDARD 1.4)  325 mL Per Tube 5 X Daily   free water  100 mL Per Tube 5 X Daily   guaiFENesin  15 mL Per Tube Q6H   heparin lock flush  500 Units Intracatheter Q30 days   melatonin  5 mg Per Tube QHS   mouth rinse  15 mL Mouth Rinse Q2H   pantoprazole sodium  40 mg Per Tube Daily   polyethylene glycol  17 g Per Tube Daily   scopolamine  1 patch Transdermal Q72H   sodium chloride flush  10-40 mL Intracatheter Q12H   tobramycin (PF)  300 mg Nebulization BID   Continuous Infusions:  sodium chloride       LOS: 18 days    Time spent: 48 minutes spent on chart review, discussion with nursing staff, consultants, updating family and interview/physical exam; more than 50% of that time was spent in counseling and/or coordination of care.    Alvira Philips [place], DO [facility] Available via Epic secure chat 7am-7pm After these hours, please refer to coverage provider listed on [email or web address] 12/27/2021, 9:54 AM

## 2021-12-27 NOTE — Plan of Care (Signed)

## 2021-12-28 DIAGNOSIS — J9612 Chronic respiratory failure with hypercapnia: Secondary | ICD-10-CM

## 2021-12-28 DIAGNOSIS — J9611 Chronic respiratory failure with hypoxia: Secondary | ICD-10-CM | POA: Diagnosis not present

## 2021-12-28 DIAGNOSIS — G1221 Amyotrophic lateral sclerosis: Secondary | ICD-10-CM | POA: Diagnosis not present

## 2021-12-28 DIAGNOSIS — R6521 Severe sepsis with septic shock: Secondary | ICD-10-CM | POA: Diagnosis not present

## 2021-12-28 DIAGNOSIS — A419 Sepsis, unspecified organism: Secondary | ICD-10-CM | POA: Diagnosis not present

## 2021-12-28 LAB — GLUCOSE, CAPILLARY
Glucose-Capillary: 83 mg/dL (ref 70–99)
Glucose-Capillary: 137 mg/dL — ABNORMAL HIGH (ref 70–99)
Glucose-Capillary: 131 mg/dL — ABNORMAL HIGH (ref 70–99)
Glucose-Capillary: 125 mg/dL — ABNORMAL HIGH (ref 70–99)
Glucose-Capillary: 112 mg/dL — ABNORMAL HIGH (ref 70–99)
Glucose-Capillary: 146 mg/dL — ABNORMAL HIGH (ref 70–99)

## 2021-12-28 MED ORDER — FREE WATER
50.0000 mL | Status: DC
  Administered 2021-12-28 – 2021-12-29 (×10): 50 mL

## 2021-12-28 MED ORDER — KATE FARMS STANDARD 1.4 PO LIQD
162.5000 mL | ORAL | Status: DC
  Administered 2021-12-28 – 2021-12-29 (×10): 162.5 mL
  Filled 2021-12-28 (×12): qty 325

## 2021-12-28 NOTE — Progress Notes (Signed)
NAME:  Jerry [name], MRN:  [id number], DOB:  1970-08-02, LOS: 19 ADMISSION DATE:  12/09/2021, CONSULTATION DATE:  6/7 REFERRING MD: Madilyn Hook EDP , CHIEF COMPLAINT:  Dyspnea   History of Present Illness:  51 y/o male with ALS admitted on 6/7 in the setting of acute hypoxemic respiratory failure requiring intubation and mechanical ventilation.  He had a brief cardiac arrest in the ER around the time of intubation. He had another cardiac arrest in the ICU on the day of admission.    Pertinent  Medical History  ALS COVID-19 Morbid obesity Prediabetes  Significant Hospital Events: Including procedures, antibiotic start and stop dates in addition to other pertinent events   12/09/2021 intubated. Reportedly brief code in the ED with rapid response RN. Arrived in the ICU. Became hypotensive despite vasopressor support. Lost pulse again and CPR performed with ROSC. Bronchoscopy with erythematous airways with scant thin secretions 6/12 s/p trach 6/14 failed ATC after 10 minutes 6/16 bronchoscopy> thick mucus removed from RLL; culture sent> pseudomonas 6/17 started inhaled tobi, daily azithro 6/21 Remains stable on minimal vent setting, no issues overnight   Interim History / Subjective:  Over the last few days. On tobramycin nebs, having occasional mucus plugging.  This morning feel chilly and a little uncomfortable in bed, but reports feeling comfortable on the ventilator and current settings Which are PRVC Rate at 20, Vt 290, Peep of 5.    Objective   Blood pressure 101/68, pulse 65, temperature 98.6 F (37 C), temperature source Oral, resp. rate 20, weight 60.3 kg, SpO2 100 %.    Vent Mode: PRVC FiO2 (%):  [30 %] 30 % Set Rate:  [20 bmp] 20 bmp Vt Set:  [490 mL] 490 mL PEEP:  [5 cmH20] 5 cmH20 Plateau Pressure:  [16 cmH20-17 cmH20] 16 cmH20   Intake/Output Summary (Last 24 hours) at 12/28/2021 1047 Last data filed at 12/28/2021 6213 Gross per 24 hour  Intake 850 ml  Output 535 ml  Net  315 ml   Filed Weights   12/24/21 0500 12/27/21 0500 12/28/21 0500  Weight: 54.9 kg 59.6 kg 60.3 kg    Examination: Gen:      Trach to vent, no distress, communicates with eye movements and computer Lungs:    sounds of mechanical ventilation auscultated, no wheezes or crackles CV:         RRR no mrg Abd:      + bowel sounds; soft, non-tender; no palpable masses, no distension Ext:   Thin,  No edema Skin:      Warm and dry; no rashes Neuro:   nonverbal, no extremity movement. Communicates with eye movement   Resolved Hospital Problem list   Septic shock Hyponatremia Hypokalemia Hypophosphatemia Ileus in setting of electrolyte imbalance  S/P in hospital PEA cardiac arrest due to respiratory failure Atelectasis, right lower lobe collapse  Assessment & Plan:  Acute on chronic hypoxemic and hypercarbic respiratory failure due to progressive neuromuscular disease  -Now s/p tracheostomy > failed ATC attempt on 6/14 Pneumonia due to pansensitive pseudomonas -Improved pneumonia but has persistently positive tracheobronchitis Thick pulmonary secretions, at risk for RLL collapse P: continue ventilator support with lung protective strategies  Wean PEEP and FiO2 for sats greater than 90%. Head of bed elevated 30 degrees. Plateau pressures less than 30 cm H20.  Follow intermittent chest x-ray and ABG.   SAT/SBT as tolerated, mentation preclude extubation  Ensure adequate pulmonary hygiene - noted that he is needing more frequent filter changes and this  will have to continue at home as well.  Follow cultures  VAP bundle in place  PAD protocol Continue Azithromycin and inhaled Tobramycin for colonization of pseudomonas  Guaifenesin Follow up pulmonary apt with Dr. Kendrick Fries 7/31 at 1400 Home vent upon discharge   Progressive ALS on NIV -Patient has been bedboud for the last year and is currently vent dependent. ALS management per [facility]  P: Supportive care   Will continue to  follow.   Durel Salts, MD Pulmonary and Critical Care Medicine [facility] 12/28/2021 10:48 AM Pager: see AMION  If no response to pager, please call critical care on call (see AMION) until 7pm After 7:00 pm call Elink

## 2021-12-29 DIAGNOSIS — A419 Sepsis, unspecified organism: Secondary | ICD-10-CM | POA: Diagnosis not present

## 2021-12-29 DIAGNOSIS — R6521 Severe sepsis with septic shock: Secondary | ICD-10-CM | POA: Diagnosis not present

## 2021-12-29 LAB — GLUCOSE, CAPILLARY
Glucose-Capillary: 98 mg/dL (ref 70–99)
Glucose-Capillary: 114 mg/dL — ABNORMAL HIGH (ref 70–99)
Glucose-Capillary: 78 mg/dL (ref 70–99)
Glucose-Capillary: 80 mg/dL (ref 70–99)
Glucose-Capillary: 94 mg/dL (ref 70–99)
Glucose-Capillary: 86 mg/dL (ref 70–99)

## 2021-12-29 MED ORDER — FREE WATER
100.0000 mL | Status: DC
Start: 2021-12-29 — End: 2022-01-08
  Administered 2021-12-29 – 2022-01-08 (×62): 100 mL

## 2021-12-29 MED ORDER — KATE FARMS STANDARD 1.4 PO LIQD
65.0000 mL/h | ORAL | Status: DC
  Administered 2021-12-29 – 2022-01-05 (×18): 65 mL/h
  Filled 2021-12-29 (×36): qty 325
  Filled 2021-12-29: qty 650
  Filled 2021-12-29 (×4): qty 325

## 2021-12-29 MED ORDER — TRAZODONE HCL 50 MG PO TABS
50.0000 mg | ORAL_TABLET | Freq: Every evening | ORAL | Status: DC | PRN
  Administered 2021-12-29: 50 mg
  Filled 2021-12-29: qty 1

## 2021-12-29 NOTE — Progress Notes (Signed)
Nutrition Follow-up  DOCUMENTATION CODES:   Underweight  INTERVENTION:   Transition to continuous tube feeding regimen via PEG: Jerry [name] Standard 1.4 @ 65 ml/hr (1560 ml/day = 5 cartons) - Free water flushes of 100 ml q 4 hours  Continuous tube feeding regimen provides 2184 kcal, 96 grams of protein, and 1109 ml of H2O (meets >100% of estimated kcal and protein needs).  Total free water with flushes: 1709 ml  NUTRITION DIAGNOSIS:   Inadequate oral intake related to inability to eat as evidenced by NPO status.  Ongoing, being addressed via TF  GOAL:   Patient will meet greater than or equal to 90% of their needs  Met via TF  MONITOR:   Vent status, Labs, Weight trends, TF tolerance  REASON FOR ASSESSMENT:   Ventilator, Consult Enteral/tube feeding initiation and management  ASSESSMENT:   51 year old male who presented to the ED on 6/07 with AMS. PMH of ALS which has progressed since last year on home NIV, wheelchair-bound at baseline. Pt admitted with septic shock due to bilateral multifocal PNA, ileus.  06/07 - Code Blue, s/p bronchoscopy 06/08 - tube feeds initiated via PEG 06/12 - s/p tracheostomy  Discussed pt with RN and during ICU rounds. RN informed RD that pt had been having issues with bloating and abdominal discomfort with bolus tube feeding regimen. RN had been giving half of bolus at one time then giving other half an hour later. Pharmacy had adjusted TF orders yesterday to where pt would be receiving half a carton (162.5 ml) q 2 hours including overnight. RN reports that this really disrupted pt's sleep and that he is still having issues with bloating. RN requesting RD assess for potential to transition to continuous feeds.  Discussed with MD who was in agreement with plan to transition to continuous tube feeds via PEG. Spoke with pt's wife via phone call. Explained purpose of transitioning to continuous feeds and wife in agreement. Orders  adjusted.  Pt pending d/c to LTACH once bed becomes available.  Admit weight: 42.6 kg Current weight: 60.3 kg  Pt with non-pitting edema to BLE.  Patient remains on ventilator support via trach MV: 9.6 L/min Temp (24hrs), Avg:98.8 F (37.1 C), Min:98.1 F (36.7 C), Max:99.2 F (37.3 C)  Medications reviewed and include: melatonin, protonix, miralax, scopolamine patch  Labs reviewed. CBG's: 78-146 x 24 hours  UOP: 1650 ml x 24 hours I/O's: +9.3 L since admit  Diet Order:   Diet Order             Diet NPO time specified  Diet effective now                   EDUCATION NEEDS:   No education needs have been identified at this time  Skin:  Skin Assessment: Skin Integrity Issues: Stage I: R ear Other: skin tear bilateral buttocks  Last BM:  12/28/21 smear type 5  Height:   Ht Readings from Last 1 Encounters:  11/18/21 5\' 5"  (1.651 m)    Weight:   Wt Readings from Last 1 Encounters:  12/28/21 60.3 kg    BMI:  Body mass index is 22.12 kg/m.  Estimated Nutritional Needs:   Kcal:  1700-1900  Protein:  80-95 grams  Fluid:  1.7-1.9 L    Mertie Clause, MS, RD, LDN Inpatient Clinical Dietitian Please see AMiON for contact information.

## 2021-12-30 DIAGNOSIS — E871 Hypo-osmolality and hyponatremia: Secondary | ICD-10-CM | POA: Diagnosis not present

## 2021-12-30 DIAGNOSIS — I469 Cardiac arrest, cause unspecified: Secondary | ICD-10-CM | POA: Diagnosis not present

## 2021-12-30 DIAGNOSIS — F419 Anxiety disorder, unspecified: Secondary | ICD-10-CM | POA: Diagnosis not present

## 2021-12-30 DIAGNOSIS — G1221 Amyotrophic lateral sclerosis: Secondary | ICD-10-CM | POA: Diagnosis not present

## 2021-12-30 DIAGNOSIS — Z515 Encounter for palliative care: Secondary | ICD-10-CM | POA: Diagnosis not present

## 2021-12-30 DIAGNOSIS — A419 Sepsis, unspecified organism: Secondary | ICD-10-CM | POA: Diagnosis not present

## 2021-12-30 LAB — GLUCOSE, CAPILLARY
Glucose-Capillary: 104 mg/dL — ABNORMAL HIGH (ref 70–99)
Glucose-Capillary: 105 mg/dL — ABNORMAL HIGH (ref 70–99)
Glucose-Capillary: 107 mg/dL — ABNORMAL HIGH (ref 70–99)
Glucose-Capillary: 119 mg/dL — ABNORMAL HIGH (ref 70–99)
Glucose-Capillary: 131 mg/dL — ABNORMAL HIGH (ref 70–99)
Glucose-Capillary: 91 mg/dL (ref 70–99)

## 2021-12-30 NOTE — Progress Notes (Signed)
HME changed.

## 2021-12-30 NOTE — Progress Notes (Signed)
SLP Cancellation Note  Patient Details Name: Jerry [name] MRN: [id number] DOB: December 19, 1970   Cancelled treatment:       Reason Eval/Treat Not Completed: Fatigue/lethargy limiting ability to participate> Checked in on pt. He was sleeping and did not immediately waken to his name. No family at bedside to chat with with inline PMSV. Talked to RN and reinforced that RT and RN can place valve if he would like to wear it later. Will f/u tomorrow.    Nolyn Eilert, Katherene Ponto 12/30/2021, 10:37 AM

## 2021-12-30 NOTE — TOC Progression Note (Signed)
Transition of Care [facility]) - Initial/Assessment Note    Patient Details  Name: Jerry [name] MRN: [id number] Date of Birth: [birth year]  Transition [facility]) CM/SW Contact:    Jerry [name], LCSWA Phone Number: 12/30/2021, 10:02 AM  Clinical Narrative:                 CSW contacted [name].  The facility does not have a bed available today.  Pending:  Bed availability at [facility]  Expected Discharge Plan: [place] Barriers to Discharge: Continued Medical Work up   Patient Goals and CMS Choice Patient states their goals for this hospitalization and ongoing recovery are:: To return home CMS Medicare.gov Compare Post Acute Care list provided to:: Patient Represenative (must comment) (Wife, Jerry [name]) Choice offered to / list presented to : Patient, Spouse  Expected Discharge Plan and Services Expected Discharge Plan: [place]   Discharge Planning Services: CM Consult Post Acute Care Choice: Home Health, Durable Medical Equipment Living arrangements for the past 2 months: Single Family Home                 DME Arranged: Oxygen ([employer]) DME Agency: [employer] Date DME Agency Contacted: 12/15/21 Time DME Agency Contacted: [phone] Representative spoke with at DME Agency: Jerry [name]            Prior Living Arrangements/Services Living arrangements for the past 2 months: [place] Lives with:: Adult Children, Spouse Patient language and need for interpreter reviewed:: Yes Do you feel safe going back to the place where you live?: Yes      Need for Family Participation in Patient Care: Yes (Comment) Care giver support system in place?: Yes (comment) Current home services: DME Criminal Activity/Legal Involvement Pertinent to Current Situation/Hospitalization: No - Comment as needed  Activities of Daily Living   ADL Screening (condition at time of admission) Is the patient deaf or have difficulty hearing?: No Does the  patient have difficulty seeing, even when wearing glasses/contacts?: No Does the patient have difficulty concentrating, remembering, or making decisions?: No Patient able to express need for assistance with ADLs?: Yes Does the patient have difficulty dressing or bathing?: Yes Independently performs ADLs?: No Does the patient have difficulty walking or climbing stairs?: Yes Weakness of Legs: Both Weakness of Arms/Hands: Both  Permission Sought/Granted Permission sought to share information with : Case Manager, Customer service manager, Family Supports Permission granted to share information with : Yes, Verbal Permission Granted              Emotional Assessment Appearance:: Appears stated age Attitude/Demeanor/Rapport: Intubated (Following Commands or Not Following Commands) Affect (typically observed): Unable to Assess   Alcohol / Substance Use: Not Applicable Psych Involvement: No (comment)  Admission diagnosis:  Respiratory arrest ([place]) [R09.2] Hyponatremia [E87.1] Ileus ([place]) [K56.7] Septic shock ([place]) [A41.9, R65.21] Patient Active Problem List   Diagnosis Date Noted   Pressure injury of skin 12/27/2021   Cardiac arrest ([place])    Acute hypoxemic respiratory failure (HCC)    Septic shock ([place]) 12/09/2021   Anxiety 11/18/2021   Bed sore on buttock, right, unstageable ([place]) 11/18/2021   Severe protein-calorie malnutrition ([place]) 11/13/2021   Microscopic hematuria 10/29/2021   S/P percutaneous endoscopic gastrostomy (PEG) tube placement ([place]) 10/29/2021   ALS (amyotrophic lateral sclerosis) ([place]) 01/11/2019   PCP:  [name], MD Pharmacy:   [place], [address]  [place] [address] Phone: [phone] Fax: [phone]     Social Determinants of Health (SDOH) Interventions    Readmission Risk Interventions     No data to display

## 2021-12-30 NOTE — Progress Notes (Signed)
Patient ID: [name], male   DOB: 1970-10-31, 51 y.o.   MRN: [id number]    Progress Note from the Palliative Medicine Team at [facility]   Patient Name: Jerry [name]        Date: 12/30/2021 DOB: Apr 19, 1971  Age: 51 y.o. MRN#: [id number] Attending Physician: Donne Hazel, MD Primary Care Physician: [name], MD Admit Date: 12/09/2021   Medical records reviewed  51 y.o. male   admitted on 12/09/2021 with ALS admitted on 6/7 in the setting of acute hypoxemic respiratory failure requiring intubation and mechanical ventilation.  He had a brief cardiac arrest in the ER around the time of intubation. He had another cardiac arrest in the ICU on the day of admission.     12/09/2021 intubated. Reportedly brief code in the ED with rapid response RN. Arrived in the ICU. Became hypotensive despite vasopressor support. Lost pulse again and CPR performed with ROSC. Bronchoscopy with erythematous airways with scant thin secretions 6/12 s/p trach 6/14 failed ATC after 10 minutes 6/16 bronchoscopy> thick mucus removed from RLL; culture sent> pseudomonas 6/20 remains intubated, stabilized and decisions for transition of care pending Remains stable on minimal vent settings   Patient and family face treatment option decisions, advanced directive decisions and anticipatory care needs.   This NP visited patient at the bedside as a follow up for palliative medicine needs and emotional support.  Patient is awake and alert, family member at bedside.  Wife is not present.  Patient with complaint of oral and throat secretions.,  suction successful.  Phone conversation/collaboration had with bedside nursing and Gerald Leitz NP, CCM regarding overall treatment plan moving forward.  Family in process and working with transition of care team and decision regarding disposition to home versus LTAC  PMT will continue to support holistically  Questions and concerns addressed   Discussed with treatment  team  This nurse practitioner informed  the patient and the attending that I will be out of the hospital until Monday morning.  If the patient is still hospitalized I will follow-up at that time.  Call palliative medicine team phone # [phone] with questions or concerns in the interim   Wadie Lessen NP  Palliative Medicine Team Team Phone # [phone] Pager [phone]

## 2021-12-30 NOTE — Consult Note (Signed)
   [facility] Inpatient Consult   12/30/2021  [name] 03-23-1971 [id number]  [place] Organization [ACO] Patient: Jerry [name] SNP  Primary Care Provider: [name], MD is with [facility] an Embedded provider and patient has been outreached by the Chronic Care Management team as well.    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 Patient is also in   Of note, [facility] Care Management services does not replace or interfere with any services that are arranged by inpatient case management or social work.  For additional questions or referrals please contact:    Plan: To update CCM team of progress and needs as appropriate for disposition needs.  Natividad Brood, RN BSN [facility] Liaison  [phone] business mobile phone Toll free office [phone]  Fax number: [phone] [email or web address]

## 2021-12-30 NOTE — Progress Notes (Signed)
Progress Note   Patient: Jerry [name] OIZ:[id number] DOB: 1971-04-16 DOA: 12/09/2021     21 DOS: the patient was seen and examined on 12/30/2021   Brief hospital course: 51 y.o. male with past medical history significant for ALS presented to [facility] ED on 6/7 via EMS after being found by family altered, less responsive with oxygen saturation dropping to the 60s.  Patient is followed by [place] clinic and now ALS has progressed to the point in which he is wheelchair-bound; in addition to increased respiratory distress over the last few weeks.  Recently had PEG tube placement.  On arrival to the emergency department he was intubated immediately in which she had a brief cardiac arrest in the ED at the time of intubation with recurrent cardiac arrest in the ICU on day of admission.   Significant Hospital events: 12/09/2021 intubated. Reportedly brief code in the ED with rapid response RN. Arrived in the ICU. Became hypotensive despite vasopressor support. Lost pulse again and CPR performed with ROSC. Bronchoscopy with erythematous airways with scant thin secretions 6/12 s/p trach 6/14 failed ATC after 10 minutes 6/16 bronchoscopy> thick mucus removed from RLL; culture sent> pseudomonas 6/17 started inhaled tobi, daily azithro 6/20 transferred from [facility] to the hospitalist service 6/22 Remains stable on minimal vent setting, no issues overnight  6/23 Stable on minimal vent settings, awaiting LTACH vs home w/ HH if able to obtain 24h nursing support, discussed with Dr. Lamonte Sakai this am, continue abx w/ azithromycin and nebulized tobramycin until follows up with Dr. [name] outpatient 6/24 remains stable, RT reports clogging of filters on vent tubing causing shortness of breath when utilizing tobramycin nebs; RT now reports changing filters after each neb treatment 6/26: peer to peer completed; approved [name]; bed pending  Assessment and Plan: Acute on chronic hypoxemic/hypercarbic  respiratory failure 2/2 progressive neuromuscular disease/ALS Patient presenting to ED via EMS after being found altered, with SPO2 in the 60s.  Etiology likely secondary to progressive neuromuscular disease with underlying ALS.  Patient was intubated on admission and likely now vent dependent given progression of ALS.  Patient underwent tracheostomy placement on 12/14/2021. --PCCM following, appreciate assistance --Continue with Pulmonary hygiene   Pseudomonas pneumonia Respiratory culture from bronchoscopy with pansensitive Pseudomonas. --Azithromycin 250 mg per tube daily --Inhaled tobramycin 300 mg every 12 hours (needs vent filter change after each dose due to crystallization within the filter) --Per Dr. Malvin Johns and [facility] will continue antibiotics until outpatient follow-up which is scheduled on 02/01/2022 -Afebrile   Progressive ALS Patient has been bedbound for the last year currently vent dependent.  Followed by [place] [facility]. --Continue vent --Continue tube feeds via PEG tube --P[facility] to [facility], received approval from peer to peer on 6/26   Autonomic dysfunction Etiology likely secondary to his underlying progressive ALS.  Patient with mild hypotension, but MAP remain >65.  If maps fall below 65, can consider initiation of midodrine but will hold off on this for now as patient has no other endorgan damage and mentating appropriately.   Insomnia:  -Continue with trazodone 50 mg p.o. nightly      Subjective: Without complaints  Physical Exam: Vitals:   12/30/21 1200 12/30/21 1400 12/30/21 1600 12/30/21 1616  BP: 100/70     Pulse: 80 83 73 69  Resp: 20 20 (!) 23 20  Temp:      TempSrc:      SpO2: 100% 100% 100% 100%  Weight:  General exam: Awake, laying in bed, in nad Respiratory system: Normal respiratory effort, no wheezing Cardiovascular system: regular rate, s1, s2 Gastrointestinal system: Soft, nondistended, positive BS Central  nervous system: CN2-12 grossly intact, strength intact Extremities: Perfused, no clubbing Skin: Normal skin turgor, no notable skin lesions seen Psychiatry: Mood normal // no visual hallucinations   Data Reviewed:  Labs reviewed: Na 138, Cr <0.3, Hgb 8.5, WBC 3.9  Family Communication: Pt in room, family not at bedside  Disposition: Status is: Inpatient Remains inpatient appropriate because: Severity of illness  Planned Discharge Destination: LTAC     Author: Marylu Lund, MD 12/30/2021 6:11 PM  For on call review [email or web address].

## 2021-12-30 NOTE — TOC Progression Note (Signed)
Transition of Care [facility]) - Initial/Assessment Note    Patient Details  Name: Jerry [name] MRN: [id number] Date of Birth: 1970/07/23  Transition of Care [facility]) CM/SW Contact:    Milinda Antis, [place] Phone Number: 12/30/2021, 9:52 AM  Clinical Narrative:                 Late Entry  CSW contacted [facility] with [place].  The facility does not have any beds available at this time.  Insurance authorization has been approved.    Pending:  Bed at [facility]  Expected Discharge Plan: [place] Barriers to Discharge: Continued Medical Work up   Patient Goals and CMS Choice Patient states their goals for this hospitalization and ongoing recovery are:: To return home CMS Medicare.gov Compare Post Acute Care list provided to:: Patient Represenative (must comment) (Wife, [name]) Choice offered to / list presented to : Patient, Spouse  Expected Discharge Plan and Services Expected Discharge Plan: [place]   Discharge Planning Services: CM Consult Post Acute Care Choice: Home Health, Durable Medical Equipment Living arrangements for the past 2 months: Single Family Home                 DME Arranged: Oxygen ([employer]) DME Agency: [employer] Date DME Agency Contacted: 12/15/21 Time DME Agency Contacted: [phone] Representative spoke with at DME Agency: Thedore Mins            Prior Living Arrangements/Services Living arrangements for the past 2 months: [place] Lives with:: Adult Children, Spouse Patient language and need for interpreter reviewed:: Yes Do you feel safe going back to the place where you live?: Yes      Need for Family Participation in Patient Care: Yes (Comment) Care giver support system in place?: Yes (comment) Current home services: DME Criminal Activity/Legal Involvement Pertinent to Current Situation/Hospitalization: No - Comment as needed  Activities of Daily Living   ADL Screening (condition at time of admission) Is the  patient deaf or have difficulty hearing?: No Does the patient have difficulty seeing, even when wearing glasses/contacts?: No Does the patient have difficulty concentrating, remembering, or making decisions?: No Patient able to express need for assistance with ADLs?: Yes Does the patient have difficulty dressing or bathing?: Yes Independently performs ADLs?: No Does the patient have difficulty walking or climbing stairs?: Yes Weakness of Legs: Both Weakness of Arms/Hands: Both  Permission Sought/Granted Permission sought to share information with : Case Manager, Customer service manager, Family Supports Permission granted to share information with : Yes, Verbal Permission Granted              Emotional Assessment Appearance:: Appears stated age Attitude/Demeanor/Rapport: Intubated (Following Commands or Not Following Commands) Affect (typically observed): Unable to Assess   Alcohol / Substance Use: Not Applicable Psych Involvement: No (comment)  Admission diagnosis:  Respiratory arrest ([place]) [R09.2] Hyponatremia [E87.1] Ileus ([place]) [K56.7] Septic shock ([place]) [A41.9, R65.21] Patient Active Problem List   Diagnosis Date Noted   Pressure injury of skin 12/27/2021   Cardiac arrest ([place])    Acute hypoxemic respiratory failure ([place])    Septic shock ([place]) 12/09/2021   Anxiety 11/18/2021   Bed sore on buttock, right, unstageable ([place]) 11/18/2021   Severe protein-calorie malnutrition ([place]) 11/13/2021   Microscopic hematuria 10/29/2021   S/P percutaneous endoscopic gastrostomy (PEG) tube placement ([place]) 10/29/2021   ALS (amyotrophic lateral sclerosis) ([place]) 01/11/2019   PCP:  [name], MD Pharmacy:   [facility] [id number],  [place] [address] [place] [address] Phone: [phone] Fax: [phone]     Social Determinants of Health (SDOH) Interventions    Readmission  Risk Interventions     No data to display

## 2021-12-30 NOTE — Hospital Course (Signed)
51 y.o. male with past medical history significant for ALS presented to [facility] ED on 6/7 via EMS after being found by family altered, less responsive with oxygen saturation dropping to the 60s.  Patient is followed by [facility] and now ALS has progressed to the point in which he is wheelchair-bound; in addition to increased respiratory distress over the last few weeks.  Recently had PEG tube placement.  On arrival to the emergency department he was intubated immediately in which she had a brief cardiac arrest in the ED at the time of intubation with recurrent cardiac arrest in the ICU on day of admission.   Significant Hospital events: 12/09/2021 intubated. Reportedly brief code in the ED with rapid response RN. Arrived in the ICU. Became hypotensive despite vasopressor support. Lost pulse again and CPR performed with ROSC. Bronchoscopy with erythematous airways with scant thin secretions 6/12 s/p trach 6/14 failed ATC after 10 minutes 6/16 bronchoscopy> thick mucus removed from RLL; culture sent> pseudomonas 6/17 started inhaled tobi, daily azithro 6/20 transferred from [facility] to the hospitalist service 6/22 Remains stable on minimal vent setting, no issues overnight  6/23 Stable on minimal vent settings, awaiting LTACH vs home w/ HH if able to obtain 24h nursing support, discussed with Dr. Lamonte Sakai this am, continue abx w/ azithromycin and nebulized tobramycin until follows up with Dr. [name] outpatient 6/24 remains stable, RT reports clogging of filters on vent tubing causing shortness of breath when utilizing tobramycin nebs; RT now reports changing filters after each neb treatment 6/26: peer to peer completed; approved for [name]; bed pending

## 2021-12-31 ENCOUNTER — Ambulatory Visit: Payer: Self-pay

## 2021-12-31 DIAGNOSIS — G1221 Amyotrophic lateral sclerosis: Secondary | ICD-10-CM

## 2021-12-31 DIAGNOSIS — A419 Sepsis, unspecified organism: Secondary | ICD-10-CM | POA: Diagnosis not present

## 2021-12-31 DIAGNOSIS — Z515 Encounter for palliative care: Secondary | ICD-10-CM | POA: Diagnosis not present

## 2021-12-31 DIAGNOSIS — J9601 Acute respiratory failure with hypoxia: Secondary | ICD-10-CM | POA: Diagnosis not present

## 2021-12-31 DIAGNOSIS — I469 Cardiac arrest, cause unspecified: Secondary | ICD-10-CM | POA: Diagnosis not present

## 2021-12-31 DIAGNOSIS — F419 Anxiety disorder, unspecified: Secondary | ICD-10-CM | POA: Diagnosis not present

## 2021-12-31 DIAGNOSIS — R6521 Severe sepsis with septic shock: Secondary | ICD-10-CM | POA: Diagnosis not present

## 2021-12-31 LAB — COMPREHENSIVE METABOLIC PANEL
ALT: 20 U/L (ref 0–44)
AST: 12 U/L — ABNORMAL LOW (ref 15–41)
Albumin: 2.5 g/dL — ABNORMAL LOW (ref 3.5–5.0)
Alkaline Phosphatase: 58 U/L (ref 38–126)
Anion gap: 5 (ref 5–15)
BUN: 9 mg/dL (ref 6–20)
CO2: 24 mmol/L (ref 22–32)
Calcium: 8.6 mg/dL — ABNORMAL LOW (ref 8.9–10.3)
Chloride: 112 mmol/L — ABNORMAL HIGH (ref 98–111)
Creatinine, Ser: <0.3 mg/dL — ABNORMAL LOW (ref 0.61–1.24)
Glucose, Bld: 116 mg/dL — ABNORMAL HIGH (ref 70–99)
Potassium: 3.8 mmol/L (ref 3.5–5.1)
Sodium: 141 mmol/L (ref 135–145)
Total Bilirubin: 0.4 mg/dL (ref 0.3–1.2)
Total Protein: 5.4 g/dL — ABNORMAL LOW (ref 6.5–8.1)

## 2021-12-31 LAB — CBC
HCT: 26.4 % — ABNORMAL LOW (ref 39.0–52.0)
Hemoglobin: 8.5 g/dL — ABNORMAL LOW (ref 13.0–17.0)
MCH: 30.5 pg (ref 26.0–34.0)
MCHC: 32.2 g/dL (ref 30.0–36.0)
MCV: 94.6 fL (ref 80.0–100.0)
Platelets: 241 10*3/uL (ref 150–400)
RBC: 2.79 MIL/uL — ABNORMAL LOW (ref 4.22–5.81)
RDW: 15.3 % (ref 11.5–15.5)
WBC: 5.3 10*3/uL (ref 4.0–10.5)
nRBC: 0 % (ref 0.0–0.2)

## 2021-12-31 LAB — GLUCOSE, CAPILLARY
Glucose-Capillary: 101 mg/dL — ABNORMAL HIGH (ref 70–99)
Glucose-Capillary: 120 mg/dL — ABNORMAL HIGH (ref 70–99)
Glucose-Capillary: 109 mg/dL — ABNORMAL HIGH (ref 70–99)
Glucose-Capillary: 106 mg/dL — ABNORMAL HIGH (ref 70–99)
Glucose-Capillary: 85 mg/dL (ref 70–99)
Glucose-Capillary: 104 mg/dL — ABNORMAL HIGH (ref 70–99)

## 2021-12-31 MED ORDER — HYDROXYZINE HCL 25 MG PO TABS: 25.0000 mg | ORAL_TABLET | Freq: Two times a day (BID) | ORAL | Status: DC | PRN

## 2021-12-31 MED ORDER — HYDROXYZINE HCL 25 MG PO TABS
50.0000 mg | ORAL_TABLET | Freq: Every day | ORAL | Status: DC
  Administered 2021-12-31 – 2022-01-07 (×8): 50 mg
  Filled 2021-12-31 (×8): qty 2

## 2021-12-31 NOTE — Progress Notes (Signed)
Physical Therapy Treatment and Discharge Patient Details Name: Jerry [name] MRN: [id number] DOB: September 11, 1970 Today's Date: 12/31/2021   History of Present Illness 51 yo male admitted 6/7 with progressive respiratory decline requiring urgent intubation. Brief code in ED s/p CPR 2 min. Trach and bronch 6/12. PMhx: ALS (Dx 2020), pre diabetes    PT Comments    Patient seen and asking for his legs to be moved due to discomfort. Goal assessment completed (prior goals set for 2 weeks). Patient's wife has not been present for education on rolling (goal #1), however ?appropriateness of this goal as wife was caring for pt at home PTA (with pt being nearly bedbound at that time). Goals #2 and 3 no longer appropriate as pt will require mechanical lift for any OOB activities (for patient and caregiver safety). No further skilled PT needs noted at this time and patient is discharged from Physical Therapy.      Recommendations for follow up therapy are one component of a multi-disciplinary discharge planning process, led by the attending physician.  Recommendations may be updated based on patient status, additional functional criteria and insurance authorization.  Follow Up Recommendations  PT at Long-term acute care hospital     Assistance Recommended at Discharge Frequent or constant Supervision/Assistance  Patient can return home with the following Two people to help with walking and/or transfers;Two people to help with bathing/dressing/bathroom;Direct supervision/assist for medications management;Assistance with feeding;Assist for transportation;Help with stairs or ramp for entrance   Equipment Recommendations  Other (comment) (pressure overlay for hospital bed, lift equipment)    Recommendations for Other Services       Precautions / Restrictions Precautions Precautions: Fall;Other (comment) Precaution Comments: trach, vent, visual communication device, peg     Mobility  Bed Mobility                General bed mobility comments: No family present for education related to positioning or rolling and unclear that family will need this education as pt was being cared for at home PTA at a nearly bedbound level    Transfers                        Ambulation/Gait                   Stairs             Wheelchair Mobility    Modified Rankin (Stroke Patients Only)       Balance                                            Cognition Arousal/Alertness: Awake/alert Behavior During Therapy: WFL for tasks assessed/performed Overall Cognitive Status: Within Functional Limits for tasks assessed                                          Exercises Other Exercises Other Exercises: Patient requested PROM BLE, joint distraction & approximation BLE (Nurse tech had just completed bil UEs on arrival)    General Comments General comments (skin integrity, edema, etc.): Patient noted to be accepted for [facility], however no beds yet available.      Pertinent Vitals/Pain Pain Assessment Pain Assessment: Faces    Home Living  Prior Function            PT Goals (current goals can now be found in the care plan section) Acute Rehab PT Goals Patient Stated Goal: not stated Time For Goal Achievement: 12/29/21 Potential to Achieve Goals: Fair Progress towards PT goals: Not progressing toward goals - comment    Frequency           PT Plan Current plan remains appropriate    Co-evaluation              AM-PAC PT "6 Clicks" Mobility   Outcome Measure  Help needed turning from your back to your side while in a flat bed without using bedrails?: Total Help needed moving from lying on your back to sitting on the side of a flat bed without using bedrails?: Total Help needed moving to and from a bed to a chair (including a wheelchair)?: Total Help needed standing up from a  chair using your arms (e.g., wheelchair or bedside chair)?: Total Help needed to walk in hospital room?: Total Help needed climbing 3-5 steps with a railing? : Total 6 Click Score: 6    End of Session Equipment Utilized During Treatment: Oxygen;Other (comment) (vent) Activity Tolerance: Patient tolerated treatment well Patient left: in bed;with call bell/phone within reach;Other (comment) (augmentive communication device in position)   PT Visit Diagnosis: Other abnormalities of gait and mobility (R26.89)     Time: 4097-3532 PT Time Calculation (min) (ACUTE ONLY): 17 min  Charges:  $Therapeutic Exercise: 8-22 mins                      Arby Barrette, PT Acute Rehabilitation Services  Office [phone]    Rexanne Mano 12/31/2021, 4:33 PM

## 2021-12-31 NOTE — Chronic Care Management (AMB) (Signed)
Care Management    RN Visit Note  12/31/2021 Name: Jerry [name] MRN: [id number] DOB: 1971-02-05  Subjective: Jerry [name] is a 51 y.o. year old male who is a primary care patient of Jerry Sizer, MD. The care management team was consulted for assistance with disease management and care coordination needs.    Engaged with patient's spouse by telephone for follow up visit in response to provider referral for case management and care coordination services.   Consent to Services:   Jerry [name] was given information about Care Management services including:  Care Management services includes personalized support from designated clinical staff supervised by his physician, including individualized plan of care and coordination with other care providers 24/7 contact phone numbers for assistance for urgent and routine care needs. The patient may stop case management services at any time by phone call to the office staff.  Patient agreed to services and consent obtained.   Assessment: Review of patient past medical history, allergies, medications, health status, including review of consultants reports, laboratory and other test data, was performed as part of comprehensive evaluation and provision of chronic care management services.   SDOH (Social Determinants of Health) assessments and interventions performed: No  Care Plan  Allergies  Allergen Reactions   Crab [Shellfish Allergy] Rash    Facility-Administered Encounter Medications as of 12/31/2021  Medication   0.9 %  sodium chloride infusion   acetaminophen (TYLENOL) tablet 650 mg   alum & mag hydroxide-simeth (MAALOX/MYLANTA) 200-200-20 MG/5ML suspension 30 mL   azithromycin (ZITHROMAX) tablet 250 mg   baclofen (LIORESAL) tablet 5 mg   bisacodyl (DULCOLAX) suppository 10 mg   Chlorhexidine Gluconate Cloth 2 % PADS 6 each   diphenhydrAMINE (BENADRYL) 12.5 MG/5ML elixir 12.5 mg   enoxaparin (LOVENOX) injection 40 mg   feeding  supplement (KATE FARMS STANDARD 1.4) liquid   free water 100 mL   guaiFENesin (ROBITUSSIN) 100 MG/5ML liquid 15 mL   heparin lock flush 100 unit/mL   And   heparin lock flush 100 unit/mL   hydrOXYzine (ATARAX) tablet 25 mg   hydrOXYzine (ATARAX) tablet 50 mg   lip balm (CARMEX) ointment   melatonin tablet 5 mg   Oral care mouth rinse   pantoprazole sodium (PROTONIX) 40 mg/20 mL oral suspension 40 mg   polyethylene glycol (MIRALAX / GLYCOLAX) packet 17 g   polyvinyl alcohol (LIQUIFILM TEARS) 1.4 % ophthalmic solution 1 drop   scopolamine (TRANSDERM-SCOP) 1 MG/3DAYS 1.5 mg   sennosides (SENOKOT) 8.8 MG/5ML syrup 10 mL   sodium chloride (OCEAN) 0.65 % nasal spray 1 spray   sodium chloride flush (NS) 0.9 % injection 10-40 mL   sodium chloride flush (NS) 0.9 % injection 10-40 mL   tobramycin (PF) (TOBI) nebulizer solution 300 mg   Outpatient Encounter Medications as of 12/31/2021  Medication Sig   bisacodyl (DULCOLAX) 10 MG suppository Place 10 mg rectally daily as needed for severe constipation.   EPINEPHrine (EPIPEN 2-PAK) 0.3 mg/0.3 mL IJ SOAJ injection Inject 0.3 mLs (0.3 mg total) into the muscle as needed for anaphylaxis.   hydrOXYzine (ATARAX) 10 MG/5ML syrup Place 5-10 mLs (10-20 mg total) into feeding tube at bedtime. (Patient taking differently: Place 10-20 mg into feeding tube at bedtime as needed (SLEEP).)   LORazepam (LORAZEPAM INTENSOL) 2 MG/ML concentrated solution Take 0.5 mLs (1 mg total) by mouth daily as needed for anxiety.   Morphine Sulfate (MORPHINE CONCENTRATE) 10 mg / 0.5 ml concentrated solution Take 0.25-0.5 mLs by mouth every 4 (  four) hours as needed.   polyethylene glycol powder (GLYCOLAX/MIRALAX) 17 GM/SCOOP powder Place 17 g into feeding tube daily as needed for constipation.   Sennosides (SENNA) 8.8 MG/5ML LIQD Take 10 mLs by mouth daily as needed for constipation.    Patient Active Problem List   Diagnosis Date Noted   Pressure injury of skin 12/27/2021    Cardiac arrest [facility])    Acute hypoxemic respiratory failure (HCC)    Septic shock (HCC) 12/09/2021   Anxiety 11/18/2021   Bed sore on buttock, right, unstageable (Jerry [name]) 11/18/2021   Severe protein-calorie malnutrition ([place]) 11/13/2021   Microscopic hematuria 10/29/2021   S/P percutaneous endoscopic gastrostomy (PEG) tube placement ([place]) 10/29/2021   ALS (amyotrophic lateral sclerosis) (Jerry [name]) 01/11/2019     Patient Care Plan: Care Coordination     Problem Identified: ALS      Goal: Provide needed care management assistance r/t Disease Progression   Start Date: 12/22/2021  Expected End Date: 01/12/2022  Note:   Current Barriers:  Care Coordination needs related to ALS disease progression. Patient unable to perform ADL's independently  Patient unable to perform IADL's independently  RNCM Clinical Goal(s):  Patient will continue to work with the team to address concerns r/t long term plan of care.  Interventions: 1:1 collaboration with primary care provider regarding development and update of comprehensive plan of care as evidenced by provider attestation and co-signature Inter-disciplinary care team collaboration (see longitudinal plan of care) Evaluation of current treatment plan related to  self management and patient's adherence to plan as established by provider   Care Coordination/Goals of Care: Thorough discussion with [name] caregiver/spouse regarding current functional status, goals of care and plan for long term care needs.  Reports [name] was hospitalized at [facility] following respiratory arrest on 12/09/21. During his hospitalization a tracheostomy was placed. Discussed increase in his acuity and anticipated higher level of needs after he returns home. Discussed options for discharge home with home health vs transition to [facility]. Discussed concerns r/t caregiver's comfort with providing tracheostomy care.  She is working closely with the inpatient care management  team. A plan is currently in place for a team to provide training for tracheostomy care prior to discharge. Expressed concerns that she prefers to have assistance overnight if Mr. Gelles transitions home with home health. Recalls discussing services available with [facility] (located within the [facility] facility) however she is unsure if this is still an option.  Collaborated with the inpatient case manager regarding options for Mr. Carlota Raspberry to transition to [facility]. Reports this option is available, however [facility] does not accept Medicaid. Services will need to be billed to his other insurance provider/[employer]. Indicated beds will likely not be available before next week.  Follow up discussion with Mrs. Carlota Raspberry. She will provide the required [facility] insurance information to the inpatient team. They will start the process for approval. Mrs. Pompey indicated that the long-term plan is for Mr. Green to eventually transition home after receiving needed rehab and services. They are not seeking permanent placement in a skilled nursing facility. Confirmed that he is currently approved for 80 hours/month of in-home care services. She would like to request additional hours to ensure he has the needed care when he returns home. Agreed to keep the clinic care team updated. Update 12/31/21: Call received from caregiver regarding previous approval for transition to [facility]. Reports being informed that the approval will expire over the weekend. Expressed concerns regarding other options for  long term acute care.  Aware of options for patient to remain inpatient vice transferring to [facility]. After initial call, she was contacted by the inpatient and [facility] team. Reports being informed that the [facility] team will submit request for an extension. Current plan is for patient to remain inpatient until a bed becomes available at [facility]. Agreed to update the team of patient's status in  preparation for potential in-home needs.   Patient Goals/Self-Care Activities: Work with the care management and inpatient team to address concerns r/t long term plan of care.        PLAN:  A member of the care management team will follow up within the next month.   [facility] Care Management [phone]

## 2021-12-31 NOTE — Plan of Care (Signed)

## 2021-12-31 NOTE — Progress Notes (Signed)
Progress Note   Patient: Jerry [name] NTI:[id number] DOB: 1971-04-13 DOA: 12/09/2021     22 DOS: the patient was seen and examined on 12/31/2021   Brief hospital course: 51 y.o. male with past medical history significant for ALS presented to [facility] ED on 6/7 via EMS after being found by family altered, less responsive with oxygen saturation dropping to the 60s.  Patient is followed by [name] and now ALS has progressed to the point in which he is wheelchair-bound; in addition to increased respiratory distress over the last few weeks.  Recently had PEG tube placement.  On arrival to the emergency department he was intubated immediately in which she had a brief cardiac arrest in the ED at the time of intubation with recurrent cardiac arrest in the ICU on day of admission.   Significant Hospital events: 12/09/2021 intubated. Reportedly brief code in the ED with rapid response RN. Arrived in the ICU. Became hypotensive despite vasopressor support. Lost pulse again and CPR performed with ROSC. Bronchoscopy with erythematous airways with scant thin secretions 6/12 s/p trach 6/14 failed ATC after 10 minutes 6/16 bronchoscopy> thick mucus removed from RLL; culture sent> pseudomonas 6/17 started inhaled tobi, daily azithro 6/20 transferred from [facility] to the hospitalist service 6/22 Remains stable on minimal vent setting, no issues overnight  6/23 Stable on minimal vent settings, awaiting LTACH vs home w/ HH if able to obtain 24h nursing support, discussed with Dr. Lamonte Sakai this am, continue abx w/ azithromycin and nebulized tobramycin until follows up with Dr. [name] outpatient 6/24 remains stable, RT reports clogging of filters on vent tubing causing shortness of breath when utilizing tobramycin nebs; RT now reports changing filters after each neb treatment 6/26: peer to peer completed; approved for [name]; bed pending  Assessment and Plan: Acute on chronic hypoxemic/hypercarbic  respiratory failure 2/2 progressive neuromuscular disease/ALS Patient presenting to ED via EMS after being found altered, with SPO2 in the 60s.  Etiology likely secondary to progressive neuromuscular disease with underlying ALS.  Patient was intubated on admission and likely now vent dependent given progression of ALS.  Patient underwent tracheostomy placement on 12/14/2021. --PCCM following, appreciate assistance --Cont with chest PTas tolerated, RT following   Pseudomonas pneumonia Respiratory culture from bronchoscopy with pansensitive Pseudomonas. --Azithromycin 250 mg per tube daily --Inhaled tobramycin 300 mg every 12 hours (needs vent filter change after each dose due to crystallization within the filter) --Per Dr. Malvin Johns and [name] will continue antibiotics until outpatient follow-up which is scheduled on 02/01/2022 -Remains afebrile   Progressive ALS Patient has been bedbound for the last year currently vent dependent.  Followed by [facility]. --Continue vent --Continue tube feeds via PEG tube --[facility], received approval from peer to peer on 6/26   Autonomic dysfunction Etiology likely secondary to his underlying progressive ALS.  Patient with mild hypotension, but MAP remain >65.  If maps fall below 65, can consider initiation of midodrine but will hold off on this for now as patient has no other endorgan damage and mentating appropriately.   Insomnia:  -Had been on trazodone 50 mg p.o. nightly -Discussed with Palliative Care, recs for trial of hydroxyzine at night      Subjective: States feeling "fine" via computer  Physical Exam: Vitals:   12/31/21 0830 12/31/21 0900 12/31/21 1000 12/31/21 1214  BP:   95/66   Pulse:  89 82   Resp:  (!) 24 20   Temp:  98.3 F (36.8 C)  TempSrc:    Oral  SpO2: 99% 99% 98%   Weight:       General exam: Awake, laying in bed, in nad Respiratory system: Normal respiratory effort, no  wheezing Cardiovascular system: regular rate, s1, s2 Gastrointestinal system: Soft, nondistended, positive BS Central nervous system: CN2-12 grossly intact, strength intact Extremities: Perfused, no clubbing Skin: Normal skin turgor, no notable skin lesions seen Psychiatry: Mood normal // no visual hallucinations   Data Reviewed:  Labs reviewed: Na 141, Cr <0.3, Hgb 8.5, WBC 5.3  Family Communication: Pt in room, family not at bedside  Disposition: Status is: Inpatient Remains inpatient appropriate because: Severity of illness  Planned Discharge Destination: LTAC    Author: Marylu Lund, MD 12/31/2021 2:50 PM  For on call review [email or web address].

## 2021-12-31 NOTE — Progress Notes (Signed)
Patient ID: [name], male   DOB: 17-May-1971, 51 y.o.   MRN: [id number]    Progress Note from the Palliative Medicine Team at [facility]   Patient Name: Jerry [name]        Date: 12/31/2021 DOB: [birth year]  Age: 51 y.o. MRN#: [id number] Attending Physician: Donne Hazel, MD Primary Care Physician: [name], MD Admit Date: 12/09/2021   Medical records reviewed  51 y.o. male   admitted on 12/09/2021 with ALS admitted on 6/7 in the setting of acute hypoxemic respiratory failure requiring intubation and mechanical ventilation.  He had a brief cardiac arrest in the ER around the time of intubation. He had another cardiac arrest in the ICU on the day of admission.     12/09/2021 intubated. Reportedly brief code in the ED with rapid response RN. Arrived in the ICU. Became hypotensive despite vasopressor support. Lost pulse again and CPR performed with ROSC. Bronchoscopy with erythematous airways with scant thin secretions 6/12 s/p trach 6/14 failed ATC after 10 minutes 6/16 bronchoscopy> thick mucus removed from RLL; culture sent> pseudomonas 6/20 remains intubated, stabilized and decisions for transition of care pending 6/23 Stable on minimal vent settings, awaiting LTACH vs home w/ HH if able to obtain 24h nursing support, discussed with Dr. Lamonte Sakai this am, continue abx w/ azithromycin and nebulized tobramycin until follows up with Dr. [name] outpatient 6/24 remains stable, RT reports clogging of filters on vent tubing causing shortness of breath when utilizing tobramycin nebs; RT now reports changing filters after each neb treatment 6/26: peer to peer completed; approved for [name]; bed pending   Patient and family face treatment ongoing option decisions, advanced directive decisions and anticipatory care needs.  This NP visited patient at the bedside as a follow up for palliative medicine needs and emotional support.  Currently patient is resting comfortably.  Continued  conversation with Mr. Nyoka Cowden regarding anxiety. He and his wife were able to speak last night regarding conversation over initiation of new pharmaceutical agent to help him with his anxiety.  Education offered to patient regarding utilization of hydroxyzine for anxiety.  Mr. Stankey shares that anxiety is worse at night but that he feels okay during the day.    -Recommendation is hydroxyzine 50 mg every night at bedtime and                                    hydroxyzine 25 mg twice daily prn as needed during the day.    -Trazodone DC'd at patient request -Patient and wife in agreement.    Spoke to wife by phone, communicated above conversation   PMT will continue to support holistically     Family in process and working with transition of care team and decision regarding disposition to home versus LTAC  Questions and concerns addressed   Discussed with treatment team and Dr Dwain Sarna NP  Palliative Medicine Team Team Phone # [phone] Pager [phone]

## 2022-01-01 DIAGNOSIS — A419 Sepsis, unspecified organism: Secondary | ICD-10-CM | POA: Diagnosis not present

## 2022-01-01 DIAGNOSIS — R6521 Severe sepsis with septic shock: Secondary | ICD-10-CM | POA: Diagnosis not present

## 2022-01-01 DIAGNOSIS — E871 Hypo-osmolality and hyponatremia: Secondary | ICD-10-CM | POA: Diagnosis not present

## 2022-01-01 DIAGNOSIS — G1221 Amyotrophic lateral sclerosis: Secondary | ICD-10-CM | POA: Diagnosis not present

## 2022-01-01 LAB — GLUCOSE, CAPILLARY
Glucose-Capillary: 84 mg/dL (ref 70–99)
Glucose-Capillary: 98 mg/dL (ref 70–99)
Glucose-Capillary: 90 mg/dL (ref 70–99)
Glucose-Capillary: 111 mg/dL — ABNORMAL HIGH (ref 70–99)
Glucose-Capillary: 102 mg/dL — ABNORMAL HIGH (ref 70–99)

## 2022-01-01 NOTE — Progress Notes (Signed)
Speech Language Pathology Treatment: Jerry [name] Speaking valve  Patient Details Name: Jerry [name] MRN: [id number] DOB: 18-May-1971 Today's Date: 01/01/2022 Time: 1110-1130 SLP Time Calculation (min) (ACUTE ONLY): 20 min  Assessment / Plan / Recommendation Clinical Impression  Pt seen in coordination with RT for inline PMV. Cuff deflation with inhaled volume approximately 550 and exhaled dropped below half to 80's and pt responded he was able to adequately breathe throughout session. HR 80, RR 18 and SpO2 100%. Noted to have increased oral secretions needing frequent suctioning. Vocal quality with increased hypophonia today and breathier although with deeper inhalation able to achieve phonation briefly on several occassions. Tolerated valve for approximately 20 min and able to make needs known verbally and needing to use aug comm device a time or two. Continue ST with PMV- RT can also place with pt without ST present.    HPI HPI: [name] is a 51 yo male with hx significant for limb-onset ALS with progressive quadriparesis, anarthria and dysphagia (dx 2020). Admitted 6/7 with progressive respiratory decline, requiring urgent intubation. Brief code in ED s/p CPR 2 min. Trach and bronch 6/12. On ventilator. PEG 10/26/21 due to progressive dysphagia.  Pt participates in the ALS clinic at [facility].      SLP Plan  Continue with current plan of care      Recommendations for follow up therapy are one component of a multi-disciplinary discharge planning process, led by the attending physician.  Recommendations may be updated based on patient status, additional functional criteria and insurance authorization.    Recommendations         Patient may use Passy-Muir Speech Valve: with SLP only (or RT) PMSV Supervision: Full         Oral Care Recommendations: Oral care QID Follow Up Recommendations:  (TBD) Assistance recommended at discharge: Frequent or constant  Supervision/Assistance SLP Visit Diagnosis: Aphonia (R49.1) Plan: Continue with current plan of care           Jerry [name]  01/01/2022, 11:38 AM

## 2022-01-01 NOTE — Progress Notes (Signed)
Patient BW:Jerry [name]      DOB: 01/05/1971      DHR:[id number]      Palliative Medicine Team    Subjective: Bedside symptom check completed. No family or visitors present at time of visit. Bedside RN Lilia Pro present to assist with communication.    This RN followed up with patient after medication adjustment yesterday by PMT NP to assist with anxiety at HS. Patient reporting that his sleep was slightly improved and that he was happy with how the adjustment helped. He wanted to try the same regimen tonight. This RN shared that he also has PRN doses should his anxiety be uncontrolled. He was understanding of this. This RN assisted patient in restarting his communication device as it was frozen. This RN able to make out words mouthed and other gestures of communication with assistance of bedside RN. Will continue along with patient's progress.    Thank you for allowing the Palliative Medicine Team to assist in the care of this patient.     Damian Leavell, MSN, RN Palliative Medicine Team Team Phone: [phone]  This phone is monitored 7a-7p, please reach out to attending physician outside of these hours for urgent needs.

## 2022-01-01 NOTE — TOC Progression Note (Addendum)
Transition of Care [facility]) - Progression Note    Patient Details  Name: Emeril Stille MRN: [id number] Date of Birth: 01-09-1971  Transition of Care [facility]) CM/SW Contact  Tom-Johnson, Renea Ee, RN Phone Number: 01/01/2022, 10:22 AM  Clinical Narrative:     [facility]. [facility] [employer].  11:45- CM notified by [facility] leadership that [facility] has a bed available and could accept patient. CM called and spoke with patient's wife Fredda Hammed and she declined stating that patient had declined [facility] and they will wait for [facility] to have a bed. MD and Leadership notified. CM will continue to follow with needs.    Expected Discharge Plan: [place] Barriers to Discharge: Continued Medical Work up  Expected Discharge Plan and Services Expected Discharge Plan: [place]   Discharge Planning Services: CM Consult Post Acute Care Choice: Home Health, Durable Medical Equipment Living arrangements for the past 2 months: Single Family Home                 DME Arranged: Oxygen ([facility]) DME Agency: [employer] Date DME Agency Contacted: 12/15/21 Time DME Agency Contacted: [phone] Representative spoke with at DME Agency: [place] ([place]) Interventions    Readmission Risk Interventions     No data to display

## 2022-01-01 NOTE — Plan of Care (Signed)

## 2022-01-01 NOTE — Progress Notes (Signed)
Progress Note   Patient: Jerry [name] ZOX:[id number] DOB: 28-Oct-1970 DOA: 12/09/2021     23 DOS: the patient was seen and examined on 01/01/2022   Brief hospital course: 51 y.o. male with past medical history significant for ALS presented to [facility] ED on 6/7 via EMS after being found by family altered, less responsive with oxygen saturation dropping to the 60s.  Patient is followed by [place] clinic and now ALS has progressed to the point in which he is wheelchair-bound; in addition to increased respiratory distress over the last few weeks.  Recently had PEG tube placement.  On arrival to the emergency department he was intubated immediately in which she had a brief cardiac arrest in the ED at the time of intubation with recurrent cardiac arrest in the ICU on day of admission.   Significant Hospital events: 12/09/2021 intubated. Reportedly brief code in the ED with rapid response RN. Arrived in the ICU. Became hypotensive despite vasopressor support. Lost pulse again and CPR performed with ROSC. Bronchoscopy with erythematous airways with scant thin secretions 6/12 s/p trach 6/14 failed ATC after 10 minutes 6/16 bronchoscopy> thick mucus removed from RLL; culture sent> pseudomonas 6/17 started inhaled tobi, daily azithro 6/20 transferred from [facility] to the hospitalist service 6/22 Remains stable on minimal vent setting, no issues overnight  6/23 Stable on minimal vent settings, awaiting LTACH vs home w/ HH if able to obtain 24h nursing support, discussed with Dr. Lamonte Sakai this am, continue abx w/ azithromycin and nebulized tobramycin until follows up with Dr. [name] outpatient 6/24 remains stable, RT reports clogging of filters on vent tubing causing shortness of breath when utilizing tobramycin nebs; RT now reports changing filters after each neb treatment 6/26: peer to peer completed; approved for [name]; bed pending  Assessment and Plan: Acute on chronic hypoxemic/hypercarbic  respiratory failure 2/2 progressive neuromuscular disease/ALS Patient presenting to ED via EMS after being found altered, with SPO2 in the 60s.  Etiology likely secondary to progressive neuromuscular disease with underlying ALS.  Patient was intubated on admission and likely now vent dependent given progression of ALS.  Patient underwent tracheostomy placement on 12/14/2021. --PCCM following, appreciate assistance --Cont with chest PTas tolerated, RT following -Pending LTAC transfer   Pseudomonas pneumonia Respiratory culture from bronchoscopy with pansensitive Pseudomonas. --Azithromycin 250 mg per tube daily --Inhaled tobramycin 300 mg every 12 hours (needs vent filter change after each dose due to crystallization within the filter) --Per Dr. Malvin Johns and [facility] will continue antibiotics until outpatient follow-up which is scheduled on 02/01/2022 -No fevers   Progressive ALS Patient has been bedbound for the last year currently vent dependent.  Followed by [place] clinic. --Continue vent --Continue tube feeds via PEG tube --[facility], received approval from peer to peer on 6/26. Awaiting bed   Autonomic dysfunction Etiology likely secondary to his underlying progressive ALS.  Patient with mild hypotension, but MAP remain >65.  If maps fall below 65, can consider initiation of midodrine but will hold off on this for now as patient has no other endorgan damage and mentating appropriately.   Insomnia:  -Had been on trazodone 50 mg p.o. nightly -Cont hydroxyzine at night      Subjective: Reports feeling "OK" via computer  Physical Exam: Vitals:   01/01/22 1200 01/01/22 1229 01/01/22 1300 01/01/22 1400  BP: 90/60  96/61 (!) 93/59  Pulse: 73  70 74  Resp: '20  20 20  '$ Temp:  98.7 F (37.1  C)    TempSrc:  Oral    SpO2: 100%  100% 100%  Weight:       General exam: Awake, laying in bed, in nad Respiratory system: Normal respiratory effort, no  wheezing Cardiovascular system: regular rate, s1, s2 Gastrointestinal system: Soft, nondistended, positive BS Central nervous system: CN2-12 grossly intact, strength intact Extremities: Perfused, no clubbing Skin: Normal skin turgor, no notable skin lesions seen Psychiatry: Mood normal // no visual hallucinations   Data Reviewed:  There are no new results to review at this time.  Family Communication: Pt in room, family not at bedside  Disposition: Status is: Inpatient Remains inpatient appropriate because: Severity of illness  Planned Discharge Destination: LTAC    Author: Marylu Lund, MD 01/01/2022 3:41 PM  For on call review [email or web address].

## 2022-01-01 NOTE — Progress Notes (Signed)
Range of motion performed on all extremities.

## 2022-01-02 DIAGNOSIS — R6521 Severe sepsis with septic shock: Secondary | ICD-10-CM | POA: Diagnosis not present

## 2022-01-02 DIAGNOSIS — A419 Sepsis, unspecified organism: Secondary | ICD-10-CM | POA: Diagnosis not present

## 2022-01-02 DIAGNOSIS — J9601 Acute respiratory failure with hypoxia: Secondary | ICD-10-CM | POA: Diagnosis not present

## 2022-01-02 DIAGNOSIS — G1221 Amyotrophic lateral sclerosis: Secondary | ICD-10-CM | POA: Diagnosis not present

## 2022-01-02 LAB — GLUCOSE, CAPILLARY
Glucose-Capillary: 101 mg/dL — ABNORMAL HIGH (ref 70–99)
Glucose-Capillary: 105 mg/dL — ABNORMAL HIGH (ref 70–99)
Glucose-Capillary: 113 mg/dL — ABNORMAL HIGH (ref 70–99)
Glucose-Capillary: 131 mg/dL — ABNORMAL HIGH (ref 70–99)
Glucose-Capillary: 96 mg/dL (ref 70–99)
Glucose-Capillary: 96 mg/dL (ref 70–99)
Glucose-Capillary: 98 mg/dL (ref 70–99)

## 2022-01-02 NOTE — Progress Notes (Signed)
Progress Note   Patient: Jerry [name] [id number]:[id number] DOB: [birth year] DOA: 12/09/2021     24 DOS: the patient was seen and examined on 01/02/2022   Brief hospital course: 51 y.o. male with past medical history significant for ALS presented to [facility] ED on 6/7 via EMS after being found by family altered, less responsive with oxygen saturation dropping to the 60s.  Patient is followed by [place] clinic and now ALS has progressed to the point in which he is wheelchair-bound; in addition to increased respiratory distress over the last few weeks.  Recently had PEG tube placement.  On arrival to the emergency department he was intubated immediately in which she had a brief cardiac arrest in the ED at the time of intubation with recurrent cardiac arrest in the ICU on day of admission.   Significant Hospital events: 12/09/2021 intubated. Reportedly brief code in the ED with rapid response RN. Arrived in the ICU. Became hypotensive despite vasopressor support. Lost pulse again and CPR performed with ROSC. Bronchoscopy with erythematous airways with scant thin secretions 6/12 s/p trach 6/14 failed ATC after 10 minutes 6/16 bronchoscopy> thick mucus removed from RLL; culture sent> pseudomonas 6/17 started inhaled tobi, daily azithro 6/20 transferred from [facility] to the hospitalist service 6/22 Remains stable on minimal vent setting, no issues overnight  6/23 Stable on minimal vent settings, awaiting LTACH vs home w/ HH if able to obtain 24h nursing support, discussed with Dr. Lamonte Sakai this am, continue abx w/ azithromycin and nebulized tobramycin until follows up with Dr. [name] outpatient 6/24 remains stable, RT reports clogging of filters on vent tubing causing shortness of breath when utilizing tobramycin nebs; RT now reports changing filters after each neb treatment 6/26: peer to peer completed; approved for [name]; bed pending  Assessment and Plan: Acute on chronic hypoxemic/hypercarbic  respiratory failure 2/2 progressive neuromuscular disease/ALS Patient presenting to ED via EMS after being found altered, with SPO2 in the 60s.  Etiology likely secondary to progressive neuromuscular disease with underlying ALS.  Patient was intubated on admission and likely now vent dependent given progression of ALS.  Patient underwent tracheostomy placement on 12/14/2021. --PCCM following, appreciate assistance --Cont with chest PTas tolerated, RT following - Awaiting LTAC transfer   Pseudomonas pneumonia Respiratory culture from bronchoscopy with pansensitive Pseudomonas. --Azithromycin 250 mg per tube daily --Inhaled tobramycin 300 mg every 12 hours (needs vent filter change after each dose due to crystallization within the filter) --Per Dr. Malvin Johns and [facility] will continue antibiotics until outpatient follow-up which is scheduled on 02/01/2022 -Afebrile   Progressive ALS Patient has been bedbound for the last year currently vent dependent.  Followed by [place] clinic. --Continue vent --Continue tube feeds via PEG tube --[facility], received approval from peer to peer on 6/26. Awaiting bed availability   Autonomic dysfunction Etiology likely secondary to his underlying progressive ALS.  Patient with mild hypotension, but MAP remain >65.  If maps fall below 65, can consider initiation of midodrine but will hold off on this for now as patient has no other endorgan damage and mentating appropriately.   Insomnia:  -Had been on trazodone 50 mg p.o. nightly -Cont hydroxyzine at night      Subjective: No issues  Physical Exam: Vitals:   01/02/22 0700 01/02/22 0801 01/02/22 0805 01/02/22 0900  BP: 100/63   101/63  Pulse: 78   86  Resp: 20   18  Temp:   98.7 F (37.1 C)  TempSrc:   Oral   SpO2: 100% 100%  100%  Weight:       General exam: laying in bed, in no acute distress Respiratory system: normal chest rise, clear, no audible  wheezing Cardiovascular system: regular rhythm, perfused Gastrointestinal system: Nondistended, nontender, pos BS Central nervous system: No seizures, no tremors Extremities: No cyanosis, no joint deformities Skin: No rashes, no pallor Psychiatry: Affect normal // no auditory hallucinations   Data Reviewed:  There are no new results to review at this time.  Family Communication: Family not at bedside  Disposition: Status is: Inpatient Remains inpatient appropriate because: Severity of illness  Planned Discharge Destination: LTAC    Author: Marylu Lund, MD 01/02/2022 12:35 PM  For on call review [email or web address].

## 2022-01-03 DIAGNOSIS — E871 Hypo-osmolality and hyponatremia: Secondary | ICD-10-CM | POA: Diagnosis not present

## 2022-01-03 DIAGNOSIS — G1221 Amyotrophic lateral sclerosis: Secondary | ICD-10-CM | POA: Diagnosis not present

## 2022-01-03 DIAGNOSIS — I469 Cardiac arrest, cause unspecified: Secondary | ICD-10-CM | POA: Diagnosis not present

## 2022-01-03 DIAGNOSIS — A419 Sepsis, unspecified organism: Secondary | ICD-10-CM | POA: Diagnosis not present

## 2022-01-03 LAB — GLUCOSE, CAPILLARY
Glucose-Capillary: 108 mg/dL — ABNORMAL HIGH (ref 70–99)
Glucose-Capillary: 108 mg/dL — ABNORMAL HIGH (ref 70–99)
Glucose-Capillary: 111 mg/dL — ABNORMAL HIGH (ref 70–99)
Glucose-Capillary: 115 mg/dL — ABNORMAL HIGH (ref 70–99)
Glucose-Capillary: 97 mg/dL (ref 70–99)
Glucose-Capillary: 96 mg/dL (ref 70–99)

## 2022-01-03 NOTE — Progress Notes (Signed)
Progress Note   Patient: Jerry [name] BLT:[id number] DOB: Aug 31, 1970 DOA: 12/09/2021     25 DOS: the patient was seen and examined on 01/03/2022   Brief hospital course: 51 y.o. male with past medical history significant for ALS presented to [facility] ED on 6/7 via EMS after being found by family altered, less responsive with oxygen saturation dropping to the 60s.  Patient is followed by [place] clinic and now ALS has progressed to the point in which he is wheelchair-bound; in addition to increased respiratory distress over the last few weeks.  Recently had PEG tube placement.  On arrival to the emergency department he was intubated immediately in which she had a brief cardiac arrest in the ED at the time of intubation with recurrent cardiac arrest in the ICU on day of admission.   Significant Hospital events: 12/09/2021 intubated. Reportedly brief code in the ED with rapid response RN. Arrived in the ICU. Became hypotensive despite vasopressor support. Lost pulse again and CPR performed with ROSC. Bronchoscopy with erythematous airways with scant thin secretions 6/12 s/p trach 6/14 failed ATC after 10 minutes 6/16 bronchoscopy> thick mucus removed from RLL; culture sent> pseudomonas 6/17 started inhaled tobi, daily azithro 6/20 transferred from [facility] to the hospitalist service 6/22 Remains stable on minimal vent setting, no issues overnight  6/23 Stable on minimal vent settings, awaiting LTACH vs home w/ HH if able to obtain 24h nursing support, discussed with Dr. Lamonte Sakai this am, continue abx w/ azithromycin and nebulized tobramycin until follows up with Dr. [name] outpatient 6/24 remains stable, RT reports clogging of filters on vent tubing causing shortness of breath when utilizing tobramycin nebs; RT now reports changing filters after each neb treatment 6/26: peer to peer completed; approved for [name]; bed pending  Assessment and Plan: Acute on chronic hypoxemic/hypercarbic  respiratory failure 2/2 progressive neuromuscular disease/ALS Patient presenting to ED via EMS after being found altered, with SPO2 in the 60s.  Etiology likely secondary to progressive neuromuscular disease with underlying ALS.  Patient was intubated on admission and likely now vent dependent given progression of ALS.  Patient underwent tracheostomy placement on 12/14/2021. --PCCM following, appreciate assistance --Cont with chest PTas tolerated, RT following - Awaiting potential LTAC transfer   Pseudomonas pneumonia Respiratory culture from bronchoscopy with pansensitive Pseudomonas. --Azithromycin 250 mg per tube daily --Inhaled tobramycin 300 mg every 12 hours (needs vent filter change after each dose due to crystallization within the filter) --Per Dr. Malvin Johns and [facility] will continue antibiotics until outpatient follow-up which is scheduled on 02/01/2022 -Remains afebrile   Progressive ALS Patient has been bedbound for the last year currently vent dependent.  Followed by [place] clinic. --Continue vent --Continue tube feeds via PEG tube --[facility], received approval from peer to peer on 6/26. Awaiting bed availability   Autonomic dysfunction Etiology likely secondary to his underlying progressive ALS.  Patient with mild hypotension, but MAP remain >65.  If maps fall below 65, can consider initiation of midodrine but will hold off on this for now as patient has no other endorgan damage and mentating appropriately.   Insomnia:  -Had been on trazodone 50 mg p.o. nightly -Cont hydroxyzine at night      Subjective: Without issues noted  Physical Exam: Vitals:   01/03/22 1400 01/03/22 1500 01/03/22 1546 01/03/22 1600  BP: 93/65 92/61  93/60  Pulse: 78 81  78  Resp: '15 18  16  '$ Temp:   98.2 F (  36.8 C)   TempSrc:   Oral   SpO2: 100% 100%  100%  Weight:       General exam: Awake, laying in bed, in nad Respiratory system: Normal respiratory  effort, no wheezing Cardiovascular system: regular rate, s1, s2 Gastrointestinal system: Soft, nondistended, positive BS Central nervous system: CN2-12 grossly intact, strength intact Extremities: Perfused, no clubbing Skin: Normal skin turgor, no notable skin lesions seen Psychiatry: Mood normal // affect seems normal  Data Reviewed:  There are no new results to review at this time.  Family Communication: Family at bedside  Disposition: Status is: Inpatient Remains inpatient appropriate because: Severity of illness  Planned Discharge Destination: LTAC    Author: Marylu Lund, MD 01/03/2022 4:30 PM  For on call review [email or web address].

## 2022-01-04 DIAGNOSIS — A419 Sepsis, unspecified organism: Secondary | ICD-10-CM | POA: Diagnosis not present

## 2022-01-04 DIAGNOSIS — R6521 Severe sepsis with septic shock: Secondary | ICD-10-CM | POA: Diagnosis not present

## 2022-01-04 DIAGNOSIS — J9601 Acute respiratory failure with hypoxia: Secondary | ICD-10-CM | POA: Diagnosis not present

## 2022-01-04 DIAGNOSIS — I469 Cardiac arrest, cause unspecified: Secondary | ICD-10-CM | POA: Diagnosis not present

## 2022-01-04 LAB — GLUCOSE, CAPILLARY
Glucose-Capillary: 126 mg/dL — ABNORMAL HIGH (ref 70–99)
Glucose-Capillary: 106 mg/dL — ABNORMAL HIGH (ref 70–99)
Glucose-Capillary: 114 mg/dL — ABNORMAL HIGH (ref 70–99)
Glucose-Capillary: 106 mg/dL — ABNORMAL HIGH (ref 70–99)
Glucose-Capillary: 82 mg/dL (ref 70–99)
Glucose-Capillary: 85 mg/dL (ref 70–99)

## 2022-01-04 NOTE — TOC Progression Note (Signed)
Transition of Care [facility]) - Progression Note    Patient Details  Name: Jerry [name] MRN: [id number] Date of Birth: 08-Feb-1971  [facility] of [facility]) CM/SW Contact  Tom-Johnson, Renea Ee, RN Phone Number: 01/04/2022, 3:04 PM  Clinical Narrative:     CM spoke with Anderson Malta at [place] and she states they have submitted to [employer]. for Re authorization as prior authorization expired without patient getting a bed available.  CM will continue to follow with needs.    Expected Discharge Plan: [place] Barriers to Discharge: Continued Medical Work up  Expected Discharge Plan and Services Expected Discharge Plan: [place]   Discharge Planning Services: CM Consult Post Acute Care Choice: Home Health, Durable Medical Equipment Living arrangements for the past 2 months: Single Family Home                 DME Arranged: Oxygen ([facility]) DME Agency: [employer] Date DME Agency Contacted: 12/15/21 Time DME Agency Contacted: [phone] Representative spoke with at DME Agency: [place] ([place]) Interventions    Readmission Risk Interventions     No data to display

## 2022-01-04 NOTE — Progress Notes (Signed)
Nutrition Follow-up  DOCUMENTATION CODES:   Underweight  INTERVENTION:   Continue with continuous tube feeding regimen via PEG: [employer] Standard 1.4 @ 65 ml/hr (1560 ml/day = 5 cartons) - Free water flushes of 100 ml q 4 hours  Continuous tube feeding regimen provides 2184 kcal, 96 grams of protein, and 1109 ml of H2O (meets >100% of estimated kcal and protein needs).  Total free water with flushes: 1709 ml  NUTRITION DIAGNOSIS:   Inadequate oral intake related to inability to eat as evidenced by NPO status.  Ongoing, being addressed via TF  GOAL:   Patient will meet greater than or equal to 90% of their needs  Met via TF  MONITOR:   Vent status, Labs, Weight trends, TF tolerance  REASON FOR ASSESSMENT:   Ventilator, Consult Enteral/tube feeding initiation and management  ASSESSMENT:   51 year old male who presented to the ED on 6/07 with AMS. PMH of ALS which has progressed since last year on home NIV, wheelchair-bound at baseline. Pt admitted with septic shock due to bilateral multifocal PNA, ileus.  06/07 - Code Blue, s/p bronchoscopy 06/08 - tube feeds initiated via PEG 06/12 - s/p tracheostomy  RD working remotely.  Pt pending d/c to LTACH once bed becomes available. No tube feeding tolerance issues noted. Will continue with current TF regimen at this time.  Current TF: [employer] Standard 1.4 @ 65 ml/hr, free water flushes of 100 ml q 4 hours  Admit weight: 42.6 kg Current weight: 54.6 kg  Patient remains on ventilator support via trach MV: 9.8 L/min Temp (24hrs), Avg:98.6 F (37 C), Min:98.2 F (36.8 C), Max:99.2 F (37.3 C)  Medications reviewed and include: melatonin, protonix, miralax, scopolamine patch  Labs reviewed. CBG's: 96-126 x 24 hours  UOP: 1150 ml x 24 hours I/O's: +9.6 L since admit  Diet Order:   Diet Order             Diet NPO time specified  Diet effective now                   EDUCATION NEEDS:   No  education needs have been identified at this time  Skin:  Skin Assessment: Skin Integrity Issues: Stage I: R ear Other: skin tear bilateral buttocks  Last BM:  01/02/22 large type 5  Height:   Ht Readings from Last 1 Encounters:  11/18/21 '5\' 5"'  (1.651 m)    Weight:   Wt Readings from Last 1 Encounters:  12/30/21 54.6 kg    BMI:  Body mass index is 20.03 kg/m.  Estimated Nutritional Needs:   Kcal:  1700-1900  Protein:  80-95 grams  Fluid:  1.7-1.9 L    Gustavus Bryant, MS, RD, LDN Inpatient Clinical Dietitian Please see AMiON for contact information.

## 2022-01-04 NOTE — Progress Notes (Signed)
Progress Note   Patient: Jerry [name] [id number] DOB: June 14, 1971 DOA: 12/09/2021     26 DOS: the patient was seen and examined on 01/04/2022   Brief hospital course: 51 y.o. male with past medical history significant for ALS presented to [facility] ED on 6/7 via EMS after being found by family altered, less responsive with oxygen saturation dropping to the 60s.  Patient is followed by [facility] and now ALS has progressed to the point in which he is wheelchair-bound; in addition to increased respiratory distress over the last few weeks.  Recently had PEG tube placement.  On arrival to the emergency department he was intubated immediately in which she had a brief cardiac arrest in the ED at the time of intubation with recurrent cardiac arrest in the ICU on day of admission.   Significant Hospital events: 12/09/2021 intubated. Reportedly brief code in the ED with rapid response RN. Arrived in the ICU. Became hypotensive despite vasopressor support. Lost pulse again and CPR performed with ROSC. Bronchoscopy with erythematous airways with scant thin secretions 6/12 s/p trach 6/14 failed ATC after 10 minutes 6/16 bronchoscopy> thick mucus removed from RLL; culture sent> pseudomonas 6/17 started inhaled tobi, daily azithro 6/20 transferred from [facility] to the hospitalist service 6/22 Remains stable on minimal vent setting, no issues overnight  6/23 Stable on minimal vent settings, awaiting LTACH vs home w/ HH if able to obtain 24h nursing support, discussed with Dr. Lamonte Sakai this am, continue abx w/ azithromycin and nebulized tobramycin until follows up with Dr. [name] outpatient 6/24 remains stable, RT reports clogging of filters on vent tubing causing shortness of breath when utilizing tobramycin nebs; RT now reports changing filters after each neb treatment 6/26: peer to peer completed; approved for [name]; bed pending  Assessment and Plan: Acute on chronic hypoxemic/hypercarbic  respiratory failure 2/2 progressive neuromuscular disease/ALS Patient presenting to ED via EMS after being found altered, with SPO2 in the 60s.  Etiology likely secondary to progressive neuromuscular disease with underlying ALS.  Patient was intubated on admission and likely now vent dependent given progression of ALS.  Patient underwent tracheostomy placement on 12/14/2021. --PCCM following, appreciate assistance --Cont with chest PTas tolerated, RT following - Pending potential LTAC transfer   Pseudomonas pneumonia Respiratory culture from bronchoscopy with pansensitive Pseudomonas. --Azithromycin 250 mg per tube daily --Inhaled tobramycin 300 mg every 12 hours (needs vent filter change after each dose due to crystallization within the filter) --Per Dr. Malvin Johns and [facility] will continue antibiotics until outpatient follow-up which is scheduled on 02/01/2022 -Remains afebrile   Progressive ALS Patient has been bedbound for the last year currently vent dependent.  Followed by [facility]. --Continue vent --Continue tube feeds via PEG tube --Pending [facility] to [facility], received approval from peer to peer on 6/26. Awaiting bed availability   Autonomic dysfunction Etiology likely secondary to his underlying progressive ALS.  Patient with mild hypotension, but MAP remain >65.  If maps fall below 65, can consider initiation of midodrine but will hold off on this for now as patient has no other endorgan damage and mentating appropriately.   Insomnia:  -Had been on trazodone 50 mg p.o. nightly -Cont hydroxyzine at night      Subjective: No issues noted  Physical Exam: Vitals:   01/04/22 1100 01/04/22 1106 01/04/22 1200 01/04/22 1300  BP: '97/63 96/60 95/64 '$ 103/65  Pulse: 70 69 72 71  Resp: '15 18 16 20  '$ Temp:  98.4 F (36.9  C)    TempSrc:  Oral    SpO2: 100% 100% 100% 100%  Weight:       General exam: Laying in bed, in no acute distress Respiratory system:  normal chest rise, clear, no audible wheezing Cardiovascular system: regular rhythm, s1-s2 Gastrointestinal system: Nondistended, nontender, pos BS Central nervous system: No seizures, no tremors Extremities: No cyanosis, no joint deformities Skin: No rashes, no pallor  Data Reviewed:  There are no new results to review at this time.  Family Communication: Family not at bedside  Disposition: Status is: Inpatient Remains inpatient appropriate because: Severity of illness  Planned Discharge Destination: LTAC    Author: Marylu Lund, MD 01/04/2022 2:27 PM  For on call review [email or web address].

## 2022-01-04 NOTE — Progress Notes (Signed)
NAME:  Jerry [name], MRN:  [id number], DOB:  1971-02-01, LOS: 40 ADMISSION DATE:  12/09/2021, CONSULTATION DATE:  6/7 REFERRING MD: Ralene Bathe EDP , CHIEF COMPLAINT:  Dyspnea   History of Present Illness:  51 y/o male with ALS admitted on 6/7 in the setting of acute hypoxemic respiratory failure requiring intubation and mechanical ventilation.  He had a brief cardiac arrest in the ER around the time of intubation. He had another cardiac arrest in the ICU on the day of admission.    Pertinent  Medical History  ALS COVID-19 Morbid obesity Prediabetes  Significant Hospital Events: Including procedures, antibiotic start and stop dates in addition to other pertinent events   12/09/2021 intubated. Reportedly brief code in the ED with rapid response RN. Arrived in the ICU. Became hypotensive despite vasopressor support. Lost pulse again and CPR performed with ROSC. Bronchoscopy with erythematous airways with scant thin secretions 6/12 s/p trach 6/14 failed ATC after 10 minutes 6/16 bronchoscopy> thick mucus removed from RLL; culture sent> pseudomonas 6/17 started inhaled tobi, daily azithro 6/21 Remains stable on minimal vent setting, no issues overnight  6/27 remains on PRVC, tobra nebs, azithro  Interim History / Subjective:  Remains on tobra nebs and azithromycin   Objective   Blood pressure 103/65, pulse 71, temperature 98.4 F (36.9 C), temperature source Oral, resp. rate 20, weight 54.6 kg, SpO2 100 %.    Vent Mode: PRVC FiO2 (%):  [30 %] 30 % Set Rate:  [20 bmp] 20 bmp Vt Set:  [490 mL] 490 mL PEEP:  [5 cmH20] 5 cmH20 Plateau Pressure:  [15 cmH20-18 cmH20] 16 cmH20   Intake/Output Summary (Last 24 hours) at 01/04/2022 1344 Last data filed at 01/04/2022 1300 Gross per 24 hour  Intake 2045 ml  Output 1000 ml  Net 1045 ml    Filed Weights   12/27/21 0500 12/28/21 0500 12/30/21 0400  Weight: 59.6 kg 60.3 kg 54.6 kg    Examination: Gen:      Trach to vent, no distress Lungs:     NWOB, on full support CV:         RRR no mrg Abd:      + bowel sounds; soft, non-tender; no palpable masses, no distension Ext:   Thin,  No edema Skin:      Warm and dry; no rashes Neuro:   nonverbal, no extremity movement   Resolved Hospital Problem list   Septic shock Hyponatremia Hypokalemia Hypophosphatemia Ileus in setting of electrolyte imbalance  S/P in hospital PEA cardiac arrest due to respiratory failure Atelectasis, right lower lobe collapse  Assessment & Plan:  Acute on chronic hypoxemic and hypercarbic respiratory failure due to progressive neuromuscular disease  -Now s/p tracheostomy > failed ATC attempt on 6/14 Pneumonia due to pansensitive pseudomonas -Improved pneumonia but has persistently positive tracheobronchitis Thick pulmonary secretions, at risk for RLL collapse P: PRVC, has failed attempts at PSV Wean PEEP and FiO2 for sats greater than 90%. Head of bed elevated 30 degrees.  Ensure adequate pulmonary hygiene  VAP bundle in place  Discontinue Azithromycin as has had 2+ weeks Continue inhaled Tobramycin for colonization of pseudomonas, usually do 2-4 weeks but note indicate to continue until OP pulm f/u 7/31 Guaifenesin Home vent upon discharge   Will continue to follow once weekly.   Lanier Clam, MD [facility] 01/04/2022 1:44 PM Contact info: see AMION  If no response to pager, please call critical care on call (see AMION) until  7pm After 7:00 pm call Elink

## 2022-01-05 DIAGNOSIS — J9601 Acute respiratory failure with hypoxia: Secondary | ICD-10-CM | POA: Diagnosis not present

## 2022-01-05 DIAGNOSIS — A419 Sepsis, unspecified organism: Secondary | ICD-10-CM | POA: Diagnosis not present

## 2022-01-05 DIAGNOSIS — G1221 Amyotrophic lateral sclerosis: Secondary | ICD-10-CM | POA: Diagnosis not present

## 2022-01-05 DIAGNOSIS — I469 Cardiac arrest, cause unspecified: Secondary | ICD-10-CM | POA: Diagnosis not present

## 2022-01-05 LAB — GLUCOSE, CAPILLARY
Glucose-Capillary: 102 mg/dL — ABNORMAL HIGH (ref 70–99)
Glucose-Capillary: 110 mg/dL — ABNORMAL HIGH (ref 70–99)
Glucose-Capillary: 86 mg/dL (ref 70–99)
Glucose-Capillary: 91 mg/dL (ref 70–99)
Glucose-Capillary: 97 mg/dL (ref 70–99)
Glucose-Capillary: 97 mg/dL (ref 70–99)

## 2022-01-05 MED ORDER — KATE FARMS STANDARD 1.4 PO LIQD
65.0000 mL/h | Freq: Three times a day (TID) | ORAL | Status: DC
Start: 2022-01-05 — End: 2022-01-08
  Administered 2022-01-05 – 2022-01-08 (×10): 65 mL/h
  Filled 2022-01-05 (×14): qty 325

## 2022-01-05 MED ORDER — KATE FARMS STANDARD 1.4 PO LIQD
65.0000 mL/h | Freq: Three times a day (TID) | ORAL | Status: DC
  Filled 2022-01-05: qty 650

## 2022-01-05 NOTE — Progress Notes (Signed)
Progress Note   Patient: Jerry [name] [id number] DOB: 12/14/1970 DOA: 12/09/2021     27 DOS: the patient was seen and examined on 01/05/2022   Brief hospital course: 51 y.o. male with past medical history significant for ALS presented to [facility] ED on 6/7 via EMS after being found by family altered, less responsive with oxygen saturation dropping to the 60s.  Patient is followed by [facility] and now ALS has progressed to the point in which he is wheelchair-bound; in addition to increased respiratory distress over the last few weeks.  Recently had PEG tube placement.  On arrival to the emergency department he was intubated immediately in which she had a brief cardiac arrest in the ED at the time of intubation with recurrent cardiac arrest in the ICU on day of admission.   Significant Hospital events: 12/09/2021 intubated. Reportedly brief code in the ED with rapid response RN. Arrived in the ICU. Became hypotensive despite vasopressor support. Lost pulse again and CPR performed with ROSC. Bronchoscopy with erythematous airways with scant thin secretions 6/12 s/p trach 6/14 failed ATC after 10 minutes 6/16 bronchoscopy> thick mucus removed from RLL; culture sent> pseudomonas 6/17 started inhaled tobi, daily azithro 6/20 transferred from [facility] to the hospitalist service 6/22 Remains stable on minimal vent setting, no issues overnight  6/23 Stable on minimal vent settings, awaiting LTACH vs home w/ HH if able to obtain 24h nursing support, discussed with Dr. Lamonte Sakai this am, continue abx w/ azithromycin and nebulized tobramycin until follows up with Dr. [name] outpatient 6/24 remains stable, RT reports clogging of filters on vent tubing causing shortness of breath when utilizing tobramycin nebs; RT now reports changing filters after each neb treatment 6/26: peer to peer completed; approved for [name]; bed pending  Assessment and Plan: Acute on chronic hypoxemic/hypercarbic  respiratory failure 2/2 progressive neuromuscular disease/ALS Patient presenting to ED via EMS after being found altered, with SPO2 in the 60s.  Etiology likely secondary to progressive neuromuscular disease with underlying ALS.  Patient was intubated on admission and likely now vent dependent given progression of ALS.  Patient underwent tracheostomy placement on 12/14/2021. --PCCM following, appreciate assistance --Cont with chest PTas tolerated, RT following - Pending possible LTAC transfer, awaiting insurance authorization   Pseudomonas pneumonia Respiratory culture from bronchoscopy with pansensitive Pseudomonas. --Had been on Azithromycin 250 mg per tube daily --Inhaled tobramycin 300 mg every 12 hours (needs vent filter change after each dose due to crystallization within the filter) --Initially per Dr. Malvin Johns and [facility] will continue antibiotics until outpatient follow-up which is scheduled on 02/01/2022. Discussed with Dr. Silas Flood who has since d/c'd subsequent abx  -Remains afebrile   Progressive ALS Patient has been bedbound for the last year currently vent dependent.  Followed by [facility]. --Continue vent --Continue tube feeds via PEG tube --Pending discharge to [facility], received approval from peer to peer on 6/26. Discharge delayed secondary to no bed availability. Now pending insurance auth extension   Autonomic dysfunction Etiology likely secondary to his underlying progressive ALS.  Patient with mild hypotension, but MAP remain >65.  If maps fall below 65, can consider initiation of midodrine but will hold off on this for now as patient has no other endorgan damage and mentating appropriately.   Insomnia:  -Had been on trazodone 50 mg p.o. nightly -Cont hydroxyzine at night      Subjective: Without complaints  Physical Exam: Vitals:   01/05/22 1105 01/05/22 1122 01/05/22 1200  01/05/22 1300  BP: 107/75 107/75 110/76 107/73  Pulse: 67 68 69 68   Resp: '18  20 20  '$ Temp:  98.4 F (36.9 C)    TempSrc:  Oral    SpO2: 100%  100% 100%  Weight:       General exam: Awake, laying in bed, in nad Respiratory system: Normal respiratory effort, no wheezing Cardiovascular system: regular rate, s1, s2 Gastrointestinal system: Soft, nondistended, positive BS Central nervous system: CN2-12 grossly intact, strength intact Extremities: Perfused, no clubbing Skin: Normal skin turgor, no notable skin lesions seen Psychiatry: Mood normal // no visual hallucinations   Data Reviewed:  There are no new results to review at this time.  Family Communication: Family at bedside  Disposition: Status is: Inpatient Remains inpatient appropriate because: Severity of illness  Planned Discharge Destination: LTAC    Author: Marylu Lund, MD 01/05/2022 2:19 PM  For on call review [email or web address].

## 2022-01-05 NOTE — Progress Notes (Signed)
Patient has been refusing to be repositioned. This RN educated the patient on the importance of turning every 2 hours. The patient states "you fixed my pain" and states "we will do it later." Will continue to encourage and educate the patient on importance of turning.

## 2022-01-05 NOTE — Progress Notes (Signed)
Pt refused his scheduled CPT @ 2000 due to receiving CPT prn at 1700. Will resume at next rounds. RT will continue to monitor.

## 2022-01-06 DIAGNOSIS — G909 Disorder of the autonomic nervous system, unspecified: Secondary | ICD-10-CM

## 2022-01-06 DIAGNOSIS — J151 Pneumonia due to Pseudomonas: Secondary | ICD-10-CM

## 2022-01-06 DIAGNOSIS — Z930 Tracheostomy status: Secondary | ICD-10-CM

## 2022-01-06 DIAGNOSIS — G1221 Amyotrophic lateral sclerosis: Secondary | ICD-10-CM | POA: Diagnosis not present

## 2022-01-06 DIAGNOSIS — Z9911 Dependence on respirator [ventilator] status: Secondary | ICD-10-CM

## 2022-01-06 LAB — GLUCOSE, CAPILLARY
Glucose-Capillary: 80 mg/dL (ref 70–99)
Glucose-Capillary: 120 mg/dL — ABNORMAL HIGH (ref 70–99)
Glucose-Capillary: 98 mg/dL (ref 70–99)
Glucose-Capillary: 106 mg/dL — ABNORMAL HIGH (ref 70–99)
Glucose-Capillary: 91 mg/dL (ref 70–99)
Glucose-Capillary: 93 mg/dL (ref 70–99)

## 2022-01-06 LAB — CBC
HCT: 28.9 % — ABNORMAL LOW (ref 39.0–52.0)
Hemoglobin: 9.3 g/dL — ABNORMAL LOW (ref 13.0–17.0)
MCH: 30.3 pg (ref 26.0–34.0)
MCHC: 32.2 g/dL (ref 30.0–36.0)
MCV: 94.1 fL (ref 80.0–100.0)
Platelets: 296 10*3/uL (ref 150–400)
RBC: 3.07 MIL/uL — ABNORMAL LOW (ref 4.22–5.81)
RDW: 14.6 % (ref 11.5–15.5)
WBC: 4.3 10*3/uL (ref 4.0–10.5)
nRBC: 0 % (ref 0.0–0.2)

## 2022-01-06 LAB — COMPREHENSIVE METABOLIC PANEL
ALT: 22 U/L (ref 0–44)
AST: 17 U/L (ref 15–41)
Albumin: 2.9 g/dL — ABNORMAL LOW (ref 3.5–5.0)
Alkaline Phosphatase: 70 U/L (ref 38–126)
Anion gap: 9 (ref 5–15)
BUN: 12 mg/dL (ref 6–20)
CO2: 25 mmol/L (ref 22–32)
Calcium: 9.2 mg/dL (ref 8.9–10.3)
Chloride: 107 mmol/L (ref 98–111)
Creatinine, Ser: <0.3 mg/dL — ABNORMAL LOW (ref 0.61–1.24)
Glucose, Bld: 93 mg/dL (ref 70–99)
Potassium: 3.9 mmol/L (ref 3.5–5.1)
Sodium: 141 mmol/L (ref 135–145)
Total Bilirubin: 0.6 mg/dL (ref 0.3–1.2)
Total Protein: 6.1 g/dL — ABNORMAL LOW (ref 6.5–8.1)

## 2022-01-06 NOTE — TOC Progression Note (Signed)
Transition of Care [facility]) - Progression Note    Patient Details  Name: Jerry [name] MRN: [id number] Date of Birth: September 22, 1970  Transition of Care [facility]) CM/SW Contact  Tom-Johnson, Renea Ee, RN Phone Number: 01/06/2022, 1:17 PM  Clinical Narrative:     CM spoke with Delsa Sale at [facility]. States Insurance is in process of [employer] auth and patient will be admitted when a bed is available. CM will continue to follow for needs.   Expected Discharge Plan: [place] Barriers to Discharge: Continued Medical Work up  Expected Discharge Plan and Services Expected Discharge Plan: [place]   Discharge Planning Services: CM Consult Post Acute Care Choice: Home Health, Durable Medical Equipment Living arrangements for the past 2 months: Single Family Home                 DME Arranged: Oxygen ([facility]) DME Agency: [employer] Date DME Agency Contacted: 12/15/21 Time DME Agency Contacted: [phone] Representative spoke with at DME Agency: [place] ([place]) Interventions    Readmission Risk Interventions     No data to display

## 2022-01-06 NOTE — Progress Notes (Signed)
Tobi nebulizer not given at this time.  Medication not avail on patients floor at this time.  Pharmacy has been notified and I will administer when it arrives.

## 2022-01-06 NOTE — Progress Notes (Signed)
PROGRESS NOTE    Jerry [name]  [id number] DOB: Feb 26, 1971 DOA: 12/09/2021 PCP: [name], MD     Brief Narrative:  51 y.o. male with past medical history significant for ALS presented to [facility] ED on 6/7 via EMS after being found by family altered, less responsive with oxygen saturation dropping to the 60s.  Patient is followed by [facility] and now ALS has progressed to the point in which he is wheelchair-bound; in addition to increased respiratory distress over the last few weeks.  Recently had PEG tube placement.  On arrival to the emergency department he was intubated immediately in which she had a brief cardiac arrest in the ED at the time of intubation with recurrent cardiac arrest in the ICU on day of admission.   Significant Hospital events: 12/09/2021 intubated. Reportedly brief code in the ED with rapid response RN. Arrived in the ICU. Became hypotensive despite vasopressor support. Lost pulse again and CPR performed with ROSC. Bronchoscopy with erythematous airways with scant thin secretions 6/12 s/p trach 6/14 failed ATC after 10 minutes 6/16 bronchoscopy> thick mucus removed from RLL; culture sent> pseudomonas 6/17 started inhaled tobi, daily azithro 6/20 transferred from [facility] to the hospitalist service 6/22 Remains stable on minimal vent setting, no issues overnight  6/23 Stable on minimal vent settings, awaiting LTACH vs home w/ HH if able to obtain 24h nursing support, discussed with Dr. Lamonte Sakai this am, continue abx w/ azithromycin and nebulized tobramycin until follows up with Dr. [name] outpatient 6/24 remains stable, RT reports clogging of filters on vent tubing causing shortness of breath when utilizing tobramycin nebs; RT now reports changing filters after each neb treatment 6/26: peer to peer completed; approved for [name]; bed pending 7/5: insurance auth expired, awaiting reinstating auth and patient awaiting bed   New events last 24 hours /  Subjective: No acute events reported overnight.  Patient states "ok" when asked how he is feeling  Assessment & Plan:   Principal Problem:   ALS (amyotrophic lateral sclerosis) (HCC) Active Problems:   S/P percutaneous endoscopic gastrostomy (PEG) tube placement ([place])   Anxiety   Septic shock ([place])   Cardiac arrest ([place])   Acute hypoxemic respiratory failure (HCC)   Pressure injury of skin   Pseudomonas pneumonia (HCC)   Autonomic dysfunction   Ventilator dependent ([place])   Tracheostomy dependent (HCC)   Acute on chronic hypoxemic and hypercarbic respiratory failure secondary to progressive neuromuscular disease/ALS Patient presenting to ED via EMS after being found altered, with SPO2 in the 60s.  Etiology likely secondary to progressive neuromuscular disease with underlying ALS.  Patient was intubated on admission and likely now vent dependent given progression of ALS.  Patient underwent tracheostomy placement on 12/14/2021. -Trach/vent dependent -LTAC insurance auth and bed placement pending   Pseudomonas pneumonia -Septic shock at time of admission, admitted to ICU  -Now off antibiotics   Progressive ALS -Patient has been bedbound for the last year currently vent dependent.  Followed by [facility] [facility]. -Continue trach and PEG care   Autonomic dysfunction -Etiology likely secondary to his underlying progressive ALS.  Patient with mild hypotension, but MAP remain >65.  If maps fall below 65, can consider initiation of midodrine but will hold off on this for now as patient has no other endorgan damage and mentating appropriately   Insomnia, anxiety -Hydroxyzine nightly as needed and nightly  In agreement with assessment of the pressure ulcer as below:  Pressure Injury 12/26/21 Ear  Right Stage 1 -  Intact skin with non-blanchable redness of a localized area usually over a bony prominence. (Active)  12/26/21 2000  Location: Ear  Location Orientation: Right   Staging: Stage 1 -  Intact skin with non-blanchable redness of a localized area usually over a bony prominence.  Wound Description (Comments):   Present on Admission:   Dressing Type None 01/05/22 2000    DVT prophylaxis:  enoxaparin (LOVENOX) injection 40 mg Start: 12/21/21 1145  Code Status: Full Family Communication: None at bedside  Disposition Plan:  Status is: Inpatient Remains inpatient appropriate because: Awaiting LTAC placement   Antimicrobials:  Anti-infectives (From admission, onward)    Start     Dose/Rate Route Frequency Ordered Stop   12/21/21 1000  azithromycin (ZITHROMAX) tablet 250 mg  Status:  Discontinued        250 mg Per Tube Daily 12/20/21 1141 01/04/22 1346   12/19/21 1145  azithromycin (ZITHROMAX) tablet 250 mg  Status:  Discontinued        250 mg Oral Daily 12/19/21 1051 12/20/21 1141   12/19/21 1145  tobramycin (PF) (TOBI) nebulizer solution 300 mg        300 mg Nebulization 2 times daily 12/19/21 1051     12/11/21 1100  piperacillin-tazobactam (ZOSYN) IVPB 3.375 g        3.375 g 12.5 mL/hr over 240 Minutes Intravenous Every 8 hours 12/11/21 1007 12/18/21 0916   12/09/21 0900  ampicillin-sulbactam (UNASYN) 1.5 g in sodium chloride 0.9 % 100 mL IVPB  Status:  Discontinued        1.5 g 200 mL/hr over 30 Minutes Intravenous Every 6 hours 12/09/21 0806 12/11/21 1007        Objective: Vitals:   01/06/22 1000 01/06/22 1151 01/06/22 1200 01/06/22 1217  BP: 95/65 100/68 104/72   Pulse: 69 67 71   Resp: '11 20 18   '$ Temp:    98.2 F (36.8 C)  TempSrc:    Oral  SpO2: 100% 100% 100%   Weight:        Intake/Output Summary (Last 24 hours) at 01/06/2022 1343 Last data filed at 01/06/2022 0900 Gross per 24 hour  Intake 1775 ml  Output 1415 ml  Net 360 ml   Filed Weights   12/30/21 0400 01/05/22 0500 01/06/22 0404  Weight: 54.6 kg 54.9 kg 55.1 kg    Examination:  General exam: Appears calm and comfortable  Respiratory system: Clear to  auscultation anteriorly, vent/trach  Cardiovascular system: S1 & S2 heard Gastrointestinal system: Abdomen is nondistended, soft Central nervous system: Alert    Data Reviewed: I have personally reviewed following labs and imaging studies  CBC: Recent Labs  Lab 12/31/21 0201 01/06/22 0324  WBC 5.3 4.3  HGB 8.5* 9.3*  HCT 26.4* 28.9*  MCV 94.6 94.1  PLT 241 440   Basic Metabolic Panel: Recent Labs  Lab 12/31/21 0201 01/06/22 0324  NA 141 141  K 3.8 3.9  CL 112* 107  CO2 24 25  GLUCOSE 116* 93  BUN 9 12  CREATININE <0.30* <0.30*  CALCIUM 8.6* 9.2   GFR: CrCl cannot be calculated (This lab value cannot be used to calculate CrCl because it is not a number: <0.30). Liver Function Tests: Recent Labs  Lab 12/31/21 0201 01/06/22 0324  AST 12* 17  ALT 20 22  ALKPHOS 58 70  BILITOT 0.4 0.6  PROT 5.4* 6.1*  ALBUMIN 2.5* 2.9*   No results for input(s): "LIPASE", "AMYLASE" in the last  168 hours. No results for input(s): "AMMONIA" in the last 168 hours. Coagulation Profile: No results for input(s): "INR", "PROTIME" in the last 168 hours. Cardiac Enzymes: No results for input(s): "CKTOTAL", "CKMB", "CKMBINDEX", "TROPONINI" in the last 168 hours. BNP (last 3 results) No results for input(s): "PROBNP" in the last 8760 hours. HbA1C: No results for input(s): "HGBA1C" in the last 72 hours. CBG: Recent Labs  Lab 01/05/22 2014 01/05/22 2350 01/06/22 0322 01/06/22 0807 01/06/22 1214  GLUCAP 86 102* 80 120* 98   Lipid Profile: No results for input(s): "CHOL", "HDL", "LDLCALC", "TRIG", "CHOLHDL", "LDLDIRECT" in the last 72 hours. Thyroid Function Tests: No results for input(s): "TSH", "T4TOTAL", "FREET4", "T3FREE", "THYROIDAB" in the last 72 hours. Anemia Panel: No results for input(s): "VITAMINB12", "FOLATE", "FERRITIN", "TIBC", "IRON", "RETICCTPCT" in the last 72 hours. Sepsis Labs: No results for input(s): "PROCALCITON", "LATICACIDVEN" in the last 168 hours.  No  results found for this or any previous visit (from the past 240 hour(s)).    Radiology Studies: No results found.    Scheduled Meds:  Chlorhexidine Gluconate Cloth  6 each Topical Daily   enoxaparin (LOVENOX) injection  40 mg Subcutaneous Q24H   feeding supplement ([employer] STANDARD 1.4)  65 mL/hr Per Tube Q8H   free water  100 mL Per Tube Q4H   guaiFENesin  15 mL Per Tube Q6H   heparin lock flush  500 Units Intracatheter Q30 days   hydrOXYzine  50 mg Per Tube QHS   melatonin  5 mg Per Tube QHS   mouth rinse  15 mL Mouth Rinse Q2H   pantoprazole sodium  40 mg Per Tube Daily   polyethylene glycol  17 g Per Tube Daily   scopolamine  1 patch Transdermal Q72H   sodium chloride flush  10-40 mL Intracatheter Q12H   tobramycin (PF)  300 mg Nebulization BID   Continuous Infusions:  sodium chloride       LOS: 28 days     Dessa Phi, DO [facility] 01/06/2022, 1:43 PM   Available via Epic secure chat 7am-7pm After these hours, please refer to coverage provider listed on [email or web address]

## 2022-01-07 DIAGNOSIS — G1221 Amyotrophic lateral sclerosis: Secondary | ICD-10-CM | POA: Diagnosis not present

## 2022-01-07 LAB — GLUCOSE, CAPILLARY
Glucose-Capillary: 103 mg/dL — ABNORMAL HIGH (ref 70–99)
Glucose-Capillary: 108 mg/dL — ABNORMAL HIGH (ref 70–99)
Glucose-Capillary: 123 mg/dL — ABNORMAL HIGH (ref 70–99)
Glucose-Capillary: 129 mg/dL — ABNORMAL HIGH (ref 70–99)
Glucose-Capillary: 92 mg/dL (ref 70–99)
Glucose-Capillary: 94 mg/dL (ref 70–99)

## 2022-01-07 NOTE — TOC Progression Note (Signed)
Transition of Care [facility]) - Initial/Assessment Note    Patient Details  Name: Jerry [name] MRN: [id number] Date of Birth: 03/23/1971  Transition of Care [facility]) CM/SW Contact:    Jerry [name], [place] Phone Number: 01/07/2022, 12:40 PM  Clinical Narrative:                 CSW spoke with Jerry [name] at [facility].  Discharge is pending insurance authorization and facility bed availability.  TOC will continue to follow.  Expected Discharge Plan: [place] Barriers to Discharge: Continued Medical Work up   Patient Goals and CMS Choice Patient states their goals for this hospitalization and ongoing recovery are:: To return home CMS Medicare.gov Compare Post Acute Care list provided to:: Patient Represenative (must comment) (Wife, Jerry [name]) Choice offered to / list presented to : Patient, Spouse  Expected Discharge Plan and Services Expected Discharge Plan: [place]   Discharge Planning Services: CM Consult Post Acute Care Choice: Home Health, Durable Medical Equipment Living arrangements for the past 2 months: Single Family Home                 DME Arranged: Oxygen ([facility]) DME Agency: [employer] Date DME Agency Contacted: 12/15/21 Time DME Agency Contacted: [phone] Representative spoke with at DME Agency: Thedore Mins            Prior Living Arrangements/Services Living arrangements for the past 2 months: [place] Lives with:: Adult Children, Spouse Patient language and need for interpreter reviewed:: Yes Do you feel safe going back to the place where you live?: Yes      Need for Family Participation in Patient Care: Yes (Comment) Care giver support system in place?: Yes (comment) Current home services: DME Criminal Activity/Legal Involvement Pertinent to Current Situation/Hospitalization: No - Comment as needed  Activities of Daily Living   ADL Screening (condition at time of admission) Is the patient deaf or have difficulty  hearing?: No Does the patient have difficulty seeing, even when wearing glasses/contacts?: No Does the patient have difficulty concentrating, remembering, or making decisions?: No Patient able to express need for assistance with ADLs?: Yes Does the patient have difficulty dressing or bathing?: Yes Independently performs ADLs?: No Does the patient have difficulty walking or climbing stairs?: Yes Weakness of Legs: Both Weakness of Arms/Hands: Both  Permission Sought/Granted Permission sought to share information with : Case Manager, Customer service manager, Family Supports Permission granted to share information with : Yes, Verbal Permission Granted              Emotional Assessment Appearance:: Appears stated age Attitude/Demeanor/Rapport: Intubated (Following Commands or Not Following Commands) Affect (typically observed): Unable to Assess   Alcohol / Substance Use: Not Applicable Psych Involvement: No (comment)  Admission diagnosis:  Respiratory arrest ([place]) [R09.2] Hyponatremia [E87.1] Ileus ([place]) [K56.7] Septic shock ([place]) [A41.9, R65.21] Patient Active Problem List   Diagnosis Date Noted   Pseudomonas pneumonia ([place]) 01/06/2022   Autonomic dysfunction 01/06/2022   Ventilator dependent ([place]) 01/06/2022   Tracheostomy dependent ([place]) 01/06/2022   Pressure injury of skin 12/27/2021   Cardiac arrest ([place])    Acute hypoxemic respiratory failure ([place])    Septic shock ([place]) 12/09/2021   Anxiety 11/18/2021   Bed sore on buttock, right, unstageable ([place]) 11/18/2021   Severe protein-calorie malnutrition ([place]) 11/13/2021   Microscopic hematuria 10/29/2021   S/P percutaneous endoscopic gastrostomy (PEG) tube placement ([place]) 10/29/2021   ALS (amyotrophic lateral sclerosis) ([place]) 01/11/2019   PCP:  Ancil Boozer,  Drue Stager, MD Pharmacy:   [facility] [place], [address] [place]  [address] Phone: [phone] Fax: [phone]     Social Determinants of Health (SDOH) Interventions    Readmission Risk Interventions     No data to display

## 2022-01-07 NOTE — TOC Progression Note (Signed)
Transition of Care [facility]) - Progression Note    Patient Details  Name: Moss Berry MRN: [id number] Date of Birth: 1971/04/11  Transition [facility] Care [facility]) CM/SW Contact  Tom-Johnson, Renea Ee, RN Phone Number: 01/07/2022, 5:07 PM  Clinical Narrative:     CM notified by [facility] that insurance Josem Kaufmann is approved and a bed will be available tomorrow afternoon/evening. MD, wife  notified.   Expected Discharge Plan: [place] Barriers to Discharge: Continued Medical Work up  Expected Discharge Plan and Services Expected Discharge Plan: [place]   Discharge Planning Services: CM Consult Post Acute Care Choice: Home Health, Durable Medical Equipment Living arrangements for the past 2 months: Single Family Home                 DME Arranged: Oxygen ([facility]) DME Agency: [employer] Date DME Agency Contacted: 12/15/21 Time DME Agency Contacted: [phone] Representative spoke with at DME Agency: [place] ([place]) Interventions    Readmission Risk Interventions     No data to display

## 2022-01-07 NOTE — Progress Notes (Signed)
RT NOTE: RT at bedside with speech therapy for in-line passy-muir valve trial. RT slowly deflated cuff and decreased peep to 0 per speech therapy. Patient tolerated trial well and no complications. RT returned peep back to 5 after passy-muir valve trial and reinflated cuff. RT will continue to monitor.

## 2022-01-07 NOTE — Progress Notes (Signed)
Speech Language Pathology Treatment: Jerry [name] Speaking valve  Patient Details Name: Jerry [name] MRN: [id number] DOB: [birth year] Today's Date: 01/07/2022 Time: 1324-4010 SLP Time Calculation (min) (ACUTE ONLY): 17 min  Assessment / Plan / Recommendation Clinical Impression  In conjunction with RT, pt was active participant during therapy for inline speaking valve. His upper airway patent after cuff deflation, PEEP dropped to 0 by RT and he was able to clear pharyngeal secretions more timely today in beginning of session. Needed oral suctioning throughout session. Articulatory imprecision impacted short phrase and sentence intelligibility more today with reduced lingual ROM and strength for various phonemes and mildly decreased respiratory support. He used strategy independently several times throughout to pause, and inhale prior to single word output which increased SLP's deciphering of message and provided feedback and awareness. Vital signs were stable. Pt's sister Jerry [name] arrived and was able to hear his voice for first time since trach/vent. ST to continue - RT can place valve as well as needed/appropriate without ST present.    HPI HPI: Jerry [name] is a 51 yo male with hx significant for limb-onset ALS with progressive quadriparesis, anarthria and dysphagia (dx 2020). Admitted 6/7 with progressive respiratory decline, requiring urgent intubation. Brief code in ED s/p CPR 2 min. Trach and bronch 6/12. On ventilator. PEG 10/26/21 due to progressive dysphagia.  Pt participates in the ALS clinic at [facility].      SLP Plan  Continue with current plan of care      Recommendations for follow up therapy are one component of a multi-disciplinary discharge planning process, led by the attending physician.  Recommendations may be updated based on patient status, additional functional criteria and insurance authorization.    Recommendations         Patient may use Passy-Muir Speech Valve:   (with RT and ST or RT soley) PMSV Supervision: Full         Oral Care Recommendations: Oral care QID Follow Up Recommendations: Home health SLP Assistance recommended at discharge: Frequent or constant Supervision/Assistance SLP Visit Diagnosis: Aphonia (R49.1) Plan: Continue with current plan of care           Jerry [name]  01/07/2022, 11:41 AM

## 2022-01-07 NOTE — Progress Notes (Signed)
PROGRESS NOTE    Jerry [name]  PPI:[id number] DOB: [birth year] DOA: 12/09/2021 PCP: [name], MD     Brief Narrative:  51 y.o. male with past medical history significant for ALS presented to [facility] ED on 6/7 via EMS after being found by family altered, less responsive with oxygen saturation dropping to the 60s.  Patient is followed by [place] [facility] and now ALS has progressed to the point in which he is wheelchair-bound; in addition to increased respiratory distress over the last few weeks.  Recently had PEG tube placement.  On arrival to the emergency department he was intubated immediately in which she had a brief cardiac arrest in the ED at the time of intubation with recurrent cardiac arrest in the ICU on day of admission.   Significant Hospital events: 12/09/2021 intubated. Reportedly brief code in the ED with rapid response RN. Arrived in the ICU. Became hypotensive despite vasopressor support. Lost pulse again and CPR performed with ROSC. Bronchoscopy with erythematous airways with scant thin secretions 6/12 s/p trach 6/14 failed ATC after 10 minutes 6/16 bronchoscopy> thick mucus removed from RLL; culture sent> pseudomonas 6/17 started inhaled tobi, daily azithro 6/20 transferred from [facility] to the hospitalist service 6/22 Remains stable on minimal vent setting, no issues overnight  6/23 Stable on minimal vent settings, awaiting LTACH vs home w/ HH if able to obtain 24h nursing support, discussed with Dr. Lamonte Sakai this am, continue abx w/ azithromycin and nebulized tobramycin until follows up with Dr. [name] outpatient 6/24 remains stable, RT reports clogging of filters on vent tubing causing shortness of breath when utilizing tobramycin nebs; RT now reports changing filters after each neb treatment 6/26: peer to peer completed; approved for [name]; bed pending 7/5: insurance auth expired, awaiting reinstating auth and patient awaiting bed   New events last 24 hours /  Subjective: No acute events.  No new complaints today.  Assessment & Plan:   Principal Problem:   ALS (amyotrophic lateral sclerosis) (HCC) Active Problems:   S/P percutaneous endoscopic gastrostomy (PEG) tube placement (HCC)   Anxiety   Septic shock (HCC)   Cardiac arrest ([place])   Acute hypoxemic respiratory failure (HCC)   Pressure injury of skin   Pseudomonas pneumonia (HCC)   Autonomic dysfunction   Ventilator dependent (HCC)   Tracheostomy dependent (HCC)   Acute on chronic hypoxemic and hypercarbic respiratory failure secondary to progressive neuromuscular disease/ALS Patient presenting to ED via EMS after being found altered, with SPO2 in the 60s.  Etiology likely secondary to progressive neuromuscular disease with underlying ALS.  Patient was intubated on admission and likely now vent dependent given progression of ALS.  Patient underwent tracheostomy placement on 12/14/2021. -Trach/vent dependent -LTAC insurance auth and bed placement pending   Pseudomonas pneumonia -Septic shock at time of admission, admitted to ICU  -Now off antibiotics   Progressive ALS -Patient has been bedbound for the last year currently vent dependent.  Followed by [place] clinic. -Continue trach and PEG care   Autonomic dysfunction -Etiology likely secondary to his underlying progressive ALS.  Patient with mild hypotension, but MAP remain >65.  If maps fall below 65, can consider initiation of midodrine but will hold off on this for now as patient has no other endorgan damage and mentating appropriately   Insomnia, anxiety -Hydroxyzine nightly as needed and nightly  In agreement with assessment of the pressure ulcer as below:  Pressure Injury 12/26/21 Ear Right Stage 1 -  Intact skin  with non-blanchable redness of a localized area usually over a bony prominence. (Active)  12/26/21 2000  Location: Ear  Location Orientation: Right  Staging: Stage 1 -  Intact skin with  non-blanchable redness of a localized area usually over a bony prominence.  Wound Description (Comments):   Present on Admission:   Dressing Type Foam - Lift dressing to assess site every shift 01/07/22 1200    DVT prophylaxis:  enoxaparin (LOVENOX) injection 40 mg Start: 12/21/21 1145  Code Status: Full Family Communication: None at bedside  Disposition Plan:  Status is: Inpatient Remains inpatient appropriate because: Awaiting LTAC placement   Antimicrobials:  Anti-infectives (From admission, onward)    Start     Dose/Rate Route Frequency Ordered Stop   12/21/21 1000  azithromycin (ZITHROMAX) tablet 250 mg  Status:  Discontinued        250 mg Per Tube Daily 12/20/21 1141 01/04/22 1346   12/19/21 1145  azithromycin (ZITHROMAX) tablet 250 mg  Status:  Discontinued        250 mg Oral Daily 12/19/21 1051 12/20/21 1141   12/19/21 1145  tobramycin (PF) (TOBI) nebulizer solution 300 mg        300 mg Nebulization 2 times daily 12/19/21 1051     12/11/21 1100  piperacillin-tazobactam (ZOSYN) IVPB 3.375 g        3.375 g 12.5 mL/hr over 240 Minutes Intravenous Every 8 hours 12/11/21 1007 12/18/21 0916   12/09/21 0900  ampicillin-sulbactam (UNASYN) 1.5 g in sodium chloride 0.9 % 100 mL IVPB  Status:  Discontinued        1.5 g 200 mL/hr over 30 Minutes Intravenous Every 6 hours 12/09/21 0806 12/11/21 1007        Objective: Vitals:   01/07/22 1200 01/07/22 1215 01/07/22 1230 01/07/22 1245  BP: 99/69     Pulse: 69 71 70 78  Resp: '18 18 18 20  '$ Temp:      TempSrc:      SpO2: 100% 100% 96% 100%  Weight:        Intake/Output Summary (Last 24 hours) at 01/07/2022 1252 Last data filed at 01/07/2022 1200 Gross per 24 hour  Intake 1725 ml  Output 1365 ml  Net 360 ml    Filed Weights   12/30/21 0400 01/05/22 0500 01/06/22 0404  Weight: 54.6 kg 54.9 kg 55.1 kg    Examination:  General exam: Appears calm and comfortable  Respiratory system: Clear to auscultation anteriorly,  vent/trach  Cardiovascular system: S1 & S2 heard Gastrointestinal system: Abdomen is nondistended, soft Central nervous system: Alert    Data Reviewed: I have personally reviewed following labs and imaging studies  CBC: Recent Labs  Lab 01/06/22 0324  WBC 4.3  HGB 9.3*  HCT 28.9*  MCV 94.1  PLT 528    Basic Metabolic Panel: Recent Labs  Lab 01/06/22 0324  NA 141  K 3.9  CL 107  CO2 25  GLUCOSE 93  BUN 12  CREATININE <0.30*  CALCIUM 9.2    GFR: CrCl cannot be calculated (This lab value cannot be used to calculate CrCl because it is not a number: <0.30). Liver Function Tests: Recent Labs  Lab 01/06/22 0324  AST 17  ALT 22  ALKPHOS 70  BILITOT 0.6  PROT 6.1*  ALBUMIN 2.9*    No results for input(s): "LIPASE", "AMYLASE" in the last 168 hours. No results for input(s): "AMMONIA" in the last 168 hours. Coagulation Profile: No results for input(s): "INR", "PROTIME" in the last  168 hours. Cardiac Enzymes: No results for input(s): "CKTOTAL", "CKMB", "CKMBINDEX", "TROPONINI" in the last 168 hours. BNP (last 3 results) No results for input(s): "PROBNP" in the last 8760 hours. HbA1C: No results for input(s): "HGBA1C" in the last 72 hours. CBG: Recent Labs  Lab 01/06/22 1549 01/06/22 1936 01/06/22 2327 01/07/22 0348 01/07/22 0739  GLUCAP 106* 91 93 123* 108*    Lipid Profile: No results for input(s): "CHOL", "HDL", "LDLCALC", "TRIG", "CHOLHDL", "LDLDIRECT" in the last 72 hours. Thyroid Function Tests: No results for input(s): "TSH", "T4TOTAL", "FREET4", "T3FREE", "THYROIDAB" in the last 72 hours. Anemia Panel: No results for input(s): "VITAMINB12", "FOLATE", "FERRITIN", "TIBC", "IRON", "RETICCTPCT" in the last 72 hours. Sepsis Labs: No results for input(s): "PROCALCITON", "LATICACIDVEN" in the last 168 hours.  No results found for this or any previous visit (from the past 240 hour(s)).    Radiology Studies: No results found.    Scheduled Meds:   Chlorhexidine Gluconate Cloth  6 each Topical Daily   enoxaparin (LOVENOX) injection  40 mg Subcutaneous Q24H   feeding supplement ([employer] STANDARD 1.4)  65 mL/hr Per Tube Q8H   free water  100 mL Per Tube Q4H   guaiFENesin  15 mL Per Tube Q6H   heparin lock flush  500 Units Intracatheter Q30 days   hydrOXYzine  50 mg Per Tube QHS   melatonin  5 mg Per Tube QHS   mouth rinse  15 mL Mouth Rinse Q2H   pantoprazole sodium  40 mg Per Tube Daily   polyethylene glycol  17 g Per Tube Daily   scopolamine  1 patch Transdermal Q72H   sodium chloride flush  10-40 mL Intracatheter Q12H   tobramycin (PF)  300 mg Nebulization BID   Continuous Infusions:  sodium chloride       LOS: 29 days     Dessa Phi, DO [facility] 01/07/2022, 12:52 PM   Available via Epic secure chat 7am-7pm After these hours, please refer to coverage provider listed on [email or web address]

## 2022-01-08 ENCOUNTER — Other Ambulatory Visit (HOSPITAL_COMMUNITY): Payer: Self-pay

## 2022-01-08 ENCOUNTER — Inpatient Hospital Stay
Admit: 2022-01-08 | Discharge: 2022-05-19 | Disposition: A | Discharge status: Other Acute Inpatient Hospital | Payer: Medicare HMO | Source: Other Acute Inpatient Hospital

## 2022-01-08 DIAGNOSIS — R1310 Dysphagia, unspecified: Secondary | ICD-10-CM | POA: Diagnosis not present

## 2022-01-08 DIAGNOSIS — J9621 Acute and chronic respiratory failure with hypoxia: Secondary | ICD-10-CM | POA: Diagnosis not present

## 2022-01-08 DIAGNOSIS — J9622 Acute and chronic respiratory failure with hypercapnia: Secondary | ICD-10-CM | POA: Diagnosis not present

## 2022-01-08 DIAGNOSIS — Z452 Encounter for adjustment and management of vascular access device: Secondary | ICD-10-CM | POA: Diagnosis not present

## 2022-01-08 DIAGNOSIS — E46 Unspecified protein-calorie malnutrition: Secondary | ICD-10-CM | POA: Diagnosis not present

## 2022-01-08 DIAGNOSIS — J95851 Ventilator associated pneumonia: Secondary | ICD-10-CM | POA: Diagnosis not present

## 2022-01-08 DIAGNOSIS — I469 Cardiac arrest, cause unspecified: Secondary | ICD-10-CM | POA: Diagnosis not present

## 2022-01-08 DIAGNOSIS — K567 Ileus, unspecified: Secondary | ICD-10-CM | POA: Diagnosis present

## 2022-01-08 DIAGNOSIS — N200 Calculus of kidney: Secondary | ICD-10-CM | POA: Diagnosis not present

## 2022-01-08 DIAGNOSIS — G1221 Amyotrophic lateral sclerosis: Secondary | ICD-10-CM | POA: Diagnosis not present

## 2022-01-08 DIAGNOSIS — K56609 Unspecified intestinal obstruction, unspecified as to partial versus complete obstruction: Secondary | ICD-10-CM | POA: Diagnosis not present

## 2022-01-08 DIAGNOSIS — Z430 Encounter for attention to tracheostomy: Secondary | ICD-10-CM | POA: Diagnosis not present

## 2022-01-08 DIAGNOSIS — R404 Transient alteration of awareness: Secondary | ICD-10-CM | POA: Diagnosis not present

## 2022-01-08 DIAGNOSIS — J181 Lobar pneumonia, unspecified organism: Secondary | ICD-10-CM | POA: Diagnosis not present

## 2022-01-08 DIAGNOSIS — E43 Unspecified severe protein-calorie malnutrition: Secondary | ICD-10-CM | POA: Diagnosis not present

## 2022-01-08 DIAGNOSIS — J189 Pneumonia, unspecified organism: Secondary | ICD-10-CM | POA: Diagnosis not present

## 2022-01-08 DIAGNOSIS — R140 Abdominal distension (gaseous): Secondary | ICD-10-CM | POA: Diagnosis not present

## 2022-01-08 DIAGNOSIS — J9811 Atelectasis: Secondary | ICD-10-CM | POA: Diagnosis not present

## 2022-01-08 DIAGNOSIS — D638 Anemia in other chronic diseases classified elsewhere: Secondary | ICD-10-CM | POA: Diagnosis not present

## 2022-01-08 DIAGNOSIS — R6521 Severe sepsis with septic shock: Secondary | ICD-10-CM | POA: Diagnosis not present

## 2022-01-08 DIAGNOSIS — G909 Disorder of the autonomic nervous system, unspecified: Secondary | ICD-10-CM | POA: Diagnosis present

## 2022-01-08 DIAGNOSIS — J90 Pleural effusion, not elsewhere classified: Secondary | ICD-10-CM | POA: Diagnosis not present

## 2022-01-08 DIAGNOSIS — R531 Weakness: Secondary | ICD-10-CM | POA: Diagnosis not present

## 2022-01-08 DIAGNOSIS — R059 Cough, unspecified: Secondary | ICD-10-CM | POA: Diagnosis not present

## 2022-01-08 DIAGNOSIS — F419 Anxiety disorder, unspecified: Secondary | ICD-10-CM | POA: Diagnosis not present

## 2022-01-08 DIAGNOSIS — J151 Pneumonia due to Pseudomonas: Secondary | ICD-10-CM | POA: Diagnosis not present

## 2022-01-08 DIAGNOSIS — Z4682 Encounter for fitting and adjustment of non-vascular catheter: Secondary | ICD-10-CM | POA: Diagnosis not present

## 2022-01-08 DIAGNOSIS — L89151 Pressure ulcer of sacral region, stage 1: Secondary | ICD-10-CM | POA: Diagnosis not present

## 2022-01-08 DIAGNOSIS — K5939 Other megacolon: Secondary | ICD-10-CM | POA: Diagnosis not present

## 2022-01-08 DIAGNOSIS — R0602 Shortness of breath: Secondary | ICD-10-CM | POA: Diagnosis not present

## 2022-01-08 DIAGNOSIS — J9610 Chronic respiratory failure, unspecified whether with hypoxia or hypercapnia: Secondary | ICD-10-CM | POA: Diagnosis not present

## 2022-01-08 DIAGNOSIS — Z931 Gastrostomy status: Secondary | ICD-10-CM | POA: Diagnosis not present

## 2022-01-08 DIAGNOSIS — K560 Paralytic ileus: Secondary | ICD-10-CM | POA: Diagnosis not present

## 2022-01-08 DIAGNOSIS — K6389 Other specified diseases of intestine: Secondary | ICD-10-CM | POA: Diagnosis not present

## 2022-01-08 DIAGNOSIS — Z431 Encounter for attention to gastrostomy: Secondary | ICD-10-CM | POA: Diagnosis not present

## 2022-01-08 DIAGNOSIS — R600 Localized edema: Secondary | ICD-10-CM | POA: Diagnosis not present

## 2022-01-08 DIAGNOSIS — J9690 Respiratory failure, unspecified, unspecified whether with hypoxia or hypercapnia: Secondary | ICD-10-CM | POA: Diagnosis not present

## 2022-01-08 DIAGNOSIS — R509 Fever, unspecified: Secondary | ICD-10-CM | POA: Diagnosis not present

## 2022-01-08 DIAGNOSIS — J984 Other disorders of lung: Secondary | ICD-10-CM | POA: Diagnosis not present

## 2022-01-08 DIAGNOSIS — J9620 Acute and chronic respiratory failure, unspecified whether with hypoxia or hypercapnia: Secondary | ICD-10-CM | POA: Diagnosis not present

## 2022-01-08 DIAGNOSIS — Z681 Body mass index (BMI) 19 or less, adult: Secondary | ICD-10-CM | POA: Diagnosis not present

## 2022-01-08 DIAGNOSIS — R918 Other nonspecific abnormal finding of lung field: Secondary | ICD-10-CM | POA: Diagnosis not present

## 2022-01-08 DIAGNOSIS — A419 Sepsis, unspecified organism: Secondary | ICD-10-CM | POA: Diagnosis not present

## 2022-01-08 DIAGNOSIS — M47816 Spondylosis without myelopathy or radiculopathy, lumbar region: Secondary | ICD-10-CM | POA: Diagnosis not present

## 2022-01-08 DIAGNOSIS — E440 Moderate protein-calorie malnutrition: Secondary | ICD-10-CM | POA: Diagnosis not present

## 2022-01-08 DIAGNOSIS — I878 Other specified disorders of veins: Secondary | ICD-10-CM | POA: Diagnosis not present

## 2022-01-08 DIAGNOSIS — Z9911 Dependence on respirator [ventilator] status: Secondary | ICD-10-CM | POA: Diagnosis not present

## 2022-01-08 DIAGNOSIS — Z8719 Personal history of other diseases of the digestive system: Secondary | ICD-10-CM | POA: Diagnosis not present

## 2022-01-08 DIAGNOSIS — Z743 Need for continuous supervision: Secondary | ICD-10-CM | POA: Diagnosis not present

## 2022-01-08 DIAGNOSIS — E119 Type 2 diabetes mellitus without complications: Secondary | ICD-10-CM | POA: Diagnosis not present

## 2022-01-08 LAB — GLUCOSE, CAPILLARY
Glucose-Capillary: 120 mg/dL — ABNORMAL HIGH (ref 70–99)
Glucose-Capillary: 104 mg/dL — ABNORMAL HIGH (ref 70–99)
Glucose-Capillary: 109 mg/dL — ABNORMAL HIGH (ref 70–99)
Glucose-Capillary: 102 mg/dL — ABNORMAL HIGH (ref 70–99)

## 2022-01-08 MED ORDER — DIATRIZOATE MEGLUMINE & SODIUM 66-10 % PO SOLN
ORAL | Status: AC
  Administered 2022-01-08: 30 mL via GASTROSTOMY
  Filled 2022-01-08: qty 30

## 2022-01-08 MED ORDER — BACLOFEN 5 MG PO TABS: 5.0000 mg | ORAL_TABLET | Freq: Three times a day (TID) | ORAL | Status: DC | PRN

## 2022-01-08 MED ORDER — MELATONIN 5 MG PO TABS: 5.0000 mg | ORAL_TABLET | Freq: Every day | ORAL | 0 refills | Status: DC

## 2022-01-08 MED ORDER — SCOPOLAMINE 1 MG/3DAYS TD PT72: 1.0000 | MEDICATED_PATCH | TRANSDERMAL | 0 refills | Status: DC

## 2022-01-08 MED ORDER — KATE FARMS STANDARD 1.4 PO LIQD: 65.0000 mL/h | Freq: Three times a day (TID) | ORAL | Status: DC

## 2022-01-08 MED ORDER — ENOXAPARIN SODIUM 40 MG/0.4ML IJ SOSY: 40.0000 mg | PREFILLED_SYRINGE | INTRAMUSCULAR | Status: DC

## 2022-01-08 MED ORDER — HYDROXYZINE HCL 25 MG PO TABS
25.0000 mg | ORAL_TABLET | Freq: Two times a day (BID) | ORAL | 0 refills | Status: DC | PRN
Start: 2022-01-08 — End: 2022-07-02

## 2022-01-08 MED ORDER — TOBRAMYCIN 300 MG/5ML IN NEBU: 300.0000 mg | INHALATION_SOLUTION | Freq: Two times a day (BID) | RESPIRATORY_TRACT | 0 refills | Status: DC

## 2022-01-08 MED ORDER — HYDROXYZINE HCL 50 MG PO TABS: 50.0000 mg | ORAL_TABLET | Freq: Every day | ORAL | 0 refills | Status: DC

## 2022-01-08 MED ORDER — PANTOPRAZOLE SODIUM 40 MG PO PACK: 40.0000 mg | PACK | Freq: Every day | ORAL | Status: DC

## 2022-01-08 NOTE — TOC Transition Note (Signed)
Transition of Care [facility]) - CM/SW Discharge Note   Patient Details  Name: Jerry [name] MRN: [id number] Date of Birth: 04/21/1971  Transition of Care [facility]) CM/SW Contact:  Tom-Johnson, Renea Ee, RN Phone Number: 01/08/2022, 12:59 PM   Clinical Narrative:     Patient is scheduled for discharge today to [place], [place] room 5E30. No further TOC needs noted.   Final next level of care: Long Term Acute Care (LTAC) Barriers to Discharge: Barriers Resolved   Patient Goals and CMS Choice Patient states their goals for this hospitalization and ongoing recovery are:: To go to [facility], get better and return home. CMS Medicare.gov Compare Post Acute Care list provided to:: Patient Choice offered to / list presented to : Patient, Spouse Fredda Hammed)  Discharge Placement                Patient to be transferred to facility by: Hospitalbed. In-hospital transfer.      Discharge Plan and Services   Discharge Planning Services: CM Consult Post Acute Care Choice: Home Health, Durable Medical Equipment          DME Arranged: Oxygen ([facility]) DME Agency: [employer] Date DME Agency Contacted: 12/15/21 Time DME Agency Contacted: [phone] Representative spoke with at DME Agency: [place] Determinants of Health ([place]) Interventions     Readmission Risk Interventions     No data to display

## 2022-01-08 NOTE — Discharge Summary (Signed)
Physician Discharge Summary  Jerry [name] WCB:[id number] DOB: 1971/02/03 DOA: 12/09/2021  PCP: [name], MD  Admit date: 12/09/2021 Discharge date: 01/08/2022  Admitted From: Home Disposition:  LTACH   Discharge Condition: Stable CODE STATUS: Full  Diet recommendation: Tube feed via PEG   Brief/Interim Summary: Jerry [name] is a 51 y.o. male with past medical history significant for ALS presented to [facility] ED on 6/7 via EMS after being found by family altered, less responsive with oxygen saturation dropping to the 60s.  Patient is followed by [facility] and now ALS has progressed to the point in which he is wheelchair-bound; in addition to increased respiratory distress over the last few weeks.  Recently had PEG tube placement.  On arrival to the emergency department he was intubated immediately in which she had a brief cardiac arrest in the ED at the time of intubation with recurrent cardiac arrest in the ICU on day of admission.   Significant Hospital events: 12/09/2021 intubated. Reportedly brief code in the ED with rapid response RN. Arrived in the ICU. Became hypotensive despite vasopressor support. Lost pulse again and CPR performed with ROSC. Bronchoscopy with erythematous airways with scant thin secretions 6/12 s/p trach 6/14 failed ATC after 10 minutes 6/16 bronchoscopy> thick mucus removed from RLL; culture sent> pseudomonas 6/17 started inhaled tobi, daily azithro 6/20 transferred from [facility] to the hospitalist service 6/22 Remains stable on minimal vent setting, no issues overnight  6/23 Stable on minimal vent settings, awaiting LTACH vs home w/ HH if able to obtain 24h nursing support, discussed with Dr. Lamonte Sakai this am, continue abx w/ azithromycin and nebulized tobramycin until follows up with Dr. [name] outpatient 6/24 remains stable, RT reports clogging of filters on vent tubing causing shortness of breath when utilizing tobramycin nebs; RT now reports changing  filters after each neb treatment 6/26: peer to peer completed; approved for [name]; bed pending 7/5: insurance auth expired, awaiting reinstating auth and patient awaiting bed  7/7: discharged to [facility]  Discharge Diagnoses:   Principal Problem:   ALS (amyotrophic lateral sclerosis) ([place]) Active Problems:   S/P percutaneous endoscopic gastrostomy (PEG) tube placement ([place])   Anxiety   Septic shock ([place])   Cardiac arrest ([place])   Acute hypoxemic respiratory failure ([place])   Pressure injury of skin   Pseudomonas pneumonia ([place])   Autonomic dysfunction   Ventilator dependent ([place])   Tracheostomy dependent ([place])   Acute on chronic hypoxemic and hypercarbic respiratory failure secondary to progressive neuromuscular disease/ALS Patient presenting to ED via EMS after being found altered, with SPO2 in the 60s.  Etiology likely secondary to progressive neuromuscular disease with underlying ALS.  Patient was intubated on admission and likely now vent dependent given progression of ALS.  Patient underwent tracheostomy placement on 12/14/2021. -Trach/vent dependent   Pseudomonas pneumonia -Septic shock at time of admission, admitted to ICU  -Now off antibiotics   Progressive ALS -Patient has been bedbound for the last year currently vent dependent.  Followed by [facility]. -Continue trach and PEG care   Autonomic dysfunction -Etiology likely secondary to his underlying progressive ALS.  Patient with mild hypotension, but MAP remain >65.  If maps fall below 65, can consider initiation of midodrine but will hold off on this for now as patient has no other endorgan damage and mentating appropriately   Insomnia, anxiety -Hydroxyzine nightly as needed and nightly  In agreement with assessment of the pressure ulcer as below:  Pressure Injury  12/26/21 Ear Right Stage 1 -  Intact skin with non-blanchable redness of a localized area usually over a bony prominence. (Active)   12/26/21 2000  Location: Ear  Location Orientation: Right  Staging: Stage 1 -  Intact skin with non-blanchable redness of a localized area usually over a bony prominence.  Wound Description (Comments):   Present on Admission:   Dressing Type Foam - Lift dressing to assess site every shift 01/08/22 0800    Nutrition Problem: Inadequate oral intake Etiology: inability to eat  Discharge Instructions  Discharge Instructions     No wound care   Complete by: As directed       Allergies as of 01/08/2022       Reactions   Crab [shellfish Allergy] Rash        Medication List     STOP taking these medications    EPINEPHrine 0.3 mg/0.3 mL Soaj injection Commonly known as: EpiPen 2-Pak   hydrOXYzine 10 MG/5ML syrup Commonly known as: ATARAX Replaced by: hydrOXYzine 50 MG tablet   LORazepam 2 MG/ML concentrated solution Commonly known as: LORazepam Intensol   morphine CONCENTRATE 10 mg / 0.5 ml concentrated solution       TAKE these medications    Baclofen 5 MG Tabs Place 5 mg into feeding tube 3 (three) times daily as needed for muscle spasms.   bisacodyl 10 MG suppository Commonly known as: DULCOLAX Place 10 mg rectally daily as needed for severe constipation.   enoxaparin 40 MG/0.4ML injection Commonly known as: LOVENOX Inject 0.4 mLs (40 mg total) into the skin daily. DVT prophylaxis   feeding supplement (KATE FARMS STANDARD 1.4) Liqd liquid Place 65 mL/hr into feeding tube every 8 (eight) hours.   hydrOXYzine 50 MG tablet Commonly known as: ATARAX Place 1 tablet (50 mg total) into feeding tube at bedtime. Replaces: hydrOXYzine 10 MG/5ML syrup   hydrOXYzine 25 MG tablet Commonly known as: ATARAX Place 1 tablet (25 mg total) into feeding tube 2 (two) times daily as needed for anxiety.   melatonin 5 MG Tabs Place 1 tablet (5 mg total) into feeding tube at bedtime.   pantoprazole sodium 40 mg Commonly known as: PROTONIX Place 40 mg into feeding tube  daily.   polyethylene glycol powder 17 GM/SCOOP powder Commonly known as: GLYCOLAX/MIRALAX Place 17 g into feeding tube daily as needed for constipation.   scopolamine 1 MG/3DAYS Commonly known as: TRANSDERM-SCOP Place 1 patch (1.5 mg total) onto the skin every 3 (three) days. Start taking on: January 11, 2022   Senna 8.8 MG/5ML Liqd Take 10 mLs by mouth daily as needed for constipation.   tobramycin (PF) 300 MG/5ML nebulizer solution Commonly known as: TOBI Take 5 mLs (300 mg total) by nebulization 2 (two) times daily.        Allergies  Allergen Reactions   Crab [Shellfish Allergy] Rash     Procedures/Studies: DG CHEST PORT 1 VIEW  Result Date: 12/26/2021 CLINICAL DATA:  Shortness of breath EXAM: PORTABLE CHEST 1 VIEW COMPARISON:  12/18/2021 FINDINGS: Unchanged AP portable examination. Tracheostomy. Right chest port catheter. No acute abnormality of the lungs. Heart and mediastinum are normal. IMPRESSION: Unchanged AP portable examination. Tracheostomy. No acute abnormality of the lungs. Electronically Signed   By: Delanna Ahmadi M.D.   On: 12/26/2021 13:56   DG CHEST PORT 1 VIEW  Result Date: 12/18/2021 CLINICAL DATA:  Acute hypercapnic respiratory failure. EXAM: PORTABLE CHEST 1 VIEW COMPARISON:  Chest radiograph 12/16/2021 FINDINGS: The patient is mildly rotated to  the right. Tracheostomy tube overlies the airway. Right jugular Port-A-Cath terminates over the high right atrium, unchanged. The cardiomediastinal silhouette is unchanged. Lung volumes remain low with unchanged hazy opacity in the right lung base. The left lung remains clear. No pneumothorax is identified. IMPRESSION: Unchanged hazy right lung base opacity which could reflect a small pleural effusion and atelectasis or infection. Electronically Signed   By: Logan Bores M.D.   On: 12/18/2021 08:26   DG CHEST PORT 1 VIEW  Result Date: 12/16/2021 CLINICAL DATA:  Respiratory failure EXAM: PORTABLE CHEST 1 VIEW  COMPARISON:  Chest x-ray dated December 14, 2021 FINDINGS: Cardiac and mediastinal contours are unchanged. Tracheostomy tube and right chest wall port are unchanged in position. Unchanged opacity of the lower right lung, possibly due to atelectasis or small layering pleural effusion. No evidence of pneumothorax. IMPRESSION: Unchanged opacity of the lower right lung, possibly due to atelectasis or small layering pleural effusion. Electronically Signed   By: Yetta Glassman M.D.   On: 12/16/2021 13:41   DG Chest Port 1 View  Result Date: 12/14/2021 CLINICAL DATA:  [id number], status post tracheostomy EXAM: PORTABLE CHEST 1 VIEW COMPARISON:  December 09, 2021 FINDINGS: Again seen is the right transjugular double-lumen Port-A-Cath with its tip at the caval atrial junction. There has been interval tracheostomy tube insertion and is in satisfactory position. Heart and mediastinum have a normal appearance. No focal consolidation, pneumothorax or vascular congestion. Right lateral hemidiaphragm is obliterated, which may be due to small pleural effusion. IMPRESSION: There has been interval tracheostomy tube insertion and the tube is in satisfactory position. Right lateral diaphragm is obliterated which may be due to small pleural effusion. Electronically Signed   By: Frazier Richards M.D.   On: 12/14/2021 15:28   DG Abd 1 View  Result Date: 12/12/2021 CLINICAL DATA:  Abdominal distention. EXAM: ABDOMEN - 1 VIEW COMPARISON:  Scout film from CT scan 12/09/2021 FINDINGS: Diffuse gaseous distention of small bowel and colon again noted with probable decrease in colonic gas. NG tube is noted in the stomach. Bones are diffusely demineralized. IMPRESSION: Diffuse gaseous distention of small bowel and colon with interval decrease in colonic gas since 12/09/2021. Imaging features suggest ileus. Electronically Signed   By: Misty Stanley M.D.   On: 12/12/2021 11:09   ECHOCARDIOGRAM COMPLETE  Result Date: 12/10/2021    ECHOCARDIOGRAM REPORT    Patient Name:   CONSTANTINO STARACE Date of Exam: 12/10/2021 Medical Rec #:  [id number]    Height:       65.0 in Accession #:    [id number]   Weight:       94.0 lb Date of Birth:  Jul 14, 1970    BSA:          1.435 m Patient Age:    27 years     BP:           98/68 mmHg Patient Gender: M            HR:           88 bpm. Exam Location:  Inpatient Procedure: 2D Echo, Cardiac Doppler and Color Doppler Indications:    Cardiac arrest I46.9  History:        Patient has no prior history of Echocardiogram examinations.  Sonographer:    Bernadene Person RDCS Referring Phys: [id number] CHI JANE ELLISON  Sonographer Comments: Echo performed with patient supine and on artificial respirator and no subcostal window. IMPRESSIONS  1. Mild intracavitary gradient. Peak velocity 1.2 m/s. Peak  gradient 5.9 mmHg. Left ventricular ejection fraction, by estimation, is 60 to 65%. The left ventricle has normal function. The left ventricle has no regional wall motion abnormalities. Left ventricular diastolic parameters are consistent with Grade I diastolic dysfunction (impaired relaxation).  2. Right ventricular systolic function is normal. The right ventricular size is normal. There is normal pulmonary artery systolic pressure.  3. The mitral valve is normal in structure. No evidence of mitral valve regurgitation. No evidence of mitral stenosis.  4. The aortic valve is tricuspid. Aortic valve regurgitation is not visualized. No aortic stenosis is present.  5. Aortic dilatation noted. There is borderline dilatation of the aortic root, measuring 36 mm.  6. The inferior vena cava is normal in size with greater than 50% respiratory variability, suggesting right atrial pressure of 3 mmHg. FINDINGS  Left Ventricle: Mild intracavitary gradient. Peak velocity 1.2 m/s. Peak gradient 5.9 mmHg. Left ventricular ejection fraction, by estimation, is 60 to 65%. The left ventricle has normal function. The left ventricle has no regional wall motion abnormalities. The  left ventricular internal cavity size was normal in size. There is no left ventricular hypertrophy. Left ventricular diastolic parameters are consistent with Grade I diastolic dysfunction (impaired relaxation). Indeterminate filling pressures. Right Ventricle: The right ventricular size is normal. No increase in right ventricular wall thickness. Right ventricular systolic function is normal. There is normal pulmonary artery systolic pressure. The tricuspid regurgitant velocity is 2.17 m/s, and  with an assumed right atrial pressure of 3 mmHg, the estimated right ventricular systolic pressure is 40.1 mmHg. Left Atrium: Left atrial size was normal in size. Right Atrium: Right atrial size was normal in size. Pericardium: There is no evidence of pericardial effusion. Mitral Valve: The mitral valve is normal in structure. No evidence of mitral valve regurgitation. No evidence of mitral valve stenosis. Tricuspid Valve: The tricuspid valve is normal in structure. Tricuspid valve regurgitation is trivial. No evidence of tricuspid stenosis. Aortic Valve: The aortic valve is tricuspid. Aortic valve regurgitation is not visualized. No aortic stenosis is present. Pulmonic Valve: The pulmonic valve was normal in structure. Pulmonic valve regurgitation is trivial. No evidence of pulmonic stenosis. Aorta: Aortic dilatation noted. There is borderline dilatation of the aortic root, measuring 36 mm. Venous: The inferior vena cava is normal in size with greater than 50% respiratory variability, suggesting right atrial pressure of 3 mmHg. IAS/Shunts: No atrial level shunt detected by color flow Doppler.  LEFT VENTRICLE PLAX 2D LVIDd:         3.40 cm   Diastology LVIDs:         2.50 cm   LV e' medial:    5.02 cm/s LV PW:         1.00 cm   LV E/e' medial:  11.3 LV IVS:        0.80 cm   LV e' lateral:   10.20 cm/s LVOT diam:     2.10 cm   LV E/e' lateral: 5.6 LV SV:         59 LV SV Index:   41 LVOT Area:     3.46 cm  RIGHT VENTRICLE RV  S prime:     15.70 cm/s TAPSE (M-mode): 1.8 cm LEFT ATRIUM           Index       RIGHT ATRIUM          Index LA diam:      2.20 cm 1.53 cm/m  RA Area:  7.62 cm LA Vol (A2C): 11.0 ml 7.66 ml/m  RA Volume:   16.00 ml 11.15 ml/m LA Vol (A4C): 11.8 ml 8.22 ml/m  AORTIC VALVE LVOT Vmax:   66.10 cm/s LVOT Vmean:  51.300 cm/s LVOT VTI:    0.171 m  AORTA Ao Root diam: 3.60 cm Ao Asc diam:  3.60 cm MITRAL VALVE               TRICUSPID VALVE MV Area (PHT): 3.42 cm    TR Peak grad:   18.8 mmHg MV Decel Time: 222 msec    TR Vmax:        217.00 cm/s MV E velocity: 56.80 cm/s MV A velocity: 43.00 cm/s  SHUNTS MV E/A ratio:  1.32        Systemic VTI:  0.17 m                            Systemic Diam: 2.10 cm Skeet Latch MD Electronically signed by Skeet Latch MD Signature Date/Time: 12/10/2021/5:46:37 PM    Final        Discharge Exam: Vitals:   01/08/22 0945 01/08/22 1000  BP:  102/68  Pulse: 65 69  Resp: 20 18  Temp:    SpO2: 100% 100%     General: Pt is alert, awake, not in acute distress Cardiovascular: RRR, S1/S2 + Respiratory: CTA bilaterally, no wheezing, +trach vent  Abdominal: Soft, NT Psych: Normal mood and affect    The results of significant diagnostics from this hospitalization (including imaging, microbiology, ancillary and laboratory) are listed below for reference.     Microbiology: No results found for this or any previous visit (from the past 240 hour(s)).   Labs: BNP (last 3 results) No results for input(s): "BNP" in the last 8760 hours. Basic Metabolic Panel: Recent Labs  Lab 01/06/22 0324  NA 141  K 3.9  CL 107  CO2 25  GLUCOSE 93  BUN 12  CREATININE <0.30*  CALCIUM 9.2   Liver Function Tests: Recent Labs  Lab 01/06/22 0324  AST 17  ALT 22  ALKPHOS 70  BILITOT 0.6  PROT 6.1*  ALBUMIN 2.9*   No results for input(s): "LIPASE", "AMYLASE" in the last 168 hours. No results for input(s): "AMMONIA" in the last 168 hours. CBC: Recent Labs  Lab  01/06/22 0324  WBC 4.3  HGB 9.3*  HCT 28.9*  MCV 94.1  PLT 296   Cardiac Enzymes: No results for input(s): "CKTOTAL", "CKMB", "CKMBINDEX", "TROPONINI" in the last 168 hours. BNP: Invalid input(s): "POCBNP" CBG: Recent Labs  Lab 01/07/22 1522 01/07/22 1950 01/07/22 2334 01/08/22 0300 01/08/22 0741  GLUCAP 92 103* 94 120* 104*   D-Dimer No results for input(s): "DDIMER" in the last 72 hours. Hgb A1c No results for input(s): "HGBA1C" in the last 72 hours. Lipid Profile No results for input(s): "CHOL", "HDL", "LDLCALC", "TRIG", "CHOLHDL", "LDLDIRECT" in the last 72 hours. Thyroid function studies No results for input(s): "TSH", "T4TOTAL", "T3FREE", "THYROIDAB" in the last 72 hours.  Invalid input(s): "FREET3" Anemia work up No results for input(s): "VITAMINB12", "FOLATE", "FERRITIN", "TIBC", "IRON", "RETICCTPCT" in the last 72 hours. Urinalysis    Component Value Date/Time   COLORURINE YELLOW 12/09/2021 0936   APPEARANCEUR HAZY (A) 12/09/2021 0936   LABSPEC 1.026 12/09/2021 0936   PHURINE 6.0 12/09/2021 0936   GLUCOSEU NEGATIVE 12/09/2021 0936   HGBUR SMALL (A) 12/09/2021 [place] NEGATIVE 12/09/2021 9417   Benjamin Stain  20 (A) 12/09/2021 0936   PROTEINUR NEGATIVE 12/09/2021 0936   NITRITE NEGATIVE 12/09/2021 0936   LEUKOCYTESUR NEGATIVE 12/09/2021 0936   Sepsis Labs Recent Labs  Lab 01/06/22 0324  WBC 4.3   Microbiology No results found for this or any previous visit (from the past 240 hour(s)).   Patient was seen and examined on the day of discharge and was found to be in stable condition. Time coordinating discharge: 40 minutes including assessment and coordination of care, as well as examination of the patient.   SIGNED:  Dessa Phi, DO [facility] 01/08/2022, 10:58 AM

## 2022-01-09 ENCOUNTER — Other Ambulatory Visit (HOSPITAL_COMMUNITY): Payer: Self-pay

## 2022-01-09 DIAGNOSIS — J9811 Atelectasis: Secondary | ICD-10-CM | POA: Diagnosis not present

## 2022-01-09 DIAGNOSIS — E46 Unspecified protein-calorie malnutrition: Secondary | ICD-10-CM | POA: Diagnosis not present

## 2022-01-09 DIAGNOSIS — E119 Type 2 diabetes mellitus without complications: Secondary | ICD-10-CM | POA: Diagnosis not present

## 2022-01-09 DIAGNOSIS — R0602 Shortness of breath: Secondary | ICD-10-CM | POA: Diagnosis not present

## 2022-01-09 DIAGNOSIS — J9620 Acute and chronic respiratory failure, unspecified whether with hypoxia or hypercapnia: Secondary | ICD-10-CM | POA: Diagnosis not present

## 2022-01-09 DIAGNOSIS — L89151 Pressure ulcer of sacral region, stage 1: Secondary | ICD-10-CM | POA: Diagnosis not present

## 2022-01-09 LAB — BASIC METABOLIC PANEL
Anion gap: 16 — ABNORMAL HIGH (ref 5–15)
BUN: 11 mg/dL (ref 6–20)
CO2: 19 mmol/L — ABNORMAL LOW (ref 22–32)
Calcium: 9.3 mg/dL (ref 8.9–10.3)
Chloride: 107 mmol/L (ref 98–111)
Creatinine, Ser: <0.3 mg/dL — ABNORMAL LOW (ref 0.61–1.24)
Glucose, Bld: 87 mg/dL (ref 70–99)
Potassium: 3.4 mmol/L — ABNORMAL LOW (ref 3.5–5.1)
Sodium: 142 mmol/L (ref 135–145)

## 2022-01-09 LAB — CBC
HCT: 28.7 % — ABNORMAL LOW (ref 39.0–52.0)
Hemoglobin: 9.4 g/dL — ABNORMAL LOW (ref 13.0–17.0)
MCH: 30.3 pg (ref 26.0–34.0)
MCHC: 32.8 g/dL (ref 30.0–36.0)
MCV: 92.6 fL (ref 80.0–100.0)
Platelets: 271 10*3/uL (ref 150–400)
RBC: 3.1 MIL/uL — ABNORMAL LOW (ref 4.22–5.81)
RDW: 14.6 % (ref 11.5–15.5)
WBC: 5.2 10*3/uL (ref 4.0–10.5)
nRBC: 0 % (ref 0.0–0.2)

## 2022-01-10 DIAGNOSIS — L89151 Pressure ulcer of sacral region, stage 1: Secondary | ICD-10-CM | POA: Diagnosis not present

## 2022-01-10 DIAGNOSIS — E46 Unspecified protein-calorie malnutrition: Secondary | ICD-10-CM | POA: Diagnosis not present

## 2022-01-10 DIAGNOSIS — E119 Type 2 diabetes mellitus without complications: Secondary | ICD-10-CM | POA: Diagnosis not present

## 2022-01-10 DIAGNOSIS — J9620 Acute and chronic respiratory failure, unspecified whether with hypoxia or hypercapnia: Secondary | ICD-10-CM | POA: Diagnosis not present

## 2022-01-10 LAB — BASIC METABOLIC PANEL
Anion gap: 9 (ref 5–15)
BUN: 9 mg/dL (ref 6–20)
CO2: 20 mmol/L — ABNORMAL LOW (ref 22–32)
Calcium: 8.9 mg/dL (ref 8.9–10.3)
Chloride: 109 mmol/L (ref 98–111)
Creatinine, Ser: <0.3 mg/dL — ABNORMAL LOW (ref 0.61–1.24)
Glucose, Bld: 97 mg/dL (ref 70–99)
Potassium: 3.7 mmol/L (ref 3.5–5.1)
Sodium: 138 mmol/L (ref 135–145)

## 2022-01-10 LAB — CBC
HCT: 30.4 % — ABNORMAL LOW (ref 39.0–52.0)
Hemoglobin: 10 g/dL — ABNORMAL LOW (ref 13.0–17.0)
MCH: 30.6 pg (ref 26.0–34.0)
MCHC: 32.9 g/dL (ref 30.0–36.0)
MCV: 93 fL (ref 80.0–100.0)
Platelets: 267 10*3/uL (ref 150–400)
RBC: 3.27 MIL/uL — ABNORMAL LOW (ref 4.22–5.81)
RDW: 14.4 % (ref 11.5–15.5)
WBC: 6 10*3/uL (ref 4.0–10.5)
nRBC: 0 % (ref 0.0–0.2)

## 2022-01-10 LAB — BLOOD GAS, ARTERIAL
Acid-base deficit: 0.3 mmol/L (ref 0.0–2.0)
Bicarbonate: 21.2 mmol/L (ref 20.0–28.0)
Drawn by: 164
O2 Saturation: 93.4 %
Patient temperature: 37
pCO2 arterial: 26 mmHg — ABNORMAL LOW (ref 32–48)
pH, Arterial: 7.52 — ABNORMAL HIGH (ref 7.35–7.45)
pO2, Arterial: 66 mmHg — ABNORMAL LOW (ref 83–108)

## 2022-01-10 LAB — MAGNESIUM: Magnesium: 2 mg/dL (ref 1.7–2.4)

## 2022-01-11 DIAGNOSIS — G1221 Amyotrophic lateral sclerosis: Secondary | ICD-10-CM

## 2022-01-11 DIAGNOSIS — A419 Sepsis, unspecified organism: Secondary | ICD-10-CM | POA: Diagnosis not present

## 2022-01-11 DIAGNOSIS — R6521 Severe sepsis with septic shock: Secondary | ICD-10-CM

## 2022-01-11 DIAGNOSIS — G909 Disorder of the autonomic nervous system, unspecified: Secondary | ICD-10-CM

## 2022-01-11 DIAGNOSIS — J9621 Acute and chronic respiratory failure with hypoxia: Secondary | ICD-10-CM | POA: Diagnosis not present

## 2022-01-11 DIAGNOSIS — J9610 Chronic respiratory failure, unspecified whether with hypoxia or hypercapnia: Secondary | ICD-10-CM | POA: Diagnosis not present

## 2022-01-11 DIAGNOSIS — L89151 Pressure ulcer of sacral region, stage 1: Secondary | ICD-10-CM | POA: Diagnosis not present

## 2022-01-11 DIAGNOSIS — E119 Type 2 diabetes mellitus without complications: Secondary | ICD-10-CM | POA: Diagnosis not present

## 2022-01-11 DIAGNOSIS — J151 Pneumonia due to Pseudomonas: Secondary | ICD-10-CM

## 2022-01-11 DIAGNOSIS — F419 Anxiety disorder, unspecified: Secondary | ICD-10-CM | POA: Diagnosis not present

## 2022-01-11 NOTE — Consult Note (Signed)
Pulmonary [place]  PULMONARY SERVICE  Date of Service: 01/11/2022  PULMONARY CRITICAL CARE CONSULT   Jerry [name]  YKZ:[id number]  DOB: 1971/01/18   DOA: 01/08/2022  Referring Physician: Satira Sark, MD  HPI: Jerry [name] is a 51 y.o. male seen for follow up of Acute on Chronic Respiratory Failure.  Patient has a multitude of medical problems including meningitis obesity ALS advanced severe disease came into the hospital because of having been found with altered mental status.  Patient at that had to be intubated placed on mechanical ventilation because of respiratory decompensation.  The patient's subsequent course he went for a tracheostomy on the 12th June which is approximately 5 days later was then started on weaning trials with a T collar which he failed after only about 10 minutes.  Patient did undergo multiple bronchoscopies for secretion retention with inability to actually cough up his arms creations.  Patient has had multiple mucous plugs were removed and apparently cultures were positive for Pseudomonas.  Transferred to our facility for further management and weaning.  Review of Systems:  ROS performed and is unremarkable other than noted above.  Past Medical History:  Diagnosis Date   ALS (amyotrophic lateral sclerosis) ([place])    COVID-19 virus infection 06/2020   Meningitis 2003   spinal   Morbid obesity ([place])    Prediabetes 11/05/2016   A1C 5.7 on 11/05/16    Past Surgical History:  Procedure Laterality Date   MASS EXCISION Right 11/26/2014   Procedure: EXCISION MASS/GROIN EXCISION MASS;  Surgeon: Robert Bellow, MD;  Location: [facility] ORS;  Service: General;  Laterality: Right;   MASS EXCISION Right 11/26/2014   Procedure: MINOR EXCISION OF MASS/POST THIGH MASS;  Surgeon: Robert Bellow, MD;  Location: [facility] ORS;  Service: General;  Laterality: Right;   PEG PLACEMENT  10/27/2021   PORTACATH PLACEMENT Right 06/19/2019     Social History:    reports that he has never smoked. He has never used smokeless tobacco. He reports that he does not drink alcohol and does not use drugs.  Family History: Non-Contributory to the present illness  Allergies  Allergen Reactions   Crab [Shellfish Allergy] Rash    Medications: Reviewed on Rounds  Physical Exam:  Vitals: Temperature is 97.0 pulse 87 respiratory rate 31 blood pressure is 104/67 saturation 100%  Ventilator Settings assist control FiO2 30% tidal volume 518 PEEP 5  General: Comfortable at this time Eyes: Grossly normal lids, irises & conjunctiva ENT: grossly tongue is normal Neck: no obvious mass Cardiovascular: S1-S2 normal no gallop or rub Respiratory: Coarse rhonchi expansion is equal Abdomen: Soft and nontender Skin: no rash seen on limited exam Musculoskeletal: not rigid Psychiatric:unable to assess Neurologic: no seizure no involuntary movements         Labs on Admission:  Basic Metabolic Panel: Recent Labs  Lab 01/06/22 0324 01/09/22 0332 01/10/22 0128  NA 141 142 138  K 3.9 3.4* 3.7  CL 107 107 109  CO2 25 19* 20*  GLUCOSE 93 87 97  BUN '12 11 9  '$ CREATININE <0.30* <0.30* <0.30*  CALCIUM 9.2 9.3 8.9  MG  --   --  2.0    Recent Labs  Lab 01/09/22 1810  PHART 7.52*  PCO2ART 26*  PO2ART 66*  HCO3 21.2  O2SAT 93.4    Liver Function Tests: Recent Labs  Lab 01/06/22 0324  AST 17  ALT 22  ALKPHOS 70  BILITOT 0.6  PROT 6.1*  ALBUMIN 2.9*   No results for input(s): "LIPASE", "AMYLASE" in the last 168 hours. No results for input(s): "AMMONIA" in the last 168 hours.  CBC: Recent Labs  Lab 01/06/22 0324 01/09/22 0332 01/10/22 0128  WBC 4.3 5.2 6.0  HGB 9.3* 9.4* 10.0*  HCT 28.9* 28.7* 30.4*  MCV 94.1 92.6 93.0  PLT 296 271 267    Cardiac Enzymes: No results for input(s): "CKTOTAL", "CKMB", "CKMBINDEX", "TROPONINI" in the last 168 hours.  BNP (last 3 results) No results for input(s): "BNP" in the  last 8760 hours.  ProBNP (last 3 results) No results for input(s): "PROBNP" in the last 8760 hours.   Radiological Exams on Admission: DG CHEST PORT 1 VIEW  Result Date: 01/09/2022 CLINICAL DATA:  Shortness of breath EXAM: PORTABLE CHEST 1 VIEW COMPARISON:  12/26/2021 FINDINGS: Tracheostomy tube in place. Porta catheter with tip at the right atrium. Unchanged streaky density at the bases. No edema, effusion, or pneumothorax. Normal heart size. IMPRESSION: Mild atelectasis at the bases. Electronically Signed   By: Jorje Guild M.D.   On: 01/09/2022 09:55   DG ABDOMEN PEG TUBE LOCATION  Result Date: 01/08/2022 CLINICAL DATA:  Post gastrostomy tube placement EXAM: ABDOMEN - 1 VIEW COMPARISON:  12/12/2021 FINDINGS: Small amount of contrast material overlies the proximal stomach. No gross extravasation. Mild diffuse increased bowel gas without obstructive pattern. IMPRESSION: 1. Contrast injection opacifies the proximal stomach. No gross extravasation 2. Mild diffuse increased bowel gas without obstructive pattern, possible ileus . Electronically Signed   By: Donavan Foil M.D.   On: 01/08/2022 20:35    Assessment/Plan Active Problems:   ALS (amyotrophic lateral sclerosis) ([facility])   Septic shock ([facility])   Pseudomonas pneumonia ([facility])   Autonomic dysfunction   Acute on chronic respiratory failure with hypoxia ([facility])   Acute on chronic respiratory failure with hypoxia patient is going to be tolerated based on his advanced severe disease.  When I spoke to the patient's wife at the bedside he had already been on a trilogy ventilator at home although not with the tracheostomy and now with the acute illness which is tipped him over the edge and I believe that it is going to be extremely difficult for him to come off of the ventilator.  Regardless of this and frank discussion patient still wanting to give it a try.  I think the best case scenario in this patient will be if he is able to come off for a few  hours a day and then be placed back on the ventilator for nocturnal support Pseudomonas pneumonia patient had bronchoscopy grew Pseudomonas has been treated with antibiotics we will continue to follow along the last chest x-ray reveals atelectasis at the bases consistent with poor secretion clearance ALS advanced severe disease patient had already been on support at home although noninvasively ventilator support Autonomic dysfunction no change we will continue to monitor at this time. Severe sepsis with shock this has resolved patient is currently off of pressors  I have personally seen and evaluated the patient, evaluated laboratory and imaging results, formulated the assessment and plan and placed orders. The Patient requires high complexity decision making with multiple systems involvement.  Case was discussed on Rounds with the Respiratory Therapy Director and the Respiratory staff Time Spent 23mnutes  Shakendra Griffeth A Adriann Thau, MD FLakeview Specialty Hospital & Rehab CenterPulmonary Critical Care Medicine Sleep Medicine

## 2022-01-12 DIAGNOSIS — R6521 Severe sepsis with septic shock: Secondary | ICD-10-CM | POA: Diagnosis not present

## 2022-01-12 DIAGNOSIS — G1221 Amyotrophic lateral sclerosis: Secondary | ICD-10-CM | POA: Diagnosis not present

## 2022-01-12 DIAGNOSIS — J9621 Acute and chronic respiratory failure with hypoxia: Secondary | ICD-10-CM | POA: Diagnosis not present

## 2022-01-12 DIAGNOSIS — J9620 Acute and chronic respiratory failure, unspecified whether with hypoxia or hypercapnia: Secondary | ICD-10-CM | POA: Diagnosis not present

## 2022-01-12 DIAGNOSIS — A419 Sepsis, unspecified organism: Secondary | ICD-10-CM | POA: Diagnosis not present

## 2022-01-12 DIAGNOSIS — G909 Disorder of the autonomic nervous system, unspecified: Secondary | ICD-10-CM | POA: Diagnosis not present

## 2022-01-12 DIAGNOSIS — E46 Unspecified protein-calorie malnutrition: Secondary | ICD-10-CM | POA: Diagnosis not present

## 2022-01-12 DIAGNOSIS — E119 Type 2 diabetes mellitus without complications: Secondary | ICD-10-CM | POA: Diagnosis not present

## 2022-01-12 DIAGNOSIS — L89151 Pressure ulcer of sacral region, stage 1: Secondary | ICD-10-CM | POA: Diagnosis not present

## 2022-01-12 DIAGNOSIS — J151 Pneumonia due to Pseudomonas: Secondary | ICD-10-CM | POA: Diagnosis not present

## 2022-01-12 NOTE — Progress Notes (Signed)
Pulmonary Critical Care Medicine [place]   PULMONARY CRITICAL CARE SERVICE  PROGRESS NOTE     Jerry [name]  EZM:[id number]  DOB: Jun 30, 1971   DOA: 01/08/2022  Referring Physician: Satira Sark, MD  HPI: Jerry [name] is a 51 y.o. male being followed for ventilator/airway/oxygen weaning Acute on Chronic Respiratory Failure.  Patient is afebrile right now comfortable without distress at this time patient has been on the ventilator and full support.  Patient was attempted on spontaneous breathing trial however failed  Medications: Reviewed on Rounds  Physical Exam:  Vitals: Temperature is 96.1 pulse 80 respiratory 25 blood pressure 110/66 saturations 99%  Ventilator Settings assist control FiO2 30% tidal volume 518 PEEP 5  General: Comfortable at this time Neck: supple Cardiovascular: no malignant arrhythmias Respiratory: Scattered rhonchi expansion is equal Skin: no rash seen on limited exam Musculoskeletal: No gross abnormality Psychiatric:unable to assess Neurologic:no involuntary movements         Lab Data:   Basic Metabolic Panel: Recent Labs  Lab 01/06/22 0324 01/09/22 0332 01/10/22 0128  NA 141 142 138  K 3.9 3.4* 3.7  CL 107 107 109  CO2 25 19* 20*  GLUCOSE 93 87 97  BUN '12 11 9  '$ CREATININE <0.30* <0.30* <0.30*  CALCIUM 9.2 9.3 8.9  MG  --   --  2.0    ABG: Recent Labs  Lab 01/09/22 1810  PHART 7.52*  PCO2ART 26*  PO2ART 66*  HCO3 21.2  O2SAT 93.4    Liver Function Tests: Recent Labs  Lab 01/06/22 0324  AST 17  ALT 22  ALKPHOS 70  BILITOT 0.6  PROT 6.1*  ALBUMIN 2.9*   No results for input(s): "LIPASE", "AMYLASE" in the last 168 hours. No results for input(s): "AMMONIA" in the last 168 hours.  CBC: Recent Labs  Lab 01/06/22 0324 01/09/22 0332 01/10/22 0128  WBC 4.3 5.2 6.0  HGB 9.3* 9.4* 10.0*  HCT 28.9* 28.7* 30.4*  MCV 94.1 92.6 93.0  PLT 296 271 267    Cardiac Enzymes: No results for input(s):  "CKTOTAL", "CKMB", "CKMBINDEX", "TROPONINI" in the last 168 hours.  BNP (last 3 results) No results for input(s): "BNP" in the last 8760 hours.  ProBNP (last 3 results) No results for input(s): "PROBNP" in the last 8760 hours.  Radiological Exams: No results found.  Assessment/Plan Active Problems:   ALS (amyotrophic lateral sclerosis) (HCC)   Septic shock (HCC)   Pseudomonas pneumonia (HCC)   Autonomic dysfunction   Acute on chronic respiratory failure with hypoxia (HCC)   Acute on chronic respiratory failure with hypoxia patient is currently failing attempts on spontaneous breathing trial patient as already mentioned previously is not a candidate for weaning and is not likely to be liberated from the ventilator however family wants to make attempts ALS supportive care advanced severe disease Pseudomonas pneumonia has been treated prognosis guarded Autonomic dysfunction no change Sepsis with shock no change continue to follow along   I have personally seen and evaluated the patient, evaluated laboratory and imaging results, formulated the assessment and plan and placed orders. The Patient requires high complexity decision making with multiple systems involvement.  Rounds were done with the Respiratory Therapy Director and Staff therapists and discussed with nursing staff also.  Allyne Gee, MD [facility] Pulmonary Critical Care Medicine Sleep Medicine

## 2022-01-13 DIAGNOSIS — R6521 Severe sepsis with septic shock: Secondary | ICD-10-CM | POA: Diagnosis not present

## 2022-01-13 DIAGNOSIS — E119 Type 2 diabetes mellitus without complications: Secondary | ICD-10-CM | POA: Diagnosis not present

## 2022-01-13 DIAGNOSIS — J9610 Chronic respiratory failure, unspecified whether with hypoxia or hypercapnia: Secondary | ICD-10-CM | POA: Diagnosis not present

## 2022-01-13 DIAGNOSIS — A419 Sepsis, unspecified organism: Secondary | ICD-10-CM | POA: Diagnosis not present

## 2022-01-13 DIAGNOSIS — J151 Pneumonia due to Pseudomonas: Secondary | ICD-10-CM | POA: Diagnosis not present

## 2022-01-13 DIAGNOSIS — L89151 Pressure ulcer of sacral region, stage 1: Secondary | ICD-10-CM | POA: Diagnosis not present

## 2022-01-13 DIAGNOSIS — F419 Anxiety disorder, unspecified: Secondary | ICD-10-CM | POA: Diagnosis not present

## 2022-01-13 DIAGNOSIS — J9621 Acute and chronic respiratory failure with hypoxia: Secondary | ICD-10-CM | POA: Diagnosis not present

## 2022-01-13 DIAGNOSIS — G1221 Amyotrophic lateral sclerosis: Secondary | ICD-10-CM | POA: Diagnosis not present

## 2022-01-13 DIAGNOSIS — G909 Disorder of the autonomic nervous system, unspecified: Secondary | ICD-10-CM | POA: Diagnosis not present

## 2022-01-13 LAB — BASIC METABOLIC PANEL
Anion gap: 8 (ref 5–15)
BUN: 9 mg/dL (ref 6–20)
CO2: 20 mmol/L — ABNORMAL LOW (ref 22–32)
Calcium: 9 mg/dL (ref 8.9–10.3)
Chloride: 110 mmol/L (ref 98–111)
Creatinine, Ser: <0.3 mg/dL — ABNORMAL LOW (ref 0.61–1.24)
Glucose, Bld: 130 mg/dL — ABNORMAL HIGH (ref 70–99)
Potassium: 3.4 mmol/L — ABNORMAL LOW (ref 3.5–5.1)
Sodium: 138 mmol/L (ref 135–145)

## 2022-01-13 LAB — CBC
HCT: 29 % — ABNORMAL LOW (ref 39.0–52.0)
Hemoglobin: 9.8 g/dL — ABNORMAL LOW (ref 13.0–17.0)
MCH: 30.2 pg (ref 26.0–34.0)
MCHC: 33.8 g/dL (ref 30.0–36.0)
MCV: 89.5 fL (ref 80.0–100.0)
Platelets: 252 10*3/uL (ref 150–400)
RBC: 3.24 MIL/uL — ABNORMAL LOW (ref 4.22–5.81)
RDW: 14.1 % (ref 11.5–15.5)
WBC: 5.2 10*3/uL (ref 4.0–10.5)
nRBC: 0 % (ref 0.0–0.2)

## 2022-01-13 LAB — MAGNESIUM: Magnesium: 1.8 mg/dL (ref 1.7–2.4)

## 2022-01-13 NOTE — Progress Notes (Signed)
Pulmonary Critical Care Medicine [place]   PULMONARY CRITICAL CARE SERVICE  PROGRESS NOTE     Jerry [name]  ZHY:[id number]  DOB: Oct 07, 1970   DOA: 01/08/2022  Referring Physician: Satira Sark, MD  HPI: Jerry [name] is a 51 y.o. male being followed for ventilator/airway/oxygen weaning Acute on Chronic Respiratory Failure.  Patient is afebrile resting comfortably has been on assist control mode currently on 30% FiO2 saturations are good  Medications: Reviewed on Rounds  Physical Exam:  Vitals: Temperature is 97.2 pulse of 97 respiratory is 25 blood pressure is 106/65 saturations 100%  Ventilator Settings on assist control FiO2 30% tidal volume 500 PEEP 5  General: Comfortable at this time Neck: supple Cardiovascular: no malignant arrhythmias Respiratory: Coarse scattered rhonchi Skin: no rash seen on limited exam Musculoskeletal: No gross abnormality Psychiatric:unable to assess Neurologic:no involuntary movements         Lab Data:   Basic Metabolic Panel: Recent Labs  Lab 01/09/22 0332 01/10/22 0128 01/13/22 0424  NA 142 138 138  K 3.4* 3.7 3.4*  CL 107 109 110  CO2 19* 20* 20*  GLUCOSE 87 97 130*  BUN '11 9 9  '$ CREATININE <0.30* <0.30* <0.30*  CALCIUM 9.3 8.9 9.0  MG  --  2.0 1.8    ABG: Recent Labs  Lab 01/09/22 1810  PHART 7.52*  PCO2ART 26*  PO2ART 66*  HCO3 21.2  O2SAT 93.4    Liver Function Tests: No results for input(s): "AST", "ALT", "ALKPHOS", "BILITOT", "PROT", "ALBUMIN" in the last 168 hours. No results for input(s): "LIPASE", "AMYLASE" in the last 168 hours. No results for input(s): "AMMONIA" in the last 168 hours.  CBC: Recent Labs  Lab 01/09/22 0332 01/10/22 0128 01/13/22 0424  WBC 5.2 6.0 5.2  HGB 9.4* 10.0* 9.8*  HCT 28.7* 30.4* 29.0*  MCV 92.6 93.0 89.5  PLT 271 267 252    Cardiac Enzymes: No results for input(s): "CKTOTAL", "CKMB", "CKMBINDEX", "TROPONINI" in the last 168 hours.  BNP (last 3  results) No results for input(s): "BNP" in the last 8760 hours.  ProBNP (last 3 results) No results for input(s): "PROBNP" in the last 8760 hours.  Radiological Exams: No results found.  Assessment/Plan Active Problems:   ALS (amyotrophic lateral sclerosis) (HCC)   Septic [name] (HCC)   Pseudomonas pneumonia (HCC)   Autonomic dysfunction   Acute on chronic respiratory failure with hypoxia (HCC)   Acute on chronic respiratory failure hypoxia patient was attempted on weaning once again however failed the spontaneous breathing trial so was placed back on full support Severe sepsis with [name] treated resolved hemodynamics are stable ALS supportive care we will continue to monitor Pseudomonas pneumonia has been treated with antibiotics Autonomic dysfunction at baseline   I have personally seen and evaluated the patient, evaluated laboratory and imaging results, formulated the assessment and plan and placed orders. The Patient requires high complexity decision making with multiple systems involvement.  Rounds were done with the Respiratory Therapy Director and Staff therapists and discussed with nursing staff also.  Allyne Gee, MD [facility] Pulmonary Critical Care Medicine Sleep Medicine

## 2022-01-14 ENCOUNTER — Ambulatory Visit: Payer: Self-pay

## 2022-01-14 DIAGNOSIS — J9620 Acute and chronic respiratory failure, unspecified whether with hypoxia or hypercapnia: Secondary | ICD-10-CM | POA: Diagnosis not present

## 2022-01-14 DIAGNOSIS — J151 Pneumonia due to Pseudomonas: Secondary | ICD-10-CM | POA: Diagnosis not present

## 2022-01-14 DIAGNOSIS — E46 Unspecified protein-calorie malnutrition: Secondary | ICD-10-CM | POA: Diagnosis not present

## 2022-01-14 DIAGNOSIS — G909 Disorder of the autonomic nervous system, unspecified: Secondary | ICD-10-CM | POA: Diagnosis not present

## 2022-01-14 DIAGNOSIS — A419 Sepsis, unspecified organism: Secondary | ICD-10-CM | POA: Diagnosis not present

## 2022-01-14 DIAGNOSIS — J9621 Acute and chronic respiratory failure with hypoxia: Secondary | ICD-10-CM | POA: Diagnosis not present

## 2022-01-14 DIAGNOSIS — E119 Type 2 diabetes mellitus without complications: Secondary | ICD-10-CM | POA: Diagnosis not present

## 2022-01-14 DIAGNOSIS — G1221 Amyotrophic lateral sclerosis: Secondary | ICD-10-CM | POA: Diagnosis not present

## 2022-01-14 DIAGNOSIS — R6521 Severe sepsis with septic shock: Secondary | ICD-10-CM | POA: Diagnosis not present

## 2022-01-14 DIAGNOSIS — L89151 Pressure ulcer of sacral region, stage 1: Secondary | ICD-10-CM | POA: Diagnosis not present

## 2022-01-14 LAB — POTASSIUM: Potassium: 3.6 mmol/L (ref 3.5–5.1)

## 2022-01-14 NOTE — Chronic Care Management (AMB) (Signed)
Error Please Disregard

## 2022-01-14 NOTE — Progress Notes (Addendum)
Pulmonary Critical Care Medicine [place]   PULMONARY CRITICAL CARE SERVICE  PROGRESS NOTE     Kailon Treese  VQM:[id number]  DOB: 10/16/1970   DOA: 01/08/2022  Referring Physician: Satira Sark, MD  HPI: Jose Corvin is a 51 y.o. male being followed for ventilator/airway/oxygen weaning Acute on Chronic Respiratory Failure.  Patient seen lying in bed, currently remains on pressure support.  He has failed his pressure support trials up until today.  Currently on a pressure support goal of 4 hours.  No acute distress, no acute overnight events.  Medications: Reviewed on Rounds  Physical Exam:  Vitals: Temp 97.8, pulse 81, respirations 20, BP 116/65, SPO2 100%  Ventilator Settings pressure support 12/5 FiO2 30%  General: Comfortable at this time Neck: supple Cardiovascular: no malignant arrhythmias Respiratory: Bilaterally clear Skin: no rash seen on limited exam Musculoskeletal: No gross abnormality Psychiatric:unable to assess Neurologic:no involuntary movements         Lab Data:   Basic Metabolic Panel: Recent Labs  Lab 01/09/22 0332 01/10/22 0128 01/13/22 0424 01/14/22 0327  NA 142 138 138  --   K 3.4* 3.7 3.4* 3.6  CL 107 109 110  --   CO2 19* 20* 20*  --   GLUCOSE 87 97 130*  --   BUN '11 9 9  '$ --   CREATININE <0.30* <0.30* <0.30*  --   CALCIUM 9.3 8.9 9.0  --   MG  --  2.0 1.8  --     ABG: Recent Labs  Lab 01/09/22 1810  PHART 7.52*  PCO2ART 26*  PO2ART 66*  HCO3 21.2  O2SAT 93.4    Liver Function Tests: No results for input(s): "AST", "ALT", "ALKPHOS", "BILITOT", "PROT", "ALBUMIN" in the last 168 hours. No results for input(s): "LIPASE", "AMYLASE" in the last 168 hours. No results for input(s): "AMMONIA" in the last 168 hours.  CBC: Recent Labs  Lab 01/09/22 0332 01/10/22 0128 01/13/22 0424  WBC 5.2 6.0 5.2  HGB 9.4* 10.0* 9.8*  HCT 28.7* 30.4* 29.0*  MCV 92.6 93.0 89.5  PLT 271 267 252    Cardiac  Enzymes: No results for input(s): "CKTOTAL", "CKMB", "CKMBINDEX", "TROPONINI" in the last 168 hours.  BNP (last 3 results) No results for input(s): "BNP" in the last 8760 hours.  ProBNP (last 3 results) No results for input(s): "PROBNP" in the last 8760 hours.  Radiological Exams: No results found.  Assessment/Plan Active Problems:   ALS (amyotrophic lateral sclerosis) (HCC)   Septic shock (HCC)   Pseudomonas pneumonia (HCC)   Autonomic dysfunction   Acute on chronic respiratory failure with hypoxia (HCC)   Acute on chronic respiratory failure hypoxia-patient has attempted weaning for a few days now with failed attempts every day.  He is currently on a pressure support goal of 4 hours today.  Continue weaning per protocol and as patient tolerates.  Continue supportive care. ALS -remains a barrier to weaning.  We will continue with supportive care. Pseudomonas pneumonia- has been treated with antibiotics.  Continue with supportive care. Autonomic dysfunction-remains at baseline.   I have personally seen and evaluated the patient, evaluated laboratory and imaging results, formulated the assessment and plan and placed orders. The Patient requires high complexity decision making with multiple systems involvement.  Rounds were done with the Respiratory Therapy Director and Staff therapists and discussed with nursing staff also.  Allyne Gee, MD [facility] Pulmonary Critical Care Medicine Sleep Medicine

## 2022-01-15 DIAGNOSIS — J9621 Acute and chronic respiratory failure with hypoxia: Secondary | ICD-10-CM | POA: Diagnosis not present

## 2022-01-15 DIAGNOSIS — E119 Type 2 diabetes mellitus without complications: Secondary | ICD-10-CM | POA: Diagnosis not present

## 2022-01-15 DIAGNOSIS — A419 Sepsis, unspecified organism: Secondary | ICD-10-CM | POA: Diagnosis not present

## 2022-01-15 DIAGNOSIS — G909 Disorder of the autonomic nervous system, unspecified: Secondary | ICD-10-CM | POA: Diagnosis not present

## 2022-01-15 DIAGNOSIS — J151 Pneumonia due to Pseudomonas: Secondary | ICD-10-CM | POA: Diagnosis not present

## 2022-01-15 DIAGNOSIS — F419 Anxiety disorder, unspecified: Secondary | ICD-10-CM | POA: Diagnosis not present

## 2022-01-15 DIAGNOSIS — R6521 Severe sepsis with septic shock: Secondary | ICD-10-CM | POA: Diagnosis not present

## 2022-01-15 DIAGNOSIS — J9610 Chronic respiratory failure, unspecified whether with hypoxia or hypercapnia: Secondary | ICD-10-CM | POA: Diagnosis not present

## 2022-01-15 DIAGNOSIS — G1221 Amyotrophic lateral sclerosis: Secondary | ICD-10-CM | POA: Diagnosis not present

## 2022-01-15 DIAGNOSIS — L89151 Pressure ulcer of sacral region, stage 1: Secondary | ICD-10-CM | POA: Diagnosis not present

## 2022-01-15 NOTE — Progress Notes (Cosign Needed)
Pulmonary Critical Care Medicine Jerry [name]   PULMONARY CRITICAL CARE SERVICE  PROGRESS NOTE     Jerry [name]  FOY:[id number]  DOB: [birth year]   DOA: 01/08/2022  Referring Physician: Satira Sark, MD  HPI: Jerry [name] is a 51 y.o. male being followed for ventilator/airway/oxygen weaning Acute on Chronic Respiratory Failure.  Patient seen lying in bed, currently on pressure support.  Was able to complete 4 hours pressure support yesterday.  Has an 8-hour goal of pressure support today.  He was able to tolerate 15 minutes of Passy-Muir valve today.  No acute distress, no acute overnight events.  Medications: Reviewed on Rounds  Physical Exam:  Vitals: Temp 97.0, pulse 79, respirations 20, BP 108/64, SPO2 100%  Ventilator Settings pressure support 12/5 FiO2 30%  General: Comfortable at this time Neck: supple Cardiovascular: no malignant arrhythmias Respiratory: Bilaterally clear Skin: no rash seen on limited exam Musculoskeletal: No gross abnormality Psychiatric:unable to assess Neurologic:no involuntary movements         Lab Data:   Basic Metabolic Panel: Recent Labs  Lab 01/09/22 0332 01/10/22 0128 01/13/22 0424 01/14/22 0327  NA 142 138 138  --   K 3.4* 3.7 3.4* 3.6  CL 107 109 110  --   CO2 19* 20* 20*  --   GLUCOSE 87 97 130*  --   BUN '11 9 9  '$ --   CREATININE <0.30* <0.30* <0.30*  --   CALCIUM 9.3 8.9 9.0  --   MG  --  2.0 1.8  --     ABG: Recent Labs  Lab 01/09/22 1810  PHART 7.52*  PCO2ART 26*  PO2ART 66*  HCO3 21.2  O2SAT 93.4    Liver Function Tests: No results for input(s): "AST", "ALT", "ALKPHOS", "BILITOT", "PROT", "ALBUMIN" in the last 168 hours. No results for input(s): "LIPASE", "AMYLASE" in the last 168 hours. No results for input(s): "AMMONIA" in the last 168 hours.  CBC: Recent Labs  Lab 01/09/22 0332 01/10/22 0128 01/13/22 0424  WBC 5.2 6.0 5.2  HGB 9.4* 10.0* 9.8*  HCT 28.7* 30.4* 29.0*  MCV 92.6  93.0 89.5  PLT 271 267 252    Cardiac Enzymes: No results for input(s): "CKTOTAL", "CKMB", "CKMBINDEX", "TROPONINI" in the last 168 hours.  BNP (last 3 results) No results for input(s): "BNP" in the last 8760 hours.  ProBNP (last 3 results) No results for input(s): "PROBNP" in the last 8760 hours.  Radiological Exams: No results found.  Assessment/Plan Active Problems:   ALS (amyotrophic lateral sclerosis) (HCC)   Septic shock (HCC)   Pseudomonas pneumonia (HCC)   Autonomic dysfunction   Acute on chronic respiratory failure with hypoxia (HCC)   Acute on chronic respiratory failure hypoxia-was successful with his 4-hour pressure support yesterday.  Has an 8-hour goal today.  He was able to tolerate Passy-Muir valve for 15 minutes today.  Continue weaning per protocol and as patient tolerates.  Continue supportive care. ALS -remains a barrier to weaning.  We will continue with supportive care. Pseudomonas pneumonia- has been treated with antibiotics, per infectious disease continue through 17 July, may need extended treatment depending on response.  Continue with supportive care. Autonomic dysfunction-remains at baseline. Dysphagia-remains high risk for recurrent aspiration and aspiration pneumonia.  Continue with aspiration precautions.   I have personally seen and evaluated the patient, evaluated laboratory and imaging results, formulated the assessment and plan and placed orders. The Patient requires high complexity decision making with multiple systems involvement.  Rounds were done  with the Respiratory Therapy Director and Staff therapists and discussed with nursing staff also.  Allyne Gee, MD [facility] Pulmonary Critical Care Medicine Sleep Medicine

## 2022-01-15 NOTE — Consult Note (Signed)
Infectious Disease Consultation   Jerry [name]  XIP:[id number]  DOB: 04/10/1971  DOA: 01/08/2022  Requesting physician: Dr. Esmeralda Links  Reason for consultation: Antibiotic recommendations   History of Present Illness: Jerry [name] is an 51 y.o. male with medical history significant for ALS who presented to [facility] ED on 12/09/2021 via EMS after being found by family to be altered, less responsive, oxygen saturation dropping into the 60s.  Patient is followed by [place] [facility].  Unfortunately ALS progressed to the point where he is wheelchair-bound in addition to worsening respiratory distress.  Here he had PEG tube placement.  On arrival to the emergency department he had to be intubated immediately, he had a brief cardiac arrest in the ED at the time of intubation with recurrent cardiac arrest in the ICU.  At the acute facility patient had to be placed on vasopressors.  He had bronchoscopy done on 12/18/2021 which showed thick mucus removed from right lower lobe.  Cultures showed Pseudomonas.  He was treated with azithromycin, inhaled tobramycin.  He failed weaning trials. Once patient stabilized he was transferred and admitted to [place]:  As per HPI otherwise 10 point review of systems negative.   Past Medical History: Past Medical History:  Diagnosis Date   ALS (amyotrophic lateral sclerosis) ([place])    COVID-19 virus infection 06/2020   Meningitis 2003   spinal   Morbid obesity ([place])    Prediabetes 11/05/2016   A1C 5.7 on 11/05/16    Past Surgical History: Past Surgical History:  Procedure Laterality Date   MASS EXCISION Right 11/26/2014   Procedure: EXCISION MASS/GROIN EXCISION MASS;  Surgeon: Robert Bellow, MD;  Location: [facility] ORS;  Service: General;  Laterality: Right;   MASS EXCISION Right 11/26/2014   Procedure: MINOR EXCISION OF MASS/POST THIGH MASS;  Surgeon: Robert Bellow, MD;  Location: [facility] ORS;   Service: General;  Laterality: Right;   PEG PLACEMENT  10/27/2021   PORTACATH PLACEMENT Right 06/19/2019   Allergies:   Allergies  Allergen Reactions   Crab [Shellfish Allergy] Rash   Social History:  reports that he has never smoked. He has never used smokeless tobacco. He reports that he does not drink alcohol and does not use drugs.   Family History: Family History  Problem Relation Age of Onset   Diabetes Father    Diabetes Mother    Diabetes Sister    Hypertension Sister    Diabetes Brother    Diabetes Sister    Fibromyalgia Sister    Diabetes Sister    Physical Exam: Vitals: Temperature 97, pulse 74, respiratory rate 20, blood pressure 108/64, oxygen saturation 100% on FiO2 30%, PEEP 5  Constitutional: Awake, not in any acute distress Eyes: PERLA, EOMI  ENMT: external ears and nose appear normal, normal hearing, moist oral mucosa Neck: Has trach in place  CVS: S1-S2 Respiratory: Decreased breath sound lower lobes, scattered rhonchi Abdomen: soft nontender, nondistended, normal bowel sounds, PEG tube in place Musculoskeletal: No edema Neuro: Unable to move extremities Psych: Stable  skin: no rashes  Data reviewed:  I have personally reviewed following labs and imaging studies Labs:  CBC: Recent Labs  Lab 01/09/22 0332 01/10/22 0128 01/13/22 0424  WBC 5.2 6.0 5.2  HGB 9.4* 10.0* 9.8*  HCT 28.7* 30.4* 29.0*  MCV 92.6 93.0 89.5  PLT 271 267 673    Basic Metabolic Panel: Recent Labs  Lab 01/09/22 0332 01/10/22 0128 01/13/22 0424 01/14/22 0327  NA 142 138 138  --   K 3.4* 3.7 3.4* 3.6  CL 107 109 110  --   CO2 19* 20* 20*  --   GLUCOSE 87 97 130*  --   BUN '11 9 9  '$ --   CREATININE <0.30* <0.30* <0.30*  --   CALCIUM 9.3 8.9 9.0  --   MG  --  2.0 1.8  --    GFR CrCl cannot be calculated (This lab value cannot be used to calculate CrCl because it is not a number: <0.30). Liver Function Tests: No results for input(s): "AST", "ALT", "ALKPHOS",  "BILITOT", "PROT", "ALBUMIN" in the last 168 hours. No results for input(s): "LIPASE", "AMYLASE" in the last 168 hours. No results for input(s): "AMMONIA" in the last 168 hours. Coagulation profile No results for input(s): "INR", "PROTIME" in the last 168 hours.  Cardiac Enzymes: No results for input(s): "CKTOTAL", "CKMB", "CKMBINDEX", "TROPONINI" in the last 168 hours. BNP: Invalid input(s): "POCBNP" CBG: No results for input(s): "GLUCAP" in the last 168 hours. D-Dimer No results for input(s): "DDIMER" in the last 72 hours. Hgb A1c No results for input(s): "HGBA1C" in the last 72 hours. Lipid Profile No results for input(s): "CHOL", "HDL", "LDLCALC", "TRIG", "CHOLHDL", "LDLDIRECT" in the last 72 hours. Thyroid function studies No results for input(s): "TSH", "T4TOTAL", "T3FREE", "THYROIDAB" in the last 72 hours.  Invalid input(s): "FREET3" Anemia work up No results for input(s): "VITAMINB12", "FOLATE", "FERRITIN", "TIBC", "IRON", "RETICCTPCT" in the last 72 hours. Urinalysis    Component Value Date/Time   COLORURINE YELLOW 12/09/2021 0936   APPEARANCEUR HAZY (A) 12/09/2021 0936   LABSPEC 1.026 12/09/2021 0936   PHURINE 6.0 12/09/2021 0936   GLUCOSEU NEGATIVE 12/09/2021 0936   HGBUR SMALL (A) 12/09/2021 0936   BILIRUBINUR NEGATIVE 12/09/2021 0936   KETONESUR 20 (A) 12/09/2021 0936   PROTEINUR NEGATIVE 12/09/2021 0936   NITRITE NEGATIVE 12/09/2021 0936   LEUKOCYTESUR NEGATIVE 12/09/2021 0936    Sepsis Labs Recent Labs  Lab 01/09/22 0332 01/10/22 0128 01/13/22 0424  WBC 5.2 6.0 5.2   Microbiology No results found for this or any previous visit (from the past 240 hour(s)).   Inpatient Medications:   Please see MAR   Radiological Exams on Admission: No results found.  Impression/Recommendations Active Problems:   Septic shock (HCC)   Pseudomonas pneumonia (HCC) Acute on chronic respiratory failure with hypoxia/hypercapnia, vent dependent ALS (amyotrophic  lateral sclerosis) (HCC)  Autonomic dysfunction Diabetes mellitus type 2 Dysphagia/protein calorie malnutrition    Acute on chronic hypoxemic and hypercarbic respiratory failure secondary to progressive neuromuscular disease/ALS Patient presented to acute facility via EMS after being found altered, with SPO2 in the 60s.  Etiology likely secondary to progressive neuromuscular disease with underlying ALS.  Patient was intubated on admission and likely now vent dependent given progression of ALS. He underwent tracheostomy placement on 12/14/2021.  Unfortunately due to the chronic trach he is high risk for recurrent tracheobronchitis and recurrent pneumonia despite being on antibiotics.  If his respiratory status worsens, recommend to repeat chest imaging preferably chest CT which can be done without contrast to better evaluate.   Pseudomonas pneumonia -Septic shock at the acute facility.  Currently shock resolved. -He underwent bronchoscopy at the acute facility with cultures showing Pseudomonas.  Continue tobramycin nebulizers.  Tentative end date 01/18/2022 pending improvement.  However, may need to extend depending on his response.  He is at risk for recurrent aspiration, recurrent tracheobronchitis  and recurrent pneumonia.   Progressive ALS -Patient has been bedbound for the last year currently vent dependent.  Followed by [place] [facility]. -Continue trach and PEG care   Autonomic dysfunction -Etiology likely secondary to his underlying progressive ALS.  Continue to monitor.  Further management per the primary team.  Diabetes mellitus type 2: Continue medications and management per the primary team.  Dysphagia/protein calorie malnutrition: Due to his dysphagia he is high risk for recurrent aspiration and aspiration pneumonia despite being on antibiotics.  Management of PCM per the primary team.  Unfortunately due to his complex medical problems he is high risk for worsening and  decompensation.  Plan of care discussed with the primary team and pharmacy.  Thank you for this consultation.   Yaakov Guthrie M.D. 01/15/2022, 6:27 PM

## 2022-01-16 DIAGNOSIS — E46 Unspecified protein-calorie malnutrition: Secondary | ICD-10-CM | POA: Diagnosis not present

## 2022-01-16 DIAGNOSIS — A419 Sepsis, unspecified organism: Secondary | ICD-10-CM | POA: Diagnosis not present

## 2022-01-16 DIAGNOSIS — J151 Pneumonia due to Pseudomonas: Secondary | ICD-10-CM | POA: Diagnosis not present

## 2022-01-16 DIAGNOSIS — R6521 Severe sepsis with septic shock: Secondary | ICD-10-CM | POA: Diagnosis not present

## 2022-01-16 DIAGNOSIS — F419 Anxiety disorder, unspecified: Secondary | ICD-10-CM | POA: Diagnosis not present

## 2022-01-16 DIAGNOSIS — J9620 Acute and chronic respiratory failure, unspecified whether with hypoxia or hypercapnia: Secondary | ICD-10-CM | POA: Diagnosis not present

## 2022-01-16 DIAGNOSIS — G1221 Amyotrophic lateral sclerosis: Secondary | ICD-10-CM | POA: Diagnosis not present

## 2022-01-16 DIAGNOSIS — G909 Disorder of the autonomic nervous system, unspecified: Secondary | ICD-10-CM | POA: Diagnosis not present

## 2022-01-16 DIAGNOSIS — R1310 Dysphagia, unspecified: Secondary | ICD-10-CM | POA: Diagnosis not present

## 2022-01-16 DIAGNOSIS — J9621 Acute and chronic respiratory failure with hypoxia: Secondary | ICD-10-CM | POA: Diagnosis not present

## 2022-01-16 NOTE — Progress Notes (Cosign Needed)
Pulmonary Critical Care Medicine [place]   PULMONARY CRITICAL CARE SERVICE  PROGRESS NOTE     Jerry [name]  GUY:[id number]  DOB: [birth year]   DOA: 01/08/2022  Referring Physician: Satira Sark, MD  HPI: Jerry [name] is a 51 y.o. male being followed for ventilator/airway/oxygen weaning Acute on Chronic Respiratory Failure.  Patient seen lying in bed, spouse at bedside.  Was successful with his 8-hour pressure support goal yesterday, currently working towards a 12-hour pressure support goal today.  Was able to do PMV for a small time yesterday we will continue PMV as tolerated today.  No acute distress, no acute overnight events.  Medications: Reviewed on Rounds  Physical Exam:  Vitals: Temp 97.3, pulse 85, respirations 20, BP 1.5/71, SPO2 100%  Ventilator Settings pressure support 12/5, FiO2 30%  General: Comfortable at this time Neck: supple Cardiovascular: no malignant arrhythmias Respiratory: Bilaterally clear Skin: no rash seen on limited exam Musculoskeletal: No gross abnormality Psychiatric:unable to assess Neurologic:no involuntary movements         Lab Data:   Basic Metabolic Panel: Recent Labs  Lab 01/10/22 0128 01/13/22 0424 01/14/22 0327  NA 138 138  --   K 3.7 3.4* 3.6  CL 109 110  --   CO2 20* 20*  --   GLUCOSE 97 130*  --   BUN 9 9  --   CREATININE <0.30* <0.30*  --   CALCIUM 8.9 9.0  --   MG 2.0 1.8  --     ABG: No results for input(s): "PHART", "PCO2ART", "PO2ART", "HCO3", "O2SAT" in the last 168 hours.  Liver Function Tests: No results for input(s): "AST", "ALT", "ALKPHOS", "BILITOT", "PROT", "ALBUMIN" in the last 168 hours. No results for input(s): "LIPASE", "AMYLASE" in the last 168 hours. No results for input(s): "AMMONIA" in the last 168 hours.  CBC: Recent Labs  Lab 01/10/22 0128 01/13/22 0424  WBC 6.0 5.2  HGB 10.0* 9.8*  HCT 30.4* 29.0*  MCV 93.0 89.5  PLT 267 252    Cardiac Enzymes: No results  for input(s): "CKTOTAL", "CKMB", "CKMBINDEX", "TROPONINI" in the last 168 hours.  BNP (last 3 results) No results for input(s): "BNP" in the last 8760 hours.  ProBNP (last 3 results) No results for input(s): "PROBNP" in the last 8760 hours.  Radiological Exams: No results found.  Assessment/Plan Active Problems:   ALS (amyotrophic lateral sclerosis) (HCC)   Septic shock (HCC)   Pseudomonas pneumonia (HCC)   Autonomic dysfunction   Acute on chronic respiratory failure with hypoxia (HCC)   Acute on chronic respiratory failure hypoxia-was successful with 8-hour pressure support goal yesterday, currently working on a 12-hour pressure support goal today.  Was able to tolerate PMV for just a little while yesterday.  We will continue PMV as tolerated today.  Continue weaning per protocol and as patient tolerates.  Continue supportive care. ALS -remains a barrier to weaning.  We will continue with supportive care. Pseudomonas pneumonia- has been treated with antibiotics, per infectious disease continue through 17 July, may need extended treatment depending on response.  Continue with supportive care. Autonomic dysfunction-remains at baseline. Dysphagia-remains high risk for recurrent aspiration and aspiration pneumonia.  Continue with aspiration precautions.   I have personally seen and evaluated the patient, evaluated laboratory and imaging results, formulated the assessment and plan and placed orders. The Patient requires high complexity decision making with multiple systems involvement.  Rounds were done with the Respiratory Therapy Director and Staff therapists and discussed with nursing staff  also.  Allyne Gee, MD [facility] Pulmonary Critical Care Medicine Sleep Medicine

## 2022-01-17 DIAGNOSIS — A419 Sepsis, unspecified organism: Secondary | ICD-10-CM | POA: Diagnosis not present

## 2022-01-17 DIAGNOSIS — J151 Pneumonia due to Pseudomonas: Secondary | ICD-10-CM | POA: Diagnosis not present

## 2022-01-17 DIAGNOSIS — J9621 Acute and chronic respiratory failure with hypoxia: Secondary | ICD-10-CM | POA: Diagnosis not present

## 2022-01-17 DIAGNOSIS — G1221 Amyotrophic lateral sclerosis: Secondary | ICD-10-CM | POA: Diagnosis not present

## 2022-01-17 DIAGNOSIS — R1310 Dysphagia, unspecified: Secondary | ICD-10-CM | POA: Diagnosis not present

## 2022-01-17 DIAGNOSIS — E46 Unspecified protein-calorie malnutrition: Secondary | ICD-10-CM | POA: Diagnosis not present

## 2022-01-17 DIAGNOSIS — G909 Disorder of the autonomic nervous system, unspecified: Secondary | ICD-10-CM | POA: Diagnosis not present

## 2022-01-17 DIAGNOSIS — R6521 Severe sepsis with septic shock: Secondary | ICD-10-CM | POA: Diagnosis not present

## 2022-01-17 DIAGNOSIS — F419 Anxiety disorder, unspecified: Secondary | ICD-10-CM | POA: Diagnosis not present

## 2022-01-17 DIAGNOSIS — J9620 Acute and chronic respiratory failure, unspecified whether with hypoxia or hypercapnia: Secondary | ICD-10-CM | POA: Diagnosis not present

## 2022-01-17 LAB — CBC
HCT: 27.6 % — ABNORMAL LOW (ref 39.0–52.0)
Hemoglobin: 9.3 g/dL — ABNORMAL LOW (ref 13.0–17.0)
MCH: 30 pg (ref 26.0–34.0)
MCHC: 33.7 g/dL (ref 30.0–36.0)
MCV: 89 fL (ref 80.0–100.0)
Platelets: 269 10*3/uL (ref 150–400)
RBC: 3.1 MIL/uL — ABNORMAL LOW (ref 4.22–5.81)
RDW: 14 % (ref 11.5–15.5)
WBC: 6.1 10*3/uL (ref 4.0–10.5)
nRBC: 0 % (ref 0.0–0.2)

## 2022-01-17 LAB — BASIC METABOLIC PANEL
Anion gap: 11 (ref 5–15)
BUN: 12 mg/dL (ref 6–20)
CO2: 21 mmol/L — ABNORMAL LOW (ref 22–32)
Calcium: 9.3 mg/dL (ref 8.9–10.3)
Chloride: 106 mmol/L (ref 98–111)
Creatinine, Ser: <0.3 mg/dL — ABNORMAL LOW (ref 0.61–1.24)
Glucose, Bld: 110 mg/dL — ABNORMAL HIGH (ref 70–99)
Potassium: 3.6 mmol/L (ref 3.5–5.1)
Sodium: 138 mmol/L (ref 135–145)

## 2022-01-17 LAB — MAGNESIUM: Magnesium: 2 mg/dL (ref 1.7–2.4)

## 2022-01-17 NOTE — Progress Notes (Cosign Needed)
Pulmonary Critical Care Medicine [place]   PULMONARY CRITICAL CARE SERVICE  PROGRESS NOTE     Jerry [name]  PPI:[id number]  DOB: [birth year]   DOA: 01/08/2022  Referring Physician: Satira Sark, MD  HPI: Jerry [name] is a 51 y.o. male being followed for ventilator/airway/oxygen weaning Acute on Chronic Respiratory Failure.  Patient seen lying in bed, currently working on a 16-hour pressure support goal today.  Had a 12-hour pressure support goal yesterday however completed 15 hours.  Does continue to have a moderate amount of oral secretions.  We will continue PMV as tolerated.  No acute distress, no acute overnight events.  Medications: Reviewed on Rounds  Physical Exam:  Vitals: Temp 97.5, pulse 91, respirations 30, BP 102/60, SPO2 99%  Ventilator Settings pressure support 12/5, FiO2 30%  General: Comfortable at this time Neck: supple Cardiovascular: no malignant arrhythmias Respiratory: Bilaterally clear Skin: no rash seen on limited exam Musculoskeletal: No gross abnormality Psychiatric:unable to assess Neurologic:no involuntary movements         Lab Data:   Basic Metabolic Panel: Recent Labs  Lab 01/13/22 0424 01/14/22 0327 01/17/22 0358  NA 138  --  138  K 3.4* 3.6 3.6  CL 110  --  106  CO2 20*  --  21*  GLUCOSE 130*  --  110*  BUN 9  --  12  CREATININE <0.30*  --  <0.30*  CALCIUM 9.0  --  9.3  MG 1.8  --  2.0    ABG: No results for input(s): "PHART", "PCO2ART", "PO2ART", "HCO3", "O2SAT" in the last 168 hours.  Liver Function Tests: No results for input(s): "AST", "ALT", "ALKPHOS", "BILITOT", "PROT", "ALBUMIN" in the last 168 hours. No results for input(s): "LIPASE", "AMYLASE" in the last 168 hours. No results for input(s): "AMMONIA" in the last 168 hours.  CBC: Recent Labs  Lab 01/13/22 0424 01/17/22 0358  WBC 5.2 6.1  HGB 9.8* 9.3*  HCT 29.0* 27.6*  MCV 89.5 89.0  PLT 252 269    Cardiac Enzymes: No results for  input(s): "CKTOTAL", "CKMB", "CKMBINDEX", "TROPONINI" in the last 168 hours.  BNP (last 3 results) No results for input(s): "BNP" in the last 8760 hours.  ProBNP (last 3 results) No results for input(s): "PROBNP" in the last 8760 hours.  Radiological Exams: No results found.  Assessment/Plan Active Problems:   ALS (amyotrophic lateral sclerosis) (HCC)   Septic shock (HCC)   Pseudomonas pneumonia (HCC)   Autonomic dysfunction   Acute on chronic respiratory failure with hypoxia (HCC)  Acute on chronic respiratory failure hypoxia-was successful with 12-hour pressure support goal yesterday, actually completing 15.  Currently working on a 16-hour pressure support goal today. We will continue PMV as tolerated today.  Continue weaning per protocol and as patient tolerates.  Continue supportive care. ALS -remains a barrier to weaning.  We will continue with supportive care. Pseudomonas pneumonia- has been treated with antibiotics, per infectious disease continue through 17 July, may need extended treatment depending on response.  Continue with supportive care. Autonomic dysfunction-remains at baseline. Dysphagia-remains high risk for recurrent aspiration and aspiration pneumonia.  Continue with aspiration precautions.     I have personally seen and evaluated the patient, evaluated laboratory and imaging results, formulated the assessment and plan and placed orders. The Patient requires high complexity decision making with multiple systems involvement.  Rounds were done with the Respiratory Therapy Director and Staff therapists and discussed with nursing staff also.  Allyne Gee, MD [facility] Pulmonary Critical  Care Medicine Sleep Medicine

## 2022-01-18 ENCOUNTER — Other Ambulatory Visit (HOSPITAL_COMMUNITY): Payer: Self-pay

## 2022-01-18 DIAGNOSIS — R6521 Severe sepsis with septic shock: Secondary | ICD-10-CM | POA: Diagnosis not present

## 2022-01-18 DIAGNOSIS — G909 Disorder of the autonomic nervous system, unspecified: Secondary | ICD-10-CM | POA: Diagnosis not present

## 2022-01-18 DIAGNOSIS — G1221 Amyotrophic lateral sclerosis: Secondary | ICD-10-CM | POA: Diagnosis not present

## 2022-01-18 DIAGNOSIS — J9690 Respiratory failure, unspecified, unspecified whether with hypoxia or hypercapnia: Secondary | ICD-10-CM | POA: Diagnosis not present

## 2022-01-18 DIAGNOSIS — J9621 Acute and chronic respiratory failure with hypoxia: Secondary | ICD-10-CM | POA: Diagnosis not present

## 2022-01-18 DIAGNOSIS — A419 Sepsis, unspecified organism: Secondary | ICD-10-CM | POA: Diagnosis not present

## 2022-01-18 DIAGNOSIS — J151 Pneumonia due to Pseudomonas: Secondary | ICD-10-CM | POA: Diagnosis not present

## 2022-01-18 NOTE — Progress Notes (Addendum)
Pulmonary Critical Care Medicine [place]   PULMONARY CRITICAL CARE SERVICE  PROGRESS NOTE     Jerry [name]  LKG:[id number]  DOB: 22-May-1971   DOA: 01/08/2022  Referring Physician: Satira Sark, MD  HPI: Jerry [name] is a 51 y.o. male being followed for ventilator/airway/oxygen weaning Acute on Chronic Respiratory Failure.  Patient seen lying in bed, currently will be doing a 16-hour pressure support goal today.  Yesterday he did declined to do a 16-hour goal but was able to complete 12.  We will continue PMV as tolerated.  Moderate secretions remain present.  No acute distress, no acute overnight events  Medications: Reviewed on Rounds  Physical Exam:  Vitals: Temp 97.1, pulse 89, respirations 20,  Ventilator Settings pressure support however 5, FiO2 30%  General: Comfortable at this time Neck: supple Cardiovascular: no malignant arrhythmias Respiratory: Bilaterally clear Skin: no rash seen on limited exam Musculoskeletal: No gross abnormality Psychiatric:unable to assess Neurologic:no involuntary movements         Lab Data:   Basic Metabolic Panel: Recent Labs  Lab 01/13/22 0424 01/14/22 0327 01/17/22 0358  NA 138  --  138  K 3.4* 3.6 3.6  CL 110  --  106  CO2 20*  --  21*  GLUCOSE 130*  --  110*  BUN 9  --  12  CREATININE <0.30*  --  <0.30*  CALCIUM 9.0  --  9.3  MG 1.8  --  2.0    ABG: No results for input(s): "PHART", "PCO2ART", "PO2ART", "HCO3", "O2SAT" in the last 168 hours.  Liver Function Tests: No results for input(s): "AST", "ALT", "ALKPHOS", "BILITOT", "PROT", "ALBUMIN" in the last 168 hours. No results for input(s): "LIPASE", "AMYLASE" in the last 168 hours. No results for input(s): "AMMONIA" in the last 168 hours.  CBC: Recent Labs  Lab 01/13/22 0424 01/17/22 0358  WBC 5.2 6.1  HGB 9.8* 9.3*  HCT 29.0* 27.6*  MCV 89.5 89.0  PLT 252 269    Cardiac Enzymes: No results for input(s): "CKTOTAL", "CKMB",  "CKMBINDEX", "TROPONINI" in the last 168 hours.  BNP (last 3 results) No results for input(s): "BNP" in the last 8760 hours.  ProBNP (last 3 results) No results for input(s): "PROBNP" in the last 8760 hours.  Radiological Exams: DG Chest Port 1 View  Result Date: 01/18/2022 CLINICAL DATA:  Tracheostomy in place.  Respiratory failure. EXAM: PORTABLE CHEST 1 VIEW COMPARISON:  Portable 01/09/2022. FINDINGS: 6:01 a.m. Right IJ port catheter tip remains in the upper right atrium with tracheostomy cannula tip 4 cm from the carina. There is increased retrocardiac left lower lobe opacity concerning for left lower lobe pneumonia or aspiration versus atelectasis. The remainder of the lungs are clear. The cardiac size, mediastinal silhouette are normal. Thoracic cage is intact. IMPRESSION: Increased opacity in the retrocardiac left lower lobe consistent with pneumonia, aspiration or atelectasis. Electronically Signed   By: Telford Nab M.D.   On: 01/18/2022 06:17    Assessment/Plan Active Problems:   ALS (amyotrophic lateral sclerosis) (HCC)   Septic shock (HCC)   Pseudomonas pneumonia (HCC)   Autonomic dysfunction   Acute on chronic respiratory failure with hypoxia (HCC)   Acute on chronic respiratory failure hypoxia-completed 12 out of 16-hour goal yesterday due to not wanting, stating he was tired.  Currently working on a 16-hour reattempt pressure support goal today. We will continue PMV as tolerated today.  Continue weaning per protocol and as patient tolerates.  Continue supportive care. ALS -remains a  barrier to weaning.  We will continue with supportive care. Pseudomonas pneumonia- has been treated with antibiotics, per infectious disease continue through 17 July, may need extended treatment depending on response.  Continue with supportive care. Autonomic dysfunction-remains at baseline. Dysphagia-remains high risk for recurrent aspiration and aspiration pneumonia.  Continue with aspiration  precautions.  I have personally seen and evaluated the patient, evaluated laboratory and imaging results, formulated the assessment and plan and placed orders. The Patient requires high complexity decision making with multiple systems involvement.  Rounds were done with the Respiratory Therapy Director and Staff therapists and discussed with nursing staff also.  Allyne Gee, MD [facility] Pulmonary Critical Care Medicine Sleep Medicine

## 2022-01-19 DIAGNOSIS — E46 Unspecified protein-calorie malnutrition: Secondary | ICD-10-CM | POA: Diagnosis not present

## 2022-01-19 DIAGNOSIS — A419 Sepsis, unspecified organism: Secondary | ICD-10-CM | POA: Diagnosis not present

## 2022-01-19 DIAGNOSIS — J9621 Acute and chronic respiratory failure with hypoxia: Secondary | ICD-10-CM | POA: Diagnosis not present

## 2022-01-19 DIAGNOSIS — G909 Disorder of the autonomic nervous system, unspecified: Secondary | ICD-10-CM | POA: Diagnosis not present

## 2022-01-19 DIAGNOSIS — J151 Pneumonia due to Pseudomonas: Secondary | ICD-10-CM | POA: Diagnosis not present

## 2022-01-19 DIAGNOSIS — F419 Anxiety disorder, unspecified: Secondary | ICD-10-CM | POA: Diagnosis not present

## 2022-01-19 DIAGNOSIS — J9620 Acute and chronic respiratory failure, unspecified whether with hypoxia or hypercapnia: Secondary | ICD-10-CM | POA: Diagnosis not present

## 2022-01-19 DIAGNOSIS — R1310 Dysphagia, unspecified: Secondary | ICD-10-CM | POA: Diagnosis not present

## 2022-01-19 DIAGNOSIS — R6521 Severe sepsis with septic shock: Secondary | ICD-10-CM | POA: Diagnosis not present

## 2022-01-19 DIAGNOSIS — G1221 Amyotrophic lateral sclerosis: Secondary | ICD-10-CM | POA: Diagnosis not present

## 2022-01-19 NOTE — Progress Notes (Cosign Needed)
Pulmonary Critical Care Medicine [place]   PULMONARY CRITICAL CARE SERVICE  PROGRESS NOTE     Jerry [name]  ZOX:[id number]  DOB: 19-Apr-1971   DOA: 01/08/2022  Referring Physician: Satira Sark, MD  HPI: Jerry [name] is a 51 y.o. male being followed for ventilator/airway/oxygen weaning Acute on Chronic Respiratory Failure.  Patient seen lying in bed, currently on pressure support.  Unfortunately was unable to complete 16-hour pressure support goal, will reattempt today.  Continue PMV as tolerated.  No acute distress, no acute overnight events.  Medications: Reviewed on Rounds  Physical Exam:  Vitals: Temp 98.4, pulse 87, respirations 20, BP 110/65, SPO2 100%  Ventilator Settings pressure support 12/5, FiO2 30%  General: Comfortable at this time Neck: supple Cardiovascular: no malignant arrhythmias Respiratory: Bilaterally clear Skin: no rash seen on limited exam Musculoskeletal: No gross abnormality Psychiatric:unable to assess Neurologic:no involuntary movements         Lab Data:   Basic Metabolic Panel: Recent Labs  Lab 01/13/22 0424 01/14/22 0327 01/17/22 0358  NA 138  --  138  K 3.4* 3.6 3.6  CL 110  --  106  CO2 20*  --  21*  GLUCOSE 130*  --  110*  BUN 9  --  12  CREATININE <0.30*  --  <0.30*  CALCIUM 9.0  --  9.3  MG 1.8  --  2.0    ABG: No results for input(s): "PHART", "PCO2ART", "PO2ART", "HCO3", "O2SAT" in the last 168 hours.  Liver Function Tests: No results for input(s): "AST", "ALT", "ALKPHOS", "BILITOT", "PROT", "ALBUMIN" in the last 168 hours. No results for input(s): "LIPASE", "AMYLASE" in the last 168 hours. No results for input(s): "AMMONIA" in the last 168 hours.  CBC: Recent Labs  Lab 01/13/22 0424 01/17/22 0358  WBC 5.2 6.1  HGB 9.8* 9.3*  HCT 29.0* 27.6*  MCV 89.5 89.0  PLT 252 269    Cardiac Enzymes: No results for input(s): "CKTOTAL", "CKMB", "CKMBINDEX", "TROPONINI" in the last 168  hours.  BNP (last 3 results) No results for input(s): "BNP" in the last 8760 hours.  ProBNP (last 3 results) No results for input(s): "PROBNP" in the last 8760 hours.  Radiological Exams: DG Chest Port 1 View  Result Date: 01/18/2022 CLINICAL DATA:  Tracheostomy in place.  Respiratory failure. EXAM: PORTABLE CHEST 1 VIEW COMPARISON:  Portable 01/09/2022. FINDINGS: 6:01 a.m. Right IJ port catheter tip remains in the upper right atrium with tracheostomy cannula tip 4 cm from the carina. There is increased retrocardiac left lower lobe opacity concerning for left lower lobe pneumonia or aspiration versus atelectasis. The remainder of the lungs are clear. The cardiac size, mediastinal silhouette are normal. Thoracic cage is intact. IMPRESSION: Increased opacity in the retrocardiac left lower lobe consistent with pneumonia, aspiration or atelectasis. Electronically Signed   By: Telford Nab M.D.   On: 01/18/2022 06:17    Assessment/Plan Active Problems:   ALS (amyotrophic lateral sclerosis) (HCC)   Septic shock (HCC)   Pseudomonas pneumonia (HCC)   Autonomic dysfunction   Acute on chronic respiratory failure with hypoxia (HCC)  Acute on chronic respiratory failure hypoxia-unfortunately was unsuccessful with a 16-hour pressure support goal yesterday.  Currently working on a 16-hour reattempt pressure support goal today. We will continue PMV as tolerated today.  Continue weaning per protocol and as patient tolerates.  Continue supportive care. ALS -remains a barrier to weaning.  We will continue with supportive care. Pseudomonas pneumonia- has been treated with antibiotics, antibiotics were  due to be completed on the 17th but he is on extended treatment.  Continue with supportive care. Autonomic dysfunction-remains at baseline. Dysphagia-remains high risk for recurrent aspiration and aspiration pneumonia.  Continue with aspiration precautions.   I have personally seen and evaluated the patient,  evaluated laboratory and imaging results, formulated the assessment and plan and placed orders. The Patient requires high complexity decision making with multiple systems involvement.  Rounds were done with the Respiratory Therapy Director and Staff therapists and discussed with nursing staff also.  Allyne Gee, MD [facility] Pulmonary Critical Care Medicine Sleep Medicine

## 2022-01-20 DIAGNOSIS — G1221 Amyotrophic lateral sclerosis: Secondary | ICD-10-CM | POA: Diagnosis not present

## 2022-01-20 DIAGNOSIS — J9620 Acute and chronic respiratory failure, unspecified whether with hypoxia or hypercapnia: Secondary | ICD-10-CM | POA: Diagnosis not present

## 2022-01-20 DIAGNOSIS — J9621 Acute and chronic respiratory failure with hypoxia: Secondary | ICD-10-CM | POA: Diagnosis not present

## 2022-01-20 DIAGNOSIS — F419 Anxiety disorder, unspecified: Secondary | ICD-10-CM | POA: Diagnosis not present

## 2022-01-20 DIAGNOSIS — J151 Pneumonia due to Pseudomonas: Secondary | ICD-10-CM | POA: Diagnosis not present

## 2022-01-20 DIAGNOSIS — R6521 Severe sepsis with septic shock: Secondary | ICD-10-CM | POA: Diagnosis not present

## 2022-01-20 DIAGNOSIS — E119 Type 2 diabetes mellitus without complications: Secondary | ICD-10-CM | POA: Diagnosis not present

## 2022-01-20 DIAGNOSIS — L89151 Pressure ulcer of sacral region, stage 1: Secondary | ICD-10-CM | POA: Diagnosis not present

## 2022-01-20 DIAGNOSIS — G909 Disorder of the autonomic nervous system, unspecified: Secondary | ICD-10-CM | POA: Diagnosis not present

## 2022-01-20 DIAGNOSIS — A419 Sepsis, unspecified organism: Secondary | ICD-10-CM | POA: Diagnosis not present

## 2022-01-20 LAB — BASIC METABOLIC PANEL
Anion gap: 6 (ref 5–15)
BUN: 12 mg/dL (ref 6–20)
CO2: 22 mmol/L (ref 22–32)
Calcium: 9.2 mg/dL (ref 8.9–10.3)
Chloride: 111 mmol/L (ref 98–111)
Creatinine, Ser: <0.3 mg/dL — ABNORMAL LOW (ref 0.61–1.24)
Glucose, Bld: 99 mg/dL (ref 70–99)
Potassium: 3.5 mmol/L (ref 3.5–5.1)
Sodium: 139 mmol/L (ref 135–145)

## 2022-01-20 LAB — PHOSPHORUS: Phosphorus: 3.7 mg/dL (ref 2.5–4.6)

## 2022-01-20 LAB — CBC
HCT: 27.4 % — ABNORMAL LOW (ref 39.0–52.0)
Hemoglobin: 9.1 g/dL — ABNORMAL LOW (ref 13.0–17.0)
MCH: 30.5 pg (ref 26.0–34.0)
MCHC: 33.2 g/dL (ref 30.0–36.0)
MCV: 91.9 fL (ref 80.0–100.0)
Platelets: 262 10*3/uL (ref 150–400)
RBC: 2.98 MIL/uL — ABNORMAL LOW (ref 4.22–5.81)
RDW: 13.9 % (ref 11.5–15.5)
WBC: 6 10*3/uL (ref 4.0–10.5)
nRBC: 0 % (ref 0.0–0.2)

## 2022-01-20 LAB — MAGNESIUM: Magnesium: 2.1 mg/dL (ref 1.7–2.4)

## 2022-01-20 NOTE — Progress Notes (Addendum)
Pulmonary Critical Care Medicine [place]   PULMONARY CRITICAL CARE SERVICE  PROGRESS NOTE     Jerry [name]  EHU:[id number]  DOB: 08-Nov-1970   DOA: 01/08/2022  Referring Physician: Satira Sark, MD  HPI: Jerry [name] is a 51 y.o. male being followed for ventilator/airway/oxygen weaning Acute on Chronic Respiratory Failure.  Patient seen lying in bed, family at bedside.  Currently on pressure support.  Was successful with 16-hour pressure support goal yesterday.  We will move forward with a 2-hour ATC goal and 14-hour pressure support goal today.  No acute distress, no acute overnight events.  Medications: Reviewed on Rounds  Physical Exam:  Vitals: Temp 97.1, pulse 89, respirations 20, BP 99/58, SPO2 100%  Ventilator Settings pressure support 12/5, FiO2 30%   General: Comfortable at this time Neck: supple Cardiovascular: no malignant arrhythmias Respiratory: Bilaterally clear Skin: no rash seen on limited exam Musculoskeletal: No gross abnormality Psychiatric:unable to assess Neurologic:no involuntary movements         Lab Data:   Basic Metabolic Panel: Recent Labs  Lab 01/14/22 0327 01/17/22 0358 01/20/22 0544  NA  --  138 139  K 3.6 3.6 3.5  CL  --  106 111  CO2  --  21* 22  GLUCOSE  --  110* 99  BUN  --  12 12  CREATININE  --  <0.30* <0.30*  CALCIUM  --  9.3 9.2  MG  --  2.0 2.1  PHOS  --   --  3.7    ABG: No results for input(s): "PHART", "PCO2ART", "PO2ART", "HCO3", "O2SAT" in the last 168 hours.  Liver Function Tests: No results for input(s): "AST", "ALT", "ALKPHOS", "BILITOT", "PROT", "ALBUMIN" in the last 168 hours. No results for input(s): "LIPASE", "AMYLASE" in the last 168 hours. No results for input(s): "AMMONIA" in the last 168 hours.  CBC: Recent Labs  Lab 01/17/22 0358 01/20/22 0544  WBC 6.1 6.0  HGB 9.3* 9.1*  HCT 27.6* 27.4*  MCV 89.0 91.9  PLT 269 262    Cardiac Enzymes: No results for input(s):  "CKTOTAL", "CKMB", "CKMBINDEX", "TROPONINI" in the last 168 hours.  BNP (last 3 results) No results for input(s): "BNP" in the last 8760 hours.  ProBNP (last 3 results) No results for input(s): "PROBNP" in the last 8760 hours.  Radiological Exams: No results found.  Assessment/Plan Active Problems:   ALS (amyotrophic lateral sclerosis) (HCC)   Septic shock (HCC)   Pseudomonas pneumonia (HCC)   Autonomic dysfunction   Acute on chronic respiratory failure with hypoxia (HCC)   Acute on chronic respiratory failure hypoxia-was able to successfully complete 16-hour pressure support goal yesterday.  Has a ATC 2-hour goal and pressure support 14-hour goal today.  Continue weaning per protocol and as patient tolerates.  Continue supportive care. ALS -remains a barrier to weaning.  We will continue with supportive care. Pseudomonas pneumonia- has been treated with antibiotics, antibiotics were due to be completed on the 17th but he is on extended treatment.  Continue with supportive care. Autonomic dysfunction-remains at baseline. Dysphagia-remains high risk for recurrent aspiration and aspiration pneumonia.  Continue with aspiration precautions.  I have personally seen and evaluated the patient, evaluated laboratory and imaging results, formulated the assessment and plan and placed orders. The Patient requires high complexity decision making with multiple systems involvement.  Rounds were done with the Respiratory Therapy Director and Staff therapists and discussed with nursing staff also.  Allyne Gee, MD [facility] Pulmonary Critical Care Medicine Sleep  Medicine

## 2022-01-21 ENCOUNTER — Ambulatory Visit: Payer: Self-pay

## 2022-01-21 DIAGNOSIS — J9621 Acute and chronic respiratory failure with hypoxia: Secondary | ICD-10-CM | POA: Diagnosis not present

## 2022-01-21 DIAGNOSIS — A419 Sepsis, unspecified organism: Secondary | ICD-10-CM | POA: Diagnosis not present

## 2022-01-21 DIAGNOSIS — J151 Pneumonia due to Pseudomonas: Secondary | ICD-10-CM | POA: Diagnosis not present

## 2022-01-21 DIAGNOSIS — F419 Anxiety disorder, unspecified: Secondary | ICD-10-CM | POA: Diagnosis not present

## 2022-01-21 DIAGNOSIS — L89151 Pressure ulcer of sacral region, stage 1: Secondary | ICD-10-CM | POA: Diagnosis not present

## 2022-01-21 DIAGNOSIS — R6521 Severe sepsis with septic shock: Secondary | ICD-10-CM | POA: Diagnosis not present

## 2022-01-21 DIAGNOSIS — G909 Disorder of the autonomic nervous system, unspecified: Secondary | ICD-10-CM | POA: Diagnosis not present

## 2022-01-21 DIAGNOSIS — J9620 Acute and chronic respiratory failure, unspecified whether with hypoxia or hypercapnia: Secondary | ICD-10-CM | POA: Diagnosis not present

## 2022-01-21 DIAGNOSIS — G1221 Amyotrophic lateral sclerosis: Secondary | ICD-10-CM | POA: Diagnosis not present

## 2022-01-21 DIAGNOSIS — E119 Type 2 diabetes mellitus without complications: Secondary | ICD-10-CM | POA: Diagnosis not present

## 2022-01-21 NOTE — Progress Notes (Signed)
Pulmonary Critical Care Medicine [place]   PULMONARY CRITICAL CARE SERVICE  PROGRESS NOTE     Christophor Eick  RDE:[id number]  DOB: December 29, 1970   DOA: 01/08/2022  Referring Physician: Satira Sark, MD  HPI: Marlowe Lawes is a 51 y.o. male being followed for ventilator/airway/oxygen weaning Acute on Chronic Respiratory Failure.  Patient is pressure support 30% supposed to do 4 hours of T collar  Medications: Reviewed on Rounds  Physical Exam:  Vitals: Temperature is 97.3 pulse 107 respiratory rate is 27 blood pressure 103/67 saturations 99%  Ventilator Settings on pressure support FiO2 is 30% pressure 12/5  General: Comfortable at this time Neck: supple Cardiovascular: no malignant arrhythmias Respiratory: Scattered rhonchi very coarse breath sounds Skin: no rash seen on limited exam Musculoskeletal: No gross abnormality Psychiatric:unable to assess Neurologic:no involuntary movements         Lab Data:   Basic Metabolic Panel: Recent Labs  Lab 01/17/22 0358 01/20/22 0544  NA 138 139  K 3.6 3.5  CL 106 111  CO2 21* 22  GLUCOSE 110* 99  BUN 12 12  CREATININE <0.30* <0.30*  CALCIUM 9.3 9.2  MG 2.0 2.1  PHOS  --  3.7    ABG: No results for input(s): "PHART", "PCO2ART", "PO2ART", "HCO3", "O2SAT" in the last 168 hours.  Liver Function Tests: No results for input(s): "AST", "ALT", "ALKPHOS", "BILITOT", "PROT", "ALBUMIN" in the last 168 hours. No results for input(s): "LIPASE", "AMYLASE" in the last 168 hours. No results for input(s): "AMMONIA" in the last 168 hours.  CBC: Recent Labs  Lab 01/17/22 0358 01/20/22 0544  WBC 6.1 6.0  HGB 9.3* 9.1*  HCT 27.6* 27.4*  MCV 89.0 91.9  PLT 269 262    Cardiac Enzymes: No results for input(s): "CKTOTAL", "CKMB", "CKMBINDEX", "TROPONINI" in the last 168 hours.  BNP (last 3 results) No results for input(s): "BNP" in the last 8760 hours.  ProBNP (last 3 results) No results for input(s):  "PROBNP" in the last 8760 hours.  Radiological Exams: No results found.  Assessment/Plan Active Problems:   ALS (amyotrophic lateral sclerosis) (HCC)   Septic shock (HCC)   Pseudomonas pneumonia (HCC)   Autonomic dysfunction   Acute on chronic respiratory failure with hypoxia (HCC)   Acute on chronic respiratory failure hypoxia we will continue with weaning attempts the goal is for 4 hours on the T collar ALS supportive care advanced severe disease will need nocturnal vent support at best Septic shock resolved hemodynamics are stable Pseudomonas pneumonia treated we will continue to follow along Autonomic dysfunction no change   I have personally seen and evaluated the patient, evaluated laboratory and imaging results, formulated the assessment and plan and placed orders. The Patient requires high complexity decision making with multiple systems involvement.  Rounds were done with the Respiratory Therapy Director and Staff therapists and discussed with nursing staff also.  Allyne Gee, MD [facility] Pulmonary Critical Care Medicine Sleep Medicine

## 2022-01-21 NOTE — Patient Outreach (Signed)
  Care Coordination   Follow Up Visit Note   01/21/2022 Name: Jerry [name] MRN: [id number] DOB: [birth year]  Jerry [name] is a 51 y.o. year old male who sees [name], MD for primary care. I spoke with  Gaetana Michaelis spouse by phone today.  What matters to the patients health and wellness today?  In-home services  Goals Addressed             This Visit's Progress    Assist with arranging in-home services       Care Coordination Interventions: Discussed concerns related to assistance in the home. Patient is currently completing rehab at [facility]. Discharge to be determined. Spouse reports additional nursing assistance will be required once the patient discharges. Patient was receiving PCS services via [facility] prior to hospitalization.  Patient was previously approved for 80 hrs /month. His acuity has increased d/t tracheostomy. Spouse is requesting assistance with increasing PCS hours and arranging overnight assistance once patient discharges home. Contacted [facility] [employer] to discuss options for overnight assistance. Pending return call.          SDOH assessments and interventions completed:   Yes SDOH Interventions Today    Flowsheet Row Most Recent Value  SDOH Interventions   Food Insecurity Interventions Intervention Not Indicated  Transportation Interventions Intervention Not Indicated       Care Coordination Interventions Activated:  Yes Care Coordination Interventions:  Yes, provided  Follow up plan:  Will follow up within the next week.  Encounter Outcome:  Pt. Visit Completed  [place] [facility] [phone]

## 2022-01-22 ENCOUNTER — Ambulatory Visit: Payer: Self-pay

## 2022-01-22 DIAGNOSIS — A419 Sepsis, unspecified organism: Secondary | ICD-10-CM | POA: Diagnosis not present

## 2022-01-22 DIAGNOSIS — G1221 Amyotrophic lateral sclerosis: Secondary | ICD-10-CM | POA: Diagnosis not present

## 2022-01-22 DIAGNOSIS — G909 Disorder of the autonomic nervous system, unspecified: Secondary | ICD-10-CM | POA: Diagnosis not present

## 2022-01-22 DIAGNOSIS — J9621 Acute and chronic respiratory failure with hypoxia: Secondary | ICD-10-CM | POA: Diagnosis not present

## 2022-01-22 DIAGNOSIS — R6521 Severe sepsis with septic shock: Secondary | ICD-10-CM | POA: Diagnosis not present

## 2022-01-22 DIAGNOSIS — J151 Pneumonia due to Pseudomonas: Secondary | ICD-10-CM | POA: Diagnosis not present

## 2022-01-22 NOTE — Progress Notes (Signed)
Pulmonary Critical Care Medicine [place]   PULMONARY CRITICAL CARE SERVICE  PROGRESS NOTE     Jerry [name]  [id number]  DOB: [birth year]   DOA: 01/08/2022  Referring Physician: Satira Sark, MD  HPI: Jerry [name] is a 51 y.o. male being followed for ventilator/airway/oxygen weaning Acute on Chronic Respiratory Failure.  We are continuing to attempt the patient on weaning on T collar has not been tolerating.  Patient today is supposed to do well with pressure support and then T collar  Medications: Reviewed on Rounds  Physical Exam:  Vitals: Temperature is 97.1 pulse of 86 respiratory rate is 21 blood pressure 135/74 saturations 99%  Ventilator Settings on pressure control 30% tidal volume 500 PEEP of 5  General: Comfortable at this time Neck: supple Cardiovascular: no malignant arrhythmias Respiratory: Scattered rhonchi very coarse breath sounds Skin: no rash seen on limited exam Musculoskeletal: No gross abnormality Psychiatric:unable to assess Neurologic:no involuntary movements         Lab Data:   Basic Metabolic Panel: Recent Labs  Lab 01/17/22 0358 01/20/22 0544  NA 138 139  K 3.6 3.5  CL 106 111  CO2 21* 22  GLUCOSE 110* 99  BUN 12 12  CREATININE <0.30* <0.30*  CALCIUM 9.3 9.2  MG 2.0 2.1  PHOS  --  3.7    ABG: No results for input(s): "PHART", "PCO2ART", "PO2ART", "HCO3", "O2SAT" in the last 168 hours.  Liver Function Tests: No results for input(s): "AST", "ALT", "ALKPHOS", "BILITOT", "PROT", "ALBUMIN" in the last 168 hours. No results for input(s): "LIPASE", "AMYLASE" in the last 168 hours. No results for input(s): "AMMONIA" in the last 168 hours.  CBC: Recent Labs  Lab 01/17/22 0358 01/20/22 0544  WBC 6.1 6.0  HGB 9.3* 9.1*  HCT 27.6* 27.4*  MCV 89.0 91.9  PLT 269 262    Cardiac Enzymes: No results for input(s): "CKTOTAL", "CKMB", "CKMBINDEX", "TROPONINI" in the last 168 hours.  BNP (last 3 results) No  results for input(s): "BNP" in the last 8760 hours.  ProBNP (last 3 results) No results for input(s): "PROBNP" in the last 8760 hours.  Radiological Exams: No results found.  Assessment/Plan Active Problems:   ALS (amyotrophic lateral sclerosis) (HCC)   Septic shock (HCC)   Pseudomonas pneumonia (HCC)   Autonomic dysfunction   Acute on chronic respiratory failure with hypoxia (HCC)   Acute on chronic respiratory failure with hypoxia try to wean for T collar 4 hours if patient is able to tolerate. Sepsis with shock hemodynamics are stable Pseudomonas pneumonia treated we will continue to monitor Autonomic dysfunction supportive care ALS advanced severe end-stage disease Tracheostomy status we will need to keep in place   I have personally seen and evaluated the patient, evaluated laboratory and imaging results, formulated the assessment and plan and placed orders. The Patient requires high complexity decision making with multiple systems involvement.  Rounds were done with the Respiratory Therapy Director and Staff therapists and discussed with nursing staff also.  Allyne Gee, MD [facility] Pulmonary Critical Care Medicine Sleep Medicine

## 2022-01-23 DIAGNOSIS — J9620 Acute and chronic respiratory failure, unspecified whether with hypoxia or hypercapnia: Secondary | ICD-10-CM | POA: Diagnosis not present

## 2022-01-23 DIAGNOSIS — J9621 Acute and chronic respiratory failure with hypoxia: Secondary | ICD-10-CM | POA: Diagnosis not present

## 2022-01-23 DIAGNOSIS — R6521 Severe sepsis with septic shock: Secondary | ICD-10-CM | POA: Diagnosis not present

## 2022-01-23 DIAGNOSIS — A419 Sepsis, unspecified organism: Secondary | ICD-10-CM | POA: Diagnosis not present

## 2022-01-23 DIAGNOSIS — G909 Disorder of the autonomic nervous system, unspecified: Secondary | ICD-10-CM | POA: Diagnosis not present

## 2022-01-23 DIAGNOSIS — J95851 Ventilator associated pneumonia: Secondary | ICD-10-CM | POA: Diagnosis not present

## 2022-01-23 DIAGNOSIS — G1221 Amyotrophic lateral sclerosis: Secondary | ICD-10-CM | POA: Diagnosis not present

## 2022-01-23 DIAGNOSIS — J151 Pneumonia due to Pseudomonas: Secondary | ICD-10-CM | POA: Diagnosis not present

## 2022-01-23 DIAGNOSIS — E119 Type 2 diabetes mellitus without complications: Secondary | ICD-10-CM | POA: Diagnosis not present

## 2022-01-23 LAB — CULTURE, RESPIRATORY W GRAM STAIN

## 2022-01-23 NOTE — Progress Notes (Signed)
Pulmonary Critical Care Medicine [facility] [place]   PULMONARY CRITICAL CARE SERVICE  PROGRESS NOTE     Jerry [name]  [id number]  DOB: 1971-05-31   DOA: 01/08/2022  Referring Physician: Luna Kitchens, MD  HPI: Jerry [name] is a 52 y.o. male being followed for ventilator/airway/oxygen weaning Acute on Chronic Respiratory Failure.  Patient is currently on pressure support mode has been on 30% FiO2 with good saturations  Medications: Reviewed on Rounds  Physical Exam:  Vitals: Temperature is 97.7 pulse 88 respiratory is 20 blood pressure 116/80 saturations 100%  Ventilator Settings pressure support FiO2 30% tidal volume 416 pressure 12/5  General: Comfortable at this time Neck: supple Cardiovascular: no malignant arrhythmias Respiratory: Scattered rhonchi very coarse breath sounds Skin: no rash seen on limited exam Musculoskeletal: No gross abnormality Psychiatric:unable to assess Neurologic:no involuntary movements         Lab Data:   Basic Metabolic Panel: Recent Labs  Lab 01/17/22 0358 01/20/22 0544  NA 138 139  K 3.6 3.5  CL 106 111  CO2 21* 22  GLUCOSE 110* 99  BUN 12 12  CREATININE <0.30* <0.30*  CALCIUM 9.3 9.2  MG 2.0 2.1  PHOS  --  3.7    ABG: No results for input(s): "PHART", "PCO2ART", "PO2ART", "HCO3", "O2SAT" in the last 168 hours.  Liver Function Tests: No results for input(s): "AST", "ALT", "ALKPHOS", "BILITOT", "PROT", "ALBUMIN" in the last 168 hours. No results for input(s): "LIPASE", "AMYLASE" in the last 168 hours. No results for input(s): "AMMONIA" in the last 168 hours.  CBC: Recent Labs  Lab 01/17/22 0358 01/20/22 0544  WBC 6.1 6.0  HGB 9.3* 9.1*  HCT 27.6* 27.4*  MCV 89.0 91.9  PLT 269 262    Cardiac Enzymes: No results for input(s): "CKTOTAL", "CKMB", "CKMBINDEX", "TROPONINI" in the last 168 hours.  BNP (last 3 results) No results for input(s): "BNP" in the last 8760 hours.  ProBNP (last 3  results) No results for input(s): "PROBNP" in the last 8760 hours.  Radiological Exams: No results found.  Assessment/Plan Active Problems:   ALS (amyotrophic lateral sclerosis) (HCC)   Septic shock (HCC)   Pseudomonas pneumonia (HCC)   Autonomic dysfunction   Acute on chronic respiratory failure with hypoxia (HCC)   Acute on chronic respiratory failure hypoxia plan is going to be to continue with the pressure support wean we will continue with secretion management supportive care.  Patient is supposed to do T collar trials Sepsis with shock supportive care Pseudomonas pneumonia has been treated Autonomic dysfunction no change we will continue to follow along ALS patient is at baseline   I have personally seen and evaluated the patient, evaluated laboratory and imaging results, formulated the assessment and plan and placed orders. The Patient requires high complexity decision making with multiple systems involvement.  Rounds were done with the Respiratory Therapy Director and Staff therapists and discussed with nursing staff also.  Yevonne Pax, MD [facility] Pulmonary Critical Care Medicine Sleep Medicine

## 2022-01-24 DIAGNOSIS — J151 Pneumonia due to Pseudomonas: Secondary | ICD-10-CM | POA: Diagnosis not present

## 2022-01-24 DIAGNOSIS — G909 Disorder of the autonomic nervous system, unspecified: Secondary | ICD-10-CM | POA: Diagnosis not present

## 2022-01-24 DIAGNOSIS — G1221 Amyotrophic lateral sclerosis: Secondary | ICD-10-CM | POA: Diagnosis not present

## 2022-01-24 DIAGNOSIS — J9621 Acute and chronic respiratory failure with hypoxia: Secondary | ICD-10-CM | POA: Diagnosis not present

## 2022-01-24 DIAGNOSIS — E46 Unspecified protein-calorie malnutrition: Secondary | ICD-10-CM | POA: Diagnosis not present

## 2022-01-24 DIAGNOSIS — J9620 Acute and chronic respiratory failure, unspecified whether with hypoxia or hypercapnia: Secondary | ICD-10-CM | POA: Diagnosis not present

## 2022-01-24 DIAGNOSIS — A419 Sepsis, unspecified organism: Secondary | ICD-10-CM | POA: Diagnosis not present

## 2022-01-24 DIAGNOSIS — F419 Anxiety disorder, unspecified: Secondary | ICD-10-CM | POA: Diagnosis not present

## 2022-01-24 DIAGNOSIS — R1310 Dysphagia, unspecified: Secondary | ICD-10-CM | POA: Diagnosis not present

## 2022-01-24 DIAGNOSIS — R6521 Severe sepsis with septic shock: Secondary | ICD-10-CM | POA: Diagnosis not present

## 2022-01-24 LAB — RENAL FUNCTION PANEL
Albumin: 3.1 g/dL — ABNORMAL LOW (ref 3.5–5.0)
Anion gap: 8 (ref 5–15)
BUN: 10 mg/dL (ref 6–20)
CO2: 21 mmol/L — ABNORMAL LOW (ref 22–32)
Calcium: 9.3 mg/dL (ref 8.9–10.3)
Chloride: 110 mmol/L (ref 98–111)
Creatinine, Ser: <0.3 mg/dL — ABNORMAL LOW (ref 0.61–1.24)
Glucose, Bld: 92 mg/dL (ref 70–99)
Phosphorus: 3.1 mg/dL (ref 2.5–4.6)
Potassium: 3.7 mmol/L (ref 3.5–5.1)
Sodium: 139 mmol/L (ref 135–145)

## 2022-01-24 LAB — CBC
HCT: 27.7 % — ABNORMAL LOW (ref 39.0–52.0)
Hemoglobin: 9.2 g/dL — ABNORMAL LOW (ref 13.0–17.0)
MCH: 29.9 pg (ref 26.0–34.0)
MCHC: 33.2 g/dL (ref 30.0–36.0)
MCV: 89.9 fL (ref 80.0–100.0)
Platelets: 206 10*3/uL (ref 150–400)
RBC: 3.08 MIL/uL — ABNORMAL LOW (ref 4.22–5.81)
RDW: 13.7 % (ref 11.5–15.5)
WBC: 6.1 10*3/uL (ref 4.0–10.5)
nRBC: 0 % (ref 0.0–0.2)

## 2022-01-24 LAB — MAGNESIUM: Magnesium: 2.1 mg/dL (ref 1.7–2.4)

## 2022-01-24 NOTE — Progress Notes (Signed)
Pulmonary Critical Care Medicine [place]   PULMONARY CRITICAL CARE SERVICE  PROGRESS NOTE     Jerry [name]  [id number]:[id number]  DOB: April 09, 1971   DOA: 01/08/2022  Referring Physician: Satira Sark, MD  HPI: Jerry [name] is a 51 y.o. male being followed for ventilator/airway/oxygen weaning Acute on Chronic Respiratory Failure.  Patient is refusing T collar has been doing okay with pressure support  Medications: Reviewed on Rounds  Physical Exam:  Vitals: Temperature is 99.3 pulse 95 respiratory does 19 blood pressure 102/59 saturations 100%  Ventilator Settings on pressure support FiO2 is 30% pressure 12/5  General: Comfortable at this time Neck: supple Cardiovascular: no malignant arrhythmias Respiratory: Scattered rhonchi expansion is equal Skin: no rash seen on limited exam Musculoskeletal: No gross abnormality Psychiatric:unable to assess Neurologic:no involuntary movements         Lab Data:   Basic Metabolic Panel: Recent Labs  Lab 01/20/22 0544 01/24/22 0700  NA 139 139  K 3.5 3.7  CL 111 110  CO2 22 21*  GLUCOSE 99 92  BUN 12 10  CREATININE <0.30* <0.30*  CALCIUM 9.2 9.3  MG 2.1  --   PHOS 3.7 3.1    ABG: No results for input(s): "PHART", "PCO2ART", "PO2ART", "HCO3", "O2SAT" in the last 168 hours.  Liver Function Tests: Recent Labs  Lab 01/24/22 0700  ALBUMIN 3.1*   No results for input(s): "LIPASE", "AMYLASE" in the last 168 hours. No results for input(s): "AMMONIA" in the last 168 hours.  CBC: Recent Labs  Lab 01/20/22 0544 01/24/22 0700  WBC 6.0 6.1  HGB 9.1* 9.2*  HCT 27.4* 27.7*  MCV 91.9 89.9  PLT 262 206    Cardiac Enzymes: No results for input(s): "CKTOTAL", "CKMB", "CKMBINDEX", "TROPONINI" in the last 168 hours.  BNP (last 3 results) No results for input(s): "BNP" in the last 8760 hours.  ProBNP (last 3 results) No results for input(s): "PROBNP" in the last 8760 hours.  Radiological Exams: No  results found.  Assessment/Plan Active Problems:   ALS (amyotrophic lateral sclerosis) (HCC)   Septic shock (HCC)   Pseudomonas pneumonia (HCC)   Autonomic dysfunction   Acute on chronic respiratory failure with hypoxia (HCC)   Acute on chronic respiratory failure with possible fluid in past with recommended weaning parameters ALS advanced severe disease continue to follow along Severe sepsis shock hemodynamics are stable and resolved Autonomic dysfunction has been stable Similar pneumonia has been treated with antibiotics we will continue to monitor   I have personally seen and evaluated the patient, evaluated laboratory and imaging results, formulated the assessment and plan and placed orders. The Patient requires high complexity decision making with multiple systems involvement.  Rounds were done with the Respiratory Therapy Director and Staff therapists and discussed with nursing staff also.  Allyne Gee, MD [facility] Pulmonary Critical Care Medicine Sleep Medicine

## 2022-01-25 DIAGNOSIS — R6521 Severe sepsis with septic shock: Secondary | ICD-10-CM | POA: Diagnosis not present

## 2022-01-25 DIAGNOSIS — G1221 Amyotrophic lateral sclerosis: Secondary | ICD-10-CM | POA: Diagnosis not present

## 2022-01-25 DIAGNOSIS — J151 Pneumonia due to Pseudomonas: Secondary | ICD-10-CM | POA: Diagnosis not present

## 2022-01-25 DIAGNOSIS — E119 Type 2 diabetes mellitus without complications: Secondary | ICD-10-CM | POA: Diagnosis not present

## 2022-01-25 DIAGNOSIS — A419 Sepsis, unspecified organism: Secondary | ICD-10-CM | POA: Diagnosis not present

## 2022-01-25 DIAGNOSIS — F419 Anxiety disorder, unspecified: Secondary | ICD-10-CM | POA: Diagnosis not present

## 2022-01-25 DIAGNOSIS — J9620 Acute and chronic respiratory failure, unspecified whether with hypoxia or hypercapnia: Secondary | ICD-10-CM | POA: Diagnosis not present

## 2022-01-25 DIAGNOSIS — J9621 Acute and chronic respiratory failure with hypoxia: Secondary | ICD-10-CM | POA: Diagnosis not present

## 2022-01-25 DIAGNOSIS — G909 Disorder of the autonomic nervous system, unspecified: Secondary | ICD-10-CM | POA: Diagnosis not present

## 2022-01-25 DIAGNOSIS — L89151 Pressure ulcer of sacral region, stage 1: Secondary | ICD-10-CM | POA: Diagnosis not present

## 2022-01-25 NOTE — Progress Notes (Signed)
Pulmonary Critical Care Medicine [place]   PULMONARY CRITICAL CARE SERVICE  PROGRESS NOTE     Chetan Mehring  [id number]:[id number]  DOB: 18-Jan-1971   DOA: 01/08/2022  Referring Physician: Satira Sark, MD  HPI: Jonael Paradiso is a 51 y.o. male being followed for ventilator/airway/oxygen weaning Acute on Chronic Respiratory Failure.  Patient is comfortable without distress remains on assist control mode currently is on 30% FiO2 with good saturations  Medications: Reviewed on Rounds  Physical Exam:  Vitals: Temperature is 98.0 pulse 84 respiratory rate is 21 blood pressure 109/63 saturations 100%  Ventilator Settings: Assist-control FiO2 is 30% tidal volume 500 PEEP 5  General: Comfortable at this time Neck: supple Cardiovascular: no malignant arrhythmias Respiratory: Scattered rhonchi very coarse breath sounds Skin: no rash seen on limited exam Musculoskeletal: No gross abnormality Psychiatric:unable to assess Neurologic:no involuntary movements         Lab Data:   Basic Metabolic Panel: Recent Labs  Lab 01/20/22 0544 01/24/22 0700  NA 139 139  K 3.5 3.7  CL 111 110  CO2 22 21*  GLUCOSE 99 92  BUN 12 10  CREATININE <0.30* <0.30*  CALCIUM 9.2 9.3  MG 2.1 2.1  PHOS 3.7 3.1    ABG: No results for input(s): "PHART", "PCO2ART", "PO2ART", "HCO3", "O2SAT" in the last 168 hours.  Liver Function Tests: Recent Labs  Lab 01/24/22 0700  ALBUMIN 3.1*   No results for input(s): "LIPASE", "AMYLASE" in the last 168 hours. No results for input(s): "AMMONIA" in the last 168 hours.  CBC: Recent Labs  Lab 01/20/22 0544 01/24/22 0700  WBC 6.0 6.1  HGB 9.1* 9.2*  HCT 27.4* 27.7*  MCV 91.9 89.9  PLT 262 206    Cardiac Enzymes: No results for input(s): "CKTOTAL", "CKMB", "CKMBINDEX", "TROPONINI" in the last 168 hours.  BNP (last 3 results) No results for input(s): "BNP" in the last 8760 hours.  ProBNP (last 3 results) No results for input(s):  "PROBNP" in the last 8760 hours.  Radiological Exams: No results found.  Assessment/Plan Active Problems:   ALS (amyotrophic lateral sclerosis) (HCC)   Septic shock (HCC)   Pseudomonas pneumonia (HCC)   Autonomic dysfunction   Acute on chronic respiratory failure with hypoxia (HCC)   Acute on chronic respiratory failure hypoxia we will continue with attempting weaning did about 10 hours of pressure support yesterday while encourage compliance with recommended weaning however patient gets fatigued easily.  As previously mentioned we will need baseline ventilator support Sepsis with shock resolved hemodynamics stable ALS advanced severe disease Autonomic dysfunction no change Pseudomonas pneumonia has been treated   I have personally seen and evaluated the patient, evaluated laboratory and imaging results, formulated the assessment and plan and placed orders. The Patient requires high complexity decision making with multiple systems involvement.  Rounds were done with the Respiratory Therapy Director and Staff therapists and discussed with nursing staff also.  Allyne Gee, MD [facility] Pulmonary Critical Care Medicine Sleep Medicine

## 2022-01-26 ENCOUNTER — Other Ambulatory Visit (HOSPITAL_COMMUNITY): Payer: Self-pay

## 2022-01-26 DIAGNOSIS — L89151 Pressure ulcer of sacral region, stage 1: Secondary | ICD-10-CM | POA: Diagnosis not present

## 2022-01-26 DIAGNOSIS — J9621 Acute and chronic respiratory failure with hypoxia: Secondary | ICD-10-CM | POA: Diagnosis not present

## 2022-01-26 DIAGNOSIS — J151 Pneumonia due to Pseudomonas: Secondary | ICD-10-CM | POA: Diagnosis not present

## 2022-01-26 DIAGNOSIS — J9620 Acute and chronic respiratory failure, unspecified whether with hypoxia or hypercapnia: Secondary | ICD-10-CM | POA: Diagnosis not present

## 2022-01-26 DIAGNOSIS — K567 Ileus, unspecified: Secondary | ICD-10-CM | POA: Diagnosis not present

## 2022-01-26 DIAGNOSIS — E119 Type 2 diabetes mellitus without complications: Secondary | ICD-10-CM | POA: Diagnosis not present

## 2022-01-26 DIAGNOSIS — R6521 Severe sepsis with septic shock: Secondary | ICD-10-CM | POA: Diagnosis not present

## 2022-01-26 DIAGNOSIS — A419 Sepsis, unspecified organism: Secondary | ICD-10-CM | POA: Diagnosis not present

## 2022-01-26 DIAGNOSIS — F419 Anxiety disorder, unspecified: Secondary | ICD-10-CM | POA: Diagnosis not present

## 2022-01-26 DIAGNOSIS — K6389 Other specified diseases of intestine: Secondary | ICD-10-CM | POA: Diagnosis not present

## 2022-01-26 DIAGNOSIS — G909 Disorder of the autonomic nervous system, unspecified: Secondary | ICD-10-CM | POA: Diagnosis not present

## 2022-01-26 DIAGNOSIS — G1221 Amyotrophic lateral sclerosis: Secondary | ICD-10-CM | POA: Diagnosis not present

## 2022-01-26 NOTE — Progress Notes (Signed)
Pulmonary Critical Care Medicine [place]   PULMONARY CRITICAL CARE SERVICE  PROGRESS NOTE     Jerry [name]  [id number]:[id number]  DOB: Feb 20, 1971   DOA: 01/08/2022  Referring Physician: Satira Sark, MD  HPI: Jerry [name] is a 51 y.o. male being followed for ventilator/airway/oxygen weaning Acute on Chronic Respiratory Failure.  Patient is on assist control mode currently on 30% FiO2 saturations are good goal is for T collar for 8 hours  Medications: Reviewed on Rounds  Physical Exam:  Vitals: Temperature 97.8 pulse 74 respiratory 26 blood pressure was 115/51 saturations 98%  Ventilator Settings assist-control FiO2 is 30% tidal volume 830 PEEP 5  General: Comfortable at this time Neck: supple Cardiovascular: no malignant arrhythmias Respiratory: Scattered rhonchi expansion is equal Skin: no rash seen on limited exam Musculoskeletal: No gross abnormality Psychiatric:unable to assess Neurologic:no involuntary movements         Lab Data:   Basic Metabolic Panel: Recent Labs  Lab 01/20/22 0544 01/24/22 0700  NA 139 139  K 3.5 3.7  CL 111 110  CO2 22 21*  GLUCOSE 99 92  BUN 12 10  CREATININE <0.30* <0.30*  CALCIUM 9.2 9.3  MG 2.1 2.1  PHOS 3.7 3.1    ABG: No results for input(s): "PHART", "PCO2ART", "PO2ART", "HCO3", "O2SAT" in the last 168 hours.  Liver Function Tests: Recent Labs  Lab 01/24/22 0700  ALBUMIN 3.1*   No results for input(s): "LIPASE", "AMYLASE" in the last 168 hours. No results for input(s): "AMMONIA" in the last 168 hours.  CBC: Recent Labs  Lab 01/20/22 0544 01/24/22 0700  WBC 6.0 6.1  HGB 9.1* 9.2*  HCT 27.4* 27.7*  MCV 91.9 89.9  PLT 262 206    Cardiac Enzymes: No results for input(s): "CKTOTAL", "CKMB", "CKMBINDEX", "TROPONINI" in the last 168 hours.  BNP (last 3 results) No results for input(s): "BNP" in the last 8760 hours.  ProBNP (last 3 results) No results for input(s): "PROBNP" in the last  8760 hours.  Radiological Exams: No results found.  Assessment/Plan Active Problems:   ALS (amyotrophic lateral sclerosis) (HCC)   Septic shock (HCC)   Pseudomonas pneumonia (HCC)   Autonomic dysfunction   Acute on chronic respiratory failure with hypoxia (HCC)   Acute on chronic respiratory failure hypoxia plan is to continue on the weaning protocol goal is for 8 hours today Sepsis with shock resolved hemodynamics are stable Pseudomonas pneumonia has been treated with antibiotics Autonomic dysfunction hemodynamics are virtually been relatively stable ALS severe advanced disease continue with supportive care   I have personally seen and evaluated the patient, evaluated laboratory and imaging results, formulated the assessment and plan and placed orders. The Patient requires high complexity decision making with multiple systems involvement.  Rounds were done with the Respiratory Therapy Director and Staff therapists and discussed with nursing staff also.  Allyne Gee, MD [facility] Pulmonary Critical Care Medicine Sleep Medicine

## 2022-01-27 DIAGNOSIS — G1221 Amyotrophic lateral sclerosis: Secondary | ICD-10-CM | POA: Diagnosis not present

## 2022-01-27 DIAGNOSIS — J9620 Acute and chronic respiratory failure, unspecified whether with hypoxia or hypercapnia: Secondary | ICD-10-CM | POA: Diagnosis not present

## 2022-01-27 DIAGNOSIS — R6521 Severe sepsis with septic shock: Secondary | ICD-10-CM | POA: Diagnosis not present

## 2022-01-27 DIAGNOSIS — E119 Type 2 diabetes mellitus without complications: Secondary | ICD-10-CM | POA: Diagnosis not present

## 2022-01-27 DIAGNOSIS — J9621 Acute and chronic respiratory failure with hypoxia: Secondary | ICD-10-CM | POA: Diagnosis not present

## 2022-01-27 DIAGNOSIS — A419 Sepsis, unspecified organism: Secondary | ICD-10-CM | POA: Diagnosis not present

## 2022-01-27 DIAGNOSIS — J95851 Ventilator associated pneumonia: Secondary | ICD-10-CM | POA: Diagnosis not present

## 2022-01-27 DIAGNOSIS — J151 Pneumonia due to Pseudomonas: Secondary | ICD-10-CM | POA: Diagnosis not present

## 2022-01-27 DIAGNOSIS — G909 Disorder of the autonomic nervous system, unspecified: Secondary | ICD-10-CM | POA: Diagnosis not present

## 2022-01-27 NOTE — Progress Notes (Signed)
Pulmonary Critical Care Medicine [name]   PULMONARY CRITICAL CARE SERVICE  PROGRESS NOTE     Jerry [name]  EQA:[id number]  DOB: [birth year]   DOA: 01/08/2022  Referring Physician: Satira Sark, MD  HPI: Jerry [name] is a 51 y.o. male being followed for ventilator/airway/oxygen weaning Acute on Chronic Respiratory Failure.  Patient is on assist control mode appears to be resting comfortably without distress has been on 30% FiO2  Medications: Reviewed on Rounds  Physical Exam:  Vitals: Temperature is 98 pulse 77 respiratory rate 28 saturations 99%  Ventilator Settings assist-control FiO2 is 30% tidal volume 500 PEEP of 5  General: Comfortable at this time Neck: supple Cardiovascular: no malignant arrhythmias Respiratory: No rhonchi no rales are noted at this time Skin: no rash seen on limited exam Musculoskeletal: No gross abnormality Psychiatric:unable to assess Neurologic:no involuntary movements         Lab Data:   Basic Metabolic Panel: Recent Labs  Lab 01/24/22 0700  NA 139  K 3.7  CL 110  CO2 21*  GLUCOSE 92  BUN 10  CREATININE <0.30*  CALCIUM 9.3  MG 2.1  PHOS 3.1    ABG: No results for input(s): "PHART", "PCO2ART", "PO2ART", "HCO3", "O2SAT" in the last 168 hours.  Liver Function Tests: Recent Labs  Lab 01/24/22 0700  ALBUMIN 3.1*   No results for input(s): "LIPASE", "AMYLASE" in the last 168 hours. No results for input(s): "AMMONIA" in the last 168 hours.  CBC: Recent Labs  Lab 01/24/22 0700  WBC 6.1  HGB 9.2*  HCT 27.7*  MCV 89.9  PLT 206    Cardiac Enzymes: No results for input(s): "CKTOTAL", "CKMB", "CKMBINDEX", "TROPONINI" in the last 168 hours.  BNP (last 3 results) No results for input(s): "BNP" in the last 8760 hours.  ProBNP (last 3 results) No results for input(s): "PROBNP" in the last 8760 hours.  Radiological Exams: DG Abd Portable 1V  Result Date: 01/26/2022 CLINICAL DATA:  Fecal  impaction suspected in a 51 year old male. EXAM: PORTABLE ABDOMEN - 1 VIEW COMPARISON:  January 08, 2022. FINDINGS: Diffuse gaseous distension of small and large bowel. G-tube projects over the LEFT upper quadrant. EKG leads project over the abdomen. Increased number of gas-filled bowel loops suspected on current imaging. There is some stool and gas in the region of the rectum. Soft tissues are grossly unremarkable. No acute skeletal process to the extent evaluated. IMPRESSION: Increasing number of gas-filled loops of small bowel in the abdomen likely associated with mild increased generalized distension of both large and small bowel. Findings favor worsening global ileus. Moderate amount of stool in the area of the rectum is nonspecific. Could reflect early fecal impaction. Electronically Signed   By: Zetta Bills M.D.   On: 01/26/2022 12:14    Assessment/Plan Active Problems:   ALS (amyotrophic lateral sclerosis) (HCC)   Septic shock (HCC)   Pseudomonas pneumonia (HCC)   Autonomic dysfunction   Acute on chronic respiratory failure with hypoxia (HCC)   Acute on chronic respiratory failure with hypoxia patient yesterday did not complete the target wean we will go to reassess again today and try to get him to do the T collar once again. Severe sepsis and shock supportive care Pseudomonas pneumonia has been treated with antibiotics ALS advanced severe disease Autonomic dysfunction no change   I have personally seen and evaluated the patient, evaluated laboratory and imaging results, formulated the assessment and plan and placed orders. The Patient requires high complexity decision making with  multiple systems involvement.  Rounds were done with the Respiratory Therapy Director and Staff therapists and discussed with nursing staff also.  Allyne Gee, MD [facility] Pulmonary Critical Care Medicine Sleep Medicine

## 2022-01-28 DIAGNOSIS — R6521 Severe sepsis with septic shock: Secondary | ICD-10-CM | POA: Diagnosis not present

## 2022-01-28 DIAGNOSIS — J9621 Acute and chronic respiratory failure with hypoxia: Secondary | ICD-10-CM | POA: Diagnosis not present

## 2022-01-28 DIAGNOSIS — F419 Anxiety disorder, unspecified: Secondary | ICD-10-CM | POA: Diagnosis not present

## 2022-01-28 DIAGNOSIS — E46 Unspecified protein-calorie malnutrition: Secondary | ICD-10-CM | POA: Diagnosis not present

## 2022-01-28 DIAGNOSIS — G909 Disorder of the autonomic nervous system, unspecified: Secondary | ICD-10-CM | POA: Diagnosis not present

## 2022-01-28 DIAGNOSIS — J151 Pneumonia due to Pseudomonas: Secondary | ICD-10-CM | POA: Diagnosis not present

## 2022-01-28 DIAGNOSIS — R1310 Dysphagia, unspecified: Secondary | ICD-10-CM | POA: Diagnosis not present

## 2022-01-28 DIAGNOSIS — G1221 Amyotrophic lateral sclerosis: Secondary | ICD-10-CM | POA: Diagnosis not present

## 2022-01-28 DIAGNOSIS — J9620 Acute and chronic respiratory failure, unspecified whether with hypoxia or hypercapnia: Secondary | ICD-10-CM | POA: Diagnosis not present

## 2022-01-28 DIAGNOSIS — A419 Sepsis, unspecified organism: Secondary | ICD-10-CM | POA: Diagnosis not present

## 2022-01-28 LAB — BASIC METABOLIC PANEL
Anion gap: 6 (ref 5–15)
BUN: 19 mg/dL (ref 6–20)
CO2: 25 mmol/L (ref 22–32)
Calcium: 9.5 mg/dL (ref 8.9–10.3)
Chloride: 108 mmol/L (ref 98–111)
Creatinine, Ser: <0.3 mg/dL — ABNORMAL LOW (ref 0.61–1.24)
Glucose, Bld: 103 mg/dL — ABNORMAL HIGH (ref 70–99)
Potassium: 3.9 mmol/L (ref 3.5–5.1)
Sodium: 139 mmol/L (ref 135–145)

## 2022-01-28 LAB — CBC
HCT: 29.4 % — ABNORMAL LOW (ref 39.0–52.0)
Hemoglobin: 9.5 g/dL — ABNORMAL LOW (ref 13.0–17.0)
MCH: 29.5 pg (ref 26.0–34.0)
MCHC: 32.3 g/dL (ref 30.0–36.0)
MCV: 91.3 fL (ref 80.0–100.0)
Platelets: 258 10*3/uL (ref 150–400)
RBC: 3.22 MIL/uL — ABNORMAL LOW (ref 4.22–5.81)
RDW: 13.6 % (ref 11.5–15.5)
WBC: 5.8 10*3/uL (ref 4.0–10.5)
nRBC: 0 % (ref 0.0–0.2)

## 2022-01-28 LAB — MAGNESIUM: Magnesium: 1.9 mg/dL (ref 1.7–2.4)

## 2022-01-28 NOTE — Progress Notes (Addendum)
Pulmonary Critical Care Medicine [place]   PULMONARY CRITICAL CARE SERVICE  PROGRESS NOTE     Jerry [name]  [id number]:[id number]  DOB: 1971-06-10   DOA: 01/08/2022  Referring Physician: Satira Sark, MD  HPI: Jerry [name] is a 51 y.o. male being followed for ventilator/airway/oxygen weaning Acute on Chronic Respiratory Failure.  Patient seen lying in bed, currently on full support.  He was able to complete 8 hours of ATC yesterday.  According to respiratory staff he has been declining weaning multiple days.  I did have a discussion with patient today about how important it is to continue weaning as he can tolerate.  He does type through his device that he is tired.  Did acknowledge his feelings.  We agreed that he would try to push through and that as long as he is safe is okay to be tired.  No acute distress, no acute overnight events.  Medications: Reviewed on Rounds  Physical Exam:  Vitals: Temp 97.5, pulse 83, respirations 18, BP 94/44, SPO2 100%  Ventilator Settings AC VC, FiO2 30%, tidal volume 500, rate 16, PEEP 5  General: Comfortable at this time Neck: supple Cardiovascular: no malignant arrhythmias Respiratory: Bilaterally clear Skin: no rash seen on limited exam Musculoskeletal: No gross abnormality Psychiatric:unable to assess Neurologic:no involuntary movements         Lab Data:   Basic Metabolic Panel: Recent Labs  Lab 01/24/22 0700 01/28/22 0219  NA 139 139  K 3.7 3.9  CL 110 108  CO2 21* 25  GLUCOSE 92 103*  BUN 10 19  CREATININE <0.30* <0.30*  CALCIUM 9.3 9.5  MG 2.1 1.9  PHOS 3.1  --     ABG: No results for input(s): "PHART", "PCO2ART", "PO2ART", "HCO3", "O2SAT" in the last 168 hours.  Liver Function Tests: Recent Labs  Lab 01/24/22 0700  ALBUMIN 3.1*   No results for input(s): "LIPASE", "AMYLASE" in the last 168 hours. No results for input(s): "AMMONIA" in the last 168 hours.  CBC: Recent Labs  Lab  01/24/22 0700 01/28/22 0219  WBC 6.1 5.8  HGB 9.2* 9.5*  HCT 27.7* 29.4*  MCV 89.9 91.3  PLT 206 258    Cardiac Enzymes: No results for input(s): "CKTOTAL", "CKMB", "CKMBINDEX", "TROPONINI" in the last 168 hours.  BNP (last 3 results) No results for input(s): "BNP" in the last 8760 hours.  ProBNP (last 3 results) No results for input(s): "PROBNP" in the last 8760 hours.  Radiological Exams: No results found.  Assessment/Plan Active Problems:   ALS (amyotrophic lateral sclerosis) (HCC)   Septic shock (HCC)   Pseudomonas pneumonia (HCC)   Autonomic dysfunction   Acute on chronic respiratory failure with hypoxia (HCC)   Acute on chronic respiratory failure with hypoxia-patient completed 8-hour ATC goal yesterday has a 12-hour ATC goal today.  Continue weaning per protocol.  Continue supportive care. Pseudomonas pneumonia has been treated with antibiotics.  Continue with supportive care. ALS -disease is severe and advanced.  This may make it challenging to wean.  Continue with supportive care. Autonomic dysfunction -no changes noted.   I have personally seen and evaluated the patient, evaluated laboratory and imaging results, formulated the assessment and plan and placed orders. The Patient requires high complexity decision making with multiple systems involvement.  Rounds were done with the Respiratory Therapy Director and Staff therapists and discussed with nursing staff also.  Allyne Gee, MD [facility] Pulmonary Critical Care Medicine Sleep Medicine

## 2022-01-29 DIAGNOSIS — J9621 Acute and chronic respiratory failure with hypoxia: Secondary | ICD-10-CM | POA: Diagnosis not present

## 2022-01-29 DIAGNOSIS — A419 Sepsis, unspecified organism: Secondary | ICD-10-CM | POA: Diagnosis not present

## 2022-01-29 DIAGNOSIS — G909 Disorder of the autonomic nervous system, unspecified: Secondary | ICD-10-CM | POA: Diagnosis not present

## 2022-01-29 DIAGNOSIS — J151 Pneumonia due to Pseudomonas: Secondary | ICD-10-CM | POA: Diagnosis not present

## 2022-01-29 DIAGNOSIS — R6521 Severe sepsis with septic shock: Secondary | ICD-10-CM | POA: Diagnosis not present

## 2022-01-29 DIAGNOSIS — G1221 Amyotrophic lateral sclerosis: Secondary | ICD-10-CM | POA: Diagnosis not present

## 2022-01-30 ENCOUNTER — Other Ambulatory Visit (HOSPITAL_COMMUNITY): Payer: Self-pay

## 2022-01-30 DIAGNOSIS — I878 Other specified disorders of veins: Secondary | ICD-10-CM | POA: Diagnosis not present

## 2022-01-30 DIAGNOSIS — K6389 Other specified diseases of intestine: Secondary | ICD-10-CM | POA: Diagnosis not present

## 2022-01-30 DIAGNOSIS — J9620 Acute and chronic respiratory failure, unspecified whether with hypoxia or hypercapnia: Secondary | ICD-10-CM | POA: Diagnosis not present

## 2022-01-30 DIAGNOSIS — K5939 Other megacolon: Secondary | ICD-10-CM | POA: Diagnosis not present

## 2022-01-30 DIAGNOSIS — L89151 Pressure ulcer of sacral region, stage 1: Secondary | ICD-10-CM | POA: Diagnosis not present

## 2022-01-30 DIAGNOSIS — F419 Anxiety disorder, unspecified: Secondary | ICD-10-CM | POA: Diagnosis not present

## 2022-01-30 DIAGNOSIS — E119 Type 2 diabetes mellitus without complications: Secondary | ICD-10-CM | POA: Diagnosis not present

## 2022-01-30 NOTE — Progress Notes (Cosign Needed)
Pulmonary Critical Care Medicine [place]   PULMONARY CRITICAL CARE SERVICE  PROGRESS NOTE     Cyree Chuong  HQP:[id number]  DOB: 1970-08-08   DOA: 01/08/2022  Referring Physician: Satira Sark, MD  HPI: Joshus Rogan is a 51 y.o. male being followed for ventilator/airway/oxygen weaning Acute on Chronic Respiratory Failure.  Patient seen lying in bed, currently on full support.  Was able to do 1 hour ATC out of 12-hour goal yesterday.  Was on pressure support the other 11 hours.  We will reattempt with a 12-hour ATC goal today.  According to respiratory therapy patient is stating that he is too tired to attempt ATC.  Once again we discussed that as long as he is safe and is okay to be tired while weaning.  No acute distress, no acute overnight events.  Medications: Reviewed on Rounds  Physical Exam:  Vitals: Temp 97.3, pulse 81, respirations 20, BP 154/33, SPO2 100%  Ventilator Settings AC VC, rate 16, FiO2 20%, tidal volume 500, PEEP 5  General: Comfortable at this time Neck: supple Cardiovascular: no malignant arrhythmias Respiratory: Bilaterally clear Skin: no rash seen on limited exam Musculoskeletal: No gross abnormality Psychiatric:unable to assess Neurologic:no involuntary movements         Lab Data:   Basic Metabolic Panel: Recent Labs  Lab 01/24/22 0700 01/28/22 0219  NA 139 139  K 3.7 3.9  CL 110 108  CO2 21* 25  GLUCOSE 92 103*  BUN 10 19  CREATININE <0.30* <0.30*  CALCIUM 9.3 9.5  MG 2.1 1.9  PHOS 3.1  --     ABG: No results for input(s): "PHART", "PCO2ART", "PO2ART", "HCO3", "O2SAT" in the last 168 hours.  Liver Function Tests: Recent Labs  Lab 01/24/22 0700  ALBUMIN 3.1*   No results for input(s): "LIPASE", "AMYLASE" in the last 168 hours. No results for input(s): "AMMONIA" in the last 168 hours.  CBC: Recent Labs  Lab 01/24/22 0700 01/28/22 0219  WBC 6.1 5.8  HGB 9.2* 9.5*  HCT 27.7* 29.4*  MCV 89.9 91.3   PLT 206 258    Cardiac Enzymes: No results for input(s): "CKTOTAL", "CKMB", "CKMBINDEX", "TROPONINI" in the last 168 hours.  BNP (last 3 results) No results for input(s): "BNP" in the last 8760 hours.  ProBNP (last 3 results) No results for input(s): "PROBNP" in the last 8760 hours.  Radiological Exams: No results found.  Assessment/Plan Active Problems:   ALS (amyotrophic lateral sclerosis) (HCC)   Septic shock (HCC)   Pseudomonas pneumonia (HCC)   Autonomic dysfunction   Acute on chronic respiratory failure with hypoxia (HCC)   Acute on chronic respiratory failure with hypoxia-patient completed only 1 hour out of 12-hour ATC goal yesterday.  We will continue with the reattempt of a 12-hour ATC goal today.  Continue weaning per protocol.  Continue supportive care. Pseudomonas pneumonia has been treated with antibiotics.  Continue with supportive care. ALS -disease is severe and advanced.  This may make it challenging to wean.  Continue with supportive care. Autonomic dysfunction -no changes noted.   I have personally seen and evaluated the patient, evaluated laboratory and imaging results, formulated the assessment and plan and placed orders. The Patient requires high complexity decision making with multiple systems involvement.  Rounds were done with the Respiratory Therapy Director and Staff therapists and discussed with nursing staff also.  Allyne Gee, MD [facility] Pulmonary Critical Care Medicine Sleep Medicine

## 2022-01-31 ENCOUNTER — Other Ambulatory Visit (HOSPITAL_COMMUNITY): Payer: Self-pay

## 2022-01-31 DIAGNOSIS — E119 Type 2 diabetes mellitus without complications: Secondary | ICD-10-CM | POA: Diagnosis not present

## 2022-01-31 DIAGNOSIS — F419 Anxiety disorder, unspecified: Secondary | ICD-10-CM | POA: Diagnosis not present

## 2022-01-31 DIAGNOSIS — L89151 Pressure ulcer of sacral region, stage 1: Secondary | ICD-10-CM | POA: Diagnosis not present

## 2022-01-31 DIAGNOSIS — G909 Disorder of the autonomic nervous system, unspecified: Secondary | ICD-10-CM | POA: Diagnosis not present

## 2022-01-31 DIAGNOSIS — A419 Sepsis, unspecified organism: Secondary | ICD-10-CM | POA: Diagnosis not present

## 2022-01-31 DIAGNOSIS — J151 Pneumonia due to Pseudomonas: Secondary | ICD-10-CM | POA: Diagnosis not present

## 2022-01-31 DIAGNOSIS — J9620 Acute and chronic respiratory failure, unspecified whether with hypoxia or hypercapnia: Secondary | ICD-10-CM | POA: Diagnosis not present

## 2022-01-31 DIAGNOSIS — R6521 Severe sepsis with septic shock: Secondary | ICD-10-CM | POA: Diagnosis not present

## 2022-01-31 DIAGNOSIS — G1221 Amyotrophic lateral sclerosis: Secondary | ICD-10-CM | POA: Diagnosis not present

## 2022-01-31 DIAGNOSIS — J9621 Acute and chronic respiratory failure with hypoxia: Secondary | ICD-10-CM | POA: Diagnosis not present

## 2022-01-31 DIAGNOSIS — K6389 Other specified diseases of intestine: Secondary | ICD-10-CM | POA: Diagnosis not present

## 2022-01-31 DIAGNOSIS — R140 Abdominal distension (gaseous): Secondary | ICD-10-CM | POA: Diagnosis not present

## 2022-01-31 LAB — CBC
HCT: 29.9 % — ABNORMAL LOW (ref 39.0–52.0)
Hemoglobin: 10 g/dL — ABNORMAL LOW (ref 13.0–17.0)
MCH: 29.7 pg (ref 26.0–34.0)
MCHC: 33.4 g/dL (ref 30.0–36.0)
MCV: 88.7 fL (ref 80.0–100.0)
Platelets: 224 10*3/uL (ref 150–400)
RBC: 3.37 MIL/uL — ABNORMAL LOW (ref 4.22–5.81)
RDW: 13.8 % (ref 11.5–15.5)
WBC: 6.4 10*3/uL (ref 4.0–10.5)
nRBC: 0 % (ref 0.0–0.2)

## 2022-01-31 LAB — BASIC METABOLIC PANEL
Anion gap: 7 (ref 5–15)
BUN: 18 mg/dL (ref 6–20)
CO2: 22 mmol/L (ref 22–32)
Calcium: 9.6 mg/dL (ref 8.9–10.3)
Chloride: 110 mmol/L (ref 98–111)
Creatinine, Ser: <0.3 mg/dL — ABNORMAL LOW (ref 0.61–1.24)
Glucose, Bld: 105 mg/dL — ABNORMAL HIGH (ref 70–99)
Potassium: 3.3 mmol/L — ABNORMAL LOW (ref 3.5–5.1)
Sodium: 139 mmol/L (ref 135–145)

## 2022-01-31 LAB — MAGNESIUM: Magnesium: 1.8 mg/dL (ref 1.7–2.4)

## 2022-01-31 NOTE — Progress Notes (Signed)
Pulmonary Critical Care Medicine [place]   PULMONARY CRITICAL CARE SERVICE  PROGRESS NOTE     Jerry [name]  UKG:[id number]  DOB: 05-14-1971   DOA: 01/08/2022  Referring Physician: Satira Sark, MD  HPI: Jerry [name] is a 51 y.o. male being followed for ventilator/airway/oxygen weaning Acute on Chronic Respiratory Failure. ***  Medications: Reviewed on Rounds  Physical Exam:  Vitals: ***  Ventilator Settings ***  General: Comfortable at this time Neck: supple Cardiovascular: no malignant arrhythmias Respiratory: *** Skin: no rash seen on limited exam Musculoskeletal: No gross abnormality Psychiatric:unable to assess Neurologic:no involuntary movements         Lab Data:   Basic Metabolic Panel: Recent Labs  Lab 01/28/22 0219 01/31/22 1505  NA 139 139  K 3.9 3.3*  CL 108 110  CO2 25 22  GLUCOSE 103* 105*  BUN 19 18  CREATININE <0.30* <0.30*  CALCIUM 9.5 9.6  MG 1.9 1.8    ABG: No results for input(s): "PHART", "PCO2ART", "PO2ART", "HCO3", "O2SAT" in the last 168 hours.  Liver Function Tests: No results for input(s): "AST", "ALT", "ALKPHOS", "BILITOT", "PROT", "ALBUMIN" in the last 168 hours. No results for input(s): "LIPASE", "AMYLASE" in the last 168 hours. No results for input(s): "AMMONIA" in the last 168 hours.  CBC: Recent Labs  Lab 01/28/22 0219 01/31/22 1505  WBC 5.8 6.4  HGB 9.5* 10.0*  HCT 29.4* 29.9*  MCV 91.3 88.7  PLT 258 224    Cardiac Enzymes: No results for input(s): "CKTOTAL", "CKMB", "CKMBINDEX", "TROPONINI" in the last 168 hours.  BNP (last 3 results) No results for input(s): "BNP" in the last 8760 hours.  ProBNP (last 3 results) No results for input(s): "PROBNP" in the last 8760 hours.  Radiological Exams: DG Abd Portable 1V  Result Date: 01/31/2022 CLINICAL DATA:  Follow-up abdominal distension/ileus. EXAM: PORTABLE ABDOMEN - 1 VIEW COMPARISON:  01/30/2022 and prior studies FINDINGS: There  has been decrease in gaseous distension of colon and small bowel. The RIGHT colon now measures 8.5 cm in greatest diameter, previously 10 cm. Largest small bowel loop now measures 2.8 cm, previously 4.2 cm. A percutaneous gastrostomy tube is identified. IMPRESSION: Decreased gaseous distension of colon and small bowel. Electronically Signed   By: Margarette Canada M.D.   On: 01/31/2022 12:08   DG Abd Portable 1V  Result Date: 01/30/2022 CLINICAL DATA:  Abdominal distension. EXAM: PORTABLE ABDOMEN - 1 VIEW COMPARISON:  Abdominal x-ray 01/26/2022 FINDINGS: There is diffuse gaseous dilatation of small and large bowel diffusely. Degree of distention has increased when compared to the prior examination. The right colon is dilated measuring up to 10 cm. Small bowel loops are dilated measuring up to 4 cm. There are no suspicious calcifications. There are phleboliths in the pelvis. Free air can not be excluded on a supine view. No obvious fracture. IMPRESSION: 1. Diffuse gaseous dilatation of small and large bowel. Bowel has increased in size when compared to the prior examination worrisome for worsening ileus. Distal large bowel obstruction not excluded. Continued follow-up recommended. Electronically Signed   By: Ronney Asters M.D.   On: 01/30/2022 20:05    Assessment/Plan Active Problems:   ALS (amyotrophic lateral sclerosis) (HCC)   Septic shock (HCC)   Pseudomonas pneumonia (HCC)   Autonomic dysfunction   Acute on chronic respiratory failure with hypoxia (HCC)   *** *** *** *** ***   I have personally seen and evaluated the patient, evaluated laboratory and imaging results, formulated the assessment and plan  and placed orders. The Patient requires high complexity decision making with multiple systems involvement.  Rounds were done with the Respiratory Therapy Director and Staff therapists and discussed with nursing staff also.  Allyne Gee, MD [facility] Pulmonary Critical Care Medicine Sleep Medicine

## 2022-02-01 ENCOUNTER — Inpatient Hospital Stay: Payer: Medicare HMO | Admitting: Pulmonary Disease

## 2022-02-01 ENCOUNTER — Other Ambulatory Visit (HOSPITAL_COMMUNITY): Payer: Self-pay

## 2022-02-01 DIAGNOSIS — G909 Disorder of the autonomic nervous system, unspecified: Secondary | ICD-10-CM | POA: Diagnosis not present

## 2022-02-01 DIAGNOSIS — K567 Ileus, unspecified: Secondary | ICD-10-CM | POA: Diagnosis not present

## 2022-02-01 DIAGNOSIS — J151 Pneumonia due to Pseudomonas: Secondary | ICD-10-CM | POA: Diagnosis not present

## 2022-02-01 DIAGNOSIS — A419 Sepsis, unspecified organism: Secondary | ICD-10-CM | POA: Diagnosis not present

## 2022-02-01 DIAGNOSIS — K6389 Other specified diseases of intestine: Secondary | ICD-10-CM | POA: Diagnosis not present

## 2022-02-01 DIAGNOSIS — Z452 Encounter for adjustment and management of vascular access device: Secondary | ICD-10-CM | POA: Diagnosis not present

## 2022-02-01 DIAGNOSIS — E119 Type 2 diabetes mellitus without complications: Secondary | ICD-10-CM | POA: Diagnosis not present

## 2022-02-01 DIAGNOSIS — J9620 Acute and chronic respiratory failure, unspecified whether with hypoxia or hypercapnia: Secondary | ICD-10-CM | POA: Diagnosis not present

## 2022-02-01 DIAGNOSIS — G1221 Amyotrophic lateral sclerosis: Secondary | ICD-10-CM | POA: Diagnosis not present

## 2022-02-01 DIAGNOSIS — F419 Anxiety disorder, unspecified: Secondary | ICD-10-CM | POA: Diagnosis not present

## 2022-02-01 DIAGNOSIS — J9621 Acute and chronic respiratory failure with hypoxia: Secondary | ICD-10-CM | POA: Diagnosis not present

## 2022-02-01 DIAGNOSIS — L89151 Pressure ulcer of sacral region, stage 1: Secondary | ICD-10-CM | POA: Diagnosis not present

## 2022-02-01 DIAGNOSIS — R6521 Severe sepsis with septic shock: Secondary | ICD-10-CM | POA: Diagnosis not present

## 2022-02-01 LAB — POTASSIUM: Potassium: 3.1 mmol/L — ABNORMAL LOW (ref 3.5–5.1)

## 2022-02-01 NOTE — Progress Notes (Deleted)
Synopsis: Referred in July 2023 for long term mechanical ventilatory support in the setting of ALS. 51 y/o male with ALS admitted on 12/09/21 in the setting of acute hypoxemic respiratory failure requiring intubation and mechanical ventilation.  He had a brief cardiac arrest in the ER around the time of intubation. He had another cardiac arrest in the ICU on the day of admission.    Subjective:   PATIENT ID: Jerry [name] GENDER: male DOB: July 22, 1970, MRN: [id number]   HPI  No chief complaint on file.   ***  Past Medical History:  Diagnosis Date   ALS (amyotrophic lateral sclerosis) ([place])    COVID-19 virus infection 06/2020   Meningitis 2003   spinal   Morbid obesity ([place])    Prediabetes 11/05/2016   A1C 5.7 on 11/05/16     Family History  Problem Relation Age of Onset   Diabetes Father    Diabetes Mother    Diabetes Sister    Hypertension Sister    Diabetes Brother    Diabetes Sister    Fibromyalgia Sister    Diabetes Sister      Social History   Socioeconomic History   Marital status: Married    Spouse name: [name]   Number of children: 3   Years of education: Not on file   Highest education level: High school graduate  Occupational History   Occupation: Mining engineer    Comment: self employed   Occupation: Ship broker    Comment: [employer]  Tobacco Use   Smoking status: Never   Smokeless tobacco: Never  Scientific laboratory technician Use: Never used  Substance and Sexual Activity   Alcohol use: No    Alcohol/week: 0.0 standard drinks of alcohol   Drug use: No   Sexual activity: Yes    Partners: Female  Other Topics Concern   Not on file  Social History Narrative   Lives with wife and 3 children   Approved for disability 02/2019 for ALS    Social Determinants of Health   Financial Resource Strain: Low Risk  (09/04/2020)   Overall Financial Resource Strain (CARDIA)    Difficulty of Paying Living Expenses: Not very hard  Food Insecurity: No Food Insecurity  (01/21/2022)   Hunger Vital Sign    Worried About Running Out of Food in the Last Year: Never true    [place] in the Last Year: Never true  Transportation Needs: No Transportation Needs (01/21/2022)   PRAPARE - Hydrologist (Medical): No    Lack of Transportation (Non-Medical): No  Physical Activity: Inactive (09/04/2020)   Exercise Vital Sign    Days of Exercise per Week: 0 days    Minutes of Exercise per Session: 0 min  Stress: No Stress Concern Present (09/04/2020)   [place]    Feeling of Stress : Not at all  Social Connections: Moderately Isolated (09/04/2020)   Social Connection and Isolation Panel [NHANES]    Frequency of Communication with Friends and Family: More than three times a week    Frequency of Social Gatherings with Friends and Family: More than three times a week    Attends Religious Services: Never    Marine scientist or Organizations: No    Attends Archivist Meetings: Never    Marital Status: Married  Human resources officer Violence: Not At Risk (09/04/2020)   Humiliation, Afraid, Rape, and Kick questionnaire  Fear of Current or Ex-Partner: No    Emotionally Abused: No    Physically Abused: No    Sexually Abused: No     Allergies  Allergen Reactions   Crab [Shellfish Allergy] Rash     Outpatient Medications Prior to Visit  Medication Sig Dispense Refill   Baclofen 5 MG TABS Place 5 mg into feeding tube 3 (three) times daily as needed for muscle spasms.     bisacodyl (DULCOLAX) 10 MG suppository Place 10 mg rectally daily as needed for severe constipation.     enoxaparin (LOVENOX) 40 MG/0.4ML injection Inject 0.4 mLs (40 mg total) into the skin daily. DVT prophylaxis 0 mL    hydrOXYzine (ATARAX) 25 MG tablet Place 1 tablet (25 mg total) into feeding tube 2 (two) times daily as needed for anxiety. 30 tablet 0   hydrOXYzine (ATARAX) 50 MG tablet Place  1 tablet (50 mg total) into feeding tube at bedtime. 30 tablet 0   melatonin 5 MG TABS Place 1 tablet (5 mg total) into feeding tube at bedtime.  0   Nutritional Supplements (FEEDING SUPPLEMENT, KATE FARMS STANDARD 1.4,) LIQD liquid Place 65 mL/hr into feeding tube every 8 (eight) hours.     pantoprazole sodium (PROTONIX) 40 mg Place 40 mg into feeding tube daily.     polyethylene glycol powder (GLYCOLAX/MIRALAX) 17 GM/SCOOP powder Place 17 g into feeding tube daily as needed for constipation.     scopolamine (TRANSDERM-SCOP) 1 MG/3DAYS Place 1 patch (1.5 mg total) onto the skin every 3 (three) days. 10 patch 0   Sennosides (SENNA) 8.8 MG/5ML LIQD Take 10 mLs by mouth daily as needed for constipation.     tobramycin, PF, (TOBI) 300 MG/5ML nebulizer solution Take 5 mLs (300 mg total) by nebulization 2 (two) times daily. 280 mL 0   No facility-administered medications prior to visit.    ROS    Objective:  Physical Exam   There were no vitals filed for this visit.  ***  CBC    Component Value Date/Time   WBC 6.4 01/31/2022 1505   RBC 3.37 (L) 01/31/2022 1505   HGB 10.0 (L) 01/31/2022 1505   HCT 29.9 (L) 01/31/2022 1505   PLT 224 01/31/2022 1505   MCV 88.7 01/31/2022 1505   MCH 29.7 01/31/2022 1505   MCHC 33.4 01/31/2022 1505   RDW 13.8 01/31/2022 1505   LYMPHSABS 1.5 12/26/2021 0314   MONOABS 0.3 12/26/2021 0314   EOSABS 0.1 12/26/2021 0314   BASOSABS 0.0 12/26/2021 0314     Chest imaging:  PFT:  Labs:  Path:  Echo:  Heart Catheterization:       Assessment & Plan:   No diagnosis found.  Discussion: ***   No current outpatient medications on file.

## 2022-02-01 NOTE — Progress Notes (Cosign Needed)
Pulmonary Critical Care Medicine [place]   PULMONARY CRITICAL CARE SERVICE  PROGRESS NOTE     Jerry [name]  AYT:[id number]  DOB: [birth year]   DOA: 01/08/2022  Referring Physician: Satira Sark, MD  HPI: Jerry [name] is a 51 y.o. male being followed for ventilator/airway/oxygen weaning Acute on Chronic Respiratory Failure.  Patient seen lying in bed, currently remains on full support.  Unfortunately he has been found to have an ileus, and somehow affecting his respiratory drive, he did require bagging from respiratory therapy this morning and FiO2 needs have been increased.  Medications: Reviewed on Rounds  Physical Exam:  Vitals: Temp 97.2, pulse 97, respirations 27, BP 101/68, SPO2 99%  Ventilator Settings AC VC, FiO2 40%, tidal volume 500, rate 16, PEEP 5  General: Comfortable at this time Neck: supple Cardiovascular: no malignant arrhythmias Respiratory: Bilaterally clear Skin: no rash seen on limited exam Musculoskeletal: No gross abnormality Psychiatric:unable to assess Neurologic:no involuntary movements         Lab Data:   Basic Metabolic Panel: Recent Labs  Lab 01/28/22 0219 01/31/22 1505 02/01/22 1002  NA 139 139  --   K 3.9 3.3* 3.1*  CL 108 110  --   CO2 25 22  --   GLUCOSE 103* 105*  --   BUN 19 18  --   CREATININE <0.30* <0.30*  --   CALCIUM 9.5 9.6  --   MG 1.9 1.8  --     ABG: No results for input(s): "PHART", "PCO2ART", "PO2ART", "HCO3", "O2SAT" in the last 168 hours.  Liver Function Tests: No results for input(s): "AST", "ALT", "ALKPHOS", "BILITOT", "PROT", "ALBUMIN" in the last 168 hours. No results for input(s): "LIPASE", "AMYLASE" in the last 168 hours. No results for input(s): "AMMONIA" in the last 168 hours.  CBC: Recent Labs  Lab 01/28/22 0219 01/31/22 1505  WBC 5.8 6.4  HGB 9.5* 10.0*  HCT 29.4* 29.9*  MCV 91.3 88.7  PLT 258 224    Cardiac Enzymes: No results for input(s): "CKTOTAL", "CKMB",  "CKMBINDEX", "TROPONINI" in the last 168 hours.  BNP (last 3 results) No results for input(s): "BNP" in the last 8760 hours.  ProBNP (last 3 results) No results for input(s): "PROBNP" in the last 8760 hours.  Radiological Exams: DG Abd 1 View  Result Date: 02/01/2022 CLINICAL DATA:  Follow-up of ileus EXAM: ABDOMEN - 1 VIEW COMPARISON:  Yesterday FINDINGS: Multiple leads project over the abdomen. A central line terminates over the mid right atrium. Cardiomegaly. Left hemidiaphragm elevation. Gaseous distention of small bowel loops is relatively similar. Example small bowel loop in the left abdomen at 3.0 cm versus 2.8 cm on the prior. Normal caliber, gas-filled colon. Example ascending colon 6.2 cm versus 8.5 cm. No gross free intraperitoneal air. Low pelvis excluded. Gastrostomy tube. IMPRESSION: Similar mild gaseous distention of small bowel loops, favoring adynamic ileus. Electronically Signed   By: Abigail Miyamoto M.D.   On: 02/01/2022 10:58   DG Abd Portable 1V  Result Date: 01/31/2022 CLINICAL DATA:  Follow-up abdominal distension/ileus. EXAM: PORTABLE ABDOMEN - 1 VIEW COMPARISON:  01/30/2022 and prior studies FINDINGS: There has been decrease in gaseous distension of colon and small bowel. The RIGHT colon now measures 8.5 cm in greatest diameter, previously 10 cm. Largest small bowel loop now measures 2.8 cm, previously 4.2 cm. A percutaneous gastrostomy tube is identified. IMPRESSION: Decreased gaseous distension of colon and small bowel. Electronically Signed   By: Margarette Canada M.D.   On: 01/31/2022  12:08   DG Abd Portable 1V  Result Date: 01/30/2022 CLINICAL DATA:  Abdominal distension. EXAM: PORTABLE ABDOMEN - 1 VIEW COMPARISON:  Abdominal x-ray 01/26/2022 FINDINGS: There is diffuse gaseous dilatation of small and large bowel diffusely. Degree of distention has increased when compared to the prior examination. The right colon is dilated measuring up to 10 cm. Small bowel loops are dilated  measuring up to 4 cm. There are no suspicious calcifications. There are phleboliths in the pelvis. Free air can not be excluded on a supine view. No obvious fracture. IMPRESSION: 1. Diffuse gaseous dilatation of small and large bowel. Bowel has increased in size when compared to the prior examination worrisome for worsening ileus. Distal large bowel obstruction not excluded. Continued follow-up recommended. Electronically Signed   By: Ronney Asters M.D.   On: 01/30/2022 20:05    Assessment/Plan Active Problems:   ALS (amyotrophic lateral sclerosis) (HCC)   Septic shock (HCC)   Pseudomonas pneumonia (HCC)   Autonomic dysfunction   Acute on chronic respiratory failure with hypoxia (HCC)   Acute on chronic respiratory failure hypoxia-remains on full support due to increased FiO2 needs and bagging intervention by respiratory therapy this morning.  We will continue weaning per protocol tomorrow. Continue with supportive care. Severe sepsis and shock-has resolved.  Hemodynamics remained stable. Pseudomonas pneumonia -has been treated with antibiotics we will continue with supportive care- Autonomic dysfunction- no change overall.  Has been more stable. ALS -advanced severe disease end-stage.  May be a barrier for decannulation.   I have personally seen and evaluated the patient, evaluated laboratory and imaging results, formulated the assessment and plan and placed orders. The Patient requires high complexity decision making with multiple systems involvement.  Rounds were done with the Respiratory Therapy Director and Staff therapists and discussed with nursing staff also.  Allyne Gee, MD [facility] Pulmonary Critical Care Medicine Sleep Medicine

## 2022-02-02 ENCOUNTER — Other Ambulatory Visit (HOSPITAL_COMMUNITY): Payer: Self-pay

## 2022-02-02 DIAGNOSIS — F419 Anxiety disorder, unspecified: Secondary | ICD-10-CM | POA: Diagnosis not present

## 2022-02-02 DIAGNOSIS — G909 Disorder of the autonomic nervous system, unspecified: Secondary | ICD-10-CM | POA: Diagnosis not present

## 2022-02-02 DIAGNOSIS — J9621 Acute and chronic respiratory failure with hypoxia: Secondary | ICD-10-CM | POA: Diagnosis not present

## 2022-02-02 DIAGNOSIS — J151 Pneumonia due to Pseudomonas: Secondary | ICD-10-CM | POA: Diagnosis not present

## 2022-02-02 DIAGNOSIS — K6389 Other specified diseases of intestine: Secondary | ICD-10-CM | POA: Diagnosis not present

## 2022-02-02 DIAGNOSIS — R6521 Severe sepsis with septic shock: Secondary | ICD-10-CM | POA: Diagnosis not present

## 2022-02-02 DIAGNOSIS — J9620 Acute and chronic respiratory failure, unspecified whether with hypoxia or hypercapnia: Secondary | ICD-10-CM | POA: Diagnosis not present

## 2022-02-02 DIAGNOSIS — G1221 Amyotrophic lateral sclerosis: Secondary | ICD-10-CM | POA: Diagnosis not present

## 2022-02-02 DIAGNOSIS — A419 Sepsis, unspecified organism: Secondary | ICD-10-CM | POA: Diagnosis not present

## 2022-02-02 DIAGNOSIS — L89151 Pressure ulcer of sacral region, stage 1: Secondary | ICD-10-CM | POA: Diagnosis not present

## 2022-02-02 DIAGNOSIS — K567 Ileus, unspecified: Secondary | ICD-10-CM | POA: Diagnosis not present

## 2022-02-02 DIAGNOSIS — E119 Type 2 diabetes mellitus without complications: Secondary | ICD-10-CM | POA: Diagnosis not present

## 2022-02-02 LAB — BASIC METABOLIC PANEL
Anion gap: 10 (ref 5–15)
BUN: 9 mg/dL (ref 6–20)
CO2: 18 mmol/L — ABNORMAL LOW (ref 22–32)
Calcium: 9.6 mg/dL (ref 8.9–10.3)
Chloride: 113 mmol/L — ABNORMAL HIGH (ref 98–111)
Creatinine, Ser: <0.3 mg/dL — ABNORMAL LOW (ref 0.61–1.24)
Glucose, Bld: 80 mg/dL (ref 70–99)
Potassium: 3.6 mmol/L (ref 3.5–5.1)
Sodium: 141 mmol/L (ref 135–145)

## 2022-02-02 LAB — MAGNESIUM: Magnesium: 1.8 mg/dL (ref 1.7–2.4)

## 2022-02-02 NOTE — Progress Notes (Cosign Needed)
Pulmonary Critical Care Medicine [place]   PULMONARY CRITICAL CARE SERVICE  PROGRESS NOTE     Jerry [name]  [id number]:[id number]  DOB: 1971/04/17   DOA: 01/08/2022  Referring Physician: Satira Sark, MD  HPI: Jerry [name] is a 51 y.o. male being followed for ventilator/airway/oxygen weaning Acute on Chronic Respiratory Failure.  Patient seen lying in bed, currently on full support.  Did not wean yesterday.  According to respiratory therapy family is starting to work on education as they plan on taking him home vent dependent.  No acute distress, no acute overnight events.  Medications: Reviewed on Rounds  Physical Exam:  Vitals: Temp 98.5, pulse 83, respirations 18, BP 118/65, SPO2 99%  Ventilator Settings AC VC, FiO2 40%, rate 16, tidal volume 500, PEEP 5  General: Comfortable at this time Neck: supple Cardiovascular: no malignant arrhythmias Respiratory: Bilaterally clear Skin: no rash seen on limited exam Musculoskeletal: No gross abnormality Psychiatric:unable to assess Neurologic:no involuntary movements         Lab Data:   Basic Metabolic Panel: Recent Labs  Lab 01/28/22 0219 01/31/22 1505 02/01/22 1002 02/02/22 0352  NA 139 139  --  141  K 3.9 3.3* 3.1* 3.6  CL 108 110  --  113*  CO2 25 22  --  18*  GLUCOSE 103* 105*  --  80  BUN 19 18  --  9  CREATININE <0.30* <0.30*  --  <0.30*  CALCIUM 9.5 9.6  --  9.6  MG 1.9 1.8  --  1.8    ABG: No results for input(s): "PHART", "PCO2ART", "PO2ART", "HCO3", "O2SAT" in the last 168 hours.  Liver Function Tests: No results for input(s): "AST", "ALT", "ALKPHOS", "BILITOT", "PROT", "ALBUMIN" in the last 168 hours. No results for input(s): "LIPASE", "AMYLASE" in the last 168 hours. No results for input(s): "AMMONIA" in the last 168 hours.  CBC: Recent Labs  Lab 01/28/22 0219 01/31/22 1505  WBC 5.8 6.4  HGB 9.5* 10.0*  HCT 29.4* 29.9*  MCV 91.3 88.7  PLT 258 224    Cardiac  Enzymes: No results for input(s): "CKTOTAL", "CKMB", "CKMBINDEX", "TROPONINI" in the last 168 hours.  BNP (last 3 results) No results for input(s): "BNP" in the last 8760 hours.  ProBNP (last 3 results) No results for input(s): "PROBNP" in the last 8760 hours.  Radiological Exams: DG Abd 1 View  Result Date: 02/02/2022 CLINICAL DATA:  Ileus EXAM: ABDOMEN - 1 VIEW COMPARISON:  Yesterday FINDINGS: Diffuse gaseous distension of bowel consistent with history of ileus. Progressive distension of the ascending colon with nearly 10 cm diameter at the hepatic flexure. Percutaneous gastrostomy tube. No concerning mass effect or gas collection. IMPRESSION: Continued ileus pattern with further distension of the proximal colon. Electronically Signed   By: Jorje Guild M.D.   On: 02/02/2022 06:29   DG Abd 1 View  Result Date: 02/01/2022 CLINICAL DATA:  Follow-up of ileus EXAM: ABDOMEN - 1 VIEW COMPARISON:  Yesterday FINDINGS: Multiple leads project over the abdomen. A central line terminates over the mid right atrium. Cardiomegaly. Left hemidiaphragm elevation. Gaseous distention of small bowel loops is relatively similar. Example small bowel loop in the left abdomen at 3.0 cm versus 2.8 cm on the prior. Normal caliber, gas-filled colon. Example ascending colon 6.2 cm versus 8.5 cm. No gross free intraperitoneal air. Low pelvis excluded. Gastrostomy tube. IMPRESSION: Similar mild gaseous distention of small bowel loops, favoring adynamic ileus. Electronically Signed   By: Adria Devon.D.  On: 02/01/2022 10:58    Assessment/Plan Active Problems:   ALS (amyotrophic lateral sclerosis) (HCC)   Septic shock (HCC)   Pseudomonas pneumonia (HCC)   Autonomic dysfunction   Acute on chronic respiratory failure with hypoxia (HCC)   Acute on chronic respiratory failure hypoxia-remains on full support.  According to respiratory therapy family is anticipating patient be discharged vent dependent.  Continue  weaning per protocol.  Continue with supportive care. Severe sepsis and shock-has resolved.  Hemodynamics remained stable. Pseudomonas pneumonia -has been treated with antibiotics we will continue with supportive care- Autonomic dysfunction- no change overall.  Has been more stable. ALS -advanced severe disease end-stage.  May be a barrier for decannulation.   I have personally seen and evaluated the patient, evaluated laboratory and imaging results, formulated the assessment and plan and placed orders. The Patient requires high complexity decision making with multiple systems involvement.  Rounds were done with the Respiratory Therapy Director and Staff therapists and discussed with nursing staff also.  Allyne Gee, MD [facility] Pulmonary Critical Care Medicine Sleep Medicine

## 2022-02-03 DIAGNOSIS — L89151 Pressure ulcer of sacral region, stage 1: Secondary | ICD-10-CM | POA: Diagnosis not present

## 2022-02-03 DIAGNOSIS — J9621 Acute and chronic respiratory failure with hypoxia: Secondary | ICD-10-CM | POA: Diagnosis not present

## 2022-02-03 DIAGNOSIS — J151 Pneumonia due to Pseudomonas: Secondary | ICD-10-CM | POA: Diagnosis not present

## 2022-02-03 DIAGNOSIS — F419 Anxiety disorder, unspecified: Secondary | ICD-10-CM | POA: Diagnosis not present

## 2022-02-03 DIAGNOSIS — A419 Sepsis, unspecified organism: Secondary | ICD-10-CM | POA: Diagnosis not present

## 2022-02-03 DIAGNOSIS — E119 Type 2 diabetes mellitus without complications: Secondary | ICD-10-CM | POA: Diagnosis not present

## 2022-02-03 DIAGNOSIS — G1221 Amyotrophic lateral sclerosis: Secondary | ICD-10-CM | POA: Diagnosis not present

## 2022-02-03 DIAGNOSIS — R6521 Severe sepsis with septic shock: Secondary | ICD-10-CM | POA: Diagnosis not present

## 2022-02-03 DIAGNOSIS — G909 Disorder of the autonomic nervous system, unspecified: Secondary | ICD-10-CM | POA: Diagnosis not present

## 2022-02-03 DIAGNOSIS — J9620 Acute and chronic respiratory failure, unspecified whether with hypoxia or hypercapnia: Secondary | ICD-10-CM | POA: Diagnosis not present

## 2022-02-03 LAB — MAGNESIUM: Magnesium: 1.9 mg/dL (ref 1.7–2.4)

## 2022-02-03 LAB — PHOSPHORUS: Phosphorus: 4 mg/dL (ref 2.5–4.6)

## 2022-02-03 LAB — CBC
HCT: 29 % — ABNORMAL LOW (ref 39.0–52.0)
Hemoglobin: 9.5 g/dL — ABNORMAL LOW (ref 13.0–17.0)
MCH: 29.6 pg (ref 26.0–34.0)
MCHC: 32.8 g/dL (ref 30.0–36.0)
MCV: 90.3 fL (ref 80.0–100.0)
Platelets: 203 10*3/uL (ref 150–400)
RBC: 3.21 MIL/uL — ABNORMAL LOW (ref 4.22–5.81)
RDW: 13.4 % (ref 11.5–15.5)
WBC: 7 10*3/uL (ref 4.0–10.5)
nRBC: 0 % (ref 0.0–0.2)

## 2022-02-03 LAB — COMPREHENSIVE METABOLIC PANEL
ALT: 34 U/L (ref 0–44)
AST: 17 U/L (ref 15–41)
Albumin: 3.1 g/dL — ABNORMAL LOW (ref 3.5–5.0)
Alkaline Phosphatase: 75 U/L (ref 38–126)
Anion gap: 9 (ref 5–15)
BUN: 10 mg/dL (ref 6–20)
CO2: 20 mmol/L — ABNORMAL LOW (ref 22–32)
Calcium: 9.5 mg/dL (ref 8.9–10.3)
Chloride: 110 mmol/L (ref 98–111)
Creatinine, Ser: <0.3 mg/dL — ABNORMAL LOW (ref 0.61–1.24)
Glucose, Bld: 97 mg/dL (ref 70–99)
Potassium: 3.3 mmol/L — ABNORMAL LOW (ref 3.5–5.1)
Sodium: 139 mmol/L (ref 135–145)
Total Bilirubin: 0.5 mg/dL (ref 0.3–1.2)
Total Protein: 6.6 g/dL (ref 6.5–8.1)

## 2022-02-03 LAB — TRIGLYCERIDES: Triglycerides: 119 mg/dL (ref <150)

## 2022-02-03 NOTE — Progress Notes (Cosign Needed)
Pulmonary Critical Care Medicine [place]   PULMONARY CRITICAL CARE SERVICE  PROGRESS NOTE     Adyn Serna  KKX:[id number]  DOB: 05-08-1971   DOA: 01/08/2022  Referring Physician: Satira Sark, MD  HPI: Thomos Domine is a 51 y.o. male being followed for ventilator/airway/oxygen weaning Acute on Chronic Respiratory Failure.  Patient seen lying in bed, currently remains on full support.  Did complete 16 hours pressure support yesterday, however did declined to be placed on ATC.  We will continue with ATC goal of 2 hours today.  No acute distress, no acute overnight events.  Medications: Reviewed on Rounds  Physical Exam:  Vitals: Temp 97.1, pulse 75, respirations 16, BP 110/73, SPO2 99%  Ventilator Settings AC VC, FiO2 40%, tidal volume 500, rate 16, PEEP 5  General: Comfortable at this time Neck: supple Cardiovascular: no malignant arrhythmias Respiratory: Bilaterally clear but diminished Skin: no rash seen on limited exam Musculoskeletal: No gross abnormality Psychiatric:unable to assess Neurologic:no involuntary movements         Lab Data:   Basic Metabolic Panel: Recent Labs  Lab 01/28/22 0219 01/31/22 1505 02/01/22 1002 02/02/22 0352 02/03/22 0317  NA 139 139  --  141 139  K 3.9 3.3* 3.1* 3.6 3.3*  CL 108 110  --  113* 110  CO2 25 22  --  18* 20*  GLUCOSE 103* 105*  --  80 97  BUN 19 18  --  9 10  CREATININE <0.30* <0.30*  --  <0.30* <0.30*  CALCIUM 9.5 9.6  --  9.6 9.5  MG 1.9 1.8  --  1.8 1.9  PHOS  --   --   --   --  4.0    ABG: No results for input(s): "PHART", "PCO2ART", "PO2ART", "HCO3", "O2SAT" in the last 168 hours.  Liver Function Tests: Recent Labs  Lab 02/03/22 0317  AST 17  ALT 34  ALKPHOS 75  BILITOT 0.5  PROT 6.6  ALBUMIN 3.1*   No results for input(s): "LIPASE", "AMYLASE" in the last 168 hours. No results for input(s): "AMMONIA" in the last 168 hours.  CBC: Recent Labs  Lab 01/28/22 0219  01/31/22 1505 02/03/22 0317  WBC 5.8 6.4 7.0  HGB 9.5* 10.0* 9.5*  HCT 29.4* 29.9* 29.0*  MCV 91.3 88.7 90.3  PLT 258 224 203    Cardiac Enzymes: No results for input(s): "CKTOTAL", "CKMB", "CKMBINDEX", "TROPONINI" in the last 168 hours.  BNP (last 3 results) No results for input(s): "BNP" in the last 8760 hours.  ProBNP (last 3 results) No results for input(s): "PROBNP" in the last 8760 hours.  Radiological Exams: DG Abd 1 View  Result Date: 02/02/2022 CLINICAL DATA:  Ileus EXAM: ABDOMEN - 1 VIEW COMPARISON:  Yesterday FINDINGS: Diffuse gaseous distension of bowel consistent with history of ileus. Progressive distension of the ascending colon with nearly 10 cm diameter at the hepatic flexure. Percutaneous gastrostomy tube. No concerning mass effect or gas collection. IMPRESSION: Continued ileus pattern with further distension of the proximal colon. Electronically Signed   By: Jorje Guild M.D.   On: 02/02/2022 06:29    Assessment/Plan Active Problems:   ALS (amyotrophic lateral sclerosis) (HCC)   Septic shock (HCC)   Pseudomonas pneumonia (HCC)   Autonomic dysfunction   Acute on chronic respiratory failure with hypoxia (HCC)  Acute on chronic respiratory failure hypoxia-remains on full support.  According to respiratory therapy family is anticipating patient be discharged vent dependent.  Continue weaning per protocol.  Continue  with supportive care. Severe sepsis and shock-has resolved.  Hemodynamics remained stable. Pseudomonas pneumonia -has been treated with antibiotics we will continue with supportive care- Autonomic dysfunction- no change overall.  Has been more stable. ALS -advanced severe disease end-stage.  May be a barrier for decannulation.   I have personally seen and evaluated the patient, evaluated laboratory and imaging results, formulated the assessment and plan and placed orders. The Patient requires high complexity decision making with multiple systems  involvement.  Rounds were done with the Respiratory Therapy Director and Staff therapists and discussed with nursing staff also.  Allyne Gee, MD [facility] Pulmonary Critical Care Medicine Sleep Medicine

## 2022-02-04 ENCOUNTER — Other Ambulatory Visit (HOSPITAL_COMMUNITY): Payer: Self-pay

## 2022-02-04 DIAGNOSIS — A419 Sepsis, unspecified organism: Secondary | ICD-10-CM | POA: Diagnosis not present

## 2022-02-04 DIAGNOSIS — J151 Pneumonia due to Pseudomonas: Secondary | ICD-10-CM | POA: Diagnosis not present

## 2022-02-04 DIAGNOSIS — R6521 Severe sepsis with septic shock: Secondary | ICD-10-CM | POA: Diagnosis not present

## 2022-02-04 DIAGNOSIS — K567 Ileus, unspecified: Secondary | ICD-10-CM | POA: Diagnosis not present

## 2022-02-04 DIAGNOSIS — G909 Disorder of the autonomic nervous system, unspecified: Secondary | ICD-10-CM | POA: Diagnosis not present

## 2022-02-04 DIAGNOSIS — E119 Type 2 diabetes mellitus without complications: Secondary | ICD-10-CM | POA: Diagnosis not present

## 2022-02-04 DIAGNOSIS — K6389 Other specified diseases of intestine: Secondary | ICD-10-CM | POA: Diagnosis not present

## 2022-02-04 DIAGNOSIS — J9620 Acute and chronic respiratory failure, unspecified whether with hypoxia or hypercapnia: Secondary | ICD-10-CM | POA: Diagnosis not present

## 2022-02-04 DIAGNOSIS — G1221 Amyotrophic lateral sclerosis: Secondary | ICD-10-CM | POA: Diagnosis not present

## 2022-02-04 DIAGNOSIS — J9621 Acute and chronic respiratory failure with hypoxia: Secondary | ICD-10-CM | POA: Diagnosis not present

## 2022-02-04 DIAGNOSIS — R1310 Dysphagia, unspecified: Secondary | ICD-10-CM | POA: Diagnosis not present

## 2022-02-04 LAB — BASIC METABOLIC PANEL
Anion gap: 7 (ref 5–15)
BUN: 10 mg/dL (ref 6–20)
CO2: 20 mmol/L — ABNORMAL LOW (ref 22–32)
Calcium: 9.1 mg/dL (ref 8.9–10.3)
Chloride: 109 mmol/L (ref 98–111)
Creatinine, Ser: <0.3 mg/dL — ABNORMAL LOW (ref 0.61–1.24)
Glucose, Bld: 109 mg/dL — ABNORMAL HIGH (ref 70–99)
Potassium: 3.8 mmol/L (ref 3.5–5.1)
Sodium: 136 mmol/L (ref 135–145)

## 2022-02-04 LAB — PHOSPHORUS: Phosphorus: 3.5 mg/dL (ref 2.5–4.6)

## 2022-02-04 LAB — MAGNESIUM: Magnesium: 1.7 mg/dL (ref 1.7–2.4)

## 2022-02-04 NOTE — Progress Notes (Signed)
Pulmonary Critical Care Medicine [place]   PULMONARY CRITICAL CARE SERVICE  PROGRESS NOTE     Jerry [name]  [id number]:[id number]  DOB: Jul 18, 1970   DOA: 01/08/2022  Referring Physician: Satira Sark, MD  HPI: Jerry [name] is a 51 y.o. male being followed for ventilator/airway/oxygen weaning Acute on Chronic Respiratory Failure.  Patient was refusing T collar still did about 16 hours of pressure support yesterday however so we will try pressure support once again  Medications: Reviewed on Rounds  Physical Exam:  Vitals: Temperature is 98.2 pulse 86 respiratory is 20 blood pressure is 108/66  Ventilator Settings on pressure support FiO2 30% pressure 12/5  General: Comfortable at this time Neck: supple Cardiovascular: no malignant arrhythmias Respiratory: No rhonchi very coarse breath sounds Skin: no rash seen on limited exam Musculoskeletal: No gross abnormality Psychiatric:unable to assess Neurologic:no involuntary movements         Lab Data:   Basic Metabolic Panel: Recent Labs  Lab 01/31/22 1505 02/01/22 1002 02/02/22 0352 02/03/22 0317 02/04/22 0417  NA 139  --  141 139 136  K 3.3* 3.1* 3.6 3.3* 3.8  CL 110  --  113* 110 109  CO2 22  --  18* 20* 20*  GLUCOSE 105*  --  80 97 109*  BUN 18  --  '9 10 10  '$ CREATININE <0.30*  --  <0.30* <0.30* <0.30*  CALCIUM 9.6  --  9.6 9.5 9.1  MG 1.8  --  1.8 1.9 1.7  PHOS  --   --   --  4.0 3.5    ABG: No results for input(s): "PHART", "PCO2ART", "PO2ART", "HCO3", "O2SAT" in the last 168 hours.  Liver Function Tests: Recent Labs  Lab 02/03/22 0317  AST 17  ALT 34  ALKPHOS 75  BILITOT 0.5  PROT 6.6  ALBUMIN 3.1*   No results for input(s): "LIPASE", "AMYLASE" in the last 168 hours. No results for input(s): "AMMONIA" in the last 168 hours.  CBC: Recent Labs  Lab 01/31/22 1505 02/03/22 0317  WBC 6.4 7.0  HGB 10.0* 9.5*  HCT 29.9* 29.0*  MCV 88.7 90.3  PLT 224 203    Cardiac  Enzymes: No results for input(s): "CKTOTAL", "CKMB", "CKMBINDEX", "TROPONINI" in the last 168 hours.  BNP (last 3 results) No results for input(s): "BNP" in the last 8760 hours.  ProBNP (last 3 results) No results for input(s): "PROBNP" in the last 8760 hours.  Radiological Exams: DG Abd 1 View  Result Date: 02/04/2022 CLINICAL DATA:  Follow-up ileus. EXAM: ABDOMEN - 1 VIEW COMPARISON:  02/02/2022 FINDINGS: There is a gastrostomy tube identified in the left upper quadrant of the abdomen. Diffuse gaseous distension of the small and large bowel again noted. Decrease distension of the ascending colon which measures 5.1 cm at the level of the hepatic flexure compared with 9.7 cm previously. IMPRESSION: 1. Persistent ileus pattern. 2. Decreased gaseous distension of the ascending colon at the level of the a Paddock flexure. Electronically Signed   By: Kerby Moors M.D.   On: 02/04/2022 06:16    Assessment/Plan Active Problems:   ALS (amyotrophic lateral sclerosis) (HCC)   Septic shock (HCC)   Pseudomonas pneumonia (HCC)   Autonomic dysfunction   Acute on chronic respiratory failure with hypoxia (HCC)   Acute on chronic respiratory failure hypoxia we will continue with trying to wean on pressure support and again try with T collar but as has been previously noted I suspect patient has baseline vent dependent ALS  advanced severe disease prognosis guarded Sepsis with shock resolved hemodynamics are stable Pseudomonas pneumonia has been treated with antibiotics Autonomic dysfunction no change   I have personally seen and evaluated the patient, evaluated laboratory and imaging results, formulated the assessment and plan and placed orders. The Patient requires high complexity decision making with multiple systems involvement.  Rounds were done with the Respiratory Therapy Director and Staff therapists and discussed with nursing staff also.  Allyne Gee, MD [facility] Pulmonary Critical Care  Medicine Sleep Medicine

## 2022-02-05 ENCOUNTER — Ambulatory Visit: Payer: Self-pay

## 2022-02-05 ENCOUNTER — Other Ambulatory Visit (HOSPITAL_COMMUNITY): Payer: Self-pay

## 2022-02-05 DIAGNOSIS — K6389 Other specified diseases of intestine: Secondary | ICD-10-CM | POA: Diagnosis not present

## 2022-02-05 DIAGNOSIS — J9621 Acute and chronic respiratory failure with hypoxia: Secondary | ICD-10-CM | POA: Diagnosis not present

## 2022-02-05 DIAGNOSIS — G909 Disorder of the autonomic nervous system, unspecified: Secondary | ICD-10-CM | POA: Diagnosis not present

## 2022-02-05 DIAGNOSIS — R509 Fever, unspecified: Secondary | ICD-10-CM | POA: Diagnosis not present

## 2022-02-05 DIAGNOSIS — R059 Cough, unspecified: Secondary | ICD-10-CM | POA: Diagnosis not present

## 2022-02-05 DIAGNOSIS — G1221 Amyotrophic lateral sclerosis: Secondary | ICD-10-CM | POA: Diagnosis not present

## 2022-02-05 DIAGNOSIS — A419 Sepsis, unspecified organism: Secondary | ICD-10-CM | POA: Diagnosis not present

## 2022-02-05 DIAGNOSIS — J151 Pneumonia due to Pseudomonas: Secondary | ICD-10-CM | POA: Diagnosis not present

## 2022-02-05 DIAGNOSIS — K567 Ileus, unspecified: Secondary | ICD-10-CM | POA: Diagnosis not present

## 2022-02-05 DIAGNOSIS — R6521 Severe sepsis with septic shock: Secondary | ICD-10-CM | POA: Diagnosis not present

## 2022-02-05 LAB — MAGNESIUM: Magnesium: 1.9 mg/dL (ref 1.7–2.4)

## 2022-02-05 NOTE — Progress Notes (Signed)
Pulmonary Critical Care Medicine [place]   PULMONARY CRITICAL CARE SERVICE  PROGRESS NOTE     Jerry [name]  [id number]  DOB: 1971/06/27   DOA: 01/08/2022  Referring Physician: Satira Sark, MD  HPI: Jerry [name] is a 51 y.o. male being followed for ventilator/airway/oxygen weaning Acute on Chronic Respiratory Failure.  Patient is on pressure support mode has been refusing T collar currently is on 12/5 pressure support  Medications: Reviewed on Rounds  Physical Exam:  Vitals: Temperature is 97.0 pulse of 79 respiratory rate 20 blood pressure is 119/68  Ventilator Settings pressure support FiO2 30% pressure 12/5  General: Comfortable at this time Neck: supple Cardiovascular: no malignant arrhythmias Respiratory: Scattered rhonchi expansion is equal Skin: no rash seen on limited exam Musculoskeletal: No gross abnormality Psychiatric:unable to assess Neurologic:no involuntary movements         Lab Data:   Basic Metabolic Panel: Recent Labs  Lab 01/31/22 1505 02/01/22 1002 02/02/22 0352 02/03/22 0317 02/04/22 0417 02/05/22 0602  NA 139  --  141 139 136  --   K 3.3* 3.1* 3.6 3.3* 3.8  --   CL 110  --  113* 110 109  --   CO2 22  --  18* 20* 20*  --   GLUCOSE 105*  --  80 97 109*  --   BUN 18  --  '9 10 10  '$ --   CREATININE <0.30*  --  <0.30* <0.30* <0.30*  --   CALCIUM 9.6  --  9.6 9.5 9.1  --   MG 1.8  --  1.8 1.9 1.7 1.9  PHOS  --   --   --  4.0 3.5  --     ABG: No results for input(s): "PHART", "PCO2ART", "PO2ART", "HCO3", "O2SAT" in the last 168 hours.  Liver Function Tests: Recent Labs  Lab 02/03/22 0317  AST 17  ALT 34  ALKPHOS 75  BILITOT 0.5  PROT 6.6  ALBUMIN 3.1*   No results for input(s): "LIPASE", "AMYLASE" in the last 168 hours. No results for input(s): "AMMONIA" in the last 168 hours.  CBC: Recent Labs  Lab 01/31/22 1505 02/03/22 0317  WBC 6.4 7.0  HGB 10.0* 9.5*  HCT 29.9* 29.0*  MCV 88.7 90.3  PLT  224 203    Cardiac Enzymes: No results for input(s): "CKTOTAL", "CKMB", "CKMBINDEX", "TROPONINI" in the last 168 hours.  BNP (last 3 results) No results for input(s): "BNP" in the last 8760 hours.  ProBNP (last 3 results) No results for input(s): "PROBNP" in the last 8760 hours.  Radiological Exams: DG Abd Portable 1V  Result Date: 02/05/2022 CLINICAL DATA:  Follow-up ileus EXAM: PORTABLE ABDOMEN - 1 VIEW COMPARISON:  Portable exam 0804 hours compared to 02/04/2022 FINDINGS: Persistent air-filled large and small bowel loops throughout abdomen. No bowel dilatation or bowel wall thickening. Findings remain most consistent with ileus. Osseous structures unremarkable. G-tube LEFT upper quadrant. IMPRESSION: Persistent ileus. Electronically Signed   By: Lavonia Dana M.D.   On: 02/05/2022 08:17   DG Abd 1 View  Result Date: 02/04/2022 CLINICAL DATA:  Follow-up ileus. EXAM: ABDOMEN - 1 VIEW COMPARISON:  02/02/2022 FINDINGS: There is a gastrostomy tube identified in the left upper quadrant of the abdomen. Diffuse gaseous distension of the small and large bowel again noted. Decrease distension of the ascending colon which measures 5.1 cm at the level of the hepatic flexure compared with 9.7 cm previously. IMPRESSION: 1. Persistent ileus pattern. 2. Decreased gaseous distension of  the ascending colon at the level of the a Paddock flexure. Electronically Signed   By: Kerby Moors M.D.   On: 02/04/2022 06:16    Assessment/Plan Active Problems:   ALS (amyotrophic lateral sclerosis) (HCC)   Septic shock (HCC)   Pseudomonas pneumonia (HCC)   Autonomic dysfunction   Acute on chronic respiratory failure with hypoxia (HCC)   Acute on chronic respiratory failure with hypoxia continue with the pressure support mode titrate oxygen as tolerated continue pulmonary toilet and supportive care Sepsis and shock supportive care we will continue to follow along Pseudomonas pneumonia has been treated Autonomic  dysfunction patient is at baseline ALS severe end-stage advanced disease   I have personally seen and evaluated the patient, evaluated laboratory and imaging results, formulated the assessment and plan and placed orders. The Patient requires high complexity decision making with multiple systems involvement.  Rounds were done with the Respiratory Therapy Director and Staff therapists and discussed with nursing staff also.  Allyne Gee, MD [facility] Pulmonary Critical Care Medicine Sleep Medicine

## 2022-02-06 DIAGNOSIS — G1221 Amyotrophic lateral sclerosis: Secondary | ICD-10-CM | POA: Diagnosis not present

## 2022-02-06 DIAGNOSIS — J151 Pneumonia due to Pseudomonas: Secondary | ICD-10-CM | POA: Diagnosis not present

## 2022-02-06 DIAGNOSIS — E119 Type 2 diabetes mellitus without complications: Secondary | ICD-10-CM | POA: Diagnosis not present

## 2022-02-06 DIAGNOSIS — J9690 Respiratory failure, unspecified, unspecified whether with hypoxia or hypercapnia: Secondary | ICD-10-CM | POA: Diagnosis not present

## 2022-02-06 LAB — COMPREHENSIVE METABOLIC PANEL
ALT: 22 U/L (ref 0–44)
AST: 15 U/L (ref 15–41)
Albumin: 3.1 g/dL — ABNORMAL LOW (ref 3.5–5.0)
Alkaline Phosphatase: 75 U/L (ref 38–126)
Anion gap: 7 (ref 5–15)
BUN: 13 mg/dL (ref 6–20)
CO2: 24 mmol/L (ref 22–32)
Calcium: 9.5 mg/dL (ref 8.9–10.3)
Chloride: 106 mmol/L (ref 98–111)
Creatinine, Ser: <0.3 mg/dL — ABNORMAL LOW (ref 0.61–1.24)
Glucose, Bld: 143 mg/dL — ABNORMAL HIGH (ref 70–99)
Potassium: 4.1 mmol/L (ref 3.5–5.1)
Sodium: 137 mmol/L (ref 135–145)
Total Bilirubin: 0.4 mg/dL (ref 0.3–1.2)
Total Protein: 6.7 g/dL (ref 6.5–8.1)

## 2022-02-06 LAB — CBC
HCT: 31.5 % — ABNORMAL LOW (ref 39.0–52.0)
Hemoglobin: 10.4 g/dL — ABNORMAL LOW (ref 13.0–17.0)
MCH: 29.6 pg (ref 26.0–34.0)
MCHC: 33 g/dL (ref 30.0–36.0)
MCV: 89.7 fL (ref 80.0–100.0)
Platelets: 207 10*3/uL (ref 150–400)
RBC: 3.51 MIL/uL — ABNORMAL LOW (ref 4.22–5.81)
RDW: 13.3 % (ref 11.5–15.5)
WBC: 8.8 10*3/uL (ref 4.0–10.5)
nRBC: 0 % (ref 0.0–0.2)

## 2022-02-06 LAB — PHOSPHORUS: Phosphorus: 3.2 mg/dL (ref 2.5–4.6)

## 2022-02-06 LAB — MAGNESIUM: Magnesium: 1.8 mg/dL (ref 1.7–2.4)

## 2022-02-07 ENCOUNTER — Other Ambulatory Visit (HOSPITAL_COMMUNITY): Payer: Self-pay

## 2022-02-07 DIAGNOSIS — J9621 Acute and chronic respiratory failure with hypoxia: Secondary | ICD-10-CM | POA: Diagnosis not present

## 2022-02-07 DIAGNOSIS — G1221 Amyotrophic lateral sclerosis: Secondary | ICD-10-CM | POA: Diagnosis not present

## 2022-02-07 DIAGNOSIS — R6521 Severe sepsis with septic shock: Secondary | ICD-10-CM | POA: Diagnosis not present

## 2022-02-07 DIAGNOSIS — K567 Ileus, unspecified: Secondary | ICD-10-CM | POA: Diagnosis not present

## 2022-02-07 DIAGNOSIS — G909 Disorder of the autonomic nervous system, unspecified: Secondary | ICD-10-CM | POA: Diagnosis not present

## 2022-02-07 DIAGNOSIS — A419 Sepsis, unspecified organism: Secondary | ICD-10-CM | POA: Diagnosis not present

## 2022-02-07 DIAGNOSIS — J151 Pneumonia due to Pseudomonas: Secondary | ICD-10-CM | POA: Diagnosis not present

## 2022-02-07 DIAGNOSIS — K6389 Other specified diseases of intestine: Secondary | ICD-10-CM | POA: Diagnosis not present

## 2022-02-07 LAB — COMPREHENSIVE METABOLIC PANEL
ALT: 20 U/L (ref 0–44)
AST: 15 U/L (ref 15–41)
Albumin: 3 g/dL — ABNORMAL LOW (ref 3.5–5.0)
Alkaline Phosphatase: 67 U/L (ref 38–126)
Anion gap: 10 (ref 5–15)
BUN: 16 mg/dL (ref 6–20)
CO2: 22 mmol/L (ref 22–32)
Calcium: 9.3 mg/dL (ref 8.9–10.3)
Chloride: 102 mmol/L (ref 98–111)
Creatinine, Ser: <0.3 mg/dL — ABNORMAL LOW (ref 0.61–1.24)
Glucose, Bld: 137 mg/dL — ABNORMAL HIGH (ref 70–99)
Potassium: 3.7 mmol/L (ref 3.5–5.1)
Sodium: 134 mmol/L — ABNORMAL LOW (ref 135–145)
Total Bilirubin: 0.4 mg/dL (ref 0.3–1.2)
Total Protein: 6.2 g/dL — ABNORMAL LOW (ref 6.5–8.1)

## 2022-02-07 LAB — CBC
HCT: 29.7 % — ABNORMAL LOW (ref 39.0–52.0)
Hemoglobin: 9.7 g/dL — ABNORMAL LOW (ref 13.0–17.0)
MCH: 29.8 pg (ref 26.0–34.0)
MCHC: 32.7 g/dL (ref 30.0–36.0)
MCV: 91.4 fL (ref 80.0–100.0)
Platelets: 200 10*3/uL (ref 150–400)
RBC: 3.25 MIL/uL — ABNORMAL LOW (ref 4.22–5.81)
RDW: 13.5 % (ref 11.5–15.5)
WBC: 8.2 10*3/uL (ref 4.0–10.5)
nRBC: 0 % (ref 0.0–0.2)

## 2022-02-07 NOTE — Progress Notes (Signed)
Pulmonary Critical Care Medicine [place]   PULMONARY CRITICAL CARE SERVICE  PROGRESS NOTE     [name]  WCH:[id number]  DOB: June 06, 1971   DOA: 01/08/2022  Referring Physician: Satira Sark, MD  HPI: Jerry [name] is a 51 y.o. male being followed for ventilator/airway/oxygen weaning Acute on Chronic Respiratory Failure.  Patient is on pressure support mode currently goal of 16 hours  Medications: Reviewed on Rounds  Physical Exam:  Vitals: Temperature is 98.4 pulse 101 respiratory 17 blood pressure 107/62 saturations 100%  Ventilator Settings on pressure support  General: Comfortable at this time Neck: supple Cardiovascular: no malignant arrhythmias Respiratory: Scattered rhonchi expansion equal Skin: no rash seen on limited exam Musculoskeletal: No gross abnormality Psychiatric:unable to assess Neurologic:no involuntary movements         Lab Data:   Basic Metabolic Panel: Recent Labs  Lab 02/02/22 0352 02/03/22 0317 02/04/22 0417 02/05/22 0602 02/06/22 0359 02/07/22 0729  NA 141 139 136  --  137 134*  K 3.6 3.3* 3.8  --  4.1 3.7  CL 113* 110 109  --  106 102  CO2 18* 20* 20*  --  24 22  GLUCOSE 80 97 109*  --  143* 137*  BUN '9 10 10  '$ --  13 16  CREATININE <0.30* <0.30* <0.30*  --  <0.30* <0.30*  CALCIUM 9.6 9.5 9.1  --  9.5 9.3  MG 1.8 1.9 1.7 1.9 1.8  --   PHOS  --  4.0 3.5  --  3.2  --     ABG: No results for input(s): "PHART", "PCO2ART", "PO2ART", "HCO3", "O2SAT" in the last 168 hours.  Liver Function Tests: Recent Labs  Lab 02/03/22 0317 02/06/22 0359 02/07/22 0729  AST '17 15 15  '$ ALT 34 22 20  ALKPHOS 75 75 67  BILITOT 0.5 0.4 0.4  PROT 6.6 6.7 6.2*  ALBUMIN 3.1* 3.1* 3.0*   No results for input(s): "LIPASE", "AMYLASE" in the last 168 hours. No results for input(s): "AMMONIA" in the last 168 hours.  CBC: Recent Labs  Lab 01/31/22 1505 02/03/22 0317 02/06/22 0359 02/07/22 0729  WBC 6.4 7.0 8.8 8.2  HGB  10.0* 9.5* 10.4* 9.7*  HCT 29.9* 29.0* 31.5* 29.7*  MCV 88.7 90.3 89.7 91.4  PLT 224 203 207 200    Cardiac Enzymes: No results for input(s): "CKTOTAL", "CKMB", "CKMBINDEX", "TROPONINI" in the last 168 hours.  BNP (last 3 results) No results for input(s): "BNP" in the last 8760 hours.  ProBNP (last 3 results) No results for input(s): "PROBNP" in the last 8760 hours.  Radiological Exams: DG Abd 1 View  Result Date: 02/07/2022 CLINICAL DATA:  Evaluate ileus. EXAM: ABDOMEN - 1 VIEW COMPARISON:  02/04/2022 FINDINGS: There is a gastrostomy tube within the left upper quadrant of the abdomen. Unchanged appearance of diffuse gaseous distension of the large and small bowel loops. The degree of bowel distention appears stable from the previous exam. IMPRESSION: Persistent ileus pattern. No significant change in bowel distension compared with previous exam. Electronically Signed   By: Kerby Moors M.D.   On: 02/07/2022 09:06   DG CHEST PORT 1 VIEW  Addendum Date: 02/06/2022   ADDENDUM REPORT: 02/06/2022 07:55 ADDENDUM: A right arm PICC line is identified with tip projecting over the expected location of the cavoatrial junction. Electronically Signed   By: Kerby Moors M.D.   On: 02/06/2022 07:55   Result Date: 02/06/2022 CLINICAL DATA:  Cough, fever EXAM: PORTABLE CHEST 1 VIEW COMPARISON:  01/18/2022 FINDINGS: There is interval worsening of infiltrate in left lower lung field. Left hemidiaphragm is elevated. Rest of the lung fields are clear. There are no signs of pulmonary edema. Lateral CP angles are clear. Tip of tracheostomy tube is 3.4 cm above the carina. Tip of right chest port is seen in the region of right atrium. IMPRESSION: There is interval worsening of infiltrate in left lower lung fields suggesting worsening atelectasis/pneumonia. Electronically Signed: By: Elmer Picker M.D. On: 02/05/2022 17:43    Assessment/Plan Active Problems:   ALS (amyotrophic lateral sclerosis) (HCC)    Septic shock (HCC)   Pseudomonas pneumonia (HCC)   Autonomic dysfunction   Acute on chronic respiratory failure with hypoxia (HCC)   Acute on chronic respiratory failure hypoxia try for 16-hour goal on pressure support if patient is able to tolerate Sepsis resolved hemodynamics are stable Pseudomonas pneumonia has been treated with antibiotics some worsening of x-rays noted that will suggest cultures ALS severe disease Autonomic dysfunction has been stable   I have personally seen and evaluated the patient, evaluated laboratory and imaging results, formulated the assessment and plan and placed orders. The Patient requires high complexity decision making with multiple systems involvement.  Rounds were done with the Respiratory Therapy Director and Staff therapists and discussed with nursing staff also.  Allyne Gee, MD [facility] Pulmonary Critical Care Medicine Sleep Medicine

## 2022-02-08 ENCOUNTER — Ambulatory Visit: Payer: Self-pay

## 2022-02-08 DIAGNOSIS — E119 Type 2 diabetes mellitus without complications: Secondary | ICD-10-CM | POA: Diagnosis not present

## 2022-02-08 DIAGNOSIS — G909 Disorder of the autonomic nervous system, unspecified: Secondary | ICD-10-CM | POA: Diagnosis not present

## 2022-02-08 DIAGNOSIS — J151 Pneumonia due to Pseudomonas: Secondary | ICD-10-CM | POA: Diagnosis not present

## 2022-02-08 DIAGNOSIS — L89151 Pressure ulcer of sacral region, stage 1: Secondary | ICD-10-CM | POA: Diagnosis not present

## 2022-02-08 DIAGNOSIS — J9620 Acute and chronic respiratory failure, unspecified whether with hypoxia or hypercapnia: Secondary | ICD-10-CM | POA: Diagnosis not present

## 2022-02-08 DIAGNOSIS — F419 Anxiety disorder, unspecified: Secondary | ICD-10-CM | POA: Diagnosis not present

## 2022-02-08 DIAGNOSIS — A419 Sepsis, unspecified organism: Secondary | ICD-10-CM | POA: Diagnosis not present

## 2022-02-08 DIAGNOSIS — G1221 Amyotrophic lateral sclerosis: Secondary | ICD-10-CM | POA: Diagnosis not present

## 2022-02-08 DIAGNOSIS — J9621 Acute and chronic respiratory failure with hypoxia: Secondary | ICD-10-CM | POA: Diagnosis not present

## 2022-02-08 DIAGNOSIS — R6521 Severe sepsis with septic shock: Secondary | ICD-10-CM | POA: Diagnosis not present

## 2022-02-08 LAB — MAGNESIUM: Magnesium: 1.7 mg/dL (ref 1.7–2.4)

## 2022-02-08 NOTE — Patient Outreach (Signed)
  Care Coordination   Follow Up Visit Note   02/08/2022 Name: Jerry [name] MRN: [id number] DOB: 1970/10/14  Jerry [name] is a 51 y.o. year old male who sees [name], MD for primary care. I  collaborated with [facility] regarding Jerry [name]'s anticipated in home needs.  What matters to the patients health and wellness today?  In Home Services    Goals Addressed             This Visit's Progress    Assist with arranging in-home services       Care Coordination Interventions: Discussed concerns related to assistance in the home. Patient is currently completing rehab at [facility]. Discharge to be determined. Spouse reports additional nursing assistance will be required once the patient discharges. Patient was receiving PCS services via [facility] prior to hospitalization.  Patient was previously approved for 80 hrs /month. His acuity has increased d/t tracheostomy. Spouse is requesting assistance with increasing PCS hours and arranging overnight assistance once patient discharges home. Contacted [facility] to discuss options for overnight assistance. Pending return call. Update        SDOH assessments and interventions completed:  Yes     Care Coordination Interventions Activated:  Yes  Care Coordination Interventions:  Yes, provided   Follow up plan:  Will continue to follow up as needed    Encounter Outcome:  Patient Visit Completed    Katha Cabal [facility] Health/Care Management [phone]

## 2022-02-08 NOTE — Progress Notes (Signed)
Pulmonary Critical Care Medicine [place]   PULMONARY CRITICAL CARE SERVICE  PROGRESS NOTE     Jerry [name]  LFY:[id number]  DOB: [birth year]   DOA: 01/08/2022  Referring Physician: Satira Sark, MD  HPI: Jerry [name] is a 51 y.o. male being followed for ventilator/airway/oxygen weaning Acute on Chronic Respiratory Failure.  Patient has been refusing the T collar weans remains on pressure support currently pressure 12/5  Medications: Reviewed on Rounds  Physical Exam:  Vitals: Temperature is 91.7 pulse 89 respiratory 22 blood pressure 107/51 saturation is 97%  Ventilator Settings pressure support FiO2 is 30% pressure 12/5  General: Comfortable at this time Neck: supple Cardiovascular: no malignant arrhythmias Respiratory: Scattered rhonchi expansion is equal Skin: no rash seen on limited exam Musculoskeletal: No gross abnormality Psychiatric:unable to assess Neurologic:no involuntary movements         Lab Data:   Basic Metabolic Panel: Recent Labs  Lab 02/02/22 0352 02/03/22 0317 02/04/22 0417 02/05/22 0602 02/06/22 0359 02/07/22 0729 02/08/22 0722  NA 141 139 136  --  137 134*  --   K 3.6 3.3* 3.8  --  4.1 3.7  --   CL 113* 110 109  --  106 102  --   CO2 18* 20* 20*  --  24 22  --   GLUCOSE 80 97 109*  --  143* 137*  --   BUN '9 10 10  '$ --  13 16  --   CREATININE <0.30* <0.30* <0.30*  --  <0.30* <0.30*  --   CALCIUM 9.6 9.5 9.1  --  9.5 9.3  --   MG 1.8 1.9 1.7 1.9 1.8  --  1.7  PHOS  --  4.0 3.5  --  3.2  --   --     ABG: No results for input(s): "PHART", "PCO2ART", "PO2ART", "HCO3", "O2SAT" in the last 168 hours.  Liver Function Tests: Recent Labs  Lab 02/03/22 0317 02/06/22 0359 02/07/22 0729  AST '17 15 15  '$ ALT 34 22 20  ALKPHOS 75 75 67  BILITOT 0.5 0.4 0.4  PROT 6.6 6.7 6.2*  ALBUMIN 3.1* 3.1* 3.0*   No results for input(s): "LIPASE", "AMYLASE" in the last 168 hours. No results for input(s): "AMMONIA" in the last  168 hours.  CBC: Recent Labs  Lab 02/03/22 0317 02/06/22 0359 02/07/22 0729  WBC 7.0 8.8 8.2  HGB 9.5* 10.4* 9.7*  HCT 29.0* 31.5* 29.7*  MCV 90.3 89.7 91.4  PLT 203 207 200    Cardiac Enzymes: No results for input(s): "CKTOTAL", "CKMB", "CKMBINDEX", "TROPONINI" in the last 168 hours.  BNP (last 3 results) No results for input(s): "BNP" in the last 8760 hours.  ProBNP (last 3 results) No results for input(s): "PROBNP" in the last 8760 hours.  Radiological Exams: DG Abd 1 View  Result Date: 02/07/2022 CLINICAL DATA:  Evaluate ileus. EXAM: ABDOMEN - 1 VIEW COMPARISON:  02/04/2022 FINDINGS: There is a gastrostomy tube within the left upper quadrant of the abdomen. Unchanged appearance of diffuse gaseous distension of the large and small bowel loops. The degree of bowel distention appears stable from the previous exam. IMPRESSION: Persistent ileus pattern. No significant change in bowel distension compared with previous exam. Electronically Signed   By: Kerby Moors M.D.   On: 02/07/2022 09:06    Assessment/Plan Active Problems:   ALS (amyotrophic lateral sclerosis) (HCC)   Septic shock (HCC)   Pseudomonas pneumonia (HCC)   Autonomic dysfunction   Acute on chronic respiratory  failure with hypoxia (HCC)   Acute on chronic respiratory failure hypoxia plan is going to continue with the pressure support titrate oxygen as tolerated continue secretion management pulmonary toilet Sepsis with shock supportive care Pseudomonas pneumonia at baseline treated Autonomic dysfunction stable ALS severe advanced disease end-stage   I have personally seen and evaluated the patient, evaluated laboratory and imaging results, formulated the assessment and plan and placed orders. The Patient requires high complexity decision making with multiple systems involvement.  Rounds were done with the Respiratory Therapy Director and Staff therapists and discussed with nursing staff also.  Allyne Gee, MD [facility] Pulmonary Critical Care Medicine Sleep Medicine

## 2022-02-09 ENCOUNTER — Other Ambulatory Visit (HOSPITAL_COMMUNITY): Payer: Self-pay

## 2022-02-09 DIAGNOSIS — J151 Pneumonia due to Pseudomonas: Secondary | ICD-10-CM | POA: Diagnosis not present

## 2022-02-09 DIAGNOSIS — G1221 Amyotrophic lateral sclerosis: Secondary | ICD-10-CM | POA: Diagnosis not present

## 2022-02-09 DIAGNOSIS — E119 Type 2 diabetes mellitus without complications: Secondary | ICD-10-CM | POA: Diagnosis not present

## 2022-02-09 DIAGNOSIS — J90 Pleural effusion, not elsewhere classified: Secondary | ICD-10-CM | POA: Diagnosis not present

## 2022-02-09 DIAGNOSIS — J9620 Acute and chronic respiratory failure, unspecified whether with hypoxia or hypercapnia: Secondary | ICD-10-CM | POA: Diagnosis not present

## 2022-02-09 DIAGNOSIS — J9690 Respiratory failure, unspecified, unspecified whether with hypoxia or hypercapnia: Secondary | ICD-10-CM | POA: Diagnosis not present

## 2022-02-09 DIAGNOSIS — K6389 Other specified diseases of intestine: Secondary | ICD-10-CM | POA: Diagnosis not present

## 2022-02-09 DIAGNOSIS — L89151 Pressure ulcer of sacral region, stage 1: Secondary | ICD-10-CM | POA: Diagnosis not present

## 2022-02-09 DIAGNOSIS — G909 Disorder of the autonomic nervous system, unspecified: Secondary | ICD-10-CM | POA: Diagnosis not present

## 2022-02-09 DIAGNOSIS — R6521 Severe sepsis with septic shock: Secondary | ICD-10-CM | POA: Diagnosis not present

## 2022-02-09 DIAGNOSIS — F419 Anxiety disorder, unspecified: Secondary | ICD-10-CM | POA: Diagnosis not present

## 2022-02-09 DIAGNOSIS — A419 Sepsis, unspecified organism: Secondary | ICD-10-CM | POA: Diagnosis not present

## 2022-02-09 DIAGNOSIS — J9621 Acute and chronic respiratory failure with hypoxia: Secondary | ICD-10-CM | POA: Diagnosis not present

## 2022-02-09 LAB — CBC
HCT: 28.1 % — ABNORMAL LOW (ref 39.0–52.0)
Hemoglobin: 8.9 g/dL — ABNORMAL LOW (ref 13.0–17.0)
MCH: 29 pg (ref 26.0–34.0)
MCHC: 31.7 g/dL (ref 30.0–36.0)
MCV: 91.5 fL (ref 80.0–100.0)
Platelets: 231 10*3/uL (ref 150–400)
RBC: 3.07 MIL/uL — ABNORMAL LOW (ref 4.22–5.81)
RDW: 13.6 % (ref 11.5–15.5)
WBC: 7.5 10*3/uL (ref 4.0–10.5)
nRBC: 0 % (ref 0.0–0.2)

## 2022-02-09 LAB — BASIC METABOLIC PANEL
Anion gap: 7 (ref 5–15)
BUN: 19 mg/dL (ref 6–20)
CO2: 27 mmol/L (ref 22–32)
Calcium: 9.3 mg/dL (ref 8.9–10.3)
Chloride: 103 mmol/L (ref 98–111)
Creatinine, Ser: <0.3 mg/dL — ABNORMAL LOW (ref 0.61–1.24)
Glucose, Bld: 106 mg/dL — ABNORMAL HIGH (ref 70–99)
Potassium: 3.9 mmol/L (ref 3.5–5.1)
Sodium: 137 mmol/L (ref 135–145)

## 2022-02-09 LAB — PHOSPHORUS: Phosphorus: 3.7 mg/dL (ref 2.5–4.6)

## 2022-02-09 LAB — MAGNESIUM: Magnesium: 1.9 mg/dL (ref 1.7–2.4)

## 2022-02-09 NOTE — Progress Notes (Signed)
Pulmonary Critical Care Medicine [place]   PULMONARY CRITICAL CARE SERVICE  PROGRESS NOTE     Jerry [name]  WYO:[id number]  DOB: 10/02/1970   DOA: 01/08/2022  Referring Physician: Satira Sark, MD  HPI: Jerry [name] is a 51 y.o. male being followed for ventilator/airway/oxygen weaning Acute on Chronic Respiratory Failure.  Patient is on pressure support has been on 30% FiO2 with a pressure of 12/5 saturations are good  Medications: Reviewed on Rounds  Physical Exam:  Vitals: Temperature is 96.4 pulse of 90 respiratory 22 blood pressure 128/68 saturations 100%  Ventilator Settings pressure support FiO2 is 30% pressure 12/5  General: Comfortable at this time Neck: supple Cardiovascular: no malignant arrhythmias Respiratory: Scattered rhonchi expansion is equal Skin: no rash seen on limited exam Musculoskeletal: No gross abnormality Psychiatric:unable to assess Neurologic:no involuntary movements         Lab Data:   Basic Metabolic Panel: Recent Labs  Lab 02/03/22 0317 02/04/22 0417 02/05/22 0602 02/06/22 0359 02/07/22 0729 02/08/22 0722  NA 139 136  --  137 134*  --   K 3.3* 3.8  --  4.1 3.7  --   CL 110 109  --  106 102  --   CO2 20* 20*  --  24 22  --   GLUCOSE 97 109*  --  143* 137*  --   BUN 10 10  --  13 16  --   CREATININE <0.30* <0.30*  --  <0.30* <0.30*  --   CALCIUM 9.5 9.1  --  9.5 9.3  --   MG 1.9 1.7 1.9 1.8  --  1.7  PHOS 4.0 3.5  --  3.2  --   --     ABG: No results for input(s): "PHART", "PCO2ART", "PO2ART", "HCO3", "O2SAT" in the last 168 hours.  Liver Function Tests: Recent Labs  Lab 02/03/22 0317 02/06/22 0359 02/07/22 0729  AST '17 15 15  '$ ALT 34 22 20  ALKPHOS 75 75 67  BILITOT 0.5 0.4 0.4  PROT 6.6 6.7 6.2*  ALBUMIN 3.1* 3.1* 3.0*   No results for input(s): "LIPASE", "AMYLASE" in the last 168 hours. No results for input(s): "AMMONIA" in the last 168 hours.  CBC: Recent Labs  Lab 02/03/22 0317  02/06/22 0359 02/07/22 0729 02/09/22 0820  WBC 7.0 8.8 8.2 7.5  HGB 9.5* 10.4* 9.7* 8.9*  HCT 29.0* 31.5* 29.7* 28.1*  MCV 90.3 89.7 91.4 91.5  PLT 203 207 200 231    Cardiac Enzymes: No results for input(s): "CKTOTAL", "CKMB", "CKMBINDEX", "TROPONINI" in the last 168 hours.  BNP (last 3 results) No results for input(s): "BNP" in the last 8760 hours.  ProBNP (last 3 results) No results for input(s): "PROBNP" in the last 8760 hours.  Radiological Exams: DG Chest Port 1 View  Result Date: 02/09/2022 CLINICAL DATA:  [id number], [id number] with ventilator dependent respiratory failure; [phone] with ileus. EXAM: PORTABLE CHEST 1 VIEW; PORTABLE ABDOMEN 1 VIEW COMPARISON:  Portable abdomen from 02/07/2022; portable chest 02/05/2022. FINDINGS: Chest: Persistent consolidation again noted in the left lower lobe, with adjacent mild elevation of the left hemidiaphragm and colonic interposition. Remainder of the lungs are clear. The cardiac size is normal with stable mediastinum. There is a small left pleural effusion. Right PICC terminates at about the superior cavoatrial junction as before, with right IJ port catheter terminating in the upper right atrium. Tracheostomy cannula tip is 3.9 cm from carina. Abdomen: Left upper quadrant gastrostomy tube, unchanged. There is diffuse gas  distention of the intestinal tract again noted with small bowel caliber up to 3.2 cm, with no pathologic dilatation in the colon. Similar intestinal aeration pattern was seen on August 6. There is no pneumatosis or supine evidence for free air; stable visceral shadows. IMPRESSION: 1. Unchanged left lower lobe consolidation with small left pleural effusion. 2. Unchanged ileus pattern and diffuse intestinal tract aeration. Electronically Signed   By: Telford Nab M.D.   On: 02/09/2022 06:07   DG Abd Portable 1V  Result Date: 02/09/2022 CLINICAL DATA:  [id number], [id number] with ventilator dependent respiratory failure; [phone] with ileus. EXAM:  PORTABLE CHEST 1 VIEW; PORTABLE ABDOMEN 1 VIEW COMPARISON:  Portable abdomen from 02/07/2022; portable chest 02/05/2022. FINDINGS: Chest: Persistent consolidation again noted in the left lower lobe, with adjacent mild elevation of the left hemidiaphragm and colonic interposition. Remainder of the lungs are clear. The cardiac size is normal with stable mediastinum. There is a small left pleural effusion. Right PICC terminates at about the superior cavoatrial junction as before, with right IJ port catheter terminating in the upper right atrium. Tracheostomy cannula tip is 3.9 cm from carina. Abdomen: Left upper quadrant gastrostomy tube, unchanged. There is diffuse gas distention of the intestinal tract again noted with small bowel caliber up to 3.2 cm, with no pathologic dilatation in the colon. Similar intestinal aeration pattern was seen on August 6. There is no pneumatosis or supine evidence for free air; stable visceral shadows. IMPRESSION: 1. Unchanged left lower lobe consolidation with small left pleural effusion. 2. Unchanged ileus pattern and diffuse intestinal tract aeration. Electronically Signed   By: Telford Nab M.D.   On: 02/09/2022 06:07    Assessment/Plan Active Problems:   ALS (amyotrophic lateral sclerosis) (HCC)   Septic shock (HCC)   Pseudomonas pneumonia (HCC)   Autonomic dysfunction   Acute on chronic respiratory failure with hypoxia (HCC)   Acute on chronic respiratory failure hypoxia basically patient has been refusing the attempts at TEVAR but he does okay with pressure support this is his baseline likely Sepsis with shock no change improved hemodynamics Pseudomonas pneumonia treated improved clinically Autonomic dysfunction supportive care ALS advanced severe end-stage disease   I have personally seen and evaluated the patient, evaluated laboratory and imaging results, formulated the assessment and plan and placed orders. The Patient requires high complexity decision  making with multiple systems involvement.  Rounds were done with the Respiratory Therapy Director and Staff therapists and discussed with nursing staff also.  Allyne Gee, MD [facility] Pulmonary Critical Care Medicine Sleep Medicine

## 2022-02-10 DIAGNOSIS — J151 Pneumonia due to Pseudomonas: Secondary | ICD-10-CM | POA: Diagnosis not present

## 2022-02-10 DIAGNOSIS — G909 Disorder of the autonomic nervous system, unspecified: Secondary | ICD-10-CM | POA: Diagnosis not present

## 2022-02-10 DIAGNOSIS — J9620 Acute and chronic respiratory failure, unspecified whether with hypoxia or hypercapnia: Secondary | ICD-10-CM | POA: Diagnosis not present

## 2022-02-10 DIAGNOSIS — F419 Anxiety disorder, unspecified: Secondary | ICD-10-CM | POA: Diagnosis not present

## 2022-02-10 DIAGNOSIS — A419 Sepsis, unspecified organism: Secondary | ICD-10-CM | POA: Diagnosis not present

## 2022-02-10 DIAGNOSIS — R6521 Severe sepsis with septic shock: Secondary | ICD-10-CM | POA: Diagnosis not present

## 2022-02-10 DIAGNOSIS — J9621 Acute and chronic respiratory failure with hypoxia: Secondary | ICD-10-CM | POA: Diagnosis not present

## 2022-02-10 DIAGNOSIS — E119 Type 2 diabetes mellitus without complications: Secondary | ICD-10-CM | POA: Diagnosis not present

## 2022-02-10 DIAGNOSIS — G1221 Amyotrophic lateral sclerosis: Secondary | ICD-10-CM | POA: Diagnosis not present

## 2022-02-10 LAB — TRIGLYCERIDES: Triglycerides: 61 mg/dL (ref <150)

## 2022-02-10 NOTE — Progress Notes (Signed)
Pulmonary Critical Care Medicine [place]   PULMONARY CRITICAL CARE SERVICE  PROGRESS NOTE     Saladin Petrelli  HUT:[id number]  DOB: Feb 10, 1971   DOA: 01/08/2022  Referring Physician: Satira Sark, MD  HPI: Rickardo Brinegar is a 51 y.o. male being followed for ventilator/airway/oxygen weaning Acute on Chronic Respiratory Failure.  Patient is currently on pressure support mode has been on 30% FiO2 with a pressure of 12/5 has been refusing to go on T collar still  Medications: Reviewed on Rounds  Physical Exam:  Vitals: Temperature is 97.7 pulse of 07/05/1978 respiratory 18 blood pressure is 111/53 saturations 100%  Ventilator Settings pressure support FiO2 30% pressure 12/5  General: Comfortable at this time Neck: supple Cardiovascular: no malignant arrhythmias Respiratory: Scattered rhonchi expansion is equal Skin: no rash seen on limited exam Musculoskeletal: No gross abnormality Psychiatric:unable to assess Neurologic:no involuntary movements         Lab Data:   Basic Metabolic Panel: Recent Labs  Lab 02/04/22 0417 02/05/22 0602 02/06/22 0359 02/07/22 0729 02/08/22 0722 02/09/22 0820  NA 136  --  137 134*  --  137  K 3.8  --  4.1 3.7  --  3.9  CL 109  --  106 102  --  103  CO2 20*  --  24 22  --  27  GLUCOSE 109*  --  143* 137*  --  106*  BUN 10  --  13 16  --  19  CREATININE <0.30*  --  <0.30* <0.30*  --  <0.30*  CALCIUM 9.1  --  9.5 9.3  --  9.3  MG 1.7 1.9 1.8  --  1.7 1.9  PHOS 3.5  --  3.2  --   --  3.7    ABG: No results for input(s): "PHART", "PCO2ART", "PO2ART", "HCO3", "O2SAT" in the last 168 hours.  Liver Function Tests: Recent Labs  Lab 02/06/22 0359 02/07/22 0729  AST 15 15  ALT 22 20  ALKPHOS 75 67  BILITOT 0.4 0.4  PROT 6.7 6.2*  ALBUMIN 3.1* 3.0*   No results for input(s): "LIPASE", "AMYLASE" in the last 168 hours. No results for input(s): "AMMONIA" in the last 168 hours.  CBC: Recent Labs  Lab  02/06/22 0359 02/07/22 0729 02/09/22 0820  WBC 8.8 8.2 7.5  HGB 10.4* 9.7* 8.9*  HCT 31.5* 29.7* 28.1*  MCV 89.7 91.4 91.5  PLT 207 200 231    Cardiac Enzymes: No results for input(s): "CKTOTAL", "CKMB", "CKMBINDEX", "TROPONINI" in the last 168 hours.  BNP (last 3 results) No results for input(s): "BNP" in the last 8760 hours.  ProBNP (last 3 results) No results for input(s): "PROBNP" in the last 8760 hours.  Radiological Exams: DG Chest Port 1 View  Result Date: 02/09/2022 CLINICAL DATA:  [id number], [id number] with ventilator dependent respiratory failure; [phone] with ileus. EXAM: PORTABLE CHEST 1 VIEW; PORTABLE ABDOMEN 1 VIEW COMPARISON:  Portable abdomen from 02/07/2022; portable chest 02/05/2022. FINDINGS: Chest: Persistent consolidation again noted in the left lower lobe, with adjacent mild elevation of the left hemidiaphragm and colonic interposition. Remainder of the lungs are clear. The cardiac size is normal with stable mediastinum. There is a small left pleural effusion. Right PICC terminates at about the superior cavoatrial junction as before, with right IJ port catheter terminating in the upper right atrium. Tracheostomy cannula tip is 3.9 cm from carina. Abdomen: Left upper quadrant gastrostomy tube, unchanged. There is diffuse gas distention of the intestinal tract again noted  with small bowel caliber up to 3.2 cm, with no pathologic dilatation in the colon. Similar intestinal aeration pattern was seen on August 6. There is no pneumatosis or supine evidence for free air; stable visceral shadows. IMPRESSION: 1. Unchanged left lower lobe consolidation with small left pleural effusion. 2. Unchanged ileus pattern and diffuse intestinal tract aeration. Electronically Signed   By: Telford Nab M.D.   On: 02/09/2022 06:07   DG Abd Portable 1V  Result Date: 02/09/2022 CLINICAL DATA:  [id number], [id number] with ventilator dependent respiratory failure; [phone] with ileus. EXAM: PORTABLE CHEST 1 VIEW;  PORTABLE ABDOMEN 1 VIEW COMPARISON:  Portable abdomen from 02/07/2022; portable chest 02/05/2022. FINDINGS: Chest: Persistent consolidation again noted in the left lower lobe, with adjacent mild elevation of the left hemidiaphragm and colonic interposition. Remainder of the lungs are clear. The cardiac size is normal with stable mediastinum. There is a small left pleural effusion. Right PICC terminates at about the superior cavoatrial junction as before, with right IJ port catheter terminating in the upper right atrium. Tracheostomy cannula tip is 3.9 cm from carina. Abdomen: Left upper quadrant gastrostomy tube, unchanged. There is diffuse gas distention of the intestinal tract again noted with small bowel caliber up to 3.2 cm, with no pathologic dilatation in the colon. Similar intestinal aeration pattern was seen on August 6. There is no pneumatosis or supine evidence for free air; stable visceral shadows. IMPRESSION: 1. Unchanged left lower lobe consolidation with small left pleural effusion. 2. Unchanged ileus pattern and diffuse intestinal tract aeration. Electronically Signed   By: Telford Nab M.D.   On: 02/09/2022 06:07    Assessment/Plan Active Problems:   ALS (amyotrophic lateral sclerosis) (HCC)   Septic shock (HCC)   Pseudomonas pneumonia (HCC)   Autonomic dysfunction   Acute on chronic respiratory failure with hypoxia (HCC)   Acute on chronic respiratory failure with hypoxia patient currently is on pressure support mode seems to be doing well.  Plan is to continue with pressure support titrate oxygen as tolerated ALS supportive care will continue to follow along closely Sepsis with shock hemodynamics are stable Pseudomonas pneumonia has been treated Autonomic dysfunction at baseline   I have personally seen and evaluated the patient, evaluated laboratory and imaging results, formulated the assessment and plan and placed orders. The Patient requires high complexity decision making  with multiple systems involvement.  Rounds were done with the Respiratory Therapy Director and Staff therapists and discussed with nursing staff also.  Allyne Gee, MD [facility] Pulmonary Critical Care Medicine Sleep Medicine

## 2022-02-11 ENCOUNTER — Other Ambulatory Visit (HOSPITAL_COMMUNITY): Payer: Self-pay

## 2022-02-11 DIAGNOSIS — G1221 Amyotrophic lateral sclerosis: Secondary | ICD-10-CM | POA: Diagnosis not present

## 2022-02-11 DIAGNOSIS — A419 Sepsis, unspecified organism: Secondary | ICD-10-CM | POA: Diagnosis not present

## 2022-02-11 DIAGNOSIS — E119 Type 2 diabetes mellitus without complications: Secondary | ICD-10-CM | POA: Diagnosis not present

## 2022-02-11 DIAGNOSIS — G909 Disorder of the autonomic nervous system, unspecified: Secondary | ICD-10-CM | POA: Diagnosis not present

## 2022-02-11 DIAGNOSIS — E46 Unspecified protein-calorie malnutrition: Secondary | ICD-10-CM | POA: Diagnosis not present

## 2022-02-11 DIAGNOSIS — J9621 Acute and chronic respiratory failure with hypoxia: Secondary | ICD-10-CM | POA: Diagnosis not present

## 2022-02-11 DIAGNOSIS — K6389 Other specified diseases of intestine: Secondary | ICD-10-CM | POA: Diagnosis not present

## 2022-02-11 DIAGNOSIS — R6521 Severe sepsis with septic shock: Secondary | ICD-10-CM | POA: Diagnosis not present

## 2022-02-11 DIAGNOSIS — K567 Ileus, unspecified: Secondary | ICD-10-CM | POA: Diagnosis not present

## 2022-02-11 DIAGNOSIS — F419 Anxiety disorder, unspecified: Secondary | ICD-10-CM | POA: Diagnosis not present

## 2022-02-11 DIAGNOSIS — J151 Pneumonia due to Pseudomonas: Secondary | ICD-10-CM | POA: Diagnosis not present

## 2022-02-11 NOTE — Progress Notes (Signed)
Pulmonary Critical Care Medicine [place]   PULMONARY CRITICAL CARE SERVICE  PROGRESS NOTE     Douglass Dunshee  TDV:[id number]  DOB: August 23, 1970   DOA: 01/08/2022  Referring Physician: Satira Sark, MD  HPI: Jerry [name] is a 51 y.o. male being followed for ventilator/airway/oxygen weaning Acute on Chronic Respiratory Failure.  Patient is on pressure support currently on a pressure of 12/5 16-hour goal  Medications: Reviewed on Rounds  Physical Exam:  Vitals: Temperature is 97.6 pulse 73 respiratory 16 blood pressure is 07/29/1970 saturations 100%  Ventilator Settings on pressure support currently 12/5  General: Comfortable at this time Neck: supple Cardiovascular: no malignant arrhythmias Respiratory: No rhonchi no rales Skin: no rash seen on limited exam Musculoskeletal: No gross abnormality Psychiatric:unable to assess Neurologic:no involuntary movements         Lab Data:   Basic Metabolic Panel: Recent Labs  Lab 02/05/22 0602 02/06/22 0359 02/07/22 0729 02/08/22 0722 02/09/22 0820  NA  --  137 134*  --  137  K  --  4.1 3.7  --  3.9  CL  --  106 102  --  103  CO2  --  24 22  --  27  GLUCOSE  --  143* 137*  --  106*  BUN  --  13 16  --  19  CREATININE  --  <0.30* <0.30*  --  <0.30*  CALCIUM  --  9.5 9.3  --  9.3  MG 1.9 1.8  --  1.7 1.9  PHOS  --  3.2  --   --  3.7    ABG: No results for input(s): "PHART", "PCO2ART", "PO2ART", "HCO3", "O2SAT" in the last 168 hours.  Liver Function Tests: Recent Labs  Lab 02/06/22 0359 02/07/22 0729  AST 15 15  ALT 22 20  ALKPHOS 75 67  BILITOT 0.4 0.4  PROT 6.7 6.2*  ALBUMIN 3.1* 3.0*   No results for input(s): "LIPASE", "AMYLASE" in the last 168 hours. No results for input(s): "AMMONIA" in the last 168 hours.  CBC: Recent Labs  Lab 02/06/22 0359 02/07/22 0729 02/09/22 0820  WBC 8.8 8.2 7.5  HGB 10.4* 9.7* 8.9*  HCT 31.5* 29.7* 28.1*  MCV 89.7 91.4 91.5  PLT 207 200 231     Cardiac Enzymes: No results for input(s): "CKTOTAL", "CKMB", "CKMBINDEX", "TROPONINI" in the last 168 hours.  BNP (last 3 results) No results for input(s): "BNP" in the last 8760 hours.  ProBNP (last 3 results) No results for input(s): "PROBNP" in the last 8760 hours.  Radiological Exams: DG Abd Portable 1V  Result Date: 02/11/2022 CLINICAL DATA:  51 year old male with history of ileus. EXAM: PORTABLE ABDOMEN - 1 VIEW COMPARISON:  02/09/2022. FINDINGS: Gaseous distension is noted throughout the visualized portions of small bowel and colon, including some distal rectal gas. Small bowel loops measure up to 3.8 cm in diameter in the left side of the abdomen. No definite pneumoperitoneum. IMPRESSION: 1. Bowel-gas pattern remains most compatible with an ileus, as above, very similar to the prior study. Electronically Signed   By: Vinnie Langton M.D.   On: 02/11/2022 05:48    Assessment/Plan Active Problems:   ALS (amyotrophic lateral sclerosis) (HCC)   Septic shock (HCC)   Pseudomonas pneumonia (HCC)   Autonomic dysfunction   Acute on chronic respiratory failure with hypoxia (HCC)   Acute on chronic respiratory failure hypoxia we will keep the patient weaning on 16-hour of pressure support during daytime and go back on  the ventilator at night.  Family training is necessary for home ventilator Septic shock resolved Pseudomonas he has been treated continue to monitor Autonomic dysfunction supportive care has been stable ALS advanced severe disease vent dependent   I have personally seen and evaluated the patient, evaluated laboratory and imaging results, formulated the assessment and plan and placed orders. The Patient requires high complexity decision making with multiple systems involvement.  Rounds were done with the Respiratory Therapy Director and Staff therapists and discussed with nursing staff also.  Allyne Gee, MD [facility] Pulmonary Critical Care Medicine Sleep  Medicine

## 2022-02-12 ENCOUNTER — Ambulatory Visit: Payer: Self-pay

## 2022-02-12 ENCOUNTER — Encounter (HOSPITAL_BASED_OUTPATIENT_CLINIC_OR_DEPARTMENT_OTHER): Payer: Self-pay

## 2022-02-12 DIAGNOSIS — J9621 Acute and chronic respiratory failure with hypoxia: Secondary | ICD-10-CM

## 2022-02-12 DIAGNOSIS — G909 Disorder of the autonomic nervous system, unspecified: Secondary | ICD-10-CM | POA: Diagnosis not present

## 2022-02-12 DIAGNOSIS — J151 Pneumonia due to Pseudomonas: Secondary | ICD-10-CM | POA: Diagnosis not present

## 2022-02-12 DIAGNOSIS — R6521 Severe sepsis with septic shock: Secondary | ICD-10-CM

## 2022-02-12 DIAGNOSIS — G1221 Amyotrophic lateral sclerosis: Secondary | ICD-10-CM

## 2022-02-12 DIAGNOSIS — A419 Sepsis, unspecified organism: Secondary | ICD-10-CM

## 2022-02-12 DIAGNOSIS — F419 Anxiety disorder, unspecified: Secondary | ICD-10-CM | POA: Diagnosis not present

## 2022-02-12 DIAGNOSIS — J9690 Respiratory failure, unspecified, unspecified whether with hypoxia or hypercapnia: Secondary | ICD-10-CM | POA: Diagnosis not present

## 2022-02-12 DIAGNOSIS — E119 Type 2 diabetes mellitus without complications: Secondary | ICD-10-CM | POA: Diagnosis not present

## 2022-02-12 LAB — BASIC METABOLIC PANEL
Anion gap: 7 (ref 5–15)
BUN: 22 mg/dL — ABNORMAL HIGH (ref 6–20)
CO2: 23 mmol/L (ref 22–32)
Calcium: 9.3 mg/dL (ref 8.9–10.3)
Chloride: 106 mmol/L (ref 98–111)
Creatinine, Ser: <0.3 mg/dL — ABNORMAL LOW (ref 0.61–1.24)
Glucose, Bld: 119 mg/dL — ABNORMAL HIGH (ref 70–99)
Potassium: 3.5 mmol/L (ref 3.5–5.1)
Sodium: 136 mmol/L (ref 135–145)

## 2022-02-12 LAB — CBC
HCT: 25.9 % — ABNORMAL LOW (ref 39.0–52.0)
Hemoglobin: 8.2 g/dL — ABNORMAL LOW (ref 13.0–17.0)
MCH: 29.4 pg (ref 26.0–34.0)
MCHC: 31.7 g/dL (ref 30.0–36.0)
MCV: 92.8 fL (ref 80.0–100.0)
Platelets: 214 10*3/uL (ref 150–400)
RBC: 2.79 MIL/uL — ABNORMAL LOW (ref 4.22–5.81)
RDW: 13.8 % (ref 11.5–15.5)
WBC: 4.9 10*3/uL (ref 4.0–10.5)
nRBC: 0 % (ref 0.0–0.2)

## 2022-02-12 LAB — PHOSPHORUS: Phosphorus: 4 mg/dL (ref 2.5–4.6)

## 2022-02-12 LAB — MAGNESIUM: Magnesium: 1.8 mg/dL (ref 1.7–2.4)

## 2022-02-12 NOTE — Progress Notes (Signed)
RUE venous duplex has been completed.   Results can be found under chart review under CV PROC. 02/12/2022 5:26 PM Lannette Avellino RVT, RDMS

## 2022-02-12 NOTE — Progress Notes (Signed)
Pulmonary Critical Care Medicine [place]   PULMONARY CRITICAL CARE SERVICE  PROGRESS NOTE     Jerry [name]  OVF:[id number]  DOB: 08-04-1970   DOA: 01/08/2022  Referring Physician: Satira Sark, MD  HPI: Jerry [name] is a 51 y.o. male being followed for ventilator/airway/oxygen weaning Acute on Chronic Respiratory Failure.  Patient is currently on assist-control mode has been on 30% FiO2 supposed to be doing a pressure support wean  Medications: Reviewed on Rounds  Physical Exam:  Vitals: Temperature is 97 pulse 65 respiratory 17 blood pressure is 113/67 saturations 100%  Ventilator Settings assist-control FiO2 is 30% tidal volume 500 PEEP 5  General: Comfortable at this time Neck: supple Cardiovascular: no malignant arrhythmias Respiratory: Coarse rhonchi are noted bilaterally Skin: no rash seen on limited exam Musculoskeletal: No gross abnormality Psychiatric:unable to assess Neurologic:no involuntary movements         Lab Data:   Basic Metabolic Panel: Recent Labs  Lab 02/06/22 0359 02/07/22 0729 02/08/22 0722 02/09/22 0820 02/12/22 0501  NA 137 134*  --  137 136  K 4.1 3.7  --  3.9 3.5  CL 106 102  --  103 106  CO2 24 22  --  27 23  GLUCOSE 143* 137*  --  106* 119*  BUN 13 16  --  19 22*  CREATININE <0.30* <0.30*  --  <0.30* <0.30*  CALCIUM 9.5 9.3  --  9.3 9.3  MG 1.8  --  1.7 1.9 1.8  PHOS 3.2  --   --  3.7 4.0    ABG: No results for input(s): "PHART", "PCO2ART", "PO2ART", "HCO3", "O2SAT" in the last 168 hours.  Liver Function Tests: Recent Labs  Lab 02/06/22 0359 02/07/22 0729  AST 15 15  ALT 22 20  ALKPHOS 75 67  BILITOT 0.4 0.4  PROT 6.7 6.2*  ALBUMIN 3.1* 3.0*   No results for input(s): "LIPASE", "AMYLASE" in the last 168 hours. No results for input(s): "AMMONIA" in the last 168 hours.  CBC: Recent Labs  Lab 02/06/22 0359 02/07/22 0729 02/09/22 0820 02/12/22 0501  WBC 8.8 8.2 7.5 4.9  HGB 10.4* 9.7*  8.9* 8.2*  HCT 31.5* 29.7* 28.1* 25.9*  MCV 89.7 91.4 91.5 92.8  PLT 207 200 231 214    Cardiac Enzymes: No results for input(s): "CKTOTAL", "CKMB", "CKMBINDEX", "TROPONINI" in the last 168 hours.  BNP (last 3 results) No results for input(s): "BNP" in the last 8760 hours.  ProBNP (last 3 results) No results for input(s): "PROBNP" in the last 8760 hours.  Radiological Exams: DG Abd Portable 1V  Result Date: 02/11/2022 CLINICAL DATA:  51 year old male with history of ileus. EXAM: PORTABLE ABDOMEN - 1 VIEW COMPARISON:  02/09/2022. FINDINGS: Gaseous distension is noted throughout the visualized portions of small bowel and colon, including some distal rectal gas. Small bowel loops measure up to 3.8 cm in diameter in the left side of the abdomen. No definite pneumoperitoneum. IMPRESSION: 1. Bowel-gas pattern remains most compatible with an ileus, as above, very similar to the prior study. Electronically Signed   By: Vinnie Langton M.D.   On: 02/11/2022 05:48    Assessment/Plan Active Problems:   ALS (amyotrophic lateral sclerosis) (HCC)   Septic shock (HCC)   Pseudomonas pneumonia (HCC)   Autonomic dysfunction   Acute on chronic respiratory failure with hypoxia (HCC)   Acute on chronic respiratory failure with hypoxia we will continue with full support on the ventilator.  Patient is currently on pressure support  during the daytime and then rest on the ventilator at nighttime Septic shock resolved ALS severe advanced disease supportive care Pseudomonas pneumonia no change we will continue to monitor Autonomic dysfunction patient is at baseline   I have personally seen and evaluated the patient, evaluated laboratory and imaging results, formulated the assessment and plan and placed orders. The Patient requires high complexity decision making with multiple systems involvement.  Rounds were done with the Respiratory Therapy Director and Staff therapists and discussed with nursing staff  also.  Allyne Gee, MD [facility] Pulmonary Critical Care Medicine Sleep Medicine

## 2022-02-13 DIAGNOSIS — F419 Anxiety disorder, unspecified: Secondary | ICD-10-CM | POA: Diagnosis not present

## 2022-02-13 DIAGNOSIS — J9620 Acute and chronic respiratory failure, unspecified whether with hypoxia or hypercapnia: Secondary | ICD-10-CM | POA: Diagnosis not present

## 2022-02-13 DIAGNOSIS — E119 Type 2 diabetes mellitus without complications: Secondary | ICD-10-CM | POA: Diagnosis not present

## 2022-02-13 DIAGNOSIS — L89151 Pressure ulcer of sacral region, stage 1: Secondary | ICD-10-CM | POA: Diagnosis not present

## 2022-02-14 ENCOUNTER — Other Ambulatory Visit (HOSPITAL_COMMUNITY): Payer: Self-pay

## 2022-02-14 DIAGNOSIS — J9620 Acute and chronic respiratory failure, unspecified whether with hypoxia or hypercapnia: Secondary | ICD-10-CM | POA: Diagnosis not present

## 2022-02-14 DIAGNOSIS — R6521 Severe sepsis with septic shock: Secondary | ICD-10-CM | POA: Diagnosis not present

## 2022-02-14 DIAGNOSIS — K6389 Other specified diseases of intestine: Secondary | ICD-10-CM | POA: Diagnosis not present

## 2022-02-14 DIAGNOSIS — K567 Ileus, unspecified: Secondary | ICD-10-CM | POA: Diagnosis not present

## 2022-02-14 DIAGNOSIS — G909 Disorder of the autonomic nervous system, unspecified: Secondary | ICD-10-CM | POA: Diagnosis not present

## 2022-02-14 DIAGNOSIS — E119 Type 2 diabetes mellitus without complications: Secondary | ICD-10-CM | POA: Diagnosis not present

## 2022-02-14 DIAGNOSIS — L89151 Pressure ulcer of sacral region, stage 1: Secondary | ICD-10-CM | POA: Diagnosis not present

## 2022-02-14 DIAGNOSIS — Z931 Gastrostomy status: Secondary | ICD-10-CM | POA: Diagnosis not present

## 2022-02-14 DIAGNOSIS — A419 Sepsis, unspecified organism: Secondary | ICD-10-CM | POA: Diagnosis not present

## 2022-02-14 DIAGNOSIS — J9621 Acute and chronic respiratory failure with hypoxia: Secondary | ICD-10-CM | POA: Diagnosis not present

## 2022-02-14 DIAGNOSIS — J151 Pneumonia due to Pseudomonas: Secondary | ICD-10-CM | POA: Diagnosis not present

## 2022-02-14 DIAGNOSIS — G1221 Amyotrophic lateral sclerosis: Secondary | ICD-10-CM | POA: Diagnosis not present

## 2022-02-14 DIAGNOSIS — F419 Anxiety disorder, unspecified: Secondary | ICD-10-CM | POA: Diagnosis not present

## 2022-02-14 LAB — BASIC METABOLIC PANEL
Anion gap: 6 (ref 5–15)
BUN: 18 mg/dL (ref 6–20)
CO2: 25 mmol/L (ref 22–32)
Calcium: 9.5 mg/dL (ref 8.9–10.3)
Chloride: 109 mmol/L (ref 98–111)
Creatinine, Ser: <0.3 mg/dL — ABNORMAL LOW (ref 0.61–1.24)
Glucose, Bld: 115 mg/dL — ABNORMAL HIGH (ref 70–99)
Potassium: 3.9 mmol/L (ref 3.5–5.1)
Sodium: 140 mmol/L (ref 135–145)

## 2022-02-14 LAB — MAGNESIUM: Magnesium: 1.8 mg/dL (ref 1.7–2.4)

## 2022-02-14 NOTE — Progress Notes (Cosign Needed)
Pulmonary Critical Care Medicine [place]   PULMONARY CRITICAL CARE SERVICE  PROGRESS NOTE     Jerry [name]  NFA:[id number]  DOB: October 10, 1970   DOA: 01/08/2022  Referring Physician: Satira Sark, MD  HPI: Jerry [name] is a 51 y.o. male being followed for ventilator/airway/oxygen weaning Acute on Chronic Respiratory Failure.  Patient seen lying in bed, currently on pressure support.  Baseline is now 8 hours of full support at night and 6 hours of pressure support as tolerated during the day.  No acute distress, no acute overnight events.  Medications: Reviewed on Rounds  Physical Exam:  Vitals: Temp 99.2, pulse 85, respirations 16, BP 97/65, SPO2 96%  Ventilator Settings pressure support 12/5, FiO2 30%  General: Comfortable at this time Neck: supple Cardiovascular: no malignant arrhythmias Respiratory: Bilaterally clear Skin: no rash seen on limited exam Musculoskeletal: No gross abnormality Psychiatric:unable to assess Neurologic:no involuntary movements         Lab Data:   Basic Metabolic Panel: Recent Labs  Lab 02/08/22 0722 02/09/22 0820 02/12/22 0501 02/14/22 0310  NA  --  137 136 140  K  --  3.9 3.5 3.9  CL  --  103 106 109  CO2  --  '27 23 25  '$ GLUCOSE  --  106* 119* 115*  BUN  --  19 22* 18  CREATININE  --  <0.30* <0.30* <0.30*  CALCIUM  --  9.3 9.3 9.5  MG 1.7 1.9 1.8 1.8  PHOS  --  3.7 4.0  --     ABG: No results for input(s): "PHART", "PCO2ART", "PO2ART", "HCO3", "O2SAT" in the last 168 hours.  Liver Function Tests: No results for input(s): "AST", "ALT", "ALKPHOS", "BILITOT", "PROT", "ALBUMIN" in the last 168 hours. No results for input(s): "LIPASE", "AMYLASE" in the last 168 hours. No results for input(s): "AMMONIA" in the last 168 hours.  CBC: Recent Labs  Lab 02/09/22 0820 02/12/22 0501  WBC 7.5 4.9  HGB 8.9* 8.2*  HCT 28.1* 25.9*  MCV 91.5 92.8  PLT 231 214    Cardiac Enzymes: No results for input(s):  "CKTOTAL", "CKMB", "CKMBINDEX", "TROPONINI" in the last 168 hours.  BNP (last 3 results) No results for input(s): "BNP" in the last 8760 hours.  ProBNP (last 3 results) No results for input(s): "PROBNP" in the last 8760 hours.  Radiological Exams: DG Abd Portable 1V  Result Date: 02/14/2022 CLINICAL DATA:  Ileus EXAM: PORTABLE ABDOMEN - 1 VIEW COMPARISON:  Prior chest x-ray 02/11/2022 FINDINGS: Gastric tube in unchanged position. Diffuse gas throughout small and large bowel without significant dilation on today's exam. No acute osseous abnormality. IMPRESSION: 1. Decreased dilation of small bowel compared to prior radiograph. 2. Gastrostomy tube in place. Electronically Signed   By: Jacqulynn Cadet M.D.   On: 02/14/2022 08:00    Assessment/Plan Active Problems:   ALS (amyotrophic lateral sclerosis) (HCC)   Septic shock (HCC)   Pseudomonas pneumonia (HCC)   Autonomic dysfunction   Acute on chronic respiratory failure with hypoxia (HCC)  Acute on chronic respiratory failure with hypoxia-patient continues to be 16 hours pressure support in the day as tolerated with 8 hours of full support at night.  Currently tolerating well.  Continue with supportive care. Septic shock -resolved ALS severe advanced disease-continue with supportive care. Pseudomonas pneumonia-no overall change.  Continue with supportive care. Autonomic dysfunction- patient is at baseline.     I have personally seen and evaluated the patient, evaluated laboratory and imaging results, formulated the assessment  and plan and placed orders. The Patient requires high complexity decision making with multiple systems involvement.  Rounds were done with the Respiratory Therapy Director and Staff therapists and discussed with nursing staff also.  Allyne Gee, MD [facility] Pulmonary Critical Care Medicine Sleep Medicine

## 2022-02-15 DIAGNOSIS — G909 Disorder of the autonomic nervous system, unspecified: Secondary | ICD-10-CM | POA: Diagnosis not present

## 2022-02-15 DIAGNOSIS — J9621 Acute and chronic respiratory failure with hypoxia: Secondary | ICD-10-CM | POA: Diagnosis not present

## 2022-02-15 DIAGNOSIS — R6521 Severe sepsis with septic shock: Secondary | ICD-10-CM | POA: Diagnosis not present

## 2022-02-15 DIAGNOSIS — L89151 Pressure ulcer of sacral region, stage 1: Secondary | ICD-10-CM | POA: Diagnosis not present

## 2022-02-15 DIAGNOSIS — E119 Type 2 diabetes mellitus without complications: Secondary | ICD-10-CM | POA: Diagnosis not present

## 2022-02-15 DIAGNOSIS — F419 Anxiety disorder, unspecified: Secondary | ICD-10-CM | POA: Diagnosis not present

## 2022-02-15 DIAGNOSIS — J151 Pneumonia due to Pseudomonas: Secondary | ICD-10-CM | POA: Diagnosis not present

## 2022-02-15 DIAGNOSIS — G1221 Amyotrophic lateral sclerosis: Secondary | ICD-10-CM | POA: Diagnosis not present

## 2022-02-15 DIAGNOSIS — J9620 Acute and chronic respiratory failure, unspecified whether with hypoxia or hypercapnia: Secondary | ICD-10-CM | POA: Diagnosis not present

## 2022-02-15 DIAGNOSIS — A419 Sepsis, unspecified organism: Secondary | ICD-10-CM | POA: Diagnosis not present

## 2022-02-15 LAB — PHOSPHORUS: Phosphorus: 3.9 mg/dL (ref 2.5–4.6)

## 2022-02-15 LAB — MAGNESIUM: Magnesium: 1.9 mg/dL (ref 1.7–2.4)

## 2022-02-15 NOTE — Progress Notes (Cosign Needed)
Pulmonary Critical Care Medicine [place]   PULMONARY CRITICAL CARE SERVICE  PROGRESS NOTE     Champ Keetch  LYY:[id number]  DOB: 1971/03/05   DOA: 01/08/2022  Referring Physician: Satira Sark, MD  HPI: Jerry [name] is a 51 y.o. male being followed for ventilator/airway/oxygen weaning Acute on Chronic Respiratory Failure.  Patient seen lying in bed, currently on pressure support.  Completed 9 out of 16 hours pressure support yesterday.  He did complain that he was tired and requested to be placed back on full support.  No acute distress, no acute overnight events.  Medications: Reviewed on Rounds  Physical Exam:  Vitals: Temp 97.4, pulse 75, respirations 16, BP 148/71, SPO2 100%  Ventilator Settings pressure support 12/5, FiO2 30%  General: Comfortable at this time Neck: supple Cardiovascular: no malignant arrhythmias Respiratory: Bilaterally clear Skin: no rash seen on limited exam Musculoskeletal: No gross abnormality Psychiatric:unable to assess Neurologic:no involuntary movements         Lab Data:   Basic Metabolic Panel: Recent Labs  Lab 02/09/22 0820 02/12/22 0501 02/14/22 0310  NA 137 136 140  K 3.9 3.5 3.9  CL 103 106 109  CO2 '27 23 25  '$ GLUCOSE 106* 119* 115*  BUN 19 22* 18  CREATININE <0.30* <0.30* <0.30*  CALCIUM 9.3 9.3 9.5  MG 1.9 1.8 1.8  PHOS 3.7 4.0  --     ABG: No results for input(s): "PHART", "PCO2ART", "PO2ART", "HCO3", "O2SAT" in the last 168 hours.  Liver Function Tests: No results for input(s): "AST", "ALT", "ALKPHOS", "BILITOT", "PROT", "ALBUMIN" in the last 168 hours. No results for input(s): "LIPASE", "AMYLASE" in the last 168 hours. No results for input(s): "AMMONIA" in the last 168 hours.  CBC: Recent Labs  Lab 02/09/22 0820 02/12/22 0501  WBC 7.5 4.9  HGB 8.9* 8.2*  HCT 28.1* 25.9*  MCV 91.5 92.8  PLT 231 214    Cardiac Enzymes: No results for input(s): "CKTOTAL", "CKMB", "CKMBINDEX",  "TROPONINI" in the last 168 hours.  BNP (last 3 results) No results for input(s): "BNP" in the last 8760 hours.  ProBNP (last 3 results) No results for input(s): "PROBNP" in the last 8760 hours.  Radiological Exams: DG Abd Portable 1V  Result Date: 02/14/2022 CLINICAL DATA:  Ileus EXAM: PORTABLE ABDOMEN - 1 VIEW COMPARISON:  Prior chest x-ray 02/11/2022 FINDINGS: Gastric tube in unchanged position. Diffuse gas throughout small and large bowel without significant dilation on today's exam. No acute osseous abnormality. IMPRESSION: 1. Decreased dilation of small bowel compared to prior radiograph. 2. Gastrostomy tube in place. Electronically Signed   By: Jacqulynn Cadet M.D.   On: 02/14/2022 08:00    Assessment/Plan Active Problems:   ALS (amyotrophic lateral sclerosis) (HCC)   Septic shock (HCC)   Pseudomonas pneumonia (HCC)   Autonomic dysfunction   Acute on chronic respiratory failure with hypoxia (HCC)   Acute on chronic respiratory failure with hypoxia-patient continues to be 16 hours pressure support in the day as tolerated with 8 hours of full support at night.  Completed only 9 hours of 16-hour pressure support yesterday due to requesting to be placed back on full support as he was tired.  Currently tolerating well.  Continue with supportive care. Septic shock -resolved ALS severe advanced disease-continue with supportive care. Pseudomonas pneumonia-no overall change.  Continue with supportive care. Autonomic dysfunction- patient is at baseline.  I have personally seen and evaluated the patient, evaluated laboratory and imaging results, formulated the assessment and plan and  placed orders. The Patient requires high complexity decision making with multiple systems involvement.  Rounds were done with the Respiratory Therapy Director and Staff therapists and discussed with nursing staff also.  Allyne Gee, MD [facility] Pulmonary Critical Care Medicine Sleep Medicine

## 2022-02-16 ENCOUNTER — Other Ambulatory Visit (HOSPITAL_COMMUNITY): Payer: Self-pay

## 2022-02-16 DIAGNOSIS — R6521 Severe sepsis with septic shock: Secondary | ICD-10-CM | POA: Diagnosis not present

## 2022-02-16 DIAGNOSIS — I878 Other specified disorders of veins: Secondary | ICD-10-CM | POA: Diagnosis not present

## 2022-02-16 DIAGNOSIS — L89151 Pressure ulcer of sacral region, stage 1: Secondary | ICD-10-CM | POA: Diagnosis not present

## 2022-02-16 DIAGNOSIS — F419 Anxiety disorder, unspecified: Secondary | ICD-10-CM | POA: Diagnosis not present

## 2022-02-16 DIAGNOSIS — G909 Disorder of the autonomic nervous system, unspecified: Secondary | ICD-10-CM | POA: Diagnosis not present

## 2022-02-16 DIAGNOSIS — J9621 Acute and chronic respiratory failure with hypoxia: Secondary | ICD-10-CM | POA: Diagnosis not present

## 2022-02-16 DIAGNOSIS — J151 Pneumonia due to Pseudomonas: Secondary | ICD-10-CM | POA: Diagnosis not present

## 2022-02-16 DIAGNOSIS — G1221 Amyotrophic lateral sclerosis: Secondary | ICD-10-CM | POA: Diagnosis not present

## 2022-02-16 DIAGNOSIS — E119 Type 2 diabetes mellitus without complications: Secondary | ICD-10-CM | POA: Diagnosis not present

## 2022-02-16 DIAGNOSIS — K567 Ileus, unspecified: Secondary | ICD-10-CM | POA: Diagnosis not present

## 2022-02-16 DIAGNOSIS — A419 Sepsis, unspecified organism: Secondary | ICD-10-CM | POA: Diagnosis not present

## 2022-02-16 DIAGNOSIS — J9620 Acute and chronic respiratory failure, unspecified whether with hypoxia or hypercapnia: Secondary | ICD-10-CM | POA: Diagnosis not present

## 2022-02-16 LAB — BASIC METABOLIC PANEL
Anion gap: 7 (ref 5–15)
BUN: 19 mg/dL (ref 6–20)
CO2: 26 mmol/L (ref 22–32)
Calcium: 9.6 mg/dL (ref 8.9–10.3)
Chloride: 106 mmol/L (ref 98–111)
Creatinine, Ser: <0.3 mg/dL — ABNORMAL LOW (ref 0.61–1.24)
Glucose, Bld: 95 mg/dL (ref 70–99)
Potassium: 3.9 mmol/L (ref 3.5–5.1)
Sodium: 139 mmol/L (ref 135–145)

## 2022-02-16 LAB — CBC
HCT: 29.7 % — ABNORMAL LOW (ref 39.0–52.0)
Hemoglobin: 9.3 g/dL — ABNORMAL LOW (ref 13.0–17.0)
MCH: 28.8 pg (ref 26.0–34.0)
MCHC: 31.3 g/dL (ref 30.0–36.0)
MCV: 92 fL (ref 80.0–100.0)
Platelets: 184 10*3/uL (ref 150–400)
RBC: 3.23 MIL/uL — ABNORMAL LOW (ref 4.22–5.81)
RDW: 14.2 % (ref 11.5–15.5)
WBC: 7.3 10*3/uL (ref 4.0–10.5)
nRBC: 0 % (ref 0.0–0.2)

## 2022-02-16 LAB — MAGNESIUM: Magnesium: 1.8 mg/dL (ref 1.7–2.4)

## 2022-02-16 NOTE — Progress Notes (Addendum)
Pulmonary Critical Care Medicine [place]   PULMONARY CRITICAL CARE SERVICE  PROGRESS NOTE     Jerry [name]  LDJ:[id number]  DOB: 26-Apr-1971   DOA: 01/08/2022  Referring Physician: Satira Sark, MD  HPI: Jerry [name] is a 51 y.o. male being followed for ventilator/airway/oxygen weaning Acute on Chronic Respiratory Failure.  Patient seen lying in bed, currently on pressure support.  We will continue with 16-hour pressure support during the day as tolerated with 8-hour full support at night.  No acute distress, no acute overnight events.  Medications: Reviewed on Rounds  Physical Exam:  Vitals: Temp 97.0, pulse 65, respirations 15, BP 109/68, SPO2 100%  Ventilator Settings pressure support 12/5, FiO2 30%  General: Comfortable at this time Neck: supple Cardiovascular: no malignant arrhythmias Respiratory: Bilaterally clear Skin: no rash seen on limited exam Musculoskeletal: No gross abnormality Psychiatric:unable to assess Neurologic:no involuntary movements         Lab Data:   Basic Metabolic Panel: Recent Labs  Lab 02/12/22 0501 02/14/22 0310 02/15/22 0756 02/16/22 0550  NA 136 140  --  139  K 3.5 3.9  --  3.9  CL 106 109  --  106  CO2 23 25  --  26  GLUCOSE 119* 115*  --  95  BUN 22* 18  --  19  CREATININE <0.30* <0.30*  --  <0.30*  CALCIUM 9.3 9.5  --  9.6  MG 1.8 1.8 1.9 1.8  PHOS 4.0  --  3.9  --     ABG: No results for input(s): "PHART", "PCO2ART", "PO2ART", "HCO3", "O2SAT" in the last 168 hours.  Liver Function Tests: No results for input(s): "AST", "ALT", "ALKPHOS", "BILITOT", "PROT", "ALBUMIN" in the last 168 hours. No results for input(s): "LIPASE", "AMYLASE" in the last 168 hours. No results for input(s): "AMMONIA" in the last 168 hours.  CBC: Recent Labs  Lab 02/12/22 0501 02/16/22 0550  WBC 4.9 7.3  HGB 8.2* 9.3*  HCT 25.9* 29.7*  MCV 92.8 92.0  PLT 214 184    Cardiac Enzymes: No results for input(s):  "CKTOTAL", "CKMB", "CKMBINDEX", "TROPONINI" in the last 168 hours.  BNP (last 3 results) No results for input(s): "BNP" in the last 8760 hours.  ProBNP (last 3 results) No results for input(s): "PROBNP" in the last 8760 hours.  Radiological Exams: DG Abd 1 View  Result Date: 02/16/2022 CLINICAL DATA:  51 year old male with ALS.  Ileus. EXAM: ABDOMEN - 1 VIEW COMPARISON:  02/14/2022 and earlier. FINDINGS: Portable AP supine view at 0645 hours. Gas-filled small and large bowel loops throughout the abdomen and pelvis do not appear significantly changed from 01/26/2022. Percutaneous gastrostomy tube is visible. Pelvic phleboliths again noted. Osteopenia. IMPRESSION: Ileus, not significantly changed from last month. Electronically Signed   By: Genevie Ann M.D.   On: 02/16/2022 07:32    Assessment/Plan Active Problems:   ALS (amyotrophic lateral sclerosis) (HCC)   Septic shock (HCC)   Pseudomonas pneumonia (HCC)   Autonomic dysfunction   Acute on chronic respiratory failure with hypoxia (HCC)   Acute on chronic respiratory failure with hypoxia-patient continues to be 16 hours pressure support in the day as tolerated with 8 hours of full support at night. Currently tolerating well.  Continue with supportive care. Septic shock -resolved ALS severe advanced disease-continue with supportive care. Pseudomonas pneumonia-no overall change.  Continue with supportive care. Autonomic dysfunction- patient is at baseline.  I have personally seen and evaluated the patient, evaluated laboratory and imaging results, formulated  the assessment and plan and placed orders. The Patient requires high complexity decision making with multiple systems involvement.  Rounds were done with the Respiratory Therapy Director and Staff therapists and discussed with nursing staff also.  Allyne Gee, MD [facility] Pulmonary Critical Care Medicine Sleep Medicine

## 2022-02-17 DIAGNOSIS — G1221 Amyotrophic lateral sclerosis: Secondary | ICD-10-CM | POA: Diagnosis not present

## 2022-02-17 DIAGNOSIS — J151 Pneumonia due to Pseudomonas: Secondary | ICD-10-CM | POA: Diagnosis not present

## 2022-02-17 DIAGNOSIS — J9620 Acute and chronic respiratory failure, unspecified whether with hypoxia or hypercapnia: Secondary | ICD-10-CM | POA: Diagnosis not present

## 2022-02-17 DIAGNOSIS — E119 Type 2 diabetes mellitus without complications: Secondary | ICD-10-CM | POA: Diagnosis not present

## 2022-02-17 DIAGNOSIS — G909 Disorder of the autonomic nervous system, unspecified: Secondary | ICD-10-CM | POA: Diagnosis not present

## 2022-02-17 DIAGNOSIS — F419 Anxiety disorder, unspecified: Secondary | ICD-10-CM | POA: Diagnosis not present

## 2022-02-17 DIAGNOSIS — R6521 Severe sepsis with septic shock: Secondary | ICD-10-CM | POA: Diagnosis not present

## 2022-02-17 DIAGNOSIS — J9621 Acute and chronic respiratory failure with hypoxia: Secondary | ICD-10-CM | POA: Diagnosis not present

## 2022-02-17 DIAGNOSIS — L89151 Pressure ulcer of sacral region, stage 1: Secondary | ICD-10-CM | POA: Diagnosis not present

## 2022-02-17 DIAGNOSIS — A419 Sepsis, unspecified organism: Secondary | ICD-10-CM | POA: Diagnosis not present

## 2022-02-17 LAB — TRIGLYCERIDES: Triglycerides: <10 mg/dL (ref <150)

## 2022-02-17 NOTE — Progress Notes (Addendum)
Pulmonary Critical Care Medicine [name]   PULMONARY CRITICAL CARE SERVICE  PROGRESS NOTE     [name]  HQI:[id number]  DOB: 22-Dec-1970   DOA: 01/08/2022  Referring Physician: Satira Sark, MD  HPI: Jerry [name] is a 51 y.o. male being followed for ventilator/airway/oxygen weaning Acute on Chronic Respiratory Failure.  Patient seen lying in bed, currently on pressure support 12/5 FiO2 30%.  Patient did 14 out of 16 hours pressure support yesterday before requesting to be placed back on full support.  We will continue with 16-hour pressure support as tolerated with 8 hours of full support at night.  Medications: Reviewed on Rounds  Physical Exam:  Vitals: -  Ventilator Settings pressure support 12/5 FiO2 30%  General: Comfortable at this time Neck: supple Cardiovascular: no malignant arrhythmias Respiratory: Bilaterally clear Skin: no rash seen on limited exam Musculoskeletal: No gross abnormality Psychiatric:unable to assess Neurologic:no involuntary movements         Lab Data:   Basic Metabolic Panel: Recent Labs  Lab 02/12/22 0501 02/14/22 0310 02/15/22 0756 02/16/22 0550  NA 136 140  --  139  K 3.5 3.9  --  3.9  CL 106 109  --  106  CO2 23 25  --  26  GLUCOSE 119* 115*  --  95  BUN 22* 18  --  19  CREATININE <0.30* <0.30*  --  <0.30*  CALCIUM 9.3 9.5  --  9.6  MG 1.8 1.8 1.9 1.8  PHOS 4.0  --  3.9  --     ABG: No results for input(s): "PHART", "PCO2ART", "PO2ART", "HCO3", "O2SAT" in the last 168 hours.  Liver Function Tests: No results for input(s): "AST", "ALT", "ALKPHOS", "BILITOT", "PROT", "ALBUMIN" in the last 168 hours. No results for input(s): "LIPASE", "AMYLASE" in the last 168 hours. No results for input(s): "AMMONIA" in the last 168 hours.  CBC: Recent Labs  Lab 02/12/22 0501 02/16/22 0550  WBC 4.9 7.3  HGB 8.2* 9.3*  HCT 25.9* 29.7*  MCV 92.8 92.0  PLT 214 184    Cardiac Enzymes: No results for  input(s): "CKTOTAL", "CKMB", "CKMBINDEX", "TROPONINI" in the last 168 hours.  BNP (last 3 results) No results for input(s): "BNP" in the last 8760 hours.  ProBNP (last 3 results) No results for input(s): "PROBNP" in the last 8760 hours.  Radiological Exams: DG Abd 1 View  Result Date: 02/16/2022 CLINICAL DATA:  51 year old male with ALS.  Ileus. EXAM: ABDOMEN - 1 VIEW COMPARISON:  02/14/2022 and earlier. FINDINGS: Portable AP supine view at 0645 hours. Gas-filled small and large bowel loops throughout the abdomen and pelvis do not appear significantly changed from 01/26/2022. Percutaneous gastrostomy tube is visible. Pelvic phleboliths again noted. Osteopenia. IMPRESSION: Ileus, not significantly changed from last month. Electronically Signed   By: Genevie Ann M.D.   On: 02/16/2022 07:32    Assessment/Plan Active Problems:   ALS (amyotrophic lateral sclerosis) (HCC)   Septic shock (HCC)   Pseudomonas pneumonia (HCC)   Autonomic dysfunction   Acute on chronic respiratory failure with hypoxia (HCC)   Acute on chronic respiratory failure with hypoxia-patient continues to be 16 hours pressure support in the day as tolerated with 8 hours of full support at night.  Has been requesting to be placed back on full support earlier than 8-hour start time.  Continue with supportive care. Septic shock -resolved ALS severe advanced disease-continue with supportive care. Pseudomonas pneumonia-no overall change.  Continue with supportive care. Autonomic dysfunction- patient  is at baseline.   I have personally seen and evaluated the patient, evaluated laboratory and imaging results, formulated the assessment and plan and placed orders. The Patient requires high complexity decision making with multiple systems involvement.  Rounds were done with the Respiratory Therapy Director and Staff therapists and discussed with nursing staff also.  Allyne Gee, MD [facility] Pulmonary Critical Care Medicine Sleep  Medicine

## 2022-02-18 DIAGNOSIS — J9621 Acute and chronic respiratory failure with hypoxia: Secondary | ICD-10-CM | POA: Diagnosis not present

## 2022-02-18 DIAGNOSIS — G909 Disorder of the autonomic nervous system, unspecified: Secondary | ICD-10-CM | POA: Diagnosis not present

## 2022-02-18 DIAGNOSIS — E119 Type 2 diabetes mellitus without complications: Secondary | ICD-10-CM | POA: Diagnosis not present

## 2022-02-18 DIAGNOSIS — G1221 Amyotrophic lateral sclerosis: Secondary | ICD-10-CM | POA: Diagnosis not present

## 2022-02-18 DIAGNOSIS — J151 Pneumonia due to Pseudomonas: Secondary | ICD-10-CM | POA: Diagnosis not present

## 2022-02-18 DIAGNOSIS — F419 Anxiety disorder, unspecified: Secondary | ICD-10-CM | POA: Diagnosis not present

## 2022-02-18 DIAGNOSIS — J9690 Respiratory failure, unspecified, unspecified whether with hypoxia or hypercapnia: Secondary | ICD-10-CM | POA: Diagnosis not present

## 2022-02-18 DIAGNOSIS — A419 Sepsis, unspecified organism: Secondary | ICD-10-CM | POA: Diagnosis not present

## 2022-02-18 DIAGNOSIS — R6521 Severe sepsis with septic shock: Secondary | ICD-10-CM | POA: Diagnosis not present

## 2022-02-18 LAB — COMPREHENSIVE METABOLIC PANEL
ALT: 36 U/L (ref 0–44)
AST: 19 U/L (ref 15–41)
Albumin: 3 g/dL — ABNORMAL LOW (ref 3.5–5.0)
Alkaline Phosphatase: 66 U/L (ref 38–126)
Anion gap: 10 (ref 5–15)
BUN: 16 mg/dL (ref 6–20)
CO2: 23 mmol/L (ref 22–32)
Calcium: 9.3 mg/dL (ref 8.9–10.3)
Chloride: 106 mmol/L (ref 98–111)
Creatinine, Ser: <0.3 mg/dL — ABNORMAL LOW (ref 0.61–1.24)
Glucose, Bld: 123 mg/dL — ABNORMAL HIGH (ref 70–99)
Potassium: 3.5 mmol/L (ref 3.5–5.1)
Sodium: 139 mmol/L (ref 135–145)
Total Bilirubin: 0.4 mg/dL (ref 0.3–1.2)
Total Protein: 6.5 g/dL (ref 6.5–8.1)

## 2022-02-18 LAB — CBC
HCT: 25.6 % — ABNORMAL LOW (ref 39.0–52.0)
Hemoglobin: 8.4 g/dL — ABNORMAL LOW (ref 13.0–17.0)
MCH: 29.7 pg (ref 26.0–34.0)
MCHC: 32.8 g/dL (ref 30.0–36.0)
MCV: 90.5 fL (ref 80.0–100.0)
Platelets: 175 10*3/uL (ref 150–400)
RBC: 2.83 MIL/uL — ABNORMAL LOW (ref 4.22–5.81)
RDW: 14 % (ref 11.5–15.5)
WBC: 6.9 10*3/uL (ref 4.0–10.5)
nRBC: 0 % (ref 0.0–0.2)

## 2022-02-18 LAB — PHOSPHORUS: Phosphorus: 4 mg/dL (ref 2.5–4.6)

## 2022-02-18 LAB — MAGNESIUM: Magnesium: 1.9 mg/dL (ref 1.7–2.4)

## 2022-02-18 NOTE — Progress Notes (Signed)
Pulmonary Critical Care Medicine [place]   PULMONARY CRITICAL CARE SERVICE  PROGRESS NOTE     Jerry [name]  JSH:[id number]  DOB: April 10, 1971   DOA: 01/08/2022  Referring Physician: Satira Sark, MD  HPI: Jerry [name] is a 51 y.o. male being followed for ventilator/airway/oxygen weaning Acute on Chronic Respiratory Failure.  Patient remains on the ventilator consistently failing weaning attempts  Medications: Reviewed on Rounds  Physical Exam:  Vitals: Temperature 96.4 pulse 61 respiratory 20 blood pressure was 98/70 saturations 98%  Ventilator Settings pressure support FiO2 30% pressure 12/5  General: Comfortable at this time Neck: supple Cardiovascular: no malignant arrhythmias Respiratory: Scattered rhonchi expansion is equal Skin: no rash seen on limited exam Musculoskeletal: No gross abnormality Psychiatric:unable to assess Neurologic:no involuntary movements         Lab Data:   Basic Metabolic Panel: Recent Labs  Lab 02/12/22 0501 02/14/22 0310 02/15/22 0756 02/16/22 0550 02/18/22 0509  NA 136 140  --  139 139  K 3.5 3.9  --  3.9 3.5  CL 106 109  --  106 106  CO2 23 25  --  26 23  GLUCOSE 119* 115*  --  95 123*  BUN 22* 18  --  19 16  CREATININE <0.30* <0.30*  --  <0.30* <0.30*  CALCIUM 9.3 9.5  --  9.6 9.3  MG 1.8 1.8 1.9 1.8 1.9  PHOS 4.0  --  3.9  --  4.0    ABG: No results for input(s): "PHART", "PCO2ART", "PO2ART", "HCO3", "O2SAT" in the last 168 hours.  Liver Function Tests: Recent Labs  Lab 02/18/22 0509  AST 19  ALT 36  ALKPHOS 66  BILITOT 0.4  PROT 6.5  ALBUMIN 3.0*   No results for input(s): "LIPASE", "AMYLASE" in the last 168 hours. No results for input(s): "AMMONIA" in the last 168 hours.  CBC: Recent Labs  Lab 02/12/22 0501 02/16/22 0550 02/18/22 0509  WBC 4.9 7.3 6.9  HGB 8.2* 9.3* 8.4*  HCT 25.9* 29.7* 25.6*  MCV 92.8 92.0 90.5  PLT 214 184 175    Cardiac Enzymes: No results for  input(s): "CKTOTAL", "CKMB", "CKMBINDEX", "TROPONINI" in the last 168 hours.  BNP (last 3 results) No results for input(s): "BNP" in the last 8760 hours.  ProBNP (last 3 results) No results for input(s): "PROBNP" in the last 8760 hours.  Radiological Exams: No results found.  Assessment/Plan Active Problems:   ALS (amyotrophic lateral sclerosis) (HCC)   Septic shock (HCC)   Pseudomonas pneumonia (HCC)   Autonomic dysfunction   Acute on chronic respiratory failure with hypoxia (HCC)   Acute on chronic respiratory failure with hypoxia since the patient has been continually failing attempts at weaning he is deemed his baseline on the ventilator Sepsis resolved Pseudomonas pneumonia treated improved Autonomic dysfunction supportive care ALS advanced severe end-stage disease is vent dependent   I have personally seen and evaluated the patient, evaluated laboratory and imaging results, formulated the assessment and plan and placed orders. The Patient requires high complexity decision making with multiple systems involvement.  Rounds were done with the Respiratory Therapy Director and Staff therapists and discussed with nursing staff also.  Allyne Gee, MD [facility] Pulmonary Critical Care Medicine Sleep Medicine

## 2022-02-19 DIAGNOSIS — J9690 Respiratory failure, unspecified, unspecified whether with hypoxia or hypercapnia: Secondary | ICD-10-CM | POA: Diagnosis not present

## 2022-02-19 DIAGNOSIS — R6521 Severe sepsis with septic shock: Secondary | ICD-10-CM | POA: Diagnosis not present

## 2022-02-19 DIAGNOSIS — G1221 Amyotrophic lateral sclerosis: Secondary | ICD-10-CM | POA: Diagnosis not present

## 2022-02-19 DIAGNOSIS — J151 Pneumonia due to Pseudomonas: Secondary | ICD-10-CM | POA: Diagnosis not present

## 2022-02-19 DIAGNOSIS — E119 Type 2 diabetes mellitus without complications: Secondary | ICD-10-CM | POA: Diagnosis not present

## 2022-02-19 DIAGNOSIS — A419 Sepsis, unspecified organism: Secondary | ICD-10-CM | POA: Diagnosis not present

## 2022-02-19 DIAGNOSIS — F419 Anxiety disorder, unspecified: Secondary | ICD-10-CM | POA: Diagnosis not present

## 2022-02-19 DIAGNOSIS — J9621 Acute and chronic respiratory failure with hypoxia: Secondary | ICD-10-CM | POA: Diagnosis not present

## 2022-02-19 DIAGNOSIS — G909 Disorder of the autonomic nervous system, unspecified: Secondary | ICD-10-CM | POA: Diagnosis not present

## 2022-02-19 NOTE — Progress Notes (Signed)
Pulmonary Critical Care Medicine [place]   PULMONARY CRITICAL CARE SERVICE  PROGRESS NOTE     Jerry [name]  FOY:[id number]  DOB: 06-26-1971   DOA: 01/08/2022  Referring Physician: Satira Sark, MD  HPI: Jerry [name] is a 51 y.o. male being followed for ventilator/airway/oxygen weaning Acute on Chronic Respiratory Failure. ***  Medications: Reviewed on Rounds  Physical Exam:  Vitals: ***  Ventilator Settings ***  General: Comfortable at this time Neck: supple Cardiovascular: no malignant arrhythmias Respiratory: *** Skin: no rash seen on limited exam Musculoskeletal: No gross abnormality Psychiatric:unable to assess Neurologic:no involuntary movements         Lab Data:   Basic Metabolic Panel: Recent Labs  Lab 02/14/22 0310 02/15/22 0756 02/16/22 0550 02/18/22 0509  NA 140  --  139 139  K 3.9  --  3.9 3.5  CL 109  --  106 106  CO2 25  --  26 23  GLUCOSE 115*  --  95 123*  BUN 18  --  19 16  CREATININE <0.30*  --  <0.30* <0.30*  CALCIUM 9.5  --  9.6 9.3  MG 1.8 1.9 1.8 1.9  PHOS  --  3.9  --  4.0    ABG: No results for input(s): "PHART", "PCO2ART", "PO2ART", "HCO3", "O2SAT" in the last 168 hours.  Liver Function Tests: Recent Labs  Lab 02/18/22 0509  AST 19  ALT 36  ALKPHOS 66  BILITOT 0.4  PROT 6.5  ALBUMIN 3.0*   No results for input(s): "LIPASE", "AMYLASE" in the last 168 hours. No results for input(s): "AMMONIA" in the last 168 hours.  CBC: Recent Labs  Lab 02/16/22 0550 02/18/22 0509  WBC 7.3 6.9  HGB 9.3* 8.4*  HCT 29.7* 25.6*  MCV 92.0 90.5  PLT 184 175    Cardiac Enzymes: No results for input(s): "CKTOTAL", "CKMB", "CKMBINDEX", "TROPONINI" in the last 168 hours.  BNP (last 3 results) No results for input(s): "BNP" in the last 8760 hours.  ProBNP (last 3 results) No results for input(s): "PROBNP" in the last 8760 hours.  Radiological Exams: No results found.  Assessment/Plan Active  Problems:   ALS (amyotrophic lateral sclerosis) (HCC)   Septic shock (HCC)   Pseudomonas pneumonia (HCC)   Autonomic dysfunction   Acute on chronic respiratory failure with hypoxia (HCC)   *** *** *** *** ***   I have personally seen and evaluated the patient, evaluated laboratory and imaging results, formulated the assessment and plan and placed orders. The Patient requires high complexity decision making with multiple systems involvement.  Rounds were done with the Respiratory Therapy Director and Staff therapists and discussed with nursing staff also.  Allyne Gee, MD [facility] Pulmonary Critical Care Medicine Sleep Medicine

## 2022-02-20 ENCOUNTER — Other Ambulatory Visit (HOSPITAL_COMMUNITY): Payer: Self-pay

## 2022-02-20 DIAGNOSIS — J9621 Acute and chronic respiratory failure with hypoxia: Secondary | ICD-10-CM | POA: Diagnosis not present

## 2022-02-20 DIAGNOSIS — E119 Type 2 diabetes mellitus without complications: Secondary | ICD-10-CM | POA: Diagnosis not present

## 2022-02-20 DIAGNOSIS — A419 Sepsis, unspecified organism: Secondary | ICD-10-CM | POA: Diagnosis not present

## 2022-02-20 DIAGNOSIS — F419 Anxiety disorder, unspecified: Secondary | ICD-10-CM | POA: Diagnosis not present

## 2022-02-20 DIAGNOSIS — G909 Disorder of the autonomic nervous system, unspecified: Secondary | ICD-10-CM | POA: Diagnosis not present

## 2022-02-20 DIAGNOSIS — K567 Ileus, unspecified: Secondary | ICD-10-CM | POA: Diagnosis not present

## 2022-02-20 DIAGNOSIS — Z931 Gastrostomy status: Secondary | ICD-10-CM | POA: Diagnosis not present

## 2022-02-20 DIAGNOSIS — J9690 Respiratory failure, unspecified, unspecified whether with hypoxia or hypercapnia: Secondary | ICD-10-CM | POA: Diagnosis not present

## 2022-02-20 DIAGNOSIS — G1221 Amyotrophic lateral sclerosis: Secondary | ICD-10-CM | POA: Diagnosis not present

## 2022-02-20 DIAGNOSIS — J151 Pneumonia due to Pseudomonas: Secondary | ICD-10-CM | POA: Diagnosis not present

## 2022-02-20 DIAGNOSIS — R6521 Severe sepsis with septic shock: Secondary | ICD-10-CM | POA: Diagnosis not present

## 2022-02-21 DIAGNOSIS — A419 Sepsis, unspecified organism: Secondary | ICD-10-CM | POA: Diagnosis not present

## 2022-02-21 DIAGNOSIS — G909 Disorder of the autonomic nervous system, unspecified: Secondary | ICD-10-CM | POA: Diagnosis not present

## 2022-02-21 DIAGNOSIS — R6521 Severe sepsis with septic shock: Secondary | ICD-10-CM | POA: Diagnosis not present

## 2022-02-21 DIAGNOSIS — J151 Pneumonia due to Pseudomonas: Secondary | ICD-10-CM | POA: Diagnosis not present

## 2022-02-21 DIAGNOSIS — G1221 Amyotrophic lateral sclerosis: Secondary | ICD-10-CM | POA: Diagnosis not present

## 2022-02-21 DIAGNOSIS — J9621 Acute and chronic respiratory failure with hypoxia: Secondary | ICD-10-CM | POA: Diagnosis not present

## 2022-02-21 LAB — PHOSPHORUS: Phosphorus: 3.7 mg/dL (ref 2.5–4.6)

## 2022-02-21 LAB — MAGNESIUM: Magnesium: 1.9 mg/dL (ref 1.7–2.4)

## 2022-02-21 NOTE — Progress Notes (Signed)
Pulmonary Critical Care Medicine [place]   PULMONARY CRITICAL CARE SERVICE  PROGRESS NOTE     Cote Mayabb  DJS:[id number]  DOB: February 25, 1971   DOA: 01/08/2022  Referring Physician: Satira Sark, MD  HPI: Jerry [name] is a 51 y.o. male being followed for ventilator/airway/oxygen weaning Acute on Chronic Respiratory Failure.  Patient is on the ventilator baseline on assist control full support  Medications: Reviewed on Rounds  Physical Exam:  Vitals: Temperature is 97.8 pulse 67 respiratory 22 blood pressure is 108/92 saturations 100%  Ventilator Settings assist control FiO2 30% tidal volume 500 PEEP  General: Comfortable at this time Neck: supple Cardiovascular: no malignant arrhythmias Respiratory: No rhonchi no rales Skin: no rash seen on limited exam Musculoskeletal: No gross abnormality Psychiatric:unable to assess Neurologic:no involuntary movements         Lab Data:   Basic Metabolic Panel: Recent Labs  Lab 02/15/22 0756 02/16/22 0550 02/18/22 0509 02/21/22 0609  NA  --  139 139  --   K  --  3.9 3.5  --   CL  --  106 106  --   CO2  --  26 23  --   GLUCOSE  --  95 123*  --   BUN  --  19 16  --   CREATININE  --  <0.30* <0.30*  --   CALCIUM  --  9.6 9.3  --   MG 1.9 1.8 1.9 1.9  PHOS 3.9  --  4.0 3.7    ABG: No results for input(s): "PHART", "PCO2ART", "PO2ART", "HCO3", "O2SAT" in the last 168 hours.  Liver Function Tests: Recent Labs  Lab 02/18/22 0509  AST 19  ALT 36  ALKPHOS 66  BILITOT 0.4  PROT 6.5  ALBUMIN 3.0*   No results for input(s): "LIPASE", "AMYLASE" in the last 168 hours. No results for input(s): "AMMONIA" in the last 168 hours.  CBC: Recent Labs  Lab 02/16/22 0550 02/18/22 0509  WBC 7.3 6.9  HGB 9.3* 8.4*  HCT 29.7* 25.6*  MCV 92.0 90.5  PLT 184 175    Cardiac Enzymes: No results for input(s): "CKTOTAL", "CKMB", "CKMBINDEX", "TROPONINI" in the last 168 hours.  BNP (last 3 results) No  results for input(s): "BNP" in the last 8760 hours.  ProBNP (last 3 results) No results for input(s): "PROBNP" in the last 8760 hours.  Radiological Exams: DG Abd 1 View  Result Date: 02/20/2022 CLINICAL DATA:  51 year old male with ALS.  Ileus. EXAM: ABDOMEN - 1 VIEW COMPARISON:  02/16/2022 and earlier. FINDINGS: Portable AP supine view at 0701 hours. Right lung bases clear. Elevated left hemidiaphragm with patchy left lung base opacity appears stable since 02/09/2022, but also very similar to CT Abdomen and Pelvis 12/09/2021. Unchanged gastrostomy tube and diffuse gas-filled although nondilated bowel loops throughout the abdomen and pelvis. No acute osseous abnormality identified. IMPRESSION: 1. Unchanged ileus.  Gastrostomy tube remains in place. 2. Chronic elevated hemidiaphragm and left lung base hypoventilation. Electronically Signed   By: Genevie Ann M.D.   On: 02/20/2022 08:53    Assessment/Plan Active Problems:   ALS (amyotrophic lateral sclerosis) (HCC)   Septic shock (HCC)   Pseudomonas pneumonia (HCC)   Autonomic dysfunction   Acute on chronic respiratory failure with hypoxia (HCC)   Acute on chronic respiratory failure hypoxia we will continue with full support on assist control mode patient is currently on 30% FiO2 saturations are 98% Severe sepsis and shock no change we will continue to follow along  hemodynamics are stable Pseudomonas pneumonia has been treated slowly improving Autonomic dysfunction supportive care ALS advanced severe disease   I have personally seen and evaluated the patient, evaluated laboratory and imaging results, formulated the assessment and plan and placed orders. The Patient requires high complexity decision making with multiple systems involvement.  Rounds were done with the Respiratory Therapy Director and Staff therapists and discussed with nursing staff also.  Allyne Gee, MD [facility] Pulmonary Critical Care Medicine Sleep Medicine

## 2022-02-21 NOTE — Progress Notes (Addendum)
Pulmonary Critical Care Medicine [place]   PULMONARY CRITICAL CARE SERVICE  PROGRESS NOTE     Jerry [name]  GYI:[id number]  DOB: 03/25/1971   DOA: 01/08/2022  Referring Physician: Satira Sark, MD  HPI: Jerry [name] is a 51 y.o. male being followed for ventilator/airway/oxygen weaning Acute on Chronic Respiratory Failure.  Patient seen lying in bed, currently remains on full support.  Not weaning today due to being tired and requesting full support.  Medications: Reviewed on Rounds  Physical Exam:  Vitals: Temp 98.1, pulse 63, respirations 17, BP 96/55, SPO2 98%  Ventilator Settings AC VC, FiO2 30%, tidal volume 500, rate 16, PEEP 5  General: Comfortable at this time Neck: supple Cardiovascular: no malignant arrhythmias Respiratory: Bilaterally clear Skin: no rash seen on limited exam Musculoskeletal: No gross abnormality Psychiatric:unable to assess Neurologic:no involuntary movements         Lab Data:   Basic Metabolic Panel: Recent Labs  Lab 02/15/22 0756 02/16/22 0550 02/18/22 0509  NA  --  139 139  K  --  3.9 3.5  CL  --  106 106  CO2  --  26 23  GLUCOSE  --  95 123*  BUN  --  19 16  CREATININE  --  <0.30* <0.30*  CALCIUM  --  9.6 9.3  MG 1.9 1.8 1.9  PHOS 3.9  --  4.0    ABG: No results for input(s): "PHART", "PCO2ART", "PO2ART", "HCO3", "O2SAT" in the last 168 hours.  Liver Function Tests: Recent Labs  Lab 02/18/22 0509  AST 19  ALT 36  ALKPHOS 66  BILITOT 0.4  PROT 6.5  ALBUMIN 3.0*   No results for input(s): "LIPASE", "AMYLASE" in the last 168 hours. No results for input(s): "AMMONIA" in the last 168 hours.  CBC: Recent Labs  Lab 02/16/22 0550 02/18/22 0509  WBC 7.3 6.9  HGB 9.3* 8.4*  HCT 29.7* 25.6*  MCV 92.0 90.5  PLT 184 175    Cardiac Enzymes: No results for input(s): "CKTOTAL", "CKMB", "CKMBINDEX", "TROPONINI" in the last 168 hours.  BNP (last 3 results) No results for input(s): "BNP" in  the last 8760 hours.  ProBNP (last 3 results) No results for input(s): "PROBNP" in the last 8760 hours.  Radiological Exams: DG Abd 1 View  Result Date: 02/20/2022 CLINICAL DATA:  51 year old male with ALS.  Ileus. EXAM: ABDOMEN - 1 VIEW COMPARISON:  02/16/2022 and earlier. FINDINGS: Portable AP supine view at 0701 hours. Right lung bases clear. Elevated left hemidiaphragm with patchy left lung base opacity appears stable since 02/09/2022, but also very similar to CT Abdomen and Pelvis 12/09/2021. Unchanged gastrostomy tube and diffuse gas-filled although nondilated bowel loops throughout the abdomen and pelvis. No acute osseous abnormality identified. IMPRESSION: 1. Unchanged ileus.  Gastrostomy tube remains in place. 2. Chronic elevated hemidiaphragm and left lung base hypoventilation. Electronically Signed   By: Genevie Ann M.D.   On: 02/20/2022 08:53    Assessment/Plan Active Problems:   ALS (amyotrophic lateral sclerosis) (HCC)   Septic shock (HCC)   Pseudomonas pneumonia (HCC)   Autonomic dysfunction   Acute on chronic respiratory failure with hypoxia (HCC)   Acute on chronic respiratory failure with hypoxia-patient currently on full support not weaning today due to request.  He has frequently been declining the suggested 16 hours of pressure support with 8 hours of full support at night.  We will continue to encourage him to work with current recommendations as listed above.  Continue with  supportive care. Status post tracheostomy-remains in place and is stable.  Continue with trach care call. Septic shock -resolved ALS severe advanced disease-continue with supportive care. Pseudomonas pneumonia-no overall change.  Continue with supportive care.   I have personally seen and evaluated the patient, evaluated laboratory and imaging results, formulated the assessment and plan and placed orders. The Patient requires high complexity decision making with multiple systems involvement.  Rounds  were done with the Respiratory Therapy Director and Staff therapists and discussed with nursing staff also.  Allyne Gee, MD [facility] Pulmonary Critical Care Medicine Sleep Medicine

## 2022-02-22 DIAGNOSIS — F419 Anxiety disorder, unspecified: Secondary | ICD-10-CM | POA: Diagnosis not present

## 2022-02-22 DIAGNOSIS — A419 Sepsis, unspecified organism: Secondary | ICD-10-CM | POA: Diagnosis not present

## 2022-02-22 DIAGNOSIS — L89151 Pressure ulcer of sacral region, stage 1: Secondary | ICD-10-CM | POA: Diagnosis not present

## 2022-02-22 DIAGNOSIS — J9620 Acute and chronic respiratory failure, unspecified whether with hypoxia or hypercapnia: Secondary | ICD-10-CM | POA: Diagnosis not present

## 2022-02-22 DIAGNOSIS — G909 Disorder of the autonomic nervous system, unspecified: Secondary | ICD-10-CM | POA: Diagnosis not present

## 2022-02-22 DIAGNOSIS — E119 Type 2 diabetes mellitus without complications: Secondary | ICD-10-CM | POA: Diagnosis not present

## 2022-02-22 DIAGNOSIS — J9621 Acute and chronic respiratory failure with hypoxia: Secondary | ICD-10-CM | POA: Diagnosis not present

## 2022-02-22 DIAGNOSIS — J151 Pneumonia due to Pseudomonas: Secondary | ICD-10-CM | POA: Diagnosis not present

## 2022-02-22 DIAGNOSIS — G1221 Amyotrophic lateral sclerosis: Secondary | ICD-10-CM | POA: Diagnosis not present

## 2022-02-22 DIAGNOSIS — R6521 Severe sepsis with septic shock: Secondary | ICD-10-CM | POA: Diagnosis not present

## 2022-02-22 LAB — COMPREHENSIVE METABOLIC PANEL
ALT: 30 U/L (ref 0–44)
AST: 21 U/L (ref 15–41)
Albumin: 2.7 g/dL — ABNORMAL LOW (ref 3.5–5.0)
Alkaline Phosphatase: 66 U/L (ref 38–126)
Anion gap: 9 (ref 5–15)
BUN: 14 mg/dL (ref 6–20)
CO2: 22 mmol/L (ref 22–32)
Calcium: 9 mg/dL (ref 8.9–10.3)
Chloride: 105 mmol/L (ref 98–111)
Creatinine, Ser: <0.3 mg/dL — ABNORMAL LOW (ref 0.61–1.24)
Glucose, Bld: 131 mg/dL — ABNORMAL HIGH (ref 70–99)
Potassium: 3.9 mmol/L (ref 3.5–5.1)
Sodium: 136 mmol/L (ref 135–145)
Total Bilirubin: 0.5 mg/dL (ref 0.3–1.2)
Total Protein: 6.3 g/dL — ABNORMAL LOW (ref 6.5–8.1)

## 2022-02-22 LAB — CBC WITH DIFFERENTIAL/PLATELET
Abs Immature Granulocytes: 0.02 10*3/uL (ref 0.00–0.07)
Basophils Absolute: 0 10*3/uL (ref 0.0–0.1)
Basophils Relative: 1 %
Eosinophils Absolute: 0.2 10*3/uL (ref 0.0–0.5)
Eosinophils Relative: 2 %
HCT: 25.6 % — ABNORMAL LOW (ref 39.0–52.0)
Hemoglobin: 8.3 g/dL — ABNORMAL LOW (ref 13.0–17.0)
Immature Granulocytes: 0 %
Lymphocytes Relative: 18 %
Lymphs Abs: 1.1 10*3/uL (ref 0.7–4.0)
MCH: 28.5 pg (ref 26.0–34.0)
MCHC: 32.4 g/dL (ref 30.0–36.0)
MCV: 88 fL (ref 80.0–100.0)
Monocytes Absolute: 0.6 10*3/uL (ref 0.1–1.0)
Monocytes Relative: 9 %
Neutro Abs: 4.4 10*3/uL (ref 1.7–7.7)
Neutrophils Relative %: 70 %
Platelets: UNDETERMINED 10*3/uL (ref 150–400)
RBC: 2.91 MIL/uL — ABNORMAL LOW (ref 4.22–5.81)
RDW: 14.3 % (ref 11.5–15.5)
WBC: 6.3 10*3/uL (ref 4.0–10.5)
nRBC: 0 % (ref 0.0–0.2)

## 2022-02-22 LAB — MAGNESIUM: Magnesium: 1.8 mg/dL (ref 1.7–2.4)

## 2022-02-22 NOTE — Progress Notes (Signed)
Pulmonary Critical Care Medicine [place]   PULMONARY CRITICAL CARE SERVICE  PROGRESS NOTE     Jerry [name]  WNU:[id number]  DOB: October 01, 1970   DOA: 01/08/2022  Referring Physician: Satira Sark, MD  HPI: Jerry [name] is a 51 y.o. male being followed for ventilator/airway/oxygen weaning Acute on Chronic Respiratory Failure.  Patient currently is on assist control mode has been on 30% FiO2 with good saturations  Medications: Reviewed on Rounds  Physical Exam:  Vitals: Temperature is 97.0 pulse of 58 respiratory rate is 19 blood pressure 97/51 saturations 100%  Ventilator Settings on assist control FiO2 is 30% tidal volume 500 PEEP 5  General: Comfortable at this time Neck: supple Cardiovascular: no malignant arrhythmias Respiratory: No rhonchi no rales are noted at this time Skin: no rash seen on limited exam Musculoskeletal: No gross abnormality Psychiatric:unable to assess Neurologic:no involuntary movements         Lab Data:   Basic Metabolic Panel: Recent Labs  Lab 02/16/22 0550 02/18/22 0509 02/21/22 0609  NA 139 139  --   K 3.9 3.5  --   CL 106 106  --   CO2 26 23  --   GLUCOSE 95 123*  --   BUN 19 16  --   CREATININE <0.30* <0.30*  --   CALCIUM 9.6 9.3  --   MG 1.8 1.9 1.9  PHOS  --  4.0 3.7    ABG: No results for input(s): "PHART", "PCO2ART", "PO2ART", "HCO3", "O2SAT" in the last 168 hours.  Liver Function Tests: Recent Labs  Lab 02/18/22 0509  AST 19  ALT 36  ALKPHOS 66  BILITOT 0.4  PROT 6.5  ALBUMIN 3.0*   No results for input(s): "LIPASE", "AMYLASE" in the last 168 hours. No results for input(s): "AMMONIA" in the last 168 hours.  CBC: Recent Labs  Lab 02/16/22 0550 02/18/22 0509  WBC 7.3 6.9  HGB 9.3* 8.4*  HCT 29.7* 25.6*  MCV 92.0 90.5  PLT 184 175    Cardiac Enzymes: No results for input(s): "CKTOTAL", "CKMB", "CKMBINDEX", "TROPONINI" in the last 168 hours.  BNP (last 3 results) No results for  input(s): "BNP" in the last 8760 hours.  ProBNP (last 3 results) No results for input(s): "PROBNP" in the last 8760 hours.  Radiological Exams: No results found.  Assessment/Plan Active Problems:   ALS (amyotrophic lateral sclerosis) (HCC)   Septic shock (HCC)   Pseudomonas pneumonia (HCC)   Autonomic dysfunction   Acute on chronic respiratory failure with hypoxia (HCC)   Acute on chronic respiratory failure hypoxia on full support on the ventilator and volume cycled doing better we will continue to follow along awaiting discharge planning Sepsis with shock resolved hemodynamics are stable ALS advanced severe disease Autonomic dysfunction has been under control Pseudomonas pneumonia has been treated clinically improved   I have personally seen and evaluated the patient, evaluated laboratory and imaging results, formulated the assessment and plan and placed orders. The Patient requires high complexity decision making with multiple systems involvement.  Rounds were done with the Respiratory Therapy Director and Staff therapists and discussed with nursing staff also.  Allyne Gee, MD [facility] Pulmonary Critical Care Medicine Sleep Medicine

## 2022-02-23 ENCOUNTER — Other Ambulatory Visit (HOSPITAL_COMMUNITY): Payer: Self-pay

## 2022-02-23 DIAGNOSIS — K567 Ileus, unspecified: Secondary | ICD-10-CM | POA: Diagnosis not present

## 2022-02-23 DIAGNOSIS — J9621 Acute and chronic respiratory failure with hypoxia: Secondary | ICD-10-CM | POA: Diagnosis not present

## 2022-02-23 DIAGNOSIS — A419 Sepsis, unspecified organism: Secondary | ICD-10-CM | POA: Diagnosis not present

## 2022-02-23 DIAGNOSIS — F419 Anxiety disorder, unspecified: Secondary | ICD-10-CM | POA: Diagnosis not present

## 2022-02-23 DIAGNOSIS — L89151 Pressure ulcer of sacral region, stage 1: Secondary | ICD-10-CM | POA: Diagnosis not present

## 2022-02-23 DIAGNOSIS — G909 Disorder of the autonomic nervous system, unspecified: Secondary | ICD-10-CM | POA: Diagnosis not present

## 2022-02-23 DIAGNOSIS — J9620 Acute and chronic respiratory failure, unspecified whether with hypoxia or hypercapnia: Secondary | ICD-10-CM | POA: Diagnosis not present

## 2022-02-23 DIAGNOSIS — G1221 Amyotrophic lateral sclerosis: Secondary | ICD-10-CM | POA: Diagnosis not present

## 2022-02-23 DIAGNOSIS — R6521 Severe sepsis with septic shock: Secondary | ICD-10-CM | POA: Diagnosis not present

## 2022-02-23 DIAGNOSIS — E119 Type 2 diabetes mellitus without complications: Secondary | ICD-10-CM | POA: Diagnosis not present

## 2022-02-23 DIAGNOSIS — K6389 Other specified diseases of intestine: Secondary | ICD-10-CM | POA: Diagnosis not present

## 2022-02-23 DIAGNOSIS — J151 Pneumonia due to Pseudomonas: Secondary | ICD-10-CM | POA: Diagnosis not present

## 2022-02-23 LAB — BASIC METABOLIC PANEL
Anion gap: 8 (ref 5–15)
BUN: 13 mg/dL (ref 6–20)
CO2: 21 mmol/L — ABNORMAL LOW (ref 22–32)
Calcium: 9 mg/dL (ref 8.9–10.3)
Chloride: 109 mmol/L (ref 98–111)
Creatinine, Ser: <0.3 mg/dL — ABNORMAL LOW (ref 0.61–1.24)
Glucose, Bld: 86 mg/dL (ref 70–99)
Potassium: 3.6 mmol/L (ref 3.5–5.1)
Sodium: 138 mmol/L (ref 135–145)

## 2022-02-23 LAB — MAGNESIUM: Magnesium: 1.9 mg/dL (ref 1.7–2.4)

## 2022-02-23 LAB — PHOSPHORUS: Phosphorus: 3.6 mg/dL (ref 2.5–4.6)

## 2022-02-23 NOTE — Progress Notes (Signed)
Pulmonary Critical Care Medicine [place]   PULMONARY CRITICAL CARE SERVICE  PROGRESS NOTE     Jerry [name]  WIO:[id number]  DOB: 1970-11-27   DOA: 01/08/2022  Referring Physician: Satira Sark, MD  HPI: Jerry [name] is a 51 y.o. male being followed for ventilator/airway/oxygen weaning Acute on Chronic Respiratory Failure.  Patient is currently on assist control mode has been on 30% FiO2 failure to wean continues to require full support  Medications: Reviewed on Rounds  Physical Exam:  Vitals: Temperature is 98.1 pulse 83 respiratory 20 blood pressure is 115/72 saturations 100%  Ventilator Settings on assist control FiO2 is 30% tidal volume 500 PEEP 5  General: Comfortable at this time Neck: supple Cardiovascular: no malignant arrhythmias Respiratory: Scattered rhonchi expansion is equal Skin: no rash seen on limited exam Musculoskeletal: No gross abnormality Psychiatric:unable to assess Neurologic:no involuntary movements         Lab Data:   Basic Metabolic Panel: Recent Labs  Lab 02/18/22 0509 02/21/22 0609 02/22/22 0919 02/23/22 0517  NA 139  --  136 138  K 3.5  --  3.9 3.6  CL 106  --  105 109  CO2 23  --  22 21*  GLUCOSE 123*  --  131* 86  BUN 16  --  14 13  CREATININE <0.30*  --  <0.30* <0.30*  CALCIUM 9.3  --  9.0 9.0  MG 1.9 1.9 1.8 1.9  PHOS 4.0 3.7  --  3.6    ABG: No results for input(s): "PHART", "PCO2ART", "PO2ART", "HCO3", "O2SAT" in the last 168 hours.  Liver Function Tests: Recent Labs  Lab 02/18/22 0509 02/22/22 0919  AST 19 21  ALT 36 30  ALKPHOS 66 66  BILITOT 0.4 0.5  PROT 6.5 6.3*  ALBUMIN 3.0* 2.7*   No results for input(s): "LIPASE", "AMYLASE" in the last 168 hours. No results for input(s): "AMMONIA" in the last 168 hours.  CBC: Recent Labs  Lab 02/18/22 0509 02/22/22 0919  WBC 6.9 6.3  NEUTROABS  --  4.4  HGB 8.4* 8.3*  HCT 25.6* 25.6*  MCV 90.5 88.0  PLT 175 PLATELET CLUMPS NOTED ON  SMEAR, UNABLE TO ESTIMATE    Cardiac Enzymes: No results for input(s): "CKTOTAL", "CKMB", "CKMBINDEX", "TROPONINI" in the last 168 hours.  BNP (last 3 results) No results for input(s): "BNP" in the last 8760 hours.  ProBNP (last 3 results) No results for input(s): "PROBNP" in the last 8760 hours.  Radiological Exams: DG Abd Portable 1V  Result Date: 02/23/2022 CLINICAL DATA:  51 year old male with history of ileus. EXAM: PORTABLE ABDOMEN - 1 VIEW COMPARISON:  Abdominal radiograph 02/20/2022. FINDINGS: Diffuse gaseous distention noted throughout the small bowel and colon. Paucity of distal rectal gas. Percutaneous gastrostomy tube with retention balloon projecting over the left upper quadrant of the abdomen. No definite pneumoperitoneum confidently identified. IMPRESSION: 1. Abnormal bowel-gas pattern, likely to represent an ileus. There is notably, however, a paucity of distal rectal gas. The possibility of distal colonic obstruction is not excluded, but not strongly favored based on comparison with prior radiographs. 2. No pneumoperitoneum. Electronically Signed   By: Vinnie Langton M.D.   On: 02/23/2022 06:32    Assessment/Plan Active Problems:   ALS (amyotrophic lateral sclerosis) (HCC)   Septic shock (HCC)   Pseudomonas pneumonia (HCC)   Autonomic dysfunction   Acute on chronic respiratory failure with hypoxia (HCC)   Acute on chronic respiratory failure hypoxia we will continue with full support on the  ventilator patient is not a weanable candidate ALS advanced severe disease Cellulitis monitor no change we will continue to follow along Autonomic dysfunction supportive care Sepsis resolved   I have personally seen and evaluated the patient, evaluated laboratory and imaging results, formulated the assessment and plan and placed orders. The Patient requires high complexity decision making with multiple systems involvement.  Rounds were done with the Respiratory Therapy  Director and Staff therapists and discussed with nursing staff also.  Allyne Gee, MD [facility] Pulmonary Critical Care Medicine Sleep Medicine

## 2022-02-24 ENCOUNTER — Ambulatory Visit: Payer: Self-pay

## 2022-02-24 DIAGNOSIS — A419 Sepsis, unspecified organism: Secondary | ICD-10-CM | POA: Diagnosis not present

## 2022-02-24 DIAGNOSIS — G1221 Amyotrophic lateral sclerosis: Secondary | ICD-10-CM | POA: Diagnosis not present

## 2022-02-24 DIAGNOSIS — J151 Pneumonia due to Pseudomonas: Secondary | ICD-10-CM | POA: Diagnosis not present

## 2022-02-24 DIAGNOSIS — G909 Disorder of the autonomic nervous system, unspecified: Secondary | ICD-10-CM | POA: Diagnosis not present

## 2022-02-24 DIAGNOSIS — R6521 Severe sepsis with septic shock: Secondary | ICD-10-CM | POA: Diagnosis not present

## 2022-02-24 DIAGNOSIS — J9621 Acute and chronic respiratory failure with hypoxia: Secondary | ICD-10-CM | POA: Diagnosis not present

## 2022-02-24 LAB — MAGNESIUM: Magnesium: 1.7 mg/dL (ref 1.7–2.4)

## 2022-02-24 LAB — PHOSPHORUS: Phosphorus: 3.9 mg/dL (ref 2.5–4.6)

## 2022-02-24 LAB — TRIGLYCERIDES: Triglycerides: 11 mg/dL (ref <150)

## 2022-02-24 NOTE — Progress Notes (Signed)
Pulmonary Critical Care Medicine [place]   PULMONARY CRITICAL CARE SERVICE  PROGRESS NOTE     Kaedan Richert  WCB:[id number]  DOB: October 24, 1970   DOA: 01/08/2022  Referring Physician: Satira Sark, MD  HPI: Webb Weed is a 51 y.o. male being followed for ventilator/airway/oxygen weaning Acute on Chronic Respiratory Failure.  Patient is on assist control currently 28% FiO2 saturations are good  Medications: Reviewed on Rounds  Physical Exam:  Vitals: Temperature is 97.1 pulse 65 respiratory rate 16 blood pressure 95/58 saturations 99%  Ventilator Settings on assist control FiO2 is 28% tidal volume 500 PEEP 5  General: Comfortable at this time Neck: supple Cardiovascular: no malignant arrhythmias Respiratory: Scattered rhonchi expansion is equal Skin: no rash seen on limited exam Musculoskeletal: No gross abnormality Psychiatric:unable to assess Neurologic:no involuntary movements         Lab Data:   Basic Metabolic Panel: Recent Labs  Lab 02/18/22 0509 02/21/22 0609 02/22/22 0919 02/23/22 0517 02/24/22 0845  NA 139  --  136 138  --   K 3.5  --  3.9 3.6  --   CL 106  --  105 109  --   CO2 23  --  22 21*  --   GLUCOSE 123*  --  131* 86  --   BUN 16  --  14 13  --   CREATININE <0.30*  --  <0.30* <0.30*  --   CALCIUM 9.3  --  9.0 9.0  --   MG 1.9 1.9 1.8 1.9 1.7  PHOS 4.0 3.7  --  3.6 3.9    ABG: No results for input(s): "PHART", "PCO2ART", "PO2ART", "HCO3", "O2SAT" in the last 168 hours.  Liver Function Tests: Recent Labs  Lab 02/18/22 0509 02/22/22 0919  AST 19 21  ALT 36 30  ALKPHOS 66 66  BILITOT 0.4 0.5  PROT 6.5 6.3*  ALBUMIN 3.0* 2.7*   No results for input(s): "LIPASE", "AMYLASE" in the last 168 hours. No results for input(s): "AMMONIA" in the last 168 hours.  CBC: Recent Labs  Lab 02/18/22 0509 02/22/22 0919  WBC 6.9 6.3  NEUTROABS  --  4.4  HGB 8.4* 8.3*  HCT 25.6* 25.6*  MCV 90.5 88.0  PLT 175 PLATELET  CLUMPS NOTED ON SMEAR, UNABLE TO ESTIMATE    Cardiac Enzymes: No results for input(s): "CKTOTAL", "CKMB", "CKMBINDEX", "TROPONINI" in the last 168 hours.  BNP (last 3 results) No results for input(s): "BNP" in the last 8760 hours.  ProBNP (last 3 results) No results for input(s): "PROBNP" in the last 8760 hours.  Radiological Exams: DG Abd Portable 1V  Result Date: 02/23/2022 CLINICAL DATA:  51 year old male with history of ileus. EXAM: PORTABLE ABDOMEN - 1 VIEW COMPARISON:  Abdominal radiograph 02/20/2022. FINDINGS: Diffuse gaseous distention noted throughout the small bowel and colon. Paucity of distal rectal gas. Percutaneous gastrostomy tube with retention balloon projecting over the left upper quadrant of the abdomen. No definite pneumoperitoneum confidently identified. IMPRESSION: 1. Abnormal bowel-gas pattern, likely to represent an ileus. There is notably, however, a paucity of distal rectal gas. The possibility of distal colonic obstruction is not excluded, but not strongly favored based on comparison with prior radiographs. 2. No pneumoperitoneum. Electronically Signed   By: Vinnie Langton M.D.   On: 02/23/2022 06:32    Assessment/Plan Active Problems:   ALS (amyotrophic lateral sclerosis) (HCC)   Septic shock (HCC)   Pseudomonas pneumonia (HCC)   Autonomic dysfunction   Acute on chronic respiratory failure  with hypoxia ([place])   Acute on chronic respiratory failure hypoxia patient is going to continue with assist control on 28% FiO2 Sepsis with shock no change continue to follow along Pseudomonas pneumonia has been treated ALS advanced severe disease Autonomic dysfunction no change   I have personally seen and evaluated the patient, evaluated laboratory and imaging results, formulated the assessment and plan and placed orders. The Patient requires high complexity decision making with multiple systems involvement.  Rounds were done with the Respiratory Therapy Director  and Staff therapists and discussed with nursing staff also.  Allyne Gee, MD [facility] Pulmonary Critical Care Medicine Sleep Medicine

## 2022-02-25 ENCOUNTER — Other Ambulatory Visit (HOSPITAL_COMMUNITY): Payer: Self-pay

## 2022-02-25 DIAGNOSIS — A419 Sepsis, unspecified organism: Secondary | ICD-10-CM | POA: Diagnosis not present

## 2022-02-25 DIAGNOSIS — J9621 Acute and chronic respiratory failure with hypoxia: Secondary | ICD-10-CM | POA: Diagnosis not present

## 2022-02-25 DIAGNOSIS — R6521 Severe sepsis with septic shock: Secondary | ICD-10-CM | POA: Diagnosis not present

## 2022-02-25 DIAGNOSIS — J151 Pneumonia due to Pseudomonas: Secondary | ICD-10-CM | POA: Diagnosis not present

## 2022-02-25 DIAGNOSIS — G909 Disorder of the autonomic nervous system, unspecified: Secondary | ICD-10-CM | POA: Diagnosis not present

## 2022-02-25 DIAGNOSIS — G1221 Amyotrophic lateral sclerosis: Secondary | ICD-10-CM | POA: Diagnosis not present

## 2022-02-25 DIAGNOSIS — K567 Ileus, unspecified: Secondary | ICD-10-CM | POA: Diagnosis not present

## 2022-02-25 DIAGNOSIS — K6389 Other specified diseases of intestine: Secondary | ICD-10-CM | POA: Diagnosis not present

## 2022-02-25 LAB — BASIC METABOLIC PANEL
Anion gap: 6 (ref 5–15)
BUN: 19 mg/dL (ref 6–20)
CO2: 22 mmol/L (ref 22–32)
Calcium: 8.7 mg/dL — ABNORMAL LOW (ref 8.9–10.3)
Chloride: 108 mmol/L (ref 98–111)
Creatinine, Ser: <0.3 mg/dL — ABNORMAL LOW (ref 0.61–1.24)
Glucose, Bld: 112 mg/dL — ABNORMAL HIGH (ref 70–99)
Potassium: 3.6 mmol/L (ref 3.5–5.1)
Sodium: 136 mmol/L (ref 135–145)

## 2022-02-25 LAB — MAGNESIUM: Magnesium: 1.9 mg/dL (ref 1.7–2.4)

## 2022-02-25 NOTE — Progress Notes (Addendum)
Pulmonary Critical Care Medicine [place]   PULMONARY CRITICAL CARE SERVICE  PROGRESS NOTE     Jerry [name]  GUY:[id number]  DOB: [birth year]   DOA: 01/08/2022  Referring Physician: Satira Sark, MD  HPI: Jerry [name] is a 51 y.o. male being followed for ventilator/airway/oxygen weaning Acute on Chronic Respiratory Failure.  Patient seen lying in bed, currently remains on full support.  It appears as patient has a ileus at the moment.  We are going to hold weaning indefinitely at this time.  Patient has been deemed baseline vent dependent.  Medications: Reviewed on Rounds  Physical Exam:  Vitals: Temp 97.7, pulse 89, respirations 18, BP 102/63, SPO2 100%  Ventilator Settings AC VC, FiO2 28%, tidal volume 500, rate 16, PEEP 5  General: Comfortable at this time Neck: supple Cardiovascular: no malignant arrhythmias Respiratory: Bilaterally clear but diminished Skin: no rash seen on limited exam Musculoskeletal: No gross abnormality Psychiatric:unable to assess Neurologic:no involuntary movements         Lab Data:   Basic Metabolic Panel: Recent Labs  Lab 02/21/22 0609 02/22/22 0919 02/23/22 0517 02/24/22 0845 02/25/22 0635  NA  --  136 138  --  136  K  --  3.9 3.6  --  3.6  CL  --  105 109  --  108  CO2  --  22 21*  --  22  GLUCOSE  --  131* 86  --  112*  BUN  --  14 13  --  19  CREATININE  --  <0.30* <0.30*  --  <0.30*  CALCIUM  --  9.0 9.0  --  8.7*  MG 1.9 1.8 1.9 1.7 1.9  PHOS 3.7  --  3.6 3.9  --     ABG: No results for input(s): "PHART", "PCO2ART", "PO2ART", "HCO3", "O2SAT" in the last 168 hours.  Liver Function Tests: Recent Labs  Lab 02/22/22 0919  AST 21  ALT 30  ALKPHOS 66  BILITOT 0.5  PROT 6.3*  ALBUMIN 2.7*   No results for input(s): "LIPASE", "AMYLASE" in the last 168 hours. No results for input(s): "AMMONIA" in the last 168 hours.  CBC: Recent Labs  Lab 02/22/22 0919  WBC 6.3  NEUTROABS 4.4  HGB 8.3*   HCT 25.6*  MCV 88.0  PLT PLATELET CLUMPS NOTED ON SMEAR, UNABLE TO ESTIMATE    Cardiac Enzymes: No results for input(s): "CKTOTAL", "CKMB", "CKMBINDEX", "TROPONINI" in the last 168 hours.  BNP (last 3 results) No results for input(s): "BNP" in the last 8760 hours.  ProBNP (last 3 results) No results for input(s): "PROBNP" in the last 8760 hours.  Radiological Exams: DG Abd Portable 1V  Result Date: 02/25/2022 CLINICAL DATA:  51 year old male with history of ileus. EXAM: PORTABLE ABDOMEN - 1 VIEW COMPARISON:  Abdominal radiograph 10/24/2021. FINDINGS: Gaseous distention is again noted throughout both the small bowel and colon, slightly improved compared to prior examinations. Distal colonic and rectal gas is noted. No pneumoperitoneum. Percutaneous gastrostomy tube retention balloon projecting over the epigastric region. IMPRESSION: 1. Abnormal but improving bowel gas pattern, once again favored to represent an ileus. 2. No pneumoperitoneum. Electronically Signed   By: Vinnie Langton M.D.   On: 02/25/2022 05:43    Assessment/Plan Active Problems:   ALS (amyotrophic lateral sclerosis) (HCC)   Septic shock (HCC)   Pseudomonas pneumonia (HCC)   Autonomic dysfunction   Acute on chronic respiratory failure with hypoxia (HCC)  Acute on chronic respiratory failure hypoxia-patient currently on new  baseline of full support.  No weaning attempts we will continue.  Continue with supportive care and pulmonary toilet. Status post tracheostomy-remains in place indefinitely.  Continue trach care per protocol. Pseudomonas pneumonia- has been treated. ALS advanced -severe disease.  Continue with supportive care. Autonomic dysfunction -no change overall.   I have personally seen and evaluated the patient, evaluated laboratory and imaging results, formulated the assessment and plan and placed orders. The Patient requires high complexity decision making with multiple systems involvement.  Rounds  were done with the Respiratory Therapy Director and Staff therapists and discussed with nursing staff also.  Allyne Gee, MD [facility] Pulmonary Critical Care Medicine Sleep Medicine

## 2022-02-26 ENCOUNTER — Other Ambulatory Visit (HOSPITAL_COMMUNITY): Payer: Self-pay

## 2022-02-26 DIAGNOSIS — K6389 Other specified diseases of intestine: Secondary | ICD-10-CM | POA: Diagnosis not present

## 2022-02-26 DIAGNOSIS — J151 Pneumonia due to Pseudomonas: Secondary | ICD-10-CM | POA: Diagnosis not present

## 2022-02-26 DIAGNOSIS — A419 Sepsis, unspecified organism: Secondary | ICD-10-CM | POA: Diagnosis not present

## 2022-02-26 DIAGNOSIS — J9621 Acute and chronic respiratory failure with hypoxia: Secondary | ICD-10-CM | POA: Diagnosis not present

## 2022-02-26 DIAGNOSIS — K567 Ileus, unspecified: Secondary | ICD-10-CM | POA: Diagnosis not present

## 2022-02-26 DIAGNOSIS — G1221 Amyotrophic lateral sclerosis: Secondary | ICD-10-CM | POA: Diagnosis not present

## 2022-02-26 DIAGNOSIS — R6521 Severe sepsis with septic shock: Secondary | ICD-10-CM | POA: Diagnosis not present

## 2022-02-26 DIAGNOSIS — G909 Disorder of the autonomic nervous system, unspecified: Secondary | ICD-10-CM | POA: Diagnosis not present

## 2022-02-26 NOTE — Progress Notes (Cosign Needed)
Pulmonary Critical Care Medicine [place]   PULMONARY CRITICAL CARE SERVICE  PROGRESS NOTE     Jerry [name]  PYK:[id number]  DOB: 1971/03/01   DOA: 01/08/2022  Referring Physician: Satira Sark, MD  HPI: Jerry [name] is a 51 y.o. male being followed for ventilator/airway/oxygen weaning Acute on Chronic Respiratory Failure.  Patient seen lying in bed, currently remains on full support.  Patient has been deemed vent dependent at this time, we will refrain from any further weaning attempts due to weeks of unfortunately tolerating weaning..  Discussion with internal medicine this morning.  Medications: Reviewed on Rounds  Physical Exam:  Vitals: Temp 96.6, pulse 65, respirations 20, BP 113/65, SPO2 100%  Ventilator Settings AC VC, FiO2 28%, tidal volume 500, rate 16, PEEP 5  General: Comfortable at this time Neck: supple Cardiovascular: no malignant arrhythmias Respiratory: Bilaterally clear but diminished Skin: no rash seen on limited exam Musculoskeletal: No gross abnormality Psychiatric:unable to assess Neurologic:no involuntary movements         Lab Data:   Basic Metabolic Panel: Recent Labs  Lab 02/21/22 0609 02/22/22 0919 02/23/22 0517 02/24/22 0845 02/25/22 0635  NA  --  136 138  --  136  K  --  3.9 3.6  --  3.6  CL  --  105 109  --  108  CO2  --  22 21*  --  22  GLUCOSE  --  131* 86  --  112*  BUN  --  14 13  --  19  CREATININE  --  <0.30* <0.30*  --  <0.30*  CALCIUM  --  9.0 9.0  --  8.7*  MG 1.9 1.8 1.9 1.7 1.9  PHOS 3.7  --  3.6 3.9  --     ABG: No results for input(s): "PHART", "PCO2ART", "PO2ART", "HCO3", "O2SAT" in the last 168 hours.  Liver Function Tests: Recent Labs  Lab 02/22/22 0919  AST 21  ALT 30  ALKPHOS 66  BILITOT 0.5  PROT 6.3*  ALBUMIN 2.7*   No results for input(s): "LIPASE", "AMYLASE" in the last 168 hours. No results for input(s): "AMMONIA" in the last 168 hours.  CBC: Recent Labs  Lab  02/22/22 0919  WBC 6.3  NEUTROABS 4.4  HGB 8.3*  HCT 25.6*  MCV 88.0  PLT PLATELET CLUMPS NOTED ON SMEAR, UNABLE TO ESTIMATE    Cardiac Enzymes: No results for input(s): "CKTOTAL", "CKMB", "CKMBINDEX", "TROPONINI" in the last 168 hours.  BNP (last 3 results) No results for input(s): "BNP" in the last 8760 hours.  ProBNP (last 3 results) No results for input(s): "PROBNP" in the last 8760 hours.  Radiological Exams: DG Abd Portable 1V  Result Date: 02/26/2022 CLINICAL DATA:  Ileus EXAM: PORTABLE ABDOMEN - 1 VIEW COMPARISON:  KUB 1 day prior FINDINGS: Diffuse gaseous distention of small and large bowel throughout the abdomen is stable to slightly improved from the prior study. A gastrostomy tube is again noted. There is no definite free intraperitoneal air. IMPRESSION: Stable to stable to slightly improved gaseous distention of the small and large bowel. Electronically Signed   By: Valetta Mole M.D.   On: 02/26/2022 13:36   DG Abd Portable 1V  Result Date: 02/25/2022 CLINICAL DATA:  51 year old male with history of ileus. EXAM: PORTABLE ABDOMEN - 1 VIEW COMPARISON:  Abdominal radiograph 10/24/2021. FINDINGS: Gaseous distention is again noted throughout both the small bowel and colon, slightly improved compared to prior examinations. Distal colonic and rectal gas is noted.  No pneumoperitoneum. Percutaneous gastrostomy tube retention balloon projecting over the epigastric region. IMPRESSION: 1. Abnormal but improving bowel gas pattern, once again favored to represent an ileus. 2. No pneumoperitoneum. Electronically Signed   By: Vinnie Langton M.D.   On: 02/25/2022 05:43    Assessment/Plan Active Problems:   ALS (amyotrophic lateral sclerosis) (HCC)   Septic shock (HCC)   Pseudomonas pneumonia (HCC)   Autonomic dysfunction   Acute on chronic respiratory failure with hypoxia (HCC)   Acute on chronic respiratory failure hypoxia-patient currently on new baseline of full support.  No  weaning attempts we will continue.  Continue with supportive care and pulmonary toilet. Status post tracheostomy-remains in place indefinitely.  Continue trach care per protocol. Pseudomonas pneumonia- has been treated. ALS advanced -severe disease.  Continue with supportive care. Autonomic dysfunction -no change overall.   I have personally seen and evaluated the patient, evaluated laboratory and imaging results, formulated the assessment and plan and placed orders. The Patient requires high complexity decision making with multiple systems involvement.  Rounds were done with the Respiratory Therapy Director and Staff therapists and discussed with nursing staff also.  Allyne Gee, MD [facility] Pulmonary Critical Care Medicine Sleep Medicine

## 2022-02-27 DIAGNOSIS — G909 Disorder of the autonomic nervous system, unspecified: Secondary | ICD-10-CM | POA: Diagnosis not present

## 2022-02-27 DIAGNOSIS — J9621 Acute and chronic respiratory failure with hypoxia: Secondary | ICD-10-CM | POA: Diagnosis not present

## 2022-02-27 DIAGNOSIS — J151 Pneumonia due to Pseudomonas: Secondary | ICD-10-CM | POA: Diagnosis not present

## 2022-02-27 DIAGNOSIS — R6521 Severe sepsis with septic shock: Secondary | ICD-10-CM | POA: Diagnosis not present

## 2022-02-27 DIAGNOSIS — G1221 Amyotrophic lateral sclerosis: Secondary | ICD-10-CM | POA: Diagnosis not present

## 2022-02-27 DIAGNOSIS — A419 Sepsis, unspecified organism: Secondary | ICD-10-CM | POA: Diagnosis not present

## 2022-02-27 LAB — BASIC METABOLIC PANEL
Anion gap: 6 (ref 5–15)
BUN: 20 mg/dL (ref 6–20)
CO2: 23 mmol/L (ref 22–32)
Calcium: 9.2 mg/dL (ref 8.9–10.3)
Chloride: 110 mmol/L (ref 98–111)
Creatinine, Ser: <0.3 mg/dL — ABNORMAL LOW (ref 0.61–1.24)
Glucose, Bld: 110 mg/dL — ABNORMAL HIGH (ref 70–99)
Potassium: 3.8 mmol/L (ref 3.5–5.1)
Sodium: 139 mmol/L (ref 135–145)

## 2022-02-27 LAB — CBC
HCT: 26.8 % — ABNORMAL LOW (ref 39.0–52.0)
Hemoglobin: 8.5 g/dL — ABNORMAL LOW (ref 13.0–17.0)
MCH: 28.7 pg (ref 26.0–34.0)
MCHC: 31.7 g/dL (ref 30.0–36.0)
MCV: 90.5 fL (ref 80.0–100.0)
Platelets: 237 10*3/uL (ref 150–400)
RBC: 2.96 MIL/uL — ABNORMAL LOW (ref 4.22–5.81)
RDW: 14.6 % (ref 11.5–15.5)
WBC: 7.3 10*3/uL (ref 4.0–10.5)
nRBC: 0 % (ref 0.0–0.2)

## 2022-02-27 LAB — PHOSPHORUS: Phosphorus: 4.1 mg/dL (ref 2.5–4.6)

## 2022-02-27 LAB — MAGNESIUM: Magnesium: 1.9 mg/dL (ref 1.7–2.4)

## 2022-02-27 NOTE — Progress Notes (Cosign Needed)
Pulmonary Critical Care Medicine [place]   PULMONARY CRITICAL CARE SERVICE  PROGRESS NOTE     Jerry [name]  NLG:[id number]  DOB: [birth year]   DOA: 01/08/2022  Referring Physician: Satira Sark, MD  HPI: Jerry [name] is a 51 y.o. male being followed for ventilator/airway/oxygen weaning Acute on Chronic Respiratory Failure.  Patient seen lying in bed, currently remains on his baseline of full support.  No obvious acute distress.  Medications: Reviewed on Rounds  Physical Exam:  Vitals: Temp 97.6, pulse 58, respirations 20, BP 102/63, SPO2 100%  Ventilator Settings AC VC, FiO2 20%, tidal volume 500, rate 16, PEEP 5  General: Comfortable at this time Neck: supple Cardiovascular: no malignant arrhythmias Respiratory: Bilaterally clear but diminished Skin: no rash seen on limited exam Musculoskeletal: No gross abnormality Psychiatric:unable to assess Neurologic:no involuntary movements         Lab Data:   Basic Metabolic Panel: Recent Labs  Lab 02/21/22 0609 02/22/22 0919 02/23/22 0517 02/24/22 0845 02/25/22 0635 02/27/22 0739  NA  --  136 138  --  136 139  K  --  3.9 3.6  --  3.6 3.8  CL  --  105 109  --  108 110  CO2  --  22 21*  --  22 23  GLUCOSE  --  131* 86  --  112* 110*  BUN  --  14 13  --  19 20  CREATININE  --  <0.30* <0.30*  --  <0.30* <0.30*  CALCIUM  --  9.0 9.0  --  8.7* 9.2  MG 1.9 1.8 1.9 1.7 1.9 1.9  PHOS 3.7  --  3.6 3.9  --  4.1    ABG: No results for input(s): "PHART", "PCO2ART", "PO2ART", "HCO3", "O2SAT" in the last 168 hours.  Liver Function Tests: Recent Labs  Lab 02/22/22 0919  AST 21  ALT 30  ALKPHOS 66  BILITOT 0.5  PROT 6.3*  ALBUMIN 2.7*   No results for input(s): "LIPASE", "AMYLASE" in the last 168 hours. No results for input(s): "AMMONIA" in the last 168 hours.  CBC: Recent Labs  Lab 02/22/22 0919 02/27/22 0739  WBC 6.3 7.3  NEUTROABS 4.4  --   HGB 8.3* 8.5*  HCT 25.6* 26.8*  MCV 88.0  90.5  PLT PLATELET CLUMPS NOTED ON SMEAR, UNABLE TO ESTIMATE 237    Cardiac Enzymes: No results for input(s): "CKTOTAL", "CKMB", "CKMBINDEX", "TROPONINI" in the last 168 hours.  BNP (last 3 results) No results for input(s): "BNP" in the last 8760 hours.  ProBNP (last 3 results) No results for input(s): "PROBNP" in the last 8760 hours.  Radiological Exams: DG Abd Portable 1V  Result Date: 02/26/2022 CLINICAL DATA:  Ileus EXAM: PORTABLE ABDOMEN - 1 VIEW COMPARISON:  KUB 1 day prior FINDINGS: Diffuse gaseous distention of small and large bowel throughout the abdomen is stable to slightly improved from the prior study. A gastrostomy tube is again noted. There is no definite free intraperitoneal air. IMPRESSION: Stable to stable to slightly improved gaseous distention of the small and large bowel. Electronically Signed   By: Valetta Mole M.D.   On: 02/26/2022 13:36    Assessment/Plan Active Problems:   ALS (amyotrophic lateral sclerosis) (HCC)   Septic shock (HCC)   Pseudomonas pneumonia (HCC)   Autonomic dysfunction   Acute on chronic respiratory failure with hypoxia (HCC)  Acute on chronic respiratory failure hypoxia-patient currently on new baseline of full support.  No weaning attempts we will continue.  Continue with supportive care and pulmonary toilet. Status post tracheostomy-remains in place indefinitely.  Continue trach care per protocol. Pseudomonas pneumonia- has been treated. ALS advanced -severe disease.  Continue with supportive care. Autonomic dysfunction -no change overall.     I have personally seen and evaluated the patient, evaluated laboratory and imaging results, formulated the assessment and plan and placed orders. The Patient requires high complexity decision making with multiple systems involvement.  Rounds were done with the Respiratory Therapy Director and Staff therapists and discussed with nursing staff also.  Allyne Gee, MD [facility] Pulmonary Critical  Care Medicine Sleep Medicine

## 2022-02-28 DIAGNOSIS — J9621 Acute and chronic respiratory failure with hypoxia: Secondary | ICD-10-CM | POA: Diagnosis not present

## 2022-02-28 DIAGNOSIS — R6521 Severe sepsis with septic shock: Secondary | ICD-10-CM | POA: Diagnosis not present

## 2022-02-28 DIAGNOSIS — G1221 Amyotrophic lateral sclerosis: Secondary | ICD-10-CM | POA: Diagnosis not present

## 2022-02-28 DIAGNOSIS — G909 Disorder of the autonomic nervous system, unspecified: Secondary | ICD-10-CM | POA: Diagnosis not present

## 2022-02-28 DIAGNOSIS — J151 Pneumonia due to Pseudomonas: Secondary | ICD-10-CM | POA: Diagnosis not present

## 2022-02-28 DIAGNOSIS — A419 Sepsis, unspecified organism: Secondary | ICD-10-CM | POA: Diagnosis not present

## 2022-02-28 NOTE — Progress Notes (Cosign Needed)
Pulmonary Critical Care Medicine [place]   PULMONARY CRITICAL CARE SERVICE  PROGRESS NOTE     Laura Radilla  RXV:[id number]  DOB: 1971-03-13   DOA: 01/08/2022  Referring Physician: Satira Sark, MD  HPI: Jerry [name] is a 51 y.o. male being followed for ventilator/airway/oxygen weaning Acute on Chronic Respiratory Failure.  Patient seen lying in bed, currently on his full support settings.  Family at bedside.  According to respiratory therapy they have done daily education regarding suctioning and trach care with the family.  Medications: Reviewed on Rounds  Physical Exam:  Vitals: Temp 98.4, pulse 68, respirations 18, BP 95/53, SPO2 100%  Ventilator Settings AC VC, FiO2 28%, tidal volume 500, rate 12, PEEP 5  General: Comfortable at this time Neck: supple Cardiovascular: no malignant arrhythmias Respiratory: Bilaterally clear but diminished Skin: no rash seen on limited exam Musculoskeletal: No gross abnormality Psychiatric:unable to assess Neurologic:no involuntary movements         Lab Data:   Basic Metabolic Panel: Recent Labs  Lab 02/22/22 0919 02/23/22 0517 02/24/22 0845 02/25/22 0635 02/27/22 0739  NA 136 138  --  136 139  K 3.9 3.6  --  3.6 3.8  CL 105 109  --  108 110  CO2 22 21*  --  22 23  GLUCOSE 131* 86  --  112* 110*  BUN 14 13  --  19 20  CREATININE <0.30* <0.30*  --  <0.30* <0.30*  CALCIUM 9.0 9.0  --  8.7* 9.2  MG 1.8 1.9 1.7 1.9 1.9  PHOS  --  3.6 3.9  --  4.1    ABG: No results for input(s): "PHART", "PCO2ART", "PO2ART", "HCO3", "O2SAT" in the last 168 hours.  Liver Function Tests: Recent Labs  Lab 02/22/22 0919  AST 21  ALT 30  ALKPHOS 66  BILITOT 0.5  PROT 6.3*  ALBUMIN 2.7*   No results for input(s): "LIPASE", "AMYLASE" in the last 168 hours. No results for input(s): "AMMONIA" in the last 168 hours.  CBC: Recent Labs  Lab 02/22/22 0919 02/27/22 0739  WBC 6.3 7.3  NEUTROABS 4.4  --   HGB 8.3*  8.5*  HCT 25.6* 26.8*  MCV 88.0 90.5  PLT PLATELET CLUMPS NOTED ON SMEAR, UNABLE TO ESTIMATE 237    Cardiac Enzymes: No results for input(s): "CKTOTAL", "CKMB", "CKMBINDEX", "TROPONINI" in the last 168 hours.  BNP (last 3 results) No results for input(s): "BNP" in the last 8760 hours.  ProBNP (last 3 results) No results for input(s): "PROBNP" in the last 8760 hours.  Radiological Exams: DG Abd Portable 1V  Result Date: 02/26/2022 CLINICAL DATA:  Ileus EXAM: PORTABLE ABDOMEN - 1 VIEW COMPARISON:  KUB 1 day prior FINDINGS: Diffuse gaseous distention of small and large bowel throughout the abdomen is stable to slightly improved from the prior study. A gastrostomy tube is again noted. There is no definite free intraperitoneal air. IMPRESSION: Stable to stable to slightly improved gaseous distention of the small and large bowel. Electronically Signed   By: Valetta Mole M.D.   On: 02/26/2022 13:36    Assessment/Plan Active Problems:   ALS (amyotrophic lateral sclerosis) (HCC)   Septic shock (HCC)   Pseudomonas pneumonia (HCC)   Autonomic dysfunction   Acute on chronic respiratory failure with hypoxia (HCC)  Acute on chronic respiratory failure hypoxia-patient currently on new baseline of full support.  No weaning attempts we will continue.  Continue with supportive care and pulmonary toilet. Status post tracheostomy-remains in place  indefinitely.  Continue trach care per protocol.  Continue family education. Pseudomonas pneumonia- has been treated. ALS advanced -severe disease.  Continue with supportive care. Autonomic dysfunction -no change overall.   I have personally seen and evaluated the patient, evaluated laboratory and imaging results, formulated the assessment and plan and placed orders. The Patient requires high complexity decision making with multiple systems involvement.  Rounds were done with the Respiratory Therapy Director and Staff therapists and discussed with nursing  staff also.  Allyne Gee, MD [facility] Pulmonary Critical Care Medicine Sleep Medicine

## 2022-03-01 ENCOUNTER — Other Ambulatory Visit (HOSPITAL_COMMUNITY): Payer: Self-pay

## 2022-03-01 DIAGNOSIS — J151 Pneumonia due to Pseudomonas: Secondary | ICD-10-CM | POA: Diagnosis not present

## 2022-03-01 DIAGNOSIS — K567 Ileus, unspecified: Secondary | ICD-10-CM | POA: Diagnosis not present

## 2022-03-01 DIAGNOSIS — J9621 Acute and chronic respiratory failure with hypoxia: Secondary | ICD-10-CM | POA: Diagnosis not present

## 2022-03-01 DIAGNOSIS — R6521 Severe sepsis with septic shock: Secondary | ICD-10-CM | POA: Diagnosis not present

## 2022-03-01 DIAGNOSIS — A419 Sepsis, unspecified organism: Secondary | ICD-10-CM | POA: Diagnosis not present

## 2022-03-01 DIAGNOSIS — G909 Disorder of the autonomic nervous system, unspecified: Secondary | ICD-10-CM | POA: Diagnosis not present

## 2022-03-01 DIAGNOSIS — G1221 Amyotrophic lateral sclerosis: Secondary | ICD-10-CM | POA: Diagnosis not present

## 2022-03-01 LAB — BASIC METABOLIC PANEL
Anion gap: 8 (ref 5–15)
BUN: 16 mg/dL (ref 6–20)
CO2: 23 mmol/L (ref 22–32)
Calcium: 9.2 mg/dL (ref 8.9–10.3)
Chloride: 108 mmol/L (ref 98–111)
Creatinine, Ser: <0.3 mg/dL — ABNORMAL LOW (ref 0.61–1.24)
Glucose, Bld: 112 mg/dL — ABNORMAL HIGH (ref 70–99)
Potassium: 3.7 mmol/L (ref 3.5–5.1)
Sodium: 139 mmol/L (ref 135–145)

## 2022-03-01 LAB — CBC
HCT: 27.2 % — ABNORMAL LOW (ref 39.0–52.0)
Hemoglobin: 8.4 g/dL — ABNORMAL LOW (ref 13.0–17.0)
MCH: 28.2 pg (ref 26.0–34.0)
MCHC: 30.9 g/dL (ref 30.0–36.0)
MCV: 91.3 fL (ref 80.0–100.0)
Platelets: 205 10*3/uL (ref 150–400)
RBC: 2.98 MIL/uL — ABNORMAL LOW (ref 4.22–5.81)
RDW: 15 % (ref 11.5–15.5)
WBC: 6.4 10*3/uL (ref 4.0–10.5)
nRBC: 0 % (ref 0.0–0.2)

## 2022-03-01 LAB — MAGNESIUM: Magnesium: 1.9 mg/dL (ref 1.7–2.4)

## 2022-03-02 ENCOUNTER — Ambulatory Visit: Payer: Self-pay

## 2022-03-02 DIAGNOSIS — A419 Sepsis, unspecified organism: Secondary | ICD-10-CM | POA: Diagnosis not present

## 2022-03-02 DIAGNOSIS — J9621 Acute and chronic respiratory failure with hypoxia: Secondary | ICD-10-CM | POA: Diagnosis not present

## 2022-03-02 DIAGNOSIS — G909 Disorder of the autonomic nervous system, unspecified: Secondary | ICD-10-CM | POA: Diagnosis not present

## 2022-03-02 DIAGNOSIS — R6521 Severe sepsis with septic shock: Secondary | ICD-10-CM | POA: Diagnosis not present

## 2022-03-02 DIAGNOSIS — J151 Pneumonia due to Pseudomonas: Secondary | ICD-10-CM | POA: Diagnosis not present

## 2022-03-02 DIAGNOSIS — G1221 Amyotrophic lateral sclerosis: Secondary | ICD-10-CM | POA: Diagnosis not present

## 2022-03-02 LAB — MAGNESIUM: Magnesium: 2 mg/dL (ref 1.7–2.4)

## 2022-03-02 LAB — PHOSPHORUS: Phosphorus: 4 mg/dL (ref 2.5–4.6)

## 2022-03-02 NOTE — Progress Notes (Cosign Needed)
Pulmonary Critical Care Medicine [place]   PULMONARY CRITICAL CARE SERVICE  PROGRESS NOTE     Jerry [name]  TIR:[id number]  DOB: Nov 02, 1970   DOA: 01/08/2022  Referring Physician: Satira Sark, MD  HPI: Jerry [name] is a 51 y.o. male being followed for ventilator/airway/oxygen weaning Acute on Chronic Respiratory Failure.  Patient seen lying in bed, currently remains currently remains on baseline of full support currently remains on baseline of full support.  Family at bedside, continues to work with respiratory continues to work with respiratory therapy for herself for suctioning and trach care education.  Medications: Reviewed on Rounds  Physical Exam:  Vitals: Temp 97.4, pulse 69, respirations 28, BP  124/1965, SBP 124/65, SPO2 99%  Ventilator Settings AC VC, FiO2 20%, tidal volume 500, rate 16, PEEP 5  General: Comfortable at this time Neck: supple Cardiovascular: no malignant arrhythmias Respiratory: Bilaterally clear but diminished Skin: no rash seen on limited exam Musculoskeletal: No gross abnormality Psychiatric:unable to assess Neurologic:no involuntary movements         Lab Data:   Basic Metabolic Panel: Recent Labs  Lab 02/24/22 0845 02/25/22 0635 02/27/22 0739 03/01/22 0842 03/02/22 0311  NA  --  136 139 139  --   K  --  3.6 3.8 3.7  --   CL  --  108 110 108  --   CO2  --  '22 23 23  '$ --   GLUCOSE  --  112* 110* 112*  --   BUN  --  '19 20 16  '$ --   CREATININE  --  <0.30* <0.30* <0.30*  --   CALCIUM  --  8.7* 9.2 9.2  --   MG 1.7 1.9 1.9 1.9 2.0  PHOS 3.9  --  4.1  --  4.0    ABG: No results for input(s): "PHART", "PCO2ART", "PO2ART", "HCO3", "O2SAT" in the last 168 hours.  Liver Function Tests: No results for input(s): "AST", "ALT", "ALKPHOS", "BILITOT", "PROT", "ALBUMIN" in the last 168 hours. No results for input(s): "LIPASE", "AMYLASE" in the last 168 hours. No results for input(s): "AMMONIA" in the last 168  hours.  CBC: Recent Labs  Lab 02/27/22 0739 03/01/22 0842  WBC 7.3 6.4  HGB 8.5* 8.4*  HCT 26.8* 27.2*  MCV 90.5 91.3  PLT 237 205    Cardiac Enzymes: No results for input(s): "CKTOTAL", "CKMB", "CKMBINDEX", "TROPONINI" in the last 168 hours.  BNP (last 3 results) No results for input(s): "BNP" in the last 8760 hours.  ProBNP (last 3 results) No results for input(s): "PROBNP" in the last 8760 hours.  Radiological Exams: DG Abd Portable 1V  Result Date: 03/01/2022 CLINICAL DATA:  Follow-up ileus EXAM: PORTABLE ABDOMEN - 1 VIEW COMPARISON:  Three days ago FINDINGS: Percutaneous gastrostomy tube. Suprapubic catheter. Diffuse gas filling of bowel without worrisome distension or evidence of pneumatosis. No concerning mass effect or calcification. IMPRESSION: Unchanged diffuse bowel gas. Electronically Signed   By: Jorje Guild M.D.   On: 03/01/2022 07:16    Assessment/Plan Active Problems:   ALS (amyotrophic lateral sclerosis) (HCC)   Septic shock (HCC)   Pseudomonas pneumonia (HCC)   Autonomic dysfunction   Acute on chronic respiratory failure with hypoxia (HCC)   Acute on chronic respiratory failure hypoxia-patient currently on new baseline of full support.  No weaning attempts we will continue.  Continue with supportive care and pulmonary toilet. Status post tracheostomy-remains in place indefinitely.  Continue trach care per protocol.  Continue family education. Pseudomonas pneumonia-  has been treated. ALS advanced -severe disease.  Continue with supportive care. Autonomic dysfunction -no change overall.   I have personally seen and evaluated the patient, evaluated laboratory and imaging results, formulated the assessment and plan and placed orders. The Patient requires high complexity decision making with multiple systems involvement.  Rounds were done with the Respiratory Therapy Director and Staff therapists and discussed with nursing staff also.  Allyne Gee,  MD [facility] Pulmonary Critical Care Medicine Sleep Medicine

## 2022-03-02 NOTE — Progress Notes (Cosign Needed)
Pulmonary Critical Care Medicine [place]   PULMONARY CRITICAL CARE SERVICE  PROGRESS NOTE     Jerry [name]  RKY:[id number]  DOB: August 31, 1970   DOA: 01/08/2022  Referring Physician: Satira Sark, MD  HPI: Jerry [name] is a 51 y.o. male being followed for ventilator/airway/oxygen weaning Acute on Chronic Respiratory Failure.  Patient seen lying in bed, remains on his new baseline of full support.  Respiratory continues to work with family for trach and suction education.  Medications: Reviewed on Rounds  Physical Exam:  Vitals: Temp 96.8, pulse 55, respirations 19, BP 108/65, SPO2 100%  Ventilator Settings AC VC FiO2 28%, tidal volume 500, rate 16, PEEP 5  General: Comfortable at this time Neck: supple Cardiovascular: no malignant arrhythmias Respiratory: Bilaterally bilaterally clear Skin: no rash seen on limited exam Musculoskeletal: No gross abnormality Psychiatric:unable to assess Neurologic:no involuntary movements         Lab Data:   Basic Metabolic Panel: Recent Labs  Lab 02/24/22 0845 02/25/22 0635 02/27/22 0739 03/01/22 0842 03/02/22 0311  NA  --  136 139 139  --   K  --  3.6 3.8 3.7  --   CL  --  108 110 108  --   CO2  --  '22 23 23  '$ --   GLUCOSE  --  112* 110* 112*  --   BUN  --  '19 20 16  '$ --   CREATININE  --  <0.30* <0.30* <0.30*  --   CALCIUM  --  8.7* 9.2 9.2  --   MG 1.7 1.9 1.9 1.9 2.0  PHOS 3.9  --  4.1  --  4.0    ABG: No results for input(s): "PHART", "PCO2ART", "PO2ART", "HCO3", "O2SAT" in the last 168 hours.  Liver Function Tests: No results for input(s): "AST", "ALT", "ALKPHOS", "BILITOT", "PROT", "ALBUMIN" in the last 168 hours. No results for input(s): "LIPASE", "AMYLASE" in the last 168 hours. No results for input(s): "AMMONIA" in the last 168 hours.  CBC: Recent Labs  Lab 02/27/22 0739 03/01/22 0842  WBC 7.3 6.4  HGB 8.5* 8.4*  HCT 26.8* 27.2*  MCV 90.5 91.3  PLT 237 205    Cardiac Enzymes: No  results for input(s): "CKTOTAL", "CKMB", "CKMBINDEX", "TROPONINI" in the last 168 hours.  BNP (last 3 results) No results for input(s): "BNP" in the last 8760 hours.  ProBNP (last 3 results) No results for input(s): "PROBNP" in the last 8760 hours.  Radiological Exams: DG Abd Portable 1V  Result Date: 03/01/2022 CLINICAL DATA:  Follow-up ileus EXAM: PORTABLE ABDOMEN - 1 VIEW COMPARISON:  Three days ago FINDINGS: Percutaneous gastrostomy tube. Suprapubic catheter. Diffuse gas filling of bowel without worrisome distension or evidence of pneumatosis. No concerning mass effect or calcification. IMPRESSION: Unchanged diffuse bowel gas. Electronically Signed   By: Jorje Guild M.D.   On: 03/01/2022 07:16    Assessment/Plan Active Problems:   ALS (amyotrophic lateral sclerosis) (HCC)   Septic shock (HCC)   Pseudomonas pneumonia (HCC)   Autonomic dysfunction   Acute on chronic respiratory failure with hypoxia (HCC)   Acute on chronic respiratory failure hypoxia-patient currently on new baseline of full support.  No weaning attempts we will continue.  Continue with supportive care and pulmonary toilet. Status post tracheostomy-remains in place indefinitely.  Continue trach care per protocol.  Continue family education. Pseudomonas pneumonia- has been treated. ALS advanced -severe disease.  Continue with supportive care. Autonomic dysfunction -no change overall.   I have personally seen  and evaluated the patient, evaluated laboratory and imaging results, formulated the assessment and plan and placed orders. The Patient requires high complexity decision making with multiple systems involvement.  Rounds were done with the Respiratory Therapy Director and Staff therapists and discussed with nursing staff also.  Allyne Gee, MD [facility] Pulmonary Critical Care Medicine Sleep Medicine

## 2022-03-03 ENCOUNTER — Other Ambulatory Visit (HOSPITAL_COMMUNITY): Payer: Self-pay

## 2022-03-03 DIAGNOSIS — K567 Ileus, unspecified: Secondary | ICD-10-CM | POA: Diagnosis not present

## 2022-03-03 DIAGNOSIS — J151 Pneumonia due to Pseudomonas: Secondary | ICD-10-CM | POA: Diagnosis not present

## 2022-03-03 DIAGNOSIS — G909 Disorder of the autonomic nervous system, unspecified: Secondary | ICD-10-CM | POA: Diagnosis not present

## 2022-03-03 DIAGNOSIS — R6521 Severe sepsis with septic shock: Secondary | ICD-10-CM | POA: Diagnosis not present

## 2022-03-03 DIAGNOSIS — K6389 Other specified diseases of intestine: Secondary | ICD-10-CM | POA: Diagnosis not present

## 2022-03-03 DIAGNOSIS — G1221 Amyotrophic lateral sclerosis: Secondary | ICD-10-CM | POA: Diagnosis not present

## 2022-03-03 DIAGNOSIS — J9621 Acute and chronic respiratory failure with hypoxia: Secondary | ICD-10-CM | POA: Diagnosis not present

## 2022-03-03 DIAGNOSIS — A419 Sepsis, unspecified organism: Secondary | ICD-10-CM | POA: Diagnosis not present

## 2022-03-03 LAB — TRIGLYCERIDES: Triglycerides: 13 mg/dL (ref <150)

## 2022-03-03 NOTE — Progress Notes (Signed)
Pulmonary Critical Care Medicine [place]   PULMONARY CRITICAL CARE SERVICE  PROGRESS NOTE     Jerry [name]  DXI:[id number]  DOB: [birth year]   DOA: 01/08/2022  Referring Physician: Satira Sark, MD  HPI: Jerry [name] is a 51 y.o. male being followed for ventilator/airway/oxygen weaning Acute on Chronic Respiratory Failure. ***  Medications: Reviewed on Rounds  Physical Exam:  Vitals: ***  Ventilator Settings ***  General: Comfortable at this time Neck: supple Cardiovascular: no malignant arrhythmias Respiratory: *** Skin: no rash seen on limited exam Musculoskeletal: No gross abnormality Psychiatric:unable to assess Neurologic:no involuntary movements         Lab Data:   Basic Metabolic Panel: Recent Labs  Lab 02/25/22 0635 02/27/22 0739 03/01/22 0842 03/02/22 0311  NA 136 139 139  --   K 3.6 3.8 3.7  --   CL 108 110 108  --   CO2 '22 23 23  '$ --   GLUCOSE 112* 110* 112*  --   BUN '19 20 16  '$ --   CREATININE <0.30* <0.30* <0.30*  --   CALCIUM 8.7* 9.2 9.2  --   MG 1.9 1.9 1.9 2.0  PHOS  --  4.1  --  4.0    ABG: No results for input(s): "PHART", "PCO2ART", "PO2ART", "HCO3", "O2SAT" in the last 168 hours.  Liver Function Tests: No results for input(s): "AST", "ALT", "ALKPHOS", "BILITOT", "PROT", "ALBUMIN" in the last 168 hours. No results for input(s): "LIPASE", "AMYLASE" in the last 168 hours. No results for input(s): "AMMONIA" in the last 168 hours.  CBC: Recent Labs  Lab 02/27/22 0739 03/01/22 0842  WBC 7.3 6.4  HGB 8.5* 8.4*  HCT 26.8* 27.2*  MCV 90.5 91.3  PLT 237 205    Cardiac Enzymes: No results for input(s): "CKTOTAL", "CKMB", "CKMBINDEX", "TROPONINI" in the last 168 hours.  BNP (last 3 results) No results for input(s): "BNP" in the last 8760 hours.  ProBNP (last 3 results) No results for input(s): "PROBNP" in the last 8760 hours.  Radiological Exams: DG Abd 1 View  Result Date: 03/03/2022 CLINICAL DATA:   Ileus EXAM: ABDOMEN - 1 VIEW COMPARISON:  Two days ago FINDINGS: Unremarkable percutaneous gastrostomy tube positioning. Unchanged diffuse gaseous distension of bowel without over distension. Elevated left diaphragm. No concerning mass effect or gas collection. IMPRESSION: Unchanged generalized bowel gas. Electronically Signed   By: Jorje Guild M.D.   On: 03/03/2022 06:21    Assessment/Plan Active Problems:   ALS (amyotrophic lateral sclerosis) (HCC)   Septic shock (HCC)   Pseudomonas pneumonia (HCC)   Autonomic dysfunction   Acute on chronic respiratory failure with hypoxia (HCC)   *** *** *** *** ***   I have personally seen and evaluated the patient, evaluated laboratory and imaging results, formulated the assessment and plan and placed orders. The Patient requires high complexity decision making with multiple systems involvement.  Rounds were done with the Respiratory Therapy Director and Staff therapists and discussed with nursing staff also.  Allyne Gee, MD [facility] Pulmonary Critical Care Medicine Sleep Medicine

## 2022-03-04 ENCOUNTER — Other Ambulatory Visit (HOSPITAL_COMMUNITY): Payer: Self-pay

## 2022-03-04 DIAGNOSIS — G1221 Amyotrophic lateral sclerosis: Secondary | ICD-10-CM | POA: Diagnosis not present

## 2022-03-04 DIAGNOSIS — A419 Sepsis, unspecified organism: Secondary | ICD-10-CM | POA: Diagnosis not present

## 2022-03-04 DIAGNOSIS — Z452 Encounter for adjustment and management of vascular access device: Secondary | ICD-10-CM | POA: Diagnosis not present

## 2022-03-04 DIAGNOSIS — J984 Other disorders of lung: Secondary | ICD-10-CM | POA: Diagnosis not present

## 2022-03-04 DIAGNOSIS — R918 Other nonspecific abnormal finding of lung field: Secondary | ICD-10-CM | POA: Diagnosis not present

## 2022-03-04 DIAGNOSIS — J181 Lobar pneumonia, unspecified organism: Secondary | ICD-10-CM | POA: Diagnosis not present

## 2022-03-04 DIAGNOSIS — J151 Pneumonia due to Pseudomonas: Secondary | ICD-10-CM | POA: Diagnosis not present

## 2022-03-04 DIAGNOSIS — R6521 Severe sepsis with septic shock: Secondary | ICD-10-CM | POA: Diagnosis not present

## 2022-03-04 DIAGNOSIS — J9621 Acute and chronic respiratory failure with hypoxia: Secondary | ICD-10-CM | POA: Diagnosis not present

## 2022-03-04 DIAGNOSIS — G909 Disorder of the autonomic nervous system, unspecified: Secondary | ICD-10-CM | POA: Diagnosis not present

## 2022-03-04 LAB — CBC
HCT: 26.1 % — ABNORMAL LOW (ref 39.0–52.0)
Hemoglobin: 8.4 g/dL — ABNORMAL LOW (ref 13.0–17.0)
MCH: 28.6 pg (ref 26.0–34.0)
MCHC: 32.2 g/dL (ref 30.0–36.0)
MCV: 88.8 fL (ref 80.0–100.0)
Platelets: 203 10*3/uL (ref 150–400)
RBC: 2.94 MIL/uL — ABNORMAL LOW (ref 4.22–5.81)
RDW: 14.8 % (ref 11.5–15.5)
WBC: 8.8 10*3/uL (ref 4.0–10.5)
nRBC: 0 % (ref 0.0–0.2)

## 2022-03-04 LAB — BASIC METABOLIC PANEL
Anion gap: 5 (ref 5–15)
BUN: 17 mg/dL (ref 6–20)
CO2: 24 mmol/L (ref 22–32)
Calcium: 8.9 mg/dL (ref 8.9–10.3)
Chloride: 110 mmol/L (ref 98–111)
Creatinine, Ser: <0.3 mg/dL — ABNORMAL LOW (ref 0.61–1.24)
Glucose, Bld: 125 mg/dL — ABNORMAL HIGH (ref 70–99)
Potassium: 3.8 mmol/L (ref 3.5–5.1)
Sodium: 139 mmol/L (ref 135–145)

## 2022-03-04 NOTE — Progress Notes (Signed)
Pulmonary Critical Care Medicine [place]   PULMONARY CRITICAL CARE SERVICE  PROGRESS NOTE     Maanav Kassabian  ONG:[id number]  DOB: 1971/01/06   DOA: 01/08/2022  Referring Physician: Satira Sark, MD  HPI: Jaicob Dia is a 51 y.o. male being followed for ventilator/airway/oxygen weaning Acute on Chronic Respiratory Failure.  Patient is on the ventilator full support patient is not able to wean  Medications: Reviewed on Rounds  Physical Exam:  Vitals: Temperature is 100.0 pulse 69 respiratory rate is 20 blood pressure 102/60 saturations 98%  Ventilator Settings assist-control FiO2 is 28% tidal volume 500 PEEP 5  General: Comfortable at this time Neck: supple Cardiovascular: no malignant arrhythmias Respiratory: No rhonchi very coarse breath sounds Skin: no rash seen on limited exam Musculoskeletal: No gross abnormality Psychiatric:unable to assess Neurologic:no involuntary movements         Lab Data:   Basic Metabolic Panel: Recent Labs  Lab 02/27/22 0739 03/01/22 0842 03/02/22 0311  NA 139 139  --   K 3.8 3.7  --   CL 110 108  --   CO2 23 23  --   GLUCOSE 110* 112*  --   BUN 20 16  --   CREATININE <0.30* <0.30*  --   CALCIUM 9.2 9.2  --   MG 1.9 1.9 2.0  PHOS 4.1  --  4.0    ABG: No results for input(s): "PHART", "PCO2ART", "PO2ART", "HCO3", "O2SAT" in the last 168 hours.  Liver Function Tests: No results for input(s): "AST", "ALT", "ALKPHOS", "BILITOT", "PROT", "ALBUMIN" in the last 168 hours. No results for input(s): "LIPASE", "AMYLASE" in the last 168 hours. No results for input(s): "AMMONIA" in the last 168 hours.  CBC: Recent Labs  Lab 02/27/22 0739 03/01/22 0842  WBC 7.3 6.4  HGB 8.5* 8.4*  HCT 26.8* 27.2*  MCV 90.5 91.3  PLT 237 205    Cardiac Enzymes: No results for input(s): "CKTOTAL", "CKMB", "CKMBINDEX", "TROPONINI" in the last 168 hours.  BNP (last 3 results) No results for input(s): "BNP" in the last  8760 hours.  ProBNP (last 3 results) No results for input(s): "PROBNP" in the last 8760 hours.  Radiological Exams: DG Abd 1 View  Result Date: 03/03/2022 CLINICAL DATA:  Ileus EXAM: ABDOMEN - 1 VIEW COMPARISON:  Two days ago FINDINGS: Unremarkable percutaneous gastrostomy tube positioning. Unchanged diffuse gaseous distension of bowel without over distension. Elevated left diaphragm. No concerning mass effect or gas collection. IMPRESSION: Unchanged generalized bowel gas. Electronically Signed   By: Jorje Guild M.D.   On: 03/03/2022 06:21    Assessment/Plan Active Problems:   ALS (amyotrophic lateral sclerosis) (HCC)   Septic shock (HCC)   Pseudomonas pneumonia (HCC)   Autonomic dysfunction   Acute on chronic respiratory failure with hypoxia (HCC)   Acute on chronic respiratory failure hypoxia plan is to continue with full support patient is at baseline on mechanical ventilation now has low-grade fever Sepsis has resolved but patient has low-grade fever Pseudomonas pneumonia has been treated Autonomic dysfunction patient is at baseline ALS advanced severe disease   I have personally seen and evaluated the patient, evaluated laboratory and imaging results, formulated the assessment and plan and placed orders. The Patient requires high complexity decision making with multiple systems involvement.  Rounds were done with the Respiratory Therapy Director and Staff therapists and discussed with nursing staff also.  Allyne Gee, MD [facility] Pulmonary Critical Care Medicine Sleep Medicine

## 2022-03-05 DIAGNOSIS — R6521 Severe sepsis with septic shock: Secondary | ICD-10-CM | POA: Diagnosis not present

## 2022-03-05 DIAGNOSIS — A419 Sepsis, unspecified organism: Secondary | ICD-10-CM | POA: Diagnosis not present

## 2022-03-05 DIAGNOSIS — G1221 Amyotrophic lateral sclerosis: Secondary | ICD-10-CM | POA: Diagnosis not present

## 2022-03-05 DIAGNOSIS — J151 Pneumonia due to Pseudomonas: Secondary | ICD-10-CM | POA: Diagnosis not present

## 2022-03-05 DIAGNOSIS — G909 Disorder of the autonomic nervous system, unspecified: Secondary | ICD-10-CM | POA: Diagnosis not present

## 2022-03-05 DIAGNOSIS — J9621 Acute and chronic respiratory failure with hypoxia: Secondary | ICD-10-CM | POA: Diagnosis not present

## 2022-03-05 LAB — PHOSPHORUS: Phosphorus: 3.4 mg/dL (ref 2.5–4.6)

## 2022-03-05 LAB — MAGNESIUM
Magnesium: 1.7 mg/dL (ref 1.7–2.4)
Magnesium: 2 mg/dL (ref 1.7–2.4)

## 2022-03-05 NOTE — Progress Notes (Signed)
Pulmonary Critical Care Medicine [place]   PULMONARY CRITICAL CARE SERVICE  PROGRESS NOTE     Jerry [name]  ZOX:[id number]  DOB: 12-29-1970   DOA: 01/08/2022  Referring Physician: Satira Sark, MD  HPI: Jerry [name] is a 51 y.o. male being followed for ventilator/airway/oxygen weaning Acute on Chronic Respiratory Failure.  Patient is on assist control mode currently on 28% FiO2 tidal volumes are good PEEP is 5  Medications: Reviewed on Rounds  Physical Exam:  Vitals: Temperature 98.7 pulse 61 respiratory 21 blood pressure 100/81 saturations 100%  Ventilator Settings assist-control FiO2 is 28% tidal volume 500 PEEP 5  General: Comfortable at this time Neck: supple Cardiovascular: no malignant arrhythmias Respiratory: No rhonchi no rales are noted at this time Skin: no rash seen on limited exam Musculoskeletal: No gross abnormality Psychiatric:unable to assess Neurologic:no involuntary movements         Lab Data:   Basic Metabolic Panel: Recent Labs  Lab 02/27/22 0739 03/01/22 0842 03/02/22 0311 03/04/22 1248 03/05/22 0541  NA 139 139  --  139  --   K 3.8 3.7  --  3.8  --   CL 110 108  --  110  --   CO2 23 23  --  24  --   GLUCOSE 110* 112*  --  125*  --   BUN 20 16  --  17  --   CREATININE <0.30* <0.30*  --  <0.30*  --   CALCIUM 9.2 9.2  --  8.9  --   MG 1.9 1.9 2.0  --  1.7  PHOS 4.1  --  4.0  --  3.4    ABG: No results for input(s): "PHART", "PCO2ART", "PO2ART", "HCO3", "O2SAT" in the last 168 hours.  Liver Function Tests: No results for input(s): "AST", "ALT", "ALKPHOS", "BILITOT", "PROT", "ALBUMIN" in the last 168 hours. No results for input(s): "LIPASE", "AMYLASE" in the last 168 hours. No results for input(s): "AMMONIA" in the last 168 hours.  CBC: Recent Labs  Lab 02/27/22 0739 03/01/22 0842 03/04/22 1248  WBC 7.3 6.4 8.8  HGB 8.5* 8.4* 8.4*  HCT 26.8* 27.2* 26.1*  MCV 90.5 91.3 88.8  PLT 237 205 203     Cardiac Enzymes: No results for input(s): "CKTOTAL", "CKMB", "CKMBINDEX", "TROPONINI" in the last 168 hours.  BNP (last 3 results) No results for input(s): "BNP" in the last 8760 hours.  ProBNP (last 3 results) No results for input(s): "PROBNP" in the last 8760 hours.  Radiological Exams: DG CHEST PORT 1 VIEW  Result Date: 03/04/2022 CLINICAL DATA:  Elevated temperature EXAM: PORTABLE CHEST 1 VIEW COMPARISON:  02/09/2022 FINDINGS: Tracheostomy in good position. Right arm PICC tip in the SVC. Right jugular Port-A-Cath tip at the cavoatrial junction unchanged. Left lower lobe airspace density unchanged. Elevated left hemidiaphragm compatible with volume loss in the left lower lobe. Mild right lower lobe airspace disease has developed in the interval. No effusion. IMPRESSION: Support lines remain in good position Persistent left lower lobe consolidation unchanged Progression of right lower lobe infiltrate, possible pneumonia. Electronically Signed   By: Franchot Gallo M.D.   On: 03/04/2022 16:39    Assessment/Plan Active Problems:   ALS (amyotrophic lateral sclerosis) (HCC)   Septic shock (HCC)   Pseudomonas pneumonia (HCC)   Autonomic dysfunction   Acute on chronic respiratory failure with hypoxia (HCC)   Acute on chronic respiratory failure with hypoxia we will continue with full support on assist control mode patient is being trained  for home care ALS severe end-stage disease Sepsis with shock no change Pseudomonas pneumonia treated we will continue to follow along Autonomic dysfunction no change we will continue to follow   I have personally seen and evaluated the patient, evaluated laboratory and imaging results, formulated the assessment and plan and placed orders. The Patient requires high complexity decision making with multiple systems involvement.  Rounds were done with the Respiratory Therapy Director and Staff therapists and discussed with nursing staff also.  Allyne Gee, MD [facility] Pulmonary Critical Care Medicine Sleep Medicine

## 2022-03-06 DIAGNOSIS — R6521 Severe sepsis with septic shock: Secondary | ICD-10-CM | POA: Diagnosis not present

## 2022-03-06 DIAGNOSIS — J9621 Acute and chronic respiratory failure with hypoxia: Secondary | ICD-10-CM | POA: Diagnosis not present

## 2022-03-06 DIAGNOSIS — G909 Disorder of the autonomic nervous system, unspecified: Secondary | ICD-10-CM | POA: Diagnosis not present

## 2022-03-06 DIAGNOSIS — A419 Sepsis, unspecified organism: Secondary | ICD-10-CM | POA: Diagnosis not present

## 2022-03-06 DIAGNOSIS — J151 Pneumonia due to Pseudomonas: Secondary | ICD-10-CM | POA: Diagnosis not present

## 2022-03-06 DIAGNOSIS — G1221 Amyotrophic lateral sclerosis: Secondary | ICD-10-CM | POA: Diagnosis not present

## 2022-03-06 LAB — CBC
HCT: 25 % — ABNORMAL LOW (ref 39.0–52.0)
Hemoglobin: 7.8 g/dL — ABNORMAL LOW (ref 13.0–17.0)
MCH: 28.2 pg (ref 26.0–34.0)
MCHC: 31.2 g/dL (ref 30.0–36.0)
MCV: 90.3 fL (ref 80.0–100.0)
Platelets: 154 10*3/uL (ref 150–400)
RBC: 2.77 MIL/uL — ABNORMAL LOW (ref 4.22–5.81)
RDW: 14.8 % (ref 11.5–15.5)
WBC: 7 10*3/uL (ref 4.0–10.5)
nRBC: 0 % (ref 0.0–0.2)

## 2022-03-06 LAB — BASIC METABOLIC PANEL
Anion gap: 7 (ref 5–15)
BUN: 16 mg/dL (ref 6–20)
CO2: 23 mmol/L (ref 22–32)
Calcium: 8.7 mg/dL — ABNORMAL LOW (ref 8.9–10.3)
Chloride: 107 mmol/L (ref 98–111)
Creatinine, Ser: <0.3 mg/dL — ABNORMAL LOW (ref 0.61–1.24)
Glucose, Bld: 108 mg/dL — ABNORMAL HIGH (ref 70–99)
Potassium: 3.5 mmol/L (ref 3.5–5.1)
Sodium: 137 mmol/L (ref 135–145)

## 2022-03-06 LAB — CULTURE, RESPIRATORY W GRAM STAIN

## 2022-03-06 LAB — MAGNESIUM: Magnesium: 1.9 mg/dL (ref 1.7–2.4)

## 2022-03-06 NOTE — Progress Notes (Signed)
Pulmonary Critical Care Medicine [place]   PULMONARY CRITICAL CARE SERVICE  PROGRESS NOTE     Jerry [name]  OQH:[id number]  DOB: October 26, 1970   DOA: 01/08/2022  Referring Physician: Satira Sark, MD  HPI: Jerry [name] is a 51 y.o. male being followed for ventilator/airway/oxygen weaning Acute on Chronic Respiratory Failure.  Patient is currently on the ventilator and full support family being trained for home care  Medications: Reviewed on Rounds  Physical Exam:  Vitals: Temperature is 98.0 pulse of 70 respiratory rate is 30 blood pressure is 103/56 saturations 100%  Ventilator Settings on assist control FiO2 is 28% tidal volume 500 PEEP of 5  General: Comfortable at this time Neck: supple Cardiovascular: no malignant arrhythmias Respiratory: No rhonchi no rales are noted at this time Skin: no rash seen on limited exam Musculoskeletal: No gross abnormality Psychiatric:unable to assess Neurologic:no involuntary movements         Lab Data:   Basic Metabolic Panel: Recent Labs  Lab 03/01/22 0842 03/02/22 0311 03/04/22 1248 03/05/22 0541 03/05/22 1805 03/06/22 0330  NA 139  --  139  --   --  137  K 3.7  --  3.8  --   --  3.5  CL 108  --  110  --   --  107  CO2 23  --  24  --   --  23  GLUCOSE 112*  --  125*  --   --  108*  BUN 16  --  17  --   --  16  CREATININE <0.30*  --  <0.30*  --   --  <0.30*  CALCIUM 9.2  --  8.9  --   --  8.7*  MG 1.9 2.0  --  1.7 2.0 1.9  PHOS  --  4.0  --  3.4  --   --     ABG: No results for input(s): "PHART", "PCO2ART", "PO2ART", "HCO3", "O2SAT" in the last 168 hours.  Liver Function Tests: No results for input(s): "AST", "ALT", "ALKPHOS", "BILITOT", "PROT", "ALBUMIN" in the last 168 hours. No results for input(s): "LIPASE", "AMYLASE" in the last 168 hours. No results for input(s): "AMMONIA" in the last 168 hours.  CBC: Recent Labs  Lab 03/01/22 0842 03/04/22 1248 03/06/22 0330  WBC 6.4 8.8 7.0   HGB 8.4* 8.4* 7.8*  HCT 27.2* 26.1* 25.0*  MCV 91.3 88.8 90.3  PLT 205 203 154    Cardiac Enzymes: No results for input(s): "CKTOTAL", "CKMB", "CKMBINDEX", "TROPONINI" in the last 168 hours.  BNP (last 3 results) No results for input(s): "BNP" in the last 8760 hours.  ProBNP (last 3 results) No results for input(s): "PROBNP" in the last 8760 hours.  Radiological Exams: DG CHEST PORT 1 VIEW  Result Date: 03/04/2022 CLINICAL DATA:  Elevated temperature EXAM: PORTABLE CHEST 1 VIEW COMPARISON:  02/09/2022 FINDINGS: Tracheostomy in good position. Right arm PICC tip in the SVC. Right jugular Port-A-Cath tip at the cavoatrial junction unchanged. Left lower lobe airspace density unchanged. Elevated left hemidiaphragm compatible with volume loss in the left lower lobe. Mild right lower lobe airspace disease has developed in the interval. No effusion. IMPRESSION: Support lines remain in good position Persistent left lower lobe consolidation unchanged Progression of right lower lobe infiltrate, possible pneumonia. Electronically Signed   By: Franchot Gallo M.D.   On: 03/04/2022 16:39    Assessment/Plan Active Problems:   ALS (amyotrophic lateral sclerosis) (HCC)   Septic shock (HCC)   Pseudomonas pneumonia (  [place])   Autonomic dysfunction   Acute on chronic respiratory failure with hypoxia (HCC)   Acute on chronic respiratory failure hypoxia patient is at baseline on the ventilator and assist control mode we will continue to monitor ALS advanced severe disease Sepsis with shock no change Pseudomonas pneumonia treated we will continue to follow along Autonomic dysfunction overall no change   I have personally seen and evaluated the patient, evaluated laboratory and imaging results, formulated the assessment and plan and placed orders. The Patient requires high complexity decision making with multiple systems involvement.  Rounds were done with the Respiratory Therapy Director and Staff  therapists and discussed with nursing staff also.  Allyne Gee, MD [facility] Pulmonary Critical Care Medicine Sleep Medicine

## 2022-03-07 DIAGNOSIS — G1221 Amyotrophic lateral sclerosis: Secondary | ICD-10-CM | POA: Diagnosis not present

## 2022-03-07 DIAGNOSIS — J9621 Acute and chronic respiratory failure with hypoxia: Secondary | ICD-10-CM | POA: Diagnosis not present

## 2022-03-07 DIAGNOSIS — J151 Pneumonia due to Pseudomonas: Secondary | ICD-10-CM | POA: Diagnosis not present

## 2022-03-07 DIAGNOSIS — A419 Sepsis, unspecified organism: Secondary | ICD-10-CM | POA: Diagnosis not present

## 2022-03-07 DIAGNOSIS — G909 Disorder of the autonomic nervous system, unspecified: Secondary | ICD-10-CM | POA: Diagnosis not present

## 2022-03-07 DIAGNOSIS — R6521 Severe sepsis with septic shock: Secondary | ICD-10-CM | POA: Diagnosis not present

## 2022-03-07 NOTE — Progress Notes (Signed)
Pulmonary Critical Care Medicine [place]   PULMONARY CRITICAL CARE SERVICE  PROGRESS NOTE     Jerry [name]  NOM:[id number]  DOB: 10-27-1970   DOA: 01/08/2022  Referring Physician: Satira Sark, MD  HPI: Jerry [name] is a 51 y.o. male being followed for ventilator/airway/oxygen weaning Acute on Chronic Respiratory Failure.  Patient is currently on the ventilator at home training underway  Medications: Reviewed on Rounds  Physical Exam:  Vitals: Temperature 98.2 pulse of 70 respiratory rate 23 blood pressure is 109/66 saturations 100%  Ventilator Settings on assist control FiO2 is 28% tidal line 500 PEEP of 5  General: Comfortable at this time Neck: supple Cardiovascular: no malignant arrhythmias Respiratory: No rhonchi no rales are noted at this time Skin: no rash seen on limited exam Musculoskeletal: No gross abnormality Psychiatric:unable to assess Neurologic:no involuntary movements         Lab Data:   Basic Metabolic Panel: Recent Labs  Lab 03/01/22 0842 03/02/22 0311 03/04/22 1248 03/05/22 0541 03/05/22 1805 03/06/22 0330  NA 139  --  139  --   --  137  K 3.7  --  3.8  --   --  3.5  CL 108  --  110  --   --  107  CO2 23  --  24  --   --  23  GLUCOSE 112*  --  125*  --   --  108*  BUN 16  --  17  --   --  16  CREATININE <0.30*  --  <0.30*  --   --  <0.30*  CALCIUM 9.2  --  8.9  --   --  8.7*  MG 1.9 2.0  --  1.7 2.0 1.9  PHOS  --  4.0  --  3.4  --   --     ABG: No results for input(s): "PHART", "PCO2ART", "PO2ART", "HCO3", "O2SAT" in the last 168 hours.  Liver Function Tests: No results for input(s): "AST", "ALT", "ALKPHOS", "BILITOT", "PROT", "ALBUMIN" in the last 168 hours. No results for input(s): "LIPASE", "AMYLASE" in the last 168 hours. No results for input(s): "AMMONIA" in the last 168 hours.  CBC: Recent Labs  Lab 03/01/22 0842 03/04/22 1248 03/06/22 0330  WBC 6.4 8.8 7.0  HGB 8.4* 8.4* 7.8*  HCT 27.2* 26.1*  25.0*  MCV 91.3 88.8 90.3  PLT 205 203 154    Cardiac Enzymes: No results for input(s): "CKTOTAL", "CKMB", "CKMBINDEX", "TROPONINI" in the last 168 hours.  BNP (last 3 results) No results for input(s): "BNP" in the last 8760 hours.  ProBNP (last 3 results) No results for input(s): "PROBNP" in the last 8760 hours.  Radiological Exams: No results found.  Assessment/Plan Active Problems:   ALS (amyotrophic lateral sclerosis) (HCC)   Septic shock (HCC)   Pseudomonas pneumonia (HCC)   Autonomic dysfunction   Acute on chronic respiratory failure with hypoxia (HCC)   Acute on chronic respiratory failure with hypoxia continue with home training we will continue to follow along closely. Septic shock resolved Pseudomonas pneumonia has been treated we will continue to follow along Autonomic dysfunction no change ALS supportive care   I have personally seen and evaluated the patient, evaluated laboratory and imaging results, formulated the assessment and plan and placed orders. The Patient requires high complexity decision making with multiple systems involvement.  Rounds were done with the Respiratory Therapy Director and Staff therapists and discussed with nursing staff also.  Allyne Gee, MD [facility] Pulmonary  Critical Care Medicine Sleep Medicine

## 2022-03-08 DIAGNOSIS — G909 Disorder of the autonomic nervous system, unspecified: Secondary | ICD-10-CM | POA: Diagnosis not present

## 2022-03-08 DIAGNOSIS — G1221 Amyotrophic lateral sclerosis: Secondary | ICD-10-CM | POA: Diagnosis not present

## 2022-03-08 DIAGNOSIS — J9621 Acute and chronic respiratory failure with hypoxia: Secondary | ICD-10-CM | POA: Diagnosis not present

## 2022-03-08 DIAGNOSIS — R6521 Severe sepsis with septic shock: Secondary | ICD-10-CM | POA: Diagnosis not present

## 2022-03-08 DIAGNOSIS — A419 Sepsis, unspecified organism: Secondary | ICD-10-CM | POA: Diagnosis not present

## 2022-03-08 DIAGNOSIS — J151 Pneumonia due to Pseudomonas: Secondary | ICD-10-CM | POA: Diagnosis not present

## 2022-03-08 LAB — CBC
HCT: 25 % — ABNORMAL LOW (ref 39.0–52.0)
Hemoglobin: 8 g/dL — ABNORMAL LOW (ref 13.0–17.0)
MCH: 28.4 pg (ref 26.0–34.0)
MCHC: 32 g/dL (ref 30.0–36.0)
MCV: 88.7 fL (ref 80.0–100.0)
Platelets: 199 10*3/uL (ref 150–400)
RBC: 2.82 MIL/uL — ABNORMAL LOW (ref 4.22–5.81)
RDW: 14.8 % (ref 11.5–15.5)
WBC: 5.1 10*3/uL (ref 4.0–10.5)
nRBC: 0 % (ref 0.0–0.2)

## 2022-03-08 LAB — BASIC METABOLIC PANEL
Anion gap: 11 (ref 5–15)
BUN: 17 mg/dL (ref 6–20)
CO2: 21 mmol/L — ABNORMAL LOW (ref 22–32)
Calcium: 9.1 mg/dL (ref 8.9–10.3)
Chloride: 108 mmol/L (ref 98–111)
Creatinine, Ser: <0.3 mg/dL — ABNORMAL LOW (ref 0.61–1.24)
Glucose, Bld: 111 mg/dL — ABNORMAL HIGH (ref 70–99)
Potassium: 3.7 mmol/L (ref 3.5–5.1)
Sodium: 140 mmol/L (ref 135–145)

## 2022-03-08 LAB — PHOSPHORUS: Phosphorus: 3.5 mg/dL (ref 2.5–4.6)

## 2022-03-08 LAB — MAGNESIUM
Magnesium: 1.7 mg/dL (ref 1.7–2.4)
Magnesium: 2 mg/dL (ref 1.7–2.4)

## 2022-03-08 NOTE — Progress Notes (Signed)
Pulmonary Critical Care Medicine [place]   PULMONARY CRITICAL CARE SERVICE  PROGRESS NOTE     Jerry [name]  TML:[id number]  DOB: Mar 15, 1971   DOA: 01/08/2022  Referring Physician: Satira Sark, MD  HPI: Jerry [name] is a 52 y.o. male being followed for ventilator/airway/oxygen weaning Acute on Chronic Respiratory Failure.  Patient currently is on assist control mode has been on 28% FiO2 on the ventilator full support  Medications: Reviewed on Rounds  Physical Exam:  Vitals: Temperature is 97.0 pulse 63 respiratory 26 blood pressure 124/77 saturations 100%  Ventilator Settings on assist control FiO2 28% PEEP 5 tidal volume 500  General: Comfortable at this time Neck: supple Cardiovascular: no malignant arrhythmias Respiratory: No rhonchi very coarse breath sounds Skin: no rash seen on limited exam Musculoskeletal: No gross abnormality Psychiatric:unable to assess Neurologic:no involuntary movements         Lab Data:   Basic Metabolic Panel: Recent Labs  Lab 03/02/22 0311 03/04/22 1248 03/05/22 0541 03/05/22 1805 03/06/22 0330 03/08/22 0235  NA  --  139  --   --  137 140  K  --  3.8  --   --  3.5 3.7  CL  --  110  --   --  107 108  CO2  --  24  --   --  23 21*  GLUCOSE  --  125*  --   --  108* 111*  BUN  --  17  --   --  16 17  CREATININE  --  <0.30*  --   --  <0.30* <0.30*  CALCIUM  --  8.9  --   --  8.7* 9.1  MG 2.0  --  1.7 2.0 1.9 1.7  PHOS 4.0  --  3.4  --   --  3.5    ABG: No results for input(s): "PHART", "PCO2ART", "PO2ART", "HCO3", "O2SAT" in the last 168 hours.  Liver Function Tests: No results for input(s): "AST", "ALT", "ALKPHOS", "BILITOT", "PROT", "ALBUMIN" in the last 168 hours. No results for input(s): "LIPASE", "AMYLASE" in the last 168 hours. No results for input(s): "AMMONIA" in the last 168 hours.  CBC: Recent Labs  Lab 03/04/22 1248 03/06/22 0330 03/08/22 0235  WBC 8.8 7.0 5.1  HGB 8.4* 7.8* 8.0*   HCT 26.1* 25.0* 25.0*  MCV 88.8 90.3 88.7  PLT 203 154 199    Cardiac Enzymes: No results for input(s): "CKTOTAL", "CKMB", "CKMBINDEX", "TROPONINI" in the last 168 hours.  BNP (last 3 results) No results for input(s): "BNP" in the last 8760 hours.  ProBNP (last 3 results) No results for input(s): "PROBNP" in the last 8760 hours.  Radiological Exams: No results found.  Assessment/Plan Active Problems:   ALS (amyotrophic lateral sclerosis) (HCC)   Septic shock (HCC)   Pseudomonas pneumonia (HCC)   Autonomic dysfunction   Acute on chronic respiratory failure with hypoxia (HCC)   Acute on chronic respiratory failure hypoxia plan is going to be to continue with full support on the ventilator patient is at baseline ALS advanced severe end-stage disease Pseudomonas pneumonia has been treated clinically improved Autonomic dysfunction no change Sepsis resolved   I have personally seen and evaluated the patient, evaluated laboratory and imaging results, formulated the assessment and plan and placed orders. The Patient requires high complexity decision making with multiple systems involvement.  Rounds were done with the Respiratory Therapy Director and Staff therapists and discussed with nursing staff also.  Allyne Gee, MD [facility] Pulmonary  Critical Care Medicine Sleep Medicine

## 2022-03-09 ENCOUNTER — Other Ambulatory Visit (HOSPITAL_COMMUNITY): Payer: Self-pay

## 2022-03-09 DIAGNOSIS — Z931 Gastrostomy status: Secondary | ICD-10-CM | POA: Diagnosis not present

## 2022-03-09 DIAGNOSIS — R140 Abdominal distension (gaseous): Secondary | ICD-10-CM | POA: Diagnosis not present

## 2022-03-09 DIAGNOSIS — A419 Sepsis, unspecified organism: Secondary | ICD-10-CM | POA: Diagnosis not present

## 2022-03-09 DIAGNOSIS — R6521 Severe sepsis with septic shock: Secondary | ICD-10-CM | POA: Diagnosis not present

## 2022-03-09 DIAGNOSIS — G1221 Amyotrophic lateral sclerosis: Secondary | ICD-10-CM | POA: Diagnosis not present

## 2022-03-09 DIAGNOSIS — J9621 Acute and chronic respiratory failure with hypoxia: Secondary | ICD-10-CM | POA: Diagnosis not present

## 2022-03-09 DIAGNOSIS — J151 Pneumonia due to Pseudomonas: Secondary | ICD-10-CM | POA: Diagnosis not present

## 2022-03-09 DIAGNOSIS — G909 Disorder of the autonomic nervous system, unspecified: Secondary | ICD-10-CM | POA: Diagnosis not present

## 2022-03-09 LAB — BASIC METABOLIC PANEL
Anion gap: 7 (ref 5–15)
BUN: 18 mg/dL (ref 6–20)
CO2: 20 mmol/L — ABNORMAL LOW (ref 22–32)
Calcium: 9.1 mg/dL (ref 8.9–10.3)
Chloride: 110 mmol/L (ref 98–111)
Creatinine, Ser: <0.3 mg/dL — ABNORMAL LOW (ref 0.61–1.24)
Glucose, Bld: 105 mg/dL — ABNORMAL HIGH (ref 70–99)
Potassium: 3.6 mmol/L (ref 3.5–5.1)
Sodium: 137 mmol/L (ref 135–145)

## 2022-03-09 LAB — CBC
HCT: 30.4 % — ABNORMAL LOW (ref 39.0–52.0)
Hemoglobin: 9.3 g/dL — ABNORMAL LOW (ref 13.0–17.0)
MCH: 27.8 pg (ref 26.0–34.0)
MCHC: 30.6 g/dL (ref 30.0–36.0)
MCV: 90.7 fL (ref 80.0–100.0)
Platelets: 193 10*3/uL (ref 150–400)
RBC: 3.35 MIL/uL — ABNORMAL LOW (ref 4.22–5.81)
RDW: 15 % (ref 11.5–15.5)
WBC: 6.8 10*3/uL (ref 4.0–10.5)
nRBC: 0 % (ref 0.0–0.2)

## 2022-03-09 LAB — MAGNESIUM: Magnesium: 1.9 mg/dL (ref 1.7–2.4)

## 2022-03-09 NOTE — Progress Notes (Signed)
Pulmonary Critical Care Medicine [place]   PULMONARY CRITICAL CARE SERVICE  PROGRESS NOTE     Jerry [name]  FUX:[id number]  DOB: 1971/02/25   DOA: 01/08/2022  Referring Physician: Satira Sark, MD  HPI: Jerry [name] is a 51 y.o. male being followed for ventilator/airway/oxygen weaning Acute on Chronic Respiratory Failure.  Patient is on assist control has been on 28% FiO2 with tidal volume of 500  Medications: Reviewed on Rounds  Physical Exam:  Vitals: Temperature 98.1 pulse 74 respiratory 25 blood pressure 128/77 saturations 98%  Ventilator Settings assist-control FiO2 28% tidal volume 500 PEEP of 5  General: Comfortable at this time Neck: supple Cardiovascular: no malignant arrhythmias Respiratory: No rhonchi no rales Skin: no rash seen on limited exam Musculoskeletal: No gross abnormality Psychiatric:unable to assess Neurologic:no involuntary movements         Lab Data:   Basic Metabolic Panel: Recent Labs  Lab 03/04/22 1248 03/05/22 0541 03/05/22 0541 03/05/22 1805 03/06/22 0330 03/08/22 0235 03/08/22 1517 03/09/22 0411  NA 139  --   --   --  137 140  --  137  K 3.8  --   --   --  3.5 3.7  --  3.6  CL 110  --   --   --  107 108  --  110  CO2 24  --   --   --  23 21*  --  20*  GLUCOSE 125*  --   --   --  108* 111*  --  105*  BUN 17  --   --   --  16 17  --  18  CREATININE <0.30*  --   --   --  <0.30* <0.30*  --  <0.30*  CALCIUM 8.9  --   --   --  8.7* 9.1  --  9.1  MG  --  1.7   < > 2.0 1.9 1.7 2.0 1.9  PHOS  --  3.4  --   --   --  3.5  --   --    < > = values in this interval not displayed.    ABG: No results for input(s): "PHART", "PCO2ART", "PO2ART", "HCO3", "O2SAT" in the last 168 hours.  Liver Function Tests: No results for input(s): "AST", "ALT", "ALKPHOS", "BILITOT", "PROT", "ALBUMIN" in the last 168 hours. No results for input(s): "LIPASE", "AMYLASE" in the last 168 hours. No results for input(s): "AMMONIA" in the  last 168 hours.  CBC: Recent Labs  Lab 03/04/22 1248 03/06/22 0330 03/08/22 0235 03/09/22 0411  WBC 8.8 7.0 5.1 6.8  HGB 8.4* 7.8* 8.0* 9.3*  HCT 26.1* 25.0* 25.0* 30.4*  MCV 88.8 90.3 88.7 90.7  PLT 203 154 199 193    Cardiac Enzymes: No results for input(s): "CKTOTAL", "CKMB", "CKMBINDEX", "TROPONINI" in the last 168 hours.  BNP (last 3 results) No results for input(s): "BNP" in the last 8760 hours.  ProBNP (last 3 results) No results for input(s): "PROBNP" in the last 8760 hours.  Radiological Exams: No results found.  Assessment/Plan Active Problems:   ALS (amyotrophic lateral sclerosis) (HCC)   Septic shock (HCC)   Pseudomonas pneumonia (HCC)   Autonomic dysfunction   Acute on chronic respiratory failure with hypoxia (HCC)   Acute on chronic respiratory failure hypoxia patient is at baseline on the ventilator full support ALS advanced severe disease supportive care with Sepsis with shock treated resolved Pseudomonas pneumonia treated Autonomic dysfunction supportive care   I have personally  seen and evaluated the patient, evaluated laboratory and imaging results, formulated the assessment and plan and placed orders. The Patient requires high complexity decision making with multiple systems involvement.  Rounds were done with the Respiratory Therapy Director and Staff therapists and discussed with nursing staff also.  Allyne Gee, MD [facility] Pulmonary Critical Care Medicine Sleep Medicine

## 2022-03-10 DIAGNOSIS — R6521 Severe sepsis with septic shock: Secondary | ICD-10-CM | POA: Diagnosis not present

## 2022-03-10 DIAGNOSIS — J151 Pneumonia due to Pseudomonas: Secondary | ICD-10-CM | POA: Diagnosis not present

## 2022-03-10 DIAGNOSIS — G909 Disorder of the autonomic nervous system, unspecified: Secondary | ICD-10-CM | POA: Diagnosis not present

## 2022-03-10 DIAGNOSIS — A419 Sepsis, unspecified organism: Secondary | ICD-10-CM | POA: Diagnosis not present

## 2022-03-10 DIAGNOSIS — G1221 Amyotrophic lateral sclerosis: Secondary | ICD-10-CM | POA: Diagnosis not present

## 2022-03-10 DIAGNOSIS — J9621 Acute and chronic respiratory failure with hypoxia: Secondary | ICD-10-CM | POA: Diagnosis not present

## 2022-03-10 LAB — TRIGLYCERIDES: Triglycerides: <10 mg/dL (ref <150)

## 2022-03-10 NOTE — Progress Notes (Signed)
Pulmonary Critical Care Medicine [name]   PULMONARY CRITICAL CARE SERVICE  PROGRESS NOTE     Jerry [name]  PYP:[id number]  DOB: 03-04-1971   DOA: 01/08/2022  Referring Physician: Satira Sark, MD  HPI: Jerry [name] is a 51 y.o. male being followed for ventilator/airway/oxygen weaning Acute on Chronic Respiratory Failure.  Patient is on assist control currently on 28% FiO2 saturations are good  Medications: Reviewed on Rounds  Physical Exam:  Vitals: Temperature is 97.1 pulse of 70 respiratory rate is 22 blood pressure 158/70 saturations 100%  Ventilator Settings assist-control FiO2 is 28% tidal volume 500 PEEP of 5  General: Comfortable at this time Neck: supple Cardiovascular: no malignant arrhythmias Respiratory: Scattered rhonchi very coarse breath sounds Skin: no rash seen on limited exam Musculoskeletal: No gross abnormality Psychiatric:unable to assess Neurologic:no involuntary movements         Lab Data:   Basic Metabolic Panel: Recent Labs  Lab 03/04/22 1248 03/05/22 0541 03/05/22 0541 03/05/22 1805 03/06/22 0330 03/08/22 0235 03/08/22 1517 03/09/22 0411  NA 139  --   --   --  137 140  --  137  K 3.8  --   --   --  3.5 3.7  --  3.6  CL 110  --   --   --  107 108  --  110  CO2 24  --   --   --  23 21*  --  20*  GLUCOSE 125*  --   --   --  108* 111*  --  105*  BUN 17  --   --   --  16 17  --  18  CREATININE <0.30*  --   --   --  <0.30* <0.30*  --  <0.30*  CALCIUM 8.9  --   --   --  8.7* 9.1  --  9.1  MG  --  1.7   < > 2.0 1.9 1.7 2.0 1.9  PHOS  --  3.4  --   --   --  3.5  --   --    < > = values in this interval not displayed.    ABG: No results for input(s): "PHART", "PCO2ART", "PO2ART", "HCO3", "O2SAT" in the last 168 hours.  Liver Function Tests: No results for input(s): "AST", "ALT", "ALKPHOS", "BILITOT", "PROT", "ALBUMIN" in the last 168 hours. No results for input(s): "LIPASE", "AMYLASE" in the last 168 hours. No  results for input(s): "AMMONIA" in the last 168 hours.  CBC: Recent Labs  Lab 03/04/22 1248 03/06/22 0330 03/08/22 0235 03/09/22 0411  WBC 8.8 7.0 5.1 6.8  HGB 8.4* 7.8* 8.0* 9.3*  HCT 26.1* 25.0* 25.0* 30.4*  MCV 88.8 90.3 88.7 90.7  PLT 203 154 199 193    Cardiac Enzymes: No results for input(s): "CKTOTAL", "CKMB", "CKMBINDEX", "TROPONINI" in the last 168 hours.  BNP (last 3 results) No results for input(s): "BNP" in the last 8760 hours.  ProBNP (last 3 results) No results for input(s): "PROBNP" in the last 8760 hours.  Radiological Exams: DG Abd 1 View  Result Date: 03/09/2022 CLINICAL DATA:  Abdominal distention EXAM: ABDOMEN - 1 VIEW COMPARISON:  03/03/2022 FINDINGS: Gastrostomy tube is noted in place. There is moderate amount of gas in small bowel loops and colon. There is interval decrease in amount of small bowel gas. There is no fecal impaction in rectum. Linear densities are seen in left lower lung field suggesting scarring or subsegmental atelectasis. IMPRESSION: Presence of gas  in small bowel loops and colon suggest ileus. There is slight interval decrease in amount of small bowel gas. Electronically Signed   By: Elmer Picker M.D.   On: 03/09/2022 16:40    Assessment/Plan Active Problems:   ALS (amyotrophic lateral sclerosis) (HCC)   Septic shock (HCC)   Pseudomonas pneumonia (HCC)   Autonomic dysfunction   Acute on chronic respiratory failure with hypoxia (HCC)   Acute on chronic respiratory failure hypoxia we will continue with full support patient is on baseline settings at home training underway ALS severe advanced disease continue with supportive care Sepsis with shock no change this has resolved Pseudomonas resolved Autonomic dysfunction at baseline right now   I have personally seen and evaluated the patient, evaluated laboratory and imaging results, formulated the assessment and plan and placed orders. The Patient requires high complexity  decision making with multiple systems involvement.  Rounds were done with the Respiratory Therapy Director and Staff therapists and discussed with nursing staff also.  Allyne Gee, MD [facility] Pulmonary Critical Care Medicine Sleep Medicine

## 2022-03-11 DIAGNOSIS — J9621 Acute and chronic respiratory failure with hypoxia: Secondary | ICD-10-CM | POA: Diagnosis not present

## 2022-03-11 DIAGNOSIS — G1221 Amyotrophic lateral sclerosis: Secondary | ICD-10-CM | POA: Diagnosis not present

## 2022-03-11 DIAGNOSIS — R6521 Severe sepsis with septic shock: Secondary | ICD-10-CM | POA: Diagnosis not present

## 2022-03-11 DIAGNOSIS — A419 Sepsis, unspecified organism: Secondary | ICD-10-CM | POA: Diagnosis not present

## 2022-03-11 DIAGNOSIS — G909 Disorder of the autonomic nervous system, unspecified: Secondary | ICD-10-CM | POA: Diagnosis not present

## 2022-03-11 DIAGNOSIS — J151 Pneumonia due to Pseudomonas: Secondary | ICD-10-CM | POA: Diagnosis not present

## 2022-03-11 LAB — MAGNESIUM: Magnesium: 1.7 mg/dL (ref 1.7–2.4)

## 2022-03-11 LAB — PHOSPHORUS: Phosphorus: 3.2 mg/dL (ref 2.5–4.6)

## 2022-03-11 NOTE — Progress Notes (Cosign Needed)
Pulmonary Critical Care Medicine [place]   PULMONARY CRITICAL CARE SERVICE  PROGRESS NOTE     Jerry [name]  KZS:[id number]  DOB: [birth year]   DOA: 01/08/2022  Referring Physician: Satira Sark, MD  HPI: Jerry [name] is a 51 y.o. male being followed for ventilator/airway/oxygen weaning Acute on Chronic Respiratory Failure.  Patient seen lying in bed, currently on baseline of full support.  Continues to be treated for unresolved ileus.  No obvious acute distress.  Medications: Reviewed on Rounds  Physical Exam:  Vitals: Temp 97.0, pulse 46, respirations 17, BP 106/65, SPO2 100%  Ventilator Settings AC VC, FiO2 20%, tidal volume 500, rate 16, PEEP 5  General: Comfortable at this time Neck: supple Cardiovascular: no malignant arrhythmias Respiratory: Bilaterally coarse Skin: no rash seen on limited exam Musculoskeletal: No gross abnormality Psychiatric:unable to assess Neurologic:no involuntary movements         Lab Data:   Basic Metabolic Panel: Recent Labs  Lab 03/05/22 0541 03/05/22 1805 03/06/22 0330 03/08/22 0235 03/08/22 1517 03/09/22 0411 03/11/22 0637  NA  --   --  137 140  --  137  --   K  --   --  3.5 3.7  --  3.6  --   CL  --   --  107 108  --  110  --   CO2  --   --  23 21*  --  20*  --   GLUCOSE  --   --  108* 111*  --  105*  --   BUN  --   --  16 17  --  18  --   CREATININE  --   --  <0.30* <0.30*  --  <0.30*  --   CALCIUM  --   --  8.7* 9.1  --  9.1  --   MG 1.7   < > 1.9 1.7 2.0 1.9 1.7  PHOS 3.4  --   --  3.5  --   --  3.2   < > = values in this interval not displayed.    ABG: No results for input(s): "PHART", "PCO2ART", "PO2ART", "HCO3", "O2SAT" in the last 168 hours.  Liver Function Tests: No results for input(s): "AST", "ALT", "ALKPHOS", "BILITOT", "PROT", "ALBUMIN" in the last 168 hours. No results for input(s): "LIPASE", "AMYLASE" in the last 168 hours. No results for input(s): "AMMONIA" in the last 168  hours.  CBC: Recent Labs  Lab 03/06/22 0330 03/08/22 0235 03/09/22 0411  WBC 7.0 5.1 6.8  HGB 7.8* 8.0* 9.3*  HCT 25.0* 25.0* 30.4*  MCV 90.3 88.7 90.7  PLT 154 199 193    Cardiac Enzymes: No results for input(s): "CKTOTAL", "CKMB", "CKMBINDEX", "TROPONINI" in the last 168 hours.  BNP (last 3 results) No results for input(s): "BNP" in the last 8760 hours.  ProBNP (last 3 results) No results for input(s): "PROBNP" in the last 8760 hours.  Radiological Exams: No results found.  Assessment/Plan Active Problems:   ALS (amyotrophic lateral sclerosis) (HCC)   Septic shock (HCC)   Pseudomonas pneumonia (HCC)   Autonomic dysfunction   Acute on chronic respiratory failure with hypoxia (HCC)   Acute on chronic respiratory failure hypoxia-remains on baseline of full support.  Continue with pulmonary toilet. ALS severe advanced disease-continue with supportive care. Pseudomonas- resolved.  Continue with supportive care. Autonomic dysfunction- at baseline right now.  I have personally seen and evaluated the patient, evaluated laboratory and imaging results, formulated the assessment and plan and  placed orders. The Patient requires high complexity decision making with multiple systems involvement.  Rounds were done with the Respiratory Therapy Director and Staff therapists and discussed with nursing staff also.  Allyne Gee, MD [facility] Pulmonary Critical Care Medicine Sleep Medicine

## 2022-03-12 ENCOUNTER — Other Ambulatory Visit (HOSPITAL_COMMUNITY): Payer: Self-pay

## 2022-03-12 DIAGNOSIS — G1221 Amyotrophic lateral sclerosis: Secondary | ICD-10-CM | POA: Diagnosis not present

## 2022-03-12 DIAGNOSIS — K6389 Other specified diseases of intestine: Secondary | ICD-10-CM | POA: Diagnosis not present

## 2022-03-12 DIAGNOSIS — A419 Sepsis, unspecified organism: Secondary | ICD-10-CM | POA: Diagnosis not present

## 2022-03-12 DIAGNOSIS — K567 Ileus, unspecified: Secondary | ICD-10-CM | POA: Diagnosis not present

## 2022-03-12 DIAGNOSIS — R6521 Severe sepsis with septic shock: Secondary | ICD-10-CM | POA: Diagnosis not present

## 2022-03-12 DIAGNOSIS — J151 Pneumonia due to Pseudomonas: Secondary | ICD-10-CM | POA: Diagnosis not present

## 2022-03-12 DIAGNOSIS — J9621 Acute and chronic respiratory failure with hypoxia: Secondary | ICD-10-CM | POA: Diagnosis not present

## 2022-03-12 DIAGNOSIS — G909 Disorder of the autonomic nervous system, unspecified: Secondary | ICD-10-CM | POA: Diagnosis not present

## 2022-03-12 LAB — BASIC METABOLIC PANEL
Anion gap: 9 (ref 5–15)
BUN: 16 mg/dL (ref 6–20)
CO2: 22 mmol/L (ref 22–32)
Calcium: 9.4 mg/dL (ref 8.9–10.3)
Chloride: 107 mmol/L (ref 98–111)
Creatinine, Ser: <0.3 mg/dL — ABNORMAL LOW (ref 0.61–1.24)
Glucose, Bld: 122 mg/dL — ABNORMAL HIGH (ref 70–99)
Potassium: 3.8 mmol/L (ref 3.5–5.1)
Sodium: 138 mmol/L (ref 135–145)

## 2022-03-12 LAB — CBC
HCT: 27.7 % — ABNORMAL LOW (ref 39.0–52.0)
Hemoglobin: 8.6 g/dL — ABNORMAL LOW (ref 13.0–17.0)
MCH: 27.7 pg (ref 26.0–34.0)
MCHC: 31 g/dL (ref 30.0–36.0)
MCV: 89.4 fL (ref 80.0–100.0)
Platelets: 169 10*3/uL (ref 150–400)
RBC: 3.1 MIL/uL — ABNORMAL LOW (ref 4.22–5.81)
RDW: 15.3 % (ref 11.5–15.5)
WBC: 7.5 10*3/uL (ref 4.0–10.5)
nRBC: 0 % (ref 0.0–0.2)

## 2022-03-12 LAB — MAGNESIUM
Magnesium: 1.7 mg/dL (ref 1.7–2.4)
Magnesium: 2.1 mg/dL (ref 1.7–2.4)

## 2022-03-13 DIAGNOSIS — J151 Pneumonia due to Pseudomonas: Secondary | ICD-10-CM | POA: Diagnosis not present

## 2022-03-13 DIAGNOSIS — R6521 Severe sepsis with septic shock: Secondary | ICD-10-CM | POA: Diagnosis not present

## 2022-03-13 DIAGNOSIS — A419 Sepsis, unspecified organism: Secondary | ICD-10-CM | POA: Diagnosis not present

## 2022-03-13 DIAGNOSIS — G909 Disorder of the autonomic nervous system, unspecified: Secondary | ICD-10-CM | POA: Diagnosis not present

## 2022-03-13 DIAGNOSIS — G1221 Amyotrophic lateral sclerosis: Secondary | ICD-10-CM | POA: Diagnosis not present

## 2022-03-13 DIAGNOSIS — J9621 Acute and chronic respiratory failure with hypoxia: Secondary | ICD-10-CM | POA: Diagnosis not present

## 2022-03-14 DIAGNOSIS — J9621 Acute and chronic respiratory failure with hypoxia: Secondary | ICD-10-CM | POA: Diagnosis not present

## 2022-03-14 DIAGNOSIS — A419 Sepsis, unspecified organism: Secondary | ICD-10-CM | POA: Diagnosis not present

## 2022-03-14 DIAGNOSIS — G909 Disorder of the autonomic nervous system, unspecified: Secondary | ICD-10-CM | POA: Diagnosis not present

## 2022-03-14 DIAGNOSIS — J151 Pneumonia due to Pseudomonas: Secondary | ICD-10-CM | POA: Diagnosis not present

## 2022-03-14 DIAGNOSIS — R6521 Severe sepsis with septic shock: Secondary | ICD-10-CM | POA: Diagnosis not present

## 2022-03-14 DIAGNOSIS — G1221 Amyotrophic lateral sclerosis: Secondary | ICD-10-CM | POA: Diagnosis not present

## 2022-03-14 LAB — PHOSPHORUS: Phosphorus: 3.4 mg/dL (ref 2.5–4.6)

## 2022-03-14 LAB — MAGNESIUM: Magnesium: 1.8 mg/dL (ref 1.7–2.4)

## 2022-03-14 NOTE — Progress Notes (Cosign Needed)
Pulmonary Critical Care Medicine [place]   PULMONARY CRITICAL CARE SERVICE  PROGRESS NOTE     Sahas Sluka  URK:[id number]  DOB: 1971-02-27   DOA: 01/08/2022  Referring Physician: Satira Sark, MD  HPI: Karel Turpen is a 51 y.o. male being followed for ventilator/airway/oxygen weaning Acute on Chronic Respiratory Failure.  Patient seen lying in bed, remains on his full support ventilation.  Respiratory continues to work with family for trach education.  Medications: Reviewed on Rounds  Physical Exam:  Vitals: Temp 97.9, pulse 75, respirations 19, BP 175/70, SPO2 100%  Ventilator Settings AC VC, FiO2 28%, tidal volume 500, rate 16, PEEP 5    General: Comfortable at this time Neck: supple Cardiovascular: no malignant arrhythmias Respiratory: Bilaterally diminished Skin: no rash seen on limited exam Musculoskeletal: No gross abnormality Psychiatric:unable to assess Neurologic:no involuntary movements         Lab Data:   Basic Metabolic Panel: Recent Labs  Lab 03/08/22 0235 03/08/22 1517 03/09/22 0411 03/11/22 8315 03/12/22 0602 03/12/22 1938 03/14/22 0312  NA 140  --  137  --  138  --   --   K 3.7  --  3.6  --  3.8  --   --   CL 108  --  110  --  107  --   --   CO2 21*  --  20*  --  22  --   --   GLUCOSE 111*  --  105*  --  122*  --   --   BUN 17  --  18  --  16  --   --   CREATININE <0.30*  --  <0.30*  --  <0.30*  --   --   CALCIUM 9.1  --  9.1  --  9.4  --   --   MG 1.7   < > 1.9 1.7 1.7 2.1 1.8  PHOS 3.5  --   --  3.2  --   --  3.4   < > = values in this interval not displayed.    ABG: No results for input(s): "PHART", "PCO2ART", "PO2ART", "HCO3", "O2SAT" in the last 168 hours.  Liver Function Tests: No results for input(s): "AST", "ALT", "ALKPHOS", "BILITOT", "PROT", "ALBUMIN" in the last 168 hours. No results for input(s): "LIPASE", "AMYLASE" in the last 168 hours. No results for input(s): "AMMONIA" in the last 168  hours.  CBC: Recent Labs  Lab 03/08/22 0235 03/09/22 0411 03/12/22 0602  WBC 5.1 6.8 7.5  HGB 8.0* 9.3* 8.6*  HCT 25.0* 30.4* 27.7*  MCV 88.7 90.7 89.4  PLT 199 193 169    Cardiac Enzymes: No results for input(s): "CKTOTAL", "CKMB", "CKMBINDEX", "TROPONINI" in the last 168 hours.  BNP (last 3 results) No results for input(s): "BNP" in the last 8760 hours.  ProBNP (last 3 results) No results for input(s): "PROBNP" in the last 8760 hours.  Radiological Exams: No results found.  Assessment/Plan Active Problems:   ALS (amyotrophic lateral sclerosis) (HCC)   Septic shock (HCC)   Pseudomonas pneumonia (HCC)   Autonomic dysfunction   Acute on chronic respiratory failure with hypoxia (HCC)   Acute on chronic respiratory failure hypoxia-remains on baseline of full support.  Continue with pulmonary toilet. Status post tracheostomy-remains in place and is stable.  Continue trach care per protocol.  No plans for decannulation. ALS severe advanced disease-continue with supportive care. Pseudomonas- resolved.  Continue with supportive care. Autonomic dysfunction- at baseline right now.  I have personally seen and evaluated the patient, evaluated laboratory and imaging results, formulated the assessment and plan and placed orders. The Patient requires high complexity decision making with multiple systems involvement.  Rounds were done with the Respiratory Therapy Director and Staff therapists and discussed with nursing staff also.  Allyne Gee, MD [facility] Pulmonary Critical Care Medicine Sleep Medicine

## 2022-03-14 NOTE — Progress Notes (Cosign Needed)
Pulmonary Critical Care Medicine [place]   PULMONARY CRITICAL CARE SERVICE  PROGRESS NOTE     Jachob Mcclean  IOX:[id number]  DOB: 24-Feb-1971   DOA: 01/08/2022  Referring Physician: Satira Sark, MD  HPI: Jerry [name] is a 51 y.o. male being followed for ventilator/airway/oxygen weaning Acute on Chronic Respiratory Failure.  Patient seen lying in bed, currently remains on his baseline support with mechanical ventilation.  No acute distress noted.  Medications: Reviewed on Rounds  Physical Exam:  Vitals: Temp 98.1, pulse 75, respirations 15, BP 107/57, SPO2 99%  Ventilator Settings AC VC, FiO2 28%, tidal volume 500, rate 16, PEEP 5  General: Comfortable at this time Neck: supple Cardiovascular: no malignant arrhythmias Respiratory: Bilaterally diminished Skin: no rash seen on limited exam Musculoskeletal: No gross abnormality Psychiatric:unable to assess Neurologic:no involuntary movements         Lab Data:   Basic Metabolic Panel: Recent Labs  Lab 03/08/22 0235 03/08/22 1517 03/09/22 0411 03/11/22 2683 03/12/22 0602 03/12/22 1938 03/14/22 0312  NA 140  --  137  --  138  --   --   K 3.7  --  3.6  --  3.8  --   --   CL 108  --  110  --  107  --   --   CO2 21*  --  20*  --  22  --   --   GLUCOSE 111*  --  105*  --  122*  --   --   BUN 17  --  18  --  16  --   --   CREATININE <0.30*  --  <0.30*  --  <0.30*  --   --   CALCIUM 9.1  --  9.1  --  9.4  --   --   MG 1.7   < > 1.9 1.7 1.7 2.1 1.8  PHOS 3.5  --   --  3.2  --   --  3.4   < > = values in this interval not displayed.    ABG: No results for input(s): "PHART", "PCO2ART", "PO2ART", "HCO3", "O2SAT" in the last 168 hours.  Liver Function Tests: No results for input(s): "AST", "ALT", "ALKPHOS", "BILITOT", "PROT", "ALBUMIN" in the last 168 hours. No results for input(s): "LIPASE", "AMYLASE" in the last 168 hours. No results for input(s): "AMMONIA" in the last 168  hours.  CBC: Recent Labs  Lab 03/08/22 0235 03/09/22 0411 03/12/22 0602  WBC 5.1 6.8 7.5  HGB 8.0* 9.3* 8.6*  HCT 25.0* 30.4* 27.7*  MCV 88.7 90.7 89.4  PLT 199 193 169    Cardiac Enzymes: No results for input(s): "CKTOTAL", "CKMB", "CKMBINDEX", "TROPONINI" in the last 168 hours.  BNP (last 3 results) No results for input(s): "BNP" in the last 8760 hours.  ProBNP (last 3 results) No results for input(s): "PROBNP" in the last 8760 hours.  Radiological Exams: No results found.  Assessment/Plan Active Problems:   ALS (amyotrophic lateral sclerosis) (HCC)   Septic shock (HCC)   Pseudomonas pneumonia (HCC)   Autonomic dysfunction   Acute on chronic respiratory failure with hypoxia (HCC)   Acute on chronic respiratory failure hypoxia-remains on baseline of full support.  Continue with pulmonary toilet. Status post tracheostomy-remains in place and is stable.  Continue trach care per protocol.  No plans for decannulation. ALS severe advanced disease-continue with supportive care. Pseudomonas- resolved.  Continue with supportive care. Autonomic dysfunction- at baseline right now.   I have personally seen  and evaluated the patient, evaluated laboratory and imaging results, formulated the assessment and plan and placed orders. The Patient requires high complexity decision making with multiple systems involvement.  Rounds were done with the Respiratory Therapy Director and Staff therapists and discussed with nursing staff also.  Allyne Gee, MD [facility] Pulmonary Critical Care Medicine Sleep Medicine

## 2022-03-14 NOTE — Progress Notes (Cosign Needed)
Pulmonary Critical Care Medicine [place]   PULMONARY CRITICAL CARE SERVICE  PROGRESS NOTE     Jerry [name]  JIR:[id number]  DOB: Jul 24, 1970   DOA: 01/08/2022  Referring Physician: Satira Sark, MD  HPI: Jerry [name] is a 51 y.o. male being followed for ventilator/airway/oxygen weaning Acute on Chronic Respiratory Failure.  Patient seen lying in bed, currently remains on his full support ventilation which is his baseline.  Family was in today, they do have a time set up for continuous education with respiratory today.  Medications: Reviewed on Rounds  Physical Exam:  Vitals: Temp 97.8, pulse 59, respirations 18, BP 94/59, SPO2 100%  Ventilator Settings AC VC, FiO2 28%, tidal volume 500, rate 16, PEEP 5    General: Comfortable at this time Neck: supple Cardiovascular: no malignant arrhythmias Respiratory: Bilaterally diminished Skin: no rash seen on limited exam Musculoskeletal: No gross abnormality Psychiatric:unable to assess Neurologic:no involuntary movements         Lab Data:   Basic Metabolic Panel: Recent Labs  Lab 03/08/22 0235 03/08/22 1517 03/09/22 0411 03/11/22 7510 03/12/22 0602 03/12/22 1938 03/14/22 0312  NA 140  --  137  --  138  --   --   K 3.7  --  3.6  --  3.8  --   --   CL 108  --  110  --  107  --   --   CO2 21*  --  20*  --  22  --   --   GLUCOSE 111*  --  105*  --  122*  --   --   BUN 17  --  18  --  16  --   --   CREATININE <0.30*  --  <0.30*  --  <0.30*  --   --   CALCIUM 9.1  --  9.1  --  9.4  --   --   MG 1.7   < > 1.9 1.7 1.7 2.1 1.8  PHOS 3.5  --   --  3.2  --   --  3.4   < > = values in this interval not displayed.    ABG: No results for input(s): "PHART", "PCO2ART", "PO2ART", "HCO3", "O2SAT" in the last 168 hours.  Liver Function Tests: No results for input(s): "AST", "ALT", "ALKPHOS", "BILITOT", "PROT", "ALBUMIN" in the last 168 hours. No results for input(s): "LIPASE", "AMYLASE" in the last 168  hours. No results for input(s): "AMMONIA" in the last 168 hours.  CBC: Recent Labs  Lab 03/08/22 0235 03/09/22 0411 03/12/22 0602  WBC 5.1 6.8 7.5  HGB 8.0* 9.3* 8.6*  HCT 25.0* 30.4* 27.7*  MCV 88.7 90.7 89.4  PLT 199 193 169    Cardiac Enzymes: No results for input(s): "CKTOTAL", "CKMB", "CKMBINDEX", "TROPONINI" in the last 168 hours.  BNP (last 3 results) No results for input(s): "BNP" in the last 8760 hours.  ProBNP (last 3 results) No results for input(s): "PROBNP" in the last 8760 hours.  Radiological Exams: No results found.  Assessment/Plan Active Problems:   ALS (amyotrophic lateral sclerosis) (HCC)   Septic shock (HCC)   Pseudomonas pneumonia (HCC)   Autonomic dysfunction   Acute on chronic respiratory failure with hypoxia (HCC)  Acute on chronic respiratory failure hypoxia-remains on baseline of full support.  Continue with pulmonary toilet. Status post tracheostomy-remains in place and is stable.  Continue trach care per protocol.  No plans for decannulation. ALS severe advanced disease-continue with supportive care. Pseudomonas- resolved.  Continue with supportive care. Autonomic dysfunction- at baseline right now.  I have personally seen and evaluated the patient, evaluated laboratory and imaging results, formulated the assessment and plan and placed orders. The Patient requires high complexity decision making with multiple systems involvement.  Rounds were done with the Respiratory Therapy Director and Staff therapists and discussed with nursing staff also.  Allyne Gee, MD [facility] Pulmonary Critical Care Medicine Sleep Medicine

## 2022-03-15 ENCOUNTER — Other Ambulatory Visit (HOSPITAL_COMMUNITY): Payer: Self-pay

## 2022-03-15 DIAGNOSIS — K6389 Other specified diseases of intestine: Secondary | ICD-10-CM | POA: Diagnosis not present

## 2022-03-15 DIAGNOSIS — J151 Pneumonia due to Pseudomonas: Secondary | ICD-10-CM | POA: Diagnosis not present

## 2022-03-15 DIAGNOSIS — K567 Ileus, unspecified: Secondary | ICD-10-CM | POA: Diagnosis not present

## 2022-03-15 DIAGNOSIS — A419 Sepsis, unspecified organism: Secondary | ICD-10-CM | POA: Diagnosis not present

## 2022-03-15 DIAGNOSIS — Z931 Gastrostomy status: Secondary | ICD-10-CM | POA: Diagnosis not present

## 2022-03-15 DIAGNOSIS — R6521 Severe sepsis with septic shock: Secondary | ICD-10-CM | POA: Diagnosis not present

## 2022-03-15 DIAGNOSIS — G1221 Amyotrophic lateral sclerosis: Secondary | ICD-10-CM | POA: Diagnosis not present

## 2022-03-15 DIAGNOSIS — J9621 Acute and chronic respiratory failure with hypoxia: Secondary | ICD-10-CM | POA: Diagnosis not present

## 2022-03-15 DIAGNOSIS — G909 Disorder of the autonomic nervous system, unspecified: Secondary | ICD-10-CM | POA: Diagnosis not present

## 2022-03-15 LAB — BASIC METABOLIC PANEL
Anion gap: 9 (ref 5–15)
BUN: 22 mg/dL — ABNORMAL HIGH (ref 6–20)
CO2: 20 mmol/L — ABNORMAL LOW (ref 22–32)
Calcium: 9.6 mg/dL (ref 8.9–10.3)
Chloride: 108 mmol/L (ref 98–111)
Creatinine, Ser: <0.3 mg/dL — ABNORMAL LOW (ref 0.61–1.24)
Glucose, Bld: 103 mg/dL — ABNORMAL HIGH (ref 70–99)
Potassium: 3.6 mmol/L (ref 3.5–5.1)
Sodium: 137 mmol/L (ref 135–145)

## 2022-03-15 LAB — CBC
HCT: 27.3 % — ABNORMAL LOW (ref 39.0–52.0)
Hemoglobin: 8.8 g/dL — ABNORMAL LOW (ref 13.0–17.0)
MCH: 28.4 pg (ref 26.0–34.0)
MCHC: 32.2 g/dL (ref 30.0–36.0)
MCV: 88.1 fL (ref 80.0–100.0)
Platelets: 206 10*3/uL (ref 150–400)
RBC: 3.1 MIL/uL — ABNORMAL LOW (ref 4.22–5.81)
RDW: 15.5 % (ref 11.5–15.5)
WBC: 6.1 10*3/uL (ref 4.0–10.5)
nRBC: 0 % (ref 0.0–0.2)

## 2022-03-16 DIAGNOSIS — G909 Disorder of the autonomic nervous system, unspecified: Secondary | ICD-10-CM | POA: Diagnosis not present

## 2022-03-16 DIAGNOSIS — R6521 Severe sepsis with septic shock: Secondary | ICD-10-CM | POA: Diagnosis not present

## 2022-03-16 DIAGNOSIS — J151 Pneumonia due to Pseudomonas: Secondary | ICD-10-CM | POA: Diagnosis not present

## 2022-03-16 DIAGNOSIS — A419 Sepsis, unspecified organism: Secondary | ICD-10-CM | POA: Diagnosis not present

## 2022-03-16 DIAGNOSIS — J9621 Acute and chronic respiratory failure with hypoxia: Secondary | ICD-10-CM | POA: Diagnosis not present

## 2022-03-16 DIAGNOSIS — G1221 Amyotrophic lateral sclerosis: Secondary | ICD-10-CM | POA: Diagnosis not present

## 2022-03-16 NOTE — Progress Notes (Addendum)
Pulmonary Critical Care Medicine [place]   PULMONARY CRITICAL CARE SERVICE  PROGRESS NOTE     Jerry [name]  KWI:[id number]  DOB: 1970-10-10   DOA: 01/08/2022  Referring Physician: Satira Sark, MD  HPI: Jerry [name] is a 51 y.o. male being followed for ventilator/airway/oxygen weaning Acute on Chronic Respiratory Failure.  Patient seen lying in bed, currently remains on baseline of full support.  Medications: Reviewed on Rounds  Physical Exam:  Vitals: Temp 98.5, pulse 56, respirations 18, BP 110/68, SPO2 100%  Ventilator Settings AC VC, FiO2 28%, tidal volume 500, rate 16, PEEP 5  General: Comfortable at this time Neck: supple Cardiovascular: no malignant arrhythmias Respiratory: Bilaterally diminished Skin: no rash seen on limited exam Musculoskeletal: No gross abnormality Psychiatric:unable to assess Neurologic:no involuntary movements         Lab Data:   Basic Metabolic Panel: Recent Labs  Lab 03/11/22 0637 03/12/22 0602 03/12/22 1938 03/14/22 0312 03/15/22 0607  NA  --  138  --   --  137  K  --  3.8  --   --  3.6  CL  --  107  --   --  108  CO2  --  22  --   --  20*  GLUCOSE  --  122*  --   --  103*  BUN  --  16  --   --  22*  CREATININE  --  <0.30*  --   --  <0.30*  CALCIUM  --  9.4  --   --  9.6  MG 1.7 1.7 2.1 1.8  --   PHOS 3.2  --   --  3.4  --     ABG: No results for input(s): "PHART", "PCO2ART", "PO2ART", "HCO3", "O2SAT" in the last 168 hours.  Liver Function Tests: No results for input(s): "AST", "ALT", "ALKPHOS", "BILITOT", "PROT", "ALBUMIN" in the last 168 hours. No results for input(s): "LIPASE", "AMYLASE" in the last 168 hours. No results for input(s): "AMMONIA" in the last 168 hours.  CBC: Recent Labs  Lab 03/12/22 0602 03/15/22 0607  WBC 7.5 6.1  HGB 8.6* 8.8*  HCT 27.7* 27.3*  MCV 89.4 88.1  PLT 169 206    Cardiac Enzymes: No results for input(s): "CKTOTAL", "CKMB", "CKMBINDEX", "TROPONINI" in  the last 168 hours.  BNP (last 3 results) No results for input(s): "BNP" in the last 8760 hours.  ProBNP (last 3 results) No results for input(s): "PROBNP" in the last 8760 hours.  Radiological Exams: DG Abd Portable 1V  Result Date: 03/15/2022 CLINICAL DATA:  Ileus. EXAM: PORTABLE ABDOMEN - 1 VIEW COMPARISON:  03/12/2022 FINDINGS: Stable to minimal interval decrease in diffuse gaseous bowel distension. Gastrostomy tube overlies the medial left abdomen. IMPRESSION: Stable to minimal interval decrease in diffuse gaseous bowel distension. Electronically Signed   By: Misty Stanley M.D.   On: 03/15/2022 05:50    Assessment/Plan Active Problems:   ALS (amyotrophic lateral sclerosis) (HCC)   Septic shock (HCC)   Pseudomonas pneumonia (HCC)   Autonomic dysfunction   Acute on chronic respiratory failure with hypoxia (HCC)   Acute on chronic respiratory failure hypoxia-remains on baseline of full support.  Continue with pulmonary toilet. Status post tracheostomy-remains in place and is stable.  Continue trach care per protocol.  No plans for decannulation. ALS severe advanced disease-continue with supportive care. Pseudomonas- resolved.  Continue with supportive care. Autonomic dysfunction- at baseline right now.   I have personally seen and evaluated the patient, evaluated  laboratory and imaging results, formulated the assessment and plan and placed orders. The Patient requires high complexity decision making with multiple systems involvement.  Rounds were done with the Respiratory Therapy Director and Staff therapists and discussed with nursing staff also.  Allyne Gee, MD [facility] Pulmonary Critical Care Medicine Sleep Medicine

## 2022-03-16 NOTE — Progress Notes (Addendum)
Pulmonary Critical Care Medicine [place]   PULMONARY CRITICAL CARE SERVICE  PROGRESS NOTE     Jerry [name]  PPJ:[id number]  DOB: 1971-03-06   DOA: 01/08/2022  Referring Physician: Satira Sark, MD  HPI: Jerry [name] is a 51 y.o. male being followed for ventilator/airway/oxygen weaning Acute on Chronic Respiratory Failure.  Patient seen lying in bed, currently remains on the baseline of full support.  Continuing education ongoing with family, though according to respiratory therapy there was some discussion that the wife thinks that she may have underestimated the care that he is going to require not sure if she is going to do it on her own at home.  Medications: Reviewed on Rounds  Physical Exam:  Vitals: Temp 97.7, pulse 55, respirations 17, BP 121/69, SPO2 100%  Ventilator Settings AC VC, FiO2 28%, tidal volume 500, rate 16, PEEP 5    General: Comfortable at this time Neck: supple Cardiovascular: no malignant arrhythmias Respiratory: Bilaterally diminished Skin: no rash seen on limited exam Musculoskeletal: No gross abnormality Psychiatric:unable to assess Neurologic:no involuntary movements         Lab Data:   Basic Metabolic Panel: Recent Labs  Lab 03/11/22 0637 03/12/22 0602 03/12/22 1938 03/14/22 0312 03/15/22 0607  NA  --  138  --   --  137  K  --  3.8  --   --  3.6  CL  --  107  --   --  108  CO2  --  22  --   --  20*  GLUCOSE  --  122*  --   --  103*  BUN  --  16  --   --  22*  CREATININE  --  <0.30*  --   --  <0.30*  CALCIUM  --  9.4  --   --  9.6  MG 1.7 1.7 2.1 1.8  --   PHOS 3.2  --   --  3.4  --     ABG: No results for input(s): "PHART", "PCO2ART", "PO2ART", "HCO3", "O2SAT" in the last 168 hours.  Liver Function Tests: No results for input(s): "AST", "ALT", "ALKPHOS", "BILITOT", "PROT", "ALBUMIN" in the last 168 hours. No results for input(s): "LIPASE", "AMYLASE" in the last 168 hours. No results for input(s):  "AMMONIA" in the last 168 hours.  CBC: Recent Labs  Lab 03/12/22 0602 03/15/22 0607  WBC 7.5 6.1  HGB 8.6* 8.8*  HCT 27.7* 27.3*  MCV 89.4 88.1  PLT 169 206    Cardiac Enzymes: No results for input(s): "CKTOTAL", "CKMB", "CKMBINDEX", "TROPONINI" in the last 168 hours.  BNP (last 3 results) No results for input(s): "BNP" in the last 8760 hours.  ProBNP (last 3 results) No results for input(s): "PROBNP" in the last 8760 hours.  Radiological Exams: DG Abd Portable 1V  Result Date: 03/15/2022 CLINICAL DATA:  Ileus. EXAM: PORTABLE ABDOMEN - 1 VIEW COMPARISON:  03/12/2022 FINDINGS: Stable to minimal interval decrease in diffuse gaseous bowel distension. Gastrostomy tube overlies the medial left abdomen. IMPRESSION: Stable to minimal interval decrease in diffuse gaseous bowel distension. Electronically Signed   By: Misty Stanley M.D.   On: 03/15/2022 05:50    Assessment/Plan Active Problems:   ALS (amyotrophic lateral sclerosis) (HCC)   Septic shock (HCC)   Pseudomonas pneumonia (HCC)   Autonomic dysfunction   Acute on chronic respiratory failure with hypoxia (HCC)   Acute on chronic respiratory failure hypoxia-remains on baseline of full support.  Continue with pulmonary toilet. Status  post tracheostomy-remains in place and is stable.  Continue trach care per protocol.  No plans for decannulation. ALS severe advanced disease-continue with supportive care. Pseudomonas- resolved.  Continue with supportive care. Autonomic dysfunction- at baseline right now.   I have personally seen and evaluated the patient, evaluated laboratory and imaging results, formulated the assessment and plan and placed orders. The Patient requires high complexity decision making with multiple systems involvement.  Rounds were done with the Respiratory Therapy Director and Staff therapists and discussed with nursing staff also.  Allyne Gee, MD [facility] Pulmonary Critical Care Medicine Sleep Medicine

## 2022-03-17 ENCOUNTER — Ambulatory Visit: Payer: Self-pay

## 2022-03-17 DIAGNOSIS — J151 Pneumonia due to Pseudomonas: Secondary | ICD-10-CM | POA: Diagnosis not present

## 2022-03-17 DIAGNOSIS — J9621 Acute and chronic respiratory failure with hypoxia: Secondary | ICD-10-CM | POA: Diagnosis not present

## 2022-03-17 DIAGNOSIS — G909 Disorder of the autonomic nervous system, unspecified: Secondary | ICD-10-CM | POA: Diagnosis not present

## 2022-03-17 DIAGNOSIS — G1221 Amyotrophic lateral sclerosis: Secondary | ICD-10-CM | POA: Diagnosis not present

## 2022-03-17 DIAGNOSIS — R6521 Severe sepsis with septic shock: Secondary | ICD-10-CM | POA: Diagnosis not present

## 2022-03-17 DIAGNOSIS — A419 Sepsis, unspecified organism: Secondary | ICD-10-CM | POA: Diagnosis not present

## 2022-03-17 LAB — MAGNESIUM: Magnesium: 1.9 mg/dL (ref 1.7–2.4)

## 2022-03-17 LAB — PHOSPHORUS: Phosphorus: 3.7 mg/dL (ref 2.5–4.6)

## 2022-03-17 LAB — TRIGLYCERIDES: Triglycerides: 17 mg/dL (ref <150)

## 2022-03-17 NOTE — Progress Notes (Addendum)
Pulmonary Critical Care Medicine [place]   PULMONARY CRITICAL CARE SERVICE  PROGRESS NOTE     Nyaire Denbleyker  XIP:[id number]  DOB: 1970/08/08   DOA: 01/08/2022  Referring Physician: Satira Sark, MD  HPI: Brayam Boeke is a 51 y.o. male being followed for ventilator/airway/oxygen weaning Acute on Chronic Respiratory Failure.  Patient seen lying in bed, currently remains on baseline full support.  Respiratory continues to do education with family.  Medications: Reviewed on Rounds  Physical Exam:  Vitals: Temp 97.3, pulse 69, respirations 29, BP 116/69, SPO2 100%  Ventilator Settings AC VC, FiO2 20%, tidal volume 500, rate 16, PEEP 5  General: Comfortable at this time Neck: supple Cardiovascular: no malignant arrhythmias Respiratory: Bilaterally diminished Skin: no rash seen on limited exam Musculoskeletal: No gross abnormality Psychiatric:unable to assess Neurologic:no involuntary movements         Lab Data:   Basic Metabolic Panel: Recent Labs  Lab 03/11/22 0637 03/12/22 0602 03/12/22 1938 03/14/22 0312 03/15/22 0607 03/17/22 0526  NA  --  138  --   --  137  --   K  --  3.8  --   --  3.6  --   CL  --  107  --   --  108  --   CO2  --  22  --   --  20*  --   GLUCOSE  --  122*  --   --  103*  --   BUN  --  16  --   --  22*  --   CREATININE  --  <0.30*  --   --  <0.30*  --   CALCIUM  --  9.4  --   --  9.6  --   MG 1.7 1.7 2.1 1.8  --  1.9  PHOS 3.2  --   --  3.4  --  3.7    ABG: No results for input(s): "PHART", "PCO2ART", "PO2ART", "HCO3", "O2SAT" in the last 168 hours.  Liver Function Tests: No results for input(s): "AST", "ALT", "ALKPHOS", "BILITOT", "PROT", "ALBUMIN" in the last 168 hours. No results for input(s): "LIPASE", "AMYLASE" in the last 168 hours. No results for input(s): "AMMONIA" in the last 168 hours.  CBC: Recent Labs  Lab 03/12/22 0602 03/15/22 0607  WBC 7.5 6.1  HGB 8.6* 8.8*  HCT 27.7* 27.3*  MCV 89.4 88.1   PLT 169 206    Cardiac Enzymes: No results for input(s): "CKTOTAL", "CKMB", "CKMBINDEX", "TROPONINI" in the last 168 hours.  BNP (last 3 results) No results for input(s): "BNP" in the last 8760 hours.  ProBNP (last 3 results) No results for input(s): "PROBNP" in the last 8760 hours.  Radiological Exams: No results found.  Assessment/Plan Active Problems:   ALS (amyotrophic lateral sclerosis) (HCC)   Septic shock (HCC)   Pseudomonas pneumonia (HCC)   Autonomic dysfunction   Acute on chronic respiratory failure with hypoxia (HCC)   Acute on chronic respiratory failure hypoxia-remains on baseline of full support.  Continue with pulmonary toilet. Status post tracheostomy-remains in place and is stable.  Continue trach care per protocol.  No plans for decannulation. ALS severe advanced disease-continue with supportive care. Pseudomonas- resolved.  Continue with supportive care. Autonomic dysfunction- at baseline right now.   I have personally seen and evaluated the patient, evaluated laboratory and imaging results, formulated the assessment and plan and placed orders. The Patient requires high complexity decision making with multiple systems involvement.  Rounds were done with  the Respiratory Therapy Director and Staff therapists and discussed with nursing staff also.  Allyne Gee, MD [facility] Pulmonary Critical Care Medicine Sleep Medicine

## 2022-03-18 ENCOUNTER — Other Ambulatory Visit (HOSPITAL_COMMUNITY): Payer: Self-pay

## 2022-03-18 DIAGNOSIS — K6389 Other specified diseases of intestine: Secondary | ICD-10-CM | POA: Diagnosis not present

## 2022-03-18 DIAGNOSIS — G909 Disorder of the autonomic nervous system, unspecified: Secondary | ICD-10-CM | POA: Diagnosis not present

## 2022-03-18 DIAGNOSIS — G1221 Amyotrophic lateral sclerosis: Secondary | ICD-10-CM | POA: Diagnosis not present

## 2022-03-18 DIAGNOSIS — Z931 Gastrostomy status: Secondary | ICD-10-CM | POA: Diagnosis not present

## 2022-03-18 DIAGNOSIS — A419 Sepsis, unspecified organism: Secondary | ICD-10-CM | POA: Diagnosis not present

## 2022-03-18 DIAGNOSIS — J151 Pneumonia due to Pseudomonas: Secondary | ICD-10-CM | POA: Diagnosis not present

## 2022-03-18 DIAGNOSIS — J9621 Acute and chronic respiratory failure with hypoxia: Secondary | ICD-10-CM | POA: Diagnosis not present

## 2022-03-18 DIAGNOSIS — K567 Ileus, unspecified: Secondary | ICD-10-CM | POA: Diagnosis not present

## 2022-03-18 DIAGNOSIS — R6521 Severe sepsis with septic shock: Secondary | ICD-10-CM | POA: Diagnosis not present

## 2022-03-18 LAB — CBC
HCT: 31.9 % — ABNORMAL LOW (ref 39.0–52.0)
Hemoglobin: 10 g/dL — ABNORMAL LOW (ref 13.0–17.0)
MCH: 28 pg (ref 26.0–34.0)
MCHC: 31.3 g/dL (ref 30.0–36.0)
MCV: 89.4 fL (ref 80.0–100.0)
Platelets: 188 10*3/uL (ref 150–400)
RBC: 3.57 MIL/uL — ABNORMAL LOW (ref 4.22–5.81)
RDW: 15.9 % — ABNORMAL HIGH (ref 11.5–15.5)
WBC: 7.6 10*3/uL (ref 4.0–10.5)
nRBC: 0 % (ref 0.0–0.2)

## 2022-03-18 LAB — BASIC METABOLIC PANEL
Anion gap: 7 (ref 5–15)
BUN: 21 mg/dL — ABNORMAL HIGH (ref 6–20)
CO2: 21 mmol/L — ABNORMAL LOW (ref 22–32)
Calcium: 9.4 mg/dL (ref 8.9–10.3)
Chloride: 113 mmol/L — ABNORMAL HIGH (ref 98–111)
Creatinine, Ser: <0.3 mg/dL — ABNORMAL LOW (ref 0.61–1.24)
Glucose, Bld: 114 mg/dL — ABNORMAL HIGH (ref 70–99)
Potassium: 3.8 mmol/L (ref 3.5–5.1)
Sodium: 141 mmol/L (ref 135–145)

## 2022-03-18 LAB — MAGNESIUM: Magnesium: 2 mg/dL (ref 1.7–2.4)

## 2022-03-18 NOTE — Progress Notes (Signed)
Pulmonary Critical Care Medicine [place]   PULMONARY CRITICAL CARE SERVICE  PROGRESS NOTE     Jerry [name]  QIO:[id number]  DOB: 28-Nov-1970   DOA: 01/08/2022  Referring Physician: Satira Sark, MD  HPI: Jerry [name] is a 51 y.o. male being followed for ventilator/airway/oxygen weaning Acute on Chronic Respiratory Failure.  Patient currently is on assist control on 28% FiO2  Medications: Reviewed on Rounds  Physical Exam:  Vitals: Temperature is 97.1 pulse 58 respiratory is 20 blood pressure is 126/68 saturations 100%  Ventilator Settings assist-control FiO2 28% tidal volume 500 PEEP 5  General: Comfortable at this time Neck: supple Cardiovascular: no malignant arrhythmias Respiratory: Patient currently is without rhonchi or rales Skin: no rash seen on limited exam Musculoskeletal: No gross abnormality Psychiatric:unable to assess Neurologic:no involuntary movements         Lab Data:   Basic Metabolic Panel: Recent Labs  Lab 03/12/22 0602 03/12/22 1938 03/14/22 0312 03/15/22 0607 03/17/22 0526 03/18/22 0404  NA 138  --   --  137  --  141  K 3.8  --   --  3.6  --  3.8  CL 107  --   --  108  --  113*  CO2 22  --   --  20*  --  21*  GLUCOSE 122*  --   --  103*  --  114*  BUN 16  --   --  22*  --  21*  CREATININE <0.30*  --   --  <0.30*  --  <0.30*  CALCIUM 9.4  --   --  9.6  --  9.4  MG 1.7 2.1 1.8  --  1.9 2.0  PHOS  --   --  3.4  --  3.7  --     ABG: No results for input(s): "PHART", "PCO2ART", "PO2ART", "HCO3", "O2SAT" in the last 168 hours.  Liver Function Tests: No results for input(s): "AST", "ALT", "ALKPHOS", "BILITOT", "PROT", "ALBUMIN" in the last 168 hours. No results for input(s): "LIPASE", "AMYLASE" in the last 168 hours. No results for input(s): "AMMONIA" in the last 168 hours.  CBC: Recent Labs  Lab 03/12/22 0602 03/15/22 0607 03/18/22 0404  WBC 7.5 6.1 7.6  HGB 8.6* 8.8* 10.0*  HCT 27.7* 27.3* 31.9*  MCV  89.4 88.1 89.4  PLT 169 206 188    Cardiac Enzymes: No results for input(s): "CKTOTAL", "CKMB", "CKMBINDEX", "TROPONINI" in the last 168 hours.  BNP (last 3 results) No results for input(s): "BNP" in the last 8760 hours.  ProBNP (last 3 results) No results for input(s): "PROBNP" in the last 8760 hours.  Radiological Exams: No results found.  Assessment/Plan Active Problems:   ALS (amyotrophic lateral sclerosis) (HCC)   Septic shock (HCC)   Pseudomonas pneumonia (HCC)   Autonomic dysfunction   Acute on chronic respiratory failure with hypoxia (HCC)   Acute on chronic respiratory failure with hypoxia we will continue with full vent support ongoing home training ALS no change we will continue to follow along Pseudomonas pneumonia treated Autonomic dysfunction no change Sepsis resolved   I have personally seen and evaluated the patient, evaluated laboratory and imaging results, formulated the assessment and plan and placed orders. The Patient requires high complexity decision making with multiple systems involvement.  Rounds were done with the Respiratory Therapy Director and Staff therapists and discussed with nursing staff also.  Allyne Gee, MD [facility] Pulmonary Critical Care Medicine Sleep Medicine

## 2022-03-19 ENCOUNTER — Ambulatory Visit: Payer: Self-pay

## 2022-03-19 DIAGNOSIS — R6521 Severe sepsis with septic shock: Secondary | ICD-10-CM | POA: Diagnosis not present

## 2022-03-19 DIAGNOSIS — J9621 Acute and chronic respiratory failure with hypoxia: Secondary | ICD-10-CM | POA: Diagnosis not present

## 2022-03-19 DIAGNOSIS — G909 Disorder of the autonomic nervous system, unspecified: Secondary | ICD-10-CM | POA: Diagnosis not present

## 2022-03-19 DIAGNOSIS — A419 Sepsis, unspecified organism: Secondary | ICD-10-CM | POA: Diagnosis not present

## 2022-03-19 DIAGNOSIS — G1221 Amyotrophic lateral sclerosis: Secondary | ICD-10-CM | POA: Diagnosis not present

## 2022-03-19 DIAGNOSIS — J151 Pneumonia due to Pseudomonas: Secondary | ICD-10-CM | POA: Diagnosis not present

## 2022-03-19 NOTE — Progress Notes (Signed)
Pulmonary Critical Care Medicine [place]   PULMONARY CRITICAL CARE SERVICE  PROGRESS NOTE     Whalen Trompeter  XAJ:[id number]  DOB: Mar 20, 1971   DOA: 01/08/2022  Referring Physician: Satira Sark, MD  HPI: Jerry [name] is a 51 y.o. male being followed for ventilator/airway/oxygen weaning Acute on Chronic Respiratory Failure.  Patient currently is on assist control mode has been on 28% FiO2 good saturations are noted  Medications: Reviewed on Rounds  Physical Exam:  Vitals: Temperature is 98.0 pulse of 66 respiratory rate is 18 blood pressure 91/50 saturations 98%  Ventilator Settings assist-control FiO2 28% tidal volume 500 PEEP 5  General: Comfortable at this time Neck: supple Cardiovascular: no malignant arrhythmias Respiratory: No rhonchi very coarse breath sounds Skin: no rash seen on limited exam Musculoskeletal: No gross abnormality Psychiatric:unable to assess Neurologic:no involuntary movements         Lab Data:   Basic Metabolic Panel: Recent Labs  Lab 03/12/22 1938 03/14/22 0312 03/15/22 0607 03/17/22 0526 03/18/22 0404  NA  --   --  137  --  141  K  --   --  3.6  --  3.8  CL  --   --  108  --  113*  CO2  --   --  20*  --  21*  GLUCOSE  --   --  103*  --  114*  BUN  --   --  22*  --  21*  CREATININE  --   --  <0.30*  --  <0.30*  CALCIUM  --   --  9.6  --  9.4  MG 2.1 1.8  --  1.9 2.0  PHOS  --  3.4  --  3.7  --     ABG: No results for input(s): "PHART", "PCO2ART", "PO2ART", "HCO3", "O2SAT" in the last 168 hours.  Liver Function Tests: No results for input(s): "AST", "ALT", "ALKPHOS", "BILITOT", "PROT", "ALBUMIN" in the last 168 hours. No results for input(s): "LIPASE", "AMYLASE" in the last 168 hours. No results for input(s): "AMMONIA" in the last 168 hours.  CBC: Recent Labs  Lab 03/15/22 0607 03/18/22 0404  WBC 6.1 7.6  HGB 8.8* 10.0*  HCT 27.3* 31.9*  MCV 88.1 89.4  PLT 206 188    Cardiac Enzymes: No  results for input(s): "CKTOTAL", "CKMB", "CKMBINDEX", "TROPONINI" in the last 168 hours.  BNP (last 3 results) No results for input(s): "BNP" in the last 8760 hours.  ProBNP (last 3 results) No results for input(s): "PROBNP" in the last 8760 hours.  Radiological Exams: DG Abd Portable 1V  Result Date: 03/18/2022 CLINICAL DATA:  Ileus. EXAM: PORTABLE ABDOMEN - 1 VIEW COMPARISON:  Abdominal radiograph 03/15/2022 FINDINGS: A gastrostomy tube remains in place, projecting over the left upper abdomen. Diffuse gaseous bowel distension has not significantly changed. No acute osseous abnormality is seen. IMPRESSION: Unchanged diffuse gaseous bowel distension compatible with ileus. Electronically Signed   By: Logan Bores M.D.   On: 03/18/2022 15:52    Assessment/Plan Active Problems:   ALS (amyotrophic lateral sclerosis) (HCC)   Septic shock (HCC)   Pseudomonas pneumonia (HCC)   Autonomic dysfunction   Acute on chronic respiratory failure with hypoxia (HCC)   Acute on chronic respiratory failure hypoxia plan is going to be to continue with full support baseline on the ventilator.  Patient is being trained for home ventilation ALS severe advanced terminal disease Sepsis with shock prognosis guarded clinically is improved Pseudomonas remains at risk for further  infections Autonomic dysfunction supportive care   I have personally seen and evaluated the patient, evaluated laboratory and imaging results, formulated the assessment and plan and placed orders. The Patient requires high complexity decision making with multiple systems involvement.  Rounds were done with the Respiratory Therapy Director and Staff therapists and discussed with nursing staff also.  Allyne Gee, MD [facility] Pulmonary Critical Care Medicine Sleep Medicine

## 2022-03-20 DIAGNOSIS — R6521 Severe sepsis with septic shock: Secondary | ICD-10-CM | POA: Diagnosis not present

## 2022-03-20 DIAGNOSIS — A419 Sepsis, unspecified organism: Secondary | ICD-10-CM | POA: Diagnosis not present

## 2022-03-20 DIAGNOSIS — J151 Pneumonia due to Pseudomonas: Secondary | ICD-10-CM | POA: Diagnosis not present

## 2022-03-20 DIAGNOSIS — G1221 Amyotrophic lateral sclerosis: Secondary | ICD-10-CM | POA: Diagnosis not present

## 2022-03-20 DIAGNOSIS — G909 Disorder of the autonomic nervous system, unspecified: Secondary | ICD-10-CM | POA: Diagnosis not present

## 2022-03-20 DIAGNOSIS — J9621 Acute and chronic respiratory failure with hypoxia: Secondary | ICD-10-CM | POA: Diagnosis not present

## 2022-03-20 LAB — MAGNESIUM: Magnesium: 1.8 mg/dL (ref 1.7–2.4)

## 2022-03-20 LAB — PHOSPHORUS: Phosphorus: 3.9 mg/dL (ref 2.5–4.6)

## 2022-03-20 NOTE — Progress Notes (Signed)
Pulmonary Critical Care Medicine [place]   PULMONARY CRITICAL CARE SERVICE  PROGRESS NOTE     Avi Kerschner  AVW:[id number]  DOB: 07-16-1970   DOA: 01/08/2022  Referring Physician: Satira Sark, MD  HPI: Neythan Kozlov is a 51 y.o. male being followed for ventilator/airway/oxygen weaning Acute on Chronic Respiratory Failure.  Patient is on full support on the ventilator remains at baseline assist-control  Medications: Reviewed on Rounds  Physical Exam:  Vitals: Temperature is 98.0 pulse 66 respiratory rate is 16 blood pressure 95/58 saturations 99%  Ventilator Settings on assist control FiO2 is 28% tidal volume 500 PEEP 5  General: Comfortable at this time Neck: supple Cardiovascular: no malignant arrhythmias Respiratory: No rhonchi no rales are noted Skin: no rash seen on limited exam Musculoskeletal: No gross abnormality Psychiatric:unable to assess Neurologic:no involuntary movements         Lab Data:   Basic Metabolic Panel: Recent Labs  Lab 03/14/22 0312 03/15/22 0607 03/17/22 0526 03/18/22 0404 03/20/22 0404  NA  --  137  --  141  --   K  --  3.6  --  3.8  --   CL  --  108  --  113*  --   CO2  --  20*  --  21*  --   GLUCOSE  --  103*  --  114*  --   BUN  --  22*  --  21*  --   CREATININE  --  <0.30*  --  <0.30*  --   CALCIUM  --  9.6  --  9.4  --   MG 1.8  --  1.9 2.0 1.8  PHOS 3.4  --  3.7  --  3.9    ABG: No results for input(s): "PHART", "PCO2ART", "PO2ART", "HCO3", "O2SAT" in the last 168 hours.  Liver Function Tests: No results for input(s): "AST", "ALT", "ALKPHOS", "BILITOT", "PROT", "ALBUMIN" in the last 168 hours. No results for input(s): "LIPASE", "AMYLASE" in the last 168 hours. No results for input(s): "AMMONIA" in the last 168 hours.  CBC: Recent Labs  Lab 03/15/22 0607 03/18/22 0404  WBC 6.1 7.6  HGB 8.8* 10.0*  HCT 27.3* 31.9*  MCV 88.1 89.4  PLT 206 188    Cardiac Enzymes: No results for input(s):  "CKTOTAL", "CKMB", "CKMBINDEX", "TROPONINI" in the last 168 hours.  BNP (last 3 results) No results for input(s): "BNP" in the last 8760 hours.  ProBNP (last 3 results) No results for input(s): "PROBNP" in the last 8760 hours.  Radiological Exams: DG Abd Portable 1V  Result Date: 03/18/2022 CLINICAL DATA:  Ileus. EXAM: PORTABLE ABDOMEN - 1 VIEW COMPARISON:  Abdominal radiograph 03/15/2022 FINDINGS: A gastrostomy tube remains in place, projecting over the left upper abdomen. Diffuse gaseous bowel distension has not significantly changed. No acute osseous abnormality is seen. IMPRESSION: Unchanged diffuse gaseous bowel distension compatible with ileus. Electronically Signed   By: Logan Bores M.D.   On: 03/18/2022 15:52    Assessment/Plan Active Problems:   ALS (amyotrophic lateral sclerosis) (HCC)   Septic shock (HCC)   Pseudomonas pneumonia (HCC)   Autonomic dysfunction   Acute on chronic respiratory failure with hypoxia (HCC)   Acute on chronic respiratory failure hypoxia on assist control mode patient is on 28% FiO2 good saturations are noted Sepsis and shock with supportive care hemodynamics are stable Pseudomonas pneumonia treated we will continue to follow along Autonomic dysfunction no change ALS advanced severe disease   I have personally seen  and evaluated the patient, evaluated laboratory and imaging results, formulated the assessment and plan and placed orders. The Patient requires high complexity decision making with multiple systems involvement.  Rounds were done with the Respiratory Therapy Director and Staff therapists and discussed with nursing staff also.  Allyne Gee, MD [facility] Pulmonary Critical Care Medicine Sleep Medicine

## 2022-03-21 ENCOUNTER — Other Ambulatory Visit (HOSPITAL_COMMUNITY): Payer: Self-pay

## 2022-03-21 DIAGNOSIS — K567 Ileus, unspecified: Secondary | ICD-10-CM | POA: Diagnosis not present

## 2022-03-21 DIAGNOSIS — R6521 Severe sepsis with septic shock: Secondary | ICD-10-CM | POA: Diagnosis not present

## 2022-03-21 DIAGNOSIS — J151 Pneumonia due to Pseudomonas: Secondary | ICD-10-CM | POA: Diagnosis not present

## 2022-03-21 DIAGNOSIS — A419 Sepsis, unspecified organism: Secondary | ICD-10-CM | POA: Diagnosis not present

## 2022-03-21 DIAGNOSIS — J9621 Acute and chronic respiratory failure with hypoxia: Secondary | ICD-10-CM | POA: Diagnosis not present

## 2022-03-21 DIAGNOSIS — G1221 Amyotrophic lateral sclerosis: Secondary | ICD-10-CM | POA: Diagnosis not present

## 2022-03-21 DIAGNOSIS — G909 Disorder of the autonomic nervous system, unspecified: Secondary | ICD-10-CM | POA: Diagnosis not present

## 2022-03-21 DIAGNOSIS — I878 Other specified disorders of veins: Secondary | ICD-10-CM | POA: Diagnosis not present

## 2022-03-21 NOTE — Progress Notes (Signed)
Pulmonary Critical Care Medicine [place]   PULMONARY CRITICAL CARE SERVICE  PROGRESS NOTE     Yvonne Petite  JSR:[id number]  DOB: 11-07-1970   DOA: 01/08/2022  Referring Physician: Satira Sark, MD  HPI: Brain Honeycutt is a 51 y.o. male being followed for ventilator/airway/oxygen weaning Acute on Chronic Respiratory Failure.  Patient is currently on assist control has been on 28% FiO2 with good volumes  Medications: Reviewed on Rounds  Physical Exam:  Vitals: Temperature 97.5 pulse 59 respiratory rate is 28 blood pressure 115/65 saturations 100%  Ventilator Settings on assist control FiO2 is 28% tidal volume 500 PEEP 5  General: Comfortable at this time Neck: supple Cardiovascular: no malignant arrhythmias Respiratory: No rhonchi no rales are noted at this time Skin: no rash seen on limited exam Musculoskeletal: No gross abnormality Psychiatric:unable to assess Neurologic:no involuntary movements         Lab Data:   Basic Metabolic Panel: Recent Labs  Lab 03/15/22 0607 03/17/22 0526 03/18/22 0404 03/20/22 0404  NA 137  --  141  --   K 3.6  --  3.8  --   CL 108  --  113*  --   CO2 20*  --  21*  --   GLUCOSE 103*  --  114*  --   BUN 22*  --  21*  --   CREATININE <0.30*  --  <0.30*  --   CALCIUM 9.6  --  9.4  --   MG  --  1.9 2.0 1.8  PHOS  --  3.7  --  3.9    ABG: No results for input(s): "PHART", "PCO2ART", "PO2ART", "HCO3", "O2SAT" in the last 168 hours.  Liver Function Tests: No results for input(s): "AST", "ALT", "ALKPHOS", "BILITOT", "PROT", "ALBUMIN" in the last 168 hours. No results for input(s): "LIPASE", "AMYLASE" in the last 168 hours. No results for input(s): "AMMONIA" in the last 168 hours.  CBC: Recent Labs  Lab 03/15/22 0607 03/18/22 0404  WBC 6.1 7.6  HGB 8.8* 10.0*  HCT 27.3* 31.9*  MCV 88.1 89.4  PLT 206 188    Cardiac Enzymes: No results for input(s): "CKTOTAL", "CKMB", "CKMBINDEX", "TROPONINI" in the  last 168 hours.  BNP (last 3 results) No results for input(s): "BNP" in the last 8760 hours.  ProBNP (last 3 results) No results for input(s): "PROBNP" in the last 8760 hours.  Radiological Exams: DG Abd 1 View  Result Date: 03/21/2022 CLINICAL DATA:  51 year old male with abnormal bowel-gas pattern compatible with ileus. EXAM: ABDOMEN - 1 VIEW COMPARISON:  03/18/2022 abdominal radiographs and earlier. Portable chest 03/04/2022. FINDINGS: Supine views at 0715 hours. Percutaneous gastrostomy tube and Foley or rectal tube appears stable. Abundant gas containing bowel loops in the abdomen and pelvis, although mildly improved since 03/15/2022. Paucity of bowel gas in the right abdomen. But no liver enlargement or right abdominal mass on CT Abdomen and Pelvis in June. Partially visible mediastinal vascular catheter. Stable visualized osseous structures. Pelvic phleboliths. IMPRESSION: 1. Mildly improved ileus pattern bowel-gas appearance since 03/15/2022. 2. Stable visible lines and tubes. Electronically Signed   By: Genevie Ann M.D.   On: 03/21/2022 07:44    Assessment/Plan Active Problems:   ALS (amyotrophic lateral sclerosis) (HCC)   Septic shock (HCC)   Pseudomonas pneumonia (HCC)   Autonomic dysfunction   Acute on chronic respiratory failure with hypoxia (HCC)   Acute on chronic respiratory failure with hypoxia we will continue with assist control mode on 28% FiO2 with  tidal volume of 500 PEEP of 5 Sepsis with shock treated resolved hemodynamics are stable Pseudomonas pneumonia has been treated we will continue to follow along closely Autonomic dysfunction no change ALS supportive care   I have personally seen and evaluated the patient, evaluated laboratory and imaging results, formulated the assessment and plan and placed orders. The Patient requires high complexity decision making with multiple systems involvement.  Rounds were done with the Respiratory Therapy Director and Staff  therapists and discussed with nursing staff also.  Allyne Gee, MD [facility] Pulmonary Critical Care Medicine Sleep Medicine

## 2022-03-22 DIAGNOSIS — R6521 Severe sepsis with septic shock: Secondary | ICD-10-CM | POA: Diagnosis not present

## 2022-03-22 DIAGNOSIS — J9621 Acute and chronic respiratory failure with hypoxia: Secondary | ICD-10-CM | POA: Diagnosis not present

## 2022-03-22 DIAGNOSIS — G909 Disorder of the autonomic nervous system, unspecified: Secondary | ICD-10-CM | POA: Diagnosis not present

## 2022-03-22 DIAGNOSIS — A419 Sepsis, unspecified organism: Secondary | ICD-10-CM | POA: Diagnosis not present

## 2022-03-22 DIAGNOSIS — G1221 Amyotrophic lateral sclerosis: Secondary | ICD-10-CM | POA: Diagnosis not present

## 2022-03-22 DIAGNOSIS — J151 Pneumonia due to Pseudomonas: Secondary | ICD-10-CM | POA: Diagnosis not present

## 2022-03-22 LAB — BASIC METABOLIC PANEL
Anion gap: 6 (ref 5–15)
BUN: 22 mg/dL — ABNORMAL HIGH (ref 6–20)
CO2: 22 mmol/L (ref 22–32)
Calcium: 9.2 mg/dL (ref 8.9–10.3)
Chloride: 111 mmol/L (ref 98–111)
Creatinine, Ser: <0.3 mg/dL — ABNORMAL LOW (ref 0.61–1.24)
Glucose, Bld: 111 mg/dL — ABNORMAL HIGH (ref 70–99)
Potassium: 3.8 mmol/L (ref 3.5–5.1)
Sodium: 139 mmol/L (ref 135–145)

## 2022-03-22 LAB — CBC
HCT: 26.1 % — ABNORMAL LOW (ref 39.0–52.0)
Hemoglobin: 8 g/dL — ABNORMAL LOW (ref 13.0–17.0)
MCH: 27.3 pg (ref 26.0–34.0)
MCHC: 30.7 g/dL (ref 30.0–36.0)
MCV: 89.1 fL (ref 80.0–100.0)
Platelets: 124 10*3/uL — ABNORMAL LOW (ref 150–400)
RBC: 2.93 MIL/uL — ABNORMAL LOW (ref 4.22–5.81)
RDW: 15.7 % — ABNORMAL HIGH (ref 11.5–15.5)
WBC: 4.1 10*3/uL (ref 4.0–10.5)
nRBC: 0 % (ref 0.0–0.2)

## 2022-03-22 NOTE — Progress Notes (Signed)
Pulmonary Critical Care Medicine [place]   PULMONARY CRITICAL CARE SERVICE  PROGRESS NOTE     Jerry [name]  LPF:[id number]  DOB: 12-24-1970   DOA: 01/08/2022  Referring Physician: Satira Sark, MD  HPI: Jerry [name] is a 51 y.o. male being followed for ventilator/airway/oxygen weaning Acute on Chronic Respiratory Failure.  Patient currently is on assist control mode has been on 28% FiO2 at baseline  Medications: Reviewed on Rounds  Physical Exam:  Vitals: Temperature 97.2 pulse 51 respiratory 20 blood pressure is 93/54 saturations 100%  Ventilator Settings assist-control FiO2 28% tidal volume 500 PEEP 5  General: Comfortable at this time Neck: supple Cardiovascular: no malignant arrhythmias Respiratory: Scattered rhonchi expansion equal Skin: no rash seen on limited exam Musculoskeletal: No gross abnormality Psychiatric:unable to assess Neurologic:no involuntary movements         Lab Data:   Basic Metabolic Panel: Recent Labs  Lab 03/17/22 0526 03/18/22 0404 03/20/22 0404 03/22/22 0641  NA  --  141  --  139  K  --  3.8  --  3.8  CL  --  113*  --  111  CO2  --  21*  --  22  GLUCOSE  --  114*  --  111*  BUN  --  21*  --  22*  CREATININE  --  <0.30*  --  <0.30*  CALCIUM  --  9.4  --  9.2  MG 1.9 2.0 1.8  --   PHOS 3.7  --  3.9  --     ABG: No results for input(s): "PHART", "PCO2ART", "PO2ART", "HCO3", "O2SAT" in the last 168 hours.  Liver Function Tests: No results for input(s): "AST", "ALT", "ALKPHOS", "BILITOT", "PROT", "ALBUMIN" in the last 168 hours. No results for input(s): "LIPASE", "AMYLASE" in the last 168 hours. No results for input(s): "AMMONIA" in the last 168 hours.  CBC: Recent Labs  Lab 03/18/22 0404 03/22/22 0641  WBC 7.6 4.1  HGB 10.0* 8.0*  HCT 31.9* 26.1*  MCV 89.4 89.1  PLT 188 124*    Cardiac Enzymes: No results for input(s): "CKTOTAL", "CKMB", "CKMBINDEX", "TROPONINI" in the last 168 hours.  BNP  (last 3 results) No results for input(s): "BNP" in the last 8760 hours.  ProBNP (last 3 results) No results for input(s): "PROBNP" in the last 8760 hours.  Radiological Exams: DG Abd 1 View  Result Date: 03/21/2022 CLINICAL DATA:  51 year old male with abnormal bowel-gas pattern compatible with ileus. EXAM: ABDOMEN - 1 VIEW COMPARISON:  03/18/2022 abdominal radiographs and earlier. Portable chest 03/04/2022. FINDINGS: Supine views at 0715 hours. Percutaneous gastrostomy tube and Foley or rectal tube appears stable. Abundant gas containing bowel loops in the abdomen and pelvis, although mildly improved since 03/15/2022. Paucity of bowel gas in the right abdomen. But no liver enlargement or right abdominal mass on CT Abdomen and Pelvis in June. Partially visible mediastinal vascular catheter. Stable visualized osseous structures. Pelvic phleboliths. IMPRESSION: 1. Mildly improved ileus pattern bowel-gas appearance since 03/15/2022. 2. Stable visible lines and tubes. Electronically Signed   By: Genevie Ann M.D.   On: 03/21/2022 07:44    Assessment/Plan Active Problems:   ALS (amyotrophic lateral sclerosis) (HCC)   Septic shock (HCC)   Pseudomonas pneumonia (HCC)   Autonomic dysfunction   Acute on chronic respiratory failure with hypoxia (HCC)   Acute on chronic respiratory failure with hypoxia patient is doing fine with baseline settings assist control. ALS advanced severe disease Sepsis shock resolved Pseudomonas pneumonia has been  treated slow improvement Autonomic dysfunction appears to be at baseline   I have personally seen and evaluated the patient, evaluated laboratory and imaging results, formulated the assessment and plan and placed orders. The Patient requires high complexity decision making with multiple systems involvement.  Rounds were done with the Respiratory Therapy Director and Staff therapists and discussed with nursing staff also.  Allyne Gee, MD [facility] Pulmonary Critical  Care Medicine Sleep Medicine

## 2022-03-23 ENCOUNTER — Other Ambulatory Visit (HOSPITAL_COMMUNITY): Payer: Self-pay

## 2022-03-23 DIAGNOSIS — G909 Disorder of the autonomic nervous system, unspecified: Secondary | ICD-10-CM | POA: Diagnosis not present

## 2022-03-23 DIAGNOSIS — K567 Ileus, unspecified: Secondary | ICD-10-CM | POA: Diagnosis not present

## 2022-03-23 DIAGNOSIS — R6521 Severe sepsis with septic shock: Secondary | ICD-10-CM | POA: Diagnosis not present

## 2022-03-23 DIAGNOSIS — J9621 Acute and chronic respiratory failure with hypoxia: Secondary | ICD-10-CM | POA: Diagnosis not present

## 2022-03-23 DIAGNOSIS — J151 Pneumonia due to Pseudomonas: Secondary | ICD-10-CM | POA: Diagnosis not present

## 2022-03-23 DIAGNOSIS — A419 Sepsis, unspecified organism: Secondary | ICD-10-CM | POA: Diagnosis not present

## 2022-03-23 DIAGNOSIS — G1221 Amyotrophic lateral sclerosis: Secondary | ICD-10-CM | POA: Diagnosis not present

## 2022-03-23 LAB — CBC
HCT: 30.3 % — ABNORMAL LOW (ref 39.0–52.0)
Hemoglobin: 9.5 g/dL — ABNORMAL LOW (ref 13.0–17.0)
MCH: 27.9 pg (ref 26.0–34.0)
MCHC: 31.4 g/dL (ref 30.0–36.0)
MCV: 88.9 fL (ref 80.0–100.0)
Platelets: ADEQUATE 10*3/uL (ref 150–400)
RBC: 3.41 MIL/uL — ABNORMAL LOW (ref 4.22–5.81)
RDW: 15.9 % — ABNORMAL HIGH (ref 11.5–15.5)
WBC: 5.8 10*3/uL (ref 4.0–10.5)
nRBC: 0 % (ref 0.0–0.2)

## 2022-03-23 LAB — BASIC METABOLIC PANEL
Anion gap: 7 (ref 5–15)
BUN: 20 mg/dL (ref 6–20)
CO2: 22 mmol/L (ref 22–32)
Calcium: 9.3 mg/dL (ref 8.9–10.3)
Chloride: 110 mmol/L (ref 98–111)
Creatinine, Ser: <0.3 mg/dL — ABNORMAL LOW (ref 0.61–1.24)
Glucose, Bld: 107 mg/dL — ABNORMAL HIGH (ref 70–99)
Potassium: 3.6 mmol/L (ref 3.5–5.1)
Sodium: 139 mmol/L (ref 135–145)

## 2022-03-23 LAB — MAGNESIUM: Magnesium: 1.9 mg/dL (ref 1.7–2.4)

## 2022-03-23 LAB — PHOSPHORUS: Phosphorus: 3.8 mg/dL (ref 2.5–4.6)

## 2022-03-24 DIAGNOSIS — A419 Sepsis, unspecified organism: Secondary | ICD-10-CM | POA: Diagnosis not present

## 2022-03-24 DIAGNOSIS — J9621 Acute and chronic respiratory failure with hypoxia: Secondary | ICD-10-CM | POA: Diagnosis not present

## 2022-03-24 DIAGNOSIS — R6521 Severe sepsis with septic shock: Secondary | ICD-10-CM | POA: Diagnosis not present

## 2022-03-24 DIAGNOSIS — G1221 Amyotrophic lateral sclerosis: Secondary | ICD-10-CM | POA: Diagnosis not present

## 2022-03-24 DIAGNOSIS — J151 Pneumonia due to Pseudomonas: Secondary | ICD-10-CM | POA: Diagnosis not present

## 2022-03-24 DIAGNOSIS — G909 Disorder of the autonomic nervous system, unspecified: Secondary | ICD-10-CM | POA: Diagnosis not present

## 2022-03-24 LAB — TRIGLYCERIDES: Triglycerides: 11 mg/dL (ref <150)

## 2022-03-24 NOTE — Progress Notes (Signed)
Pulmonary Critical Care Medicine [place]   PULMONARY CRITICAL CARE SERVICE  PROGRESS NOTE     Corvin Sorbo  ZOX:[id number]  DOB: Jul 31, 1970   DOA: 01/08/2022  Referring Physician: Satira Sark, MD  HPI: Abanoub Hanken is a 51 y.o. male being followed for ventilator/airway/oxygen weaning Acute on Chronic Respiratory Failure.  Patient remains on the ventilator home training underway remains on assist control  Medications: Reviewed on Rounds  Physical Exam:  Vitals: Temperature is 97.1 pulse 58 respiratory rate 18 blood pressure 101/59 saturations 100%  Ventilator Settings assist-control FiO2 28% tidal volume 500 PEEP of 5  General: Comfortable at this time Neck: supple Cardiovascular: no malignant arrhythmias Respiratory: No rhonchi no rales are noted Skin: no rash seen on limited exam Musculoskeletal: No gross abnormality Psychiatric:unable to assess Neurologic:no involuntary movements         Lab Data:   Basic Metabolic Panel: Recent Labs  Lab 03/18/22 0404 03/20/22 0404 03/22/22 0641 03/23/22 0449  NA 141  --  139 139  K 3.8  --  3.8 3.6  CL 113*  --  111 110  CO2 21*  --  22 22  GLUCOSE 114*  --  111* 107*  BUN 21*  --  22* 20  CREATININE <0.30*  --  <0.30* <0.30*  CALCIUM 9.4  --  9.2 9.3  MG 2.0 1.8  --  1.9  PHOS  --  3.9  --  3.8    ABG: No results for input(s): "PHART", "PCO2ART", "PO2ART", "HCO3", "O2SAT" in the last 168 hours.  Liver Function Tests: No results for input(s): "AST", "ALT", "ALKPHOS", "BILITOT", "PROT", "ALBUMIN" in the last 168 hours. No results for input(s): "LIPASE", "AMYLASE" in the last 168 hours. No results for input(s): "AMMONIA" in the last 168 hours.  CBC: Recent Labs  Lab 03/18/22 0404 03/22/22 0641 03/23/22 0449  WBC 7.6 4.1 5.8  HGB 10.0* 8.0* 9.5*  HCT 31.9* 26.1* 30.3*  MCV 89.4 89.1 88.9  PLT 188 124* PLATELET CLUMPS NOTED ON SMEAR, COUNT APPEARS ADEQUATE    Cardiac Enzymes: No  results for input(s): "CKTOTAL", "CKMB", "CKMBINDEX", "TROPONINI" in the last 168 hours.  BNP (last 3 results) No results for input(s): "BNP" in the last 8760 hours.  ProBNP (last 3 results) No results for input(s): "PROBNP" in the last 8760 hours.  Radiological Exams: DG Abd Portable 1V  Result Date: 03/23/2022 CLINICAL DATA:  81191.  Follow-up of ileus. EXAM: PORTABLE ABDOMEN - 1 VIEW COMPARISON:  Study of 03/21/2022. FINDINGS: Single AP exam at 5:59 a.m. PEG tube and bladder catheter versus rectal tube are again shown. There is aeration of the intestinal tract without pathologic dilatation, with bowel gas seen at least through to the distal descending colon. Aeration is similar to the last study with no interval worsening. There is no supine evidence of free air. No significant calcification is seen with multiple pelvic phleboliths. The visceral shadows are stable. IMPRESSION: Generalized aeration of the intestinal tract without pathologic dilatation. Unchanged ileus pattern. Electronically Signed   By: Telford Nab M.D.   On: 03/23/2022 06:12    Assessment/Plan Active Problems:   ALS (amyotrophic lateral sclerosis) (HCC)   Septic shock (HCC)   Pseudomonas pneumonia (HCC)   Autonomic dysfunction   Acute on chronic respiratory failure with hypoxia (HCC)   Acute on chronic respiratory failure hypoxia plan continue with full support on mechanical ventilation home training underway ALS advanced severe disease Sepsis with shock no change Pseudomonas pneumonia treated Autonomic  dysfunction no change   I have personally seen and evaluated the patient, evaluated laboratory and imaging results, formulated the assessment and plan and placed orders. The Patient requires high complexity decision making with multiple systems involvement.  Rounds were done with the Respiratory Therapy Director and Staff therapists and discussed with nursing staff also.  Allyne Gee, MD [facility] Pulmonary  Critical Care Medicine Sleep Medicine

## 2022-03-25 DIAGNOSIS — A419 Sepsis, unspecified organism: Secondary | ICD-10-CM | POA: Diagnosis not present

## 2022-03-25 DIAGNOSIS — G1221 Amyotrophic lateral sclerosis: Secondary | ICD-10-CM | POA: Diagnosis not present

## 2022-03-25 DIAGNOSIS — J9621 Acute and chronic respiratory failure with hypoxia: Secondary | ICD-10-CM | POA: Diagnosis not present

## 2022-03-25 DIAGNOSIS — R6521 Severe sepsis with septic shock: Secondary | ICD-10-CM | POA: Diagnosis not present

## 2022-03-25 DIAGNOSIS — G909 Disorder of the autonomic nervous system, unspecified: Secondary | ICD-10-CM | POA: Diagnosis not present

## 2022-03-25 DIAGNOSIS — J151 Pneumonia due to Pseudomonas: Secondary | ICD-10-CM | POA: Diagnosis not present

## 2022-03-25 LAB — HEPARIN INDUCED PLATELET AB (HIT ANTIBODY): Heparin Induced Plt Ab: 0.065 OD (ref 0.000–0.400)

## 2022-03-26 ENCOUNTER — Other Ambulatory Visit (HOSPITAL_COMMUNITY): Payer: Self-pay

## 2022-03-26 DIAGNOSIS — J9621 Acute and chronic respiratory failure with hypoxia: Secondary | ICD-10-CM | POA: Diagnosis not present

## 2022-03-26 DIAGNOSIS — J151 Pneumonia due to Pseudomonas: Secondary | ICD-10-CM | POA: Diagnosis not present

## 2022-03-26 DIAGNOSIS — G909 Disorder of the autonomic nervous system, unspecified: Secondary | ICD-10-CM | POA: Diagnosis not present

## 2022-03-26 DIAGNOSIS — K6389 Other specified diseases of intestine: Secondary | ICD-10-CM | POA: Diagnosis not present

## 2022-03-26 DIAGNOSIS — A419 Sepsis, unspecified organism: Secondary | ICD-10-CM | POA: Diagnosis not present

## 2022-03-26 DIAGNOSIS — K567 Ileus, unspecified: Secondary | ICD-10-CM | POA: Diagnosis not present

## 2022-03-26 DIAGNOSIS — R6521 Severe sepsis with septic shock: Secondary | ICD-10-CM | POA: Diagnosis not present

## 2022-03-26 DIAGNOSIS — G1221 Amyotrophic lateral sclerosis: Secondary | ICD-10-CM | POA: Diagnosis not present

## 2022-03-26 LAB — CBC
HCT: 25.4 % — ABNORMAL LOW (ref 39.0–52.0)
Hemoglobin: 8 g/dL — ABNORMAL LOW (ref 13.0–17.0)
MCH: 27.5 pg (ref 26.0–34.0)
MCHC: 31.5 g/dL (ref 30.0–36.0)
MCV: 87.3 fL (ref 80.0–100.0)
Platelets: 150 10*3/uL (ref 150–400)
RBC: 2.91 MIL/uL — ABNORMAL LOW (ref 4.22–5.81)
RDW: 16 % — ABNORMAL HIGH (ref 11.5–15.5)
WBC: 6.3 10*3/uL (ref 4.0–10.5)
nRBC: 0 % (ref 0.0–0.2)

## 2022-03-26 LAB — RENAL FUNCTION PANEL
Albumin: 3 g/dL — ABNORMAL LOW (ref 3.5–5.0)
Anion gap: 6 (ref 5–15)
BUN: 25 mg/dL — ABNORMAL HIGH (ref 6–20)
CO2: 23 mmol/L (ref 22–32)
Calcium: 9.2 mg/dL (ref 8.9–10.3)
Chloride: 110 mmol/L (ref 98–111)
Creatinine, Ser: <0.3 mg/dL — ABNORMAL LOW (ref 0.61–1.24)
Glucose, Bld: 110 mg/dL — ABNORMAL HIGH (ref 70–99)
Phosphorus: 3.5 mg/dL (ref 2.5–4.6)
Potassium: 3.6 mmol/L (ref 3.5–5.1)
Sodium: 139 mmol/L (ref 135–145)

## 2022-03-26 LAB — PHOSPHORUS: Phosphorus: 3.5 mg/dL (ref 2.5–4.6)

## 2022-03-26 LAB — MAGNESIUM: Magnesium: 1.9 mg/dL (ref 1.7–2.4)

## 2022-03-26 NOTE — Progress Notes (Addendum)
Pulmonary Critical Care Medicine [place]   PULMONARY CRITICAL CARE SERVICE  PROGRESS NOTE     Jerry [name]  [id number]:[id number]  DOB: [birth year]   DOA: 01/08/2022  Referring Physician: Satira Sark, MD  HPI: Jerry [name] is a 51 y.o. male being followed for ventilator/airway/oxygen weaning Acute on Chronic Respiratory Failure.  Patient seen lying in bed, currently remains on baseline of full support.  Respiratory therapy continues training with family.  Medications: Reviewed on Rounds  Physical Exam:  Vitals: Temp 97.0, pulse 58, respirations 18, BP 125/72, SPO2 100%  Ventilator Settings AC VC, FiO2 20%, tidal volume 500, rate 16, PEEP 5  General: Comfortable at this time Neck: supple Cardiovascular: no malignant arrhythmias Respiratory: Bilaterally clear Skin: no rash seen on limited exam Musculoskeletal: No gross abnormality Psychiatric:unable to assess Neurologic:no involuntary movements         Lab Data:   Basic Metabolic Panel: Recent Labs  Lab 03/20/22 0404 03/22/22 0641 03/23/22 0449 03/26/22 0424  NA  --  139 139 139  K  --  3.8 3.6 3.6  CL  --  111 110 110  CO2  --  '22 22 23  '$ GLUCOSE  --  111* 107* 110*  BUN  --  22* 20 25*  CREATININE  --  <0.30* <0.30* <0.30*  CALCIUM  --  9.2 9.3 9.2  MG 1.8  --  1.9 1.9  PHOS 3.9  --  3.8 3.5  3.5    ABG: No results for input(s): "PHART", "PCO2ART", "PO2ART", "HCO3", "O2SAT" in the last 168 hours.  Liver Function Tests: Recent Labs  Lab 03/26/22 0424  ALBUMIN 3.0*   No results for input(s): "LIPASE", "AMYLASE" in the last 168 hours. No results for input(s): "AMMONIA" in the last 168 hours.  CBC: Recent Labs  Lab 03/22/22 0641 03/23/22 0449 03/26/22 0424  WBC 4.1 5.8 6.3  HGB 8.0* 9.5* 8.0*  HCT 26.1* 30.3* 25.4*  MCV 89.1 88.9 87.3  PLT 124* PLATELET CLUMPS NOTED ON SMEAR, COUNT APPEARS ADEQUATE 150    Cardiac Enzymes: No results for input(s): "CKTOTAL", "CKMB",  "CKMBINDEX", "TROPONINI" in the last 168 hours.  BNP (last 3 results) No results for input(s): "BNP" in the last 8760 hours.  ProBNP (last 3 results) No results for input(s): "PROBNP" in the last 8760 hours.  Radiological Exams: DG Abd Portable 1V  Result Date: 03/26/2022 CLINICAL DATA:  51 year old male history of ileus. EXAM: PORTABLE ABDOMEN - 1 VIEW COMPARISON:  Multiple priors, most recently 03/23/2022. FINDINGS: Mild diffuse gaseous distention of small bowel and colon, including distal rectal gas, similar to prior examinations. No pneumoperitoneum. Catheter projecting over the low anatomic pelvis. IMPRESSION: 1. Unchanged bowel-gas pattern once again most suggestive of an ileus. Electronically Signed   By: Vinnie Langton M.D.   On: 03/26/2022 05:37    Assessment/Plan Active Problems:   ALS (amyotrophic lateral sclerosis) (HCC)   Septic shock (HCC)   Pseudomonas pneumonia (HCC)   Autonomic dysfunction   Acute on chronic respiratory failure with hypoxia (HCC)   Acute on chronic respiratory failure hypoxia-remains on baseline of full support.  Continue with pulmonary toilet. Status post tracheostomy-remains in place and is stable.  Continue trach care per protocol.  No plans for decannulation. ALS severe advanced disease-continue with supportive care. Pseudomonas- resolved.  Continue with supportive care. Autonomic dysfunction- at baseline right now.     I have personally seen and evaluated the patient, evaluated laboratory and imaging results, formulated the assessment and plan  and placed orders. The Patient requires high complexity decision making with multiple systems involvement.  Rounds were done with the Respiratory Therapy Director and Staff therapists and discussed with nursing staff also.  Allyne Gee, MD [facility] Pulmonary Critical Care Medicine Sleep Medicine

## 2022-03-26 NOTE — Progress Notes (Incomplete)
Pulmonary Critical Care Medicine [place]   PULMONARY CRITICAL CARE SERVICE  PROGRESS NOTE     Jerry [name]  [id number]:[id number]  DOB: 07/25/1970   DOA: 01/08/2022  Referring Physician: Satira Sark, MD  HPI: Jerry [name] is a 51 y.o. male being followed for ventilator/airway/oxygen weaning Acute on Chronic Respiratory Failure. ***  Medications: Reviewed on Rounds  Physical Exam:  Vitals: ***  Ventilator Settings ***  General: Comfortable at this time Neck: supple Cardiovascular: no malignant arrhythmias Respiratory: *** Skin: no rash seen on limited exam Musculoskeletal: No gross abnormality Psychiatric:unable to assess Neurologic:no involuntary movements         Lab Data:   Basic Metabolic Panel: Recent Labs  Lab 03/20/22 0404 03/22/22 0641 03/23/22 0449 03/26/22 0424  NA  --  139 139 139  K  --  3.8 3.6 3.6  CL  --  111 110 110  CO2  --  '22 22 23  '$ GLUCOSE  --  111* 107* 110*  BUN  --  22* 20 25*  CREATININE  --  <0.30* <0.30* <0.30*  CALCIUM  --  9.2 9.3 9.2  MG 1.8  --  1.9 1.9  PHOS 3.9  --  3.8 3.5  3.5    ABG: No results for input(s): "PHART", "PCO2ART", "PO2ART", "HCO3", "O2SAT" in the last 168 hours.  Liver Function Tests: Recent Labs  Lab 03/26/22 0424  ALBUMIN 3.0*   No results for input(s): "LIPASE", "AMYLASE" in the last 168 hours. No results for input(s): "AMMONIA" in the last 168 hours.  CBC: Recent Labs  Lab 03/22/22 0641 03/23/22 0449 03/26/22 0424  WBC 4.1 5.8 6.3  HGB 8.0* 9.5* 8.0*  HCT 26.1* 30.3* 25.4*  MCV 89.1 88.9 87.3  PLT 124* PLATELET CLUMPS NOTED ON SMEAR, COUNT APPEARS ADEQUATE 150    Cardiac Enzymes: No results for input(s): "CKTOTAL", "CKMB", "CKMBINDEX", "TROPONINI" in the last 168 hours.  BNP (last 3 results) No results for input(s): "BNP" in the last 8760 hours.  ProBNP (last 3 results) No results for input(s): "PROBNP" in the last 8760 hours.  Radiological Exams: DG Abd  Portable 1V  Result Date: 03/26/2022 CLINICAL DATA:  51 year old male history of ileus. EXAM: PORTABLE ABDOMEN - 1 VIEW COMPARISON:  Multiple priors, most recently 03/23/2022. FINDINGS: Mild diffuse gaseous distention of small bowel and colon, including distal rectal gas, similar to prior examinations. No pneumoperitoneum. Catheter projecting over the low anatomic pelvis. IMPRESSION: 1. Unchanged bowel-gas pattern once again most suggestive of an ileus. Electronically Signed   By: Vinnie Langton M.D.   On: 03/26/2022 05:37    Assessment/Plan Active Problems:   ALS (amyotrophic lateral sclerosis) (HCC)   Septic shock (HCC)   Pseudomonas pneumonia (HCC)   Autonomic dysfunction   Acute on chronic respiratory failure with hypoxia (HCC)   Acute on chronic respiratory failure hypoxia-remains on baseline of full support.  Continue with pulmonary toilet. Status post tracheostomy-remains in place and is stable.  Continue trach care per protocol.  No plans for decannulation. ALS severe advanced disease-continue with supportive care. Pseudomonas- resolved.  Continue with supportive care. Autonomic dysfunction- at baseline right now.   I have personally seen and evaluated the patient, evaluated laboratory and imaging results, formulated the assessment and plan and placed orders. The Patient requires high complexity decision making with multiple systems involvement.  Rounds were done with the Respiratory Therapy Director and Staff therapists and discussed with nursing staff also.  Allyne Gee, MD [facility] Pulmonary Critical Care  Medicine Sleep Medicine

## 2022-03-26 NOTE — Progress Notes (Addendum)
Pulmonary Critical Care Medicine [place]   PULMONARY CRITICAL CARE SERVICE  PROGRESS NOTE     Mccauley Diehl  LKG:[id number]  DOB: 01/20/1971   DOA: 01/08/2022  Referring Physician: Satira Sark, MD  HPI: Quashawn Jewkes is a 51 y.o. male being followed for ventilator/airway/oxygen weaning Acute on Chronic Respiratory Failure.  Patient seen lying in bed, currently remains on baseline of full support ventilation.  According to nursing staff patient's ileus is still present.  Medications: Reviewed on Rounds  Physical Exam:  Vitals: Temp 98.2, pulse 71, respirations 20, BP 95/49, SPO2 100%  Ventilator Settings AC VC, FiO2 20%, tidal volume 500, rate 16, PEEP 5  General: Comfortable at this time Neck: supple Cardiovascular: no malignant arrhythmias Respiratory: Bilaterally diminished Skin: no rash seen on limited exam Musculoskeletal: No gross abnormality Psychiatric:unable to assess Neurologic:no involuntary movements         Lab Data:   Basic Metabolic Panel: Recent Labs  Lab 03/20/22 0404 03/22/22 0641 03/23/22 0449 03/26/22 0424  NA  --  139 139 139  K  --  3.8 3.6 3.6  CL  --  111 110 110  CO2  --  '22 22 23  '$ GLUCOSE  --  111* 107* 110*  BUN  --  22* 20 25*  CREATININE  --  <0.30* <0.30* <0.30*  CALCIUM  --  9.2 9.3 9.2  MG 1.8  --  1.9 1.9  PHOS 3.9  --  3.8 3.5  3.5    ABG: No results for input(s): "PHART", "PCO2ART", "PO2ART", "HCO3", "O2SAT" in the last 168 hours.  Liver Function Tests: Recent Labs  Lab 03/26/22 0424  ALBUMIN 3.0*   No results for input(s): "LIPASE", "AMYLASE" in the last 168 hours. No results for input(s): "AMMONIA" in the last 168 hours.  CBC: Recent Labs  Lab 03/22/22 0641 03/23/22 0449 03/26/22 0424  WBC 4.1 5.8 6.3  HGB 8.0* 9.5* 8.0*  HCT 26.1* 30.3* 25.4*  MCV 89.1 88.9 87.3  PLT 124* PLATELET CLUMPS NOTED ON SMEAR, COUNT APPEARS ADEQUATE 150    Cardiac Enzymes: No results for input(s):  "CKTOTAL", "CKMB", "CKMBINDEX", "TROPONINI" in the last 168 hours.  BNP (last 3 results) No results for input(s): "BNP" in the last 8760 hours.  ProBNP (last 3 results) No results for input(s): "PROBNP" in the last 8760 hours.  Radiological Exams: DG Abd Portable 1V  Result Date: 03/26/2022 CLINICAL DATA:  51 year old male history of ileus. EXAM: PORTABLE ABDOMEN - 1 VIEW COMPARISON:  Multiple priors, most recently 03/23/2022. FINDINGS: Mild diffuse gaseous distention of small bowel and colon, including distal rectal gas, similar to prior examinations. No pneumoperitoneum. Catheter projecting over the low anatomic pelvis. IMPRESSION: 1. Unchanged bowel-gas pattern once again most suggestive of an ileus. Electronically Signed   By: Vinnie Langton M.D.   On: 03/26/2022 05:37    Assessment/Plan Active Problems:   ALS (amyotrophic lateral sclerosis) (HCC)   Septic shock (HCC)   Pseudomonas pneumonia (HCC)   Autonomic dysfunction   Acute on chronic respiratory failure with hypoxia (HCC)   Acute on chronic respiratory failure hypoxia-remains on baseline of full support.  Continue with pulmonary toilet. Status post tracheostomy-remains in place and is stable.  Continue trach care per protocol.  No plans for decannulation. ALS severe advanced disease-continue with supportive care. Pseudomonas- resolved.  Continue with supportive care. Autonomic dysfunction- at baseline right now.    I have personally seen and evaluated the patient, evaluated laboratory and imaging results, formulated the  assessment and plan and placed orders. The Patient requires high complexity decision making with multiple systems involvement.  Rounds were done with the Respiratory Therapy Director and Staff therapists and discussed with nursing staff also.  Allyne Gee, MD [facility] Pulmonary Critical Care Medicine Sleep Medicine

## 2022-03-27 ENCOUNTER — Other Ambulatory Visit (HOSPITAL_COMMUNITY): Payer: Self-pay

## 2022-03-27 DIAGNOSIS — G909 Disorder of the autonomic nervous system, unspecified: Secondary | ICD-10-CM | POA: Diagnosis not present

## 2022-03-27 DIAGNOSIS — J9621 Acute and chronic respiratory failure with hypoxia: Secondary | ICD-10-CM | POA: Diagnosis not present

## 2022-03-27 DIAGNOSIS — J151 Pneumonia due to Pseudomonas: Secondary | ICD-10-CM | POA: Diagnosis not present

## 2022-03-27 DIAGNOSIS — A419 Sepsis, unspecified organism: Secondary | ICD-10-CM | POA: Diagnosis not present

## 2022-03-27 DIAGNOSIS — R6521 Severe sepsis with septic shock: Secondary | ICD-10-CM | POA: Diagnosis not present

## 2022-03-27 DIAGNOSIS — G1221 Amyotrophic lateral sclerosis: Secondary | ICD-10-CM | POA: Diagnosis not present

## 2022-03-27 NOTE — Progress Notes (Cosign Needed)
Pulmonary Critical Care Medicine [place]   PULMONARY CRITICAL CARE SERVICE  PROGRESS NOTE     Chief Walkup  [id number]:[id number]  DOB: February 21, 1971   DOA: 01/08/2022  Referring Physician: Satira Sark, MD  HPI: Jerry [name] is a 51 y.o. male being followed for ventilator/airway/oxygen weaning Acute on Chronic Respiratory Failure.  Patient seen lying in bed, currently remains on baseline of full support.  Family is coming in today to work with respiratory therapy for trach care.  Medications: Reviewed on Rounds  Physical Exam:  Vitals: Temp 97.0, pulse 57, respirations 18, BP 104 54, SPO2 100%  Ventilator Settings AC VC, FiO2 28%, tidal volume 500, rate 16, PEEP 5  General: Comfortable at this time Neck: supple Cardiovascular: no malignant arrhythmias Respiratory: Bilaterally diminished Skin: no rash seen on limited exam Musculoskeletal: No gross abnormality Psychiatric:unable to assess Neurologic:no involuntary movements         Lab Data:   Basic Metabolic Panel: Recent Labs  Lab 03/22/22 0641 03/23/22 0449 03/26/22 0424  NA 139 139 139  K 3.8 3.6 3.6  CL 111 110 110  CO2 '22 22 23  '$ GLUCOSE 111* 107* 110*  BUN 22* 20 25*  CREATININE <0.30* <0.30* <0.30*  CALCIUM 9.2 9.3 9.2  MG  --  1.9 1.9  PHOS  --  3.8 3.5  3.5    ABG: No results for input(s): "PHART", "PCO2ART", "PO2ART", "HCO3", "O2SAT" in the last 168 hours.  Liver Function Tests: Recent Labs  Lab 03/26/22 0424  ALBUMIN 3.0*   No results for input(s): "LIPASE", "AMYLASE" in the last 168 hours. No results for input(s): "AMMONIA" in the last 168 hours.  CBC: Recent Labs  Lab 03/22/22 0641 03/23/22 0449 03/26/22 0424  WBC 4.1 5.8 6.3  HGB 8.0* 9.5* 8.0*  HCT 26.1* 30.3* 25.4*  MCV 89.1 88.9 87.3  PLT 124* PLATELET CLUMPS NOTED ON SMEAR, COUNT APPEARS ADEQUATE 150    Cardiac Enzymes: No results for input(s): "CKTOTAL", "CKMB", "CKMBINDEX", "TROPONINI" in the last 168  hours.  BNP (last 3 results) No results for input(s): "BNP" in the last 8760 hours.  ProBNP (last 3 results) No results for input(s): "PROBNP" in the last 8760 hours.  Radiological Exams: DG Abd Portable 1V  Result Date: 03/26/2022 CLINICAL DATA:  51 year old male history of ileus. EXAM: PORTABLE ABDOMEN - 1 VIEW COMPARISON:  Multiple priors, most recently 03/23/2022. FINDINGS: Mild diffuse gaseous distention of small bowel and colon, including distal rectal gas, similar to prior examinations. No pneumoperitoneum. Catheter projecting over the low anatomic pelvis. IMPRESSION: 1. Unchanged bowel-gas pattern once again most suggestive of an ileus. Electronically Signed   By: Vinnie Langton M.D.   On: 03/26/2022 05:37    Assessment/Plan Active Problems:   ALS (amyotrophic lateral sclerosis) (HCC)   Septic shock (HCC)   Pseudomonas pneumonia (HCC)   Autonomic dysfunction   Acute on chronic respiratory failure with hypoxia (HCC)   Acute on chronic respiratory failure hypoxia-remains on baseline of full support.  Continue with pulmonary toilet. Status post tracheostomy-remains in place and is stable.  Continue trach care per protocol.  No plans for decannulation. ALS severe advanced disease-continue with supportive care. Pseudomonas- resolved.  Continue with supportive care. Autonomic dysfunction- at baseline right now.   I have personally seen and evaluated the patient, evaluated laboratory and imaging results, formulated the assessment and plan and placed orders. The Patient requires high complexity decision making with multiple systems involvement.  Rounds were done with the Respiratory Therapy  Librarian, academic therapists and discussed with nursing staff also.  Allyne Gee, MD [facility] Pulmonary Critical Care Medicine Sleep Medicine

## 2022-03-28 ENCOUNTER — Other Ambulatory Visit (HOSPITAL_COMMUNITY): Payer: Self-pay

## 2022-03-28 DIAGNOSIS — K56609 Unspecified intestinal obstruction, unspecified as to partial versus complete obstruction: Secondary | ICD-10-CM | POA: Diagnosis not present

## 2022-03-28 DIAGNOSIS — G909 Disorder of the autonomic nervous system, unspecified: Secondary | ICD-10-CM | POA: Diagnosis not present

## 2022-03-28 DIAGNOSIS — A419 Sepsis, unspecified organism: Secondary | ICD-10-CM | POA: Diagnosis not present

## 2022-03-28 DIAGNOSIS — J151 Pneumonia due to Pseudomonas: Secondary | ICD-10-CM | POA: Diagnosis not present

## 2022-03-28 DIAGNOSIS — R600 Localized edema: Secondary | ICD-10-CM | POA: Diagnosis not present

## 2022-03-28 DIAGNOSIS — G1221 Amyotrophic lateral sclerosis: Secondary | ICD-10-CM | POA: Diagnosis not present

## 2022-03-28 DIAGNOSIS — N200 Calculus of kidney: Secondary | ICD-10-CM | POA: Diagnosis not present

## 2022-03-28 DIAGNOSIS — R6521 Severe sepsis with septic shock: Secondary | ICD-10-CM | POA: Diagnosis not present

## 2022-03-28 DIAGNOSIS — K6389 Other specified diseases of intestine: Secondary | ICD-10-CM | POA: Diagnosis not present

## 2022-03-28 DIAGNOSIS — J9621 Acute and chronic respiratory failure with hypoxia: Secondary | ICD-10-CM | POA: Diagnosis not present

## 2022-03-29 DIAGNOSIS — G909 Disorder of the autonomic nervous system, unspecified: Secondary | ICD-10-CM | POA: Diagnosis not present

## 2022-03-29 DIAGNOSIS — J9621 Acute and chronic respiratory failure with hypoxia: Secondary | ICD-10-CM | POA: Diagnosis not present

## 2022-03-29 DIAGNOSIS — J151 Pneumonia due to Pseudomonas: Secondary | ICD-10-CM | POA: Diagnosis not present

## 2022-03-29 DIAGNOSIS — R6521 Severe sepsis with septic shock: Secondary | ICD-10-CM | POA: Diagnosis not present

## 2022-03-29 DIAGNOSIS — G1221 Amyotrophic lateral sclerosis: Secondary | ICD-10-CM | POA: Diagnosis not present

## 2022-03-29 DIAGNOSIS — A419 Sepsis, unspecified organism: Secondary | ICD-10-CM | POA: Diagnosis not present

## 2022-03-29 LAB — MAGNESIUM: Magnesium: 1.7 mg/dL (ref 1.7–2.4)

## 2022-03-29 LAB — BASIC METABOLIC PANEL
Anion gap: 4 — ABNORMAL LOW (ref 5–15)
BUN: 21 mg/dL — ABNORMAL HIGH (ref 6–20)
CO2: 24 mmol/L (ref 22–32)
Calcium: 9.1 mg/dL (ref 8.9–10.3)
Chloride: 109 mmol/L (ref 98–111)
Creatinine, Ser: <0.3 mg/dL — ABNORMAL LOW (ref 0.61–1.24)
Glucose, Bld: 124 mg/dL — ABNORMAL HIGH (ref 70–99)
Potassium: 3.5 mmol/L (ref 3.5–5.1)
Sodium: 137 mmol/L (ref 135–145)

## 2022-03-29 LAB — PHOSPHORUS: Phosphorus: 3.6 mg/dL (ref 2.5–4.6)

## 2022-03-29 LAB — CBC
HCT: 27.4 % — ABNORMAL LOW (ref 39.0–52.0)
Hemoglobin: 8.4 g/dL — ABNORMAL LOW (ref 13.0–17.0)
MCH: 27.3 pg (ref 26.0–34.0)
MCHC: 30.7 g/dL (ref 30.0–36.0)
MCV: 89 fL (ref 80.0–100.0)
Platelets: 173 10*3/uL (ref 150–400)
RBC: 3.08 MIL/uL — ABNORMAL LOW (ref 4.22–5.81)
RDW: 16 % — ABNORMAL HIGH (ref 11.5–15.5)
WBC: 5.3 10*3/uL (ref 4.0–10.5)
nRBC: 0 % (ref 0.0–0.2)

## 2022-03-29 NOTE — Progress Notes (Cosign Needed)
Pulmonary Critical Care Medicine [place]   PULMONARY CRITICAL CARE SERVICE  PROGRESS NOTE     Jerry [name]  WFU:[id number]  DOB: [birth year]   DOA: 01/08/2022  Referring Physician: Satira Sark, MD  HPI: Jerry [name] is a 51 y.o. male being followed for ventilator/airway/oxygen weaning Acute on Chronic Respiratory Failure.  Patient seen lying in bed, currently remains on full support ventilation which is his baseline.  No overnight events noted.  Medications: Reviewed on Rounds  Physical Exam:  Vitals: Temp 97.6, pulse 69, respirations 27, BP 90/59, SPO2 100%  Ventilator Settings AC VC, FiO2 28%, tidal volume 500, rate 16, PEEP 5  General: Comfortable at this time Neck: supple Cardiovascular: no malignant arrhythmias Respiratory: Bilaterally diminished Skin: no rash seen on limited exam Musculoskeletal: No gross abnormality Psychiatric:unable to assess Neurologic:no involuntary movements         Lab Data:   Basic Metabolic Panel: Recent Labs  Lab 03/23/22 0449 03/26/22 0424 03/29/22 0630  NA 139 139 137  K 3.6 3.6 3.5  CL 110 110 109  CO2 '22 23 24  '$ GLUCOSE 107* 110* 124*  BUN 20 25* 21*  CREATININE <0.30* <0.30* <0.30*  CALCIUM 9.3 9.2 9.1  MG 1.9 1.9 1.7  PHOS 3.8 3.5  3.5 3.6    ABG: No results for input(s): "PHART", "PCO2ART", "PO2ART", "HCO3", "O2SAT" in the last 168 hours.  Liver Function Tests: Recent Labs  Lab 03/26/22 0424  ALBUMIN 3.0*   No results for input(s): "LIPASE", "AMYLASE" in the last 168 hours. No results for input(s): "AMMONIA" in the last 168 hours.  CBC: Recent Labs  Lab 03/23/22 0449 03/26/22 0424 03/29/22 0630  WBC 5.8 6.3 5.3  HGB 9.5* 8.0* 8.4*  HCT 30.3* 25.4* 27.4*  MCV 88.9 87.3 89.0  PLT PLATELET CLUMPS NOTED ON SMEAR, COUNT APPEARS ADEQUATE 150 173    Cardiac Enzymes: No results for input(s): "CKTOTAL", "CKMB", "CKMBINDEX", "TROPONINI" in the last 168 hours.  BNP (last 3  results) No results for input(s): "BNP" in the last 8760 hours.  ProBNP (last 3 results) No results for input(s): "PROBNP" in the last 8760 hours.  Radiological Exams: CT ABDOMEN PELVIS WO CONTRAST  Result Date: 03/28/2022 CLINICAL DATA:  Bowel obstruction EXAM: CT ABDOMEN AND PELVIS WITHOUT CONTRAST TECHNIQUE: Multidetector CT imaging of the abdomen and pelvis was performed following the standard protocol without IV contrast. RADIATION DOSE REDUCTION: This exam was performed according to the departmental dose-optimization program which includes automated exposure control, adjustment of the mA and/or kV according to patient size and/or use of iterative reconstruction technique. COMPARISON:  12/09/2021 FINDINGS: Lower chest: Nodular bibasilar pulmonary infiltrates are present within the lower lobes in keeping with multifocal infection or aspiration. Small left pleural effusion. Left basilar atelectasis. Stable elevation of the left hemidiaphragm. Central venous catheter tip noted within the right atrium. Cardiac size within normal limits. Hepatobiliary: No focal liver abnormality is seen. No gallstones, gallbladder wall thickening, or biliary dilatation. Pancreas: Unremarkable Spleen: Unremarkable Adrenals/Urinary Tract: The adrenal glands are unremarkable. The kidneys are normal in size and position. Multiple nonobstructing calculi are identified within the kidneys bilaterally measuring up to 4 mm. No ureteral calculi. No hydronephrosis. The bladder is unremarkable. Stomach/Bowel: Pull-through gastrostomy catheter in expected position within the a mid to distal gastric body. Prominent gas-filled loops of large and small bowel are seen throughout the abdomen without a discrete point of transition identified suggesting underlying ileus. No focal inflammatory change identified. The appendix is not clearly identified.  No free intraperitoneal gas or fluid. Vascular/Lymphatic: No significant vascular findings are  present. No enlarged abdominal or pelvic lymph nodes. Reproductive: Prostate is unremarkable. Other: No abdominal wall hernia. Mild diffuse subcutaneous body wall edema. Rectal catheter in place. Musculoskeletal: No acute bone abnormality. No lytic or blastic bone lesion. IMPRESSION: 1. Prominent gas-filled loops of large and small bowel throughout the abdomen without a discrete point of transition identified suggesting underlying ileus. 2. Nodular bibasilar pulmonary infiltrates in keeping with multifocal infection or aspiration. Small left pleural effusion. 3. Mild bilateral nonobstructing nephrolithiasis. Electronically Signed   By: Fidela Salisbury M.D.   On: 03/28/2022 00:42    Assessment/Plan Active Problems:   ALS (amyotrophic lateral sclerosis) (HCC)   Septic shock (HCC)   Pseudomonas pneumonia (HCC)   Autonomic dysfunction   Acute on chronic respiratory failure with hypoxia (HCC)  Acute on chronic respiratory failure hypoxia-remains on baseline of full support.  Continue with pulmonary toilet. Status post tracheostomy-remains in place and is stable.  Continue trach care per protocol.  No plans for decannulation. ALS severe advanced disease-continue with supportive care. Pseudomonas- resolved.  Continue with supportive care. Autonomic dysfunction- at baseline right now.   I have personally seen and evaluated the patient, evaluated laboratory and imaging results, formulated the assessment and plan and placed orders. The Patient requires high complexity decision making with multiple systems involvement.  Rounds were done with the Respiratory Therapy Director and Staff therapists and discussed with nursing staff also.  Allyne Gee, MD [facility] Pulmonary Critical Care Medicine Sleep Medicine

## 2022-03-29 NOTE — Progress Notes (Cosign Needed)
Pulmonary Critical Care Medicine [place]   PULMONARY CRITICAL CARE SERVICE  PROGRESS NOTE     Jerry [name]  [id number]:[id number]  DOB: 20-Oct-1970   DOA: 01/08/2022  Referring Physician: Satira Sark, MD  HPI: Jerry [name] is a 51 y.o. male being followed for ventilator/airway/oxygen weaning Acute on Chronic Respiratory Failure.  Patient seen lying in bed, currently remains on AC/VC full support which is his baseline.  Family continues to train with respiratory therapy for trach care management.  Medications: Reviewed on Rounds  Physical Exam:  Vitals: Temp 97.5, pulse 60, respirations 18, BP 108/59, SPO2 100%  Ventilator Settings AC VC, FiO2 28%, tidal volume 500, rate 16, PEEP 5  General: Comfortable at this time Neck: supple Cardiovascular: no malignant arrhythmias Respiratory: Bilaterally diminished Skin: no rash seen on limited exam Musculoskeletal: No gross abnormality Psychiatric:unable to assess Neurologic:no involuntary movements         Lab Data:   Basic Metabolic Panel: Recent Labs  Lab 03/23/22 0449 03/26/22 0424 03/29/22 0630  NA 139 139 137  K 3.6 3.6 3.5  CL 110 110 109  CO2 '22 23 24  '$ GLUCOSE 107* 110* 124*  BUN 20 25* 21*  CREATININE <0.30* <0.30* <0.30*  CALCIUM 9.3 9.2 9.1  MG 1.9 1.9 1.7  PHOS 3.8 3.5  3.5 3.6    ABG: No results for input(s): "PHART", "PCO2ART", "PO2ART", "HCO3", "O2SAT" in the last 168 hours.  Liver Function Tests: Recent Labs  Lab 03/26/22 0424  ALBUMIN 3.0*   No results for input(s): "LIPASE", "AMYLASE" in the last 168 hours. No results for input(s): "AMMONIA" in the last 168 hours.  CBC: Recent Labs  Lab 03/23/22 0449 03/26/22 0424 03/29/22 0630  WBC 5.8 6.3 5.3  HGB 9.5* 8.0* 8.4*  HCT 30.3* 25.4* 27.4*  MCV 88.9 87.3 89.0  PLT PLATELET CLUMPS NOTED ON SMEAR, COUNT APPEARS ADEQUATE 150 173    Cardiac Enzymes: No results for input(s): "CKTOTAL", "CKMB", "CKMBINDEX", "TROPONINI"  in the last 168 hours.  BNP (last 3 results) No results for input(s): "BNP" in the last 8760 hours.  ProBNP (last 3 results) No results for input(s): "PROBNP" in the last 8760 hours.  Radiological Exams: CT ABDOMEN PELVIS WO CONTRAST  Result Date: 03/28/2022 CLINICAL DATA:  Bowel obstruction EXAM: CT ABDOMEN AND PELVIS WITHOUT CONTRAST TECHNIQUE: Multidetector CT imaging of the abdomen and pelvis was performed following the standard protocol without IV contrast. RADIATION DOSE REDUCTION: This exam was performed according to the departmental dose-optimization program which includes automated exposure control, adjustment of the mA and/or kV according to patient size and/or use of iterative reconstruction technique. COMPARISON:  12/09/2021 FINDINGS: Lower chest: Nodular bibasilar pulmonary infiltrates are present within the lower lobes in keeping with multifocal infection or aspiration. Small left pleural effusion. Left basilar atelectasis. Stable elevation of the left hemidiaphragm. Central venous catheter tip noted within the right atrium. Cardiac size within normal limits. Hepatobiliary: No focal liver abnormality is seen. No gallstones, gallbladder wall thickening, or biliary dilatation. Pancreas: Unremarkable Spleen: Unremarkable Adrenals/Urinary Tract: The adrenal glands are unremarkable. The kidneys are normal in size and position. Multiple nonobstructing calculi are identified within the kidneys bilaterally measuring up to 4 mm. No ureteral calculi. No hydronephrosis. The bladder is unremarkable. Stomach/Bowel: Pull-through gastrostomy catheter in expected position within the a mid to distal gastric body. Prominent gas-filled loops of large and small bowel are seen throughout the abdomen without a discrete point of transition identified suggesting underlying ileus. No focal inflammatory change  identified. The appendix is not clearly identified. No free intraperitoneal gas or fluid.  Vascular/Lymphatic: No significant vascular findings are present. No enlarged abdominal or pelvic lymph nodes. Reproductive: Prostate is unremarkable. Other: No abdominal wall hernia. Mild diffuse subcutaneous body wall edema. Rectal catheter in place. Musculoskeletal: No acute bone abnormality. No lytic or blastic bone lesion. IMPRESSION: 1. Prominent gas-filled loops of large and small bowel throughout the abdomen without a discrete point of transition identified suggesting underlying ileus. 2. Nodular bibasilar pulmonary infiltrates in keeping with multifocal infection or aspiration. Small left pleural effusion. 3. Mild bilateral nonobstructing nephrolithiasis. Electronically Signed   By: Fidela Salisbury M.D.   On: 03/28/2022 00:42    Assessment/Plan Active Problems:   ALS (amyotrophic lateral sclerosis) (HCC)   Septic shock (HCC)   Pseudomonas pneumonia (HCC)   Autonomic dysfunction   Acute on chronic respiratory failure with hypoxia (HCC)  Acute on chronic respiratory failure hypoxia-remains on baseline of full support.  Continue with pulmonary toilet. Status post tracheostomy-remains in place and is stable.  Continue trach care per protocol.  No plans for decannulation. ALS severe advanced disease-continue with supportive care. Pseudomonas- resolved.  Continue with supportive care. Autonomic dysfunction- at baseline right now.   I have personally seen and evaluated the patient, evaluated laboratory and imaging results, formulated the assessment and plan and placed orders. The Patient requires high complexity decision making with multiple systems involvement.  Rounds were done with the Respiratory Therapy Director and Staff therapists and discussed with nursing staff also.  Allyne Gee, MD [facility] Pulmonary Critical Care Medicine Sleep Medicine

## 2022-03-30 DIAGNOSIS — R6521 Severe sepsis with septic shock: Secondary | ICD-10-CM | POA: Diagnosis not present

## 2022-03-30 DIAGNOSIS — G909 Disorder of the autonomic nervous system, unspecified: Secondary | ICD-10-CM | POA: Diagnosis not present

## 2022-03-30 DIAGNOSIS — J9621 Acute and chronic respiratory failure with hypoxia: Secondary | ICD-10-CM | POA: Diagnosis not present

## 2022-03-30 DIAGNOSIS — A419 Sepsis, unspecified organism: Secondary | ICD-10-CM | POA: Diagnosis not present

## 2022-03-30 DIAGNOSIS — J151 Pneumonia due to Pseudomonas: Secondary | ICD-10-CM | POA: Diagnosis not present

## 2022-03-30 DIAGNOSIS — G1221 Amyotrophic lateral sclerosis: Secondary | ICD-10-CM | POA: Diagnosis not present

## 2022-03-30 LAB — MAGNESIUM: Magnesium: 1.8 mg/dL (ref 1.7–2.4)

## 2022-03-31 ENCOUNTER — Other Ambulatory Visit (HOSPITAL_COMMUNITY): Payer: Self-pay

## 2022-03-31 DIAGNOSIS — J151 Pneumonia due to Pseudomonas: Secondary | ICD-10-CM | POA: Diagnosis not present

## 2022-03-31 DIAGNOSIS — R6521 Severe sepsis with septic shock: Secondary | ICD-10-CM | POA: Diagnosis not present

## 2022-03-31 DIAGNOSIS — I878 Other specified disorders of veins: Secondary | ICD-10-CM | POA: Diagnosis not present

## 2022-03-31 DIAGNOSIS — G1221 Amyotrophic lateral sclerosis: Secondary | ICD-10-CM | POA: Diagnosis not present

## 2022-03-31 DIAGNOSIS — Z4682 Encounter for fitting and adjustment of non-vascular catheter: Secondary | ICD-10-CM | POA: Diagnosis not present

## 2022-03-31 DIAGNOSIS — J9621 Acute and chronic respiratory failure with hypoxia: Secondary | ICD-10-CM | POA: Diagnosis not present

## 2022-03-31 DIAGNOSIS — R140 Abdominal distension (gaseous): Secondary | ICD-10-CM | POA: Diagnosis not present

## 2022-03-31 DIAGNOSIS — G909 Disorder of the autonomic nervous system, unspecified: Secondary | ICD-10-CM | POA: Diagnosis not present

## 2022-03-31 DIAGNOSIS — A419 Sepsis, unspecified organism: Secondary | ICD-10-CM | POA: Diagnosis not present

## 2022-03-31 LAB — BASIC METABOLIC PANEL
Anion gap: 10 (ref 5–15)
BUN: 18 mg/dL (ref 6–20)
CO2: 20 mmol/L — ABNORMAL LOW (ref 22–32)
Calcium: 9.1 mg/dL (ref 8.9–10.3)
Chloride: 109 mmol/L (ref 98–111)
Creatinine, Ser: <0.3 mg/dL — ABNORMAL LOW (ref 0.61–1.24)
Glucose, Bld: 115 mg/dL — ABNORMAL HIGH (ref 70–99)
Potassium: 3.7 mmol/L (ref 3.5–5.1)
Sodium: 139 mmol/L (ref 135–145)

## 2022-03-31 LAB — TRIGLYCERIDES: Triglycerides: 28 mg/dL (ref <150)

## 2022-03-31 LAB — MAGNESIUM: Magnesium: 1.8 mg/dL (ref 1.7–2.4)

## 2022-03-31 NOTE — Progress Notes (Addendum)
Pulmonary Critical Care Medicine [place]   PULMONARY CRITICAL CARE SERVICE  PROGRESS NOTE     Lakendrick Paradis  CBJ:[id number]  DOB: 03-Jun-1971   DOA: 01/08/2022  Referring Physician: Satira Sark, MD  HPI: Cache Bills is a 51 y.o. male being followed for ventilator/airway/oxygen weaning Acute on Chronic Respiratory Failure.  Patient seen lying in bed, currently remains on full support ventilation.  RT continues to work with family for trach care education.  Medications: Reviewed on Rounds  Physical Exam:  Vitals: Temp 97.9, pulse 67, respirations 23, BP 121/69, SPO2 100%  Ventilator Settings AC VC, FiO2 28%, tidal volume 500, rate 16, PEEP 5  General: Comfortable at this time Neck: supple Cardiovascular: no malignant arrhythmias Respiratory: Bilaterally diminished Skin: no rash seen on limited exam Musculoskeletal: No gross abnormality Psychiatric:unable to assess Neurologic:no involuntary movements         Lab Data:   Basic Metabolic Panel: Recent Labs  Lab 03/26/22 0424 03/29/22 0630 03/30/22 0631 03/31/22 0252  NA 139 137  --  139  K 3.6 3.5  --  3.7  CL 110 109  --  109  CO2 23 24  --  20*  GLUCOSE 110* 124*  --  115*  BUN 25* 21*  --  18  CREATININE <0.30* <0.30*  --  <0.30*  CALCIUM 9.2 9.1  --  9.1  MG 1.9 1.7 1.8 1.8  PHOS 3.5  3.5 3.6  --   --     ABG: No results for input(s): "PHART", "PCO2ART", "PO2ART", "HCO3", "O2SAT" in the last 168 hours.  Liver Function Tests: Recent Labs  Lab 03/26/22 0424  ALBUMIN 3.0*   No results for input(s): "LIPASE", "AMYLASE" in the last 168 hours. No results for input(s): "AMMONIA" in the last 168 hours.  CBC: Recent Labs  Lab 03/26/22 0424 03/29/22 0630  WBC 6.3 5.3  HGB 8.0* 8.4*  HCT 25.4* 27.4*  MCV 87.3 89.0  PLT 150 173    Cardiac Enzymes: No results for input(s): "CKTOTAL", "CKMB", "CKMBINDEX", "TROPONINI" in the last 168 hours.  BNP (last 3 results) No results  for input(s): "BNP" in the last 8760 hours.  ProBNP (last 3 results) No results for input(s): "PROBNP" in the last 8760 hours.  Radiological Exams: DG Abd Portable 1V  Result Date: 03/31/2022 CLINICAL DATA:  51 year old male with abnormal bowel gas pattern. EXAM: PORTABLE ABDOMEN - 1 VIEW COMPARISON:  Recent repeat CT Abdomen and Pelvis 03/28/2022, and earlier. FINDINGS: Portable AP supine view at 0639 hours. Stable percutaneous enteric tube. Pelvic catheter in place now seems more likely to be a rectal tube than associated with the urinary bladder. Since 03/21/2022 diffuse gas-filled but generally nondistended large and small bowel loops throughout the abdomen and pelvis have not significantly changed. No pneumoperitoneum identified on this supine view. Pelvic phleboliths. Stable visualized osseous structures. IMPRESSION: Bowel-gas pattern compatible with ileus not significantly changed since 03/21/2010. Electronically Signed   By: Genevie Ann M.D.   On: 03/31/2022 06:58    Assessment/Plan Active Problems:   ALS (amyotrophic lateral sclerosis) (HCC)   Septic shock (HCC)   Pseudomonas pneumonia (HCC)   Autonomic dysfunction   Acute on chronic respiratory failure with hypoxia (HCC)  Acute on chronic respiratory failure hypoxia-remains on baseline of full support.  Continue with pulmonary toilet. Status post tracheostomy-remains in place and is stable.  Continue trach care per protocol.  No plans for decannulation. ALS severe advanced disease-continue with supportive care. Pseudomonas- resolved.  Continue  with supportive care. Autonomic dysfunction- at baseline right now.   I have personally seen and evaluated the patient, evaluated laboratory and imaging results, formulated the assessment and plan and placed orders. The Patient requires high complexity decision making with multiple systems involvement.  Rounds were done with the Respiratory Therapy Director and Staff therapists and discussed  with nursing staff also.  Allyne Gee, MD [facility] Pulmonary Critical Care Medicine Sleep Medicine

## 2022-03-31 NOTE — Progress Notes (Addendum)
Pulmonary Critical Care Medicine [place]   PULMONARY CRITICAL CARE SERVICE  PROGRESS NOTE     Jerry [name]  [id number]  DOB: 1971-03-14   DOA: 01/08/2022  Referring Physician: Satira Sark, MD  HPI: Jerry [name] is a 51 y.o. male being followed for ventilator/airway/oxygen weaning Acute on Chronic Respiratory Failure.  Patient seen lying in bed, family at bedside.  Currently remains on baseline full support ventilation.  Medications: Reviewed on Rounds  Physical Exam:  Vitals: Temp 97.1, pulse 63, respirations 18, BP 104/61, SPO2 100%  Ventilator Settings AC PC, FiO2 28%, tidal volume 500, rate 16, PEEP 5  General: Comfortable at this time Neck: supple Cardiovascular: no malignant arrhythmias Respiratory: Bilaterally clear but diminished Skin: no rash seen on limited exam Musculoskeletal: No gross abnormality Psychiatric:unable to assess Neurologic:no involuntary movements         Lab Data:   Basic Metabolic Panel: Recent Labs  Lab 03/26/22 0424 03/29/22 0630 03/30/22 0631 03/31/22 0252  NA 139 137  --  139  K 3.6 3.5  --  3.7  CL 110 109  --  109  CO2 23 24  --  20*  GLUCOSE 110* 124*  --  115*  BUN 25* 21*  --  18  CREATININE <0.30* <0.30*  --  <0.30*  CALCIUM 9.2 9.1  --  9.1  MG 1.9 1.7 1.8 1.8  PHOS 3.5  3.5 3.6  --   --     ABG: No results for input(s): "PHART", "PCO2ART", "PO2ART", "HCO3", "O2SAT" in the last 168 hours.  Liver Function Tests: Recent Labs  Lab 03/26/22 0424  ALBUMIN 3.0*   No results for input(s): "LIPASE", "AMYLASE" in the last 168 hours. No results for input(s): "AMMONIA" in the last 168 hours.  CBC: Recent Labs  Lab 03/26/22 0424 03/29/22 0630  WBC 6.3 5.3  HGB 8.0* 8.4*  HCT 25.4* 27.4*  MCV 87.3 89.0  PLT 150 173    Cardiac Enzymes: No results for input(s): "CKTOTAL", "CKMB", "CKMBINDEX", "TROPONINI" in the last 168 hours.  BNP (last 3 results) No results for input(s): "BNP"  in the last 8760 hours.  ProBNP (last 3 results) No results for input(s): "PROBNP" in the last 8760 hours.  Radiological Exams: DG Abd Portable 1V  Result Date: 03/31/2022 CLINICAL DATA:  51 year old male with abnormal bowel gas pattern. EXAM: PORTABLE ABDOMEN - 1 VIEW COMPARISON:  Recent repeat CT Abdomen and Pelvis 03/28/2022, and earlier. FINDINGS: Portable AP supine view at 0639 hours. Stable percutaneous enteric tube. Pelvic catheter in place now seems more likely to be a rectal tube than associated with the urinary bladder. Since 03/21/2022 diffuse gas-filled but generally nondistended large and small bowel loops throughout the abdomen and pelvis have not significantly changed. No pneumoperitoneum identified on this supine view. Pelvic phleboliths. Stable visualized osseous structures. IMPRESSION: Bowel-gas pattern compatible with ileus not significantly changed since 03/21/2010. Electronically Signed   By: Genevie Ann M.D.   On: 03/31/2022 06:58    Assessment/Plan Active Problems:   ALS (amyotrophic lateral sclerosis) (HCC)   Septic shock (HCC)   Pseudomonas pneumonia (HCC)   Autonomic dysfunction   Acute on chronic respiratory failure with hypoxia (HCC)   Acute on chronic respiratory failure hypoxia-remains on baseline of full support.  Continue with pulmonary toilet. Status post tracheostomy-remains in place and is stable.  Continue trach care per protocol.  No plans for decannulation. ALS severe advanced disease-continue with supportive care. Pseudomonas- resolved.  Continue with supportive care.  Autonomic dysfunction- at baseline right now.   I have personally seen and evaluated the patient, evaluated laboratory and imaging results, formulated the assessment and plan and placed orders. The Patient requires high complexity decision making with multiple systems involvement.  Rounds were done with the Respiratory Therapy Director and Staff therapists and discussed with nursing staff  also.  Allyne Gee, MD [facility] Pulmonary Critical Care Medicine Sleep Medicine

## 2022-04-01 ENCOUNTER — Ambulatory Visit: Payer: Self-pay

## 2022-04-01 DIAGNOSIS — G909 Disorder of the autonomic nervous system, unspecified: Secondary | ICD-10-CM | POA: Diagnosis not present

## 2022-04-01 DIAGNOSIS — J151 Pneumonia due to Pseudomonas: Secondary | ICD-10-CM | POA: Diagnosis not present

## 2022-04-01 DIAGNOSIS — J9621 Acute and chronic respiratory failure with hypoxia: Secondary | ICD-10-CM | POA: Diagnosis not present

## 2022-04-01 DIAGNOSIS — R6521 Severe sepsis with septic shock: Secondary | ICD-10-CM | POA: Diagnosis not present

## 2022-04-01 DIAGNOSIS — G1221 Amyotrophic lateral sclerosis: Secondary | ICD-10-CM | POA: Diagnosis not present

## 2022-04-01 DIAGNOSIS — A419 Sepsis, unspecified organism: Secondary | ICD-10-CM | POA: Diagnosis not present

## 2022-04-01 LAB — PHOSPHORUS: Phosphorus: 3 mg/dL (ref 2.5–4.6)

## 2022-04-01 LAB — MAGNESIUM: Magnesium: 1.8 mg/dL (ref 1.7–2.4)

## 2022-04-01 NOTE — Progress Notes (Signed)
Pulmonary Critical Care Medicine [place]   PULMONARY CRITICAL CARE SERVICE  PROGRESS NOTE     Jerry [name]  [id number]  DOB: 15-Sep-1970   DOA: 01/08/2022  Referring Physician: Satira Sark, MD  HPI: Jerry [name] is a 51 y.o. male being followed for ventilator/airway/oxygen weaning Acute on Chronic Respiratory Failure.  Patient currently is on assist control mode has been on 28% FiO2 with good saturations  Medications: Reviewed on Rounds  Physical Exam:  Vitals: Temperature is 97.9 pulse 71 respiratory rate is 18 blood pressure 100/57 saturations 100%  Ventilator Settings on assist control FiO2 is 28% tidal volume 500 PEEP 5  General: Comfortable at this time Neck: supple Cardiovascular: no malignant arrhythmias Respiratory: No rhonchi or rales are noted Skin: no rash seen on limited exam Musculoskeletal: No gross abnormality Psychiatric:unable to assess Neurologic:no involuntary movements         Lab Data:   Basic Metabolic Panel: Recent Labs  Lab 03/26/22 0424 03/29/22 0630 03/30/22 0631 03/31/22 0252 04/01/22 0155  NA 139 137  --  139  --   K 3.6 3.5  --  3.7  --   CL 110 109  --  109  --   CO2 23 24  --  20*  --   GLUCOSE 110* 124*  --  115*  --   BUN 25* 21*  --  18  --   CREATININE <0.30* <0.30*  --  <0.30*  --   CALCIUM 9.2 9.1  --  9.1  --   MG 1.9 1.7 1.8 1.8 1.8  PHOS 3.5  3.5 3.6  --   --  3.0    ABG: No results for input(s): "PHART", "PCO2ART", "PO2ART", "HCO3", "O2SAT" in the last 168 hours.  Liver Function Tests: Recent Labs  Lab 03/26/22 0424  ALBUMIN 3.0*   No results for input(s): "LIPASE", "AMYLASE" in the last 168 hours. No results for input(s): "AMMONIA" in the last 168 hours.  CBC: Recent Labs  Lab 03/26/22 0424 03/29/22 0630  WBC 6.3 5.3  HGB 8.0* 8.4*  HCT 25.4* 27.4*  MCV 87.3 89.0  PLT 150 173    Cardiac Enzymes: No results for input(s): "CKTOTAL", "CKMB", "CKMBINDEX", "TROPONINI"  in the last 168 hours.  BNP (last 3 results) No results for input(s): "BNP" in the last 8760 hours.  ProBNP (last 3 results) No results for input(s): "PROBNP" in the last 8760 hours.  Radiological Exams: DG Abd Portable 1V  Result Date: 03/31/2022 CLINICAL DATA:  51 year old male with abnormal bowel gas pattern. EXAM: PORTABLE ABDOMEN - 1 VIEW COMPARISON:  Recent repeat CT Abdomen and Pelvis 03/28/2022, and earlier. FINDINGS: Portable AP supine view at 0639 hours. Stable percutaneous enteric tube. Pelvic catheter in place now seems more likely to be a rectal tube than associated with the urinary bladder. Since 03/21/2022 diffuse gas-filled but generally nondistended large and small bowel loops throughout the abdomen and pelvis have not significantly changed. No pneumoperitoneum identified on this supine view. Pelvic phleboliths. Stable visualized osseous structures. IMPRESSION: Bowel-gas pattern compatible with ileus not significantly changed since 03/21/2010. Electronically Signed   By: Genevie Ann M.D.   On: 03/31/2022 06:58    Assessment/Plan Active Problems:   ALS (amyotrophic lateral sclerosis) (HCC)   Septic shock (HCC)   Pseudomonas pneumonia (HCC)   Autonomic dysfunction   Acute on chronic respiratory failure with hypoxia (HCC)   Acute on chronic respiratory failure hypoxia plan is to continue with full support on training await  discharge planning ALS severe advanced disease Sepsis resolved hemodynamics are stable Pseudomonas pneumonia has been treated Autonomic dysfunction patient is at baseline   I have personally seen and evaluated the patient, evaluated laboratory and imaging results, formulated the assessment and plan and placed orders. The Patient requires high complexity decision making with multiple systems involvement.  Rounds were done with the Respiratory Therapy Director and Staff therapists and discussed with nursing staff also.  Allyne Gee, MD [facility] Pulmonary  Critical Care Medicine Sleep Medicine

## 2022-04-02 DIAGNOSIS — G909 Disorder of the autonomic nervous system, unspecified: Secondary | ICD-10-CM | POA: Diagnosis not present

## 2022-04-02 DIAGNOSIS — A419 Sepsis, unspecified organism: Secondary | ICD-10-CM | POA: Diagnosis not present

## 2022-04-02 DIAGNOSIS — R6521 Severe sepsis with septic shock: Secondary | ICD-10-CM | POA: Diagnosis not present

## 2022-04-02 DIAGNOSIS — G1221 Amyotrophic lateral sclerosis: Secondary | ICD-10-CM | POA: Diagnosis not present

## 2022-04-02 DIAGNOSIS — J9621 Acute and chronic respiratory failure with hypoxia: Secondary | ICD-10-CM | POA: Diagnosis not present

## 2022-04-02 DIAGNOSIS — J151 Pneumonia due to Pseudomonas: Secondary | ICD-10-CM | POA: Diagnosis not present

## 2022-04-02 LAB — CULTURE, RESPIRATORY W GRAM STAIN: Special Requests: NORMAL

## 2022-04-02 NOTE — Progress Notes (Signed)
Pulmonary Critical Care Medicine [place]   PULMONARY CRITICAL CARE SERVICE  PROGRESS NOTE     Cashawn Yanko  BDZ:[id number]  DOB: 1971-01-09   DOA: 01/08/2022  Referring Physician: Satira Sark, MD  HPI: Gualberto Wahlen is a 51 y.o. male being followed for ventilator/airway/oxygen weaning Acute on Chronic Respiratory Failure.  Patient remains on the ventilator on assist control mode currently on 28% FiO2 family training is still being attempted  Medications: Reviewed on Rounds  Physical Exam:  Vitals: Temperature 97.8 pulse 61 respiratory rate is 18 blood pressure 105/61 saturations 100%  Ventilator Settings assist-control FiO2 28% tidal volume 500 PEEP of 5  General: Comfortable at this time Neck: supple Cardiovascular: no malignant arrhythmias Respiratory: No rhonchi no rales are noted at this time Skin: no rash seen on limited exam Musculoskeletal: No gross abnormality Psychiatric:unable to assess Neurologic:no involuntary movements         Lab Data:   Basic Metabolic Panel: Recent Labs  Lab 03/29/22 0630 03/30/22 0631 03/31/22 0252 04/01/22 0155  NA 137  --  139  --   K 3.5  --  3.7  --   CL 109  --  109  --   CO2 24  --  20*  --   GLUCOSE 124*  --  115*  --   BUN 21*  --  18  --   CREATININE <0.30*  --  <0.30*  --   CALCIUM 9.1  --  9.1  --   MG 1.7 1.8 1.8 1.8  PHOS 3.6  --   --  3.0    ABG: No results for input(s): "PHART", "PCO2ART", "PO2ART", "HCO3", "O2SAT" in the last 168 hours.  Liver Function Tests: No results for input(s): "AST", "ALT", "ALKPHOS", "BILITOT", "PROT", "ALBUMIN" in the last 168 hours. No results for input(s): "LIPASE", "AMYLASE" in the last 168 hours. No results for input(s): "AMMONIA" in the last 168 hours.  CBC: Recent Labs  Lab 03/29/22 0630  WBC 5.3  HGB 8.4*  HCT 27.4*  MCV 89.0  PLT 173    Cardiac Enzymes: No results for input(s): "CKTOTAL", "CKMB", "CKMBINDEX", "TROPONINI" in the last  168 hours.  BNP (last 3 results) No results for input(s): "BNP" in the last 8760 hours.  ProBNP (last 3 results) No results for input(s): "PROBNP" in the last 8760 hours.  Radiological Exams: No results found.  Assessment/Plan Active Problems:   ALS (amyotrophic lateral sclerosis) (HCC)   Septic shock (HCC)   Pseudomonas pneumonia (HCC)   Autonomic dysfunction   Acute on chronic respiratory failure with hypoxia (HCC)   Acute on chronic respiratory failure hypoxia we will continue with the ventilator full support Septic shock treated in resolution we will continue to follow along closely Pseudomonas pneumonia has been treated we will continue to follow along closely Autonomic dysfunction no change ALS supportive care we will continue to follow   I have personally seen and evaluated the patient, evaluated laboratory and imaging results, formulated the assessment and plan and placed orders. The Patient requires high complexity decision making with multiple systems involvement.  Rounds were done with the Respiratory Therapy Director and Staff therapists and discussed with nursing staff also.  Allyne Gee, MD [facility] Pulmonary Critical Care Medicine Sleep Medicine

## 2022-04-03 ENCOUNTER — Other Ambulatory Visit (HOSPITAL_COMMUNITY): Payer: Self-pay

## 2022-04-03 DIAGNOSIS — R6521 Severe sepsis with septic shock: Secondary | ICD-10-CM | POA: Diagnosis not present

## 2022-04-03 DIAGNOSIS — J9621 Acute and chronic respiratory failure with hypoxia: Secondary | ICD-10-CM | POA: Diagnosis not present

## 2022-04-03 DIAGNOSIS — A419 Sepsis, unspecified organism: Secondary | ICD-10-CM | POA: Diagnosis not present

## 2022-04-03 DIAGNOSIS — G909 Disorder of the autonomic nervous system, unspecified: Secondary | ICD-10-CM | POA: Diagnosis not present

## 2022-04-03 DIAGNOSIS — K5939 Other megacolon: Secondary | ICD-10-CM | POA: Diagnosis not present

## 2022-04-03 DIAGNOSIS — G1221 Amyotrophic lateral sclerosis: Secondary | ICD-10-CM | POA: Diagnosis not present

## 2022-04-03 DIAGNOSIS — K6389 Other specified diseases of intestine: Secondary | ICD-10-CM | POA: Diagnosis not present

## 2022-04-03 DIAGNOSIS — J151 Pneumonia due to Pseudomonas: Secondary | ICD-10-CM | POA: Diagnosis not present

## 2022-04-03 LAB — CBC
HCT: 26.1 % — ABNORMAL LOW (ref 39.0–52.0)
Hemoglobin: 8.2 g/dL — ABNORMAL LOW (ref 13.0–17.0)
MCH: 27.5 pg (ref 26.0–34.0)
MCHC: 31.4 g/dL (ref 30.0–36.0)
MCV: 87.6 fL (ref 80.0–100.0)
Platelets: 159 10*3/uL (ref 150–400)
RBC: 2.98 MIL/uL — ABNORMAL LOW (ref 4.22–5.81)
RDW: 16.4 % — ABNORMAL HIGH (ref 11.5–15.5)
WBC: 5.6 10*3/uL (ref 4.0–10.5)
nRBC: 0 % (ref 0.0–0.2)

## 2022-04-03 LAB — BASIC METABOLIC PANEL
Anion gap: 10 (ref 5–15)
BUN: 20 mg/dL (ref 6–20)
CO2: 21 mmol/L — ABNORMAL LOW (ref 22–32)
Calcium: 9.1 mg/dL (ref 8.9–10.3)
Chloride: 107 mmol/L (ref 98–111)
Creatinine, Ser: <0.3 mg/dL — ABNORMAL LOW (ref 0.61–1.24)
Glucose, Bld: 120 mg/dL — ABNORMAL HIGH (ref 70–99)
Potassium: 3.6 mmol/L (ref 3.5–5.1)
Sodium: 138 mmol/L (ref 135–145)

## 2022-04-03 LAB — MAGNESIUM: Magnesium: 1.9 mg/dL (ref 1.7–2.4)

## 2022-04-03 NOTE — Progress Notes (Signed)
Pulmonary Critical Care Medicine [place]   PULMONARY CRITICAL CARE SERVICE  PROGRESS NOTE     Scout Gumbs  [id number]:[id number]  DOB: Nov 07, 1970   DOA: 01/08/2022  Referring Physician: Satira Sark, MD  HPI: Jaxsen Bernhart is a 51 y.o. male being followed for ventilator/airway/oxygen weaning Acute on Chronic Respiratory Failure.  Patient currently is on assist control mode has been on 28% FiO2 saturations are good  Medications: Reviewed on Rounds  Physical Exam:  Vitals: Temperature 97.0 pulse 62 respiratory rate 16 blood pressure is 114/78 saturations 98%  Ventilator Settings assist-control FiO2 28% tidal volume 500 PEEP of 5  General: Comfortable at this time Neck: supple Cardiovascular: no malignant arrhythmias Respiratory: Scattered coarse rhonchi Skin: no rash seen on limited exam Musculoskeletal: No gross abnormality Psychiatric:unable to assess Neurologic:no involuntary movements         Lab Data:   Basic Metabolic Panel: Recent Labs  Lab 03/29/22 0630 03/30/22 0631 03/31/22 0252 04/01/22 0155 04/03/22 0217  NA 137  --  139  --  138  K 3.5  --  3.7  --  3.6  CL 109  --  109  --  107  CO2 24  --  20*  --  21*  GLUCOSE 124*  --  115*  --  120*  BUN 21*  --  18  --  20  CREATININE <0.30*  --  <0.30*  --  <0.30*  CALCIUM 9.1  --  9.1  --  9.1  MG 1.7 1.8 1.8 1.8 1.9  PHOS 3.6  --   --  3.0  --     ABG: No results for input(s): "PHART", "PCO2ART", "PO2ART", "HCO3", "O2SAT" in the last 168 hours.  Liver Function Tests: No results for input(s): "AST", "ALT", "ALKPHOS", "BILITOT", "PROT", "ALBUMIN" in the last 168 hours. No results for input(s): "LIPASE", "AMYLASE" in the last 168 hours. No results for input(s): "AMMONIA" in the last 168 hours.  CBC: Recent Labs  Lab 03/29/22 0630 04/03/22 0217  WBC 5.3 5.6  HGB 8.4* 8.2*  HCT 27.4* 26.1*  MCV 89.0 87.6  PLT 173 159    Cardiac Enzymes: No results for input(s): "CKTOTAL",  "CKMB", "CKMBINDEX", "TROPONINI" in the last 168 hours.  BNP (last 3 results) No results for input(s): "BNP" in the last 8760 hours.  ProBNP (last 3 results) No results for input(s): "PROBNP" in the last 8760 hours.  Radiological Exams: DG Abd Portable 1V  Result Date: 04/03/2022 CLINICAL DATA:  Abdominal distention EXAM: PORTABLE ABDOMEN - 1 VIEW COMPARISON:  Previous studies including the examination of 03/31/2022 FINDINGS: There is mild dilation of small-bowel loops with gas. There is interval decrease in amount of small bowel gas. Gas is present in colon and rectum. Gastrostomy tube is noted. Rectal tube is noted. IMPRESSION: There is interval decrease in amount of small bowel gas. There is residual mild dilation of small-bowel loops suggesting ileus. Electronically Signed   By: Elmer Picker M.D.   On: 04/03/2022 10:33    Assessment/Plan Active Problems:   ALS (amyotrophic lateral sclerosis) (HCC)   Septic shock (HCC)   Pseudomonas pneumonia (HCC)   Autonomic dysfunction   Acute on chronic respiratory failure with hypoxia (HCC)   Acute on chronic respiratory failure hypoxia patient is at baseline on the ventilator waiting for home training to be completed ALS supportive care we will continue to follow along Sepsis and shock resolved Autonomic dysfunction at baseline Pseudomonas pneumonia has been treated and clinically improved  I have personally seen and evaluated the patient, evaluated laboratory and imaging results, formulated the assessment and plan and placed orders. The Patient requires high complexity decision making with multiple systems involvement.  Rounds were done with the Respiratory Therapy Director and Staff therapists and discussed with nursing staff also.  Allyne Gee, MD [facility] Pulmonary Critical Care Medicine Sleep Medicine

## 2022-04-04 DIAGNOSIS — A419 Sepsis, unspecified organism: Secondary | ICD-10-CM | POA: Diagnosis not present

## 2022-04-04 DIAGNOSIS — J151 Pneumonia due to Pseudomonas: Secondary | ICD-10-CM | POA: Diagnosis not present

## 2022-04-04 DIAGNOSIS — J9621 Acute and chronic respiratory failure with hypoxia: Secondary | ICD-10-CM | POA: Diagnosis not present

## 2022-04-04 DIAGNOSIS — G909 Disorder of the autonomic nervous system, unspecified: Secondary | ICD-10-CM | POA: Diagnosis not present

## 2022-04-04 DIAGNOSIS — G1221 Amyotrophic lateral sclerosis: Secondary | ICD-10-CM | POA: Diagnosis not present

## 2022-04-04 DIAGNOSIS — R6521 Severe sepsis with septic shock: Secondary | ICD-10-CM | POA: Diagnosis not present

## 2022-04-04 LAB — MAGNESIUM: Magnesium: 1.9 mg/dL (ref 1.7–2.4)

## 2022-04-04 LAB — PHOSPHORUS: Phosphorus: 2.9 mg/dL (ref 2.5–4.6)

## 2022-04-04 NOTE — Progress Notes (Signed)
Pulmonary Critical Care Medicine [place]   PULMONARY CRITICAL CARE SERVICE  PROGRESS NOTE     Jerry [name]  [id number]:[id number]  DOB: January 18, 1971   DOA: 01/08/2022  Referring Physician: Satira Sark, MD  HPI: Jerry [name] is a 51 y.o. male being followed for ventilator/airway/oxygen weaning Acute on Chronic Respiratory Failure.  Patient is currently on assist control mode good saturations are noted  Medications: Reviewed on Rounds  Physical Exam:  Vitals: Temperature is 98.8 pulse 78 respiratory 18 blood pressure is 125/70 saturations 100%  Ventilator Settings on assist control FiO2 is 28% tidal volume 500 PEEP of 5  General: Comfortable at this time Neck: supple Cardiovascular: no malignant arrhythmias Respiratory: No rhonchi no rales are noted Skin: no rash seen on limited exam Musculoskeletal: No gross abnormality Psychiatric:unable to assess Neurologic:no involuntary movements         Lab Data:   Basic Metabolic Panel: Recent Labs  Lab 03/29/22 0630 03/30/22 0631 03/31/22 0252 04/01/22 0155 04/03/22 0217 04/04/22 0217  NA 137  --  139  --  138  --   K 3.5  --  3.7  --  3.6  --   CL 109  --  109  --  107  --   CO2 24  --  20*  --  21*  --   GLUCOSE 124*  --  115*  --  120*  --   BUN 21*  --  18  --  20  --   CREATININE <0.30*  --  <0.30*  --  <0.30*  --   CALCIUM 9.1  --  9.1  --  9.1  --   MG 1.7 1.8 1.8 1.8 1.9 1.9  PHOS 3.6  --   --  3.0  --  2.9    ABG: No results for input(s): "PHART", "PCO2ART", "PO2ART", "HCO3", "O2SAT" in the last 168 hours.  Liver Function Tests: No results for input(s): "AST", "ALT", "ALKPHOS", "BILITOT", "PROT", "ALBUMIN" in the last 168 hours. No results for input(s): "LIPASE", "AMYLASE" in the last 168 hours. No results for input(s): "AMMONIA" in the last 168 hours.  CBC: Recent Labs  Lab 03/29/22 0630 04/03/22 0217  WBC 5.3 5.6  HGB 8.4* 8.2*  HCT 27.4* 26.1*  MCV 89.0 87.6  PLT 173 159     Cardiac Enzymes: No results for input(s): "CKTOTAL", "CKMB", "CKMBINDEX", "TROPONINI" in the last 168 hours.  BNP (last 3 results) No results for input(s): "BNP" in the last 8760 hours.  ProBNP (last 3 results) No results for input(s): "PROBNP" in the last 8760 hours.  Radiological Exams: DG Abd Portable 1V  Result Date: 04/03/2022 CLINICAL DATA:  Abdominal distention EXAM: PORTABLE ABDOMEN - 1 VIEW COMPARISON:  Previous studies including the examination of 03/31/2022 FINDINGS: There is mild dilation of small-bowel loops with gas. There is interval decrease in amount of small bowel gas. Gas is present in colon and rectum. Gastrostomy tube is noted. Rectal tube is noted. IMPRESSION: There is interval decrease in amount of small bowel gas. There is residual mild dilation of small-bowel loops suggesting ileus. Electronically Signed   By: Elmer Picker M.D.   On: 04/03/2022 10:33    Assessment/Plan Active Problems:   ALS (amyotrophic lateral sclerosis) (HCC)   Septic shock (HCC)   Pseudomonas pneumonia (HCC)   Autonomic dysfunction   Acute on chronic respiratory failure with hypoxia (HCC)   Acute on chronic respiratory failure hypoxia patient is at baseline on mechanical ventilation full support  ALS advanced severe disease Sepsis with shock treated resolved Pseudomonas pneumonia has been treated Autonomic dysfunction no change we will continue to follow along closely   I have personally seen and evaluated the patient, evaluated laboratory and imaging results, formulated the assessment and plan and placed orders. The Patient requires high complexity decision making with multiple systems involvement.  Rounds were done with the Respiratory Therapy Director and Staff therapists and discussed with nursing staff also.  Allyne Gee, MD [facility] Pulmonary Critical Care Medicine Sleep Medicine

## 2022-04-05 ENCOUNTER — Other Ambulatory Visit (HOSPITAL_COMMUNITY): Payer: Self-pay

## 2022-04-05 DIAGNOSIS — K567 Ileus, unspecified: Secondary | ICD-10-CM | POA: Diagnosis not present

## 2022-04-05 DIAGNOSIS — J151 Pneumonia due to Pseudomonas: Secondary | ICD-10-CM | POA: Diagnosis not present

## 2022-04-05 DIAGNOSIS — G909 Disorder of the autonomic nervous system, unspecified: Secondary | ICD-10-CM | POA: Diagnosis not present

## 2022-04-05 DIAGNOSIS — J9621 Acute and chronic respiratory failure with hypoxia: Secondary | ICD-10-CM | POA: Diagnosis not present

## 2022-04-05 DIAGNOSIS — G1221 Amyotrophic lateral sclerosis: Secondary | ICD-10-CM | POA: Diagnosis not present

## 2022-04-05 DIAGNOSIS — R6521 Severe sepsis with septic shock: Secondary | ICD-10-CM | POA: Diagnosis not present

## 2022-04-05 DIAGNOSIS — A419 Sepsis, unspecified organism: Secondary | ICD-10-CM | POA: Diagnosis not present

## 2022-04-05 NOTE — Progress Notes (Signed)
Pulmonary Critical Care Medicine [place]   PULMONARY CRITICAL CARE SERVICE  PROGRESS NOTE     Jerry [name]  [id number]:[id number]  DOB: 1970-11-24   DOA: 01/08/2022  Referring Physician: Satira Sark, MD  HPI: Jerry [name] is a 51 y.o. male being followed for ventilator/airway/oxygen weaning Acute on Chronic Respiratory Failure.  Patient is on the ventilator stable at this time full support  Medications: Reviewed on Rounds  Physical Exam:  Vitals: Temperature is 96.8 pulse 50 respiratory 24 blood pressure 117/68 saturations 100%  Ventilator Settings assist-control FiO2 28% tidal volume 500 PEEP 5  General: Comfortable at this time Neck: supple Cardiovascular: no malignant arrhythmias Respiratory: No rhonchi no rales Skin: no rash seen on limited exam Musculoskeletal: No gross abnormality Psychiatric:unable to assess Neurologic:no involuntary movements         Lab Data:   Basic Metabolic Panel: Recent Labs  Lab 03/30/22 0631 03/31/22 0252 04/01/22 0155 04/03/22 0217 04/04/22 0217  NA  --  139  --  138  --   K  --  3.7  --  3.6  --   CL  --  109  --  107  --   CO2  --  20*  --  21*  --   GLUCOSE  --  115*  --  120*  --   BUN  --  18  --  20  --   CREATININE  --  <0.30*  --  <0.30*  --   CALCIUM  --  9.1  --  9.1  --   MG 1.8 1.8 1.8 1.9 1.9  PHOS  --   --  3.0  --  2.9    ABG: No results for input(s): "PHART", "PCO2ART", "PO2ART", "HCO3", "O2SAT" in the last 168 hours.  Liver Function Tests: No results for input(s): "AST", "ALT", "ALKPHOS", "BILITOT", "PROT", "ALBUMIN" in the last 168 hours. No results for input(s): "LIPASE", "AMYLASE" in the last 168 hours. No results for input(s): "AMMONIA" in the last 168 hours.  CBC: Recent Labs  Lab 04/03/22 0217  WBC 5.6  HGB 8.2*  HCT 26.1*  MCV 87.6  PLT 159    Cardiac Enzymes: No results for input(s): "CKTOTAL", "CKMB", "CKMBINDEX", "TROPONINI" in the last 168 hours.  BNP (last 3  results) No results for input(s): "BNP" in the last 8760 hours.  ProBNP (last 3 results) No results for input(s): "PROBNP" in the last 8760 hours.  Radiological Exams: DG Abd Portable 1V  Result Date: 04/03/2022 CLINICAL DATA:  Abdominal distention EXAM: PORTABLE ABDOMEN - 1 VIEW COMPARISON:  Previous studies including the examination of 03/31/2022 FINDINGS: There is mild dilation of small-bowel loops with gas. There is interval decrease in amount of small bowel gas. Gas is present in colon and rectum. Gastrostomy tube is noted. Rectal tube is noted. IMPRESSION: There is interval decrease in amount of small bowel gas. There is residual mild dilation of small-bowel loops suggesting ileus. Electronically Signed   By: Elmer Picker M.D.   On: 04/03/2022 10:33    Assessment/Plan Active Problems:   ALS (amyotrophic lateral sclerosis) (HCC)   Septic shock (HCC)   Pseudomonas pneumonia (HCC)   Autonomic dysfunction   Acute on chronic respiratory failure with hypoxia (HCC)   Acute on chronic respiratory failure hypoxia patient is on the ventilator full support not weanable we will continue to monitor closely. ALS severe end-stage disease Sepsis with shock resolved hemodynamics are stable Pseudomonas pneumonia has been treated with antibiotics supportive care  Autonomic dysfunction supportive care   I have personally seen and evaluated the patient, evaluated laboratory and imaging results, formulated the assessment and plan and placed orders. The Patient requires high complexity decision making with multiple systems involvement.  Rounds were done with the Respiratory Therapy Director and Staff therapists and discussed with nursing staff also.  Allyne Gee, MD [facility] Pulmonary Critical Care Medicine Sleep Medicine

## 2022-04-06 DIAGNOSIS — G909 Disorder of the autonomic nervous system, unspecified: Secondary | ICD-10-CM | POA: Diagnosis not present

## 2022-04-06 DIAGNOSIS — G1221 Amyotrophic lateral sclerosis: Secondary | ICD-10-CM | POA: Diagnosis not present

## 2022-04-06 DIAGNOSIS — A419 Sepsis, unspecified organism: Secondary | ICD-10-CM | POA: Diagnosis not present

## 2022-04-06 DIAGNOSIS — J9621 Acute and chronic respiratory failure with hypoxia: Secondary | ICD-10-CM | POA: Diagnosis not present

## 2022-04-06 DIAGNOSIS — R6521 Severe sepsis with septic shock: Secondary | ICD-10-CM | POA: Diagnosis not present

## 2022-04-06 DIAGNOSIS — J151 Pneumonia due to Pseudomonas: Secondary | ICD-10-CM | POA: Diagnosis not present

## 2022-04-06 LAB — CBC
HCT: 26.5 % — ABNORMAL LOW (ref 39.0–52.0)
Hemoglobin: 8.4 g/dL — ABNORMAL LOW (ref 13.0–17.0)
MCH: 27.3 pg (ref 26.0–34.0)
MCHC: 31.7 g/dL (ref 30.0–36.0)
MCV: 86 fL (ref 80.0–100.0)
Platelets: 164 10*3/uL (ref 150–400)
RBC: 3.08 MIL/uL — ABNORMAL LOW (ref 4.22–5.81)
RDW: 16.4 % — ABNORMAL HIGH (ref 11.5–15.5)
WBC: 5.4 10*3/uL (ref 4.0–10.5)
nRBC: 0 % (ref 0.0–0.2)

## 2022-04-06 LAB — BASIC METABOLIC PANEL
Anion gap: 8 (ref 5–15)
BUN: 19 mg/dL (ref 6–20)
CO2: 23 mmol/L (ref 22–32)
Calcium: 9.4 mg/dL (ref 8.9–10.3)
Chloride: 107 mmol/L (ref 98–111)
Creatinine, Ser: <0.3 mg/dL — ABNORMAL LOW (ref 0.61–1.24)
Glucose, Bld: 131 mg/dL — ABNORMAL HIGH (ref 70–99)
Potassium: 3.7 mmol/L (ref 3.5–5.1)
Sodium: 138 mmol/L (ref 135–145)

## 2022-04-06 LAB — MAGNESIUM: Magnesium: 1.8 mg/dL (ref 1.7–2.4)

## 2022-04-06 NOTE — Progress Notes (Signed)
Pulmonary Critical Care Medicine [place]   PULMONARY CRITICAL CARE SERVICE  PROGRESS NOTE     Merl Bommarito  QQP:[id number]  DOB: 05-28-1971   DOA: 01/08/2022  Referring Physician: Satira Sark, MD  HPI: Kire Ferg is a 51 y.o. male being followed for ventilator/airway/oxygen weaning Acute on Chronic Respiratory Failure.  Patient currently is on assist control mode has been on 28% FiO2 good volumes are noted  Medications: Reviewed on Rounds  Physical Exam:  Vitals: Temperature is 99.3 pulse 92 respiratory rate 28 blood pressure 140/86 saturations 99%  Ventilator Settings assist-control FiO2 28% tidal volume 500 PEEP 5  General: Comfortable at this time Neck: supple Cardiovascular: no malignant arrhythmias Respiratory: No rhonchi no rales Skin: no rash seen on limited exam Musculoskeletal: No gross abnormality Psychiatric:unable to assess Neurologic:no involuntary movements         Lab Data:   Basic Metabolic Panel: Recent Labs  Lab 03/31/22 0252 04/01/22 0155 04/03/22 0217 04/04/22 0217  NA 139  --  138  --   K 3.7  --  3.6  --   CL 109  --  107  --   CO2 20*  --  21*  --   GLUCOSE 115*  --  120*  --   BUN 18  --  20  --   CREATININE <0.30*  --  <0.30*  --   CALCIUM 9.1  --  9.1  --   MG 1.8 1.8 1.9 1.9  PHOS  --  3.0  --  2.9    ABG: No results for input(s): "PHART", "PCO2ART", "PO2ART", "HCO3", "O2SAT" in the last 168 hours.  Liver Function Tests: No results for input(s): "AST", "ALT", "ALKPHOS", "BILITOT", "PROT", "ALBUMIN" in the last 168 hours. No results for input(s): "LIPASE", "AMYLASE" in the last 168 hours. No results for input(s): "AMMONIA" in the last 168 hours.  CBC: Recent Labs  Lab 04/03/22 0217 04/06/22 1109  WBC 5.6 5.4  HGB 8.2* 8.4*  HCT 26.1* 26.5*  MCV 87.6 86.0  PLT 159 164    Cardiac Enzymes: No results for input(s): "CKTOTAL", "CKMB", "CKMBINDEX", "TROPONINI" in the last 168 hours.  BNP  (last 3 results) No results for input(s): "BNP" in the last 8760 hours.  ProBNP (last 3 results) No results for input(s): "PROBNP" in the last 8760 hours.  Radiological Exams: DG Abd Portable 1V  Result Date: 04/05/2022 CLINICAL DATA:  Ileus. EXAM: PORTABLE ABDOMEN - 1 VIEW COMPARISON:  April 03, 2021. FINDINGS: Stable mild small bowel dilatation is noted most consistent with ileus. No significant colonic dilatation is noted. No radio-opaque calculi or other significant radiographic abnormality are seen. IMPRESSION: Stable mild small bowel dilatation is noted most consistent with ileus. Electronically Signed   By: Marijo Conception M.D.   On: 04/05/2022 21:13    Assessment/Plan Active Problems:   ALS (amyotrophic lateral sclerosis) (HCC)   Septic shock (HCC)   Pseudomonas pneumonia (HCC)   Autonomic dysfunction   Acute on chronic respiratory failure with hypoxia (HCC)   Acute on chronic respiratory failure hypoxia patient is going to continue on full support continue pulmonary toilet follow along closely. Sepsis with shock hemodynamics are stable Pseudomonas pneumonia has been treated Autonomic dysfunction no change ALS advanced severe disease   I have personally seen and evaluated the patient, evaluated laboratory and imaging results, formulated the assessment and plan and placed orders. The Patient requires high complexity decision making with multiple systems involvement.  Rounds were done with  the Respiratory Therapy Director and Staff therapists and discussed with nursing staff also.  Allyne Gee, MD [facility] Pulmonary Critical Care Medicine Sleep Medicine

## 2022-04-07 DIAGNOSIS — J151 Pneumonia due to Pseudomonas: Secondary | ICD-10-CM | POA: Diagnosis not present

## 2022-04-07 DIAGNOSIS — A419 Sepsis, unspecified organism: Secondary | ICD-10-CM | POA: Diagnosis not present

## 2022-04-07 DIAGNOSIS — G1221 Amyotrophic lateral sclerosis: Secondary | ICD-10-CM | POA: Diagnosis not present

## 2022-04-07 DIAGNOSIS — G909 Disorder of the autonomic nervous system, unspecified: Secondary | ICD-10-CM | POA: Diagnosis not present

## 2022-04-07 DIAGNOSIS — J9621 Acute and chronic respiratory failure with hypoxia: Secondary | ICD-10-CM | POA: Diagnosis not present

## 2022-04-07 DIAGNOSIS — R6521 Severe sepsis with septic shock: Secondary | ICD-10-CM | POA: Diagnosis not present

## 2022-04-07 LAB — PHOSPHORUS: Phosphorus: 3.1 mg/dL (ref 2.5–4.6)

## 2022-04-07 LAB — TRIGLYCERIDES: Triglycerides: 31 mg/dL (ref <150)

## 2022-04-07 LAB — MAGNESIUM: Magnesium: 1.8 mg/dL (ref 1.7–2.4)

## 2022-04-07 NOTE — Progress Notes (Signed)
Pulmonary Critical Care Medicine [place]   PULMONARY CRITICAL CARE SERVICE  PROGRESS NOTE     [name]  [id number]:[id number]  DOB: [birth year]   DOA: 01/08/2022  Referring Physician: Satira Sark, MD  HPI: Jerry [name] is a 51 y.o. male being followed for ventilator/airway/oxygen weaning Acute on Chronic Respiratory Failure.  Patient currently is on the ventilator full support  Medications: Reviewed on Rounds  Physical Exam:  Vitals: Temperature is 97.5 pulse 73 respiratory rate is 30 blood pressure is 110/62 saturations 99%  Ventilator Settings on assist control FiO2 28% PEEP 5 tidal volume 500  General: Comfortable at this time Neck: supple Cardiovascular: no malignant arrhythmias Respiratory: Coarse breath sounds noted bilaterally Skin: no rash seen on limited exam Musculoskeletal: No gross abnormality Psychiatric:unable to assess Neurologic:no involuntary movements         Lab Data:   Basic Metabolic Panel: Recent Labs  Lab 04/01/22 0155 04/03/22 0217 04/04/22 0217 04/06/22 1109 04/07/22 0141  NA  --  138  --  138  --   K  --  3.6  --  3.7  --   CL  --  107  --  107  --   CO2  --  21*  --  23  --   GLUCOSE  --  120*  --  131*  --   BUN  --  20  --  19  --   CREATININE  --  <0.30*  --  <0.30*  --   CALCIUM  --  9.1  --  9.4  --   MG 1.8 1.9 1.9 1.8 1.8  PHOS 3.0  --  2.9  --  3.1    ABG: No results for input(s): "PHART", "PCO2ART", "PO2ART", "HCO3", "O2SAT" in the last 168 hours.  Liver Function Tests: No results for input(s): "AST", "ALT", "ALKPHOS", "BILITOT", "PROT", "ALBUMIN" in the last 168 hours. No results for input(s): "LIPASE", "AMYLASE" in the last 168 hours. No results for input(s): "AMMONIA" in the last 168 hours.  CBC: Recent Labs  Lab 04/03/22 0217 04/06/22 1109  WBC 5.6 5.4  HGB 8.2* 8.4*  HCT 26.1* 26.5*  MCV 87.6 86.0  PLT 159 164    Cardiac Enzymes: No results for input(s): "CKTOTAL", "CKMB",  "CKMBINDEX", "TROPONINI" in the last 168 hours.  BNP (last 3 results) No results for input(s): "BNP" in the last 8760 hours.  ProBNP (last 3 results) No results for input(s): "PROBNP" in the last 8760 hours.  Radiological Exams: DG Abd Portable 1V  Result Date: 04/05/2022 CLINICAL DATA:  Ileus. EXAM: PORTABLE ABDOMEN - 1 VIEW COMPARISON:  April 03, 2021. FINDINGS: Stable mild small bowel dilatation is noted most consistent with ileus. No significant colonic dilatation is noted. No radio-opaque calculi or other significant radiographic abnormality are seen. IMPRESSION: Stable mild small bowel dilatation is noted most consistent with ileus. Electronically Signed   By: Marijo Conception M.D.   On: 04/05/2022 21:13    Assessment/Plan Active Problems:   ALS (amyotrophic lateral sclerosis) (HCC)   Septic shock (HCC)   Pseudomonas pneumonia (HCC)   Autonomic dysfunction   Acute on chronic respiratory failure with hypoxia (HCC)   Acute on chronic respiratory failure hypoxia plan is to continue with full support on the ventilator patient's home training is underway ALS supportive care prognosis is poor Sepsis with shock resolved Pseudomonas pneumonia has been treated with antibiotics Autonomic dysfunction supportive care   I have personally seen and evaluated the patient, evaluated  laboratory and imaging results, formulated the assessment and plan and placed orders. The Patient requires high complexity decision making with multiple systems involvement.  Rounds were done with the Respiratory Therapy Director and Staff therapists and discussed with nursing staff also.  Allyne Gee, MD [facility] Pulmonary Critical Care Medicine Sleep Medicine

## 2022-04-08 ENCOUNTER — Other Ambulatory Visit (HOSPITAL_COMMUNITY): Payer: Self-pay

## 2022-04-08 DIAGNOSIS — J151 Pneumonia due to Pseudomonas: Secondary | ICD-10-CM | POA: Diagnosis not present

## 2022-04-08 DIAGNOSIS — R6521 Severe sepsis with septic shock: Secondary | ICD-10-CM | POA: Diagnosis not present

## 2022-04-08 DIAGNOSIS — J9621 Acute and chronic respiratory failure with hypoxia: Secondary | ICD-10-CM | POA: Diagnosis not present

## 2022-04-08 DIAGNOSIS — G1221 Amyotrophic lateral sclerosis: Secondary | ICD-10-CM | POA: Diagnosis not present

## 2022-04-08 DIAGNOSIS — J189 Pneumonia, unspecified organism: Secondary | ICD-10-CM | POA: Diagnosis not present

## 2022-04-08 DIAGNOSIS — G909 Disorder of the autonomic nervous system, unspecified: Secondary | ICD-10-CM | POA: Diagnosis not present

## 2022-04-08 DIAGNOSIS — A419 Sepsis, unspecified organism: Secondary | ICD-10-CM | POA: Diagnosis not present

## 2022-04-08 DIAGNOSIS — K567 Ileus, unspecified: Secondary | ICD-10-CM | POA: Diagnosis not present

## 2022-04-08 DIAGNOSIS — K6389 Other specified diseases of intestine: Secondary | ICD-10-CM | POA: Diagnosis not present

## 2022-04-08 NOTE — Progress Notes (Signed)
Pulmonary Critical Care Medicine [place]   PULMONARY CRITICAL CARE SERVICE  PROGRESS NOTE     Jerry [name]  [id number]:[id number]  DOB: 09/20/1970   DOA: 01/08/2022  Referring Physician: Satira Sark, MD  HPI: Jerry [name] is a 51 y.o. male being followed for ventilator/airway/oxygen weaning Acute on Chronic Respiratory Failure.  Patient is on assist control mode full support at this time  Medications: Reviewed on Rounds  Physical Exam:  Vitals: Temperature 96.5 pulse of 62 respiratory rate is 28 blood pressure 109/67 saturations 100%  Ventilator Settings assist-control FiO2 28% PEEP 5 tidal volume 500  General: Comfortable at this time Neck: supple Cardiovascular: no malignant arrhythmias Respiratory: No rhonchi no rales are noted Skin: no rash seen on limited exam Musculoskeletal: No gross abnormality Psychiatric:unable to assess Neurologic:no involuntary movements         Lab Data:   Basic Metabolic Panel: Recent Labs  Lab 04/03/22 0217 04/04/22 0217 04/06/22 1109 04/07/22 0141  NA 138  --  138  --   K 3.6  --  3.7  --   CL 107  --  107  --   CO2 21*  --  23  --   GLUCOSE 120*  --  131*  --   BUN 20  --  19  --   CREATININE <0.30*  --  <0.30*  --   CALCIUM 9.1  --  9.4  --   MG 1.9 1.9 1.8 1.8  PHOS  --  2.9  --  3.1    ABG: No results for input(s): "PHART", "PCO2ART", "PO2ART", "HCO3", "O2SAT" in the last 168 hours.  Liver Function Tests: No results for input(s): "AST", "ALT", "ALKPHOS", "BILITOT", "PROT", "ALBUMIN" in the last 168 hours. No results for input(s): "LIPASE", "AMYLASE" in the last 168 hours. No results for input(s): "AMMONIA" in the last 168 hours.  CBC: Recent Labs  Lab 04/03/22 0217 04/06/22 1109  WBC 5.6 5.4  HGB 8.2* 8.4*  HCT 26.1* 26.5*  MCV 87.6 86.0  PLT 159 164    Cardiac Enzymes: No results for input(s): "CKTOTAL", "CKMB", "CKMBINDEX", "TROPONINI" in the last 168 hours.  BNP (last 3  results) No results for input(s): "BNP" in the last 8760 hours.  ProBNP (last 3 results) No results for input(s): "PROBNP" in the last 8760 hours.  Radiological Exams: DG Chest Port 1 View  Result Date: 04/08/2022 CLINICAL DATA:  Pneumonia EXAM: PORTABLE CHEST 1 VIEW COMPARISON:  03/04/2022 FINDINGS: Single frontal view of the chest demonstrates a stable right chest wall port and tracheostomy tube. Right-sided PICC has changed position, tip crossing midline now overlying the left brachiocephalic confluence. Please correlate with catheter function. Percutaneous gastrostomy tube overlies the left upper quadrant abdomen. Cardiac silhouette is unremarkable. Persistent bibasilar airspace disease, left greater than right, which may reflect aspiration or infection. No effusion or pneumothorax. No acute bony abnormalities. Stable gaseous distention of the bowel compatible with history of ileus. IMPRESSION: 1. Bibasilar airspace disease, left greater than right, consistent with aspiration or infection. 2. Change in position of the right-sided PICC, with tip now projecting over the left brachiocephalic vein. Please correlate with catheter function. 3. Continued gaseous distention of the bowel consistent with ileus. Electronically Signed   By: Randa Ngo M.D.   On: 04/08/2022 15:29   DG Abd Portable 1V  Result Date: 04/08/2022 CLINICAL DATA:  98749.  Follow-up ileus. EXAM: PORTABLE ABDOMEN - 1 VIEW COMPARISON:  Study of 04/05/2022 FINDINGS: 5:54 a.m. Mild generalized small  bowel dilatation continues raising consistent with ileus. This is slightly improved. Colonic aeration is present at least as far as the sigmoid colon if not all the way through to the rectum. Left upper abdominal PEG tube is redemonstrated and overlying monitor wiring. There is persistent consolidation in the left lower lobe. Right lung base is clear. There is no supine evidence of free air. No pathologic calcification is seen. IMPRESSION:  Mild generalized small bowel gas distention with improvement. Electronically Signed   By: Telford Nab M.D.   On: 04/08/2022 06:29    Assessment/Plan Active Problems:   ALS (amyotrophic lateral sclerosis) (HCC)   Septic shock (HCC)   Pseudomonas pneumonia (HCC)   Autonomic dysfunction   Acute on chronic respiratory failure with hypoxia (HCC)   Acute on chronic respiratory failure hypoxia plan is going to be to continue with full support on the ventilator continue with home training Severe sepsis with shock hemodynamics are stable Pseudomonas pneumonia has been treated Autonomic dysfunction no change we will continue to follow along ALS severe disease we will continue to follow   I have personally seen and evaluated the patient, evaluated laboratory and imaging results, formulated the assessment and plan and placed orders. The Patient requires high complexity decision making with multiple systems involvement.  Rounds were done with the Respiratory Therapy Director and Staff therapists and discussed with nursing staff also.  Allyne Gee, MD [facility] Pulmonary Critical Care Medicine Sleep Medicine

## 2022-04-09 ENCOUNTER — Other Ambulatory Visit (HOSPITAL_COMMUNITY): Payer: Self-pay

## 2022-04-09 DIAGNOSIS — G1221 Amyotrophic lateral sclerosis: Secondary | ICD-10-CM | POA: Diagnosis not present

## 2022-04-09 DIAGNOSIS — A419 Sepsis, unspecified organism: Secondary | ICD-10-CM | POA: Diagnosis not present

## 2022-04-09 DIAGNOSIS — R6521 Severe sepsis with septic shock: Secondary | ICD-10-CM | POA: Diagnosis not present

## 2022-04-09 DIAGNOSIS — J151 Pneumonia due to Pseudomonas: Secondary | ICD-10-CM | POA: Diagnosis not present

## 2022-04-09 DIAGNOSIS — J9621 Acute and chronic respiratory failure with hypoxia: Secondary | ICD-10-CM | POA: Diagnosis not present

## 2022-04-09 DIAGNOSIS — G909 Disorder of the autonomic nervous system, unspecified: Secondary | ICD-10-CM | POA: Diagnosis not present

## 2022-04-09 DIAGNOSIS — Z452 Encounter for adjustment and management of vascular access device: Secondary | ICD-10-CM | POA: Diagnosis not present

## 2022-04-09 NOTE — Progress Notes (Signed)
Pulmonary Critical Care Medicine [place]   PULMONARY CRITICAL CARE SERVICE  PROGRESS NOTE     Artem Bunte  [id number]  DOB: 27-Mar-1971   DOA: 01/08/2022  Referring Physician: Satira Sark, MD  HPI: Jerry [name] is a 51 y.o. male being followed for ventilator/airway/oxygen weaning Acute on Chronic Respiratory Failure.  Patient is on the ventilator on assist control mode has been on 28% FiO2 with good saturations  Medications: Reviewed on Rounds  Physical Exam:  Vitals: Temperature 97.7 pulse 62 respiratory 30 blood pressure 118/81 saturation 100%  Ventilator Settings assist-control FiO2 is 28% tidal volume 500 PEEP 5  General: Comfortable at this time Neck: supple Cardiovascular: no malignant arrhythmias Respiratory: No rhonchi no rales Skin: no rash seen on limited exam Musculoskeletal: No gross abnormality Psychiatric:unable to assess Neurologic:no involuntary movements         Lab Data:   Basic Metabolic Panel: Recent Labs  Lab 04/03/22 0217 04/04/22 0217 04/06/22 1109 04/07/22 0141  NA 138  --  138  --   K 3.6  --  3.7  --   CL 107  --  107  --   CO2 21*  --  23  --   GLUCOSE 120*  --  131*  --   BUN 20  --  19  --   CREATININE <0.30*  --  <0.30*  --   CALCIUM 9.1  --  9.4  --   MG 1.9 1.9 1.8 1.8  PHOS  --  2.9  --  3.1    ABG: No results for input(s): "PHART", "PCO2ART", "PO2ART", "HCO3", "O2SAT" in the last 168 hours.  Liver Function Tests: No results for input(s): "AST", "ALT", "ALKPHOS", "BILITOT", "PROT", "ALBUMIN" in the last 168 hours. No results for input(s): "LIPASE", "AMYLASE" in the last 168 hours. No results for input(s): "AMMONIA" in the last 168 hours.  CBC: Recent Labs  Lab 04/03/22 0217 04/06/22 1109  WBC 5.6 5.4  HGB 8.2* 8.4*  HCT 26.1* 26.5*  MCV 87.6 86.0  PLT 159 164    Cardiac Enzymes: No results for input(s): "CKTOTAL", "CKMB", "CKMBINDEX", "TROPONINI" in the last 168 hours.  BNP  (last 3 results) No results for input(s): "BNP" in the last 8760 hours.  ProBNP (last 3 results) No results for input(s): "PROBNP" in the last 8760 hours.  Radiological Exams: DG Chest Port 1 View  Result Date: 04/08/2022 CLINICAL DATA:  Pneumonia EXAM: PORTABLE CHEST 1 VIEW COMPARISON:  03/04/2022 FINDINGS: Single frontal view of the chest demonstrates a stable right chest wall port and tracheostomy tube. Right-sided PICC has changed position, tip crossing midline now overlying the left brachiocephalic confluence. Please correlate with catheter function. Percutaneous gastrostomy tube overlies the left upper quadrant abdomen. Cardiac silhouette is unremarkable. Persistent bibasilar airspace disease, left greater than right, which may reflect aspiration or infection. No effusion or pneumothorax. No acute bony abnormalities. Stable gaseous distention of the bowel compatible with history of ileus. IMPRESSION: 1. Bibasilar airspace disease, left greater than right, consistent with aspiration or infection. 2. Change in position of the right-sided PICC, with tip now projecting over the left brachiocephalic vein. Please correlate with catheter function. 3. Continued gaseous distention of the bowel consistent with ileus. Electronically Signed   By: Randa Ngo M.D.   On: 04/08/2022 15:29   DG Abd Portable 1V  Result Date: 04/08/2022 CLINICAL DATA:  [id number].  Follow-up ileus. EXAM: PORTABLE ABDOMEN - 1 VIEW COMPARISON:  Study of 04/05/2022 FINDINGS: 5:54 a.m. Mild  generalized small bowel dilatation continues raising consistent with ileus. This is slightly improved. Colonic aeration is present at least as far as the sigmoid colon if not all the way through to the rectum. Left upper abdominal PEG tube is redemonstrated and overlying monitor wiring. There is persistent consolidation in the left lower lobe. Right lung base is clear. There is no supine evidence of free air. No pathologic calcification is seen.  IMPRESSION: Mild generalized small bowel gas distention with improvement. Electronically Signed   By: Telford Nab M.D.   On: 04/08/2022 06:29    Assessment/Plan Active Problems:   ALS (amyotrophic lateral sclerosis) (HCC)   Septic shock (HCC)   Pseudomonas pneumonia (HCC)   Autonomic dysfunction   Acute on chronic respiratory failure with hypoxia (HCC)   Acute on chronic respiratory failure hypoxia we will continue with full support on assist control saturations are good we will continue to monitor. ALS severe advanced disease Sepsis with shock supportive care Autonomic dysfunction no change Pseudomonas pneumonia has been treated   I have personally seen and evaluated the patient, evaluated laboratory and imaging results, formulated the assessment and plan and placed orders. The Patient requires high complexity decision making with multiple systems involvement.  Rounds were done with the Respiratory Therapy Director and Staff therapists and discussed with nursing staff also.  Allyne Gee, MD [facility] Pulmonary Critical Care Medicine Sleep Medicine

## 2022-04-10 ENCOUNTER — Other Ambulatory Visit (HOSPITAL_COMMUNITY): Payer: Self-pay

## 2022-04-10 DIAGNOSIS — A419 Sepsis, unspecified organism: Secondary | ICD-10-CM | POA: Diagnosis not present

## 2022-04-10 DIAGNOSIS — K6389 Other specified diseases of intestine: Secondary | ICD-10-CM | POA: Diagnosis not present

## 2022-04-10 DIAGNOSIS — K567 Ileus, unspecified: Secondary | ICD-10-CM | POA: Diagnosis not present

## 2022-04-10 DIAGNOSIS — G909 Disorder of the autonomic nervous system, unspecified: Secondary | ICD-10-CM | POA: Diagnosis not present

## 2022-04-10 DIAGNOSIS — J9621 Acute and chronic respiratory failure with hypoxia: Secondary | ICD-10-CM | POA: Diagnosis not present

## 2022-04-10 DIAGNOSIS — R6521 Severe sepsis with septic shock: Secondary | ICD-10-CM | POA: Diagnosis not present

## 2022-04-10 DIAGNOSIS — G1221 Amyotrophic lateral sclerosis: Secondary | ICD-10-CM | POA: Diagnosis not present

## 2022-04-10 DIAGNOSIS — J151 Pneumonia due to Pseudomonas: Secondary | ICD-10-CM | POA: Diagnosis not present

## 2022-04-10 LAB — BASIC METABOLIC PANEL
Anion gap: 6 (ref 5–15)
BUN: 19 mg/dL (ref 6–20)
CO2: 25 mmol/L (ref 22–32)
Calcium: 9.2 mg/dL (ref 8.9–10.3)
Chloride: 107 mmol/L (ref 98–111)
Creatinine, Ser: <0.3 mg/dL — ABNORMAL LOW (ref 0.61–1.24)
Glucose, Bld: 111 mg/dL — ABNORMAL HIGH (ref 70–99)
Potassium: 3.8 mmol/L (ref 3.5–5.1)
Sodium: 138 mmol/L (ref 135–145)

## 2022-04-10 LAB — CBC
HCT: 28.6 % — ABNORMAL LOW (ref 39.0–52.0)
Hemoglobin: 9.1 g/dL — ABNORMAL LOW (ref 13.0–17.0)
MCH: 27.5 pg (ref 26.0–34.0)
MCHC: 31.8 g/dL (ref 30.0–36.0)
MCV: 86.4 fL (ref 80.0–100.0)
Platelets: 139 10*3/uL — ABNORMAL LOW (ref 150–400)
RBC: 3.31 MIL/uL — ABNORMAL LOW (ref 4.22–5.81)
RDW: 16.5 % — ABNORMAL HIGH (ref 11.5–15.5)
WBC: 4.5 10*3/uL (ref 4.0–10.5)
nRBC: 0 % (ref 0.0–0.2)

## 2022-04-10 LAB — PHOSPHORUS: Phosphorus: 3.4 mg/dL (ref 2.5–4.6)

## 2022-04-10 LAB — MAGNESIUM: Magnesium: 1.9 mg/dL (ref 1.7–2.4)

## 2022-04-10 NOTE — Progress Notes (Signed)
Pulmonary Critical Care Medicine [place]   PULMONARY CRITICAL CARE SERVICE  PROGRESS NOTE     Gene Glazebrook  [id number]:[id number]  DOB: Dec 24, 1970   DOA: 01/08/2022  Referring Physician: Satira Sark, MD  HPI: Jeffery Bachmeier is a 51 y.o. male being followed for ventilator/airway/oxygen weaning Acute on Chronic Respiratory Failure. ***  Medications: Reviewed on Rounds  Physical Exam:  Vitals: ***  Ventilator Settings ***  General: Comfortable at this time Neck: supple Cardiovascular: no malignant arrhythmias Respiratory: *** Skin: no rash seen on limited exam Musculoskeletal: No gross abnormality Psychiatric:unable to assess Neurologic:no involuntary movements         Lab Data:   Basic Metabolic Panel: Recent Labs  Lab 04/04/22 0217 04/06/22 1109 04/07/22 0141 04/10/22 0135  NA  --  138  --  138  K  --  3.7  --  3.8  CL  --  107  --  107  CO2  --  23  --  25  GLUCOSE  --  131*  --  111*  BUN  --  19  --  19  CREATININE  --  <0.30*  --  <0.30*  CALCIUM  --  9.4  --  9.2  MG 1.9 1.8 1.8 1.9  PHOS 2.9  --  3.1 3.4    ABG: No results for input(s): "PHART", "PCO2ART", "PO2ART", "HCO3", "O2SAT" in the last 168 hours.  Liver Function Tests: No results for input(s): "AST", "ALT", "ALKPHOS", "BILITOT", "PROT", "ALBUMIN" in the last 168 hours. No results for input(s): "LIPASE", "AMYLASE" in the last 168 hours. No results for input(s): "AMMONIA" in the last 168 hours.  CBC: Recent Labs  Lab 04/06/22 1109 04/10/22 0135  WBC 5.4 4.5  HGB 8.4* 9.1*  HCT 26.5* 28.6*  MCV 86.0 86.4  PLT 164 139*    Cardiac Enzymes: No results for input(s): "CKTOTAL", "CKMB", "CKMBINDEX", "TROPONINI" in the last 168 hours.  BNP (last 3 results) No results for input(s): "BNP" in the last 8760 hours.  ProBNP (last 3 results) No results for input(s): "PROBNP" in the last 8760 hours.  Radiological Exams: DG Abd Portable 1V  Result Date:  04/10/2022 CLINICAL DATA:  Ileus. EXAM: PORTABLE ABDOMEN - 1 VIEW COMPARISON:  04/08/2022 radiograph and prior studies FINDINGS: Mildly distended gas-filled loops of small bowel and colon are again noted. Gas in the rectum is present. Percutaneous gastrostomy tube is again noted overlying the UPPER LEFT abdomen. There has been little interval change since the prior study. IMPRESSION: Unchanged appearance of the abdomen with mildly distended gas-filled small bowel and colon. Electronically Signed   By: Margarette Canada M.D.   On: 04/10/2022 08:33   DG CHEST PORT 1 VIEW  Result Date: 04/09/2022 CLINICAL DATA:  Central line placement. EXAM: PORTABLE CHEST 1 VIEW COMPARISON:  April 08, 2022 FINDINGS: There is stable tracheostomy tube and right-sided venous Port-A-Cath positioning. The previously noted right-sided PICC line has been repositioned. Its distal tip sits within the right atrium, approximately 3.2 cm distal to the junction of the superior vena cava and right atrium. The heart size and mediastinal contours are within normal limits. There is stable elevation of the left hemidiaphragm with mild, stable left basilar atelectasis and/or infiltrate. There is no evidence of a pleural effusion or pneumothorax. The visualized skeletal structures are unremarkable. IMPRESSION: 1. Interval right-sided PICC line repositioning, as described above. 2. Mild, stable left basilar atelectasis and/or infiltrate. Electronically Signed   By: Virgina Norfolk M.D.   On:  04/09/2022 17:26   DG Chest Port 1 View  Result Date: 04/08/2022 CLINICAL DATA:  Pneumonia EXAM: PORTABLE CHEST 1 VIEW COMPARISON:  03/04/2022 FINDINGS: Single frontal view of the chest demonstrates a stable right chest wall port and tracheostomy tube. Right-sided PICC has changed position, tip crossing midline now overlying the left brachiocephalic confluence. Please correlate with catheter function. Percutaneous gastrostomy tube overlies the left upper quadrant  abdomen. Cardiac silhouette is unremarkable. Persistent bibasilar airspace disease, left greater than right, which may reflect aspiration or infection. No effusion or pneumothorax. No acute bony abnormalities. Stable gaseous distention of the bowel compatible with history of ileus. IMPRESSION: 1. Bibasilar airspace disease, left greater than right, consistent with aspiration or infection. 2. Change in position of the right-sided PICC, with tip now projecting over the left brachiocephalic vein. Please correlate with catheter function. 3. Continued gaseous distention of the bowel consistent with ileus. Electronically Signed   By: Randa Ngo M.D.   On: 04/08/2022 15:29    Assessment/Plan Active Problems:   ALS (amyotrophic lateral sclerosis) (HCC)   Septic shock (HCC)   Pseudomonas pneumonia (HCC)   Autonomic dysfunction   Acute on chronic respiratory failure with hypoxia (HCC)   *** *** *** *** ***   I have personally seen and evaluated the patient, evaluated laboratory and imaging results, formulated the assessment and plan and placed orders. The Patient requires high complexity decision making with multiple systems involvement.  Rounds were done with the Respiratory Therapy Director and Staff therapists and discussed with nursing staff also.  Allyne Gee, MD [facility] Pulmonary Critical Care Medicine Sleep Medicine

## 2022-04-11 DIAGNOSIS — J151 Pneumonia due to Pseudomonas: Secondary | ICD-10-CM | POA: Diagnosis not present

## 2022-04-11 DIAGNOSIS — G909 Disorder of the autonomic nervous system, unspecified: Secondary | ICD-10-CM | POA: Diagnosis not present

## 2022-04-11 DIAGNOSIS — J9621 Acute and chronic respiratory failure with hypoxia: Secondary | ICD-10-CM | POA: Diagnosis not present

## 2022-04-11 DIAGNOSIS — A419 Sepsis, unspecified organism: Secondary | ICD-10-CM | POA: Diagnosis not present

## 2022-04-11 DIAGNOSIS — R6521 Severe sepsis with septic shock: Secondary | ICD-10-CM | POA: Diagnosis not present

## 2022-04-11 DIAGNOSIS — G1221 Amyotrophic lateral sclerosis: Secondary | ICD-10-CM | POA: Diagnosis not present

## 2022-04-11 LAB — CULTURE, RESPIRATORY W GRAM STAIN

## 2022-04-11 NOTE — Progress Notes (Signed)
Pulmonary Critical Care Medicine [place]   PULMONARY CRITICAL CARE SERVICE  PROGRESS NOTE     Jerry [name]  [id number]  DOB: 1971-06-15   DOA: 01/08/2022  Referring Physician: Satira Sark, MD  HPI: Jerry [name] is a 51 y.o. male being followed for ventilator/airway/oxygen weaning Acute on Chronic Respiratory Failure.  Patient is on assist control mode currently on 28% FiO2 saturations are good  Medications: Reviewed on Rounds  Physical Exam:  Vitals: Temperature is 97.1 pulse 61 respiratory is 18 blood pressure is 107/66 saturations 100%  Ventilator Settings on assist control FiO2 28% tidal volume 500 PEEP of 5  General: Comfortable at this time Neck: supple Cardiovascular: no malignant arrhythmias Respiratory: No rhonchi no rales are noted Skin: no rash seen on limited exam Musculoskeletal: No gross abnormality Psychiatric:unable to assess Neurologic:no involuntary movements         Lab Data:   Basic Metabolic Panel: Recent Labs  Lab 04/06/22 1109 04/07/22 0141 04/10/22 0135  NA 138  --  138  K 3.7  --  3.8  CL 107  --  107  CO2 23  --  25  GLUCOSE 131*  --  111*  BUN 19  --  19  CREATININE <0.30*  --  <0.30*  CALCIUM 9.4  --  9.2  MG 1.8 1.8 1.9  PHOS  --  3.1 3.4    ABG: No results for input(s): "PHART", "PCO2ART", "PO2ART", "HCO3", "O2SAT" in the last 168 hours.  Liver Function Tests: No results for input(s): "AST", "ALT", "ALKPHOS", "BILITOT", "PROT", "ALBUMIN" in the last 168 hours. No results for input(s): "LIPASE", "AMYLASE" in the last 168 hours. No results for input(s): "AMMONIA" in the last 168 hours.  CBC: Recent Labs  Lab 04/06/22 1109 04/10/22 0135  WBC 5.4 4.5  HGB 8.4* 9.1*  HCT 26.5* 28.6*  MCV 86.0 86.4  PLT 164 139*    Cardiac Enzymes: No results for input(s): "CKTOTAL", "CKMB", "CKMBINDEX", "TROPONINI" in the last 168 hours.  BNP (last 3 results) No results for input(s): "BNP" in the last  8760 hours.  ProBNP (last 3 results) No results for input(s): "PROBNP" in the last 8760 hours.  Radiological Exams: DG Abd Portable 1V  Result Date: 04/10/2022 CLINICAL DATA:  Ileus. EXAM: PORTABLE ABDOMEN - 1 VIEW COMPARISON:  04/08/2022 radiograph and prior studies FINDINGS: Mildly distended gas-filled loops of small bowel and colon are again noted. Gas in the rectum is present. Percutaneous gastrostomy tube is again noted overlying the UPPER LEFT abdomen. There has been little interval change since the prior study. IMPRESSION: Unchanged appearance of the abdomen with mildly distended gas-filled small bowel and colon. Electronically Signed   By: Margarette Canada M.D.   On: 04/10/2022 08:33   DG CHEST PORT 1 VIEW  Result Date: 04/09/2022 CLINICAL DATA:  Central line placement. EXAM: PORTABLE CHEST 1 VIEW COMPARISON:  April 08, 2022 FINDINGS: There is stable tracheostomy tube and right-sided venous Port-A-Cath positioning. The previously noted right-sided PICC line has been repositioned. Its distal tip sits within the right atrium, approximately 3.2 cm distal to the junction of the superior vena cava and right atrium. The heart size and mediastinal contours are within normal limits. There is stable elevation of the left hemidiaphragm with mild, stable left basilar atelectasis and/or infiltrate. There is no evidence of a pleural effusion or pneumothorax. The visualized skeletal structures are unremarkable. IMPRESSION: 1. Interval right-sided PICC line repositioning, as described above. 2. Mild, stable left basilar atelectasis and/or  infiltrate. Electronically Signed   By: Virgina Norfolk M.D.   On: 04/09/2022 17:26    Assessment/Plan Active Problems:   ALS (amyotrophic lateral sclerosis) (HCC)   Septic shock (HCC)   Pseudomonas pneumonia (HCC)   Autonomic dysfunction   Acute on chronic respiratory failure with hypoxia (HCC)   Acute on chronic respiratory failure hypoxia plan is to continue with  full support on the ventilator patient is supposed to be getting home training Sepsis with shock no change we will continue with supportive care Pseudomonas pneumonia has been treated with antibiotics Autonomic dysfunction appears to be stable ALS advanced severe disease we will continue to monitor   I have personally seen and evaluated the patient, evaluated laboratory and imaging results, formulated the assessment and plan and placed orders. The Patient requires high complexity decision making with multiple systems involvement.  Rounds were done with the Respiratory Therapy Director and Staff therapists and discussed with nursing staff also.  Allyne Gee, MD [facility] Pulmonary Critical Care Medicine Sleep Medicine

## 2022-04-12 DIAGNOSIS — G909 Disorder of the autonomic nervous system, unspecified: Secondary | ICD-10-CM | POA: Diagnosis not present

## 2022-04-12 DIAGNOSIS — A419 Sepsis, unspecified organism: Secondary | ICD-10-CM | POA: Diagnosis not present

## 2022-04-12 DIAGNOSIS — G1221 Amyotrophic lateral sclerosis: Secondary | ICD-10-CM | POA: Diagnosis not present

## 2022-04-12 DIAGNOSIS — R6521 Severe sepsis with septic shock: Secondary | ICD-10-CM | POA: Diagnosis not present

## 2022-04-12 DIAGNOSIS — J9621 Acute and chronic respiratory failure with hypoxia: Secondary | ICD-10-CM | POA: Diagnosis not present

## 2022-04-12 DIAGNOSIS — J151 Pneumonia due to Pseudomonas: Secondary | ICD-10-CM | POA: Diagnosis not present

## 2022-04-12 LAB — BASIC METABOLIC PANEL
Anion gap: 9 (ref 5–15)
BUN: 19 mg/dL (ref 6–20)
CO2: 25 mmol/L (ref 22–32)
Calcium: 9.5 mg/dL (ref 8.9–10.3)
Chloride: 106 mmol/L (ref 98–111)
Creatinine, Ser: <0.3 mg/dL — ABNORMAL LOW (ref 0.61–1.24)
Glucose, Bld: 103 mg/dL — ABNORMAL HIGH (ref 70–99)
Potassium: 3.6 mmol/L (ref 3.5–5.1)
Sodium: 140 mmol/L (ref 135–145)

## 2022-04-12 LAB — MAGNESIUM: Magnesium: 1.9 mg/dL (ref 1.7–2.4)

## 2022-04-12 NOTE — Progress Notes (Signed)
Pulmonary Critical Care Medicine [place]   PULMONARY CRITICAL CARE SERVICE  PROGRESS NOTE     Jerry [name]  [id number]:[id number]  DOB: 23-Dec-1970   DOA: 01/08/2022  Referring Physician: Satira Sark, MD  HPI: Jerry [name] is a 51 y.o. male being followed for ventilator/airway/oxygen weaning Acute on Chronic Respiratory Failure.  Patient currently is on full support on the ventilator assist control mode  Medications: Reviewed on Rounds  Physical Exam:  Vitals: Temperature is 98 pulse 85 respiratory 22 blood pressure is 97/46 saturations 97%  Ventilator Settings on assist control FiO2 is 28% tidal volume 500 PEEP of 5  General: Comfortable at this time Neck: supple Cardiovascular: no malignant arrhythmias Respiratory: No rhonchi no rales are noted at this time Skin: no rash seen on limited exam Musculoskeletal: No gross abnormality Psychiatric:unable to assess Neurologic:no involuntary movements         Lab Data:   Basic Metabolic Panel: Recent Labs  Lab 04/06/22 1109 04/07/22 0141 04/10/22 0135 04/12/22 0313  NA 138  --  138 140  K 3.7  --  3.8 3.6  CL 107  --  107 106  CO2 23  --  25 25  GLUCOSE 131*  --  111* 103*  BUN 19  --  19 19  CREATININE <0.30*  --  <0.30* <0.30*  CALCIUM 9.4  --  9.2 9.5  MG 1.8 1.8 1.9 1.9  PHOS  --  3.1 3.4  --     ABG: No results for input(s): "PHART", "PCO2ART", "PO2ART", "HCO3", "O2SAT" in the last 168 hours.  Liver Function Tests: No results for input(s): "AST", "ALT", "ALKPHOS", "BILITOT", "PROT", "ALBUMIN" in the last 168 hours. No results for input(s): "LIPASE", "AMYLASE" in the last 168 hours. No results for input(s): "AMMONIA" in the last 168 hours.  CBC: Recent Labs  Lab 04/06/22 1109 04/10/22 0135  WBC 5.4 4.5  HGB 8.4* 9.1*  HCT 26.5* 28.6*  MCV 86.0 86.4  PLT 164 139*    Cardiac Enzymes: No results for input(s): "CKTOTAL", "CKMB", "CKMBINDEX", "TROPONINI" in the last 168  hours.  BNP (last 3 results) No results for input(s): "BNP" in the last 8760 hours.  ProBNP (last 3 results) No results for input(s): "PROBNP" in the last 8760 hours.  Radiological Exams: No results found.  Assessment/Plan Active Problems:   ALS (amyotrophic lateral sclerosis) (HCC)   Septic shock (HCC)   Pseudomonas pneumonia (HCC)   Autonomic dysfunction   Acute on chronic respiratory failure with hypoxia (HCC)   Acute on chronic respiratory failure hypoxia plan is going to be continue with full support Severe sepsis and shock resolved hemodynamics are stable Pseudomonas pneumonia has been treated with antibiotics ALS advanced severe disease Autonomic dysfunction supportive care   I have personally seen and evaluated the patient, evaluated laboratory and imaging results, formulated the assessment and plan and placed orders. The Patient requires high complexity decision making with multiple systems involvement.  Rounds were done with the Respiratory Therapy Director and Staff therapists and discussed with nursing staff also.  Allyne Gee, MD [facility] Pulmonary Critical Care Medicine Sleep Medicine

## 2022-04-13 ENCOUNTER — Other Ambulatory Visit (HOSPITAL_COMMUNITY): Payer: Self-pay

## 2022-04-13 ENCOUNTER — Encounter (HOSPITAL_BASED_OUTPATIENT_CLINIC_OR_DEPARTMENT_OTHER): Payer: Self-pay

## 2022-04-13 DIAGNOSIS — R6521 Severe sepsis with septic shock: Secondary | ICD-10-CM | POA: Diagnosis not present

## 2022-04-13 DIAGNOSIS — G1221 Amyotrophic lateral sclerosis: Secondary | ICD-10-CM | POA: Diagnosis not present

## 2022-04-13 DIAGNOSIS — G909 Disorder of the autonomic nervous system, unspecified: Secondary | ICD-10-CM | POA: Diagnosis not present

## 2022-04-13 DIAGNOSIS — J151 Pneumonia due to Pseudomonas: Secondary | ICD-10-CM | POA: Diagnosis not present

## 2022-04-13 DIAGNOSIS — J9621 Acute and chronic respiratory failure with hypoxia: Secondary | ICD-10-CM | POA: Diagnosis not present

## 2022-04-13 DIAGNOSIS — I878 Other specified disorders of veins: Secondary | ICD-10-CM | POA: Diagnosis not present

## 2022-04-13 DIAGNOSIS — K567 Ileus, unspecified: Secondary | ICD-10-CM | POA: Diagnosis not present

## 2022-04-13 DIAGNOSIS — M7989 Other specified soft tissue disorders: Secondary | ICD-10-CM

## 2022-04-13 DIAGNOSIS — A419 Sepsis, unspecified organism: Secondary | ICD-10-CM | POA: Diagnosis not present

## 2022-04-13 LAB — MAGNESIUM: Magnesium: 1.9 mg/dL (ref 1.7–2.4)

## 2022-04-13 LAB — PHOSPHORUS: Phosphorus: 2.4 mg/dL — ABNORMAL LOW (ref 2.5–4.6)

## 2022-04-13 NOTE — Progress Notes (Signed)
Upper extremity venous has been completed.   Preliminary results in CV Proc.   Jinny Blossom Jerris Keltz 04/13/2022 2:35 PM

## 2022-04-13 NOTE — Progress Notes (Signed)
Pulmonary Critical Care Medicine [place]   PULMONARY CRITICAL CARE SERVICE  PROGRESS NOTE     Jerry [name]  QPY:[id number]  DOB: [birth year]   DOA: 01/08/2022  Referring Physician: Satira Sark, MD  HPI: Jerry [name] is a 51 y.o. male being followed for ventilator/airway/oxygen weaning Acute on Chronic Respiratory Failure.  Patient is on assist control currently 28% FiO2 saturations are good  Medications: Reviewed on Rounds  Physical Exam:  Vitals: Temperature is 97.3 pulse 81 respiratory is 28 blood pressure is 104/58 saturations 100%  Ventilator Settings assist-control FiO2 is 28% PEEP 5 tidal volume 500  General: Comfortable at this time Neck: supple Cardiovascular: no malignant arrhythmias Respiratory: Scattered rhonchi expansion equal Skin: no rash seen on limited exam Musculoskeletal: No gross abnormality Psychiatric:unable to assess Neurologic:no involuntary movements         Lab Data:   Basic Metabolic Panel: Recent Labs  Lab 04/07/22 0141 04/10/22 0135 04/12/22 0313 04/13/22 0252  NA  --  138 140  --   K  --  3.8 3.6  --   CL  --  107 106  --   CO2  --  25 25  --   GLUCOSE  --  111* 103*  --   BUN  --  19 19  --   CREATININE  --  <0.30* <0.30*  --   CALCIUM  --  9.2 9.5  --   MG 1.8 1.9 1.9 1.9  PHOS 3.1 3.4  --  2.4*    ABG: No results for input(s): "PHART", "PCO2ART", "PO2ART", "HCO3", "O2SAT" in the last 168 hours.  Liver Function Tests: No results for input(s): "AST", "ALT", "ALKPHOS", "BILITOT", "PROT", "ALBUMIN" in the last 168 hours. No results for input(s): "LIPASE", "AMYLASE" in the last 168 hours. No results for input(s): "AMMONIA" in the last 168 hours.  CBC: Recent Labs  Lab 04/10/22 0135  WBC 4.5  HGB 9.1*  HCT 28.6*  MCV 86.4  PLT 139*    Cardiac Enzymes: No results for input(s): "CKTOTAL", "CKMB", "CKMBINDEX", "TROPONINI" in the last 168 hours.  BNP (last 3 results) No results for input(s):  "BNP" in the last 8760 hours.  ProBNP (last 3 results) No results for input(s): "PROBNP" in the last 8760 hours.  Radiological Exams: DG Abd 1 View  Result Date: 04/13/2022 CLINICAL DATA:  51 year old male with persistent ileus. EXAM: ABDOMEN - 1 VIEW COMPARISON:  04/10/2022 and earlier. FINDINGS: Portable AP supine view at 0609 hours. Bowel-gas pattern has not significantly changed since last month, CT Abdomen and Pelvis 03/28/2022. Gas-filled large and small bowel loops throughout the visible abdomen and pelvis. Percutaneous gastrostomy tube and pelvic phleboliths redemonstrated. No pneumoperitoneum evident on this supine view. Stable visualized osseous structures. IMPRESSION: Ongoing ileus bowel gas pattern, not significantly changed from last month. Electronically Signed   By: Genevie Ann M.D.   On: 04/13/2022 07:34    Assessment/Plan Active Problems:   ALS (amyotrophic lateral sclerosis) (HCC)   Septic shock (HCC)   Pseudomonas pneumonia (HCC)   Autonomic dysfunction   Acute on chronic respiratory failure with hypoxia (HCC)   Acute on chronic respiratory failure hypoxia plan is to continue with full support on the ventilator in the meantime also do home training ALS advanced severe disease continue care Pseudomonas pneumonia has been treated with antibiotics follow along Autonomic dysfunction no change noted overall Sepsis with shock treated resolved   I have personally seen and evaluated the patient, evaluated laboratory and imaging results,  formulated the assessment and plan and placed orders. The Patient requires high complexity decision making with multiple systems involvement.  Rounds were done with the Respiratory Therapy Director and Staff therapists and discussed with nursing staff also.  Allyne Gee, MD [facility] Pulmonary Critical Care Medicine Sleep Medicine

## 2022-04-14 ENCOUNTER — Ambulatory Visit: Payer: Self-pay

## 2022-04-14 ENCOUNTER — Telehealth: Payer: Self-pay

## 2022-04-14 DIAGNOSIS — R6521 Severe sepsis with septic shock: Secondary | ICD-10-CM | POA: Diagnosis not present

## 2022-04-14 DIAGNOSIS — G1221 Amyotrophic lateral sclerosis: Secondary | ICD-10-CM | POA: Diagnosis not present

## 2022-04-14 DIAGNOSIS — A419 Sepsis, unspecified organism: Secondary | ICD-10-CM | POA: Diagnosis not present

## 2022-04-14 DIAGNOSIS — J151 Pneumonia due to Pseudomonas: Secondary | ICD-10-CM | POA: Diagnosis not present

## 2022-04-14 DIAGNOSIS — G909 Disorder of the autonomic nervous system, unspecified: Secondary | ICD-10-CM | POA: Diagnosis not present

## 2022-04-14 DIAGNOSIS — J9621 Acute and chronic respiratory failure with hypoxia: Secondary | ICD-10-CM | POA: Diagnosis not present

## 2022-04-14 LAB — CBC
HCT: 26 % — ABNORMAL LOW (ref 39.0–52.0)
Hemoglobin: 8.6 g/dL — ABNORMAL LOW (ref 13.0–17.0)
MCH: 27.8 pg (ref 26.0–34.0)
MCHC: 33.1 g/dL (ref 30.0–36.0)
MCV: 84.1 fL (ref 80.0–100.0)
Platelets: 163 10*3/uL (ref 150–400)
RBC: 3.09 MIL/uL — ABNORMAL LOW (ref 4.22–5.81)
RDW: 17.2 % — ABNORMAL HIGH (ref 11.5–15.5)
WBC: 7 10*3/uL (ref 4.0–10.5)
nRBC: 0 % (ref 0.0–0.2)

## 2022-04-14 LAB — TRIGLYCERIDES: Triglycerides: 32 mg/dL (ref <150)

## 2022-04-14 LAB — RENAL FUNCTION PANEL
Albumin: 2.8 g/dL — ABNORMAL LOW (ref 3.5–5.0)
Anion gap: 6 (ref 5–15)
BUN: 19 mg/dL (ref 6–20)
CO2: 22 mmol/L (ref 22–32)
Calcium: 8.8 mg/dL — ABNORMAL LOW (ref 8.9–10.3)
Chloride: 108 mmol/L (ref 98–111)
Creatinine, Ser: <0.3 mg/dL — ABNORMAL LOW (ref 0.61–1.24)
Glucose, Bld: 133 mg/dL — ABNORMAL HIGH (ref 70–99)
Phosphorus: 2.6 mg/dL (ref 2.5–4.6)
Potassium: 3.1 mmol/L — ABNORMAL LOW (ref 3.5–5.1)
Sodium: 136 mmol/L (ref 135–145)

## 2022-04-14 LAB — MAGNESIUM: Magnesium: 1.6 mg/dL — ABNORMAL LOW (ref 1.7–2.4)

## 2022-04-14 NOTE — Progress Notes (Signed)
Pulmonary Critical Care Medicine [place]   PULMONARY CRITICAL CARE SERVICE  PROGRESS NOTE     [name]  [id number]:[id number]  DOB: 27-Feb-1971   DOA: 01/08/2022  Referring Physician: Satira Sark, MD  HPI: Jerry [name] is a 51 y.o. male being followed for ventilator/airway/oxygen weaning Acute on Chronic Respiratory Failure.  Remains on the ventilator full support assist control mode  Medications: Reviewed on Rounds  Physical Exam:  Vitals: Temperature is 97.9 pulse 70 respiratory 21 blood pressure 108/60 saturations 98%  Ventilator Settings on assist control FiO2 28% tidal volume 500 PEEP of 5  General: Comfortable at this time Neck: supple Cardiovascular: no malignant arrhythmias Respiratory: Scattered rhonchi expansion is equal Skin: no rash seen on limited exam Musculoskeletal: No gross abnormality Psychiatric:unable to assess Neurologic:no involuntary movements         Lab Data:   Basic Metabolic Panel: Recent Labs  Lab 04/10/22 0135 04/12/22 0313 04/13/22 0252 04/14/22 0427  NA 138 140  --  136  K 3.8 3.6  --  3.1*  CL 107 106  --  108  CO2 25 25  --  22  GLUCOSE 111* 103*  --  133*  BUN 19 19  --  19  CREATININE <0.30* <0.30*  --  <0.30*  CALCIUM 9.2 9.5  --  8.8*  MG 1.9 1.9 1.9 1.6*  PHOS 3.4  --  2.4* 2.6    ABG: No results for input(s): "PHART", "PCO2ART", "PO2ART", "HCO3", "O2SAT" in the last 168 hours.  Liver Function Tests: Recent Labs  Lab 04/14/22 0427  ALBUMIN 2.8*   No results for input(s): "LIPASE", "AMYLASE" in the last 168 hours. No results for input(s): "AMMONIA" in the last 168 hours.  CBC: Recent Labs  Lab 04/10/22 0135 04/14/22 0427  WBC 4.5 7.0  HGB 9.1* 8.6*  HCT 28.6* 26.0*  MCV 86.4 84.1  PLT 139* 163    Cardiac Enzymes: No results for input(s): "CKTOTAL", "CKMB", "CKMBINDEX", "TROPONINI" in the last 168 hours.  BNP (last 3 results) No results for input(s): "BNP" in the last 8760  hours.  ProBNP (last 3 results) No results for input(s): "PROBNP" in the last 8760 hours.  Radiological Exams: VAS [place] UPPER EXTREMITY VENOUS DUPLEX  Result Date: 04/13/2022 UPPER VENOUS STUDY  Patient Name:  Jerry [name]  Date of Exam:   04/13/2022 Medical Rec #: [id number]     Accession #:    [id number] Date of Birth: 1970/12/05     Patient Gender: M Patient Age:   71 years Exam Location:  [facility] Procedure:      VAS [place] UPPER EXTREMITY VENOUS DUPLEX Referring Phys: Janora Norlander --------------------------------------------------------------------------------  Indications: Swelling Comparison Study: 02/12/22 Performing Technologist: Archie Patten RVS  Examination Guidelines: A complete evaluation includes B-mode imaging, spectral Doppler, color Doppler, and power Doppler as needed of all accessible portions of each vessel. Bilateral testing is considered an integral part of a complete examination. Limited examinations for reoccurring indications may be performed as noted.  Right Findings: +----------+------------+---------+-----------+----------+---------------------+ RIGHT     CompressiblePhasicitySpontaneousProperties       Summary        +----------+------------+---------+-----------+----------+---------------------+ IJV                                                 not visualized due to  trach         +----------+------------+---------+-----------+----------+---------------------+ Subclavian    Full       Yes       Yes                                    +----------+------------+---------+-----------+----------+---------------------+ Axillary      Full       Yes       Yes                                    +----------+------------+---------+-----------+----------+---------------------+ Brachial      Full       Yes       Yes                                     +----------+------------+---------+-----------+----------+---------------------+ Radial        Full                                                        +----------+------------+---------+-----------+----------+---------------------+ Ulnar         Full                                                        +----------+------------+---------+-----------+----------+---------------------+ Cephalic      Full                                                        +----------+------------+---------+-----------+----------+---------------------+ Basilic       Full                                                        +----------+------------+---------+-----------+----------+---------------------+  Left Findings: +----------+------------+---------+-----------+----------+-------+ LEFT      CompressiblePhasicitySpontaneousPropertiesSummary +----------+------------+---------+-----------+----------+-------+ Subclavian    Full       Yes       Yes                      +----------+------------+---------+-----------+----------+-------+  Summary:  Right: No evidence of deep vein thrombosis in the upper extremity. No evidence of superficial vein thrombosis in the upper extremity.  Left: No evidence of thrombosis in the subclavian.  *See table(s) above for measurements and observations.  Diagnosing physician: Deitra Mayo MD Electronically signed by Deitra Mayo MD on 04/13/2022 at 6:20:30 PM.    Final    DG Abd 1 View  Result Date: 04/13/2022 CLINICAL DATA:  51 year old male with persistent ileus. EXAM: ABDOMEN - 1 VIEW COMPARISON:  04/10/2022 and earlier. FINDINGS: Portable AP supine view at 0609 hours. Bowel-gas pattern has not significantly changed since last month, CT  Abdomen and Pelvis 03/28/2022. Gas-filled large and small bowel loops throughout the visible abdomen and pelvis. Percutaneous gastrostomy tube and pelvic phleboliths redemonstrated. No pneumoperitoneum  evident on this supine view. Stable visualized osseous structures. IMPRESSION: Ongoing ileus bowel gas pattern, not significantly changed from last month. Electronically Signed   By: Genevie Ann M.D.   On: 04/13/2022 07:34    Assessment/Plan Active Problems:   ALS (amyotrophic lateral sclerosis) (HCC)   Septic shock (HCC)   Pseudomonas pneumonia (HCC)   Autonomic dysfunction   Acute on chronic respiratory failure with hypoxia (HCC)   Acute on chronic respiratory failure hypoxia we will continue with full support on the ventilator home training underway ALS severe advanced disease Supportive care Severe sepsis and shock resolved Pseudomonas pneumonia has been treated improved Autonomic dysfunction no change we will continue to follow-up   I have personally seen and evaluated the patient, evaluated laboratory and imaging results, formulated the assessment and plan and placed orders. The Patient requires high complexity decision making with multiple systems involvement.  Rounds were done with the Respiratory Therapy Director and Staff therapists and discussed with nursing staff also.  Allyne Gee, MD [facility] Pulmonary Critical Care Medicine Sleep Medicine

## 2022-04-15 DIAGNOSIS — G909 Disorder of the autonomic nervous system, unspecified: Secondary | ICD-10-CM | POA: Diagnosis not present

## 2022-04-15 DIAGNOSIS — R6521 Severe sepsis with septic shock: Secondary | ICD-10-CM | POA: Diagnosis not present

## 2022-04-15 DIAGNOSIS — A419 Sepsis, unspecified organism: Secondary | ICD-10-CM | POA: Diagnosis not present

## 2022-04-15 DIAGNOSIS — G1221 Amyotrophic lateral sclerosis: Secondary | ICD-10-CM | POA: Diagnosis not present

## 2022-04-15 DIAGNOSIS — J9621 Acute and chronic respiratory failure with hypoxia: Secondary | ICD-10-CM | POA: Diagnosis not present

## 2022-04-15 DIAGNOSIS — J151 Pneumonia due to Pseudomonas: Secondary | ICD-10-CM | POA: Diagnosis not present

## 2022-04-15 LAB — MAGNESIUM: Magnesium: 1.9 mg/dL (ref 1.7–2.4)

## 2022-04-15 LAB — POTASSIUM: Potassium: 3.4 mmol/L — ABNORMAL LOW (ref 3.5–5.1)

## 2022-04-15 NOTE — Patient Outreach (Addendum)
  Care Coordination   Follow Up Visit Note    Name: Jarquis Walker MRN: [id number] DOB: 25-Oct-1970  Pavlos Yon is a 51 y.o. year old male who sees [name], MD for primary care. I spoke with  Gaetana Michaelis spouse by phone today.  What matters to the patients health and wellness today?  Assist with Transition    Goals Addressed             This Visit's Progress    Assist with Transition       Care Coordination Interventions: Message received from patient's spouse/caregiver regarding patient's transition. Patient is currently admitted at [facility]. Reports having a discussion with the facilities Social Worker, Maudie Mercury and being informed that plan is to proceed with discharge. Reports being informed that she will need to arrange for another caregiver to assist in the home or plan for transition to another LTAC. Discussed current goal of care. Acknowledges patient's acuity has changed and he will require a higher level of care. Reports patient has experienced several complications with pneumonia and developing an ileus since being admitted at [facility]. Reports patient's desire is to continue all life-saving measures and procedures. However, she is currently unable to provide the continuous care that he will require if discharged home. Currently she does not have additional support in the home. Will follow up with the [facility] team this week to discuss.        SDOH assessments and interventions completed:  No     Care Coordination Interventions Activated:  Yes  Care Coordination Interventions:  Yes, provided   Follow up plan:  Will follow up this week.    Encounter Outcome:  Pt. Visit Completed    [facility] [phone]

## 2022-04-15 NOTE — Progress Notes (Signed)
Pulmonary Critical Care Medicine [place]   PULMONARY CRITICAL CARE SERVICE  PROGRESS NOTE     Jerry [name]  [id number]:[id number]  DOB: [birth year]   DOA: 01/08/2022  Referring Physician: Satira Sark, MD  HPI: Jerry [name] is a 51 y.o. male being followed for ventilator/airway/oxygen weaning Acute on Chronic Respiratory Failure.  Patient currently is on assist control mode has been on 28% FiO2 with good saturations  Medications: Reviewed on Rounds  Physical Exam:  Vitals: Temperature is 97.8 pulse 69 respiratory 28 blood pressure is 103/65 saturations 98%  Ventilator Settings: Assist-control FiO2 is 28% PEEP 5  General: Comfortable at this time Neck: supple Cardiovascular: no malignant arrhythmias Respiratory: Scattered rhonchi expansion is equal Skin: no rash seen on limited exam Musculoskeletal: No gross abnormality Psychiatric:unable to assess Neurologic:no involuntary movements         Lab Data:   Basic Metabolic Panel: Recent Labs  Lab 04/10/22 0135 04/12/22 0313 04/13/22 0252 04/14/22 0427 04/15/22 0127  NA 138 140  --  136  --   K 3.8 3.6  --  3.1* 3.4*  CL 107 106  --  108  --   CO2 25 25  --  22  --   GLUCOSE 111* 103*  --  133*  --   BUN 19 19  --  19  --   CREATININE <0.30* <0.30*  --  <0.30*  --   CALCIUM 9.2 9.5  --  8.8*  --   MG 1.9 1.9 1.9 1.6* 1.9  PHOS 3.4  --  2.4* 2.6  --     ABG: No results for input(s): "PHART", "PCO2ART", "PO2ART", "HCO3", "O2SAT" in the last 168 hours.  Liver Function Tests: Recent Labs  Lab 04/14/22 0427  ALBUMIN 2.8*   No results for input(s): "LIPASE", "AMYLASE" in the last 168 hours. No results for input(s): "AMMONIA" in the last 168 hours.  CBC: Recent Labs  Lab 04/10/22 0135 04/14/22 0427  WBC 4.5 7.0  HGB 9.1* 8.6*  HCT 28.6* 26.0*  MCV 86.4 84.1  PLT 139* 163    Cardiac Enzymes: No results for input(s): "CKTOTAL", "CKMB", "CKMBINDEX", "TROPONINI" in the last 168  hours.  BNP (last 3 results) No results for input(s): "BNP" in the last 8760 hours.  ProBNP (last 3 results) No results for input(s): "PROBNP" in the last 8760 hours.  Radiological Exams: VAS [place] UPPER EXTREMITY VENOUS DUPLEX  Result Date: 04/13/2022 UPPER VENOUS STUDY  Patient Name:  Jerry [name]  Date of Exam:   04/13/2022 Medical Rec #: [id number]     Accession #:    [id number] Date of Birth: 19-Jun-1971     Patient Gender: M Patient Age:   48 years Exam Location:  [facility] Procedure:      VAS [place] UPPER EXTREMITY VENOUS DUPLEX Referring Phys: Janora Norlander --------------------------------------------------------------------------------  Indications: Swelling Comparison Study: 02/12/22 Performing Technologist: Archie Patten RVS  Examination Guidelines: A complete evaluation includes B-mode imaging, spectral Doppler, color Doppler, and power Doppler as needed of all accessible portions of each vessel. Bilateral testing is considered an integral part of a complete examination. Limited examinations for reoccurring indications may be performed as noted.  Right Findings: +----------+------------+---------+-----------+----------+---------------------+ RIGHT     CompressiblePhasicitySpontaneousProperties       Summary        +----------+------------+---------+-----------+----------+---------------------+ IJV  not visualized due to                                                             trach         +----------+------------+---------+-----------+----------+---------------------+ Subclavian    Full       Yes       Yes                                    +----------+------------+---------+-----------+----------+---------------------+ Axillary      Full       Yes       Yes                                    +----------+------------+---------+-----------+----------+---------------------+ Brachial      Full       Yes        Yes                                    +----------+------------+---------+-----------+----------+---------------------+ Radial        Full                                                        +----------+------------+---------+-----------+----------+---------------------+ Ulnar         Full                                                        +----------+------------+---------+-----------+----------+---------------------+ Cephalic      Full                                                        +----------+------------+---------+-----------+----------+---------------------+ Basilic       Full                                                        +----------+------------+---------+-----------+----------+---------------------+  Left Findings: +----------+------------+---------+-----------+----------+-------+ LEFT      CompressiblePhasicitySpontaneousPropertiesSummary +----------+------------+---------+-----------+----------+-------+ Subclavian    Full       Yes       Yes                      +----------+------------+---------+-----------+----------+-------+  Summary:  Right: No evidence of deep vein thrombosis in the upper extremity. No evidence of superficial vein thrombosis in the upper extremity.  Left: No evidence of thrombosis in the subclavian.  *See table(s) above for measurements and observations.  Diagnosing physician: Harrell Gave  Scot Dock MD Electronically signed by Deitra Mayo MD on 04/13/2022 at 6:20:30 PM.    Final     Assessment/Plan Active Problems:   ALS (amyotrophic lateral sclerosis) ([place])   Septic shock (HCC)   Pseudomonas pneumonia ([place])   Autonomic dysfunction   Acute on chronic respiratory failure with hypoxia (HCC)   Acute on chronic respiratory failure hypoxia we will continue with full support on the ventilator home training underway ALS advanced severe disease continue with supportive care Pseudomonas pneumonia has  been treated we will continue to monitor. Autonomic dysfunction no change we will continue with supportive care. Sepsis/shock no change hemodynamics are stable   I have personally seen and evaluated the patient, evaluated laboratory and imaging results, formulated the assessment and plan and placed orders. The Patient requires high complexity decision making with multiple systems involvement.  Rounds were done with the Respiratory Therapy Director and Staff therapists and discussed with nursing staff also.  Allyne Gee, MD [facility] Pulmonary Critical Care Medicine Sleep Medicine

## 2022-04-16 ENCOUNTER — Other Ambulatory Visit (HOSPITAL_COMMUNITY): Payer: Self-pay

## 2022-04-16 ENCOUNTER — Ambulatory Visit: Payer: Self-pay

## 2022-04-16 DIAGNOSIS — R6521 Severe sepsis with septic shock: Secondary | ICD-10-CM | POA: Diagnosis not present

## 2022-04-16 DIAGNOSIS — I878 Other specified disorders of veins: Secondary | ICD-10-CM | POA: Diagnosis not present

## 2022-04-16 DIAGNOSIS — G909 Disorder of the autonomic nervous system, unspecified: Secondary | ICD-10-CM | POA: Diagnosis not present

## 2022-04-16 DIAGNOSIS — G1221 Amyotrophic lateral sclerosis: Secondary | ICD-10-CM | POA: Diagnosis not present

## 2022-04-16 DIAGNOSIS — A419 Sepsis, unspecified organism: Secondary | ICD-10-CM | POA: Diagnosis not present

## 2022-04-16 DIAGNOSIS — J9621 Acute and chronic respiratory failure with hypoxia: Secondary | ICD-10-CM | POA: Diagnosis not present

## 2022-04-16 DIAGNOSIS — K6389 Other specified diseases of intestine: Secondary | ICD-10-CM | POA: Diagnosis not present

## 2022-04-16 DIAGNOSIS — J151 Pneumonia due to Pseudomonas: Secondary | ICD-10-CM | POA: Diagnosis not present

## 2022-04-16 LAB — POTASSIUM
Potassium: 3.4 mmol/L — ABNORMAL LOW (ref 3.5–5.1)
Potassium: 3.7 mmol/L (ref 3.5–5.1)

## 2022-04-16 LAB — MAGNESIUM
Magnesium: 1.7 mg/dL (ref 1.7–2.4)
Magnesium: 2.1 mg/dL (ref 1.7–2.4)

## 2022-04-16 LAB — PHOSPHORUS: Phosphorus: 2.4 mg/dL — ABNORMAL LOW (ref 2.5–4.6)

## 2022-04-16 NOTE — Patient Outreach (Signed)
  Care Coordination   Follow Up Visit Note   04/16/2022 Name: Jerry [name] MRN: [id number] DOB: [birth year]  Jerry [name] is a 51 y.o. year old male who sees [name], MD for primary care. I spoke with  [name] 's spouse by phone today.  What matters to the patients health and wellness today?  Assist with Transition    Goals Addressed             This Visit's Progress    Assist with Transition       Care Coordination Interventions: Additional message received from patient's spouse requesting conference call with [facility] Social Worker, Maudie Mercury. Reports being unsure if a nurse case manager is assigned.  Conducted outreach with [place] to discuss spouse's concerns r/t transition. Agreed to relay contact information to the case manager assigned to Mr. Ishee's case to further discuss. Anticipate outreach with Mr. Heidelberger spouse and the assigned team at [facility] later today.        SDOH assessments and interventions completed:  No     Care Coordination Interventions Activated:  Yes  Care Coordination Interventions:  Yes, provided   Follow up plan:  Pending call from [facility]  case manager.    Encounter Outcome:  Pt. Visit Completed    [facility] [phone]

## 2022-04-16 NOTE — Progress Notes (Signed)
Pulmonary Critical Care Medicine [place]   PULMONARY CRITICAL CARE SERVICE  PROGRESS NOTE     Jerry [name]  SJG:[id number]  DOB: 08/07/1970   DOA: 01/08/2022  Referring Physician: Satira Sark, MD  HPI: Jerry [name] is a 51 y.o. male being followed for ventilator/airway/oxygen weaning Acute on Chronic Respiratory Failure.  Patient is on the ventilator full support assist control mode currently on 28% FiO2  Medications: Reviewed on Rounds  Physical Exam:  Vitals: Temperature is 98.0 pulse 63 respiratory 21 blood pressure is 113/66 saturations 100%  Ventilator Settings assist-control FiO2 28% PEEP 5  General: Comfortable at this time Neck: supple Cardiovascular: no malignant arrhythmias Respiratory: No rhonchi very coarse breath sounds Skin: no rash seen on limited exam Musculoskeletal: No gross abnormality Psychiatric:unable to assess Neurologic:no involuntary movements         Lab Data:   Basic Metabolic Panel: Recent Labs  Lab 04/10/22 0135 04/12/22 0313 04/13/22 0252 04/14/22 0427 04/15/22 0127 04/16/22 0309  NA 138 140  --  136  --   --   K 3.8 3.6  --  3.1* 3.4* 3.4*  CL 107 106  --  108  --   --   CO2 25 25  --  22  --   --   GLUCOSE 111* 103*  --  133*  --   --   BUN 19 19  --  19  --   --   CREATININE <0.30* <0.30*  --  <0.30*  --   --   CALCIUM 9.2 9.5  --  8.8*  --   --   MG 1.9 1.9 1.9 1.6* 1.9 1.7  PHOS 3.4  --  2.4* 2.6  --  2.4*    ABG: No results for input(s): "PHART", "PCO2ART", "PO2ART", "HCO3", "O2SAT" in the last 168 hours.  Liver Function Tests: Recent Labs  Lab 04/14/22 0427  ALBUMIN 2.8*   No results for input(s): "LIPASE", "AMYLASE" in the last 168 hours. No results for input(s): "AMMONIA" in the last 168 hours.  CBC: Recent Labs  Lab 04/10/22 0135 04/14/22 0427  WBC 4.5 7.0  HGB 9.1* 8.6*  HCT 28.6* 26.0*  MCV 86.4 84.1  PLT 139* 163    Cardiac Enzymes: No results for input(s):  "CKTOTAL", "CKMB", "CKMBINDEX", "TROPONINI" in the last 168 hours.  BNP (last 3 results) No results for input(s): "BNP" in the last 8760 hours.  ProBNP (last 3 results) No results for input(s): "PROBNP" in the last 8760 hours.  Radiological Exams: No results found.  Assessment/Plan Active Problems:   ALS (amyotrophic lateral sclerosis) (HCC)   Septic shock (HCC)   Pseudomonas pneumonia (HCC)   Autonomic dysfunction   Acute on chronic respiratory failure with hypoxia (HCC)   Acute on chronic respiratory failure hypoxia we will continue with full support on the ventilator patient needs support ALS severe disease we will continue to follow along closely Sepsis with shock resolved hemodynamics are stable Pseudomonas pneumonia has been treated improving clinically Autonomic dysfunction no change   I have personally seen and evaluated the patient, evaluated laboratory and imaging results, formulated the assessment and plan and placed orders. The Patient requires high complexity decision making with multiple systems involvement.  Rounds were done with the Respiratory Therapy Director and Staff therapists and discussed with nursing staff also.  Allyne Gee, MD [facility] Pulmonary Critical Care Medicine Sleep Medicine

## 2022-04-17 DIAGNOSIS — R6521 Severe sepsis with septic shock: Secondary | ICD-10-CM | POA: Diagnosis not present

## 2022-04-17 DIAGNOSIS — A419 Sepsis, unspecified organism: Secondary | ICD-10-CM | POA: Diagnosis not present

## 2022-04-17 DIAGNOSIS — J9621 Acute and chronic respiratory failure with hypoxia: Secondary | ICD-10-CM | POA: Diagnosis not present

## 2022-04-17 DIAGNOSIS — G1221 Amyotrophic lateral sclerosis: Secondary | ICD-10-CM | POA: Diagnosis not present

## 2022-04-17 DIAGNOSIS — J151 Pneumonia due to Pseudomonas: Secondary | ICD-10-CM | POA: Diagnosis not present

## 2022-04-17 DIAGNOSIS — G909 Disorder of the autonomic nervous system, unspecified: Secondary | ICD-10-CM | POA: Diagnosis not present

## 2022-04-17 NOTE — Progress Notes (Signed)
Pulmonary Critical Care Medicine [place]   PULMONARY CRITICAL CARE SERVICE  PROGRESS NOTE     Deloy Archey  KDT:[id number]  DOB: 28-Jul-1970   DOA: 01/08/2022  Referring Physician: Satira Sark, MD  HPI: Adrean Heitz is a 51 y.o. male being followed for ventilator/airway/oxygen weaning Acute on Chronic Respiratory Failure.  Patient is currently on the ventilator full support  Medications: Reviewed on Rounds  Physical Exam:  Vitals: Temperature is 98.7 pulse 67 respiratory 20 blood pressure 107/77 saturations 97%  Ventilator Settings assist-control FiO2 is 28% tidal volume 500 PEEP 5  General: Comfortable at this time Neck: supple Cardiovascular: no malignant arrhythmias Respiratory: No rhonchi no rales noted at this time Skin: no rash seen on limited exam Musculoskeletal: No gross abnormality Psychiatric:unable to assess Neurologic:no involuntary movements         Lab Data:   Basic Metabolic Panel: Recent Labs  Lab 04/12/22 0313 04/13/22 0252 04/14/22 0427 04/15/22 0127 04/16/22 0309 04/16/22 2022  NA 140  --  136  --   --   --   K 3.6  --  3.1* 3.4* 3.4* 3.7  CL 106  --  108  --   --   --   CO2 25  --  22  --   --   --   GLUCOSE 103*  --  133*  --   --   --   BUN 19  --  19  --   --   --   CREATININE <0.30*  --  <0.30*  --   --   --   CALCIUM 9.5  --  8.8*  --   --   --   MG 1.9 1.9 1.6* 1.9 1.7 2.1  PHOS  --  2.4* 2.6  --  2.4*  --     ABG: No results for input(s): "PHART", "PCO2ART", "PO2ART", "HCO3", "O2SAT" in the last 168 hours.  Liver Function Tests: Recent Labs  Lab 04/14/22 0427  ALBUMIN 2.8*   No results for input(s): "LIPASE", "AMYLASE" in the last 168 hours. No results for input(s): "AMMONIA" in the last 168 hours.  CBC: Recent Labs  Lab 04/14/22 0427  WBC 7.0  HGB 8.6*  HCT 26.0*  MCV 84.1  PLT 163    Cardiac Enzymes: No results for input(s): "CKTOTAL", "CKMB", "CKMBINDEX", "TROPONINI" in the last  168 hours.  BNP (last 3 results) No results for input(s): "BNP" in the last 8760 hours.  ProBNP (last 3 results) No results for input(s): "PROBNP" in the last 8760 hours.  Radiological Exams: DG Abd 1 View  Result Date: 04/16/2022 CLINICAL DATA:  Ileus. EXAM: ABDOMEN - 1 VIEW COMPARISON:  Radiographs 04/13/2022 and 04/10/2022.  CT 03/28/2022. FINDINGS: 1255 hours. Supine and semi erect portable views are submitted. Percutaneous gastrostomy appears unchanged. Diffuse small and large bowel dilatation has mildly improved over the last 3 days. No bowel wall thickening is identified. The diaphragm is not included on the erect examination, limiting assessment for pneumoperitoneum. No evidence for that on this limited examination. Pelvic phleboliths are grossly stable. The bones appear unremarkable. IMPRESSION: Mild improvement in diffuse small and large bowel dilatation suggesting improving ileus. No evidence of bowel wall thickening. Electronically Signed   By: Richardean Sale M.D.   On: 04/16/2022 13:14    Assessment/Plan Active Problems:   ALS (amyotrophic lateral sclerosis) (HCC)   Septic shock (HCC)   Pseudomonas pneumonia (HCC)   Autonomic dysfunction   Acute on chronic respiratory failure  with hypoxia ([place])   Acute on chronic respiratory failure hypoxia plan continue with full support on the ventilator currently assist control mode Sepsis with shock supportive care Pseudomonas pneumonia has been treated we will continue to follow along ALS advanced severe disease supportive care Autonomic dysfunction under control at this time   I have personally seen and evaluated the patient, evaluated laboratory and imaging results, formulated the assessment and plan and placed orders. The Patient requires high complexity decision making with multiple systems involvement.  Rounds were done with the Respiratory Therapy Director and Staff therapists and discussed with nursing staff also.  Allyne Gee, MD [facility] Pulmonary Critical Care Medicine Sleep Medicine

## 2022-04-18 DIAGNOSIS — G1221 Amyotrophic lateral sclerosis: Secondary | ICD-10-CM | POA: Diagnosis not present

## 2022-04-18 DIAGNOSIS — R6521 Severe sepsis with septic shock: Secondary | ICD-10-CM | POA: Diagnosis not present

## 2022-04-18 DIAGNOSIS — J9621 Acute and chronic respiratory failure with hypoxia: Secondary | ICD-10-CM | POA: Diagnosis not present

## 2022-04-18 DIAGNOSIS — G909 Disorder of the autonomic nervous system, unspecified: Secondary | ICD-10-CM | POA: Diagnosis not present

## 2022-04-18 DIAGNOSIS — J151 Pneumonia due to Pseudomonas: Secondary | ICD-10-CM | POA: Diagnosis not present

## 2022-04-18 DIAGNOSIS — A419 Sepsis, unspecified organism: Secondary | ICD-10-CM | POA: Diagnosis not present

## 2022-04-18 LAB — CBC
HCT: 28.1 % — ABNORMAL LOW (ref 39.0–52.0)
Hemoglobin: 9 g/dL — ABNORMAL LOW (ref 13.0–17.0)
MCH: 27.4 pg (ref 26.0–34.0)
MCHC: 32 g/dL (ref 30.0–36.0)
MCV: 85.4 fL (ref 80.0–100.0)
Platelets: 159 10*3/uL (ref 150–400)
RBC: 3.29 MIL/uL — ABNORMAL LOW (ref 4.22–5.81)
RDW: 17.1 % — ABNORMAL HIGH (ref 11.5–15.5)
WBC: 5.2 10*3/uL (ref 4.0–10.5)
nRBC: 0 % (ref 0.0–0.2)

## 2022-04-18 LAB — BASIC METABOLIC PANEL
Anion gap: 7 (ref 5–15)
BUN: 16 mg/dL (ref 6–20)
CO2: 23 mmol/L (ref 22–32)
Calcium: 9.2 mg/dL (ref 8.9–10.3)
Chloride: 109 mmol/L (ref 98–111)
Creatinine, Ser: <0.3 mg/dL — ABNORMAL LOW (ref 0.61–1.24)
Glucose, Bld: 121 mg/dL — ABNORMAL HIGH (ref 70–99)
Potassium: 3.3 mmol/L — ABNORMAL LOW (ref 3.5–5.1)
Sodium: 139 mmol/L (ref 135–145)

## 2022-04-18 LAB — MAGNESIUM: Magnesium: 1.7 mg/dL (ref 1.7–2.4)

## 2022-04-18 NOTE — Progress Notes (Signed)
Pulmonary Critical Care Medicine [facility] [place]   PULMONARY CRITICAL CARE SERVICE  PROGRESS NOTE     Jerry [name]  UVO:[id number]  DOB: 1971/04/02   DOA: 01/08/2022  Referring Physician: Luna Kitchens, MD  HPI: Jerry [name] is a 51 y.o. male being followed for ventilator/airway/oxygen weaning Acute on Chronic Respiratory Failure.  Patient is on assist control mode currently on 28% FiO2  Medications: Reviewed on Rounds  Physical Exam:  Vitals: Temperature is 97.5 pulse 56 respiratory 19 blood pressure is 107/54 saturations 100%  Ventilator Settings assist-control FiO2 28% PEEP 5  General: Comfortable at this time Neck: supple Cardiovascular: no malignant arrhythmias Respiratory: No rhonchi no rales are noted Skin: no rash seen on limited exam Musculoskeletal: No gross abnormality Psychiatric:unable to assess Neurologic:no involuntary movements         Lab Data:   Basic Metabolic Panel: Recent Labs  Lab 04/12/22 0313 04/13/22 0252 04/14/22 0427 04/15/22 0127 04/16/22 0309 04/16/22 2022 04/18/22 0210  NA 140  --  136  --   --   --  139  K 3.6  --  3.1* 3.4* 3.4* 3.7 3.3*  CL 106  --  108  --   --   --  109  CO2 25  --  22  --   --   --  23  GLUCOSE 103*  --  133*  --   --   --  121*  BUN 19  --  19  --   --   --  16  CREATININE <0.30*  --  <0.30*  --   --   --  <0.30*  CALCIUM 9.5  --  8.8*  --   --   --  9.2  MG 1.9 1.9 1.6* 1.9 1.7 2.1 1.7  PHOS  --  2.4* 2.6  --  2.4*  --   --     ABG: No results for input(s): "PHART", "PCO2ART", "PO2ART", "HCO3", "O2SAT" in the last 168 hours.  Liver Function Tests: Recent Labs  Lab 04/14/22 0427  ALBUMIN 2.8*   No results for input(s): "LIPASE", "AMYLASE" in the last 168 hours. No results for input(s): "AMMONIA" in the last 168 hours.  CBC: Recent Labs  Lab 04/14/22 0427 04/18/22 0210  WBC 7.0 5.2  HGB 8.6* 9.0*  HCT 26.0* 28.1*  MCV 84.1 85.4  PLT 163 159    Cardiac Enzymes: No  results for input(s): "CKTOTAL", "CKMB", "CKMBINDEX", "TROPONINI" in the last 168 hours.  BNP (last 3 results) No results for input(s): "BNP" in the last 8760 hours.  ProBNP (last 3 results) No results for input(s): "PROBNP" in the last 8760 hours.  Radiological Exams: No results found.  Assessment/Plan Active Problems:   ALS (amyotrophic lateral sclerosis) (HCC)   Septic shock (HCC)   Pseudomonas pneumonia (HCC)   Autonomic dysfunction   Acute on chronic respiratory failure with hypoxia (HCC)   Acute on chronic respiratory failure hypoxia patient is not a candidate for weaning continue on the ventilator full support Severe sepsis with shock hemodynamics are stable Pseudomonas pneumonia has been treated we will continue to monitor Autonomic dysfunction no change ALS severe advanced disease   I have personally seen and evaluated the patient, evaluated laboratory and imaging results, formulated the assessment and plan and placed orders. The Patient requires high complexity decision making with multiple systems involvement.  Rounds were done with the Respiratory Therapy Director and Staff therapists and discussed with nursing staff also.  Yevonne Pax,  MD [facility] Pulmonary Critical Care Medicine Sleep Medicine

## 2022-04-19 ENCOUNTER — Other Ambulatory Visit (HOSPITAL_COMMUNITY): Payer: Self-pay

## 2022-04-19 DIAGNOSIS — G909 Disorder of the autonomic nervous system, unspecified: Secondary | ICD-10-CM | POA: Diagnosis not present

## 2022-04-19 DIAGNOSIS — K6389 Other specified diseases of intestine: Secondary | ICD-10-CM | POA: Diagnosis not present

## 2022-04-19 DIAGNOSIS — R6521 Severe sepsis with septic shock: Secondary | ICD-10-CM | POA: Diagnosis not present

## 2022-04-19 DIAGNOSIS — J9621 Acute and chronic respiratory failure with hypoxia: Secondary | ICD-10-CM | POA: Diagnosis not present

## 2022-04-19 DIAGNOSIS — G1221 Amyotrophic lateral sclerosis: Secondary | ICD-10-CM | POA: Diagnosis not present

## 2022-04-19 DIAGNOSIS — A419 Sepsis, unspecified organism: Secondary | ICD-10-CM | POA: Diagnosis not present

## 2022-04-19 DIAGNOSIS — J151 Pneumonia due to Pseudomonas: Secondary | ICD-10-CM | POA: Diagnosis not present

## 2022-04-19 LAB — PHOSPHORUS: Phosphorus: 2.5 mg/dL (ref 2.5–4.6)

## 2022-04-19 LAB — POTASSIUM: Potassium: 3.7 mmol/L (ref 3.5–5.1)

## 2022-04-19 LAB — MAGNESIUM: Magnesium: 1.8 mg/dL (ref 1.7–2.4)

## 2022-04-19 NOTE — Progress Notes (Signed)
Pulmonary Critical Care Medicine [place]   PULMONARY CRITICAL CARE SERVICE  PROGRESS NOTE     Legrand Lasser  YBW:[id number]  DOB: July 11, 1970   DOA: 01/08/2022  Referring Physician: Satira Sark, MD  HPI: Raymond Bhardwaj is a 51 y.o. male being followed for ventilator/airway/oxygen weaning Acute on Chronic Respiratory Failure.  Patient is on the ventilator full support not weanable  Medications: Reviewed on Rounds  Physical Exam:  Vitals: Temperature 97.5 pulse 70 respiratory 21 blood pressure is 175/60 saturations 100%  Ventilator Settings assist-control FiO2 is 28% tidal volume 500 PEEP 5  General: Comfortable at this time Neck: supple Cardiovascular: no malignant arrhythmias Respiratory: No rhonchi or rales are noted at this time Skin: no rash seen on limited exam Musculoskeletal: No gross abnormality Psychiatric:unable to assess Neurologic:no involuntary movements         Lab Data:   Basic Metabolic Panel: Recent Labs  Lab 04/13/22 0252 04/13/22 0252 04/14/22 0427 04/15/22 0127 04/16/22 0309 04/16/22 2022 04/18/22 0210 04/19/22 0538  NA  --   --  136  --   --   --  139  --   K  --    < > 3.1* 3.4* 3.4* 3.7 3.3* 3.7  CL  --   --  108  --   --   --  109  --   CO2  --   --  22  --   --   --  23  --   GLUCOSE  --   --  133*  --   --   --  121*  --   BUN  --   --  19  --   --   --  16  --   CREATININE  --   --  <0.30*  --   --   --  <0.30*  --   CALCIUM  --   --  8.8*  --   --   --  9.2  --   MG 1.9  --  1.6* 1.9 1.7 2.1 1.7 1.8  PHOS 2.4*  --  2.6  --  2.4*  --   --  2.5   < > = values in this interval not displayed.    ABG: No results for input(s): "PHART", "PCO2ART", "PO2ART", "HCO3", "O2SAT" in the last 168 hours.  Liver Function Tests: Recent Labs  Lab 04/14/22 0427  ALBUMIN 2.8*   No results for input(s): "LIPASE", "AMYLASE" in the last 168 hours. No results for input(s): "AMMONIA" in the last 168 hours.  CBC: Recent  Labs  Lab 04/14/22 0427 04/18/22 0210  WBC 7.0 5.2  HGB 8.6* 9.0*  HCT 26.0* 28.1*  MCV 84.1 85.4  PLT 163 159    Cardiac Enzymes: No results for input(s): "CKTOTAL", "CKMB", "CKMBINDEX", "TROPONINI" in the last 168 hours.  BNP (last 3 results) No results for input(s): "BNP" in the last 8760 hours.  ProBNP (last 3 results) No results for input(s): "PROBNP" in the last 8760 hours.  Radiological Exams: DG Abd 1 View  Result Date: 04/19/2022 CLINICAL DATA:  51 year old male with history of ileus. EXAM: ABDOMEN - 1 VIEW COMPARISON:  04/16/2022. FINDINGS: Gaseous distention is noted throughout the small bowel and colon. Small bowel loops measure up to 4.1 cm in the left upper quadrant. No frank pneumoperitoneum. Distal rectal gas is noted. IMPRESSION: 1. The bowel-gas pattern remains compatible with ileus, as above. No substantial change compared to the recent prior study. Electronically Signed  By: Vinnie Langton M.D.   On: 04/19/2022 06:27    Assessment/Plan Active Problems:   ALS (amyotrophic lateral sclerosis) (HCC)   Septic shock (HCC)   Pseudomonas pneumonia (HCC)   Autonomic dysfunction   Acute on chronic respiratory failure with hypoxia (HCC)   Acute on chronic respiratory failure hypoxia we will continue on assist control mode currently patient is on 28% tidal volume of 500 PEEP 5 patient is at baseline Septic shock treated resolved hemodynamics are stable Pseudomonas pneumonia has been treated with antibiotics Autonomic dysfunction no change we will continue to follow along ALS severe end-stage disease   I have personally seen and evaluated the patient, evaluated laboratory and imaging results, formulated the assessment and plan and placed orders. The Patient requires high complexity decision making with multiple systems involvement.  Rounds were done with the Respiratory Therapy Director and Staff therapists and discussed with nursing staff also.  Allyne Gee,  MD [facility] Pulmonary Critical Care Medicine Sleep Medicine

## 2022-04-20 DIAGNOSIS — G909 Disorder of the autonomic nervous system, unspecified: Secondary | ICD-10-CM | POA: Diagnosis not present

## 2022-04-20 DIAGNOSIS — R6521 Severe sepsis with septic shock: Secondary | ICD-10-CM | POA: Diagnosis not present

## 2022-04-20 DIAGNOSIS — G1221 Amyotrophic lateral sclerosis: Secondary | ICD-10-CM | POA: Diagnosis not present

## 2022-04-20 DIAGNOSIS — A419 Sepsis, unspecified organism: Secondary | ICD-10-CM | POA: Diagnosis not present

## 2022-04-20 DIAGNOSIS — J151 Pneumonia due to Pseudomonas: Secondary | ICD-10-CM | POA: Diagnosis not present

## 2022-04-20 DIAGNOSIS — J9621 Acute and chronic respiratory failure with hypoxia: Secondary | ICD-10-CM | POA: Diagnosis not present

## 2022-04-20 LAB — BASIC METABOLIC PANEL
Anion gap: 8 (ref 5–15)
BUN: 15 mg/dL (ref 6–20)
CO2: 21 mmol/L — ABNORMAL LOW (ref 22–32)
Calcium: 9.7 mg/dL (ref 8.9–10.3)
Chloride: 109 mmol/L (ref 98–111)
Creatinine, Ser: <0.3 mg/dL — ABNORMAL LOW (ref 0.61–1.24)
Glucose, Bld: 112 mg/dL — ABNORMAL HIGH (ref 70–99)
Potassium: 3.7 mmol/L (ref 3.5–5.1)
Sodium: 138 mmol/L (ref 135–145)

## 2022-04-20 LAB — CBC
HCT: 31.2 % — ABNORMAL LOW (ref 39.0–52.0)
Hemoglobin: 9.9 g/dL — ABNORMAL LOW (ref 13.0–17.0)
MCH: 27.3 pg (ref 26.0–34.0)
MCHC: 31.7 g/dL (ref 30.0–36.0)
MCV: 86 fL (ref 80.0–100.0)
Platelets: 171 10*3/uL (ref 150–400)
RBC: 3.63 MIL/uL — ABNORMAL LOW (ref 4.22–5.81)
RDW: 17.2 % — ABNORMAL HIGH (ref 11.5–15.5)
WBC: 5 10*3/uL (ref 4.0–10.5)
nRBC: 0 % (ref 0.0–0.2)

## 2022-04-20 LAB — MAGNESIUM: Magnesium: 1.9 mg/dL (ref 1.7–2.4)

## 2022-04-20 NOTE — Progress Notes (Signed)
Pulmonary Critical Care Medicine [place]   PULMONARY CRITICAL CARE SERVICE  PROGRESS NOTE     Haydan Mansouri  XBJ:[id number]  DOB: [birth year]   DOA: 01/08/2022  Referring Physician: Satira Sark, MD  HPI: Jameson Tormey is a 51 y.o. male being followed for ventilator/airway/oxygen weaning Acute on Chronic Respiratory Failure.  Patient is on the ventilator at baseline on third 28% FiO2  Medications: Reviewed on Rounds  Physical Exam:  Vitals: Temperature is 97.2 pulse 65 respiratory rate is 20 blood pressure is 120/55 saturations 100%  Ventilator Settings on assist control FiO2 is 28% tidal volume 500 PEEP of 5  General: Comfortable at this time Neck: supple Cardiovascular: no malignant arrhythmias Respiratory: Coarse rhonchi expansion is equal Skin: no rash seen on limited exam Musculoskeletal: No gross abnormality Psychiatric:unable to assess Neurologic:no involuntary movements         Lab Data:   Basic Metabolic Panel: Recent Labs  Lab 04/14/22 0427 04/15/22 0127 04/16/22 0309 04/16/22 2022 04/18/22 0210 04/19/22 0538 04/20/22 0134  NA 136  --   --   --  139  --  138  K 3.1*   < > 3.4* 3.7 3.3* 3.7 3.7  CL 108  --   --   --  109  --  109  CO2 22  --   --   --  23  --  21*  GLUCOSE 133*  --   --   --  121*  --  112*  BUN 19  --   --   --  16  --  15  CREATININE <0.30*  --   --   --  <0.30*  --  <0.30*  CALCIUM 8.8*  --   --   --  9.2  --  9.7  MG 1.6*   < > 1.7 2.1 1.7 1.8 1.9  PHOS 2.6  --  2.4*  --   --  2.5  --    < > = values in this interval not displayed.    ABG: No results for input(s): "PHART", "PCO2ART", "PO2ART", "HCO3", "O2SAT" in the last 168 hours.  Liver Function Tests: Recent Labs  Lab 04/14/22 0427  ALBUMIN 2.8*   No results for input(s): "LIPASE", "AMYLASE" in the last 168 hours. No results for input(s): "AMMONIA" in the last 168 hours.  CBC: Recent Labs  Lab 04/14/22 0427 04/18/22 0210 04/20/22 0134   WBC 7.0 5.2 5.0  HGB 8.6* 9.0* 9.9*  HCT 26.0* 28.1* 31.2*  MCV 84.1 85.4 86.0  PLT 163 159 171    Cardiac Enzymes: No results for input(s): "CKTOTAL", "CKMB", "CKMBINDEX", "TROPONINI" in the last 168 hours.  BNP (last 3 results) No results for input(s): "BNP" in the last 8760 hours.  ProBNP (last 3 results) No results for input(s): "PROBNP" in the last 8760 hours.  Radiological Exams: DG Abd 1 View  Result Date: 04/19/2022 CLINICAL DATA:  51 year old male with history of ileus. EXAM: ABDOMEN - 1 VIEW COMPARISON:  04/16/2022. FINDINGS: Gaseous distention is noted throughout the small bowel and colon. Small bowel loops measure up to 4.1 cm in the left upper quadrant. No frank pneumoperitoneum. Distal rectal gas is noted. IMPRESSION: 1. The bowel-gas pattern remains compatible with ileus, as above. No substantial change compared to the recent prior study. Electronically Signed   By: Vinnie Langton M.D.   On: 04/19/2022 06:27    Assessment/Plan Active Problems:   ALS (amyotrophic lateral sclerosis) (HCC)   Septic shock (HCC)  Pseudomonas pneumonia (HCC)   Autonomic dysfunction   Acute on chronic respiratory failure with hypoxia (HCC)   Acute on chronic respiratory failure hypoxia plan is to continue with full support on the ventilator at home training for the family underway. Sepsis shock treated resolved hemodynamics are stable ALS supportive care we will continue to follow along closely Autonomic dysfunction no change we will continue to follow along Pseudomonas pneumonia has been treated with antibiotics clinically improving   I have personally seen and evaluated the patient, evaluated laboratory and imaging results, formulated the assessment and plan and placed orders. The Patient requires high complexity decision making with multiple systems involvement.  Rounds were done with the Respiratory Therapy Director and Staff therapists and discussed with nursing staff  also.  Allyne Gee, MD [facility] Pulmonary Critical Care Medicine Sleep Medicine

## 2022-04-21 DIAGNOSIS — G1221 Amyotrophic lateral sclerosis: Secondary | ICD-10-CM | POA: Diagnosis not present

## 2022-04-21 DIAGNOSIS — J9621 Acute and chronic respiratory failure with hypoxia: Secondary | ICD-10-CM | POA: Diagnosis not present

## 2022-04-21 DIAGNOSIS — A419 Sepsis, unspecified organism: Secondary | ICD-10-CM | POA: Diagnosis not present

## 2022-04-21 DIAGNOSIS — R6521 Severe sepsis with septic shock: Secondary | ICD-10-CM | POA: Diagnosis not present

## 2022-04-21 DIAGNOSIS — J151 Pneumonia due to Pseudomonas: Secondary | ICD-10-CM | POA: Diagnosis not present

## 2022-04-21 DIAGNOSIS — G909 Disorder of the autonomic nervous system, unspecified: Secondary | ICD-10-CM | POA: Diagnosis not present

## 2022-04-21 LAB — TRIGLYCERIDES: Triglycerides: 41 mg/dL (ref <150)

## 2022-04-21 NOTE — Progress Notes (Signed)
Pulmonary Critical Care Medicine [place]   PULMONARY CRITICAL CARE SERVICE  PROGRESS NOTE     Jerry [name]  [id number]  DOB: 03/06/1971   DOA: 01/08/2022  Referring Physician: Satira Sark, MD  HPI: Jerry [name] is a 51 y.o. male being followed for ventilator/airway/oxygen weaning Acute on Chronic Respiratory Failure.  Patient currently is on assist control mode has been on 28% FiO2 saturations are good  Medications: Reviewed on Rounds  Physical Exam:  Vitals: Temperature is 97.2 pulse 63 respiratory 20 blood pressure is 121/77 saturations 100%  Ventilator Settings on assist control FiO2 is 28% tidal volume 500 PEEP of 5  General: Comfortable at this time Neck: supple Cardiovascular: no malignant arrhythmias Respiratory: Scattered rhonchi expansion is equal Skin: no rash seen on limited exam Musculoskeletal: No gross abnormality Psychiatric:unable to assess Neurologic:no involuntary movements         Lab Data:   Basic Metabolic Panel: Recent Labs  Lab 04/16/22 0309 04/16/22 2022 04/18/22 0210 04/19/22 0538 04/20/22 0134  NA  --   --  139  --  138  K 3.4* 3.7 3.3* 3.7 3.7  CL  --   --  109  --  109  CO2  --   --  23  --  21*  GLUCOSE  --   --  121*  --  112*  BUN  --   --  16  --  15  CREATININE  --   --  <0.30*  --  <0.30*  CALCIUM  --   --  9.2  --  9.7  MG 1.7 2.1 1.7 1.8 1.9  PHOS 2.4*  --   --  2.5  --     ABG: No results for input(s): "PHART", "PCO2ART", "PO2ART", "HCO3", "O2SAT" in the last 168 hours.  Liver Function Tests: No results for input(s): "AST", "ALT", "ALKPHOS", "BILITOT", "PROT", "ALBUMIN" in the last 168 hours. No results for input(s): "LIPASE", "AMYLASE" in the last 168 hours. No results for input(s): "AMMONIA" in the last 168 hours.  CBC: Recent Labs  Lab 04/18/22 0210 04/20/22 0134  WBC 5.2 5.0  HGB 9.0* 9.9*  HCT 28.1* 31.2*  MCV 85.4 86.0  PLT 159 171    Cardiac Enzymes: No results for  input(s): "CKTOTAL", "CKMB", "CKMBINDEX", "TROPONINI" in the last 168 hours.  BNP (last 3 results) No results for input(s): "BNP" in the last 8760 hours.  ProBNP (last 3 results) No results for input(s): "PROBNP" in the last 8760 hours.  Radiological Exams: No results found.  Assessment/Plan Active Problems:   ALS (amyotrophic lateral sclerosis) (HCC)   Septic shock (HCC)   Pseudomonas pneumonia (HCC)   Autonomic dysfunction   Acute on chronic respiratory failure with hypoxia (HCC)   Acute on chronic respiratory failure hypoxia we will continue with full support on the ventilator patient is at baseline Sepsis with shock treated resolved hemodynamics are stable ALS advanced severe disease we will continue to follow along Autonomic dysfunction no change we will continue with supportive care Pseudomonas pneumonia has been treated with antibiotics   I have personally seen and evaluated the patient, evaluated laboratory and imaging results, formulated the assessment and plan and placed orders. The Patient requires high complexity decision making with multiple systems involvement.  Rounds were done with the Respiratory Therapy Director and Staff therapists and discussed with nursing staff also.  Allyne Gee, MD [facility] Pulmonary Critical Care Medicine Sleep Medicine

## 2022-04-22 DIAGNOSIS — J151 Pneumonia due to Pseudomonas: Secondary | ICD-10-CM | POA: Diagnosis not present

## 2022-04-22 DIAGNOSIS — J9621 Acute and chronic respiratory failure with hypoxia: Secondary | ICD-10-CM | POA: Diagnosis not present

## 2022-04-22 DIAGNOSIS — R6521 Severe sepsis with septic shock: Secondary | ICD-10-CM | POA: Diagnosis not present

## 2022-04-22 DIAGNOSIS — G1221 Amyotrophic lateral sclerosis: Secondary | ICD-10-CM | POA: Diagnosis not present

## 2022-04-22 DIAGNOSIS — G909 Disorder of the autonomic nervous system, unspecified: Secondary | ICD-10-CM | POA: Diagnosis not present

## 2022-04-22 DIAGNOSIS — A419 Sepsis, unspecified organism: Secondary | ICD-10-CM | POA: Diagnosis not present

## 2022-04-22 LAB — PHOSPHORUS: Phosphorus: 2.9 mg/dL (ref 2.5–4.6)

## 2022-04-22 LAB — MAGNESIUM: Magnesium: 1.7 mg/dL (ref 1.7–2.4)

## 2022-04-23 ENCOUNTER — Other Ambulatory Visit (HOSPITAL_COMMUNITY): Payer: Self-pay

## 2022-04-23 DIAGNOSIS — J151 Pneumonia due to Pseudomonas: Secondary | ICD-10-CM | POA: Diagnosis not present

## 2022-04-23 DIAGNOSIS — G909 Disorder of the autonomic nervous system, unspecified: Secondary | ICD-10-CM | POA: Diagnosis not present

## 2022-04-23 DIAGNOSIS — R6521 Severe sepsis with septic shock: Secondary | ICD-10-CM | POA: Diagnosis not present

## 2022-04-23 DIAGNOSIS — A419 Sepsis, unspecified organism: Secondary | ICD-10-CM | POA: Diagnosis not present

## 2022-04-23 DIAGNOSIS — G1221 Amyotrophic lateral sclerosis: Secondary | ICD-10-CM | POA: Diagnosis not present

## 2022-04-23 DIAGNOSIS — Z8719 Personal history of other diseases of the digestive system: Secondary | ICD-10-CM | POA: Diagnosis not present

## 2022-04-23 DIAGNOSIS — J9621 Acute and chronic respiratory failure with hypoxia: Secondary | ICD-10-CM | POA: Diagnosis not present

## 2022-04-23 LAB — BASIC METABOLIC PANEL
Anion gap: 8 (ref 5–15)
BUN: 17 mg/dL (ref 6–20)
CO2: 23 mmol/L (ref 22–32)
Calcium: 9.3 mg/dL (ref 8.9–10.3)
Chloride: 107 mmol/L (ref 98–111)
Creatinine, Ser: <0.3 mg/dL — ABNORMAL LOW (ref 0.61–1.24)
Glucose, Bld: 117 mg/dL — ABNORMAL HIGH (ref 70–99)
Potassium: 3.1 mmol/L — ABNORMAL LOW (ref 3.5–5.1)
Sodium: 138 mmol/L (ref 135–145)

## 2022-04-23 LAB — MAGNESIUM: Magnesium: 1.8 mg/dL (ref 1.7–2.4)

## 2022-04-23 NOTE — Progress Notes (Addendum)
Pulmonary Critical Care Medicine [place]   PULMONARY CRITICAL CARE SERVICE  PROGRESS NOTE     Edgar Reisz  [id number]  DOB: 1970/11/30   DOA: 01/08/2022  Referring Physician: Satira Sark, MD  HPI: Atharv Barriere is a 51 y.o. male being followed for ventilator/airway/oxygen weaning Acute on Chronic Respiratory Failure.  Patient seen lying in bed, currently remains on his baseline of full support ventilation.  No acute overnight events noted.  Medications: Reviewed on Rounds  Physical Exam:  Vitals: Temp 97.3, pulse 92, respirations 18, BP 96/58, SPO2 100%  Ventilator Settings AC VC, FiO2 28%, tidal volume 500, rate 16, PEEP 5  General: Comfortable at this time Neck: supple Cardiovascular: no malignant arrhythmias Respiratory: Utterly diminished Skin: no rash seen on limited exam Musculoskeletal: No gross abnormality Psychiatric:unable to assess Neurologic:no involuntary movements         Lab Data:   Basic Metabolic Panel: Recent Labs  Lab 04/16/22 2022 04/18/22 0210 04/19/22 0538 04/20/22 0134 04/22/22 0120 04/23/22 0130  NA  --  139  --  138  --  138  K 3.7 3.3* 3.7 3.7  --  3.1*  CL  --  109  --  109  --  107  CO2  --  23  --  21*  --  23  GLUCOSE  --  121*  --  112*  --  117*  BUN  --  16  --  15  --  17  CREATININE  --  <0.30*  --  <0.30*  --  <0.30*  CALCIUM  --  9.2  --  9.7  --  9.3  MG 2.1 1.7 1.8 1.9 1.7 1.8  PHOS  --   --  2.5  --  2.9  --     ABG: No results for input(s): "PHART", "PCO2ART", "PO2ART", "HCO3", "O2SAT" in the last 168 hours.  Liver Function Tests: No results for input(s): "AST", "ALT", "ALKPHOS", "BILITOT", "PROT", "ALBUMIN" in the last 168 hours. No results for input(s): "LIPASE", "AMYLASE" in the last 168 hours. No results for input(s): "AMMONIA" in the last 168 hours.  CBC: Recent Labs  Lab 04/18/22 0210 04/20/22 0134  WBC 5.2 5.0  HGB 9.0* 9.9*  HCT 28.1* 31.2*  MCV 85.4 86.0  PLT 159  171    Cardiac Enzymes: No results for input(s): "CKTOTAL", "CKMB", "CKMBINDEX", "TROPONINI" in the last 168 hours.  BNP (last 3 results) No results for input(s): "BNP" in the last 8760 hours.  ProBNP (last 3 results) No results for input(s): "PROBNP" in the last 8760 hours.  Radiological Exams: DG Abd Portable 1V  Result Date: 04/23/2022 CLINICAL DATA:  51 year old male with history of ileus. EXAM: PORTABLE ABDOMEN - 1 VIEW COMPARISON:  04/19/2022. FINDINGS: There continue to be multiple gas-filled loops of small bowel in the abdomen, most evident in the left mid abdomen, however, there is no further pathologic dilatation of small bowel or colon. Gas and stool are noted throughout the colon and rectum. No pneumoperitoneum. Percutaneous gastrostomy tube with retention balloon projecting over the left upper quadrant of the abdomen. IMPRESSION: 1. Unremarkable bowel gas pattern. Previously noted ileus appears resolved. Electronically Signed   By: Vinnie Langton M.D.   On: 04/23/2022 06:26    Assessment/Plan Active Problems:   ALS (amyotrophic lateral sclerosis) (HCC)   Septic shock (HCC)   Pseudomonas pneumonia (HCC)   Autonomic dysfunction   Acute on chronic respiratory failure with hypoxia (HCC)   Acute on chronic  respiratory failure hypoxia-remains on full support ventilation. Sepsis with shock-has been treated and resolved.  Hemodynamics remained stable. ALS advanced severe disease-continue with supportive care. Autonomic dysfunction no overall change.  Continue supportive care. Pseudomonas pneumonia-has been treated with antibiotics.  I have personally seen and evaluated the patient, evaluated laboratory and imaging results, formulated the assessment and plan and placed orders. The Patient requires high complexity decision making with multiple systems involvement.  Rounds were done with the Respiratory Therapy Director and Staff therapists and discussed with nursing staff  also.  Allyne Gee, MD [facility] Pulmonary Critical Care Medicine Sleep Medicine

## 2022-04-23 NOTE — Progress Notes (Cosign Needed Addendum)
Pulmonary Critical Care Medicine [place]   PULMONARY CRITICAL CARE SERVICE  PROGRESS NOTE     Cardell Rachel  BJS:[id number]  DOB: 17-Oct-1970   DOA: 01/08/2022  Referring Physician: Satira Sark, MD  HPI: Jerry [name] is a 51 y.o. male being followed for ventilator/airway/oxygen weaning Acute on Chronic Respiratory Failure.  Patient seen lying in bed, currently remains on baseline of full support ventilation.    Medications: Reviewed on Rounds  Physical Exam:  Vitals: Temp 99.0, pulse 88, respirations 33, BP 151/57, SPO2 98%  Ventilator Settings AC VC, FiO2 28%, tidal volume 500, rate 16, PEEP 5  General: Comfortable at this time Neck: supple Cardiovascular: no malignant arrhythmias Respiratory: Bilaterally diminished Skin: no rash seen on limited exam Musculoskeletal: No gross abnormality Psychiatric:unable to assess Neurologic:no involuntary movements         Lab Data:   Basic Metabolic Panel: Recent Labs  Lab 04/16/22 2022 04/18/22 0210 04/19/22 0538 04/20/22 0134 04/22/22 0120 04/23/22 0130  NA  --  139  --  138  --  138  K 3.7 3.3* 3.7 3.7  --  3.1*  CL  --  109  --  109  --  107  CO2  --  23  --  21*  --  23  GLUCOSE  --  121*  --  112*  --  117*  BUN  --  16  --  15  --  17  CREATININE  --  <0.30*  --  <0.30*  --  <0.30*  CALCIUM  --  9.2  --  9.7  --  9.3  MG 2.1 1.7 1.8 1.9 1.7 1.8  PHOS  --   --  2.5  --  2.9  --     ABG: No results for input(s): "PHART", "PCO2ART", "PO2ART", "HCO3", "O2SAT" in the last 168 hours.  Liver Function Tests: No results for input(s): "AST", "ALT", "ALKPHOS", "BILITOT", "PROT", "ALBUMIN" in the last 168 hours. No results for input(s): "LIPASE", "AMYLASE" in the last 168 hours. No results for input(s): "AMMONIA" in the last 168 hours.  CBC: Recent Labs  Lab 04/18/22 0210 04/20/22 0134  WBC 5.2 5.0  HGB 9.0* 9.9*  HCT 28.1* 31.2*  MCV 85.4 86.0  PLT 159 171    Cardiac Enzymes: No  results for input(s): "CKTOTAL", "CKMB", "CKMBINDEX", "TROPONINI" in the last 168 hours.  BNP (last 3 results) No results for input(s): "BNP" in the last 8760 hours.  ProBNP (last 3 results) No results for input(s): "PROBNP" in the last 8760 hours.  Radiological Exams: DG Abd Portable 1V  Result Date: 04/23/2022 CLINICAL DATA:  51 year old male with history of ileus. EXAM: PORTABLE ABDOMEN - 1 VIEW COMPARISON:  04/19/2022. FINDINGS: There continue to be multiple gas-filled loops of small bowel in the abdomen, most evident in the left mid abdomen, however, there is no further pathologic dilatation of small bowel or colon. Gas and stool are noted throughout the colon and rectum. No pneumoperitoneum. Percutaneous gastrostomy tube with retention balloon projecting over the left upper quadrant of the abdomen. IMPRESSION: 1. Unremarkable bowel gas pattern. Previously noted ileus appears resolved. Electronically Signed   By: Vinnie Langton M.D.   On: 04/23/2022 06:26    Assessment/Plan Active Problems:   ALS (amyotrophic lateral sclerosis) (HCC)   Septic shock (HCC)   Pseudomonas pneumonia (HCC)   Autonomic dysfunction   Acute on chronic respiratory failure with hypoxia (HCC)   Acute on chronic respiratory failure hypoxia-remains on full  support ventilation. Sepsis with shock-has been treated and resolved.  Hemodynamics remained stable. ALS advanced severe disease-continue with supportive care. Autonomic dysfunction no overall change.  Continue supportive care. Pseudomonas pneumonia-has been treated with antibiotics.   I have personally seen and evaluated the patient, evaluated laboratory and imaging results, formulated the assessment and plan and placed orders. The Patient requires high complexity decision making with multiple systems involvement.  Rounds were done with the Respiratory Therapy Director and Staff therapists and discussed with nursing staff also.  Allyne Gee, MD  [facility] Pulmonary Critical Care Medicine Sleep Medicine

## 2022-04-24 DIAGNOSIS — A419 Sepsis, unspecified organism: Secondary | ICD-10-CM | POA: Diagnosis not present

## 2022-04-24 DIAGNOSIS — G1221 Amyotrophic lateral sclerosis: Secondary | ICD-10-CM | POA: Diagnosis not present

## 2022-04-24 DIAGNOSIS — J9621 Acute and chronic respiratory failure with hypoxia: Secondary | ICD-10-CM | POA: Diagnosis not present

## 2022-04-24 DIAGNOSIS — G909 Disorder of the autonomic nervous system, unspecified: Secondary | ICD-10-CM | POA: Diagnosis not present

## 2022-04-24 DIAGNOSIS — R6521 Severe sepsis with septic shock: Secondary | ICD-10-CM | POA: Diagnosis not present

## 2022-04-24 DIAGNOSIS — J151 Pneumonia due to Pseudomonas: Secondary | ICD-10-CM | POA: Diagnosis not present

## 2022-04-24 LAB — POTASSIUM: Potassium: 4.1 mmol/L (ref 3.5–5.1)

## 2022-04-24 NOTE — Progress Notes (Cosign Needed)
Pulmonary Critical Care Medicine [place]   PULMONARY CRITICAL CARE SERVICE  PROGRESS NOTE     Jerry [name]  YQI:[id number]  DOB: October 03, 1970   DOA: 01/08/2022  Referring Physician: Satira Sark, MD  HPI: Jerry [name] is a 51 y.o. male being followed for ventilator/airway/oxygen weaning Acute on Chronic Respiratory Failure.  Patient seen lying in bed, currently watching movie.  Remains on baseline full support ventilation.  Medications: Reviewed on Rounds  Physical Exam:  Vitals: Temp 98.5, pulse 67, respirations 21, BP 116/68, SPO2 100%  Ventilator Settings AC VC, FiO2 20%, tidal volume 500, rate 16, PEEP 5  General: Comfortable at this time Neck: supple Cardiovascular: no malignant arrhythmias Respiratory: Bilaterally diminished Skin: no rash seen on limited exam Musculoskeletal: No gross abnormality Psychiatric:unable to assess Neurologic:no involuntary movements         Lab Data:   Basic Metabolic Panel: Recent Labs  Lab 04/18/22 0210 04/19/22 0538 04/20/22 0134 04/22/22 0120 04/23/22 0130 04/24/22 0151  NA 139  --  138  --  138  --   K 3.3* 3.7 3.7  --  3.1* 4.1  CL 109  --  109  --  107  --   CO2 23  --  21*  --  23  --   GLUCOSE 121*  --  112*  --  117*  --   BUN 16  --  15  --  17  --   CREATININE <0.30*  --  <0.30*  --  <0.30*  --   CALCIUM 9.2  --  9.7  --  9.3  --   MG 1.7 1.8 1.9 1.7 1.8  --   PHOS  --  2.5  --  2.9  --   --     ABG: No results for input(s): "PHART", "PCO2ART", "PO2ART", "HCO3", "O2SAT" in the last 168 hours.  Liver Function Tests: No results for input(s): "AST", "ALT", "ALKPHOS", "BILITOT", "PROT", "ALBUMIN" in the last 168 hours. No results for input(s): "LIPASE", "AMYLASE" in the last 168 hours. No results for input(s): "AMMONIA" in the last 168 hours.  CBC: Recent Labs  Lab 04/18/22 0210 04/20/22 0134  WBC 5.2 5.0  HGB 9.0* 9.9*  HCT 28.1* 31.2*  MCV 85.4 86.0  PLT 159 171    Cardiac  Enzymes: No results for input(s): "CKTOTAL", "CKMB", "CKMBINDEX", "TROPONINI" in the last 168 hours.  BNP (last 3 results) No results for input(s): "BNP" in the last 8760 hours.  ProBNP (last 3 results) No results for input(s): "PROBNP" in the last 8760 hours.  Radiological Exams: DG Abd Portable 1V  Result Date: 04/23/2022 CLINICAL DATA:  51 year old male with history of ileus. EXAM: PORTABLE ABDOMEN - 1 VIEW COMPARISON:  04/19/2022. FINDINGS: There continue to be multiple gas-filled loops of small bowel in the abdomen, most evident in the left mid abdomen, however, there is no further pathologic dilatation of small bowel or colon. Gas and stool are noted throughout the colon and rectum. No pneumoperitoneum. Percutaneous gastrostomy tube with retention balloon projecting over the left upper quadrant of the abdomen. IMPRESSION: 1. Unremarkable bowel gas pattern. Previously noted ileus appears resolved. Electronically Signed   By: Vinnie Langton M.D.   On: 04/23/2022 06:26    Assessment/Plan Active Problems:   ALS (amyotrophic lateral sclerosis) (HCC)   Septic shock (HCC)   Pseudomonas pneumonia (HCC)   Autonomic dysfunction   Acute on chronic respiratory failure with hypoxia (HCC)  Acute on chronic respiratory failure hypoxia-remains on  full support ventilation. Sepsis with shock-has been treated and resolved.  Hemodynamics remained stable. ALS advanced severe disease-continue with supportive care. Autonomic dysfunction no overall change.  Continue supportive care. Pseudomonas pneumonia-has been treated with antibiotics.   I have personally seen and evaluated the patient, evaluated laboratory and imaging results, formulated the assessment and plan and placed orders. The Patient requires high complexity decision making with multiple systems involvement.  Rounds were done with the Respiratory Therapy Director and Staff therapists and discussed with nursing staff also.  Allyne Gee, MD [facility] Pulmonary Critical Care Medicine Sleep Medicine

## 2022-04-25 DIAGNOSIS — A419 Sepsis, unspecified organism: Secondary | ICD-10-CM | POA: Diagnosis not present

## 2022-04-25 DIAGNOSIS — G1221 Amyotrophic lateral sclerosis: Secondary | ICD-10-CM | POA: Diagnosis not present

## 2022-04-25 DIAGNOSIS — J9621 Acute and chronic respiratory failure with hypoxia: Secondary | ICD-10-CM | POA: Diagnosis not present

## 2022-04-25 DIAGNOSIS — G909 Disorder of the autonomic nervous system, unspecified: Secondary | ICD-10-CM | POA: Diagnosis not present

## 2022-04-25 DIAGNOSIS — J151 Pneumonia due to Pseudomonas: Secondary | ICD-10-CM | POA: Diagnosis not present

## 2022-04-25 DIAGNOSIS — R6521 Severe sepsis with septic shock: Secondary | ICD-10-CM | POA: Diagnosis not present

## 2022-04-25 LAB — PHOSPHORUS: Phosphorus: 3.5 mg/dL (ref 2.5–4.6)

## 2022-04-25 LAB — MAGNESIUM
Magnesium: 1.7 mg/dL (ref 1.7–2.4)
Magnesium: 2.1 mg/dL (ref 1.7–2.4)

## 2022-04-25 NOTE — Progress Notes (Cosign Needed)
Pulmonary Critical Care Medicine [place]   PULMONARY CRITICAL CARE SERVICE  PROGRESS NOTE     Belvin Gauss  JOI:[id number]  DOB: 1971/06/16   DOA: 01/08/2022  Referring Physician: Satira Sark, MD  HPI: Zacary Bauer is a 51 y.o. male being followed for ventilator/airway/oxygen weaning Acute on Chronic Respiratory Failure.  Patient seen lying in bed, currently remains on baseline of full support ventilation.  Medications: Reviewed on Rounds  Physical Exam:  Vitals: Temp 97.3, pulse 83, respirations 18, BP 119/70, SPO2 100%  Ventilator Settings AC VC, FiO2 28%, tidal volume 500, rate 16, PEEP 5  General: Comfortable at this time Neck: supple Cardiovascular: no malignant arrhythmias Respiratory: Bilaterally diminished Skin: no rash seen on limited exam Musculoskeletal: No gross abnormality Psychiatric:unable to assess Neurologic:no involuntary movements         Lab Data:   Basic Metabolic Panel: Recent Labs  Lab 04/19/22 0538 04/20/22 0134 04/22/22 0120 04/23/22 0130 04/24/22 0151 04/25/22 0119  NA  --  138  --  138  --   --   K 3.7 3.7  --  3.1* 4.1  --   CL  --  109  --  107  --   --   CO2  --  21*  --  23  --   --   GLUCOSE  --  112*  --  117*  --   --   BUN  --  15  --  17  --   --   CREATININE  --  <0.30*  --  <0.30*  --   --   CALCIUM  --  9.7  --  9.3  --   --   MG 1.8 1.9 1.7 1.8  --  1.7  PHOS 2.5  --  2.9  --   --  3.5    ABG: No results for input(s): "PHART", "PCO2ART", "PO2ART", "HCO3", "O2SAT" in the last 168 hours.  Liver Function Tests: No results for input(s): "AST", "ALT", "ALKPHOS", "BILITOT", "PROT", "ALBUMIN" in the last 168 hours. No results for input(s): "LIPASE", "AMYLASE" in the last 168 hours. No results for input(s): "AMMONIA" in the last 168 hours.  CBC: Recent Labs  Lab 04/20/22 0134  WBC 5.0  HGB 9.9*  HCT 31.2*  MCV 86.0  PLT 171    Cardiac Enzymes: No results for input(s): "CKTOTAL",  "CKMB", "CKMBINDEX", "TROPONINI" in the last 168 hours.  BNP (last 3 results) No results for input(s): "BNP" in the last 8760 hours.  ProBNP (last 3 results) No results for input(s): "PROBNP" in the last 8760 hours.  Radiological Exams: No results found.  Assessment/Plan Active Problems:   ALS (amyotrophic lateral sclerosis) (HCC)   Septic shock (HCC)   Pseudomonas pneumonia (HCC)   Autonomic dysfunction   Acute on chronic respiratory failure with hypoxia (HCC)  Acute on chronic respiratory failure hypoxia-remains on full support ventilation.  Continue with supportive care. Sepsis with shock-has been treated and resolved.  Hemodynamics remained stable. ALS advanced severe disease-continue with supportive care. Autonomic dysfunction no overall change.  Continue supportive care. Pseudomonas pneumonia-has been treated with antibiotics   I have personally seen and evaluated the patient, evaluated laboratory and imaging results, formulated the assessment and plan and placed orders. The Patient requires high complexity decision making with multiple systems involvement.  Rounds were done with the Respiratory Therapy Director and Staff therapists and discussed with nursing staff also.  Allyne Gee, MD [facility] Pulmonary Critical Care Medicine Sleep Medicine

## 2022-04-26 DIAGNOSIS — R6521 Severe sepsis with septic shock: Secondary | ICD-10-CM | POA: Diagnosis not present

## 2022-04-26 DIAGNOSIS — A419 Sepsis, unspecified organism: Secondary | ICD-10-CM | POA: Diagnosis not present

## 2022-04-26 DIAGNOSIS — G1221 Amyotrophic lateral sclerosis: Secondary | ICD-10-CM | POA: Diagnosis not present

## 2022-04-26 DIAGNOSIS — J151 Pneumonia due to Pseudomonas: Secondary | ICD-10-CM | POA: Diagnosis not present

## 2022-04-26 DIAGNOSIS — J9621 Acute and chronic respiratory failure with hypoxia: Secondary | ICD-10-CM | POA: Diagnosis not present

## 2022-04-26 DIAGNOSIS — G909 Disorder of the autonomic nervous system, unspecified: Secondary | ICD-10-CM | POA: Diagnosis not present

## 2022-04-26 LAB — MAGNESIUM
Magnesium: 2.1 mg/dL (ref 1.7–2.4)
Magnesium: 2 mg/dL (ref 1.7–2.4)

## 2022-04-26 LAB — CBC
HCT: 29 % — ABNORMAL LOW (ref 39.0–52.0)
Hemoglobin: 9.1 g/dL — ABNORMAL LOW (ref 13.0–17.0)
MCH: 26.6 pg (ref 26.0–34.0)
MCHC: 31.4 g/dL (ref 30.0–36.0)
MCV: 84.8 fL (ref 80.0–100.0)
Platelets: 171 10*3/uL (ref 150–400)
RBC: 3.42 MIL/uL — ABNORMAL LOW (ref 4.22–5.81)
RDW: 17.2 % — ABNORMAL HIGH (ref 11.5–15.5)
WBC: 4.1 10*3/uL (ref 4.0–10.5)
nRBC: 0 % (ref 0.0–0.2)

## 2022-04-26 LAB — BASIC METABOLIC PANEL
Anion gap: 10 (ref 5–15)
BUN: 20 mg/dL (ref 6–20)
CO2: 23 mmol/L (ref 22–32)
Calcium: 9.7 mg/dL (ref 8.9–10.3)
Chloride: 105 mmol/L (ref 98–111)
Creatinine, Ser: <0.3 mg/dL — ABNORMAL LOW (ref 0.61–1.24)
Glucose, Bld: 108 mg/dL — ABNORMAL HIGH (ref 70–99)
Potassium: 3.9 mmol/L (ref 3.5–5.1)
Sodium: 138 mmol/L (ref 135–145)

## 2022-04-26 NOTE — Progress Notes (Addendum)
Pulmonary Critical Care Medicine [place]   PULMONARY CRITICAL CARE SERVICE  PROGRESS NOTE     Uzziel Russey  RWE:[id number]  DOB: [birth year]   DOA: 01/08/2022  Referring Physician: Satira Sark, MD  HPI: Jerry [name] is a 51 y.o. male being followed for ventilator/airway/oxygen weaning Acute on Chronic Respiratory Failure.  Patient seen lying in bed, currently remains on baseline full support ventilation.  Medications: Reviewed on Rounds  Physical Exam:  Vitals: Temp 97.4, pulse 78, respirations 22, BP 142/95, SPO2 100%  Ventilator Settings AC VC, FiO2 28%, tidal volume 500, rate 16, PEEP 5    General: Comfortable at this time Neck: supple Cardiovascular: no malignant arrhythmias Respiratory: Bilaterally diminished Skin: no rash seen on limited exam Musculoskeletal: No gross abnormality Psychiatric:unable to assess Neurologic:no involuntary movements         Lab Data:   Basic Metabolic Panel: Recent Labs  Lab 04/20/22 0134 04/22/22 0120 04/23/22 0130 04/24/22 0151 04/25/22 0119 04/25/22 2029 04/26/22 0132 04/26/22 1017  NA 138  --  138  --   --   --  138  --   K 3.7  --  3.1* 4.1  --   --  3.9  --   CL 109  --  107  --   --   --  105  --   CO2 21*  --  23  --   --   --  23  --   GLUCOSE 112*  --  117*  --   --   --  108*  --   BUN 15  --  17  --   --   --  20  --   CREATININE <0.30*  --  <0.30*  --   --   --  <0.30*  --   CALCIUM 9.7  --  9.3  --   --   --  9.7  --   MG 1.9 1.7 1.8  --  1.7 2.1 2.1 2.0  PHOS  --  2.9  --   --  3.5  --   --   --     ABG: No results for input(s): "PHART", "PCO2ART", "PO2ART", "HCO3", "O2SAT" in the last 168 hours.  Liver Function Tests: No results for input(s): "AST", "ALT", "ALKPHOS", "BILITOT", "PROT", "ALBUMIN" in the last 168 hours. No results for input(s): "LIPASE", "AMYLASE" in the last 168 hours. No results for input(s): "AMMONIA" in the last 168 hours.  CBC: Recent Labs  Lab  04/20/22 0134 04/26/22 0132  WBC 5.0 4.1  HGB 9.9* 9.1*  HCT 31.2* 29.0*  MCV 86.0 84.8  PLT 171 171    Cardiac Enzymes: No results for input(s): "CKTOTAL", "CKMB", "CKMBINDEX", "TROPONINI" in the last 168 hours.  BNP (last 3 results) No results for input(s): "BNP" in the last 8760 hours.  ProBNP (last 3 results) No results for input(s): "PROBNP" in the last 8760 hours.  Radiological Exams: No results found.  Assessment/Plan Active Problems:   ALS (amyotrophic lateral sclerosis) (HCC)   Septic shock (HCC)   Pseudomonas pneumonia (HCC)   Autonomic dysfunction   Acute on chronic respiratory failure with hypoxia (HCC)   Acute on chronic respiratory failure hypoxia-remains on full support ventilation.  Continue with supportive care. Sepsis with shock-has been treated and resolved.  Hemodynamics remained stable. ALS advanced severe disease-continue with supportive care. Autonomic dysfunction no overall change.  Continue supportive care. Pseudomonas pneumonia-has been treated with antibiotics   I have personally seen and  evaluated the patient, evaluated laboratory and imaging results, formulated the assessment and plan and placed orders. The Patient requires high complexity decision making with multiple systems involvement.  Rounds were done with the Respiratory Therapy Director and Staff therapists and discussed with nursing staff also.  Allyne Gee, MD [facility] Pulmonary Critical Care Medicine Sleep Medicine

## 2022-04-27 ENCOUNTER — Other Ambulatory Visit (HOSPITAL_COMMUNITY): Payer: Self-pay

## 2022-04-27 ENCOUNTER — Ambulatory Visit: Payer: Self-pay

## 2022-04-27 DIAGNOSIS — A419 Sepsis, unspecified organism: Secondary | ICD-10-CM | POA: Diagnosis not present

## 2022-04-27 DIAGNOSIS — J151 Pneumonia due to Pseudomonas: Secondary | ICD-10-CM | POA: Diagnosis not present

## 2022-04-27 DIAGNOSIS — G1221 Amyotrophic lateral sclerosis: Secondary | ICD-10-CM | POA: Diagnosis not present

## 2022-04-27 DIAGNOSIS — J9621 Acute and chronic respiratory failure with hypoxia: Secondary | ICD-10-CM | POA: Diagnosis not present

## 2022-04-27 DIAGNOSIS — G909 Disorder of the autonomic nervous system, unspecified: Secondary | ICD-10-CM | POA: Diagnosis not present

## 2022-04-27 DIAGNOSIS — K6389 Other specified diseases of intestine: Secondary | ICD-10-CM | POA: Diagnosis not present

## 2022-04-27 DIAGNOSIS — K5939 Other megacolon: Secondary | ICD-10-CM | POA: Diagnosis not present

## 2022-04-27 DIAGNOSIS — R6521 Severe sepsis with septic shock: Secondary | ICD-10-CM | POA: Diagnosis not present

## 2022-04-28 DIAGNOSIS — G1221 Amyotrophic lateral sclerosis: Secondary | ICD-10-CM | POA: Diagnosis not present

## 2022-04-28 DIAGNOSIS — R6521 Severe sepsis with septic shock: Secondary | ICD-10-CM | POA: Diagnosis not present

## 2022-04-28 DIAGNOSIS — G909 Disorder of the autonomic nervous system, unspecified: Secondary | ICD-10-CM | POA: Diagnosis not present

## 2022-04-28 DIAGNOSIS — A419 Sepsis, unspecified organism: Secondary | ICD-10-CM | POA: Diagnosis not present

## 2022-04-28 DIAGNOSIS — J151 Pneumonia due to Pseudomonas: Secondary | ICD-10-CM | POA: Diagnosis not present

## 2022-04-28 DIAGNOSIS — J9621 Acute and chronic respiratory failure with hypoxia: Secondary | ICD-10-CM | POA: Diagnosis not present

## 2022-04-28 LAB — BASIC METABOLIC PANEL
Anion gap: 10 (ref 5–15)
BUN: 18 mg/dL (ref 6–20)
CO2: 22 mmol/L (ref 22–32)
Calcium: 9.7 mg/dL (ref 8.9–10.3)
Chloride: 108 mmol/L (ref 98–111)
Creatinine, Ser: <0.3 mg/dL — ABNORMAL LOW (ref 0.61–1.24)
Glucose, Bld: 94 mg/dL (ref 70–99)
Potassium: 3.7 mmol/L (ref 3.5–5.1)
Sodium: 140 mmol/L (ref 135–145)

## 2022-04-28 LAB — MAGNESIUM: Magnesium: 1.9 mg/dL (ref 1.7–2.4)

## 2022-04-28 LAB — CBC
HCT: 29.8 % — ABNORMAL LOW (ref 39.0–52.0)
Hemoglobin: 9.7 g/dL — ABNORMAL LOW (ref 13.0–17.0)
MCH: 27.2 pg (ref 26.0–34.0)
MCHC: 32.6 g/dL (ref 30.0–36.0)
MCV: 83.7 fL (ref 80.0–100.0)
Platelets: 185 10*3/uL (ref 150–400)
RBC: 3.56 MIL/uL — ABNORMAL LOW (ref 4.22–5.81)
RDW: 17.7 % — ABNORMAL HIGH (ref 11.5–15.5)
WBC: 7.4 10*3/uL (ref 4.0–10.5)
nRBC: 0 % (ref 0.0–0.2)

## 2022-04-28 LAB — PHOSPHORUS: Phosphorus: 3.7 mg/dL (ref 2.5–4.6)

## 2022-04-28 LAB — TRIGLYCERIDES: Triglycerides: 30 mg/dL (ref <150)

## 2022-04-28 NOTE — Progress Notes (Cosign Needed)
Pulmonary Critical Care Medicine [place]   PULMONARY CRITICAL CARE SERVICE  PROGRESS NOTE     Jerry [name]  RCV:[id number]  DOB: 1971-01-14   DOA: 01/08/2022  Referring Physician: Satira Sark, MD  HPI: Jerry [name] is a 51 y.o. male being followed for ventilator/airway/oxygen weaning Acute on Chronic Respiratory Failure.  Patient seen lying in bed, currently remains on baseline full support ventilation.  No overnight events noted.  Medications: Reviewed on Rounds  Physical Exam:  Vitals: Temp 97.5, pulse 71, respirations 17, BP 119/76, SPO2 99%  Ventilator Settings AC VC, FiO2 20%, tidal volume 500, rate 16, PEEP 5  General: Comfortable at this time Neck: supple Cardiovascular: no malignant arrhythmias Respiratory: Bilaterally diminished Skin: no rash seen on limited exam Musculoskeletal: No gross abnormality Psychiatric:unable to assess Neurologic:no involuntary movements         Lab Data:   Basic Metabolic Panel: Recent Labs  Lab 04/22/22 0120 04/23/22 0130 04/24/22 0151 04/25/22 0119 04/25/22 2029 04/26/22 0132 04/26/22 1017 04/28/22 0437  NA  --  138  --   --   --  138  --  140  K  --  3.1* 4.1  --   --  3.9  --  3.7  CL  --  107  --   --   --  105  --  108  CO2  --  23  --   --   --  23  --  22  GLUCOSE  --  117*  --   --   --  108*  --  94  BUN  --  17  --   --   --  20  --  18  CREATININE  --  <0.30*  --   --   --  <0.30*  --  <0.30*  CALCIUM  --  9.3  --   --   --  9.7  --  9.7  MG 1.7 1.8  --  1.7 2.1 2.1 2.0 1.9  PHOS 2.9  --   --  3.5  --   --   --  3.7    ABG: No results for input(s): "PHART", "PCO2ART", "PO2ART", "HCO3", "O2SAT" in the last 168 hours.  Liver Function Tests: No results for input(s): "AST", "ALT", "ALKPHOS", "BILITOT", "PROT", "ALBUMIN" in the last 168 hours. No results for input(s): "LIPASE", "AMYLASE" in the last 168 hours. No results for input(s): "AMMONIA" in the last 168 hours.  CBC: Recent  Labs  Lab 04/26/22 0132 04/28/22 0437  WBC 4.1 7.4  HGB 9.1* 9.7*  HCT 29.0* 29.8*  MCV 84.8 83.7  PLT 171 185    Cardiac Enzymes: No results for input(s): "CKTOTAL", "CKMB", "CKMBINDEX", "TROPONINI" in the last 168 hours.  BNP (last 3 results) No results for input(s): "BNP" in the last 8760 hours.  ProBNP (last 3 results) No results for input(s): "PROBNP" in the last 8760 hours.  Radiological Exams: DG Abd 1 View  Result Date: 04/27/2022 CLINICAL DATA:  Abdominal distension. EXAM: ABDOMEN - 1 VIEW COMPARISON:  04/23/2022 FINDINGS: Interval progression of diffuse small bowel and colonic gaseous dilatation compared to the prior study. Gastrostomy tube again noted. Bones are diffusely demineralized. IMPRESSION: Interval progression of diffuse small bowel and colonic gaseous dilatation. Imaging features are compatible with ileus. Electronically Signed   By: Misty Stanley M.D.   On: 04/27/2022 12:22    Assessment/Plan Active Problems:   ALS (amyotrophic lateral sclerosis) ([place])   Septic shock (HCC)  Pseudomonas pneumonia (HCC)   Autonomic dysfunction   Acute on chronic respiratory failure with hypoxia (HCC)  Acute on chronic respiratory failure hypoxia-remains on full support ventilation.  Continue with supportive care. Sepsis with shock-has been treated and resolved.  Hemodynamics remained stable. ALS advanced severe disease-continue with supportive care. Autonomic dysfunction no overall change.  Continue supportive care. Pseudomonas pneumonia-has been treated with antibiotics   I have personally seen and evaluated the patient, evaluated laboratory and imaging results, formulated the assessment and plan and placed orders. The Patient requires high complexity decision making with multiple systems involvement.  Rounds were done with the Respiratory Therapy Director and Staff therapists and discussed with nursing staff also.  Allyne Gee, MD [facility] Pulmonary Critical Care  Medicine Sleep Medicine

## 2022-04-28 NOTE — Progress Notes (Cosign Needed)
Pulmonary Critical Care Medicine [place]   PULMONARY CRITICAL CARE SERVICE  PROGRESS NOTE     Miliano Cotten  EUM:[id number]  DOB: 1971/06/25   DOA: 01/08/2022  Referring Physician: Satira Sark, MD  HPI: Jerry [name] is a 51 y.o. male being followed for ventilator/airway/oxygen weaning Acute on Chronic Respiratory Failure.  Patient seen lying in bed, currently remains on baseline full support ventilation.  Medications: Reviewed on Rounds  Physical Exam:  Vitals: Temp 97.5, pulse 62, respirations 21, BP 126. , SPO2 100%  Ventilator Settings AC VC, FiO2 28%, tidal volume 500, rate 16, PEEP 5  General: Comfortable at this time Neck: supple Cardiovascular: no malignant arrhythmias Respiratory: Laterally diminished Skin: no rash seen on limited exam Musculoskeletal: No gross abnormality Psychiatric:unable to assess Neurologic:no involuntary movements         Lab Data:   Basic Metabolic Panel: Recent Labs  Lab 04/22/22 0120 04/23/22 0130 04/24/22 0151 04/25/22 0119 04/25/22 2029 04/26/22 0132 04/26/22 1017 04/28/22 0437  NA  --  138  --   --   --  138  --  140  K  --  3.1* 4.1  --   --  3.9  --  3.7  CL  --  107  --   --   --  105  --  108  CO2  --  23  --   --   --  23  --  22  GLUCOSE  --  117*  --   --   --  108*  --  94  BUN  --  17  --   --   --  20  --  18  CREATININE  --  <0.30*  --   --   --  <0.30*  --  <0.30*  CALCIUM  --  9.3  --   --   --  9.7  --  9.7  MG 1.7 1.8  --  1.7 2.1 2.1 2.0 1.9  PHOS 2.9  --   --  3.5  --   --   --  3.7    ABG: No results for input(s): "PHART", "PCO2ART", "PO2ART", "HCO3", "O2SAT" in the last 168 hours.  Liver Function Tests: No results for input(s): "AST", "ALT", "ALKPHOS", "BILITOT", "PROT", "ALBUMIN" in the last 168 hours. No results for input(s): "LIPASE", "AMYLASE" in the last 168 hours. No results for input(s): "AMMONIA" in the last 168 hours.  CBC: Recent Labs  Lab 04/26/22 0132  04/28/22 0437  WBC 4.1 7.4  HGB 9.1* 9.7*  HCT 29.0* 29.8*  MCV 84.8 83.7  PLT 171 185    Cardiac Enzymes: No results for input(s): "CKTOTAL", "CKMB", "CKMBINDEX", "TROPONINI" in the last 168 hours.  BNP (last 3 results) No results for input(s): "BNP" in the last 8760 hours.  ProBNP (last 3 results) No results for input(s): "PROBNP" in the last 8760 hours.  Radiological Exams: DG Abd 1 View  Result Date: 04/27/2022 CLINICAL DATA:  Abdominal distension. EXAM: ABDOMEN - 1 VIEW COMPARISON:  04/23/2022 FINDINGS: Interval progression of diffuse small bowel and colonic gaseous dilatation compared to the prior study. Gastrostomy tube again noted. Bones are diffusely demineralized. IMPRESSION: Interval progression of diffuse small bowel and colonic gaseous dilatation. Imaging features are compatible with ileus. Electronically Signed   By: Misty Stanley M.D.   On: 04/27/2022 12:22    Assessment/Plan Active Problems:   ALS (amyotrophic lateral sclerosis) ([place])   Septic shock (HCC)   Pseudomonas pneumonia (  [place])   Autonomic dysfunction   Acute on chronic respiratory failure with hypoxia (HCC)   Acute on chronic respiratory failure hypoxia-remains on full support ventilation.  Continue with supportive care. Sepsis with shock-has been treated and resolved.  Hemodynamics remained stable. ALS advanced severe disease-continue with supportive care. Autonomic dysfunction no overall change.  Continue supportive care. Pseudomonas pneumonia-has been treated with antibiotics   I have personally seen and evaluated the patient, evaluated laboratory and imaging results, formulated the assessment and plan and placed orders. The Patient requires high complexity decision making with multiple systems involvement.  Rounds were done with the Respiratory Therapy Director and Staff therapists and discussed with nursing staff also.  Allyne Gee, MD [facility] Pulmonary Critical Care Medicine Sleep  Medicine

## 2022-04-29 DIAGNOSIS — R6521 Severe sepsis with septic shock: Secondary | ICD-10-CM | POA: Diagnosis not present

## 2022-04-29 DIAGNOSIS — G1221 Amyotrophic lateral sclerosis: Secondary | ICD-10-CM | POA: Diagnosis not present

## 2022-04-29 DIAGNOSIS — G909 Disorder of the autonomic nervous system, unspecified: Secondary | ICD-10-CM | POA: Diagnosis not present

## 2022-04-29 DIAGNOSIS — J151 Pneumonia due to Pseudomonas: Secondary | ICD-10-CM | POA: Diagnosis not present

## 2022-04-29 DIAGNOSIS — A419 Sepsis, unspecified organism: Secondary | ICD-10-CM | POA: Diagnosis not present

## 2022-04-29 DIAGNOSIS — J9621 Acute and chronic respiratory failure with hypoxia: Secondary | ICD-10-CM | POA: Diagnosis not present

## 2022-04-29 NOTE — Progress Notes (Signed)
Pulmonary Critical Care Medicine [place]   PULMONARY CRITICAL CARE SERVICE  PROGRESS NOTE     Jerry [name]  IOX:[id number]  DOB: August 15, 1970   DOA: 01/08/2022  Referring Physician: Satira Sark, MD  HPI: Jerry [name] is a 51 y.o. male being followed for ventilator/airway/oxygen weaning Acute on Chronic Respiratory Failure.  Patient is on assist control on 28% FiO2 saturations are good  Medications: Reviewed on Rounds  Physical Exam:  Vitals: Temperature is 97 pulse 69 respiratory is 15 blood pressure is 125/70 saturations 99%  Ventilator Settings on full support assist control PEEP 5  General: Comfortable at this time Neck: supple Cardiovascular: no malignant arrhythmias Respiratory: No rhonchi coarse breath sounds Skin: no rash seen on limited exam Musculoskeletal: No gross abnormality Psychiatric:unable to assess Neurologic:no involuntary movements         Lab Data:   Basic Metabolic Panel: Recent Labs  Lab 04/23/22 0130 04/24/22 0151 04/25/22 0119 04/25/22 2029 04/26/22 0132 04/26/22 1017 04/28/22 0437  NA 138  --   --   --  138  --  140  K 3.1* 4.1  --   --  3.9  --  3.7  CL 107  --   --   --  105  --  108  CO2 23  --   --   --  23  --  22  GLUCOSE 117*  --   --   --  108*  --  94  BUN 17  --   --   --  20  --  18  CREATININE <0.30*  --   --   --  <0.30*  --  <0.30*  CALCIUM 9.3  --   --   --  9.7  --  9.7  MG 1.8  --  1.7 2.1 2.1 2.0 1.9  PHOS  --   --  3.5  --   --   --  3.7    ABG: No results for input(s): "PHART", "PCO2ART", "PO2ART", "HCO3", "O2SAT" in the last 168 hours.  Liver Function Tests: No results for input(s): "AST", "ALT", "ALKPHOS", "BILITOT", "PROT", "ALBUMIN" in the last 168 hours. No results for input(s): "LIPASE", "AMYLASE" in the last 168 hours. No results for input(s): "AMMONIA" in the last 168 hours.  CBC: Recent Labs  Lab 04/26/22 0132 04/28/22 0437  WBC 4.1 7.4  HGB 9.1* 9.7*  HCT 29.0*  29.8*  MCV 84.8 83.7  PLT 171 185    Cardiac Enzymes: No results for input(s): "CKTOTAL", "CKMB", "CKMBINDEX", "TROPONINI" in the last 168 hours.  BNP (last 3 results) No results for input(s): "BNP" in the last 8760 hours.  ProBNP (last 3 results) No results for input(s): "PROBNP" in the last 8760 hours.  Radiological Exams: DG Abd 1 View  Result Date: 04/27/2022 CLINICAL DATA:  Abdominal distension. EXAM: ABDOMEN - 1 VIEW COMPARISON:  04/23/2022 FINDINGS: Interval progression of diffuse small bowel and colonic gaseous dilatation compared to the prior study. Gastrostomy tube again noted. Bones are diffusely demineralized. IMPRESSION: Interval progression of diffuse small bowel and colonic gaseous dilatation. Imaging features are compatible with ileus. Electronically Signed   By: Misty Stanley M.D.   On: 04/27/2022 12:22    Assessment/Plan Active Problems:   ALS (amyotrophic lateral sclerosis) (HCC)   Septic shock (HCC)   Pseudomonas pneumonia (HCC)   Autonomic dysfunction   Acute on chronic respiratory failure with hypoxia (HCC)   Acute on chronic respiratory failure hypoxia we will continue with  full support on mechanical ventilation on current settings.  Patient is at baseline ALS advanced severe disease Sepsis with shock we will continue to follow along closely Pseudomonas pneumonia has been treated we will continue to follow along closely Autonomic dysfunction at baseline   I have personally seen and evaluated the patient, evaluated laboratory and imaging results, formulated the assessment and plan and placed orders. The Patient requires high complexity decision making with multiple systems involvement.  Rounds were done with the Respiratory Therapy Director and Staff therapists and discussed with nursing staff also.  Allyne Gee, MD [facility] Pulmonary Critical Care Medicine Sleep Medicine

## 2022-04-30 DIAGNOSIS — G1221 Amyotrophic lateral sclerosis: Secondary | ICD-10-CM | POA: Diagnosis not present

## 2022-04-30 DIAGNOSIS — G909 Disorder of the autonomic nervous system, unspecified: Secondary | ICD-10-CM | POA: Diagnosis not present

## 2022-04-30 DIAGNOSIS — J151 Pneumonia due to Pseudomonas: Secondary | ICD-10-CM | POA: Diagnosis not present

## 2022-04-30 DIAGNOSIS — A419 Sepsis, unspecified organism: Secondary | ICD-10-CM | POA: Diagnosis not present

## 2022-04-30 DIAGNOSIS — J9621 Acute and chronic respiratory failure with hypoxia: Secondary | ICD-10-CM | POA: Diagnosis not present

## 2022-04-30 DIAGNOSIS — R6521 Severe sepsis with septic shock: Secondary | ICD-10-CM | POA: Diagnosis not present

## 2022-04-30 NOTE — Progress Notes (Signed)
Pulmonary Critical Care Medicine [place]   PULMONARY CRITICAL CARE SERVICE  PROGRESS NOTE     Jerry [name]  POE:[id number]  DOB: 1971/01/04   DOA: 01/08/2022  Referring Physician: Satira Sark, MD  HPI: Jerry [name] is a 51 y.o. male being followed for ventilator/airway/oxygen weaning Acute on Chronic Respiratory Failure.  Patient remains on the ventilator full support currently on assist-control mode has been on 28% FiO2  Medications: Reviewed on Rounds  Physical Exam:  Vitals: Temperature is 97.5 pulse 62 respiratory 23 blood pressure is 120/68 saturations 100%  Ventilator Settings assist-control FiO2 28% tidal volume 500 PEEP of 5  General: Comfortable at this time Neck: supple Cardiovascular: no malignant arrhythmias Respiratory: No rhonchi very coarse breath sounds Skin: no rash seen on limited exam Musculoskeletal: No gross abnormality Psychiatric:unable to assess Neurologic:no involuntary movements         Lab Data:   Basic Metabolic Panel: Recent Labs  Lab 04/24/22 0151 04/25/22 0119 04/25/22 2029 04/26/22 0132 04/26/22 1017 04/28/22 0437  NA  --   --   --  138  --  140  K 4.1  --   --  3.9  --  3.7  CL  --   --   --  105  --  108  CO2  --   --   --  23  --  22  GLUCOSE  --   --   --  108*  --  94  BUN  --   --   --  20  --  18  CREATININE  --   --   --  <0.30*  --  <0.30*  CALCIUM  --   --   --  9.7  --  9.7  MG  --  1.7 2.1 2.1 2.0 1.9  PHOS  --  3.5  --   --   --  3.7    ABG: No results for input(s): "PHART", "PCO2ART", "PO2ART", "HCO3", "O2SAT" in the last 168 hours.  Liver Function Tests: No results for input(s): "AST", "ALT", "ALKPHOS", "BILITOT", "PROT", "ALBUMIN" in the last 168 hours. No results for input(s): "LIPASE", "AMYLASE" in the last 168 hours. No results for input(s): "AMMONIA" in the last 168 hours.  CBC: Recent Labs  Lab 04/26/22 0132 04/28/22 0437  WBC 4.1 7.4  HGB 9.1* 9.7*  HCT 29.0* 29.8*   MCV 84.8 83.7  PLT 171 185    Cardiac Enzymes: No results for input(s): "CKTOTAL", "CKMB", "CKMBINDEX", "TROPONINI" in the last 168 hours.  BNP (last 3 results) No results for input(s): "BNP" in the last 8760 hours.  ProBNP (last 3 results) No results for input(s): "PROBNP" in the last 8760 hours.  Radiological Exams: No results found.  Assessment/Plan Active Problems:   ALS (amyotrophic lateral sclerosis) (HCC)   Septic shock (HCC)   Pseudomonas pneumonia (HCC)   Autonomic dysfunction   Acute on chronic respiratory failure with hypoxia (HCC)   Acute on chronic respiratory failure hypoxia plan is going to be to continue with full support patient currently is on assist control patient is not a candidate for weaning we will continue to monitor ALS supportive care we will continue to follow along closely. Pseudomonas pneumonia has been treated with antibiotics Autonomic dysfunction no change we will continue with supportive care Sepsis with shock resolved hemodynamics are stable   I have personally seen and evaluated the patient, evaluated laboratory and imaging results, formulated the assessment and plan and placed orders. The  Patient requires high complexity decision making with multiple systems involvement.  Rounds were done with the Respiratory Therapy Director and Staff therapists and discussed with nursing staff also.  Allyne Gee, MD [facility] Pulmonary Critical Care Medicine Sleep Medicine

## 2022-05-01 ENCOUNTER — Other Ambulatory Visit (HOSPITAL_COMMUNITY): Payer: Self-pay

## 2022-05-01 DIAGNOSIS — A419 Sepsis, unspecified organism: Secondary | ICD-10-CM | POA: Diagnosis not present

## 2022-05-01 DIAGNOSIS — G1221 Amyotrophic lateral sclerosis: Secondary | ICD-10-CM | POA: Diagnosis not present

## 2022-05-01 DIAGNOSIS — R6521 Severe sepsis with septic shock: Secondary | ICD-10-CM | POA: Diagnosis not present

## 2022-05-01 DIAGNOSIS — J9621 Acute and chronic respiratory failure with hypoxia: Secondary | ICD-10-CM | POA: Diagnosis not present

## 2022-05-01 DIAGNOSIS — G909 Disorder of the autonomic nervous system, unspecified: Secondary | ICD-10-CM | POA: Diagnosis not present

## 2022-05-01 DIAGNOSIS — Z8719 Personal history of other diseases of the digestive system: Secondary | ICD-10-CM | POA: Diagnosis not present

## 2022-05-01 DIAGNOSIS — J151 Pneumonia due to Pseudomonas: Secondary | ICD-10-CM | POA: Diagnosis not present

## 2022-05-01 DIAGNOSIS — Z931 Gastrostomy status: Secondary | ICD-10-CM | POA: Diagnosis not present

## 2022-05-01 LAB — PHOSPHORUS: Phosphorus: 3.4 mg/dL (ref 2.5–4.6)

## 2022-05-01 LAB — MAGNESIUM: Magnesium: 1.9 mg/dL (ref 1.7–2.4)

## 2022-05-02 DIAGNOSIS — G909 Disorder of the autonomic nervous system, unspecified: Secondary | ICD-10-CM | POA: Diagnosis not present

## 2022-05-02 DIAGNOSIS — A419 Sepsis, unspecified organism: Secondary | ICD-10-CM | POA: Diagnosis not present

## 2022-05-02 DIAGNOSIS — J9621 Acute and chronic respiratory failure with hypoxia: Secondary | ICD-10-CM | POA: Diagnosis not present

## 2022-05-02 DIAGNOSIS — J151 Pneumonia due to Pseudomonas: Secondary | ICD-10-CM | POA: Diagnosis not present

## 2022-05-02 DIAGNOSIS — G1221 Amyotrophic lateral sclerosis: Secondary | ICD-10-CM | POA: Diagnosis not present

## 2022-05-02 DIAGNOSIS — R6521 Severe sepsis with septic shock: Secondary | ICD-10-CM | POA: Diagnosis not present

## 2022-05-02 NOTE — Progress Notes (Signed)
Pulmonary Critical Care Medicine [place]   PULMONARY CRITICAL CARE SERVICE  PROGRESS NOTE     Jerry [name]  OVF:[id number]  DOB: 10/20/1970   DOA: 01/08/2022  Referring Physician: Satira Sark, MD  HPI: Jerry [name] is a 51 y.o. male being followed for ventilator/airway/oxygen weaning Acute on Chronic Respiratory Failure.  Patient currently is on assist control mode has been on 28% FiO2 saturations are good  Medications: Reviewed on Rounds  Physical Exam:  Vitals: Temperature is 97.2 pulse of 63 respiratory 25 blood pressure 127/83 saturations 100%  Ventilator Settings on assist control FiO2 is 28% tidal volume 500 PEEP 5  General: Comfortable at this time Neck: supple Cardiovascular: no malignant arrhythmias Respiratory: Scattered rhonchi expansion is equal Skin: no rash seen on limited exam Musculoskeletal: No gross abnormality Psychiatric:unable to assess Neurologic:no involuntary movements         Lab Data:   Basic Metabolic Panel: Recent Labs  Lab 04/25/22 2029 04/26/22 0132 04/26/22 1017 04/28/22 0437 05/01/22 0114  NA  --  138  --  140  --   K  --  3.9  --  3.7  --   CL  --  105  --  108  --   CO2  --  23  --  22  --   GLUCOSE  --  108*  --  94  --   BUN  --  20  --  18  --   CREATININE  --  <0.30*  --  <0.30*  --   CALCIUM  --  9.7  --  9.7  --   MG 2.1 2.1 2.0 1.9 1.9  PHOS  --   --   --  3.7 3.4    ABG: No results for input(s): "PHART", "PCO2ART", "PO2ART", "HCO3", "O2SAT" in the last 168 hours.  Liver Function Tests: No results for input(s): "AST", "ALT", "ALKPHOS", "BILITOT", "PROT", "ALBUMIN" in the last 168 hours. No results for input(s): "LIPASE", "AMYLASE" in the last 168 hours. No results for input(s): "AMMONIA" in the last 168 hours.  CBC: Recent Labs  Lab 04/26/22 0132 04/28/22 0437  WBC 4.1 7.4  HGB 9.1* 9.7*  HCT 29.0* 29.8*  MCV 84.8 83.7  PLT 171 185    Cardiac Enzymes: No results for  input(s): "CKTOTAL", "CKMB", "CKMBINDEX", "TROPONINI" in the last 168 hours.  BNP (last 3 results) No results for input(s): "BNP" in the last 8760 hours.  ProBNP (last 3 results) No results for input(s): "PROBNP" in the last 8760 hours.  Radiological Exams: DG Abd 1 View  Result Date: 05/01/2022 CLINICAL DATA:  Ileus.  Follow-up. EXAM: ABDOMEN - 1 VIEW COMPARISON:  04/27/2022 and older studies.  CT, 03/28/2022. FINDINGS: There is generalized increased bowel gas similar to that seen on the prior exam. There is no significant bowel dilation, however. Gastrostomy tube projects in the left upper abdomen, unchanged. IMPRESSION: 1. Bowel dilation noted on the previous exam has decreased. There is still increased bowel gas diffusely, both in small bowel and colon. Findings suggest improved, but not resolved, adynamic ileus. Electronically Signed   By: Lajean Manes M.D.   On: 05/01/2022 14:04    Assessment/Plan Active Problems:   ALS (amyotrophic lateral sclerosis) (HCC)   Septic shock (HCC)   Pseudomonas pneumonia (HCC)   Autonomic dysfunction   Acute on chronic respiratory failure with hypoxia (HCC)   Acute on chronic respiratory failure hypoxia plan is going to be to continue with full support on the  ventilator patient is at baseline ALS advanced severe disease we will continue to monitor Pseudomonas pneumonia has been treated with antibiotics Autonomic dysfunction no change Sepsis resolved   I have personally seen and evaluated the patient, evaluated laboratory and imaging results, formulated the assessment and plan and placed orders. The Patient requires high complexity decision making with multiple systems involvement.  Rounds were done with the Respiratory Therapy Director and Staff therapists and discussed with nursing staff also.  Allyne Gee, MD [facility] Pulmonary Critical Care Medicine Sleep Medicine

## 2022-05-03 DIAGNOSIS — G909 Disorder of the autonomic nervous system, unspecified: Secondary | ICD-10-CM | POA: Diagnosis not present

## 2022-05-03 DIAGNOSIS — G1221 Amyotrophic lateral sclerosis: Secondary | ICD-10-CM | POA: Diagnosis not present

## 2022-05-03 DIAGNOSIS — J151 Pneumonia due to Pseudomonas: Secondary | ICD-10-CM | POA: Diagnosis not present

## 2022-05-03 DIAGNOSIS — R6521 Severe sepsis with septic shock: Secondary | ICD-10-CM | POA: Diagnosis not present

## 2022-05-03 DIAGNOSIS — A419 Sepsis, unspecified organism: Secondary | ICD-10-CM | POA: Diagnosis not present

## 2022-05-03 DIAGNOSIS — J9621 Acute and chronic respiratory failure with hypoxia: Secondary | ICD-10-CM | POA: Diagnosis not present

## 2022-05-03 LAB — CBC
HCT: 36.7 % — ABNORMAL LOW (ref 39.0–52.0)
Hemoglobin: 11.4 g/dL — ABNORMAL LOW (ref 13.0–17.0)
MCH: 26.6 pg (ref 26.0–34.0)
MCHC: 31.1 g/dL (ref 30.0–36.0)
MCV: 85.7 fL (ref 80.0–100.0)
Platelets: 210 10*3/uL (ref 150–400)
RBC: 4.28 MIL/uL (ref 4.22–5.81)
RDW: 17.5 % — ABNORMAL HIGH (ref 11.5–15.5)
WBC: 9.1 10*3/uL (ref 4.0–10.5)
nRBC: 0 % (ref 0.0–0.2)

## 2022-05-03 LAB — BASIC METABOLIC PANEL
Anion gap: 8 (ref 5–15)
BUN: 15 mg/dL (ref 6–20)
CO2: 21 mmol/L — ABNORMAL LOW (ref 22–32)
Calcium: 9.8 mg/dL (ref 8.9–10.3)
Chloride: 110 mmol/L (ref 98–111)
Creatinine, Ser: <0.3 mg/dL — ABNORMAL LOW (ref 0.61–1.24)
Glucose, Bld: 110 mg/dL — ABNORMAL HIGH (ref 70–99)
Potassium: 3.8 mmol/L (ref 3.5–5.1)
Sodium: 139 mmol/L (ref 135–145)

## 2022-05-03 LAB — MAGNESIUM: Magnesium: 2 mg/dL (ref 1.7–2.4)

## 2022-05-03 NOTE — Progress Notes (Signed)
Pulmonary Critical Care Medicine [place]   PULMONARY CRITICAL CARE SERVICE  PROGRESS NOTE     Jerry [name]  TKP:[id number]  DOB: [birth year]   DOA: 01/08/2022  Referring Physician: Satira Sark, MD  HPI: Jerry [name] is a 51 y.o. male being followed for ventilator/airway/oxygen weaning Acute on Chronic Respiratory Failure.  Patient is on the ventilator on assist control mode at baseline  Medications: Reviewed on Rounds  Physical Exam:  Vitals: Temperature is 98.0 pulse 80 respiratory rate is 30 blood pressure 119/67 saturations 99%  Ventilator Settings assist-control FiO2 is 28% tidal volume 500 PEEP 5  General: Comfortable at this time Neck: supple Cardiovascular: no malignant arrhythmias Respiratory: Scattered rhonchi Skin: no rash seen on limited exam Musculoskeletal: No gross abnormality Psychiatric:unable to assess Neurologic:no involuntary movements         Lab Data:   Basic Metabolic Panel: Recent Labs  Lab 04/26/22 1017 04/28/22 0437 05/01/22 0114 05/03/22 0151  NA  --  140  --  139  K  --  3.7  --  3.8  CL  --  108  --  110  CO2  --  22  --  21*  GLUCOSE  --  94  --  110*  BUN  --  18  --  15  CREATININE  --  <0.30*  --  <0.30*  CALCIUM  --  9.7  --  9.8  MG 2.0 1.9 1.9 2.0  PHOS  --  3.7 3.4  --     ABG: No results for input(s): "PHART", "PCO2ART", "PO2ART", "HCO3", "O2SAT" in the last 168 hours.  Liver Function Tests: No results for input(s): "AST", "ALT", "ALKPHOS", "BILITOT", "PROT", "ALBUMIN" in the last 168 hours. No results for input(s): "LIPASE", "AMYLASE" in the last 168 hours. No results for input(s): "AMMONIA" in the last 168 hours.  CBC: Recent Labs  Lab 04/28/22 0437 05/03/22 0151  WBC 7.4 9.1  HGB 9.7* 11.4*  HCT 29.8* 36.7*  MCV 83.7 85.7  PLT 185 210    Cardiac Enzymes: No results for input(s): "CKTOTAL", "CKMB", "CKMBINDEX", "TROPONINI" in the last 168 hours.  BNP (last 3 results) No  results for input(s): "BNP" in the last 8760 hours.  ProBNP (last 3 results) No results for input(s): "PROBNP" in the last 8760 hours.  Radiological Exams: DG Abd 1 View  Result Date: 05/01/2022 CLINICAL DATA:  Ileus.  Follow-up. EXAM: ABDOMEN - 1 VIEW COMPARISON:  04/27/2022 and older studies.  CT, 03/28/2022. FINDINGS: There is generalized increased bowel gas similar to that seen on the prior exam. There is no significant bowel dilation, however. Gastrostomy tube projects in the left upper abdomen, unchanged. IMPRESSION: 1. Bowel dilation noted on the previous exam has decreased. There is still increased bowel gas diffusely, both in small bowel and colon. Findings suggest improved, but not resolved, adynamic ileus. Electronically Signed   By: Lajean Manes M.D.   On: 05/01/2022 14:04    Assessment/Plan Active Problems:   ALS (amyotrophic lateral sclerosis) (HCC)   Septic shock (HCC)   Pseudomonas pneumonia (HCC)   Autonomic dysfunction   Acute on chronic respiratory failure with hypoxia (HCC)   Acute on chronic respiratory failure hypoxia patient is at baseline on the ventilator full support ALS no change continue to monitor Sepsis shock resolved Pseudomonas pneumonia has been treated clinically improving Autonomic dysfunction no change we will continue to follow   I have personally seen and evaluated the patient, evaluated laboratory and imaging results, formulated  the assessment and plan and placed orders. The Patient requires high complexity decision making with multiple systems involvement.  Rounds were done with the Respiratory Therapy Director and Staff therapists and discussed with nursing staff also.  Allyne Gee, MD [facility] Pulmonary Critical Care Medicine Sleep Medicine

## 2022-05-04 ENCOUNTER — Other Ambulatory Visit (HOSPITAL_COMMUNITY): Payer: Self-pay

## 2022-05-04 DIAGNOSIS — G1221 Amyotrophic lateral sclerosis: Secondary | ICD-10-CM | POA: Diagnosis not present

## 2022-05-04 DIAGNOSIS — A419 Sepsis, unspecified organism: Secondary | ICD-10-CM | POA: Diagnosis not present

## 2022-05-04 DIAGNOSIS — R6521 Severe sepsis with septic shock: Secondary | ICD-10-CM | POA: Diagnosis not present

## 2022-05-04 DIAGNOSIS — K6389 Other specified diseases of intestine: Secondary | ICD-10-CM | POA: Diagnosis not present

## 2022-05-04 DIAGNOSIS — J151 Pneumonia due to Pseudomonas: Secondary | ICD-10-CM | POA: Diagnosis not present

## 2022-05-04 DIAGNOSIS — J9621 Acute and chronic respiratory failure with hypoxia: Secondary | ICD-10-CM | POA: Diagnosis not present

## 2022-05-04 DIAGNOSIS — G909 Disorder of the autonomic nervous system, unspecified: Secondary | ICD-10-CM | POA: Diagnosis not present

## 2022-05-04 LAB — CBC
HCT: 31.2 % — ABNORMAL LOW (ref 39.0–52.0)
Hemoglobin: 9.9 g/dL — ABNORMAL LOW (ref 13.0–17.0)
MCH: 27.1 pg (ref 26.0–34.0)
MCHC: 31.7 g/dL (ref 30.0–36.0)
MCV: 85.5 fL (ref 80.0–100.0)
Platelets: 196 10*3/uL (ref 150–400)
RBC: 3.65 MIL/uL — ABNORMAL LOW (ref 4.22–5.81)
RDW: 17.4 % — ABNORMAL HIGH (ref 11.5–15.5)
WBC: 7.8 10*3/uL (ref 4.0–10.5)
nRBC: 0 % (ref 0.0–0.2)

## 2022-05-04 LAB — BASIC METABOLIC PANEL
Anion gap: 8 (ref 5–15)
BUN: 21 mg/dL — ABNORMAL HIGH (ref 6–20)
CO2: 22 mmol/L (ref 22–32)
Calcium: 9.7 mg/dL (ref 8.9–10.3)
Chloride: 109 mmol/L (ref 98–111)
Creatinine, Ser: <0.3 mg/dL — ABNORMAL LOW (ref 0.61–1.24)
Glucose, Bld: 130 mg/dL — ABNORMAL HIGH (ref 70–99)
Potassium: 4 mmol/L (ref 3.5–5.1)
Sodium: 139 mmol/L (ref 135–145)

## 2022-05-04 LAB — MAGNESIUM: Magnesium: 1.9 mg/dL (ref 1.7–2.4)

## 2022-05-04 LAB — PHOSPHORUS: Phosphorus: 3.4 mg/dL (ref 2.5–4.6)

## 2022-05-04 NOTE — Progress Notes (Signed)
Pulmonary Critical Care Medicine [place]   PULMONARY CRITICAL CARE SERVICE  PROGRESS NOTE     Jerry [name]  KZL:[id number]  DOB: February 10, 1971   DOA: 01/08/2022  Referring Physician: Satira Sark, MD  HPI: Jerry [name] is a 51 y.o. male being followed for ventilator/airway/oxygen weaning Acute on Chronic Respiratory Failure.  Patient is on the ventilator full support at baseline  Medications: Reviewed on Rounds  Physical Exam:  Vitals: Temperature is 98.9 pulse 79 respiratory 20 blood pressure is 102/65 saturations 99%  Ventilator Settings on assist control FiO2 is 28% tidal volume 500 PEEP of 5  General: Comfortable at this time Neck: supple Cardiovascular: no malignant arrhythmias Respiratory: Scattered rhonchi expansion equal Skin: no rash seen on limited exam Musculoskeletal: No gross abnormality Psychiatric:unable to assess Neurologic:no involuntary movements         Lab Data:   Basic Metabolic Panel: Recent Labs  Lab 04/28/22 0437 05/01/22 0114 05/03/22 0151 05/04/22 0218  NA 140  --  139 139  K 3.7  --  3.8 4.0  CL 108  --  110 109  CO2 22  --  21* 22  GLUCOSE 94  --  110* 130*  BUN 18  --  15 21*  CREATININE <0.30*  --  <0.30* <0.30*  CALCIUM 9.7  --  9.8 9.7  MG 1.9 1.9 2.0 1.9  PHOS 3.7 3.4  --  3.4    ABG: No results for input(s): "PHART", "PCO2ART", "PO2ART", "HCO3", "O2SAT" in the last 168 hours.  Liver Function Tests: No results for input(s): "AST", "ALT", "ALKPHOS", "BILITOT", "PROT", "ALBUMIN" in the last 168 hours. No results for input(s): "LIPASE", "AMYLASE" in the last 168 hours. No results for input(s): "AMMONIA" in the last 168 hours.  CBC: Recent Labs  Lab 04/28/22 0437 05/03/22 0151 05/04/22 0218  WBC 7.4 9.1 7.8  HGB 9.7* 11.4* 9.9*  HCT 29.8* 36.7* 31.2*  MCV 83.7 85.7 85.5  PLT 185 210 196    Cardiac Enzymes: No results for input(s): "CKTOTAL", "CKMB", "CKMBINDEX", "TROPONINI" in the last 168  hours.  BNP (last 3 results) No results for input(s): "BNP" in the last 8760 hours.  ProBNP (last 3 results) No results for input(s): "PROBNP" in the last 8760 hours.  Radiological Exams: DG Abd Portable 1V  Result Date: 05/04/2022 CLINICAL DATA:  Ileus. EXAM: PORTABLE ABDOMEN - 1 VIEW COMPARISON:  03/01/2022 FINDINGS: Gastrostomy tube is again noted in the left upper quadrant of the abdomen. There is persistent diffuse gaseous distension of the bowel loops which is not significantly improved when compared with the previous exam. Gas is noted up to the level of the rectum as before. IMPRESSION: Persistent diffuse gaseous distension of the bowel loops which is not significantly improved when compared with previous exam. Electronically Signed   By: Kerby Moors M.D.   On: 05/04/2022 06:42    Assessment/Plan Active Problems:   ALS (amyotrophic lateral sclerosis) (HCC)   Septic shock (HCC)   Pseudomonas pneumonia (HCC)   Autonomic dysfunction   Acute on chronic respiratory failure with hypoxia (HCC)   Acute on chronic respiratory failure hypoxia plan is to continue with full support on the ventilator patient is deemed not weanable Sepsis with shock resolved hemodynamics are stable Pseudomonas pneumonia has been treated with antibiotics Autonomic dysfunction supportive care ALS advanced severe disease we will continue to monitor   I have personally seen and evaluated the patient, evaluated laboratory and imaging results, formulated the assessment and plan and  placed orders. The Patient requires high complexity decision making with multiple systems involvement.  Rounds were done with the Respiratory Therapy Director and Staff therapists and discussed with nursing staff also.  Allyne Gee, MD [facility] Pulmonary Critical Care Medicine Sleep Medicine

## 2022-05-05 DIAGNOSIS — G909 Disorder of the autonomic nervous system, unspecified: Secondary | ICD-10-CM | POA: Diagnosis not present

## 2022-05-05 DIAGNOSIS — J151 Pneumonia due to Pseudomonas: Secondary | ICD-10-CM | POA: Diagnosis not present

## 2022-05-05 DIAGNOSIS — R6521 Severe sepsis with septic shock: Secondary | ICD-10-CM | POA: Diagnosis not present

## 2022-05-05 DIAGNOSIS — A419 Sepsis, unspecified organism: Secondary | ICD-10-CM | POA: Diagnosis not present

## 2022-05-05 DIAGNOSIS — J9621 Acute and chronic respiratory failure with hypoxia: Secondary | ICD-10-CM | POA: Diagnosis not present

## 2022-05-05 DIAGNOSIS — G1221 Amyotrophic lateral sclerosis: Secondary | ICD-10-CM | POA: Diagnosis not present

## 2022-05-05 LAB — TRIGLYCERIDES: Triglycerides: 35 mg/dL (ref <150)

## 2022-05-05 NOTE — Assessment & Plan Note (Signed)
[id number]

## 2022-05-05 NOTE — Progress Notes (Signed)
Pulmonary Critical Care Medicine [place]   PULMONARY CRITICAL CARE SERVICE  PROGRESS NOTE     Jerry [name]  [id number]:[id number]  DOB: [birth year]   DOA: 01/08/2022  Referring Physician: Satira Sark, MD  HPI: Jerry [name] is a 51 y.o. male being followed for ventilator/airway/oxygen weaning Acute on Chronic Respiratory Failure.  At this time patient is on the ventilator full support at baseline  Medications: Reviewed on Rounds  Physical Exam:  Vitals: Temperature is 98.4 pulse 96 respiratory rate 30 blood pressure is 125/76 saturations 97%  Ventilator Settings assist-control FiO2 28% tidal volume 500 PEEP 5  General: Comfortable at this time Neck: supple Cardiovascular: no malignant arrhythmias Respiratory: No rhonchi very coarse breath sounds Skin: no rash seen on limited exam Musculoskeletal: No gross abnormality Psychiatric:unable to assess Neurologic:no involuntary movements         Lab Data:   Basic Metabolic Panel: Recent Labs  Lab 05/01/22 0114 05/03/22 0151 05/04/22 0218  NA  --  139 139  K  --  3.8 4.0  CL  --  110 109  CO2  --  21* 22  GLUCOSE  --  110* 130*  BUN  --  15 21*  CREATININE  --  <0.30* <0.30*  CALCIUM  --  9.8 9.7  MG 1.9 2.0 1.9  PHOS 3.4  --  3.4    ABG: No results for input(s): "PHART", "PCO2ART", "PO2ART", "HCO3", "O2SAT" in the last 168 hours.  Liver Function Tests: No results for input(s): "AST", "ALT", "ALKPHOS", "BILITOT", "PROT", "ALBUMIN" in the last 168 hours. No results for input(s): "LIPASE", "AMYLASE" in the last 168 hours. No results for input(s): "AMMONIA" in the last 168 hours.  CBC: Recent Labs  Lab 05/03/22 0151 05/04/22 0218  WBC 9.1 7.8  HGB 11.4* 9.9*  HCT 36.7* 31.2*  MCV 85.7 85.5  PLT 210 196    Cardiac Enzymes: No results for input(s): "CKTOTAL", "CKMB", "CKMBINDEX", "TROPONINI" in the last 168 hours.  BNP (last 3 results) No results for input(s): "BNP" in the last 8760  hours.  ProBNP (last 3 results) No results for input(s): "PROBNP" in the last 8760 hours.  Radiological Exams: DG Abd Portable 1V  Result Date: 05/04/2022 CLINICAL DATA:  Ileus. EXAM: PORTABLE ABDOMEN - 1 VIEW COMPARISON:  03/01/2022 FINDINGS: Gastrostomy tube is again noted in the left upper quadrant of the abdomen. There is persistent diffuse gaseous distension of the bowel loops which is not significantly improved when compared with the previous exam. Gas is noted up to the level of the rectum as before. IMPRESSION: Persistent diffuse gaseous distension of the bowel loops which is not significantly improved when compared with previous exam. Electronically Signed   By: Kerby Moors M.D.   On: 05/04/2022 06:42    Assessment/Plan Active Problems:   ALS (amyotrophic lateral sclerosis) (HCC)   Septic shock (HCC)   Pseudomonas pneumonia (HCC)   Autonomic dysfunction   Acute on chronic respiratory failure with hypoxia (HCC)    Acute on chronic respiratory failure hypoxia plan is to continue with full support on the ventilator patient is awaiting discharge Severe sepsis resolved hemodynamics are stable Pseudomonas pneumonia has been treated we will continue to monitor closely Autonomic dysfunction has been stable ALS advanced severe disease supportive care   I have personally seen and evaluated the patient, evaluated laboratory and imaging results, formulated the assessment and plan and placed orders. The Patient requires high complexity decision making with multiple systems involvement.  Rounds were  done with the Respiratory Therapy Director and Staff therapists and discussed with nursing staff also.  Allyne Gee, MD [facility] Pulmonary Critical Care Medicine Sleep Medicine

## 2022-05-06 DIAGNOSIS — G909 Disorder of the autonomic nervous system, unspecified: Secondary | ICD-10-CM | POA: Diagnosis not present

## 2022-05-06 DIAGNOSIS — R6521 Severe sepsis with septic shock: Secondary | ICD-10-CM | POA: Diagnosis not present

## 2022-05-06 DIAGNOSIS — A419 Sepsis, unspecified organism: Secondary | ICD-10-CM | POA: Diagnosis not present

## 2022-05-06 DIAGNOSIS — J151 Pneumonia due to Pseudomonas: Secondary | ICD-10-CM | POA: Diagnosis not present

## 2022-05-06 DIAGNOSIS — G1221 Amyotrophic lateral sclerosis: Secondary | ICD-10-CM | POA: Diagnosis not present

## 2022-05-06 DIAGNOSIS — J9621 Acute and chronic respiratory failure with hypoxia: Secondary | ICD-10-CM | POA: Diagnosis not present

## 2022-05-06 NOTE — Progress Notes (Addendum)
Pulmonary Critical Care Medicine [place]   PULMONARY CRITICAL CARE SERVICE  PROGRESS NOTE     Jerry [name]  [id number]:[id number]  DOB: [birth year]   DOA: 01/08/2022  Referring Physician: Satira Sark, MD  HPI: Jerry [name] is a 51 y.o. male being followed for ventilator/airway/oxygen weaning Acute on Chronic Respiratory Failure.  Patient seen lying bed, currently remains on baseline of full support ventilation.  Medications: Reviewed on Rounds  Physical Exam:  Vitals: Temp 98.0, pulse 105, respirations 23, BP 109/67, SPO2 100%  Ventilator Settings AC VC, FiO2 20%, tidal volume 500, rate 16, PEEP 5  General: Comfortable at this time Neck: supple Cardiovascular: no malignant arrhythmias Respiratory: Bilaterally diminished Skin: no rash seen on limited exam Musculoskeletal: No gross abnormality Psychiatric:unable to assess Neurologic:no involuntary movements         Lab Data:   Basic Metabolic Panel: Recent Labs  Lab 05/01/22 0114 05/03/22 0151 05/04/22 0218  NA  --  139 139  K  --  3.8 4.0  CL  --  110 109  CO2  --  21* 22  GLUCOSE  --  110* 130*  BUN  --  15 21*  CREATININE  --  <0.30* <0.30*  CALCIUM  --  9.8 9.7  MG 1.9 2.0 1.9  PHOS 3.4  --  3.4    ABG: No results for input(s): "PHART", "PCO2ART", "PO2ART", "HCO3", "O2SAT" in the last 168 hours.  Liver Function Tests: No results for input(s): "AST", "ALT", "ALKPHOS", "BILITOT", "PROT", "ALBUMIN" in the last 168 hours. No results for input(s): "LIPASE", "AMYLASE" in the last 168 hours. No results for input(s): "AMMONIA" in the last 168 hours.  CBC: Recent Labs  Lab 05/03/22 0151 05/04/22 0218  WBC 9.1 7.8  HGB 11.4* 9.9*  HCT 36.7* 31.2*  MCV 85.7 85.5  PLT 210 196    Cardiac Enzymes: No results for input(s): "CKTOTAL", "CKMB", "CKMBINDEX", "TROPONINI" in the last 168 hours.  BNP (last 3 results) No results for input(s): "BNP" in the last 8760 hours.  ProBNP (last 3  results) No results for input(s): "PROBNP" in the last 8760 hours.  Radiological Exams: No results found.  Assessment/Plan Active Problems:   ALS (amyotrophic lateral sclerosis) (HCC)   Septic shock (HCC)   Pseudomonas pneumonia (HCC)   Autonomic dysfunction   Acute on chronic respiratory failure with hypoxia (HCC)   Acute on chronic respiratory failure with hypoxia-patient remains on baseline of full support ventilation.  Continue supportive care. ALS-advanced severe disease.  Continue supportive care. Pseudomonas pneumonia-has been treated.  Continue to follow. Autonomic dysfunction-remains at baseline.  Continue supportive care. Status post tracheostomy-remains stable in place.  Continue trach care per protocol.   I have personally seen and evaluated the patient, evaluated laboratory and imaging results, formulated the assessment and plan and placed orders. The Patient requires high complexity decision making with multiple systems involvement.  Rounds were done with the Respiratory Therapy Director and Staff therapists and discussed with nursing staff also.  Allyne Gee, MD [facility] Pulmonary Critical Care Medicine Sleep Medicine

## 2022-05-06 NOTE — Progress Notes (Addendum)
Pulmonary Critical Care Medicine [place]   PULMONARY CRITICAL CARE SERVICE  PROGRESS NOTE     Jerry [name]  [id number]:[id number]  DOB: 09-01-1970   DOA: 01/08/2022  Referring Physician: Satira Sark, MD  HPI: Jerry [name] is a 51 y.o. male being followed for ventilator/airway/oxygen weaning Acute on Chronic Respiratory Failure.  Patient seen lying in bed, currently remains on baseline full support ventilation.  Does have a small amount of thin secretions present.  Medications: Reviewed on Rounds  Physical Exam:  Vitals: Temp 97.5, pulse 76, respirations 20, BP 112/67, SPO2 100%  Ventilator Settings AC VC, FiO2 20%, tidal line 500, rate 16, PEEP 5  General: Comfortable at this time Neck: supple Cardiovascular: no malignant arrhythmias Respiratory: Bilaterally diminished Skin: no rash seen on limited exam Musculoskeletal: No gross abnormality Psychiatric:unable to assess Neurologic:no involuntary movements         Lab Data:   Basic Metabolic Panel: Recent Labs  Lab 05/01/22 0114 05/03/22 0151 05/04/22 0218  NA  --  139 139  K  --  3.8 4.0  CL  --  110 109  CO2  --  21* 22  GLUCOSE  --  110* 130*  BUN  --  15 21*  CREATININE  --  <0.30* <0.30*  CALCIUM  --  9.8 9.7  MG 1.9 2.0 1.9  PHOS 3.4  --  3.4    ABG: No results for input(s): "PHART", "PCO2ART", "PO2ART", "HCO3", "O2SAT" in the last 168 hours.  Liver Function Tests: No results for input(s): "AST", "ALT", "ALKPHOS", "BILITOT", "PROT", "ALBUMIN" in the last 168 hours. No results for input(s): "LIPASE", "AMYLASE" in the last 168 hours. No results for input(s): "AMMONIA" in the last 168 hours.  CBC: Recent Labs  Lab 05/03/22 0151 05/04/22 0218  WBC 9.1 7.8  HGB 11.4* 9.9*  HCT 36.7* 31.2*  MCV 85.7 85.5  PLT 210 196    Cardiac Enzymes: No results for input(s): "CKTOTAL", "CKMB", "CKMBINDEX", "TROPONINI" in the last 168 hours.  BNP (last 3 results) No results for  input(s): "BNP" in the last 8760 hours.  ProBNP (last 3 results) No results for input(s): "PROBNP" in the last 8760 hours.  Radiological Exams: No results found.  Assessment/Plan Active Problems:   ALS (amyotrophic lateral sclerosis) (HCC)   Septic shock (HCC)   Pseudomonas pneumonia (HCC)   Autonomic dysfunction   Acute on chronic respiratory failure with hypoxia (HCC)   Acute on chronic respiratory failure hypoxia plan is to continue with full support on the ventilator patient is awaiting discharge Severe sepsis resolved hemodynamics are stable Pseudomonas pneumonia has been treated we will continue to monitor closely Autonomic dysfunction has been stable ALS advanced severe disease supportive care   I have personally seen and evaluated the patient, evaluated laboratory and imaging results, formulated the assessment and plan and placed orders. The Patient requires high complexity decision making with multiple systems involvement.  Rounds were done with the Respiratory Therapy Director and Staff therapists and discussed with nursing staff also.  Allyne Gee, MD [facility] Pulmonary Critical Care Medicine Sleep Medicine

## 2022-05-07 ENCOUNTER — Other Ambulatory Visit (HOSPITAL_COMMUNITY): Payer: Self-pay

## 2022-05-07 DIAGNOSIS — A419 Sepsis, unspecified organism: Secondary | ICD-10-CM | POA: Diagnosis not present

## 2022-05-07 DIAGNOSIS — J9621 Acute and chronic respiratory failure with hypoxia: Secondary | ICD-10-CM | POA: Diagnosis not present

## 2022-05-07 DIAGNOSIS — G1221 Amyotrophic lateral sclerosis: Secondary | ICD-10-CM | POA: Diagnosis not present

## 2022-05-07 DIAGNOSIS — G909 Disorder of the autonomic nervous system, unspecified: Secondary | ICD-10-CM | POA: Diagnosis not present

## 2022-05-07 DIAGNOSIS — J151 Pneumonia due to Pseudomonas: Secondary | ICD-10-CM | POA: Diagnosis not present

## 2022-05-07 DIAGNOSIS — R6521 Severe sepsis with septic shock: Secondary | ICD-10-CM | POA: Diagnosis not present

## 2022-05-07 DIAGNOSIS — J189 Pneumonia, unspecified organism: Secondary | ICD-10-CM | POA: Diagnosis not present

## 2022-05-07 LAB — MAGNESIUM: Magnesium: 2 mg/dL (ref 1.7–2.4)

## 2022-05-07 LAB — PHOSPHORUS: Phosphorus: 3.6 mg/dL (ref 2.5–4.6)

## 2022-05-08 DIAGNOSIS — G1221 Amyotrophic lateral sclerosis: Secondary | ICD-10-CM | POA: Diagnosis not present

## 2022-05-08 DIAGNOSIS — R6521 Severe sepsis with septic shock: Secondary | ICD-10-CM | POA: Diagnosis not present

## 2022-05-08 DIAGNOSIS — A419 Sepsis, unspecified organism: Secondary | ICD-10-CM | POA: Diagnosis not present

## 2022-05-08 DIAGNOSIS — J9621 Acute and chronic respiratory failure with hypoxia: Secondary | ICD-10-CM | POA: Diagnosis not present

## 2022-05-08 DIAGNOSIS — J151 Pneumonia due to Pseudomonas: Secondary | ICD-10-CM | POA: Diagnosis not present

## 2022-05-08 DIAGNOSIS — G909 Disorder of the autonomic nervous system, unspecified: Secondary | ICD-10-CM | POA: Diagnosis not present

## 2022-05-08 LAB — CBC
HCT: 30 % — ABNORMAL LOW (ref 39.0–52.0)
Hemoglobin: 9.2 g/dL — ABNORMAL LOW (ref 13.0–17.0)
MCH: 26.4 pg (ref 26.0–34.0)
MCHC: 30.7 g/dL (ref 30.0–36.0)
MCV: 86.2 fL (ref 80.0–100.0)
Platelets: 175 10*3/uL (ref 150–400)
RBC: 3.48 MIL/uL — ABNORMAL LOW (ref 4.22–5.81)
RDW: 17.2 % — ABNORMAL HIGH (ref 11.5–15.5)
WBC: 6.7 10*3/uL (ref 4.0–10.5)
nRBC: 0 % (ref 0.0–0.2)

## 2022-05-08 LAB — BASIC METABOLIC PANEL
Anion gap: 6 (ref 5–15)
BUN: 19 mg/dL (ref 6–20)
CO2: 23 mmol/L (ref 22–32)
Calcium: 9.4 mg/dL (ref 8.9–10.3)
Chloride: 108 mmol/L (ref 98–111)
Creatinine, Ser: <0.3 mg/dL — ABNORMAL LOW (ref 0.61–1.24)
Glucose, Bld: 134 mg/dL — ABNORMAL HIGH (ref 70–99)
Potassium: 3.7 mmol/L (ref 3.5–5.1)
Sodium: 137 mmol/L (ref 135–145)

## 2022-05-08 LAB — MAGNESIUM: Magnesium: 1.9 mg/dL (ref 1.7–2.4)

## 2022-05-08 NOTE — Progress Notes (Cosign Needed)
Pulmonary Critical Care Medicine [place]   PULMONARY CRITICAL CARE SERVICE  PROGRESS NOTE     Jerry [name]  [id number]  DOB: July 24, 1970   DOA: 01/08/2022  Referring Physician: Satira Sark, MD  HPI: Jerry [name] is a 51 y.o. male being followed for ventilator/airway/oxygen weaning Acute on Chronic Respiratory Failure.  Patient seen lying in bed, wife at bedside.  Remains on baseline of full support ventilation.  Medications: Reviewed on Rounds  Physical Exam:  Vitals: Temp 98.3, pulse 77, respirations 20, BP 108/63, SPO2 99%  Ventilator Settings AC VC, FiO2 28%, tidal volume 500, rate 16, PEEP 5   General: Comfortable at this time Neck: supple Cardiovascular: no malignant arrhythmias Respiratory: Bilaterally diminished Skin: no rash seen on limited exam Musculoskeletal: No gross abnormality Psychiatric:unable to assess Neurologic:no involuntary movements         Lab Data:   Basic Metabolic Panel: Recent Labs  Lab 05/03/22 0151 05/04/22 0218 05/07/22 0130 05/08/22 0116  NA 139 139  --  137  K 3.8 4.0  --  3.7  CL 110 109  --  108  CO2 21* 22  --  23  GLUCOSE 110* 130*  --  134*  BUN 15 21*  --  19  CREATININE <0.30* <0.30*  --  <0.30*  CALCIUM 9.8 9.7  --  9.4  MG 2.0 1.9 2.0 1.9  PHOS  --  3.4 3.6  --     ABG: No results for input(s): "PHART", "PCO2ART", "PO2ART", "HCO3", "O2SAT" in the last 168 hours.  Liver Function Tests: No results for input(s): "AST", "ALT", "ALKPHOS", "BILITOT", "PROT", "ALBUMIN" in the last 168 hours. No results for input(s): "LIPASE", "AMYLASE" in the last 168 hours. No results for input(s): "AMMONIA" in the last 168 hours.  CBC: Recent Labs  Lab 05/03/22 0151 05/04/22 0218 05/08/22 0116  WBC 9.1 7.8 6.7  HGB 11.4* 9.9* 9.2*  HCT 36.7* 31.2* 30.0*  MCV 85.7 85.5 86.2  PLT 210 196 175    Cardiac Enzymes: No results for input(s): "CKTOTAL", "CKMB", "CKMBINDEX", "TROPONINI" in the last 168  hours.  BNP (last 3 results) No results for input(s): "BNP" in the last 8760 hours.  ProBNP (last 3 results) No results for input(s): "PROBNP" in the last 8760 hours.  Radiological Exams: DG CHEST PORT 1 VIEW  Result Date: 05/07/2022 CLINICAL DATA:  Temperature increase EXAM: PORTABLE CHEST 1 VIEW COMPARISON:  04/09/2022 FINDINGS: Right pulmonary infiltrate, borderline progressed. New mild infiltrate at the medial right base. Unchanged elevation of the left diaphragm. Porta catheter and PICC on the right with tips at the upper cavoatrial junction/upper right atrium. Located tracheostomy tube. Normal heart size. IMPRESSION: Left more than right pneumonia.  Elevated left diaphragm. Electronically Signed   By: Jorje Guild M.D.   On: 05/07/2022 09:14    Assessment/Plan Active Problems:   ALS (amyotrophic lateral sclerosis) (HCC)   Septic shock (HCC)   Pseudomonas pneumonia (HCC)   Autonomic dysfunction   Acute on chronic respiratory failure with hypoxia (HCC)  Acute on chronic respiratory failure with hypoxia-patient remains on baseline of full support ventilation.  Continue supportive care. ALS-advanced severe disease.  Continue supportive care. Pseudomonas pneumonia-has been treated.  Continue to follow. Autonomic dysfunction-remains at baseline.  Continue supportive care. Status post tracheostomy-remains stable in place.  Continue trach care per protocol.   I have personally seen and evaluated the patient, evaluated laboratory and imaging results, formulated the assessment and plan and placed orders. The Patient requires  high complexity decision making with multiple systems involvement.  Rounds were done with the Respiratory Therapy Director and Staff therapists and discussed with nursing staff also.  Allyne Gee, MD [facility] Pulmonary Critical Care Medicine Sleep Medicine

## 2022-05-08 NOTE — Progress Notes (Cosign Needed)
Pulmonary Critical Care Medicine [place]   PULMONARY CRITICAL CARE SERVICE  PROGRESS NOTE     Jerry [name]  [id number]  DOB: 1971-03-10   DOA: 01/08/2022  Referring Physician: Satira Sark, MD  HPI: Jerry [name] is a 51 y.o. male being followed for ventilator/airway/oxygen weaning Acute on Chronic Respiratory Failure.  Patient seen lying in bed, currently remains on baseline.  Ventilation.  No acute overnight events noted.  Medications: Reviewed on Rounds  Physical Exam:  Vitals: Temp 99.0, pulse 90, respirations 23, BP 113/69, SPO2 99%  Ventilator Settings AC VC, FiO2 28%, tidal volume 500, rate 16, PEEP 5    General: Comfortable at this time Neck: supple Cardiovascular: no malignant arrhythmias Respiratory: Bilaterally diminished Skin: no rash seen on limited exam Musculoskeletal: No gross abnormality Psychiatric:unable to assess Neurologic:no involuntary movements         Lab Data:   Basic Metabolic Panel: Recent Labs  Lab 05/03/22 0151 05/04/22 0218 05/07/22 0130 05/08/22 0116  NA 139 139  --  137  K 3.8 4.0  --  3.7  CL 110 109  --  108  CO2 21* 22  --  23  GLUCOSE 110* 130*  --  134*  BUN 15 21*  --  19  CREATININE <0.30* <0.30*  --  <0.30*  CALCIUM 9.8 9.7  --  9.4  MG 2.0 1.9 2.0 1.9  PHOS  --  3.4 3.6  --     ABG: No results for input(s): "PHART", "PCO2ART", "PO2ART", "HCO3", "O2SAT" in the last 168 hours.  Liver Function Tests: No results for input(s): "AST", "ALT", "ALKPHOS", "BILITOT", "PROT", "ALBUMIN" in the last 168 hours. No results for input(s): "LIPASE", "AMYLASE" in the last 168 hours. No results for input(s): "AMMONIA" in the last 168 hours.  CBC: Recent Labs  Lab 05/03/22 0151 05/04/22 0218 05/08/22 0116  WBC 9.1 7.8 6.7  HGB 11.4* 9.9* 9.2*  HCT 36.7* 31.2* 30.0*  MCV 85.7 85.5 86.2  PLT 210 196 175    Cardiac Enzymes: No results for input(s): "CKTOTAL", "CKMB", "CKMBINDEX", "TROPONINI" in  the last 168 hours.  BNP (last 3 results) No results for input(s): "BNP" in the last 8760 hours.  ProBNP (last 3 results) No results for input(s): "PROBNP" in the last 8760 hours.  Radiological Exams: DG CHEST PORT 1 VIEW  Result Date: 05/07/2022 CLINICAL DATA:  Temperature increase EXAM: PORTABLE CHEST 1 VIEW COMPARISON:  04/09/2022 FINDINGS: Right pulmonary infiltrate, borderline progressed. New mild infiltrate at the medial right base. Unchanged elevation of the left diaphragm. Porta catheter and PICC on the right with tips at the upper cavoatrial junction/upper right atrium. Located tracheostomy tube. Normal heart size. IMPRESSION: Left more than right pneumonia.  Elevated left diaphragm. Electronically Signed   By: Jorje Guild M.D.   On: 05/07/2022 09:14    Assessment/Plan Active Problems:   ALS (amyotrophic lateral sclerosis) (HCC)   Septic shock (HCC)   Pseudomonas pneumonia (HCC)   Autonomic dysfunction   Acute on chronic respiratory failure with hypoxia (HCC)  Acute on chronic respiratory failure with hypoxia-patient remains on baseline of full support ventilation.  Continue supportive care. ALS-advanced severe disease.  Continue supportive care. Pseudomonas pneumonia-has been treated.  Continue to follow. Autonomic dysfunction-remains at baseline.  Continue supportive care. Status post tracheostomy-remains stable in place.  Continue trach care per protocol.   I have personally seen and evaluated the patient, evaluated laboratory and imaging results, formulated the assessment and plan and placed orders. The  Patient requires high complexity decision making with multiple systems involvement.  Rounds were done with the Respiratory Therapy Director and Staff therapists and discussed with nursing staff also.  Allyne Gee, MD [facility] Pulmonary Critical Care Medicine Sleep Medicine

## 2022-05-09 DIAGNOSIS — J9621 Acute and chronic respiratory failure with hypoxia: Secondary | ICD-10-CM | POA: Diagnosis not present

## 2022-05-09 DIAGNOSIS — J151 Pneumonia due to Pseudomonas: Secondary | ICD-10-CM | POA: Diagnosis not present

## 2022-05-09 DIAGNOSIS — G909 Disorder of the autonomic nervous system, unspecified: Secondary | ICD-10-CM | POA: Diagnosis not present

## 2022-05-09 DIAGNOSIS — R6521 Severe sepsis with septic shock: Secondary | ICD-10-CM | POA: Diagnosis not present

## 2022-05-09 DIAGNOSIS — A419 Sepsis, unspecified organism: Secondary | ICD-10-CM | POA: Diagnosis not present

## 2022-05-09 DIAGNOSIS — G1221 Amyotrophic lateral sclerosis: Secondary | ICD-10-CM | POA: Diagnosis not present

## 2022-05-09 LAB — URINE CULTURE: Culture: NO GROWTH

## 2022-05-10 ENCOUNTER — Other Ambulatory Visit (HOSPITAL_COMMUNITY): Payer: Self-pay

## 2022-05-10 DIAGNOSIS — G909 Disorder of the autonomic nervous system, unspecified: Secondary | ICD-10-CM | POA: Diagnosis not present

## 2022-05-10 DIAGNOSIS — K6389 Other specified diseases of intestine: Secondary | ICD-10-CM | POA: Diagnosis not present

## 2022-05-10 DIAGNOSIS — A419 Sepsis, unspecified organism: Secondary | ICD-10-CM | POA: Diagnosis not present

## 2022-05-10 DIAGNOSIS — R6521 Severe sepsis with septic shock: Secondary | ICD-10-CM | POA: Diagnosis not present

## 2022-05-10 DIAGNOSIS — J9621 Acute and chronic respiratory failure with hypoxia: Secondary | ICD-10-CM | POA: Diagnosis not present

## 2022-05-10 DIAGNOSIS — G1221 Amyotrophic lateral sclerosis: Secondary | ICD-10-CM | POA: Diagnosis not present

## 2022-05-10 DIAGNOSIS — J151 Pneumonia due to Pseudomonas: Secondary | ICD-10-CM | POA: Diagnosis not present

## 2022-05-10 LAB — CBC
HCT: 33.9 % — ABNORMAL LOW (ref 39.0–52.0)
Hemoglobin: 10.8 g/dL — ABNORMAL LOW (ref 13.0–17.0)
MCH: 27 pg (ref 26.0–34.0)
MCHC: 31.9 g/dL (ref 30.0–36.0)
MCV: 84.8 fL (ref 80.0–100.0)
Platelets: 156 10*3/uL (ref 150–400)
RBC: 4 MIL/uL — ABNORMAL LOW (ref 4.22–5.81)
RDW: 17.1 % — ABNORMAL HIGH (ref 11.5–15.5)
WBC: 6.7 10*3/uL (ref 4.0–10.5)
nRBC: 0 % (ref 0.0–0.2)

## 2022-05-10 LAB — CULTURE, RESPIRATORY W GRAM STAIN

## 2022-05-10 LAB — BASIC METABOLIC PANEL
Anion gap: 7 (ref 5–15)
BUN: 18 mg/dL (ref 6–20)
CO2: 21 mmol/L — ABNORMAL LOW (ref 22–32)
Calcium: 9.4 mg/dL (ref 8.9–10.3)
Chloride: 109 mmol/L (ref 98–111)
Creatinine, Ser: <0.3 mg/dL — ABNORMAL LOW (ref 0.61–1.24)
Glucose, Bld: 103 mg/dL — ABNORMAL HIGH (ref 70–99)
Potassium: 3.7 mmol/L (ref 3.5–5.1)
Sodium: 137 mmol/L (ref 135–145)

## 2022-05-10 LAB — PHOSPHORUS: Phosphorus: 3.1 mg/dL (ref 2.5–4.6)

## 2022-05-10 LAB — MAGNESIUM: Magnesium: 1.9 mg/dL (ref 1.7–2.4)

## 2022-05-10 NOTE — Progress Notes (Cosign Needed)
Pulmonary Critical Care Medicine [place]   PULMONARY CRITICAL CARE SERVICE  PROGRESS NOTE     Jerry [name]  [id number]:[id number]  DOB: Jun 12, 1971   DOA: 01/08/2022  Referring Physician: Satira Sark, MD  HPI: Jerry [name] is a 51 y.o. male being followed for ventilator/airway/oxygen weaning Acute on Chronic Respiratory Failure.  Patient seen lying in bed, currently remains on baseline full support ventilation.  Medications: Reviewed on Rounds  Physical Exam:  Vitals: Temp 97.5, pulse 67, respirations 27, BP 116/74, SPO2 100%  Ventilator Settings AC VC, FiO2 28%, tidal volume 500, rate 16, PEEP 5     General: Comfortable at this time Neck: supple Cardiovascular: no malignant arrhythmias Respiratory: Bilaterally diminished Skin: no rash seen on limited exam Musculoskeletal: No gross abnormality Psychiatric:unable to assess Neurologic:no involuntary movements         Lab Data:   Basic Metabolic Panel: Recent Labs  Lab 05/04/22 0218 05/07/22 0130 05/08/22 0116 05/10/22 0143  NA 139  --  137 137  K 4.0  --  3.7 3.7  CL 109  --  108 109  CO2 22  --  23 21*  GLUCOSE 130*  --  134* 103*  BUN 21*  --  19 18  CREATININE <0.30*  --  <0.30* <0.30*  CALCIUM 9.7  --  9.4 9.4  MG 1.9 2.0 1.9 1.9  PHOS 3.4 3.6  --  3.1    ABG: No results for input(s): "PHART", "PCO2ART", "PO2ART", "HCO3", "O2SAT" in the last 168 hours.  Liver Function Tests: No results for input(s): "AST", "ALT", "ALKPHOS", "BILITOT", "PROT", "ALBUMIN" in the last 168 hours. No results for input(s): "LIPASE", "AMYLASE" in the last 168 hours. No results for input(s): "AMMONIA" in the last 168 hours.  CBC: Recent Labs  Lab 05/04/22 0218 05/08/22 0116 05/10/22 0143  WBC 7.8 6.7 6.7  HGB 9.9* 9.2* 10.8*  HCT 31.2* 30.0* 33.9*  MCV 85.5 86.2 84.8  PLT 196 175 156    Cardiac Enzymes: No results for input(s): "CKTOTAL", "CKMB", "CKMBINDEX", "TROPONINI" in the last 168  hours.  BNP (last 3 results) No results for input(s): "BNP" in the last 8760 hours.  ProBNP (last 3 results) No results for input(s): "PROBNP" in the last 8760 hours.  Radiological Exams: DG Abd Portable 1V  Result Date: 05/10/2022 CLINICAL DATA:  98749.  Ileus follow-up. EXAM: PORTABLE ABDOMEN - 1 VIEW COMPARISON:  Study of 05/04/2022 FINDINGS: Diffuse gas distention of the small and large bowel appears similar. Maximum colonic distention 10.4 cm in the ascending colon is unchanged. Left upper abdominal gastrostomy tube also again noted. Bowel gas remains present into the rectum. The overall bowel aeration is unaltered. No pathologic calcifications or other significant radiographic findings. There is no supine evidence of free air. IMPRESSION: Unchanged diffuse gas distention of the small and large bowel consistent with ileus. No significant change from prior study. Electronically Signed   By: Telford Nab M.D.   On: 05/10/2022 06:13    Assessment/Plan Active Problems:   ALS (amyotrophic lateral sclerosis) (HCC)   Septic shock (HCC)   Pseudomonas pneumonia (HCC)   Autonomic dysfunction   Acute on chronic respiratory failure with hypoxia (HCC)  Acute on chronic respiratory failure with hypoxia-patient remains on baseline of full support ventilation.  Continue supportive care. ALS-advanced severe disease.  Continue supportive care. Pseudomonas pneumonia-has been treated.  Continue to follow. Autonomic dysfunction-remains at baseline.  Continue supportive care. Status post tracheostomy-remains stable in place.  Continue trach care  per protocol.  I have personally seen and evaluated the patient, evaluated laboratory and imaging results, formulated the assessment and plan and placed orders. The Patient requires high complexity decision making with multiple systems involvement.  Rounds were done with the Respiratory Therapy Director and Staff therapists and discussed with nursing staff  also.  Allyne Gee, MD [facility] Pulmonary Critical Care Medicine Sleep Medicine

## 2022-05-10 NOTE — Progress Notes (Cosign Needed)
Pulmonary Critical Care Medicine [place]   PULMONARY CRITICAL CARE SERVICE  PROGRESS NOTE     Christopherjohn Schiele  GMW:[id number]  DOB: 24-Oct-1970   DOA: 01/08/2022  Referring Physician: Satira Sark, MD  HPI: Jerry [name] is a 51 y.o. male being followed for ventilator/airway/oxygen weaning Acute on Chronic Respiratory Failure.  Patient seen lying in bed, currently remains on baseline full support ventilation.  Medications: Reviewed on Rounds  Physical Exam:  Vitals: Temp 97.0, pulse 72, respirations 40, BP 132/86, SPO2 100%  Ventilator Settings  AC VC, FiO2 28%, tidal volume 500, rate 16, PEEP 5      General: Comfortable at this time Neck: supple Cardiovascular: no malignant arrhythmias Respiratory: Bilaterally diminished Skin: no rash seen on limited exam Musculoskeletal: No gross abnormality Psychiatric:unable to assess Neurologic:no involuntary movements         Lab Data:   Basic Metabolic Panel: Recent Labs  Lab 05/04/22 0218 05/07/22 0130 05/08/22 0116 05/10/22 0143  NA 139  --  137 137  K 4.0  --  3.7 3.7  CL 109  --  108 109  CO2 22  --  23 21*  GLUCOSE 130*  --  134* 103*  BUN 21*  --  19 18  CREATININE <0.30*  --  <0.30* <0.30*  CALCIUM 9.7  --  9.4 9.4  MG 1.9 2.0 1.9 1.9  PHOS 3.4 3.6  --  3.1    ABG: No results for input(s): "PHART", "PCO2ART", "PO2ART", "HCO3", "O2SAT" in the last 168 hours.  Liver Function Tests: No results for input(s): "AST", "ALT", "ALKPHOS", "BILITOT", "PROT", "ALBUMIN" in the last 168 hours. No results for input(s): "LIPASE", "AMYLASE" in the last 168 hours. No results for input(s): "AMMONIA" in the last 168 hours.  CBC: Recent Labs  Lab 05/04/22 0218 05/08/22 0116 05/10/22 0143  WBC 7.8 6.7 6.7  HGB 9.9* 9.2* 10.8*  HCT 31.2* 30.0* 33.9*  MCV 85.5 86.2 84.8  PLT 196 175 156    Cardiac Enzymes: No results for input(s): "CKTOTAL", "CKMB", "CKMBINDEX", "TROPONINI" in the last 168  hours.  BNP (last 3 results) No results for input(s): "BNP" in the last 8760 hours.  ProBNP (last 3 results) No results for input(s): "PROBNP" in the last 8760 hours.  Radiological Exams: DG Abd Portable 1V  Result Date: 05/10/2022 CLINICAL DATA:  98749.  Ileus follow-up. EXAM: PORTABLE ABDOMEN - 1 VIEW COMPARISON:  Study of 05/04/2022 FINDINGS: Diffuse gas distention of the small and large bowel appears similar. Maximum colonic distention 10.4 cm in the ascending colon is unchanged. Left upper abdominal gastrostomy tube also again noted. Bowel gas remains present into the rectum. The overall bowel aeration is unaltered. No pathologic calcifications or other significant radiographic findings. There is no supine evidence of free air. IMPRESSION: Unchanged diffuse gas distention of the small and large bowel consistent with ileus. No significant change from prior study. Electronically Signed   By: Telford Nab M.D.   On: 05/10/2022 06:13    Assessment/Plan Active Problems:   ALS (amyotrophic lateral sclerosis) (HCC)   Septic shock (HCC)   Pseudomonas pneumonia (HCC)   Autonomic dysfunction   Acute on chronic respiratory failure with hypoxia (HCC)   Acute on chronic respiratory failure with hypoxia-patient remains on baseline of full support ventilation.  Continue supportive care. ALS-advanced severe disease.  Continue supportive care. Pseudomonas pneumonia-has been treated.  Continue to follow. Autonomic dysfunction-remains at baseline.  Continue supportive care. Status post tracheostomy-remains stable in place.  Continue trach care per protocol.     I have personally seen and evaluated the patient, evaluated laboratory and imaging results, formulated the assessment and plan and placed orders. The Patient requires high complexity decision making with multiple systems involvement.  Rounds were done with the Respiratory Therapy Director and Staff therapists and discussed with nursing  staff also.  Allyne Gee, MD [facility] Pulmonary Critical Care Medicine Sleep Medicine

## 2022-05-11 DIAGNOSIS — J151 Pneumonia due to Pseudomonas: Secondary | ICD-10-CM | POA: Diagnosis not present

## 2022-05-11 DIAGNOSIS — G1221 Amyotrophic lateral sclerosis: Secondary | ICD-10-CM | POA: Diagnosis not present

## 2022-05-11 DIAGNOSIS — J9621 Acute and chronic respiratory failure with hypoxia: Secondary | ICD-10-CM | POA: Diagnosis not present

## 2022-05-11 DIAGNOSIS — G909 Disorder of the autonomic nervous system, unspecified: Secondary | ICD-10-CM | POA: Diagnosis not present

## 2022-05-11 DIAGNOSIS — A419 Sepsis, unspecified organism: Secondary | ICD-10-CM | POA: Diagnosis not present

## 2022-05-11 DIAGNOSIS — R6521 Severe sepsis with septic shock: Secondary | ICD-10-CM | POA: Diagnosis not present

## 2022-05-12 DIAGNOSIS — R6521 Severe sepsis with septic shock: Secondary | ICD-10-CM | POA: Diagnosis not present

## 2022-05-12 DIAGNOSIS — G909 Disorder of the autonomic nervous system, unspecified: Secondary | ICD-10-CM | POA: Diagnosis not present

## 2022-05-12 DIAGNOSIS — G1221 Amyotrophic lateral sclerosis: Secondary | ICD-10-CM | POA: Diagnosis not present

## 2022-05-12 DIAGNOSIS — A419 Sepsis, unspecified organism: Secondary | ICD-10-CM | POA: Diagnosis not present

## 2022-05-12 DIAGNOSIS — J151 Pneumonia due to Pseudomonas: Secondary | ICD-10-CM | POA: Diagnosis not present

## 2022-05-12 DIAGNOSIS — J9621 Acute and chronic respiratory failure with hypoxia: Secondary | ICD-10-CM | POA: Diagnosis not present

## 2022-05-12 LAB — CULTURE, BLOOD (ROUTINE X 2)
Culture: NO GROWTH
Culture: NO GROWTH
Special Requests: ADEQUATE
Special Requests: ADEQUATE

## 2022-05-12 LAB — TRIGLYCERIDES: Triglycerides: 55 mg/dL (ref <150)

## 2022-05-12 NOTE — Progress Notes (Cosign Needed)
Pulmonary Critical Care Medicine [place]   PULMONARY CRITICAL CARE SERVICE  PROGRESS NOTE     Jerry [name]  [id number]:[id number]  DOB: 11/13/1970   DOA: 01/08/2022  Referring Physician: Satira Sark, MD  HPI: Jerry [name] is a 51 y.o. male being followed for ventilator/airway/oxygen weaning Acute on Chronic Respiratory Failure.  Patient seen lying in bed, currently remains on full support ventilation.  Currently being treated for pneumonia per primary team.  Medications: Reviewed on Rounds  Physical Exam:  Vitals: Temp 97.2, pulse 76, respirations 21, BP 131/81, SPO2 99%  Ventilator Settings AC VC, FiO2 20%, tidal volume 500, rate 16, PEEP 5  General: Comfortable at this time Neck: supple Cardiovascular: no malignant arrhythmias Respiratory: Bilaterally diminished Skin: no rash seen on limited exam Musculoskeletal: No gross abnormality Psychiatric:unable to assess Neurologic:no involuntary movements         Lab Data:   Basic Metabolic Panel: Recent Labs  Lab 05/07/22 0130 05/08/22 0116 05/10/22 0143  NA  --  137 137  K  --  3.7 3.7  CL  --  108 109  CO2  --  23 21*  GLUCOSE  --  134* 103*  BUN  --  19 18  CREATININE  --  <0.30* <0.30*  CALCIUM  --  9.4 9.4  MG 2.0 1.9 1.9  PHOS 3.6  --  3.1    ABG: No results for input(s): "PHART", "PCO2ART", "PO2ART", "HCO3", "O2SAT" in the last 168 hours.  Liver Function Tests: No results for input(s): "AST", "ALT", "ALKPHOS", "BILITOT", "PROT", "ALBUMIN" in the last 168 hours. No results for input(s): "LIPASE", "AMYLASE" in the last 168 hours. No results for input(s): "AMMONIA" in the last 168 hours.  CBC: Recent Labs  Lab 05/08/22 0116 05/10/22 0143  WBC 6.7 6.7  HGB 9.2* 10.8*  HCT 30.0* 33.9*  MCV 86.2 84.8  PLT 175 156    Cardiac Enzymes: No results for input(s): "CKTOTAL", "CKMB", "CKMBINDEX", "TROPONINI" in the last 168 hours.  BNP (last 3 results) No results for input(s):  "BNP" in the last 8760 hours.  ProBNP (last 3 results) No results for input(s): "PROBNP" in the last 8760 hours.  Radiological Exams: No results found.  Assessment/Plan Active Problems:   ALS (amyotrophic lateral sclerosis) (HCC)   Septic shock (HCC)   Pseudomonas pneumonia (HCC)   Autonomic dysfunction   Acute on chronic respiratory failure with hypoxia (HCC)   Acute on chronic respiratory failure with hypoxia-patient remains on baseline of full support ventilation.  Continue supportive care. ALS-advanced severe disease.  Continue supportive care. Pseudomonas pneumonia-currently being treated.  Continue with supportive care. Autonomic dysfunction-remains at baseline.  Continue supportive care. Status post tracheostomy-remains stable in place.  Continue trach care per protocol.   I have personally seen and evaluated the patient, evaluated laboratory and imaging results, formulated the assessment and plan and placed orders. The Patient requires high complexity decision making with multiple systems involvement.  Rounds were done with the Respiratory Therapy Director and Staff therapists and discussed with nursing staff also.  Jerry Gee, MD [facility] Pulmonary Critical Care Medicine Sleep Medicine

## 2022-05-12 NOTE — Progress Notes (Cosign Needed)
Pulmonary Critical Care Medicine [place]   PULMONARY CRITICAL CARE SERVICE  PROGRESS NOTE     Jerry [name]  [id number]  DOB: 1970/07/14   DOA: 01/08/2022  Referring Physician: Satira Sark, MD  HPI: Jerry [name] is a 51 y.o. male being followed for ventilator/airway/oxygen weaning Acute on Chronic Respiratory Failure.  Patient seen lying in bed, currently remains on baseline of full support ventilation.  No acute overnight events noted.  Medications: Reviewed on Rounds  Physical Exam:  Vitals: Temp 97.8, pulse 88, respirations 37, BP 121/77, SPO2 97%  Ventilator Settings AC VC, FiO2 20%, tidal volume 500, rate 16, PEEP 5  General: Comfortable at this time Neck: supple Cardiovascular: no malignant arrhythmias Respiratory: Bilaterally diminished Skin: no rash seen on limited exam Musculoskeletal: No gross abnormality Psychiatric:unable to assess Neurologic:no involuntary movements         Lab Data:   Basic Metabolic Panel: Recent Labs  Lab 05/07/22 0130 05/08/22 0116 05/10/22 0143  NA  --  137 137  K  --  3.7 3.7  CL  --  108 109  CO2  --  23 21*  GLUCOSE  --  134* 103*  BUN  --  19 18  CREATININE  --  <0.30* <0.30*  CALCIUM  --  9.4 9.4  MG 2.0 1.9 1.9  PHOS 3.6  --  3.1    ABG: No results for input(s): "PHART", "PCO2ART", "PO2ART", "HCO3", "O2SAT" in the last 168 hours.  Liver Function Tests: No results for input(s): "AST", "ALT", "ALKPHOS", "BILITOT", "PROT", "ALBUMIN" in the last 168 hours. No results for input(s): "LIPASE", "AMYLASE" in the last 168 hours. No results for input(s): "AMMONIA" in the last 168 hours.  CBC: Recent Labs  Lab 05/08/22 0116 05/10/22 0143  WBC 6.7 6.7  HGB 9.2* 10.8*  HCT 30.0* 33.9*  MCV 86.2 84.8  PLT 175 156    Cardiac Enzymes: No results for input(s): "CKTOTAL", "CKMB", "CKMBINDEX", "TROPONINI" in the last 168 hours.  BNP (last 3 results) No results for input(s): "BNP" in the  last 8760 hours.  ProBNP (last 3 results) No results for input(s): "PROBNP" in the last 8760 hours.  Radiological Exams: No results found.  Assessment/Plan Active Problems:   ALS (amyotrophic lateral sclerosis) (HCC)   Septic shock (HCC)   Pseudomonas pneumonia (HCC)   Autonomic dysfunction   Acute on chronic respiratory failure with hypoxia (HCC)   Acute on chronic respiratory failure with hypoxia-patient remains on baseline of full support ventilation.  Continue supportive care. ALS-advanced severe disease.  Continue supportive care. Pseudomonas pneumonia-has been treated.  Continue to follow. Autonomic dysfunction-remains at baseline.  Continue supportive care. Status post tracheostomy-remains stable in place.  Continue trach care per protocol.   I have personally seen and evaluated the patient, evaluated laboratory and imaging results, formulated the assessment and plan and placed orders. The Patient requires high complexity decision making with multiple systems involvement.  Rounds were done with the Respiratory Therapy Director and Staff therapists and discussed with nursing staff also.  Allyne Gee, MD [facility] Pulmonary Critical Care Medicine Sleep Medicine

## 2022-05-13 DIAGNOSIS — J151 Pneumonia due to Pseudomonas: Secondary | ICD-10-CM | POA: Diagnosis not present

## 2022-05-13 DIAGNOSIS — G909 Disorder of the autonomic nervous system, unspecified: Secondary | ICD-10-CM | POA: Diagnosis not present

## 2022-05-13 DIAGNOSIS — J9621 Acute and chronic respiratory failure with hypoxia: Secondary | ICD-10-CM | POA: Diagnosis not present

## 2022-05-13 DIAGNOSIS — R6521 Severe sepsis with septic shock: Secondary | ICD-10-CM | POA: Diagnosis not present

## 2022-05-13 DIAGNOSIS — G1221 Amyotrophic lateral sclerosis: Secondary | ICD-10-CM | POA: Diagnosis not present

## 2022-05-13 DIAGNOSIS — A419 Sepsis, unspecified organism: Secondary | ICD-10-CM | POA: Diagnosis not present

## 2022-05-13 LAB — MAGNESIUM: Magnesium: 2 mg/dL (ref 1.7–2.4)

## 2022-05-13 LAB — PHOSPHORUS: Phosphorus: 2.9 mg/dL (ref 2.5–4.6)

## 2022-05-13 NOTE — Progress Notes (Signed)
Pulmonary Critical Care Medicine [place]   PULMONARY CRITICAL CARE SERVICE  PROGRESS NOTE     Jerry [name]  [id number]  DOB: [birth year]   DOA: 01/08/2022  Referring Physician: Satira Sark, MD  HPI: Jerry [name] is a 51 y.o. male being followed for ventilator/airway/oxygen weaning Acute on Chronic Respiratory Failure.  Patient currently is on full support on the ventilator assist control mode has been on 28% FiO2  Medications: Reviewed on Rounds  Physical Exam:  Vitals: Temperature is 99.8 pulse 79 respiratory rate is 19 blood pressure is 116/65 saturations 100%  Ventilator Settings assist-control FiO2 is 28% PEEP 5 tidal volume 500  General: Comfortable at this time Neck: supple Cardiovascular: no malignant arrhythmias Respiratory: No rhonchi no rales are noted at this time Skin: no rash seen on limited exam Musculoskeletal: No gross abnormality Psychiatric:unable to assess Neurologic:no involuntary movements         Lab Data:   Basic Metabolic Panel: Recent Labs  Lab 05/07/22 0130 05/08/22 0116 05/10/22 0143 05/13/22 0149  NA  --  137 137  --   K  --  3.7 3.7  --   CL  --  108 109  --   CO2  --  23 21*  --   GLUCOSE  --  134* 103*  --   BUN  --  19 18  --   CREATININE  --  <0.30* <0.30*  --   CALCIUM  --  9.4 9.4  --   MG 2.0 1.9 1.9 2.0  PHOS 3.6  --  3.1 2.9    ABG: No results for input(s): "PHART", "PCO2ART", "PO2ART", "HCO3", "O2SAT" in the last 168 hours.  Liver Function Tests: No results for input(s): "AST", "ALT", "ALKPHOS", "BILITOT", "PROT", "ALBUMIN" in the last 168 hours. No results for input(s): "LIPASE", "AMYLASE" in the last 168 hours. No results for input(s): "AMMONIA" in the last 168 hours.  CBC: Recent Labs  Lab 05/08/22 0116 05/10/22 0143  WBC 6.7 6.7  HGB 9.2* 10.8*  HCT 30.0* 33.9*  MCV 86.2 84.8  PLT 175 156    Cardiac Enzymes: No results for input(s): "CKTOTAL", "CKMB", "CKMBINDEX",  "TROPONINI" in the last 168 hours.  BNP (last 3 results) No results for input(s): "BNP" in the last 8760 hours.  ProBNP (last 3 results) No results for input(s): "PROBNP" in the last 8760 hours.  Radiological Exams: No results found.  Assessment/Plan Active Problems:   ALS (amyotrophic lateral sclerosis) (HCC)   Septic shock (HCC)   Pseudomonas pneumonia (HCC)   Autonomic dysfunction   Acute on chronic respiratory failure with hypoxia (HCC)   Acute on chronic respiratory failure hypoxia patient at baseline on full mechanical support ALS severe advanced disease supportive care Sepsis with shock treated resolved hemodynamics are stable Pseudomonas pneumonia has been treated with antibiotics Autonomic dysfunction we will continue to monitor   I have personally seen and evaluated the patient, evaluated laboratory and imaging results, formulated the assessment and plan and placed orders. The Patient requires high complexity decision making with multiple systems involvement.  Rounds were done with the Respiratory Therapy Director and Staff therapists and discussed with nursing staff also.  Allyne Gee, MD [facility] Pulmonary Critical Care Medicine Sleep Medicine

## 2022-05-14 ENCOUNTER — Other Ambulatory Visit (HOSPITAL_COMMUNITY): Payer: Self-pay

## 2022-05-14 DIAGNOSIS — G1221 Amyotrophic lateral sclerosis: Secondary | ICD-10-CM | POA: Diagnosis not present

## 2022-05-14 DIAGNOSIS — A419 Sepsis, unspecified organism: Secondary | ICD-10-CM | POA: Diagnosis not present

## 2022-05-14 DIAGNOSIS — G909 Disorder of the autonomic nervous system, unspecified: Secondary | ICD-10-CM | POA: Diagnosis not present

## 2022-05-14 DIAGNOSIS — Z931 Gastrostomy status: Secondary | ICD-10-CM | POA: Diagnosis not present

## 2022-05-14 DIAGNOSIS — J151 Pneumonia due to Pseudomonas: Secondary | ICD-10-CM | POA: Diagnosis not present

## 2022-05-14 DIAGNOSIS — R6521 Severe sepsis with septic shock: Secondary | ICD-10-CM | POA: Diagnosis not present

## 2022-05-14 DIAGNOSIS — K6389 Other specified diseases of intestine: Secondary | ICD-10-CM | POA: Diagnosis not present

## 2022-05-14 DIAGNOSIS — J9621 Acute and chronic respiratory failure with hypoxia: Secondary | ICD-10-CM | POA: Diagnosis not present

## 2022-05-14 NOTE — Progress Notes (Signed)
Pulmonary Critical Care Medicine [place]   PULMONARY CRITICAL CARE SERVICE  PROGRESS NOTE     Jerry [name]  [id number]  DOB: [birth year]   DOA: 01/08/2022  Referring Physician: Satira Sark, MD  HPI: Jerry [name] is a 51 y.o. male being followed for ventilator/airway/oxygen weaning Acute on Chronic Respiratory Failure.  Patient is currently on assist control mode has been on 28% FiO2 PEEP 5  Medications: Reviewed on Rounds  Physical Exam:  Vitals: Temperature is 98.0 pulse of 73 respiratory 23 blood pressure is 124/71 saturations 99%  Ventilator Settings on assist control FiO2 is 28% tidal volume 500 PEEP 5  General: Comfortable at this time Neck: supple Cardiovascular: no malignant arrhythmias Respiratory: Scattered rhonchi expansion is equal Skin: no rash seen on limited exam Musculoskeletal: No gross abnormality Psychiatric:unable to assess Neurologic:no involuntary movements         Lab Data:   Basic Metabolic Panel: Recent Labs  Lab 05/08/22 0116 05/10/22 0143 05/13/22 0149  NA 137 137  --   K 3.7 3.7  --   CL 108 109  --   CO2 23 21*  --   GLUCOSE 134* 103*  --   BUN 19 18  --   CREATININE <0.30* <0.30*  --   CALCIUM 9.4 9.4  --   MG 1.9 1.9 2.0  PHOS  --  3.1 2.9    ABG: No results for input(s): "PHART", "PCO2ART", "PO2ART", "HCO3", "O2SAT" in the last 168 hours.  Liver Function Tests: No results for input(s): "AST", "ALT", "ALKPHOS", "BILITOT", "PROT", "ALBUMIN" in the last 168 hours. No results for input(s): "LIPASE", "AMYLASE" in the last 168 hours. No results for input(s): "AMMONIA" in the last 168 hours.  CBC: Recent Labs  Lab 05/08/22 0116 05/10/22 0143  WBC 6.7 6.7  HGB 9.2* 10.8*  HCT 30.0* 33.9*  MCV 86.2 84.8  PLT 175 156    Cardiac Enzymes: No results for input(s): "CKTOTAL", "CKMB", "CKMBINDEX", "TROPONINI" in the last 168 hours.  BNP (last 3 results) No results for input(s): "BNP" in the  last 8760 hours.  ProBNP (last 3 results) No results for input(s): "PROBNP" in the last 8760 hours.  Radiological Exams: No results found.  Assessment/Plan Active Problems:   ALS (amyotrophic lateral sclerosis) (HCC)   Septic shock (HCC)   Pseudomonas pneumonia (HCC)   Autonomic dysfunction   Acute on chronic respiratory failure with hypoxia (HCC)   Acute on chronic respiratory failure hypoxia we will continue with assist control mode titrate oxygen as tolerated continue pulmonary toilet Sepsis with shock hemodynamics are stable Pseudomonas pneumonia has been treated Autonomic dysfunction no change ALS advanced disease   I have personally seen and evaluated the patient, evaluated laboratory and imaging results, formulated the assessment and plan and placed orders. The Patient requires high complexity decision making with multiple systems involvement.  Rounds were done with the Respiratory Therapy Director and Staff therapists and discussed with nursing staff also.  Allyne Gee, MD [facility] Pulmonary Critical Care Medicine Sleep Medicine

## 2022-05-15 DIAGNOSIS — J9621 Acute and chronic respiratory failure with hypoxia: Secondary | ICD-10-CM | POA: Diagnosis not present

## 2022-05-15 DIAGNOSIS — A419 Sepsis, unspecified organism: Secondary | ICD-10-CM | POA: Diagnosis not present

## 2022-05-15 DIAGNOSIS — R6521 Severe sepsis with septic shock: Secondary | ICD-10-CM | POA: Diagnosis not present

## 2022-05-15 DIAGNOSIS — J151 Pneumonia due to Pseudomonas: Secondary | ICD-10-CM | POA: Diagnosis not present

## 2022-05-15 DIAGNOSIS — G1221 Amyotrophic lateral sclerosis: Secondary | ICD-10-CM | POA: Diagnosis not present

## 2022-05-15 DIAGNOSIS — G909 Disorder of the autonomic nervous system, unspecified: Secondary | ICD-10-CM | POA: Diagnosis not present

## 2022-05-15 NOTE — Progress Notes (Signed)
Pulmonary Critical Care Medicine [place]   PULMONARY CRITICAL CARE SERVICE  PROGRESS NOTE     Jerry [name]  [id number]  DOB: 08/15/1970   DOA: 01/08/2022  Referring Physician: Satira Sark, MD  HPI: Jerry [name] is a 51 y.o. male being followed for ventilator/airway/oxygen weaning Acute on Chronic Respiratory Failure.  Patient is on assist control mode currently on 28% FiO2 saturations are good  Medications: Reviewed on Rounds  Physical Exam:  Vitals: Temperature is 97.9 pulse 76 respiratory 24 blood pressure 118/63 saturation 99%  Ventilator Settings on assist control FiO2 28% tidal line 500 PEEP of 5  General: Comfortable at this time Neck: supple Cardiovascular: no malignant arrhythmias Respiratory: No rhonchi no rales are noted at this time Skin: no rash seen on limited exam Musculoskeletal: No gross abnormality Psychiatric:unable to assess Neurologic:no involuntary movements         Lab Data:   Basic Metabolic Panel: Recent Labs  Lab 05/10/22 0143 05/13/22 0149  NA 137  --   K 3.7  --   CL 109  --   CO2 21*  --   GLUCOSE 103*  --   BUN 18  --   CREATININE <0.30*  --   CALCIUM 9.4  --   MG 1.9 2.0  PHOS 3.1 2.9    ABG: No results for input(s): "PHART", "PCO2ART", "PO2ART", "HCO3", "O2SAT" in the last 168 hours.  Liver Function Tests: No results for input(s): "AST", "ALT", "ALKPHOS", "BILITOT", "PROT", "ALBUMIN" in the last 168 hours. No results for input(s): "LIPASE", "AMYLASE" in the last 168 hours. No results for input(s): "AMMONIA" in the last 168 hours.  CBC: Recent Labs  Lab 05/10/22 0143  WBC 6.7  HGB 10.8*  HCT 33.9*  MCV 84.8  PLT 156    Cardiac Enzymes: No results for input(s): "CKTOTAL", "CKMB", "CKMBINDEX", "TROPONINI" in the last 168 hours.  BNP (last 3 results) No results for input(s): "BNP" in the last 8760 hours.  ProBNP (last 3 results) No results for input(s): "PROBNP" in the last 8760  hours.  Radiological Exams: DG Abd 1 View  Result Date: 05/14/2022 CLINICAL DATA:  Ileus EXAM: ABDOMEN - 1 VIEW COMPARISON:  Abdominal radiograph dated May 10, 2022 FINDINGS: Gastrostomy tube in place. Numerous gas-filled dilated loops of bowel with slightly decreased distension when compared with prior exam. Gas-filled loop of colon in the right hemiabdomen measures 7.9 cm, previously 10.4 cm. Left-greater-than-right basilar heterogeneous lung opacities. IMPRESSION: Decreased gaseous distension when compared with prior exam, likely due to improving ileus. Electronically Signed   By: Yetta Glassman M.D.   On: 05/14/2022 10:00    Assessment/Plan Active Problems:   ALS (amyotrophic lateral sclerosis) (HCC)   Septic shock (HCC)   Pseudomonas pneumonia (HCC)   Autonomic dysfunction   Acute on chronic respiratory failure with hypoxia (HCC)   Acute on chronic respiratory failure hypoxia plan is to continue with full support patient currently is on assist control mode patient is at baseline not able to wean Septic shock resolved hemodynamics are stable ALS advanced severe disease at baseline Pseudomonas pneumonia has been treated patient has no fevers noted Autonomic dysfunction appears to be stable   I have personally seen and evaluated the patient, evaluated laboratory and imaging results, formulated the assessment and plan and placed orders. The Patient requires high complexity decision making with multiple systems involvement.  Rounds were done with the Respiratory Therapy Director and Staff therapists and discussed with nursing staff also.  Ledon Weihe A  Humphrey Rolls, MD [facility] Pulmonary Critical Care Medicine Sleep Medicine

## 2022-05-16 DIAGNOSIS — A419 Sepsis, unspecified organism: Secondary | ICD-10-CM | POA: Diagnosis not present

## 2022-05-16 DIAGNOSIS — J9621 Acute and chronic respiratory failure with hypoxia: Secondary | ICD-10-CM | POA: Diagnosis not present

## 2022-05-16 DIAGNOSIS — E119 Type 2 diabetes mellitus without complications: Secondary | ICD-10-CM | POA: Diagnosis not present

## 2022-05-16 DIAGNOSIS — R1310 Dysphagia, unspecified: Secondary | ICD-10-CM | POA: Diagnosis not present

## 2022-05-16 DIAGNOSIS — G909 Disorder of the autonomic nervous system, unspecified: Secondary | ICD-10-CM | POA: Diagnosis not present

## 2022-05-16 DIAGNOSIS — J9622 Acute and chronic respiratory failure with hypercapnia: Secondary | ICD-10-CM | POA: Diagnosis not present

## 2022-05-16 DIAGNOSIS — G1221 Amyotrophic lateral sclerosis: Secondary | ICD-10-CM | POA: Diagnosis not present

## 2022-05-16 DIAGNOSIS — J151 Pneumonia due to Pseudomonas: Secondary | ICD-10-CM | POA: Diagnosis not present

## 2022-05-16 DIAGNOSIS — R6521 Severe sepsis with septic shock: Secondary | ICD-10-CM | POA: Diagnosis not present

## 2022-05-16 LAB — BASIC METABOLIC PANEL
Anion gap: 7 (ref 5–15)
BUN: 16 mg/dL (ref 6–20)
CO2: 21 mmol/L — ABNORMAL LOW (ref 22–32)
Calcium: 9.4 mg/dL (ref 8.9–10.3)
Chloride: 110 mmol/L (ref 98–111)
Creatinine, Ser: <0.3 mg/dL — ABNORMAL LOW (ref 0.61–1.24)
Glucose, Bld: 131 mg/dL — ABNORMAL HIGH (ref 70–99)
Potassium: 3.7 mmol/L (ref 3.5–5.1)
Sodium: 138 mmol/L (ref 135–145)

## 2022-05-16 LAB — CBC
HCT: 29.8 % — ABNORMAL LOW (ref 39.0–52.0)
Hemoglobin: 9.5 g/dL — ABNORMAL LOW (ref 13.0–17.0)
MCH: 27.1 pg (ref 26.0–34.0)
MCHC: 31.9 g/dL (ref 30.0–36.0)
MCV: 84.9 fL (ref 80.0–100.0)
Platelets: 194 10*3/uL (ref 150–400)
RBC: 3.51 MIL/uL — ABNORMAL LOW (ref 4.22–5.81)
RDW: 17.8 % — ABNORMAL HIGH (ref 11.5–15.5)
WBC: 6.5 10*3/uL (ref 4.0–10.5)
nRBC: 0 % (ref 0.0–0.2)

## 2022-05-16 LAB — PHOSPHORUS: Phosphorus: 3.4 mg/dL (ref 2.5–4.6)

## 2022-05-16 LAB — MAGNESIUM: Magnesium: 2 mg/dL (ref 1.7–2.4)

## 2022-05-16 NOTE — Progress Notes (Signed)
PROGRESS NOTE    Jerry [name]  HAL:[id number] DOB: 10-13-1970 DOA: 01/08/2022  Brief Narrative:  Jerry [name] is an 51 y.o. male with medical history significant for ALS who presented to [facility] ED on 12/09/2021 via EMS after being found by family to be altered, less responsive, oxygen saturation dropping into the 60s.  Patient is followed by [facility].  Unfortunately ALS progressed to the point where he is wheelchair-bound in addition to worsening respiratory distress.  Here he had PEG tube placement.  On arrival to the emergency department he had to be intubated immediately, he had a brief cardiac arrest in the ED at the time of intubation with recurrent cardiac arrest in the ICU.  At the acute facility patient had to be placed on vasopressors.  He had bronchoscopy done on 12/18/2021 which showed thick mucus removed from right lower lobe.  Cultures showed Pseudomonas.  He was treated with azithromycin, inhaled tobramycin.  He failed weaning trials. Once patient stabilized he was transferred and admitted to [facility].  He has had multiple episodes of pneumonia and tracheobronchitis and received treatment with multiple rounds of antibiotics.  Unfortunately has ongoing aspiration due to the ALS.  Assessment & Plan:  Active Problems: Acute on chronic respiratory failure with hypoxia/hypercapnia, vent dependent Pneumonia with Pseudomonas ALS (amyotrophic lateral sclerosis) Persistent ileus  Autonomic dysfunction Diabetes mellitus type 2 Dysphagia/protein calorie malnutrition    Acute on chronic hypoxemic and hypercarbic respiratory failure secondary to progressive neuromuscular disease/ALS/ventilator dependent respiratory failure: Patient presented to acute facility via EMS after being found altered, with SPO2 in the 60s.  Etiology likely secondary to progressive neuromuscular disease with underlying ALS.  Patient was intubated on admission and likely now vent  dependent given progression of ALS. He underwent tracheostomy placement on 12/14/2021.  Unfortunately due to the chronic trach he is high risk for recurrent tracheobronchitis and recurrent pneumonia despite being on antibiotics.  He received treatment with multiple rounds of antibiotics.  Recent respiratory cultures again showing Pseudomonas.  Has been started on cefepime.  Plan to treat for duration of 7-10 days pending improvement.  If his respiratory status worsens, recommend to repeat chest imaging preferably chest CT which can be done without contrast to better evaluate.  However, unfortunately he remains high risk for recurrent pneumonia despite antibiotic treatment due to high suspicion for ongoing aspiration.   Pneumonia with Pseudomonas: -Septic shock at the acute facility.  Currently shock resolved. -He underwent bronchoscopy at the acute facility with cultures showing Pseudomonas.  He received treatment with multiple rounds of antibiotics and tobramycin nebulizers.  Recent respiratory cultures again showing Pseudomonas.  Has been started on cefepime.  Plan to treat for duration of 7-10 days.  Please monitor CBC, BMP while on antibiotics.  If not improving, recommend to add tobramycin nebulizers.  Unfortunately he is at risk for recurrent aspiration, recurrent tracheobronchitis and recurrent pneumonia despite antibiotic treatment.   Progressive ALS -Patient has been bedbound for the last year currently vent dependent.  Followed by [facility]. -Continue trach and PEG care   Autonomic dysfunction -Etiology likely secondary to his underlying progressive ALS.  Continue to monitor.  Further management per the primary team.   Diabetes mellitus type 2: Continue medications and management per the primary team.   Dysphagia/protein calorie malnutrition: Due to his dysphagia he is high risk for recurrent aspiration and aspiration pneumonia despite being on antibiotics.  Management of  PCM per the primary team.  Unfortunately due to his complex medical problems he is high risk for worsening and decompensation.  Recommend to continue discussion regarding goals of care.  Plan of care discussed with the primary team and pharmacy.  Subjective: Ill-appearing, continues to be vent dependent.  Has some secretions through the tracheostomy.  Respiratory cultures from 05/10/2022 again showing Pseudomonas.  Objective: Vitals: Temperature 97.7, pulse 83, respiratory 20, blood pressure 120/70, oxygen saturation 100%  Examination: Constitutional: Ill-appearing male Eyes: PERLA, EOMI  ENMT: external ears and nose appear normal, normal hearing, moist oral mucosa Neck: Has trach in place  CVS: S1-S2 Respiratory: Coarse breath sounds with scattered rhonchi  Abdomen: soft nontender, mild distention, diminished bowel sounds, PEG tube in place Musculoskeletal: Right upper extremity swelling, mild lower extremity edema Neuro: Unable to move extremities Psych: not agitated  skin: no rashes  Data Reviewed: I have personally reviewed following labs and imaging studies  CBC: Recent Labs  Lab 05/10/22 0143 05/16/22 0052  WBC 6.7 6.5  HGB 10.8* 9.5*  HCT 33.9* 29.8*  MCV 84.8 84.9  PLT 156 563    Basic Metabolic Panel: Recent Labs  Lab 05/10/22 0143 05/13/22 0149 05/16/22 0052  NA 137  --  138  K 3.7  --  3.7  CL 109  --  110  CO2 21*  --  21*  GLUCOSE 103*  --  131*  BUN 18  --  16  CREATININE <0.30*  --  <0.30*  CALCIUM 9.4  --  9.4  MG 1.9 2.0 2.0  PHOS 3.1 2.9 3.4    GFR: CrCl cannot be calculated (This lab value cannot be used to calculate CrCl because it is not a number: <0.30).  Liver Function Tests: No results for input(s): "AST", "ALT", "ALKPHOS", "BILITOT", "PROT", "ALBUMIN" in the last 168 hours.  CBG: No results for input(s): "GLUCAP" in the last 168 hours.   Recent Results (from the past 240 hour(s))  Urine Culture     Status: None   Collection  Time: 05/07/22  2:24 AM   Specimen: Urine, Clean Catch  Result Value Ref Range Status   Specimen Description URINE, CLEAN CATCH  Final   Special Requests NONE  Final   Culture   Final    NO GROWTH Performed at [facility], [address], [place], [place] [address]    Report Status 05/09/2022 FINAL  Final  Culture, blood (Routine X 2) w Reflex to ID Panel     Status: None   Collection Time: 05/07/22  3:08 AM   Specimen: BLOOD  Result Value Ref Range Status   Specimen Description BLOOD LEFT ANTECUBITAL  Final   Special Requests   Final    BOTTLES DRAWN AEROBIC AND ANAEROBIC Blood Culture adequate volume   Culture   Final    NO GROWTH 5 DAYS Performed at [facility], [place] [address], [place], [place] [address]    Report Status 05/12/2022 FINAL  Final  Culture, blood (Routine X 2) w Reflex to ID Panel     Status: None   Collection Time: 05/07/22  3:13 AM   Specimen: BLOOD LEFT WRIST  Result Value Ref Range Status   Specimen Description BLOOD LEFT WRIST  Final   Special Requests   Final    BOTTLES DRAWN AEROBIC AND ANAEROBIC Blood Culture adequate volume   Culture   Final    NO GROWTH 5 DAYS Performed at [facility], [place] [address] [place], [place] [address]  Report Status 05/12/2022 FINAL  Final  Culture, Respiratory w Gram Stain     Status: None   Collection Time: 05/07/22  7:39 AM   Specimen: Tracheal Aspirate; Respiratory  Result Value Ref Range Status   Specimen Description TRACHEAL ASPIRATE  Final   Special Requests NONE  Final   Gram Stain   Final    RARE WBC PRESENT, PREDOMINANTLY PMN FEW GRAM POSITIVE RODS INTRACELLULAR Performed at [facility], [place] [address] [place], [place] [address]    Culture FEW PSEUDOMONAS AERUGINOSA  Final   Report Status 05/10/2022 FINAL  Final   Organism ID, Bacteria PSEUDOMONAS AERUGINOSA  Final      Susceptibility   Pseudomonas aeruginosa - MIC*    CEFTAZIDIME 4 SENSITIVE Sensitive     CIPROFLOXACIN  <=0.25 SENSITIVE Sensitive     GENTAMICIN <=1 SENSITIVE Sensitive     IMIPENEM 2 SENSITIVE Sensitive     CEFEPIME 4 SENSITIVE Sensitive     * FEW PSEUDOMONAS AERUGINOSA    Radiology Studies: No results found.  Scheduled Meds: Please see MAR  Yaakov Guthrie, MD  05/16/2022, 11:04 PM

## 2022-05-16 NOTE — Progress Notes (Signed)
Pulmonary Critical Care Medicine Jerry [name]   PULMONARY CRITICAL CARE SERVICE  PROGRESS NOTE     Jerry [name]  NTI:[id number]  DOB: June 14, 1971   DOA: 01/08/2022  Referring Physician: Satira Sark, MD  HPI: Jerry [name] is a 51 y.o. male being followed for ventilator/airway/oxygen weaning Acute on Chronic Respiratory Failure.  Patient is on assist control mode has been on 28% FiO2 with good saturations  Medications: Reviewed on Rounds  Physical Exam:  Vitals: Temperature is 97.7 pulse of 83 respiratory rate 20 blood pressure 120/70 saturations 100%  Ventilator Settings assist-control FiO2 is 28% tidal volume 500 PEEP of 5  General: Comfortable at this time Neck: supple Cardiovascular: no malignant arrhythmias Respiratory: No rhonchi no rales are noted at this time Skin: no rash seen on limited exam Musculoskeletal: No gross abnormality Psychiatric:unable to assess Neurologic:no involuntary movements         Lab Data:   Basic Metabolic Panel: Recent Labs  Lab 05/10/22 0143 05/13/22 0149 05/16/22 0052  NA 137  --  138  K 3.7  --  3.7  CL 109  --  110  CO2 21*  --  21*  GLUCOSE 103*  --  131*  BUN 18  --  16  CREATININE <0.30*  --  <0.30*  CALCIUM 9.4  --  9.4  MG 1.9 2.0 2.0  PHOS 3.1 2.9 3.4    ABG: No results for input(s): "PHART", "PCO2ART", "PO2ART", "HCO3", "O2SAT" in the last 168 hours.  Liver Function Tests: No results for input(s): "AST", "ALT", "ALKPHOS", "BILITOT", "PROT", "ALBUMIN" in the last 168 hours. No results for input(s): "LIPASE", "AMYLASE" in the last 168 hours. No results for input(s): "AMMONIA" in the last 168 hours.  CBC: Recent Labs  Lab 05/10/22 0143 05/16/22 0052  WBC 6.7 6.5  HGB 10.8* 9.5*  HCT 33.9* 29.8*  MCV 84.8 84.9  PLT 156 194    Cardiac Enzymes: No results for input(s): "CKTOTAL", "CKMB", "CKMBINDEX", "TROPONINI" in the last 168 hours.  BNP (last 3 results) No results for input(s):  "BNP" in the last 8760 hours.  ProBNP (last 3 results) No results for input(s): "PROBNP" in the last 8760 hours.  Radiological Exams: No results found.  Assessment/Plan Active Problems:   ALS (amyotrophic lateral sclerosis) (HCC)   Septic shock (HCC)   Pseudomonas pneumonia (HCC)   Autonomic dysfunction   Acute on chronic respiratory failure with hypoxia (HCC)   Acute on chronic respiratory failure hypoxia we will continue with full support patient is on assist control on 28% FiO2 tidal volume is 500 with a PEEP 5 Sepsis with shock treated resolved hemodynamics are stable Pseudomonas pneumonia treated clinically is improved Autonomic dysfunction at baseline ALS severe disease we will continue to monitor   I have personally seen and evaluated the patient, evaluated laboratory and imaging results, formulated the assessment and plan and placed orders. The Patient requires high complexity decision making with multiple systems involvement.  Rounds were done with the Respiratory Therapy Director and Staff therapists and discussed with nursing staff also.  Allyne Gee, MD [facility] Pulmonary Critical Care Medicine Sleep Medicine

## 2022-05-17 ENCOUNTER — Other Ambulatory Visit (HOSPITAL_COMMUNITY): Payer: Self-pay

## 2022-05-17 ENCOUNTER — Telehealth: Payer: Self-pay

## 2022-05-17 DIAGNOSIS — K6389 Other specified diseases of intestine: Secondary | ICD-10-CM | POA: Diagnosis not present

## 2022-05-17 NOTE — Progress Notes (Signed)
Pulmonary Critical Care Medicine [place]   PULMONARY CRITICAL CARE SERVICE  PROGRESS NOTE     Wilmon Conover  GGE:[id number]  DOB: 04-01-1971   DOA: 01/08/2022  Referring Physician: Satira Sark, MD  HPI: Jerry [name] is a 51 y.o. male being followed for ventilator/airway/oxygen weaning Acute on Chronic Respiratory Failure.  Patient is on the ventilator full support at baseline  Medications: Reviewed on Rounds  Physical Exam:  Vitals: Temperature 97.7 pulse of 81 respiratory 21 blood pressure 131/96 saturations 99%  Ventilator Settings assist-control FiO2 28% tidal volume 500 PEEP of 5  General: Comfortable at this time Neck: supple Cardiovascular: no malignant arrhythmias Respiratory: Coarse rhonchi Skin: no rash seen on limited exam Musculoskeletal: No gross abnormality Psychiatric:unable to assess Neurologic:no involuntary movements         Lab Data:   Basic Metabolic Panel: Recent Labs  Lab 05/13/22 0149 05/16/22 0052  NA  --  138  K  --  3.7  CL  --  110  CO2  --  21*  GLUCOSE  --  131*  BUN  --  16  CREATININE  --  <0.30*  CALCIUM  --  9.4  MG 2.0 2.0  PHOS 2.9 3.4    ABG: No results for input(s): "PHART", "PCO2ART", "PO2ART", "HCO3", "O2SAT" in the last 168 hours.  Liver Function Tests: No results for input(s): "AST", "ALT", "ALKPHOS", "BILITOT", "PROT", "ALBUMIN" in the last 168 hours. No results for input(s): "LIPASE", "AMYLASE" in the last 168 hours. No results for input(s): "AMMONIA" in the last 168 hours.  CBC: Recent Labs  Lab 05/16/22 0052  WBC 6.5  HGB 9.5*  HCT 29.8*  MCV 84.9  PLT 194    Cardiac Enzymes: No results for input(s): "CKTOTAL", "CKMB", "CKMBINDEX", "TROPONINI" in the last 168 hours.  BNP (last 3 results) No results for input(s): "BNP" in the last 8760 hours.  ProBNP (last 3 results) No results for input(s): "PROBNP" in the last 8760 hours.  Radiological Exams: No results  found.  Assessment/Plan Active Problems:   ALS (amyotrophic lateral sclerosis) (HCC)   Septic shock (HCC)   Pseudomonas pneumonia (HCC)   Autonomic dysfunction   Acute on chronic respiratory failure with hypoxia (HCC)   Acute on chronic respiratory failure hypoxia patient is at baseline assist-control FiO2 28% tidal volume 500 PEEP of 5 Severe sepsis with shock resolved hemodynamics are stable Pseudomonas pneumonia has been treated with antibiotics improved Autonomic dysfunction patient is at baseline ALS severe advanced disease at baseline   I have personally seen and evaluated the patient, evaluated laboratory and imaging results, formulated the assessment and plan and placed orders. The Patient requires high complexity decision making with multiple systems involvement.  Rounds were done with the Respiratory Therapy Director and Staff therapists and discussed with nursing staff also.  Allyne Gee, MD [facility] Pulmonary Critical Care Medicine Sleep Medicine

## 2022-05-17 NOTE — Telephone Encounter (Signed)
   Care Management     05/17/2022 Name: Arihaan Bellucci MRN: [id number] DOB: 1970/09/23  Subjective: Kiyoshi Schaab is a 51 y.o. year old male who is a primary care patient of [name], MD. The patient was referred to the Chronic Care Management team for assistance with care management needs subsequent to provider initiation of CCM services and plan of care.         Plan:

## 2022-05-18 NOTE — Progress Notes (Signed)
Pulmonary Critical Care Medicine [place]   PULMONARY CRITICAL CARE SERVICE  PROGRESS NOTE     Jerry [name]  DDU:[id number]  DOB: 07-01-1971   DOA: 01/08/2022  Referring Physician: Satira Sark, MD  HPI: Jerry [name] is a 51 y.o. male being followed for ventilator/airway/oxygen weaning Acute on Chronic Respiratory Failure.  Patient is on the ventilator full support has concerns about ileus once again  Medications: Reviewed on Rounds  Physical Exam:  Vitals: Temperature is 97.1 pulse 67 respiratory 17 blood pressure 113/73 saturations 98%  Ventilator Settings on assist control mode FiO2 28% tolerating 500 PEEP 5  General: Comfortable at this time Neck: supple Cardiovascular: no malignant arrhythmias Respiratory: Scattered rhonchi expansion equal Skin: no rash seen on limited exam Musculoskeletal: No gross abnormality Psychiatric:unable to assess Neurologic:no involuntary movements         Lab Data:   Basic Metabolic Panel: Recent Labs  Lab 05/13/22 0149 05/16/22 0052  NA  --  138  K  --  3.7  CL  --  110  CO2  --  21*  GLUCOSE  --  131*  BUN  --  16  CREATININE  --  <0.30*  CALCIUM  --  9.4  MG 2.0 2.0  PHOS 2.9 3.4    ABG: No results for input(s): "PHART", "PCO2ART", "PO2ART", "HCO3", "O2SAT" in the last 168 hours.  Liver Function Tests: No results for input(s): "AST", "ALT", "ALKPHOS", "BILITOT", "PROT", "ALBUMIN" in the last 168 hours. No results for input(s): "LIPASE", "AMYLASE" in the last 168 hours. No results for input(s): "AMMONIA" in the last 168 hours.  CBC: Recent Labs  Lab 05/16/22 0052  WBC 6.5  HGB 9.5*  HCT 29.8*  MCV 84.9  PLT 194    Cardiac Enzymes: No results for input(s): "CKTOTAL", "CKMB", "CKMBINDEX", "TROPONINI" in the last 168 hours.  BNP (last 3 results) No results for input(s): "BNP" in the last 8760 hours.  ProBNP (last 3 results) No results for input(s): "PROBNP" in the last 8760  hours.  Radiological Exams: DG Abd 1 View  Result Date: 05/17/2022 CLINICAL DATA:  Ileus EXAM: ABDOMEN - 1 VIEW COMPARISON:  Portable exam 0914 hours compared to 05/14/2022 FINDINGS: Air-filled loops of large and small bowel throughout abdomen. Decreased colonic distention versus previous exam. G-tube noted. No bowel wall thickening or obstruction. Osseous structures unremarkable. IMPRESSION: Nonobstructive bowel gas pattern with decreased colonic distension since previous study. Electronically Signed   By: Lavonia Dana M.D.   On: 05/17/2022 09:28    Assessment/Plan Active Problems:   ALS (amyotrophic lateral sclerosis) (HCC)   Septic shock (HCC)   Pseudomonas pneumonia (HCC)   Autonomic dysfunction   Acute on chronic respiratory failure with hypoxia (HCC)   Acute on chronic respiratory failure hypoxia we will continue with full support on assist control mode patient has been stable on the current settings and is not able to wean ALS severe advanced disease no change Autonomic dysfunction likely contributing to the ileus Pseudomonas pneumonia has been treated we will continue to monitor Sepsis with shock treated resolved hemodynamics are stable   I have personally seen and evaluated the patient, evaluated laboratory and imaging results, formulated the assessment and plan and placed orders. The Patient requires high complexity decision making with multiple systems involvement.  Rounds were done with the Respiratory Therapy Director and Staff therapists and discussed with nursing staff also.  Allyne Gee, MD [facility] Pulmonary Critical Care Medicine Sleep Medicine

## 2022-05-19 ENCOUNTER — Encounter (HOSPITAL_COMMUNITY): Payer: Self-pay | Admitting: Emergency Medicine

## 2022-05-19 ENCOUNTER — Other Ambulatory Visit: Payer: Self-pay

## 2022-05-19 ENCOUNTER — Inpatient Hospital Stay
Admission: AD | Admit: 2022-05-19 | Discharge: 2022-06-15 | Disposition: A | Discharge status: Home / Self Care | Payer: Medicare HMO | Source: Other Acute Inpatient Hospital

## 2022-05-19 ENCOUNTER — Emergency Department (HOSPITAL_COMMUNITY)
Admission: EM | Admit: 2022-05-19 | Discharge: 2022-05-19 | Disposition: A | Discharge status: Skilled Nursing Facility | Payer: Medicare HMO | Attending: Emergency Medicine | Admitting: Emergency Medicine

## 2022-05-19 DIAGNOSIS — G1221 Amyotrophic lateral sclerosis: Secondary | ICD-10-CM | POA: Diagnosis present

## 2022-05-19 DIAGNOSIS — J151 Pneumonia due to Pseudomonas: Secondary | ICD-10-CM | POA: Diagnosis present

## 2022-05-19 DIAGNOSIS — R140 Abdominal distension (gaseous): Secondary | ICD-10-CM | POA: Diagnosis not present

## 2022-05-19 DIAGNOSIS — E43 Unspecified severe protein-calorie malnutrition: Secondary | ICD-10-CM

## 2022-05-19 DIAGNOSIS — I469 Cardiac arrest, cause unspecified: Secondary | ICD-10-CM | POA: Diagnosis present

## 2022-05-19 DIAGNOSIS — J9621 Acute and chronic respiratory failure with hypoxia: Secondary | ICD-10-CM | POA: Diagnosis present

## 2022-05-19 DIAGNOSIS — K567 Ileus, unspecified: Secondary | ICD-10-CM | POA: Diagnosis not present

## 2022-05-19 DIAGNOSIS — G909 Disorder of the autonomic nervous system, unspecified: Secondary | ICD-10-CM | POA: Diagnosis present

## 2022-05-19 LAB — COMPREHENSIVE METABOLIC PANEL
ALT: 174 U/L — ABNORMAL HIGH (ref 0–44)
AST: 79 U/L — ABNORMAL HIGH (ref 15–41)
Albumin: 3.2 g/dL — ABNORMAL LOW (ref 3.5–5.0)
Alkaline Phosphatase: 130 U/L — ABNORMAL HIGH (ref 38–126)
Anion gap: 6 (ref 5–15)
BUN: 20 mg/dL (ref 6–20)
CO2: 23 mmol/L (ref 22–32)
Calcium: 9.4 mg/dL (ref 8.9–10.3)
Chloride: 109 mmol/L (ref 98–111)
Creatinine, Ser: <0.3 mg/dL — ABNORMAL LOW (ref 0.61–1.24)
Glucose, Bld: 141 mg/dL — ABNORMAL HIGH (ref 70–99)
Potassium: 3.8 mmol/L (ref 3.5–5.1)
Sodium: 138 mmol/L (ref 135–145)
Total Bilirubin: 0.4 mg/dL (ref 0.3–1.2)
Total Protein: 7.8 g/dL (ref 6.5–8.1)

## 2022-05-19 LAB — CBC WITH DIFFERENTIAL/PLATELET
Abs Immature Granulocytes: 0.02 10*3/uL (ref 0.00–0.07)
Basophils Absolute: 0 10*3/uL (ref 0.0–0.1)
Basophils Relative: 0 %
Eosinophils Absolute: 0.3 10*3/uL (ref 0.0–0.5)
Eosinophils Relative: 4 %
HCT: 31.4 % — ABNORMAL LOW (ref 39.0–52.0)
Hemoglobin: 9.5 g/dL — ABNORMAL LOW (ref 13.0–17.0)
Immature Granulocytes: 0 %
Lymphocytes Relative: 17 %
Lymphs Abs: 1.2 10*3/uL (ref 0.7–4.0)
MCH: 26.6 pg (ref 26.0–34.0)
MCHC: 30.3 g/dL (ref 30.0–36.0)
MCV: 88 fL (ref 80.0–100.0)
Monocytes Absolute: 0.5 10*3/uL (ref 0.1–1.0)
Monocytes Relative: 7 %
Neutro Abs: 5 10*3/uL (ref 1.7–7.7)
Neutrophils Relative %: 72 %
Platelets: 199 10*3/uL (ref 150–400)
RBC: 3.57 MIL/uL — ABNORMAL LOW (ref 4.22–5.81)
RDW: 17.4 % — ABNORMAL HIGH (ref 11.5–15.5)
WBC: 7 10*3/uL (ref 4.0–10.5)
nRBC: 0 % (ref 0.0–0.2)

## 2022-05-19 LAB — LIPASE, BLOOD: Lipase: 25 U/L (ref 11–51)

## 2022-05-19 LAB — PHOSPHORUS: Phosphorus: 3.2 mg/dL (ref 2.5–4.6)

## 2022-05-19 LAB — TRIGLYCERIDES: Triglycerides: <10 mg/dL (ref <150)

## 2022-05-19 LAB — MAGNESIUM: Magnesium: 1.9 mg/dL (ref 1.7–2.4)

## 2022-05-19 NOTE — ED Triage Notes (Signed)
Pt here from [facility], for consult related to ileus. Hx ALS, trach/vent dependent and PICC line.

## 2022-05-19 NOTE — ED Notes (Signed)
Called respiratory to check pts trach per wifes request

## 2022-05-19 NOTE — ED Provider Notes (Signed)
[facility] EMERGENCY DEPARTMENT Provider Note   CSN: [id number] Arrival date & time: 05/19/22  8315     History  No chief complaint on file.   Jerry [name] is a 51 y.o. male.  He was sent here from [facility] for evaluation of ileus.  51 year old male with ALS currently vent dependent trach PEG PICC line.  He is getting TPN feedings.  Reportedly having trouble with ileus over the last month or more.  Currently PEG is vented and receiving TPN nutrition.  He is here for ? surgical consultation.  Recent Pneumonia, now off abx for ?1 week. Patient is nonverbal and unable to move but can blink yes or no and uses a screen to type out communication.  Currently he says he is not having any difficulty breathing and denies abdominal pain.  On review of notes from [facility] patient had recent Pseudomonas pneumonia and sepsis.  The history is provided by a caregiver and a relative. The history is limited by the condition of the patient.       Home Medications Prior to Admission medications   Medication Sig Start Date End Date Taking? Authorizing Provider  Baclofen 5 MG TABS Place 5 mg into feeding tube 3 (three) times daily as needed for muscle spasms. 01/08/22   Dessa Phi, DO  bisacodyl (DULCOLAX) 10 MG suppository Place 10 mg rectally daily as needed for severe constipation. 09/18/21   [provider]  enoxaparin (LOVENOX) 40 MG/0.4ML injection Inject 0.4 mLs (40 mg total) into the skin daily. DVT prophylaxis 01/08/22   Dessa Phi, DO  hydrOXYzine (ATARAX) 25 MG tablet Place 1 tablet (25 mg total) into feeding tube 2 (two) times daily as needed for anxiety. 01/08/22   Dessa Phi, DO  hydrOXYzine (ATARAX) 50 MG tablet Place 1 tablet (50 mg total) into feeding tube at bedtime. 01/08/22   Dessa Phi, DO  melatonin 5 MG TABS Place 1 tablet (5 mg total) into feeding tube at bedtime. 01/08/22   Dessa Phi, DO  Nutritional Supplements (FEEDING SUPPLEMENT, KATE FARMS  STANDARD 1.4,) LIQD liquid Place 65 mL/hr into feeding tube every 8 (eight) hours. 01/08/22   Dessa Phi, DO  pantoprazole sodium (PROTONIX) 40 mg Place 40 mg into feeding tube daily. 01/08/22   Dessa Phi, DO  polyethylene glycol powder (GLYCOLAX/MIRALAX) 17 GM/SCOOP powder Place 17 g into feeding tube daily as needed for constipation. 07/30/21   [provider]  scopolamine (TRANSDERM-SCOP) 1 MG/3DAYS Place 1 patch (1.5 mg total) onto the skin every 3 (three) days. 01/11/22   Dessa Phi, DO  Sennosides (SENNA) 8.8 MG/5ML LIQD Take 10 mLs by mouth daily as needed for constipation. 07/30/21   [provider]  tobramycin, PF, (TOBI) 300 MG/5ML nebulizer solution Take 5 mLs (300 mg total) by nebulization 2 (two) times daily. 01/08/22   Dessa Phi, DO      Allergies    Otho Darner allergy]    Review of Systems   Review of Systems  Unable to perform ROS: Patient nonverbal  Respiratory:  Negative for shortness of breath.   Gastrointestinal:  Negative for abdominal pain.    Physical Exam Updated Vital Signs BP 110/66   Pulse 80   Temp 97.9 F (36.6 C) (Oral)   Resp 19   SpO2 100%  Physical Exam Vitals and nursing note reviewed.  Constitutional:      General: He is not in acute distress.    Appearance: Normal appearance. He is well-developed.  HENT:  Head: Normocephalic and atraumatic.  Eyes:     Conjunctiva/sclera: Conjunctivae normal.  Neck:     Comments: Trach on vent Cardiovascular:     Rate and Rhythm: Normal rate and regular rhythm.     Heart sounds: No murmur heard. Pulmonary:     Effort: Pulmonary effort is normal. No respiratory distress.     Breath sounds: Rhonchi present.  Abdominal:     Palpations: Abdomen is soft.     Tenderness: There is no abdominal tenderness. There is no guarding or rebound.     Comments: G-tube in place  Musculoskeletal:     Comments: PICC right upper arm no surrounding erythema.  Patient in [place] boots.   Skin:    General: Skin is warm and dry.     Capillary Refill: Capillary refill takes less than 2 seconds.  Neurological:     Mental Status: He is alert.     Comments: Patient has minimal active movement can blink and move jaw a little.  He is awake alert     ED Results / Procedures / Treatments   Labs (all labs ordered are listed, but only abnormal results are displayed) Labs Reviewed  COMPREHENSIVE METABOLIC PANEL - Abnormal; Notable for the following components:      Result Value   Glucose, Bld 141 (*)    Creatinine, Ser <0.30 (*)    Albumin 3.2 (*)    AST 79 (*)    ALT 174 (*)    Alkaline Phosphatase 130 (*)    All other components within normal limits  CBC WITH DIFFERENTIAL/PLATELET - Abnormal; Notable for the following components:   RBC 3.57 (*)    Hemoglobin 9.5 (*)    HCT 31.4 (*)    RDW 17.4 (*)    All other components within normal limits  LIPASE, BLOOD    EKG None  Radiology DG Abd 1 View  Result Date: 05/17/2022 CLINICAL DATA:  Ileus EXAM: ABDOMEN - 1 VIEW COMPARISON:  Portable exam 0914 hours compared to 05/14/2022 FINDINGS: Air-filled loops of large and small bowel throughout abdomen. Decreased colonic distention versus previous exam. G-tube noted. No bowel wall thickening or obstruction. Osseous structures unremarkable. IMPRESSION: Nonobstructive bowel gas pattern with decreased colonic distension since previous study. Electronically Signed   By: Lavonia Dana M.D.   On: 05/17/2022 09:28    Procedures Procedures    Medications Ordered in ED Medications - No data to display  ED Course/ Medical Decision Making/ A&P Clinical Course as of 05/19/22 1659  Wed May 19, 2022  0736 Discussed with general surgery consultant Claiborne Billings PA.  She said that general surgery had not accepted the patient and she feels GI would be the appropriate consult in the setting of ileus without acute obstruction. [MB]  [id number] [facility] at [facility].  She said he has had  longstanding ileus and has been on TPN due to residuals and vomiting.  They do not have access to a GI consultant and so sent him to the ED for GI to evaluate. [MB]  [phone] Discussed with GI PA G[place] who will evaluate patient [MB]  1011 Patient's LFTs are elevated.  Awaiting GI recommendations.  Unclear if this is related to any gallbladder disease or just function of his TPN. [MB]  1156 Patient has been seen by GI.  They are making some recommendations for bowel regiment.  Dr. Hilarie Fredrickson said the patient can go back to facility from his standpoint and he will dictate a consult. [  MB]    Clinical Course User Index [MB] Hayden Rasmussen, MD                           Medical Decision Making Amount and/or Complexity of Data Reviewed Labs: ordered.   This patient complains of ileus nausea vomiting; this involves an extensive number of treatment Options and is a complaint that carries with it a high risk of complications and morbidity. The differential includes ileus, obstruction, Ogilvie's, metabolic derangement  I ordered, reviewed and interpreted labs, which included CBC with normal white count, hemoglobin low stable from priors, chemistries with normal renal function, LFTs elevated Additional history obtained from patient's family members Previous records obtained and reviewed in epic including notes from [facility] I consulted GI Dr. Jerline Pain and discussed lab and imaging findings and discussed disposition.  Cardiac monitoring reviewed, normal sinus rhythm Social determinants considered, patient limited by physical inactivity and difficulty with communication Critical Interventions: None  After the interventions stated above, I reevaluated the patient and found patient to be fairly asymptomatic Admission and further testing considered, no indications for admission or further work-up at this time.  GI has made recommendations and will have patient return to his facility.  Family comfortable  plan for return to facility         Final Clinical Impression(s) / ED Diagnoses Final diagnoses:  Ileus ([place])  ALS (amyotrophic lateral sclerosis) ([place])    Rx / DC Orders ED Discharge Orders     None         Hayden Rasmussen, MD 05/19/22 1701

## 2022-05-19 NOTE — Consult Note (Signed)
[facility] Consult: 8:36 AM 05/19/2022  LOS: 0 days    Referring Provider: Dr Melina Copa MD.    Primary Care Physician:  [name], MD Primary Gastroenterologist:  unassigned.      Reason for Consultation:  ileus in pt w ALS and trach/vent dependent.     HPI: Jerry [name] is a 51 y.o. male.  PMH ALS, dx 2019.  Morbid obesity ().   Adynamic ileus.   Previous suprapubic catheter.  Severe prot cal malnutrition.  S/p PEG 10/2021. Right groin lipoma excision 2016.  6/7-01/08/2022 admission with acute on chronic respiratory failure, Pseudomonas pneumonia, septic shock.  Had a brief cardiac arrest.  Discharged to [place].    Ileus became problematic ~ Aug 2023, and TF discontinued, TPN initiated.  Recent attempts to restart TF, wean TPN as of Oct 20 unsuccessful.  Wife says pt became bloated and pt endorses 5/5 abd pain/discomfort.  No nausea or vomiting.  G tube now t gravity drainage, unable to see daily drainage volume as it is not recorded on paper work at bedside.  Is having regular BM's, recently 3 or more incontinent stools daily. Meds include reglan 10 tid, pepcid 20 daily, senna 28m daily Miralax 17 gm daily, Simethicone 80 mg daily.  Also on enteric iron, midodrine.  [facility]    Review of older imaging studies (xrays, CT) as far back as June show diffuse dilation of colon, no volvulus.  Stool in the rectum which measured  5.5 cm diameter, proximal colonic distention up to 9 cm, all consistent with adynamic ileus. On serial xrays the distention waxes/wanes. Latest 2 xrays show improving, non-obstructive BGP but diffuse air-filled loops of large and small bowel.    Potassium, magnesium, BUN/Creat, Na normal.   T bili nml.  Alk phos 130.  AST/ALT 79/174.  Lipase 25.  Last  previous LFTs normal on 02/22/22 Hgb 9.5, in line w prior levels (8-10) in recent months).    [place]  Spouse KKGURKY HCWCBJ [phone]  3 children.  Attended seminary school.  Previously working as an UMining engineer  High school graduate. Family history of diabetes in both parents and multiple siblings.  Hypertension in a sibling.  Past Medical History:  Diagnosis Date   [place]    COVID-19 virus infection 06/2020   Meningitis 2003   spinal   [place]    Prediabetes 11/05/2016   A1C 5.7 on 11/05/16    Past Surgical History:  Procedure Laterality Date   MASS EXCISION Right 11/26/2014   Procedure: EXCISION MASS/GROIN EXCISION MASS;  Surgeon: JRobert Bellow MD;  Location: [facility] ORS;  Service: General;  Laterality: Right;   MASS EXCISION Right 11/26/2014   Procedure: MINOR EXCISION OF MASS/POST THIGH MASS;  Surgeon: JRobert Bellow MD;  Location: [facility] ORS;  Service: General;  Laterality: Right;   PEG PLACEMENT  10/27/2021   PORTACATH PLACEMENT Right 06/19/2019   Note that this list is not accurate, it is  from dc list of 01/2022 Prior to Admission medications   Medication Sig Start Date End Date Taking? Authorizing Provider  Baclofen 5 MG TABS Place 5 mg into feeding tube 3 (three) times daily as needed for muscle spasms. 01/08/22   Dessa Phi, DO  bisacodyl (DULCOLAX) 10 MG suppository Place 10 mg rectally daily as needed for severe constipation. 09/18/21   [provider]  enoxaparin (LOVENOX) 40 MG/0.4ML injection Inject 0.4 mLs (40 mg total) into the skin daily. DVT prophylaxis 01/08/22   Dessa Phi, DO  hydrOXYzine (ATARAX) 25 MG tablet Place 1 tablet (25 mg total) into feeding tube 2 (two) times daily as needed for anxiety. 01/08/22   Dessa Phi, DO  hydrOXYzine (ATARAX) 50 MG tablet Place 1 tablet (50 mg total) into feeding tube at bedtime. 01/08/22   Dessa Phi, DO  melatonin 5 MG TABS  Place 1 tablet (5 mg total) into feeding tube at bedtime. 01/08/22   Dessa Phi, DO  Nutritional Supplements (FEEDING SUPPLEMENT, KATE FARMS STANDARD 1.4,) LIQD liquid Place 65 mL/hr into feeding tube every 8 (eight) hours. 01/08/22   Dessa Phi, DO  pantoprazole sodium (PROTONIX) 40 mg Place 40 mg into feeding tube daily. 01/08/22   Dessa Phi, DO  polyethylene glycol powder (GLYCOLAX/MIRALAX) 17 GM/SCOOP powder Place 17 g into feeding tube daily as needed for constipation. 07/30/21   [provider]  scopolamine (TRANSDERM-SCOP) 1 MG/3DAYS Place 1 patch (1.5 mg total) onto the skin every 3 (three) days. 01/11/22   Dessa Phi, DO  Sennosides (SENNA) 8.8 MG/5ML LIQD Take 10 mLs by mouth daily as needed for constipation. 07/30/21   [provider]  tobramycin, PF, (TOBI) 300 MG/5ML nebulizer solution Take 5 mLs (300 mg total) by nebulization 2 (two) times daily. 01/08/22   Dessa Phi, DO    Scheduled Meds:  Infusions:  PRN Meds:    Allergies as of 05/19/2022 - Review Complete 05/19/2022  Allergen Reaction Noted   Crab [shellfish allergy] Rash 11/11/2014    Family History  Problem Relation Age of Onset   Diabetes Father    Diabetes Mother    Diabetes Sister    Hypertension Sister    Diabetes Brother    Diabetes Sister    Fibromyalgia Sister    Diabetes Sister     Social History   Socioeconomic History   Marital status: Married    Spouse name: [name]   Number of children: 3   Years of education: Not on file   Highest education level: High school graduate  Occupational History   Occupation: Mining engineer    Comment: self employed   Occupation: Ship broker    Comment: [employer]  Tobacco Use   Smoking status: Never   Smokeless tobacco: Never  Scientific laboratory technician Use: Never used  Substance and Sexual Activity   Alcohol use: No    Alcohol/week: 0.0 standard drinks of alcohol   Drug use: No   Sexual activity: Yes    Partners: Female  Other  Topics Concern   Not on file  Social History Narrative   Lives with wife and 3 children   Approved for disability 02/2019 for ALS    Social Determinants of Health   Financial Resource Strain: Low Risk  (09/04/2020)   Overall Financial Resource Strain (CARDIA)    Difficulty of Paying Living Expenses: Not very hard  Food Insecurity: No Food Insecurity (01/21/2022)   Hunger Vital Sign    Worried About Running Out of Food  in the Last Year: Never true    [place] in the Last Year: Never true  Transportation Needs: No Transportation Needs (01/21/2022)   PRAPARE - Hydrologist (Medical): No    Lack of Transportation (Non-Medical): No  Physical Activity: Inactive (09/04/2020)   Exercise Vital Sign    Days of Exercise per Week: 0 days    Minutes of Exercise per Session: 0 min  Stress: No Stress Concern Present (09/04/2020)   Ball    Feeling of Stress : Not at all  Social Connections: Moderately Isolated (09/04/2020)   Social Connection and Isolation Panel [NHANES]    Frequency of Communication with Friends and Family: More than three times a week    Frequency of Social Gatherings with Friends and Family: More than three times a week    Attends Religious Services: Never    Marine scientist or Organizations: No    Attends Archivist Meetings: Never    Marital Status: Married  Human resources officer Violence: Not At Risk (09/04/2020)   Humiliation, Afraid, Rape, and Kick questionnaire    Fear of Current or Ex-Partner: No    Emotionally Abused: No    Physically Abused: No    Sexually Abused: No    REVIEW OF SYSTEMS: Constitutional: Bedridden ENT:  No nose bleeds Pulm: No respiratory distress on trach/vent CV:  No palpitations, no LE edema.  No angina GU:  No hematuria, no frequency.  No longer has suprapubic cath, has condom cath in place GI: Per HPI. Heme: No unusual or  excessive bleeding/bruising. Transfusions: None Neuro:  No headaches, no peripheral tingling or numbness Derm:  No itching, no rash or sores.  Endocrine:  No sweats or chills.  No polyuria or dysuria Immunization: Reviewed. Travel:  None    PHYSICAL EXAM: Vital signs in last 24 hours: Vitals:   05/19/22 0812 05/19/22 0815  BP:  103/63  Pulse:  81  Resp:  16  Temp: 97.9 F (36.6 C)   SpO2:  99%   Wt Readings from Last 3 Encounters:  01/06/22 55.1 kg  11/18/21 29.6 kg  10/03/20 82.1 kg    General: Patient is alert, resting comfortably on the bed.  Communicates via a computer and with eye blinking Head: No facial asymmetry or swelling.  No signs of head trauma. Eyes: No icterus.  Conjunctiva pink. Ears: No hearing loss Nose: No discharge Mouth: Mucosa is moist, pink, clear.  Tongue midline.  Unable to fully open his jaw/mouth.  Observed teeth are crooked but in good health. Neck: Trach in place with no obvious discharge Lungs: Some rhonchi bilaterally.  No labored breathing on the vent.  Subcutaneous device palpable in upper right sternum Heart: RRR.  Rate in 90s Abdomen: Soft without tenderness.  Bowel sounds present, slightly hypoactive.  No high-pitched or tinkling bowel sounds.  G-tube positioned in right mid/upper abdomen.  Bag attached to the G-tube contains about 175 MLS of slightly brown, clear, nonbloody drainage that looks like gastric secretions. Rectal: Deferred DRE.   Musc/Skeltl: Muscular atrophy in the legs. Extremities: No CCE Neurologic: Down pointing feet/toes bilaterally.  Communicates with eye blinking.  1 blink is no, 2 blinks is yes.  Also communicates you with the use of a computer tablet though he does not speak.  No confusion.  Engaged and no signs of disorientation. Skin: No rash, no obvious sores but did not turn over patient  for exam.   Psych: Calm, cooperative.  Intake/Output from previous day: No intake/output data recorded. Intake/Output this  shift: No intake/output data recorded.  LAB RESULTS: Recent Labs    05/19/22 0748  WBC 7.0  HGB 9.5*  HCT 31.4*  PLT 199   BMET Lab Results  Component Value Date   NA 138 05/16/2022   NA 137 05/10/2022   NA 137 05/08/2022   K 3.7 05/16/2022   K 3.7 05/10/2022   K 3.7 05/08/2022   CL 110 05/16/2022   CL 109 05/10/2022   CL 108 05/08/2022   CO2 21 (L) 05/16/2022   CO2 21 (L) 05/10/2022   CO2 23 05/08/2022   GLUCOSE 131 (H) 05/16/2022   GLUCOSE 103 (H) 05/10/2022   GLUCOSE 134 (H) 05/08/2022   BUN 16 05/16/2022   BUN 18 05/10/2022   BUN 19 05/08/2022   CREATININE <0.30 (L) 05/16/2022   CREATININE <0.30 (L) 05/10/2022   CREATININE <0.30 (L) 05/08/2022   CALCIUM 9.4 05/16/2022   CALCIUM 9.4 05/10/2022   CALCIUM 9.4 05/08/2022   LFT No results for input(s): "PROT", "ALBUMIN", "AST", "ALT", "ALKPHOS", "BILITOT", "BILIDIR", "IBILI" in the last 72 hours. PT/INR Lab Results  Component Value Date   INR 1.4 (H) 12/10/2021   Hepatitis Panel No results for input(s): "HEPBSAG", "HCVAB", "HEPAIGM", "HEPBIGM" in the last 72 hours. C-Diff No components found for: "CDIFF" Lipase     Component Value Date/Time   LIPASE 31 12/09/2021 0400    Drugs of Abuse  No results found for: "LABOPIA", "COCAINSCRNUR", "LABBENZ", "AMPHETMU", "THCU", "LABBARB"   RADIOLOGY STUDIES: DG Abd 1 View  Result Date: 05/17/2022 CLINICAL DATA:  Ileus EXAM: ABDOMEN - 1 VIEW COMPARISON:  Portable exam 0914 hours compared to 05/14/2022 FINDINGS: Air-filled loops of large and small bowel throughout abdomen. Decreased colonic distention versus previous exam. G-tube noted. No bowel wall thickening or obstruction. Osseous structures unremarkable. IMPRESSION: Nonobstructive bowel gas pattern with decreased colonic distension since previous study. Electronically Signed   By: Lavonia Dana M.D.   On: 05/17/2022 09:28    ENDOSCOPIC STUDIES: None per Epic review.    IMPRESSION:     Chronic SB/Colonic ileus  for several months in patient with advanced ALS.  TPN dependent beginning around August. Having regular BM's. Recent plain films encouraging for improving ileus pattern.  Attempts to reinitiate tube feeds 3 to 4 weeks ago apparently unsuccessful.  Not clear what volume of gastric residuals are draining through the venting G-tube.  Electrolytes not deranged.    Elevated alk phos, transaminases.  Likely secondary to TNA.  LFTs have not been checked for close to 3 months, in late August they were normal.  CT abdomen pelvis 9/24 with normal liver, GB, pancreas, spleen, bile ducts    Advanced ALS.  Lurline Idol 12/2021.  PEG 10/2021.    Normocytic anemia.  Receives daily liquid iron supplement via G-tube  PLAN:     Dr. Hilarie Fredrickson will see the patient but after discussing the case he feels patient may be a candidate for [facility].  Dose of this is 2 mg daily.      Stop enteral iron as this is constipating   Azucena Freed  05/19/2022, 8:36 AM Phone [phone]

## 2022-05-19 NOTE — Progress Notes (Signed)
error 

## 2022-05-19 NOTE — Discharge Instructions (Signed)
Patient was seen by gastroenterology and consult to follow in epic.

## 2022-05-19 NOTE — Progress Notes (Signed)
RT placed pt on servo I. Pt on VT 500 R16 30% +5 per select RT.

## 2022-05-21 ENCOUNTER — Telehealth: Payer: Self-pay

## 2022-05-21 NOTE — Progress Notes (Addendum)
Pulmonary Critical Care Medicine [place]   PULMONARY CRITICAL CARE SERVICE  PROGRESS NOTE     Jerry [name]  [id number]  DOB: 08-09-1970   DOA: 05/19/2022  Referring Physician: Satira Sark, MD  HPI: Jerry [name] is a 51 y.o. male being followed for ventilator/airway/oxygen weaning Acute on Chronic Respiratory Failure.  Patient seen lying in bed, recently returned from ED.  Patient remains on baseline full support ventilation.  Medications: Reviewed on Rounds  Physical Exam:  Vitals: Temp 97.1, pulse 75, respirations 19, BP 105/65, SPO2 100%  Ventilator Settings AC VC, FiO2 28%, tidal volume 500, rate 16, PEEP 5  General: Comfortable at this time Neck: supple Cardiovascular: no malignant arrhythmias Respiratory: Bilaterally diminished Skin: no rash seen on limited exam Musculoskeletal: No gross abnormality Psychiatric:unable to assess Neurologic:no involuntary movements         Lab Data:   Basic Metabolic Panel: Recent Labs  Lab 05/16/22 0052 05/19/22 0211 05/19/22 0748  NA 138  --  138  K 3.7  --  3.8  CL 110  --  109  CO2 21*  --  23  GLUCOSE 131*  --  141*  BUN 16  --  20  CREATININE <0.30*  --  <0.30*  CALCIUM 9.4  --  9.4  MG 2.0 1.9  --   PHOS 3.4 3.2  --     ABG: No results for input(s): "PHART", "PCO2ART", "PO2ART", "HCO3", "O2SAT" in the last 168 hours.  Liver Function Tests: Recent Labs  Lab 05/19/22 0748  AST 79*  ALT 174*  ALKPHOS 130*  BILITOT 0.4  PROT 7.8  ALBUMIN 3.2*   Recent Labs  Lab 05/19/22 0748  LIPASE 25   No results for input(s): "AMMONIA" in the last 168 hours.  CBC: Recent Labs  Lab 05/16/22 0052 05/19/22 0748  WBC 6.5 7.0  NEUTROABS  --  5.0  HGB 9.5* 9.5*  HCT 29.8* 31.4*  MCV 84.9 88.0  PLT 194 199    Cardiac Enzymes: No results for input(s): "CKTOTAL", "CKMB", "CKMBINDEX", "TROPONINI" in the last 168 hours.  BNP (last 3 results) No results for input(s): "BNP" in the  last 8760 hours.  ProBNP (last 3 results) No results for input(s): "PROBNP" in the last 8760 hours.  Radiological Exams: No results found.  Assessment/Plan Active Problems:   ALS (amyotrophic lateral sclerosis) (HCC)   Cardiac arrest (HCC)   Pseudomonas pneumonia (HCC)   Autonomic dysfunction   Acute on chronic respiratory failure with hypoxia (HCC)  Acute on chronic respiratory failure hypoxia- we will continue with full support on assist control mode patient has been stable on the current settings and is not able to wean ALS -severe advanced disease no change Autonomic dysfunction -likely contributing to the ileus Pseudomonas pneumonia -has been treated we will continue to monitor Sepsis with shock- treated resolved hemodynamics are stable   I have personally seen and evaluated the patient, evaluated laboratory and imaging results, formulated the assessment and plan and placed orders. The Patient requires high complexity decision making with multiple systems involvement.  Rounds were done with the Respiratory Therapy Director and Staff therapists and discussed with nursing staff also.  Allyne Gee, MD [facility] Pulmonary Critical Care Medicine Sleep Medicine

## 2022-05-21 NOTE — Telephone Encounter (Signed)
  Care Management   Visit Note  05/21/2022 Name: Kellen Dutch MRN: [id number] DOB: [birth year]  Subjective: Armonte Tortorella is a 51 y.o. year old male who is a primary care patient of [name], MD. The Care Management team was consulted for assistance.

## 2022-05-21 NOTE — Progress Notes (Addendum)
Pulmonary Critical Care Medicine [place]   PULMONARY CRITICAL CARE SERVICE  PROGRESS NOTE     Jerry [name]  [id number]  DOB: 1971/04/01   DOA: 05/19/2022  Referring Physician: Satira Sark, MD  HPI: Jerry [name] is a 51 y.o. male being followed for ventilator/airway/oxygen weaning Acute on Chronic Respiratory Failure.  Patient seen lying in bed, currently remains on baseline of full support ventilation.  Medications: Reviewed on Rounds  Physical Exam:  Vitals: Temp 96.9, pulse 64, respirations 17, BP 112/64, SPO2 99%  Ventilator Settings AC VC, FiO2 20%, tidal volume 500, rate 16, PEEP 5  General: Comfortable at this time Neck: supple Cardiovascular: no malignant arrhythmias Respiratory: Bilaterally diminished Skin: no rash seen on limited exam Musculoskeletal: No gross abnormality Psychiatric:unable to assess Neurologic:no involuntary movements         Lab Data:   Basic Metabolic Panel: Recent Labs  Lab 05/16/22 0052 05/19/22 0211 05/19/22 0748  NA 138  --  138  K 3.7  --  3.8  CL 110  --  109  CO2 21*  --  23  GLUCOSE 131*  --  141*  BUN 16  --  20  CREATININE <0.30*  --  <0.30*  CALCIUM 9.4  --  9.4  MG 2.0 1.9  --   PHOS 3.4 3.2  --     ABG: No results for input(s): "PHART", "PCO2ART", "PO2ART", "HCO3", "O2SAT" in the last 168 hours.  Liver Function Tests: Recent Labs  Lab 05/19/22 0748  AST 79*  ALT 174*  ALKPHOS 130*  BILITOT 0.4  PROT 7.8  ALBUMIN 3.2*   Recent Labs  Lab 05/19/22 0748  LIPASE 25   No results for input(s): "AMMONIA" in the last 168 hours.  CBC: Recent Labs  Lab 05/16/22 0052 05/19/22 0748  WBC 6.5 7.0  NEUTROABS  --  5.0  HGB 9.5* 9.5*  HCT 29.8* 31.4*  MCV 84.9 88.0  PLT 194 199    Cardiac Enzymes: No results for input(s): "CKTOTAL", "CKMB", "CKMBINDEX", "TROPONINI" in the last 168 hours.  BNP (last 3 results) No results for input(s): "BNP" in the last 8760  hours.  ProBNP (last 3 results) No results for input(s): "PROBNP" in the last 8760 hours.  Radiological Exams: No results found.  Assessment/Plan Active Problems:   ALS (amyotrophic lateral sclerosis) (HCC)   Cardiac arrest (HCC)   Pseudomonas pneumonia (HCC)   Autonomic dysfunction   Acute on chronic respiratory failure with hypoxia (HCC)  Acute on chronic respiratory failure hypoxia- we will continue with full support on assist control mode patient has been stable on the current settings and is not able to wean ALS -severe advanced disease no change Autonomic dysfunction -likely contributing to the ileus Pseudomonas pneumonia -has been treated we will continue to monitor Sepsis with shock- treated resolved hemodynamics are stable   I have personally seen and evaluated the patient, evaluated laboratory and imaging results, formulated the assessment and plan and placed orders. The Patient requires high complexity decision making with multiple systems involvement.  Rounds were done with the Respiratory Therapy Director and Staff therapists and discussed with nursing staff also.  Allyne Gee, MD [facility] Pulmonary Critical Care Medicine Sleep Medicine

## 2022-05-22 LAB — COMPREHENSIVE METABOLIC PANEL
ALT: 173 U/L — ABNORMAL HIGH (ref 0–44)
AST: 66 U/L — ABNORMAL HIGH (ref 15–41)
Albumin: 3.2 g/dL — ABNORMAL LOW (ref 3.5–5.0)
Alkaline Phosphatase: 129 U/L — ABNORMAL HIGH (ref 38–126)
Anion gap: 9 (ref 5–15)
BUN: 20 mg/dL (ref 6–20)
CO2: 22 mmol/L (ref 22–32)
Calcium: 9.7 mg/dL (ref 8.9–10.3)
Chloride: 109 mmol/L (ref 98–111)
Creatinine, Ser: <0.3 mg/dL — ABNORMAL LOW (ref 0.61–1.24)
Glucose, Bld: 124 mg/dL — ABNORMAL HIGH (ref 70–99)
Potassium: 3.8 mmol/L (ref 3.5–5.1)
Sodium: 140 mmol/L (ref 135–145)
Total Bilirubin: 0.4 mg/dL (ref 0.3–1.2)
Total Protein: 7.6 g/dL (ref 6.5–8.1)

## 2022-05-22 LAB — CBC
HCT: 31.9 % — ABNORMAL LOW (ref 39.0–52.0)
Hemoglobin: 10 g/dL — ABNORMAL LOW (ref 13.0–17.0)
MCH: 27.2 pg (ref 26.0–34.0)
MCHC: 31.3 g/dL (ref 30.0–36.0)
MCV: 86.7 fL (ref 80.0–100.0)
Platelets: 179 10*3/uL (ref 150–400)
RBC: 3.68 MIL/uL — ABNORMAL LOW (ref 4.22–5.81)
RDW: 17.7 % — ABNORMAL HIGH (ref 11.5–15.5)
WBC: 6.5 10*3/uL (ref 4.0–10.5)
nRBC: 0 % (ref 0.0–0.2)

## 2022-05-22 LAB — MAGNESIUM: Magnesium: 1.9 mg/dL (ref 1.7–2.4)

## 2022-05-22 LAB — PHOSPHORUS: Phosphorus: 3.3 mg/dL (ref 2.5–4.6)

## 2022-05-24 ENCOUNTER — Telehealth: Payer: Self-pay

## 2022-05-24 ENCOUNTER — Other Ambulatory Visit (HOSPITAL_COMMUNITY): Payer: Self-pay

## 2022-05-24 DIAGNOSIS — K6389 Other specified diseases of intestine: Secondary | ICD-10-CM | POA: Diagnosis not present

## 2022-05-24 NOTE — Progress Notes (Addendum)
Pulmonary Critical Care Medicine [place]   PULMONARY CRITICAL CARE SERVICE  PROGRESS NOTE     Jerry [name]  [id number]  DOB: 02/08/1971   DOA: 05/19/2022  Referring Physician: Satira Sark, MD  HPI: Jerry [name] is a 51 y.o. male being followed for ventilator/airway/oxygen weaning Acute on Chronic Respiratory Failure.  Patient seen lying in bed, currently remains on full support ventilation.  According to respiratory therapy we will be restarting family training as his disposition has changed to go home.  Medications: Reviewed on Rounds  Physical Exam:  Vitals: Temp 98.0, pulse 87, respirations 30, BP 151/94, SPO2 100%  Ventilator Settings AC VC, FiO2 28%, tidal volume 500, rate 16, PEEP 5   General: Comfortable at this time Neck: supple Cardiovascular: no malignant arrhythmias Respiratory: Bilaterally diminished Skin: no rash seen on limited exam Musculoskeletal: No gross abnormality Psychiatric:unable to assess Neurologic:no involuntary movements         Lab Data:   Basic Metabolic Panel: Recent Labs  Lab 05/19/22 0211 05/19/22 0748 05/22/22 0123  NA  --  138 140  K  --  3.8 3.8  CL  --  109 109  CO2  --  23 22  GLUCOSE  --  141* 124*  BUN  --  20 20  CREATININE  --  <0.30* <0.30*  CALCIUM  --  9.4 9.7  MG 1.9  --  1.9  PHOS 3.2  --  3.3    ABG: No results for input(s): "PHART", "PCO2ART", "PO2ART", "HCO3", "O2SAT" in the last 168 hours.  Liver Function Tests: Recent Labs  Lab 05/19/22 0748 05/22/22 0123  AST 79* 66*  ALT 174* 173*  ALKPHOS 130* 129*  BILITOT 0.4 0.4  PROT 7.8 7.6  ALBUMIN 3.2* 3.2*   Recent Labs  Lab 05/19/22 0748  LIPASE 25   No results for input(s): "AMMONIA" in the last 168 hours.  CBC: Recent Labs  Lab 05/19/22 0748 05/22/22 0123  WBC 7.0 6.5  NEUTROABS 5.0  --   HGB 9.5* 10.0*  HCT 31.4* 31.9*  MCV 88.0 86.7  PLT 199 179    Cardiac Enzymes: No results for input(s):  "CKTOTAL", "CKMB", "CKMBINDEX", "TROPONINI" in the last 168 hours.  BNP (last 3 results) No results for input(s): "BNP" in the last 8760 hours.  ProBNP (last 3 results) No results for input(s): "PROBNP" in the last 8760 hours.  Radiological Exams: No results found.  Assessment/Plan Active Problems:   ALS (amyotrophic lateral sclerosis) (HCC)   Cardiac arrest (HCC)   Pseudomonas pneumonia (HCC)   Autonomic dysfunction   Acute on chronic respiratory failure with hypoxia (HCC)   Acute on chronic respiratory failure hypoxia- we will continue with full support on assist control mode patient has been stable on the current settings and is not able to wean ALS -severe advanced disease no change Autonomic dysfunction -likely contributing to the ileus Pseudomonas pneumonia -has been treated we will continue to monitor Sepsis with shock- treated resolved hemodynamics are stable   I have personally seen and evaluated the patient, evaluated laboratory and imaging results, formulated the assessment and plan and placed orders. The Patient requires high complexity decision making with multiple systems involvement.  Rounds were done with the Respiratory Therapy Director and Staff therapists and discussed with nursing staff also.  Allyne Gee, MD [facility] Pulmonary Critical Care Medicine Sleep Medicine

## 2022-05-24 NOTE — Telephone Encounter (Signed)
  Care Management   Visit Note  05/24/2022 Name: Jerry [name] MRN: [id number] DOB: 02/27/1971  Subjective: Jerry [name] is a 51 y.o. year old male who is a primary care patient of [name], MD. The Care Management team was consulted for assistance.

## 2022-05-24 NOTE — Progress Notes (Addendum)
Pulmonary Critical Care Medicine [place]   PULMONARY CRITICAL CARE SERVICE  PROGRESS NOTE     Jerry [name]  [id number]:[id number]  DOB: September 11, 1970   DOA: 05/19/2022  Referring Physician: Satira Sark, MD  HPI: Jerry [name] is a 51 y.o. male being followed for ventilator/airway/oxygen weaning Acute on Chronic Respiratory Failure.  Patient seen lying in bed, currently watching TV.  Remains on full support ventilation.  Medications: Reviewed on Rounds  Physical Exam:  Vitals: Temp 98.5, pulse 77, respirations 23, BP 113/71, SPO2 100%  Ventilator Settings AC VC, FiO2 28%, tidal volume 500, rate 16, PEEP 5   General: Comfortable at this time Neck: supple Cardiovascular: no malignant arrhythmias Respiratory: Bilaterally diminished Skin: no rash seen on limited exam Musculoskeletal: No gross abnormality Psychiatric:unable to assess Neurologic:no involuntary movements         Lab Data:   Basic Metabolic Panel: Recent Labs  Lab 05/19/22 0211 05/19/22 0748 05/22/22 0123  NA  --  138 140  K  --  3.8 3.8  CL  --  109 109  CO2  --  23 22  GLUCOSE  --  141* 124*  BUN  --  20 20  CREATININE  --  <0.30* <0.30*  CALCIUM  --  9.4 9.7  MG 1.9  --  1.9  PHOS 3.2  --  3.3    ABG: No results for input(s): "PHART", "PCO2ART", "PO2ART", "HCO3", "O2SAT" in the last 168 hours.  Liver Function Tests: Recent Labs  Lab 05/19/22 0748 05/22/22 0123  AST 79* 66*  ALT 174* 173*  ALKPHOS 130* 129*  BILITOT 0.4 0.4  PROT 7.8 7.6  ALBUMIN 3.2* 3.2*   Recent Labs  Lab 05/19/22 0748  LIPASE 25   No results for input(s): "AMMONIA" in the last 168 hours.  CBC: Recent Labs  Lab 05/19/22 0748 05/22/22 0123  WBC 7.0 6.5  NEUTROABS 5.0  --   HGB 9.5* 10.0*  HCT 31.4* 31.9*  MCV 88.0 86.7  PLT 199 179    Cardiac Enzymes: No results for input(s): "CKTOTAL", "CKMB", "CKMBINDEX", "TROPONINI" in the last 168 hours.  BNP (last 3 results) No results for  input(s): "BNP" in the last 8760 hours.  ProBNP (last 3 results) No results for input(s): "PROBNP" in the last 8760 hours.  Radiological Exams: No results found.  Assessment/Plan Active Problems:   ALS (amyotrophic lateral sclerosis) (HCC)   Cardiac arrest (HCC)   Pseudomonas pneumonia (HCC)   Autonomic dysfunction   Acute on chronic respiratory failure with hypoxia (HCC)  Acute on chronic respiratory failure hypoxia- we will continue with full support on assist control mode patient has been stable on the current settings and is not able to wean ALS -severe advanced disease no change Autonomic dysfunction -likely contributing to the ileus Pseudomonas pneumonia -has been treated we will continue to monitor Sepsis with shock- treated resolved hemodynamics are stable   I have personally seen and evaluated the patient, evaluated laboratory and imaging results, formulated the assessment and plan and placed orders. The Patient requires high complexity decision making with multiple systems involvement.  Rounds were done with the Respiratory Therapy Director and Staff therapists and discussed with nursing staff also.  Allyne Gee, MD [facility] Pulmonary Critical Care Medicine Sleep Medicine

## 2022-05-24 NOTE — Progress Notes (Addendum)
Pulmonary Critical Care Medicine [place]   PULMONARY CRITICAL CARE SERVICE  PROGRESS NOTE     Jerry [name]  EXN:[id number]  DOB: [birth year]   DOA: 05/19/2022  Referring Physician: Satira Sark, MD  HPI: Jerry [name] is a 50 y.o. male being followed for ventilator/airway/oxygen weaning Acute on Chronic Respiratory Failure.  Patient seen lying in bed, currently remains on full support ventilation.  No overnight events currently noted.  Medications: Reviewed on Rounds  Physical Exam:  Vitals: Temp 98.6, pulse 57, respirations 19, BP 111/62, SPO2 99%  Ventilator Settings AC VC, FiO2 28%, tidal volume 500, rate 16, PEEP 5  General: Comfortable at this time Neck: supple Cardiovascular: no malignant arrhythmias Respiratory: Bilateral diminished Skin: no rash seen on limited exam Musculoskeletal: No gross abnormality Psychiatric:unable to assess Neurologic:no involuntary movements         Lab Data:   Basic Metabolic Panel: Recent Labs  Lab 05/19/22 0211 05/19/22 0748 05/22/22 0123  NA  --  138 140  K  --  3.8 3.8  CL  --  109 109  CO2  --  23 22  GLUCOSE  --  141* 124*  BUN  --  20 20  CREATININE  --  <0.30* <0.30*  CALCIUM  --  9.4 9.7  MG 1.9  --  1.9  PHOS 3.2  --  3.3    ABG: No results for input(s): "PHART", "PCO2ART", "PO2ART", "HCO3", "O2SAT" in the last 168 hours.  Liver Function Tests: Recent Labs  Lab 05/19/22 0748 05/22/22 0123  AST 79* 66*  ALT 174* 173*  ALKPHOS 130* 129*  BILITOT 0.4 0.4  PROT 7.8 7.6  ALBUMIN 3.2* 3.2*   Recent Labs  Lab 05/19/22 0748  LIPASE 25   No results for input(s): "AMMONIA" in the last 168 hours.  CBC: Recent Labs  Lab 05/19/22 0748 05/22/22 0123  WBC 7.0 6.5  NEUTROABS 5.0  --   HGB 9.5* 10.0*  HCT 31.4* 31.9*  MCV 88.0 86.7  PLT 199 179    Cardiac Enzymes: No results for input(s): "CKTOTAL", "CKMB", "CKMBINDEX", "TROPONINI" in the last 168 hours.  BNP (last 3  results) No results for input(s): "BNP" in the last 8760 hours.  ProBNP (last 3 results) No results for input(s): "PROBNP" in the last 8760 hours.  Radiological Exams: No results found.  Assessment/Plan Active Problems:   ALS (amyotrophic lateral sclerosis) (HCC)   Cardiac arrest (HCC)   Pseudomonas pneumonia (HCC)   Autonomic dysfunction   Acute on chronic respiratory failure with hypoxia (HCC)   Acute on chronic respiratory failure hypoxia- we will continue with full support on assist control mode patient has been stable on the current settings and is not able to wean ALS -severe advanced disease no change Autonomic dysfunction -likely contributing to the ileus Pseudomonas pneumonia -has been treated we will continue to monitor Sepsis with shock- treated resolved hemodynamics are stable   I have personally seen and evaluated the patient, evaluated laboratory and imaging results, formulated the assessment and plan and placed orders. The Patient requires high complexity decision making with multiple systems involvement.  Rounds were done with the Respiratory Therapy Director and Staff therapists and discussed with nursing staff also.  Allyne Gee, MD [facility] Pulmonary Critical Care Medicine Sleep Medicine

## 2022-05-25 ENCOUNTER — Telehealth: Payer: Self-pay

## 2022-05-25 NOTE — Progress Notes (Addendum)
Pulmonary Critical Care Medicine [place]   PULMONARY CRITICAL CARE SERVICE  PROGRESS NOTE     Jerry [name]  [id number]:[id number]  DOB: 17-Apr-1971   DOA: 05/19/2022  Referring Physician: Satira Sark, MD  HPI: Jerry [name] is a 51 y.o. male being followed for ventilator/airway/oxygen weaning Acute on Chronic Respiratory Failure.  Patient seen lying in bed, currently remains on baseline full support ventilation.  According to respiratory therapy we are currently trying family on trach care for disposition home.  Medications: Reviewed on Rounds  Physical Exam:  Vitals: Temp 97.7, pulse 72, respirations 20, BP 130/71, SPO2 100%  Ventilator Settings AC VC, FiO2 20%, tidal volume 500, rate 16, PEEP 5  General: Comfortable at this time Neck: supple Cardiovascular: no malignant arrhythmias Respiratory: Bilaterally diminished Skin: no rash seen on limited exam Musculoskeletal: No gross abnormality Psychiatric:unable to assess Neurologic:no involuntary movements         Lab Data:   Basic Metabolic Panel: Recent Labs  Lab 05/19/22 0211 05/19/22 0748 05/22/22 0123  NA  --  138 140  K  --  3.8 3.8  CL  --  109 109  CO2  --  23 22  GLUCOSE  --  141* 124*  BUN  --  20 20  CREATININE  --  <0.30* <0.30*  CALCIUM  --  9.4 9.7  MG 1.9  --  1.9  PHOS 3.2  --  3.3    ABG: No results for input(s): "PHART", "PCO2ART", "PO2ART", "HCO3", "O2SAT" in the last 168 hours.  Liver Function Tests: Recent Labs  Lab 05/19/22 0748 05/22/22 0123  AST 79* 66*  ALT 174* 173*  ALKPHOS 130* 129*  BILITOT 0.4 0.4  PROT 7.8 7.6  ALBUMIN 3.2* 3.2*   Recent Labs  Lab 05/19/22 0748  LIPASE 25   No results for input(s): "AMMONIA" in the last 168 hours.  CBC: Recent Labs  Lab 05/19/22 0748 05/22/22 0123  WBC 7.0 6.5  NEUTROABS 5.0  --   HGB 9.5* 10.0*  HCT 31.4* 31.9*  MCV 88.0 86.7  PLT 199 179    Cardiac Enzymes: No results for input(s): "CKTOTAL",  "CKMB", "CKMBINDEX", "TROPONINI" in the last 168 hours.  BNP (last 3 results) No results for input(s): "BNP" in the last 8760 hours.  ProBNP (last 3 results) No results for input(s): "PROBNP" in the last 8760 hours.  Radiological Exams: DG Abd 1 View  Result Date: 05/24/2022 CLINICAL DATA:  Ileus EXAM: ABDOMEN - 1 VIEW COMPARISON:  Radiograph 05/17/2022 FINDINGS: Diffuse gaseous distention of bowel in the abdomen, not significantly changed from prior. Gastrostomy tube overlies left upper quadrant. IMPRESSION: Diffuse gaseous distension of bowel, not significantly changed from prior, compatible with ileus. Electronically Signed   By: Maurine Simmering M.D.   On: 05/24/2022 11:25    Assessment/Plan Active Problems:   ALS (amyotrophic lateral sclerosis) (HCC)   Cardiac arrest (HCC)   Pseudomonas pneumonia (HCC)   Autonomic dysfunction   Acute on chronic respiratory failure with hypoxia (HCC)  Acute on chronic respiratory failure hypoxia- we will continue with full support on assist control mode patient has been stable on the current settings and is not able to wean ALS -severe advanced disease no change Autonomic dysfunction -likely contributing to the ileus Pseudomonas pneumonia -has been treated we will continue to monitor Sepsis with shock- treated resolved hemodynamics are stable   I have personally seen and evaluated the patient, evaluated laboratory and imaging results, formulated the assessment and plan  and placed orders. The Patient requires high complexity decision making with multiple systems involvement.  Rounds were done with the Respiratory Therapy Director and Staff therapists and discussed with nursing staff also.  Allyne Gee, MD [facility] Pulmonary Critical Care Medicine Sleep Medicine

## 2022-05-26 LAB — BASIC METABOLIC PANEL
Anion gap: 8 (ref 5–15)
BUN: 23 mg/dL — ABNORMAL HIGH (ref 6–20)
CO2: 25 mmol/L (ref 22–32)
Calcium: 9.6 mg/dL (ref 8.9–10.3)
Chloride: 106 mmol/L (ref 98–111)
Creatinine, Ser: <0.3 mg/dL — ABNORMAL LOW (ref 0.61–1.24)
Glucose, Bld: 130 mg/dL — ABNORMAL HIGH (ref 70–99)
Potassium: 4.1 mmol/L (ref 3.5–5.1)
Sodium: 139 mmol/L (ref 135–145)

## 2022-05-26 LAB — CBC
HCT: 28.9 % — ABNORMAL LOW (ref 39.0–52.0)
Hemoglobin: 9.2 g/dL — ABNORMAL LOW (ref 13.0–17.0)
MCH: 27 pg (ref 26.0–34.0)
MCHC: 31.8 g/dL (ref 30.0–36.0)
MCV: 84.8 fL (ref 80.0–100.0)
Platelets: 180 K/uL (ref 150–400)
RBC: 3.41 MIL/uL — ABNORMAL LOW (ref 4.22–5.81)
RDW: 17.6 % — ABNORMAL HIGH (ref 11.5–15.5)
WBC: 5.3 K/uL (ref 4.0–10.5)
nRBC: 0 % (ref 0.0–0.2)

## 2022-05-26 LAB — MAGNESIUM: Magnesium: 2 mg/dL (ref 1.7–2.4)

## 2022-05-27 NOTE — Progress Notes (Addendum)
Pulmonary Critical Care Medicine [place]   PULMONARY CRITICAL CARE SERVICE  PROGRESS NOTE     Jerry [name]  [id number]  DOB: [birth year]   DOA: 05/19/2022  Referring Physician: Satira Sark, MD  HPI: Jerry [name] is a 51 y.o. male being followed for ventilator/airway/oxygen weaning Acute on Chronic Respiratory Failure.  Patient seen lying in bed, currently remains on baseline of full support ventilation.  Continue family teaching for disposition home.  Medications: Reviewed on Rounds  Physical Exam:  Vitals: Temp 97.5, pulse 81, respirations 20, BP 118/74, SpO2 100%  Ventilator Settings AC VC, FiO2 20%, tidal volume 500, rate 16, PEEP 5  General: Comfortable at this time Neck: supple Cardiovascular: no malignant arrhythmias Respiratory: Bilaterally diminished Skin: no rash seen on limited exam Musculoskeletal: No gross abnormality Psychiatric:unable to assess Neurologic:no involuntary movements         Lab Data:   Basic Metabolic Panel: Recent Labs  Lab 05/22/22 0123 05/26/22 0241  NA 140 139  K 3.8 4.1  CL 109 106  CO2 22 25  GLUCOSE 124* 130*  BUN 20 23*  CREATININE <0.30* <0.30*  CALCIUM 9.7 9.6  MG 1.9 2.0  PHOS 3.3  --     ABG: No results for input(s): "PHART", "PCO2ART", "PO2ART", "HCO3", "O2SAT" in the last 168 hours.  Liver Function Tests: Recent Labs  Lab 05/22/22 0123  AST 66*  ALT 173*  ALKPHOS 129*  BILITOT 0.4  PROT 7.6  ALBUMIN 3.2*   No results for input(s): "LIPASE", "AMYLASE" in the last 168 hours. No results for input(s): "AMMONIA" in the last 168 hours.  CBC: Recent Labs  Lab 05/22/22 0123 05/26/22 0241  WBC 6.5 5.3  HGB 10.0* 9.2*  HCT 31.9* 28.9*  MCV 86.7 84.8  PLT 179 180    Cardiac Enzymes: No results for input(s): "CKTOTAL", "CKMB", "CKMBINDEX", "TROPONINI" in the last 168 hours.  BNP (last 3 results) No results for input(s): "BNP" in the last 8760 hours.  ProBNP (last 3  results) No results for input(s): "PROBNP" in the last 8760 hours.  Radiological Exams: No results found.  Assessment/Plan Active Problems:   ALS (amyotrophic lateral sclerosis) (HCC)   Cardiac arrest (HCC)   Pseudomonas pneumonia (HCC)   Autonomic dysfunction   Acute on chronic respiratory failure with hypoxia (HCC)  Acute on chronic respiratory failure hypoxia- we will continue with full support on assist control mode patient has been stable on the current settings and is not able to wean ALS -severe advanced disease no change Autonomic dysfunction -likely contributing to the ileus Pseudomonas pneumonia -has been treated we will continue to monitor Sepsis with shock- treated resolved hemodynamics are stable  I have personally seen and evaluated the patient, evaluated laboratory and imaging results, formulated the assessment and plan and placed orders. The Patient requires high complexity decision making with multiple systems involvement.  Rounds were done with the Respiratory Therapy Director and Staff therapists and discussed with nursing staff also.  Allyne Gee, MD [facility] Pulmonary Critical Care Medicine Sleep Medicine

## 2022-05-27 NOTE — Progress Notes (Addendum)
Pulmonary Critical Care Medicine [place]   PULMONARY CRITICAL CARE SERVICE  PROGRESS NOTE     Jerry [name]  [id number]  DOB: 09-26-1970   DOA: 05/19/2022  Referring Physician: Satira Sark, MD  HPI: Jerry [name] is a 51 y.o. male being followed for ventilator/airway/oxygen weaning Acute on Chronic Respiratory Failure.  Patient seen lying in bed, currently remains on baseline full support ventilation.  Medications: Reviewed on Rounds  Physical Exam:  Vitals: Temp 97.5, pulse 55, respirations 19, BP 118/63, SpO2 100%  Ventilator Settings AC VC, FiO2 20%, tidal volume 500, rate 16, PEEP 5  General: Comfortable at this time Neck: supple Cardiovascular: no malignant arrhythmias Respiratory: Bilaterally diminished Skin: no rash seen on limited exam Musculoskeletal: No gross abnormality Psychiatric:unable to assess Neurologic:no involuntary movements         Lab Data:   Basic Metabolic Panel: Recent Labs  Lab 05/22/22 0123 05/26/22 0241  NA 140 139  K 3.8 4.1  CL 109 106  CO2 22 25  GLUCOSE 124* 130*  BUN 20 23*  CREATININE <0.30* <0.30*  CALCIUM 9.7 9.6  MG 1.9 2.0  PHOS 3.3  --     ABG: No results for input(s): "PHART", "PCO2ART", "PO2ART", "HCO3", "O2SAT" in the last 168 hours.  Liver Function Tests: Recent Labs  Lab 05/22/22 0123  AST 66*  ALT 173*  ALKPHOS 129*  BILITOT 0.4  PROT 7.6  ALBUMIN 3.2*   No results for input(s): "LIPASE", "AMYLASE" in the last 168 hours. No results for input(s): "AMMONIA" in the last 168 hours.  CBC: Recent Labs  Lab 05/22/22 0123 05/26/22 0241  WBC 6.5 5.3  HGB 10.0* 9.2*  HCT 31.9* 28.9*  MCV 86.7 84.8  PLT 179 180    Cardiac Enzymes: No results for input(s): "CKTOTAL", "CKMB", "CKMBINDEX", "TROPONINI" in the last 168 hours.  BNP (last 3 results) No results for input(s): "BNP" in the last 8760 hours.  ProBNP (last 3 results) No results for input(s): "PROBNP" in the  last 8760 hours.  Radiological Exams: No results found.  Assessment/Plan Active Problems:   ALS (amyotrophic lateral sclerosis) (HCC)   Cardiac arrest (HCC)   Pseudomonas pneumonia (HCC)   Autonomic dysfunction   Acute on chronic respiratory failure with hypoxia (HCC)   Acute on chronic respiratory failure hypoxia- we will continue with full support on assist control mode patient has been stable on the current settings and is not able to wean ALS -severe advanced disease no change Autonomic dysfunction -likely contributing to the ileus Pseudomonas pneumonia -has been treated we will continue to monitor Sepsis with shock- treated resolved hemodynamics are stable   I have personally seen and evaluated the patient, evaluated laboratory and imaging results, formulated the assessment and plan and placed orders. The Patient requires high complexity decision making with multiple systems involvement.  Rounds were done with the Respiratory Therapy Director and Staff therapists and discussed with nursing staff also.  Allyne Gee, MD [facility] Pulmonary Critical Care Medicine Sleep Medicine

## 2022-05-28 NOTE — Progress Notes (Signed)
Pulmonary Critical Care Medicine [place]   PULMONARY CRITICAL CARE SERVICE  PROGRESS NOTE     Jerry [name]  [id number]  DOB: 12-Aug-1970   DOA: 05/19/2022  Referring Physician: Satira Sark, MD  HPI: Jerry [name] is a 51 y.o. male being followed for ventilator/airway/oxygen weaning Acute on Chronic Respiratory Failure.  Patient remains on the ventilator full support is on assist control mode  Medications: Reviewed on Rounds  Physical Exam:  Vitals: Temperature is 97.3 pulse 70 respiratory 20 blood pressure 122/70 saturations 100%  Ventilator Settings on assist control  General: Comfortable at this time Neck: supple Cardiovascular: no malignant arrhythmias Respiratory: No rhonchi no rales are noted Skin: no rash seen on limited exam Musculoskeletal: No gross abnormality Psychiatric:unable to assess Neurologic:no involuntary movements         Lab Data:   Basic Metabolic Panel: Recent Labs  Lab 05/22/22 0123 05/26/22 0241  NA 140 139  K 3.8 4.1  CL 109 106  CO2 22 25  GLUCOSE 124* 130*  BUN 20 23*  CREATININE <0.30* <0.30*  CALCIUM 9.7 9.6  MG 1.9 2.0  PHOS 3.3  --     ABG: No results for input(s): "PHART", "PCO2ART", "PO2ART", "HCO3", "O2SAT" in the last 168 hours.  Liver Function Tests: Recent Labs  Lab 05/22/22 0123  AST 66*  ALT 173*  ALKPHOS 129*  BILITOT 0.4  PROT 7.6  ALBUMIN 3.2*   No results for input(s): "LIPASE", "AMYLASE" in the last 168 hours. No results for input(s): "AMMONIA" in the last 168 hours.  CBC: Recent Labs  Lab 05/22/22 0123 05/26/22 0241  WBC 6.5 5.3  HGB 10.0* 9.2*  HCT 31.9* 28.9*  MCV 86.7 84.8  PLT 179 180    Cardiac Enzymes: No results for input(s): "CKTOTAL", "CKMB", "CKMBINDEX", "TROPONINI" in the last 168 hours.  BNP (last 3 results) No results for input(s): "BNP" in the last 8760 hours.  ProBNP (last 3 results) No results for input(s): "PROBNP" in the last 8760  hours.  Radiological Exams: No results found.  Assessment/Plan Active Problems:   ALS (amyotrophic lateral sclerosis) (HCC)   Cardiac arrest (HCC)   Pseudomonas pneumonia (HCC)   Autonomic dysfunction   Acute on chronic respiratory failure with hypoxia (HCC)   Acute on chronic respiratory failure hypoxia patient is on assist control mode which will be continued try to titrate oxygen as tolerated once again waiting on family to get full home training Cardiac arrest rhythm is stable at this time we will continue to monitor Pseudomonas pneumonia treated resolved hemodynamics are improved Autonomic dysfunction supportive care ALS advanced severe disease   I have personally seen and evaluated the patient, evaluated laboratory and imaging results, formulated the assessment and plan and placed orders. The Patient requires high complexity decision making with multiple systems involvement.  Rounds were done with the Respiratory Therapy Director and Staff therapists and discussed with nursing staff also.  Allyne Gee, MD [facility] Pulmonary Critical Care Medicine Sleep Medicine

## 2022-05-29 LAB — CBC
HCT: 32.7 % — ABNORMAL LOW (ref 39.0–52.0)
Hemoglobin: 10 g/dL — ABNORMAL LOW (ref 13.0–17.0)
MCH: 26.3 pg (ref 26.0–34.0)
MCHC: 30.6 g/dL (ref 30.0–36.0)
MCV: 86.1 fL (ref 80.0–100.0)
Platelets: 154 10*3/uL (ref 150–400)
RBC: 3.8 MIL/uL — ABNORMAL LOW (ref 4.22–5.81)
RDW: 17.7 % — ABNORMAL HIGH (ref 11.5–15.5)
WBC: 7.5 10*3/uL (ref 4.0–10.5)
nRBC: 0 % (ref 0.0–0.2)

## 2022-05-29 LAB — BASIC METABOLIC PANEL
Anion gap: 10 (ref 5–15)
BUN: 21 mg/dL — ABNORMAL HIGH (ref 6–20)
CO2: 21 mmol/L — ABNORMAL LOW (ref 22–32)
Calcium: 9.7 mg/dL (ref 8.9–10.3)
Chloride: 110 mmol/L (ref 98–111)
Creatinine, Ser: <0.3 mg/dL — ABNORMAL LOW (ref 0.61–1.24)
Glucose, Bld: 124 mg/dL — ABNORMAL HIGH (ref 70–99)
Potassium: 3.6 mmol/L (ref 3.5–5.1)
Sodium: 141 mmol/L (ref 135–145)

## 2022-05-29 LAB — MAGNESIUM: Magnesium: 1.9 mg/dL (ref 1.7–2.4)

## 2022-05-29 NOTE — Progress Notes (Signed)
Pulmonary Critical Care Medicine [place]   PULMONARY CRITICAL CARE SERVICE  PROGRESS NOTE     Jerry [name]  [id number]  DOB: [birth year]   DOA: 05/19/2022  Referring Physician: Satira Sark, MD  HPI: Jerry [name] is a 51 y.o. male being followed for ventilator/airway/oxygen weaning Acute on Chronic Respiratory Failure.  Patient is on the ventilator full support at baseline  Medications: Reviewed on Rounds  Physical Exam:  Vitals: Temperature is 97.4 pulse of 62 respiratory 21 blood pressure 160/97 saturations 100%  Ventilator Settings assist-control FiO2 28% tidal volume 500 PEEP 5  General: Comfortable at this time Neck: supple Cardiovascular: no malignant arrhythmias Respiratory: No rhonchi no rales noted Skin: no rash seen on limited exam Musculoskeletal: No gross abnormality Psychiatric:unable to assess Neurologic:no involuntary movements         Lab Data:   Basic Metabolic Panel: Recent Labs  Lab 05/26/22 0241 05/29/22 0127  NA 139 141  K 4.1 3.6  CL 106 110  CO2 25 21*  GLUCOSE 130* 124*  BUN 23* 21*  CREATININE <0.30* <0.30*  CALCIUM 9.6 9.7  MG 2.0 1.9    ABG: No results for input(s): "PHART", "PCO2ART", "PO2ART", "HCO3", "O2SAT" in the last 168 hours.  Liver Function Tests: No results for input(s): "AST", "ALT", "ALKPHOS", "BILITOT", "PROT", "ALBUMIN" in the last 168 hours. No results for input(s): "LIPASE", "AMYLASE" in the last 168 hours. No results for input(s): "AMMONIA" in the last 168 hours.  CBC: Recent Labs  Lab 05/26/22 0241 05/29/22 0127  WBC 5.3 7.5  HGB 9.2* 10.0*  HCT 28.9* 32.7*  MCV 84.8 86.1  PLT 180 154    Cardiac Enzymes: No results for input(s): "CKTOTAL", "CKMB", "CKMBINDEX", "TROPONINI" in the last 168 hours.  BNP (last 3 results) No results for input(s): "BNP" in the last 8760 hours.  ProBNP (last 3 results) No results for input(s): "PROBNP" in the last 8760  hours.  Radiological Exams: No results found.  Assessment/Plan Active Problems:   ALS (amyotrophic lateral sclerosis) (HCC)   Cardiac arrest (HCC)   Pseudomonas pneumonia (HCC)   Autonomic dysfunction   Acute on chronic respiratory failure with hypoxia (HCC)   Acute on chronic respiratory failure hypoxia we will continue with full support on the ventilator assist control mode currently on 28% FiO2 Cardiac arrest rhythm is stable at this time Pseudomonas pneumonia has been treated we will continue to monitor Autonomic dysfunction no change ALS advanced severe disease end-stage   I have personally seen and evaluated the patient, evaluated laboratory and imaging results, formulated the assessment and plan and placed orders. The Patient requires high complexity decision making with multiple systems involvement.  Rounds were done with the Respiratory Therapy Director and Staff therapists and discussed with nursing staff also.  Allyne Gee, MD [facility] Pulmonary Critical Care Medicine Sleep Medicine

## 2022-05-30 NOTE — Progress Notes (Signed)
Pulmonary Critical Care Medicine [place]   PULMONARY CRITICAL CARE SERVICE  PROGRESS NOTE     Jerry [name]  [id number]:[id number]  DOB: 09-26-1970   DOA: 05/19/2022  Referring Physician: Satira Sark, MD  HPI: Jerry [name] is a 51 y.o. male being followed for ventilator/airway/oxygen weaning Acute on Chronic Respiratory Failure.  Patient is on assist control mode good saturations are noted  Medications: Reviewed on Rounds  Physical Exam:  Vitals: Temperature is 97.8 pulse 78 respiratory rate 20 blood pressure 130/67 saturation 97%  Ventilator Settings assist-control FiO2 28% tidal volume 500 PEEP of 5  General: Comfortable at this time Neck: supple Cardiovascular: no malignant arrhythmias Respiratory: Scattered rhonchi expansion is equal Skin: no rash seen on limited exam Musculoskeletal: No gross abnormality Psychiatric:unable to assess Neurologic:no involuntary movements         Lab Data:   Basic Metabolic Panel: Recent Labs  Lab 05/26/22 0241 05/29/22 0127  NA 139 141  K 4.1 3.6  CL 106 110  CO2 25 21*  GLUCOSE 130* 124*  BUN 23* 21*  CREATININE <0.30* <0.30*  CALCIUM 9.6 9.7  MG 2.0 1.9    ABG: No results for input(s): "PHART", "PCO2ART", "PO2ART", "HCO3", "O2SAT" in the last 168 hours.  Liver Function Tests: No results for input(s): "AST", "ALT", "ALKPHOS", "BILITOT", "PROT", "ALBUMIN" in the last 168 hours. No results for input(s): "LIPASE", "AMYLASE" in the last 168 hours. No results for input(s): "AMMONIA" in the last 168 hours.  CBC: Recent Labs  Lab 05/26/22 0241 05/29/22 0127  WBC 5.3 7.5  HGB 9.2* 10.0*  HCT 28.9* 32.7*  MCV 84.8 86.1  PLT 180 154    Cardiac Enzymes: No results for input(s): "CKTOTAL", "CKMB", "CKMBINDEX", "TROPONINI" in the last 168 hours.  BNP (last 3 results) No results for input(s): "BNP" in the last 8760 hours.  ProBNP (last 3 results) No results for input(s): "PROBNP" in the last  8760 hours.  Radiological Exams: No results found.  Assessment/Plan Active Problems:   ALS (amyotrophic lateral sclerosis) (HCC)   Cardiac arrest (HCC)   Pseudomonas pneumonia (HCC)   Autonomic dysfunction   Acute on chronic respiratory failure with hypoxia (HCC)   Acute on chronic respiratory failure with hypoxia patient is on the ventilator full support at baseline Cardiac arrest rhythm is stable Pseudomonas pneumonia treated with antibiotics Autonomic dysfunction no change at this time ALS supportive care we will continue to follow along   I have personally seen and evaluated the patient, evaluated laboratory and imaging results, formulated the assessment and plan and placed orders. The Patient requires high complexity decision making with multiple systems involvement.  Rounds were done with the Respiratory Therapy Director and Staff therapists and discussed with nursing staff also.  Allyne Gee, MD [facility] Pulmonary Critical Care Medicine Sleep Medicine

## 2022-05-31 ENCOUNTER — Other Ambulatory Visit (HOSPITAL_COMMUNITY): Payer: Self-pay

## 2022-05-31 DIAGNOSIS — Z8719 Personal history of other diseases of the digestive system: Secondary | ICD-10-CM | POA: Diagnosis not present

## 2022-05-31 DIAGNOSIS — K6389 Other specified diseases of intestine: Secondary | ICD-10-CM | POA: Diagnosis not present

## 2022-05-31 LAB — BASIC METABOLIC PANEL
Anion gap: 11 (ref 5–15)
BUN: 22 mg/dL — ABNORMAL HIGH (ref 6–20)
CO2: 21 mmol/L — ABNORMAL LOW (ref 22–32)
Calcium: 9.6 mg/dL (ref 8.9–10.3)
Chloride: 106 mmol/L (ref 98–111)
Creatinine, Ser: <0.3 mg/dL — ABNORMAL LOW (ref 0.61–1.24)
Glucose, Bld: 144 mg/dL — ABNORMAL HIGH (ref 70–99)
Potassium: 3.7 mmol/L (ref 3.5–5.1)
Sodium: 138 mmol/L (ref 135–145)

## 2022-05-31 LAB — CBC
HCT: 31.6 % — ABNORMAL LOW (ref 39.0–52.0)
Hemoglobin: 10 g/dL — ABNORMAL LOW (ref 13.0–17.0)
MCH: 26.7 pg (ref 26.0–34.0)
MCHC: 31.6 g/dL (ref 30.0–36.0)
MCV: 84.5 fL (ref 80.0–100.0)
Platelets: 150 10*3/uL (ref 150–400)
RBC: 3.74 MIL/uL — ABNORMAL LOW (ref 4.22–5.81)
RDW: 17.7 % — ABNORMAL HIGH (ref 11.5–15.5)
WBC: 6.5 10*3/uL (ref 4.0–10.5)
nRBC: 0 % (ref 0.0–0.2)

## 2022-05-31 LAB — MAGNESIUM: Magnesium: 1.9 mg/dL (ref 1.7–2.4)

## 2022-05-31 NOTE — Progress Notes (Signed)
Pulmonary Critical Care Medicine [place]   PULMONARY CRITICAL CARE SERVICE  PROGRESS NOTE     Motty Borin  SVX:[id number]  DOB: [birth year]   DOA: 05/19/2022  Referring Physician: Satira Sark, MD  HPI: Gid Schoffstall is a 51 y.o. male being followed for ventilator/airway/oxygen weaning Acute on Chronic Respiratory Failure.  Patient is at baseline on full support assist control mode on 28% FiO2  Medications: Reviewed on Rounds  Physical Exam:  Vitals: Temperature is 98.0 pulse 71 respiratory is 29 blood pressure 101/59 saturations 100%  Ventilator Settings currently is on assist control FiO2 28% tidal volume 500 PEEP of 5  General: Comfortable at this time Neck: supple Cardiovascular: no malignant arrhythmias Respiratory: No rhonchi very coarse breath sounds Skin: no rash seen on limited exam Musculoskeletal: No gross abnormality Psychiatric:unable to assess Neurologic:no involuntary movements         Lab Data:   Basic Metabolic Panel: Recent Labs  Lab 05/26/22 0241 05/29/22 0127 05/31/22 0153  NA 139 141 138  K 4.1 3.6 3.7  CL 106 110 106  CO2 25 21* 21*  GLUCOSE 130* 124* 144*  BUN 23* 21* 22*  CREATININE <0.30* <0.30* <0.30*  CALCIUM 9.6 9.7 9.6  MG 2.0 1.9 1.9    ABG: No results for input(s): "PHART", "PCO2ART", "PO2ART", "HCO3", "O2SAT" in the last 168 hours.  Liver Function Tests: No results for input(s): "AST", "ALT", "ALKPHOS", "BILITOT", "PROT", "ALBUMIN" in the last 168 hours. No results for input(s): "LIPASE", "AMYLASE" in the last 168 hours. No results for input(s): "AMMONIA" in the last 168 hours.  CBC: Recent Labs  Lab 05/26/22 0241 05/29/22 0127 05/31/22 0153  WBC 5.3 7.5 6.5  HGB 9.2* 10.0* 10.0*  HCT 28.9* 32.7* 31.6*  MCV 84.8 86.1 84.5  PLT 180 154 150    Cardiac Enzymes: No results for input(s): "CKTOTAL", "CKMB", "CKMBINDEX", "TROPONINI" in the last 168 hours.  BNP (last 3 results) No results  for input(s): "BNP" in the last 8760 hours.  ProBNP (last 3 results) No results for input(s): "PROBNP" in the last 8760 hours.  Radiological Exams: DG Abd Portable 1V  Result Date: 05/31/2022 CLINICAL DATA:  51 year old male with history of is ileus. EXAM: PORTABLE ABDOMEN - 1 VIEW COMPARISON:  Abdominal radiograph 05/24/2022. FINDINGS: Gas is noted throughout the colon. No distal rectal gas is noted. Numerous prominent gas-filled dilated loops of small bowel are also noted throughout the central abdomen, similar to the prior study. Percutaneous gastrostomy tube with retention balloon projecting over the left upper quadrant of the abdomen. No definite pneumoperitoneum. IMPRESSION: 1. Bowel-gas pattern is again suggestive of an ileus, as above. Electronically Signed   By: Vinnie Langton M.D.   On: 05/31/2022 06:07    Assessment/Plan Active Problems:   ALS (amyotrophic lateral sclerosis) (HCC)   Cardiac arrest (HCC)   Pseudomonas pneumonia (HCC)   Autonomic dysfunction   Acute on chronic respiratory failure with hypoxia (HCC)   Acute on chronic respiratory failure with hypoxia we will continue with full support patient is at baseline secondary to the ALS Ametropic lateral sclerosis end-stage disease patient is ventilator dependent Cardiac arrest rhythm has been stable continue to monitor on telemetry Autonomic dysfunction appears to be stable hemodynamics Pseudomonas pneumonia has been treated with antibiotics   I have personally seen and evaluated the patient, evaluated laboratory and imaging results, formulated the assessment and plan and placed orders. The Patient requires high complexity decision making with multiple systems involvement.  Rounds were  done with the Respiratory Therapy Director and Staff therapists and discussed with nursing staff also.  Allyne Gee, MD [facility] Pulmonary Critical Care Medicine Sleep Medicine

## 2022-06-01 NOTE — Progress Notes (Signed)
Pulmonary Critical Care Medicine [place]   PULMONARY CRITICAL CARE SERVICE  PROGRESS NOTE     Jerry [name]  [id number]  DOB: 1971/06/29   DOA: 05/19/2022  Referring Physician: Satira Sark, MD  HPI: Jerry [name] is a 51 y.o. male being followed for ventilator/airway/oxygen weaning Acute on Chronic Respiratory Failure. ***  Medications: Reviewed on Rounds  Physical Exam:  Vitals: ***  Ventilator Settings ***  General: Comfortable at this time Neck: supple Cardiovascular: no malignant arrhythmias Respiratory: *** Skin: no rash seen on limited exam Musculoskeletal: No gross abnormality Psychiatric:unable to assess Neurologic:no involuntary movements         Lab Data:   Basic Metabolic Panel: Recent Labs  Lab 05/26/22 0241 05/29/22 0127 05/31/22 0153  NA 139 141 138  K 4.1 3.6 3.7  CL 106 110 106  CO2 25 21* 21*  GLUCOSE 130* 124* 144*  BUN 23* 21* 22*  CREATININE <0.30* <0.30* <0.30*  CALCIUM 9.6 9.7 9.6  MG 2.0 1.9 1.9    ABG: No results for input(s): "PHART", "PCO2ART", "PO2ART", "HCO3", "O2SAT" in the last 168 hours.  Liver Function Tests: No results for input(s): "AST", "ALT", "ALKPHOS", "BILITOT", "PROT", "ALBUMIN" in the last 168 hours. No results for input(s): "LIPASE", "AMYLASE" in the last 168 hours. No results for input(s): "AMMONIA" in the last 168 hours.  CBC: Recent Labs  Lab 05/26/22 0241 05/29/22 0127 05/31/22 0153  WBC 5.3 7.5 6.5  HGB 9.2* 10.0* 10.0*  HCT 28.9* 32.7* 31.6*  MCV 84.8 86.1 84.5  PLT 180 154 150    Cardiac Enzymes: No results for input(s): "CKTOTAL", "CKMB", "CKMBINDEX", "TROPONINI" in the last 168 hours.  BNP (last 3 results) No results for input(s): "BNP" in the last 8760 hours.  ProBNP (last 3 results) No results for input(s): "PROBNP" in the last 8760 hours.  Radiological Exams: DG Abd Portable 1V  Result Date: 05/31/2022 CLINICAL DATA:  51 year old male with history  of is ileus. EXAM: PORTABLE ABDOMEN - 1 VIEW COMPARISON:  Abdominal radiograph 05/24/2022. FINDINGS: Gas is noted throughout the colon. No distal rectal gas is noted. Numerous prominent gas-filled dilated loops of small bowel are also noted throughout the central abdomen, similar to the prior study. Percutaneous gastrostomy tube with retention balloon projecting over the left upper quadrant of the abdomen. No definite pneumoperitoneum. IMPRESSION: 1. Bowel-gas pattern is again suggestive of an ileus, as above. Electronically Signed   By: Vinnie Langton M.D.   On: 05/31/2022 06:07    Assessment/Plan Active Problems:   ALS (amyotrophic lateral sclerosis) (HCC)   Cardiac arrest (HCC)   Pseudomonas pneumonia (HCC)   Autonomic dysfunction   Acute on chronic respiratory failure with hypoxia (HCC)   *** *** *** *** ***   I have personally seen and evaluated the patient, evaluated laboratory and imaging results, formulated the assessment and plan and placed orders. The Patient requires high complexity decision making with multiple systems involvement.  Rounds were done with the Respiratory Therapy Director and Staff therapists and discussed with nursing staff also.  Allyne Gee, MD [facility] Pulmonary Critical Care Medicine Sleep Medicine

## 2022-06-02 ENCOUNTER — Telehealth: Payer: Self-pay

## 2022-06-02 LAB — BASIC METABOLIC PANEL
Anion gap: 6 (ref 5–15)
BUN: 22 mg/dL — ABNORMAL HIGH (ref 6–20)
CO2: 22 mmol/L (ref 22–32)
Calcium: 9.2 mg/dL (ref 8.9–10.3)
Chloride: 105 mmol/L (ref 98–111)
Creatinine, Ser: <0.3 mg/dL — ABNORMAL LOW (ref 0.61–1.24)
Glucose, Bld: 133 mg/dL — ABNORMAL HIGH (ref 70–99)
Potassium: 3.5 mmol/L (ref 3.5–5.1)
Sodium: 133 mmol/L — ABNORMAL LOW (ref 135–145)

## 2022-06-02 LAB — CBC
HCT: 29.9 % — ABNORMAL LOW (ref 39.0–52.0)
Hemoglobin: 9.5 g/dL — ABNORMAL LOW (ref 13.0–17.0)
MCH: 27.1 pg (ref 26.0–34.0)
MCHC: 31.8 g/dL (ref 30.0–36.0)
MCV: 85.2 fL (ref 80.0–100.0)
Platelets: 148 10*3/uL — ABNORMAL LOW (ref 150–400)
RBC: 3.51 MIL/uL — ABNORMAL LOW (ref 4.22–5.81)
RDW: 17.8 % — ABNORMAL HIGH (ref 11.5–15.5)
WBC: 6.4 10*3/uL (ref 4.0–10.5)
nRBC: 0 % (ref 0.0–0.2)

## 2022-06-02 LAB — MAGNESIUM: Magnesium: 1.7 mg/dL (ref 1.7–2.4)

## 2022-06-02 NOTE — Progress Notes (Signed)
Pulmonary Critical Care Medicine [place]   PULMONARY CRITICAL CARE SERVICE  PROGRESS NOTE     Rodriques Badie  [id number]:[id number]  DOB: [birth year]   DOA: 05/19/2022  Referring Physician: Satira Sark, MD  HPI: Jerry [name] is a 51 y.o. male being followed for ventilator/airway/oxygen weaning Acute on Chronic Respiratory Failure.  Patient remains on the ventilator full support assist control mode currently on 28% FiO2  Medications: Reviewed on Rounds  Physical Exam:  Vitals: Temperature is 97.3 pulse 78 respiratory 20 blood pressure 144/95 saturations 100%  Ventilator Settings patient is on assist control mode FiO2 28% PEEP 5  General: Comfortable at this time Neck: supple Cardiovascular: no malignant arrhythmias Respiratory: No rhonchi very coarse breath sounds Skin: no rash seen on limited exam Musculoskeletal: No gross abnormality Psychiatric:unable to assess Neurologic:no involuntary movements         Lab Data:   Basic Metabolic Panel: Recent Labs  Lab 05/29/22 0127 05/31/22 0153 06/02/22 0241  NA 141 138 133*  K 3.6 3.7 3.5  CL 110 106 105  CO2 21* 21* 22  GLUCOSE 124* 144* 133*  BUN 21* 22* 22*  CREATININE <0.30* <0.30* <0.30*  CALCIUM 9.7 9.6 9.2  MG 1.9 1.9 1.7    ABG: No results for input(s): "PHART", "PCO2ART", "PO2ART", "HCO3", "O2SAT" in the last 168 hours.  Liver Function Tests: No results for input(s): "AST", "ALT", "ALKPHOS", "BILITOT", "PROT", "ALBUMIN" in the last 168 hours. No results for input(s): "LIPASE", "AMYLASE" in the last 168 hours. No results for input(s): "AMMONIA" in the last 168 hours.  CBC: Recent Labs  Lab 05/29/22 0127 05/31/22 0153 06/02/22 0241  WBC 7.5 6.5 6.4  HGB 10.0* 10.0* 9.5*  HCT 32.7* 31.6* 29.9*  MCV 86.1 84.5 85.2  PLT 154 150 148*    Cardiac Enzymes: No results for input(s): "CKTOTAL", "CKMB", "CKMBINDEX", "TROPONINI" in the last 168 hours.  BNP (last 3 results) No results  for input(s): "BNP" in the last 8760 hours.  ProBNP (last 3 results) No results for input(s): "PROBNP" in the last 8760 hours.  Radiological Exams: No results found.  Assessment/Plan Active Problems:   ALS (amyotrophic lateral sclerosis) (HCC)   Cardiac arrest (HCC)   Pseudomonas pneumonia (HCC)   Autonomic dysfunction   Acute on chronic respiratory failure with hypoxia (HCC)   Acute on chronic respiratory failure hypoxia patient is at baseline on assist control mode ALS advanced severe disease Pseudomonas pneumonia has been treated we will continue to monitor Autonomic dysfunction stable baseline Cardiac arrest rhythm has been stable we will continue to monitor   I have personally seen and evaluated the patient, evaluated laboratory and imaging results, formulated the assessment and plan and placed orders. The Patient requires high complexity decision making with multiple systems involvement.  Rounds were done with the Respiratory Therapy Director and Staff therapists and discussed with nursing staff also.  Allyne Gee, MD [facility] Pulmonary Critical Care Medicine Sleep Medicine

## 2022-06-02 NOTE — Telephone Encounter (Signed)
  Care Management   Visit Note  06/02/2022 Name: Jerry [name] MRN: [id number] DOB: 07/31/1970  Subjective: Jerry [name] is a 51 y.o. year old male who is a primary care patient of [name], MD. The Care Management team was consulted for assistance.

## 2022-06-02 NOTE — Telephone Encounter (Signed)
.  rn

## 2022-06-03 ENCOUNTER — Other Ambulatory Visit (HOSPITAL_COMMUNITY): Payer: Self-pay

## 2022-06-03 DIAGNOSIS — K6389 Other specified diseases of intestine: Secondary | ICD-10-CM | POA: Diagnosis not present

## 2022-06-03 DIAGNOSIS — Z8719 Personal history of other diseases of the digestive system: Secondary | ICD-10-CM | POA: Diagnosis not present

## 2022-06-04 NOTE — Progress Notes (Cosign Needed)
Pulmonary Critical Care Medicine [place]   PULMONARY CRITICAL CARE SERVICE  PROGRESS NOTE     Jerry [name]  [id number]  DOB: 10-03-1970   DOA: 05/19/2022  Referring Physician: Satira Sark, MD  HPI: Jerry [name] is a 51 y.o. male being followed for ventilator/airway/oxygen weaning Acute on Chronic Respiratory Failure.  Patient seen lying in bed, currently remains on full support ventilation which is his baseline.  Medications: Reviewed on Rounds  Physical Exam:  Vitals: Temp 90.4, pulse 78, respirations 18, BP 118/74, SpO2 100%  Ventilator Settings AC VC, FiO2 28%, tidal volume 500, rate 16, PEEP 5  General: Comfortable at this time Neck: supple Cardiovascular: no malignant arrhythmias Respiratory: Bilaterally diminished Skin: no rash seen on limited exam Musculoskeletal: No gross abnormality Psychiatric:unable to assess Neurologic:no involuntary movements         Lab Data:   Basic Metabolic Panel: Recent Labs  Lab 05/29/22 0127 05/31/22 0153 06/02/22 0241  NA 141 138 133*  K 3.6 3.7 3.5  CL 110 106 105  CO2 21* 21* 22  GLUCOSE 124* 144* 133*  BUN 21* 22* 22*  CREATININE <0.30* <0.30* <0.30*  CALCIUM 9.7 9.6 9.2  MG 1.9 1.9 1.7    ABG: No results for input(s): "PHART", "PCO2ART", "PO2ART", "HCO3", "O2SAT" in the last 168 hours.  Liver Function Tests: No results for input(s): "AST", "ALT", "ALKPHOS", "BILITOT", "PROT", "ALBUMIN" in the last 168 hours. No results for input(s): "LIPASE", "AMYLASE" in the last 168 hours. No results for input(s): "AMMONIA" in the last 168 hours.  CBC: Recent Labs  Lab 05/29/22 0127 05/31/22 0153 06/02/22 0241  WBC 7.5 6.5 6.4  HGB 10.0* 10.0* 9.5*  HCT 32.7* 31.6* 29.9*  MCV 86.1 84.5 85.2  PLT 154 150 148*    Cardiac Enzymes: No results for input(s): "CKTOTAL", "CKMB", "CKMBINDEX", "TROPONINI" in the last 168 hours.  BNP (last 3 results) No results for input(s): "BNP" in the  last 8760 hours.  ProBNP (last 3 results) No results for input(s): "PROBNP" in the last 8760 hours.  Radiological Exams: DG Abd Portable 1V  Result Date: 06/03/2022 CLINICAL DATA:  51 year old male with history of ileus. EXAM: PORTABLE ABDOMEN - 1 VIEW COMPARISON:  05/31/2022. FINDINGS: Gas is noted throughout multiple loops of small bowel and colon, including the distal rectum. Small bowel loops appear prominent but non dilated. No frank pneumoperitoneum. Percutaneous gastrostomy tube with retention balloon projecting over the epigastric region. IMPRESSION: 1. Bowel-gas pattern remains most suggestive of an ileus, as above. Electronically Signed   By: Vinnie Langton M.D.   On: 06/03/2022 06:11    Assessment/Plan Active Problems:   ALS (amyotrophic lateral sclerosis) (HCC)   Cardiac arrest (HCC)   Pseudomonas pneumonia (HCC)   Autonomic dysfunction   Acute on chronic respiratory failure with hypoxia (HCC)  Acute on chronic respiratory failure hypoxia patient is at baseline on assist control mode ALS advanced severe disease Pseudomonas pneumonia has been treated we will continue to monitor Autonomic dysfunction stable baseline Cardiac arrest rhythm has been stable we will continue to monitor   I have personally seen and evaluated the patient, evaluated laboratory and imaging results, formulated the assessment and plan and placed orders. The Patient requires high complexity decision making with multiple systems involvement.  Rounds were done with the Respiratory Therapy Director and Staff therapists and discussed with nursing staff also.  Allyne Gee, MD [facility] Pulmonary Critical Care Medicine Sleep Medicine

## 2022-06-04 NOTE — Progress Notes (Cosign Needed Addendum)
Pulmonary Critical Care Medicine [place]   PULMONARY CRITICAL CARE SERVICE  PROGRESS NOTE     Cutter Passey  LKG:[id number]  DOB: 1971-01-13   DOA: 05/19/2022  Referring Physician: Satira Sark, MD  HPI: Jerry [name] is a 51 y.o. male being followed for ventilator/airway/oxygen weaning Acute on Chronic Respiratory Failure.  Patient seen lying in bed, currently remains on baseline full support ventilation.  Will continue with family training.  Medications: Reviewed on Rounds  Physical Exam:  Vitals: Temp 97.5, pulse 58, respiration 26, BP 120/73, SpO2 100%  Ventilator Settings AC/VC, tidal volume 500, FiO2 28%, rate 16, PEEP 5  General: Comfortable at this time Neck: supple Cardiovascular: no malignant arrhythmias Respiratory: Bilaterally diminished Skin: no rash seen on limited exam Musculoskeletal: No gross abnormality Psychiatric:unable to assess Neurologic:no involuntary movements         Lab Data:   Basic Metabolic Panel: Recent Labs  Lab 05/29/22 0127 05/31/22 0153 06/02/22 0241  NA 141 138 133*  K 3.6 3.7 3.5  CL 110 106 105  CO2 21* 21* 22  GLUCOSE 124* 144* 133*  BUN 21* 22* 22*  CREATININE <0.30* <0.30* <0.30*  CALCIUM 9.7 9.6 9.2  MG 1.9 1.9 1.7    ABG: No results for input(s): "PHART", "PCO2ART", "PO2ART", "HCO3", "O2SAT" in the last 168 hours.  Liver Function Tests: No results for input(s): "AST", "ALT", "ALKPHOS", "BILITOT", "PROT", "ALBUMIN" in the last 168 hours. No results for input(s): "LIPASE", "AMYLASE" in the last 168 hours. No results for input(s): "AMMONIA" in the last 168 hours.  CBC: Recent Labs  Lab 05/29/22 0127 05/31/22 0153 06/02/22 0241  WBC 7.5 6.5 6.4  HGB 10.0* 10.0* 9.5*  HCT 32.7* 31.6* 29.9*  MCV 86.1 84.5 85.2  PLT 154 150 148*    Cardiac Enzymes: No results for input(s): "CKTOTAL", "CKMB", "CKMBINDEX", "TROPONINI" in the last 168 hours.  BNP (last 3 results) No results for  input(s): "BNP" in the last 8760 hours.  ProBNP (last 3 results) No results for input(s): "PROBNP" in the last 8760 hours.  Radiological Exams: DG Abd Portable 1V  Result Date: 06/03/2022 CLINICAL DATA:  51 year old male with history of ileus. EXAM: PORTABLE ABDOMEN - 1 VIEW COMPARISON:  05/31/2022. FINDINGS: Gas is noted throughout multiple loops of small bowel and colon, including the distal rectum. Small bowel loops appear prominent but non dilated. No frank pneumoperitoneum. Percutaneous gastrostomy tube with retention balloon projecting over the epigastric region. IMPRESSION: 1. Bowel-gas pattern remains most suggestive of an ileus, as above. Electronically Signed   By: Vinnie Langton M.D.   On: 06/03/2022 06:11    Assessment/Plan Active Problems:   ALS (amyotrophic lateral sclerosis) (HCC)   Cardiac arrest (HCC)   Pseudomonas pneumonia (HCC)   Autonomic dysfunction   Acute on chronic respiratory failure with hypoxia (HCC)   Acute on chronic respiratory failure hypoxia patient is at baseline on assist control mode ALS advanced severe disease Pseudomonas pneumonia has been treated we will continue to monitor Autonomic dysfunction stable baseline Cardiac arrest rhythm has been stable we will continue to monitor   I have personally seen and evaluated the patient, evaluated laboratory and imaging results, formulated the assessment and plan and placed orders. The Patient requires high complexity decision making with multiple systems involvement.  Rounds were done with the Respiratory Therapy Director and Staff therapists and discussed with nursing staff also.  Allyne Gee, MD [facility] Pulmonary Critical Care Medicine Sleep Medicine

## 2022-06-05 ENCOUNTER — Other Ambulatory Visit (HOSPITAL_COMMUNITY): Payer: Self-pay

## 2022-06-05 DIAGNOSIS — K6389 Other specified diseases of intestine: Secondary | ICD-10-CM | POA: Diagnosis not present

## 2022-06-05 LAB — COMPREHENSIVE METABOLIC PANEL
ALT: 87 U/L — ABNORMAL HIGH (ref 0–44)
AST: 36 U/L (ref 15–41)
Albumin: 3.3 g/dL — ABNORMAL LOW (ref 3.5–5.0)
Alkaline Phosphatase: 138 U/L — ABNORMAL HIGH (ref 38–126)
Anion gap: 9 (ref 5–15)
BUN: 19 mg/dL (ref 6–20)
CO2: 22 mmol/L (ref 22–32)
Calcium: 9.8 mg/dL (ref 8.9–10.3)
Chloride: 107 mmol/L (ref 98–111)
Creatinine, Ser: <0.3 mg/dL — ABNORMAL LOW (ref 0.61–1.24)
Glucose, Bld: 146 mg/dL — ABNORMAL HIGH (ref 70–99)
Potassium: 3.7 mmol/L (ref 3.5–5.1)
Sodium: 138 mmol/L (ref 135–145)
Total Bilirubin: 0.5 mg/dL (ref 0.3–1.2)
Total Protein: 7.8 g/dL (ref 6.5–8.1)

## 2022-06-05 LAB — CBC WITH DIFFERENTIAL/PLATELET
Abs Immature Granulocytes: 0.03 10*3/uL (ref 0.00–0.07)
Basophils Absolute: 0 10*3/uL (ref 0.0–0.1)
Basophils Relative: 0 %
Eosinophils Absolute: 0.3 10*3/uL (ref 0.0–0.5)
Eosinophils Relative: 3 %
HCT: 32.1 % — ABNORMAL LOW (ref 39.0–52.0)
Hemoglobin: 10 g/dL — ABNORMAL LOW (ref 13.0–17.0)
Immature Granulocytes: 0 %
Lymphocytes Relative: 11 %
Lymphs Abs: 1 10*3/uL (ref 0.7–4.0)
MCH: 26.3 pg (ref 26.0–34.0)
MCHC: 31.2 g/dL (ref 30.0–36.0)
MCV: 84.5 fL (ref 80.0–100.0)
Monocytes Absolute: 0.5 10*3/uL (ref 0.1–1.0)
Monocytes Relative: 5 %
Neutro Abs: 7.2 10*3/uL (ref 1.7–7.7)
Neutrophils Relative %: 81 %
Platelets: 184 10*3/uL (ref 150–400)
RBC: 3.8 MIL/uL — ABNORMAL LOW (ref 4.22–5.81)
RDW: 18 % — ABNORMAL HIGH (ref 11.5–15.5)
WBC: 9.1 10*3/uL (ref 4.0–10.5)
nRBC: 0 % (ref 0.0–0.2)

## 2022-06-05 LAB — MAGNESIUM: Magnesium: 1.9 mg/dL (ref 1.7–2.4)

## 2022-06-05 LAB — PHOSPHORUS: Phosphorus: 3.5 mg/dL (ref 2.5–4.6)

## 2022-06-06 NOTE — Progress Notes (Signed)
Pulmonary Critical Care Medicine [place]   PULMONARY CRITICAL CARE SERVICE  PROGRESS NOTE     Sederick Jacobsen  KGU:[id number]  DOB: 02-21-1971   DOA: 05/19/2022  Referring Physician: Satira Sark, MD  HPI: Jarome Trull is a 51 y.o. male being followed for ventilator/airway/oxygen weaning Acute on Chronic Respiratory Failure.  Patient is on assist-control on 28% FiO2 saturations are good  Medications: Reviewed on Rounds  Physical Exam:  Vitals: Temperature is 97.8 pulse 78 respiratory rate is 20 blood pressure is 122/80 saturation 97%  Ventilator Settings assist-control FiO2 is 28% tidal volume 500 PEEP 5  General: Comfortable at this time Neck: supple Cardiovascular: no malignant arrhythmias Respiratory: Coarse rhonchi noted Skin: no rash seen on limited exam Musculoskeletal: No gross abnormality Psychiatric:unable to assess Neurologic:no involuntary movements         Lab Data:   Basic Metabolic Panel: Recent Labs  Lab 05/31/22 0153 06/02/22 0241 06/05/22 0252  NA 138 133* 138  K 3.7 3.5 3.7  CL 106 105 107  CO2 21* 22 22  GLUCOSE 144* 133* 146*  BUN 22* 22* 19  CREATININE <0.30* <0.30* <0.30*  CALCIUM 9.6 9.2 9.8  MG 1.9 1.7 1.9  PHOS  --   --  3.5    ABG: No results for input(s): "PHART", "PCO2ART", "PO2ART", "HCO3", "O2SAT" in the last 168 hours.  Liver Function Tests: Recent Labs  Lab 06/05/22 0252  AST 36  ALT 87*  ALKPHOS 138*  BILITOT 0.5  PROT 7.8  ALBUMIN 3.3*   No results for input(s): "LIPASE", "AMYLASE" in the last 168 hours. No results for input(s): "AMMONIA" in the last 168 hours.  CBC: Recent Labs  Lab 05/31/22 0153 06/02/22 0241 06/05/22 0252  WBC 6.5 6.4 9.1  NEUTROABS  --   --  7.2  HGB 10.0* 9.5* 10.0*  HCT 31.6* 29.9* 32.1*  MCV 84.5 85.2 84.5  PLT 150 148* 184    Cardiac Enzymes: No results for input(s): "CKTOTAL", "CKMB", "CKMBINDEX", "TROPONINI" in the last 168 hours.  BNP (last 3  results) No results for input(s): "BNP" in the last 8760 hours.  ProBNP (last 3 results) No results for input(s): "PROBNP" in the last 8760 hours.  Radiological Exams: DG Abd Portable 1V  Result Date: 06/05/2022 CLINICAL DATA:  Ileus EXAM: PORTABLE ABDOMEN - 1 VIEW COMPARISON:  Prior abdominal radiograph 06/03/2022 FINDINGS: Gastrostomy tube remains in unchanged position. Slight interval decrease in diffuse gaseous distension of small and large bowel throughout the abdomen. No large free air. IMPRESSION: Slight interval improvement in diffuse gaseous distension of large and small bowel throughout the abdomen compared to 06/03/2022. Electronically Signed   By: Jacqulynn Cadet M.D.   On: 06/05/2022 08:13    Assessment/Plan Active Problems:   ALS (amyotrophic lateral sclerosis) (HCC)   Cardiac arrest (HCC)   Pseudomonas pneumonia (HCC)   Autonomic dysfunction   Acute on chronic respiratory failure with hypoxia (HCC)   Acute on chronic respiratory failure hypoxia will continue with full support on mechanical ventilation not weanable ALS advanced severe disease will continue to follow along closely Autonomic dysfunction no change will continue with supportive care Cardiac arrest rhythm is stable at this time Pseudomonas pneumonia has been treated with antibiotics   I have personally seen and evaluated the patient, evaluated laboratory and imaging results, formulated the assessment and plan and placed orders. The Patient requires high complexity decision making with multiple systems involvement.  Rounds were done with the Respiratory Therapy Director and  Staff therapists and discussed with nursing staff also.  Allyne Gee, MD [facility] Pulmonary Critical Care Medicine Sleep Medicine

## 2022-06-08 ENCOUNTER — Other Ambulatory Visit (HOSPITAL_COMMUNITY): Payer: Self-pay

## 2022-06-08 DIAGNOSIS — K6389 Other specified diseases of intestine: Secondary | ICD-10-CM | POA: Diagnosis not present

## 2022-06-08 LAB — PHOSPHORUS: Phosphorus: 3.3 mg/dL (ref 2.5–4.6)

## 2022-06-08 LAB — BASIC METABOLIC PANEL
Anion gap: 7 (ref 5–15)
Anion gap: 8 (ref 5–15)
BUN: 24 mg/dL — ABNORMAL HIGH (ref 6–20)
BUN: 20 mg/dL (ref 6–20)
CO2: 22 mmol/L (ref 22–32)
CO2: 23 mmol/L (ref 22–32)
Calcium: 9.7 mg/dL (ref 8.9–10.3)
Calcium: 9.7 mg/dL (ref 8.9–10.3)
Chloride: 111 mmol/L (ref 98–111)
Chloride: 110 mmol/L (ref 98–111)
Creatinine, Ser: <0.3 mg/dL — ABNORMAL LOW (ref 0.61–1.24)
Creatinine, Ser: <0.3 mg/dL — ABNORMAL LOW (ref 0.61–1.24)
Glucose, Bld: 137 mg/dL — ABNORMAL HIGH (ref 70–99)
Glucose, Bld: 135 mg/dL — ABNORMAL HIGH (ref 70–99)
Potassium: 3.7 mmol/L (ref 3.5–5.1)
Potassium: 3.8 mmol/L (ref 3.5–5.1)
Sodium: 140 mmol/L (ref 135–145)
Sodium: 141 mmol/L (ref 135–145)

## 2022-06-08 LAB — TRIGLYCERIDES: Triglycerides: 30 mg/dL (ref <150)

## 2022-06-08 LAB — CBC
HCT: 29.9 % — ABNORMAL LOW (ref 39.0–52.0)
HCT: 30.8 % — ABNORMAL LOW (ref 39.0–52.0)
Hemoglobin: 9.5 g/dL — ABNORMAL LOW (ref 13.0–17.0)
Hemoglobin: 9.4 g/dL — ABNORMAL LOW (ref 13.0–17.0)
MCH: 26.9 pg (ref 26.0–34.0)
MCH: 26.6 pg (ref 26.0–34.0)
MCHC: 31.8 g/dL (ref 30.0–36.0)
MCHC: 30.5 g/dL (ref 30.0–36.0)
MCV: 84.7 fL (ref 80.0–100.0)
MCV: 87 fL (ref 80.0–100.0)
Platelets: 147 10*3/uL — ABNORMAL LOW (ref 150–400)
Platelets: 169 10*3/uL (ref 150–400)
RBC: 3.53 MIL/uL — ABNORMAL LOW (ref 4.22–5.81)
RBC: 3.54 MIL/uL — ABNORMAL LOW (ref 4.22–5.81)
RDW: 18.2 % — ABNORMAL HIGH (ref 11.5–15.5)
RDW: 18.1 % — ABNORMAL HIGH (ref 11.5–15.5)
WBC: 6.2 10*3/uL (ref 4.0–10.5)
WBC: 5.1 10*3/uL (ref 4.0–10.5)
nRBC: 0 % (ref 0.0–0.2)
nRBC: 0 % (ref 0.0–0.2)

## 2022-06-08 LAB — PREALBUMIN: Prealbumin: 19 mg/dL (ref 18–38)

## 2022-06-08 LAB — MAGNESIUM: Magnesium: 2 mg/dL (ref 1.7–2.4)

## 2022-06-09 NOTE — Progress Notes (Cosign Needed Addendum)
Pulmonary Critical Care Medicine [place]   PULMONARY CRITICAL CARE SERVICE  PROGRESS NOTE     Jerry [name]  GHW:[id number]  DOB: [birth year]   DOA: 05/19/2022  Referring Physician: Satira Sark, MD  HPI: Jerry [name] is a 51 y.o. male being followed for ventilator/airway/oxygen weaning Acute on Chronic Respiratory Failure.  Patient seen lying in bed, currently remains on baseline of full support ventilation.  Medications: Reviewed on Rounds  Physical Exam:  Vitals: Temp 97.3, pulse 69, respirations 20, BP 155/80, SpO2 99%  Ventilator Settings AC VC, FiO2 28%, tidal volume 500, rate 16, PEEP 5  General: Comfortable at this time Neck: supple Cardiovascular: no malignant arrhythmias Respiratory: Bilaterally diminished Skin: no rash seen on limited exam Musculoskeletal: No gross abnormality Psychiatric:unable to assess Neurologic:no involuntary movements         Lab Data:   Basic Metabolic Panel: Recent Labs  Lab 06/05/22 0252 06/08/22 0242 06/08/22 1335  NA 138 140 141  K 3.7 3.7 3.8  CL 107 111 110  CO2 '22 22 23  '$ GLUCOSE 146* 137* 135*  BUN 19 24* 20  CREATININE <0.30* <0.30* <0.30*  CALCIUM 9.8 9.7 9.7  MG 1.9 2.0  --   PHOS 3.5 3.3  --     ABG: No results for input(s): "PHART", "PCO2ART", "PO2ART", "HCO3", "O2SAT" in the last 168 hours.  Liver Function Tests: Recent Labs  Lab 06/05/22 0252  AST 36  ALT 87*  ALKPHOS 138*  BILITOT 0.5  PROT 7.8  ALBUMIN 3.3*   No results for input(s): "LIPASE", "AMYLASE" in the last 168 hours. No results for input(s): "AMMONIA" in the last 168 hours.  CBC: Recent Labs  Lab 06/05/22 0252 06/08/22 0242 06/08/22 1335  WBC 9.1 6.2 5.1  NEUTROABS 7.2  --   --   HGB 10.0* 9.5* 9.4*  HCT 32.1* 29.9* 30.8*  MCV 84.5 84.7 87.0  PLT 184 147* 169    Cardiac Enzymes: No results for input(s): "CKTOTAL", "CKMB", "CKMBINDEX", "TROPONINI" in the last 168 hours.  BNP (last 3 results) No  results for input(s): "BNP" in the last 8760 hours.  ProBNP (last 3 results) No results for input(s): "PROBNP" in the last 8760 hours.  Radiological Exams: DG Abd 1 View  Result Date: 06/08/2022 CLINICAL DATA:  Ileus. EXAM: ABDOMEN - 1 VIEW COMPARISON:  June 05, 2022. FINDINGS: Gastrostomy tube is noted in left upper quadrant. No significant small bowel dilatation is noted. Mild gaseous distention of the colon is noted suggesting possible ileus. IMPRESSION: Mild gaseous distention of the colon suggesting possible ileus. Electronically Signed   By: Marijo Conception M.D.   On: 06/08/2022 14:03    Assessment/Plan Active Problems:   ALS (amyotrophic lateral sclerosis) (HCC)   Cardiac arrest (HCC)   Pseudomonas pneumonia (HCC)   Autonomic dysfunction   Acute on chronic respiratory failure with hypoxia (HCC)   Acute on chronic respiratory failure hypoxia patient is at baseline on assist control mode ALS advanced severe disease Pseudomonas pneumonia has been treated we will continue to monitor Autonomic dysfunction stable baseline Cardiac arrest rhythm has been stable we will continue to monitor   I have personally seen and evaluated the patient, evaluated laboratory and imaging results, formulated the assessment and plan and placed orders. The Patient requires high complexity decision making with multiple systems involvement.  Rounds were done with the Respiratory Therapy Director and Staff therapists and discussed with nursing staff also.  Allyne Gee, MD [facility] Pulmonary Critical Care  Medicine Sleep Medicine

## 2022-06-09 NOTE — Progress Notes (Addendum)
Pulmonary Critical Care Medicine [place]   PULMONARY CRITICAL CARE SERVICE  PROGRESS NOTE     Jerry [name]  [id number]:[id number]  DOB: 10-17-1970   DOA: 05/19/2022  Referring Physician: Satira Sark, MD  HPI: Jerry [name] is a 51 y.o. male being followed for ventilator/airway/oxygen weaning Acute on Chronic Respiratory Failure.  Patient seen lying in bed, currently remains on baseline full support ventilation.  Medications: Reviewed on Rounds  Physical Exam:  Vitals: 7.6, pulse 72, respirations 18, BP 121/80, SpO2 100%  Ventilator Settings AC VC, FiO2 20%, tidal volume 500, rate 16, PEEP 5  General: Comfortable at this time Neck: supple Cardiovascular: no malignant arrhythmias Respiratory: Bilaterally diminished Skin: no rash seen on limited exam Musculoskeletal: No gross abnormality Psychiatric:unable to assess Neurologic:no involuntary movements         Lab Data:   Basic Metabolic Panel: Recent Labs  Lab 06/05/22 0252 06/08/22 0242 06/08/22 1335  NA 138 140 141  K 3.7 3.7 3.8  CL 107 111 110  CO2 '22 22 23  '$ GLUCOSE 146* 137* 135*  BUN 19 24* 20  CREATININE <0.30* <0.30* <0.30*  CALCIUM 9.8 9.7 9.7  MG 1.9 2.0  --   PHOS 3.5 3.3  --     ABG: No results for input(s): "PHART", "PCO2ART", "PO2ART", "HCO3", "O2SAT" in the last 168 hours.  Liver Function Tests: Recent Labs  Lab 06/05/22 0252  AST 36  ALT 87*  ALKPHOS 138*  BILITOT 0.5  PROT 7.8  ALBUMIN 3.3*   No results for input(s): "LIPASE", "AMYLASE" in the last 168 hours. No results for input(s): "AMMONIA" in the last 168 hours.  CBC: Recent Labs  Lab 06/05/22 0252 06/08/22 0242 06/08/22 1335  WBC 9.1 6.2 5.1  NEUTROABS 7.2  --   --   HGB 10.0* 9.5* 9.4*  HCT 32.1* 29.9* 30.8*  MCV 84.5 84.7 87.0  PLT 184 147* 169    Cardiac Enzymes: No results for input(s): "CKTOTAL", "CKMB", "CKMBINDEX", "TROPONINI" in the last 168 hours.  BNP (last 3 results) No results  for input(s): "BNP" in the last 8760 hours.  ProBNP (last 3 results) No results for input(s): "PROBNP" in the last 8760 hours.  Radiological Exams: DG Abd 1 View  Result Date: 06/08/2022 CLINICAL DATA:  Ileus. EXAM: ABDOMEN - 1 VIEW COMPARISON:  June 05, 2022. FINDINGS: Gastrostomy tube is noted in left upper quadrant. No significant small bowel dilatation is noted. Mild gaseous distention of the colon is noted suggesting possible ileus. IMPRESSION: Mild gaseous distention of the colon suggesting possible ileus. Electronically Signed   By: Marijo Conception M.D.   On: 06/08/2022 14:03    Assessment/Plan Active Problems:   ALS (amyotrophic lateral sclerosis) (HCC)   Cardiac arrest (HCC)   Pseudomonas pneumonia (HCC)   Autonomic dysfunction   Acute on chronic respiratory failure with hypoxia (HCC)  Acute on chronic respiratory failure hypoxia patient is at baseline on assist control mode ALS advanced severe disease Pseudomonas pneumonia has been treated we will continue to monitor Autonomic dysfunction stable baseline Cardiac arrest rhythm has been stable we will continue to monitor   I have personally seen and evaluated the patient, evaluated laboratory and imaging results, formulated the assessment and plan and placed orders. The Patient requires high complexity decision making with multiple systems involvement.  Rounds were done with the Respiratory Therapy Director and Staff therapists and discussed with nursing staff also.  Allyne Gee, MD [facility] Pulmonary Critical Care Medicine Sleep Medicine

## 2022-06-09 NOTE — Progress Notes (Cosign Needed)
Pulmonary Critical Care Medicine [place]   PULMONARY CRITICAL CARE SERVICE  PROGRESS NOTE     Jerry [name]  [id number]:[id number]  DOB: [birth year]   DOA: 05/19/2022  Referring Physician: Satira Sark, MD  HPI: Jerry [name] is a 51 y.o. male being followed for ventilator/airway/oxygen weaning Acute on Chronic Respiratory Failure.  Patient seen lying in bed, currently remains on baseline of full support.  Medications: Reviewed on Rounds  Physical Exam:  Vitals: Temp 97.5, pulse 61, respirations 20, BP 105/52, SpO2 100%  Ventilator Settings AC VC, FiO2 28%, tidal volume 500, rate 16, PEEP 5  General: Comfortable at this time Neck: supple Cardiovascular: no malignant arrhythmias Respiratory: Bilaterally diminished Skin: no rash seen on limited exam Musculoskeletal: No gross abnormality Psychiatric:unable to assess Neurologic:no involuntary movements         Lab Data:   Basic Metabolic Panel: Recent Labs  Lab 06/05/22 0252 06/08/22 0242 06/08/22 1335  NA 138 140 141  K 3.7 3.7 3.8  CL 107 111 110  CO2 '22 22 23  '$ GLUCOSE 146* 137* 135*  BUN 19 24* 20  CREATININE <0.30* <0.30* <0.30*  CALCIUM 9.8 9.7 9.7  MG 1.9 2.0  --   PHOS 3.5 3.3  --     ABG: No results for input(s): "PHART", "PCO2ART", "PO2ART", "HCO3", "O2SAT" in the last 168 hours.  Liver Function Tests: Recent Labs  Lab 06/05/22 0252  AST 36  ALT 87*  ALKPHOS 138*  BILITOT 0.5  PROT 7.8  ALBUMIN 3.3*   No results for input(s): "LIPASE", "AMYLASE" in the last 168 hours. No results for input(s): "AMMONIA" in the last 168 hours.  CBC: Recent Labs  Lab 06/05/22 0252 06/08/22 0242 06/08/22 1335  WBC 9.1 6.2 5.1  NEUTROABS 7.2  --   --   HGB 10.0* 9.5* 9.4*  HCT 32.1* 29.9* 30.8*  MCV 84.5 84.7 87.0  PLT 184 147* 169    Cardiac Enzymes: No results for input(s): "CKTOTAL", "CKMB", "CKMBINDEX", "TROPONINI" in the last 168 hours.  BNP (last 3 results) No results for  input(s): "BNP" in the last 8760 hours.  ProBNP (last 3 results) No results for input(s): "PROBNP" in the last 8760 hours.  Radiological Exams: DG Abd 1 View  Result Date: 06/08/2022 CLINICAL DATA:  Ileus. EXAM: ABDOMEN - 1 VIEW COMPARISON:  June 05, 2022. FINDINGS: Gastrostomy tube is noted in left upper quadrant. No significant small bowel dilatation is noted. Mild gaseous distention of the colon is noted suggesting possible ileus. IMPRESSION: Mild gaseous distention of the colon suggesting possible ileus. Electronically Signed   By: Marijo Conception M.D.   On: 06/08/2022 14:03    Assessment/Plan Active Problems:   ALS (amyotrophic lateral sclerosis) (HCC)   Cardiac arrest (HCC)   Pseudomonas pneumonia (HCC)   Autonomic dysfunction   Acute on chronic respiratory failure with hypoxia (HCC)   Acute on chronic respiratory failure hypoxia patient is at baseline on assist control mode ALS advanced severe disease Pseudomonas pneumonia has been treated we will continue to monitor Autonomic dysfunction stable baseline Cardiac arrest rhythm has been stable we will continue to monitor   I have personally seen and evaluated the patient, evaluated laboratory and imaging results, formulated the assessment and plan and placed orders. The Patient requires high complexity decision making with multiple systems involvement.  Rounds were done with the Respiratory Therapy Director and Staff therapists and discussed with nursing staff also.  Allyne Gee, MD [facility] Pulmonary Critical Care Medicine  Sleep Medicine

## 2022-06-09 NOTE — Progress Notes (Cosign Needed)
Pulmonary Critical Care Medicine [place]   PULMONARY CRITICAL CARE SERVICE  PROGRESS NOTE     Jerry [name]  ZDG:[id number]  DOB: 1971-05-06   DOA: 05/19/2022  Referring Physician: Satira Sark, MD  HPI: Jerry [name] is a 51 y.o. male being followed for ventilator/airway/oxygen weaning Acute on Chronic Respiratory Failure.  Patient seen lying in bed, currently mains on baseline of full support ventilation.  Continue with family training.  Medications: Reviewed on Rounds  Physical Exam:  Vitals: Temp 99.6, pulse 72, respirations 20, BP 122/76, SpO2 100%  Ventilator Settings AC VC, FiO2 28%, tidal volume 500, rate 16, PEEP 5  General: Comfortable at this time Neck: supple Cardiovascular: no malignant arrhythmias Respiratory: Bilaterally diminished Skin: no rash seen on limited exam Musculoskeletal: No gross abnormality Psychiatric:unable to assess Neurologic:no involuntary movements         Lab Data:   Basic Metabolic Panel: Recent Labs  Lab 06/05/22 0252 06/08/22 0242 06/08/22 1335  NA 138 140 141  K 3.7 3.7 3.8  CL 107 111 110  CO2 '22 22 23  '$ GLUCOSE 146* 137* 135*  BUN 19 24* 20  CREATININE <0.30* <0.30* <0.30*  CALCIUM 9.8 9.7 9.7  MG 1.9 2.0  --   PHOS 3.5 3.3  --     ABG: No results for input(s): "PHART", "PCO2ART", "PO2ART", "HCO3", "O2SAT" in the last 168 hours.  Liver Function Tests: Recent Labs  Lab 06/05/22 0252  AST 36  ALT 87*  ALKPHOS 138*  BILITOT 0.5  PROT 7.8  ALBUMIN 3.3*   No results for input(s): "LIPASE", "AMYLASE" in the last 168 hours. No results for input(s): "AMMONIA" in the last 168 hours.  CBC: Recent Labs  Lab 06/05/22 0252 06/08/22 0242 06/08/22 1335  WBC 9.1 6.2 5.1  NEUTROABS 7.2  --   --   HGB 10.0* 9.5* 9.4*  HCT 32.1* 29.9* 30.8*  MCV 84.5 84.7 87.0  PLT 184 147* 169    Cardiac Enzymes: No results for input(s): "CKTOTAL", "CKMB", "CKMBINDEX", "TROPONINI" in the last 168  hours.  BNP (last 3 results) No results for input(s): "BNP" in the last 8760 hours.  ProBNP (last 3 results) No results for input(s): "PROBNP" in the last 8760 hours.  Radiological Exams: DG Abd 1 View  Result Date: 06/08/2022 CLINICAL DATA:  Ileus. EXAM: ABDOMEN - 1 VIEW COMPARISON:  June 05, 2022. FINDINGS: Gastrostomy tube is noted in left upper quadrant. No significant small bowel dilatation is noted. Mild gaseous distention of the colon is noted suggesting possible ileus. IMPRESSION: Mild gaseous distention of the colon suggesting possible ileus. Electronically Signed   By: Marijo Conception M.D.   On: 06/08/2022 14:03    Assessment/Plan Active Problems:   ALS (amyotrophic lateral sclerosis) (HCC)   Cardiac arrest (HCC)   Pseudomonas pneumonia (HCC)   Autonomic dysfunction   Acute on chronic respiratory failure with hypoxia (HCC)  Acute on chronic respiratory failure hypoxia patient is at baseline on assist control mode ALS advanced severe disease Pseudomonas pneumonia has been treated we will continue to monitor Autonomic dysfunction stable baseline Cardiac arrest rhythm has been stable we will continue to monitor   I have personally seen and evaluated the patient, evaluated laboratory and imaging results, formulated the assessment and plan and placed orders. The Patient requires high complexity decision making with multiple systems involvement.  Rounds were done with the Respiratory Therapy Director and Staff therapists and discussed with nursing staff also.  Allyne Gee, MD  [facility] Pulmonary Critical Care Medicine Sleep Medicine

## 2022-06-10 ENCOUNTER — Other Ambulatory Visit (HOSPITAL_COMMUNITY): Payer: Self-pay

## 2022-06-10 NOTE — Progress Notes (Signed)
Pulmonary Critical Care Medicine [place]   PULMONARY CRITICAL CARE SERVICE  PROGRESS NOTE     Jerry [name]  [id number]  DOB: 02/11/1971   DOA: 05/19/2022  Referring Physician: Satira Sark, MD  HPI: Jerry [name] is a 51 y.o. male being followed for ventilator/airway/oxygen weaning Acute on Chronic Respiratory Failure.  Patient resting comfortably without distress has been on assist-control mode currently on 30% FiO2 saturations are good  Medications: Reviewed on Rounds  Physical Exam:  Vitals: Temperature is 96.8 pulse of 67 respiratory rate is 23 blood pressure 135/62 saturation is 98%  Ventilator Settings pressure assist control FiO2 40% IP 20 PEEP of 5  General: Comfortable at this time Neck: supple Cardiovascular: no malignant arrhythmias Respiratory: No rhonchi very coarse breath sounds Skin: no rash seen on limited exam Musculoskeletal: No gross abnormality Psychiatric:unable to assess Neurologic:no involuntary movements         Lab Data:   Basic Metabolic Panel: Recent Labs  Lab 06/05/22 0252 06/08/22 0242 06/08/22 1335  NA 138 140 141  K 3.7 3.7 3.8  CL 107 111 110  CO2 '22 22 23  '$ GLUCOSE 146* 137* 135*  BUN 19 24* 20  CREATININE <0.30* <0.30* <0.30*  CALCIUM 9.8 9.7 9.7  MG 1.9 2.0  --   PHOS 3.5 3.3  --     ABG: No results for input(s): "PHART", "PCO2ART", "PO2ART", "HCO3", "O2SAT" in the last 168 hours.  Liver Function Tests: Recent Labs  Lab 06/05/22 0252  AST 36  ALT 87*  ALKPHOS 138*  BILITOT 0.5  PROT 7.8  ALBUMIN 3.3*   No results for input(s): "LIPASE", "AMYLASE" in the last 168 hours. No results for input(s): "AMMONIA" in the last 168 hours.  CBC: Recent Labs  Lab 06/05/22 0252 06/08/22 0242 06/08/22 1335  WBC 9.1 6.2 5.1  NEUTROABS 7.2  --   --   HGB 10.0* 9.5* 9.4*  HCT 32.1* 29.9* 30.8*  MCV 84.5 84.7 87.0  PLT 184 147* 169    Cardiac Enzymes: No results for input(s): "CKTOTAL",  "CKMB", "CKMBINDEX", "TROPONINI" in the last 168 hours.  BNP (last 3 results) No results for input(s): "BNP" in the last 8760 hours.  ProBNP (last 3 results) No results for input(s): "PROBNP" in the last 8760 hours.  Radiological Exams: DG Abd 1 View  Result Date: 06/10/2022 CLINICAL DATA:  51 year old male with chronic ileus. EXAM: ABDOMEN - 1 VIEW COMPARISON:  06/08/2022 abdominal radiographs, CT Abdomen and Pelvis 03/28/2022. FINDINGS: Portable AP supine view at 0613 hours. Left upper quadrant gastrostomy tube and diffuse gas containing small and large bowel loops throughout the abdomen and pelvis appear unchanged from the CT scout view 03/28/2022. Chronic elevation of the left hemidiaphragm. Right lung base appears negative. Stable visualized osseous structures. IMPRESSION: Stable gastrostomy tube and diffuse ileus bowel gas pattern since September. Electronically Signed   By: Genevie Ann M.D.   On: 06/10/2022 07:24   DG Abd 1 View  Result Date: 06/08/2022 CLINICAL DATA:  Ileus. EXAM: ABDOMEN - 1 VIEW COMPARISON:  June 05, 2022. FINDINGS: Gastrostomy tube is noted in left upper quadrant. No significant small bowel dilatation is noted. Mild gaseous distention of the colon is noted suggesting possible ileus. IMPRESSION: Mild gaseous distention of the colon suggesting possible ileus. Electronically Signed   By: Marijo Conception M.D.   On: 06/08/2022 14:03    Assessment/Plan Active Problems:   ALS (amyotrophic lateral sclerosis) (HCC)   Cardiac arrest (HCC)   Pseudomonas  pneumonia ([place])   Autonomic dysfunction   Acute on chronic respiratory failure with hypoxia (HCC)   Acute on chronic respiratory failure hypoxia will continue with full support on the ventilator.  The family is undergoing training Cardiac arrest rhythm is stable we will continue to monitor Pseudomonas pneumonia has been treated with antibiotics Autonomic dysfunction no change ALS advanced severe disease   I have  personally seen and evaluated the patient, evaluated laboratory and imaging results, formulated the assessment and plan and placed orders. The Patient requires high complexity decision making with multiple systems involvement.  Rounds were done with the Respiratory Therapy Director and Staff therapists and discussed with nursing staff also.  Allyne Gee, MD [facility] Pulmonary Critical Care Medicine Sleep Medicine

## 2022-06-11 ENCOUNTER — Telehealth: Payer: Self-pay

## 2022-06-11 LAB — PHOSPHORUS: Phosphorus: 3.7 mg/dL (ref 2.5–4.6)

## 2022-06-11 LAB — MAGNESIUM: Magnesium: 2.1 mg/dL (ref 1.7–2.4)

## 2022-06-11 NOTE — Telephone Encounter (Unsigned)
  Care Management   Visit Note  06/11/2022 Name: Jerry [name] MRN: [id number] DOB: 1971/02/07  Subjective: Jerry [name] is a 51 y.o. year old male who is a primary care patient of [name], MD. The Care Management team was consulted for assistance.      Engaged with patient's caregiver/spouse by telephone.  Assessment:  Call received from patient's spouse. Reports receiving confirmation that patient is scheduled for discharge next week.   Interventions:   Confirmed that the following services have been arranged: -Anticipated Home Health services with [facility] -Infusion services for [employer] [facility] via PICC with [facility] -Respiratory services/tracheostomy via [facility]  Recommendations:    Per [facility], spouse and additional caregiver will be required to complete training at the facility prior to patient's discharge. Spouse advised to complete required caregiver training as planned on Monday June 14, 2022.   PLAN:   Will follow up next week    [place] Manager/Chronic Care Management [phone]

## 2022-06-11 NOTE — Progress Notes (Signed)
Pulmonary Critical Care Medicine [place]   PULMONARY CRITICAL CARE SERVICE  PROGRESS NOTE     Jerry [name]  NAT:[id number]  DOB: [birth year]   DOA: 05/19/2022  Referring Physician: Satira Sark, MD  HPI: Jerry [name] is a 51 y.o. male being followed for ventilator/airway/oxygen weaning Acute on Chronic Respiratory Failure.  Patient is undergoing home training family has been doing well with that  Medications: Reviewed on Rounds  Physical Exam:  Vitals: Temperature is 97.1 pulse 63 respiratory 18 blood pressure 101/64 saturation is 100%  Ventilator Settings assist-control FiO2 is 28% tidal volume 500  General: Comfortable at this time Neck: supple Cardiovascular: no malignant arrhythmias Respiratory: Scattered rhonchi expansion is equal Skin: no rash seen on limited exam Musculoskeletal: No gross abnormality Psychiatric:unable to assess Neurologic:no involuntary movements         Lab Data:   Basic Metabolic Panel: Recent Labs  Lab 06/05/22 0252 06/08/22 0242 06/08/22 1335 06/11/22 0201  NA 138 140 141  --   K 3.7 3.7 3.8  --   CL 107 111 110  --   CO2 '22 22 23  '$ --   GLUCOSE 146* 137* 135*  --   BUN 19 24* 20  --   CREATININE <0.30* <0.30* <0.30*  --   CALCIUM 9.8 9.7 9.7  --   MG 1.9 2.0  --  2.1  PHOS 3.5 3.3  --  3.7    ABG: No results for input(s): "PHART", "PCO2ART", "PO2ART", "HCO3", "O2SAT" in the last 168 hours.  Liver Function Tests: Recent Labs  Lab 06/05/22 0252  AST 36  ALT 87*  ALKPHOS 138*  BILITOT 0.5  PROT 7.8  ALBUMIN 3.3*   No results for input(s): "LIPASE", "AMYLASE" in the last 168 hours. No results for input(s): "AMMONIA" in the last 168 hours.  CBC: Recent Labs  Lab 06/05/22 0252 06/08/22 0242 06/08/22 1335  WBC 9.1 6.2 5.1  NEUTROABS 7.2  --   --   HGB 10.0* 9.5* 9.4*  HCT 32.1* 29.9* 30.8*  MCV 84.5 84.7 87.0  PLT 184 147* 169    Cardiac Enzymes: No results for input(s): "CKTOTAL",  "CKMB", "CKMBINDEX", "TROPONINI" in the last 168 hours.  BNP (last 3 results) No results for input(s): "BNP" in the last 8760 hours.  ProBNP (last 3 results) No results for input(s): "PROBNP" in the last 8760 hours.  Radiological Exams: DG Abd 1 View  Result Date: 06/10/2022 CLINICAL DATA:  51 year old male with chronic ileus. EXAM: ABDOMEN - 1 VIEW COMPARISON:  06/08/2022 abdominal radiographs, CT Abdomen and Pelvis 03/28/2022. FINDINGS: Portable AP supine view at 0613 hours. Left upper quadrant gastrostomy tube and diffuse gas containing small and large bowel loops throughout the abdomen and pelvis appear unchanged from the CT scout view 03/28/2022. Chronic elevation of the left hemidiaphragm. Right lung base appears negative. Stable visualized osseous structures. IMPRESSION: Stable gastrostomy tube and diffuse ileus bowel gas pattern since September. Electronically Signed   By: Genevie Ann M.D.   On: 06/10/2022 07:24    Assessment/Plan Active Problems:   ALS (amyotrophic lateral sclerosis) (HCC)   Cardiac arrest (HCC)   Pseudomonas pneumonia (HCC)   Autonomic dysfunction   Acute on chronic respiratory failure with hypoxia (HCC)   Acute on chronic respiratory failure with hypoxia continue with the T collar is continue secretion management pulmonary toilet. Cardiac arrest rhythm is stable we will continue to monitor closely. Pseudomonas pneumonia has been treated with antibiotics Autonomic dysfunction no change will continue  to monitor ALS advanced severe disease continue with supportive care   I have personally seen and evaluated the patient, evaluated laboratory and imaging results, formulated the assessment and plan and placed orders. The Patient requires high complexity decision making with multiple systems involvement.  Rounds were done with the Respiratory Therapy Director and Staff therapists and discussed with nursing staff also.  Allyne Gee, MD [facility] Pulmonary Critical Care  Medicine Sleep Medicine

## 2022-06-12 NOTE — Progress Notes (Signed)
Pulmonary Critical Care Medicine [place]   PULMONARY CRITICAL CARE SERVICE  PROGRESS NOTE     Jerry [name]  KZL:[id number]  DOB: 1970-09-05   DOA: 05/19/2022  Referring Physician: Satira Sark, MD  HPI: Jerry [name] is a 51 y.o. male being followed for ventilator/airway/oxygen weaning Acute on Chronic Respiratory Failure.  Patient is currently on assist-control mode has been on 28% FiO2 saturations are good  Medications: Reviewed on Rounds  Physical Exam:  Vitals: Temperature is 97.7 pulse 63 respiratory rate is 21 blood pressure 130/89 saturation 98%  Ventilator Settings on assist-control FiO2 is 28% tidal volume 500 PEEP 5  General: Comfortable at this time Neck: supple Cardiovascular: no malignant arrhythmias Respiratory: Scattered coarse rhonchi Skin: no rash seen on limited exam Musculoskeletal: No gross abnormality Psychiatric:unable to assess Neurologic:no involuntary movements         Lab Data:   Basic Metabolic Panel: Recent Labs  Lab 06/08/22 0242 06/08/22 1335 06/11/22 0201  NA 140 141  --   K 3.7 3.8  --   CL 111 110  --   CO2 22 23  --   GLUCOSE 137* 135*  --   BUN 24* 20  --   CREATININE <0.30* <0.30*  --   CALCIUM 9.7 9.7  --   MG 2.0  --  2.1  PHOS 3.3  --  3.7    ABG: No results for input(s): "PHART", "PCO2ART", "PO2ART", "HCO3", "O2SAT" in the last 168 hours.  Liver Function Tests: No results for input(s): "AST", "ALT", "ALKPHOS", "BILITOT", "PROT", "ALBUMIN" in the last 168 hours. No results for input(s): "LIPASE", "AMYLASE" in the last 168 hours. No results for input(s): "AMMONIA" in the last 168 hours.  CBC: Recent Labs  Lab 06/08/22 0242 06/08/22 1335  WBC 6.2 5.1  HGB 9.5* 9.4*  HCT 29.9* 30.8*  MCV 84.7 87.0  PLT 147* 169    Cardiac Enzymes: No results for input(s): "CKTOTAL", "CKMB", "CKMBINDEX", "TROPONINI" in the last 168 hours.  BNP (last 3 results) No results for input(s): "BNP" in  the last 8760 hours.  ProBNP (last 3 results) No results for input(s): "PROBNP" in the last 8760 hours.  Radiological Exams: No results found.  Assessment/Plan Active Problems:   ALS (amyotrophic lateral sclerosis) (HCC)   Cardiac arrest (HCC)   Pseudomonas pneumonia (HCC)   Autonomic dysfunction   Acute on chronic respiratory failure with hypoxia (HCC)   Acute on chronic respiratory failure hypoxia patient continues to be on the ventilator home training underway possible discharge on Tuesday ALS advanced severe end-stage disease Cardiac arrest rhythm has been stable here Pseudomonas pneumonia has been treated with antibiotics improved Autonomic dysfunction is at baseline   I have personally seen and evaluated the patient, evaluated laboratory and imaging results, formulated the assessment and plan and placed orders. The Patient requires high complexity decision making with multiple systems involvement.  Rounds were done with the Respiratory Therapy Director and Staff therapists and discussed with nursing staff also.  Allyne Gee, MD [facility] Pulmonary Critical Care Medicine Sleep Medicine

## 2022-06-13 NOTE — Progress Notes (Signed)
Pulmonary Critical Care Medicine [place]   PULMONARY CRITICAL CARE SERVICE  PROGRESS NOTE     Shawna Kiener  ERD:[id number]  DOB: 16-Mar-1971   DOA: 05/19/2022  Referring Physician: Satira Sark, MD  HPI: Freman Lapage is a 51 y.o. male being followed for ventilator/airway/oxygen weaning Acute on Chronic Respiratory Failure.  Patient is on the ventilator comfortable without distress has been on assist-control mode  Medications: Reviewed on Rounds  Physical Exam:  Vitals: Temperature is 97.3 pulse 72 respiratory 18 blood pressure 114/66 saturation is 99%  Ventilator Settings assist control FiO2 28% tidal volume 500 PEEP of 5  General: Comfortable at this time Neck: supple Cardiovascular: no malignant arrhythmias Respiratory: Scattered rhonchi Skin: no rash seen on limited exam Musculoskeletal: No gross abnormality Psychiatric:unable to assess Neurologic:no involuntary movements         Lab Data:   Basic Metabolic Panel: Recent Labs  Lab 06/08/22 0242 06/08/22 1335 06/11/22 0201  NA 140 141  --   K 3.7 3.8  --   CL 111 110  --   CO2 22 23  --   GLUCOSE 137* 135*  --   BUN 24* 20  --   CREATININE <0.30* <0.30*  --   CALCIUM 9.7 9.7  --   MG 2.0  --  2.1  PHOS 3.3  --  3.7    ABG: No results for input(s): "PHART", "PCO2ART", "PO2ART", "HCO3", "O2SAT" in the last 168 hours.  Liver Function Tests: No results for input(s): "AST", "ALT", "ALKPHOS", "BILITOT", "PROT", "ALBUMIN" in the last 168 hours. No results for input(s): "LIPASE", "AMYLASE" in the last 168 hours. No results for input(s): "AMMONIA" in the last 168 hours.  CBC: Recent Labs  Lab 06/08/22 0242 06/08/22 1335  WBC 6.2 5.1  HGB 9.5* 9.4*  HCT 29.9* 30.8*  MCV 84.7 87.0  PLT 147* 169    Cardiac Enzymes: No results for input(s): "CKTOTAL", "CKMB", "CKMBINDEX", "TROPONINI" in the last 168 hours.  BNP (last 3 results) No results for input(s): "BNP" in the last  8760 hours.  ProBNP (last 3 results) No results for input(s): "PROBNP" in the last 8760 hours.  Radiological Exams: No results found.  Assessment/Plan Active Problems:   ALS (amyotrophic lateral sclerosis) (HCC)   Cardiac arrest (HCC)   Pseudomonas pneumonia (HCC)   Autonomic dysfunction   Acute on chronic respiratory failure with hypoxia (HCC)   Acute on chronic respiratory failure hypoxia patient is on the ventilator full support at baseline ALS supportive care will continue to monitor Cardiac arrest rhythm is stable we will continue with supportive care Autonomic dysfunction has been stable Pseudomonas pneumonia treated resolved   I have personally seen and evaluated the patient, evaluated laboratory and imaging results, formulated the assessment and plan and placed orders. The Patient requires high complexity decision making with multiple systems involvement.  Rounds were done with the Respiratory Therapy Director and Staff therapists and discussed with nursing staff also.  Allyne Gee, MD [facility] Pulmonary Critical Care Medicine Sleep Medicine

## 2022-06-14 ENCOUNTER — Other Ambulatory Visit (HOSPITAL_COMMUNITY): Payer: Self-pay

## 2022-06-14 DIAGNOSIS — I469 Cardiac arrest, cause unspecified: Secondary | ICD-10-CM

## 2022-06-14 DIAGNOSIS — G909 Disorder of the autonomic nervous system, unspecified: Secondary | ICD-10-CM

## 2022-06-14 DIAGNOSIS — G1221 Amyotrophic lateral sclerosis: Secondary | ICD-10-CM

## 2022-06-14 DIAGNOSIS — J9621 Acute and chronic respiratory failure with hypoxia: Secondary | ICD-10-CM

## 2022-06-14 DIAGNOSIS — J151 Pneumonia due to Pseudomonas: Secondary | ICD-10-CM

## 2022-06-14 LAB — CBC
HCT: 32.4 % — ABNORMAL LOW (ref 39.0–52.0)
Hemoglobin: 9.9 g/dL — ABNORMAL LOW (ref 13.0–17.0)
MCH: 26.5 pg (ref 26.0–34.0)
MCHC: 30.6 g/dL (ref 30.0–36.0)
MCV: 86.6 fL (ref 80.0–100.0)
Platelets: 172 10*3/uL (ref 150–400)
RBC: 3.74 MIL/uL — ABNORMAL LOW (ref 4.22–5.81)
RDW: 18 % — ABNORMAL HIGH (ref 11.5–15.5)
WBC: 8.2 10*3/uL (ref 4.0–10.5)
nRBC: 0 % (ref 0.0–0.2)

## 2022-06-14 LAB — BASIC METABOLIC PANEL
Anion gap: 9 (ref 5–15)
BUN: 19 mg/dL (ref 6–20)
CO2: 22 mmol/L (ref 22–32)
Calcium: 9.5 mg/dL (ref 8.9–10.3)
Chloride: 109 mmol/L (ref 98–111)
Creatinine, Ser: <0.3 mg/dL — ABNORMAL LOW (ref 0.61–1.24)
Glucose, Bld: 138 mg/dL — ABNORMAL HIGH (ref 70–99)
Potassium: 3.7 mmol/L (ref 3.5–5.1)
Sodium: 140 mmol/L (ref 135–145)

## 2022-06-14 LAB — PHOSPHORUS: Phosphorus: 3.4 mg/dL (ref 2.5–4.6)

## 2022-06-14 LAB — MAGNESIUM: Magnesium: 1.9 mg/dL (ref 1.7–2.4)

## 2022-06-14 NOTE — Progress Notes (Signed)
Pulmonary Critical Care Medicine [place]   PULMONARY CRITICAL CARE SERVICE  PROGRESS NOTE     Jerry [name]  EHU:[id number]  DOB: 05-28-1971   DOA: 05/19/2022  Referring Physician: Satira Sark, MD  HPI: Jerry [name] is a 51 y.o. male being followed for ventilator/airway/oxygen weaning Acute on Chronic Respiratory Failure. ***  Medications: Reviewed on Rounds  Physical Exam:  Vitals: ***  Ventilator Settings ***  General: Comfortable at this time Neck: supple Cardiovascular: no malignant arrhythmias Respiratory: *** Skin: no rash seen on limited exam Musculoskeletal: No gross abnormality Psychiatric:unable to assess Neurologic:no involuntary movements         Lab Data:   Basic Metabolic Panel: Recent Labs  Lab 06/08/22 0242 06/08/22 1335 06/11/22 0201 06/14/22 0225  NA 140 141  --  140  K 3.7 3.8  --  3.7  CL 111 110  --  109  CO2 22 23  --  22  GLUCOSE 137* 135*  --  138*  BUN 24* 20  --  19  CREATININE <0.30* <0.30*  --  <0.30*  CALCIUM 9.7 9.7  --  9.5  MG 2.0  --  2.1 1.9  PHOS 3.3  --  3.7 3.4    ABG: No results for input(s): "PHART", "PCO2ART", "PO2ART", "HCO3", "O2SAT" in the last 168 hours.  Liver Function Tests: No results for input(s): "AST", "ALT", "ALKPHOS", "BILITOT", "PROT", "ALBUMIN" in the last 168 hours. No results for input(s): "LIPASE", "AMYLASE" in the last 168 hours. No results for input(s): "AMMONIA" in the last 168 hours.  CBC: Recent Labs  Lab 06/08/22 0242 06/08/22 1335 06/14/22 0225  WBC 6.2 5.1 8.2  HGB 9.5* 9.4* 9.9*  HCT 29.9* 30.8* 32.4*  MCV 84.7 87.0 86.6  PLT 147* 169 172    Cardiac Enzymes: No results for input(s): "CKTOTAL", "CKMB", "CKMBINDEX", "TROPONINI" in the last 168 hours.  BNP (last 3 results) No results for input(s): "BNP" in the last 8760 hours.  ProBNP (last 3 results) No results for input(s): "PROBNP" in the last 8760 hours.  Radiological Exams: DG Abd  Portable 1V  Result Date: 06/14/2022 CLINICAL DATA:  Ileus EXAM: PORTABLE ABDOMEN - 1 VIEW COMPARISON:  06/10/2022 abdominal radiograph FINDINGS: Percutaneous gastrostomy tube button overlies the body of the stomach. Generalized mild to moderate small bowel dilatation, mildly worsened. Diffuse moderate gaseous dilatation of the large bowel, mildly worsened. No evidence of pneumatosis or pneumoperitoneum. No radiopaque nephrolithiasis. Mild lumbar spondylosis. IMPRESSION: Generalized mild-to-moderate small bowel dilatation and diffuse moderate gaseous dilatation of the large bowel, all mildly worsened, compatible with worsened adynamic ileus. Electronically Signed   By: Ilona Sorrel M.D.   On: 06/14/2022 07:53    Assessment/Plan Active Problems:   ALS (amyotrophic lateral sclerosis) (HCC)   Cardiac arrest (HCC)   Pseudomonas pneumonia (HCC)   Autonomic dysfunction   Acute on chronic respiratory failure with hypoxia (HCC)   *** *** *** *** ***   I have personally seen and evaluated the patient, evaluated laboratory and imaging results, formulated the assessment and plan and placed orders. The Patient requires high complexity decision making with multiple systems involvement.  Rounds were done with the Respiratory Therapy Director and Staff therapists and discussed with nursing staff also.  Allyne Gee, MD [facility] Pulmonary Critical Care Medicine Sleep Medicine

## 2022-06-15 DIAGNOSIS — R140 Abdominal distension (gaseous): Secondary | ICD-10-CM | POA: Diagnosis not present

## 2022-06-15 DIAGNOSIS — G1221 Amyotrophic lateral sclerosis: Secondary | ICD-10-CM | POA: Diagnosis not present

## 2022-06-15 DIAGNOSIS — K567 Ileus, unspecified: Secondary | ICD-10-CM | POA: Diagnosis not present

## 2022-06-15 DIAGNOSIS — J9621 Acute and chronic respiratory failure with hypoxia: Secondary | ICD-10-CM | POA: Diagnosis not present

## 2022-06-15 LAB — TRIGLYCERIDES: Triglycerides: 55 mg/dL (ref <150)

## 2022-06-16 ENCOUNTER — Telehealth: Payer: Self-pay | Admitting: Family Medicine

## 2022-06-16 ENCOUNTER — Telehealth: Payer: Self-pay

## 2022-06-16 ENCOUNTER — Ambulatory Visit: Payer: Self-pay | Admitting: *Deleted

## 2022-06-16 NOTE — Telephone Encounter (Signed)
  Care Management   Visit Note  06/16/2022 Name: Jerry [name] MRN: [id number] DOB: [birth year]  Subjective: Jerry [name] is a 51 y.o. year old male who is a primary care patient of [name], MD. The Care Management team was consulted for assistance.      Message received from [facility] Case Manager, Timoteo Gaul. Patient was discharged from [facility] on 06/15/22.  Patient requires a post hospital follow up with his Primary Care Provider.    Messaged provider regarding options for visit due to patient requiring stretcher transport. Spouse has contacted insurance provider regarding options for non emergent transportation coverage.   [facility]/ Care Management [phone]

## 2022-06-16 NOTE — Telephone Encounter (Signed)
Message from Sherron Flemings sent at 06/16/2022 11:15 AM EST  Summary: discuss medication   Pts spouse has question regarding pts post hospital prescriptions  Please assist further          Call History   Type Contact Phone/Fax User  06/16/2022 11:15 AM EST Phone (Incoming) [place] (Emergency Contact) [phone] Leroy Kennedy R   Reason for Disposition  [1] Caller has URGENT medicine question about med that PCP or specialist prescribed AND [2] triager unable to answer question    Requesting Neldon Labella, RN from [facility] call her back.  Answer Assessment - Initial Assessment Questions 1. NAME of MEDICINE: "What medicine(s) are you calling about?"     I have questions regarding night time medications and a few for BMs due to an Ileus he has had for several months.   He was discharged from the hospital yesterday. 2. QUESTION: "What is your question?" (e.g., double dose of medicine, side effect)     They gave me a discharge package from the hospital.   I'm not sure if I take it to the pharmacy myself or what.    I can't leave him.   I'm in process of getting a home health nurse so I can't leave him yet.      Since being home he has not gotten any of his medications.  She has a list of medications from the hospital that she has questions about.   She has been working with Neldon Labella, RN with [facility].   Babs Sciara is helping her with getting a home health nurse and other things needed since his release from the hospital.   She has requested Fele[facility] give her a call since she is aware of the situation.    I let pt's wife know I would send a message to Neldon Labella, RN and have her call.    [name], wife was agreeable to this plan.     3. PRESCRIBER: "Who prescribed the medicine?" Reason: if prescribed by specialist, call should be referred to that group.     These are from the hospital 4. SYMPTOMS: "Do you have any symptoms?" If Yes, ask: "What  symptoms are you having?"  "How bad are the symptoms (e.g., mild, moderate, severe)     N/A 5. PREGNANCY:  "Is there any chance that you are pregnant?" "When was your last menstrual period?"     N/A  Protocols used: Medication Question Call-A-AH

## 2022-06-16 NOTE — Telephone Encounter (Signed)
  Chief Complaint: Pt's wife Jerry [name] requesting that Jerry Labella, RN with [facility] that does Chronic Care Management call her.   Jerry [name] has been working with her already with post discharge issues.   Pt. Discharged from hospital yesterday.  Symptoms: Wife has questions regarding his medications and in obtaining a nurse for home health.   Jerry [name] is helping her with this. Frequency: as soon as Jerry [name] can call. Pertinent Negatives: Patient denies N/A Disposition: '[]'$ ED /'[]'$ Urgent Care (no appt availability in office) / '[]'$ Appointment(In office/virtual)/ '[]'$  [place] [facility]/ '[]'$ Home Care/ '[]'$ Refused Recommended Disposition /'[]'$ [place] [facility]/ '[x]'$  Follow-up with PCP Additional Notes: Message sent to [place] high priority for Jerry Labella, RN with Chronic Care Management to call Jerry [name], wife back.    Pt. Agreeable to this plan.

## 2022-06-16 NOTE — Telephone Encounter (Signed)
Verbal orders given  

## 2022-06-16 NOTE — Telephone Encounter (Signed)
Pts spouse is requesting an order for skilled nursing to assist w/ pts post hospital care sent to [place] is requesting a cb to discuss further

## 2022-06-16 NOTE — Telephone Encounter (Signed)
Home Health Verbal Orders - Caller/Agency: Amy with [facility] Number: [phone] Requesting OT/PT/Skilled Nursing/Social Work/Speech Therapy: Vent settings, PICC line, TPN, Foley catheter, Urine sample, Iliostomy Frequency: Specifics to get in as soon as possible  Please contact Amy for Verbal Orders

## 2022-06-17 NOTE — Telephone Encounter (Signed)
Amy from [facility] home health called back with fx # if needed, [phone], should be attn: kelli thorpe or jessica williams.

## 2022-06-17 NOTE — Telephone Encounter (Signed)
Amy called back with [facility] stating they have Hospice care but not Palliative Care. She says [facility] offers Palliative Care and to send orders there for palliative care. Please assist further as she still needs the orders to be sent to [facility] on [address].

## 2022-06-18 ENCOUNTER — Encounter: Payer: Self-pay | Admitting: Family Medicine

## 2022-06-22 ENCOUNTER — Inpatient Hospital Stay: Payer: Medicare HMO | Admitting: Family Medicine

## 2022-06-22 ENCOUNTER — Emergency Department (HOSPITAL_COMMUNITY): Payer: Medicare HMO

## 2022-06-22 ENCOUNTER — Encounter (HOSPITAL_COMMUNITY): Payer: Self-pay | Admitting: Emergency Medicine

## 2022-06-22 ENCOUNTER — Telehealth: Payer: Self-pay | Admitting: Family Medicine

## 2022-06-22 ENCOUNTER — Inpatient Hospital Stay (HOSPITAL_COMMUNITY)
Admission: EM | Admit: 2022-06-22 | Discharge: 2022-07-02 | DRG: 380 | Disposition: A | Discharge status: Home Health | Payer: Medicare HMO | Attending: Family Medicine | Admitting: Family Medicine

## 2022-06-22 DIAGNOSIS — T82594A Other mechanical complication of infusion catheter, initial encounter: Secondary | ICD-10-CM | POA: Diagnosis present

## 2022-06-22 DIAGNOSIS — G1221 Amyotrophic lateral sclerosis: Secondary | ICD-10-CM | POA: Diagnosis present

## 2022-06-22 DIAGNOSIS — R0902 Hypoxemia: Secondary | ICD-10-CM | POA: Diagnosis not present

## 2022-06-22 DIAGNOSIS — Z1152 Encounter for screening for COVID-19: Secondary | ICD-10-CM

## 2022-06-22 DIAGNOSIS — E46 Unspecified protein-calorie malnutrition: Secondary | ICD-10-CM | POA: Diagnosis present

## 2022-06-22 DIAGNOSIS — Z931 Gastrostomy status: Secondary | ICD-10-CM | POA: Diagnosis not present

## 2022-06-22 DIAGNOSIS — Z91013 Allergy to seafood: Secondary | ICD-10-CM | POA: Diagnosis not present

## 2022-06-22 DIAGNOSIS — E876 Hypokalemia: Secondary | ICD-10-CM | POA: Diagnosis present

## 2022-06-22 DIAGNOSIS — Z431 Encounter for attention to gastrostomy: Secondary | ICD-10-CM

## 2022-06-22 DIAGNOSIS — J9612 Chronic respiratory failure with hypercapnia: Secondary | ICD-10-CM | POA: Diagnosis present

## 2022-06-22 DIAGNOSIS — D638 Anemia in other chronic diseases classified elsewhere: Secondary | ICD-10-CM | POA: Diagnosis present

## 2022-06-22 DIAGNOSIS — Z79899 Other long term (current) drug therapy: Secondary | ICD-10-CM

## 2022-06-22 DIAGNOSIS — Z4659 Encounter for fitting and adjustment of other gastrointestinal appliance and device: Secondary | ICD-10-CM | POA: Diagnosis not present

## 2022-06-22 DIAGNOSIS — R509 Fever, unspecified: Secondary | ICD-10-CM | POA: Diagnosis present

## 2022-06-22 DIAGNOSIS — Z9911 Dependence on respirator [ventilator] status: Secondary | ICD-10-CM | POA: Diagnosis not present

## 2022-06-22 DIAGNOSIS — Z8674 Personal history of sudden cardiac arrest: Secondary | ICD-10-CM

## 2022-06-22 DIAGNOSIS — R0602 Shortness of breath: Secondary | ICD-10-CM | POA: Diagnosis not present

## 2022-06-22 DIAGNOSIS — Z515 Encounter for palliative care: Secondary | ICD-10-CM

## 2022-06-22 DIAGNOSIS — Z930 Tracheostomy status: Secondary | ICD-10-CM | POA: Diagnosis not present

## 2022-06-22 DIAGNOSIS — Z833 Family history of diabetes mellitus: Secondary | ICD-10-CM | POA: Diagnosis not present

## 2022-06-22 DIAGNOSIS — J151 Pneumonia due to Pseudomonas: Secondary | ICD-10-CM | POA: Diagnosis present

## 2022-06-22 DIAGNOSIS — E162 Hypoglycemia, unspecified: Secondary | ICD-10-CM | POA: Diagnosis not present

## 2022-06-22 DIAGNOSIS — L89811 Pressure ulcer of head, stage 1: Secondary | ICD-10-CM | POA: Diagnosis present

## 2022-06-22 DIAGNOSIS — Z8661 Personal history of infections of the central nervous system: Secondary | ICD-10-CM

## 2022-06-22 DIAGNOSIS — J9611 Chronic respiratory failure with hypoxia: Secondary | ICD-10-CM | POA: Diagnosis present

## 2022-06-22 DIAGNOSIS — G909 Disorder of the autonomic nervous system, unspecified: Secondary | ICD-10-CM | POA: Diagnosis present

## 2022-06-22 DIAGNOSIS — Z6820 Body mass index (BMI) 20.0-20.9, adult: Secondary | ICD-10-CM

## 2022-06-22 DIAGNOSIS — Z8249 Family history of ischemic heart disease and other diseases of the circulatory system: Secondary | ICD-10-CM | POA: Diagnosis not present

## 2022-06-22 DIAGNOSIS — Z8616 Personal history of COVID-19: Secondary | ICD-10-CM

## 2022-06-22 DIAGNOSIS — R9431 Abnormal electrocardiogram [ECG] [EKG]: Secondary | ICD-10-CM | POA: Diagnosis not present

## 2022-06-22 DIAGNOSIS — Y712 Prosthetic and other implants, materials and accessory cardiovascular devices associated with adverse incidents: Secondary | ICD-10-CM | POA: Diagnosis present

## 2022-06-22 DIAGNOSIS — K567 Ileus, unspecified: Secondary | ICD-10-CM | POA: Diagnosis present

## 2022-06-22 DIAGNOSIS — K315 Obstruction of duodenum: Principal | ICD-10-CM | POA: Diagnosis present

## 2022-06-22 DIAGNOSIS — J9503 Malfunction of tracheostomy stoma: Secondary | ICD-10-CM | POA: Diagnosis not present

## 2022-06-22 DIAGNOSIS — Z7401 Bed confinement status: Secondary | ICD-10-CM | POA: Diagnosis not present

## 2022-06-22 DIAGNOSIS — R140 Abdominal distension (gaseous): Secondary | ICD-10-CM | POA: Diagnosis not present

## 2022-06-22 DIAGNOSIS — K6389 Other specified diseases of intestine: Secondary | ICD-10-CM | POA: Diagnosis not present

## 2022-06-22 DIAGNOSIS — J9621 Acute and chronic respiratory failure with hypoxia: Secondary | ICD-10-CM | POA: Diagnosis not present

## 2022-06-22 DIAGNOSIS — T82898A Other specified complication of vascular prosthetic devices, implants and grafts, initial encounter: Principal | ICD-10-CM

## 2022-06-22 DIAGNOSIS — Z7189 Other specified counseling: Secondary | ICD-10-CM | POA: Diagnosis not present

## 2022-06-22 DIAGNOSIS — Z434 Encounter for attention to other artificial openings of digestive tract: Secondary | ICD-10-CM | POA: Diagnosis not present

## 2022-06-22 HISTORY — PX: IR REPLACE G-TUBE SIMPLE WO FLUORO: IMG2323

## 2022-06-22 LAB — I-STAT CHEM 8, ED
BUN: 21 mg/dL — ABNORMAL HIGH (ref 6–20)
Calcium, Ion: 1.05 mmol/L — ABNORMAL LOW (ref 1.15–1.40)
Chloride: 105 mmol/L (ref 98–111)
Creatinine, Ser: <0.2 mg/dL — ABNORMAL LOW (ref 0.61–1.24)
Glucose, Bld: 82 mg/dL (ref 70–99)
HCT: 34 % — ABNORMAL LOW (ref 39.0–52.0)
Hemoglobin: 11.6 g/dL — ABNORMAL LOW (ref 13.0–17.0)
Potassium: 4.8 mmol/L (ref 3.5–5.1)
Sodium: 138 mmol/L (ref 135–145)
TCO2: 23 mmol/L (ref 22–32)

## 2022-06-22 LAB — CBC WITH DIFFERENTIAL/PLATELET
Abs Immature Granulocytes: 0.04 10*3/uL (ref 0.00–0.07)
Basophils Absolute: 0 10*3/uL (ref 0.0–0.1)
Basophils Relative: 0 %
Eosinophils Absolute: 0.2 10*3/uL (ref 0.0–0.5)
Eosinophils Relative: 2 %
HCT: 34.3 % — ABNORMAL LOW (ref 39.0–52.0)
Hemoglobin: 10.7 g/dL — ABNORMAL LOW (ref 13.0–17.0)
Immature Granulocytes: 1 %
Lymphocytes Relative: 13 %
Lymphs Abs: 1.1 10*3/uL (ref 0.7–4.0)
MCH: 26.4 pg (ref 26.0–34.0)
MCHC: 31.2 g/dL (ref 30.0–36.0)
MCV: 84.5 fL (ref 80.0–100.0)
Monocytes Absolute: 0.5 10*3/uL (ref 0.1–1.0)
Monocytes Relative: 6 %
Neutro Abs: 6.5 10*3/uL (ref 1.7–7.7)
Neutrophils Relative %: 78 %
Platelets: 180 10*3/uL (ref 150–400)
RBC: 4.06 MIL/uL — ABNORMAL LOW (ref 4.22–5.81)
RDW: 18.8 % — ABNORMAL HIGH (ref 11.5–15.5)
WBC: 8.4 10*3/uL (ref 4.0–10.5)
nRBC: 0 % (ref 0.0–0.2)

## 2022-06-22 LAB — URINALYSIS, ROUTINE W REFLEX MICROSCOPIC
Bilirubin Urine: NEGATIVE
Glucose, UA: NEGATIVE mg/dL
Ketones, ur: NEGATIVE mg/dL
Leukocytes,Ua: NEGATIVE
Nitrite: NEGATIVE
Protein, ur: NEGATIVE mg/dL
Specific Gravity, Urine: 1.017 (ref 1.005–1.030)
pH: 7 (ref 5.0–8.0)

## 2022-06-22 LAB — COMPREHENSIVE METABOLIC PANEL
ALT: 66 U/L — ABNORMAL HIGH (ref 0–44)
AST: 39 U/L (ref 15–41)
Albumin: 3.6 g/dL (ref 3.5–5.0)
Alkaline Phosphatase: 133 U/L — ABNORMAL HIGH (ref 38–126)
Anion gap: 13 (ref 5–15)
BUN: 15 mg/dL (ref 6–20)
CO2: 19 mmol/L — ABNORMAL LOW (ref 22–32)
Calcium: 9.9 mg/dL (ref 8.9–10.3)
Chloride: 105 mmol/L (ref 98–111)
Creatinine, Ser: <0.3 mg/dL — ABNORMAL LOW (ref 0.61–1.24)
Glucose, Bld: 82 mg/dL (ref 70–99)
Potassium: 3.4 mmol/L — ABNORMAL LOW (ref 3.5–5.1)
Sodium: 137 mmol/L (ref 135–145)
Total Bilirubin: 0.9 mg/dL (ref 0.3–1.2)
Total Protein: 8.3 g/dL — ABNORMAL HIGH (ref 6.5–8.1)

## 2022-06-22 LAB — RESP PANEL BY RT-PCR (RSV, FLU A&B, COVID)  RVPGX2
Influenza A by PCR: NEGATIVE
Influenza B by PCR: NEGATIVE
Resp Syncytial Virus by PCR: NEGATIVE
SARS Coronavirus 2 by RT PCR: NEGATIVE

## 2022-06-22 LAB — PROTIME-INR
INR: 1.2 (ref 0.8–1.2)
Prothrombin Time: 14.6 seconds (ref 11.4–15.2)

## 2022-06-22 LAB — APTT: aPTT: 35 seconds (ref 24–36)

## 2022-06-22 LAB — EXPECTORATED SPUTUM ASSESSMENT W GRAM STAIN, RFLX TO RESP C

## 2022-06-22 LAB — LACTIC ACID, PLASMA: Lactic Acid, Venous: 0.9 mmol/L (ref 0.5–1.9)

## 2022-06-22 MED ORDER — ACETAMINOPHEN 650 MG RE SUPP: 650.0000 mg | Freq: Four times a day (QID) | RECTAL | Status: DC | PRN

## 2022-06-22 MED ORDER — ACETAMINOPHEN 10 MG/ML IV SOLN
1000.0000 mg | Freq: Once | INTRAVENOUS | Status: AC
  Administered 2022-06-22: 1000 mg via INTRAVENOUS
  Filled 2022-06-22: qty 100

## 2022-06-22 MED ORDER — POTASSIUM CHLORIDE 20 MEQ PO PACK
PACK | ORAL | Status: AC
  Filled 2022-06-22: qty 1

## 2022-06-22 MED ORDER — POLYETHYLENE GLYCOL 3350 17 G PO PACK: 17.0000 g | PACK | Freq: Every day | ORAL | Status: DC | PRN

## 2022-06-22 MED ORDER — ENOXAPARIN SODIUM 40 MG/0.4ML IJ SOSY
40.0000 mg | PREFILLED_SYRINGE | INTRAMUSCULAR | Status: DC
  Administered 2022-06-22 – 2022-07-01 (×10): 40 mg via SUBCUTANEOUS
  Filled 2022-06-22 (×10): qty 0.4

## 2022-06-22 MED ORDER — POTASSIUM CHLORIDE 20 MEQ PO PACK
20.0000 meq | PACK | Freq: Once | ORAL | Status: AC
  Administered 2022-06-22: 20 meq

## 2022-06-22 MED ORDER — DIATRIZOATE MEGLUMINE & SODIUM 66-10 % PO SOLN
ORAL | Status: AC
  Administered 2022-06-22: 15 mL
  Filled 2022-06-22: qty 30

## 2022-06-22 MED ORDER — ONDANSETRON HCL 4 MG/2ML IJ SOLN
4.0000 mg | Freq: Four times a day (QID) | INTRAMUSCULAR | Status: DC | PRN
  Administered 2022-06-23: 4 mg via INTRAVENOUS
  Filled 2022-06-22: qty 2

## 2022-06-22 MED ORDER — ONDANSETRON HCL 4 MG PO TABS: 4.0000 mg | ORAL_TABLET | Freq: Four times a day (QID) | ORAL | Status: DC | PRN

## 2022-06-22 MED ORDER — SODIUM CHLORIDE 0.9% FLUSH
3.0000 mL | Freq: Two times a day (BID) | INTRAVENOUS | Status: DC
  Administered 2022-06-22 – 2022-07-01 (×17): 3 mL via INTRAVENOUS

## 2022-06-22 MED ORDER — SODIUM CHLORIDE 0.9 % IV BOLUS
1000.0000 mL | Freq: Once | INTRAVENOUS | Status: AC
  Administered 2022-06-22: 1000 mL via INTRAVENOUS

## 2022-06-22 MED ORDER — ACETAMINOPHEN 325 MG PO TABS: 650.0000 mg | ORAL_TABLET | Freq: Four times a day (QID) | ORAL | Status: DC | PRN

## 2022-06-22 MED ORDER — LIDOCAINE VISCOUS HCL 2 % MT SOLN
OROMUCOSAL | Status: AC
  Filled 2022-06-22: qty 15

## 2022-06-22 NOTE — Consult Note (Signed)
NAME:  Jerry [name], MRN:  [id number], DOB:  16-Nov-1970, LOS: 0 ADMISSION DATE:  06/22/2022, CONSULTATION DATE:  06/22/22 REFERRING MD:  edp, CHIEF COMPLAINT:  fever   History of Present Illness:  51 yo male with pmh ALSand secondary chronic hypoxic resp failure. He presented to ED today after being home for 7 days with wife (primary care giver) after lengthy stay in [facility]. She was having difficulty detaching the tpn tubing and called home health RN who came and documented a fever and distended abdomen with history of ileus. No other symptoms noted at home and per wife has been stable since discharge from facility.   Upon arrival to ED appears that the peg tube was unusable and ileus was indeed present on imaging. He was documented again to have fever and also complaining of leak around his trach despite getting adequate tidal volume and oxygen sat being 100% on fio2 28% as well as appearing comfortable.   At this time is seems caregiver would prefer observation in hosptial with fever and to ensure resolution of ileus (if possible) as she gives his medication via peg tube. I tend to agree observation for following of pan cx would be prudent in this patient who has an increased risk of decompensation. Continue to discuss palliative/hospice options with pt and family as his long term prognosis is poor esp in setting of inability to have enteral  nutrition now.    Pertinent  Medical History  Als, advanced Chronic hypoxic resp failure Chronic ileus  Significant Hospital Events: Including procedures, antibiotic start and stop dates in addition to other pertinent events   -eval'd in ed for fever after recent d/c from facility Interim History / Subjective:    Objective   Blood pressure 118/73, pulse 83, temperature 98.9 F (37.2 C), temperature source Oral, resp. rate 16, SpO2 98 %.    Vent Mode: AC Set Rate:  [16 bmp] 16 bmp Vt Set:  [500 mL] 500 mL PEEP:  [5 cmH20] 5 cmH20  No intake or  output data in the 24 hours ending 06/22/22 1919 There were no vitals filed for this visit.  Examination: General: awake alert, communicating via computer  HENT: ncat, eomi, perrla, mmmp Lungs: rhonchi bilateraly Cardiovascular: rrr Abdomen: mildly distended, peg tube in place, bs +, passing gas Extremities: no c/c/e ( muscle wasting) Neuro: chronic deficits 2/2 als. Only able to raise eyebrows and attempt to mouth words GU: deferred  Resolved Hospital Problem list     Assessment & Plan:  Fever:  -blood cx -urine cx -sputum if able to obtain -covid/flu etc for completeness sake Chronic trach vent:  -cont home settings Als:  -peg tube in place but pt has been on tpn -will need to eval peg tube -otherwise continue goals of care  -recommend palliative consult considering rapidity in which he re-presented?  Pt stable for admission to trh   Best Practice (right click and "Reselect all SmartList Selections" daily)   Diet/type: NPO DVT prophylaxis: other GI prophylaxis: N/A Lines: yes and it is still needed Foley:  N/A Code Status:  full code Last date of multidisciplinary goals of care discussion [per primary]  Labs   CBC: Recent Labs  Lab 06/22/22 0513 06/22/22 0532  WBC 8.4  --   NEUTROABS 6.5  --   HGB 10.7* 11.6*  HCT 34.3* 34.0*  MCV 84.5  --   PLT 180  --     Basic Metabolic Panel: Recent Labs  Lab 06/22/22 0513 06/22/22  0532  NA 137 138  K 3.4* 4.8  CL 105 105  CO2 19*  --   GLUCOSE 82 82  BUN 15 21*  CREATININE <0.30* <0.20*  CALCIUM 9.9  --    GFR: CrCl cannot be calculated (This lab value cannot be used to calculate CrCl because it is not a number: <0.20). Recent Labs  Lab 06/22/22 0513  WBC 8.4  LATICACIDVEN 0.9    Liver Function Tests: Recent Labs  Lab 06/22/22 0513  AST 39  ALT 66*  ALKPHOS 133*  BILITOT 0.9  PROT 8.3*  ALBUMIN 3.6   No results for input(s): "LIPASE", "AMYLASE" in the last 168 hours. No results for  input(s): "AMMONIA" in the last 168 hours.  ABG    Component Value Date/Time   PHART 7.52 (H) 01/09/2022 1810   PCO2ART 26 (L) 01/09/2022 1810   PO2ART 66 (L) 01/09/2022 1810   HCO3 21.2 01/09/2022 1810   TCO2 23 06/22/2022 0532   ACIDBASEDEF 0.3 01/09/2022 1810   O2SAT 93.4 01/09/2022 1810     Coagulation Profile: Recent Labs  Lab 06/22/22 0513  INR 1.2    Cardiac Enzymes: No results for input(s): "CKTOTAL", "CKMB", "CKMBINDEX", "TROPONINI" in the last 168 hours.  HbA1C: Hgb A1c MFr Bld  Date/Time Value Ref Range Status  12/09/2021 11:05 AM 5.4 4.8 - 5.6 % Final    Comment:    (NOTE) Pre diabetes:          5.7%-6.4%  Diabetes:              >6.4%  Glycemic control for   <7.0% adults with diabetes   10/03/2018 10:37 AM 6.0 (H) <5.7 % of total Hgb Final    Comment:    For someone without known diabetes, a hemoglobin  A1c value between 5.7% and 6.4% is consistent with prediabetes and should be confirmed with a  follow-up test. . For someone with known diabetes, a value <7% indicates that their diabetes is well controlled. A1c targets should be individualized based on duration of diabetes, age, comorbid conditions, and other considerations. . This assay result is consistent with an increased risk of diabetes. . Currently, no consensus exists regarding use of hemoglobin A1c for diagnosis of diabetes for children. .     CBG: No results for input(s): "GLUCAP" in the last 168 hours.  Review of Systems:   As per HPI  Past Medical History:  He,  has a past medical history of ALS (amyotrophic lateral sclerosis) ([place]), COVID-19 virus infection (06/2020), Meningitis (2003), Morbid obesity ([place]), and Prediabetes (11/05/2016).   Surgical History:   Past Surgical History:  Procedure Laterality Date   IR REPLACE G-TUBE SIMPLE WO FLUORO  06/22/2022   MASS EXCISION Right 11/26/2014   Procedure: EXCISION MASS/GROIN EXCISION MASS;  Surgeon: Robert Bellow, MD;   Location: [facility] ORS;  Service: General;  Laterality: Right;   MASS EXCISION Right 11/26/2014   Procedure: MINOR EXCISION OF MASS/POST THIGH MASS;  Surgeon: Robert Bellow, MD;  Location: [facility] ORS;  Service: General;  Laterality: Right;   PEG PLACEMENT  10/27/2021   PORTACATH PLACEMENT Right 06/19/2019     Social History:   reports that he has never smoked. He has never used smokeless tobacco. He reports that he does not drink alcohol and does not use drugs.   Family History:  His family history includes Diabetes in his brother, father, mother, sister, sister, and sister; Fibromyalgia in his sister; Hypertension in his sister.  Allergies Allergies  Allergen Reactions   Crab [Shellfish Allergy] Rash     Home Medications  Prior to Admission medications   Medication Sig Start Date End Date Taking? Authorizing Provider  Baclofen 5 MG TABS Place 5 mg into feeding tube 3 (three) times daily as needed for muscle spasms. 01/08/22   Dessa Phi, DO  bisacodyl (DULCOLAX) 10 MG suppository Place 10 mg rectally daily as needed for severe constipation. 09/18/21   [provider]  enoxaparin (LOVENOX) 40 MG/0.4ML injection Inject 0.4 mLs (40 mg total) into the skin daily. DVT prophylaxis 01/08/22   Dessa Phi, DO  hydrOXYzine (ATARAX) 25 MG tablet Place 1 tablet (25 mg total) into feeding tube 2 (two) times daily as needed for anxiety. 01/08/22   Dessa Phi, DO  hydrOXYzine (ATARAX) 50 MG tablet Place 1 tablet (50 mg total) into feeding tube at bedtime. 01/08/22   Dessa Phi, DO  melatonin 5 MG TABS Place 1 tablet (5 mg total) into feeding tube at bedtime. 01/08/22   Dessa Phi, DO  Nutritional Supplements (FEEDING SUPPLEMENT, KATE FARMS STANDARD 1.4,) LIQD liquid Place 65 mL/hr into feeding tube every 8 (eight) hours. 01/08/22   Dessa Phi, DO  pantoprazole sodium (PROTONIX) 40 mg Place 40 mg into feeding tube daily. 01/08/22   Dessa Phi, DO  polyethylene glycol powder  (GLYCOLAX/MIRALAX) 17 GM/SCOOP powder Place 17 g into feeding tube daily as needed for constipation. 07/30/21   [provider]  scopolamine (TRANSDERM-SCOP) 1 MG/3DAYS Place 1 patch (1.5 mg total) onto the skin every 3 (three) days. 01/11/22   Dessa Phi, DO  Sennosides (SENNA) 8.8 MG/5ML LIQD Take 10 mLs by mouth daily as needed for constipation. 07/30/21   [provider]  tobramycin, PF, (TOBI) 300 MG/5ML nebulizer solution Take 5 mLs (300 mg total) by nebulization 2 (two) times daily. 01/08/22   Dessa Phi, DO     Critical care time: 59

## 2022-06-22 NOTE — Telephone Encounter (Signed)
Kayla sent pt to hospital last night/ his pick line would not detach from TPN tubing and seemed almost glued on / pt also had an increase in secretions and a low grade fever /they thought he should get check out / please advise   Lonn Georgia wanted to give Dr. Ancil Boozer this update

## 2022-06-22 NOTE — H&P (Signed)
History and Physical    Jerry [name] MVE:[id number] DOB: 1971/03/21 DOA: 06/22/2022  PCP: [name], MD   Patient coming from: Home   Chief Complaint: Fever   HPI: Jerry [name] is a 51 y.o. male with medical history significant for ALS with chronic tracheostomy/ventilator-dependent hypoxic respiratory failure, autonomic dysfunction, and ileus on TPN who presents to the emergency department with fever.  Patient was discharged home from [facility] 1 week ago and is being cared for primarily by his wife with home health RN.  His wife was having difficulty detaching TPN tubing from his PICC and called home health RN for assistance.  Home health RN reports a fever of 100.5 F and abdominal distention.  He was sent to the ED for evaluation of this.    ED Course: Upon arrival to the ED, patient is found to be afebrile and saturating well on his usual vent settings with systolic blood pressure of 91 and greater.  EKG demonstrates sinus rhythm, chest x-ray notable for some chronic findings but no acute cardiopulmonary disease, and KUB with no significant change in ileus or G-tube placement.  Labs notable for potassium 3.4, bicarbonate 19, alkaline phosphatase 133, AST 66, hemoglobin 10.7, lactic acid 0.9, and negative COVID/influenza/RSV PCR.  PCCM was consulted by the ED physician, evaluated the patient in the emergency department, and recommends observation on the hospitalist service given patient's increased risk of decompensation.   Review of Systems:  All other systems reviewed and apart from HPI, are negative.  Past Medical History:  Diagnosis Date   ALS (amyotrophic lateral sclerosis) ([place])    COVID-19 virus infection 06/2020   Meningitis 2003   spinal   Morbid obesity ([place])    Prediabetes 11/05/2016   A1C 5.7 on 11/05/16    Past Surgical History:  Procedure Laterality Date   IR REPLACE G-TUBE SIMPLE WO FLUORO  06/22/2022   MASS EXCISION Right 11/26/2014   Procedure: EXCISION MASS/GROIN  EXCISION MASS;  Surgeon: Robert Bellow, MD;  Location: [facility] ORS;  Service: General;  Laterality: Right;   MASS EXCISION Right 11/26/2014   Procedure: MINOR EXCISION OF MASS/POST THIGH MASS;  Surgeon: Robert Bellow, MD;  Location: [facility] ORS;  Service: General;  Laterality: Right;   PEG PLACEMENT  10/27/2021   PORTACATH PLACEMENT Right 06/19/2019    Social History:   reports that he has never smoked. He has never used smokeless tobacco. He reports that he does not drink alcohol and does not use drugs.  Allergies  Allergen Reactions   Crab [Shellfish Allergy] Rash    Family History  Problem Relation Age of Onset   Diabetes Father    Diabetes Mother    Diabetes Sister    Hypertension Sister    Diabetes Brother    Diabetes Sister    Fibromyalgia Sister    Diabetes Sister      Prior to Admission medications   Medication Sig Start Date End Date Taking? Authorizing Provider  Baclofen 5 MG TABS Place 5 mg into feeding tube 3 (three) times daily as needed for muscle spasms. 01/08/22   Dessa Phi, DO  bisacodyl (DULCOLAX) 10 MG suppository Place 10 mg rectally daily as needed for severe constipation. 09/18/21   [provider]  enoxaparin (LOVENOX) 40 MG/0.4ML injection Inject 0.4 mLs (40 mg total) into the skin daily. DVT prophylaxis 01/08/22   Dessa Phi, DO  hydrOXYzine (ATARAX) 25 MG tablet Place 1 tablet (25 mg total) into feeding tube 2 (two) times daily as needed  for anxiety. 01/08/22   Dessa Phi, DO  hydrOXYzine (ATARAX) 50 MG tablet Place 1 tablet (50 mg total) into feeding tube at bedtime. 01/08/22   Dessa Phi, DO  melatonin 5 MG TABS Place 1 tablet (5 mg total) into feeding tube at bedtime. 01/08/22   Dessa Phi, DO  Nutritional Supplements (FEEDING SUPPLEMENT, KATE FARMS STANDARD 1.4,) LIQD liquid Place 65 mL/hr into feeding tube every 8 (eight) hours. 01/08/22   Dessa Phi, DO  pantoprazole sodium (PROTONIX) 40 mg Place 40 mg into feeding tube  daily. 01/08/22   Dessa Phi, DO  polyethylene glycol powder (GLYCOLAX/MIRALAX) 17 GM/SCOOP powder Place 17 g into feeding tube daily as needed for constipation. 07/30/21   [provider]  scopolamine (TRANSDERM-SCOP) 1 MG/3DAYS Place 1 patch (1.5 mg total) onto the skin every 3 (three) days. 01/11/22   Dessa Phi, DO  Sennosides (SENNA) 8.8 MG/5ML LIQD Take 10 mLs by mouth daily as needed for constipation. 07/30/21   [provider]  tobramycin, PF, (TOBI) 300 MG/5ML nebulizer solution Take 5 mLs (300 mg total) by nebulization 2 (two) times daily. 01/08/22   Dessa Phi, DO    Physical Exam: Vitals:   06/22/22 2030 06/22/22 2100 06/22/22 2125 06/22/22 2130  BP: 120/81 111/77  119/80  Pulse: 93 84 74 79  Resp: '19 10 15 '$ (!) 24  Temp:      TempSrc:      SpO2: 98% 98% 99% 100%    Constitutional: NAD, calm  Eyes: PERTLA, lids and conjunctivae normal ENMT: Mucous membranes are moist. Posterior pharynx clear of any exudate or lesions.   Neck: supple, no masses  Respiratory: no wheezing, no crackles. No accessory muscle use.  Cardiovascular: S1 & S2 heard, regular rate and rhythm. No extremity edema.  Abdomen: soft, no rebound pain or guarding. Bowel sounds active.  Musculoskeletal: no clubbing / cyanosis. No joint deformity upper and lower extremities.   Skin: no significant rashes, lesions, ulcers. Warm, dry, well-perfused. Neurologic: Alert and oriented. Severe global weakness.  Psychiatric: Pleasant. Cooperative.    Labs and Imaging on Admission: I have personally reviewed following labs and imaging studies  CBC: Recent Labs  Lab 06/22/22 0513 06/22/22 0532  WBC 8.4  --   NEUTROABS 6.5  --   HGB 10.7* 11.6*  HCT 34.3* 34.0*  MCV 84.5  --   PLT 180  --    Basic Metabolic Panel: Recent Labs  Lab 06/22/22 0513 06/22/22 0532  NA 137 138  K 3.4* 4.8  CL 105 105  CO2 19*  --   GLUCOSE 82 82  BUN 15 21*  CREATININE <0.30* <0.20*  CALCIUM 9.9  --     GFR: CrCl cannot be calculated (This lab value cannot be used to calculate CrCl because it is not a number: <0.20). Liver Function Tests: Recent Labs  Lab 06/22/22 0513  AST 39  ALT 66*  ALKPHOS 133*  BILITOT 0.9  PROT 8.3*  ALBUMIN 3.6   No results for input(s): "LIPASE", "AMYLASE" in the last 168 hours. No results for input(s): "AMMONIA" in the last 168 hours. Coagulation Profile: Recent Labs  Lab 06/22/22 0513  INR 1.2   Cardiac Enzymes: No results for input(s): "CKTOTAL", "CKMB", "CKMBINDEX", "TROPONINI" in the last 168 hours. BNP (last 3 results) No results for input(s): "PROBNP" in the last 8760 hours. HbA1C: No results for input(s): "HGBA1C" in the last 72 hours. CBG: No results for input(s): "GLUCAP" in the last 168 hours. Lipid Profile: No  results for input(s): "CHOL", "HDL", "LDLCALC", "TRIG", "CHOLHDL", "LDLDIRECT" in the last 72 hours. Thyroid Function Tests: No results for input(s): "TSH", "T4TOTAL", "FREET4", "T3FREE", "THYROIDAB" in the last 72 hours. Anemia Panel: No results for input(s): "VITAMINB12", "FOLATE", "FERRITIN", "TIBC", "IRON", "RETICCTPCT" in the last 72 hours. Urine analysis:    Component Value Date/Time   COLORURINE AMBER (A) 06/22/2022 0536   APPEARANCEUR CLOUDY (A) 06/22/2022 0536   LABSPEC 1.017 06/22/2022 0536   PHURINE 7.0 06/22/2022 0536   GLUCOSEU NEGATIVE 06/22/2022 0536   HGBUR SMALL (A) 06/22/2022 0536   BILIRUBINUR NEGATIVE 06/22/2022 0536   KETONESUR NEGATIVE 06/22/2022 0536   PROTEINUR NEGATIVE 06/22/2022 0536   NITRITE NEGATIVE 06/22/2022 0536   LEUKOCYTESUR NEGATIVE 06/22/2022 0536   Sepsis Labs: '@LABRCNTIP'$ (procalcitonin:4,lacticidven:4) ) Recent Results (from the past 240 hour(s))  Resp panel by RT-PCR (RSV, Flu A&B, Covid) Anterior Nasal Swab     Status: None   Collection Time: 06/22/22  5:39 AM   Specimen: Anterior Nasal Swab  Result Value Ref Range Status   SARS Coronavirus 2 by RT PCR NEGATIVE NEGATIVE  Final    Comment: (NOTE) SARS-CoV-2 target nucleic acids are NOT DETECTED.  The SARS-CoV-2 RNA is generally detectable in upper respiratory specimens during the acute phase of infection. The lowest concentration of SARS-CoV-2 viral copies this assay can detect is 138 copies/mL. A negative result does not preclude SARS-Cov-2 infection and should not be used as the sole basis for treatment or other patient management decisions. A negative result may occur with  improper specimen collection/handling, submission of specimen other than nasopharyngeal swab, presence of viral mutation(s) within the areas targeted by this assay, and inadequate number of viral copies(<138 copies/mL). A negative result must be combined with clinical observations, patient history, and epidemiological information. The expected result is Negative.  Fact Sheet for Patients:  EntrepreneurPulse.com.au  Fact Sheet for Healthcare Providers:  IncredibleEmployment.be  This test is no t yet approved or cleared by the [place] FDA and  has been authorized for detection and/or diagnosis of SARS-CoV-2 by FDA under an Emergency Use Authorization (EUA). This EUA will remain  in effect (meaning this test can be used) for the duration of the COVID-19 declaration under Section 564(b)(1) of the Act, 21 U.S.C.section 360bbb-3(b)(1), unless the authorization is terminated  or revoked sooner.       Influenza A by PCR NEGATIVE NEGATIVE Final   Influenza B by PCR NEGATIVE NEGATIVE Final    Comment: (NOTE) The Xpert Xpress SARS-CoV-2/FLU/RSV plus assay is intended as an aid in the diagnosis of influenza from Nasopharyngeal swab specimens and should not be used as a sole basis for treatment. Nasal washings and aspirates are unacceptable for Xpert Xpress SARS-CoV-2/FLU/RSV testing.  Fact Sheet for Patients: EntrepreneurPulse.com.au  Fact Sheet for Healthcare  Providers: IncredibleEmployment.be  This test is not yet approved or cleared by the [place] FDA and has been authorized for detection and/or diagnosis of SARS-CoV-2 by FDA under an Emergency Use Authorization (EUA). This EUA will remain in effect (meaning this test can be used) for the duration of the COVID-19 declaration under Section 564(b)(1) of the Act, 21 U.S.C. section 360bbb-3(b)(1), unless the authorization is terminated or revoked.     Resp Syncytial Virus by PCR NEGATIVE NEGATIVE Final    Comment: (NOTE) Fact Sheet for Patients: EntrepreneurPulse.com.au  Fact Sheet for Healthcare Providers: IncredibleEmployment.be  This test is not yet approved or cleared by the [place] FDA and has been authorized for detection and/or diagnosis of SARS-CoV-2  by FDA under an Emergency Use Authorization (EUA). This EUA will remain in effect (meaning this test can be used) for the duration of the COVID-19 declaration under Section 564(b)(1) of the Act, 21 U.S.C. section 360bbb-3(b)(1), unless the authorization is terminated or revoked.  Performed at [facility], [place] [address] [place], [place] [address]   Expectorated Sputum Assessment w Gram Stain, Rflx to Resp Cult     Status: None   Collection Time: 06/22/22  6:05 AM   Specimen: Expectorated Sputum  Result Value Ref Range Status   Specimen Description EXPECTORATED SPUTUM  Final   Special Requests NONE  Final   Sputum evaluation   Final    THIS SPECIMEN IS ACCEPTABLE FOR SPUTUM CULTURE Performed at [facility], [place] [address] [place], [place] [address]    Report Status 06/22/2022 FINAL  Final     Radiological Exams on Admission: IR REPLACE G-TUBE SIMPLE WO FLUORO  Result Date: 06/22/2022 INDICATION: Patient with G-tube, exchange requested by ED, concern for bacterial growth in tubing. Tube placed at OSH.  No record within system. EXAM: BEDSIDE  REPLACEMENT OF GASTROSTOMY TUBE COMPARISON:  KUB, concurrent.  CT AP, 03/28/2022. COMPLICATIONS: None immediate. PROCEDURE: The upper abdomen and external portion of the existing gastrostomy tube was examined. The tube was intact and proceded with replacement. Viscous lidocaine was instilled in the tract about the existing gastrostomy tube. The indwelling pull-through tube was removed without incident. A new 24 Fr balloon retention g-tube was easily placed into the tract. The balloon was inflated with 10 mL NS and bumper disc was cinched. Tube gave a whoosh of air upon placement and gastric contents were seen in the tube. A dressing was placed. The patient tolerated the procedure well without immediate postprocedural complication. A KUB with contrast was ordered and demonstrates tube in proper position. IMPRESSION: Successful bedside replacement of a new 24 Fr Enfit hub-type gastrostomy tube. The new gastrostomy tube is ready for immediate use. Read by: Alexandria Lodge, PA-C Electronically Signed   By: Michaelle Birks M.D.   On: 06/22/2022 16:35   DG ABDOMEN PEG TUBE LOCATION  Result Date: 06/22/2022 CLINICAL DATA:  Malfunctioning gastrostomy tube. Tube placed at OSH, no record within system. EXAM: ABDOMEN - 1 VIEW COMPARISON:  KUB, 06/22/2022.  CT AP, 03/28/2022. FINDINGS: Support lines: Well-positioned gastrostomy tube with air and intraluminal contrast opacifying the stomach. Similar gaseous distention of small bowel and colon. No interval osseous abnormality. IMPRESSION: Well-positioned gastrostomy tube, with intraluminal contrast opacification of stomach. Electronically Signed   By: Michaelle Birks M.D.   On: 06/22/2022 16:33   DG Abdomen 1 View  Result Date: 06/22/2022 CLINICAL DATA:  51 year old male with possible sepsis. Fever. Ileus. EXAM: ABDOMEN - 1 VIEW COMPARISON:  Abdominal radiographs 06/14/2022 and earlier. FINDINGS: Portable AP supine view at 0510 hours. Gastrostomy tube remains in place.  Diffuse large and small bowel gas throughout the abdomen and pelvis again noted, not significantly changed over this series of exams, including at a similar appearance of the abdomen in June this year (12/12/2021. Stable visualized osseous structures. IMPRESSION: Chronic ileus gas pattern and gastrostomy tube not significantly changed. Electronically Signed   By: Genevie Ann M.D.   On: 06/22/2022 05:26   DG Chest Port 1 View  Result Date: 06/22/2022 CLINICAL DATA:  51 year old male with possible sepsis.  Fever. EXAM: PORTABLE CHEST 1 VIEW COMPARISON:  Portable chest 05/07/2022 and earlier. FINDINGS: Portable AP upright view at 0507 hours. Stable right  chest dual lumen power port, PICC line, and tracheostomy. Elevated left hemidiaphragm and patchy, confluent left perihilar and lung base opacity has not significantly changed from last month. Contralateral right lung remains clear when allowing for portable technique. No background pulmonary edema. No pneumothorax. No cardiomegaly. Stable visible bowel gas pattern, gastrostomy tube. IMPRESSION: 1. Stable lines and tubes. 2. Chronic elevated left hemidiaphragm, left perihilar and lung base opacity which is not significantly changed from October and doubtful for acute infection. 3. No new cardiopulmonary abnormality. Electronically Signed   By: Genevie Ann M.D.   On: 06/22/2022 05:19    EKG: Independently reviewed. Sinus rhythm.   Assessment/Plan   1. Fever  - Presents with fever 100.5 F at home  - Afebrile here with normal lactate, no SIRS criteria or evidence for acute infection on ED workup, COVID/Flu/RSV pcr negative   - Sputum culture collected in ED, will also culture blood and urine, follow clinical course    2. Chronic hypoxic respiratory failure  - Stable on home vent settings, appreciate PCCM involvement     3. ALS  - Continue trach and PEG care, consult palliative care   4. Ileus  - Appears stable on CT  - PEG replaced by IR 06/22/22  -  Continue supportive care    5. Autonomic dysfunction  - Normal HR and BP in ED, continue to monitor    6. Hypokalemia  - Replacing    DVT prophylaxis: Lovenox  Code Status: Full  Level of Care: Level of care: Progressive Family Communication: wife at bedside  Disposition Plan:  Patient is from: Home   Anticipated d/c is to: TBD Anticipated d/c date is: Possibly as early as 06/23/22  Patient currently: Pending cultures, stability  Consults called: PCCM  Admission status: Observation     Vianne Bulls, MD [facility]  06/22/2022, 9:32 PM

## 2022-06-22 NOTE — ED Provider Notes (Signed)
Patient's wife is having significant concerns about discharge home.  She is concerned about increased secretions and low-grade fevers documented earlier today.  Patient is obviously at high risk for aspiration event.  Feeding tube exchanged without difficulty.  Critical care made aware of case and will evaluate.   Valarie Merino, MD 06/22/22 1910

## 2022-06-22 NOTE — Progress Notes (Signed)
Spoke with bed placement. Only progressive floor that takes stable vent patients (4N) is full, so patient would have to be housed in ICU. Family medicine teaching service is unable to be primary on patients that are in the ICU.   Spoke with CCM Dr. Ruthann Cancer, she reports that [facility] could be primary team while he is physically in the ICU.   Called back ED Dr. Francia Greaves, who will page [facility] for admission.   Ezequiel Essex, MD

## 2022-06-22 NOTE — Progress Notes (Deleted)
Name: Jerry [name]   MRN: [id number]    DOB: 08-Oct-1970   Date:06/22/2022       Progress Note  No chief complaint on file.    Subjective:   Jerry [name] is a 51 y.o. male, presents to clinic for hospital f/up Pt of Dr. Ancil Boozer  Here for hospital follow up/transition of care.  Admit date: 05/19/2022 - [facility]/2022-06/15/2022 27 d, reviewed notes per Dr. Humphrey Rolls, some HH/palliative care orders have been arranged with phone calls to pt's PCP.  Transition of care calls and management done by [facility] 12/12 and 06/16/2022  Admitted for ALS, cardiac arrest, pseudomonas pneumonia, autonomic dysfunction and acute on chronic respiratory failure, during LTAC admission trach was placed  Last pulm note A&P: Assessment/Plan Active Problems:   ALS (amyotrophic lateral sclerosis) (HCC)   Cardiac arrest ([place])   Pseudomonas pneumonia (HCC)   Autonomic dysfunction   Acute on chronic respiratory failure with hypoxia ([place])     Acute on chronic respiratory failure hypoxia patient is doing well on assist-control mode continue with full support possible discharge in the morning Cardiac arrest rhythm is stable we will continue to monitor Pseudomonas pneumonia has been treated with antibiotics Autonomic dysfunction no change ALS advanced severe disease   Per chart - pt was sent to ED overnight  pt to hospital last night/ his pick line would not detach from TPN tubing and seemed almost glued on / pt also had an increase in secretions and a low grade fever /they thought he should get check out   ED note - occlusion of PICC line, ileus, PEG adjustment/replacement/removal  - last documented info per ED was this am: Medical Decision Making Amount and/or Complexity of Data Reviewed Labs: ordered. Decision-making details documented in ED Course. Radiology: ordered and independent interpretation performed. Decision-making details documented in ED  Course. ECG/medicine tests: ordered and independent interpretation performed. Decision-making details documented in ED Course.   Risk Decision regarding hospitalization.     ALS patient, trach, PEG, PICC line dependent. Here with complications for PICC line. PICC team consulted who are able to flush the ports.  PICC line is nonfunctional.   Also reported to have low-grade fever with increased secretions. Will obtain labs and assess for infectious source including pneumonia, viral infection. CBC without leukocytosis or anemia. Metabolic panel without significant electrolyte derangements or renal insufficiency. UA with bacteria but no nitrites or leukocytes concerning for infection Lactic acid normal Viral panel negative for COVID, influenza or RSV Sputum cultures sent and pending Chest x-ray without evidence of pneumonia.   No antibiotics needed at this time. Will await cultures.   On exam distended abdomen with a PEG tube appeared to be dirty with black sedimentous material. He no longer get feeds, but is getting medication through PEG.  KUB notable for stable ileus.   Spoke with Dr. Earleen Newport from IR to see if they were able to get the patient in today for PEG tube exchange.  If not to assist with scheduling for outpatient replacement.  Dr. Earleen Newport will look over the schedule and get back to Jerry [name].   Patient care turned over to oncoming provider. Patient case and results discussed in detail; please see their note for further ED managment.      Final Clinical Impression(s) / ED Diagnoses Final diagnoses:  Occlusion of peripherally inserted central catheter (PICC) line, initial encounter ([place])  Ileus ([place])  PEG (percutaneous endoscopic gastrostomy) adjustment/replacement/removal ([place])  This chart was dictated using voice recognition software.  Despite best efforts to proofread,  errors can occur which can change the documentation meaning.     Fatima Blank, MD 06/22/22 [phone]          Electronically signed by Fatima Blank, MD at 06/22/2022  7:49 AM   Current Outpatient Medications:    Baclofen 5 MG TABS, Place 5 mg into feeding tube 3 (three) times daily as needed for muscle spasms., Disp: , Rfl:    bisacodyl (DULCOLAX) 10 MG suppository, Place 10 mg rectally daily as needed for severe constipation., Disp: , Rfl:    enoxaparin (LOVENOX) 40 MG/0.4ML injection, Inject 0.4 mLs (40 mg total) into the skin daily. DVT prophylaxis, Disp: 0 mL, Rfl:    hydrOXYzine (ATARAX) 25 MG tablet, Place 1 tablet (25 mg total) into feeding tube 2 (two) times daily as needed for anxiety., Disp: 30 tablet, Rfl: 0   hydrOXYzine (ATARAX) 50 MG tablet, Place 1 tablet (50 mg total) into feeding tube at bedtime., Disp: 30 tablet, Rfl: 0   melatonin 5 MG TABS, Place 1 tablet (5 mg total) into feeding tube at bedtime., Disp: , Rfl: 0   Nutritional Supplements (FEEDING SUPPLEMENT, KATE FARMS STANDARD 1.4,) LIQD liquid, Place 65 mL/hr into feeding tube every 8 (eight) hours., Disp: , Rfl:    pantoprazole sodium (PROTONIX) 40 mg, Place 40 mg into feeding tube daily., Disp: , Rfl:    polyethylene glycol powder (GLYCOLAX/MIRALAX) 17 GM/SCOOP powder, Place 17 g into feeding tube daily as needed for constipation., Disp: , Rfl:    scopolamine (TRANSDERM-SCOP) 1 MG/3DAYS, Place 1 patch (1.5 mg total) onto the skin every 3 (three) days., Disp: 10 patch, Rfl: 0   Sennosides (SENNA) 8.8 MG/5ML LIQD, Take 10 mLs by mouth daily as needed for constipation., Disp: , Rfl:    tobramycin, PF, (TOBI) 300 MG/5ML nebulizer solution, Take 5 mLs (300 mg total) by nebulization 2 (two) times daily., Disp: 280 mL, Rfl: 0  Patient Active Problem List   Diagnosis Date Noted   Ileus ([place]) 05/19/2022   Abdominal bloating 05/19/2022   Acute on chronic respiratory failure with hypoxia (HCC) 01/11/2022   Pseudomonas pneumonia ([place]) 01/06/2022   Autonomic dysfunction 01/06/2022    Ventilator dependent ([place]) 01/06/2022   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/06/2022   Pressure injury of skin 12/27/2021   Cardiac arrest ([place])    Acute hypoxemic respiratory failure ([place])    Septic shock ([place]) 12/09/2021   Anxiety 11/18/2021   Bed sore on buttock, right, unstageable ([place]) 11/18/2021   Severe protein-calorie malnutrition ([place]) 11/13/2021   Microscopic hematuria 10/29/2021   S/P percutaneous endoscopic gastrostomy (PEG) tube placement ([place]) 10/29/2021   ALS (amyotrophic lateral sclerosis) ([place]) 01/11/2019    Past Surgical History:  Procedure Laterality Date   MASS EXCISION Right 11/26/2014   Procedure: EXCISION MASS/GROIN EXCISION MASS;  Surgeon: Robert Bellow, MD;  Location: [facility] ORS;  Service: General;  Laterality: Right;   MASS EXCISION Right 11/26/2014   Procedure: MINOR EXCISION OF MASS/POST THIGH MASS;  Surgeon: Robert Bellow, MD;  Location: [facility] ORS;  Service: General;  Laterality: Right;   PEG PLACEMENT  10/27/2021   PORTACATH PLACEMENT Right 06/19/2019    Family History  Problem Relation Age of Onset   Diabetes Father    Diabetes Mother    Diabetes Sister    Hypertension Sister    Diabetes Brother    Diabetes Sister    Fibromyalgia Sister  Diabetes Sister     Social History   Tobacco Use   Smoking status: Never   Smokeless tobacco: Never  Vaping Use   Vaping Use: Never used  Substance Use Topics   Alcohol use: No    Alcohol/week: 0.0 standard drinks of alcohol   Drug use: No     Allergies  Allergen Reactions   Crab [Shellfish Allergy] Rash    Health Maintenance  Topic Date Due   Zoster Vaccines- Shingrix (1 of 2) Never done   COVID-19 Vaccine (3 - Pfizer risk series) 11/22/2019   Medicare Annual Wellness (AWV)  09/04/2021   INFLUENZA VACCINE  02/02/2022   COLONOSCOPY (Pts 45-6yr Insurance coverage will need to be confirmed)  11/19/2022 (Originally 12/24/2016)   DTaP/Tdap/Td (2 - Tdap) 11/03/2024   Hepatitis C Screening   Completed   HIV Screening  Completed   HPV VACCINES  Aged Out    Chart Review Today: ***  Review of Systems   Objective:   There were no vitals filed for this visit.  There is no height or weight on file to calculate BMI.  Physical Exam      Assessment & Plan:   Problem List Items Addressed This Visit   None    No follow-ups on file.   LDelsa Grana PA-C 06/22/22 2:41 PM

## 2022-06-22 NOTE — ED Triage Notes (Signed)
Pt coming from home. Vent/trach patient with ALS. Family called due to getting TPN. Chemo going through PICC line. Luer lock is stuck on  Pt went into Cardiac arrest in June then went to [facility]. Was discharged Tuesday home with family. Family report they feel comfortable taking care of him.  They called home health nurse to see if they could help with getting TPN off luer lock. Nurse came out to assist and was not able to get it changed out either. Also discovered pt had low grade fever, excess secretions starting today. She reports since his discharge he did not have any meds until Friday.

## 2022-06-22 NOTE — ED Provider Notes (Signed)
[facility] EMERGENCY DEPARTMENT Provider Note  CSN: [id number] Arrival date & time: 06/22/22 0416  Chief Complaint(s) No chief complaint on file.  HPI Jerry [name] is a 51 y.o. male with extensive past medical history listed below including ALS and recent cardiac arrest requiring trach, PEG tube and PICC line for TPN presents to the emergency department with complication of his PICC line, increased oral secretions and low-grade fever.  Patient was discharged from long-term facility 1 week ago.  Wife is the primary caregiver and also has home health assisting.  Today nursing reported difficulty flushing to ports and unable to detach connection to one of the ports and the PICC line.  Given the increased accretions a temperature was obtained and noted to be 100.5.  Suctioning is at baseline.  Ventilator settings are at baseline.  Patient has no physical complaints.  HPI  Past Medical History Past Medical History:  Diagnosis Date   ALS (amyotrophic lateral sclerosis) ([place])    COVID-19 virus infection 06/2020   Meningitis 2003   spinal   Morbid obesity ([place])    Prediabetes 11/05/2016   A1C 5.7 on 11/05/16   Patient Active Problem List   Diagnosis Date Noted   Ileus ([place]) 05/19/2022   Abdominal bloating 05/19/2022   Acute on chronic respiratory failure with hypoxia ([place]) 01/11/2022   Pseudomonas pneumonia ([place]) 01/06/2022   Autonomic dysfunction 01/06/2022   Ventilator dependent ([place]) 01/06/2022   Tracheostomy dependent ([place]) 01/06/2022   Pressure injury of skin 12/27/2021   Cardiac arrest ([place])    Acute hypoxemic respiratory failure ([place])    Septic shock ([place]) 12/09/2021   Anxiety 11/18/2021   Bed sore on buttock, right, unstageable ([place]) 11/18/2021   Severe protein-calorie malnutrition ([place]) 11/13/2021   Microscopic hematuria 10/29/2021   S/P percutaneous endoscopic gastrostomy (PEG) tube placement ([place]) 10/29/2021   ALS (amyotrophic lateral sclerosis) ([place])  01/11/2019   Home Medication(s) Prior to Admission medications   Medication Sig Start Date End Date Taking? Authorizing Provider  Baclofen 5 MG TABS Place 5 mg into feeding tube 3 (three) times daily as needed for muscle spasms. 01/08/22   Dessa Phi, DO  bisacodyl (DULCOLAX) 10 MG suppository Place 10 mg rectally daily as needed for severe constipation. 09/18/21   [provider]  enoxaparin (LOVENOX) 40 MG/0.4ML injection Inject 0.4 mLs (40 mg total) into the skin daily. DVT prophylaxis 01/08/22   Dessa Phi, DO  hydrOXYzine (ATARAX) 25 MG tablet Place 1 tablet (25 mg total) into feeding tube 2 (two) times daily as needed for anxiety. 01/08/22   Dessa Phi, DO  hydrOXYzine (ATARAX) 50 MG tablet Place 1 tablet (50 mg total) into feeding tube at bedtime. 01/08/22   Dessa Phi, DO  melatonin 5 MG TABS Place 1 tablet (5 mg total) into feeding tube at bedtime. 01/08/22   Dessa Phi, DO  Nutritional Supplements (FEEDING SUPPLEMENT, KATE FARMS STANDARD 1.4,) LIQD liquid Place 65 mL/hr into feeding tube every 8 (eight) hours. 01/08/22   Dessa Phi, DO  pantoprazole sodium (PROTONIX) 40 mg Place 40 mg into feeding tube daily. 01/08/22   Dessa Phi, DO  polyethylene glycol powder (GLYCOLAX/MIRALAX) 17 GM/SCOOP powder Place 17 g into feeding tube daily as needed for constipation. 07/30/21   [provider]  scopolamine (TRANSDERM-SCOP) 1 MG/3DAYS Place 1 patch (1.5 mg total) onto the skin every 3 (three) days. 01/11/22   Dessa Phi, DO  Sennosides (SENNA) 8.8 MG/5ML LIQD Take 10 mLs by mouth daily as needed for  constipation. 07/30/21   [provider]  tobramycin, PF, (TOBI) 300 MG/5ML nebulizer solution Take 5 mLs (300 mg total) by nebulization 2 (two) times daily. 01/08/22   Dessa Phi, DO                                                                                                                                    Allergies Otho Darner allergy]  Review  of Systems Review of Systems As noted in HPI  Physical Exam Vital Signs  I have reviewed the triage vital signs BP 123/84   Pulse 92   Temp 100.2 F (37.9 C) (Oral)   Resp 20   SpO2 100%   Physical Exam Vitals reviewed.  Constitutional:      General: He is not in acute distress.    Appearance: He is well-developed. He is not diaphoretic.  HENT:     Head: Normocephalic and atraumatic.     Nose: Nose normal.  Eyes:     General: No scleral icterus.       Right eye: No discharge.        Left eye: No discharge.     Conjunctiva/sclera: Conjunctivae normal.     Pupils: Pupils are equal, round, and reactive to light.  Neck:   Cardiovascular:     Rate and Rhythm: Normal rate and regular rhythm.     Heart sounds: No murmur heard.    No friction rub. No gallop.  Pulmonary:     Effort: Pulmonary effort is normal. No respiratory distress.     Breath sounds: Transmitted upper airway sounds present. No stridor. No wheezing, rhonchi or rales.  Abdominal:     General: There is distension.     Palpations: Abdomen is soft.     Tenderness: There is no abdominal tenderness.    Genitourinary:    Comments: Indwelling foley Musculoskeletal:        General: No tenderness.       Arms:     Cervical back: Normal range of motion and neck supple.  Skin:    General: Skin is warm and dry.     Findings: No erythema or rash.  Neurological:     Mental Status: He is alert and oriented to person, place, and time.     ED Results and Treatments Labs (all labs ordered are listed, but only abnormal results are displayed) Labs Reviewed  COMPREHENSIVE METABOLIC PANEL - Abnormal; Notable for the following components:      Result Value   Potassium 3.4 (*)    CO2 19 (*)    Creatinine, Ser <0.30 (*)    Total Protein 8.3 (*)    ALT 66 (*)    Alkaline Phosphatase 133 (*)    All other components within normal limits  CBC WITH DIFFERENTIAL/PLATELET - Abnormal; Notable for the following  components:   RBC 4.06 (*)    Hemoglobin 10.7 (*)  HCT 34.3 (*)    RDW 18.8 (*)    All other components within normal limits  URINALYSIS, ROUTINE W REFLEX MICROSCOPIC - Abnormal; Notable for the following components:   Color, Urine AMBER (*)    APPearance CLOUDY (*)    Hgb urine dipstick SMALL (*)    Bacteria, UA MANY (*)    All other components within normal limits  I-STAT CHEM 8, ED - Abnormal; Notable for the following components:   BUN 21 (*)    Creatinine, Ser <0.20 (*)    Calcium, Ion 1.05 (*)    Hemoglobin 11.6 (*)    HCT 34.0 (*)    All other components within normal limits  RESP PANEL BY RT-PCR (RSV, FLU A&B, COVID)  RVPGX2  EXPECTORATED SPUTUM ASSESSMENT W GRAM STAIN, RFLX TO RESP C  URINE CULTURE  LACTIC ACID, PLASMA  PROTIME-INR  APTT  LACTIC ACID, PLASMA                                                                                                                         EKG  EKG Interpretation  Date/Time:  Tuesday June 22 2022 06:30:12 EST Ventricular Rate:  95 PR Interval:  132 QRS Duration: 90 QT Interval:  339 QTC Calculation: 427 R Axis:   48 Text Interpretation: Sinus rhythm Borderline low voltage, extremity leads Confirmed by Addison Lank [phone]) on 06/22/2022 6:54:16 AM       Radiology DG Abdomen 1 View  Result Date: 06/22/2022 CLINICAL DATA:  51 year old male with possible sepsis. Fever. Ileus. EXAM: ABDOMEN - 1 VIEW COMPARISON:  Abdominal radiographs 06/14/2022 and earlier. FINDINGS: Portable AP supine view at 0510 hours. Gastrostomy tube remains in place. Diffuse large and small bowel gas throughout the abdomen and pelvis again noted, not significantly changed over this series of exams, including at a similar appearance of the abdomen in June this year (12/12/2021. Stable visualized osseous structures. IMPRESSION: Chronic ileus gas pattern and gastrostomy tube not significantly changed. Electronically Signed   By: Genevie Ann M.D.   On:  06/22/2022 05:26   DG Chest Port 1 View  Result Date: 06/22/2022 CLINICAL DATA:  51 year old male with possible sepsis.  Fever. EXAM: PORTABLE CHEST 1 VIEW COMPARISON:  Portable chest 05/07/2022 and earlier. FINDINGS: Portable AP upright view at 0507 hours. Stable right chest dual lumen power port, PICC line, and tracheostomy. Elevated left hemidiaphragm and patchy, confluent left perihilar and lung base opacity has not significantly changed from last month. Contralateral right lung remains clear when allowing for portable technique. No background pulmonary edema. No pneumothorax. No cardiomegaly. Stable visible bowel gas pattern, gastrostomy tube. IMPRESSION: 1. Stable lines and tubes. 2. Chronic elevated left hemidiaphragm, left perihilar and lung base opacity which is not significantly changed from October and doubtful for acute infection. 3. No new cardiopulmonary abnormality. Electronically Signed   By: Genevie Ann M.D.   On: 06/22/2022 05:19    Medications Ordered in ED Medications  sodium chloride 0.9 %  bolus 1,000 mL (0 mLs Intravenous Stopped 06/22/22 0719)                                                                                                                                     Procedures Procedures  (including critical care time)  Medical Decision Making / ED Course   Medical Decision Making Amount and/or Complexity of Data Reviewed Labs: ordered. Decision-making details documented in ED Course. Radiology: ordered and independent interpretation performed. Decision-making details documented in ED Course. ECG/medicine tests: ordered and independent interpretation performed. Decision-making details documented in ED Course.  Risk Decision regarding hospitalization.    ALS patient, trach, PEG, PICC line dependent. Here with complications for PICC line. PICC team consulted who are able to flush the ports.  PICC line is nonfunctional.  Also reported to have low-grade fever  with increased secretions. Will obtain labs and assess for infectious source including pneumonia, viral infection. CBC without leukocytosis or anemia. Metabolic panel without significant electrolyte derangements or renal insufficiency. UA with bacteria but no nitrites or leukocytes concerning for infection Lactic acid normal Viral panel negative for COVID, influenza or RSV Sputum cultures sent and pending Chest x-ray without evidence of pneumonia.  No antibiotics needed at this time. Will await cultures.  On exam distended abdomen with a PEG tube appeared to be dirty with black sedimentous material. He no longer get feeds, but is getting medication through PEG.  KUB notable for stable ileus.  Spoke with Dr. Earleen Newport from IR to see if they were able to get the patient in today for PEG tube exchange.  If not to assist with scheduling for outpatient replacement.  Dr. Earleen Newport will look over the schedule and get back to [place].    Patient care turned over to oncoming provider. Patient case and results discussed in detail; please see their note for further ED managment.    Final Clinical Impression(s) / ED Diagnoses Final diagnoses:  Occlusion of peripherally inserted central catheter (PICC) line, initial encounter ([place])  Ileus ([place])  PEG (percutaneous endoscopic gastrostomy) adjustment/replacement/removal ([place])           This chart was dictated using voice recognition software.  Despite best efforts to proofread,  errors can occur which can change the documentation meaning.    Fatima Blank, MD 06/22/22 [phone]

## 2022-06-23 ENCOUNTER — Observation Stay (HOSPITAL_COMMUNITY): Payer: Medicare HMO

## 2022-06-23 ENCOUNTER — Other Ambulatory Visit: Payer: Self-pay

## 2022-06-23 DIAGNOSIS — J151 Pneumonia due to Pseudomonas: Secondary | ICD-10-CM | POA: Diagnosis not present

## 2022-06-23 DIAGNOSIS — G1221 Amyotrophic lateral sclerosis: Secondary | ICD-10-CM | POA: Diagnosis not present

## 2022-06-23 DIAGNOSIS — J9611 Chronic respiratory failure with hypoxia: Secondary | ICD-10-CM | POA: Diagnosis not present

## 2022-06-23 DIAGNOSIS — G909 Disorder of the autonomic nervous system, unspecified: Secondary | ICD-10-CM | POA: Diagnosis not present

## 2022-06-23 DIAGNOSIS — R0602 Shortness of breath: Secondary | ICD-10-CM | POA: Diagnosis not present

## 2022-06-23 DIAGNOSIS — R140 Abdominal distension (gaseous): Secondary | ICD-10-CM | POA: Diagnosis not present

## 2022-06-23 DIAGNOSIS — Z930 Tracheostomy status: Secondary | ICD-10-CM

## 2022-06-23 DIAGNOSIS — Z9911 Dependence on respirator [ventilator] status: Secondary | ICD-10-CM

## 2022-06-23 DIAGNOSIS — K567 Ileus, unspecified: Secondary | ICD-10-CM | POA: Diagnosis not present

## 2022-06-23 DIAGNOSIS — J9621 Acute and chronic respiratory failure with hypoxia: Secondary | ICD-10-CM | POA: Diagnosis not present

## 2022-06-23 LAB — COMPREHENSIVE METABOLIC PANEL
ALT: 133 U/L — ABNORMAL HIGH (ref 0–44)
AST: 78 U/L — ABNORMAL HIGH (ref 15–41)
Albumin: 3.5 g/dL (ref 3.5–5.0)
Alkaline Phosphatase: 144 U/L — ABNORMAL HIGH (ref 38–126)
Anion gap: 14 (ref 5–15)
BUN: 15 mg/dL (ref 6–20)
CO2: 19 mmol/L — ABNORMAL LOW (ref 22–32)
Calcium: 9.4 mg/dL (ref 8.9–10.3)
Chloride: 109 mmol/L (ref 98–111)
Creatinine, Ser: 0.41 mg/dL — ABNORMAL LOW (ref 0.61–1.24)
GFR, Estimated: >60 mL/min (ref >60)
Glucose, Bld: 51 mg/dL — ABNORMAL LOW (ref 70–99)
Potassium: 3.2 mmol/L — ABNORMAL LOW (ref 3.5–5.1)
Sodium: 142 mmol/L (ref 135–145)
Total Bilirubin: 1.2 mg/dL (ref 0.3–1.2)
Total Protein: 8.3 g/dL — ABNORMAL HIGH (ref 6.5–8.1)

## 2022-06-23 LAB — CBG MONITORING, ED
Glucose-Capillary: 37 mg/dL — CL (ref 70–99)
Glucose-Capillary: 104 mg/dL — ABNORMAL HIGH (ref 70–99)

## 2022-06-23 LAB — CBC
HCT: 33.7 % — ABNORMAL LOW (ref 39.0–52.0)
Hemoglobin: 10.4 g/dL — ABNORMAL LOW (ref 13.0–17.0)
MCH: 26.6 pg (ref 26.0–34.0)
MCHC: 30.9 g/dL (ref 30.0–36.0)
MCV: 86.2 fL (ref 80.0–100.0)
Platelets: 148 10*3/uL — ABNORMAL LOW (ref 150–400)
RBC: 3.91 MIL/uL — ABNORMAL LOW (ref 4.22–5.81)
RDW: 18.6 % — ABNORMAL HIGH (ref 11.5–15.5)
WBC: 9.6 10*3/uL (ref 4.0–10.5)
nRBC: 0 % (ref 0.0–0.2)

## 2022-06-23 LAB — PHOSPHORUS: Phosphorus: 3.5 mg/dL (ref 2.5–4.6)

## 2022-06-23 LAB — MAGNESIUM: Magnesium: 2.1 mg/dL (ref 1.7–2.4)

## 2022-06-23 MED ORDER — POTASSIUM CHLORIDE 10 MEQ/100ML IV SOLN
10.0000 meq | INTRAVENOUS | Status: AC
  Administered 2022-06-23 (×6): 10 meq via INTRAVENOUS
  Filled 2022-06-23 (×6): qty 100

## 2022-06-23 MED ORDER — SODIUM CHLORIDE 0.9% FLUSH: 10.0000 mL | INTRAVENOUS | Status: DC | PRN

## 2022-06-23 MED ORDER — CHLORHEXIDINE GLUCONATE CLOTH 2 % EX PADS
6.0000 | MEDICATED_PAD | Freq: Every day | CUTANEOUS | Status: DC
  Administered 2022-06-23 – 2022-07-01 (×13): 6 via TOPICAL

## 2022-06-23 MED ORDER — DEXTROSE-NACL 5-0.45 % IV SOLN: INTRAVENOUS | Status: DC

## 2022-06-23 MED ORDER — DEXTROSE 50 % IV SOLN
1.0000 | Freq: Once | INTRAVENOUS | Status: AC
  Administered 2022-06-23: 50 mL via INTRAVENOUS
  Filled 2022-06-23: qty 50

## 2022-06-23 MED ORDER — INSULIN ASPART 100 UNIT/ML IJ SOLN: 0.0000 [IU] | INTRAMUSCULAR | Status: DC

## 2022-06-23 NOTE — ED Notes (Signed)
Patient repositioned in bed by this tech

## 2022-06-23 NOTE — Inpatient Diabetes Management (Signed)
Inpatient Diabetes Program Recommendations  AACE/ADA: New Consensus Statement on Inpatient Glycemic Control (2015)  Target Ranges:  Prepandial:   less than 140 mg/dL      Peak postprandial:   less than 180 mg/dL (1-2 hours)      Critically ill patients:  140 - 180 mg/dL   Lab Results  Component Value Date   GLUCAP 102 (H) 01/08/2022   HGBA1C 5.4 12/09/2021    Review of Glycemic Control  Latest Reference Range & Units 06/22/22 05:32 06/23/22 06:00  Glucose 70 - 99 mg/dL 82 51 (L)  (L): Data is abnormally low Diabetes history: No DM Outpatient Diabetes medications: none Current orders for Inpatient glycemic control: none  Inpatient Diabetes Program Recommendations:    Patient low on glucose serum this AM. Consider adding CBGs Q4H?   Thanks, Bronson Curb, MSN, RNC-OB Diabetes Coordinator [phone] (8a-5p)

## 2022-06-23 NOTE — ED Notes (Signed)
Pts wife at bedside. Pt repositioned in room with assistance. Updated on plan of care.

## 2022-06-23 NOTE — Progress Notes (Incomplete)
Consult received from MD to culture PICC type placement. Education given to MD regarding PICC tip culture. MD placed order to prcede with culture of PICC tip when PICC line pulled. Notified nurse to place IV consult to pull PICC line when patient has been transferred to unit.

## 2022-06-23 NOTE — Progress Notes (Signed)
Transition of Care [facility]) - Emergency Department Mini Assessment   Patient Details  Name: Jerry [name] MRN: [id number] Date of Birth: 1971/03/21  Transition of Care [facility] CM/SW Contact:    Jerry Mandril, RN Phone Number: 06/23/2022, 9:00 AM   Clinical Narrative: Pt currently active with [facility] for Home Health services RN as confirmed by [name] with Jerry [name] of [facility].  Pt will resume [place] services of RN. No DME needs identified at this time.    ED Mini Assessment: What brought you to the Emergency Department? : (P) Fever  Barriers to Discharge: (P) Continued Medical Work up     Means of departure: (P) Not know       Patient Contact and Communications        ,                 Admission diagnosis:  Fever [R50.9] Patient Active Problem List   Diagnosis Date Noted   Fever 06/22/2022   Chronic respiratory failure with hypoxia ([place]) 06/22/2022   Ileus ([place]) 05/19/2022   Abdominal bloating 05/19/2022   Acute on chronic respiratory failure with hypoxia ([place]) 01/11/2022   Pseudomonas pneumonia ([place]) 01/06/2022   Autonomic dysfunction 01/06/2022   Ventilator dependent ([place]) 01/06/2022   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   Pressure injury of skin 12/27/2021   Cardiac arrest ([place])    Acute hypoxemic respiratory failure ([place])    Septic shock ([place]) 12/09/2021   Anxiety 11/18/2021   Bed sore on buttock, right, unstageable ([place]) 11/18/2021   Severe protein-calorie malnutrition ([place]) 11/13/2021   Microscopic hematuria 10/29/2021   S/P percutaneous endoscopic gastrostomy (PEG) tube placement ([place]) 10/29/2021   ALS (amyotrophic lateral sclerosis) ([place]) 01/11/2019   PCP:  Jerry Sizer, MD Pharmacy:   [id number] [address] [place] [address] Phone: [phone] Fax: [phone]

## 2022-06-23 NOTE — ED Notes (Signed)
Wife of pt requesting medicine to help reduce pt's oral secretions. Communicated to [place] MD at this time. Awaiting further orders.

## 2022-06-23 NOTE — Progress Notes (Signed)
Pt transported on vent from Trauma B to Room 19, then from room 19 to ED Room 12 w/o any complications.

## 2022-06-23 NOTE — ED Notes (Signed)
Patient repositioned for comfort.

## 2022-06-23 NOTE — ED Notes (Signed)
Pt repositioned in bed, range of motion in all extremities performed.

## 2022-06-23 NOTE — ED Notes (Signed)
Pt set up in room. Computer device at bedside for pt use. Call bell within reach. Updated on plan of care. A&O x4.

## 2022-06-23 NOTE — ED Notes (Signed)
Updated patient family member on patient status.

## 2022-06-23 NOTE — ED Notes (Signed)
Friend at bedside.

## 2022-06-23 NOTE — ED Notes (Signed)
Pt pulled up in bed, repositioned, and performed range of motion exercises

## 2022-06-23 NOTE — ED Notes (Signed)
Pt had BM. Linen changed.

## 2022-06-23 NOTE — Care Management Obs Status (Signed)
MEDICARE OBSERVATION STATUS NOTIFICATION   Patient Details  Name: Jerry [name] MRN: [id number] Date of Birth: 19-Dec-1970   Medicare Observation Status Notification Given:  Yes    Fuller Mandril, RN 06/23/2022, 9:47 AM

## 2022-06-23 NOTE — Progress Notes (Signed)
Jerry [name] NID:[id number] DOB: [birth year] DOA: 06/22/2022 PCP: [name], MD   Subj:  51 y.o. BM PMHx  ALS with chronic tracheostomy/ventilator-dependent hypoxic respiratory failure, autonomic dysfunction, and ileus on TPN   Presents to the emergency department with fever.   Patient was discharged home from [place] 1 week ago and is being cared for primarily by his wife with home health RN.  His wife was having difficulty detaching TPN tubing from his PICC and called home health RN for assistance.  Home health RN reports a fever of 100.5 F and abdominal distention.  He was sent to the ED for evaluation of this.     ED Course: Upon arrival to the ED, patient is found to be afebrile and saturating well on his usual vent settings with systolic blood pressure of 91 and greater.  EKG demonstrates sinus rhythm, chest x-ray notable for some chronic findings but no acute cardiopulmonary disease, and KUB with no significant change in ileus or G-tube placement.  Labs notable for potassium 3.4, bicarbonate 19, alkaline phosphatase 133, AST 66, hemoglobin 10.7, lactic acid 0.9, and negative COVID/influenza/RSV PCR.   PCCM was consulted by the ED physician, evaluated the patient in the emergency department, and recommends observation on the hospitalist service given patient's increased risk of decompensation.    Obj: 12/20 A/O x 4 communicate through eye tracking computer, afebrile overnight.,   Objective: VITAL SIGNS: Temp: 99.1 F (37.3 C) (12/20 1100) Temp Source: Axillary (12/20 1100) BP: 115/75 (12/20 1130) Pulse Rate: 95 (12/20 1130)   Vent settings Mode PRVC Vt Set 500 ml Set rate 16 FiO2 30% PEEP 5 SpO2 99%    Intake/Output Summary (Last 24 hours) at 06/23/2022 1154 Last data filed at 06/23/2022 0900 Gross per 24 hour  Intake 3 ml  Output --  Net 3 ml     Exam: Physical Exam:  General: A/O x 4, communicate through eye tracking computer, positive chronic respiratory  distress Eyes: negative scleral hemorrhage, negative anisocoria, negative icterus ENT: Negative Runny nose, negative gingival bleeding, Neck:  Negative scars, masses, torticollis, lymphadenopathy, JVD Lungs: please breath sounds, positive rhonchi.  Positive RIGHT port placed 2021 by [facility].   Cardiovascular: Regular rate and rhythm without murmur gallop or rub normal S1 and S2 Abdomen: negative abdominal pain, positive mild distention, negative soft, bowel sounds, no rebound, no ascites, no appreciable mass Extremities: No significant cyanosis, clubbing, or edema bilateral lower extremities Skin: Negative rashes, lesions, ulcers Psychiatric:  Negative depression, negative anxiety, negative fatigue, negative mania  Central nervous system:  Cranial nerves II through XII intact, tongue/uvula midline, all extremities muscle strength 5/5, sensation intact throughout, negative dysarthria, negative expressive aphasia, negative receptive aphasia.    Mobility Assessment (last 72 hours)     Mobility Assessment   No documentation.             DVT prophylaxis: Lovenox Code Status: Full Family Communication: 12/20 wife at bedside for discussion of plan of care all questions answered Status is: Inpatient    Dispo: The patient is from: Home              Anticipated d/c is to: Home              Anticipated d/c date is: 3 days              Patient currently is not medically stable to d/c.    Procedures/Significant Events: On admission: Right 3 lumen PICC line On admission RIGHT chest wall Port-A-Cath  placed 2021 [facility].  Per RN Bobbye [place] White ports nonfunctional 12/19 PEG tube replaced new 24 Fr Enfit hub-type gastrostomy tube.   Consultants:  ECM   Cultures 12/19 sputum Gram positive cocci in pairs, culture pending 12/19 blood NGTD 12/28 respiratory virus panel pending  Antimicrobials:    Assessment & Plan: Covid vaccination;   Principal Problem:    Fever Active Problems:   ALS (amyotrophic lateral sclerosis) ([place])   Autonomic dysfunction   Ventilator dependent ([place])   Tracheostomy dependent (HCC)   Ileus (HCC)   Chronic respiratory failure with hypoxia (HCC)    Fever  - Presents with fever 100.5 F at home  - Afebrile here with normal lactate, no SIRS criteria or evidence for acute infection on ED workup, COVID/Flu/RSV pcr negative   - Sputum culture collected in ED, will also culture blood and urine, follow clinical course - 12/19 PEG tube changed -12/20 consult IV team to remove partially functioning PICC line will send for culture. -12/20 respiratory virus panel pending -12/20 PCXR pending -12/20 stain positive for gram-positive cocci in pairs, will hold antibiotics at this time negative leukocytosis, negative fever, but given patient's condition short fused for starting empiric antibiotics.   Chronic hypoxic respiratory failure  - Stable on home vent settings, appreciate PCCM involvement    -12/20 stable on vent settings.  See vent settings above   ALS  - Continue trach and PEG care, consult palliative care    Chronic ileus  -12/19 IR replaced PEG -12/20 per wife chronic has been there [facility], prior to going home.  Receives no food/drink except for medication through PEG. -12/20 per [facility] no training given for accessing PEG tube to place on intermittent suction.   Autonomic dysfunction  - Normal HR and BP in ED, continue to monitor    Hypoglycemia - 12/20 D50 1 amp - 12/20 D5-0.45% saline 38m/hr   Hypokalemia - Potassium goal>4 - 12/20 Potassium IV 60 mEq  Pressure injury stage I Pressure Injury 12/26/21 Ear Right Stage 1 -  Intact skin with non-blanchable redness of a localized area usually over a bony prominence. (Active)  12/26/21 2000  Location: Ear  Location Orientation: Right  Staging: Stage 1 -  Intact skin with non-blanchable redness of a  localized area usually over a bony prominence.  Wound Description (Comments):   Present on Admission:    Protein calorie malnutrition - 12/20 patient did not receive his TPN today, unknown reason. -[place] never placed, therefore patient did not receive his TPN.  Will start TPN on 12/21 -12/20 2x ports  PICC line nonfunctional.  Consult IV team for replacement of PICC line with 2 Lumens. -12/20 IV team order:Access patient's right chest wall port ensure that its functional.  Then remove patient's PICC line and send tip for lab for culture  Goals of care - 12/20 spoke with RN JLindwho understands complexity of patient's medical problems.  Will attempt to place patient either and 46M or 4NP, or another appropriate unit.       Mobility Assessment (last 72 hours)     Mobility Assessment   No documentation.                    Care during the described time interval was provided by me .  I have reviewed this patient's available data, including medical history, events of note, physical examination, and  all test results as part of my evaluation.

## 2022-06-24 DIAGNOSIS — L89811 Pressure ulcer of head, stage 1: Secondary | ICD-10-CM | POA: Diagnosis present

## 2022-06-24 DIAGNOSIS — T82594A Other mechanical complication of infusion catheter, initial encounter: Secondary | ICD-10-CM | POA: Diagnosis present

## 2022-06-24 DIAGNOSIS — E876 Hypokalemia: Secondary | ICD-10-CM | POA: Diagnosis present

## 2022-06-24 DIAGNOSIS — J9503 Malfunction of tracheostomy stoma: Secondary | ICD-10-CM | POA: Diagnosis not present

## 2022-06-24 DIAGNOSIS — Z833 Family history of diabetes mellitus: Secondary | ICD-10-CM | POA: Diagnosis not present

## 2022-06-24 DIAGNOSIS — Z930 Tracheostomy status: Secondary | ICD-10-CM | POA: Diagnosis not present

## 2022-06-24 DIAGNOSIS — D638 Anemia in other chronic diseases classified elsewhere: Secondary | ICD-10-CM | POA: Diagnosis present

## 2022-06-24 DIAGNOSIS — J151 Pneumonia due to Pseudomonas: Secondary | ICD-10-CM | POA: Diagnosis present

## 2022-06-24 DIAGNOSIS — G1221 Amyotrophic lateral sclerosis: Secondary | ICD-10-CM | POA: Diagnosis present

## 2022-06-24 DIAGNOSIS — K6389 Other specified diseases of intestine: Secondary | ICD-10-CM | POA: Diagnosis not present

## 2022-06-24 DIAGNOSIS — K315 Obstruction of duodenum: Secondary | ICD-10-CM | POA: Diagnosis present

## 2022-06-24 DIAGNOSIS — Z515 Encounter for palliative care: Secondary | ICD-10-CM | POA: Diagnosis not present

## 2022-06-24 DIAGNOSIS — Z7189 Other specified counseling: Secondary | ICD-10-CM | POA: Diagnosis not present

## 2022-06-24 DIAGNOSIS — R509 Fever, unspecified: Secondary | ICD-10-CM | POA: Diagnosis not present

## 2022-06-24 DIAGNOSIS — Z8249 Family history of ischemic heart disease and other diseases of the circulatory system: Secondary | ICD-10-CM | POA: Diagnosis not present

## 2022-06-24 DIAGNOSIS — J9611 Chronic respiratory failure with hypoxia: Secondary | ICD-10-CM | POA: Diagnosis present

## 2022-06-24 DIAGNOSIS — G909 Disorder of the autonomic nervous system, unspecified: Secondary | ICD-10-CM | POA: Diagnosis present

## 2022-06-24 DIAGNOSIS — Z1152 Encounter for screening for COVID-19: Secondary | ICD-10-CM | POA: Diagnosis not present

## 2022-06-24 DIAGNOSIS — E162 Hypoglycemia, unspecified: Secondary | ICD-10-CM | POA: Diagnosis not present

## 2022-06-24 DIAGNOSIS — J9612 Chronic respiratory failure with hypercapnia: Secondary | ICD-10-CM | POA: Diagnosis present

## 2022-06-24 DIAGNOSIS — Z9911 Dependence on respirator [ventilator] status: Secondary | ICD-10-CM | POA: Diagnosis not present

## 2022-06-24 DIAGNOSIS — Y712 Prosthetic and other implants, materials and accessory cardiovascular devices associated with adverse incidents: Secondary | ICD-10-CM | POA: Diagnosis present

## 2022-06-24 DIAGNOSIS — E46 Unspecified protein-calorie malnutrition: Secondary | ICD-10-CM | POA: Diagnosis present

## 2022-06-24 DIAGNOSIS — Z8616 Personal history of COVID-19: Secondary | ICD-10-CM | POA: Diagnosis not present

## 2022-06-24 DIAGNOSIS — Z91013 Allergy to seafood: Secondary | ICD-10-CM | POA: Diagnosis not present

## 2022-06-24 DIAGNOSIS — Z8661 Personal history of infections of the central nervous system: Secondary | ICD-10-CM | POA: Diagnosis not present

## 2022-06-24 DIAGNOSIS — Z8674 Personal history of sudden cardiac arrest: Secondary | ICD-10-CM | POA: Diagnosis not present

## 2022-06-24 DIAGNOSIS — K567 Ileus, unspecified: Secondary | ICD-10-CM | POA: Diagnosis present

## 2022-06-24 DIAGNOSIS — Z79899 Other long term (current) drug therapy: Secondary | ICD-10-CM | POA: Diagnosis not present

## 2022-06-24 LAB — COMPREHENSIVE METABOLIC PANEL
ALT: 83 U/L — ABNORMAL HIGH (ref 0–44)
AST: 36 U/L (ref 15–41)
Albumin: 3.1 g/dL — ABNORMAL LOW (ref 3.5–5.0)
Alkaline Phosphatase: 119 U/L (ref 38–126)
Anion gap: 8 (ref 5–15)
BUN: 9 mg/dL (ref 6–20)
CO2: 19 mmol/L — ABNORMAL LOW (ref 22–32)
Calcium: 8.9 mg/dL (ref 8.9–10.3)
Chloride: 112 mmol/L — ABNORMAL HIGH (ref 98–111)
Creatinine, Ser: 0.33 mg/dL — ABNORMAL LOW (ref 0.61–1.24)
GFR, Estimated: >60 mL/min (ref >60)
Glucose, Bld: 97 mg/dL (ref 70–99)
Potassium: 3.6 mmol/L (ref 3.5–5.1)
Sodium: 139 mmol/L (ref 135–145)
Total Bilirubin: 1.1 mg/dL (ref 0.3–1.2)
Total Protein: 7.1 g/dL (ref 6.5–8.1)

## 2022-06-24 LAB — RESPIRATORY PANEL BY PCR

## 2022-06-24 LAB — CBC WITH DIFFERENTIAL/PLATELET
Abs Immature Granulocytes: 0.04 10*3/uL (ref 0.00–0.07)
Basophils Absolute: 0 10*3/uL (ref 0.0–0.1)
Basophils Relative: 0 %
Eosinophils Absolute: 0.1 10*3/uL (ref 0.0–0.5)
Eosinophils Relative: 1 %
HCT: 29.2 % — ABNORMAL LOW (ref 39.0–52.0)
Hemoglobin: 9.4 g/dL — ABNORMAL LOW (ref 13.0–17.0)
Immature Granulocytes: 1 %
Lymphocytes Relative: 11 %
Lymphs Abs: 0.9 10*3/uL (ref 0.7–4.0)
MCH: 27.6 pg (ref 26.0–34.0)
MCHC: 32.2 g/dL (ref 30.0–36.0)
MCV: 85.6 fL (ref 80.0–100.0)
Monocytes Absolute: 0.5 10*3/uL (ref 0.1–1.0)
Monocytes Relative: 6 %
Neutro Abs: 6.6 10*3/uL (ref 1.7–7.7)
Neutrophils Relative %: 81 %
Platelets: 153 10*3/uL (ref 150–400)
RBC: 3.41 MIL/uL — ABNORMAL LOW (ref 4.22–5.81)
RDW: 18.6 % — ABNORMAL HIGH (ref 11.5–15.5)
WBC: 8.2 10*3/uL (ref 4.0–10.5)
nRBC: 0 % (ref 0.0–0.2)

## 2022-06-24 LAB — URINE CULTURE

## 2022-06-24 LAB — PHOSPHORUS: Phosphorus: 2 mg/dL — ABNORMAL LOW (ref 2.5–4.6)

## 2022-06-24 LAB — TRIGLYCERIDES: Triglycerides: 24 mg/dL (ref <150)

## 2022-06-24 LAB — MAGNESIUM: Magnesium: 1.8 mg/dL (ref 1.7–2.4)

## 2022-06-24 LAB — GLUCOSE, CAPILLARY
Glucose-Capillary: 75 mg/dL (ref 70–99)
Glucose-Capillary: 80 mg/dL (ref 70–99)
Glucose-Capillary: 99 mg/dL (ref 70–99)
Glucose-Capillary: 106 mg/dL — ABNORMAL HIGH (ref 70–99)
Glucose-Capillary: 101 mg/dL — ABNORMAL HIGH (ref 70–99)
Glucose-Capillary: 97 mg/dL (ref 70–99)
Glucose-Capillary: 125 mg/dL — ABNORMAL HIGH (ref 70–99)

## 2022-06-24 LAB — MRSA NEXT GEN BY PCR, NASAL: MRSA by PCR Next Gen: NOT DETECTED

## 2022-06-24 MED ORDER — SODIUM CHLORIDE 0.9 % IV SOLN: INTRAVENOUS | Status: DC

## 2022-06-24 MED ORDER — GLYCOPYRROLATE 0.2 MG/ML IJ SOLN
0.1000 mg | Freq: Two times a day (BID) | INTRAMUSCULAR | Status: DC
  Administered 2022-06-24 – 2022-07-01 (×15): 0.1 mg via INTRAVENOUS
  Filled 2022-06-24 (×16): qty 1

## 2022-06-24 MED ORDER — SCOPOLAMINE 1 MG/3DAYS TD PT72
1.0000 | MEDICATED_PATCH | TRANSDERMAL | Status: DC
  Administered 2022-06-24 – 2022-06-30 (×3): 1.5 mg via TRANSDERMAL
  Filled 2022-06-24 (×4): qty 1

## 2022-06-24 MED ORDER — SODIUM CHLORIDE 0.9 % IV SOLN: INTRAVENOUS | Status: DC | PRN

## 2022-06-24 MED ORDER — SODIUM CHLORIDE 0.9 % IV SOLN
100.0000 mg | Freq: Two times a day (BID) | INTRAVENOUS | Status: DC
  Administered 2022-06-24 – 2022-06-25 (×2): 100 mg via INTRAVENOUS
  Filled 2022-06-24 (×4): qty 100

## 2022-06-24 MED ORDER — TRAVASOL 10 % IV SOLN
INTRAVENOUS | Status: AC
  Filled 2022-06-24: qty 1020

## 2022-06-24 MED ORDER — ORAL CARE MOUTH RINSE: 15.0000 mL | OROMUCOSAL | Status: DC | PRN

## 2022-06-24 MED ORDER — PIPERACILLIN-TAZOBACTAM 3.375 G IVPB
3.3750 g | Freq: Three times a day (TID) | INTRAVENOUS | Status: DC
  Administered 2022-06-24 – 2022-06-25 (×4): 3.375 g via INTRAVENOUS
  Filled 2022-06-24 (×4): qty 50

## 2022-06-24 MED ORDER — ORAL CARE MOUTH RINSE
15.0000 mL | OROMUCOSAL | Status: DC
  Administered 2022-06-24 – 2022-07-02 (×97): 15 mL via OROMUCOSAL

## 2022-06-24 MED ORDER — INSULIN ASPART 100 UNIT/ML IJ SOLN
0.0000 [IU] | Freq: Four times a day (QID) | INTRAMUSCULAR | Status: DC
  Administered 2022-06-25: 1 [IU] via SUBCUTANEOUS
  Administered 2022-06-25: 2 [IU] via SUBCUTANEOUS

## 2022-06-24 NOTE — Progress Notes (Signed)
Jerry [name] YHC:[id number] DOB: 1971/03/25 DOA: 06/22/2022 PCP: [name], MD   Subj:  51 y.o. BM PMHx  ALS with chronic tracheostomy/ventilator-dependent hypoxic respiratory failure, autonomic dysfunction, and ileus on TPN   Presents to the emergency department with fever.   Patient was discharged home from [place] 1 week ago and is being cared for primarily by his wife with home health RN.  His wife was having difficulty detaching TPN tubing from his PICC and called home health RN for assistance.  Home health RN reports a fever of 100.5 F and abdominal distention.  He was sent to the ED for evaluation of this.     ED Course: Upon arrival to the ED, patient is found to be afebrile and saturating well on his usual vent settings with systolic blood pressure of 91 and greater.  EKG demonstrates sinus rhythm, chest x-ray notable for some chronic findings but no acute cardiopulmonary disease, and KUB with no significant change in ileus or G-tube placement.  Labs notable for potassium 3.4, bicarbonate 19, alkaline phosphatase 133, AST 66, hemoglobin 10.7, lactic acid 0.9, and negative COVID/influenza/RSV PCR.   PCCM was consulted by the ED physician, evaluated the patient in the emergency department, and recommends observation on the hospitalist service given patient's increased risk of decompensation.    Obj: 12/21 A/O x 4.  Communicates through my chart and computer and states feeling well.  S/p PICC line tip sent for culture.  Also appears that pharmacy has mixed that is TPN, so should be started today.     Objective: VITAL SIGNS: Temp: 98.5 F (36.9 C) (12/21 0754) Temp Source: Axillary (12/21 0754) BP: 96/63 (12/21 0900) Pulse Rate: 76 (12/21 0900)   Vent settings 12/21 Mode PRVC Vt Set 500 ml Set rate 16 FiO2 40% PEEP 5 SpO2 100%    Intake/Output Summary (Last 24 hours) at 06/24/2022 1006 Last data filed at 06/24/2022 0800 Gross per 24 hour  Intake 1534.59 ml  Output 850  ml  Net 684.59 ml     Physical Exam:  General: A/O x 4, (communicates through eye tracking computer), positive chronic respiratory distress Eyes: negative scleral hemorrhage, negative anisocoria, negative icterus ENT: Negative Runny nose, negative gingival bleeding, Neck:  Negative scars, masses, torticollis, lymphadenopathy, JVD Lungs: improved air movement bilaterally, decreased rhonchi, without wheezes or crackles Cardiovascular: Regular rate and rhythm without murmur gallop or rub normal S1 and S2 Abdomen: negative abdominal pain, nondistended, positive soft, bowel sounds, no rebound, no ascites, no appreciable mass.  Positive PEG tube in place Extremities: No significant cyanosis, clubbing, or edema bilateral lower extremities Skin: Negative rashes, lesions, ulcers Psychiatric:  Negative depression, negative anxiety, negative fatigue, negative mania  Central nervous system:  Cranial nerves II through XII intact, tongue/uvula midline, unable to move any of his extremities.  Exam:   Mobility Assessment (last 72 hours)     Mobility Assessment   No documentation.             DVT prophylaxis: Lovenox Code Status: Full Family Communication: 12/20 wife at bedside for discussion of plan of care all questions answered Status is: Inpatient    Dispo: The patient is from: Home              Anticipated d/c is to: Home              Anticipated d/c date is: 3 days              Patient currently is not medically stable  to d/c.    Procedures/Significant Events: On admission: Right 3 lumen PICC line On admission RIGHT chest wall Port-A-Cath placed 2021 [facility].  Per RN Bobbye [place] White ports nonfunctional 12/19 PEG tube replaced new 24 Fr Enfit hub-type gastrostomy tube 12/21 right arm PICC line removed and tip sent to culture..   Consultants:  ECM   Cultures 12/19 sputum Gram positive negative rods susceptibilities pending: Reintubated 12/19 blood NGTD 12/28  respiratory virus panel pending  Antimicrobials: Anti-infectives (From admission, onward)    Start     Dose/Rate Route Frequency Ordered Stop   06/24/22 1400  piperacillin-tazobactam (ZOSYN) IVPB 3.375 g        3.375 g 12.5 mL/hr over 240 Minutes Intravenous Every 8 hours 06/24/22 1049     06/24/22 1145  doxycycline (VIBRAMYCIN) 100 mg in sodium chloride 0.9 % 250 mL IVPB        100 mg 125 mL/hr over 120 Minutes Intravenous Every 12 hours 06/24/22 1049           Assessment & Plan: Covid vaccination;   Principal Problem:   Fever Active Problems:   ALS (amyotrophic lateral sclerosis) ([place])   Autonomic dysfunction   Ventilator dependent ([place])   Tracheostomy dependent (HCC)   Ileus (HCC)   Chronic respiratory failure with hypoxia (HCC)    Fever  - Presents with fever 100.5 F at home  - Afebrile here with normal lactate, no SIRS criteria or evidence for acute infection on ED workup, COVID/Flu/RSV pcr negative   - Sputum culture collected in ED, will also culture blood and urine, follow clinical course - 12/19 PEG tube changed -12/20 consult IV team to remove partially functioning PICC line will send for culture. -12/20 respiratory virus panel pending -12/20 PCXR pending -12/20 stain positive for gram-positive cocci in pairs, will hold antibiotics at this time negative leukocytosis, negative fever, but given patient's condition short fused for starting empiric antibiotics. - 12/21 sputum positive gram-negative rods (Haemophilus influenzae, Bordetella pertussis, Legionella pneumophila, and Acinetobacter baumannii?) -12/21 discussed case with pharmacy they concur that best to empirically start patient on antibiotics given his immunocompromise state.   Chronic hypoxic respiratory failure  - Stable on home vent settings, appreciate PCCM involvement    -12/20 stable on vent settings.  See vent settings above   ALS  - Continue trach and PEG care, consult palliative care     Chronic ileus  -12/19 IR replaced PEG -12/20 per wife chronic has been there [facility], prior to going [facility].  Receives no food/drink except for medication through PEG. -12/20 per wife [facility] no training given for accessing PEG tube to place on intermittent suction. -12/21 RN Sarah to give instructions to wife when she comes to hospital today on how to look up patient's PEG tube to home suction, in order to provide intermittent suction when required.   Autonomic dysfunction  - Normal HR and BP in ED, continue to monitor    Hypoglycemia - 12/20 D50 1 amp - 12/20 D5-0.45% saline 37m/hr   Hypokalemia - Potassium goal>4 - 12/20 Potassium IV 60 mEq  Hypophosphatemia - Phosphorus goal> 2.5 - 12/21 slightly low however patient started TPN today we will see if it corrects overnight.  Pressure injury stage I Pressure Injury 12/26/21 Ear Right Stage 1 -  Intact skin with non-blanchable redness of a localized area usually over a bony prominence. (Active)  12/26/21 2000  Location: Ear  Location Orientation: Right  Staging:  Stage 1 -  Intact skin with non-blanchable redness of a localized area usually over a bony prominence.  Wound Description (Comments):   Present on Admission:    Anemia unspecified -12/21 most likely anemia of chronic disease however will verify. - 12/21 anemia panel pending - 12/21 occult blood pending  Protein calorie malnutrition - 12/20 patient did not receive his TPN today, unknown reason. -12/20 spoke with [place], TPN consult never placed, therefore patient did not receive his TPN.  Will start TPN on 12/21 -12/20 2x ports  PICC line nonfunctional.  Consult IV team for replacement of PICC line with 2 Lumens. -12/20 IV team order:Access patient's right chest wall port ensure that its functional.  Then remove patient's PICC line and send tip for lab for culture - 12/21 pharmacy restarting patient's home TPN -2000 ml  over 24 hours, rate of 83.3 ml/hr -100 g protein, 250 g dextrose, 56 g Smof lipids, 1809 Kcal -NaCl 10 mEq, NaAcet 60 mEq, Kcl 16 mEq, Kphos 30 mmol, Mg 10 mEq, CaGluc 9 mEq MVI, Trace elements   Goals of care - 12/20 spoke with RN [place] who understands complexity of patient's medical problems.  Will attempt to place patient either and 1M or 4NP, or another appropriate unit.       Mobility Assessment (last 72 hours)     Mobility Assessment   No documentation.           Time: 50 minutes         Care during the described time interval was provided by me .  I have reviewed this patient's available data, including medical history, events of note, physical examination, and all test results as part of my evaluation.

## 2022-06-24 NOTE — Progress Notes (Signed)
Initial Nutrition Assessment  DOCUMENTATION CODES:   Not applicable  INTERVENTION:   TPN to meet nutrition needs    NUTRITION DIAGNOSIS:   Increased nutrient needs related to  (infection/fever) as evidenced by estimated needs.  GOAL:   Patient will meet greater than or equal to 90% of their needs  MONITOR:   Weight trends, I & O's  REASON FOR ASSESSMENT:   Consult New TPN/TNA  ASSESSMENT:   Pt with PMH of ALS with chronic tracheostomy/ventilator-dependent hypoxic respiratory failure, autonomic dysfunction, and ileus on TPN admitted from home with fever. Pt discharged from [facility] one week PTA.   Home TPN starting this afternoon.  Per abd xray pt with persistent ileus. PEG replaced 12/19.   Pt on chronic vent support via trach.    Spoke with pt who communicates with bedside device.  He reports that once he was d/c'ed to [facility] he developed an ileus and had to go on TPN and had not been using his PEG tube for the last 5-6 months. He reports that for a couple of days a while ago they resumed his TF but this was quickly d/c'ed due to persistent ileus.   Pt's weight in 2022 was 180 lb he currently is 126 lb. Weight slightly higher than in 01/2022. Pt with moderate to severe fat depletion. Pt does not meet criteria for malnutrition at his time as muscle depletion cannot be used as a criteria.    12/19 24 F Enfit PEG placed  Medications reviewed and include: SSI D5 1/2 NS @ 75 ml/hr TPN starting this PM @ 85 ml/hr providing 1847 kcal and 102 grams protein   Labs reviewed: PO4 2.0 - 15 mmol/L Phos in TPN  CBG's: 75-99  NUTRITION - FOCUSED PHYSICAL EXAM:  Flowsheet Row Most Recent Value  Orbital Region Moderate depletion  Upper Arm Region Moderate depletion  Thoracic and Lumbar Region Severe depletion  Buccal Region Moderate depletion  Temple Region Moderate depletion  Clavicle Bone Region Severe depletion  Clavicle and Acromion Bone Region Severe depletion  Scapular  Bone Region Unable to assess  Dorsal Hand Unable to assess  Patellar Region Unable to assess  Anterior Thigh Region Unable to assess  Posterior Calf Region Unable to assess  Edema (RD Assessment) None  Hair Reviewed  Eyes Reviewed  Mouth Reviewed  Skin Reviewed  Nails Reviewed       Diet Order:   Diet Order             Diet NPO time specified  Diet effective now                   EDUCATION NEEDS:   No education needs have been identified at this time  Skin:  Skin Assessment: Reviewed RN Assessment  Last BM:  12/20 x 2; type 7  Height:   Ht Readings from Last 1 Encounters:  06/23/22 '5\' 5"'$  (1.651 m)    Weight:   Wt Readings from Last 1 Encounters:  06/24/22 57.3 kg   BMI:  Body mass index is 21.02 kg/m.  Estimated Nutritional Needs:   Kcal:  1800-1900  Protein:  95-105 grams  Fluid:  >1.8 L/day  Lockie Pares., RD, LDN, CNSC See AMiON for contact information

## 2022-06-24 NOTE — Progress Notes (Signed)
Pharmacy Antibiotic Note  Jerry [name] is a 51 y.o. male admitted on 06/22/2022 with pneumonia. Patient with ALS and chronic trach admitted with fever 100, wbc wnl sputum cx with GNR - recent pseudomonas pna.   Pharmacy has been consulted for doxycycline and zosyn  dosing.  Plan: Doxycycline '100mg'$  IV q12h Zosyn 3.375gm IV q8h EI  Height: '5\' 5"'$  (165.1 cm) Weight: 57.3 kg (126 lb 5.2 oz) IBW/kg (Calculated) : 61.5  Temp (24hrs), Avg:98.7 F (37.1 C), Min:97.9 F (36.6 C), Max:99.9 F (37.7 C)  Recent Labs  Lab 06/22/22 0513 06/22/22 0532 06/23/22 0600 06/24/22 0417  WBC 8.4  --  9.6 8.2  CREATININE <0.30* <0.20* 0.41* 0.33*  LATICACIDVEN 0.9  --   --   --     Estimated Creatinine Clearance: 89.5 mL/min (A) (by C-G formula based on SCr of 0.33 mg/dL (L)).    Allergies  Allergen Reactions   Crab [Shellfish Allergy] Rash    Antimicrobials this admission:  Dose adjustments this admission:   Microbiology results:  BCx: ngtd  UCx:  mult species  Sputum:  moderate pseudomonas   MRSA PCR: neg   Bonnita Nasuti Pharm.D. CPP, BCPS Clinical Pharmacist [phone] 06/24/2022 1:57 PM

## 2022-06-24 NOTE — Progress Notes (Addendum)
PICC line removed without complication. Pressure held upon removal. Vaseline and gauze dressing applied. No bleeding, redness or swelling at site. Per MD order tip cut and left with unit RN to send to lab for culture

## 2022-06-24 NOTE — Progress Notes (Signed)
[facility] Progress Note Patient Name: Jerry [name] DOB: Nov 11, 1970 MRN: [id number]   Date of Service  06/24/2022  HPI/Events of Note  67 M hx of ALS and cardiac arrest s/p trach/Vent dependent and PEG. Has PICC line for TPN. Discharged from [place] with home health a week pror. Brought in with low grade fever, increased secretions and difficulty flushing ports. Chronic opacities and ileus on imaging remains unchanged. Cultures 12/19 NGTD.  eICU Interventions  No changes in vent settings PEG changed 12/19 PICC line removed 12/21 [facility] following     Intervention Category Evaluation Type: New Patient Evaluation  Judd Lien 06/24/2022, 1:00 AM

## 2022-06-24 NOTE — Progress Notes (Signed)
PHARMACY - TOTAL PARENTERAL NUTRITION CONSULT NOTE   Indication: chronic ileus in the setting of ALS   Patient Measurements: Height: '5\' 5"'$  (165.1 cm) Weight: 57.3 kg (126 lb 5.2 oz) IBW/kg (Calculated) : 61.5 TPN AdjBW (KG): 57.3 Body mass index is 21.02 kg/m. Usual Weight: 120- 130 lbs  Assessment:  51 yo man on TPN through [facility] prior to admission for chronic ileus related to ALS, now admitted with ileus. Patient also has a PEG tube that is used for medication administration. Pharmacy consulted to resume TPN.  Glucose / Insulin: BG < 120 off home TPN, dextrose 50g given, no hx DM, A1C 5.4, no SSI used  Electrolytes: K 3.6, Cl 112, CO2 19, Phos 2, Mg 1.8, CoCa 9.6, others wnl Renal: Scr 0.33, BUN 9 Hepatic: ALT 83 down, Albumin 3.1, TG 24 Intake / Output; MIVF: 1.2 ml/kg/hr, BM x2 12/20 GI Imaging: 12/19 KUB: chronic ileus gas pattern GI Surgeries / Procedures: none  Central access: implanted port 07/06/19  TPN start date: PTA   Nutritional Goals: Goal TPN rate is 85 mL/hr (provides 102 g of protein and 1847 kcals per day)  RD Assessment: pending    Home TPN formula (Added to media files): -2000 ml over 24 hours, rate of 83.3 ml/hr -100 g protein, 250 g dextrose, 56 g Smof lipids, 1809 Kcal -NaCl 10 mEq, NaAcet 60 mEq, Kcl 16 mEq, Kphos 30 mmol, Mg 10 mEq, CaGluc 9 mEq MVI, Trace elements   Current Nutrition:  NPO  Plan:  Resume home TPN at 85 mL/hr at 1800, provides 102 g protein, 1847 kcals, meeting 100% of estimated needs Electrolytes in TPN: Na 35 mEq/L, K 30 mEq/L, Ca 4.5 mEq/L, Mg 5 mEq/L, and Phos 15 mmol/L. Cl:Ac Max acetate Add standard MVI and trace elements to TPN Decrease to Sensitive q6h SSI and adjust as needed   Monitor TPN labs daily until stable at goal then on Mon/Thurs   Benetta Spar, PharmD, BCPS, [place] Pharmacist  Please check AMION for all [place] phone numbers After 10:00 PM, call [place]

## 2022-06-25 DIAGNOSIS — G1221 Amyotrophic lateral sclerosis: Secondary | ICD-10-CM | POA: Diagnosis not present

## 2022-06-25 DIAGNOSIS — G909 Disorder of the autonomic nervous system, unspecified: Secondary | ICD-10-CM | POA: Diagnosis not present

## 2022-06-25 DIAGNOSIS — K567 Ileus, unspecified: Secondary | ICD-10-CM | POA: Diagnosis not present

## 2022-06-25 DIAGNOSIS — Z930 Tracheostomy status: Secondary | ICD-10-CM

## 2022-06-25 DIAGNOSIS — J9611 Chronic respiratory failure with hypoxia: Secondary | ICD-10-CM | POA: Diagnosis not present

## 2022-06-25 DIAGNOSIS — J151 Pneumonia due to Pseudomonas: Secondary | ICD-10-CM

## 2022-06-25 DIAGNOSIS — Z9911 Dependence on respirator [ventilator] status: Secondary | ICD-10-CM | POA: Diagnosis not present

## 2022-06-25 DIAGNOSIS — J9612 Chronic respiratory failure with hypercapnia: Secondary | ICD-10-CM | POA: Diagnosis not present

## 2022-06-25 LAB — BASIC METABOLIC PANEL
Anion gap: 10 (ref 5–15)
BUN: 10 mg/dL (ref 6–20)
CO2: 21 mmol/L — ABNORMAL LOW (ref 22–32)
Calcium: 9.1 mg/dL (ref 8.9–10.3)
Chloride: 109 mmol/L (ref 98–111)
Creatinine, Ser: <0.3 mg/dL — ABNORMAL LOW (ref 0.61–1.24)
Glucose, Bld: 177 mg/dL — ABNORMAL HIGH (ref 70–99)
Potassium: 3.3 mmol/L — ABNORMAL LOW (ref 3.5–5.1)
Sodium: 140 mmol/L (ref 135–145)

## 2022-06-25 LAB — GLUCOSE, CAPILLARY
Glucose-Capillary: 152 mg/dL — ABNORMAL HIGH (ref 70–99)
Glucose-Capillary: 146 mg/dL — ABNORMAL HIGH (ref 70–99)

## 2022-06-25 LAB — CBC WITH DIFFERENTIAL/PLATELET
Abs Immature Granulocytes: 0.04 10*3/uL (ref 0.00–0.07)
Basophils Absolute: 0 10*3/uL (ref 0.0–0.1)
Basophils Relative: 0 %
Eosinophils Absolute: 0.2 10*3/uL (ref 0.0–0.5)
Eosinophils Relative: 2 %
HCT: 28.4 % — ABNORMAL LOW (ref 39.0–52.0)
Hemoglobin: 8.9 g/dL — ABNORMAL LOW (ref 13.0–17.0)
Immature Granulocytes: 1 %
Lymphocytes Relative: 11 %
Lymphs Abs: 0.9 10*3/uL (ref 0.7–4.0)
MCH: 27.2 pg (ref 26.0–34.0)
MCHC: 31.3 g/dL (ref 30.0–36.0)
MCV: 86.9 fL (ref 80.0–100.0)
Monocytes Absolute: 0.4 10*3/uL (ref 0.1–1.0)
Monocytes Relative: 5 %
Neutro Abs: 6.5 10*3/uL (ref 1.7–7.7)
Neutrophils Relative %: 81 %
Platelets: 141 10*3/uL — ABNORMAL LOW (ref 150–400)
RBC: 3.27 MIL/uL — ABNORMAL LOW (ref 4.22–5.81)
RDW: 18.3 % — ABNORMAL HIGH (ref 11.5–15.5)
WBC: 8 10*3/uL (ref 4.0–10.5)
nRBC: 0 % (ref 0.0–0.2)

## 2022-06-25 LAB — MAGNESIUM
Magnesium: 1.7 mg/dL (ref 1.7–2.4)
Magnesium: 2.2 mg/dL (ref 1.7–2.4)

## 2022-06-25 LAB — PHOSPHORUS
Phosphorus: 2.8 mg/dL (ref 2.5–4.6)
Phosphorus: 2.6 mg/dL (ref 2.5–4.6)

## 2022-06-25 LAB — POTASSIUM: Potassium: 3.6 mmol/L (ref 3.5–5.1)

## 2022-06-25 MED ORDER — POTASSIUM CHLORIDE 10 MEQ/50ML IV SOLN
10.0000 meq | INTRAVENOUS | Status: DC
  Administered 2022-06-25: 10 meq via INTRAVENOUS
  Filled 2022-06-25: qty 50

## 2022-06-25 MED ORDER — POTASSIUM CHLORIDE 10 MEQ/50ML IV SOLN
10.0000 meq | INTRAVENOUS | Status: AC
  Administered 2022-06-25 (×5): 10 meq via INTRAVENOUS
  Filled 2022-06-25 (×5): qty 50

## 2022-06-25 MED ORDER — MAGNESIUM SULFATE 2 GM/50ML IV SOLN
2.0000 g | Freq: Once | INTRAVENOUS | Status: AC
  Administered 2022-06-25: 2 g via INTRAVENOUS
  Filled 2022-06-25: qty 50

## 2022-06-25 MED ORDER — POTASSIUM CHLORIDE 10 MEQ/50ML IV SOLN: 10.0000 meq | INTRAVENOUS | Status: DC

## 2022-06-25 MED ORDER — TRAVASOL 10 % IV SOLN: INTRAVENOUS | Status: DC

## 2022-06-25 MED ORDER — SODIUM CHLORIDE 0.9 % IV SOLN
2.0000 g | Freq: Three times a day (TID) | INTRAVENOUS | Status: AC
  Administered 2022-06-25 – 2022-07-01 (×20): 2 g via INTRAVENOUS
  Filled 2022-06-25 (×19): qty 12.5

## 2022-06-25 MED ORDER — TRAVASOL 10 % IV SOLN
INTRAVENOUS | Status: AC
  Filled 2022-06-25: qty 1020

## 2022-06-25 NOTE — Progress Notes (Addendum)
NAME:  Janari Yamada, MRN:  [id number], DOB:  1971-05-27, LOS: 1 ADMISSION DATE:  06/22/2022, CONSULTATION DATE:  06/22/22 REFERRING MD:  edp, CHIEF COMPLAINT:  fever   History of Present Illness:  51 yo male with pmh ALSand secondary chronic hypoxic resp failure. He presented to ED today after being home for 7 days with wife (primary care giver) after lengthy stay in [facility]. She was having difficulty detaching the tpn tubing and called home health RN who came and documented a fever and distended abdomen with history of ileus. No other symptoms noted at home and per wife has been stable since discharge from facility.   Upon arrival to ED appears that the peg tube was unusable and ileus was indeed present on imaging. He was documented again to have fever and also complaining of leak around his trach despite getting adequate tidal volume and oxygen sat being 100% on fio2 28% as well as appearing comfortable.   At this time is seems caregiver would prefer observation in hosptial with fever and to ensure resolution of ileus (if possible) as she gives his medication via peg tube. I tend to agree observation for following of pan cx would be prudent in this patient who has an increased risk of decompensation. Continue to discuss palliative/hospice options with pt and family as his long term prognosis is poor esp in setting of inability to have enteral  nutrition now.    Pertinent  Medical History  Als, advanced Chronic hypoxic resp failure Chronic ileus  Significant Hospital Events: Including procedures, antibiotic start and stop dates in addition to other pertinent events   12/19 admit for fever  Interim History / Subjective:  No acute events overnight remains on home vent settings Sputum culture growing pseudomonas.   Objective   Blood pressure 121/84, pulse 67, temperature 98.5 F (36.9 C), temperature source Oral, resp. rate 17, height '5\' 5"'$  (1.651 m), weight 55.8 kg, SpO2 98 %.    Vent  Mode: PRVC FiO2 (%):  [30 %] 30 % Set Rate:  [16 bmp] 16 bmp Vt Set:  [500 mL] 500 mL PEEP:  [5 cmH20] 5 cmH20 Plateau Pressure:  [19 cmH20-21 cmH20] 19 cmH20   Intake/Output Summary (Last 24 hours) at 06/25/2022 0932 Last data filed at 06/25/2022 0800 Gross per 24 hour  Intake 3375.15 ml  Output 1225 ml  Net 2150.15 ml   Filed Weights   06/23/22 2333 06/24/22 0500 06/25/22 0500  Weight: 57.3 kg 57.3 kg 55.8 kg    Examination: General: awake and alert, communicates via computer.  HENT: NCAT, PERRL Lungs: Bilateral rhonchi Cardiovascular: rrr no MRG Abdomen: PEG, mild distension, non-tender.  Extremities: muscle wasting. No acute deforimty Neuro: Awake, alert, oriented. Able to move eyes and attempts to mouth words.   Resolved Hospital Problem list     Assessment & Plan:   Chronic hypoxemic respiratory failure secondary to ALS Pseudomonas pneumonia vs colonization. Multiple respiratory cultures just this year growing the same.  - Continue full vent support, home settings - VAP bundle - ABX per primary, low threshold to DC - Trach care per RT protocol  Tracheostomy status Cuff consistently losing air, will ask RT to perform trach change today.   Other management per primary service. - chronic ileus: TPN - protein calorie malnutrition TPN - Anemia   Best Practice (right click and "Reselect all SmartList Selections" daily)   Diet/type: NPO DVT prophylaxis: other GI prophylaxis: N/A Lines: yes and it is still needed Foley:  N/A  Code Status:  full code Last date of multidisciplinary goals of care discussion [per primary]  Labs   CBC: Recent Labs  Lab 06/22/22 0513 06/22/22 0532 06/23/22 0600 06/24/22 0417 06/25/22 0409  WBC 8.4  --  9.6 8.2 8.0  NEUTROABS 6.5  --   --  6.6 6.5  HGB 10.7* 11.6* 10.4* 9.4* 8.9*  HCT 34.3* 34.0* 33.7* 29.2* 28.4*  MCV 84.5  --  86.2 85.6 86.9  PLT 180  --  148* 153 141*     Basic Metabolic Panel: Recent Labs  Lab  06/22/22 0513 06/22/22 0532 06/23/22 0600 06/24/22 0417 06/25/22 0409  NA 137 138 142 139 140  K 3.4* 4.8 3.2* 3.6 3.3*  CL 105 105 109 112* 109  CO2 19*  --  19* 19* 21*  GLUCOSE 82 82 51* 97 177*  BUN 15 21* '15 9 10  '$ CREATININE <0.30* <0.20* 0.41* 0.33* <0.30*  CALCIUM 9.9  --  9.4 8.9 9.1  MG  --   --  2.1 1.8 1.7  PHOS  --   --  3.5 2.0* 2.8    GFR: CrCl cannot be calculated (This lab value cannot be used to calculate CrCl because it is not a number: <0.30). Recent Labs  Lab 06/22/22 0513 06/23/22 0600 06/24/22 0417 06/25/22 0409  WBC 8.4 9.6 8.2 8.0  LATICACIDVEN 0.9  --   --   --      Liver Function Tests: Recent Labs  Lab 06/22/22 0513 06/23/22 0600 06/24/22 0417  AST 39 78* 36  ALT 66* 133* 83*  ALKPHOS 133* 144* 119  BILITOT 0.9 1.2 1.1  PROT 8.3* 8.3* 7.1  ALBUMIN 3.6 3.5 3.1*    No results for input(s): "LIPASE", "AMYLASE" in the last 168 hours. No results for input(s): "AMMONIA" in the last 168 hours.  ABG    Component Value Date/Time   PHART 7.52 (H) 01/09/2022 1810   PCO2ART 26 (L) 01/09/2022 1810   PO2ART 66 (L) 01/09/2022 1810   HCO3 21.2 01/09/2022 1810   TCO2 23 06/22/2022 0532   ACIDBASEDEF 0.3 01/09/2022 1810   O2SAT 93.4 01/09/2022 1810     Coagulation Profile: Recent Labs  Lab 06/22/22 0513  INR 1.2     Cardiac Enzymes: No results for input(s): "CKTOTAL", "CKMB", "CKMBINDEX", "TROPONINI" in the last 168 hours.  HbA1C: Hgb A1c MFr Bld  Date/Time Value Ref Range Status  12/09/2021 11:05 AM 5.4 4.8 - 5.6 % Final    Comment:    (NOTE) Pre diabetes:          5.7%-6.4%  Diabetes:              >6.4%  Glycemic control for   <7.0% adults with diabetes   10/03/2018 10:37 AM 6.0 (H) <5.7 % of total Hgb Final    Comment:    For someone without known diabetes, a hemoglobin  A1c value between 5.7% and 6.4% is consistent with prediabetes and should be confirmed with a  follow-up test. . For someone with known diabetes,  a value <7% indicates that their diabetes is well controlled. A1c targets should be individualized based on duration of diabetes, age, comorbid conditions, and other considerations. . This assay result is consistent with an increased risk of diabetes. . Currently, no consensus exists regarding use of hemoglobin A1c for diagnosis of diabetes for children. .     CBG: Recent Labs  Lab 06/24/22 1217 06/24/22 1547 06/24/22 1755 06/24/22 2318 06/25/22 0602  GLUCAP  106* 101* 97 125* 152*    Review of Systems:   As per HPI  Past Medical History:  He,  has a past medical history of ALS (amyotrophic lateral sclerosis) ([place]), COVID-19 virus infection (06/2020), Meningitis (2003), Morbid obesity ([place]), and Prediabetes (11/05/2016).   Surgical History:   Past Surgical History:  Procedure Laterality Date   IR REPLACE G-TUBE SIMPLE WO FLUORO  06/22/2022   MASS EXCISION Right 11/26/2014   Procedure: EXCISION MASS/GROIN EXCISION MASS;  Surgeon: Robert Bellow, MD;  Location: [facility] ORS;  Service: General;  Laterality: Right;   MASS EXCISION Right 11/26/2014   Procedure: MINOR EXCISION OF MASS/POST THIGH MASS;  Surgeon: Robert Bellow, MD;  Location: [facility] ORS;  Service: General;  Laterality: Right;   PEG PLACEMENT  10/27/2021   PORTACATH PLACEMENT Right 06/19/2019     Social History:   reports that he has never smoked. He has never used smokeless tobacco. He reports that he does not drink alcohol and does not use drugs.   Family History:  His family history includes Diabetes in his brother, father, mother, sister, sister, and sister; Fibromyalgia in his sister; Hypertension in his sister.   Allergies Allergies  Allergen Reactions   Crab [Shellfish Allergy] Rash     Home Medications  Prior to Admission medications   Medication Sig Start Date End Date Taking? Authorizing Provider  Baclofen 5 MG TABS Place 5 mg into feeding tube 3 (three) times daily as needed for muscle  spasms. 01/08/22   Dessa Phi, DO  bisacodyl (DULCOLAX) 10 MG suppository Place 10 mg rectally daily as needed for severe constipation. 09/18/21   [provider]  enoxaparin (LOVENOX) 40 MG/0.4ML injection Inject 0.4 mLs (40 mg total) into the skin daily. DVT prophylaxis 01/08/22   Dessa Phi, DO  hydrOXYzine (ATARAX) 25 MG tablet Place 1 tablet (25 mg total) into feeding tube 2 (two) times daily as needed for anxiety. 01/08/22   Dessa Phi, DO  hydrOXYzine (ATARAX) 50 MG tablet Place 1 tablet (50 mg total) into feeding tube at bedtime. 01/08/22   Dessa Phi, DO  melatonin 5 MG TABS Place 1 tablet (5 mg total) into feeding tube at bedtime. 01/08/22   Dessa Phi, DO  Nutritional Supplements (FEEDING SUPPLEMENT, KATE FARMS STANDARD 1.4,) LIQD liquid Place 65 mL/hr into feeding tube every 8 (eight) hours. 01/08/22   Dessa Phi, DO  pantoprazole sodium (PROTONIX) 40 mg Place 40 mg into feeding tube daily. 01/08/22   Dessa Phi, DO  polyethylene glycol powder (GLYCOLAX/MIRALAX) 17 GM/SCOOP powder Place 17 g into feeding tube daily as needed for constipation. 07/30/21   [provider]  scopolamine (TRANSDERM-SCOP) 1 MG/3DAYS Place 1 patch (1.5 mg total) onto the skin every 3 (three) days. 01/11/22   Dessa Phi, DO  Sennosides (SENNA) 8.8 MG/5ML LIQD Take 10 mLs by mouth daily as needed for constipation. 07/30/21   [provider]  tobramycin, PF, (TOBI) 300 MG/5ML nebulizer solution Take 5 mLs (300 mg total) by nebulization 2 (two) times daily. 01/08/22   Dessa Phi, DO     Critical care time: NA     Georgann Housekeeper, AGACNP-BC [facility]  See Amion for personal pager PCCM on call pager [phone] until 7pm. Please call Elink 7p-7a. [phone]  06/25/2022 9:54 AM

## 2022-06-25 NOTE — Consult Note (Signed)
Palliative Care Consult Note                                  Date: 06/25/2022   Patient Name: Jerry [name]  DOB: 03-10-1971  MRN: [id number]  Age / Sex: 51 y.o., male  PCP: [name], MD Referring Physician: Allie Bossier, MD  Reason for Consultation: Establishing goals of care  HPI/Patient Profile: 51 y.o. male  with past medical history of advanced ALS, chronic respiratory failure and ventilator dependence, and chronic ileus who presented to the ED on 06/22/2022 with fever and distended abdomen. He is discharged home 7 days ago after a lengthy stay at [facility].  In the ED, imaging showed chronic ileus gas pattern.  He is on TPN and has a PEG tube that is used for medication administration.  PEG tube was found to be unusable and replaced on 12/19. Tracheostomy was changed on 12/22. Patient was found to have pseudomonas pneumonia.  Palliative Medicine has been consulted for goals of care in the setting of advanced ALS ad ventilator dependence.  Past Medical History:  Diagnosis Date   ALS (amyotrophic lateral sclerosis) ([place])    COVID-19 virus infection 06/2020   Meningitis 2003   spinal   Morbid obesity ([place])    Prediabetes 11/05/2016   A1C 5.7 on 11/05/16    Subjective:   I have reviewed medical records including progress notes, labs and imaging, discussed with Dr. Sherral Hammers, and assessed the patient at bedside. He communicates with a computer device.   I met with Sebastain and his wife Fredda Hammed at bedside to discuss diagnosis, prognosis, GOC, disposition, and options.  Patient is known to PMT from his hospitalization in June 2023. I re-introduced Palliative Medicine as specialized medical care for people living with serious illness. It focuses on providing relief from the symptoms and stress of a serious illness.   A brief life review was discussed. [place] and Fredda Hammed have been married for 24 years. They have 2 sons (ages 86 and 15) and 1  daughter (age 34). 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   Chanceler was diagnosed with ALS in 2020. He was hospitalized at [facility] 6/7 through 7/7 with respiratory arrest and has been vent dependent since then. He was then hospitalized at [facility] from 7/7 through 12/12 (131 days).   He was discharged home 12/12 on the ventilator and with TPN. Fredda Hammed shares it was a rough 6 days. She expresses concern she was not provided with adequate training and discharge instructions from [facility]. She also expresses some frustration with home health care.  We discussed patient's current illness and what it means in the larger context of his ongoing co-morbidities. Current clinical status was reviewed. Discussed he was being treated for pseudomonas pneumonia. He also has chronic ileus (secondary to ALS), and this may not improve.   Natural disease trajectory of ALS was discussed. Outside the room, Fredda Hammed shares that she and Harvin understand the progressive nature of ALS. However for now, they remain open to all offered and available medical interventions to prolong life. [name] remains devoted to caring for Randle at home, and providing him with with best quality of life possible given the circumstances.   We reviewed the MOST form on file from 2021. It specifies full code and full scope of treatment. Fredda Hammed confirms this remains consistent with Alois's wishes.   Questions and concerns addressed.  I provided Fredda Hammed was PMT contact info and encouraged her to call with questions or concerns.     Review of Systems  All other systems reviewed and are negative.   Objective:   Primary Diagnoses: Present on Admission:  Fever  Ileus (HCC)  ALS (amyotrophic lateral sclerosis) (HCC)  Chronic respiratory failure with hypoxia (HCC)  Autonomic dysfunction   Physical Exam Vitals reviewed.  Constitutional:      General: He is not in acute distress.    Comments: Chronically  ill-appearing  Pulmonary:     Effort: No respiratory distress.     Comments: Trach/vent Neurological:     Mental Status: He is alert and oriented to person, place, and time.  Psychiatric:        Behavior: Behavior normal.    Palliative Assessment/Data: PPS 30%     Assessment & Plan:   SUMMARY OF RECOMMENDATIONS   Full code Full scope interventions Goal of care is medical stabilization and to return home  Recommend outpatient palliative at discharge PMT will continue to follow  Primary Decision Maker: PATIENT with support from his wife  Prognosis:  Unable to determine    Thank you for allowing [place] to participate in the care of [name]  MDM - High   Signed by: Elie Confer, NP Palliative Medicine Team  Team Phone # [phone]  For individual providers, please see AMION

## 2022-06-25 NOTE — Progress Notes (Signed)
PHARMACY - TOTAL PARENTERAL NUTRITION CONSULT NOTE   Indication: chronic ileus in the setting of ALS   Patient Measurements: Height: '5\' 5"'$  (165.1 cm) Weight: 55.8 kg (123 lb 0.3 oz) IBW/kg (Calculated) : 61.5 TPN AdjBW (KG): 57.3 Body mass index is 20.47 kg/m. Usual Weight: 120- 130 lbs  Assessment:  51 yo man on TPN through [facility] prior to admission for chronic ileus related to ALS, now admitted with ileus. Patient also has a PEG tube that is used for medication administration. Pharmacy consulted to resume TPN.  Glucose / Insulin: BG < 180, no hx DM, A1C 5.4, used 3 units SSI last 24 hrs Electrolytes: K 3.3 (ordered 40 mEq IV, goal >/=4),  CO2 21 up, Mg 1.7 (goal >/=2), CoCa 9.8, others wnl Renal: Scr < 0.3, BUN wnl Hepatic: ALT 83 down, Albumin 3.1, TG 24 Intake / Output; MIVF: off D5halfNS, UOP 0.9 ml/kg/hr, BM x1 12/21 GI Imaging: 12/19 KUB: chronic ileus gas pattern GI Surgeries / Procedures: none  Central access: implanted port 07/06/19  TPN start date: PTA  Nutritional Goals: Goal TPN rate is 85 mL/hr (provides 102 g of protein and 1847 kcals per day)  RD Assessment: pending Estimated Needs Total Energy Estimated Needs: 1800-1900 Total Protein Estimated Needs: 95-105 grams Total Fluid Estimated Needs: >1.8 L/day  Home TPN formula (Added to media files): -2000 ml over 24 hours, rate of 83.3 ml/hr -100 g protein, 250 g dextrose, 56 g Smof lipids, 1809 Kcal -NaCl 10 mEq, NaAcet 60 mEq, Kcl 16 mEq, Kphos 30 mmol, Mg 10 mEq, CaGluc 9 mEq MVI, Trace elements   Current Nutrition:  NPO + TPN  Plan:  Continue goal TPN at 85 mL/hr at 1800, provides 102 g protein, 1847 kcals, meeting 100% of estimated needs Electrolytes in TPN: Increase  K 40 mEq/L, Mg 8 mEq/L, Cl:Ac 1:2; Decrease slightly Ca 4 mEq/L; Na 35 mEq/L, and Phos 15 mmol/L  Give Kcl IV 40 mEq Give Mg 2g IV  Add standard MVI and trace elements to TPN Stop Sensitive q6h SSI and BG checks Monitor TPN labs daily  until stable at goal then on Mon/Thurs  Benetta Spar, PharmD, BCPS, [facility] Clinical Pharmacist  Please check AMION for all [place] phone numbers After 10:00 PM, call [place] [phone]

## 2022-06-25 NOTE — Progress Notes (Signed)
     Consult received. Chart reviewed.  I spoke with patient's wife Fredda Hammed by phone in efforts to arrange a meeting.  We initially planned to meet this afternoon, but she just called back and said she is still working and asked to meet tomorrow instead.  Plan is for [place] meeting tomorrow at 10 am.    Elie Confer, NP-C Palliative Medicine   Please call Palliative Medicine team phone with any questions [phone]. For individual providers please see AMION.   No charge

## 2022-06-25 NOTE — Procedures (Signed)
Tracheostomy Change Note  Patient Details:   Name: Jerry [name] DOB: Apr 14, 1971 MRN: [id number]    Airway Documentation:     Evaluation  O2 sats: stable throughout Complications: No apparent complications Patient did tolerate procedure well. Bilateral Breath Sounds: Rhonchi, Diminished    Esperanza Sheets T 06/25/2022, 10:07 AM

## 2022-06-25 NOTE — Progress Notes (Signed)
Jerry [name] NUU:[id number] DOB: 30-Sep-1970 DOA: 06/22/2022 PCP: [name], MD   Subj:  51 y.o. BM PMHx  ALS with chronic tracheostomy/ventilator-dependent hypoxic respiratory failure, autonomic dysfunction, and ileus on TPN   Presents to the emergency department with fever.   Patient was discharged home from [facility] 1 week ago and is being cared for primarily by his wife with home health RN.  His wife was having difficulty detaching TPN tubing from his PICC and called home health RN for assistance.  Home health RN reports a fever of 100.5 F and abdominal distention.  He was sent to the ED for evaluation of this.     ED Course: Upon arrival to the ED, patient is found to be afebrile and saturating well on his usual vent settings with systolic blood pressure of 91 and greater.  EKG demonstrates sinus rhythm, chest x-ray notable for some chronic findings but no acute cardiopulmonary disease, and KUB with no significant change in ileus or G-tube placement.  Labs notable for potassium 3.4, bicarbonate 19, alkaline phosphatase 133, AST 66, hemoglobin 10.7, lactic acid 0.9, and negative COVID/influenza/RSV PCR.   PCCM was consulted by the ED physician, evaluated the patient in the emergency department, and recommends observation on the hospitalist service given patient's increased risk of decompensation.    Obj: 12/22 afebrile overnight.  A/O x 4.  Trach changed today to a #8 cuffed, secondary to plugging.   Objective: VITAL SIGNS: Temp: 98.5 F (36.9 C) (12/22 0744) Temp Source: Oral (12/22 0744) BP: 138/85 (12/22 0600) Pulse Rate: 83 (12/22 0600)   Vent settings 12/22 Mode PRVC Vt Set 500 ml Set rate 16 FiO2 30% PEEP 5 SpO2 97%    Intake/Output Summary (Last 24 hours) at 06/25/2022 0753 Last data filed at 06/25/2022 0600 Gross per 24 hour  Intake 3178.13 ml  Output 1225 ml  Net 1953.13 ml    Physical Exam:  General: A/O x 4 positive acute on chronic respiratory  distress,(communicates through eye tracking computer), Eyes: negative scleral hemorrhage, negative anisocoria, negative icterus ENT: Negative Runny nose, negative gingival bleeding, Neck:  Negative scars, masses, torticollis, lymphadenopathy, JVD Lungs: Clear to auscultation on the right with positive rhonchi LLL, positive thick yellow sticky sputum suctioned from trach.  Cardiovascular: Regular rate and rhythm without murmur gallop or rub normal S1 and S2 Abdomen: negative abdominal pain, nondistended, positive soft, bowel sounds, no rebound, no ascites, no appreciable mass Extremities: No significant cyanosis, clubbing, or edema bilateral lower extremities Skin: Negative rashes, lesions, ulcers Psychiatric:  Negative depression, negative anxiety, negative fatigue, negative mania  Central nervous system:  Cranial nerves II through XII intact, tongue/uvula midline, all extremities muscle strength 0 /5, sensation intact throughout,    Physical Exam:    Mobility Assessment (last 72 hours)     Mobility Assessment   No documentation.             DVT prophylaxis: Lovenox Code Status: Full Family Communication: 12/22 attempted to call wife (Ms. Carlota Raspberry) and updated her on plan of care and lab results.   Status is: Inpatient    Dispo: The patient is from: Home              Anticipated d/c is to: Home              Anticipated d/c date is: 3 days              Patient currently is not medically stable to d/c.    Procedures/Significant Events:  On admission: Right 3 lumen PICC line On admission RIGHT chest wall Port-A-Cath placed 2021 [facility].  Per RN Bobbye [place] White ports nonfunctional 12/19 PEG tube replaced new 24 Fr Enfit hub-type gastrostomy tube 12/21 right arm PICC line removed and tip sent to culture..   Consultants:  PCCM   Cultures 12/19 sputum Gram positive Pseudomonas aeruginosa susceptibilities pending:  12/19 blood LEFT AC x 2 NGTD 12/20 urine positive  multiple species 12/20 respiratory virus panel negative    Antimicrobials: Anti-infectives (From admission, onward)    Start     Ordered Stop   06/24/22 1400  piperacillin-tazobactam (ZOSYN) IVPB 3.375 g        06/24/22 1049     06/24/22 1145  doxycycline (VIBRAMYCIN) 100 mg in sodium chloride 0.9 % 250 mL IVPB        06/24/22 1049           Assessment & Plan: Covid vaccination;   Principal Problem:   Fever Active Problems:   ALS (amyotrophic lateral sclerosis) (HCC)   Autonomic dysfunction   Ventilator dependent ([place])   Tracheostomy dependent (HCC)   Ileus (HCC)   Chronic respiratory failure with hypoxia (HCC)    Fever  - Presents with fever 100.5 F at home  - Afebrile here with normal lactate, no SIRS criteria or evidence for acute infection on ED workup, COVID/Flu/RSV pcr negative   - Sputum culture collected in ED, will also culture blood and urine, follow clinical course - 12/19 PEG tube changed -12/20 consult IV team to remove partially functioning PICC line will send for culture. -12/20 respiratory virus panel pending -12/20 PCXR pending -12/20 stain positive for gram-positive cocci in pairs, will hold antibiotics at this time negative leukocytosis, negative fever, but given patient's condition short fused for starting empiric antibiotics. - 12/21 sputum positive gram-negative rods (Haemophilus influenzae, Bordetella pertussis, Legionella pneumophila, and Acinetobacter baumannii?) -12/21 discussed case with pharmacy they concur that best to empirically start patient on antibiotics given his immunocompromise state.   Chronic hypoxic respiratory failure  - Stable on home vent settings, appreciate PCCM involvement    -12/20 stable on vent settings.  See vent settings above -12/22 trach change #8 cuffed - 12/22 stable on current vent settings however patient has thick yellow sticky sputum C/W Pseudomonas.  Will consider starting hypertonic saline nebulizer if  patient continues to have significant mucus plugging.   ALS  - Continue trach and PEG care, consult palliative care    Chronic ileus  -12/19 IR replaced PEG -12/20 per wife chronic has been there since patient was in [facility], prior to going home.  Receives no food/drink except for medication through PEG. -12/20 per wife prior to discharge [facility] no training given for accessing PEG tube to place on intermittent suction. -12/21 RN Sarah to give instructions to wife when she comes to hospital today on how to look up patient's PEG tube to home suction, in order to provide intermittent suction when required.   Autonomic dysfunction  - Normal HR and BP in ED, continue to monitor    Hypoglycemia - 12/20 D50 1 amp - 12/20 D5-0.45% saline 33m/hr -12/21 DC D5-0.45% saline -12/21 normal saline 767mhr   Hypokalemia - Potassium goal>4 - 12/20 Potassium IV 60 mEq -12/22 Potassium IV 60 mEq -12/22 recheck K/Mg/PO4'@1400'$   Hypophosphatemia - Phosphorus goal> 2.5 - 12/21 slightly low however patient started TPN today we will see if it corrects overnight.  Pressure injury stage I Pressure Injury 12/26/21 Ear Right  Stage 1 -  Intact skin with non-blanchable redness of a localized area usually over a bony prominence. (Active)  12/26/21 2000  Location: Ear  Location Orientation: Right  Staging: Stage 1 -  Intact skin with non-blanchable redness of a localized area usually over a bony prominence.  Wound Description (Comments):   Present on Admission:    Anemia unspecified -12/21 most likely anemia of chronic disease however will verify. - 12/21 anemia panel pending - 12/21 occult blood pending Lab Results  Component Value Date   HGB 8.9 (L) 06/25/2022   HGB 9.4 (L) 06/24/2022   HGB 10.4 (L) 06/23/2022   HGB 11.6 (L) 06/22/2022   HGB 10.7 (L) 06/22/2022  -Relatively stable.  Protein calorie malnutrition - 12/20 patient did not receive his TPN today, unknown reason. -12/20  spoke with [place], TPN consult never placed, therefore patient did not receive his TPN.  Will start TPN on 12/21 -12/20 2x ports  PICC line nonfunctional.  Consult IV team for replacement of PICC line with 2 Lumens. -12/20 IV team order:Access patient's right chest wall port ensure that its functional.  Then remove patient's PICC line and send tip for lab for culture - 12/21 pharmacy restarting patient's home TPN -2000 ml over 24 hours, rate of 83.3 ml/hr -100 g protein, 250 g dextrose, 56 g Smof lipids, 1809 Kcal -NaCl 10 mEq, NaAcet 60 mEq, Kcl 16 mEq, Kphos 30 mmol, Mg 10 mEq, CaGluc 9 mEq MVI, Trace elements   Goals of care - 12/20 spoke with RN Freeman who understands complexity of patient's medical problems.  Will attempt to place patient either and 17M or 4NP, or another appropriate unit.       Mobility Assessment (last 72 hours)     Mobility Assessment   No documentation.           Time: 50 minutes         Care during the described time interval was provided by me .  I have reviewed this patient's available data, including medical history, events of note, physical examination, and all test results as part of my evaluation.

## 2022-06-25 NOTE — Progress Notes (Signed)
PHARMACY ANTIBIOTIC CONSULT NOTE   Jerry [name] a 51 y.o. male admitted on 07/02/22 with fever. PMH significant for ALS with chronic trach, autonomic dysfunction, and ileus on TPN. Pt appears to be chronically colonized with PsA in his trach and again grew pseudomonas aeruginosa that was resistant to cefepime. Patient has required trach exchange this admit as a result of mucous plugging, as such will transition him from Zosyn to cefepime.  Pharmacy has been consulted for cefepime dosing.  12/22: Scr <0.30, WBC 8.0. Calculated CrCl using an adjusted Scr of 0.8 ~87 mL/min.   Plan: STOP Zosyn  START Cefepime 2g IV Q8H Monitor renal function, clinical status, C/S, de-escalation   Allergies:  Allergies  Allergen Reactions   Crab [Shellfish Allergy] Rash    Filed Weights   06/23/22 2333 06/24/22 0500 06/25/22 0500  Weight: 57.3 kg (126 lb 5.2 oz) 57.3 kg (126 lb 5.2 oz) 55.8 kg (123 lb 0.3 oz)       Latest Ref Rng & Units 06/25/2022    4:09 AM 06/24/2022    4:17 AM 06/23/2022    6:00 AM  CBC  WBC 4.0 - 10.5 K/uL 8.0  8.2  9.6   Hemoglobin 13.0 - 17.0 g/dL 8.9  9.4  10.4   Hematocrit 39.0 - 52.0 % 28.4  29.2  33.7   Platelets 150 - 400 K/uL 141  153  148     Antibiotics Given (last 72 hours)     Date/Time Action Medication Dose Rate   06/24/22 1126 New Bag/Given   doxycycline (VIBRAMYCIN) 100 mg in sodium chloride 0.9 % 250 mL IVPB 100 mg 125 mL/hr   06/24/22 1332 New Bag/Given   piperacillin-tazobactam (ZOSYN) IVPB 3.375 g 3.375 g 12.5 mL/hr   06/24/22 2244 New Bag/Given   piperacillin-tazobactam (ZOSYN) IVPB 3.375 g 3.375 g 12.5 mL/hr   06/25/22 0033 New Bag/Given   doxycycline (VIBRAMYCIN) 100 mg in sodium chloride 0.9 % 250 mL IVPB 100 mg 125 mL/hr   06/25/22 0947 New Bag/Given   piperacillin-tazobactam (ZOSYN) IVPB 3.375 g 3.375 g 12.5 mL/hr   06/25/22 1345 New Bag/Given   piperacillin-tazobactam (ZOSYN) IVPB 3.375 g 3.375 g 12.5 mL/hr       Antimicrobials this  admission: 12/21 Zosyn>>12/22 12/22 Cefepime>>c  12/21 doxycycline>>12/22  Microbiology results: 12/19 Bcx: NGTD 12/21 Cath tip Cx: NGTD  12/21 MRSA PCR: not detected 12/20 Ucx: multiple spp 12/20 RVP: not detected  12/19 sputum cx: moderate pseudomonas aeruginosa (R-Zosyn)   Thank you for allowing pharmacy to be a part of this patient's care.  Adria Dill, PharmD PGY-2 Infectious Diseases Resident  06/25/2022 2:05 PM

## 2022-06-25 NOTE — TOC Initial Note (Addendum)
Transition of Care [facility]) - Initial/Assessment Note    Patient Details  Name: Jerry [name] MRN: [id number] Date of Birth: [birth year]  Transition of [facility] CM/SW Contact:    Erenest Rasher, RN Phone Number: [phone] 06/25/2022, 3:15 PM  Clinical Narrative:                 TOC CM spoke to pt and gave instructions to call wife, Kisma. Contacted wife and states when he was dc from [facility], his HH was not readily available after his dc. Pt [place] is arranged with [facility] and Alvis Lemmings is doing his Adult Nujrsing. Wife reports pt has hospital bed, power chair, hoyer lift, suction, and vent at home. She states she wants everything in place when he discharged.Will need HH orders and orders for TPN.   Expected Discharge Plan: [place] Barriers to Discharge: Continued Medical Work up   Patient Goals and CMS Choice Patient states their goals for this hospitalization and ongoing recovery are:: wants to return home CMS Medicare.gov Compare Post Acute Care list provided to:: Patient Represenative (must comment) Choice offered to / list presented to : Spouse      Expected Discharge Plan and Services   Discharge Planning Services: CM Consult Post Acute Care Choice: [place] arrangements for the past 2 months: [place]: RN [place] Agency: [place], [place] Date [place]: 06/25/22 Time [place]: 1514 Representative spoke with at [place]: [place] Arrangements/Services Living arrangements for the past 2 months: [place] with:: Spouse Patient language and need for interpreter reviewed:: Yes Do you feel safe going back to the place where you live?: Yes      Need for Family Participation in Patient Care: No (Comment) Care giver support system in place?: Yes (comment) Current home services: DME Criminal Activity/Legal Involvement  Pertinent to Current Situation/Hospitalization: No - Comment as needed  Activities of Daily Living   ADL Screening (condition at time of admission) Patient's cognitive ability adequate to safely complete daily activities?: Yes Is the patient deaf or have difficulty hearing?: No Does the patient have difficulty seeing, even when wearing glasses/contacts?: No Does the patient have difficulty concentrating, remembering, or making decisions?: No Patient able to express need for assistance with ADLs?: Yes Does the patient have difficulty dressing or bathing?: Yes Independently performs ADLs?: No Communication: Independent with device (comment) Dressing (OT): Dependent Is this a change from baseline?: Pre-admission baseline Grooming: Dependent Is this a change from baseline?: Pre-admission baseline Feeding: Dependent Is this a change from baseline?: Pre-admission baseline Bathing: Dependent Is this a change from baseline?: Pre-admission baseline Toileting: Dependent Is this a change from baseline?: Pre-admission baseline In/Out Bed: Dependent Is this a change from baseline?: Pre-admission baseline Walks in Home: Dependent Is this a change from baseline?: Pre-admission baseline Does the patient have difficulty walking or climbing stairs?: Yes Weakness of Legs: Both Weakness of Arms/Hands: Both  Permission Sought/Granted Permission sought to share information with : Case Manager, Family Supports, PCP Permission granted to share information with : Yes, Verbal Permission Granted  Share Information with NAME: Izeyah Deike  Permission granted to share info w AGENCY: [place] granted to share info w Relationship: wife  Permission granted to share info  w Contact Information: [phone]  Emotional Assessment Appearance:: Appears stated age   Affect (typically observed): Accepting Orientation: : Oriented to Self, Oriented to Place, Oriented to  Time, Oriented to  Situation   Psych Involvement: No (comment)  Admission diagnosis:  Ileus ([place]) [K56.7] Fever [R50.9] PEG (percutaneous endoscopic gastrostomy) adjustment/replacement/removal ([place]) [Z43.1] Occlusion of peripherally inserted central catheter (PICC) line, initial encounter ([place]) [T82.898A] Patient Active Problem List   Diagnosis Date Noted   Fever 06/22/2022   Chronic respiratory failure with hypoxia ([place]) 06/22/2022   Ileus ([place]) 05/19/2022   Abdominal bloating 05/19/2022   Acute on chronic respiratory failure with hypoxia ([place]) 01/11/2022   Pseudomonas pneumonia ([place]) 01/06/2022   Autonomic dysfunction 01/06/2022   Ventilator dependent ([place]) 01/06/2022   Tracheostomy dependent ([place]) 01/06/2022   Pressure injury of skin 12/27/2021   Cardiac arrest ([place])    Acute hypoxemic respiratory failure ([place])    Septic shock ([place]) 12/09/2021   Anxiety 11/18/2021   Bed sore on buttock, right, unstageable ([place]) 11/18/2021   Severe protein-calorie malnutrition ([place]) 11/13/2021   Microscopic hematuria 10/29/2021   S/P percutaneous endoscopic gastrostomy (PEG) tube placement ([place]) 10/29/2021   ALS (amyotrophic lateral sclerosis) ([place]) 01/11/2019   PCP:  [name], MD Pharmacy:   [facility] [place], [address] AT [address] [place] [address] Phone: [phone] Fax: [phone]     Social Determinants of Health (SDOH) Social History: SDOH Screenings   Food Insecurity: No Food Insecurity (01/21/2022)  Housing: Low Risk  (09/04/2020)  Transportation Needs: No Transportation Needs (01/21/2022)  Alcohol Screen: Low Risk  (09/04/2020)  Depression (PHQ2-9): Low Risk  (11/13/2021)  Financial Resource Strain: Low Risk  (09/04/2020)  Physical Activity: Inactive (09/04/2020)  Social Connections: Moderately Isolated (09/04/2020)  Stress: No Stress Concern Present (09/04/2020)  Tobacco Use: Low Risk  (06/22/2022)    SDOH Interventions:     Readmission Risk Interventions     No data to display

## 2022-06-26 DIAGNOSIS — Z515 Encounter for palliative care: Secondary | ICD-10-CM

## 2022-06-26 DIAGNOSIS — Z9911 Dependence on respirator [ventilator] status: Secondary | ICD-10-CM | POA: Diagnosis not present

## 2022-06-26 DIAGNOSIS — K567 Ileus, unspecified: Secondary | ICD-10-CM | POA: Diagnosis not present

## 2022-06-26 DIAGNOSIS — G909 Disorder of the autonomic nervous system, unspecified: Secondary | ICD-10-CM | POA: Diagnosis not present

## 2022-06-26 DIAGNOSIS — Z7189 Other specified counseling: Secondary | ICD-10-CM

## 2022-06-26 DIAGNOSIS — R509 Fever, unspecified: Secondary | ICD-10-CM | POA: Diagnosis not present

## 2022-06-26 DIAGNOSIS — G1221 Amyotrophic lateral sclerosis: Secondary | ICD-10-CM | POA: Diagnosis not present

## 2022-06-26 DIAGNOSIS — J9612 Chronic respiratory failure with hypercapnia: Secondary | ICD-10-CM

## 2022-06-26 DIAGNOSIS — J9611 Chronic respiratory failure with hypoxia: Secondary | ICD-10-CM | POA: Diagnosis not present

## 2022-06-26 LAB — MAGNESIUM: Magnesium: 1.8 mg/dL (ref 1.7–2.4)

## 2022-06-26 LAB — CBC WITH DIFFERENTIAL/PLATELET
Abs Immature Granulocytes: 0.02 10*3/uL (ref 0.00–0.07)
Basophils Absolute: 0 10*3/uL (ref 0.0–0.1)
Basophils Relative: 0 %
Eosinophils Absolute: 0.2 10*3/uL (ref 0.0–0.5)
Eosinophils Relative: 4 %
HCT: 27.1 % — ABNORMAL LOW (ref 39.0–52.0)
Hemoglobin: 8.4 g/dL — ABNORMAL LOW (ref 13.0–17.0)
Immature Granulocytes: 0 %
Lymphocytes Relative: 21 %
Lymphs Abs: 1.2 10*3/uL (ref 0.7–4.0)
MCH: 26.9 pg (ref 26.0–34.0)
MCHC: 31 g/dL (ref 30.0–36.0)
MCV: 86.9 fL (ref 80.0–100.0)
Monocytes Absolute: 0.4 10*3/uL (ref 0.1–1.0)
Monocytes Relative: 7 %
Neutro Abs: 3.9 10*3/uL (ref 1.7–7.7)
Neutrophils Relative %: 68 %
Platelets: 133 10*3/uL — ABNORMAL LOW (ref 150–400)
RBC: 3.12 MIL/uL — ABNORMAL LOW (ref 4.22–5.81)
RDW: 18.1 % — ABNORMAL HIGH (ref 11.5–15.5)
WBC: 5.7 10*3/uL (ref 4.0–10.5)
nRBC: 0 % (ref 0.0–0.2)

## 2022-06-26 LAB — COMPREHENSIVE METABOLIC PANEL
ALT: 47 U/L — ABNORMAL HIGH (ref 0–44)
AST: 20 U/L (ref 15–41)
Albumin: 2.8 g/dL — ABNORMAL LOW (ref 3.5–5.0)
Alkaline Phosphatase: 98 U/L (ref 38–126)
Anion gap: 4 — ABNORMAL LOW (ref 5–15)
BUN: 11 mg/dL (ref 6–20)
CO2: 23 mmol/L (ref 22–32)
Calcium: 8.8 mg/dL — ABNORMAL LOW (ref 8.9–10.3)
Chloride: 111 mmol/L (ref 98–111)
Creatinine, Ser: <0.3 mg/dL — ABNORMAL LOW (ref 0.61–1.24)
Glucose, Bld: 126 mg/dL — ABNORMAL HIGH (ref 70–99)
Potassium: 3.8 mmol/L (ref 3.5–5.1)
Sodium: 138 mmol/L (ref 135–145)
Total Bilirubin: 0.3 mg/dL (ref 0.3–1.2)
Total Protein: 6.7 g/dL (ref 6.5–8.1)

## 2022-06-26 LAB — PHOSPHORUS: Phosphorus: 2.2 mg/dL — ABNORMAL LOW (ref 2.5–4.6)

## 2022-06-26 MED ORDER — POTASSIUM PHOSPHATES 15 MMOLE/5ML IV SOLN: 15.0000 mmol | Freq: Once | INTRAVENOUS | Status: DC

## 2022-06-26 MED ORDER — TRAVASOL 10 % IV SOLN
INTRAVENOUS | Status: AC
  Filled 2022-06-26: qty 1020

## 2022-06-26 MED ORDER — POTASSIUM PHOSPHATES 15 MMOLE/5ML IV SOLN
20.0000 mmol | Freq: Once | INTRAVENOUS | Status: AC
  Administered 2022-06-26: 20 mmol via INTRAVENOUS
  Filled 2022-06-26: qty 6.67

## 2022-06-26 NOTE — Progress Notes (Signed)
PHARMACY - TOTAL PARENTERAL NUTRITION CONSULT NOTE   Indication: chronic ileus in the setting of ALS   Patient Measurements: Height: '5\' 5"'$  (165.1 cm) Weight: 57.6 kg (126 lb 15.8 oz) IBW/kg (Calculated) : 61.5 TPN AdjBW (KG): 57.3 Body mass index is 21.13 kg/m. Usual Weight: 120- 130 lbs  Assessment:  51 yo man on TPN through [facility] prior to admission for chronic ileus related to ALS, now admitted with ileus. Patient also has a PEG tube that is used for medication administration. Pharmacy consulted to resume TPN.  Glucose / Insulin: BG < 180, no hx DM, A1C 5.4, used 3 units SSI last 24 hrs Electrolytes: K 3.8 (received 80 mEq IV, goal >/=4), Mg 1.8 (received 2g, goal >/=2), phos 2.2 CoCa 9.8, others wnl Renal: Scr < 0.3, BUN wnl Hepatic: ALT 83 down, Albumin 3.1, TG 24 Intake / Output; MIVF:  UOP 1 ml/kg/hr, BM x1 12/22 GI Imaging: 12/19 KUB: chronic ileus gas pattern GI Surgeries / Procedures: none  Central access: implanted port 07/06/19  TPN start date: PTA  Nutritional Goals: Goal TPN rate is 85 mL/hr (provides 102 g of protein and 1847 kcals per day)  RD Assessment: pending Estimated Needs Total Energy Estimated Needs: 1800-1900 Total Protein Estimated Needs: 95-105 grams Total Fluid Estimated Needs: >1.8 L/day  Home TPN formula (Added to media files): -2000 ml over 24 hours, rate of 83.3 ml/hr -100 g protein, 250 g dextrose, 56 g Smof lipids, 1809 Kcal -NaCl 10 mEq, NaAcet 60 mEq, Kcl 16 mEq, Kphos 30 mmol, Mg 10 mEq, CaGluc 9 mEq MVI, Trace elements   Current Nutrition:  NPO + TPN  Plan:  Continue goal TPN at 85 mL/hr at 1800, provides 102 g protein, 1847 kcals, meeting 100% of estimated needs Electrolytes in TPN: Increase  Na 50 mEq/L, K 60 mEq/L, Mg 12 mEq/L  and Phos 20 mmol/L ; Continue Ca 4 mEq/L, Cl:Ac 1:2; Give Kphos 20 mmol (35 mEq of K) Add standard MVI and trace elements to TPN  Monitor TPN labs daily until stable at goal then on Mon/Thurs  Benetta Spar, PharmD, BCPS, [facility] Clinical Pharmacist  Please check AMION for all [place] phone numbers After 10:00 PM, call [place] [phone]

## 2022-06-26 NOTE — Progress Notes (Signed)
[name] HGD:[id number] DOB: [birth year] DOA: 06/22/2022 PCP: Jerry Sizer, MD   Subj:  51 y.o. BM PMHx  ALS with chronic tracheostomy/ventilator-dependent hypoxic respiratory failure, autonomic dysfunction, and ileus on TPN   Presents to the emergency department with fever.   Patient was discharged home from [name] 1 week ago and is being cared for primarily by his wife with home health RN.  His wife was having difficulty detaching TPN tubing from his PICC and called home health RN for assistance.  Home health RN reports a fever of 100.5 F and abdominal distention.  He was sent to the ED for evaluation of this.     ED Course: Upon arrival to the ED, patient is found to be afebrile and saturating well on his usual vent settings with systolic blood pressure of 91 and greater.  EKG demonstrates sinus rhythm, chest x-ray notable for some chronic findings but no acute cardiopulmonary disease, and KUB with no significant change in ileus or G-tube placement.  Labs notable for potassium 3.4, bicarbonate 19, alkaline phosphatase 133, AST 66, hemoglobin 10.7, lactic acid 0.9, and negative COVID/influenza/RSV PCR.   PCCM was consulted by the ED physician, evaluated the patient in the emergency department, and recommends observation on the hospitalist service given patient's increased risk of decompensation.    Obj: 12/23 afebrile overnight A/O x 4, joking with staff.    Objective: VITAL SIGNS: Temp: 98.3 F (36.8 C) (12/23 0600) Temp Source: Oral (12/23 0600) BP: 128/78 (12/23 0900) Pulse Rate: 67 (12/23 0900)   Vent settings 12/23 Mode PRVC Vt Set 500 ml Set rate 16 FiO2 30% PEEP 5 SpO2 100%    Intake/Output Summary (Last 24 hours) at 06/26/2022 1021 Last data filed at 06/26/2022 0800 Gross per 24 hour  Intake 3585.87 ml  Output 1400 ml  Net 2185.87 ml   Physical Exam:  General: A/O x 4, positive chronic respiratory distress Eyes: negative scleral hemorrhage, negative  anisocoria, negative icterus ENT: Negative Runny nose, negative gingival bleeding, Neck:  Negative scars, masses, torticollis, lymphadenopathy, JVD, #8 cuffed trach in place no signs of infection Lungs: Clear to auscultation bilaterally without wheezes or crackles Cardiovascular: Regular rate and rhythm without murmur gallop or rub normal S1 and S2 Abdomen: negative abdominal pain, nondistended, positive soft, bowel sounds, no rebound, no ascites, no appreciable mass Extremities: No significant cyanosis, clubbing, or edema bilateral lower extremities Skin: Negative rashes, lesions, ulcers Psychiatric:  Negative depression, negative anxiety, negative fatigue, negative mania  Central nervous system:  Cranial nerves II through XII intact, tongue/uvula midline, paralyzed from neck down, sensation intact     Mobility Assessment (last 72 hours)     Mobility Assessment   No documentation.             DVT prophylaxis: Lovenox Code Status: Full Family Communication: 12/23 called wife (Ms. [name]) and updated her on plan of care and lab results.   Status is: Inpatient    Dispo: The patient is from: Home              Anticipated d/c is to: Home              Anticipated d/c date is: 3 days              Patient currently is not medically stable to d/c.    Procedures/Significant Events: On admission: Right 3 lumen PICC line On admission RIGHT chest wall Port-A-Cath placed 2021 [name]  Per RN Bobbye [place] White ports nonfunctional 12/19 PEG  tube replaced new 24 Fr Enfit hub-type gastrostomy tube 12/21 right arm PICC line removed and tip sent to culture..   Consultants:  PCCM   Cultures 12/19 sputum Gram positive Pseudomonas aeruginosa resistant to Zosyn:  12/19 blood LEFT AC x 2 NGTD 12/20 urine positive multiple species 12/20 respiratory virus panel negative 12/21 cath tip NGTD   Antimicrobials: Anti-infectives (From admission, onward)    Start     Ordered Stop    06/25/22 1500  ceFEPIme (MAXIPIME) 2 g in sodium chloride 0.9 % 100 mL IVPB        06/25/22 1405     06/24/22 1400  piperacillin-tazobactam (ZOSYN) IVPB 3.375 g  Status:  Discontinued        06/24/22 1049 06/25/22 1405   06/24/22 1145  doxycycline (VIBRAMYCIN) 100 mg in sodium chloride 0.9 % 250 mL IVPB  Status:  Discontinued        06/24/22 1049 06/25/22 1114              Assessment & Plan: Covid vaccination;   Principal Problem:   Fever Active Problems:   ALS (amyotrophic lateral sclerosis) (HCC)   Autonomic dysfunction   Ventilator dependent ([name])   Tracheostomy dependent (HCC)   Ileus (HCC)   Chronic respiratory failure with hypoxia (HCC)   Tracheostomy dependence (HCC)   Chronic respiratory failure with hypoxia and hypercapnia (HCC)    Fever  - Presents with fever 100.5 F at home  - Afebrile here with normal lactate, no SIRS criteria or evidence for acute infection on ED workup, COVID/Flu/RSV pcr negative   - Sputum culture collected in ED, will also culture blood and urine, follow clinical course - 12/19 PEG tube changed -12/20 consult IV team to remove partially functioning PICC line will send for culture. -12/20 respiratory virus panel pending -12/20 PCXR pending -12/20 stain positive for gram-positive cocci in pairs, will hold antibiotics at this time negative leukocytosis, negative fever, but given patient's condition short fused for starting empiric antibiotics. - 12/21 sputum positive gram-negative rods (Haemophilus influenzae, Bordetella pertussis, Legionella pneumophila, and Acinetobacter baumannii?) -12/21 discussed case with pharmacy they concur that best to empirically start patient on antibiotics given his immunocompromise state.   Chronic hypoxic respiratory failure  - Stable on home vent settings, appreciate PCCM involvement    -12/20 stable on vent settings.  See vent settings above -12/22 trach change #8 cuffed - 12/22 stable on current vent  settings however patient has thick yellow sticky sputum C/W Pseudomonas.  Will consider starting hypertonic saline nebulizer if patient continues to have significant mucus plugging.   ALS  - Continue trach and PEG care, consult palliative care    Chronic ileus  -12/19 IR replaced PEG -12/20 per wife chronic has been there [facility], prior to going [facility].  Receives no food/drink except for medication through PEG. -12/20 per [facility] no training given for accessing PEG tube to place on intermittent suction. -12/21 RN Sarah to give instructions to wife when she comes to hospital today on how to look up patient's PEG tube to home suction, in order to provide intermittent suction when required.   Autonomic dysfunction  - Normal HR and BP in ED, continue to monitor    Hypoglycemia - 12/20 D50 1 amp - 12/20 D5-0.45% saline 47m/hr -12/21 DC D5-0.45% saline -12/21 normal saline 7101mhr   Hypokalemia - Potassium goal>4 - 12/23 K-Phos 15 mmol  Hypophosphatemia - Phosphorus goal> 2.5 -  12/23 slightly low see hypokalemia.  Pressure injury stage I Pressure Injury 12/26/21 Ear Right Stage 1 -  Intact skin with non-blanchable redness of a localized area usually over a bony prominence. (Active)  12/26/21 2000  Location: Ear  Location Orientation: Right  Staging: Stage 1 -  Intact skin with non-blanchable redness of a localized area usually over a bony prominence.  Wound Description (Comments):   Present on Admission:    Anemia unspecified -12/21 most likely anemia of chronic disease however will verify. - 12/21 anemia panel pending - 12/21 occult blood pending Lab Results  Component Value Date   HGB 8.4 (L) 06/26/2022   HGB 8.9 (L) 06/25/2022   HGB 9.4 (L) 06/24/2022   HGB 10.4 (L) 06/23/2022   HGB 11.6 (L) 06/22/2022  -Relatively stable.  Protein calorie malnutrition - 12/20 patient did not receive his TPN today, unknown  reason. -12/20 spoke with [place], TPN consult never placed, therefore patient did not receive his TPN.  Will start TPN on 12/21 -12/20 2x ports  PICC line nonfunctional.  Consult IV team for replacement of PICC line with 2 Lumens. -12/20 IV team order:Access patient's right chest wall port ensure that its functional.  Then remove patient's PICC line and send tip for lab for culture - 12/21 pharmacy restarting patient's home TPN -2000 ml over 24 hours, rate of 83.3 ml/hr -100 g protein, 250 g dextrose, 56 g Smof lipids, 1809 Kcal -NaCl 10 mEq, NaAcet 60 mEq, Kcl 16 mEq, Kphos 30 mmol, Mg 10 mEq, CaGluc 9 mEq MVI, Trace elements -12/23 continue TPN per pharmacy    Goals of care - 12/20 spoke with RN [place] who understands complexity of patient's medical problems.  Will attempt to place patient either and 38M or 4NP, or another appropriate unit.       Mobility Assessment (last 72 hours)     Mobility Assessment   No documentation.           Time: 50 minutes         Care during the described time interval was provided by me .  I have reviewed this patient's available data, including medical history, events of note, physical examination, and all test results as part of my evaluation.

## 2022-06-27 DIAGNOSIS — G1221 Amyotrophic lateral sclerosis: Secondary | ICD-10-CM | POA: Diagnosis not present

## 2022-06-27 DIAGNOSIS — K567 Ileus, unspecified: Secondary | ICD-10-CM | POA: Diagnosis not present

## 2022-06-27 DIAGNOSIS — G909 Disorder of the autonomic nervous system, unspecified: Secondary | ICD-10-CM | POA: Diagnosis not present

## 2022-06-27 DIAGNOSIS — J9611 Chronic respiratory failure with hypoxia: Secondary | ICD-10-CM | POA: Diagnosis not present

## 2022-06-27 LAB — URINE CULTURE: Culture: NO GROWTH

## 2022-06-27 LAB — CBC WITH DIFFERENTIAL/PLATELET
Abs Immature Granulocytes: 0.02 10*3/uL (ref 0.00–0.07)
Basophils Absolute: 0 10*3/uL (ref 0.0–0.1)
Basophils Relative: 0 %
Eosinophils Absolute: 0.2 10*3/uL (ref 0.0–0.5)
Eosinophils Relative: 5 %
HCT: 26.8 % — ABNORMAL LOW (ref 39.0–52.0)
Hemoglobin: 8.2 g/dL — ABNORMAL LOW (ref 13.0–17.0)
Immature Granulocytes: 1 %
Lymphocytes Relative: 23 %
Lymphs Abs: 1 10*3/uL (ref 0.7–4.0)
MCH: 26.6 pg (ref 26.0–34.0)
MCHC: 30.6 g/dL (ref 30.0–36.0)
MCV: 87 fL (ref 80.0–100.0)
Monocytes Absolute: 0.3 10*3/uL (ref 0.1–1.0)
Monocytes Relative: 7 %
Neutro Abs: 2.9 10*3/uL (ref 1.7–7.7)
Neutrophils Relative %: 64 %
Platelets: 143 10*3/uL — ABNORMAL LOW (ref 150–400)
RBC: 3.08 MIL/uL — ABNORMAL LOW (ref 4.22–5.81)
RDW: 18 % — ABNORMAL HIGH (ref 11.5–15.5)
WBC: 4.4 10*3/uL (ref 4.0–10.5)
nRBC: 0 % (ref 0.0–0.2)

## 2022-06-27 LAB — CATH TIP CULTURE: Culture: NO GROWTH

## 2022-06-27 LAB — GLUCOSE, CAPILLARY
Glucose-Capillary: 107 mg/dL — ABNORMAL HIGH (ref 70–99)
Glucose-Capillary: 116 mg/dL — ABNORMAL HIGH (ref 70–99)
Glucose-Capillary: 126 mg/dL — ABNORMAL HIGH (ref 70–99)

## 2022-06-27 LAB — CULTURE, RESPIRATORY W GRAM STAIN

## 2022-06-27 LAB — COMPREHENSIVE METABOLIC PANEL
ALT: 47 U/L — ABNORMAL HIGH (ref 0–44)
AST: 26 U/L (ref 15–41)
Albumin: 2.7 g/dL — ABNORMAL LOW (ref 3.5–5.0)
Alkaline Phosphatase: 89 U/L (ref 38–126)
Anion gap: 6 (ref 5–15)
BUN: 10 mg/dL (ref 6–20)
CO2: 24 mmol/L (ref 22–32)
Calcium: 9 mg/dL (ref 8.9–10.3)
Chloride: 106 mmol/L (ref 98–111)
Creatinine, Ser: <0.3 mg/dL — ABNORMAL LOW (ref 0.61–1.24)
Glucose, Bld: 133 mg/dL — ABNORMAL HIGH (ref 70–99)
Potassium: 4 mmol/L (ref 3.5–5.1)
Sodium: 136 mmol/L (ref 135–145)
Total Bilirubin: 0.4 mg/dL (ref 0.3–1.2)
Total Protein: 6.7 g/dL (ref 6.5–8.1)

## 2022-06-27 LAB — MAGNESIUM: Magnesium: 1.8 mg/dL (ref 1.7–2.4)

## 2022-06-27 LAB — CULTURE, BLOOD (ROUTINE X 2): Culture: NO GROWTH

## 2022-06-27 LAB — PHOSPHORUS: Phosphorus: 2.8 mg/dL (ref 2.5–4.6)

## 2022-06-27 MED ORDER — INSULIN ASPART 100 UNIT/ML IJ SOLN: 0.0000 [IU] | Freq: Four times a day (QID) | INTRAMUSCULAR | Status: DC

## 2022-06-27 MED ORDER — INSULIN ASPART 100 UNIT/ML IJ SOLN: 0.0000 [IU] | INTRAMUSCULAR | Status: DC

## 2022-06-27 MED ORDER — TRAVASOL 10 % IV SOLN
INTRAVENOUS | Status: AC
  Filled 2022-06-27: qty 1020

## 2022-06-27 MED ORDER — MAGNESIUM SULFATE 2 GM/50ML IV SOLN
2.0000 g | Freq: Once | INTRAVENOUS | Status: AC
  Administered 2022-06-27: 2 g via INTRAVENOUS
  Filled 2022-06-27: qty 50

## 2022-06-27 MED ORDER — INSULIN ASPART 100 UNIT/ML IJ SOLN
0.0000 [IU] | Freq: Four times a day (QID) | INTRAMUSCULAR | Status: DC
  Administered 2022-06-27 – 2022-06-30 (×7): 1 [IU] via SUBCUTANEOUS

## 2022-06-27 NOTE — Progress Notes (Signed)
PHARMACY - TOTAL PARENTERAL NUTRITION CONSULT NOTE   Indication: chronic ileus in the setting of ALS   Patient Measurements: Height: '5\' 5"'$  (165.1 cm) Weight: 57.6 kg (126 lb 15.8 oz) IBW/kg (Calculated) : 61.5 TPN AdjBW (KG): 57.3 Body mass index is 21.13 kg/m. Usual Weight: 120- 130 lbs  Assessment:  51 yo man on TPN through [facility] prior to admission for chronic ileus related to ALS, now admitted with ileus. Patient also has a PEG tube that is used for medication administration. Pharmacy consulted to resume TPN.  Glucose / Insulin: BG < 180, no hx DM, A1C 5.4, used 3 units SSI last 24 hrs Electrolytes: Na 136, K 4 (received 35 mEq IV in Kphos, goal >/=4), Mg 1.8 (ordered 2g IV, goal >/=2), CoCa 10, others wnl Renal: Scr < 0.3, BUN wnl Hepatic: ALT 83 down, Albumin 3.1, TG 24 Intake / Output; MIVF:  NS @ 75 ml/hr; UOP 1.6 ml/kg/hr, BM x1 12/22 GI Imaging: 12/19 KUB: chronic ileus gas pattern GI Surgeries / Procedures: none  Central access: implanted port 07/06/19  TPN start date: PTA  Nutritional Goals: Goal TPN rate is 85 mL/hr (provides 102 g of protein and 1847 kcals per day)  RD Assessment: pending Estimated Needs Total Energy Estimated Needs: 1800-1900 Total Protein Estimated Needs: 95-105 grams Total Fluid Estimated Needs: >1.8 L/day  Home TPN formula (Added to media files): -2000 ml over 24 hours, rate of 83.3 ml/hr -100 g protein, 250 g dextrose, 56 g Smof lipids, 1809 Kcal -NaCl 10 mEq, NaAcet 60 mEq, Kcl 16 mEq, Kphos 30 mmol, Mg 10 mEq, CaGluc 9 mEq MVI, Trace elements   Current Nutrition:  NPO + TPN  Plan:  Continue goal TPN at 85 mL/hr at 1800, provides 102 g protein, 1847 kcals, meeting 100% of estimated needs Electrolytes in TPN: Increase Na 100 mEq/L,  Mg 15 mEq/L (max in TPN) ; Continue K 60 mEq/L,Ca 4 mEq/L,  and Phos 20 mmol/L, Cl:Ac 1:2;  Add standard MVI and trace elements to TPN  Stop NS @ 75 ml/hr  Monitor fluid status, decrease TPN volume if  needed (has 237m sterile water)  Monitor TPN labs Mon/Thurs and as needed   LBenetta Spar PharmD, BCPS, BCCP Clinical Pharmacist  Please check AMION for all [place]phone numbers After 10:00 PM, call [facility]

## 2022-06-27 NOTE — Progress Notes (Signed)
[name] TGG:[id number] DOB: Apr 28, 1971 DOA: 06/22/2022 PCP: [name], MD   Subj:  51 y.o. BM PMHx  ALS with chronic tracheostomy/ventilator-dependent hypoxic respiratory failure, autonomic dysfunction, and ileus on TPN   Presents to the emergency department with fever.   Patient was discharged home from [facility] 1 week ago and is being cared for primarily by his wife with home health RN.  His wife was having difficulty detaching TPN tubing from his PICC and called home health RN for assistance.  Home health RN reports a fever of 100.5 F and abdominal distention.  He was sent to the ED for evaluation of this.     ED Course: Upon arrival to the ED, patient is found to be afebrile and saturating well on his usual vent settings with systolic blood pressure of 91 and greater.  EKG demonstrates sinus rhythm, chest x-ray notable for some chronic findings but no acute cardiopulmonary disease, and KUB with no significant change in ileus or G-tube placement.  Labs notable for potassium 3.4, bicarbonate 19, alkaline phosphatase 133, AST 66, hemoglobin 10.7, lactic acid 0.9, and negative COVID/influenza/RSV PCR.   PCCM was consulted by the ED physician, evaluated the patient in the emergency department, and recommends observation on the hospitalist service given patient's increased risk of decompensation.    Obj: 12/24 afebrile overnight A/O x 4, no complaints    Objective: VITAL SIGNS: Temp: 97.9 F (36.6 C) (12/24 0750) BP: 147/84 (12/24 0800) Pulse Rate: 78 (12/24 0800)   Vent settings 12/23 Mode PRVC Vt Set 500 ml Set rate 16 FiO2 30% PEEP 5 SpO2 100%    Intake/Output Summary (Last 24 hours) at 06/27/2022 7035 Last data filed at 06/27/2022 0800 Gross per 24 hour  Intake 4114.18 ml  Output 2150 ml  Net 1964.18 ml    Physical Exam:  General: A/O x 4, positive chronic respiratory distress Eyes: negative scleral hemorrhage, negative anisocoria, negative icterus ENT:  Negative Runny nose, negative gingival bleeding, Neck:  Negative scars, masses, torticollis, lymphadenopathy, JVD, #8 cuffed trach in place no signs of infection Lungs: Clear to auscultation bilaterally without wheezes or crackles Cardiovascular: Regular rate and rhythm without murmur gallop or rub normal S1 and S2 Abdomen: negative abdominal pain, nondistended, positive soft, bowel sounds, no rebound, no ascites, no appreciable mass Extremities: No significant cyanosis, clubbing, or edema bilateral lower extremities Skin: Negative rashes, lesions, ulcers Psychiatric:  Negative depression, negative anxiety, negative fatigue, negative mania  Central nervous system: Nods yes and no to questions and uses his computer I sensitive.  Paralyzed from neck down     Mobility Assessment (last 72 hours)     Mobility Assessment   No documentation.             DVT prophylaxis: Lovenox Code Status: Full Family Communication: 12/23 called wife (Ms. Carlota Raspberry) and updated her on plan of care and lab results.   Status is: Inpatient    Dispo: The patient is from: Home              Anticipated d/c is to: Home              Anticipated d/c date is: 3 days              Patient currently is not medically stable to d/c.    Procedures/Significant Events: On admission: Right 3 lumen PICC line On admission RIGHT chest wall Port-A-Cath placed 2021 [facility].  Per RN Bobbye [place] White ports nonfunctional 12/19 PEG tube replaced new  24 Fr Enfit hub-type gastrostomy tube 12/21 right arm PICC line removed and tip sent to culture..   Consultants:  PCCM   Cultures 12/19 sputum Gram positive Pseudomonas aeruginosa resistant to Zosyn:  12/19 blood LEFT AC x 2 NGTD 12/20 urine positive multiple species 12/20 respiratory virus panel negative 12/21 cath tip NGTD   Antimicrobials: Anti-infectives (From admission, onward)    Start     Ordered Stop   06/25/22 1500  ceFEPIme (MAXIPIME) 2 g in sodium  chloride 0.9 % 100 mL IVPB        06/25/22 1405     06/24/22 1400  piperacillin-tazobactam (ZOSYN) IVPB 3.375 g  Status:  Discontinued        06/24/22 1049 06/25/22 1405   06/24/22 1145  doxycycline (VIBRAMYCIN) 100 mg in sodium chloride 0.9 % 250 mL IVPB  Status:  Discontinued        06/24/22 1049 06/25/22 1114              Assessment & Plan: Covid vaccination;   Principal Problem:   Fever Active Problems:   ALS (amyotrophic lateral sclerosis) (HCC)   Autonomic dysfunction   Ventilator dependent ([facility])   Tracheostomy dependent (HCC)   Ileus (HCC)   Chronic respiratory failure with hypoxia (HCC)   Tracheostomy dependence (HCC)   Chronic respiratory failure with hypoxia and hypercapnia (HCC)    Fever  - Presents with fever 100.5 F at home  - Afebrile here with normal lactate, no SIRS criteria or evidence for acute infection on ED workup, COVID/Flu/RSV pcr negative   - Sputum culture collected in ED, will also culture blood and urine, follow clinical course - 12/19 PEG tube changed -12/20 consult IV team to remove partially functioning PICC line will send for culture. -12/20 respiratory virus panel pending -12/20 PCXR pending -12/20 stain positive for gram-positive cocci in pairs, will hold antibiotics at this time negative leukocytosis, negative fever, but given patient's condition short fused for starting empiric antibiotics. - 12/21 sputum positive gram-negative rods (Haemophilus influenzae, Bordetella pertussis, Legionella pneumophila, and Acinetobacter baumannii?) -12/21 discussed case with pharmacy they concur that best to empirically start patient on antibiotics given his immunocompromise state.   Chronic hypoxic respiratory failure  - Stable on home vent settings, appreciate PCCM involvement    -12/20 stable on vent settings.  See vent settings above -12/22 trach change #8 cuffed - 12/22 stable on current vent settings however patient has thick yellow sticky sputum  C/W Pseudomonas.  Will consider starting hypertonic saline nebulizer if patient continues to have significant mucus plugging.   ALS  - Continue trach and PEG care, consult palliative care    Chronic ileus  -12/19 IR replaced PEG -12/20 per wife chronic has been there [facility], prior to going home.  Receives no food/drink except for medication through PEG. -12/20 per wife [facility] no training given for accessing PEG tube to place on intermittent suction. -12/21 RN Sarah to give instructions to wife when she comes to hospital today on how to look up patient's PEG tube to home suction, in order to provide intermittent suction when required. - 12/24 schedule patient for KUB to reevaluate ileus on 12/25   Autonomic dysfunction  - Normal HR and BP in ED, continue to monitor    Hypoglycemia - 12/20 D50 1 amp - 12/20 D5-0.45% saline 60m/hr -12/21 DC D5-0.45% saline -12/21 normal saline 739mhr   Hypokalemia - Potassium goal>4 - 12/23 K-Phos 15  mmol  Hypophosphatemia - Phosphorus goal> 2.5 - 12/23 slightly low see hypokalemia.  Pressure injury stage I Pressure Injury 12/26/21 Ear Right Stage 1 -  Intact skin with non-blanchable redness of a localized area usually over a bony prominence. (Active)  12/26/21 2000  Location: Ear  Location Orientation: Right  Staging: Stage 1 -  Intact skin with non-blanchable redness of a localized area usually over a bony prominence.  Wound Description (Comments):   Present on Admission:    Anemia unspecified -12/21 most likely anemia of chronic disease however will verify. - 12/21 anemia panel pending - 12/21 occult blood pending Lab Results  Component Value Date   HGB 8.2 (L) 06/27/2022   HGB 8.4 (L) 06/26/2022   HGB 8.9 (L) 06/25/2022   HGB 9.4 (L) 06/24/2022   HGB 10.4 (L) 06/23/2022  -Relatively stable.  Protein calorie malnutrition - 12/20 patient did not receive his TPN today, unknown  reason. -12/20 spoke with [facility], TPN consult never placed, therefore patient did not receive his TPN.  Will start TPN on 12/21 -12/20 2x ports  PICC line nonfunctional.  Consult IV team for replacement of PICC line with 2 Lumens. -12/20 IV team order:Access patient's right chest wall port ensure that its functional.  Then remove patient's PICC line and send tip for lab for culture - 12/21 pharmacy restarting patient's home TPN -2000 ml over 24 hours, rate of 83.3 ml/hr -100 g protein, 250 g dextrose, 56 g Smof lipids, 1809 Kcal -NaCl 10 mEq, NaAcet 60 mEq, Kcl 16 mEq, Kphos 30 mmol, Mg 10 mEq, CaGluc 9 mEq MVI, Trace elements -12/23 continue TPN per pharmacy    Goals of care - 12/20 spoke with RN [place] who understands complexity of patient's medical problems.  Will attempt to place patient either and 59M or 4NP, or another appropriate unit.       Mobility Assessment (last 72 hours)     Mobility Assessment   No documentation.           Time: 50 minutes         Care during the described time interval was provided by me .  I have reviewed this patient's available data, including medical history, events of note, physical examination, and all test results as part of my evaluation.

## 2022-06-28 ENCOUNTER — Inpatient Hospital Stay (HOSPITAL_COMMUNITY): Payer: Medicare HMO

## 2022-06-28 DIAGNOSIS — G1221 Amyotrophic lateral sclerosis: Secondary | ICD-10-CM | POA: Diagnosis not present

## 2022-06-28 DIAGNOSIS — J9611 Chronic respiratory failure with hypoxia: Secondary | ICD-10-CM | POA: Diagnosis not present

## 2022-06-28 DIAGNOSIS — K567 Ileus, unspecified: Secondary | ICD-10-CM | POA: Diagnosis not present

## 2022-06-28 DIAGNOSIS — G909 Disorder of the autonomic nervous system, unspecified: Secondary | ICD-10-CM | POA: Diagnosis not present

## 2022-06-28 LAB — CBC WITH DIFFERENTIAL/PLATELET
Abs Immature Granulocytes: 0.02 10*3/uL (ref 0.00–0.07)
Basophils Absolute: 0 10*3/uL (ref 0.0–0.1)
Basophils Relative: 0 %
Eosinophils Absolute: 0.2 10*3/uL (ref 0.0–0.5)
Eosinophils Relative: 4 %
HCT: 27.6 % — ABNORMAL LOW (ref 39.0–52.0)
Hemoglobin: 8.6 g/dL — ABNORMAL LOW (ref 13.0–17.0)
Immature Granulocytes: 0 %
Lymphocytes Relative: 20 %
Lymphs Abs: 1.2 10*3/uL (ref 0.7–4.0)
MCH: 27 pg (ref 26.0–34.0)
MCHC: 31.2 g/dL (ref 30.0–36.0)
MCV: 86.5 fL (ref 80.0–100.0)
Monocytes Absolute: 0.3 10*3/uL (ref 0.1–1.0)
Monocytes Relative: 6 %
Neutro Abs: 4 10*3/uL (ref 1.7–7.7)
Neutrophils Relative %: 70 %
Platelets: 157 10*3/uL (ref 150–400)
RBC: 3.19 MIL/uL — ABNORMAL LOW (ref 4.22–5.81)
RDW: 17.9 % — ABNORMAL HIGH (ref 11.5–15.5)
WBC: 5.8 10*3/uL (ref 4.0–10.5)
nRBC: 0 % (ref 0.0–0.2)

## 2022-06-28 LAB — VITAMIN B12: Vitamin B-12: 1390 pg/mL — ABNORMAL HIGH (ref 180–914)

## 2022-06-28 LAB — COMPREHENSIVE METABOLIC PANEL
ALT: 52 U/L — ABNORMAL HIGH (ref 0–44)
AST: 28 U/L (ref 15–41)
Albumin: 2.9 g/dL — ABNORMAL LOW (ref 3.5–5.0)
Alkaline Phosphatase: 93 U/L (ref 38–126)
Anion gap: 6 (ref 5–15)
BUN: 11 mg/dL (ref 6–20)
CO2: 25 mmol/L (ref 22–32)
Calcium: 8.7 mg/dL — ABNORMAL LOW (ref 8.9–10.3)
Chloride: 107 mmol/L (ref 98–111)
Creatinine, Ser: <0.3 mg/dL — ABNORMAL LOW (ref 0.61–1.24)
Glucose, Bld: 134 mg/dL — ABNORMAL HIGH (ref 70–99)
Potassium: 4.1 mmol/L (ref 3.5–5.1)
Sodium: 138 mmol/L (ref 135–145)
Total Bilirubin: 0.5 mg/dL (ref 0.3–1.2)
Total Protein: 6.9 g/dL (ref 6.5–8.1)

## 2022-06-28 LAB — IRON AND TIBC
Iron: 49 ug/dL (ref 45–182)
Saturation Ratios: 27 % (ref 17.9–39.5)
TIBC: 181 ug/dL — ABNORMAL LOW (ref 250–450)
UIBC: 132 ug/dL

## 2022-06-28 LAB — PHOSPHORUS: Phosphorus: 2.9 mg/dL (ref 2.5–4.6)

## 2022-06-28 LAB — GLUCOSE, CAPILLARY
Glucose-Capillary: 107 mg/dL — ABNORMAL HIGH (ref 70–99)
Glucose-Capillary: 125 mg/dL — ABNORMAL HIGH (ref 70–99)
Glucose-Capillary: 126 mg/dL — ABNORMAL HIGH (ref 70–99)
Glucose-Capillary: 128 mg/dL — ABNORMAL HIGH (ref 70–99)

## 2022-06-28 LAB — RETICULOCYTES
Immature Retic Fract: 15 % (ref 2.3–15.9)
RBC.: 3.22 MIL/uL — ABNORMAL LOW (ref 4.22–5.81)
Retic Count, Absolute: 21.3 10*3/uL (ref 19.0–186.0)
Retic Ct Pct: 0.7 % (ref 0.4–3.1)

## 2022-06-28 LAB — FOLATE: Folate: 15.2 ng/mL (ref >5.9)

## 2022-06-28 LAB — CULTURE, BLOOD (ROUTINE X 2)
Culture: NO GROWTH
Special Requests: ADEQUATE

## 2022-06-28 LAB — FERRITIN: Ferritin: 208 ng/mL (ref 24–336)

## 2022-06-28 LAB — MAGNESIUM: Magnesium: 2.1 mg/dL (ref 1.7–2.4)

## 2022-06-28 LAB — TRIGLYCERIDES: Triglycerides: 35 mg/dL (ref <150)

## 2022-06-28 MED ORDER — TRAVASOL 10 % IV SOLN
INTRAVENOUS | Status: AC
  Filled 2022-06-28: qty 1020

## 2022-06-28 NOTE — Progress Notes (Signed)
Jerry [name] ANV:[id number] DOB: [birth year] DOA: 06/22/2022 PCP: [name], MD   Subj:  51 y.o. BM PMHx  ALS with chronic tracheostomy/ventilator-dependent hypoxic respiratory failure, autonomic dysfunction, and ileus on TPN   Presents to the emergency department with fever.   Patient was discharged home from [place] 1 week ago and is being cared for primarily by his wife with home health RN.  His wife was having difficulty detaching TPN tubing from his PICC and called home health RN for assistance.  Home health RN reports a fever of 100.5 F and abdominal distention.  He was sent to the ED for evaluation of this.     ED Course: Upon arrival to the ED, patient is found to be afebrile and saturating well on his usual vent settings with systolic blood pressure of 91 and greater.  EKG demonstrates sinus rhythm, chest x-ray notable for some chronic findings but no acute cardiopulmonary disease, and KUB with no significant change in ileus or G-tube placement.  Labs notable for potassium 3.4, bicarbonate 19, alkaline phosphatase 133, AST 66, hemoglobin 10.7, lactic acid 0.9, and negative COVID/influenza/RSV PCR.   PCCM was consulted by the ED physician, evaluated the patient in the emergency department, and recommends observation on the hospitalist service given patient's increased risk of decompensation.    Obj: 12/25 afebrile overnight, A/O x 4 no complaints.    Objective: VITAL SIGNS: Temp: 98.2 F (36.8 C) (12/25 0754) Temp Source: Oral (12/25 0754) BP: 139/84 (12/25 0900) Pulse Rate: 71 (12/25 0900)   Vent settings 12/25 Mode PRVC Vt Set 500 ml Set rate 16 FiO2 30% PEEP 5 SpO2 99%    Intake/Output Summary (Last 24 hours) at 06/28/2022 1025 Last data filed at 06/28/2022 0900 Gross per 24 hour  Intake 2296.24 ml  Output 2800 ml  Net -503.76 ml   Physical Exam:  General:A/O x 4, positive chronic respiratory distress Eyes: negative scleral hemorrhage, negative anisocoria,  negative icterus ENT: Negative Runny nose, negative gingival bleeding, Neck:  Negative scars, masses, torticollis, lymphadenopathy, JVD, #8 cuffed trach in place negative sign of infection Lungs: Clear to auscultation bilaterally without wheezes or crackles Cardiovascular: Regular rate and rhythm without murmur gallop or rub normal S1 and S2 Abdomen: negative abdominal pain, nondistended, positive soft, bowel sounds, no rebound, no ascites, no appreciable mass Extremities: No significant cyanosis, clubbing, or edema bilateral lower extremities Skin: Negative rashes, lesions, ulcers Psychiatric:  Negative depression, negative anxiety, negative fatigue, negative mania  Central nervous system:  Nods yes and no to questions and uses his computer eye sensitive.  Paralyzed from neck down    Mobility Assessment (last 72 hours)     Mobility Assessment   No documentation.             DVT prophylaxis: Lovenox Code Status: Full Family Communication: 12/23 called wife (Ms. Carlota Raspberry) and updated her on plan of care and lab results.   Status is: Inpatient    Dispo: The patient is from: Home              Anticipated d/c is to: Home              Anticipated d/c date is: 3 days              Patient currently is not medically stable to d/c.    Procedures/Significant Events: On admission: Right 3 lumen PICC line On admission RIGHT chest wall Port-A-Cath placed 2021 [facility].  Per RN Bobbye [place] White ports nonfunctional 12/19 PEG  tube replaced new 24 Fr Enfit hub-type gastrostomy tube 12/21 right arm PICC line removed and tip sent to culture..   Consultants:  PCCM   Cultures 12/19 sputum Gram positive Pseudomonas aeruginosa resistant to Zosyn:  12/19 blood LEFT AC x 2 negative final 12/20 urine positive multiple species 12/20 respiratory virus panel negative 12/21 cath tip NGTD 12/23 urine negative   Antimicrobials: Anti-infectives (From admission, onward)    Start      Ordered Stop   06/25/22 1500  ceFEPIme (MAXIPIME) 2 g in sodium chloride 0.9 % 100 mL IVPB        06/25/22 1405     06/24/22 1400  piperacillin-tazobactam (ZOSYN) IVPB 3.375 g  Status:  Discontinued        06/24/22 1049 06/25/22 1405   06/24/22 1145  doxycycline (VIBRAMYCIN) 100 mg in sodium chloride 0.9 % 250 mL IVPB  Status:  Discontinued        06/24/22 1049 06/25/22 1114              Assessment & Plan: Covid vaccination;   Principal Problem:   Fever Active Problems:   ALS (amyotrophic lateral sclerosis) (HCC)   Autonomic dysfunction   Ventilator dependent ([place])   Tracheostomy dependent (HCC)   Ileus (HCC)   Chronic respiratory failure with hypoxia (HCC)   Tracheostomy dependence ([place])   Chronic respiratory failure with hypoxia and hypercapnia (HCC)    Fever  - Presents with fever 100.5 F at home  - Afebrile here with normal lactate, no SIRS criteria or evidence for acute infection on ED workup, COVID/Flu/RSV pcr negative   - Sputum culture collected in ED, will also culture blood and urine, follow clinical course - 12/19 PEG tube changed -12/20 consult IV team to remove partially functioning PICC line will send for culture. -12/20 respiratory virus panel pending -12/20 PCXR pending -12/20 stain positive for gram-positive cocci in pairs, will hold antibiotics at this time negative leukocytosis, negative fever, but given patient's condition short fused for starting empiric antibiotics. - 12/21 sputum positive gram-negative rods (Haemophilus influenzae, Bordetella pertussis, Legionella pneumophila, and Acinetobacter baumannii?) -12/21 discussed case with pharmacy they concur that best to empirically start patient on antibiotics given his immunocompromise state. -12/25 blood cultures and cath tip NGTD if they remain so overnight will replace patient's PICC line  Pseudomonas pneumonia - 12/25 sputum was positive for drug-resistant Pseudomonas.  Patient did not exhibit  other signs and symptoms i.e. negative leukocytosis, however with treatment signs and symptoms have cleared.  Chronic hypoxic respiratory failure  - Stable on home vent settings, appreciate PCCM involvement    -12/20 stable on vent settings.  See vent settings above -12/22 trach change #8 cuffed - 12/22 stable on current vent settings however patient has thick yellow sticky sputum C/W Pseudomonas.  Will consider starting hypertonic saline nebulizer if patient continues to have significant mucus plugging. -12/25 with change of trach and treatment for Pseudomonas pneumonia patient plugging has resolved.   ALS  - Continue trach and PEG care, consult palliative care    Chronic ileus  -12/19 IR replaced PEG -12/20 per wife chronic has been there since [facility], prior to going home.  Receives no food/drink except for medication through PEG. -12/20 per wife prior to [facility] no training given for accessing PEG tube to place on intermittent suction. -12/21 RN Sarah to give instructions to wife when she comes to hospital today on how to look up patient's PEG tube to  home suction, in order to provide intermittent suction when required. - 12/24 schedule patient for KUB to reevaluate ileus on 12/25 -12/25 KUB, patient with continued chronic ileus (my read)   Autonomic dysfunction  - Normal HR and BP in ED, continue to monitor    Hypoglycemia - 12/20 D50 1 amp - 12/20 D5-0.45% saline 59m/hr -12/21 DC D5-0.45% saline -12/21 normal saline 727mhr   Hypokalemia - Potassium goal>4 - 12/23 K-Phos 15 mmol  Hypophosphatemia - Phosphorus goal> 2.5 - 12/23 slightly low see hypokalemia.  Pressure injury stage I Pressure Injury 12/26/21 Ear Right Stage 1 -  Intact skin with non-blanchable redness of a localized area usually over a bony prominence. (Active)  12/26/21 2000  Location: Ear  Location Orientation: Right  Staging: Stage 1 -  Intact skin with non-blanchable  redness of a localized area usually over a bony prominence.  Wound Description (Comments):   Present on Admission:    Anemia, Citic -12/21 most likely anemia of chronic disease however will verify. - 12/21 anemia panel consistent with normocytic anemia - 12/21 occult blood pending Lab Results  Component Value Date   HGB 8.6 (L) 06/28/2022   HGB 8.2 (L) 06/27/2022   HGB 8.4 (L) 06/26/2022   HGB 8.9 (L) 06/25/2022   HGB 9.4 (L) 06/24/2022  -Relatively stable.  Protein calorie malnutrition - 12/20 patient did not receive his TPN today, unknown reason. -12/20 [place] never placed, therefore patient did not receive his TPN.  Will start TPN on 12/21 -12/20 2x ports  PICC line nonfunctional.  Consult IV team for replacement of PICC line with 2 Lumens. -12/20 IV team order:Access patient's right chest wall port ensure that its functional.  Then remove patient's PICC line and send tip for lab for culture - 12/21 pharmacy restarting patient's home TPN -2000 ml over 24 hours, rate of 83.3 ml/hr -100 g protein, 250 g dextrose, 56 g Smof lipids, 1809 Kcal -NaCl 10 mEq, NaAcet 60 mEq, Kcl 16 mEq, Kphos 30 mmol, Mg 10 mEq, CaGluc 9 mEq MVI, Trace elements -12/23 continue TPN per pharmacy    Goals of care - 12/20 spoke with RN [place] understands complexity of patient's medical problems.  Will attempt to place patient either and 99M or 4NP, or another appropriate unit.       Mobility Assessment (last 72 hours)     Mobility Assessment   No documentation.           Time: 50 minutes         Care during the described time interval was provided by me .  I have reviewed this patient's available data, including medical history, events of note, physical examination, and all test results as part of my evaluation.

## 2022-06-28 NOTE — Progress Notes (Signed)
PHARMACY ANTIBIOTIC CONSULT NOTE   Jerry [name] a 51 y.o. male admitted on 07/02/22 with fever. PMH significant for ALS with chronic trach, autonomic dysfunction, and ileus on TPN. Pt appears to be chronically colonized with PsA in his trach and again grew pseudomonas aeruginosa that was resistant to Zosyn. Patient has required trach exchange this admit as a result of mucous plugging, as such will transition him from Zosyn to cefepime.  Pharmacy has been consulted for cefepime dosing.  12/25: Scr <0.30, WBC 8.0>5.8, afebrile. CrCl >60 ml/min  Plan: Continue Cefepime 2g IV Q8H Monitor renal function, clinical status, C/S, de-escalation   Allergies:  Allergies  Allergen Reactions   Crab [Shellfish Allergy] Rash    Filed Weights   06/25/22 0500 06/26/22 0500 06/28/22 0359  Weight: 55.8 kg (123 lb 0.3 oz) 57.6 kg (126 lb 15.8 oz) 56.9 kg (125 lb 7.1 oz)       Latest Ref Rng & Units 06/28/2022    3:49 AM 06/27/2022    4:54 AM 06/26/2022    4:16 AM  CBC  WBC 4.0 - 10.5 K/uL 5.8  4.4  5.7   Hemoglobin 13.0 - 17.0 g/dL 8.6  8.2  8.4   Hematocrit 39.0 - 52.0 % 27.6  26.8  27.1   Platelets 150 - 400 K/uL 157  143  133     Antibiotics Given (last 72 hours)     Date/Time Action Medication Dose Rate   06/25/22 1345 New Bag/Given   piperacillin-tazobactam (ZOSYN) IVPB 3.375 g 3.375 g 12.5 mL/hr   06/25/22 1558 New Bag/Given   ceFEPIme (MAXIPIME) 2 g in sodium chloride 0.9 % 100 mL IVPB 2 g 200 mL/hr   06/25/22 2145 New Bag/Given   ceFEPIme (MAXIPIME) 2 g in sodium chloride 0.9 % 100 mL IVPB 2 g 200 mL/hr   06/26/22 0543 New Bag/Given   ceFEPIme (MAXIPIME) 2 g in sodium chloride 0.9 % 100 mL IVPB 2 g 200 mL/hr   06/26/22 1340 New Bag/Given   ceFEPIme (MAXIPIME) 2 g in sodium chloride 0.9 % 100 mL IVPB 2 g 200 mL/hr   06/26/22 2122 New Bag/Given   ceFEPIme (MAXIPIME) 2 g in sodium chloride 0.9 % 100 mL IVPB 2 g 200 mL/hr   06/27/22 0506 New Bag/Given   ceFEPIme (MAXIPIME) 2 g in  sodium chloride 0.9 % 100 mL IVPB 2 g 200 mL/hr   06/27/22 1301 New Bag/Given   ceFEPIme (MAXIPIME) 2 g in sodium chloride 0.9 % 100 mL IVPB 2 g 200 mL/hr   06/27/22 2235 New Bag/Given   ceFEPIme (MAXIPIME) 2 g in sodium chloride 0.9 % 100 mL IVPB 2 g 200 mL/hr   06/28/22 3500 New Bag/Given   ceFEPIme (MAXIPIME) 2 g in sodium chloride 0.9 % 100 mL IVPB 2 g 200 mL/hr       Antimicrobials this admission: 12/21 Zosyn>>12/22 12/22 Cefepime>>c  12/21 Doxycycline>>12/22  Microbiology results: 12/19 Bcx: NGTD 12/21 Cath tip Cx: NGTD  12/21 MRSA PCR: not detected 12/20 Ucx: multiple spp 12/20 RVP: not detected  12/19 sputum cx: moderate pseudomonas aeruginosa (R-Zosyn)   Thank you for allowing pharmacy to participate in this patient's care.  Reatha Harps, PharmD PGY2 Pharmacy Resident 06/28/2022 12:21 PM Check [email or web address] for unit specific pharmacy number

## 2022-06-28 NOTE — Progress Notes (Signed)
PHARMACY - TOTAL PARENTERAL NUTRITION CONSULT NOTE  Indication: chronic ileus in the setting of ALS   Patient Measurements: Height: '5\' 5"'$  (165.1 cm) Weight: 56.9 kg (125 lb 7.1 oz) IBW/kg (Calculated) : 61.5 TPN AdjBW (KG): 57.3 Body mass index is 20.87 kg/m. Usual Weight: 120- 130 lbs  Assessment:  51 yo man on TPN through [facility] prior to admission for chronic ileus related to ALS, now admitted with ileus. Patient also has a PEG tube that is used for medication administration. Pharmacy consulted to resume TPN.  Glucose / Insulin: no hx DM, A1C 5.4% - CBGs < 180 Used 1 unit SSI last 24 hrs Electrolytes: all WNL (Mag 2.1 post 2gm IV) Renal: SCr < 0.3, BUN WNL Hepatic: LFTs / tbili / TG WNL, albumin 2.9 Intake / Output; MIVF: UOP 20m/kg/hr, LBM 12/24, net +6.5L GI Imaging: 12/19 KUB: chronic ileus gas pattern 12/25 KUB: pending GI Surgeries / Procedures: none  Central access: implanted port 07/06/19  TPN start date: PTA  Nutritional Goals:  RD Estimated Needs Total Energy Estimated Needs: 1800-1900 Total Protein Estimated Needs: 95-105 grams Total Fluid Estimated Needs: >1.8 L/day  Home TPN formula (Added to media files): -2000 ml over 24 hours, rate of 83.3 ml/hr -100 g protein, 250 g dextrose, 56 g Smof lipids, 1809 Kcal -NaCl 10 mEq, NaAcet 60 mEq, Kcl 16 mEq, Kphos 30 mmol, Mg 10 mEq, CaGluc 9 mEq MVI, Trace elements   Current Nutrition:  NPO + TPN  Plan:  Continue TPN at goal rate of 85 mL/hr to provide 102g AA and 1847 kCal, meeting 100% of estimated needs Electrolytes in TPN: increase Na 1063m/L on 12/24, Mg 1563mL (max in TPN), K 64m36m,Ca 4mEq39m Phos 20mmo62m Cl:Ac 1:2 Add standard MVI and trace elements to TPN  Continue sensitive SSI Q6H - consider stopping if minimally used Monitor fluid status, decrease TPN volume if needed (has 200ml s35mle water)  Monitor TPN labs Mon/Thurs and as needed  Tanny Harnack D. Keoshia Steinmetz, PMina MarbleD, BCPS, BCCCP [id number], 7:21  AM

## 2022-06-29 ENCOUNTER — Inpatient Hospital Stay: Payer: Self-pay

## 2022-06-29 DIAGNOSIS — G909 Disorder of the autonomic nervous system, unspecified: Secondary | ICD-10-CM | POA: Diagnosis not present

## 2022-06-29 DIAGNOSIS — J9611 Chronic respiratory failure with hypoxia: Secondary | ICD-10-CM | POA: Diagnosis not present

## 2022-06-29 DIAGNOSIS — K567 Ileus, unspecified: Secondary | ICD-10-CM | POA: Diagnosis not present

## 2022-06-29 DIAGNOSIS — G1221 Amyotrophic lateral sclerosis: Secondary | ICD-10-CM | POA: Diagnosis not present

## 2022-06-29 LAB — COMPREHENSIVE METABOLIC PANEL
ALT: 50 U/L — ABNORMAL HIGH (ref 0–44)
AST: 30 U/L (ref 15–41)
Albumin: 2.9 g/dL — ABNORMAL LOW (ref 3.5–5.0)
Alkaline Phosphatase: 86 U/L (ref 38–126)
Anion gap: 6 (ref 5–15)
BUN: 14 mg/dL (ref 6–20)
CO2: 27 mmol/L (ref 22–32)
Calcium: 9 mg/dL (ref 8.9–10.3)
Chloride: 107 mmol/L (ref 98–111)
Creatinine, Ser: <0.3 mg/dL — ABNORMAL LOW (ref 0.61–1.24)
Glucose, Bld: 126 mg/dL — ABNORMAL HIGH (ref 70–99)
Potassium: 4.1 mmol/L (ref 3.5–5.1)
Sodium: 140 mmol/L (ref 135–145)
Total Bilirubin: 0.4 mg/dL (ref 0.3–1.2)
Total Protein: 6.9 g/dL (ref 6.5–8.1)

## 2022-06-29 LAB — CBC WITH DIFFERENTIAL/PLATELET
Abs Immature Granulocytes: 0.05 10*3/uL (ref 0.00–0.07)
Basophils Absolute: 0 10*3/uL (ref 0.0–0.1)
Basophils Relative: 1 %
Eosinophils Absolute: 0.3 10*3/uL (ref 0.0–0.5)
Eosinophils Relative: 4 %
HCT: 27.3 % — ABNORMAL LOW (ref 39.0–52.0)
Hemoglobin: 8.5 g/dL — ABNORMAL LOW (ref 13.0–17.0)
Immature Granulocytes: 1 %
Lymphocytes Relative: 17 %
Lymphs Abs: 1.3 10*3/uL (ref 0.7–4.0)
MCH: 26.7 pg (ref 26.0–34.0)
MCHC: 31.1 g/dL (ref 30.0–36.0)
MCV: 85.8 fL (ref 80.0–100.0)
Monocytes Absolute: 0.4 10*3/uL (ref 0.1–1.0)
Monocytes Relative: 6 %
Neutro Abs: 5.3 10*3/uL (ref 1.7–7.7)
Neutrophils Relative %: 71 %
Platelets: 146 10*3/uL — ABNORMAL LOW (ref 150–400)
RBC: 3.18 MIL/uL — ABNORMAL LOW (ref 4.22–5.81)
RDW: 18.2 % — ABNORMAL HIGH (ref 11.5–15.5)
WBC: 7.3 10*3/uL (ref 4.0–10.5)
nRBC: 0 % (ref 0.0–0.2)

## 2022-06-29 LAB — GLUCOSE, CAPILLARY
Glucose-Capillary: 110 mg/dL — ABNORMAL HIGH (ref 70–99)
Glucose-Capillary: 116 mg/dL — ABNORMAL HIGH (ref 70–99)
Glucose-Capillary: 123 mg/dL — ABNORMAL HIGH (ref 70–99)
Glucose-Capillary: 127 mg/dL — ABNORMAL HIGH (ref 70–99)

## 2022-06-29 LAB — PHOSPHORUS: Phosphorus: 3.3 mg/dL (ref 2.5–4.6)

## 2022-06-29 LAB — MAGNESIUM: Magnesium: 2 mg/dL (ref 1.7–2.4)

## 2022-06-29 MED ORDER — TRACE MINERALS CU-MN-SE-ZN 300-55-60-3000 MCG/ML IV SOLN
INTRAVENOUS | Status: AC
  Filled 2022-06-29: qty 684

## 2022-06-29 NOTE — Progress Notes (Signed)
PHARMACY - TOTAL PARENTERAL NUTRITION CONSULT NOTE  Indication: chronic ileus in the setting of ALS   Patient Measurements: Height: '5\' 5"'$  (165.1 cm) Weight: 55.5 kg (122 lb 5.7 oz) IBW/kg (Calculated) : 61.5 TPN AdjBW (KG): 57.3 Body mass index is 20.36 kg/m. Usual Weight: 120- 130 lbs  Assessment:  51 yo man on TPN through [facility] prior to admission for chronic ileus related to ALS, now admitted with ileus. Patient also has a PEG tube that is used for medication administration. Pharmacy consulted to resume TPN.  Glucose / Insulin: no hx DM, A1C 5.4% - CBGs < 180 Used 3 units SSI last 24 hrs Electrolytes: all WNL Renal: SCr < 0.3, BUN WNL - stable Hepatic: ALT slightly elev (50), AST/ Tbili / TG WNL, albumin 2.9 Intake / Output; MIVF: UOP 1.57m/kg/hr, LBM 12/25, net +7.2L GI Imaging: 12/19 KUB: chronic ileus gas pattern 12/25 KUB: nonspecific bowel gas pattern w/ gaseous distension of small and large bowel. No definite change from prior study GI Surgeries / Procedures: none  Central access: implanted port 07/06/19  TPN start date: PTA  Nutritional Goals:  RD Estimated Needs Total Energy Estimated Needs: 1800-1900 Total Protein Estimated Needs: 95-105 grams Total Fluid Estimated Needs: >1.8 L/day  Home TPN formula (Added to media files): -2000 ml over 24 hours, rate of 83.3 ml/hr -100 g protein, 250 g dextrose, 56 g Smof lipids, 1809 Kcal -NaCl 10 mEq, NaAcet 60 mEq, Kcl 16 mEq, Kphos 30 mmol, Mg 10 mEq, CaGluc 9 mEq MVI, Trace elements   Current Nutrition:  NPO + TPN  Plan:  Patient is up +7.2 L this admit w/+1 edema noted, will reduce TPN rate/hr and concentrate TPN to still meet nutritional goals Reduce TPN to 75 mL/hr and concentrate to provide 102g AA and 1843 kCal, meeting 100% of estimated needs Electrolytes in TPN (adj to keep overall amounts approx the same): Na 1058m/L, Mg 1554mL (max in TPN), K 59m57m,Ca 4mEq28m Phos 20mmo6m Cl:Ac 1:2 Add standard MVI and  trace elements to TPN  Continue sensitive SSI Q6H - consider stopping if minimally used Monitor fluid status Monitor TPN labs Mon/Thurs and as needed  Mary-ADimple NanasmD, BCPS 06/29/2022 7:26 AM

## 2022-06-29 NOTE — Progress Notes (Addendum)
Jerry [name] WUJ:[id number] DOB: 1971-03-28 DOA: 06/22/2022 PCP: [name], MD   Subj:  51 y.o. BM PMHx  ALS with chronic tracheostomy/ventilator-dependent hypoxic respiratory failure, autonomic dysfunction, and ileus on TPN   Presents to the emergency department with fever.   Patient was discharged home from [facility] 1 week ago and is being cared for primarily by his wife with home health RN.  His wife was having difficulty detaching TPN tubing from his PICC and called home health RN for assistance.  Home health RN reports a fever of 100.5 F and abdominal distention.  He was sent to the ED for evaluation of this.     ED Course: Upon arrival to the ED, patient is found to be afebrile and saturating well on his usual vent settings with systolic blood pressure of 91 and greater.  EKG demonstrates sinus rhythm, chest x-ray notable for some chronic findings but no acute cardiopulmonary disease, and KUB with no significant change in ileus or G-tube placement.  Labs notable for potassium 3.4, bicarbonate 19, alkaline phosphatase 133, AST 66, hemoglobin 10.7, lactic acid 0.9, and negative COVID/influenza/RSV PCR.   PCCM was consulted by the ED physician, evaluated the patient in the emergency department, and recommends observation on the hospitalist service given patient's increased risk of decompensation.    Obj: 12/26 afebrile overnight A/O x 4, no complaints    Objective: VITAL SIGNS: Temp: 98.2 F (36.8 C) (12/26 0732) Temp Source: Axillary (12/26 0732) BP: 102/63 (12/26 0700) Pulse Rate: 69 (12/26 0700)   Vent settings 12/26 Mode PRVC Vt Set 500 ml Set rate 16 FiO2 30% PEEP 5 SpO2 97%    Intake/Output Summary (Last 24 hours) at 06/29/2022 0858 Last data filed at 06/29/2022 0700 Gross per 24 hour  Intake 2334.48 ml  Output 1675 ml  Net 659.48 ml    Physical Exam:  General: A/O x 4, positive chronic respiratory distress.  On chronic vent Eyes: negative scleral  hemorrhage, negative anisocoria, negative icterus ENT: Negative Runny nose, negative gingival bleeding, Neck:  Negative scars, masses, torticollis, lymphadenopathy, JVD, #8 cuffed trach in place negative sign of infection Lungs: Clear to auscultation bilaterally without wheezes or crackles Cardiovascular: Regular rate and rhythm without murmur gallop or rub normal S1 and S2 Abdomen: negative abdominal pain, nondistended, positive soft, bowel sounds, no rebound, no ascites, no appreciable mass Extremities: No significant cyanosis, clubbing, or edema bilateral lower extremities Skin: Negative rashes, lesions, ulcers Psychiatric:  Negative depression, negative anxiety, negative fatigue, negative mania  Central nervous system: Nods yes and no to questions and uses his computer eye sensitive.  Paralyzed from neck down      Mobility Assessment (last 72 hours)     Mobility Assessment   No documentation.             DVT prophylaxis: Lovenox Code Status: Full Family Communication: 12/26 called wife (Ms. Carlota Raspberry) and updated her on plan of care and lab results.   Status is: Inpatient    Dispo: The patient is from: Home              Anticipated d/c is to: Home              Anticipated d/c date is: 3 days              Patient currently is not medically stable to d/c.    Procedures/Significant Events: On admission: Right 3 lumen PICC line On admission RIGHT chest wall Port-A-Cath placed 2021 [facility].  Per  RN Donnella Sham ports nonfunctional 12/19 PEG tube replaced new 24 Fr Enfit hub-type gastrostomy tube 12/21 right arm PICC line removed and tip sent to culture..   Consultants:  PCCM   Cultures 12/19 sputum Gram positive Pseudomonas aeruginosa resistant to Zosyn:  12/19 blood LEFT AC x 2 negative final 12/20 urine positive multiple species 12/20 respiratory virus panel negative 12/21 cath tip NGTD 12/23 urine negative   Antimicrobials: Anti-infectives (From  admission, onward)    Start     Ordered Stop   06/25/22 1500  ceFEPIme (MAXIPIME) 2 g in sodium chloride 0.9 % 100 mL IVPB        06/25/22 1405 07/02/22 2359   06/24/22 1400  piperacillin-tazobactam (ZOSYN) IVPB 3.375 g  Status:  Discontinued        06/24/22 1049 06/25/22 1405   06/24/22 1145  doxycycline (VIBRAMYCIN) 100 mg in sodium chloride 0.9 % 250 mL IVPB  Status:  Discontinued        06/24/22 1049 06/25/22 1114           Assessment & Plan: Covid vaccination;   Principal Problem:   Fever Active Problems:   ALS (amyotrophic lateral sclerosis) (HCC)   Autonomic dysfunction   Ventilator dependent ([place])   Tracheostomy dependent (HCC)   Ileus (HCC)   Chronic respiratory failure with hypoxia (HCC)   Tracheostomy dependence ([place])   Chronic respiratory failure with hypoxia and hypercapnia (HCC)    Fever  - Presents with fever 100.5 F at home  - Afebrile here with normal lactate, no SIRS criteria or evidence for acute infection on ED workup, COVID/Flu/RSV pcr negative   - Sputum culture collected in ED, will also culture blood and urine, follow clinical course - 12/19 PEG tube changed -12/20 consult IV team to remove partially functioning PICC line will send for culture. -12/20 respiratory virus panel pending -12/20 PCXR pending -12/20 stain positive for gram-positive cocci in pairs, will hold antibiotics at this time negative leukocytosis, negative fever, but given patient's condition short fused for starting empiric antibiotics. - 12/21 sputum positive gram-negative rods (Haemophilus influenzae, Bordetella pertussis, Legionella pneumophila, and Acinetobacter baumannii?) -12/21 discussed case with pharmacy they concur that best to empirically start patient on antibiotics given his immunocompromise state. -12/25 blood cultures and cath tip NGTD if they remain so overnight will replace patient's PICC line -12/26 consult placed for PICC placement in a.m. cultures have remained  negative  Pseudomonas pneumonia - 12/25 sputum was positive for drug-resistant Pseudomonas.  Patient did not exhibit other signs and symptoms i.e. negative leukocytosis, however with treatment signs and symptoms have cleared. -12/26 complete 7-day course antibiotics  Chronic hypoxic respiratory failure  - Stable on home vent settings, appreciate PCCM involvement    -12/20 stable on vent settings.  See vent settings above -12/22 trach change #8 cuffed - 12/22 stable on current vent settings however patient has thick yellow sticky sputum C/W Pseudomonas.  Will consider starting hypertonic saline nebulizer if patient continues to have significant mucus plugging. -12/25 with change of trach and treatment for Pseudomonas pneumonia patient plugging has resolved.   ALS  - Continue trach and PEG care, consult palliative care    Chronic ileus  -12/19 IR replaced PEG -12/20 per wife chronic has been there since [facility], prior to going home.  Receives no food/drink except for medication through PEG. -12/20 per wife [facility] to [facility] no training given for accessing PEG tube to place on intermittent suction. -12/21 RN  Sarah to give instructions to wife when she comes to hospital today on how to look up patient's PEG tube to home suction, in order to provide intermittent suction when required. - 12/24 schedule patient for KUB to reevaluate ileus on 12/25 -12/25 KUB, patient with continued chronic ileus (my read) -12/26 radiology concurs no change chronic ileus   Autonomic dysfunction  - Normal HR and BP in ED, continue to monitor    Hypoglycemia - 12/20 D50 1 amp - 12/20 D5-0.45% saline 73m/hr -12/21 DC D5-0.45% saline -12/21 normal saline 780mhr discontinued    Hypokalemia - Potassium goal>4 - 12/23 K-Phos 15 mmol  Hypophosphatemia - Phosphorus goal> 2.5 - 12/23 slightly low see hypokalemia.  Pressure injury stage I Pressure Injury 12/26/21 Ear Right  Stage 1 -  Intact skin with non-blanchable redness of a localized area usually over a bony prominence. (Active)  12/26/21 2000  Location: Ear  Location Orientation: Right  Staging: Stage 1 -  Intact skin with non-blanchable redness of a localized area usually over a bony prominence.  Wound Description (Comments):   Present on Admission:    Anemia, Cit[place] -12/21 most likely anemia of chronic disease however will verify. - 12/21 anemia panel consistent with normocytic anemia - 12/21 occult blood pending Lab Results  Component Value Date   HGB 8.5 (L) 06/29/2022   HGB 8.6 (L) 06/28/2022   HGB 8.2 (L) 06/27/2022   HGB 8.4 (L) 06/26/2022   HGB 8.9 (L) 06/25/2022  -Relatively stable.  Protein calorie malnutrition - 12/20 patient did not receive his TPN today, unknown reason. -12/20 [place] never placed, therefore patient did not receive his TPN.  Will start TPN on 12/21 -12/20 2x ports  PICC line nonfunctional.  Consult IV team for replacement of PICC line with 2 Lumens. -12/20 IV team order:Access patient's right chest wall port ensure that its functional.  Then remove patient's PICC line and send tip for lab for culture - 12/21 pharmacy restarting patient's home TPN -2000 ml over 24 hours, rate of 83.3 ml/hr -100 g protein, 250 g dextrose, 56 g Smof lipids, 1809 Kcal -NaCl 10 mEq, NaAcet 60 mEq, Kcl 16 mEq, Kphos 30 mmol, Mg 10 mEq, CaGluc 9 mEq MVI, Trace elements -12/23 continue TPN per pharmacy    Goals of care - 12/20 spoke with RN [place] understands complexity of patient's medical problems.  Will attempt to place patient either and 71M or 4NP, or another appropriate unit.       Mobility Assessment (last 72 hours)     Mobility Assessment   No documentation.           Time: 50 minutes         Care during the described time interval was provided by me .  I have reviewed this patient's available data, including medical  history, events of note, physical examination, and all test results as part of my evaluation.

## 2022-06-30 DIAGNOSIS — R509 Fever, unspecified: Secondary | ICD-10-CM | POA: Diagnosis not present

## 2022-06-30 LAB — COMPREHENSIVE METABOLIC PANEL
ALT: 56 U/L — ABNORMAL HIGH (ref 0–44)
AST: 32 U/L (ref 15–41)
Albumin: 3 g/dL — ABNORMAL LOW (ref 3.5–5.0)
Alkaline Phosphatase: 97 U/L (ref 38–126)
Anion gap: 10 (ref 5–15)
BUN: 15 mg/dL (ref 6–20)
CO2: 25 mmol/L (ref 22–32)
Calcium: 9.3 mg/dL (ref 8.9–10.3)
Chloride: 104 mmol/L (ref 98–111)
Creatinine, Ser: <0.3 mg/dL — ABNORMAL LOW (ref 0.61–1.24)
Glucose, Bld: 129 mg/dL — ABNORMAL HIGH (ref 70–99)
Potassium: 4 mmol/L (ref 3.5–5.1)
Sodium: 139 mmol/L (ref 135–145)
Total Bilirubin: 0.3 mg/dL (ref 0.3–1.2)
Total Protein: 7.1 g/dL (ref 6.5–8.1)

## 2022-06-30 LAB — GLUCOSE, CAPILLARY
Glucose-Capillary: 126 mg/dL — ABNORMAL HIGH (ref 70–99)
Glucose-Capillary: 128 mg/dL — ABNORMAL HIGH (ref 70–99)

## 2022-06-30 LAB — CBC WITH DIFFERENTIAL/PLATELET
Abs Immature Granulocytes: 0.03 10*3/uL (ref 0.00–0.07)
Basophils Absolute: 0 10*3/uL (ref 0.0–0.1)
Basophils Relative: 0 %
Eosinophils Absolute: 0.3 10*3/uL (ref 0.0–0.5)
Eosinophils Relative: 4 %
HCT: 28 % — ABNORMAL LOW (ref 39.0–52.0)
Hemoglobin: 9.1 g/dL — ABNORMAL LOW (ref 13.0–17.0)
Immature Granulocytes: 0 %
Lymphocytes Relative: 25 %
Lymphs Abs: 1.7 10*3/uL (ref 0.7–4.0)
MCH: 27.1 pg (ref 26.0–34.0)
MCHC: 32.5 g/dL (ref 30.0–36.0)
MCV: 83.3 fL (ref 80.0–100.0)
Monocytes Absolute: 0.4 10*3/uL (ref 0.1–1.0)
Monocytes Relative: 7 %
Neutro Abs: 4.3 10*3/uL (ref 1.7–7.7)
Neutrophils Relative %: 64 %
Platelets: 177 10*3/uL (ref 150–400)
RBC: 3.36 MIL/uL — ABNORMAL LOW (ref 4.22–5.81)
RDW: 18.4 % — ABNORMAL HIGH (ref 11.5–15.5)
WBC: 6.8 10*3/uL (ref 4.0–10.5)
nRBC: 0 % (ref 0.0–0.2)

## 2022-06-30 LAB — PHOSPHORUS: Phosphorus: 3.3 mg/dL (ref 2.5–4.6)

## 2022-06-30 LAB — MAGNESIUM: Magnesium: 2.1 mg/dL (ref 1.7–2.4)

## 2022-06-30 MED ORDER — ALBUTEROL SULFATE (2.5 MG/3ML) 0.083% IN NEBU
2.5000 mg | INHALATION_SOLUTION | RESPIRATORY_TRACT | Status: AC | PRN
  Administered 2022-06-30: 2.5 mg via RESPIRATORY_TRACT
  Filled 2022-06-30: qty 3

## 2022-06-30 MED ORDER — HEPARIN SOD (PORK) LOCK FLUSH 100 UNIT/ML IV SOLN
500.0000 [IU] | INTRAVENOUS | Status: DC
  Administered 2022-06-30: 500 [IU]

## 2022-06-30 MED ORDER — SODIUM CHLORIDE 0.9% FLUSH
10.0000 mL | Freq: Two times a day (BID) | INTRAVENOUS | Status: DC
  Administered 2022-06-30 – 2022-07-01 (×2): 10 mL

## 2022-06-30 MED ORDER — HEPARIN SOD (PORK) LOCK FLUSH 100 UNIT/ML IV SOLN
500.0000 [IU] | INTRAVENOUS | Status: DC | PRN
  Administered 2022-06-30: 500 [IU]

## 2022-06-30 MED ORDER — SODIUM CHLORIDE 0.9% FLUSH: 10.0000 mL | INTRAVENOUS | Status: DC | PRN

## 2022-06-30 MED ORDER — TRACE MINERALS CU-MN-SE-ZN 300-55-60-3000 MCG/ML IV SOLN
INTRAVENOUS | Status: AC
  Filled 2022-06-30: qty 684

## 2022-06-30 NOTE — TOC Progression Note (Addendum)
Transition of Care [facility]) - Progression Note    Patient Details  Name: Jerry [name] MRN: [id number] Date of Birth: 1971/02/26  [facility] CM/SW Contact  Graves-Bigelow, Ocie Cornfield, RN Phone Number: 06/30/2022, 1:46 PM  Clinical Narrative: Patient was discussed in morning rounds and the plan is for the patient to d/c home on Friday. Case Manager did contact spouse and she has been educated on trach care and TPN administration. Patient is active with [facility] for TPN and [facility] supplies the TPN. [facility] can mix the TPN by Friday for home support. Spouse states that the patient is supposed to be receiving PDN from [facility]. Case Manager called Earnest Rosier @ [phone]. [facility] states the patient has been authorized for 112 hours a week. However, [facility] has not had any staffing to support the patient and the agency is willing to gradually staff the patient. Rob called Case Manager back and states that [facility] can staff the patient this Friday only open day is Saturday and can staff Again Sunday and Monday, then they will discuss the other days with souse. [facility] can have someone at the home around 1000-1100 am for services Friday 07-02-22. Case Manager did report the above information to MD that if the patient can have everything ready for d/c at 1000 and PTAR can be arranged for pick up at 1000 if this is feasible. Case Manager unable to relay the information to spouse-voicemail is full at the time of the call. Case Manager will continue follow for transition of care needs as the patient progresses.   1530 06-30-22 Spouse did call Case Manager back and she is agreeable to having the patient ready for transport at 1000 for home. Covering Case Manager will contact spouse on tomorrow for further updates.   Expected Discharge Plan: [place] Barriers to Discharge: Continued Medical Work up  Expected Discharge Plan and Services   Discharge Planning Services: CM  Consult Post Acute Care Choice: [place] arrangements for the past 2 months: [place]: RN [place] Agency: [facility] [place], [place] Date [place]: 06/25/22 Time [place]: [place] Representative spoke with at [place]: [place] Determinants of Health ([place]) Interventions SDOH Screenings   Food Insecurity: No Food Insecurity (01/21/2022)  Housing: Low Risk  (09/04/2020)  Transportation Needs: No Transportation Needs (01/21/2022)  Alcohol Screen: Low Risk  (09/04/2020)  Depression (PHQ2-9): Low Risk  (11/13/2021)  Financial Resource Strain: Low Risk  (09/04/2020)  Physical Activity: Inactive (09/04/2020)  Social Connections: Moderately Isolated (09/04/2020)  Stress: No Stress Concern Present (09/04/2020)  Tobacco Use: Low Risk  (06/22/2022)    Readmission Risk Interventions     No data to display

## 2022-06-30 NOTE — Progress Notes (Signed)
Spoke with Rolland Bimler RN regarding PICC order. Waiting for clarification from Dr. Florene Glen if PICC still needed since patient has a double implanted port (port-a-cath)

## 2022-06-30 NOTE — Progress Notes (Signed)
[facility] Progress Note Patient Name: Kaian Fahs DOB: 05-Nov-1970 MRN: [id number]   Date of Service  06/30/2022  HPI/Events of Note  Patient is having some wheezing per RT, he is not in overt  respiratory distress.  eICU Interventions  PRN Albuterol nebs ordered.        Kerry Kass Khaniyah Bezek 06/30/2022, 12:44 AM

## 2022-06-30 NOTE — Progress Notes (Signed)
   NAME:  Jerry [name], MRN:  [id number], DOB:  1971-05-13, LOS: 6 ADMISSION DATE:  06/22/2022, CONSULTATION DATE:  06/22/22 REFERRING MD:  edp, CHIEF COMPLAINT:  fever   History of Present Illness:  51 yo male with pmh ALSand secondary chronic hypoxic resp failure. He presented to ED today after being home for 7 days with wife (primary care giver) after lengthy stay in [facility] [place]. She was having difficulty detaching the tpn tubing and called home health RN who came and documented a fever and distended abdomen with history of ileus. No other symptoms noted at home and per wife has been stable since discharge from facility.   Upon arrival to ED appears that the peg tube was unusable and ileus was indeed present on imaging. He was documented again to have fever and also complaining of leak around his trach despite getting adequate tidal volume and oxygen sat being 100% on fio2 28% as well as appearing comfortable.   At this time is seems caregiver would prefer observation in hosptial with fever and to ensure resolution of ileus (if possible) as she gives his medication via peg tube. I tend to agree observation for following of pan cx would be prudent in this patient who has an increased risk of decompensation. Continue to discuss palliative/hospice options with pt and family as his long term prognosis is poor esp in setting of inability to have enteral  nutrition now.    Pertinent  Medical History  Als, advanced Chronic hypoxic resp failure Chronic ileus  Significant Hospital Events: Including procedures, antibiotic start and stop dates in addition to other pertinent events   12/19 admit for fever  Interim History / Subjective:  No acute events overnight remains on home vent settings Sputum culture growing pseudomonas.   Objective   Blood pressure 124/72, pulse 83, temperature 98.2 F (36.8 C), temperature source Oral, resp. rate 16, height '5\' 5"'$  (1.651 m), weight 60.5 kg, SpO2 99 %.    Vent  Mode: PRVC FiO2 (%):  [30 %] 30 % Set Rate:  [16 bmp] 16 bmp Vt Set:  [500 mL] 500 mL PEEP:  [5 cmH20] 5 cmH20 Plateau Pressure:  [17 cmH20-24 cmH20] 24 cmH20   Intake/Output Summary (Last 24 hours) at 06/30/2022 1050 Last data filed at 06/30/2022 0800 Gross per 24 hour  Intake 2273.85 ml  Output 1250 ml  Net 1023.85 ml   Filed Weights   06/28/22 0359 06/29/22 0425 06/30/22 0500  Weight: 56.9 kg 55.5 kg 60.5 kg    Examination: General 51 year old male resting on vent HENT trach midline Pulm dec bases Card rrr Abd soft Ext warm  Neuro open eyes. blinks  Resolved Hospital Problem list     Assessment & Plan:   Chronic hypoxemic respiratory failure secondary to ALS Pseudomonas pneumonia vs colonization.  Tracheostomy status chronic ileus: TPN protein calorie malnutrition TPN Anemia  Discussion Completed abx Stable on vent Not weanable  Plan Cont full vent support Cont routine trach care VAP bundle    Best Practice (right click and "Reselect all SmartList Selections" daily)   Diet/type: NPO DVT prophylaxis: other GI prophylaxis: N/A Lines: yes and it is still needed Foley:  N/A Code Status:  full code Last date of multidisciplinary goals of care discussion [per primary]

## 2022-06-30 NOTE — Progress Notes (Signed)
PHARMACY - TOTAL PARENTERAL NUTRITION CONSULT NOTE  Indication: chronic ileus in the setting of ALS   Patient Measurements: Height: '5\' 5"'$  (165.1 cm) Weight: 60.5 kg (133 lb 6.1 oz) IBW/kg (Calculated) : 61.5 TPN AdjBW (KG): 57.3 Body mass index is 22.2 kg/m. Usual Weight: 120- 130 lbs  Assessment:  51 yo man on TPN through [facility] prior to admission for chronic ileus related to ALS, now admitted with ileus. Patient also has a PEG tube that is used for medication administration. Pharmacy consulted to resume TPN.  Glucose / Insulin: no hx DM, A1C 5.4% - CBGs < 130 Used 2 units SSI last 24 hrs Electrolytes: all WNL Renal: SCr < 0.3, BUN WNL - stable Hepatic: ALT slightly elev (56), AST/ Tbili / TG WNL, albumin 3 Intake / Output; MIVF: UOP 0.7 ml/kg/hr, LBM 12/25, net +8.6L GI Imaging: 12/19 KUB: chronic ileus gas pattern 12/25 KUB: nonspecific bowel gas pattern w/ gaseous distension of small and large bowel. No definite change from prior study GI Surgeries / Procedures: none  Central access: implanted port 07/06/19  TPN start date: PTA  Nutritional Goals:  RD Estimated Needs Total Energy Estimated Needs: 1800-1900 Total Protein Estimated Needs: 95-105 grams Total Fluid Estimated Needs: >1.8 L/day  Home TPN formula (Added to media files): -2000 ml over 24 hours, rate of 83.3 ml/hr -100 g protein, 250 g dextrose, 56 g Smof lipids, 1809 Kcal -NaCl 10 mEq, NaAcet 60 mEq, Kcl 16 mEq, Kphos 30 mmol, Mg 10 mEq, CaGluc 9 mEq MVI, Trace elements   Current Nutrition:  NPO + TPN  Plan:  Reduce TPN to 75 mL/hr and concentrate to provide 102g AA and 1843 kCal, meeting 100% of estimated needs Electrolytes in TPN: Na 157mq/L, Mg 157m/L (max in TPN), K 6550mL,Ca 4mE76m, Phos 20mm76m, Cl:Ac 1:2 Add standard MVI and trace elements to TPN  Discontinue CBG checks as CBGs well controlled with minimal SSI used (no hx DM) Monitor fluid status Monitor TPN labs Mon/Thurs and as  needed  Mary-Dimple NanasrmD, BCPS 06/30/2022 8:25 AM

## 2022-06-30 NOTE — Progress Notes (Signed)
PROGRESS NOTE    Jerry [name]  OIB:[id number] DOB: [birth year] DOA: 06/22/2022 PCP: Jerry Sizer, MD  Chief Complaint  Patient presents with   Vascular Access Problem    Brief Narrative:  51 y.o. BM PMHx  ALS with chronic tracheostomy/ventilator-dependent hypoxic respiratory failure, autonomic dysfunction, and ileus on TPN    Presents to the emergency department with fever.   Patient was discharged home from [facility] 1 week ago and is being cared for primarily by his wife with home health RN.  His wife was having difficulty detaching TPN tubing from his PICC and called home health RN for assistance.  Home health RN reports Jerry [name] fever of 100.5 F and abdominal distention.  He was sent to the ED for evaluation of this.     ED Course: Upon arrival to the ED, patient is found to be afebrile and saturating well on his usual vent settings with systolic blood pressure of 91 and greater.  EKG demonstrates sinus rhythm, chest x-ray notable for some chronic findings but no acute cardiopulmonary disease, and KUB with no significant change in ileus or G-tube placement.  Labs notable for potassium 3.4, bicarbonate 19, alkaline phosphatase 133, AST 66, hemoglobin 10.7, lactic acid 0.9, and negative COVID/influenza/RSV PCR.   PCCM was consulted by the ED physician, evaluated the patient in the emergency department, and recommends observation on the hospitalist service given patient's increased risk of decompensation.   Assessment & Plan:   Principal Problem:   Fever Active Problems:   ALS (amyotrophic lateral sclerosis) (HCC)   Autonomic dysfunction   Ventilator dependent (HCC)   Tracheostomy dependent (HCC)   Ileus (HCC)   Chronic respiratory failure with hypoxia (HCC)   Tracheostomy dependence (HCC)   Chronic respiratory failure with hypoxia and hypercapnia (HCC)  Pseudomonas pneumonia  Fever 12/25 sputum was positive for drug-resistant Pseudomonas.  Patient did not exhibit other signs and  symptoms i.e. negative leukocytosis, however with treatment signs and symptoms have cleared. Cefepime 12/22 - present.  Plan for 7 days. Blood cx 12/19 NGTDx5 Negative RVP Negative cath tip culture Urine cx with multiple species, repeat no growth   Chronic hypoxic respiratory failure  Stable on home vent settings, appreciate PCCM involvement  12/22 had trach change, #8 cuffed    ALS  Continue trach and PEG care, consult palliative care    Chronic ileus  Hasn't tolerated tube feeds via peg since August Receives only medicines via PEG Continue TPN-12/19 IR replaced PEG   Autonomic dysfunction  Vitals currently stable   Hypoglycemia Treat for hypoglycemia A1c 12/2021 was 5.4  Hypokalemia improved   Hypophosphatemia improved  Anemia Appears c/w AOCD Trend, relatively stable  Protein calorie malnutrition PICC replaced 12/27 for TPN Per discussion with his wife, hasn't been able to tolerate tube feeds since August due to the chronic ileus Continue TPN   Stage 1 Pressure Injury Frequent turns     DVT prophylaxis: lovenox Code Status: full Family Communication: wife over phone Disposition:   Status is: Inpatient Remains inpatient appropriate because: continued need for inpatient care, need to arrange safe d/c plan with need for home vent   Consultants:  IR PCCM  Procedures:  12/19 IMPRESSION: Successful bedside replacement of Jerry [name] new 24 Fr Enfit hub-type gastrostomy tube.   The new gastrostomy tube is ready for immediate use.  Antimicrobials:  Anti-infectives (From admission, onward)    Start     Dose/Rate Route Frequency Ordered Stop   06/25/22 1500  ceFEPIme (MAXIPIME) 2 g in sodium  chloride 0.9 % 100 mL IVPB        2 g 200 mL/hr over 30 Minutes Intravenous Every 8 hours 06/25/22 1405 07/01/22 2359   06/24/22 1400  piperacillin-tazobactam (ZOSYN) IVPB 3.375 g  Status:  Discontinued        3.375 g 12.5 mL/hr over 240 Minutes Intravenous Every 8 hours  06/24/22 1049 06/25/22 1405   06/24/22 1145  doxycycline (VIBRAMYCIN) 100 mg in sodium chloride 0.9 % 250 mL IVPB  Status:  Discontinued        100 mg 125 mL/hr over 120 Minutes Intravenous Every 12 hours 06/24/22 1049 06/25/22 1114       Subjective: No new complaints  Objective: Vitals:   06/30/22 1900 06/30/22 1935 06/30/22 1938 06/30/22 2000  BP: 137/85   130/88  Pulse: 76   75  Resp: 18   16  Temp:   98.7 F (37.1 C)   TempSrc:   Oral   SpO2: 98%   99%  Weight:  61.1 kg    Height:        Intake/Output Summary (Last 24 hours) at 06/30/2022 2013 Last data filed at 06/30/2022 1900 Gross per 24 hour  Intake 2210.07 ml  Output 2700 ml  Net -489.93 ml   Filed Weights   06/29/22 0425 06/30/22 0500 06/30/22 1935  Weight: 55.5 kg 60.5 kg 61.1 kg    Examination:  General exam: Appears calm and comfortable  Respiratory system: unlabored Cardiovascular system: RRR Gastrointestinal system: Abdomen is nondistended, soft and nontender. PEG Central nervous system: Alert and oriented. Communicates via tablet. Extremities:no LEE   Data Reviewed: I have personally reviewed following labs and imaging studies  CBC: Recent Labs  Lab 06/26/22 0416 06/27/22 0454 06/28/22 0349 06/29/22 0419 06/30/22 0534  WBC 5.7 4.4 5.8 7.3 6.8  NEUTROABS 3.9 2.9 4.0 5.3 4.3  HGB 8.4* 8.2* 8.6* 8.5* 9.1*  HCT 27.1* 26.8* 27.6* 27.3* 28.0*  MCV 86.9 87.0 86.5 85.8 83.3  PLT 133* 143* 157 146* 914    Basic Metabolic Panel: Recent Labs  Lab 06/26/22 0416 06/27/22 0454 06/28/22 0349 06/29/22 0419 06/30/22 0534  NA 138 136 138 140 139  K 3.8 4.0 4.1 4.1 4.0  CL 111 106 107 107 104  CO2 '23 24 25 27 25  '$ GLUCOSE 126* 133* 134* 126* 129*  BUN '11 10 11 14 15  '$ CREATININE <0.30* <0.30* <0.30* <0.30* <0.30*  CALCIUM 8.8* 9.0 8.7* 9.0 9.3  MG 1.8 1.8 2.1 2.0 2.1  PHOS 2.2* 2.8 2.9 3.3 3.3    GFR: CrCl cannot be calculated (This lab value cannot be used to calculate CrCl because it  is not Jerry [name] number: <0.30).  Liver Function Tests: Recent Labs  Lab 06/26/22 0416 06/27/22 0454 06/28/22 0349 06/29/22 0419 06/30/22 0534  AST '20 26 28 30 '$ 32  ALT 47* 47* 52* 50* 56*  ALKPHOS 98 89 93 86 97  BILITOT 0.3 0.4 0.5 0.4 0.3  PROT 6.7 6.7 6.9 6.9 7.1  ALBUMIN 2.8* 2.7* 2.9* 2.9* 3.0*    CBG: Recent Labs  Lab 06/29/22 0637 06/29/22 1108 06/29/22 1823 06/30/22 0030 06/30/22 0456  GLUCAP 116* 127* 110* 126* 128*     Recent Results (from the past 240 hour(s))  Resp panel by RT-PCR (RSV, Flu Anina Schnake&B, Covid) Anterior Nasal Swab     Status: None   Collection Time: 06/22/22  5:39 AM   Specimen: Anterior Nasal Swab  Result Value Ref Range Status   SARS Coronavirus 2 by  RT PCR NEGATIVE NEGATIVE Final    Comment: (NOTE) SARS-CoV-2 target nucleic acids are NOT DETECTED.  The SARS-CoV-2 RNA is generally detectable in upper respiratory specimens during the acute phase of infection. The lowest concentration of SARS-CoV-2 viral copies this assay can detect is 138 copies/mL. Noam Franzen negative result does not preclude SARS-Cov-2 infection and should not be used as the sole basis for treatment or other patient management decisions. Sharron Petruska negative result may occur with  improper specimen collection/handling, submission of specimen other than nasopharyngeal swab, presence of viral mutation(s) within the areas targeted by this assay, and inadequate number of viral copies(<138 copies/mL). Priseis Cratty negative result must be combined with clinical observations, patient history, and epidemiological information. The expected result is Negative.  Fact Sheet for Patients:  EntrepreneurPulse.com.au  Fact Sheet for Healthcare Providers:  IncredibleEmployment.be  This test is no t yet approved or cleared by the [place] FDA and  has been authorized for detection and/or diagnosis of SARS-CoV-2 by FDA under an Emergency Use Authorization (EUA). This EUA will remain   in effect (meaning this test can be used) for the duration of the COVID-19 declaration under Section 564(b)(1) of the Act, 21 U.S.C.section 360bbb-3(b)(1), unless the authorization is terminated  or revoked sooner.       Influenza Clide Remmers by PCR NEGATIVE NEGATIVE Final   Influenza B by PCR NEGATIVE NEGATIVE Final    Comment: (NOTE) The Xpert Xpress SARS-CoV-2/FLU/RSV plus assay is intended as an aid in the diagnosis of influenza from Nasopharyngeal swab specimens and should not be used as Oneisha Ammons sole basis for treatment. Nasal washings and aspirates are unacceptable for Xpert Xpress SARS-CoV-2/FLU/RSV testing.  Fact Sheet for Patients: EntrepreneurPulse.com.au  Fact Sheet for Healthcare Providers: IncredibleEmployment.be  This test is not yet approved or cleared by the [place] FDA and has been authorized for detection and/or diagnosis of SARS-CoV-2 by FDA under an Emergency Use Authorization (EUA). This EUA will remain in effect (meaning this test can be used) for the duration of the COVID-19 declaration under Section 564(b)(1) of the Act, 21 U.S.C. section 360bbb-3(b)(1), unless the authorization is terminated or revoked.     Resp Syncytial Virus by PCR NEGATIVE NEGATIVE Final    Comment: (NOTE) Fact Sheet for Patients: EntrepreneurPulse.com.au  Fact Sheet for Healthcare Providers: IncredibleEmployment.be  This test is not yet approved or cleared by the [place] FDA and has been authorized for detection and/or diagnosis of SARS-CoV-2 by FDA under an Emergency Use Authorization (EUA). This EUA will remain in effect (meaning this test can be used) for the duration of the COVID-19 declaration under Section 564(b)(1) of the Act, 21 U.S.C. section 360bbb-3(b)(1), unless the authorization is terminated or revoked.  Performed at [facility], [place] [address] [place], [place] [address]    Expectorated Sputum Assessment w Gram Stain, Rflx to Resp Cult     Status: None   Collection Time: 06/22/22  6:05 AM   Specimen: Expectorated Sputum  Result Value Ref Range Status   Specimen Description EXPECTORATED SPUTUM  Final   Special Requests NONE  Final   Sputum evaluation   Final    THIS SPECIMEN IS ACCEPTABLE FOR SPUTUM CULTURE Performed at [facility], [place] [address], [place],  [address]    Report Status 06/22/2022 FINAL  Final  Culture, Respiratory w Gram Stain     Status: None   Collection Time: 06/22/22  6:05 AM  Result Value Ref Range Status   Specimen Description EXPECTORATED SPUTUM  Final   Special Requests NONE Reflexed from [id number]  Final   Gram Stain   Final    MODERATE WBC PRESENT,BOTH PMN AND MONONUCLEAR RARE GRAM NEGATIVE RODS RARE GRAM NEGATIVE DIPLOCOCCI IN BOTH AEROBIC AND ANAEROBIC BOTTLES GRAM POSITIVE COCCI IN PAIRS    Culture   Final    MODERATE PSEUDOMONAS AERUGINOSA Two isolates with different morphologies were identified as the same organism.The most resistant organism was reported. NO STAPHYLOCOCCUS AUREUS ISOLATED Performed at [facility], [place] [address], [place], [place] [address]    Report Status 06/27/2022 FINAL  Final   Organism ID, Bacteria PSEUDOMONAS AERUGINOSA  Final      Susceptibility   Pseudomonas aeruginosa - MIC*    CEFTAZIDIME 4 SENSITIVE Sensitive     CIPROFLOXACIN <=0.25 SENSITIVE Sensitive     GENTAMICIN <=1 SENSITIVE Sensitive     IMIPENEM 2 SENSITIVE Sensitive     PIP/TAZO >=128 RESISTANT Resistant     CEFEPIME 4 SENSITIVE Sensitive     * MODERATE PSEUDOMONAS AERUGINOSA  Culture, blood (Routine X 2) w Reflex to ID Panel     Status: None   Collection Time: 06/22/22  9:16 PM   Specimen: BLOOD  Result Value Ref Range Status   Specimen Description BLOOD LEFT ANTECUBITAL  Final   Special Requests   Final    BOTTLES DRAWN AEROBIC AND ANAEROBIC Blood Culture adequate volume   Culture   Final    NO  GROWTH 5 DAYS Performed at [facility], [place] [address] [place], [place] [address]    Report Status 06/28/2022 FINAL  Final  Culture, blood (Routine X 2) w Reflex to ID Panel     Status: None   Collection Time: 06/22/22 10:08 PM   Specimen: BLOOD  Result Value Ref Range Status   Specimen Description BLOOD LEFT ANTECUBITAL  Final   Special Requests   Final    BOTTLES DRAWN AEROBIC AND ANAEROBIC Blood Culture results may not be optimal due to an excessive volume of blood received in culture bottles   Culture   Final    NO GROWTH 5 DAYS Performed at [facility], [place] [address], [place], [place] [address]    Report Status 06/27/2022 FINAL  Final  Urine Culture     Status: Abnormal   Collection Time: 06/23/22  2:33 AM   Specimen: Urine, Catheterized  Result Value Ref Range Status   Specimen Description URINE, CATHETERIZED  Final   Special Requests   Final    NONE Performed at [facility], [place] [address], [place], [place] [address]    Culture MULTIPLE SPECIES PRESENT, SUGGEST RECOLLECTION (Brit Wernette)  Final   Report Status 06/24/2022 FINAL  Final  Respiratory (~20 pathogens) panel by PCR     Status: None   Collection Time: 06/23/22  9:54 PM   Specimen: Nasopharyngeal Swab; Respiratory  Result Value Ref Range Status   Adenovirus NOT DETECTED NOT DETECTED Final   Coronavirus 229E NOT DETECTED NOT DETECTED Final    Comment: (NOTE) The Coronavirus on the Respiratory Panel, DOES NOT test for the novel  Coronavirus (2019 nCoV)    Coronavirus HKU1 NOT DETECTED NOT DETECTED Final   Coronavirus NL63 NOT DETECTED NOT DETECTED Final   Coronavirus OC43 NOT DETECTED NOT DETECTED Final   Metapneumovirus NOT DETECTED NOT DETECTED Final   Rhinovirus / Enterovirus NOT DETECTED NOT DETECTED Final   Influenza Thi Klich NOT DETECTED NOT DETECTED Final   Influenza B NOT DETECTED NOT  DETECTED Final   Parainfluenza Virus 1 NOT DETECTED NOT DETECTED Final   Parainfluenza Virus 2 NOT DETECTED NOT  DETECTED Final   Parainfluenza Virus 3 NOT DETECTED NOT DETECTED Final   Parainfluenza Virus 4 NOT DETECTED NOT DETECTED Final   Respiratory Syncytial Virus NOT DETECTED NOT DETECTED Final   Bordetella pertussis NOT DETECTED NOT DETECTED Final   Bordetella Parapertussis NOT DETECTED NOT DETECTED Final   Chlamydophila pneumoniae NOT DETECTED NOT DETECTED Final   Mycoplasma pneumoniae NOT DETECTED NOT DETECTED Final    Comment: Performed at [facility], [place] [address] [place], [place] [address]  MRSA Next Gen by PCR, Nasal     Status: None   Collection Time: 06/24/22 12:36 AM   Specimen: Nasal Mucosa; Nasal Swab  Result Value Ref Range Status   MRSA by PCR Next Gen NOT DETECTED NOT DETECTED Final    Comment: (NOTE) The GeneXpert MRSA Assay (FDA approved for NASAL specimens only), is one component of Isbella Arline comprehensive MRSA colonization surveillance program. It is not intended to diagnose MRSA infection nor to guide or monitor treatment for MRSA infections. Test performance is not FDA approved in patients less than 73 years old. Performed at [facility], [place] [address], [place], [place] [address]   Cath Tip Culture     Status: None   Collection Time: 06/24/22  1:21 AM   Specimen: Catheter Tip; Other  Result Value Ref Range Status   Specimen Description CATH TIP  Final   Special Requests NONE  Final   Culture   Final    NO GROWTH 3 DAYS Performed at [facility], [place] [address], [place], [place] [address]    Report Status 06/27/2022 FINAL  Final  Urine Culture     Status: None   Collection Time: 06/26/22  4:52 PM   Specimen: Urine, Clean Catch  Result Value Ref Range Status   Specimen Description URINE, CLEAN CATCH  Final   Special Requests Immunocompromised  Final   Culture   Final    NO GROWTH Performed at [facility], [place] [address], [place],  [address]    Report Status 06/27/2022 FINAL  Final         Radiology Studies: [place] EKG SITE  RITE  Result Date: 06/29/2022 If Site Rite image not attached, placement could not be confirmed due to current cardiac rhythm.       Scheduled Meds:  Chlorhexidine Gluconate Cloth  6 each Topical Daily   enoxaparin (LOVENOX) injection  40 mg Subcutaneous Q24H   glycopyrrolate  0.1 mg Intravenous BID   heparin lock flush  500 Units Intracatheter Q30 days   mouth rinse  15 mL Mouth Rinse Q2H   scopolamine  1 patch Transdermal Q72H   sodium chloride flush  10-40 mL Intracatheter Q12H   sodium chloride flush  3 mL Intravenous Q12H   Continuous Infusions:  sodium chloride 75 mL/hr at 06/30/22 1600   ceFEPime (MAXIPIME) IV Stopped (06/30/22 1544)   TPN ADULT (ION) 75 mL/hr at 06/30/22 1842     LOS: 6 days    Time spent: over 30 min    Fayrene Helper, MD [facility]   To contact the attending provider between 7A-7P or the covering provider during after hours 7P-7A, please log into the web site [email or web address] and access using universal [place] password for that web site. If you do not have the password, please call the hospital operator.  06/30/2022, 8:13  PM    

## 2022-06-30 NOTE — Progress Notes (Signed)
Peripherally Inserted Central Catheter Placement  The IV Nurse has discussed with the patient and/or persons authorized to consent for the patient, the purpose of this procedure and the potential benefits and risks involved with this procedure.  The benefits include less needle sticks, lab draws from the catheter, and the patient may be discharged home with the catheter. Risks include, but not limited to, infection, bleeding, blood clot (thrombus formation), and puncture of an artery; nerve damage and irregular heartbeat and possibility to perform a PICC exchange if needed/ordered by physician.  Alternatives to this procedure were also discussed.  Bard Power PICC patient education guide, fact sheet on infection prevention and patient information card has been provided to patient /or left at bedside.    PICC Placement Documentation  PICC Double Lumen 06/30/22 Left Brachial 47 cm 0 cm (Active)  Indication for Insertion or Continuance of Line Administration of hyperosmolar/irritating solutions (i.e. TPN, Vancomycin, etc.) 06/30/22 1823  Exposed Catheter (cm) 0 cm 06/30/22 1823  Site Assessment Clean, Dry, Intact 06/30/22 1823  Lumen #1 Status Flushed;Saline locked;Blood return noted 06/30/22 1823  Lumen #2 Status Flushed;Saline locked;Blood return noted 06/30/22 1823  Dressing Type Transparent;Securing device 06/30/22 1823  Dressing Status Antimicrobial disc in place;Clean, Dry, Intact 06/30/22 1823  Safety Lock Not Applicable 42/59/56 3875  Line Adjustment (NICU/IV Team Only) No 06/30/22 1823  Dressing Intervention New dressing;Other (Comment) 06/30/22 1823  Dressing Change Due 07/07/22 06/30/22 1823       Enos Fling 06/30/2022, 6:24 PM

## 2022-07-01 DIAGNOSIS — R509 Fever, unspecified: Secondary | ICD-10-CM | POA: Diagnosis not present

## 2022-07-01 LAB — CBC WITH DIFFERENTIAL/PLATELET
Abs Immature Granulocytes: 0.06 10*3/uL (ref 0.00–0.07)
Basophils Absolute: 0 10*3/uL (ref 0.0–0.1)
Basophils Relative: 1 %
Eosinophils Absolute: 0.3 10*3/uL (ref 0.0–0.5)
Eosinophils Relative: 4 %
HCT: 29.4 % — ABNORMAL LOW (ref 39.0–52.0)
Hemoglobin: 9.4 g/dL — ABNORMAL LOW (ref 13.0–17.0)
Immature Granulocytes: 1 %
Lymphocytes Relative: 20 %
Lymphs Abs: 1.5 10*3/uL (ref 0.7–4.0)
MCH: 27.1 pg (ref 26.0–34.0)
MCHC: 32 g/dL (ref 30.0–36.0)
MCV: 84.7 fL (ref 80.0–100.0)
Monocytes Absolute: 0.6 10*3/uL (ref 0.1–1.0)
Monocytes Relative: 8 %
Neutro Abs: 5.1 10*3/uL (ref 1.7–7.7)
Neutrophils Relative %: 66 %
Platelets: 172 10*3/uL (ref 150–400)
RBC: 3.47 MIL/uL — ABNORMAL LOW (ref 4.22–5.81)
RDW: 18.9 % — ABNORMAL HIGH (ref 11.5–15.5)
WBC: 7.6 10*3/uL (ref 4.0–10.5)
nRBC: 0 % (ref 0.0–0.2)

## 2022-07-01 LAB — COMPREHENSIVE METABOLIC PANEL
ALT: 60 U/L — ABNORMAL HIGH (ref 0–44)
AST: 34 U/L (ref 15–41)
Albumin: 3.1 g/dL — ABNORMAL LOW (ref 3.5–5.0)
Alkaline Phosphatase: 96 U/L (ref 38–126)
Anion gap: 6 (ref 5–15)
BUN: 15 mg/dL (ref 6–20)
CO2: 25 mmol/L (ref 22–32)
Calcium: 9.3 mg/dL (ref 8.9–10.3)
Chloride: 112 mmol/L — ABNORMAL HIGH (ref 98–111)
Creatinine, Ser: <0.3 mg/dL — ABNORMAL LOW (ref 0.61–1.24)
Glucose, Bld: 119 mg/dL — ABNORMAL HIGH (ref 70–99)
Potassium: 3.7 mmol/L (ref 3.5–5.1)
Sodium: 143 mmol/L (ref 135–145)
Total Bilirubin: 0.6 mg/dL (ref 0.3–1.2)
Total Protein: 7.8 g/dL (ref 6.5–8.1)

## 2022-07-01 LAB — GLUCOSE, CAPILLARY
Glucose-Capillary: 115 mg/dL — ABNORMAL HIGH (ref 70–99)
Glucose-Capillary: 131 mg/dL — ABNORMAL HIGH (ref 70–99)

## 2022-07-01 LAB — TRIGLYCERIDES: Triglycerides: 52 mg/dL (ref <150)

## 2022-07-01 LAB — PHOSPHORUS: Phosphorus: 3.1 mg/dL (ref 2.5–4.6)

## 2022-07-01 LAB — MAGNESIUM: Magnesium: 2.2 mg/dL (ref 1.7–2.4)

## 2022-07-01 MED ORDER — TRAVASOL 10 % IV SOLN
INTRAVENOUS | Status: DC
  Filled 2022-07-01: qty 1017.5

## 2022-07-01 NOTE — Progress Notes (Signed)
PROGRESS NOTE    Jerry [name]  WUJ:[id number] DOB: Feb 22, 1971 DOA: 06/22/2022 PCP: [name], MD  Chief Complaint  Patient presents with   Vascular Access Problem    Brief Narrative:  51 y.o. BM PMHx  ALS with chronic tracheostomy/ventilator-dependent hypoxic respiratory failure, autonomic dysfunction, and ileus on TPN    Presents to the emergency department with fever.   Patient was discharged home from [place] 1 week ago and is being cared for primarily by his wife with home health RN.  His wife was having difficulty detaching TPN tubing from his PICC and called home health RN for assistance.  Home health RN reports Krissa Utke fever of 100.5 F and abdominal distention.  He was sent to the ED for evaluation of this.     ED Course: Upon arrival to the ED, patient is found to be afebrile and saturating well on his usual vent settings with systolic blood pressure of 91 and greater.  EKG demonstrates sinus rhythm, chest x-ray notable for some chronic findings but no acute cardiopulmonary disease, and KUB with no significant change in ileus or G-tube placement.  Labs notable for potassium 3.4, bicarbonate 19, alkaline phosphatase 133, AST 66, hemoglobin 10.7, lactic acid 0.9, and negative COVID/influenza/RSV PCR.   PCCM was consulted by the ED physician, evaluated the patient in the emergency department, and recommends observation on the hospitalist service given patient's increased risk of decompensation.   Assessment & Plan:   Principal Problem:   Fever Active Problems:   ALS (amyotrophic lateral sclerosis) (HCC)   Autonomic dysfunction   Ventilator dependent (HCC)   Tracheostomy dependent (HCC)   Ileus (HCC)   Chronic respiratory failure with hypoxia (HCC)   Tracheostomy dependence (HCC)   Chronic respiratory failure with hypoxia and hypercapnia (HCC)  Pseudomonas pneumonia  Fever 12/25 sputum was positive for drug-resistant Pseudomonas.  Patient did not exhibit other signs and  symptoms i.e. negative leukocytosis, however with treatment signs and symptoms have cleared. Cefepime 12/22 - present.  Plan for 7 days. Blood cx 12/19 NGTDx5 Negative RVP Negative cath tip culture Urine cx with multiple species, repeat no growth   Chronic hypoxic respiratory failure  Stable on home vent settings, appreciate PCCM involvement  12/22 had trach change, #8 cuffed  Slow cuff leak overnight 12/27-28 -> will discuss with pulm   ALS  Continue trach and PEG care, consult palliative care    Chronic ileus  Hasn't tolerated tube feeds via peg since August Receives only medicines via PEG Continue TPN-12/19 IR replaced PEG   Autonomic dysfunction  Vitals currently stable   Hypoglycemia Treat for hypoglycemia A1c 12/2021 was 5.4  Hypokalemia improved   Hypophosphatemia improved  Anemia Appears c/w AOCD Trend, relatively stable  Protein calorie malnutrition PICC replaced 12/27 for TPN Per discussion with his wife, hasn't been able to tolerate tube feeds since August due to the chronic ileus Continue TPN   Stage 1 Pressure Injury Frequent turns     DVT prophylaxis: lovenox Code Status: full Family Communication: wife over phone 12/27 (caregiver amy at bedside 12/28 AM) Disposition:   Status is: Inpatient Remains inpatient appropriate because: continued need for inpatient care, need to arrange safe d/c plan with need for home vent   Consultants:  IR PCCM  Procedures:  12/19 IMPRESSION: Successful bedside replacement of Elson Ulbrich new 24 Fr Enfit hub-type gastrostomy tube.   The new gastrostomy tube is ready for immediate use.  Antimicrobials:  Anti-infectives (From admission, onward)    Start  Dose/Rate Route Frequency Ordered Stop   06/25/22 1500  ceFEPIme (MAXIPIME) 2 g in sodium chloride 0.9 % 100 mL IVPB        2 g 200 mL/hr over 30 Minutes Intravenous Every 8 hours 06/25/22 1405 07/01/22 2359   06/24/22 1400  piperacillin-tazobactam (ZOSYN) IVPB  3.375 g  Status:  Discontinued        3.375 g 12.5 mL/hr over 240 Minutes Intravenous Every 8 hours 06/24/22 1049 06/25/22 1405   06/24/22 1145  doxycycline (VIBRAMYCIN) 100 mg in sodium chloride 0.9 % 250 mL IVPB  Status:  Discontinued        100 mg 125 mL/hr over 120 Minutes Intravenous Every 12 hours 06/24/22 1049 06/25/22 1114       Subjective: No complaints Caregiver amy at bedside  Objective: Vitals:   06/30/22 1900 06/30/22 1935 06/30/22 1938 06/30/22 2000  BP: 137/85   130/88  Pulse: 76   75  Resp: 18   16  Temp:   98.7 F (37.1 C)   TempSrc:   Oral   SpO2: 98%   99%  Weight:  61.1 kg    Height:        Intake/Output Summary (Last 24 hours) at 06/30/2022 2013 Last data filed at 06/30/2022 1900 Gross per 24 hour  Intake 2210.07 ml  Output 2700 ml  Net -489.93 ml   Filed Weights   06/29/22 0425 06/30/22 0500 06/30/22 1935  Weight: 55.5 kg 60.5 kg 61.1 kg    Examination:  General: No acute distress. Cardiovascular: RRR Lungs: Clear to auscultation bilaterally, vent, trach Abdomen: mildly distended, no TTP Neurological: Alert and oriented 3. Communicates via blinking and his tablet. Extremities: No clubbing or cyanosis. No edema.  Data Reviewed: I have personally reviewed following labs and imaging studies  CBC: Recent Labs  Lab 06/26/22 0416 06/27/22 0454 06/28/22 0349 06/29/22 0419 06/30/22 0534  WBC 5.7 4.4 5.8 7.3 6.8  NEUTROABS 3.9 2.9 4.0 5.3 4.3  HGB 8.4* 8.2* 8.6* 8.5* 9.1*  HCT 27.1* 26.8* 27.6* 27.3* 28.0*  MCV 86.9 87.0 86.5 85.8 83.3  PLT 133* 143* 157 146* 188    Basic Metabolic Panel: Recent Labs  Lab 06/26/22 0416 06/27/22 0454 06/28/22 0349 06/29/22 0419 06/30/22 0534  NA 138 136 138 140 139  K 3.8 4.0 4.1 4.1 4.0  CL 111 106 107 107 104  CO2 '23 24 25 27 25  '$ GLUCOSE 126* 133* 134* 126* 129*  BUN '11 10 11 14 15  '$ CREATININE <0.30* <0.30* <0.30* <0.30* <0.30*  CALCIUM 8.8* 9.0 8.7* 9.0 9.3  MG 1.8 1.8 2.1 2.0 2.1   PHOS 2.2* 2.8 2.9 3.3 3.3    GFR: CrCl cannot be calculated (This lab value cannot be used to calculate CrCl because it is not Tieshia Rettinger number: <0.30).  Liver Function Tests: Recent Labs  Lab 06/26/22 0416 06/27/22 0454 06/28/22 0349 06/29/22 0419 06/30/22 0534  AST '20 26 28 30 '$ 32  ALT 47* 47* 52* 50* 56*  ALKPHOS 98 89 93 86 97  BILITOT 0.3 0.4 0.5 0.4 0.3  PROT 6.7 6.7 6.9 6.9 7.1  ALBUMIN 2.8* 2.7* 2.9* 2.9* 3.0*    CBG: Recent Labs  Lab 06/29/22 0637 06/29/22 1108 06/29/22 1823 06/30/22 0030 06/30/22 0456  GLUCAP 116* 127* 110* 126* 128*     Recent Results (from the past 240 hour(s))  Resp panel by RT-PCR (RSV, Flu Giada Schoppe&B, Covid) Anterior Nasal Swab     Status: None   Collection Time: 06/22/22  5:39 AM   Specimen: Anterior Nasal Swab  Result Value Ref Range Status   SARS Coronavirus 2 by RT PCR NEGATIVE NEGATIVE Final    Comment: (NOTE) SARS-CoV-2 target nucleic acids are NOT DETECTED.  The SARS-CoV-2 RNA is generally detectable in upper respiratory specimens during the acute phase of infection. The lowest concentration of SARS-CoV-2 viral copies this assay can detect is 138 copies/mL. Abel Ra negative result does not preclude SARS-Cov-2 infection and should not be used as the sole basis for treatment or other patient management decisions. Madysen Faircloth negative result may occur with  improper specimen collection/handling, submission of specimen other than nasopharyngeal swab, presence of viral mutation(s) within the areas targeted by this assay, and inadequate number of viral copies(<138 copies/mL). Nuno Brubacher negative result must be combined with clinical observations, patient history, and epidemiological information. The expected result is Negative.  Fact Sheet for Patients:  EntrepreneurPulse.com.au  Fact Sheet for Healthcare Providers:  IncredibleEmployment.be  This test is no t yet approved or cleared by the [place] FDA and  has been  authorized for detection and/or diagnosis of SARS-CoV-2 by FDA under an Emergency Use Authorization (EUA). This EUA will remain  in effect (meaning this test can be used) for the duration of the COVID-19 declaration under Section 564(b)(1) of the Act, 21 U.S.C.section 360bbb-3(b)(1), unless the authorization is terminated  or revoked sooner.       Influenza Raeghan Demeter by PCR NEGATIVE NEGATIVE Final   Influenza B by PCR NEGATIVE NEGATIVE Final    Comment: (NOTE) The Xpert Xpress SARS-CoV-2/FLU/RSV plus assay is intended as an aid in the diagnosis of influenza from Nasopharyngeal swab specimens and should not be used as Calypso Hagarty sole basis for treatment. Nasal washings and aspirates are unacceptable for Xpert Xpress SARS-CoV-2/FLU/RSV testing.  Fact Sheet for Patients: EntrepreneurPulse.com.au  Fact Sheet for Healthcare Providers: IncredibleEmployment.be  This test is not yet approved or cleared by the [place] FDA and has been authorized for detection and/or diagnosis of SARS-CoV-2 by FDA under an Emergency Use Authorization (EUA). This EUA will remain in effect (meaning this test can be used) for the duration of the COVID-19 declaration under Section 564(b)(1) of the Act, 21 U.S.C. section 360bbb-3(b)(1), unless the authorization is terminated or revoked.     Resp Syncytial Virus by PCR NEGATIVE NEGATIVE Final    Comment: (NOTE) Fact Sheet for Patients: EntrepreneurPulse.com.au  Fact Sheet for Healthcare Providers: IncredibleEmployment.be  This test is not yet approved or cleared by the [place] FDA and has been authorized for detection and/or diagnosis of SARS-CoV-2 by FDA under an Emergency Use Authorization (EUA). This EUA will remain in effect (meaning this test can be used) for the duration of the COVID-19 declaration under Section 564(b)(1) of the Act, 21 U.S.C. section 360bbb-3(b)(1), unless the  authorization is terminated or revoked.  Performed at [facility], [place] [address] [place], [place] [address]   Expectorated Sputum Assessment w Gram Stain, Rflx to Resp Cult     Status: None   Collection Time: 06/22/22  6:05 AM   Specimen: Expectorated Sputum  Result Value Ref Range Status   Specimen Description EXPECTORATED SPUTUM  Final   Special Requests NONE  Final   Sputum evaluation   Final    THIS SPECIMEN IS ACCEPTABLE FOR SPUTUM CULTURE Performed at [facility], [place] [address], [place], [place] [address]    Report Status 06/22/2022 FINAL  Final  Culture, Respiratory w Gram Stain     Status: None  Collection Time: 06/22/22  6:05 AM  Result Value Ref Range Status   Specimen Description EXPECTORATED SPUTUM  Final   Special Requests NONE Reflexed from [id number]  Final   Gram Stain   Final    MODERATE WBC PRESENT,BOTH PMN AND MONONUCLEAR RARE GRAM NEGATIVE RODS RARE GRAM NEGATIVE DIPLOCOCCI IN BOTH AEROBIC AND ANAEROBIC BOTTLES GRAM POSITIVE COCCI IN PAIRS    Culture   Final    MODERATE PSEUDOMONAS AERUGINOSA Two isolates with different morphologies were identified as the same organism.The most resistant organism was reported. NO STAPHYLOCOCCUS AUREUS ISOLATED Performed at [facility], [place] [address] [place], [place] [address]    Report Status 06/27/2022 FINAL  Final   Organism ID, Bacteria PSEUDOMONAS AERUGINOSA  Final      Susceptibility   Pseudomonas aeruginosa - MIC*    CEFTAZIDIME 4 SENSITIVE Sensitive     CIPROFLOXACIN <=0.25 SENSITIVE Sensitive     GENTAMICIN <=1 SENSITIVE Sensitive     IMIPENEM 2 SENSITIVE Sensitive     PIP/TAZO >=128 RESISTANT Resistant     CEFEPIME 4 SENSITIVE Sensitive     * MODERATE PSEUDOMONAS AERUGINOSA  Culture, blood (Routine X 2) w Reflex to ID Panel     Status: None   Collection Time: 06/22/22  9:16 PM   Specimen: BLOOD  Result Value Ref Range Status   Specimen Description BLOOD LEFT ANTECUBITAL  Final    Special Requests   Final    BOTTLES DRAWN AEROBIC AND ANAEROBIC Blood Culture adequate volume   Culture   Final    NO GROWTH 5 DAYS Performed at [facility], [place] [address], [place], [place] [address]    Report Status 06/28/2022 FINAL  Final  Culture, blood (Routine X 2) w Reflex to ID Panel     Status: None   Collection Time: 06/22/22 10:08 PM   Specimen: BLOOD  Result Value Ref Range Status   Specimen Description BLOOD LEFT ANTECUBITAL  Final   Special Requests   Final    BOTTLES DRAWN AEROBIC AND ANAEROBIC Blood Culture results may not be optimal due to an excessive volume of blood received in culture bottles   Culture   Final    NO GROWTH 5 DAYS Performed at [facility], [place] [address], [place], [place] [address]    Report Status 06/27/2022 FINAL  Final  Urine Culture     Status: Abnormal   Collection Time: 06/23/22  2:33 AM   Specimen: Urine, Catheterized  Result Value Ref Range Status   Specimen Description URINE, CATHETERIZED  Final   Special Requests   Final    NONE Performed at [facility], [place] [address] [place], [place] [address]    Culture MULTIPLE SPECIES PRESENT, SUGGEST RECOLLECTION (Taelar Gronewold)  Final   Report Status 06/24/2022 FINAL  Final  Respiratory (~20 pathogens) panel by PCR     Status: None   Collection Time: 06/23/22  9:54 PM   Specimen: Nasopharyngeal Swab; Respiratory  Result Value Ref Range Status   Adenovirus NOT DETECTED NOT DETECTED Final   Coronavirus 229E NOT DETECTED NOT DETECTED Final    Comment: (NOTE) The Coronavirus on the Respiratory Panel, DOES NOT test for the novel  Coronavirus (2019 nCoV)    Coronavirus HKU1 NOT DETECTED NOT DETECTED Final   Coronavirus NL63 NOT DETECTED NOT DETECTED Final   Coronavirus OC43 NOT DETECTED NOT DETECTED Final   Metapneumovirus NOT DETECTED NOT DETECTED Final   Rhinovirus / Enterovirus NOT DETECTED  NOT DETECTED Final   Influenza Varina Hulon NOT DETECTED NOT DETECTED Final   Influenza B NOT  DETECTED NOT DETECTED Final   Parainfluenza Virus 1 NOT DETECTED NOT DETECTED Final   Parainfluenza Virus 2 NOT DETECTED NOT DETECTED Final   Parainfluenza Virus 3 NOT DETECTED NOT DETECTED Final   Parainfluenza Virus 4 NOT DETECTED NOT DETECTED Final   Respiratory Syncytial Virus NOT DETECTED NOT DETECTED Final   Bordetella pertussis NOT DETECTED NOT DETECTED Final   Bordetella Parapertussis NOT DETECTED NOT DETECTED Final   Chlamydophila pneumoniae NOT DETECTED NOT DETECTED Final   Mycoplasma pneumoniae NOT DETECTED NOT DETECTED Final    Comment: Performed at [facility], [place] [address], [place], [place] [address]  MRSA Next Gen by PCR, Nasal     Status: None   Collection Time: 06/24/22 12:36 AM   Specimen: Nasal Mucosa; Nasal Swab  Result Value Ref Range Status   MRSA by PCR Next Gen NOT DETECTED NOT DETECTED Final    Comment: (NOTE) The GeneXpert MRSA Assay (FDA approved for NASAL specimens only), is one component of Jessamyn Watterson comprehensive MRSA colonization surveillance program. It is not intended to diagnose MRSA infection nor to guide or monitor treatment for MRSA infections. Test performance is not FDA approved in patients less than 53 years old. Performed at [facility], [place] [address], [place], [place] [address]   Cath Tip Culture     Status: None   Collection Time: 06/24/22  1:21 AM   Specimen: Catheter Tip; Other  Result Value Ref Range Status   Specimen Description CATH TIP  Final   Special Requests NONE  Final   Culture   Final    NO GROWTH 3 DAYS Performed at [facility], [place] [address], [place], [place] [address]    Report Status 06/27/2022 FINAL  Final  Urine Culture     Status: None   Collection Time: 06/26/22  4:52 PM   Specimen: Urine, Clean Catch  Result Value Ref Range Status   Specimen Description URINE, CLEAN CATCH  Final   Special Requests Immunocompromised  Final   Culture   Final    NO GROWTH Performed at [facility],  [place] [address] [place], [place] [address]    Report Status 06/27/2022 FINAL  Final         Radiology Studies: [place] EKG SITE RITE  Result Date: 06/29/2022 If Site Rite image not attached, placement could not be confirmed due to current cardiac rhythm.       Scheduled Meds:  Chlorhexidine Gluconate Cloth  6 each Topical Daily   enoxaparin (LOVENOX) injection  40 mg Subcutaneous Q24H   glycopyrrolate  0.1 mg Intravenous BID   heparin lock flush  500 Units Intracatheter Q30 days   mouth rinse  15 mL Mouth Rinse Q2H   scopolamine  1 patch Transdermal Q72H   sodium chloride flush  10-40 mL Intracatheter Q12H   sodium chloride flush  3 mL Intravenous Q12H   Continuous Infusions:  sodium chloride 75 mL/hr at 06/30/22 1600   ceFEPime (MAXIPIME) IV Stopped (06/30/22 1544)   TPN ADULT (ION) 75 mL/hr at 06/30/22 1842     LOS: 6 days    Time spent: over 30 min    Fayrene Helper, MD [facility]   To contact the attending provider between 7A-7P or the covering provider during after hours 7P-7A, please log into the web site [email or web address] and access using universal [place] password  for that web site. If you do not have the password, please call the hospital operator.  06/30/2022, 8:13 PM

## 2022-07-01 NOTE — Progress Notes (Deleted)
[place] Progress Note Patient Name: Dennys Guin DOB: [birth year] MRN: [id number]   Date of Service  07/01/2022  HPI/Events of Note  Bedside RN questioning whether to place regular Foley catheter vs Coude catheter.  eICU Interventions  Urology nurse to try to insert regular Foley catheter, and if any resistance is experienced, to insert Coude instead. This was discussed with Lucas Mallow (bedside RN).        Kerry Kass Lydie Stammen 07/01/2022, 1:02 AM

## 2022-07-01 NOTE — Progress Notes (Addendum)
PHARMACY - TOTAL PARENTERAL NUTRITION CONSULT NOTE  Indication: chronic ileus in the setting of ALS   Patient Measurements: Height: '5\' 5"'$  (165.1 cm) Weight: 61.4 kg (135 lb 5.8 oz) IBW/kg (Calculated) : 61.5 TPN AdjBW (KG): 57.3 Body mass index is 22.53 kg/m. Usual Weight: 120- 130 lbs  Assessment:  51 yo man on TPN through [facility] prior to admission for chronic ileus related to ALS, now admitted with ileus. Patient also has a PEG tube that is used for medication administration. Pharmacy consulted to resume TPN.  Glucose / Insulin: no hx DM, A1C 5.4% - CBGs < 130, off SSI Electrolytes: Cl 112,  CoCa 10, others WNL Renal: SCr < 0.3, BUN WNL - stable Hepatic: ALT slightly elev (60), AST/ Tbili / TG WNL, albumin 3.1 Intake / Output; MIVF: UOP 2 ml/kg/hr, other 12m, LBM 12/27, net + 7.7L but insensible losses not accounted for  GI Imaging: 12/19 KUB: chronic ileus gas pattern 12/25 KUB: nonspecific bowel gas pattern w/ gaseous distension of small and large bowel. No definite change from prior study GI Surgeries / Procedures: none  Central access: implanted port 07/06/19  TPN start date: PTA  Nutritional Goals:  RD Estimated Needs Total Energy Estimated Needs: 1800-1900 Total Protein Estimated Needs: 95-105 grams Total Fluid Estimated Needs: >1.8 L/day  Home TPN formula (Added to media files): -2000 ml over 24 hours, rate of 83.3 ml/hr -100 g protein, 250 g dextrose, 56 g Smof lipids, 1809 Kcal -NaCl 10 mEq, NaAcet 60 mEq, Kcl 16 mEq, Kphos 30 mmol, Mg 10 mEq, CaGluc 9 mEq MVI, Trace elements   Current Nutrition:  NPO + TPN  Plan:  Cycle TPN over 18 hrs (Rate 54-109 mL/hr, GIR 2-4 mg/kg/min), provides 102g AA and 1843 kCal, meeting 100% of estimated needs Electrolytes in TPN: Na 100 mEq/L,  K 65 mEq/L,Ca 4 mEq/L, Mg 15 mEq/L (max in TPN), Phos 20 mmol/L, Cl:Ac 1:2 Add standard MVI and trace elements to TPN   Monitor TPN labs Mon/Thurs and as needed Continue to cycle as able    LBenetta Spar PharmD, BCPS, BCCP Clinical Pharmacist  Please check AMION for all [place] numbers After 10:00 PM, call [place]

## 2022-07-01 NOTE — Progress Notes (Signed)
Brief Nutrition Note:   Noted possible discharge home tomorrow; pt on TPN prior to admission for chronic ileus and plan to discharge on TPN  Per abdominal films, ileus persists  Currently receiving cyclic TPN over 18 hours providing 102 g of protein and 1843 kcals.  EnFit G tube remains in place.  Labs reviewed  Recommend resuming TPN regimen at discharge. Noted TOC team working on ensuring all supplies and TPN are ready at d/c.   Kerman Passey MS, RDN, LDN, CNSC Registered Dietitian 3 Clinical Nutrition RD Pager and On-Call Pager Number Located in [place]

## 2022-07-01 NOTE — Procedures (Signed)
Tracheostomy Change Note  Patient Details:   Name: Jerry [name] DOB: 1970/10/22 MRN: [id number]    Airway Documentation:     Evaluation  O2 sats: stable throughout Complications: No apparent complications Patient did tolerate procedure well. Bilateral Breath Sounds: Clear, Diminished  Tracheostomy changed per order by RT X 2. #8 Shiley cuffed removed and new #8 Shiley cuffed placed with no complications. Positive color change noted on ETCO2. BBS clear and diminished. Vitals are stable. RT will continue to monitor.     Nicholaus Steinke Clyda Greener 07/01/2022, 5:07 PM

## 2022-07-01 NOTE — TOC Progression Note (Signed)
Transition of Care [facility]) - Progression Note    Patient Details  Name: Jerry [name] MRN: [id number] Date of Birth: Feb 10, 1971  [facility] CM/SW Contact  Erenest Rasher, RN Phone Number: [phone] 07/01/2022, 2:54 PM  Clinical Narrative:    CM spoke to pt's wife and states she will travel with patient in ambulance home. She will bring his vent from home. [place] orders in with TPN orders. Notified [facility] rep, Pam for resumption of care. Alvis Lemmings Adult nursing team will meet pt when he gets home. Wife states she had questions about vent and wanted CM to follow up with [employer] rep, Marya Amsler. Contacted Marya Amsler and states he will give pt's wife a call to discuss home vent.    Expected Discharge Plan: [place] Barriers to Discharge: Continued Medical Work up  Expected Discharge Plan and Services   Discharge Planning Services: CM Consult Post Acute Care Choice: [place] arrangements for the past 2 months: [place]: RN [place] Agency: [facility], [place] Date [place]: 06/25/22 Time [place]: [place] Representative spoke with at [place]: [place] Determinants of Health ([place]) Interventions SDOH Screenings   Food Insecurity: No Food Insecurity (01/21/2022)  Housing: Low Risk  (09/04/2020)  Transportation Needs: No Transportation Needs (01/21/2022)  Alcohol Screen: Low Risk  (09/04/2020)  Depression (PHQ2-9): Low Risk  (11/13/2021)  Financial Resource Strain: Low Risk  (09/04/2020)  Physical Activity: Inactive (09/04/2020)  Social Connections: Moderately Isolated (09/04/2020)  Stress: No Stress Concern Present (09/04/2020)  Tobacco Use: Low Risk  (06/22/2022)    Readmission Risk Interventions     No data to display

## 2022-07-01 NOTE — Progress Notes (Signed)
eLink Physician-Brief Progress Note Patient Name: Jerry [name] DOB: 1971/01/12 MRN: [id number]   Date of Service  07/01/2022  HPI/Events of Note  Patient with slow cuff leak but no issues with ventilation, RT added air to the balloon, trach was recently exchanged.  eICU Interventions  Monitor closely for recurrence of leak,  possible tracheostomy exchange in AM if problem persists.        Kerry Kass Daniel Ritthaler 07/01/2022, 12:55 AM

## 2022-07-02 ENCOUNTER — Telehealth: Payer: Self-pay

## 2022-07-02 DIAGNOSIS — J9621 Acute and chronic respiratory failure with hypoxia: Secondary | ICD-10-CM | POA: Diagnosis not present

## 2022-07-02 DIAGNOSIS — G1221 Amyotrophic lateral sclerosis: Secondary | ICD-10-CM | POA: Diagnosis not present

## 2022-07-02 DIAGNOSIS — J151 Pneumonia due to Pseudomonas: Secondary | ICD-10-CM | POA: Diagnosis not present

## 2022-07-02 DIAGNOSIS — R509 Fever, unspecified: Secondary | ICD-10-CM | POA: Diagnosis not present

## 2022-07-02 DIAGNOSIS — Z930 Tracheostomy status: Secondary | ICD-10-CM

## 2022-07-02 DIAGNOSIS — R140 Abdominal distension (gaseous): Secondary | ICD-10-CM | POA: Diagnosis not present

## 2022-07-02 DIAGNOSIS — K567 Ileus, unspecified: Secondary | ICD-10-CM | POA: Diagnosis not present

## 2022-07-02 LAB — CBC WITH DIFFERENTIAL/PLATELET
Abs Immature Granulocytes: 0.07 10*3/uL (ref 0.00–0.07)
Basophils Absolute: 0 10*3/uL (ref 0.0–0.1)
Basophils Relative: 0 %
Eosinophils Absolute: 0.3 10*3/uL (ref 0.0–0.5)
Eosinophils Relative: 4 %
HCT: 29.1 % — ABNORMAL LOW (ref 39.0–52.0)
Hemoglobin: 9.1 g/dL — ABNORMAL LOW (ref 13.0–17.0)
Immature Granulocytes: 1 %
Lymphocytes Relative: 20 %
Lymphs Abs: 1.5 10*3/uL (ref 0.7–4.0)
MCH: 26.9 pg (ref 26.0–34.0)
MCHC: 31.3 g/dL (ref 30.0–36.0)
MCV: 86.1 fL (ref 80.0–100.0)
Monocytes Absolute: 0.7 10*3/uL (ref 0.1–1.0)
Monocytes Relative: 9 %
Neutro Abs: 5 10*3/uL (ref 1.7–7.7)
Neutrophils Relative %: 66 %
Platelets: 160 10*3/uL (ref 150–400)
RBC: 3.38 MIL/uL — ABNORMAL LOW (ref 4.22–5.81)
RDW: 19.2 % — ABNORMAL HIGH (ref 11.5–15.5)
WBC: 7.6 10*3/uL (ref 4.0–10.5)
nRBC: 0 % (ref 0.0–0.2)

## 2022-07-02 LAB — COMPREHENSIVE METABOLIC PANEL
ALT: 53 U/L — ABNORMAL HIGH (ref 0–44)
AST: 30 U/L (ref 15–41)
Albumin: 3.1 g/dL — ABNORMAL LOW (ref 3.5–5.0)
Alkaline Phosphatase: 94 U/L (ref 38–126)
Anion gap: 8 (ref 5–15)
BUN: 19 mg/dL (ref 6–20)
CO2: 26 mmol/L (ref 22–32)
Calcium: 9.6 mg/dL (ref 8.9–10.3)
Chloride: 111 mmol/L (ref 98–111)
Creatinine, Ser: <0.3 mg/dL — ABNORMAL LOW (ref 0.61–1.24)
Glucose, Bld: 147 mg/dL — ABNORMAL HIGH (ref 70–99)
Potassium: 3.9 mmol/L (ref 3.5–5.1)
Sodium: 145 mmol/L (ref 135–145)
Total Bilirubin: 0.5 mg/dL (ref 0.3–1.2)
Total Protein: 7.4 g/dL (ref 6.5–8.1)

## 2022-07-02 LAB — MAGNESIUM: Magnesium: 2.2 mg/dL (ref 1.7–2.4)

## 2022-07-02 LAB — PHOSPHORUS: Phosphorus: 3.4 mg/dL (ref 2.5–4.6)

## 2022-07-02 MED ORDER — IPRATROPIUM-ALBUTEROL 0.5-2.5 (3) MG/3ML IN SOLN: 3.0000 mL | Freq: Four times a day (QID) | RESPIRATORY_TRACT | 0 refills | Status: DC | PRN

## 2022-07-02 MED ORDER — SCOPOLAMINE 1 MG/3DAYS TD PT72: 1.0000 | MEDICATED_PATCH | TRANSDERMAL | 0 refills | Status: DC

## 2022-07-02 MED ORDER — POLYETHYLENE GLYCOL 3350 17 GM/SCOOP PO POWD: 17.0000 g | Freq: Every day | ORAL | 0 refills | Status: DC

## 2022-07-02 MED ORDER — GLYCOPYRROLATE 1 MG PO TABS: 1.0000 mg | ORAL_TABLET | Freq: Two times a day (BID) | ORAL | 0 refills | Status: DC

## 2022-07-02 NOTE — Discharge Summary (Signed)
Physician Discharge Summary  Jerry [name] LPF:[id number] DOB: Jul 19, 1970 DOA: 06/22/2022  PCP: [name], MD  Admit date: 06/22/2022 Discharge date: 07/02/2022  Time spent: 40 minutes  Recommendations for Outpatient Follow-up:  Follow outpatient CBC/CMP  Follow up ALS care with neurology/primary provider   Discharge Diagnoses:  Principal Problem:   Fever Active Problems:   ALS (amyotrophic lateral sclerosis) ([place])   Autonomic dysfunction   Ventilator dependent ([place])   Tracheostomy dependent (HCC)   Ileus (HCC)   Chronic respiratory failure with hypoxia ([place])   Tracheostomy dependence ([place])   Chronic respiratory failure with hypoxia and hypercapnia ([place])   Discharge Condition: stable  Diet recommendation: NPO, on TPN  Filed Weights   06/30/22 1935 07/01/22 0500 07/02/22 0417  Weight: 61.1 kg 61.4 kg 61.1 kg    History of present illness:  51 y.o. BM PMHx  ALS with chronic tracheostomy/ventilator-dependent hypoxic respiratory failure, autonomic dysfunction, and ileus on TPN    Presented to the emergency department with fever.   Patient was discharged home from [facility] 1 week prior to admission and is being cared for primarily by his wife with home health RN.  His wife was having difficulty detaching TPN tubing from his PICC and called home health RN for assistance.  Home health RN reports [name] fever of 100.5 F and abdominal distention.  He was sent to the ED for evaluation of this.     He was found to have pseudomonas pneumonia.  Treated with 7 days of cefepime.  Blood cultures were negative.  Stable at time of discharge on 12/29.  Home health services arranged for TPN and home vent.    See below for additional details  Hospital Course:  Assessment and Plan: Pseudomonas pneumonia  Fever 12/25 sputum was positive for drug-resistant Pseudomonas.  Patient did not exhibit other signs and symptoms i.e. negative leukocytosis, however with treatment signs and symptoms have  cleared. Cefepime 12/22 - 12/28. Blood cx 12/19 NGTDx5 Negative RVP Negative cath tip culture Urine cx with multiple species, repeat no growth   Chronic hypoxic respiratory failure  Stable on home vent settings, appreciate PCCM involvement  Vent settings - PRVC, rate 16, Vt 500, PEEP 5, FiO2 30% 12/22 had trach change, #8 cuffed  12/28 had trach change, #8 shiley cuffed placed   ALS  Continue trach and PEG care, consult palliative care    Chronic ileus  Hasn't tolerated tube feeds via peg since August Receives only medicines via PEG Continue TPN-12/19 IR replaced PEG   Autonomic dysfunction  Vitals currently stable   Hypoglycemia Treat for hypoglycemia A1c 12/2021 was 5.4   Hypokalemia improved   Hypophosphatemia improved   Anemia Appears c/w AOCD Trend, relatively stable   Protein calorie malnutrition PICC replaced 12/27 for TPN Per discussion with his wife, hasn't been able to tolerate tube feeds since August due to the chronic ileus Continue TPN, follow outpatient   Stage 1 Pressure Injury Frequent turns    Procedures: 12/19 IMPRESSION: Successful bedside replacement of Jaeda Bruso new 24 Fr Enfit hub-type gastrostomy tube.   The new gastrostomy tube is ready for immediate use.  Consultations: PCCM IR  Discharge Exam: Vitals:   07/02/22 0823 07/02/22 0824  BP:    Pulse: 77   Resp: 16   Temp:    SpO2: 98% 97%   No new complaints Called wife, no answer  General: No acute distress. Cardiovascular: Heart sounds show [name] regular rate, and rhythm.  Lungs: unlabored on vent, CTAB Abdomen: mild distension,  peg Neurological: Alert and oriented 3. Communicates with eyes/blinking and tablet. Extremities: No clubbing or cyanosis. No edema.  Discharge Instructions   Discharge Instructions     Call MD for:  difficulty breathing, headache or visual disturbances   Complete by: As directed    Call MD for:  extreme fatigue   Complete by: As directed     Call MD for:  hives   Complete by: As directed    Call MD for:  persistant dizziness or light-headedness   Complete by: As directed    Call MD for:  persistant nausea and vomiting   Complete by: As directed    Call MD for:  redness, tenderness, or signs of infection (pain, swelling, redness, odor or green/yellow discharge around incision site)   Complete by: As directed    Call MD for:  severe uncontrolled pain   Complete by: As directed    Call MD for:  temperature >100.4   Complete by: As directed    Diet - low sodium heart healthy   Complete by: As directed    Discharge instructions   Complete by: As directed    You were seen for [name] fever and treated for pneumonia for 7 days with antibiotics.  Your infectious symptoms have improved.    We'll send you home with plans to continue TPN (nutrition through the IV).  You'll also continue the ventilator at home.  We'll arrange home health nursing for you.  Return for new, recurrent, or worsening symptoms.  Please ask your PCP to request records from this hospitalization so they know what was done and what the next steps will be.   Increase activity slowly   Complete by: As directed       Allergies as of 07/02/2022       Reactions   Crab [shellfish Allergy] Rash        Medication List     STOP taking these medications    Baclofen 5 MG Tabs   bisacodyl 5 MG EC tablet Generic drug: bisacodyl   enoxaparin 40 MG/0.4ML injection Commonly known as: LOVENOX   famotidine 20 MG tablet Commonly known as: PEPCID   feeding supplement ([place] STANDARD 1.4) Liqd liquid   hydrOXYzine 25 MG tablet Commonly known as: ATARAX   hydrOXYzine 50 MG tablet Commonly known as: ATARAX   magnesium oxide 400 (240 Mg) MG tablet Commonly known as: MAG-OX   melatonin 3 MG Tabs tablet   melatonin 5 MG Tabs   metoCLOPramide 10 MG tablet Commonly known as: REGLAN   midodrine 10 MG tablet Commonly known as: PROAMATINE    nutrition supplement (JUVEN) Pack   pantoprazole sodium 40 mg Commonly known as: PROTONIX   potassium chloride SA 20 MEQ tablet Commonly known as: KLOR-CON M   senna 8.6 MG Tabs tablet Commonly known as: SENOKOT   simethicone 80 MG chewable tablet Commonly known as: MYLICON   tobramycin (PF) 300 MG/5ML nebulizer solution Commonly known as: TOBI       TAKE these medications    acetaminophen 325 MG tablet Commonly known as: TYLENOL Place 650 mg into feeding tube every 6 (six) hours as needed for mild pain.   glycopyrrolate 1 MG tablet Commonly known as: ROBINUL Place 1 mg into feeding tube 2 (two) times daily.   ipratropium-albuterol 0.5-2.5 (3) MG/3ML Soln Commonly known as: DUONEB Take 3 mLs by nebulization every 6 (six) hours as needed (shortness of breath).   polyethylene glycol powder 17 GM/SCOOP powder Commonly  known as: GLYCOLAX/MIRALAX Place 17 g into feeding tube daily.   PRESCRIPTION MEDICATION Home TPN   scopolamine 1 MG/3DAYS Commonly known as: TRANSDERM-SCOP Place 1 patch (1.5 mg total) onto the skin every 3 (three) days.       Allergies  Allergen Reactions   Crab [Shellfish Allergy] Rash    Follow-up Information     [name], MD. Call .   Specialty: Family Medicine Why: to schedule an appointment for close follow up Contact information: [address] [address] [place], [place] Follow up.   Specialty: [place] Why: [facility] for TPN, Ysidro Evert will supply the TPN Contact information: [place] [address] [phone]                  The results of significant diagnostics from this hospitalization (including imaging, microbiology, ancillary and laboratory) are listed below for reference.    Significant Diagnostic Studies: [place] EKG SITE RITE  Result Date: 06/29/2022 If Site Rite image not attached,  placement could not be confirmed due to current cardiac rhythm.  DG Abd 1 View  Result Date: 06/28/2022 CLINICAL DATA:  Ileus EXAM: ABDOMEN - 1 VIEW COMPARISON:  06/22/2022. FINDINGS: Limited examination including only the upper abdomen demonstrates nonspecific gaseous distention of small and large bowel. Gastrostomy tube overlies the left upper quadrant. IMPRESSION: Nonspecific bowel gas pattern with gaseous distention of small and large bowel. No definite change compared to the prior study. Electronically Signed   By: Sammie Bench M.D.   On: 06/28/2022 10:37   DG CHEST PORT 1 VIEW  Result Date: 06/23/2022 CLINICAL DATA:  Shortness of breath EXAM: PORTABLE CHEST 1 VIEW COMPARISON:  06/23/2022, CT 12/09/2021, radiograph 05/07/2022, 04/09/2022, 12/09/2021 FINDINGS: Tracheostomy tube remains in place. Right-sided central venous port tip at the proximal right atrium. No significant change in heterogeneousairspace opacity at left base. Stable cardiomediastinal silhouette. Air distended bowel in the upper abdomen IMPRESSION: No significant change in heterogeneous airspace disease at the left base. Electronically Signed   By: Donavan Foil M.D.   On: 06/23/2022 21:00   IR REPLACE G-TUBE SIMPLE WO FLUORO  Result Date: 06/22/2022 INDICATION: Patient with G-tube, exchange requested by ED, concern for bacterial growth in tubing. Tube placed at OSH.  No record within system. EXAM: BEDSIDE REPLACEMENT OF GASTROSTOMY TUBE COMPARISON:  KUB, concurrent.  CT AP, 03/28/2022. COMPLICATIONS: None immediate. PROCEDURE: The upper abdomen and external portion of the existing gastrostomy tube was examined. The tube was intact and proceded with replacement. Viscous lidocaine was instilled in the tract about the existing gastrostomy tube. The indwelling pull-through tube was removed without incident. Tyrese Capriotti new 24 Fr balloon retention g-tube was easily placed into the tract. The balloon was inflated with 10 mL NS and bumper  disc was cinched. Tube gave Shanelle Clontz whoosh of air upon placement and gastric contents were seen in the tube. Katisha Shimizu dressing was placed. The patient tolerated the procedure well without immediate postprocedural complication. Feige Lowdermilk KUB with contrast was ordered and demonstrates tube in proper position. IMPRESSION: Successful bedside replacement of Audri Kozub new 24 Fr Enfit hub-type gastrostomy tube. The new gastrostomy tube is ready for immediate use. Read by: Alexandria Lodge, PA-C Electronically Signed   By: Michaelle Birks M.D.   On: 06/22/2022 16:35   DG ABDOMEN PEG TUBE LOCATION  Result Date: 06/22/2022 CLINICAL DATA:  Malfunctioning gastrostomy tube. Tube  placed at OSH, no record within system. EXAM: ABDOMEN - 1 VIEW COMPARISON:  KUB, 06/22/2022.  CT AP, 03/28/2022. FINDINGS: Support lines: Well-positioned gastrostomy tube with air and intraluminal contrast opacifying the stomach. Similar gaseous distention of small bowel and colon. No interval osseous abnormality. IMPRESSION: Well-positioned gastrostomy tube, with intraluminal contrast opacification of stomach. Electronically Signed   By: Michaelle Birks M.D.   On: 06/22/2022 16:33   DG Abdomen 1 View  Result Date: 06/22/2022 CLINICAL DATA:  51 year old male with possible sepsis. Fever. Ileus. EXAM: ABDOMEN - 1 VIEW COMPARISON:  Abdominal radiographs 06/14/2022 and earlier. FINDINGS: Portable AP supine view at 0510 hours. Gastrostomy tube remains in place. Diffuse large and small bowel gas throughout the abdomen and pelvis again noted, not significantly changed over this series of exams, including at [facility] similar appearance of the abdomen in June this year (12/12/2021. Stable visualized osseous structures. IMPRESSION: Chronic ileus gas pattern and gastrostomy tube not significantly changed. Electronically Signed   By: Genevie Ann M.D.   On: 06/22/2022 05:26   DG Chest Port 1 View  Result Date: 06/22/2022 CLINICAL DATA:  51 year old male with possible sepsis.  Fever. EXAM:  PORTABLE CHEST 1 VIEW COMPARISON:  Portable chest 05/07/2022 and earlier. FINDINGS: Portable AP upright view at 0507 hours. Stable right chest dual lumen power port, PICC line, and tracheostomy. Elevated left hemidiaphragm and patchy, confluent left perihilar and lung base opacity has not significantly changed from last month. Contralateral right lung remains clear when allowing for portable technique. No background pulmonary edema. No pneumothorax. No cardiomegaly. Stable visible bowel gas pattern, gastrostomy tube. IMPRESSION: 1. Stable lines and tubes. 2. Chronic elevated left hemidiaphragm, left perihilar and lung base opacity which is not significantly changed from October and doubtful for acute infection. 3. No new cardiopulmonary abnormality. Electronically Signed   By: Genevie Ann M.D.   On: 06/22/2022 05:19   DG Abd Portable 1V  Result Date: 06/14/2022 CLINICAL DATA:  Ileus EXAM: PORTABLE ABDOMEN - 1 VIEW COMPARISON:  06/10/2022 abdominal radiograph FINDINGS: Percutaneous gastrostomy tube button overlies the body of the stomach. Generalized mild to moderate small bowel dilatation, mildly worsened. Diffuse moderate gaseous dilatation of the large bowel, mildly worsened. No evidence of pneumatosis or pneumoperitoneum. No radiopaque nephrolithiasis. Mild lumbar spondylosis. IMPRESSION: Generalized mild-to-moderate small bowel dilatation and diffuse moderate gaseous dilatation of the large bowel, all mildly worsened, compatible with worsened adynamic ileus. Electronically Signed   By: Ilona Sorrel M.D.   On: 06/14/2022 07:53   DG Abd 1 View  Result Date: 06/10/2022 CLINICAL DATA:  51 year old male with chronic ileus. EXAM: ABDOMEN - 1 VIEW COMPARISON:  06/08/2022 abdominal radiographs, CT Abdomen and Pelvis 03/28/2022. FINDINGS: Portable AP supine view at 0613 hours. Left upper quadrant gastrostomy tube and diffuse gas containing small and large bowel loops throughout the abdomen and pelvis appear  unchanged from the CT scout view 03/28/2022. Chronic elevation of the left hemidiaphragm. Right lung base appears negative. Stable visualized osseous structures. IMPRESSION: Stable gastrostomy tube and diffuse ileus bowel gas pattern since September. Electronically Signed   By: Genevie Ann M.D.   On: 06/10/2022 07:24   DG Abd 1 View  Result Date: 06/08/2022 CLINICAL DATA:  Ileus. EXAM: ABDOMEN - 1 VIEW COMPARISON:  June 05, 2022. FINDINGS: Gastrostomy tube is noted in left upper quadrant. No significant small bowel dilatation is noted. Mild gaseous distention of the colon is noted suggesting possible ileus. IMPRESSION: Mild gaseous distention of the colon suggesting possible ileus. Electronically Signed  By: Marijo Conception M.D.   On: 06/08/2022 14:03   DG Abd Portable 1V  Result Date: 06/05/2022 CLINICAL DATA:  Ileus EXAM: PORTABLE ABDOMEN - 1 VIEW COMPARISON:  Prior abdominal radiograph 06/03/2022 FINDINGS: Gastrostomy tube remains in unchanged position. Slight interval decrease in diffuse gaseous distension of small and large bowel throughout the abdomen. No large free air. IMPRESSION: Slight interval improvement in diffuse gaseous distension of large and small bowel throughout the abdomen compared to 06/03/2022. Electronically Signed   By: Jacqulynn Cadet M.D.   On: 06/05/2022 08:13   DG Abd Portable 1V  Result Date: 06/03/2022 CLINICAL DATA:  51 year old male with history of ileus. EXAM: PORTABLE ABDOMEN - 1 VIEW COMPARISON:  05/31/2022. FINDINGS: Gas is noted throughout multiple loops of small bowel and colon, including the distal rectum. Small bowel loops appear prominent but non dilated. No frank pneumoperitoneum. Percutaneous gastrostomy tube with retention balloon projecting over the epigastric region. IMPRESSION: 1. Bowel-gas pattern remains most suggestive of an ileus, as above. Electronically Signed   By: Vinnie Langton M.D.   On: 06/03/2022 06:11    Microbiology: Recent Results  (from the past 240 hour(s))  Culture, blood (Routine X 2) w Reflex to ID Panel     Status: None   Collection Time: 06/22/22  9:16 PM   Specimen: BLOOD  Result Value Ref Range Status   Specimen Description BLOOD LEFT ANTECUBITAL  Final   Special Requests   Final    BOTTLES DRAWN AEROBIC AND ANAEROBIC Blood Culture adequate volume   Culture   Final    NO GROWTH 5 DAYS Performed at [facility], [address] [place], [place] [address]    Report Status 06/28/2022 FINAL  Final  Culture, blood (Routine X 2) w Reflex to ID Panel     Status: None   Collection Time: 06/22/22 10:08 PM   Specimen: BLOOD  Result Value Ref Range Status   Specimen Description BLOOD LEFT ANTECUBITAL  Final   Special Requests   Final    BOTTLES DRAWN AEROBIC AND ANAEROBIC Blood Culture results may not be optimal due to an excessive volume of blood received in culture bottles   Culture   Final    NO GROWTH 5 DAYS Performed at [facility], [place] [address] [place], [place] [address]    Report Status 06/27/2022 FINAL  Final  Urine Culture     Status: Abnormal   Collection Time: 06/23/22  2:33 AM   Specimen: Urine, Catheterized  Result Value Ref Range Status   Specimen Description URINE, CATHETERIZED  Final   Special Requests   Final    NONE Performed at [facility], [place] [address] [place], [place] [address]    Culture MULTIPLE SPECIES PRESENT, SUGGEST RECOLLECTION ([place])  Final   Report Status 06/24/2022 FINAL  Final  Respiratory (~20 pathogens) panel by PCR     Status: None   Collection Time: 06/23/22  9:54 PM   Specimen: Nasopharyngeal Swab; Respiratory  Result Value Ref Range Status   Adenovirus NOT DETECTED NOT DETECTED Final   Coronavirus 229E NOT DETECTED NOT DETECTED Final    Comment: (NOTE) The Coronavirus on the Respiratory Panel, DOES NOT test for the novel  Coronavirus (2019 nCoV)    Coronavirus HKU1 NOT DETECTED NOT DETECTED Final   Coronavirus NL63 NOT DETECTED NOT  DETECTED Final   Coronavirus OC43 NOT DETECTED NOT DETECTED Final   Metapneumovirus NOT DETECTED NOT DETECTED Final   Rhinovirus /  Enterovirus NOT DETECTED NOT DETECTED Final   Influenza Armondo Cech NOT DETECTED NOT DETECTED Final   Influenza B NOT DETECTED NOT DETECTED Final   Parainfluenza Virus 1 NOT DETECTED NOT DETECTED Final   Parainfluenza Virus 2 NOT DETECTED NOT DETECTED Final   Parainfluenza Virus 3 NOT DETECTED NOT DETECTED Final   Parainfluenza Virus 4 NOT DETECTED NOT DETECTED Final   Respiratory Syncytial Virus NOT DETECTED NOT DETECTED Final   Bordetella pertussis NOT DETECTED NOT DETECTED Final   Bordetella Parapertussis NOT DETECTED NOT DETECTED Final   Chlamydophila pneumoniae NOT DETECTED NOT DETECTED Final   Mycoplasma pneumoniae NOT DETECTED NOT DETECTED Final    Comment: Performed at [facility], [place] [address] [place], [place] [address]  MRSA Next Gen by PCR, Nasal     Status: None   Collection Time: 06/24/22 12:36 AM   Specimen: Nasal Mucosa; Nasal Swab  Result Value Ref Range Status   MRSA by PCR Next Gen NOT DETECTED NOT DETECTED Final    Comment: (NOTE) The GeneXpert MRSA Assay (FDA approved for NASAL specimens only), is one component of [facility] [place] comprehensive MRSA colonization surveillance program. It is not intended to diagnose MRSA infection nor to guide or monitor treatment for MRSA infections. Test performance is not FDA approved in patients less than 52 years old. Performed at [facility], [place] [address], [place], [place] [address]   Cath Tip Culture     Status: None   Collection Time: 06/24/22  1:21 AM   Specimen: Catheter Tip; Other  Result Value Ref Range Status   Specimen Description CATH TIP  Final   Special Requests NONE  Final   Culture   Final    NO GROWTH 3 DAYS Performed at [facility], [place] [address] [place], [place] [address]    Report Status 06/27/2022 FINAL  Final  Urine Culture     Status: None   Collection Time: 06/26/22   4:52 PM   Specimen: Urine, Clean Catch  Result Value Ref Range Status   Specimen Description URINE, CLEAN CATCH  Final   Special Requests Immunocompromised  Final   Culture   Final    NO GROWTH Performed at [facility], [place] [address], [place], [place] [address]    Report Status 06/27/2022 FINAL  Final     Labs: Basic Metabolic Panel: Recent Labs  Lab 06/28/22 0349 06/29/22 0419 06/30/22 0534 07/01/22 0442 07/02/22 0555  NA 138 140 139 143 145  K 4.1 4.1 4.0 3.7 3.9  CL 107 107 104 112* 111  CO2 '25 27 25 25 26  '$ GLUCOSE 134* 126* 129* 119* 147*  BUN '11 14 15 15 19  '$ CREATININE <0.30* <0.30* <0.30* <0.30* <0.30*  CALCIUM 8.7* 9.0 9.3 9.3 9.6  MG 2.1 2.0 2.1 2.2 2.2  PHOS 2.9 3.3 3.3 3.1 3.4   Liver Function Tests: Recent Labs  Lab 06/28/22 0349 06/29/22 0419 06/30/22 0534 07/01/22 0442 07/02/22 0555  AST 28 30 32 34 30  ALT 52* 50* 56* 60* 53*  ALKPHOS 93 86 97 96 94  BILITOT 0.5 0.4 0.3 0.6 0.5  PROT 6.9 6.9 7.1 7.8 7.4  ALBUMIN 2.9* 2.9* 3.0* 3.1* 3.1*   No results for input(s): "LIPASE", "AMYLASE" in the last 168 hours. No results for input(s): "AMMONIA" in the last 168 hours. CBC: Recent Labs  Lab 06/28/22 0349 06/29/22 0419 06/30/22 0534 07/01/22 0442 07/02/22 0555  WBC 5.8 7.3 6.8 7.6 7.6  NEUTROABS  4.0 5.3 4.3 5.1 5.0  HGB 8.6* 8.5* 9.1* 9.4* 9.1*  HCT 27.6* 27.3* 28.0* 29.4* 29.1*  MCV 86.5 85.8 83.3 84.7 86.1  PLT 157 146* 177 172 160   Cardiac Enzymes: No results for input(s): "CKTOTAL", "CKMB", "CKMBINDEX", "TROPONINI" in the last 168 hours. BNP: BNP (last 3 results) No results for input(s): "BNP" in the last 8760 hours.  ProBNP (last 3 results) No results for input(s): "PROBNP" in the last 8760 hours.  CBG: Recent Labs  Lab 06/29/22 1823 06/30/22 0030 06/30/22 0456 07/01/22 0736 07/01/22 1109  GLUCAP 110* 126* 128* 115* 131*       Signed:  Fayrene Helper MD.  [facility] 07/02/2022, 8:56 AM

## 2022-07-02 NOTE — Telephone Encounter (Signed)
  Care Management   Visit Note  07/02/2022 Name: Jerry [name] MRN: [id number] DOB: 01-03-1971  Subjective: Jerry [name] is a 51 y.o. year old male who is a primary care patient of [name], MD. The Care Management team was consulted for assistance.      Engaged with patient's spouse/caregiver by telephone.

## 2022-07-02 NOTE — TOC Initial Note (Addendum)
Transition of Care [facility]) - Initial/Assessment Note    Patient Details  Name: Jerry [name] MRN: [id number] Date of Birth: 16-Mar-1971  Transition of Care [facility]) CM/SW Contact:    Erenest Rasher, RN Phone Number: [phone] 07/02/2022, 11:37 AM  Clinical Narrative:               TOC CM spoke to wife and she will bring home vent. Contacted [employer] RT, Marya Amsler and he will meet with wife when pt arrives home. Contacted [employer] Lemmings rep, Rob and they arrange for his private duty RN today. Contacted [employer] rep, Calvin and [facility] RN will come today for assistance with TPN. Contacted [employer] rep, Pam and they will have TPN delivered to home. Contacted [employer] rep, Erasmo Downer, will ship nebulizer machine to home. PTAR arranged for 11 am.    Expected Discharge Plan: [place] Barriers to Discharge: No Barriers Identified   Patient Goals and CMS Choice Patient states their goals for this hospitalization and ongoing recovery are:: wants to return home CMS Medicare.gov Compare Post Acute Care list provided to:: Patient Represenative (must comment) Choice offered to / list presented to : Spouse      Expected Discharge Plan and Services   Discharge Planning Services: CM Consult Post Acute Care Choice: [place] arrangements for the past 2 months: [place] Expected Discharge Date: 07/02/22               DME Arranged: Nebulizer machine DME Agency: [employer] Date DME Agency Contacted: 07/02/22 Time DME Agency Contacted: [id number] Representative spoke with at DME Agency: [place]: RN, Nurse's Aide [place] Agency: [facility] [place], [place] Date [place]: 07/02/22 Time [place]: [id number] Representative spoke with at [place]: [place] with Kennyth Arnold with [facility]  Prior Living Arrangements/Services Living arrangements for the past 2 months: Single Family Home Lives with:: Spouse Patient language and need for interpreter  reviewed:: Yes Do you feel safe going back to the place where you live?: Yes      Need for Family Participation in Patient Care: No (Comment) Care giver support system in place?: Yes (comment) Current home services: DME (hospital bed, suction machine, oxygen, vent) Criminal Activity/Legal Involvement Pertinent to Current Situation/Hospitalization: No - Comment as needed  Activities of Daily Living   ADL Screening (condition at time of admission) Patient's cognitive ability adequate to safely complete daily activities?: Yes Is the patient deaf or have difficulty hearing?: No Does the patient have difficulty seeing, even when wearing glasses/contacts?: No Does the patient have difficulty concentrating, remembering, or making decisions?: No Patient able to express need for assistance with ADLs?: Yes Does the patient have difficulty dressing or bathing?: Yes Independently performs ADLs?: No Communication: Independent with device (comment) Dressing (OT): Dependent Is this a change from baseline?: Pre-admission baseline Grooming: Dependent Is this a change from baseline?: Pre-admission baseline Feeding: Dependent Is this a change from baseline?: Pre-admission baseline Bathing: Dependent Is this a change from baseline?: Pre-admission baseline Toileting: Dependent Is this a change from baseline?: Pre-admission baseline In/Out Bed: Dependent Is this a change from baseline?: Pre-admission baseline Walks in Home: Dependent Is this a change from baseline?: Pre-admission baseline Does the patient have difficulty walking or climbing stairs?: Yes Weakness of Legs: Both Weakness of Arms/Hands: Both  Permission Sought/Granted Permission sought to share information with : Case Manager, Family Supports, PCP Permission granted to share information with : Yes, Verbal Permission Granted  Share Information  with NAME: Mckenna Boruff  Permission granted to share info w AGENCY: [place]  granted to share info w Relationship: wife  Permission granted to share info w Contact Information: [phone]  Emotional Assessment Appearance:: Appears stated age   Affect (typically observed): Accepting Orientation: : Oriented to Self, Oriented to Place, Oriented to  Time, Oriented to Situation   Psych Involvement: No (comment)  Admission diagnosis:  Ileus ([place]) [K56.7] Fever [R50.9] PEG (percutaneous endoscopic gastrostomy) adjustment/replacement/removal ([place]) [Z43.1] Occlusion of peripherally inserted central catheter (PICC) line, initial encounter ([place]) [T82.898A] Patient Active Problem List   Diagnosis Date Noted   Tracheostomy dependence ([place]) 06/25/2022   Chronic respiratory failure with hypoxia and hypercapnia ([place]) 06/25/2022   Fever 06/22/2022   Chronic respiratory failure with hypoxia ([place]) 06/22/2022   Ileus ([place]) 05/19/2022   Abdominal bloating 05/19/2022   Acute on chronic respiratory failure with hypoxia ([place]) 01/11/2022   Pseudomonas pneumonia ([place]) 01/06/2022   Autonomic dysfunction 01/06/2022   Ventilator dependent ([place]) 01/06/2022   Tracheostomy dependent ([place]) 01/06/2022   Pressure injury of skin 12/27/2021   Cardiac arrest ([place])    Acute hypoxemic respiratory failure ([place])    Septic shock ([place]) 12/09/2021   Anxiety 11/18/2021   Bed sore on buttock, right, unstageable ([place]) 11/18/2021   Severe protein-calorie malnutrition ([place]) 11/13/2021   Microscopic hematuria 10/29/2021   S/P percutaneous endoscopic gastrostomy (PEG) tube placement ([place]) 10/29/2021   ALS (amyotrophic lateral sclerosis) ([place]) 01/11/2019   PCP:  [name], MD Pharmacy:   [facility] [place], [address] [place] [address] Phone: [phone] Fax: [phone]     Social Determinants of Health (SDOH) Social History: SDOH Screenings   Food Insecurity: No Food Insecurity  (01/21/2022)  Housing: Low Risk  (09/04/2020)  Transportation Needs: No Transportation Needs (01/21/2022)  Utilities: Not At Risk (07/02/2022)  Alcohol Screen: Low Risk  (09/04/2020)  Depression (PHQ2-9): Low Risk  (11/13/2021)  Financial Resource Strain: Low Risk  (09/04/2020)  Physical Activity: Inactive (09/04/2020)  Social Connections: Moderately Isolated (09/04/2020)  Stress: No Stress Concern Present (09/04/2020)  Tobacco Use: Low Risk  (06/22/2022)   SDOH Interventions: Utilities Interventions: Intervention Not Indicated   Readmission Risk Interventions     No data to display

## 2022-07-05 DIAGNOSIS — J9621 Acute and chronic respiratory failure with hypoxia: Secondary | ICD-10-CM | POA: Diagnosis not present

## 2022-07-05 DIAGNOSIS — G1221 Amyotrophic lateral sclerosis: Secondary | ICD-10-CM | POA: Diagnosis not present

## 2022-07-05 DIAGNOSIS — R140 Abdominal distension (gaseous): Secondary | ICD-10-CM | POA: Diagnosis not present

## 2022-07-05 DIAGNOSIS — K567 Ileus, unspecified: Secondary | ICD-10-CM | POA: Diagnosis not present

## 2022-07-06 ENCOUNTER — Telehealth: Payer: Self-pay | Admitting: *Deleted

## 2022-07-06 NOTE — Patient Outreach (Signed)
  Care Coordination [facility] Note Transition Care Management Follow-up Telephone Call Date of discharge and from where: [facility] [phone] How have you been since you were released from the hospital? Overall pretty good per wife. He is having some dry mouth Any questions or concerns? Yes Patient wife is concerned with Transportation Patient wife is wanting to know about free water intake. Patient will f/u with PCP since patient does have an ileus Items Reviewed: Did the pt receive and understand the discharge instructions provided? Yes  Medications obtained and verified? Yes  Other? No  Any new allergies since your discharge? No  Dietary orders reviewed? Yes TPN Do you have support at home? Yes   Home Care and Equipment/Supplies: Were home health services ordered? yes If so, what is the name of the agency? [facility]  Has the agency set up a time to come to the patient's home? yes Were any new equipment or medical supplies ordered?  Yes: TPN , Nebulizer  What is the name of the medical supply agency? [employer], [employer] Were you able to get the supplies/equipment? yes Do you have any questions related to the use of the equipment or supplies? Yes:    Functional Questionnaire: (I = Independent and D = Dependent) ADLs: D  Bathing/Dressing- D  Meal Prep- D  Eating- D  Maintaining continence- D  Transferring/Ambulation- D  Managing Meds- D  Follow up appointments reviewed:  PCP Hospital f/u appt confirmed? Yes  Dr Ancil Boozer [id number] 10:25 [facility] f/u appt confirmed? N  Are transportation arrangements needed? Yes  If their condition worsens, is the pt aware to call PCP or go to the Emergency Dept.? Y Was the patient provided with contact information for the PCP's office or ED? Yes Was to pt encouraged to call back with questions or concerns? Yes  SDOH assessments and interventions completed:   Yes SDOH Interventions Today    Flowsheet Row Most Recent Value  SDOH  Interventions   Food Insecurity Interventions Intervention Not Indicated  Housing Interventions Intervention Not Indicated  Transportation Interventions Other (Comment)  [transportation is being worked on with Education officer, museum. Patient wife is also going to check with PTAR]       Care Coordination Interventions:  RN discussed with patient wife about glycerine swabs that help with dry mouth.  Transportation is being worked on by [employer]. Patient wife is going to check with PTAR    Encounter Outcome:  Pt. Visit Completed    [place] Management [phone]

## 2022-07-06 NOTE — Telephone Encounter (Signed)
   Telephone encounter was:  Successful.  07/06/2022 Name: Jerry [name] MRN: [id number] DOB: February 10, 1971  Jerry [name] is a 52 y.o. year old male who is a primary care patient of [name], MD . The community resource team was consulted for assistance with Transportation Needs  Wife talked with Jerry [name] is aware of the need for preatorization by physician Jerry [name] transport  Care guide performed the following interventions: Patient provided with information about care guide support team and interviewed to confirm resource needs.  Follow Up Plan:  No further follow up planned at this time. The patient has been provided with needed resources.Marland Kitchenj

## 2022-07-07 ENCOUNTER — Telehealth: Payer: Self-pay

## 2022-07-07 NOTE — Progress Notes (Deleted)
Name: Jerry [name]   MRN: [id number]    DOB: [birth year]   Date:07/07/2022       Progress Note  [facility] Follow-Up  HPI  Admitted: 06/22/22 Discharged: 07/02/22  Patient Active Problem List   Diagnosis Date Noted   Tracheostomy dependence ([place]) 06/25/2022   Chronic respiratory failure with hypoxia and hypercapnia ([place]) 06/25/2022   Fever 06/22/2022   Chronic respiratory failure with hypoxia ([place]) 06/22/2022   Ileus ([place]) 05/19/2022   Abdominal bloating 05/19/2022   Acute on chronic respiratory failure with hypoxia ([place]) 01/11/2022   Pseudomonas pneumonia ([place]) 01/06/2022   Autonomic dysfunction 01/06/2022   Ventilator dependent ([place]) 01/06/2022   Tracheostomy dependent ([place]) 01/06/2022   Pressure injury of skin 12/27/2021   Cardiac arrest ([place])    Acute hypoxemic respiratory failure ([place])    Septic shock ([place]) 12/09/2021   Anxiety 11/18/2021   Bed sore on buttock, right, unstageable ([place]) 11/18/2021   Severe protein-calorie malnutrition ([place]) 11/13/2021   Microscopic hematuria 10/29/2021   S/P percutaneous endoscopic gastrostomy (PEG) tube placement ([place]) 10/29/2021   ALS (amyotrophic lateral sclerosis) ([place]) 01/11/2019    Past Surgical History:  Procedure Laterality Date   IR REPLACE G-TUBE SIMPLE WO FLUORO  06/22/2022   MASS EXCISION Right 11/26/2014   Procedure: EXCISION MASS/GROIN EXCISION MASS;  Surgeon: Robert Bellow, MD;  Location: [facility] ORS;  Service: General;  Laterality: Right;   MASS EXCISION Right 11/26/2014   Procedure: MINOR EXCISION OF MASS/POST THIGH MASS;  Surgeon: Robert Bellow, MD;  Location: [facility] ORS;  Service: General;  Laterality: Right;   PEG PLACEMENT  10/27/2021   PORTACATH PLACEMENT Right 06/19/2019    Family History  Problem Relation Age of Onset   Diabetes Father    Diabetes Mother    Diabetes Sister    Hypertension Sister    Diabetes Brother    Diabetes Sister    Fibromyalgia Sister    Diabetes  Sister     Social History   Tobacco Use   Smoking status: Never   Smokeless tobacco: Never  Substance Use Topics   Alcohol use: No    Alcohol/week: 0.0 standard drinks of alcohol     Current Outpatient Medications:    acetaminophen (TYLENOL) 325 MG tablet, Place 650 mg into feeding tube every 6 (six) hours as needed for mild pain. (Patient not taking: Reported on 06/23/2022), Disp: , Rfl:    glycopyrrolate (ROBINUL) 1 MG tablet, Place 1 tablet (1 mg total) into feeding tube 2 (two) times daily., Disp: 60 tablet, Rfl: 0   ipratropium-albuterol (DUONEB) 0.5-2.5 (3) MG/3ML SOLN, Take 3 mLs by nebulization every 6 (six) hours as needed (shortness of breath)., Disp: 360 mL, Rfl: 0   polyethylene glycol powder (GLYCOLAX/MIRALAX) 17 GM/SCOOP powder, Place 17 g into feeding tube daily. Titrate as needed for constipation, Disp: 255 g, Rfl: 0   PRESCRIPTION MEDICATION, Home TPN, Disp: , Rfl:    scopolamine (TRANSDERM-SCOP) 1 MG/3DAYS, Place 1 patch (1.5 mg total) onto the skin every 3 (three) days., Disp: 10 patch, Rfl: 0  Allergies  Allergen Reactions   Crab [Shellfish Allergy] Rash    I personally reviewed active problem list, medication list, allergies, family history, social history, health maintenance with the patient/caregiver today.   ROS  ***  Objective  There were no vitals filed for this visit.  There is no height or weight on file to calculate BMI.  Physical Exam ***   PHQ2/9:    11/13/2021  9:03 AM 10/03/2020   10:22 AM 09/04/2020    2:08 PM 06/18/2020    9:15 AM 06/09/2020    8:50 AM  Depression screen PHQ 2/9  Decreased Interest 0 0 0 0 0  Down, Depressed, Hopeless 0 0 0 0 0  PHQ - 2 Score 0 0 0 0 0    phq 9 is {gen pos [id number]}   Fall Risk:    11/18/2021   11:22 AM 11/13/2021    9:03 AM 10/03/2020   10:22 AM 09/04/2020    2:16 PM 06/18/2020    9:15 AM  Fall Risk   Falls in the past year? 0 0 0 0 0  Number falls in past yr:  0 0 0 0  Injury with  Fall?  0 0 0 0  Risk for fall due to : Impaired mobility No Fall Risks Impaired balance/gait;Impaired mobility Impaired balance/gait;Impaired mobility Impaired mobility  Follow up Falls evaluation completed;Education provided Falls prevention discussed Falls prevention discussed Falls prevention discussed       Functional Status Survey:      Assessment & Plan   1. Hospital discharge follow-up ***

## 2022-07-07 NOTE — Telephone Encounter (Signed)
Copied from [place] [phone]. Topic: Appointment Scheduling - Scheduling Inquiry for Clinic >> Jul 07, 2022  4:44 PM Erskine Squibb wrote: Reason for CRM: The spouse of the patient called in stating she spoke with [facility] and needed to talk to her again. The patient has a hospital follow appt tomorrow and she has questions and concerns. Please assist further

## 2022-07-07 NOTE — Telephone Encounter (Signed)
  Care Management   Visit Note  07/07/2022 Name: Jerry [name] MRN: [id number] DOB: June 29, 1971  Subjective: Jerry [name] is a 52 y.o. year old male who is a primary care patient of [name], MD. The Care Management team was consulted for assistance.

## 2022-07-08 ENCOUNTER — Inpatient Hospital Stay: Payer: Medicare HMO | Admitting: Family Medicine

## 2022-07-08 DIAGNOSIS — K567 Ileus, unspecified: Secondary | ICD-10-CM | POA: Diagnosis not present

## 2022-07-08 DIAGNOSIS — R140 Abdominal distension (gaseous): Secondary | ICD-10-CM | POA: Diagnosis not present

## 2022-07-08 DIAGNOSIS — Z09 Encounter for follow-up examination after completed treatment for conditions other than malignant neoplasm: Secondary | ICD-10-CM

## 2022-07-08 DIAGNOSIS — J9621 Acute and chronic respiratory failure with hypoxia: Secondary | ICD-10-CM | POA: Diagnosis not present

## 2022-07-08 DIAGNOSIS — G1221 Amyotrophic lateral sclerosis: Secondary | ICD-10-CM | POA: Diagnosis not present

## 2022-07-08 NOTE — Telephone Encounter (Signed)
Copied from [place] [phone]. Topic: Appointment Scheduling - Scheduling Inquiry for Clinic >> Jul 08, 2022  9:31 AM Cyndi Bender wrote: Reason for CRM: Pt wife Fredda Hammed stated pt will not be coming in for the appt due to insurance will not cover the cost for transportation. Fredda Hammed stated she can not pay the cost upfront so pt will not be able to come in for the appt

## 2022-07-09 DIAGNOSIS — Z432 Encounter for attention to ileostomy: Secondary | ICD-10-CM | POA: Diagnosis not present

## 2022-07-09 DIAGNOSIS — D649 Anemia, unspecified: Secondary | ICD-10-CM | POA: Diagnosis not present

## 2022-07-09 DIAGNOSIS — J151 Pneumonia due to Pseudomonas: Secondary | ICD-10-CM | POA: Diagnosis not present

## 2022-07-09 DIAGNOSIS — G1221 Amyotrophic lateral sclerosis: Secondary | ICD-10-CM | POA: Diagnosis not present

## 2022-07-09 DIAGNOSIS — E46 Unspecified protein-calorie malnutrition: Secondary | ICD-10-CM | POA: Diagnosis not present

## 2022-07-09 DIAGNOSIS — Z7689 Persons encountering health services in other specified circumstances: Secondary | ICD-10-CM | POA: Diagnosis not present

## 2022-07-09 DIAGNOSIS — Z430 Encounter for attention to tracheostomy: Secondary | ICD-10-CM | POA: Diagnosis not present

## 2022-07-09 DIAGNOSIS — J9611 Chronic respiratory failure with hypoxia: Secondary | ICD-10-CM | POA: Diagnosis not present

## 2022-07-09 DIAGNOSIS — Z466 Encounter for fitting and adjustment of urinary device: Secondary | ICD-10-CM | POA: Diagnosis not present

## 2022-07-09 DIAGNOSIS — G8250 Quadriplegia, unspecified: Secondary | ICD-10-CM | POA: Diagnosis not present

## 2022-07-14 ENCOUNTER — Telehealth: Payer: Self-pay

## 2022-07-14 ENCOUNTER — Ambulatory Visit: Payer: Self-pay | Admitting: *Deleted

## 2022-07-14 NOTE — Telephone Encounter (Signed)
Per agent:  "Summary: compression device ?   Maggie from [facility] called in says patient has ALS and family has him using  compression device that plusg in and wants to make sure this is ok for him to use, since she doesn't have an order for it."          Attempted to reach, left VM NT would route to practice for PCPs review. After hours call back.  Please advise.

## 2022-07-15 NOTE — Telephone Encounter (Signed)
Called and let [facility] know. They verbalized understanding and will reach out to his ALS specialist.

## 2022-07-16 DIAGNOSIS — G1221 Amyotrophic lateral sclerosis: Secondary | ICD-10-CM | POA: Diagnosis not present

## 2022-07-16 DIAGNOSIS — R140 Abdominal distension (gaseous): Secondary | ICD-10-CM | POA: Diagnosis not present

## 2022-07-16 DIAGNOSIS — J9621 Acute and chronic respiratory failure with hypoxia: Secondary | ICD-10-CM | POA: Diagnosis not present

## 2022-07-16 DIAGNOSIS — D649 Anemia, unspecified: Secondary | ICD-10-CM | POA: Diagnosis not present

## 2022-07-16 DIAGNOSIS — E46 Unspecified protein-calorie malnutrition: Secondary | ICD-10-CM | POA: Diagnosis not present

## 2022-07-16 DIAGNOSIS — K567 Ileus, unspecified: Secondary | ICD-10-CM | POA: Diagnosis not present

## 2022-07-21 NOTE — Progress Notes (Signed)
Name: Jerry [name]   MRN: [id number]    DOB: April 08, 1971   Date:07/22/2022       Progress Note  Subjective  Chief Complaint  Consult  I connected with  [name]  on 07/22/22 at 10:20 AM EST by a video enabled telemedicine application and verified that I am speaking with the correct person using two identifiers.  I discussed the limitations of evaluation and management by telemedicine and the availability of in person appointments. The patient expressed understanding and agreed to proceed with the virtual visit  Staff also discussed with the patient that there may be a patient responsible charge related to this service. Patient Location: at home  Provider Location: [facility] Additional Individuals present: wife   HPI  ALS: wife did most of the talking, but patient smiled and answered some of the question with his I-gaze device. He was admitted to [facility] last Fall developed ileus, had to get a tracheostomy, he was discharged to a long term care facility and his plan could not extend his stay, therefore he was sent home with one month supply of his medications with tracheostomy, vent , TPN and only a few orders. They currently have a nurse that comes to assist and are getting TPN that wife has to manage at home, pharmacist is ordering labs and reviewing it ( I cannot see labs done after his discharge) He is happy to be home, but wife has been feeling overwhelmed. She states when the trach gets occluded they don't have a 24 hour access for help, they tried to bring him here for in person visit but transportation was costly and did not have any medical personal in the [place]. She did some research and called his insurance, we will try to get a home health agency that offers respiratory therapist, he is having rom exercises with a nurse aid but wife also would like to see if approved for in home PT. Due to ileus and possibility to resume oral nutrition we will refer him back to GI, he is also due for  pulmonologist evaluation   Malnutrition: significant weight loss and drop of albumin, on TPN, he has visible muscle waisting He has a PEG tube but is only used for oral medications at this time  Oral secretions: he needs refill of Robinul to take prn and also transdermal patch that was given while at long term care facility   Chronic respiratory failure with hypoxic: he is dependent of trach and ventilator , needs respiratory therapist to assist his wife .   Patient Active Problem List   Diagnosis Date Noted   Tracheostomy dependence ([place]) 06/25/2022   Chronic respiratory failure with hypoxia and hypercapnia ([place]) 06/25/2022   Fever 06/22/2022   Chronic respiratory failure with hypoxia ([place]) 06/22/2022   Ileus ([place]) 05/19/2022   Abdominal bloating 05/19/2022   Acute on chronic respiratory failure with hypoxia ([place]) 01/11/2022   Pseudomonas pneumonia ([place]) 01/06/2022   Autonomic dysfunction 01/06/2022   Ventilator dependent ([place]) 01/06/2022   Tracheostomy dependent ([place]) 01/06/2022   Pressure injury of skin 12/27/2021   Cardiac arrest [facility])    Acute hypoxemic respiratory failure ([place])    Septic shock ([place]) 12/09/2021   Anxiety 11/18/2021   Bed sore on buttock, right, unstageable ([place]) 11/18/2021   Severe protein-calorie malnutrition ([place]) 11/13/2021   Microscopic hematuria 10/29/2021   S/P percutaneous endoscopic gastrostomy (PEG) tube placement ([place]) 10/29/2021   ALS (amyotrophic lateral sclerosis) ([place]) 01/11/2019    Past Surgical History:  Procedure Laterality Date   IR REPLACE G-TUBE SIMPLE WO FLUORO  06/22/2022   MASS EXCISION Right 11/26/2014   Procedure: EXCISION MASS/GROIN EXCISION MASS;  Surgeon: Robert Bellow, MD;  Location: [facility] ORS;  Service: General;  Laterality: Right;   MASS EXCISION Right 11/26/2014   Procedure: MINOR EXCISION OF MASS/POST THIGH MASS;  Surgeon: Robert Bellow, MD;  Location: [facility] ORS;  Service: General;  Laterality: Right;   PEG PLACEMENT   10/27/2021   PORTACATH PLACEMENT Right 06/19/2019    Family History  Problem Relation Age of Onset   Diabetes Father    Diabetes Mother    Diabetes Sister    Hypertension Sister    Diabetes Brother    Diabetes Sister    Fibromyalgia Sister    Diabetes Sister     Social History   Socioeconomic History   Marital status: Married    Spouse name: [name]   Number of children: 3   Years of education: Not on file   Highest education level: High school graduate  Occupational History   Occupation: Mining engineer    Comment: self employed   Occupation: Ship broker    Comment: [employer]  Tobacco Use   Smoking status: Never   Smokeless tobacco: Never  Scientific laboratory technician Use: Never used  Substance and Sexual Activity   Alcohol use: No    Alcohol/week: 0.0 standard drinks of alcohol   Drug use: No   Sexual activity: Yes    Partners: Female  Other Topics Concern   Not on file  Social History Narrative   Lives with wife and 3 children   Approved for disability 02/2019 for ALS    Social Determinants of Health   Financial Resource Strain: Low Risk  (09/04/2020)   Overall Financial Resource Strain (CARDIA)    Difficulty of Paying Living Expenses: Not very hard  Food Insecurity: No Food Insecurity (07/06/2022)   Hunger Vital Sign    Worried About Running Out of Food in the Last Year: Never true    Ran Out of Food in the Last Year: Never true  Transportation Needs: Unmet Transportation Needs (07/06/2022)   PRAPARE - Hydrologist (Medical): Yes    Lack of Transportation (Non-Medical): No  Physical Activity: Inactive (09/04/2020)   Exercise Vital Sign    Days of Exercise per Week: 0 days    Minutes of Exercise per Session: 0 min  Stress: No Stress Concern Present (09/04/2020)   [place]    Feeling of Stress : Not at all  Social Connections: Moderately Isolated (09/04/2020)   Social  Connection and Isolation Panel [NHANES]    Frequency of Communication with Friends and Family: More than three times a week    Frequency of Social Gatherings with Friends and Family: More than three times a week    Attends Religious Services: Never    Marine scientist or Organizations: No    Attends Archivist Meetings: Never    Marital Status: Married  Human resources officer Violence: Not At Risk (09/04/2020)   Humiliation, Afraid, Rape, and Kick questionnaire    Fear of Current or Ex-Partner: No    Emotionally Abused: No    Physically Abused: No    Sexually Abused: No     Current Outpatient Medications:    ipratropium-albuterol (DUONEB) 0.5-2.5 (3) MG/3ML SOLN, Take 3 mLs by nebulization every 6 (six) hours as needed (shortness of  breath)., Disp: 360 mL, Rfl: 0   polyethylene glycol powder (GLYCOLAX/MIRALAX) 17 GM/SCOOP powder, Place 17 g into feeding tube daily. Titrate as needed for constipation, Disp: 255 g, Rfl: 0   scopolamine (TRANSDERM-SCOP) 1 MG/3DAYS, Place 1 patch (1.5 mg total) onto the skin every 3 (three) days., Disp: 10 patch, Rfl: 0   acetaminophen (TYLENOL) 325 MG tablet, Place 650 mg into feeding tube every 6 (six) hours as needed for mild pain. (Patient not taking: Reported on 06/23/2022), Disp: , Rfl:    glycopyrrolate (ROBINUL) 1 MG tablet, Place 1 tablet (1 mg total) into feeding tube 2 (two) times daily. (Patient not taking: Reported on 07/22/2022), Disp: 60 tablet, Rfl: 0   PRESCRIPTION MEDICATION, Home TPN (Patient not taking: Reported on 07/22/2022), Disp: , Rfl:   Allergies  Allergen Reactions   Crab [Shellfish Allergy] Rash    I personally reviewed active problem list, medication list, allergies, family history, social history, health maintenance with the patient/caregiver today.   ROS  Ten systems reviewed and is negative except as mentioned in HPI   Objective  Virtual encounter, vitals not obtained.  There is no height or weight on file to  calculate BMI.  Physical Exam  Awake, alert and oriented, smiling, visibly thin with temporal waisting   PHQ2/9:    07/22/2022    9:46 AM 11/13/2021    9:03 AM 10/03/2020   10:22 AM 09/04/2020    2:08 PM 06/18/2020    9:15 AM  Depression screen PHQ 2/9  Decreased Interest 0 0 0 0 0  Down, Depressed, Hopeless 0 0 0 0 0  PHQ - 2 Score 0 0 0 0 0   PHQ-2/9 Result is negative.    Fall Risk:    07/22/2022    9:43 AM 11/18/2021   11:22 AM 11/13/2021    9:03 AM 10/03/2020   10:22 AM 09/04/2020    2:16 PM  Fall Risk   Falls in the past year? Exclusion - non ambulatory 0 0 0 0  Number falls in past yr:   0 0 0  Injury with Fall?   0 0 0  Risk for fall due to : Impaired mobility;Impaired balance/gait Impaired mobility No Fall Risks Impaired balance/gait;Impaired mobility Impaired balance/gait;Impaired mobility  Follow up Falls prevention discussed Falls evaluation completed;Education provided Falls prevention discussed Falls prevention discussed Falls prevention discussed     Assessment & Plan  1. ALS (amyotrophic lateral sclerosis) ([place])  - Ambulatory referral to Pulmonology - polyethylene glycol powder (GLYCOLAX/MIRALAX) 17 GM/SCOOP powder; Place 17 g into feeding tube daily. Titrate as needed for constipation  Dispense: 255 g; Refill: 5 - glycopyrrolate (ROBINUL) 1 MG tablet; Place 1 tablet (1 mg total) into feeding tube 2 (two) times daily as needed.  Dispense: 60 tablet; Refill: 5 - ipratropium-albuterol (DUONEB) 0.5-2.5 (3) MG/3ML SOLN; Take 3 mLs by nebulization every 6 (six) hours as needed (shortness of breath).  Dispense: 360 mL; Refill: 0 - scopolamine (TRANSDERM-SCOP) 1 MG/3DAYS; Place 1 patch (1.5 mg total) onto the skin every 3 (three) days.  Dispense: 10 patch; Refill: 5 - Ambulatory referral to Gastroenterology  2. Tracheostomy dependent [facility])  - Ambulatory referral to Pulmonology - Ambulatory referral to Home Health  3. Severe protein-calorie malnutrition  (HCC)  Continue TPN  4. On total parenteral nutrition (TPN)   5. Ileus ([place])  - Ambulatory referral to Gastroenterology  6. S/P percutaneous endoscopic gastrostomy (PEG) tube placement ([place])  Only used for medication at this time  7. Ventilator dependent (HCC)  Stable at this time   8. Chronic respiratory failure with hypoxia (HCC)  On ventilator    I discussed the assessment and treatment plan with the patient. The patient was provided an opportunity to ask questions and all were answered. The patient agreed with the plan and demonstrated an understanding of the instructions.  The patient was advised to call back or seek an in-person evaluation if the symptoms worsen or if the condition fails to improve as anticipated.  I provided 35  minutes of non-face-to-face time during this encounter.

## 2022-07-22 ENCOUNTER — Telehealth (INDEPENDENT_AMBULATORY_CARE_PROVIDER_SITE_OTHER): Payer: Medicare HMO | Admitting: Family Medicine

## 2022-07-22 ENCOUNTER — Encounter: Payer: Self-pay | Admitting: Family Medicine

## 2022-07-22 ENCOUNTER — Telehealth: Payer: Self-pay | Admitting: Family Medicine

## 2022-07-22 DIAGNOSIS — J9611 Chronic respiratory failure with hypoxia: Secondary | ICD-10-CM

## 2022-07-22 DIAGNOSIS — Z930 Tracheostomy status: Secondary | ICD-10-CM | POA: Diagnosis not present

## 2022-07-22 DIAGNOSIS — G1221 Amyotrophic lateral sclerosis: Secondary | ICD-10-CM | POA: Diagnosis not present

## 2022-07-22 DIAGNOSIS — Z9911 Dependence on respirator [ventilator] status: Secondary | ICD-10-CM

## 2022-07-22 DIAGNOSIS — J9621 Acute and chronic respiratory failure with hypoxia: Secondary | ICD-10-CM | POA: Diagnosis not present

## 2022-07-22 DIAGNOSIS — Z931 Gastrostomy status: Secondary | ICD-10-CM

## 2022-07-22 DIAGNOSIS — E43 Unspecified severe protein-calorie malnutrition: Secondary | ICD-10-CM

## 2022-07-22 DIAGNOSIS — R140 Abdominal distension (gaseous): Secondary | ICD-10-CM | POA: Diagnosis not present

## 2022-07-22 DIAGNOSIS — K567 Ileus, unspecified: Secondary | ICD-10-CM

## 2022-07-22 DIAGNOSIS — Z789 Other specified health status: Secondary | ICD-10-CM

## 2022-07-22 MED ORDER — SCOPOLAMINE 1 MG/3DAYS TD PT72: 1.0000 | MEDICATED_PATCH | TRANSDERMAL | 5 refills | Status: DC

## 2022-07-22 MED ORDER — POLYETHYLENE GLYCOL 3350 17 GM/SCOOP PO POWD: 17.0000 g | Freq: Every day | ORAL | 5 refills | Status: DC

## 2022-07-22 MED ORDER — IPRATROPIUM-ALBUTEROL 0.5-2.5 (3) MG/3ML IN SOLN: 3.0000 mL | Freq: Four times a day (QID) | RESPIRATORY_TRACT | 0 refills | Status: DC | PRN

## 2022-07-22 MED ORDER — GLYCOPYRROLATE 1 MG PO TABS: 1.0000 mg | ORAL_TABLET | Freq: Two times a day (BID) | ORAL | 5 refills | Status: DC | PRN

## 2022-07-22 NOTE — Telephone Encounter (Unsigned)
Copied from [place] [phone]. Topic: Referral - Request for Referral >> Jul 22, 2022  3:58 PM Leone Payor F wrote: Referral Request - Has patient seen PCP for this complaint? Yes.   Preferred provider/office: [place]  Reason for referral: Home Health and Physician bedside

## 2022-07-23 ENCOUNTER — Telehealth: Payer: Self-pay

## 2022-07-23 DIAGNOSIS — E46 Unspecified protein-calorie malnutrition: Secondary | ICD-10-CM | POA: Diagnosis not present

## 2022-07-23 NOTE — Telephone Encounter (Signed)
Per Dr. Ancil Boozer we do not order the labs as mentioned to them previously. The contact number they put for Threasa Beards is actually his wife's number. I let [name] know we do not order those labs, but Threasa Beards had already completed her visit and left.

## 2022-07-23 NOTE — Telephone Encounter (Signed)
Copied from [place] [phone]. Topic: General - Other >> Jul 23, 2022 12:16 PM [place] B wrote: Reason for CRM: Threasa Beards from [facility] asking what labs should be done but they are no orders. Please call back when orders put in.

## 2022-07-29 DIAGNOSIS — R140 Abdominal distension (gaseous): Secondary | ICD-10-CM | POA: Diagnosis not present

## 2022-07-29 DIAGNOSIS — J9621 Acute and chronic respiratory failure with hypoxia: Secondary | ICD-10-CM | POA: Diagnosis not present

## 2022-07-29 DIAGNOSIS — G1221 Amyotrophic lateral sclerosis: Secondary | ICD-10-CM | POA: Diagnosis not present

## 2022-07-29 DIAGNOSIS — K567 Ileus, unspecified: Secondary | ICD-10-CM | POA: Diagnosis not present

## 2022-07-30 ENCOUNTER — Telehealth: Payer: Self-pay

## 2022-07-30 DIAGNOSIS — G1221 Amyotrophic lateral sclerosis: Secondary | ICD-10-CM | POA: Diagnosis not present

## 2022-07-30 DIAGNOSIS — G8250 Quadriplegia, unspecified: Secondary | ICD-10-CM | POA: Diagnosis not present

## 2022-07-30 DIAGNOSIS — J151 Pneumonia due to Pseudomonas: Secondary | ICD-10-CM | POA: Diagnosis not present

## 2022-07-30 LAB — BASIC METABOLIC PANEL
BUN: 23 — AB (ref 4–21)
CO2: 22 (ref 13–22)
Chloride: 101 (ref 99–108)
Creatinine: 0.3 — AB (ref 0.6–1.3)
Glucose: 115
Potassium: 3.8 mEq/L (ref 3.5–5.1)
Sodium: 138 (ref 137–147)

## 2022-07-30 LAB — HEPATIC FUNCTION PANEL
ALT: 25 U/L (ref 10–40)
AST: 19 (ref 14–40)
Alkaline Phosphatase: 135 — AB (ref 25–125)
Bilirubin, Total: 0.6

## 2022-07-30 LAB — CBC AND DIFFERENTIAL
HCT: 29 — AB (ref 41–53)
Hemoglobin: 9.7 — AB (ref 13.5–17.5)
Neutrophils Absolute: 75
Platelets: 115 10*3/uL — AB (ref 150–400)
WBC: 6.9

## 2022-07-30 LAB — CBC: RBC: 3.55 — AB (ref 3.87–5.11)

## 2022-07-30 LAB — COMPREHENSIVE METABOLIC PANEL: eGFR: 153

## 2022-08-03 ENCOUNTER — Encounter: Payer: Self-pay | Admitting: Family Medicine

## 2022-08-04 ENCOUNTER — Other Ambulatory Visit: Payer: Self-pay | Admitting: Family Medicine

## 2022-08-04 ENCOUNTER — Other Ambulatory Visit: Payer: Self-pay

## 2022-08-04 ENCOUNTER — Telehealth: Payer: Self-pay

## 2022-08-04 ENCOUNTER — Telehealth: Payer: Self-pay | Admitting: Family Medicine

## 2022-08-04 DIAGNOSIS — G1221 Amyotrophic lateral sclerosis: Secondary | ICD-10-CM

## 2022-08-04 DIAGNOSIS — Z9911 Dependence on respirator [ventilator] status: Secondary | ICD-10-CM

## 2022-08-04 DIAGNOSIS — E43 Unspecified severe protein-calorie malnutrition: Secondary | ICD-10-CM

## 2022-08-04 DIAGNOSIS — Z930 Tracheostomy status: Secondary | ICD-10-CM

## 2022-08-04 NOTE — Telephone Encounter (Unsigned)
Copied from [place] [phone]. Topic: Referral - Request for Referral >> Aug 04, 2022  2:04 PM Everette C wrote: Reason for CRM: Almyra Free with [facility] has called to request a faxed referral for the patient's stretcher transport be faxed to [phone]  Please contact further if needed

## 2022-08-04 NOTE — Telephone Encounter (Signed)
Faxed signed order to [facility]

## 2022-08-05 ENCOUNTER — Encounter: Payer: Self-pay | Admitting: Family Medicine

## 2022-08-06 ENCOUNTER — Telehealth: Payer: Self-pay | Admitting: Family Medicine

## 2022-08-06 DIAGNOSIS — G1221 Amyotrophic lateral sclerosis: Secondary | ICD-10-CM | POA: Diagnosis not present

## 2022-08-06 DIAGNOSIS — J9621 Acute and chronic respiratory failure with hypoxia: Secondary | ICD-10-CM | POA: Diagnosis not present

## 2022-08-06 DIAGNOSIS — R140 Abdominal distension (gaseous): Secondary | ICD-10-CM | POA: Diagnosis not present

## 2022-08-06 DIAGNOSIS — G8250 Quadriplegia, unspecified: Secondary | ICD-10-CM | POA: Diagnosis not present

## 2022-08-06 DIAGNOSIS — K567 Ileus, unspecified: Secondary | ICD-10-CM | POA: Diagnosis not present

## 2022-08-06 DIAGNOSIS — J151 Pneumonia due to Pseudomonas: Secondary | ICD-10-CM | POA: Diagnosis not present

## 2022-08-06 NOTE — Telephone Encounter (Unsigned)
Copied from [place] [phone]. Topic: Referral - Status >> Aug 05, 2022  4:46 PM Cyndi Bender wrote: Reason for CRM: Almyra Free with [facility] requests that a referral be sent to [facility] [phone]

## 2022-08-06 NOTE — Telephone Encounter (Signed)
Sent to [facility], fax number [phone]

## 2022-08-11 NOTE — Telephone Encounter (Addendum)
Almyra Free with [employer] has called to request an update. Stated [facility] has not received the fax for the patient's stretcher transport.Stated that the number she provided [phone]) is their phone number, not their fax number. Almyra Free does not have the fax number. Almyra Free asked for this to be resent to [facility] Attention. Mateo Flow and when done, please make pt caregiver aware. Walid Haig, Wife: [phone]   Please advise.

## 2022-08-11 NOTE — Telephone Encounter (Signed)
Order re-printed and faxed to [facility] attn: Mateo Flow to fax #: [phone] Called with no answer for wife to be notified

## 2022-08-12 ENCOUNTER — Telehealth (HOSPITAL_COMMUNITY): Payer: Self-pay

## 2022-08-12 ENCOUNTER — Telehealth: Payer: Self-pay

## 2022-08-12 ENCOUNTER — Telehealth: Payer: Self-pay | Admitting: Family Medicine

## 2022-08-12 ENCOUNTER — Emergency Department (HOSPITAL_COMMUNITY): Payer: Medicare HMO

## 2022-08-12 ENCOUNTER — Encounter (HOSPITAL_COMMUNITY): Payer: Self-pay

## 2022-08-12 ENCOUNTER — Emergency Department (HOSPITAL_COMMUNITY)
Admission: EM | Admit: 2022-08-12 | Discharge: 2022-08-13 | Disposition: A | Discharge status: Home / Self Care | Payer: Medicare HMO | Attending: Emergency Medicine | Admitting: Emergency Medicine

## 2022-08-12 DIAGNOSIS — G1221 Amyotrophic lateral sclerosis: Secondary | ICD-10-CM | POA: Diagnosis not present

## 2022-08-12 DIAGNOSIS — R140 Abdominal distension (gaseous): Secondary | ICD-10-CM | POA: Diagnosis present

## 2022-08-12 DIAGNOSIS — E876 Hypokalemia: Secondary | ICD-10-CM | POA: Insufficient documentation

## 2022-08-12 DIAGNOSIS — J189 Pneumonia, unspecified organism: Secondary | ICD-10-CM | POA: Diagnosis not present

## 2022-08-12 DIAGNOSIS — J690 Pneumonitis due to inhalation of food and vomit: Secondary | ICD-10-CM | POA: Diagnosis not present

## 2022-08-12 DIAGNOSIS — R404 Transient alteration of awareness: Secondary | ICD-10-CM | POA: Diagnosis not present

## 2022-08-12 DIAGNOSIS — K6389 Other specified diseases of intestine: Secondary | ICD-10-CM | POA: Diagnosis not present

## 2022-08-12 DIAGNOSIS — J9811 Atelectasis: Secondary | ICD-10-CM | POA: Diagnosis not present

## 2022-08-12 DIAGNOSIS — N200 Calculus of kidney: Secondary | ICD-10-CM | POA: Diagnosis not present

## 2022-08-12 DIAGNOSIS — R059 Cough, unspecified: Secondary | ICD-10-CM | POA: Diagnosis not present

## 2022-08-12 DIAGNOSIS — J9611 Chronic respiratory failure with hypoxia: Secondary | ICD-10-CM | POA: Diagnosis not present

## 2022-08-12 DIAGNOSIS — D649 Anemia, unspecified: Secondary | ICD-10-CM | POA: Diagnosis not present

## 2022-08-12 DIAGNOSIS — E46 Unspecified protein-calorie malnutrition: Secondary | ICD-10-CM | POA: Diagnosis not present

## 2022-08-12 LAB — COMPREHENSIVE METABOLIC PANEL
ALT: 35 U/L (ref 0–44)
AST: 25 U/L (ref 15–41)
Albumin: 3.6 g/dL (ref 3.5–5.0)
Alkaline Phosphatase: 119 U/L (ref 38–126)
Anion gap: 9 (ref 5–15)
BUN: 24 mg/dL — ABNORMAL HIGH (ref 6–20)
CO2: 25 mmol/L (ref 22–32)
Calcium: 9.6 mg/dL (ref 8.9–10.3)
Chloride: 100 mmol/L (ref 98–111)
Creatinine, Ser: <0.3 mg/dL — ABNORMAL LOW (ref 0.61–1.24)
Glucose, Bld: 93 mg/dL (ref 70–99)
Potassium: 3.1 mmol/L — ABNORMAL LOW (ref 3.5–5.1)
Sodium: 134 mmol/L — ABNORMAL LOW (ref 135–145)
Total Bilirubin: 0.6 mg/dL (ref 0.3–1.2)
Total Protein: 8.4 g/dL — ABNORMAL HIGH (ref 6.5–8.1)

## 2022-08-12 LAB — URINALYSIS, ROUTINE W REFLEX MICROSCOPIC
Bilirubin Urine: NEGATIVE
Glucose, UA: NEGATIVE mg/dL
Hgb urine dipstick: NEGATIVE
Ketones, ur: NEGATIVE mg/dL
Leukocytes,Ua: NEGATIVE
Nitrite: NEGATIVE
Protein, ur: NEGATIVE mg/dL
Specific Gravity, Urine: 1.013 (ref 1.005–1.030)
pH: 7 (ref 5.0–8.0)

## 2022-08-12 LAB — CBC WITH DIFFERENTIAL/PLATELET
Abs Immature Granulocytes: 0.02 10*3/uL (ref 0.00–0.07)
Basophils Absolute: 0 10*3/uL (ref 0.0–0.1)
Basophils Relative: 1 %
Eosinophils Absolute: 0.1 10*3/uL (ref 0.0–0.5)
Eosinophils Relative: 2 %
HCT: 33 % — ABNORMAL LOW (ref 39.0–52.0)
Hemoglobin: 11.1 g/dL — ABNORMAL LOW (ref 13.0–17.0)
Immature Granulocytes: 0 %
Lymphocytes Relative: 29 %
Lymphs Abs: 1.6 10*3/uL (ref 0.7–4.0)
MCH: 27.9 pg (ref 26.0–34.0)
MCHC: 33.6 g/dL (ref 30.0–36.0)
MCV: 82.9 fL (ref 80.0–100.0)
Monocytes Absolute: 0.4 10*3/uL (ref 0.1–1.0)
Monocytes Relative: 7 %
Neutro Abs: 3.4 10*3/uL (ref 1.7–7.7)
Neutrophils Relative %: 61 %
Platelets: 158 10*3/uL (ref 150–400)
RBC: 3.98 MIL/uL — ABNORMAL LOW (ref 4.22–5.81)
RDW: 16.8 % — ABNORMAL HIGH (ref 11.5–15.5)
WBC: 5.5 10*3/uL (ref 4.0–10.5)
nRBC: 0 % (ref 0.0–0.2)

## 2022-08-12 MED ORDER — SODIUM CHLORIDE 0.9% FLUSH: 10.0000 mL | INTRAVENOUS | Status: DC | PRN

## 2022-08-12 MED ORDER — SODIUM CHLORIDE 0.9% FLUSH
10.0000 mL | Freq: Two times a day (BID) | INTRAVENOUS | Status: DC
  Administered 2022-08-12: 10 mL

## 2022-08-12 MED ORDER — CHLORHEXIDINE GLUCONATE CLOTH 2 % EX PADS: 6.0000 | MEDICATED_PAD | Freq: Every day | CUTANEOUS | Status: DC

## 2022-08-12 MED ORDER — IOHEXOL 350 MG/ML SOLN
75.0000 mL | Freq: Once | INTRAVENOUS | Status: AC | PRN
  Administered 2022-08-12: 75 mL via INTRAVENOUS

## 2022-08-12 MED ORDER — LEVOFLOXACIN 750 MG PO TABS: 750.0000 mg | ORAL_TABLET | Freq: Every day | ORAL | 0 refills | Status: DC

## 2022-08-12 MED ORDER — LEVOFLOXACIN 750 MG PO TABS
750.0000 mg | ORAL_TABLET | Freq: Once | ORAL | Status: AC
  Administered 2022-08-12: 750 mg
  Filled 2022-08-12: qty 1

## 2022-08-12 MED ORDER — HEPARIN SOD (PORK) LOCK FLUSH 100 UNIT/ML IV SOLN
500.0000 [IU] | Freq: Once | INTRAVENOUS | Status: AC
  Administered 2022-08-13: 500 [IU]
  Filled 2022-08-12: qty 5

## 2022-08-12 NOTE — Telephone Encounter (Signed)
  Care Management   Visit Note  08/12/2022 Name: Rachard Isidro MRN: [id number] DOB: 05/07/1971  Subjective: Loden Laurent is a 52 y.o. year old male who is a primary care patient of [name], MD. The Care Management team was consulted for assistance.      Engaged with Mr. Miklas spouse/caregiver by telephone.   Assessment:  Message received from Mr. Matsushita's spouse/caregiver.  Reports he has complained of bloating for the past few days and requested that home health nursing staff open his peg tube. Also reports increased abdominal swelling. Reports drainage from peg is usually "frothy." Reports checking again today and the drainage is green.  Mr. Nyoka Cowden is currently receiving TPN via PICC. Unable to receive feeds via peg d/t ileus. He remains immobile and ventilator dependent.  Spouse advised of provider's recommendation to have Mr. Green evaluated in the Emergency Room. Spouse agreeable to plan.   PLAN:  A member of the care management team will follow up.   Horris Latino RN Care Manager/Chronic Care Management [phone]

## 2022-08-12 NOTE — Telephone Encounter (Signed)
Called pt's wife to find out if she wanted [place] to do pt's 6 month f/u gtube exchange. No answer, left vm AB

## 2022-08-12 NOTE — ED Triage Notes (Signed)
Pt BIB [facility] from home d/t possible infection in his peg tube. Pt has ALS and is on vent at home. Pt 's wife noticed Odor w green discharge from his peg tube.  VSS.

## 2022-08-12 NOTE — ED Provider Notes (Signed)
Jerry [name] Provider Note   CSN: [id number] Arrival date & time: 08/12/22  1844     History  Chief Complaint  Patient presents with   infection in peg tube    Jerry [name] is a 52 y.o. male.  HPI   This patient is a 52 year old male, he has a history of ALS, he has been admitted to the hospital multiple times in the past for a variety of things including respiratory arrest back in June 2023.  He was then seen in November 2023 with an ileus, he presents back today with a complaint of having some green discharge in the PEG tube with some abdominal distention.  His significant other who cares for him at home is never seen anything like this which is what concerned her.  He is not vomiting, not having any fevers and no difficulty breathing.  He arrives by paramedic transport without any abnormal vital signs  Home Medications Prior to Admission medications   Medication Sig Start Date End Date Taking? Authorizing Provider  levofloxacin (LEVAQUIN) 750 MG tablet Place 1 tablet (750 mg total) into feeding tube daily. Crush for tube feeds. 08/12/22  Yes Noemi Chapel, MD  glycopyrrolate (ROBINUL) 1 MG tablet Place 1 tablet (1 mg total) into feeding tube 2 (two) times daily as needed. 07/22/22   [name], MD  ipratropium-albuterol (DUONEB) 0.5-2.5 (3) MG/3ML SOLN Take 3 mLs by nebulization every 6 (six) hours as needed (shortness of breath). 07/22/22 08/21/22  [name], MD  polyethylene glycol powder (GLYCOLAX/MIRALAX) 17 GM/SCOOP powder Place 17 g into feeding tube daily. Titrate as needed for constipation 07/22/22   [name], MD  PRESCRIPTION MEDICATION Home TPN Patient not taking: Reported on 07/22/2022    [provider]  scopolamine (TRANSDERM-SCOP) 1 MG/3DAYS Place 1 patch (1.5 mg total) onto the skin every 3 (three) days. 07/22/22   [name], MD      Allergies    Otho Darner allergy]    Review of Systems    Review of Systems  All other systems reviewed and are negative.   Physical Exam Updated Vital Signs BP 105/73   Pulse 76   Temp 98.7 F (37.1 C) (Oral)   Resp 16   SpO2 100%  Physical Exam Vitals and nursing note reviewed.  Constitutional:      General: He is not in acute distress.    Appearance: He is well-developed.  HENT:     Head: Normocephalic and atraumatic.     Nose: No congestion or rhinorrhea.     Mouth/Throat:     Pharynx: No oropharyngeal exudate.     Comments: Oropharynx appears clear Eyes:     General: No scleral icterus.       Right eye: No discharge.        Left eye: No discharge.     Conjunctiva/sclera: Conjunctivae normal.     Pupils: Pupils are equal, round, and reactive to light.  Neck:     Thyroid: No thyromegaly.     Vascular: No JVD.  Cardiovascular:     Rate and Rhythm: Normal rate and regular rhythm.     Heart sounds: Normal heart sounds. No murmur heard.    No friction rub. No gallop.  Pulmonary:     Effort: Pulmonary effort is normal. No respiratory distress.     Breath sounds: Rhonchi present. No wheezing or rales.  Abdominal:     General: Bowel sounds are normal. There is distension.  Palpations: Abdomen is soft. There is no mass.     Tenderness: There is no abdominal tenderness.     Comments: PEG tube in place, tympanitic sounds to percussion diffusely.  No ttp.  Musculoskeletal:        General: No tenderness. Normal range of motion.     Cervical back: Normal range of motion and neck supple.     Right lower leg: No edema.     Left lower leg: No edema.  Lymphadenopathy:     Cervical: No cervical adenopathy.  Skin:    General: Skin is warm and dry.     Findings: No erythema or rash.  Neurological:     Mental Status: He is alert.     Coordination: Coordination normal.     Comments: Moves eyes, flaccid arms and legs.  Psychiatric:        Behavior: Behavior normal.     ED Results / Procedures / Treatments   Labs (all labs  ordered are listed, but only abnormal results are displayed) Labs Reviewed  CBC WITH DIFFERENTIAL/PLATELET - Abnormal; Notable for the following components:      Result Value   RBC 3.98 (*)    Hemoglobin 11.1 (*)    HCT 33.0 (*)    RDW 16.8 (*)    All other components within normal limits  COMPREHENSIVE METABOLIC PANEL - Abnormal; Notable for the following components:   Sodium 134 (*)    Potassium 3.1 (*)    BUN 24 (*)    Creatinine, Ser <0.30 (*)    Total Protein 8.4 (*)    All other components within normal limits  URINALYSIS, ROUTINE W REFLEX MICROSCOPIC - Abnormal; Notable for the following components:   APPearance CLOUDY (*)    All other components within normal limits    EKG None  Radiology CT ABDOMEN PELVIS W CONTRAST  Result Date: 08/12/2022 CLINICAL DATA:  Discharge around PEG tube, history of ALS EXAM: CT ABDOMEN AND PELVIS WITH CONTRAST TECHNIQUE: Multidetector CT imaging of the abdomen and pelvis was performed using the standard protocol following bolus administration of intravenous contrast. RADIATION DOSE REDUCTION: This exam was performed according to the departmental dose-optimization program which includes automated exposure control, adjustment of the mA and/or kV according to patient size and/or use of iterative reconstruction technique. CONTRAST:  18m OMNIPAQUE IOHEXOL 350 MG/ML SOLN COMPARISON:  08/12/2022, 03/28/2022 FINDINGS: Lower chest: Dense consolidation within the left lower lobe is unchanged. Stable right lower lobe airspace disease most consistent with recurrent aspiration given clinical history of ALS. Distal margin of a venous catheter is seen within the right atrium. Hepatobiliary: No focal liver abnormality is seen. No gallstones, gallbladder wall thickening, or biliary dilatation. Pancreas: Unremarkable. No pancreatic ductal dilatation or surrounding inflammatory changes. Spleen: Normal in size without focal abnormality. Adrenals/Urinary Tract: Stable  punctate bilateral nonobstructing renal calculi. No obstructive uropathy within either kidney. The adrenals are grossly normal. The bladder is decompressed, limiting its evaluation. Stomach/Bowel: Marked gaseous distention of the colon is again noted unchanged. No bowel obstruction or ileus. Percutaneous gastrostomy tube is identified, with balloon inflated within the gastric body. No fluid collections surrounding the gastrostomy tube to suggest abscess or superimposed infection. Vascular/Lymphatic: No significant vascular findings are present. No enlarged abdominal or pelvic lymph nodes. Reproductive: Prostate is unremarkable. Other: No free fluid or free intraperitoneal gas. No abdominal wall hernia. Musculoskeletal: No acute or destructive bony lesions. Reconstructed images demonstrate no additional findings. IMPRESSION: 1. Percutaneous gastrostomy tube within the lumen of  the gastric body, with no evidence of complication. 2. Marked gaseous distention of the colon, a nonspecific. No evidence of bowel obstruction. 3. Persistent multifocal right lower lobe airspace disease most consistent with recurrent aspiration given history of ALS. 4. Chronic left lower lobe consolidation favoring atelectasis. Electronically Signed   By: Randa Ngo M.D.   On: 08/12/2022 21:41   DG Chest Port 1 View  Result Date: 08/12/2022 CLINICAL DATA:  cough EXAM: PORTABLE CHEST 1 VIEW COMPARISON:  Chest x-ray 06/23/2022 FINDINGS: Tracheostomy with tip terminating 4 cm above the carina. Right chest wall Port-A-Cath with tip just distal to the superior cavoatrial junction. The heart and mediastinal contours are unchanged. Elevated left hemidiaphragm. Persistent bibasilar atelectasis. No focal consolidation. No pulmonary edema. No pleural effusion. No pneumothorax. No acute osseous abnormality. IMPRESSION: Persistent bibasilar atelectasis. Superimposed infection/inflammation not excluded. Consider CT chest with intravenous contrast for  further evaluation. Electronically Signed   By: Iven Finn M.D.   On: 08/12/2022 19:27    Procedures Procedures    Medications Ordered in ED Medications  Chlorhexidine Gluconate Cloth 2 % PADS 6 each (6 each Topical Not Given 08/12/22 2123)  sodium chloride flush (NS) 0.9 % injection 10-40 mL (has no administration in time range)  sodium chloride flush (NS) 0.9 % injection 10-40 mL (10 mLs Intracatheter Given 08/12/22 2123)  levofloxacin (LEVAQUIN) tablet 750 mg (has no administration in time range)  iohexol (OMNIPAQUE) 350 MG/ML injection 75 mL (75 mLs Intravenous Contrast Given 08/12/22 2133)    ED Course/ Medical Decision Making/ A&P                             Medical Decision Making Amount and/or Complexity of Data Reviewed Labs: ordered. Radiology: ordered.  Risk OTC drugs. Prescription drug management.   This patient presents to the ED for concern of abdominal distention, this involves an extensive number of treatment options, and is a complaint that carries with it a high risk of complications and morbidity.  The differential diagnosis includes obstruction, ileus, perforation, diverticulitis, PEG tube dislodgment   Co morbidities that complicate the patient evaluation  ALS   Additional history obtained:  Additional history obtained from family member and medical record External records from outside source obtained and reviewed including prior to mission to the hospital   Lab Tests:  I Ordered, and personally interpreted labs.  The pertinent results include: Urinalysis without acute findings, CBC without anemia or leukocytosis, metabolic panel with mild hypokalemia but no other acute significant findings.   Imaging Studies ordered:  I ordered imaging studies including CT scan of the abdomen and pelvis I independently visualized and interpreted imaging which showed some dilated large bowel without signs of obstruction.  There does appear to be a consolidation  in the left lower lobe which is unchanged and a stable right lower airspace disease which appears to be consistent with recurrent aspiration pneumonia. I agree with the radiologist interpretation   Cardiac Monitoring: / EKG:  The patient was maintained on a cardiac monitor.  I personally viewed and interpreted the cardiac monitored which showed an underlying rhythm of: Normal sinus rhythm  Consultations Obtained:  I requested consultation with the clinical pharmacist, they recommend Levaquin to cover for Pseudomonas as the patient has had Pseudomonas with regards to the trach,  and discussed lab and imaging findings as well as pertinent plan - they recommend: Levaquin   Problem List / ED Course / Critical interventions /  Medication management  Patient has stable vital signs, spouse and patient agreeable and understanding of what is going on.  There is no signs of bowel obstruction, they concerned about the green material in the PEG tube but at this time it does not appear to be related to obstruction or infection.  The tube was appropriately placed in the lumen of the stomach.  Levaquin given I ordered medication including Levaquin for pneumonia Reevaluation of the patient after these medicines showed that the patient stable I have reviewed the patients home medicines and have made adjustments as needed   Social Determinants of Health:  ALS, vent dependent   Test / Admission - Considered:  Consider admission but patient appears stable, family comfortable taking patient home and administering antibiotics         Final Clinical Impression(s) / ED Diagnoses Final diagnoses:  Aspiration pneumonia of lower lobe, unspecified aspiration pneumonia type, unspecified laterality ([place])  Colon distention    Rx / DC Orders ED Discharge Orders          Ordered    levofloxacin (LEVAQUIN) 750 MG tablet  Daily        08/12/22 2158              Noemi Chapel, MD 08/12/22  2159

## 2022-08-12 NOTE — Telephone Encounter (Signed)
Home Health Verbal Orders - Caller/AgencyTerrilyn [name] with [facility]  Callback Number: [phone] leave a message  Requesting OT/PT/Skilled Nursing/Social Work/Speech Therapy: Nursing weekly and PT eval   Order for an alternating pressure mattress

## 2022-08-12 NOTE — Discharge Instructions (Signed)
Your testing shows no signs of bowel blockage.  It does show that you have a pneumonia in the bottom of your lung.  You will need to take the medication called Levaquin once a day, crushed this and put it through the tube once a day.  Do it at the same time every day.  See your doctor within 3 days for recheck, ER for worsening symptoms

## 2022-08-12 NOTE — Telephone Encounter (Signed)
Copied from [place] [phone]. Topic: Transportation - Transportation >> Aug 12, 2022 10:36 AM Leone Payor F wrote: Reason for CRM: Tammy from [facility] called in stating she received a fax in regards to patient needing transport but she doesn't see an order for it. Flow coordinator advised to send a message over for confirmation.

## 2022-08-13 DIAGNOSIS — R140 Abdominal distension (gaseous): Secondary | ICD-10-CM | POA: Diagnosis not present

## 2022-08-13 DIAGNOSIS — G1221 Amyotrophic lateral sclerosis: Secondary | ICD-10-CM | POA: Diagnosis not present

## 2022-08-13 DIAGNOSIS — G8250 Quadriplegia, unspecified: Secondary | ICD-10-CM | POA: Diagnosis not present

## 2022-08-13 DIAGNOSIS — K567 Ileus, unspecified: Secondary | ICD-10-CM | POA: Diagnosis not present

## 2022-08-13 DIAGNOSIS — Z7401 Bed confinement status: Secondary | ICD-10-CM | POA: Diagnosis not present

## 2022-08-13 DIAGNOSIS — J9621 Acute and chronic respiratory failure with hypoxia: Secondary | ICD-10-CM | POA: Diagnosis not present

## 2022-08-13 DIAGNOSIS — J690 Pneumonitis due to inhalation of food and vomit: Secondary | ICD-10-CM | POA: Diagnosis not present

## 2022-08-13 NOTE — Telephone Encounter (Signed)
Verbal orders given  

## 2022-08-14 LAB — HEPATIC FUNCTION PANEL
ALT: 30 U/L (ref 10–40)
AST: 20 (ref 14–40)
Alkaline Phosphatase: 139 — AB (ref 25–125)
Bilirubin, Total: 0.6

## 2022-08-14 LAB — BASIC METABOLIC PANEL
CO2: 20 (ref 13–22)
Chloride: 98 — AB (ref 99–108)
Potassium: 3.3 mEq/L — AB (ref 3.5–5.1)
Sodium: 137 (ref 137–147)

## 2022-08-14 LAB — COMPREHENSIVE METABOLIC PANEL
Albumin: 3.9 (ref 3.5–5.0)
Calcium: 9.4 (ref 8.7–10.7)
Globulin: 3.8

## 2022-08-14 LAB — CBC: RBC: 3.55 — AB (ref 3.87–5.11)

## 2022-08-14 LAB — CBC AND DIFFERENTIAL
HCT: 29 — AB (ref 41–53)
Hemoglobin: 9.9 — AB (ref 13.5–17.5)
Neutrophils Absolute: 68
Platelets: 127 10*3/uL — AB (ref 150–400)
WBC: 5

## 2022-08-14 LAB — LIPID PANEL
Cholesterol: 79 (ref 0–200)
Triglycerides: 41 (ref 40–160)

## 2022-08-17 ENCOUNTER — Telehealth: Payer: Self-pay | Admitting: Family Medicine

## 2022-08-17 NOTE — Telephone Encounter (Unsigned)
Copied from [place] [id number]. Topic: General - Other >> Aug 17, 2022 11:55 AM Cyndi Bender wrote: Reason for CRM: Avel Peace with [facility] [place] [facility] requests that the patient's last office notes along with his medical history and physical be sent to them at [phone]

## 2022-08-17 NOTE — Telephone Encounter (Unsigned)
Copied from [place]. Topic: Quick Communication - Home Health Verbal Orders >> Aug 17, 2022 11:56 AM Cyndi Bender wrote: Caller/Agency: Avel Peace with [facility] Number: [phone] Requesting OT/PT/Skilled Nursing/Social Work/Speech Therapy: nursing  Frequency: start of care

## 2022-08-17 NOTE — Telephone Encounter (Signed)
Last virtual ov notes as well as last in-person notes faxed. All other records requests forwarded to medical records and referred Junie Panning to medical records to obtain if necessary.

## 2022-08-18 NOTE — Telephone Encounter (Signed)
The number provided to complete order is not in service.

## 2022-08-19 ENCOUNTER — Telehealth: Payer: Self-pay | Admitting: Family Medicine

## 2022-08-19 DIAGNOSIS — R140 Abdominal distension (gaseous): Secondary | ICD-10-CM | POA: Diagnosis not present

## 2022-08-19 DIAGNOSIS — E876 Hypokalemia: Secondary | ICD-10-CM | POA: Diagnosis not present

## 2022-08-19 DIAGNOSIS — K567 Ileus, unspecified: Secondary | ICD-10-CM | POA: Diagnosis not present

## 2022-08-19 DIAGNOSIS — D649 Anemia, unspecified: Secondary | ICD-10-CM | POA: Diagnosis not present

## 2022-08-19 DIAGNOSIS — G1221 Amyotrophic lateral sclerosis: Secondary | ICD-10-CM | POA: Diagnosis not present

## 2022-08-19 DIAGNOSIS — J9621 Acute and chronic respiratory failure with hypoxia: Secondary | ICD-10-CM | POA: Diagnosis not present

## 2022-08-19 LAB — CBC AND DIFFERENTIAL
HCT: 30 — AB (ref 41–53)
Hemoglobin: 9.9 — AB (ref 13.5–17.5)
Neutrophils Absolute: 4.8
Platelets: 178 10*3/uL (ref 150–400)
WBC: 6.6

## 2022-08-19 LAB — HEPATIC FUNCTION PANEL
ALT: 31 U/L (ref 10–40)
AST: 25 (ref 14–40)
Alkaline Phosphatase: 143 — AB (ref 25–125)
Bilirubin, Total: 0.7

## 2022-08-19 LAB — BASIC METABOLIC PANEL
BUN: 24 — AB (ref 4–21)
CO2: 21 (ref 13–22)
Chloride: 96 — AB (ref 99–108)
Creatinine: 0.2 — AB (ref 0.6–1.3)
Glucose: 84
Potassium: 3.2 mEq/L — AB (ref 3.5–5.1)
Sodium: 136 — AB (ref 137–147)

## 2022-08-19 LAB — COMPREHENSIVE METABOLIC PANEL
Albumin: 4.1 (ref 3.5–5.0)
Calcium: 9.5 (ref 8.7–10.7)
Globulin: 3.4
eGFR: 164

## 2022-08-19 LAB — CBC: RBC: 3.67 — AB (ref 3.87–5.11)

## 2022-08-19 NOTE — Telephone Encounter (Signed)
Copied from [place]. Topic: Quick Communication - Home Health Verbal Orders >> Aug 19, 2022  2:20 PM Marcellus Scott wrote: Caller/Agency: Junie Panning / [facility] Callback Number: [phone] Requesting OT/PT/Skilled Nursing/Social Work/Speech Therapy:  Skilled Nursing Frequency: 36 hours a week of continued in-home of skilled nursing to begin 12/17  Junie Panning stated that she went out today to do an assessment. Requesting verbal to start Saturday. Stated 36 hours were transferred from [facility], who is already in the home.  Please advise.

## 2022-08-20 NOTE — Telephone Encounter (Signed)
Completed.

## 2022-08-23 ENCOUNTER — Encounter: Payer: Self-pay | Admitting: Family Medicine

## 2022-08-24 ENCOUNTER — Telehealth: Payer: Self-pay | Admitting: Family Medicine

## 2022-08-24 NOTE — Telephone Encounter (Signed)
Contacted Jerry [name] to schedule their annual wellness visit. Appointment made for 09/24/2022.  [place] Direct Dial: [phone]

## 2022-08-26 DIAGNOSIS — D649 Anemia, unspecified: Secondary | ICD-10-CM | POA: Diagnosis not present

## 2022-08-26 DIAGNOSIS — R1310 Dysphagia, unspecified: Secondary | ICD-10-CM | POA: Diagnosis not present

## 2022-08-26 DIAGNOSIS — Z452 Encounter for adjustment and management of vascular access device: Secondary | ICD-10-CM | POA: Diagnosis not present

## 2022-08-26 DIAGNOSIS — Z430 Encounter for attention to tracheostomy: Secondary | ICD-10-CM | POA: Diagnosis not present

## 2022-08-26 DIAGNOSIS — J9612 Chronic respiratory failure with hypercapnia: Secondary | ICD-10-CM | POA: Diagnosis not present

## 2022-08-26 DIAGNOSIS — J9621 Acute and chronic respiratory failure with hypoxia: Secondary | ICD-10-CM | POA: Diagnosis not present

## 2022-08-26 DIAGNOSIS — G1221 Amyotrophic lateral sclerosis: Secondary | ICD-10-CM | POA: Diagnosis not present

## 2022-08-26 DIAGNOSIS — G1220 Motor neuron disease, unspecified: Secondary | ICD-10-CM | POA: Diagnosis not present

## 2022-08-26 DIAGNOSIS — K567 Ileus, unspecified: Secondary | ICD-10-CM | POA: Diagnosis not present

## 2022-08-26 DIAGNOSIS — R140 Abdominal distension (gaseous): Secondary | ICD-10-CM | POA: Diagnosis not present

## 2022-08-26 DIAGNOSIS — G8250 Quadriplegia, unspecified: Secondary | ICD-10-CM | POA: Diagnosis not present

## 2022-08-26 DIAGNOSIS — E46 Unspecified protein-calorie malnutrition: Secondary | ICD-10-CM | POA: Diagnosis not present

## 2022-08-26 DIAGNOSIS — F419 Anxiety disorder, unspecified: Secondary | ICD-10-CM | POA: Diagnosis not present

## 2022-08-26 DIAGNOSIS — J9611 Chronic respiratory failure with hypoxia: Secondary | ICD-10-CM | POA: Diagnosis not present

## 2022-08-27 LAB — LIPID PANEL
Cholesterol: 94 (ref 0–200)
Triglycerides: 36 — AB (ref 40–160)

## 2022-08-27 LAB — VITAMIN D 25 HYDROXY (VIT D DEFICIENCY, FRACTURES): Vit D, 25-Hydroxy: 19.7

## 2022-08-28 DIAGNOSIS — K56609 Unspecified intestinal obstruction, unspecified as to partial versus complete obstruction: Secondary | ICD-10-CM | POA: Diagnosis not present

## 2022-08-28 DIAGNOSIS — G1221 Amyotrophic lateral sclerosis: Secondary | ICD-10-CM | POA: Diagnosis not present

## 2022-08-28 DIAGNOSIS — R627 Adult failure to thrive: Secondary | ICD-10-CM | POA: Diagnosis not present

## 2022-08-28 DIAGNOSIS — J9690 Respiratory failure, unspecified, unspecified whether with hypoxia or hypercapnia: Secondary | ICD-10-CM | POA: Diagnosis not present

## 2022-08-31 LAB — IRON,TIBC AND FERRITIN PANEL
%SAT: 40
Iron: 93
TIBC: 232
UIBC: 139

## 2022-08-31 LAB — CBC AND DIFFERENTIAL: HCT: 33 — AB (ref 41–53)

## 2022-09-02 DIAGNOSIS — G1221 Amyotrophic lateral sclerosis: Secondary | ICD-10-CM | POA: Diagnosis not present

## 2022-09-02 DIAGNOSIS — Z452 Encounter for adjustment and management of vascular access device: Secondary | ICD-10-CM | POA: Diagnosis not present

## 2022-09-02 DIAGNOSIS — E46 Unspecified protein-calorie malnutrition: Secondary | ICD-10-CM | POA: Diagnosis not present

## 2022-09-03 DIAGNOSIS — J9621 Acute and chronic respiratory failure with hypoxia: Secondary | ICD-10-CM | POA: Diagnosis not present

## 2022-09-03 DIAGNOSIS — G1221 Amyotrophic lateral sclerosis: Secondary | ICD-10-CM | POA: Diagnosis not present

## 2022-09-03 DIAGNOSIS — R140 Abdominal distension (gaseous): Secondary | ICD-10-CM | POA: Diagnosis not present

## 2022-09-03 DIAGNOSIS — K567 Ileus, unspecified: Secondary | ICD-10-CM | POA: Diagnosis not present

## 2022-09-09 ENCOUNTER — Institutional Professional Consult (permissible substitution): Payer: Medicare HMO | Admitting: Pulmonary Disease

## 2022-09-09 DIAGNOSIS — Z452 Encounter for adjustment and management of vascular access device: Secondary | ICD-10-CM | POA: Diagnosis not present

## 2022-09-09 DIAGNOSIS — E46 Unspecified protein-calorie malnutrition: Secondary | ICD-10-CM | POA: Diagnosis not present

## 2022-09-09 DIAGNOSIS — G1221 Amyotrophic lateral sclerosis: Secondary | ICD-10-CM | POA: Diagnosis not present

## 2022-09-09 DIAGNOSIS — Z4781 Encounter for orthopedic aftercare following surgical amputation: Secondary | ICD-10-CM | POA: Diagnosis not present

## 2022-09-09 DIAGNOSIS — J9611 Chronic respiratory failure with hypoxia: Secondary | ICD-10-CM | POA: Diagnosis not present

## 2022-09-09 DIAGNOSIS — Z430 Encounter for attention to tracheostomy: Secondary | ICD-10-CM | POA: Diagnosis not present

## 2022-09-09 DIAGNOSIS — D649 Anemia, unspecified: Secondary | ICD-10-CM | POA: Diagnosis not present

## 2022-09-09 DIAGNOSIS — F419 Anxiety disorder, unspecified: Secondary | ICD-10-CM | POA: Diagnosis not present

## 2022-09-09 DIAGNOSIS — G8250 Quadriplegia, unspecified: Secondary | ICD-10-CM | POA: Diagnosis not present

## 2022-09-10 ENCOUNTER — Encounter: Payer: Self-pay | Admitting: Family Medicine

## 2022-09-10 DIAGNOSIS — G1221 Amyotrophic lateral sclerosis: Secondary | ICD-10-CM | POA: Diagnosis not present

## 2022-09-10 DIAGNOSIS — R140 Abdominal distension (gaseous): Secondary | ICD-10-CM | POA: Diagnosis not present

## 2022-09-10 DIAGNOSIS — K567 Ileus, unspecified: Secondary | ICD-10-CM | POA: Diagnosis not present

## 2022-09-10 DIAGNOSIS — J9621 Acute and chronic respiratory failure with hypoxia: Secondary | ICD-10-CM | POA: Diagnosis not present

## 2022-09-13 ENCOUNTER — Encounter: Payer: Self-pay | Admitting: Family Medicine

## 2022-09-14 ENCOUNTER — Telehealth: Payer: Self-pay

## 2022-09-14 ENCOUNTER — Other Ambulatory Visit: Payer: Self-pay

## 2022-09-14 ENCOUNTER — Telehealth: Payer: Self-pay | Admitting: *Deleted

## 2022-09-14 DIAGNOSIS — G1221 Amyotrophic lateral sclerosis: Secondary | ICD-10-CM

## 2022-09-14 DIAGNOSIS — J9611 Chronic respiratory failure with hypoxia: Secondary | ICD-10-CM

## 2022-09-14 DIAGNOSIS — E43 Unspecified severe protein-calorie malnutrition: Secondary | ICD-10-CM

## 2022-09-14 NOTE — Progress Notes (Signed)
  Care Coordination   Note   09/14/2022 Name: Jerry [name] MRN: [id number] DOB: 1971-03-22  Jerry [name] is a 52 y.o. year old male who sees [name], MD for primary care. I reached out to [name] by phone today to offer care coordination services.  [name] was given information about Care Coordination services today including:   The Care Coordination services include support from the care team which includes your Nurse Coordinator, Clinical Social Worker, or Pharmacist.  The Care Coordination team is here to help remove barriers to the health concerns and goals most important to you. Care Coordination services are voluntary, and the patient may decline or stop services at any time by request to their care team member.   Care Coordination Consent Status: Patient agreed to services and verbal consent obtained.   Follow up plan:  Telephone appointment with care coordination team member scheduled for:  09/29/2022  Encounter Outcome:  Pt. Scheduled  Julian Hy, [place] Direct Dial: [phone]

## 2022-09-16 DIAGNOSIS — G8250 Quadriplegia, unspecified: Secondary | ICD-10-CM | POA: Diagnosis not present

## 2022-09-16 DIAGNOSIS — J9612 Chronic respiratory failure with hypercapnia: Secondary | ICD-10-CM | POA: Diagnosis not present

## 2022-09-16 DIAGNOSIS — D649 Anemia, unspecified: Secondary | ICD-10-CM | POA: Diagnosis not present

## 2022-09-16 DIAGNOSIS — Z430 Encounter for attention to tracheostomy: Secondary | ICD-10-CM | POA: Diagnosis not present

## 2022-09-16 DIAGNOSIS — E46 Unspecified protein-calorie malnutrition: Secondary | ICD-10-CM | POA: Diagnosis not present

## 2022-09-16 DIAGNOSIS — R1310 Dysphagia, unspecified: Secondary | ICD-10-CM | POA: Diagnosis not present

## 2022-09-16 DIAGNOSIS — F419 Anxiety disorder, unspecified: Secondary | ICD-10-CM | POA: Diagnosis not present

## 2022-09-16 DIAGNOSIS — Z452 Encounter for adjustment and management of vascular access device: Secondary | ICD-10-CM | POA: Diagnosis not present

## 2022-09-16 DIAGNOSIS — J9611 Chronic respiratory failure with hypoxia: Secondary | ICD-10-CM | POA: Diagnosis not present

## 2022-09-16 DIAGNOSIS — G1221 Amyotrophic lateral sclerosis: Secondary | ICD-10-CM | POA: Diagnosis not present

## 2022-09-17 DIAGNOSIS — G1221 Amyotrophic lateral sclerosis: Secondary | ICD-10-CM | POA: Diagnosis not present

## 2022-09-17 DIAGNOSIS — R140 Abdominal distension (gaseous): Secondary | ICD-10-CM | POA: Diagnosis not present

## 2022-09-17 DIAGNOSIS — K567 Ileus, unspecified: Secondary | ICD-10-CM | POA: Diagnosis not present

## 2022-09-17 DIAGNOSIS — J9621 Acute and chronic respiratory failure with hypoxia: Secondary | ICD-10-CM | POA: Diagnosis not present

## 2022-09-23 DIAGNOSIS — D649 Anemia, unspecified: Secondary | ICD-10-CM | POA: Diagnosis not present

## 2022-09-23 DIAGNOSIS — R1310 Dysphagia, unspecified: Secondary | ICD-10-CM | POA: Diagnosis not present

## 2022-09-23 DIAGNOSIS — F419 Anxiety disorder, unspecified: Secondary | ICD-10-CM | POA: Diagnosis not present

## 2022-09-23 DIAGNOSIS — Z452 Encounter for adjustment and management of vascular access device: Secondary | ICD-10-CM | POA: Diagnosis not present

## 2022-09-23 DIAGNOSIS — Z8701 Personal history of pneumonia (recurrent): Secondary | ICD-10-CM | POA: Diagnosis not present

## 2022-09-23 DIAGNOSIS — J9611 Chronic respiratory failure with hypoxia: Secondary | ICD-10-CM | POA: Diagnosis not present

## 2022-09-23 DIAGNOSIS — G1221 Amyotrophic lateral sclerosis: Secondary | ICD-10-CM | POA: Diagnosis not present

## 2022-09-23 DIAGNOSIS — E46 Unspecified protein-calorie malnutrition: Secondary | ICD-10-CM | POA: Diagnosis not present

## 2022-09-23 DIAGNOSIS — E876 Hypokalemia: Secondary | ICD-10-CM | POA: Diagnosis not present

## 2022-09-23 DIAGNOSIS — J9612 Chronic respiratory failure with hypercapnia: Secondary | ICD-10-CM | POA: Diagnosis not present

## 2022-09-23 LAB — BASIC METABOLIC PANEL
BUN: 28 — AB (ref 4–21)
CO2: 24 — AB (ref 13–22)
Chloride: 95 — AB (ref 99–108)
Creatinine: 0.3 — AB (ref 0.6–1.3)
Glucose: 123
Potassium: 3.2 mEq/L — AB (ref 3.5–5.1)
Sodium: 137 (ref 137–147)

## 2022-09-23 LAB — CBC: RBC: 4.02 (ref 3.87–5.11)

## 2022-09-23 LAB — CBC AND DIFFERENTIAL
HCT: 33 — AB (ref 41–53)
Hemoglobin: 11.4 — AB (ref 13.5–17.5)
Platelets: 113 10*3/uL — AB (ref 150–400)
WBC: 5.2

## 2022-09-23 LAB — LIPID PANEL: Triglycerides: 42 (ref 40–160)

## 2022-09-23 LAB — HEPATIC FUNCTION PANEL
Alkaline Phosphatase: 170 — AB (ref 25–125)
Bilirubin, Total: 1.2

## 2022-09-24 ENCOUNTER — Telehealth: Payer: Self-pay

## 2022-09-24 ENCOUNTER — Ambulatory Visit: Payer: Medicare HMO

## 2022-09-24 DIAGNOSIS — R140 Abdominal distension (gaseous): Secondary | ICD-10-CM | POA: Diagnosis not present

## 2022-09-24 DIAGNOSIS — G1221 Amyotrophic lateral sclerosis: Secondary | ICD-10-CM | POA: Diagnosis not present

## 2022-09-24 DIAGNOSIS — K567 Ileus, unspecified: Secondary | ICD-10-CM | POA: Diagnosis not present

## 2022-09-24 DIAGNOSIS — J9621 Acute and chronic respiratory failure with hypoxia: Secondary | ICD-10-CM | POA: Diagnosis not present

## 2022-09-24 LAB — COMPREHENSIVE METABOLIC PANEL
Albumin: 4.2 (ref 3.5–5.0)
Calcium: 9.7 (ref 8.7–10.7)
Globulin: 3.3
eGFR: 150

## 2022-09-24 NOTE — Telephone Encounter (Signed)
Dr. Ancil Boozer reviewed recent lab results (abstracted in EPIC) advised repeat lab draw in 48 hours due to elevated liver enzyme. She wanted to make sure someone was managing this or if she needed to do so. Awaiting return call.

## 2022-09-24 NOTE — Telephone Encounter (Signed)
09/24/2022 11:00 AM EDT by Roger Shelter, LPN  Riccardo Dubin (Self) [phone] (Home) Remove  Left Message - AWV scheduled 11am; called pt personal vm full:unable to leave a msg  09/24/2022 11:02 AM EDT by Roger Shelter, LPN  Outgoing [place], [place] (Self) [phone] (Mobile) Remove  Left Message - left generic msg to call our office re:AWV  09/24/2022 11:18 AM EDT by Roger Shelter, LPN  Outgoing [name] (Self) [phone] (Mobile) Remove  Left Message - to call to r/s awv

## 2022-09-25 DIAGNOSIS — D6949 Other primary thrombocytopenia: Secondary | ICD-10-CM | POA: Insufficient documentation

## 2022-09-28 ENCOUNTER — Telehealth: Payer: Self-pay

## 2022-09-28 NOTE — Telephone Encounter (Signed)
Copied from [place] [phone]. Topic: General - Other >> Sep 28, 2022 11:22 AM Sabas Sous wrote: Reason for CRM: Junie Panning called reporting that the patient is due for a trach change, and they do not have orders to do it. The last orders state an MD must do it but the pt's wife says they do not have the means for transportation to the clinic.  Erin calling [place] contact: [phone]

## 2022-09-29 ENCOUNTER — Ambulatory Visit: Payer: Medicare HMO

## 2022-09-29 ENCOUNTER — Encounter: Payer: Medicare HMO | Admitting: *Deleted

## 2022-09-29 DIAGNOSIS — J9600 Acute respiratory failure, unspecified whether with hypoxia or hypercapnia: Secondary | ICD-10-CM | POA: Diagnosis not present

## 2022-09-29 DIAGNOSIS — I5022 Chronic systolic (congestive) heart failure: Secondary | ICD-10-CM | POA: Diagnosis not present

## 2022-09-29 DIAGNOSIS — G1221 Amyotrophic lateral sclerosis: Secondary | ICD-10-CM

## 2022-09-29 DIAGNOSIS — J9611 Chronic respiratory failure with hypoxia: Secondary | ICD-10-CM

## 2022-09-29 DIAGNOSIS — E43 Unspecified severe protein-calorie malnutrition: Secondary | ICD-10-CM

## 2022-09-29 DIAGNOSIS — N179 Acute kidney failure, unspecified: Secondary | ICD-10-CM | POA: Diagnosis not present

## 2022-09-29 DIAGNOSIS — I4891 Unspecified atrial fibrillation: Secondary | ICD-10-CM | POA: Diagnosis not present

## 2022-09-29 DIAGNOSIS — I10 Essential (primary) hypertension: Secondary | ICD-10-CM

## 2022-09-29 DIAGNOSIS — J189 Pneumonia, unspecified organism: Secondary | ICD-10-CM | POA: Diagnosis not present

## 2022-09-29 NOTE — Plan of Care (Signed)
Chronic Care Management Provider Comprehensive Care Plan    09/29/2022 Name: Jerry [name] MRN: [id number] DOB: 02-Jan-1971  Referral to Chronic Care Management (CCM) services was placed by Provider: [name], MD  on Date: 09/14/22  Chronic Condition 1: Chronic Respiratory Failure/Secondary to Amyotrophic Lateral Sclerosis Provider Assessment and Plan : Chronic respiratory failure with hypoxia (HCC) On ventilator    Tracheostomy dependent (HCC) - Ambulatory referral to Pulmonology - Ambulatory referral to Home Health - - ipratropium-albuterol (DUONEB) 0.5-2.5 (3) MG/3ML SOLN; Take 3 mLs by nebulization every 6 (six) hours as needed (shortness of breath).  Dispense: 360 mL; Refill: 0 - scopolamine (TRANSDERM-SCOP) 1 MG/3DAYS; Place 1 patch (1.5 mg total) onto the skin every 3 (three) days.  Dispense: 10 patch; Refill: 5 - Expected Outcome/Goals Addressed This Visit (Provider CCM goals/Provider Assessment and plan  Goal: CCM (Chronic Respiratory Failure) Expected Outcome:  Monitor, Self-Manage And Reduce Symptoms of Chronic Respiratory Failure secondary to ALS  Symptom Management Condition 1: Continue communications with [employer] regarding options for non-emergent ambulance transport for medical appointments Administer all medications as prescribed Assess symptoms daily and do not leave patient unattended Ensure patient is properly positioned to use Eye Gaze device Continue communications with the Pulmonology team Notify Dr. Darleene Cleaver or his assigned PA if patient non urgent evaluation Follow recommendations to prevent respiratory infection Notify provider or care management team with questions and new concerns as needed   Chronic Condition 2: Severe Protein Calorie Malnutrition Provider Assessment and Plan Severe protein-calorie malnutrition (HCC) Continue TPN - Ambulatory referral to Gastroenterology  Expected Outcome/Goals Addressed This Visit (Provider CCM goals/Provider  Assessment and plan  Goal: CCM (Severe Protein Calorie Malnutrition) Expected Outcome:  Monitor, Self-Manage And Reduce Symptoms of Severe Protein Calorie Malnutrition secondary to ALS  Symptom Management Condition 2: Administer all medications as prescribed Continue communications with [facility] regarding covered options for non-emergent ambulance transport Continue administering TPN as prescribed. Follow instructions for PICC flushing  Monitor for s/sx of central line infection and notify provider as recommended Contact provider or care management team with questions and new concerns as needed.   Problem List Patient Active Problem List   Diagnosis Date Noted   Chronic respiratory failure with hypoxia (HCC) 06/22/2022   Ileus (HCC) 05/19/2022   Autonomic dysfunction 01/06/2022   Ventilator dependent (HCC) 01/06/2022   Tracheostomy dependent (HCC) 01/06/2022   Pressure injury of skin 12/27/2021   Anxiety 11/18/2021   Bed sore on buttock, right, unstageable (HCC) 11/18/2021   Severe protein-calorie malnutrition (HCC) 11/13/2021   Microscopic hematuria 10/29/2021   S/P percutaneous endoscopic gastrostomy (PEG) tube placement (HCC) 10/29/2021   ALS (amyotrophic lateral sclerosis) (HCC) 01/11/2019    Medication Management  Current Outpatient Medications:    glycopyrrolate (ROBINUL) 1 MG tablet, Place 1 tablet (1 mg total) into feeding tube 2 (two) times daily as needed., Disp: 60 tablet, Rfl: 5   ipratropium-albuterol (DUONEB) 0.5-2.5 (3) MG/3ML SOLN, Take 3 mLs by nebulization every 6 (six) hours as needed (shortness of breath)., Disp: 360 mL, Rfl: 0   levofloxacin (LEVAQUIN) 750 MG tablet, Place 1 tablet (750 mg total) into feeding tube daily. Crush for tube feeds., Disp: 7 tablet, Rfl: 0   polyethylene glycol powder (GLYCOLAX/MIRALAX) 17 GM/SCOOP powder, Place 17 g into feeding tube daily. Titrate as needed for constipation, Disp: 255 g, Rfl: 5   PRESCRIPTION MEDICATION, Home TPN  (Patient not taking: Reported on 07/22/2022), Disp: , Rfl:    scopolamine (TRANSDERM-SCOP) 1 MG/3DAYS, Place 1 patch (1.5 mg  total) onto the skin every 3 (three) days., Disp: 10 patch, Rfl: 5  Cognitive Assessment Identity Confirmed: : Name; DOB (Patient Nonverbal/Outreach with spouse) Cognitive Status: Normal   Functional Assessment Hearing Difficulty or Deaf: no Wear Glasses or Blind: no Concentrating, Remembering or Making Decisions Difficulty (CP): no Difficulty Communicating: yes Communication: unable to speak Communication Management: Requires automated device/simulator d/t ALS Difficulty Eating/Swallowing: yes Eating/Swallowing Management: Requires parental nutrition Walking or Climbing Stairs Difficulty: yes Walking or Climbing Stairs: ambulation difficulty, dependent Mobility Management: Not able to ambulate Dressing/Bathing Difficulty: yes Dressing/Bathing: bathing difficulty, dependent; dressing difficulty, dependent Dressing/Bathing Management: Dependent/Unable to perform  ADLs independently (Has in home nursing and aides) Doing Errands Independently Difficulty (such as shopping) (CP): yes Errands Management: Spouse performing   Caregiver Assessment  Primary Source of Support/Comfort: child(ren); spouse; nonrelative caregiver; friend Name of Support/Comfort Primary Source: Jerry [name]/spouse People in Home: child(ren), dependent; spouse Family Caregiver if Needed: spouse Family Caregiver Names: Ozzie Hoyle   Planned Interventions  Chronic Respiratory Failure/Secondary to Amyotrophic Lateral Sclerosis Reviewed plan for management of Chronic Respiratory Failure r/t ALS. He remains tracheostomy and ventilator dependent. Spouse reports in-home services are currently being provided by Respiratory Therapy with [facility]. Voices concerns that visits are infrequent and prefers to establish services with an agency can that provide more oversight and trach care. Several  attempts were made to identify an agency, however most services were not covered by the insurance and would require private pay.  Previously collaborated with the insurance provider/[employer] and local agencies to establish care with a provider that can make home visits. Confirmed outreach with Dr. Darleene Cleaver and his Physician Assistant. This team will provide oversight until the insurance provider identifies a team that is covered for in-home respiratory therapy. Discussed symptoms. Spouse reports patient's symptoms have been well controlled. She is comfortable administering nebulizer and trouble shooting the ventilator as needed.  Reviewed action plan. Patient is able to communicate needs via his Eye Gaze device. Confirmed patient has around the clock care-continues to receive nursing support from [place] with family filling in gaps when coverage is not available.  Discussed infection prevention and patient's increased risks. Advised to utilize prevention strategies to reduce risk of respiratory infection. Pulmonology referral was placed by PCP. Appointment for evaluation has not been scheduled d/t need for stretcher transport.  Pending feedback from [facility] regarding coverage for non-emergent ambulance transport. Thorough discussion of respiratory symptoms that require emergent medical attention.   Severe Protein Calorie Malnutrition Discussed current plan for management of Severe Protein Calorie Malnutrition. A peg tube is in place however it has not been used for feedings since patient was diagnosed with an ileus last fall. Currently receiving TPN via PICC. Spouse reports the [facility] does not provide oversight of TPN. Reports [employer] provides the TPN solution and monitors labs.  Discussed preference to return to feedings via peg tube. Aware that he will need to be assessed by the Gastroenterology team to determine is ileus has cleared. Will also need to establish a feeding plan based on patient's current  weight and nutritional requirements. PCP placed a referral for Gastroenterology. Patient will require transport via stretcher.  Appointment is pending availability of non-emergent ambulance transport. Advised to continue working with [facility] to determine which transportation is covered. Discussed current symptoms and TPN management. Spouse is comfortable changing TPN bags and completing routine PICC flushing. Thorough discussion regarding s/sx of central line infection and indications for seeking medical attention.  Reports he has been managing TPN well. He has  experienced significant weight loss and muscle wasting but spouse feels he is currently stable. Will follow up with the Gastroenterology team regarding plan for evaluation. Discussed need to contact Dr. Darleene Cleaver or his assigned PA if patient require non urgent evaluation in the home.  Reviewed worsening s/sx that require immediate medical attention.  Interaction and coordination with outside resources, practitioners, and providers See CCM Referral  Care Plan: Available in MyChart

## 2022-09-29 NOTE — Chronic Care Management (AMB) (Signed)
Chronic Care Management   CCM RN Visit Note  09/29/2022 Name: Jerry [name] MRN: [id number] DOB: Dec 22, 1970  Subjective: Jerry [name] is a 52 y.o. year old male who is a primary care patient of Jerry Cory, MD. The patient was referred to the Chronic Care Management team for assistance with care management needs subsequent to provider initiation of CCM services and plan of care.    Today's Visit:  Engaged with patient by telephone for initial visit.     SDOH Interventions Today    Flowsheet Row Most Recent Value  SDOH Interventions   Food Insecurity Interventions Intervention Not Indicated  Housing Interventions Intervention Not Indicated  Transportation Interventions Other (Comment)  [Bedbound/Requires stretcher transport via ambulance for medical appointments. Current health plan has not covered requested transportation]  Utilities Interventions Intervention Not Indicated  Alcohol Usage Interventions Intervention Not Indicated (Score <7)  Financial Strain Interventions Intervention Not Indicated  Physical Activity Interventions Other (Comments)  [Patient bedbound/]  Social Connections Interventions Other (Comment)  [Currently bedbound/]        Goals Addressed             This Visit's Progress    Goal: CCM (Chronic Respiratory Failure) Expected Outcome:  Monitor, Self-Manage And Reduce Symptoms of Chronic Respiratory Failure secondary to ALS       Current Barriers:  Chronic Disease Management support and education needs related to  Transportation barriers  Planned Interventions: Reviewed plan for management of Chronic Respiratory Failure r/t ALS. He remains tracheostomy and ventilator dependent. Spouse reports in-home services are currently being provided by Respiratory Therapy with [facility]. Voices concerns that visits are infrequent and prefers to establish services with an agency can that provide more oversight and trach care. Several attempts were made to identify an  agency, however most services were not covered by the insurance and would require private pay.  Previously collaborated with the insurance provider/[employer] and local agencies to establish care with a provider that can make home visits. Confirmed outreach with Dr. [name] and his Physician Assistant. This team will provide oversight until the insurance provider identifies a team that is covered for in-home respiratory therapy. Discussed symptoms. Spouse reports patient's symptoms have been well controlled. She is comfortable administering nebulizer and trouble shooting the ventilator as needed.  Reviewed action plan. Patient is able to communicate needs via his Eye Gaze device. Confirmed patient has around the clock care-continues to receive nursing support from [place] with family filling in gaps when coverage is not available.  Discussed infection prevention and patient's increased risks. Advised to utilize prevention strategies to reduce risk of respiratory infection. Pulmonology referral was placed by PCP. Appointment for evaluation has not been scheduled d/t need for stretcher transport.  Pending feedback from [facility] regarding coverage for non-emergent ambulance transport. Thorough discussion of respiratory symptoms that require emergent medical attention.   Symptom Management: Continue communications with [employer] regarding options for non-emergent ambulance transport for medical appointments Administer all medications as prescribed Assess symptoms daily and do not leave patient unattended Ensure patient is properly positioned to use Eye Gaze device Continue communications with the Pulmonology team Notify Dr. [name] or his assigned PA if patient non urgent evaluation Follow recommendations to prevent respiratory infection Notify provider or care management team with questions and new concerns as needed  Follow Up Plan:        Goal: CCM (Severe Protein Calorie Malnutrition) Expected Outcome:   Monitor, Self-Manage And Reduce Symptoms of Severe Protein Calorie Malnutrition secondary to ALS  Current Barriers:  Chronic Disease Management support and education needs related to Severe Protein Calorie Malnutrition Transportation barriers  Planned Interventions: Discussed current plan for management of Severe Protein Calorie Malnutrition. A peg tube is in place however it has not been used for feedings since patient was diagnosed with an ileus last fall. Currently receiving TPN via PICC. Spouse reports the [facility] does not provide oversight of TPN. Reports [employer] provides the TPN solution and monitors labs.  Discussed preference to return to feedings via peg tube. Aware that he will need to be assessed by the Gastroenterology team to determine is ileus has cleared. Will also need to establish a feeding plan based on patient's current weight and nutritional requirements. PCP placed a referral for Gastroenterology. Patient will require transport via stretcher.  Appointment is pending availability of non-emergent ambulance transport. Advised to continue working with [facility] to determine which transportation is covered. Discussed current symptoms and TPN management. Spouse is comfortable changing TPN bags and completing routine PICC flushing. Thorough discussion regarding s/sx of central line infection and indications for seeking medical attention.  Reports he has been managing TPN well. He has experienced significant weight loss and muscle wasting but spouse feels he is currently stable. Will follow up with the Gastroenterology team regarding plan for evaluation. Discussed need to contact Dr. [name] or his assigned PA if patient require non urgent evaluation in the home.  Reviewed worsening s/sx that require immediate medical attention.  Symptom Management: Administer all medications as prescribed Continue communications with [facility] regarding covered options for non-emergent ambulance  transport Continue administering TPN as prescribed. Follow instructions for PICC flushing  Monitor for s/sx of central line infection and notify provider as recommended Contact provider or care management team with questions and new concerns as needed.   Follow Up Plan:  Will follow up next month                  PLAN: Will follow up with spouse within the next month

## 2022-09-29 NOTE — Telephone Encounter (Signed)
[facility] and made aware. They stated they will reach out to Neurologist.

## 2022-09-30 DIAGNOSIS — J9600 Acute respiratory failure, unspecified whether with hypoxia or hypercapnia: Secondary | ICD-10-CM | POA: Diagnosis not present

## 2022-09-30 DIAGNOSIS — I5022 Chronic systolic (congestive) heart failure: Secondary | ICD-10-CM | POA: Diagnosis not present

## 2022-09-30 DIAGNOSIS — Z452 Encounter for adjustment and management of vascular access device: Secondary | ICD-10-CM | POA: Diagnosis not present

## 2022-09-30 DIAGNOSIS — Z430 Encounter for attention to tracheostomy: Secondary | ICD-10-CM | POA: Diagnosis not present

## 2022-09-30 DIAGNOSIS — J9612 Chronic respiratory failure with hypercapnia: Secondary | ICD-10-CM | POA: Diagnosis not present

## 2022-09-30 DIAGNOSIS — F419 Anxiety disorder, unspecified: Secondary | ICD-10-CM | POA: Diagnosis not present

## 2022-09-30 DIAGNOSIS — N179 Acute kidney failure, unspecified: Secondary | ICD-10-CM | POA: Diagnosis not present

## 2022-09-30 DIAGNOSIS — D649 Anemia, unspecified: Secondary | ICD-10-CM | POA: Diagnosis not present

## 2022-09-30 DIAGNOSIS — E46 Unspecified protein-calorie malnutrition: Secondary | ICD-10-CM | POA: Diagnosis not present

## 2022-09-30 DIAGNOSIS — J189 Pneumonia, unspecified organism: Secondary | ICD-10-CM | POA: Diagnosis not present

## 2022-09-30 DIAGNOSIS — J9611 Chronic respiratory failure with hypoxia: Secondary | ICD-10-CM | POA: Diagnosis not present

## 2022-09-30 DIAGNOSIS — G1221 Amyotrophic lateral sclerosis: Secondary | ICD-10-CM | POA: Diagnosis not present

## 2022-09-30 DIAGNOSIS — I4891 Unspecified atrial fibrillation: Secondary | ICD-10-CM | POA: Diagnosis not present

## 2022-09-30 DIAGNOSIS — G8250 Quadriplegia, unspecified: Secondary | ICD-10-CM | POA: Diagnosis not present

## 2022-10-01 DIAGNOSIS — I5022 Chronic systolic (congestive) heart failure: Secondary | ICD-10-CM | POA: Diagnosis not present

## 2022-10-01 DIAGNOSIS — I4891 Unspecified atrial fibrillation: Secondary | ICD-10-CM | POA: Diagnosis not present

## 2022-10-01 DIAGNOSIS — J189 Pneumonia, unspecified organism: Secondary | ICD-10-CM | POA: Diagnosis not present

## 2022-10-01 DIAGNOSIS — J9600 Acute respiratory failure, unspecified whether with hypoxia or hypercapnia: Secondary | ICD-10-CM | POA: Diagnosis not present

## 2022-10-01 DIAGNOSIS — N179 Acute kidney failure, unspecified: Secondary | ICD-10-CM | POA: Diagnosis not present

## 2022-10-01 NOTE — Telephone Encounter (Signed)
complete

## 2022-10-02 DIAGNOSIS — N179 Acute kidney failure, unspecified: Secondary | ICD-10-CM | POA: Diagnosis not present

## 2022-10-02 DIAGNOSIS — G1221 Amyotrophic lateral sclerosis: Secondary | ICD-10-CM | POA: Diagnosis not present

## 2022-10-02 DIAGNOSIS — J189 Pneumonia, unspecified organism: Secondary | ICD-10-CM | POA: Diagnosis not present

## 2022-10-02 DIAGNOSIS — J9621 Acute and chronic respiratory failure with hypoxia: Secondary | ICD-10-CM | POA: Diagnosis not present

## 2022-10-02 DIAGNOSIS — R140 Abdominal distension (gaseous): Secondary | ICD-10-CM | POA: Diagnosis not present

## 2022-10-02 DIAGNOSIS — I5022 Chronic systolic (congestive) heart failure: Secondary | ICD-10-CM | POA: Diagnosis not present

## 2022-10-02 DIAGNOSIS — J9600 Acute respiratory failure, unspecified whether with hypoxia or hypercapnia: Secondary | ICD-10-CM | POA: Diagnosis not present

## 2022-10-02 DIAGNOSIS — K567 Ileus, unspecified: Secondary | ICD-10-CM | POA: Diagnosis not present

## 2022-10-02 DIAGNOSIS — I4891 Unspecified atrial fibrillation: Secondary | ICD-10-CM | POA: Diagnosis not present

## 2022-10-03 DIAGNOSIS — J9600 Acute respiratory failure, unspecified whether with hypoxia or hypercapnia: Secondary | ICD-10-CM | POA: Diagnosis not present

## 2022-10-03 DIAGNOSIS — J189 Pneumonia, unspecified organism: Secondary | ICD-10-CM | POA: Diagnosis not present

## 2022-10-03 DIAGNOSIS — N179 Acute kidney failure, unspecified: Secondary | ICD-10-CM | POA: Diagnosis not present

## 2022-10-03 DIAGNOSIS — I5022 Chronic systolic (congestive) heart failure: Secondary | ICD-10-CM | POA: Diagnosis not present

## 2022-10-03 DIAGNOSIS — I4891 Unspecified atrial fibrillation: Secondary | ICD-10-CM | POA: Diagnosis not present

## 2022-10-04 ENCOUNTER — Ambulatory Visit: Payer: Self-pay

## 2022-10-04 DIAGNOSIS — J189 Pneumonia, unspecified organism: Secondary | ICD-10-CM | POA: Diagnosis not present

## 2022-10-04 DIAGNOSIS — I5022 Chronic systolic (congestive) heart failure: Secondary | ICD-10-CM | POA: Diagnosis not present

## 2022-10-04 DIAGNOSIS — N179 Acute kidney failure, unspecified: Secondary | ICD-10-CM | POA: Diagnosis not present

## 2022-10-04 DIAGNOSIS — J9600 Acute respiratory failure, unspecified whether with hypoxia or hypercapnia: Secondary | ICD-10-CM | POA: Diagnosis not present

## 2022-10-04 DIAGNOSIS — I4891 Unspecified atrial fibrillation: Secondary | ICD-10-CM | POA: Diagnosis not present

## 2022-10-04 NOTE — Chronic Care Management (AMB) (Signed)
  Chronic Care Management   CCM RN Visit Note  10/04/2022 Name: Jerry [name] MRN: [id number] DOB: 26-Feb-1971  Subjective: Jerry [name] is a 52 y.o. year old male who is a primary care patient of [name], MD. The patient was referred to the Chronic Care Management team for assistance with care management needs subsequent to provider initiation of CCM services and plan of care.    Today's Visit:  Call from [facility].

## 2022-10-05 DIAGNOSIS — N179 Acute kidney failure, unspecified: Secondary | ICD-10-CM | POA: Diagnosis not present

## 2022-10-05 DIAGNOSIS — I5022 Chronic systolic (congestive) heart failure: Secondary | ICD-10-CM | POA: Diagnosis not present

## 2022-10-05 DIAGNOSIS — K56609 Unspecified intestinal obstruction, unspecified as to partial versus complete obstruction: Secondary | ICD-10-CM | POA: Diagnosis not present

## 2022-10-05 DIAGNOSIS — G1221 Amyotrophic lateral sclerosis: Secondary | ICD-10-CM | POA: Diagnosis not present

## 2022-10-05 DIAGNOSIS — R627 Adult failure to thrive: Secondary | ICD-10-CM | POA: Diagnosis not present

## 2022-10-05 DIAGNOSIS — J9690 Respiratory failure, unspecified, unspecified whether with hypoxia or hypercapnia: Secondary | ICD-10-CM | POA: Diagnosis not present

## 2022-10-05 DIAGNOSIS — J189 Pneumonia, unspecified organism: Secondary | ICD-10-CM | POA: Diagnosis not present

## 2022-10-05 DIAGNOSIS — I4891 Unspecified atrial fibrillation: Secondary | ICD-10-CM | POA: Diagnosis not present

## 2022-10-05 DIAGNOSIS — M6281 Muscle weakness (generalized): Secondary | ICD-10-CM | POA: Diagnosis not present

## 2022-10-05 DIAGNOSIS — J9600 Acute respiratory failure, unspecified whether with hypoxia or hypercapnia: Secondary | ICD-10-CM | POA: Diagnosis not present

## 2022-10-08 DIAGNOSIS — Z452 Encounter for adjustment and management of vascular access device: Secondary | ICD-10-CM | POA: Diagnosis not present

## 2022-10-08 LAB — HEPATIC FUNCTION PANEL
ALT: 84 U/L — AB (ref 10–40)
AST: 35 (ref 14–40)
Alkaline Phosphatase: 192 — AB (ref 25–125)
Bilirubin, Total: 1.3

## 2022-10-08 LAB — BASIC METABOLIC PANEL
CO2: 23 — AB (ref 13–22)
Chloride: 91 — AB (ref 99–108)
Glucose: 130
Potassium: 3.2 mEq/L — AB (ref 3.5–5.1)

## 2022-10-08 LAB — CBC AND DIFFERENTIAL
HCT: 33 — AB (ref 41–53)
Hemoglobin: 11.5 — AB (ref 13.5–17.5)
Platelets: 124 10*3/uL — AB (ref 150–400)
WBC: 6

## 2022-10-08 LAB — COMPREHENSIVE METABOLIC PANEL
Albumin: 4.1 (ref 3.5–5.0)
Calcium: 9.9 (ref 8.7–10.7)
Globulin: 3.8

## 2022-10-08 LAB — CBC: RBC: 4.07 (ref 3.87–5.11)

## 2022-10-09 DIAGNOSIS — K567 Ileus, unspecified: Secondary | ICD-10-CM | POA: Diagnosis not present

## 2022-10-09 DIAGNOSIS — J9621 Acute and chronic respiratory failure with hypoxia: Secondary | ICD-10-CM | POA: Diagnosis not present

## 2022-10-09 DIAGNOSIS — R140 Abdominal distension (gaseous): Secondary | ICD-10-CM | POA: Diagnosis not present

## 2022-10-09 DIAGNOSIS — G1221 Amyotrophic lateral sclerosis: Secondary | ICD-10-CM | POA: Diagnosis not present

## 2022-10-13 ENCOUNTER — Encounter: Payer: Self-pay | Admitting: Family Medicine

## 2022-10-13 ENCOUNTER — Telehealth: Payer: Self-pay | Admitting: Family Medicine

## 2022-10-13 NOTE — Telephone Encounter (Unsigned)
Copied from CRM [id number]. Topic: Quick Communication - Home Health Verbal Orders >> Oct 13, 2022  2:32 PM Ja-Kwan M wrote: Caller/Agency: Kathrynn Speed with [facility] Callback Number: [phone] Requesting OT/PT/Skilled Nursing/Social Work/Speech Therapy: continue skilled private duty nursing  Frequency: 45 hours per week and on 10/19/22 will be increasing hours to 69 per week

## 2022-10-14 NOTE — Telephone Encounter (Signed)
Verbal orders given  

## 2022-10-15 LAB — BASIC METABOLIC PANEL: Chloride: 93 — AB (ref 99–108)

## 2022-10-16 DIAGNOSIS — J9621 Acute and chronic respiratory failure with hypoxia: Secondary | ICD-10-CM | POA: Diagnosis not present

## 2022-10-16 DIAGNOSIS — G1221 Amyotrophic lateral sclerosis: Secondary | ICD-10-CM | POA: Diagnosis not present

## 2022-10-16 DIAGNOSIS — R140 Abdominal distension (gaseous): Secondary | ICD-10-CM | POA: Diagnosis not present

## 2022-10-16 DIAGNOSIS — K567 Ileus, unspecified: Secondary | ICD-10-CM | POA: Diagnosis not present

## 2022-10-18 ENCOUNTER — Other Ambulatory Visit: Payer: Self-pay | Admitting: Family Medicine

## 2022-10-18 DIAGNOSIS — G1221 Amyotrophic lateral sclerosis: Secondary | ICD-10-CM

## 2022-10-21 ENCOUNTER — Ambulatory Visit: Payer: Medicare HMO

## 2022-10-21 NOTE — Chronic Care Management (AMB) (Signed)
  Chronic Care Management   CCM RN Visit Note  10/21/2022 Name: Jerry [name] MRN: [id number] DOB: 11-16-1970  Subjective: Jerry [name] is a 52 y.o. year old male who is a primary care patient of [name], MD. The patient was referred to the Chronic Care Management team for assistance with care management needs subsequent to provider initiation of CCM services and plan of care.    Today's Visit:  Engaged with patient's spouse/caregiver by telephone for follow up visit.

## 2022-10-22 DIAGNOSIS — E063 Autoimmune thyroiditis: Secondary | ICD-10-CM | POA: Diagnosis not present

## 2022-10-23 DIAGNOSIS — G1221 Amyotrophic lateral sclerosis: Secondary | ICD-10-CM | POA: Diagnosis not present

## 2022-10-23 DIAGNOSIS — J9621 Acute and chronic respiratory failure with hypoxia: Secondary | ICD-10-CM | POA: Diagnosis not present

## 2022-10-23 DIAGNOSIS — K567 Ileus, unspecified: Secondary | ICD-10-CM | POA: Diagnosis not present

## 2022-10-23 DIAGNOSIS — R140 Abdominal distension (gaseous): Secondary | ICD-10-CM | POA: Diagnosis not present

## 2022-10-25 NOTE — Progress Notes (Deleted)
Name: Jerry [name]   MRN: [id number]    DOB: 1970-07-17   Date:10/25/2022       Progress Note  Subjective  Chief Complaint  Follow Up  HPI  ALS: wife did most of the talking, but patient smiled and answered some of the question with his I-gaze device. He was admitted to [facility] last Fall developed ileus, had to get a tracheostomy, he was discharged to a long term care facility and his plan could not extend his stay, therefore he was sent home with one month supply of his medications with tracheostomy, vent , TPN and only a few orders. They currently have a nurse that comes to assist and are getting TPN that wife has to manage at home, pharmacist is ordering labs and reviewing it ( I cannot see labs done after his discharge) He is happy to be home, but wife has been feeling overwhelmed. She states when the trach gets occluded they don't have a 24 hour access for help, they tried to bring him here for in person visit but transportation was costly and did not have any medical personal in the [place]. She did some research and called his insurance, we will try to get a home health agency that offers respiratory therapist, he is having rom exercises with a nurse aid but wife also would like to see if approved for in home PT. Due to ileus and possibility to resume oral nutrition we will refer him back to GI, he is also due for pulmonologist evaluation   Malnutrition: significant weight loss and drop of albumin, on TPN, he has visible muscle waisting He has a PEG tube but is only used for oral medications at this time  Oral secretions: he needs refill of Robinul to take prn and also transdermal patch that was given while at long term care facility   Chronic respiratory failure with hypoxic: he is dependent of trach and ventilator , needs respiratory therapist to assist his wife .   Patient Active Problem List   Diagnosis Date Noted   Chronic respiratory failure with hypoxia 06/22/2022   Ileus 05/19/2022    Autonomic dysfunction 01/06/2022   Ventilator dependent 01/06/2022   Tracheostomy dependent 01/06/2022   Pressure injury of skin 12/27/2021   Anxiety 11/18/2021   Bed sore on buttock, right, unstageable 11/18/2021   Severe protein-calorie malnutrition 11/13/2021   Microscopic hematuria 10/29/2021   S/P percutaneous endoscopic gastrostomy (PEG) tube placement 10/29/2021   ALS (amyotrophic lateral sclerosis) 01/11/2019    Past Surgical History:  Procedure Laterality Date   IR REPLACE G-TUBE SIMPLE WO FLUORO  06/22/2022   MASS EXCISION Right 11/26/2014   Procedure: EXCISION MASS/GROIN EXCISION MASS;  Surgeon: Earline Mayotte, MD;  Location: [facility] ORS;  Service: General;  Laterality: Right;   MASS EXCISION Right 11/26/2014   Procedure: MINOR EXCISION OF MASS/POST THIGH MASS;  Surgeon: Earline Mayotte, MD;  Location: [facility] ORS;  Service: General;  Laterality: Right;   PEG PLACEMENT  10/27/2021   PORTACATH PLACEMENT Right 06/19/2019    Family History  Problem Relation Age of Onset   Diabetes Father    Diabetes Mother    Diabetes Sister    Hypertension Sister    Diabetes Brother    Diabetes Sister    Fibromyalgia Sister    Diabetes Sister     Social History   Tobacco Use   Smoking status: Never   Smokeless tobacco: Never  Substance Use Topics   Alcohol use: No  Alcohol/week: 0.0 standard drinks of alcohol     Current Outpatient Medications:    glycopyrrolate (ROBINUL) 1 MG tablet, Place 1 tablet (1 mg total) into feeding tube 2 (two) times daily as needed., Disp: 60 tablet, Rfl: 5   ipratropium-albuterol (DUONEB) 0.5-2.5 (3) MG/3ML SOLN, USE 3 ML VIA NEBULIZER EVERY 6 HOURS AS NEEDED FOR SHORTNESS OF BREATH, Disp: 360 mL, Rfl: 0   levofloxacin (LEVAQUIN) 750 MG tablet, Place 1 tablet (750 mg total) into feeding tube daily. Crush for tube feeds., Disp: 7 tablet, Rfl: 0   polyethylene glycol powder (GLYCOLAX/MIRALAX) 17 GM/SCOOP powder, Place 17 g into feeding tube daily.  Titrate as needed for constipation, Disp: 255 g, Rfl: 5   PRESCRIPTION MEDICATION, Home TPN (Patient not taking: Reported on 07/22/2022), Disp: , Rfl:    scopolamine (TRANSDERM-SCOP) 1 MG/3DAYS, Place 1 patch (1.5 mg total) onto the skin every 3 (three) days., Disp: 10 patch, Rfl: 5  Allergies  Allergen Reactions   Crab [Shellfish Allergy] Rash    I personally reviewed active problem list, medication list, allergies, family history, social history, health maintenance with the patient/caregiver today.   ROS  ***  Objective  There were no vitals filed for this visit.  There is no height or weight on file to calculate BMI.  Physical Exam ***   PHQ2/9:    09/29/2022    5:45 PM 07/22/2022    9:46 AM 11/13/2021    9:03 AM 10/03/2020   10:22 AM 09/04/2020    2:08 PM  Depression screen PHQ 2/9  Decreased Interest 0 0 0 0 0  Down, Depressed, Hopeless 0 0 0 0 0  PHQ - 2 Score 0 0 0 0 0    phq 9 is {gen pos [id number]}   Fall Risk:    09/29/2022    5:48 PM 07/22/2022    9:43 AM 11/18/2021   11:22 AM 11/13/2021    9:03 AM 10/03/2020   10:22 AM  Fall Risk   Falls in the past year? Exclusion - non ambulatory Exclusion - non ambulatory 0 0 0  Number falls in past yr:    0 0  Injury with Fall?    0 0  Risk for fall due to :  Impaired mobility;Impaired balance/gait Impaired mobility No Fall Risks Impaired balance/gait;Impaired mobility  Follow up  Falls prevention discussed Falls evaluation completed;Education provided Falls prevention discussed Falls prevention discussed      Functional Status Survey:      Assessment & Plan  *** There are no diagnoses linked to this encounter.

## 2022-10-26 ENCOUNTER — Ambulatory Visit: Payer: Medicare HMO | Admitting: Family Medicine

## 2022-10-27 ENCOUNTER — Encounter: Payer: Self-pay | Admitting: Family Medicine

## 2022-10-29 DIAGNOSIS — E063 Autoimmune thyroiditis: Secondary | ICD-10-CM | POA: Diagnosis not present

## 2022-10-29 DIAGNOSIS — E876 Hypokalemia: Secondary | ICD-10-CM | POA: Diagnosis not present

## 2022-10-29 DIAGNOSIS — E46 Unspecified protein-calorie malnutrition: Secondary | ICD-10-CM | POA: Diagnosis not present

## 2022-10-29 DIAGNOSIS — D649 Anemia, unspecified: Secondary | ICD-10-CM | POA: Diagnosis not present

## 2022-10-29 LAB — COMPREHENSIVE METABOLIC PANEL
Albumin: 4 (ref 3.5–5.0)
Calcium: 9.6 (ref 8.7–10.7)
Globulin: 3.3
eGFR: 144

## 2022-10-29 LAB — BASIC METABOLIC PANEL
BUN: 22 — AB (ref 4–21)
CO2: 19 (ref 13–22)
Chloride: 97 — AB (ref 99–108)
Creatinine: 0.3 — AB (ref 0.6–1.3)
Glucose: 90
Potassium: 4 mEq/L (ref 3.5–5.1)
Sodium: 139 (ref 137–147)

## 2022-10-30 ENCOUNTER — Other Ambulatory Visit: Payer: Self-pay | Admitting: Family Medicine

## 2022-10-30 DIAGNOSIS — G1221 Amyotrophic lateral sclerosis: Secondary | ICD-10-CM

## 2022-10-30 DIAGNOSIS — R140 Abdominal distension (gaseous): Secondary | ICD-10-CM | POA: Diagnosis not present

## 2022-10-30 DIAGNOSIS — K567 Ileus, unspecified: Secondary | ICD-10-CM | POA: Diagnosis not present

## 2022-10-30 DIAGNOSIS — J9621 Acute and chronic respiratory failure with hypoxia: Secondary | ICD-10-CM | POA: Diagnosis not present

## 2022-11-03 ENCOUNTER — Encounter: Payer: Self-pay | Admitting: Family Medicine

## 2022-11-04 DIAGNOSIS — K56609 Unspecified intestinal obstruction, unspecified as to partial versus complete obstruction: Secondary | ICD-10-CM | POA: Diagnosis not present

## 2022-11-04 DIAGNOSIS — G1221 Amyotrophic lateral sclerosis: Secondary | ICD-10-CM | POA: Diagnosis not present

## 2022-11-04 DIAGNOSIS — J9690 Respiratory failure, unspecified, unspecified whether with hypoxia or hypercapnia: Secondary | ICD-10-CM | POA: Diagnosis not present

## 2022-11-04 DIAGNOSIS — T7849XA Other allergy, initial encounter: Secondary | ICD-10-CM | POA: Diagnosis not present

## 2022-11-04 DIAGNOSIS — M6281 Muscle weakness (generalized): Secondary | ICD-10-CM | POA: Diagnosis not present

## 2022-11-05 DIAGNOSIS — Z452 Encounter for adjustment and management of vascular access device: Secondary | ICD-10-CM | POA: Diagnosis not present

## 2022-11-06 DIAGNOSIS — G1221 Amyotrophic lateral sclerosis: Secondary | ICD-10-CM | POA: Diagnosis not present

## 2022-11-06 DIAGNOSIS — R140 Abdominal distension (gaseous): Secondary | ICD-10-CM | POA: Diagnosis not present

## 2022-11-06 DIAGNOSIS — K567 Ileus, unspecified: Secondary | ICD-10-CM | POA: Diagnosis not present

## 2022-11-06 DIAGNOSIS — J9621 Acute and chronic respiratory failure with hypoxia: Secondary | ICD-10-CM | POA: Diagnosis not present

## 2022-11-12 ENCOUNTER — Encounter: Payer: Self-pay | Admitting: Family Medicine

## 2022-11-12 DIAGNOSIS — E063 Autoimmune thyroiditis: Secondary | ICD-10-CM | POA: Diagnosis not present

## 2022-11-13 DIAGNOSIS — R140 Abdominal distension (gaseous): Secondary | ICD-10-CM | POA: Diagnosis not present

## 2022-11-13 DIAGNOSIS — G1221 Amyotrophic lateral sclerosis: Secondary | ICD-10-CM | POA: Diagnosis not present

## 2022-11-13 DIAGNOSIS — J9621 Acute and chronic respiratory failure with hypoxia: Secondary | ICD-10-CM | POA: Diagnosis not present

## 2022-11-13 DIAGNOSIS — K567 Ileus, unspecified: Secondary | ICD-10-CM | POA: Diagnosis not present

## 2022-11-19 DIAGNOSIS — Z452 Encounter for adjustment and management of vascular access device: Secondary | ICD-10-CM | POA: Diagnosis not present

## 2022-11-20 DIAGNOSIS — J9621 Acute and chronic respiratory failure with hypoxia: Secondary | ICD-10-CM | POA: Diagnosis not present

## 2022-11-20 DIAGNOSIS — K567 Ileus, unspecified: Secondary | ICD-10-CM | POA: Diagnosis not present

## 2022-11-20 DIAGNOSIS — G1221 Amyotrophic lateral sclerosis: Secondary | ICD-10-CM | POA: Diagnosis not present

## 2022-11-20 DIAGNOSIS — R140 Abdominal distension (gaseous): Secondary | ICD-10-CM | POA: Diagnosis not present

## 2022-11-26 DIAGNOSIS — E063 Autoimmune thyroiditis: Secondary | ICD-10-CM | POA: Diagnosis not present

## 2022-11-27 DIAGNOSIS — G1221 Amyotrophic lateral sclerosis: Secondary | ICD-10-CM | POA: Diagnosis not present

## 2022-11-27 DIAGNOSIS — J9621 Acute and chronic respiratory failure with hypoxia: Secondary | ICD-10-CM | POA: Diagnosis not present

## 2022-11-27 DIAGNOSIS — K567 Ileus, unspecified: Secondary | ICD-10-CM | POA: Diagnosis not present

## 2022-11-27 DIAGNOSIS — R140 Abdominal distension (gaseous): Secondary | ICD-10-CM | POA: Diagnosis not present

## 2022-12-03 DIAGNOSIS — Z452 Encounter for adjustment and management of vascular access device: Secondary | ICD-10-CM | POA: Diagnosis not present

## 2022-12-03 DIAGNOSIS — E46 Unspecified protein-calorie malnutrition: Secondary | ICD-10-CM | POA: Diagnosis not present

## 2022-12-03 DIAGNOSIS — G1221 Amyotrophic lateral sclerosis: Secondary | ICD-10-CM | POA: Diagnosis not present

## 2022-12-04 DIAGNOSIS — G1221 Amyotrophic lateral sclerosis: Secondary | ICD-10-CM | POA: Diagnosis not present

## 2022-12-04 DIAGNOSIS — R140 Abdominal distension (gaseous): Secondary | ICD-10-CM | POA: Diagnosis not present

## 2022-12-04 DIAGNOSIS — K567 Ileus, unspecified: Secondary | ICD-10-CM | POA: Diagnosis not present

## 2022-12-04 DIAGNOSIS — J9621 Acute and chronic respiratory failure with hypoxia: Secondary | ICD-10-CM | POA: Diagnosis not present

## 2022-12-08 ENCOUNTER — Other Ambulatory Visit: Payer: Self-pay | Admitting: Family Medicine

## 2022-12-08 DIAGNOSIS — G1221 Amyotrophic lateral sclerosis: Secondary | ICD-10-CM

## 2022-12-10 DIAGNOSIS — Z452 Encounter for adjustment and management of vascular access device: Secondary | ICD-10-CM | POA: Diagnosis not present

## 2022-12-11 DIAGNOSIS — R140 Abdominal distension (gaseous): Secondary | ICD-10-CM | POA: Diagnosis not present

## 2022-12-11 DIAGNOSIS — G1221 Amyotrophic lateral sclerosis: Secondary | ICD-10-CM | POA: Diagnosis not present

## 2022-12-11 DIAGNOSIS — J9621 Acute and chronic respiratory failure with hypoxia: Secondary | ICD-10-CM | POA: Diagnosis not present

## 2022-12-11 DIAGNOSIS — K567 Ileus, unspecified: Secondary | ICD-10-CM | POA: Diagnosis not present

## 2022-12-15 ENCOUNTER — Encounter: Payer: Self-pay | Admitting: Family Medicine

## 2022-12-17 DIAGNOSIS — G8250 Quadriplegia, unspecified: Secondary | ICD-10-CM | POA: Diagnosis not present

## 2022-12-17 DIAGNOSIS — Z452 Encounter for adjustment and management of vascular access device: Secondary | ICD-10-CM | POA: Diagnosis not present

## 2022-12-17 DIAGNOSIS — Z7401 Bed confinement status: Secondary | ICD-10-CM | POA: Diagnosis not present

## 2022-12-17 DIAGNOSIS — Z931 Gastrostomy status: Secondary | ICD-10-CM | POA: Diagnosis not present

## 2022-12-17 DIAGNOSIS — J9611 Chronic respiratory failure with hypoxia: Secondary | ICD-10-CM | POA: Diagnosis not present

## 2022-12-17 DIAGNOSIS — Z9981 Dependence on supplemental oxygen: Secondary | ICD-10-CM | POA: Diagnosis not present

## 2022-12-17 DIAGNOSIS — G1221 Amyotrophic lateral sclerosis: Secondary | ICD-10-CM | POA: Diagnosis not present

## 2022-12-17 DIAGNOSIS — Z9181 History of falling: Secondary | ICD-10-CM | POA: Diagnosis not present

## 2022-12-17 DIAGNOSIS — Z9911 Dependence on respirator [ventilator] status: Secondary | ICD-10-CM | POA: Diagnosis not present

## 2022-12-17 DIAGNOSIS — Z8701 Personal history of pneumonia (recurrent): Secondary | ICD-10-CM | POA: Diagnosis not present

## 2022-12-17 LAB — COMPREHENSIVE METABOLIC PANEL
Albumin: 3.8 (ref 3.5–5.0)
Calcium: 9.4 (ref 8.7–10.7)
Globulin: 3.1
eGFR: 148

## 2022-12-17 LAB — CBC AND DIFFERENTIAL
HCT: 31 — AB (ref 41–53)
Hemoglobin: 10.5 — AB (ref 13.5–17.5)
Neutrophils Absolute: 4
Platelets: 133 10*3/uL — AB (ref 150–400)
WBC: 6.1

## 2022-12-17 LAB — BASIC METABOLIC PANEL
BUN: 19 (ref 4–21)
CO2: 21 (ref 13–22)
Chloride: 104 (ref 99–108)
Creatinine: 0.3 — AB (ref 0.6–1.3)
Glucose: 148
Potassium: 4 mEq/L (ref 3.5–5.1)
Sodium: 140 (ref 137–147)

## 2022-12-17 LAB — HEPATIC FUNCTION PANEL
ALT: 79 U/L — AB (ref 10–40)
AST: 35 (ref 14–40)
Alkaline Phosphatase: 176 — AB (ref 25–125)
Bilirubin, Total: 0.8

## 2022-12-17 LAB — CBC: RBC: 3.74 — AB (ref 3.87–5.11)

## 2022-12-18 DIAGNOSIS — J9621 Acute and chronic respiratory failure with hypoxia: Secondary | ICD-10-CM | POA: Diagnosis not present

## 2022-12-18 DIAGNOSIS — G1221 Amyotrophic lateral sclerosis: Secondary | ICD-10-CM | POA: Diagnosis not present

## 2022-12-18 DIAGNOSIS — K567 Ileus, unspecified: Secondary | ICD-10-CM | POA: Diagnosis not present

## 2022-12-18 DIAGNOSIS — R140 Abdominal distension (gaseous): Secondary | ICD-10-CM | POA: Diagnosis not present

## 2022-12-21 ENCOUNTER — Encounter: Payer: Self-pay | Admitting: Family Medicine

## 2022-12-22 ENCOUNTER — Emergency Department (HOSPITAL_COMMUNITY): Payer: Medicare HMO

## 2022-12-22 ENCOUNTER — Emergency Department (HOSPITAL_COMMUNITY)
Admission: EM | Admit: 2022-12-22 | Discharge: 2022-12-22 | Disposition: A | Discharge status: Home / Self Care | Payer: Medicare HMO | Attending: Emergency Medicine | Admitting: Emergency Medicine

## 2022-12-22 ENCOUNTER — Other Ambulatory Visit: Payer: Self-pay

## 2022-12-22 ENCOUNTER — Encounter (HOSPITAL_COMMUNITY): Payer: Self-pay

## 2022-12-22 DIAGNOSIS — R609 Edema, unspecified: Secondary | ICD-10-CM | POA: Diagnosis not present

## 2022-12-22 DIAGNOSIS — Z95828 Presence of other vascular implants and grafts: Secondary | ICD-10-CM | POA: Insufficient documentation

## 2022-12-22 DIAGNOSIS — R9431 Abnormal electrocardiogram [ECG] [EKG]: Secondary | ICD-10-CM | POA: Diagnosis not present

## 2022-12-22 DIAGNOSIS — Z452 Encounter for adjustment and management of vascular access device: Secondary | ICD-10-CM | POA: Diagnosis not present

## 2022-12-22 DIAGNOSIS — L299 Pruritus, unspecified: Secondary | ICD-10-CM | POA: Diagnosis not present

## 2022-12-22 LAB — CBC
HCT: 35.2 % — ABNORMAL LOW (ref 39.0–52.0)
Hemoglobin: 11.5 g/dL — ABNORMAL LOW (ref 13.0–17.0)
MCH: 27.5 pg (ref 26.0–34.0)
MCHC: 32.7 g/dL (ref 30.0–36.0)
MCV: 84.2 fL (ref 80.0–100.0)
Platelets: 159 10*3/uL (ref 150–400)
RBC: 4.18 MIL/uL — ABNORMAL LOW (ref 4.22–5.81)
RDW: 15.3 % (ref 11.5–15.5)
WBC: 6.7 10*3/uL (ref 4.0–10.5)
nRBC: 0 % (ref 0.0–0.2)

## 2022-12-22 LAB — BASIC METABOLIC PANEL
Anion gap: 10 (ref 5–15)
BUN: 18 mg/dL (ref 6–20)
CO2: 21 mmol/L — ABNORMAL LOW (ref 22–32)
Calcium: 9.5 mg/dL (ref 8.9–10.3)
Chloride: 106 mmol/L (ref 98–111)
Creatinine, Ser: <0.3 mg/dL — ABNORMAL LOW (ref 0.61–1.24)
Glucose, Bld: 88 mg/dL (ref 70–99)
Potassium: 3.2 mmol/L — ABNORMAL LOW (ref 3.5–5.1)
Sodium: 137 mmol/L (ref 135–145)

## 2022-12-22 MED ORDER — NOREPINEPHRINE 4 MG/250ML-% IV SOLN: 2.0000 ug/min | INTRAVENOUS | Status: DC

## 2022-12-22 MED ORDER — SODIUM CHLORIDE 0.9 % IV SOLN
250.0000 mL | INTRAVENOUS | Status: DC
  Administered 2022-12-22: 250 mL via INTRAVENOUS

## 2022-12-22 NOTE — ED Notes (Signed)
Patient sent home via PTAR spouse road along to manage vent

## 2022-12-22 NOTE — ED Triage Notes (Signed)
Brought in for evaluation of possible picc line and gtube infections

## 2022-12-22 NOTE — ED Provider Notes (Signed)
[place] EMERGENCY DEPARTMENT AT [facility] Provider Note   CSN: [id number] Arrival date & time: 12/22/22  1927     History  Chief Complaint  Patient presents with   Blood Infection    Pt arrived via ems from home with picc line in left bicep red itchy draining pus also black  possible mold in gtube has trach on vent also has condom cath in place    Jerry [name] is a 52 y.o. male.  Mr. Moraski is a 52 year old male with past medical history of ALS that has required a trach, G-Tube, and PICC line. He was brought in by his wife concerning for a PICC line infection. She has noticed some "crustiness" around the area, but no redness or swelling. She feels like the PICC line is in too deep, as the nurses who work with the patient have told her. Denies any fevers, shortness of breath, or pain. The last time the PICC line was replaced as in December of 2023, however he does get weekly dressing changes.         Home Medications Prior to Admission medications   Medication Sig Start Date End Date Taking? Authorizing Provider  glycopyrrolate (ROBINUL) 1 MG tablet Place 1 tablet (1 mg total) into feeding tube 2 (two) times daily as needed. 07/22/22   Sowles, Danna Hefty, MD  ipratropium-albuterol (DUONEB) 0.5-2.5 (3) MG/3ML SOLN USE 3 ML VIA NEBULIZER EVERY 6 HOURS AS NEEDED FOR SHORTNESS OF BREATH 12/09/22   Carlynn Purl, Danna Hefty, MD  levofloxacin (LEVAQUIN) 750 MG tablet Place 1 tablet (750 mg total) into feeding tube daily. Crush for tube feeds. 08/12/22   Eber Hong, MD  polyethylene glycol powder (GLYCOLAX/MIRALAX) 17 GM/SCOOP powder Place 17 g into feeding tube daily. Titrate as needed for constipation 07/22/22   [name], MD  PRESCRIPTION MEDICATION Home TPN Patient not taking: Reported on 07/22/2022    [provider]  scopolamine (TRANSDERM-SCOP) 1 MG/3DAYS Place 1 patch (1.5 mg total) onto the skin every 3 (three) days. 07/22/22   [name], MD      Allergies     Parke Simmers allergy]    Review of Systems   Review of Systems  All other systems reviewed and are negative.   Physical Exam Updated Vital Signs BP 113/77   Pulse 78   Temp 98.3 F (36.8 C) (Oral)   Resp 16   Ht 5\' 5"  (1.651 m)   Wt 65 kg   SpO2 100%   BMI 23.85 kg/m  Physical Exam Constitutional:      Appearance: He is ill-appearing.     Comments: Trach in place  HENT:     Head: Normocephalic and atraumatic.  Eyes:     Pupils: Pupils are equal, round, and reactive to light.  Cardiovascular:     Rate and Rhythm: Normal rate and regular rhythm.  Abdominal:     Comments: No erythema present around G-Tube  Skin:    Comments: No erythema present around PICC line. Sticky, brown,glue-like crustiness able to be pulled off     ED Results / Procedures / Treatments   Labs (all labs ordered are listed, but only abnormal results are displayed) Labs Reviewed  CBC - Abnormal; Notable for the following components:      Result Value   RBC 4.18 (*)    Hemoglobin 11.5 (*)    HCT 35.2 (*)    All other components within normal limits  BASIC METABOLIC PANEL - Abnormal; Notable for  the following components:   Potassium 3.2 (*)    CO2 21 (*)    Creatinine, Ser <0.30 (*)    All other components within normal limits    EKG EKG Interpretation  Date/Time:  Wednesday December 22 2022 19:35:42 EDT Ventricular Rate:  75 PR Interval:  136 QRS Duration: 95 QT Interval:  380 QTC Calculation: 425 R Axis:   29 Text Interpretation: Sinus rhythm No significant change since last tracing Confirmed by Jacalyn Lefevre [phone]) on 12/22/2022 8:03:35 PM  Radiology DG Chest Portable 1 View  Result Date: 12/22/2022 CLINICAL DATA:  PICC placement. EXAM: PORTABLE CHEST 1 VIEW COMPARISON:  Chest radiograph dated 08/12/2022. FINDINGS: Tracheostomy above the carina. Right-sided Port-A-Cath with tip in the region of the cavoatrial junction. Left-sided PICC with tip over central SVC. No focal  consolidation, pleural effusion, or pneumothorax. The cardiac silhouette is within normal limits. No acute osseous pathology. IMPRESSION: Left-sided PICC with tip over central SVC. No acute cardiopulmonary process. Electronically Signed   By: Elgie Collard M.D.   On: 12/22/2022 20:51    Procedures Procedures    Medications Ordered in ED Medications  0.9 %  sodium chloride infusion (0 mLs Intravenous Stopped 12/22/22 2220)    ED Course/ Medical Decision Making/ A&P                             Medical Decision Making 52 year old male with G-Tube and PICC line in place due to ALS presents via EMS for concern of infection of Picc line. On my examination, did not notice any tenderness or erythema around the area. Reassuring that there is no signs of systemic infection such as fevers, chills, nausea, or vomiting. Vitals also within normal limits. Will double check with CBC and BMP, however suspicion for infection is low. Pt also complaining that Picc line may be in too far, PICC line team consulted and so will obtain chest x-ray to further evaluate, and assess positioning. Chest X-Ray shows accurate positioning of PICC line. Provided reassurance that line was placed correctly, and labs reassuring with no signs of infection. Informed family of return rpecautions such as erythema around th eskin or systemic symptoms such as fevers, chills.   Amount and/or Complexity of Data Reviewed Labs: ordered. Radiology: ordered.  Risk Prescription drug management.    Final Clinical Impression(s) / ED Diagnoses Final diagnoses:  Status post peripherally inserted central catheter (PICC) central line placement    Rx / DC Orders ED Discharge Orders     None         Ronnell Clinger, Jason Fila, MD 12/22/22 4098    Jacalyn Lefevre, MD 12/22/22 2351

## 2022-12-22 NOTE — ED Notes (Signed)
[facility] called for stat transfer home as pt is in need of TPN

## 2022-12-22 NOTE — Discharge Instructions (Addendum)
It was a pleasure meeting you. I do not believe your line's are infected, which is a great thing. Signs of infection are redness around the site, or the line stops working, or if you start to develop fevers or chills. The line is positioned correctly as well, and your labs are normal.

## 2022-12-22 NOTE — ED Notes (Signed)
Assumed care of patient who arrived via ems from home on vent with trach with gtube with foley with picc line in left bicep. Patient non verbal uses machine to express self and facial expressions. Spouse and primary caregiver at bedside reports he was sent to evaluate picc and gtube for possible infections. IV team consult initiated . Patient a/o x 4 hx of ALS respirations even and non labored vs wnl will continue to watch and treat as directed

## 2022-12-22 NOTE — Progress Notes (Signed)
PICC tip placement verified, ok to use, dressing changed, lines flushed w/ GBR. Pt. Is to received his TPN tonight before midnight once discharged to home. Flushed w/ saline.

## 2022-12-25 DIAGNOSIS — R140 Abdominal distension (gaseous): Secondary | ICD-10-CM | POA: Diagnosis not present

## 2022-12-25 DIAGNOSIS — G1221 Amyotrophic lateral sclerosis: Secondary | ICD-10-CM | POA: Diagnosis not present

## 2022-12-25 DIAGNOSIS — J9621 Acute and chronic respiratory failure with hypoxia: Secondary | ICD-10-CM | POA: Diagnosis not present

## 2022-12-25 DIAGNOSIS — K567 Ileus, unspecified: Secondary | ICD-10-CM | POA: Diagnosis not present

## 2022-12-28 ENCOUNTER — Telehealth: Payer: Self-pay

## 2022-12-28 NOTE — Telephone Encounter (Signed)
Transition Care Management Unsuccessful Follow-up Telephone Call  Date of discharge and from where:  [facility] 6/19  Attempts:  1st Attempt  Reason for unsuccessful TCM follow-up call:  Left voice message   Lenard Forth [facility] Guide, [facility] [phone] [address] [place], [place], [place] [address] Phone: [phone] Email: [email or web address]

## 2022-12-29 ENCOUNTER — Telehealth: Payer: Self-pay

## 2022-12-29 ENCOUNTER — Telehealth: Payer: Self-pay | Admitting: *Deleted

## 2022-12-29 DIAGNOSIS — Z452 Encounter for adjustment and management of vascular access device: Secondary | ICD-10-CM | POA: Diagnosis not present

## 2022-12-29 NOTE — Telephone Encounter (Signed)
Transition Care Management Unsuccessful Follow-up Telephone Call  Date of discharge and from where:  [facility] 6/18  Attempts:  2nd Attempt  Reason for unsuccessful TCM follow-up call:  Unable to leave message   Lenard Forth [facility] Guide, [facility] [phone] [address] [place], [place], [place] [address] Phone: [phone] Email: [email or web address]

## 2022-12-29 NOTE — Telephone Encounter (Signed)
Left voicemail for Daryll Brod, RM at [facility]. Dr. Carlynn Purl would like to know if culture labs were obtained at drainage site as well as information on where the PICC line was placed in case a new one will be warranted. Thanks!

## 2022-12-29 NOTE — Telephone Encounter (Signed)
Called and left voicemail for Mortimer Fries, RN to return call to answer questions from Dr. Carlynn Purl.

## 2022-12-29 NOTE — Telephone Encounter (Signed)
Calling report: Mr Chilton Si was seen at ED- drainage from [facility] line site last week. He was sent home- normal findings. Patient continues to have drainage- yellowish color, no fever, change in arm circumference. She wanted to make office aware and will call [facility] home infusion also. Labs are currently being processed.

## 2022-12-30 LAB — COMPREHENSIVE METABOLIC PANEL: eGFR: 150

## 2022-12-30 LAB — HEPATIC FUNCTION PANEL
ALT: 69 U/L — AB (ref 10–40)
AST: 33 (ref 14–40)
Alkaline Phosphatase: 178 — AB (ref 25–125)
Bilirubin, Total: 0.8

## 2022-12-30 LAB — CBC AND DIFFERENTIAL
HCT: 32 — AB (ref 41–53)
Hemoglobin: 10.7 — AB (ref 13.5–17.5)
Platelets: 146 10*3/uL — AB (ref 150–400)
WBC: 4.7

## 2022-12-30 LAB — BASIC METABOLIC PANEL
BUN: 19 (ref 4–21)
Creatinine: 0.3 — AB (ref 0.6–1.3)
Glucose: 130

## 2022-12-30 LAB — CBC: RBC: 3.8 — AB (ref 3.87–5.11)

## 2022-12-31 ENCOUNTER — Other Ambulatory Visit: Payer: Self-pay

## 2022-12-31 DIAGNOSIS — G1221 Amyotrophic lateral sclerosis: Secondary | ICD-10-CM

## 2022-12-31 DIAGNOSIS — Z9911 Dependence on respirator [ventilator] status: Secondary | ICD-10-CM

## 2022-12-31 DIAGNOSIS — Z930 Tracheostomy status: Secondary | ICD-10-CM

## 2022-12-31 DIAGNOSIS — E43 Unspecified severe protein-calorie malnutrition: Secondary | ICD-10-CM

## 2023-01-01 DIAGNOSIS — R140 Abdominal distension (gaseous): Secondary | ICD-10-CM | POA: Diagnosis not present

## 2023-01-01 DIAGNOSIS — J9690 Respiratory failure, unspecified, unspecified whether with hypoxia or hypercapnia: Secondary | ICD-10-CM | POA: Diagnosis not present

## 2023-01-01 DIAGNOSIS — K567 Ileus, unspecified: Secondary | ICD-10-CM | POA: Diagnosis not present

## 2023-01-01 DIAGNOSIS — G1221 Amyotrophic lateral sclerosis: Secondary | ICD-10-CM | POA: Diagnosis not present

## 2023-01-01 DIAGNOSIS — J9621 Acute and chronic respiratory failure with hypoxia: Secondary | ICD-10-CM | POA: Diagnosis not present

## 2023-01-01 DIAGNOSIS — E119 Type 2 diabetes mellitus without complications: Secondary | ICD-10-CM | POA: Diagnosis not present

## 2023-01-02 ENCOUNTER — Other Ambulatory Visit: Payer: Self-pay | Admitting: Family Medicine

## 2023-01-02 DIAGNOSIS — G1221 Amyotrophic lateral sclerosis: Secondary | ICD-10-CM

## 2023-01-03 ENCOUNTER — Encounter: Payer: Self-pay | Admitting: Family Medicine

## 2023-01-03 NOTE — Telephone Encounter (Signed)
Completed.

## 2023-01-04 ENCOUNTER — Ambulatory Visit: Payer: Self-pay

## 2023-01-07 LAB — CBC AND DIFFERENTIAL
HCT: 32 — AB (ref 41–53)
Hemoglobin: 10.6 — AB (ref 13.5–17.5)
Neutrophils Absolute: 2.8
Platelets: 136 10*3/uL — AB (ref 150–400)
WBC: 4.7

## 2023-01-07 LAB — HEPATIC FUNCTION PANEL
ALT: 84 U/L — AB (ref 10–40)
AST: 41 — AB (ref 14–40)
Alkaline Phosphatase: 175 — AB (ref 25–125)
Bilirubin, Total: 0.9

## 2023-01-07 LAB — BASIC METABOLIC PANEL
BUN: 17 (ref 4–21)
CO2: 20 (ref 13–22)
Chloride: 105 (ref 99–108)
Creatinine: 0.3 — AB (ref 0.6–1.3)
Glucose: 120
Potassium: 3.8 mEq/L (ref 3.5–5.1)
Sodium: 140 (ref 137–147)

## 2023-01-07 LAB — COMPREHENSIVE METABOLIC PANEL
Albumin: 4.1 (ref 3.5–5.0)
Calcium: 9.2 (ref 8.7–10.7)
Globulin: 3.1
eGFR: 152

## 2023-01-07 LAB — CBC: RBC: 3.8 — AB (ref 3.87–5.11)

## 2023-01-12 ENCOUNTER — Telehealth: Payer: Medicare HMO

## 2023-01-15 LAB — BASIC METABOLIC PANEL
BUN: 68 — AB (ref 4–21)
CO2: 22 (ref 13–22)
Chloride: 105 (ref 99–108)
Creatinine: 0.3 — AB (ref 0.6–1.3)
Glucose: 135
Potassium: 4 mEq/L (ref 3.5–5.1)
Sodium: 140 (ref 137–147)

## 2023-01-15 LAB — CBC AND DIFFERENTIAL
HCT: 32 — AB (ref 41–53)
Hemoglobin: 10.8 — AB (ref 13.5–17.5)
Platelets: 134 10*3/uL — AB (ref 150–400)
WBC: 4.6

## 2023-01-15 LAB — COMPREHENSIVE METABOLIC PANEL
Albumin: 4.1 (ref 3.5–5.0)
Calcium: 9.5 (ref 8.7–10.7)
Globulin: 3.2
eGFR: 152

## 2023-01-15 LAB — CBC: RBC: 3.9 (ref 3.87–5.11)

## 2023-01-17 ENCOUNTER — Encounter: Payer: Self-pay | Admitting: Family Medicine

## 2023-01-19 ENCOUNTER — Ambulatory Visit: Payer: Medicare HMO

## 2023-01-25 ENCOUNTER — Encounter: Payer: Self-pay | Admitting: Family Medicine

## 2023-01-27 ENCOUNTER — Ambulatory Visit: Payer: Medicare HMO

## 2023-01-27 NOTE — Chronic Care Management (AMB) (Signed)
  Chronic Care Management   CCM RN Visit Note  01/27/2023 Name: Jerry [name] MRN: [id number] DOB: 1971/06/14  Subjective: Jerry [name] is a 52 y.o. year old male who is a primary care patient of [name], MD. The patient was referred to the Chronic Care Management team for assistance with care management needs subsequent to provider initiation of CCM services and plan of care.    Today's Visit:  Engaged with patient's spouse by telephone for follow up visit.

## 2023-02-03 ENCOUNTER — Other Ambulatory Visit: Payer: Self-pay | Admitting: Family Medicine

## 2023-02-03 DIAGNOSIS — G1221 Amyotrophic lateral sclerosis: Secondary | ICD-10-CM

## 2023-02-04 ENCOUNTER — Telehealth: Payer: Medicare HMO

## 2023-02-04 LAB — HEPATIC FUNCTION PANEL
ALT: 78 U/L — AB (ref 10–40)
AST: 37 (ref 14–40)
Alkaline Phosphatase: 198 — AB (ref 25–125)
Bilirubin, Total: 0.8

## 2023-02-04 LAB — CBC AND DIFFERENTIAL
HCT: 33 — AB (ref 41–53)
Hemoglobin: 10.9 — AB (ref 13.5–17.5)
Neutrophils Absolute: 2.9
Platelets: 138 10*3/uL — AB (ref 150–400)
WBC: 4.9

## 2023-02-04 LAB — BASIC METABOLIC PANEL
BUN: 18 (ref 4–21)
CO2: 21 (ref 13–22)
Chloride: 104 (ref 99–108)
Creatinine: 0.3 — AB (ref 0.6–1.3)
Glucose: 125
Potassium: 3.8 mEq/L (ref 3.5–5.1)
Sodium: 140 (ref 137–147)

## 2023-02-04 LAB — COMPREHENSIVE METABOLIC PANEL
Albumin: 4.3 (ref 3.5–5.0)
Globulin: 2.8
eGFR: 150

## 2023-02-04 LAB — CBC: RBC: 4.05 (ref 3.87–5.11)

## 2023-02-04 NOTE — Chronic Care Management (AMB) (Unsigned)
  Chronic Care Management   CCM RN Visit Note   Name: Pratt Bress MRN: [id number] DOB: [birth year]  Subjective: Jerry [name] is a 52 y.o. year old male who is a primary care patient of [name], MD. The patient was referred to the Chronic Care Management team for assistance with care management needs subsequent to provider initiation of CCM services and plan of care.

## 2023-02-09 ENCOUNTER — Encounter: Payer: Self-pay | Admitting: Family Medicine

## 2023-02-09 NOTE — Chronic Care Management (AMB) (Signed)
     Jerry [name] Sep 18, 1970 [id number]

## 2023-02-09 NOTE — Patient Instructions (Signed)
Thank you for allowing the Chronic Care Management team to participate in your care.    Following is a copy of the CCM Program Consent:  CCM service includes personalized support from designated clinical staff supervised by the physician, including individualized plan of care and coordination with other care providers 24/7 contact phone numbers for assistance for urgent and routine care needs. Service will only be billed when office clinical staff spend 20 minutes or more in a month to coordinate care. Only one practitioner may furnish and bill the service in a calendar month. The patient may stop CCM services at amy time (effective at the end of the month) by phone call to the office staff. The patient will be responsible for cost sharing (co-pay) or up to 20% of the service fee (after annual deductible is met)  Following is a copy of your full provider care plan:   Goals Addressed             This Visit's Progress    Goal: CCM (Chronic Respiratory Failure) Expected Outcome:  Monitor, Self-Manage And Reduce Symptoms of Chronic Respiratory Failure secondary to ALS       Current Barriers:  Chronic Disease Management support and education needs related to  Transportation barriers  Planned Interventions: Reviewed plan for management of Chronic Respiratory Failure r/t ALS. He remains tracheostomy and ventilator dependent. Spouse reports in-home services are currently being provided by Respiratory Therapy with [facility]. Voices concerns that visits are infrequent and prefers to establish services with an agency can that provide more oversight and trach care. Several attempts were made to identify an agency, however most services were not covered by the insurance and would require private pay.  Previously collaborated with the insurance provider/[employer] and local agencies to establish care with a provider that can make home visits. Confirmed outreach with Dr. Darleene Cleaver and his Physician Assistant. This  team will provide oversight until the insurance provider identifies a team that is covered for in-home respiratory therapy. Discussed symptoms. Spouse reports patient's symptoms have been well controlled. She is comfortable administering nebulizer and trouble shooting the ventilator as needed.  Reviewed action plan. Patient is able to communicate needs via his Eye Gaze device. Confirmed patient has around the clock care-continues to receive nursing support from [place] with family filling in gaps when coverage is not available.  Discussed infection prevention and patient's increased risks. Advised to utilize prevention strategies to reduce risk of respiratory infection. Pulmonology referral was placed by PCP. Appointment for evaluation has not been scheduled d/t need for stretcher transport.  Pending feedback from [facility] regarding coverage for non-emergent ambulance transport. Thorough discussion of respiratory symptoms that require emergent medical attention.   Symptom Management: Continue communications with [employer] regarding options for non-emergent ambulance transport for medical appointments Administer all medications as prescribed Assess symptoms daily and do not leave patient unattended Ensure patient is properly positioned to use Eye Gaze device Continue communications with the Pulmonology team Notify Dr. Darleene Cleaver or his assigned PA if patient non urgent evaluation Follow recommendations to prevent respiratory infection Notify provider or care management team with questions and new concerns as needed  Follow Up Plan:        Goal: CCM (Severe Protein Calorie Malnutrition) Expected Outcome:  Monitor, Self-Manage And Reduce Symptoms of Severe Protein Calorie Malnutrition secondary to ALS       Current Barriers:  Chronic Disease Management support and education needs related to Severe Protein Calorie Malnutrition Transportation barriers  Planned Interventions: Discussed current plan  for  management of Severe Protein Calorie Malnutrition. A peg tube is in place however it has not been used for feedings since patient was diagnosed with an ileus last fall. Currently receiving TPN via PICC. Spouse reports the [facility] does not provide oversight of TPN. Reports [facility] provides the TPN solution and monitors labs.  Discussed preference to return to feedings via peg tube. Aware that he will need to be assessed by the Gastroenterology team to determine is ileus has cleared. Will also need to establish a feeding plan based on patient's current weight and nutritional requirements. PCP placed a referral for Gastroenterology. Patient will require transport via stretcher.  Appointment is pending availability of non-emergent ambulance transport. Advised to continue working with [facility] to determine which transportation is covered. Discussed current symptoms and TPN management. Spouse is comfortable changing TPN bags and completing routine PICC flushing. Thorough discussion regarding s/sx of central line infection and indications for seeking medical attention.  Reports he has been managing TPN well. He has experienced significant weight loss and muscle wasting but spouse feels he is currently stable. Will follow up with the Gastroenterology team regarding plan for evaluation. Discussed need to contact Dr. Darleene Cleaver or his assigned PA if patient require non urgent evaluation in the home.  Reviewed worsening s/sx that require immediate medical attention.  Symptom Management: Administer all medications as prescribed Continue communications with [facility] regarding covered options for non-emergent ambulance transport Continue administering TPN as prescribed. Follow instructions for PICC flushing  Monitor for s/sx of central line infection and notify provider as recommended Contact provider or care management team with questions and new concerns as needed.   Follow Up Plan:  Will follow up next month          Patient's spouse/caregiver verbalized understanding of the information and care plan discussed today. Agreed to view in MyChart  A member of the care management team will follow up next month.  [facility]/Chronic Care Management [phone]

## 2023-02-09 NOTE — Telephone Encounter (Signed)
  Care Management     Name: Peterjohn Stavig MRN: [id number] DOB: [birth year]  Subjective: Jerry [name] is a 52 y.o. year old male who is a primary care patient of [name], MD.   CCM referral requested. Will follow up with Mr. Vondran and his spouse within the next two weeks.   [facility]/Chronic Care Management [phone]

## 2023-02-10 ENCOUNTER — Other Ambulatory Visit: Payer: Medicare HMO

## 2023-02-17 ENCOUNTER — Other Ambulatory Visit: Payer: Self-pay

## 2023-02-18 ENCOUNTER — Other Ambulatory Visit: Payer: Medicare HMO

## 2023-02-19 LAB — LAB REPORT - SCANNED: EGFR: 149

## 2023-02-21 ENCOUNTER — Ambulatory Visit: Payer: Self-pay

## 2023-02-21 NOTE — Telephone Encounter (Signed)
Copied from CRM [id number]. Topic: General - Other >> Feb 21, 2023 10:18 AM Dondra Prader A wrote: Reason for CRM: Megan Clinical Case Manager with [facility] states that there is bloody drainage from the plug site and pt is over due for his peg tube to be changed. Aundra Millet is wanting to know if his PCP knows what is going on and what steps need to be taken for his peg tube to be changed and if pt need to go to the hospital or if transportation can be provided for him to see his PCP. Please call Megan back.

## 2023-02-21 NOTE — Telephone Encounter (Signed)
   Chief Complaint: Erin with [facility] reports pt.'s PEG tube insertion site looks infected - green drainage, redness. Concerned it needs to be changed. Inserted last December. Symptoms: Above Frequency: 1 week Pertinent Negatives: Patient denies fever or any other symptoms Disposition: [] ED /[] Urgent Care (no appt availability in office) / [] Appointment(In office/virtual)/ []  [facility]/ [] Home Care/ [] Refused Recommended Disposition /[] [place] [facility]/ [x]  Follow-up with PCP Additional Notes: Asking for assistance from PCP with having tube changed. Please advise Erin.  Reason for Disposition  [1] G-tube (or PEG), J-tube, or GJ-tube SITE looks infected (spreading redness) AND [2] getting worse (no fever)  Answer Assessment - Initial Assessment Questions 1. MAIN CONCERN OR SYMPTOM:  "What is your main concern right now?" "What question do you have?" "What's the main symptom you're worried about?" (e.g., fever, pain, redness, swelling)     Stoma inflamed 2. ONSET: "When did the symptom or problem start (or worsen)?" (minutes, hours, days, weeks)     1 week ago 3. WHAT TYPE: "What type of feeding tube is it?" (e.g., NG tube, NJ tube, G tube, GJ tube, J tube, PEG).     Medication only 4. WHEN INSERTED: "When was the tube put in", "Has it been changed, if so when?"     December 5. WHO INSERTED: "Do you recall who put the tube in?"     In hospital 6. FEEDINGS: "Has the type, rate or concentration of the tube feedings changed?"     N/a 7. MEDICINES:  "Are you taking any medications via your feeding tube?"  "Are these medications liquid or crushed given in your feeding tube?"      Yes 8. VOMITING and DIARRHEA: "Is there new vomiting or diarrhea?" (e.g., number of time; description)     No 9. OTHER SYMPTOMS: "What other symptoms are you having?" (e.g., shortness of breath, fever, abdomen pain)     Green drainage, red 10. HOME HEALTH NURSE: "Do you have a home health  (visiting) nurse?"       Yes  Protocols used: Feeding Tube Symptoms and Questions-A-AH

## 2023-02-22 NOTE — Telephone Encounter (Signed)
No number supplied for [facility], callled wife w/no answer

## 2023-02-26 LAB — HEPATIC FUNCTION PANEL
ALT: 79 U/L — AB (ref 10–40)
AST: 36 (ref 14–40)
Alkaline Phosphatase: 183 — AB (ref 25–125)
Bilirubin, Total: 0.8

## 2023-02-26 LAB — BASIC METABOLIC PANEL
BUN: 18 (ref 4–21)
CO2: 20 (ref 13–22)
Chloride: 103 (ref 99–108)
Creatinine: 0.2 — AB (ref 0.6–1.3)
Glucose: 142
Potassium: 3.6 mEq/L (ref 3.5–5.1)
Sodium: 139 (ref 137–147)

## 2023-02-26 LAB — CBC AND DIFFERENTIAL
HCT: 33 — AB (ref 41–53)
Hemoglobin: 11.3 — AB (ref 13.5–17.5)
Platelets: 119 10*3/uL — AB (ref 150–400)
WBC: 5.3

## 2023-02-26 LAB — COMPREHENSIVE METABOLIC PANEL
Albumin: 3.9 (ref 3.5–5.0)
Calcium: 9.1 (ref 8.7–10.7)
Globulin: 2.8
eGFR: 154

## 2023-02-26 LAB — CBC: RBC: 3.88 (ref 3.87–5.11)

## 2023-03-05 LAB — LAB REPORT - SCANNED: EGFR: 150

## 2023-03-10 ENCOUNTER — Encounter: Payer: Self-pay | Admitting: Family Medicine

## 2023-03-11 ENCOUNTER — Telehealth: Payer: Self-pay

## 2023-03-11 NOTE — Patient Outreach (Signed)
  Care Management   Visit Note  03/11/2023 Name: Jerry [name] MRN: [id number] DOB: Oct 08, 1970  Subjective: Jerry [name] is a 51 y.o. year old male who is a primary care patient of [name], MD. The Care Management team was consulted for assistance.      Returned call to spouse regarding option for in-home care and provider visits.   PLAN:  Left a HIPAA compliant voice message requesting a return call.    [facility]  [facility] RN Care Manager Direct Dial: [phone] Website: [email or web address]

## 2023-03-14 ENCOUNTER — Other Ambulatory Visit: Payer: Self-pay | Admitting: Family Medicine

## 2023-03-14 DIAGNOSIS — G1221 Amyotrophic lateral sclerosis: Secondary | ICD-10-CM

## 2023-03-19 LAB — LAB REPORT - SCANNED: EGFR: 147

## 2023-03-29 ENCOUNTER — Other Ambulatory Visit: Payer: Self-pay

## 2023-03-29 NOTE — Patient Instructions (Signed)
Thank you for allowing the Care Management team to participate in your care.

## 2023-04-02 LAB — LAB REPORT - SCANNED: EGFR: 143

## 2023-04-04 ENCOUNTER — Other Ambulatory Visit: Payer: Self-pay

## 2023-04-04 ENCOUNTER — Emergency Department (HOSPITAL_COMMUNITY)
Admission: EM | Admit: 2023-04-04 | Discharge: 2023-04-04 | Disposition: A | Discharge status: Home / Self Care | Payer: Medicare HMO | Attending: Emergency Medicine | Admitting: Emergency Medicine

## 2023-04-04 ENCOUNTER — Emergency Department (HOSPITAL_COMMUNITY): Payer: Medicare HMO

## 2023-04-04 DIAGNOSIS — Z9911 Dependence on respirator [ventilator] status: Secondary | ICD-10-CM | POA: Diagnosis not present

## 2023-04-04 DIAGNOSIS — Z431 Encounter for attention to gastrostomy: Secondary | ICD-10-CM

## 2023-04-04 DIAGNOSIS — K9423 Gastrostomy malfunction: Secondary | ICD-10-CM | POA: Insufficient documentation

## 2023-04-04 DIAGNOSIS — T82898A Other specified complication of vascular prosthetic devices, implants and grafts, initial encounter: Secondary | ICD-10-CM | POA: Diagnosis present

## 2023-04-04 LAB — CBG MONITORING, ED
Glucose-Capillary: 161 mg/dL — ABNORMAL HIGH (ref 70–99)
Glucose-Capillary: 70 mg/dL (ref 70–99)

## 2023-04-04 MED ORDER — SODIUM CHLORIDE 0.9% FLUSH: 10.0000 mL | INTRAVENOUS | Status: DC | PRN

## 2023-04-04 MED ORDER — DIATRIZOATE MEGLUMINE & SODIUM 66-10 % PO SOLN
30.0000 mL | Freq: Once | ORAL | Status: AC
  Administered 2023-04-04: 30 mL
  Filled 2023-04-04: qty 30

## 2023-04-04 MED ORDER — SODIUM CHLORIDE 0.9 % IV BOLUS
1000.0000 mL | Freq: Once | INTRAVENOUS | Status: AC
  Administered 2023-04-04: 1000 mL via INTRAVENOUS

## 2023-04-04 MED ORDER — DEXTROSE 50 % IV SOLN
1.0000 | Freq: Once | INTRAVENOUS | Status: AC
  Administered 2023-04-04: 50 mL via INTRAVENOUS
  Filled 2023-04-04: qty 50

## 2023-04-04 MED ORDER — SODIUM CHLORIDE 0.9% FLUSH
10.0000 mL | Freq: Two times a day (BID) | INTRAVENOUS | Status: DC
  Administered 2023-04-04: 20 mL

## 2023-04-04 MED ORDER — CHLORHEXIDINE GLUCONATE CLOTH 2 % EX PADS: 6.0000 | MEDICATED_PAD | Freq: Every day | CUTANEOUS | Status: DC

## 2023-04-04 NOTE — ED Notes (Signed)
IV team at bedside. States pt PICC line will need exchanged. Orders in. Will continue to monitor.

## 2023-04-04 NOTE — Care Management (Signed)
ED RNCM sent order to [facility] for PEG tube syringes for home use. ED RNCM attempted to contact spouse by phone tonight no answer. RNCM will make second attempt tomorrow afternoon 10/1. Patient and wife are concerned with community care follow up. RNCM will reach out to the [facility] Care Management team.  Updated EDP on this plan.     Michel Bickers RN, BSN CNOR ED RN Care Manager [phone]

## 2023-04-04 NOTE — ED Provider Notes (Signed)
[name] EMERGENCY DEPARTMENT AT [facility] Provider Note   CSN: [id number] Arrival date & time: 04/04/23  1226     History  Chief Complaint  Patient presents with   Vascular Access Problem    Jerry [name] is a 52 y.o. male with past medical history significant for ALS currently tracheostomy, ventilator dependent, with percutaneous endoscopic gastrostomy tube, as well as PICC line in left arm for TPN who presents with concern for broken clamp to PICC line in left arm since last night.  There is a plastic clamp overriding the broken clamp to stop from bleeding.  Additionally they report some issues with G-tube leaking, difficulty with passing fluids since last night, but has been having some problems for several months.  Patient denies any other issues at this time, denies any significant pain, reports PEG tube was changed around December of last year.  HPI     Home Medications Prior to Admission medications   Medication Sig Start Date End Date Taking? Authorizing Provider  glycopyrrolate (ROBINUL) 1 MG tablet Place 1 tablet (1 mg total) into feeding tube 2 (two) times daily as needed. 07/22/22   Sowles, Danna Hefty, MD  ipratropium-albuterol (DUONEB) 0.5-2.5 (3) MG/3ML SOLN USE 3 ML VIA NEBULIZER EVERY 6 HOURS AS NEEDED FOR SHORTNESS OF BREATH 12/09/22   Carlynn Purl, Danna Hefty, MD  levofloxacin (LEVAQUIN) 750 MG tablet Place 1 tablet (750 mg total) into feeding tube daily. Crush for tube feeds. 08/12/22   Eber Hong, MD  polyethylene glycol powder (GLYCOLAX/MIRALAX) 17 GM/SCOOP powder Place 17 g into feeding tube daily. Titrate as needed for constipation 07/22/22   [name], MD  PRESCRIPTION MEDICATION Home TPN Patient not taking: Reported on 07/22/2022    [provider]  scopolamine (TRANSDERM-SCOP) 1 MG/3DAYS APPLY 1 PATCH(1.5 MG) TOPICALLY TO THE SKIN EVERY 3 DAYS 03/14/23   [name], MD      Allergies    Parke Simmers allergy]    Review of Systems    Review of Systems  All other systems reviewed and are negative.   Physical Exam Updated Vital Signs BP 115/76   Pulse 72   Temp (!) 97.5 F (36.4 C) (Temporal)   Resp 16   SpO2 100%  Physical Exam Vitals and nursing note reviewed.  Constitutional:      General: He is not in acute distress.    Appearance: Normal appearance.  HENT:     Head: Normocephalic and atraumatic.  Eyes:     General:        Right eye: No discharge.        Left eye: No discharge.  Cardiovascular:     Rate and Rhythm: Normal rate and regular rhythm.     Heart sounds: No murmur heard.    No friction rub. No gallop.  Pulmonary:     Effort: Pulmonary effort is normal.     Breath sounds: Normal breath sounds.  Abdominal:     Comments: G-tube appropriately in place with no evidence of skin breakdown, skin infection, or other injury at insertion site  Skin:    General: Skin is warm and dry.     Capillary Refill: Capillary refill takes less than 2 seconds.     Comments: PICC line in left upper extremity appears appropriately placed, there is a broken clamp on one of the 2 access ports that is currently being overridden with a temporary plastic clamp.  Neurological:     Mental Status: He is alert. Mental status is at  baseline.     Comments: Nonverbal, communicates with the computer that registers his facial movements  Psychiatric:        Mood and Affect: Mood normal.        Behavior: Behavior normal.     ED Results / Procedures / Treatments   Labs (all labs ordered are listed, but only abnormal results are displayed) Labs Reviewed - No data to display  EKG None  Radiology [place] EKG Site Rite  Result Date: 04/04/2023 If Site Rite image not attached, placement could not be confirmed due to current cardiac rhythm.   Procedures Procedures    Medications Ordered in ED Medications - No data to display  ED Course/ Medical Decision Making/ A&P                                 Medical Decision  Making  This patient is a 52 y.o. male who presents to the ED for concern of vascular issue with left-sided PICC line, need for replacement, as well as difficulty with using G-tube, family and nurse report that it has been having issues intermittently for months, and has needed replacement for at least 3 months at this time.   Differential diagnoses prior to evaluation: Need for PEG tube placement, need for PICC line replacement  Past Medical History / Social History / Additional history: Chart reviewed. Pertinent results include:  Jerry [name] is a 52 y.o. male with past medical history significant for ALS currently tracheostomy, ventilator dependent, with percutaneous endoscopic gastrostomy tube, as well as PICC line in left arm for TPN  Physical Exam: Physical exam performed. The pertinent findings include:  G-tube appropriately in place with no evidence of skin breakdown, skin infection, or other injury at insertion site, does not flush appropriately however  PICC line in left upper extremity appears appropriately placed, there is a broken clamp on one of the 2 access ports that is currently being overridden with a temporary plastic clamp.   Nonverbal, communicates with the computer that registers his facial movements   Medications / Treatment: PICC line IV team will replace PICC line in left upper extremity.  We will replace G-tube  PICC line being actively exchanged when provider went to attempt G-tube replacement.   3:26 PM Care of Jerry [name] transferred to [facility] and Dr. Charm Barges at the end of my shift as the patient will require reassessment once labs/imaging have resulted. Patient presentation, ED course, and plan of care discussed with review of all pertinent labs and imaging. Please see his/her note for further details regarding further ED course and disposition. Plan at time of handoff is pending G tube replacement, imaging to confirm placement of both. This may be  altered or completely changed at the discretion of the oncoming team pending results of further workup.   Final Clinical Impression(s) / ED Diagnoses Final diagnoses:  None    Rx / DC Orders ED Discharge Orders     None         [place] 04/04/23 1526    Tegeler, Canary Brim, MD 04/04/23 [phone]

## 2023-04-04 NOTE — ED Triage Notes (Signed)
Broken clamp to PICC line to left arm since last night. Home nurse states they clamped it to stop bleeding. Pt is also here due to G-tube leaking since last night. Has not had meds this AM

## 2023-04-04 NOTE — ED Provider Notes (Signed)
+   als Picc and g tube.   Physical Exam  BP 115/76   Pulse 72   Temp (!) 97.5 F (36.4 C) (Temporal)   Resp 16   SpO2 100%   Physical Exam  Procedures  Gastrostomy tube replacement  Date/Time: 04/04/2023 4:51 PM  Performed by: Arthor Captain, PA-C Authorized by: Arthor Captain, PA-C  Consent given by: patient Patient identity confirmed: hospital-assigned identification number Time out: Immediately prior to procedure a "time out" was called to verify the correct patient, procedure, equipment, support staff and site/side marked as required. Preparation: Patient was prepped and draped in the usual sterile fashion. Local anesthesia used: no  Anesthesia: Local anesthesia used: no  Sedation: Patient sedated: no  Patient tolerance: patient tolerated the procedure well with no immediate complications     ED Course / MDM    Medical Decision Making Amount and/or Complexity of Data Reviewed Radiology: ordered.  Risk OTC drugs. Prescription drug management.   I was able to replace the patyient's Gtbue personally, There was return of stomach contents into the tube. Patient's glucose became low, given D50. I personally visualized and interpreted the images using our PACS system. Xray of the abdomen also suggests the G tube is in place.  Patient repeat cbg- 161 Patient safe for discharge at this time        Arthor Captain, Cordelia Poche 04/07/23 1819    Terrilee Files, MD 04/08/23 1040

## 2023-04-04 NOTE — Progress Notes (Signed)
Peripherally Inserted Central Catheter Placement  The IV Nurse has discussed with the patient and/or persons authorized to consent for the patient, the purpose of this procedure and the potential benefits and risks involved with this procedure.  The benefits include less needle sticks, lab draws from the catheter, and the patient may be discharged home with the catheter. Risks include, but not limited to, infection, bleeding, blood clot (thrombus formation), and puncture of an artery; nerve damage and irregular heartbeat and possibility to perform a PICC exchange if needed/ordered by physician.  Alternatives to this procedure were also discussed.  Bard Power PICC patient education guide, fact sheet on infection prevention and patient information card has been provided to patient /or left at bedside.    PICC Placement Documentation  PICC Double Lumen 06/30/22 Left Brachial 47 cm 0 cm (Active)     PICC Double Lumen 06/30/22 Left 47 cm (Active)  Indication for Insertion or Continuance of Line Administration of hyperosmolar/irritating solutions (i.e. TPN, Vancomycin, etc.) 04/04/23 1336  Site Assessment Clean, Dry, Intact 04/04/23 1336  Lumen #1 Status Saline locked 04/04/23 1336  Lumen #2 Status Saline locked 04/04/23 1336  Dressing Type Transparent 04/04/23 1336     PICC Double Lumen 04/04/23 Left Brachial 47 cm 2 cm (Active)  Indication for Insertion or Continuance of Line Administration of hyperosmolar/irritating solutions (i.e. TPN, Vancomycin, etc.) 04/04/23 1532  Exposed Catheter (cm) 2 cm 04/04/23 1532  Site Assessment Clean, Dry, Intact 04/04/23 1532  Lumen #1 Status Flushed;Blood return noted;Saline locked 04/04/23 1532  Lumen #2 Status Flushed;Blood return noted;Saline locked 04/04/23 1532  Dressing Type Transparent 04/04/23 1532  Dressing Status Antimicrobial disc in place 04/04/23 1532  Line Care Connections checked and tightened 04/04/23 1532  Line Adjustment (NICU/IV Team Only) No  04/04/23 1532  Dressing Intervention New dressing 04/04/23 1532  Dressing Change Due 04/11/23 04/04/23 1532       Audrie Gallus 04/04/2023, 3:34 PM

## 2023-04-04 NOTE — Progress Notes (Signed)
Left PICC assessed, clamp is broken on red port will need exchange ordered.  Patient requires replacement with DL due to 18 hours/day TPN and need for labs and possible other medications.  Bedside RN aware, exchange ordered, PICC team notified

## 2023-04-04 NOTE — ED Notes (Signed)
Provider notified of pt bp. IVF started.

## 2023-04-04 NOTE — Discharge Instructions (Signed)
Anola Gurney will call you regarding your home health doc. Return to the ed for any new or worsening issues.

## 2023-04-04 NOTE — ED Notes (Signed)
PT  IS NOW 3 ON THE LIST FRO PTAR

## 2023-04-04 NOTE — ED Notes (Signed)
G-tube flushed at this time. No complications. G-tube flushed well, no resistance. Provider notified.

## 2023-04-05 ENCOUNTER — Telehealth: Payer: Self-pay | Admitting: Surgery

## 2023-04-05 ENCOUNTER — Other Ambulatory Visit: Payer: Self-pay

## 2023-04-05 NOTE — Telephone Encounter (Signed)
ED RNCM received tube feed and supplies order form from [employer], forwarded the form to Juanell Fairly RN patient's Case Manager who will follow up, Sent message to update patient's spouse. Available for any further assistance if needed.   Michel Bickers RN, BSN CNOR ED RN Care Manager [phone]

## 2023-04-05 NOTE — Patient Instructions (Signed)
Thank you for allowing the Care Management team to participate in your care.
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
## 2023-04-05 NOTE — Patient Outreach (Signed)
Care Management   Visit Note   Name: Jerry [name] MRN: [id number] DOB: 1970-08-24  Subjective: Jerry [name] is a 52 y.o. year old male who is a primary care patient of [name], MD. The Care Management team was consulted for assistance.      Engaged with patient and spouse via telephone.  Assessment:  Outpatient Encounter Medications as of 02/18/2023  Medication Sig   glycopyrrolate (ROBINUL) 1 MG tablet Place 1 tablet (1 mg total) into feeding tube 2 (two) times daily as needed.   ipratropium-albuterol (DUONEB) 0.5-2.5 (3) MG/3ML SOLN USE 3 ML VIA NEBULIZER EVERY 6 HOURS AS NEEDED FOR SHORTNESS OF BREATH   levofloxacin (LEVAQUIN) 750 MG tablet Place 1 tablet (750 mg total) into feeding tube daily. Crush for tube feeds.   polyethylene glycol powder (GLYCOLAX/MIRALAX) 17 GM/SCOOP powder Place 17 g into feeding tube daily. Titrate as needed for constipation   PRESCRIPTION MEDICATION Home TPN (Patient not taking: Reported on 07/22/2022)   [DISCONTINUED] scopolamine (TRANSDERM-SCOP) 1 MG/3DAYS APPLY 1 PATCH(1.5 MG) TOPICALLY TO THE SKIN EVERY 3 DAYS   No facility-administered encounter medications on file as of 02/18/2023.     Interventions:   Goals Addressed             This Visit's Progress    Current Barriers:  Care Management support and education needs related to  Transportation barriers  Planned Interventions: Reviewed plan for management of Chronic Respiratory Failure r/t ALS. He remains tracheostomy and ventilator dependent. Spouse reports in-home services are currently being provided by Respiratory Therapy with [facility]. Voices concerns that visits are infrequent and prefers to establish services with an agency can that provide more oversight and trach care. Several attempts were made to identify an agency, however most services were not covered by the insurance and would require private pay.  Previously collaborated with the insurance provider/[employer] and local agencies  to establish care with a provider that can make home visits. Confirmed outreach with Dr. Darleene Cleaver and his Physician Assistant. This team will provide oversight until the insurance provider identifies a team that is covered for in-home respiratory therapy. Discussed symptoms. Spouse reports patient's symptoms have been well controlled. She is comfortable administering nebulizer and trouble shooting the ventilator as needed.  Reviewed action plan. Patient is able to communicate needs via his Eye Gaze device. Confirmed patient has around the clock care-continues to receive nursing support from [place] with family filling in gaps when coverage is not available.  Discussed infection prevention and patient's increased risks. Advised to utilize prevention strategies to reduce risk of respiratory infection. Pulmonology referral was placed by PCP. Appointment for evaluation has not been scheduled d/t need for stretcher transport.  Pending feedback from [facility] regarding coverage for non-emergent ambulance transport. Thorough discussion of respiratory symptoms that require emergent medical attention.  Symptom Management: Continue communications with [employer] regarding options for non-emergent ambulance transport for medical appointments Administer all medications as prescribed Assess symptoms daily and do not leave patient unattended Ensure patient is properly positioned to use Eye Gaze device Continue communications with the Pulmonology team Notify Dr. Darleene Cleaver or his assigned PA if patient non urgent evaluation Follow recommendations to prevent respiratory infection Notify provider or care management team with questions and new concerns as needed        Current Barriers:  Care Management support and education needs related to Severe Protein Calorie Malnutrition Transportation barriers  Planned Interventions: Discussed current plan for management of Severe Protein Calorie Malnutrition. Spouse reports  drainage around the  entry of patient's peg tube. Reports the home health team has been placing gauze around the site to absorb the drainage, however notes the plan is inconsistent d/t some staff preferring not to change the gauze more than once a day without a provider order.  Thorough discussion regarding s/sx of infection.  Discussed risk of skin breakdown if fluid/drainage remain on the skin. Spouse verbalized understanding of instructions. Reports increased drainage to the site but denies pus or blood in drainage. Verbalized understanding of instructions and indications for seeking immediate medical attention. Reviewed plan for TPN management. Spouse remains comfortable changing TPN bags and completing routine PICC flushing. Reports nutrition has been administered well.   Reviewed worsening s/sx that require immediate medical attention.  Symptom Management: Administer all medications as prescribed Continue communications with [facility] regarding covered options for non-emergent ambulance transport Continue administering TPN as prescribed. Follow instructions for PICC flushing  Monitor for s/sx of central line infection and notify provider as recommended Contact provider or care management team with questions and new concerns as needed.            PLAN Will follow up within the next month.   Katina [facility]  [facility], [facility] RN Care Manager Direct Dial: [phone] Website: [email or web address]
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
## 2023-04-05 NOTE — Patient Outreach (Signed)
  Care Management   Visit Note  04/05/2023 Name: Jerry [name] MRN: [id number] DOB: [birth year]  Subjective: Jerry [name] is a 52 y.o. year old male who is a primary care patient of Jerry Cory, MD. The Care Management team was consulted for assistance.      Engaged with patient's caregiver/spouse via telephone.  Assessment:  Outpatient Encounter Medications as of 04/05/2023  Medication Sig   glycopyrrolate (ROBINUL) 1 MG tablet Place 1 tablet (1 mg total) into feeding tube 2 (two) times daily as needed.   ipratropium-albuterol (DUONEB) 0.5-2.5 (3) MG/3ML SOLN USE 3 ML VIA NEBULIZER EVERY 6 HOURS AS NEEDED FOR SHORTNESS OF BREATH   polyethylene glycol powder (GLYCOLAX/MIRALAX) 17 GM/SCOOP powder Place 17 g into feeding tube daily. Titrate as needed for constipation   PRESCRIPTION MEDICATION Home TPN   scopolamine (TRANSDERM-SCOP) 1 MG/3DAYS APPLY 1 PATCH(1.5 MG) TOPICALLY TO THE SKIN EVERY 3 DAYS   levofloxacin (LEVAQUIN) 750 MG tablet Place 1 tablet (750 mg total) into feeding tube daily. Crush for tube feeds. (Patient not taking: Reported on 04/05/2023)   No facility-administered encounter medications on file as of 04/05/2023.     Interventions:    PLAN    [name]  [facility], [facility] RN Care Manager Direct Dial: [phone] Website: [email or web address]

## 2023-04-05 NOTE — Telephone Encounter (Signed)
Error

## 2023-04-06 ENCOUNTER — Other Ambulatory Visit: Payer: Self-pay

## 2023-04-06 NOTE — Patient Instructions (Signed)
Thank you for allowing the Care Management team to participate in your care.  It was great speaking with you today!

## 2023-04-06 NOTE — Patient Outreach (Signed)
  Care Management   Visit Note  04/06/2023 Name: Jerry [name] MRN: [id number] DOB: 1971/02/28  Subjective: Jerry [name] is a 52 y.o. year old male who is a primary care patient of [name], MD. The Care Management team was consulted for assistance.      Engaged with patient's spouse/caregiver and [facility] manager Erin via telephone.  Assessment:  Outpatient Encounter Medications as of 04/06/2023  Medication Sig   glycopyrrolate (ROBINUL) 1 MG tablet Place 1 tablet (1 mg total) into feeding tube 2 (two) times daily as needed.   ipratropium-albuterol (DUONEB) 0.5-2.5 (3) MG/3ML SOLN USE 3 ML VIA NEBULIZER EVERY 6 HOURS AS NEEDED FOR SHORTNESS OF BREATH   levofloxacin (LEVAQUIN) 750 MG tablet Place 1 tablet (750 mg total) into feeding tube daily. Crush for tube feeds. (Patient not taking: Reported on 04/05/2023)   polyethylene glycol powder (GLYCOLAX/MIRALAX) 17 GM/SCOOP powder Place 17 g into feeding tube daily. Titrate as needed for constipation   PRESCRIPTION MEDICATION Home TPN   scopolamine (TRANSDERM-SCOP) 1 MG/3DAYS APPLY 1 PATCH(1.5 MG) TOPICALLY TO THE SKIN EVERY 3 DAYS   No facility-administered encounter medications on file as of 04/06/2023.    Interventions:  Plan:

## 2023-04-09 LAB — BASIC METABOLIC PANEL
BUN: 17 (ref 4–21)
CO2: 19 (ref 13–22)
Chloride: 105 (ref 99–108)
Creatinine: 0.2 — AB (ref 0.6–1.3)
Glucose: 156
Potassium: 4 meq/L (ref 3.5–5.1)
Sodium: 140 (ref 137–147)

## 2023-04-09 LAB — CBC: RBC: 3.84 — AB (ref 3.87–5.11)

## 2023-04-09 LAB — HEPATIC FUNCTION PANEL
ALT: 51 U/L — AB (ref 10–40)
AST: 29 (ref 14–40)
Alkaline Phosphatase: 222 — AB (ref 25–125)
Bilirubin, Total: 0.7

## 2023-04-09 LAB — CBC AND DIFFERENTIAL
EGFR: 156
HCT: 32 — AB (ref 41–53)
Hemoglobin: 10.2 — AB (ref 13.5–17.5)
Neutrophils Absolute: 2.9
Platelets: 143 10*3/uL — AB (ref 150–400)
WBC: 4.9

## 2023-04-09 LAB — COMPREHENSIVE METABOLIC PANEL
Albumin: 4.1 (ref 3.5–5.0)
Calcium: 9.2 (ref 8.7–10.7)
eGFR: 156

## 2023-04-13 ENCOUNTER — Telehealth: Payer: Self-pay

## 2023-04-13 NOTE — Patient Outreach (Signed)
  Care Management   Outreach Note  04/13/2023 Name: Declan Mier MRN: [id number] DOB: [birth year]  An unsuccessful telephone outreach was attempted today to contact the patient about Care Management needs.     Follow Up Plan:  A HIPAA compliant phone message was left for the patient's spouse/caregiver providing contact information and requesting a return call.     Katina Degree [facility]  [facility], [facility] RN Care Manager Direct Dial: [phone] Website: [email or web address]

## 2023-04-16 LAB — LAB REPORT - SCANNED: EGFR: 150

## 2023-04-18 ENCOUNTER — Other Ambulatory Visit: Payer: Self-pay

## 2023-04-18 ENCOUNTER — Encounter: Payer: Self-pay | Admitting: Family Medicine

## 2023-04-18 ENCOUNTER — Other Ambulatory Visit: Payer: Self-pay | Admitting: Family Medicine

## 2023-04-18 DIAGNOSIS — G1221 Amyotrophic lateral sclerosis: Secondary | ICD-10-CM

## 2023-04-18 MED ORDER — SCOPOLAMINE 1 MG/3DAYS TD PT72
1.0000 | MEDICATED_PATCH | TRANSDERMAL | 2 refills | Status: DC
Start: 2023-04-18 — End: 2023-05-31

## 2023-04-18 NOTE — Patient Outreach (Signed)
  Care Management   Visit Note  04/18/2023 Name: Jerry [name] MRN: [id number] DOB: [birth year]  Subjective: Jerry [name] is a 52 y.o. year old male who is a primary care patient of [name], MD. The Care Management team was consulted for assistance.        Assessment:  Outpatient Encounter Medications as of 04/18/2023  Medication Sig Note   glycopyrrolate (ROBINUL) 1 MG tablet Place 1 tablet (1 mg total) into feeding tube 2 (two) times daily as needed.    ipratropium-albuterol (DUONEB) 0.5-2.5 (3) MG/3ML SOLN USE 3 ML VIA NEBULIZER EVERY 6 HOURS AS NEEDED FOR SHORTNESS OF BREATH    levofloxacin (LEVAQUIN) 750 MG tablet Place 1 tablet (750 mg total) into feeding tube daily. Crush for tube feeds. (Patient not taking: Reported on 04/05/2023)    polyethylene glycol powder (GLYCOLAX/MIRALAX) 17 GM/SCOOP powder Place 17 g into feeding tube daily. Titrate as needed for constipation    PRESCRIPTION MEDICATION Home TPN    scopolamine (TRANSDERM-SCOP) 1 MG/3DAYS Place 1 patch (1.5 mg total) onto the skin every 3 (three) days.    [DISCONTINUED] scopolamine (TRANSDERM-SCOP) 1 MG/3DAYS APPLY 1 PATCH(1.5 MG) TOPICALLY TO THE SKIN EVERY 3 DAYS 04/18/2023: Requesting new order   No facility-administered encounter medications on file as of 04/18/2023.     Message received from patient's spouse/caregiver. Reports he is currently out of scopolamine patches. Pharmacy is requiring a new order.  Message forwarded to primary care provider. New order for scopolamine patches submitted.   PLAN Will follow up within the next week.   Katina Degree [facility]  [facility], [facility] Health RN Care Manager Direct Dial: [phone] Website: [email or web address]

## 2023-04-22 LAB — CBC AND DIFFERENTIAL
HCT: 34 — AB (ref 41–53)
Hemoglobin: 10.8 — AB (ref 13.5–17.5)
Platelets: 137 10*3/uL — AB (ref 150–400)
WBC: 5.5

## 2023-04-22 LAB — CBC: RBC: 4.12 (ref 3.87–5.11)

## 2023-04-30 LAB — LAB REPORT - SCANNED: EGFR: 160

## 2023-05-03 ENCOUNTER — Encounter: Payer: Self-pay | Admitting: Family Medicine

## 2023-05-07 LAB — LAB REPORT - SCANNED: EGFR: 152

## 2023-05-09 ENCOUNTER — Other Ambulatory Visit: Payer: Medicare HMO

## 2023-05-09 NOTE — Patient Outreach (Unsigned)
  Care Management   Visit Note  05/09/2023 Name: Akram Kissick MRN: [id number] DOB: 21-Dec-1970  Subjective: Earnest Mcgillis is a 52 y.o. year old male who is a primary care patient of [name], MD. The Care Management team was consulted for assistance.      Engaged with patient's spouse/caregiver via telephone  Assessment:  Outpatient Encounter Medications as of 05/09/2023  Medication Sig   polyethylene glycol powder (GLYCOLAX/MIRALAX) 17 GM/SCOOP powder Place 17 g into feeding tube daily. Titrate as needed for constipation   PRESCRIPTION MEDICATION Home TPN   scopolamine (TRANSDERM-SCOP) 1 MG/3DAYS Place 1 patch (1.5 mg total) onto the skin every 3 (three) days.   glycopyrrolate (ROBINUL) 1 MG tablet Place 1 tablet (1 mg total) into feeding tube 2 (two) times daily as needed.   ipratropium-albuterol (DUONEB) 0.5-2.5 (3) MG/3ML SOLN USE 3 ML VIA NEBULIZER EVERY 6 HOURS AS NEEDED FOR SHORTNESS OF BREATH   levofloxacin (LEVAQUIN) 750 MG tablet Place 1 tablet (750 mg total) into feeding tube daily. Crush for tube feeds. (Patient not taking: Reported on 04/05/2023)   No facility-administered encounter medications on file as of 05/09/2023.

## 2023-05-10 ENCOUNTER — Other Ambulatory Visit: Payer: Self-pay

## 2023-05-10 ENCOUNTER — Telehealth: Payer: Self-pay | Admitting: Family Medicine

## 2023-05-10 DIAGNOSIS — Z930 Tracheostomy status: Secondary | ICD-10-CM

## 2023-05-10 DIAGNOSIS — G1221 Amyotrophic lateral sclerosis: Secondary | ICD-10-CM

## 2023-05-10 DIAGNOSIS — Z9911 Dependence on respirator [ventilator] status: Secondary | ICD-10-CM

## 2023-05-10 NOTE — Telephone Encounter (Signed)
Aundra Millet, patient's clinical case manager/private duty nurse, has called to request an order oral swabs for the patient through the DME company. Per Aundra Millet, DME will not provide any swabs without a doctor's order. Please advise.  Megan's callback # [phone]

## 2023-05-10 NOTE — Telephone Encounter (Signed)
Ordered

## 2023-05-12 ENCOUNTER — Encounter: Payer: Self-pay | Admitting: Family Medicine

## 2023-05-12 ENCOUNTER — Other Ambulatory Visit: Payer: Medicare HMO

## 2023-05-12 ENCOUNTER — Ambulatory Visit: Payer: Self-pay

## 2023-05-12 NOTE — Telephone Encounter (Signed)
Summary: question regarding medication  PRN but taken daily   Nurse Ramatou would like to discuss the medication, glycopyrrolate (ROBINUL) 1 MG tablet ....currently it is PRN but he is taking it every day.     Chief Complaint: Pt. Currently taking Robinul every day instead of prn. Pt. Has had excessive drooling and the medication helps. Would like to continue this because this has helped. Symptoms: n/a Frequency: n/a Pertinent Negatives: Patient denies  Disposition: [] ED /[] Urgent Care (no appt availability in office) / [] Appointment(In office/virtual)/ []  [facility]/ [] Home Care/ [] Refused Recommended Disposition /[] [facility]/ [x]  Follow-up with PCP Additional Notes: Please advise wife.  Reason for Disposition  [1] Caller has URGENT medicine question about med that PCP or specialist prescribed AND [2] triager unable to answer question  Answer Assessment - Initial Assessment Questions 1. NAME of MEDICINE: "What medicine(s) are you calling about?"     Robinul 2. QUESTION: "What is your question?" (e.g., double dose of medicine, side effect)     Pt. Is taking it every day. Still has excessive salivation, drooling 3. PRESCRIBER: "Who prescribed the medicine?" Reason: if prescribed by specialist, call should be referred to that group.     Dr. Carlynn Purl 4. SYMPTOMS: "Do you have any symptoms?" If Yes, ask: "What symptoms are you having?"  "How bad are the symptoms (e.g., mild, moderate, severe)      5. PREGNANCY:  "Is there any chance that you are pregnant?" "When was your last menstrual period?"     N/a  Protocols used: Medication Question Call-A-AH

## 2023-05-13 LAB — HEPATIC FUNCTION PANEL
ALT: 64 U/L — AB (ref 10–40)
AST: 33 (ref 14–40)
Alkaline Phosphatase: 227 — AB (ref 25–125)
Bilirubin, Total: 0.9

## 2023-05-13 LAB — CBC AND DIFFERENTIAL
HCT: 33 — AB (ref 41–53)
Hemoglobin: 10.9 — AB (ref 13.5–17.5)
Platelets: 134 K/uL — AB (ref 150–400)
WBC: 5.6

## 2023-05-13 LAB — BASIC METABOLIC PANEL WITH GFR
BUN: 18 (ref 4–21)
CO2: 20 (ref 13–22)
Chloride: 101 (ref 99–108)
Creatinine: 0.3 — AB (ref 0.6–1.3)
Glucose: 165
Potassium: 3.4 meq/L — AB (ref 3.5–5.1)
Sodium: 139 (ref 137–147)

## 2023-05-13 LAB — CBC: RBC: 3.99 (ref 3.87–5.11)

## 2023-05-13 LAB — COMPREHENSIVE METABOLIC PANEL WITH GFR
Albumin: 4.1 (ref 3.5–5.0)
Calcium: 9.4 (ref 8.7–10.7)
Globulin: 3.2
eGFR: 141

## 2023-05-16 NOTE — Patient Outreach (Signed)
  Care Management   Visit Note   Name: Jerry [name] MRN: [id number] DOB: Dec 29, 1970  Subjective: Jerry [name] is a 52 y.o. year old male who is a primary care patient of [name], MD. The Care Management team was consulted for assistance.      Engaged with patient via telephone.  Assessment:  Outpatient Encounter Medications as of 05/12/2023  Medication Sig   glycopyrrolate (ROBINUL) 1 MG tablet Place 1 tablet (1 mg total) into feeding tube 2 (two) times daily as needed.   ipratropium-albuterol (DUONEB) 0.5-2.5 (3) MG/3ML SOLN USE 3 ML VIA NEBULIZER EVERY 6 HOURS AS NEEDED FOR SHORTNESS OF BREATH   levofloxacin (LEVAQUIN) 750 MG tablet Place 1 tablet (750 mg total) into feeding tube daily. Crush for tube feeds. (Patient not taking: Reported on 04/05/2023)   polyethylene glycol powder (GLYCOLAX/MIRALAX) 17 GM/SCOOP powder Place 17 g into feeding tube daily. Titrate as needed for constipation   PRESCRIPTION MEDICATION Home TPN   scopolamine (TRANSDERM-SCOP) 1 MG/3DAYS Place 1 patch (1.5 mg total) onto the skin every 3 (three) days.   No facility-administered encounter medications on file as of 05/12/2023.

## 2023-05-22 LAB — LAB REPORT - SCANNED: EGFR: 147

## 2023-05-24 ENCOUNTER — Encounter: Payer: Self-pay | Admitting: Family Medicine

## 2023-05-26 ENCOUNTER — Other Ambulatory Visit: Payer: Self-pay

## 2023-05-27 ENCOUNTER — Other Ambulatory Visit: Payer: Self-pay

## 2023-05-28 LAB — LAB REPORT - SCANNED: EGFR: 150

## 2023-05-30 NOTE — Patient Outreach (Addendum)
Care Management   Visit Note   Name: Jerry [name] MRN: [id number] DOB: 1971-02-18  Subjective: Jerry [name] is a 52 y.o. year old male who is a primary care patient of Jerry [name]. The Care Management team was consulted for assistance.      Engaged with patient's spouse/caregiver via telephone.   Assessment:  Outpatient Encounter Medications as of 05/27/2023  Medication Sig   glycopyrrolate (ROBINUL) 1 MG tablet Place 1 tablet (1 mg total) into feeding tube 2 (two) times daily as needed.   ipratropium-albuterol (DUONEB) 0.5-2.5 (3) MG/3ML SOLN USE 3 ML VIA NEBULIZER EVERY 6 HOURS AS NEEDED FOR SHORTNESS OF BREATH   levofloxacin (LEVAQUIN) 750 MG tablet Place 1 tablet (750 mg total) into feeding tube daily. Crush for tube feeds. (Patient not taking: Reported on 04/05/2023)   polyethylene glycol powder (GLYCOLAX/MIRALAX) 17 GM/SCOOP powder Place 17 g into feeding tube daily. Titrate as needed for constipation   PRESCRIPTION MEDICATION Home TPN   scopolamine (TRANSDERM-SCOP) 1 MG/3DAYS Place 1 patch (1.5 mg total) onto the skin every 3 (three) days.   No facility-administered encounter medications on file as of 05/27/2023.      Goals Addressed             This Visit's Progress    Care Management       Current Barriers:  Care Management support and education needs related to ALS Transportation barriers  Interventions: Reviewed plan for management of ALS.  Currently receiving TPN and remains ventilator dependent with in-home nursing care. Spouse reports he has been doing well. No recent complications. Reviewed medications. Call was placed to Pharmacy today regarding medication orders and refills. Spouse will obtain nebulizer solution later today.  Discussed plan r/t evaluations with Pulmonology. The Pulmonology clinic was previously contacted. The team is currently unsure if he needs to schedule for a clinic visit or visit in the tracheostomy clinic. Agreed to follow up  with RNCM.  Discussed plan regarding transition from TPN to tube feedings. Reports Jerry [name] recommended weaning off of TPN and restarting tube feeds. Jerry [name] will require evaluation with the gastroenterologist and dietician/nutritionist prior to the transition. Will follow up with Dr. Gerilyn Nestle team regarding status for referral. Discussed plan for evaluation by a physical therapist. Jerry [name] remains bedbound and currently requires stretcher transport to medical appointments. This has been a barrier for timely medical evaluations d/t his insurance provider not currently covering non emergent stretcher transport. Recommended that PT evaluate Jerry [name] for safe transfer from his hospital bed to a wheelchair using a hoyer lift. Spouse reports he has not used his wheelchair since being ventilator dependent. She is agreeable to this plan as it will open options for medical transportation.  Discussed plan for supplies and DME. Reports some supplies have been received. The are still having difficulty obtaining incontinence supplies and other small items. Unsure if the supplies were not available or if they are not covered by his plan.  We will complete a conference call later this week with the insurance provider regarding supplies and updated options for transportation. Update 05/27/23: Attempted conference call today with family and the [facility] point of contact regarding updated r/t transportation options. Pending call back. Spouse reports Jerry [name] experienced increased saliva production a few days ago. Reports the symptoms have resolved and has been resting well. Discussed plan for in-home coverage in December. Anticipates primary nurse being out for approximately two weeks. Advised to contact [facility] regarding plan for coverage during these  two weeks.   Symptom Management Administer all medications as prescribed Continue administering TPN as prescribed. Follow instructions for PICC flushing   Monitor for s/sx of central line infection and notify provider as recommended Contact provider or care management team with questions and new concerns as needed.               PLAN Pending call back from [facility] of Contact/Jerry [name] Will follow up within the next week   Katina Degree Health  [facility], [facility] Health RN Care Manager Direct Dial: [phone] Website: [email or web address]

## 2023-05-31 ENCOUNTER — Other Ambulatory Visit: Payer: Self-pay

## 2023-05-31 ENCOUNTER — Encounter: Payer: Self-pay | Admitting: Family Medicine

## 2023-05-31 DIAGNOSIS — G1221 Amyotrophic lateral sclerosis: Secondary | ICD-10-CM

## 2023-05-31 MED ORDER — SCOPOLAMINE 1 MG/3DAYS TD PT72: 1.0000 | MEDICATED_PATCH | TRANSDERMAL | 0 refills | Status: DC

## 2023-05-31 MED ORDER — GLYCOPYRROLATE 1 MG PO TABS: 1.0000 mg | ORAL_TABLET | Freq: Two times a day (BID) | ORAL | 0 refills | Status: DC | PRN

## 2023-05-31 MED ORDER — POLYETHYLENE GLYCOL 3350 17 GM/SCOOP PO POWD: 17.0000 g | Freq: Every day | ORAL | 0 refills | Status: DC

## 2023-06-03 ENCOUNTER — Other Ambulatory Visit: Payer: Self-pay

## 2023-06-03 NOTE — Patient Outreach (Unsigned)
  Care Management   Visit Note  06/03/2023 Name: Jerry [name] MRN: [id number] DOB: [birth year]  Subjective: Jerry [name] is a 52 y.o. year old male who is a primary care patient of [name], MD. The Care Management team was consulted for assistance.      Engaged with patient's spouse/caregiver.  [employer]-Sent diaper briefs and bed pad Purchasing barrier creams and wipes out of pocket Spoke with [name][employer] on Wednesday evening-completed first portion of reassessment Spending card has not been accessible for two months Dr. Ascensio-Nov 2 will f/u in December Minimal coverage from [facility] team this weekend [facility]/Dr. Lynden Ang merged-Palliative Care Reach out to [employer]/regarding feeding equivalent [employer]-transition to w/c [employer]/Pharmacist mixing TPN-[place]  Assessment:  Outpatient Encounter Medications as of 06/03/2023  Medication Sig   glycopyrrolate (ROBINUL) 1 MG tablet Place 1 tablet (1 mg total) into feeding tube 2 (two) times daily as needed.   ipratropium-albuterol (DUONEB) 0.5-2.5 (3) MG/3ML SOLN USE 3 ML VIA NEBULIZER EVERY 6 HOURS AS NEEDED FOR SHORTNESS OF BREATH   levofloxacin (LEVAQUIN) 750 MG tablet Place 1 tablet (750 mg total) into feeding tube daily. Crush for tube feeds. (Patient not taking: Reported on 04/05/2023)   polyethylene glycol powder (GLYCOLAX/MIRALAX) 17 GM/SCOOP powder Place 17 g into feeding tube daily. Titrate as needed for constipation   PRESCRIPTION MEDICATION Home TPN   scopolamine (TRANSDERM-SCOP) 1 MG/3DAYS Place 1 patch (1.5 mg total) onto the skin every 3 (three) days.   No facility-administered encounter medications on file as of 06/03/2023.

## 2023-06-05 ENCOUNTER — Other Ambulatory Visit: Payer: Self-pay

## 2023-06-05 ENCOUNTER — Encounter (HOSPITAL_COMMUNITY): Payer: Self-pay | Admitting: Emergency Medicine

## 2023-06-05 ENCOUNTER — Emergency Department (HOSPITAL_COMMUNITY)
Admission: EM | Admit: 2023-06-05 | Discharge: 2023-06-05 | Disposition: A | Discharge status: Home / Self Care | Payer: Medicare HMO | Attending: Emergency Medicine | Admitting: Emergency Medicine

## 2023-06-05 ENCOUNTER — Emergency Department (HOSPITAL_COMMUNITY): Payer: Medicare HMO

## 2023-06-05 DIAGNOSIS — R109 Unspecified abdominal pain: Secondary | ICD-10-CM | POA: Insufficient documentation

## 2023-06-05 DIAGNOSIS — I517 Cardiomegaly: Secondary | ICD-10-CM | POA: Diagnosis not present

## 2023-06-05 DIAGNOSIS — N200 Calculus of kidney: Secondary | ICD-10-CM | POA: Insufficient documentation

## 2023-06-05 DIAGNOSIS — G1221 Amyotrophic lateral sclerosis: Secondary | ICD-10-CM | POA: Insufficient documentation

## 2023-06-05 DIAGNOSIS — Z930 Tracheostomy status: Secondary | ICD-10-CM | POA: Diagnosis not present

## 2023-06-05 DIAGNOSIS — R0602 Shortness of breath: Secondary | ICD-10-CM | POA: Insufficient documentation

## 2023-06-05 DIAGNOSIS — Z931 Gastrostomy status: Secondary | ICD-10-CM | POA: Diagnosis not present

## 2023-06-05 LAB — COMPREHENSIVE METABOLIC PANEL
ALT: 79 U/L — ABNORMAL HIGH (ref 0–44)
AST: 50 U/L — ABNORMAL HIGH (ref 15–41)
Albumin: 3.9 g/dL (ref 3.5–5.0)
Alkaline Phosphatase: 218 U/L — ABNORMAL HIGH (ref 38–126)
Anion gap: 12 (ref 5–15)
BUN: 20 mg/dL (ref 6–20)
CO2: 22 mmol/L (ref 22–32)
Calcium: 9.7 mg/dL (ref 8.9–10.3)
Chloride: 101 mmol/L (ref 98–111)
Creatinine, Ser: <0.3 mg/dL — ABNORMAL LOW (ref 0.61–1.24)
Glucose, Bld: 163 mg/dL — ABNORMAL HIGH (ref 70–99)
Potassium: 3.2 mmol/L — ABNORMAL LOW (ref 3.5–5.1)
Sodium: 135 mmol/L (ref 135–145)
Total Bilirubin: 1.9 mg/dL — ABNORMAL HIGH (ref <1.2)
Total Protein: 8 g/dL (ref 6.5–8.1)

## 2023-06-05 LAB — CBC WITH DIFFERENTIAL/PLATELET
Abs Immature Granulocytes: 0.05 10*3/uL (ref 0.00–0.07)
Basophils Absolute: 0 10*3/uL (ref 0.0–0.1)
Basophils Relative: 0 %
Eosinophils Absolute: 0 10*3/uL (ref 0.0–0.5)
Eosinophils Relative: 0 %
HCT: 35.7 % — ABNORMAL LOW (ref 39.0–52.0)
Hemoglobin: 11.9 g/dL — ABNORMAL LOW (ref 13.0–17.0)
Immature Granulocytes: 0 %
Lymphocytes Relative: 11 %
Lymphs Abs: 1.3 10*3/uL (ref 0.7–4.0)
MCH: 27.1 pg (ref 26.0–34.0)
MCHC: 33.3 g/dL (ref 30.0–36.0)
MCV: 81.3 fL (ref 80.0–100.0)
Monocytes Absolute: 0.6 10*3/uL (ref 0.1–1.0)
Monocytes Relative: 6 %
Neutro Abs: 9.4 10*3/uL — ABNORMAL HIGH (ref 1.7–7.7)
Neutrophils Relative %: 83 %
Platelets: 161 10*3/uL (ref 150–400)
RBC: 4.39 MIL/uL (ref 4.22–5.81)
RDW: 16.4 % — ABNORMAL HIGH (ref 11.5–15.5)
WBC: 11.4 10*3/uL — ABNORMAL HIGH (ref 4.0–10.5)
nRBC: 0 % (ref 0.0–0.2)

## 2023-06-05 LAB — URINALYSIS, W/ REFLEX TO CULTURE (INFECTION SUSPECTED)
Bilirubin Urine: NEGATIVE
Glucose, UA: NEGATIVE mg/dL
Hgb urine dipstick: NEGATIVE
Ketones, ur: NEGATIVE mg/dL
Nitrite: NEGATIVE
Protein, ur: NEGATIVE mg/dL
Specific Gravity, Urine: 1.011 (ref 1.005–1.030)
pH: 9 — ABNORMAL HIGH (ref 5.0–8.0)

## 2023-06-05 LAB — LAB REPORT - SCANNED: EGFR: 139

## 2023-06-05 LAB — LIPASE, BLOOD: Lipase: 20 U/L (ref 11–51)

## 2023-06-05 MED ORDER — IOHEXOL 350 MG/ML SOLN
75.0000 mL | Freq: Once | INTRAVENOUS | Status: AC | PRN
  Administered 2023-06-05: 75 mL via INTRAVENOUS

## 2023-06-05 MED ORDER — HYDROCODONE-ACETAMINOPHEN 7.5-325 MG/15ML PO SOLN
10.0000 mL | Freq: Once | ORAL | Status: AC
  Administered 2023-06-05: 10 mL
  Filled 2023-06-05: qty 15

## 2023-06-05 MED ORDER — KETOROLAC TROMETHAMINE 30 MG/ML IJ SOLN
15.0000 mg | Freq: Once | INTRAMUSCULAR | Status: AC
  Administered 2023-06-05: 15 mg via INTRAVENOUS
  Filled 2023-06-05: qty 1

## 2023-06-05 MED ORDER — HYDROCODONE-ACETAMINOPHEN 7.5-325 MG/15ML PO SOLN: 15.0000 mL | Freq: Four times a day (QID) | ORAL | 0 refills | Status: DC | PRN

## 2023-06-05 MED ORDER — CEPHALEXIN 250 MG/5ML PO SUSR: 500.0000 mg | Freq: Three times a day (TID) | ORAL | 0 refills | Status: AC

## 2023-06-05 NOTE — ED Provider Notes (Signed)
[place] [facility]ERGENCY DEPARTMENT AT [facility] Provider Note   CSN: [id number] Arrival date & time: 06/05/23  0327     History  Chief Complaint  Patient presents with   Shortness of Breath   Flank Pain    Jerry [name] is a 52 y.o. male.  Patient presents to the emergency department for evaluation of abdominal and flank pain.  Patient is a history of ALS, has some limitation with conversation but interacts well with his wife.  She reports that he has been experiencing pain on the right side and into the back.  Wife reports a history of ileus, is concerned he might have a blockage.  He did have 1 episode of emesis.  Patient has been complaining of shortness of breath, possibly secondary to the pain.       Home Medications Prior to Admission medications   Medication Sig Start Date End Date Taking? Authorizing Provider  cephALEXin (KEFLEX) 250 MG/5ML suspension Take 10 mLs (500 mg total) by mouth 3 (three) times daily for 7 days. 06/05/23 06/12/23 Yes Kevaughn Ewing, Canary Brim, MD  HYDROcodone-acetaminophen (HYCET) 7.5-325 mg/15 ml solution Place 15 mLs into feeding tube 4 (four) times daily as needed for severe pain (pain score 7-10). 06/05/23  Yes Maybree Riling, Canary Brim, MD  glycopyrrolate (ROBINUL) 1 MG tablet Place 1 tablet (1 mg total) into feeding tube 2 (two) times daily as needed. 05/31/23   Sowles, Danna Hefty, MD  ipratropium-albuterol (DUONEB) 0.5-2.5 (3) MG/3ML SOLN USE 3 ML VIA NEBULIZER EVERY 6 HOURS AS NEEDED FOR SHORTNESS OF BREATH 12/09/22   Carlynn Purl, Danna Hefty, MD  levofloxacin (LEVAQUIN) 750 MG tablet Place 1 tablet (750 mg total) into feeding tube daily. Crush for tube feeds. Patient not taking: Reported on 04/05/2023 08/12/22   Eber Hong, MD  polyethylene glycol powder (GLYCOLAX/MIRALAX) 17 GM/SCOOP powder Place 17 g into feeding tube daily. Titrate as needed for constipation 05/31/23   [name], MD  PRESCRIPTION MEDICATION Home TPN    [provider]   scopolamine (TRANSDERM-SCOP) 1 MG/3DAYS Place 1 patch (1.5 mg total) onto the skin every 3 (three) days. 05/31/23   [name], MD      Allergies    Parke Simmers allergy]    Review of Systems   Review of Systems  Physical Exam Updated Vital Signs BP 123/79   Pulse 72   Temp 98.3 F (36.8 C) (Oral)   Resp 16   Ht 5\' 5"  (1.651 m)   Wt 65.8 kg   SpO2 100%   BMI 24.13 kg/m  Physical Exam Vitals and nursing note reviewed.  Constitutional:      General: He is not in acute distress.    Appearance: He is well-developed.  HENT:     Head: Normocephalic and atraumatic.     Mouth/Throat:     Mouth: Mucous membranes are moist.  Eyes:     General: Vision grossly intact. Gaze aligned appropriately.     Extraocular Movements: Extraocular movements intact.     Conjunctiva/sclera: Conjunctivae normal.  Neck:     Comments: Trach in place Cardiovascular:     Rate and Rhythm: Normal rate and regular rhythm.     Pulses: Normal pulses.     Heart sounds: Normal heart sounds, S1 normal and S2 normal. No murmur heard.    No friction rub. No gallop.  Pulmonary:     Effort: Pulmonary effort is normal. No respiratory distress.     Breath sounds: Normal breath sounds.  Abdominal:  Palpations: Abdomen is soft.     Tenderness: There is no abdominal tenderness. There is no guarding or rebound.     Hernia: No hernia is present.  Musculoskeletal:        General: No swelling.     Cervical back: Full passive range of motion without pain, normal range of motion and neck supple. No pain with movement, spinous process tenderness or muscular tenderness. Normal range of motion.     Right lower leg: No edema.     Left lower leg: No edema.  Skin:    General: Skin is warm and dry.     Capillary Refill: Capillary refill takes less than 2 seconds.     Findings: No ecchymosis, erythema, lesion or wound.  Neurological:     Mental Status: He is alert. Mental status is at baseline.     Motor:  Abnormal muscle tone present.     ED Results / Procedures / Treatments   Labs (all labs ordered are listed, but only abnormal results are displayed) Labs Reviewed  CBC WITH DIFFERENTIAL/PLATELET - Abnormal; Notable for the following components:      Result Value   WBC 11.4 (*)    Hemoglobin 11.9 (*)    HCT 35.7 (*)    RDW 16.4 (*)    Neutro Abs 9.4 (*)    All other components within normal limits  COMPREHENSIVE METABOLIC PANEL - Abnormal; Notable for the following components:   Potassium 3.2 (*)    Glucose, Bld 163 (*)    Creatinine, Ser <0.30 (*)    AST 50 (*)    ALT 79 (*)    Alkaline Phosphatase 218 (*)    Total Bilirubin 1.9 (*)    All other components within normal limits  URINALYSIS, W/ REFLEX TO CULTURE (INFECTION SUSPECTED) - Abnormal; Notable for the following components:   APPearance HAZY (*)    pH 9.0 (*)    Leukocytes,Ua MODERATE (*)    Bacteria, UA RARE (*)    All other components within normal limits  LIPASE, BLOOD    EKG EKG Interpretation Date/Time:  Sunday June 05 2023 03:49:00 EST Ventricular Rate:  83 PR Interval:  146 QRS Duration:  90 QT Interval:  378 QTC Calculation: 445 R Axis:   36  Text Interpretation: Sinus rhythm Left ventricular hypertrophy Inferior infarct, old No significant change since last tracing Confirmed by Gilda Crease [phone]) on 06/05/2023 3:49:57 AM  Radiology CT Angio Chest Pulmonary Embolism (PE) W or WO Contrast  Result Date: 06/05/2023 CLINICAL DATA:  Acute, nonlocalized abdominal pain. Pulmonary embolism suspected. History of ALS. EXAM: CT ANGIOGRAPHY CHEST CT ABDOMEN AND PELVIS WITH CONTRAST TECHNIQUE: Multidetector CT imaging of the chest was performed using the standard protocol during bolus administration of intravenous contrast. Multiplanar CT image reconstructions and MIPs were obtained to evaluate the vascular anatomy. Multidetector CT imaging of the abdomen and pelvis was performed using the standard  protocol during bolus administration of intravenous contrast. RADIATION DOSE REDUCTION: This exam was performed according to the departmental dose-optimization program which includes automated exposure control, adjustment of the mA and/or kV according to patient size and/or use of iterative reconstruction technique. CONTRAST:  75mL OMNIPAQUE IOHEXOL 350 MG/ML SOLN COMPARISON:  Abdominal CT 08/12/2022. FINDINGS: CTA CHEST FINDINGS Cardiovascular: Satisfactory opacification of the pulmonary arteries to the segmental level. No evidence of pulmonary embolism. Normal heart size. No pericardial effusion. Left PICC and right porta catheter with tips at the upper cavoatrial junction. Mediastinum/Nodes: No mass or  adenopathy. Lungs/Pleura: Located tracheostomy tube. Hazy airspace type density at the right base with some volume loss on coronal reformats. Scarring or atelectasis in the left lower lobe where there is band of opacity and volume loss at the posterior sulcus. Musculoskeletal: No acute finding. Review of the MIP images confirms the above findings. CT ABDOMEN and PELVIS FINDINGS Hepatobiliary: No focal liver abnormality.No evidence of biliary obstruction or stone. Pancreas: Unremarkable. Spleen: Unremarkable. Adrenals/Urinary Tract: Negative adrenals. Right hydronephrosis and delayed renal excretion due to 2 distal ureteral calculi measuring up to 3 mm at the right UVJ. The other stone is just more proximal and punctate. At least 4 right and 3 left renal calculi with stone or adjacent calculi at the right lower pole measuring up to 5 mm. Unremarkable bladder. Stomach/Bowel: Colonic fluid levels reaching the distal colon. Percutaneous gastrostomy tube which is located. No evidence of small-bowel obstruction. No bowel wall thickening. Vascular/Lymphatic: No acute vascular abnormality. No mass or adenopathy. Reproductive:No pathologic findings. Other: No ascites or pneumoperitoneum. Musculoskeletal: Diffuse muscular  atrophy correlating with patient's history. Review of the MIP images confirms the above findings. IMPRESSION: Abdominal CT: 1. Right hydronephrosis from 2 distal ureteral calculi measuring up to 2 mm at the UVJ. Multiple bilateral renal calculi. 2. Diffuse colonic fluid levels, correlate for diarrheal illness. Chest CT: 1. Mild airspace opacity at the right base, favor atelectasis over pneumonia. 2. Negative for pulmonary embolism. Electronically Signed   By: Tiburcio Pea M.D.   On: 06/05/2023 05:55   CT ABDOMEN PELVIS W CONTRAST  Result Date: 06/05/2023 CLINICAL DATA:  Acute, nonlocalized abdominal pain. Pulmonary embolism suspected. History of ALS. EXAM: CT ANGIOGRAPHY CHEST CT ABDOMEN AND PELVIS WITH CONTRAST TECHNIQUE: Multidetector CT imaging of the chest was performed using the standard protocol during bolus administration of intravenous contrast. Multiplanar CT image reconstructions and MIPs were obtained to evaluate the vascular anatomy. Multidetector CT imaging of the abdomen and pelvis was performed using the standard protocol during bolus administration of intravenous contrast. RADIATION DOSE REDUCTION: This exam was performed according to the departmental dose-optimization program which includes automated exposure control, adjustment of the mA and/or kV according to patient size and/or use of iterative reconstruction technique. CONTRAST:  75mL OMNIPAQUE IOHEXOL 350 MG/ML SOLN COMPARISON:  Abdominal CT 08/12/2022. FINDINGS: CTA CHEST FINDINGS Cardiovascular: Satisfactory opacification of the pulmonary arteries to the segmental level. No evidence of pulmonary embolism. Normal heart size. No pericardial effusion. Left PICC and right porta catheter with tips at the upper cavoatrial junction. Mediastinum/Nodes: No mass or adenopathy. Lungs/Pleura: Located tracheostomy tube. Hazy airspace type density at the right base with some volume loss on coronal reformats. Scarring or atelectasis in the left lower  lobe where there is band of opacity and volume loss at the posterior sulcus. Musculoskeletal: No acute finding. Review of the MIP images confirms the above findings. CT ABDOMEN and PELVIS FINDINGS Hepatobiliary: No focal liver abnormality.No evidence of biliary obstruction or stone. Pancreas: Unremarkable. Spleen: Unremarkable. Adrenals/Urinary Tract: Negative adrenals. Right hydronephrosis and delayed renal excretion due to 2 distal ureteral calculi measuring up to 3 mm at the right UVJ. The other stone is just more proximal and punctate. At least 4 right and 3 left renal calculi with stone or adjacent calculi at the right lower pole measuring up to 5 mm. Unremarkable bladder. Stomach/Bowel: Colonic fluid levels reaching the distal colon. Percutaneous gastrostomy tube which is located. No evidence of small-bowel obstruction. No bowel wall thickening. Vascular/Lymphatic: No acute vascular abnormality. No mass or adenopathy.  Reproductive:No pathologic findings. Other: No ascites or pneumoperitoneum. Musculoskeletal: Diffuse muscular atrophy correlating with patient's history. Review of the MIP images confirms the above findings. IMPRESSION: Abdominal CT: 1. Right hydronephrosis from 2 distal ureteral calculi measuring up to 2 mm at the UVJ. Multiple bilateral renal calculi. 2. Diffuse colonic fluid levels, correlate for diarrheal illness. Chest CT: 1. Mild airspace opacity at the right base, favor atelectasis over pneumonia. 2. Negative for pulmonary embolism. Electronically Signed   By: Tiburcio Pea M.D.   On: 06/05/2023 05:55   DG Chest Port 1 View  Result Date: 06/05/2023 CLINICAL DATA:  Shortness of breath and flank pain. EXAM: PORTABLE CHEST 1 VIEW COMPARISON:  December 22, 2022 FINDINGS: There is stable tracheostomy tube, right-sided venous Port-A-Cath and left-sided PICC line positioning. The heart size and mediastinal contours are within normal limits. Low lung volumes are noted with mild stable elevation  of the left hemidiaphragm. There is no evidence of an acute infiltrate, pleural effusion or pneumothorax. A percutaneous gastrostomy tube is suspected, overlying the medial aspect of the left upper quadrant. The visualized skeletal structures are unremarkable. IMPRESSION: Low lung volumes without acute or active cardiopulmonary disease. Electronically Signed   By: Aram Candela M.D.   On: 06/05/2023 04:22    Procedures Procedures    Medications Ordered in ED Medications  iohexol (OMNIPAQUE) 350 MG/ML injection 75 mL (75 mLs Intravenous Contrast Given 06/05/23 0546)    ED Course/ Medical Decision Making/ A&P                                 Medical Decision Making Amount and/or Complexity of Data Reviewed Labs: ordered. Radiology: ordered.  Risk Prescription drug management.   Patient with history of ALS, vent dependent.  He arrives on his home vent.  His wife is his caregiver provides information as she is able to converse with him.  Patient's main complaint is the right flank pain.  He seems to be breathing comfortably at arrival.  Lungs are clear.  Chest x-ray without acute abnormality.  Lab work was normal.  Urinalysis without obvious signs of infection.  This was obtained from his condom cath.  CT PE study performed because of his high risk for clotting and stated shortness of breath.  He is not hypoxic or tachypneic currently.  CT PE study is normal.  CT abdomen pelvis performed and does reveal a 2 mm distal ureteral stone which explains his right flank pain.        Final Clinical Impression(s) / ED Diagnoses Final diagnoses:  Kidney stone    Rx / DC Orders ED Discharge Orders          Ordered    cephALEXin (KEFLEX) 250 MG/5ML suspension  3 times daily        06/05/23 0614    HYDROcodone-acetaminophen (HYCET) 7.5-325 mg/15 ml solution  4 times daily PRN        06/05/23 0614              Gilda Crease, MD 06/05/23 [phone]

## 2023-06-05 NOTE — ED Notes (Signed)
[facility] called for transport

## 2023-06-05 NOTE — Discharge Instructions (Signed)
Return to the emergency department for uncontrolled pain or fever.

## 2023-06-05 NOTE — ED Triage Notes (Signed)
Pt to ED via [facility] from home c/o SOB and right flank pain.  Flank pain worsened tonight and radiated to abd and back.  Wife also noticed abd distention tonight.  Pt has hx of ileus.  Has had 3-4 BMs today which is increased for patient.  Pt is nonverbal and has trach.  Wife at bedside.

## 2023-06-06 ENCOUNTER — Other Ambulatory Visit: Payer: Self-pay

## 2023-06-06 NOTE — Patient Outreach (Unsigned)
  Care Management   Visit Note  06/06/2023 Name: Jerry [name] MRN: [id number] DOB: 05/03/1971  Subjective: Jerry [name] is a 52 y.o. year old male who is a primary care patient of [name], MD. The Care Management team was consulted for assistance.      Engaged with patient's spouse/caregiver via telephone today.  Assessment:  Outpatient Encounter Medications as of 06/06/2023  Medication Sig   cephALEXin (KEFLEX) 250 MG/5ML suspension Take 10 mLs (500 mg total) by mouth 3 (three) times daily for 7 days.   glycopyrrolate (ROBINUL) 1 MG tablet Place 1 tablet (1 mg total) into feeding tube 2 (two) times daily as needed.   HYDROcodone-acetaminophen (HYCET) 7.5-325 mg/15 ml solution Place 15 mLs into feeding tube 4 (four) times daily as needed for severe pain (pain score 7-10).   ipratropium-albuterol (DUONEB) 0.5-2.5 (3) MG/3ML SOLN USE 3 ML VIA NEBULIZER EVERY 6 HOURS AS NEEDED FOR SHORTNESS OF BREATH   polyethylene glycol powder (GLYCOLAX/MIRALAX) 17 GM/SCOOP powder Place 17 g into feeding tube daily. Titrate as needed for constipation   scopolamine (TRANSDERM-SCOP) 1 MG/3DAYS Place 1 patch (1.5 mg total) onto the skin every 3 (three) days.   levofloxacin (LEVAQUIN) 750 MG tablet Place 1 tablet (750 mg total) into feeding tube daily. Crush for tube feeds. (Patient not taking: Reported on 04/05/2023)   PRESCRIPTION MEDICATION Home TPN   No facility-administered encounter medications on file as of 06/06/2023.

## 2023-06-07 ENCOUNTER — Other Ambulatory Visit: Payer: Self-pay

## 2023-06-08 NOTE — Patient Outreach (Signed)
Care Management   Visit Note   Name: Desi Stacey MRN: [id number] DOB: [birth year]  Subjective: Granvel Poch is a 52 y.o. year old male who is a primary care patient of [name], MD. The Care Management team was consulted for assistance.      Engaged with Mr. Knobel spouse via telephone.  Assessment:  Outpatient Encounter Medications as of 06/07/2023  Medication Sig   cephALEXin (KEFLEX) 250 MG/5ML suspension Take 10 mLs (500 mg total) by mouth 3 (three) times daily for 7 days.   glycopyrrolate (ROBINUL) 1 MG tablet Place 1 tablet (1 mg total) into feeding tube 2 (two) times daily as needed.   HYDROcodone-acetaminophen (HYCET) 7.5-325 mg/15 ml solution Place 15 mLs into feeding tube 4 (four) times daily as needed for severe pain (pain score 7-10).   ipratropium-albuterol (DUONEB) 0.5-2.5 (3) MG/3ML SOLN USE 3 ML VIA NEBULIZER EVERY 6 HOURS AS NEEDED FOR SHORTNESS OF BREATH   levofloxacin (LEVAQUIN) 750 MG tablet Place 1 tablet (750 mg total) into feeding tube daily. Crush for tube feeds. (Patient not taking: Reported on 04/05/2023)   polyethylene glycol powder (GLYCOLAX/MIRALAX) 17 GM/SCOOP powder Place 17 g into feeding tube daily. Titrate as needed for constipation   PRESCRIPTION MEDICATION Home TPN   scopolamine (TRANSDERM-SCOP) 1 MG/3DAYS Place 1 patch (1.5 mg total) onto the skin every 3 (three) days.   No facility-administered encounter medications on file as of 06/07/2023.    Interventions:  Goals Addressed             This Visit's Progress    Care Management       Current Barriers:  Care Management support and education needs related to ALS Transportation barriers  Interventions:ALS Discussed treatment plan related to management of pain r/t Kidney stone. [name] was transported to the Emergency Room on 06/05/23 d/t flank pain, abdominal pain as well as intermittent shortness of breath and emesis. CT was performed in the ER and revealed a 2mm distal ureteral  stone. Reviewed medications and discharge plan. Reports administering Keflex three times a day as prescribed. Reports in-home nurses are currently administering at 2 am, 10 am and 6 pm. Advised to ensure he completes the full 7-day course.  Hydrocodone-acetaminophen/Hycet solution was ordered for pain. Spouse is aware that this medication can be administered every 6 hours as needed. Reports Mr. Robtoy complained of 5/10 pain a few hours ago. Hycet was administered. Reports he is currently resting comfortably.  Discussed plan for transportation. She reports speaking with his assigned [employer] POC, [name]. She is pending a return call regarding options. [name] is also receiving Medicaid benefits. We will schedule a conference call for 06/07/23 to discuss options with his assigned Medicaid case worker. Update 06/07/23: Completed conference call with Mr. Dambrosio spouse/caregiver and [name] case worker [name]. [name] indicated that Mr. [place]'s current Medicaid coverage does not provide transportation that can support stretcher transport. She provided contact information for a local agency, [name] that may be able to assist. Discussed plan for Medicaid coverage in 2025. [name] confirmed that Mr. Cer[place]'s coverage will carry over, however the actual plan will change on July 06, 2023. Reports his case worker will also change on that date. Provided contact info for the pending case worker: [name].  Discussed plan for needed evaluations with Pulmonology and Gastroenterology. RNCM will contact both clinics to schedule and collaborate with PCP if new referrals are required. Mrs. Vanderhoek agrees with this plan. Will follow up within the next week.  Symptom Management Administer all medications as prescribed Continue administering TPN as prescribed. Follow instructions for PICC flushing  Monitor for s/sx of central line infection and notify provider as recommended Contact provider or care  management team with questions and new concerns as needed.              PLAN Will follow up within the next week   Katina [facility]  [facility], [facility] RN Care Manager Direct Dial: [phone] Website: [email or web address]

## 2023-06-09 ENCOUNTER — Other Ambulatory Visit: Payer: Self-pay

## 2023-06-09 ENCOUNTER — Telehealth: Payer: Self-pay | Admitting: Family Medicine

## 2023-06-09 DIAGNOSIS — Z930 Tracheostomy status: Secondary | ICD-10-CM

## 2023-06-09 DIAGNOSIS — G1221 Amyotrophic lateral sclerosis: Secondary | ICD-10-CM

## 2023-06-09 NOTE — Telephone Encounter (Signed)
Aundra Millet called to advise that patient missed his 2am dose of cephALEXin (KEFLEX) 250 MG/5ML suspension but it was administered at 7am and it will be given at 3pm and 11pm. If there are any questions please reach out to [facility]

## 2023-06-09 NOTE — Patient Outreach (Signed)
  Care Management   Visit Note  06/09/2023 Name: Jerry [name] MRN: [id number] DOB: 05/12/1971  Subjective: Jerry [name] is a 52 y.o. year old male who is a primary care patient of [name], MD. The Care Management team was consulted for assistance.      Care Coordination:   Goals Addressed             This Visit's Progress    Care Management       Current Barriers:  Care Management support and education needs related to ALS Transportation barriers  Interventions:ALS Discussed treatment plan related to management of pain r/t Kidney stone. [name] was transported to the Emergency Room on 06/05/23 d/t flank pain, abdominal pain as well as intermittent shortness of breath and emesis. CT was performed in the ER and revealed a 2mm distal ureteral stone. Reviewed medications and discharge plan. Reports administering Keflex three times a day as prescribed. Reports in-home nurses are currently administering at 2 am, 10 am and 6 pm. Advised to ensure he completes the full 7-day course.  Hydrocodone-acetaminophen/Hycet solution was ordered for pain. Spouse is aware that this medication can be administered every 6 hours as needed. Reports [name] complained of 5/10 pain a few hours ago. Hycet was administered. Reports he is currently resting comfortably.  Discussed plan for transportation. She reports speaking with his assigned [employer] POC, [name]. She is pending a return call regarding options. [name] is also receiving Medicaid benefits. We will schedule a conference call for 06/07/23 to discuss options with his assigned Medicaid case worker. Update 06/07/23: Completed conference call with Mr. Urwin spouse/caregiver and [name] case worker [name]. [name] indicated that [name]'s current Medicaid coverage does not provide transportation that can support stretcher transport. She provided contact information for a local agency, [name] that may be able to  assist. Discussed plan for Medicaid coverage in 2025. [name] confirmed that [name]'s coverage will carry over, however the actual plan will change on July 06, 2023. Reports his case worker will also change on that date. Provided contact info for the pending case worker: [name].  Discussed plan for needed evaluations with Pulmonology and Gastroenterology. RNCM will contact both clinics to schedule and collaborate with PCP if new referrals are required. [name] agrees with this plan. Will follow up within the next week. Update 06/09/23: Collaborated with the [facility] Pulmonology team. A referral was previously placed for evaluation. The appointment was not scheduled due to lack of stretcher transport. The pulmonology team will confirm if [name] can be evaluated in the clinic or if he will need to arrange evaluation with the inpatient pulmonary providers d/t having a tracheostomy. Pending follow up call from clinic [name] will require a new referral prior to being evaluated by the GI team. Request forwarded to PCP  Symptom Management Administer all medications as prescribed Continue administering TPN as prescribed. Follow instructions for PICC flushing  Monitor for s/sx of central line infection and notify provider as recommended Contact provider or care management team with questions and new concerns as needed.               PLAN Pending follow up call with [facility] Pulmonology. Will follow up with Mr. Than and his spouse/caregiver within the next week   Katina [facility]  [facility], [facility] RN Care Manager Direct Dial: [phone] Website: [email or web address]

## 2023-06-10 ENCOUNTER — Other Ambulatory Visit: Payer: Medicare HMO

## 2023-06-11 LAB — LAB REPORT - SCANNED: EGFR: 138

## 2023-06-12 NOTE — Patient Outreach (Signed)
Care Management   Visit Note   Name: Jerry [name] MRN: [id number] DOB: 03/30/1971  Subjective: Jerry [name] is a 52 y.o. year old male who is a primary care patient of Jerry Cory, MD. The Care Management team was consulted for assistance.      Message received from Jerry [name]'s spouse/caregiver. Collaborated with Jerry [name] regarding plan for in-home nursing coverage  Assessment:  Review of patient past medical history, allergies, medications, health status, including review of consultants reports, laboratory and other test data, was performed as part of  evaluation and provision of care management services.    Outpatient Encounter Medications as of 06/10/2023  Medication Sig   cephALEXin (KEFLEX) 250 MG/5ML suspension Take 10 mLs (500 mg total) by mouth 3 (three) times daily for 7 days.   glycopyrrolate (ROBINUL) 1 MG tablet Place 1 tablet (1 mg total) into feeding tube 2 (two) times daily as needed.   HYDROcodone-acetaminophen (HYCET) 7.5-325 mg/15 ml solution Place 15 mLs into feeding tube 4 (four) times daily as needed for severe pain (pain score 7-10).   ipratropium-albuterol (DUONEB) 0.5-2.5 (3) MG/3ML SOLN USE 3 ML VIA NEBULIZER EVERY 6 HOURS AS NEEDED FOR SHORTNESS OF BREATH   levofloxacin (LEVAQUIN) 750 MG tablet Place 1 tablet (750 mg total) into feeding tube daily. Crush for tube feeds. (Patient not taking: Reported on 04/05/2023)   polyethylene glycol powder (GLYCOLAX/MIRALAX) 17 GM/SCOOP powder Place 17 g into feeding tube daily. Titrate as needed for constipation   PRESCRIPTION MEDICATION Home TPN   scopolamine (TRANSDERM-SCOP) 1 MG/3DAYS Place 1 patch (1.5 mg total) onto the skin every 3 (three) days.   No facility-administered encounter medications on file as of 06/10/2023.      Interventions  Goals Addressed             This Visit's Progress    Care Management       Current Barriers:  Care Management support and education needs related to ALS Transportation  barriers  Interventions:ALS Discussed treatment plan related to management of pain r/t Kidney stone. Jerry [name] was transported to the Emergency Room on 06/05/23 d/t flank pain, abdominal pain as well as intermittent shortness of breath and emesis. CT was performed in the ER and revealed a 2mm distal ureteral stone. Reviewed medications and discharge plan. Reports administering Keflex three times a day as prescribed. Reports in-home nurses are currently administering at 2 am, 10 am and 6 pm. Advised to ensure he completes the full 7-day course.  Hydrocodone-acetaminophen/Hycet solution was ordered for pain. Spouse is aware that this medication can be administered every 6 hours as needed. Reports Jerry [name] complained of 5/10 pain a few hours ago. Hycet was administered. Reports he is currently resting comfortably.  Discussed plan for transportation. She reports speaking with his assigned [employer] POC, Raynelle [name]. She is pending a return call regarding options. Jerry [name] is also receiving Medicaid benefits. We will schedule a conference call for 06/07/23 to discuss options with his assigned Medicaid case worker. Update 06/07/23: Completed conference call with Jerry [name] spouse/caregiver and [name] case worker Jerry [name]. Jerry [name] indicated that Jerry [name]'s current Medicaid coverage does not provide transportation that can support stretcher transport. She provided contact information for a local agency, Jerry [name] that may be able to assist. Discussed plan for Medicaid coverage in 2025. Jerry [name] confirmed that Jerry [name]'s coverage will carry over, however the actual plan will change on July 06, 2023. Reports his case worker will also change on that date. Provided contact info for  the pending case worker: Jerry [name].  Discussed plan for needed evaluations with Pulmonology and Gastroenterology. RNCM will contact both clinics to schedule and collaborate with Jerry [name] if new referrals are required. Jerry [name]  agrees with this plan. Will follow up within the next week. Update 06/09/23 Collaborated with the Jerry [name] Pulmonology team. A referral was previously placed for evaluation. The appointment was not scheduled due to lack of stretcher transport. The pulmonology team will confirm if Jerry [name] can be evaluated in the clinic or if he will need to arrange evaluation with the inpatient pulmonary providers d/t having a tracheostomy. Pending follow up call from clinic Jerry [name] will require a new referral prior to being evaluated by the GI team. Request forwarded to Jerry [name]. Update 06/10/23: Message received from Jerry [name]. Expressed concerns that Jerry [name] may not have enough in-home nursing coverage d/t home health staff call outs and planned vacation. Requesting outreach with Jerry [name] team to determine if shift will be covered or if another agency is available to ensure adequate coverage. Collaborated with Jerry [name]/Jerry [name] coordination. Confirmed that [name] is still eligible for 112 hr/week of in home nursing care. Notes due to Jerry [name] requiring TPN any nurse providing care with his case must complete TPN training however this is not a requirement for all nurses employed with the agency. He is also ventilatory dependent. Reports due to the complexity of his case, other nurses within the agency may not be qualified to fill in when members of his care team unexpectedly call out.  Reports Jerry [name] is currently in alliance with three other agencies, Jerry [name], Jerry [name] and Jerry [name] that may be available to share the hours if staffing permits, however Jerry [name] has been able to cover 111 of the 112 hr/week. She agreed to follow up with Jerry [name] to discuss and ensure the agency is aware of incidents when staff contact the family for call-outs versus contacting the agency. RNCM will follow up next week.   Symptom Management Administer all medications as prescribed Continue administering TPN as prescribed. Follow instructions for Jerry [name]  flushing  Monitor for s/sx of central line infection and notify provider as recommended Contact provider or care management team with questions and new concerns as needed.                PLAN Will follow up next week.   Katina Degree [facility]  [facility], [facility] RN Care Manager Direct Dial: [phone] Website: [email or web address]

## 2023-06-14 ENCOUNTER — Encounter: Payer: Self-pay | Admitting: Family Medicine

## 2023-06-14 ENCOUNTER — Other Ambulatory Visit: Payer: Self-pay | Admitting: Family Medicine

## 2023-06-14 ENCOUNTER — Other Ambulatory Visit: Payer: Self-pay

## 2023-06-14 DIAGNOSIS — J9611 Chronic respiratory failure with hypoxia: Secondary | ICD-10-CM

## 2023-06-14 DIAGNOSIS — J9612 Chronic respiratory failure with hypercapnia: Secondary | ICD-10-CM

## 2023-06-14 DIAGNOSIS — K560 Paralytic ileus: Secondary | ICD-10-CM | POA: Diagnosis not present

## 2023-06-14 DIAGNOSIS — G1221 Amyotrophic lateral sclerosis: Secondary | ICD-10-CM

## 2023-06-14 DIAGNOSIS — G8250 Quadriplegia, unspecified: Secondary | ICD-10-CM | POA: Diagnosis not present

## 2023-06-14 DIAGNOSIS — D638 Anemia in other chronic diseases classified elsewhere: Secondary | ICD-10-CM

## 2023-06-14 DIAGNOSIS — F419 Anxiety disorder, unspecified: Secondary | ICD-10-CM

## 2023-06-14 DIAGNOSIS — E46 Unspecified protein-calorie malnutrition: Secondary | ICD-10-CM

## 2023-06-14 DIAGNOSIS — E876 Hypokalemia: Secondary | ICD-10-CM

## 2023-06-14 DIAGNOSIS — Z452 Encounter for adjustment and management of vascular access device: Secondary | ICD-10-CM | POA: Diagnosis not present

## 2023-06-14 DIAGNOSIS — Z9911 Dependence on respirator [ventilator] status: Secondary | ICD-10-CM

## 2023-06-14 DIAGNOSIS — R1310 Dysphagia, unspecified: Secondary | ICD-10-CM

## 2023-06-14 DIAGNOSIS — Z930 Tracheostomy status: Secondary | ICD-10-CM

## 2023-06-14 NOTE — Patient Outreach (Unsigned)
  Care Management   Visit Note  06/14/2023 Name: Jerry [name] MRN: [id number] DOB: 23-Aug-1970  Subjective: Jerry [name] is a 52 y.o. year old male who is a primary care patient of [name], MD. The Care Management team was consulted for assistance.      Engaged with patient's spouse/caregiver via telephone.  Assessment:  Review of patient past medical history, allergies, medications, health status, including review of consultants reports, laboratory and other test data, was performed as part of  evaluation and provision of care management services.    Outpatient Encounter Medications as of 06/14/2023  Medication Sig   glycopyrrolate (ROBINUL) 1 MG tablet Place 1 tablet (1 mg total) into feeding tube 2 (two) times daily as needed.   HYDROcodone-acetaminophen (HYCET) 7.5-325 mg/15 ml solution Place 15 mLs into feeding tube 4 (four) times daily as needed for severe pain (pain score 7-10).   ipratropium-albuterol (DUONEB) 0.5-2.5 (3) MG/3ML SOLN USE 3 ML VIA NEBULIZER EVERY 6 HOURS AS NEEDED FOR SHORTNESS OF BREATH   levofloxacin (LEVAQUIN) 750 MG tablet Place 1 tablet (750 mg total) into feeding tube daily. Crush for tube feeds. (Patient not taking: Reported on 04/05/2023)   polyethylene glycol powder (GLYCOLAX/MIRALAX) 17 GM/SCOOP powder Place 17 g into feeding tube daily. Titrate as needed for constipation   PRESCRIPTION MEDICATION Home TPN   scopolamine (TRANSDERM-SCOP) 1 MG/3DAYS Place 1 patch (1.5 mg total) onto the skin every 3 (three) days.   No facility-administered encounter medications on file as of 06/14/2023.    Interventions:    Plan:

## 2023-06-15 ENCOUNTER — Other Ambulatory Visit: Payer: Self-pay

## 2023-06-15 ENCOUNTER — Ambulatory Visit: Payer: Self-pay | Admitting: *Deleted

## 2023-06-15 ENCOUNTER — Encounter (HOSPITAL_COMMUNITY): Payer: Self-pay

## 2023-06-15 ENCOUNTER — Emergency Department (HOSPITAL_COMMUNITY): Payer: Medicare HMO

## 2023-06-15 ENCOUNTER — Emergency Department (HOSPITAL_COMMUNITY)
Admission: EM | Admit: 2023-06-15 | Discharge: 2023-06-15 | Disposition: A | Discharge status: Home / Self Care | Payer: Medicare HMO | Attending: Emergency Medicine | Admitting: Emergency Medicine

## 2023-06-15 DIAGNOSIS — K9420 Gastrostomy complication, unspecified: Secondary | ICD-10-CM

## 2023-06-15 DIAGNOSIS — K9429 Other complications of gastrostomy: Secondary | ICD-10-CM | POA: Diagnosis present

## 2023-06-15 MED ORDER — DIATRIZOATE MEGLUMINE & SODIUM 66-10 % PO SOLN
30.0000 mL | Freq: Once | ORAL | Status: AC
  Administered 2023-06-15: 30 mL
  Filled 2023-06-15: qty 30

## 2023-06-15 NOTE — Patient Outreach (Signed)
  Care Management   Visit Note  06/15/2023 Name: Jerry [name] MRN: [id number] DOB: February 20, 1971  Subjective: Jerry [name] is a 52 y.o. year old male who is a primary care patient of [name], MD. The Care Management team was consulted for assistance.      Care Coordination:  Collaborated with primary care provider regarding need for new Pulmonology referral. Referral was placed on 06/14/23. [name] will be followed by [facility]. RNCM will contact the [facility] team today to arrange initial evaluation.   PLAN Will follow up with [name] and his spouse/caregiver to assist with arranging transportation.   Katina Degree [facility]  [facility], [facility] RN Care Manager Direct Dial: [phone] Website: [email or web address]

## 2023-06-15 NOTE — Patient Outreach (Signed)
  Care Management   Visit Note  06/15/2023 Name: Jerry [name] MRN: [id number] DOB: 1971-04-10  Subjective: Jerry [name] is a 52 y.o. year old male who is a primary care patient of [name], MD. The Care Management team was consulted for assistance.      Call received from Mr. Coey's spouse/caregiver regarding dislodged g-tube.   Goals Addressed             This Visit's Progress    G-Tube Dislodged       Current Barriers:  Care Management support and education needs related to Compromised G-Tube  Planned Interventions: Call received from Mr. Breslin's spouse/caregiver informing RNCM of his dislodged g-tube. Reports the home nurse was at the bedside at the time of the incident. Notes home nurse contacted triage. Reports minimal drainage and scant bleeding to the insertion site. Confirmed the site has been covered  with sterile gauze.  Confirmed Mr. Grahmann is currently in not acute distress and following advise to remain calm until paramedics arrive. Denies notable alarms with his ventilator. 911 has been notified to transport Mr. Green to the hospital for further evaluation.   Follow Up Plan:  Follow up pending hospital evaluation           PLAN Will update PCP Follow up pending hospital evaluation   Katina [facility]  [facility], [facility] RN Care Manager Direct Dial: [phone] Website: [email or web address]

## 2023-06-15 NOTE — Care Management (Addendum)
Transition of Care [facility]) - Emergency Department Mini Assessment   Patient Details  Name: Jerry [name] MRN: [id number] Date of Birth: 1970-07-11  [facility] CM/SW Contact:    Lavenia Atlas, RN Phone Number: 06/15/2023, 10:01 PM   Clinical Narrative: Received call from [facility] RN who reports patient's wife is requesting assistance with obtaining transportation to patient's GI appointment. Per chart review patient has ALS, vent dependent, bedbound, uses video assisted device for communication. Patient will require transportation with a stretcher as he is unable to transfer in wheelchair. Patient is being discharged however wife is requesting a call tomorrow re: potential transportation d/t she has been trying for 1 year to get transportation. This RNCM spoke with [facility] who reports they do not provide vent dependent outpatient transportation. This RNCM will notify day shift RNCM to follow up with wife: Jerry [name] [phone]..  This RNCM spoke with patient's wife Jerry [name] [phone] re: vent dependent stretcher outpatient transportation. [name] reports patient has GI & pulmonologist appointment scheduled since last November and she is unable to locate transportation. [name] reports working with the patient's case managers Raynaldo Opitz however no success. [name] reports case manager Daine Floras w/ ALS clinic is assigned however patient has been getting his care at [facility]. Due to after hours this RNCM unable to confirm any transportation. Sent day shift RNCM a message to follow up with any available resources.      TOC following.     ED Mini Assessment: What brought you to the Emergency Department? : displaced G-tube  Barriers to Discharge: Continued Medical Work up  [employer] interventions: attempt to assist with transportation needs  Means of departure: Ambulance  Interventions which prevented an admission or readmission: Transportation Screening    Patient  Contact and Communications        ,                 Admission diagnosis:  gtube is out Patient Active Problem List   Diagnosis Date Noted   Chronic respiratory failure with hypoxia (HCC) 06/22/2022   Ileus (HCC) 05/19/2022   Autonomic dysfunction 01/06/2022   Ventilator dependent (HCC) 01/06/2022   Tracheostomy dependent (HCC) 01/06/2022   Pressure injury of skin 12/27/2021   Anxiety 11/18/2021   Bed sore on buttock, right, unstageable (HCC) 11/18/2021   Severe protein-calorie malnutrition (HCC) 11/13/2021   Microscopic hematuria 10/29/2021   S/P percutaneous endoscopic gastrostomy (PEG) tube placement (HCC) 10/29/2021   ALS (amyotrophic lateral sclerosis) (HCC) 01/11/2019   PCP:  [name], MD Pharmacy:   [id number] [place], [place] - [address] AT [facility] [address] [place] [address] Phone: [phone] Fax: [phone]

## 2023-06-15 NOTE — Telephone Encounter (Signed)
  Chief Complaint: [facility] nurse from [facility] called in to report EMS has been called to take pt to the ED.    His G tube got pulled out. Symptoms: G tube is out. Frequency: Now Pertinent Negatives: Patient denies N/A Disposition: [x] ED /[] Urgent Care (no appt availability in office) / [] Appointment(In office/virtual)/ []  [place] [facility]/ [] Home Care/ [] Refused Recommended Disposition /[] [place] [facility]/ []  Follow-up with PCP Additional Notes: Message sent to Dr Carlynn Purl

## 2023-06-15 NOTE — ED Notes (Signed)
X-ray at bedside

## 2023-06-15 NOTE — ED Triage Notes (Signed)
Pt coming in from home with home vent hooked up. Pt is alert and oriented functioning at baseline. Pt communicates using computer screen. Pt family reports that g tube came out this morning . The pts wife cleaned it off and put it back but was unable to confirm placement with auscultation. Pt is here with home health nurse and wife.

## 2023-06-15 NOTE — Telephone Encounter (Signed)
His G Tube was pulled out.    EMS on the way to take him to the ED now. Reason for Disposition  Sounds like a life-threatening emergency to the triager    G tube pulled out.   EMS on the way to take him to the ED.   Home Health nurse calling in the report.  Answer Assessment - Initial Assessment Questions 1. LOCATION: "Where does it hurt?"      G tube is pulled out.   [facility] nurse with [facility] called in to report it.   EMS on the way there now to take him to the ED. 2. RADIATION: "Does the pain shoot anywhere else?" (e.g., chest, back)      3. ONSET: "When did the pain begin?" (e.g., minutes, hours or days ago)       4. SUDDEN: "Gradual or sudden onset?"      5. PATTERN "Does the pain come and go, or is it constant?"    - If it comes and goes: "How long does it last?" "Do you have pain now?"     (Note: Comes and goes means the pain is intermittent. It goes away completely between bouts.)    - If constant: "Is it getting better, staying the same, or getting worse?"      (Note: Constant means the pain never goes away completely; most serious pain is constant and gets worse.)       6. SEVERITY: "How bad is the pain?"  (e.g., Scale 1-10; mild, moderate, or severe)    - MILD (1-3): Doesn't interfere with normal activities, abdomen soft and not tender to touch..     - MODERATE (4-7): Interferes with normal activities or awakens from sleep, abdomen tender to touch.     - SEVERE (8-10): Excruciating pain, doubled over, unable to do any normal activities.        7. RECURRENT SYMPTOM: "Have you ever had this type of stomach pain before?" If Yes, ask: "When was the last time?" and "What happened that time?"       8. AGGRAVATING FACTORS: "Does anything seem to cause this pain?" (e.g., foods, stress, alcohol)      9. CARDIAC SYMPTOMS: "Do you have any of the following symptoms: chest pain, difficulty breathing, sweating, nausea?"      10. OTHER SYMPTOMS: "Do you have any other  symptoms?" (e.g., back pain, diarrhea, fever, urination pain, vomiting)        11. PREGNANCY: "Is there any chance you are pregnant?" "When was your last menstrual period?"  Protocols used: Abdominal Pain - Upper-A-AH

## 2023-06-15 NOTE — ED Notes (Signed)
45F G-tube has now been located from IR and placed at the bedside. Dr. Earlene Plater aware.

## 2023-06-15 NOTE — ED Provider Notes (Signed)
[place] EMERGENCY DEPARTMENT AT [facility] Provider Note   CSN: [id number] Arrival date & time: 06/15/23  1406     History  Chief Complaint  Patient presents with   g tube problem     [name] is a 52 y.o. male.  HPI Patient presents with his wife and a caregiver who provide the history.  Patient has ALS, is bedbound, communicates with video-assisted device.  He is tracheostomy dependent, PEG tube dependent, also received TPN with plan for transition to oral TPN in the near future.  Patient's PEG tube was found to be malfunctioning today, with leakage around the device.  The inflation bulb was found to not be holding air and he was presenting for evaluation.    Home Medications Prior to Admission medications   Medication Sig Start Date End Date Taking? Authorizing Provider  glycopyrrolate (ROBINUL) 1 MG tablet Place 1 tablet (1 mg total) into feeding tube 2 (two) times daily as needed. 05/31/23   [name], MD  HYDROcodone-acetaminophen (HYCET) 7.5-325 mg/15 ml solution Place 15 mLs into feeding tube 4 (four) times daily as needed for severe pain (pain score 7-10). 06/05/23   Gilda Crease, MD  ipratropium-albuterol (DUONEB) 0.5-2.5 (3) MG/3ML SOLN USE 3 ML VIA NEBULIZER EVERY 6 HOURS AS NEEDED FOR SHORTNESS OF BREATH 12/09/22   [name], MD  levofloxacin (LEVAQUIN) 750 MG tablet Place 1 tablet (750 mg total) into feeding tube daily. Crush for tube feeds. Patient not taking: Reported on 04/05/2023 08/12/22   Eber Hong, MD  polyethylene glycol powder (GLYCOLAX/MIRALAX) 17 GM/SCOOP powder Place 17 g into feeding tube daily. Titrate as needed for constipation 05/31/23   [name], MD  PRESCRIPTION MEDICATION Home TPN    [provider]  scopolamine (TRANSDERM-SCOP) 1 MG/3DAYS Place 1 patch (1.5 mg total) onto the skin every 3 (three) days. 05/31/23   [name], MD      Allergies    Parke Simmers allergy]    Review of  Systems   Review of Systems  Physical Exam Updated Vital Signs BP (!) 130/90   Pulse 75   Temp (!) 97.1 F (36.2 C) (Axillary)   Resp 20   Ht 5\' 5"  (1.651 m)   Wt 65.8 kg   SpO2 100%   BMI 24.13 kg/m  Physical Exam Vitals and nursing note reviewed.  Constitutional:      Appearance: He is well-developed. He is ill-appearing.  HENT:     Head: Normocephalic and atraumatic.  Eyes:     Conjunctiva/sclera: Conjunctivae normal.  Neck:     Comments: Tracheostomy present Cardiovascular:     Rate and Rhythm: Normal rate and regular rhythm.  Pulmonary:     Effort: Pulmonary effort is normal. No respiratory distress.     Breath sounds: No stridor.  Abdominal:     General: There is distension.     Comments: Left upper quadrant G-tube stoma clean, dry, no purulence  Genitourinary:    Comments: Condom cath draining urine Skin:    General: Skin is warm and dry.  Neurological:     Mental Status: He is alert.     Comments: Stigmata of ALS grossly evident, bedbound, does not move extremities, no speech, does offer some facial expressions for response     ED Results / Procedures / Treatments   Labs (all labs ordered are listed, but only abnormal results are displayed) Labs Reviewed - No data to display  EKG None  Radiology No results found.  Procedures Procedures    Medications Ordered in ED Medications - No data to display  ED Course/ Medical Decision Making/ A&P Clinical Course as of 06/15/23 1620  Wed Jun 15, 2023  1550 Hx ALS, G tube not working, Geographical information systems officer, trying to find right sized tube, getting XR for some distension, needs tube replaced [JD]    Clinical Course User Index [JD] Laurence Spates, MD                                 Medical Decision Making Adult male presents in his usual state of health, which is notable for ALS.  Patient has no evidence for acute new phenomenon, but does have dysfunctional feeding tube requiring replacement.  Tube is not  immediately available, and efforts to obtain this are being executed patient will require replacement.  Patient does have some waxing, waning abdominal protuberance, which is not unusual for him according to family, and x-ray will be performed for consideration of ileus versus obstruction as well.  Amount and/or Complexity of Data Reviewed Independent Historian: spouse External Data Reviewed: notes. Radiology: ordered.  Risk Decision regarding hospitalization. Diagnosis or treatment significantly limited by social determinants of health.   Patient awaiting availability of equipment on signout. Final Clinical Impression(s) / ED Diagnoses Final diagnoses:  Complication of feeding tube [facility])    Rx / DC Orders ED Discharge Orders     None         Gerhard Munch, MD 06/15/23 1621

## 2023-06-15 NOTE — Discharge Instructions (Signed)
The feeding tube was replaced.  The x-ray showed some distention of the stomach.  This was similar to prior.  If the patient develops any abdominal pain, vomiting, difficulty having bowel movements or passing gas he should return to the ED.

## 2023-06-15 NOTE — ED Provider Notes (Signed)
Handoff received from prior provider.  Patient with history of ALS who is G-tube and TPN dependent.  Here for clogged G-tube.  Handoff with plan to find right size of G-tube and replaced.  He does have some abdominal distention which has been intermittent for him.  No vomiting, follow-up x-ray and replace G-tube.  Likely discharge if able to replace.  I reviewed patient's x-ray after placing G-tube.  Appears to be located within the stomach.  Read is pending but family would like to go home.  I think this is reasonable.  They requested GI referral to help with their TPN and feeding plans.  Placed.  Return precautions given.  He does have diffuse gaseous distention in his abdomen.  Wife reports this is typical for him.  Abdomen is soft, nontender and he denies any pain.  It is unchanged from his prior CT per radiology.  He is not any vomiting, is tolerating feeds and is having regular bowel movements.  I do not think he is obstructed or has an ileus based on this.  Will have him follow-up closely.  Return precautions given.   Laurence Spates, MD 06/15/23 2110

## 2023-06-15 NOTE — ED Notes (Signed)
Pt transported home via [facility]. Pt's wife verbalized understanding of d/c instructions and follow up care.

## 2023-06-16 ENCOUNTER — Telehealth: Payer: Self-pay

## 2023-06-16 ENCOUNTER — Other Ambulatory Visit: Payer: Self-pay

## 2023-06-16 NOTE — Telephone Encounter (Signed)
Spoke with [facility] re: vent dependent stretcher outpatient transportation. [facility] can provide however will need to get approved by insurance or patient will need to pay oop to schedule appointments.   This RNCM spoke with patient's wife Gevena Cotton to advise of potential transportation resource discovered was with [facility] ph# [phone]. [name] reports she has spoke with [facility] earlier in the year and insurance would not cover they would have to pay out of pocket and unable to afford. [name] reports she has explored the following transportation options with no success: [facility], Medicaid and [facility] as well as [email or web address].info/. [facility] was only able to provide wheelchair transportation.    No additional TOC needs

## 2023-06-17 ENCOUNTER — Other Ambulatory Visit: Payer: Self-pay

## 2023-06-17 NOTE — Patient Outreach (Signed)
  Care Management   Visit Note  06/17/2023 Name: Shayne Hoult MRN: [id number] DOB: June 26, 1971  Subjective: Jarae Kleintop is a 52 y.o. year old male who is a primary care patient of [name], MD. The Care Management team was consulted for assistance.        Outpatient Encounter Medications as of 06/17/2023  Medication Sig   cetirizine HCl (ZYRTEC) 5 MG/5ML SOLN Take 10 mLs by mouth at bedtime.   glycopyrrolate (ROBINUL) 1 MG tablet Place 1 tablet (1 mg total) into feeding tube 2 (two) times daily as needed.   ipratropium-albuterol (DUONEB) 0.5-2.5 (3) MG/3ML SOLN USE 3 ML VIA NEBULIZER EVERY 6 HOURS AS NEEDED FOR SHORTNESS OF BREATH   polyethylene glycol powder (GLYCOLAX/MIRALAX) 17 GM/SCOOP powder Place 17 g into feeding tube daily. Titrate as needed for constipation   PRESCRIPTION MEDICATION Home TPN   scopolamine (TRANSDERM-SCOP) 1 MG/3DAYS Place 1 patch (1.5 mg total) onto the skin every 3 (three) days.   No facility-administered encounter medications on file as of 06/17/2023.

## 2023-06-17 NOTE — Patient Outreach (Signed)
Care Management   Visit Note   Name: Jerry [name] MRN: [id number] DOB: Feb 12, 1971  Subjective: Jerry [name] is a 52 y.o. year old male who is a primary care patient of [name], MD. The Care Management team was consulted for assistance.      Care Collaboration with [facility] and [name].  Assessment:  Review of patient past medical history, allergies, medications, health status, including review of consultants reports, laboratory and other test data, was performed as part of  evaluation and provision of care management services.    Outpatient Encounter Medications as of 06/16/2023  Medication Sig   cetirizine HCl (ZYRTEC) 5 MG/5ML SOLN Take 10 mLs by mouth at bedtime.   glycopyrrolate (ROBINUL) 1 MG tablet Place 1 tablet (1 mg total) into feeding tube 2 (two) times daily as needed.   ipratropium-albuterol (DUONEB) 0.5-2.5 (3) MG/3ML SOLN USE 3 ML VIA NEBULIZER EVERY 6 HOURS AS NEEDED FOR SHORTNESS OF BREATH   polyethylene glycol powder (GLYCOLAX/MIRALAX) 17 GM/SCOOP powder Place 17 g into feeding tube daily. Titrate as needed for constipation   PRESCRIPTION MEDICATION Home TPN   scopolamine (TRANSDERM-SCOP) 1 MG/3DAYS Place 1 patch (1.5 mg total) onto the skin every 3 (three) days.   No facility-administered encounter medications on file as of 06/16/2023.    Interventions:   Goals Addressed             This Visit's Progress    Care Management       Current Barriers:  Care Management support and education needs related to ALS Transportation barriers  Interventions:ALS Update 06/07/23: Completed conference call with Mr. Merryweather spouse/caregiver and [name] case worker [name]. [name] indicated that Mr. Roh's current Medicaid coverage does not provide transportation that can support stretcher transport. She provided contact information for a local agency, [name] that may be able to assist. Discussed plan for Medicaid coverage in 2025.  [name] confirmed that Mr. Mckeon's coverage will carry over, however the actual plan will change on July 06, 2023. Reports his case worker will also change on that date. Provided contact info for the pending case worker: [name].  Discussed plan for needed evaluations with Pulmonology and Gastroenterology. RNCM will contact both clinics to schedule and collaborate with PCP if new referrals are required. Mrs. Soos agrees with this plan. Will follow up within the next week. Update 06/09/23 Collaborated with the [facility] Pulmonology team. A referral was previously placed for evaluation. The appointment was not scheduled due to lack of stretcher transport. The pulmonology team will confirm if [name] can be evaluated in the clinic or if he will need to arrange evaluation with the inpatient pulmonary providers d/t having a tracheostomy. Pending follow up call from clinic Mr. Kaniecki will require a new referral prior to being evaluated by the GI team. Request forwarded to PCP. Update 06/10/23: Message received from Mrs. Green. Expressed concerns that Mr. Green may not have enough in-home nursing coverage d/t home health staff call outs and planned vacation. Requesting outreach with [facility] team to determine if shift will be covered or if another agency is available to ensure adequate coverage. Collaborated with Jennifer/[facility] coordination. Confirmed that Mr. Lassonde is still eligible for 112 hr/week of in home nursing care. Notes due to Mr. Green requiring TPN any nurse providing care with his case must complete TPN training however this is not a requirement for all nurses employed with the agency. He is also ventilatory dependent. Reports due to the complexity of his  case, other nurses within the agency may not be qualified to fill in when members of his care team unexpectedly call out.  Reports [facility] is currently in alliance with three other agencies, [place], [place] and [facility] that may be available to share the  hours if staffing permits, however [facility] has been able to cover 111 of the 112 hr/week. She agreed to follow up with Mrs. Neva Seat to discuss and ensure the agency is aware of incidents when staff contact the family for call-outs versus contacting the agency. RNCM will follow up next week. 06/14/23: Outreach with Mr. And Mrs. Neva Seat regarding in home nursing care. They are both in agreement that they prefer to remain with [facility].  Primary concern is lack of coverage when staff call out. Discussed plan to maintain a weekly calendar to include hours of coverage provided. This will help determine if a secondary agency is needed to backfill uncovered hours. Also discussed plan to ensure staff are signing out correctly. This will prevent discrepancies with actual hours of care provided. Mr. Lozo has been very proactive in his care. Though nonverbal he is able to communicate and send email messages via his Dynavox eye tracking device. He is agreeable to keeping the home care and care team updated of needs and changes. 06/16/23: Follow up outreach with [employer]. Reports Vikki Ports will confirm transportation for Mr. Tilson's pending appointment in January. Appointment with PCP has been scheduled for July 12, 2022 at 1000. Collaborated with [facility] Team regarding evaluation with Dr. Delford Field. Referral coordinator indicated that Dr. Delford Field is currently booked out until February but will likely attempt to work Mr. Vaness into his schedule. She is requesting a faxed copy of his last evaluation with the inpatient Pulmonology team d/t tracheostomy and ventilator dependence.  Attempted to reach the [facility] team to arrange evaluation. Mr. Volo was evaluated in the Emergency Room on 06/15/23 d/t his g-tube becoming dislodged. The tube was replaced by the Emergency Room Physician and Mr. Burbach was discharged home. A pending GI referral was in place prior to his ED visit. Left a message requesting a  call back.    Symptom Management Administer all medications as prescribed Continue administering TPN as prescribed. Follow instructions for PICC flushing  Monitor for s/sx of central line infection and notify provider as recommended Contact provider or care management team with questions and new concerns as needed.               PLAN Will follow up with specialty teams on 06/17/23.   [facility]  [facility], [facility] RN Care Manager Direct Dial: [phone] Website: [email or web address]

## 2023-06-18 LAB — LAB REPORT - SCANNED: EGFR: 143

## 2023-06-20 ENCOUNTER — Encounter: Payer: Self-pay | Admitting: Internal Medicine

## 2023-06-22 ENCOUNTER — Other Ambulatory Visit: Payer: Self-pay

## 2023-06-22 NOTE — Patient Outreach (Signed)
  Care Management   Visit Note  06/22/2023 Name: Jerry [name] MRN: [id number] DOB: [birth year]  Subjective: Jerry [name] is a 52 y.o. year old male who is a primary care patient of [name], MD. The Care Management team was consulted for assistance.      Care Collaboration: Transportation   Assessment:  Review of patient past medical history, allergies, medications, health status, including review of consultants reports, laboratory and other test data, was performed as part of  evaluation and provision of care management services.    Outpatient Encounter Medications as of 06/22/2023  Medication Sig   cetirizine HCl (ZYRTEC) 5 MG/5ML SOLN Take 10 mLs by mouth at bedtime.   glycopyrrolate (ROBINUL) 1 MG tablet Place 1 tablet (1 mg total) into feeding tube 2 (two) times daily as needed.   ipratropium-albuterol (DUONEB) 0.5-2.5 (3) MG/3ML SOLN USE 3 ML VIA NEBULIZER EVERY 6 HOURS AS NEEDED FOR SHORTNESS OF BREATH   polyethylene glycol powder (GLYCOLAX/MIRALAX) 17 GM/SCOOP powder Place 17 g into feeding tube daily. Titrate as needed for constipation   PRESCRIPTION MEDICATION Home TPN   scopolamine (TRANSDERM-SCOP) 1 MG/3DAYS Place 1 patch (1.5 mg total) onto the skin every 3 (three) days.   No facility-administered encounter medications on file as of 06/22/2023.

## 2023-06-25 LAB — LAB REPORT - SCANNED: EGFR: 152

## 2023-07-02 LAB — LAB REPORT - SCANNED: EGFR: 139

## 2023-07-11 ENCOUNTER — Other Ambulatory Visit: Payer: Self-pay

## 2023-07-13 ENCOUNTER — Ambulatory Visit: Payer: Medicare HMO | Admitting: Family Medicine

## 2023-07-15 ENCOUNTER — Other Ambulatory Visit: Payer: Self-pay

## 2023-07-15 NOTE — Patient Outreach (Signed)
  Care Management   Visit Note  07/15/2023 Name: Augie Vane MRN: [id number] DOB: [birth year]  Subjective: Crystal Ellwood is a 53 y.o. year old male who is a primary care patient of Sowles, Krichna, MD. The Care Management team was consulted for assistance.      Engaged with patient's spouse/caregiver via telephone.  Assessment:  Review of patient past medical history, allergies, medications, health status, including review of consultants reports, laboratory and other test data, was performed as part of  evaluation and provision of care management services.    Outpatient Encounter Medications as of 07/15/2023  Medication Sig   cetirizine  HCl (ZYRTEC ) 5 MG/5ML SOLN Take 10 mLs by mouth at bedtime.   glycopyrrolate  (ROBINUL ) 1 MG tablet Place 1 tablet (1 mg total) into feeding tube 2 (two) times daily as needed.   ipratropium-albuterol  (DUONEB) 0.5-2.5 (3) MG/3ML SOLN USE 3 ML VIA NEBULIZER EVERY 6 HOURS AS NEEDED FOR SHORTNESS OF BREATH   polyethylene glycol powder (GLYCOLAX /MIRALAX ) 17 GM/SCOOP powder Place 17 g into feeding tube daily. Titrate as needed for constipation   PRESCRIPTION MEDICATION Home TPN   scopolamine  (TRANSDERM-SCOP) 1 MG/3DAYS Place 1 patch (1.5 mg total) onto the skin every 3 (three) days.   No facility-administered encounter medications on file as of 07/15/2023.

## 2023-07-17 LAB — LAB REPORT - SCANNED: EGFR: 144

## 2023-07-20 ENCOUNTER — Other Ambulatory Visit: Payer: Self-pay

## 2023-07-23 LAB — LAB REPORT - SCANNED
EGFR: 147
HM Hepatitis Screen: NEGATIVE

## 2023-07-25 ENCOUNTER — Encounter: Payer: Self-pay | Admitting: Family Medicine

## 2023-07-26 ENCOUNTER — Other Ambulatory Visit: Payer: Self-pay

## 2023-07-27 ENCOUNTER — Other Ambulatory Visit: Payer: Self-pay

## 2023-07-27 ENCOUNTER — Telehealth (INDEPENDENT_AMBULATORY_CARE_PROVIDER_SITE_OTHER): Payer: Medicare HMO | Admitting: Family Medicine

## 2023-07-27 ENCOUNTER — Encounter: Payer: Self-pay | Admitting: Family Medicine

## 2023-07-27 DIAGNOSIS — J9611 Chronic respiratory failure with hypoxia: Secondary | ICD-10-CM | POA: Diagnosis not present

## 2023-07-27 DIAGNOSIS — K5909 Other constipation: Secondary | ICD-10-CM | POA: Diagnosis not present

## 2023-07-27 DIAGNOSIS — L853 Xerosis cutis: Secondary | ICD-10-CM

## 2023-07-27 DIAGNOSIS — Z930 Tracheostomy status: Secondary | ICD-10-CM

## 2023-07-27 DIAGNOSIS — H04123 Dry eye syndrome of bilateral lacrimal glands: Secondary | ICD-10-CM

## 2023-07-27 DIAGNOSIS — E43 Unspecified severe protein-calorie malnutrition: Secondary | ICD-10-CM

## 2023-07-27 DIAGNOSIS — G1221 Amyotrophic lateral sclerosis: Secondary | ICD-10-CM | POA: Diagnosis not present

## 2023-07-27 DIAGNOSIS — Z789 Other specified health status: Secondary | ICD-10-CM | POA: Insufficient documentation

## 2023-07-27 DIAGNOSIS — R6889 Other general symptoms and signs: Secondary | ICD-10-CM

## 2023-07-27 DIAGNOSIS — Z931 Gastrostomy status: Secondary | ICD-10-CM

## 2023-07-27 DIAGNOSIS — R627 Adult failure to thrive: Secondary | ICD-10-CM | POA: Insufficient documentation

## 2023-07-27 MED ORDER — POLYETHYLENE GLYCOL 3350 17 GM/SCOOP PO POWD: 17.0000 g | Freq: Every day | ORAL | 3 refills | Status: AC

## 2023-07-27 MED ORDER — CLEAR EYES NATURAL TEARS 5-6 MG/ML OP SOLN: 1.0000 [drp] | Freq: Three times a day (TID) | OPHTHALMIC | 2 refills | Status: AC | PRN

## 2023-07-27 MED ORDER — GLYCOPYRROLATE 1 MG PO TABS
1.0000 mg | ORAL_TABLET | Freq: Two times a day (BID) | ORAL | 3 refills | Status: DC | PRN
Start: 2023-07-27 — End: 2024-04-08

## 2023-07-27 MED ORDER — SCOPOLAMINE 1 MG/3DAYS TD PT72: 1.0000 | MEDICATED_PATCH | TRANSDERMAL | 3 refills | Status: DC

## 2023-07-27 MED ORDER — TRIAMCINOLONE ACETONIDE 0.1 % EX CREA: 1.0000 | TOPICAL_CREAM | Freq: Two times a day (BID) | CUTANEOUS | 0 refills | Status: DC

## 2023-07-27 MED ORDER — CETIRIZINE HCL 5 MG/5ML PO SOLN: 10.0000 mL | Freq: Every evening | ORAL | 3 refills | Status: AC

## 2023-07-27 MED ORDER — IPRATROPIUM-ALBUTEROL 0.5-2.5 (3) MG/3ML IN SOLN
3.0000 mL | RESPIRATORY_TRACT | 3 refills | Status: DC | PRN
Start: 2023-07-27 — End: 2023-11-04

## 2023-07-27 NOTE — Progress Notes (Signed)
Name: Jerry [name]   MRN: [id number]    DOB: 05-21-1971   Date:07/27/2023       Progress Note  Subjective  Chief Complaint  Chief Complaint  Patient presents with   Medical Management of Chronic Issues    I connected with  Jerry [name]  on 07/27/23 at  2:00 PM EST by a video enabled telemedicine application and verified that I am speaking with the correct person using two identifiers.  I discussed the limitations of evaluation and management by telemedicine and the availability of in person appointments. The patient expressed understanding and agreed to proceed with the virtual visit  Staff also discussed with the patient that there may be a patient responsible charge related to this service. Patient Location: at home Provider Location: [facility] Additional Individuals present: with his wife  Discussed the use of AI scribe software for clinical note transcription with the patient, who gave verbal consent to proceed.  History of Present Illness   The patient, diagnosed with ALS, has been primarily homebound due to transportation difficulties. Over the past year, the patient's care has been managed by Dr. Darleene Cleaver, who conducts home visits. The patient's ALS has been stable with no significant infections or hospital admissions. The few hospital visits were due to technical issues such as G-tube displacement and TPN lumen breakage. The patient also visited the ER for greenish discharge from the G-tube, which was managed with antibiotics.  The patient's current concern is persistent low potassium levels despite adjustments to the TPN. The patient's weight has been increasing, but this is thought to be due to bloating rather than actual weight gain. The patient has been experiencing itching on the back, which is thought to be due to dry skin. The patient communicates primarily through a computer system controlled by eye movements, as verbal communication is slow and difficult.  The patient is  tracheostomy-dependent and on a ventilator due to chronic respiratory failure with hypoxia. The tracheostomy care, including deep suctioning and monthly changes, is managed by the patient's caregivers and home nurses. The patient also has a PEG tube, through which only medications and water are administered. The patient is on TPN due to severe protein-calorie malnutrition is the only form of nutrional intake.  The patient's other medications include glycopyrrolate and scopolamine patches for secretion management, and Zyrtec solution for runny nose and mucus production. The patient also uses DuoNeb for tracheostomy care and Miralax for constipation management as needed. The patient has been using lubricating eye drops for dry and itchy eyes. The patient's kidney function has remained stable. The patient has not been seen by a pulmonologist or a GI specialist in over a year due to transportation issues.      Patient Active Problem List   Diagnosis Date Noted   Chronic respiratory failure with hypoxia (HCC) 06/22/2022   Ileus (HCC) 05/19/2022   Autonomic dysfunction 01/06/2022   Ventilator dependent (HCC) 01/06/2022   Tracheostomy dependent (HCC) 01/06/2022   Pressure injury of skin 12/27/2021   Anxiety 11/18/2021   Bed sore on buttock, right, unstageable (HCC) 11/18/2021   Severe protein-calorie malnutrition (HCC) 11/13/2021   Microscopic hematuria 10/29/2021   S/P percutaneous endoscopic gastrostomy (PEG) tube placement (HCC) 10/29/2021   ALS (amyotrophic lateral sclerosis) (HCC) 01/11/2019    Past Surgical History:  Procedure Laterality Date   IR REPLACE G-TUBE SIMPLE WO FLUORO  06/22/2022   MASS EXCISION Right 11/26/2014   Procedure: EXCISION MASS/GROIN EXCISION MASS;  Surgeon: Earline Mayotte, MD;  Location: [facility] ORS;  Service: General;  Laterality: Right;   MASS EXCISION Right 11/26/2014   Procedure: MINOR EXCISION OF MASS/POST THIGH MASS;  Surgeon: Earline Mayotte, MD;  Location:  [facility] ORS;  Service: General;  Laterality: Right;   PEG PLACEMENT  10/27/2021   PORTACATH PLACEMENT Right 06/19/2019    Family History  Problem Relation Age of Onset   Diabetes Father    Diabetes Mother    Diabetes Sister    Hypertension Sister    Diabetes Brother    Diabetes Sister    Fibromyalgia Sister    Diabetes Sister     Social History   Socioeconomic History   Marital status: Married    Spouse name: [name]   Number of children: 3   Years of education: Not on file   Highest education level: High school graduate  Occupational History   Occupation: Biomedical scientist    Comment: self employed   Occupation: Consulting civil engineer    Comment: [employer]  Tobacco Use   Smoking status: Never   Smokeless tobacco: Never  Advertising account planner   Vaping status: Never Used  Substance and Sexual Activity   Alcohol use: No    Alcohol/week: 0.0 standard drinks of alcohol   Drug use: No   Sexual activity: Yes    Partners: Female  Other Topics Concern   Not on file  Social History Narrative   Lives with wife and 3 children   Approved for disability 02/2019 for ALS    Social Drivers of Health   Financial Resource Strain: Low Risk  (09/29/2022)   Overall Financial Resource Strain (CARDIA)    Difficulty of Paying Living Expenses: Not very hard  Food Insecurity: No Food Insecurity (06/03/2023)   Hunger Vital Sign    Worried About Running Out of Food in the Last Year: Never true    Ran Out of Food in the Last Year: Never true  Transportation Needs: Unmet Transportation Needs (06/03/2023)   PRAPARE - Administrator, Civil Service (Medical): Yes    Lack of Transportation (Non-Medical): No  Physical Activity: Inactive (09/29/2022)   Exercise Vital Sign    Days of Exercise per Week: 0 days    Minutes of Exercise per Session: 0 min  Stress: No Stress Concern Present (09/04/2020)   [employer] of Occupational Health - Occupational Stress Questionnaire    Feeling of Stress : Not at all   Social Connections: Unknown (09/29/2022)   Social Connection and Isolation Panel [NHANES]    Frequency of Communication with Friends and Family: More than three times a week    Frequency of Social Gatherings with Friends and Family: More than three times a week    Attends Religious Services: Not on Insurance claims handler of Clubs or Organizations: No    Attends Banker Meetings: Never    Marital Status: Married  Catering manager Violence: Patient Unable To Answer (06/03/2023)   Humiliation, Afraid, Rape, and Kick questionnaire    Fear of Current or Ex-Partner: Patient unable to answer    Emotionally Abused: Patient unable to answer    Physically Abused: Patient unable to answer    Sexually Abused: Patient unable to answer     Current Outpatient Medications:    cetirizine HCl (ZYRTEC) 5 MG/5ML SOLN, Take 10 mLs by mouth at bedtime., Disp: , Rfl:    glycopyrrolate (ROBINUL) 1 MG tablet, Place 1 tablet (1 mg total) into feeding tube 2 (two) times daily as  needed., Disp: 60 tablet, Rfl: 0   ipratropium-albuterol (DUONEB) 0.5-2.5 (3) MG/3ML SOLN, USE 3 ML VIA NEBULIZER EVERY 6 HOURS AS NEEDED FOR SHORTNESS OF BREATH, Disp: 360 mL, Rfl: 0   polyethylene glycol powder (GLYCOLAX/MIRALAX) 17 GM/SCOOP powder, Place 17 g into feeding tube daily. Titrate as needed for constipation, Disp: 255 g, Rfl: 0   PRESCRIPTION MEDICATION, Home TPN, Disp: , Rfl:    scopolamine (TRANSDERM-SCOP) 1 MG/3DAYS, Place 1 patch (1.5 mg total) onto the skin every 3 (three) days., Disp: 10 patch, Rfl: 0  Allergies  Allergen Reactions   Crab [Shellfish Allergy] Rash    I personally reviewed active problem list, medication list, allergies with the patient/caregiver today.   ROS  Ten systems reviewed and is negative except as mentioned in HPI    Objective  Virtual encounter, vitals not obtained.  There is no height or weight on file to calculate BMI.  Physical Exam  Awake, alert, smiling    PHQ2/9:    07/27/2023   11:29 AM 09/29/2022    5:45 PM 07/22/2022    9:46 AM 11/13/2021    9:03 AM 10/03/2020   10:22 AM  Depression screen PHQ 2/9  Decreased Interest 0 0 0 0 0  Down, Depressed, Hopeless 0 0 0 0 0  PHQ - 2 Score 0 0 0 0 0  Altered sleeping 0      Tired, decreased energy 0      Change in appetite 0      Feeling bad or failure about yourself  0      Trouble concentrating 0      Moving slowly or fidgety/restless 0      Suicidal thoughts 0      PHQ-9 Score 0      Difficult doing work/chores Not difficult at all       PHQ-2/9 Result is negative.    Fall Risk:    07/27/2023   11:29 AM 09/29/2022    5:48 PM 07/22/2022    9:43 AM 11/18/2021   11:22 AM 11/13/2021    9:03 AM  Fall Risk   Falls in the past year? 0 Exclusion - non ambulatory Exclusion - non ambulatory 0 0  Number falls in past yr: 0    0  Injury with Fall? 0    0  Risk for fall due to : No Fall Risks  Impaired mobility;Impaired balance/gait Impaired mobility No Fall Risks  Follow up Falls prevention discussed;Education provided;Falls evaluation completed  Falls prevention discussed Falls evaluation completed;Education provided Falls prevention discussed     Assessment and Plan    Amyotrophic Lateral Sclerosis (ALS) Stable with no infections or hospitalizations. Patient is dependent on tracheostomy and ventilator. No longer seeing neurologist due to lack of transportation  -Continue current management plan.  Gastrostomy Tube (G-tube) Complications Three ER visits for G-tube related issues (dislodgement, TPN lumen break, greenish discharge). -Continue current management plan.  Hypokalemia Persistent low potassium despite adjustments in TPN. -Plan to administer potassium through G-tube but waiting for pharmacy instructions .   Protein-Calorie Malnutrition Improved with TPN, but concerns about rapid weight gain and abdominal distension. -Monitor weight and nutritional status  closely.  Pruritus/dry skin Complaints of itching on the back, possibly due to dry skin. -Prescribe Triamcinolone to mix with lotion for application on dry spots twice daily.  Excessive Saliva Production Managed with Scopolamine patches and Glycopyrrolate. -Continue Scopolamine patches and Glycopyrrolate twice daily.  Constipation Managed with Miralax as needed. -Continue Miralax as needed.  Dry Eyes Complaints of dry and itchy eyes. -Prescribe Polyvinyl Alcohol eye drops three times daily as needed.  Possible Diabetes Elevated blood sugar levels noted, but unclear if patient has diabetes. -He recently had A1C labs done but results are pending   Follow-up in 6 months.       I discussed the assessment and treatment plan with the patient. The patient was provided an opportunity to ask questions and all were answered. The patient agreed with the plan and demonstrated an understanding of the instructions.  The patient was advised to call back or seek an in-person evaluation if the symptoms worsen or if the condition fails to improve as anticipated.  I provided 25  minutes of non-face-to-face time during this encounter.

## 2023-07-28 ENCOUNTER — Other Ambulatory Visit: Payer: Self-pay | Admitting: Family Medicine

## 2023-07-28 ENCOUNTER — Other Ambulatory Visit: Payer: Self-pay

## 2023-07-28 DIAGNOSIS — E876 Hypokalemia: Secondary | ICD-10-CM

## 2023-07-28 MED ORDER — POTASSIUM CHLORIDE 40 MEQ/15ML (20%) PO SOLN: 15.0000 mL | Freq: Two times a day (BID) | ORAL | 0 refills | Status: DC

## 2023-07-29 ENCOUNTER — Other Ambulatory Visit: Payer: Self-pay

## 2023-07-29 NOTE — Patient Outreach (Signed)
Care Management   Visit Note  07/29/2023 Name: Jerry [name] MRN: [id number] DOB: 03-21-1971  Subjective: Jerry [name] is a 53 y.o. year old male who is a primary care patient of [name], MD. The Care Management team was consulted for assistance.      Engaged with Jerry [name] spouse/caregiver via telephone. Collaborated with [employer] Pharmacist/Justin  Assessment:  Review of patient past medical history, allergies, medications, health status, including review of consultants reports, laboratory and other test data, was performed as part of  evaluation and provision of care management services.   Outpatient Encounter Medications as of 07/29/2023  Medication Sig   cetirizine HCl (ZYRTEC) 5 MG/5ML SOLN Take 10 mLs (10 mg total) by mouth at bedtime.   glycopyrrolate (ROBINUL) 1 MG tablet Place 1 tablet (1 mg total) into feeding tube 2 (two) times daily as needed.   ipratropium-albuterol (DUONEB) 0.5-2.5 (3) MG/3ML SOLN Take 3 mLs by nebulization every 4 (four) hours as needed.   polyethylene glycol powder (GLYCOLAX/MIRALAX) 17 GM/SCOOP powder Place 17 g into feeding tube daily. Titrate as needed for constipation   Polyvinyl Alcohol-Povidone (CLEAR EYES NATURAL TEARS) 5-6 MG/ML SOLN Apply 1 drop to eye 3 (three) times daily as needed.   Potassium Chloride 40 MEQ/15ML (20%) SOLN Take 15 mLs by mouth 2 (two) times daily.   PRESCRIPTION MEDICATION Home TPN   scopolamine (TRANSDERM-SCOP) 1 MG/3DAYS Place 1 patch (1.5 mg total) onto the skin every 3 (three) days.   triamcinolone cream (KENALOG) 0.1 % Apply 1 Application topically 2 (two) times daily.   No facility-administered encounter medications on file as of 07/29/2023.    Interventions:   Goals Addressed             This Visit's Progress    Hypokalemia       Current Barriers:  Care Management support and education needs related to Hypokalemia   Planned Interventions: Call received from Infusion Pharmacist/Justin regarding  Jerry [name]'s current TPN needs and low potassium levels. Jill Alexanders reports Jerry [name] current potassium in 3.1mg  and the pharmacy is unable to add additional volume to his TPN solution. Recommended prescription for Potassium chloride 75meq/15ml oral solution twice a day for three days via g-tube. Jill Alexanders requested update if recommended dose is not available at [facility]. Contacted PCP/Dr. Carlynn Purl regarding request. Medication ordered today. Contacted [facility]. Per pharmacy staff, they are pending delivery of medications later this evening. Anticipates requested solution arriving with the evening delivered (after 5 pm) Mr. Lacek spouse/caregiver updated. Reviewed instructions for administering and advised to pick medication up tomorrow if unable to pick up from [facility] after 5 pm today. Reviewed s/sx that require immediate medical follow up. Update 07/29/23: Call received from Jerry [name]'s spouse/caregiver. Reports Jerry [name] has experienced several episodes of bloating and diarrhea since receiving potassium. Denies other concerns. Denies blood in stool. Per previous discussion with PCP, tend would adjust dose as recommended by pharmacist. Collaborated with Justin/[facility] Pharmacist regarding symptoms as they tend to be common with potassium and additional fluid cannot be added to his TPN. Jill Alexanders advised that dose be decreased from twice a day to once a day and flush g-tube with water after administering to decrease GI side effects. Agreed to contact caregiver later today to further discuss. Updated Jerry [name] and spouse of plan to decrease dosing to once a day.  Thorough review of worsening s/sx that require provider notification and medical follow up.          PLAN Will follow up within the  next week   Juanell Fairly [facility] RN Care Manager Direct Dial: [phone]  Fax: [phone] Website: [email or web address]

## 2023-07-30 LAB — LAB REPORT - SCANNED: EGFR: 140

## 2023-08-01 ENCOUNTER — Other Ambulatory Visit: Payer: Self-pay

## 2023-08-05 ENCOUNTER — Other Ambulatory Visit: Payer: Self-pay

## 2023-08-05 ENCOUNTER — Telehealth: Payer: Self-pay

## 2023-08-05 NOTE — Patient Outreach (Signed)
  Care Management   Outreach Note  08/05/2023 Name: Alexanderjames Berg MRN: [id number] DOB: 07/12/1970  An unsuccessful outreach attempt was made today for a scheduled Care Management visit.    Follow Up Plan:  A HIPAA compliant phone message was left for the patient's caregiver/spouse requesting a return call.     Juanell Fairly [facility] Population [facility] RN Care Manager Direct Dial: [phone]  Fax: [phone] Website: [email or web address]

## 2023-08-06 LAB — LAB REPORT - SCANNED: EGFR: 143

## 2023-08-08 ENCOUNTER — Encounter: Payer: Self-pay | Admitting: Family Medicine

## 2023-08-10 ENCOUNTER — Other Ambulatory Visit: Payer: Self-pay

## 2023-08-16 LAB — LAB REPORT - SCANNED: EGFR: 146

## 2023-08-16 NOTE — Patient Outreach (Addendum)
 Care Management   Visit Note   Name: Jerry [name] MRN: [id number] DOB: 1971/04/23  Subjective: Jerry [name] is a 53 y.o. year old male who is a primary care patient of Sowles, Krichna, MD. The Care Management team was consulted for assistance.      Engaged with Jerry [name] spouse/caregiver via telephone.  Assessment:  Review of patient past medical history, allergies, medications, health status, including review of consultants reports, laboratory and other test data, was performed as part of  evaluation and provision of care management services.   Outpatient Encounter Medications as of 08/10/2023  Medication Sig   cetirizine  HCl (ZYRTEC ) 5 MG/5ML SOLN Take 10 mLs (10 mg total) by mouth at bedtime.   glycopyrrolate  (ROBINUL ) 1 MG tablet Place 1 tablet (1 mg total) into feeding tube 2 (two) times daily as needed.   ipratropium-albuterol  (DUONEB) 0.5-2.5 (3) MG/3ML SOLN Take 3 mLs by nebulization every 4 (four) hours as needed.   polyethylene glycol powder (GLYCOLAX /MIRALAX ) 17 GM/SCOOP powder Place 17 g into feeding tube daily. Titrate as needed for constipation   Polyvinyl Alcohol -Povidone (CLEAR EYES NATURAL TEARS) 5-6 MG/ML SOLN Apply 1 drop to eye 3 (three) times daily as needed.   Potassium Chloride  40 MEQ/15ML (20%) SOLN Take 15 mLs by mouth 2 (two) times daily.   PRESCRIPTION MEDICATION Home TPN   scopolamine  (TRANSDERM-SCOP) 1 MG/3DAYS Place 1 patch (1.5 mg total) onto the skin every 3 (three) days.   triamcinolone  cream (KENALOG ) 0.1 % Apply 1 Application topically 2 (two) times daily.   No facility-administered encounter medications on file as of 08/10/2023.     Interventions:   Goals      Care Management     Hypokalemia     Current Barriers:  Care Management support and education needs related to Hypokalemia   Planned Interventions: Call received from Infusion Pharmacist/Justin regarding Jerry [name]'s current TPN needs and low potassium levels. Eva reports Jerry [name]  current potassium in 3.1mg  and the pharmacy is unable to add additional volume to his TPN solution. Recommended prescription for Potassium chloride  82meq/15ml oral solution twice a day for three days via g-tube. Eva requested update if recommended dose is not available at [facility]. Contacted PCP/Dr. Glenard regarding request. Medication ordered today. Contacted [facility]. Per pharmacy staff, they are pending delivery of medications later this evening. Anticipates requested solution arriving with the evening delivered (after 5 pm) Jerry [name] spouse/caregiver updated. Reviewed instructions for administering and advised to pick medication up tomorrow if unable to pick up from [facility] after 5 pm today. Reviewed s/sx that require immediate medical follow up. Update 07/29/23: Call received from Jerry [name] spouse/caregiver. Reports Jerry [name] has experienced several episodes of bloating and diarrhea since receiving potassium. Denies other concerns. Denies blood in stool. Per previous discussion with PCP, tend would adjust dose as recommended by pharmacist. Collaborated with Justin/[facility] Pharmacist regarding symptoms as they tend to be common with potassium and additional fluid cannot be added to his TPN. Eva advised that dose be decreased from twice a day to once a day and flush g-tube with water  after administering to decrease GI side effects. Agreed to contact caregiver later today to further discuss. Updated Jerry [name] and spouse of plan to decrease dosing to once a day.  Thorough review of worsening s/sx that require provider notification and medical follow up. Update 08/01/23: Call received from Jerry [name] spouse/caregiver to update. Reports Jerry [name] experienced diarrhea over the weekend. Symptoms were not as severe within the last 24 hours.  Aware of need to only give one dose per day followed by fluid. She will continue to give until a total of six doses is administered. Home Health team  will redraw Jerry [name] labs on 08/05/23. Additional recommendations regarding need for supplements will be considered after lab results. Reviewed of worsening s/sx that require provider notification and medical follow up. Update 08/10/23: Outreach with Jerry [name] spouse/caregiver. Notes he is doing well today. No worsening symptoms or concerns since last outreach.  Reports diarrhea and bloating r/t potassium administering has resolved.  Discussed recent lab results. His potassium has improved from 3.1 and is currently within range at 3.7. Reports discussing plan to transition Jerry [name] from TPN back to tube feedings. Will discuss further with his in-home provider Dr. Asenso during his next visit. Aware that GI evaluation may still be needed if specific recommendation for formula is required.          PLAN Will follow up within the next week   Jackson Acron [facility] RN Care Manager Direct Dial: [phone]  Fax: [phone] Website: [email or web address]

## 2023-08-16 NOTE — Patient Outreach (Signed)
  Care Management   Visit Note   Name: Jerry [name] MRN: [id number] DOB: 1970/12/08  Subjective: Jerry [name] is a 53 y.o. year old male who is a primary care patient of [name], MD. The Care Management team was consulted for assistance.      Engaged with patient's spouse/caregiver and [employer] Pharmacist/Justin via telephone.  Assessment:  Review of patient past medical history, allergies, medications, health status, including review of consultants reports, laboratory and other test data, was performed as part of  evaluation and provision of care management services.   Outpatient Encounter Medications as of 07/28/2023  Medication Sig   Potassium Chloride 40 MEQ/15ML (20%) SOLN Take 15 mLs by mouth 2 (two) times daily.   cetirizine HCl (ZYRTEC) 5 MG/5ML SOLN Take 10 mLs (10 mg total) by mouth at bedtime.   glycopyrrolate (ROBINUL) 1 MG tablet Place 1 tablet (1 mg total) into feeding tube 2 (two) times daily as needed.   ipratropium-albuterol (DUONEB) 0.5-2.5 (3) MG/3ML SOLN Take 3 mLs by nebulization every 4 (four) hours as needed.   polyethylene glycol powder (GLYCOLAX/MIRALAX) 17 GM/SCOOP powder Place 17 g into feeding tube daily. Titrate as needed for constipation   Polyvinyl Alcohol-Povidone (CLEAR EYES NATURAL TEARS) 5-6 MG/ML SOLN Apply 1 drop to eye 3 (three) times daily as needed.   PRESCRIPTION MEDICATION Home TPN   scopolamine (TRANSDERM-SCOP) 1 MG/3DAYS Place 1 patch (1.5 mg total) onto the skin every 3 (three) days.   triamcinolone cream (KENALOG) 0.1 % Apply 1 Application topically 2 (two) times daily.   No facility-administered encounter medications on file as of 07/28/2023.     Interventions:    Goals      Hypokalemia     Planned Interventions: Call received from Infusion Pharmacist/Justin regarding Mr. Delaguila's current TPN needs and low potassium levels. Jerry [name] reports Mr. Shill current potassium in 3.1mg  and the pharmacy is unable to add additional volume to  his TPN solution. Recommended prescription for Potassium chloride 68meq/15ml oral solution twice a day for three days via g-tube. Jerry [name] requested update if recommended dose is not available at [facility]. Contacted PCP/Dr. Carlynn Purl regarding request. Medication ordered today. Contacted [facility]. Per pharmacy staff, they are pending delivery of medications later this evening. Anticipates requested solution arriving with the evening delivered (after 5 pm) Mr. Craine spouse/caregiver updated. Reviewed instructions for administering and advised to pick medication up tomorrow if unable to pick up from [facility] after 5 pm today. Reviewed s/sx that require immediate medical follow up.        PLAN Will follow up within the next week   Jerry [name] [facility] RN Care Manager Direct Dial: [phone]  Fax: [phone] Website: [name]

## 2023-08-16 NOTE — Patient Outreach (Signed)
Care Management   Visit Note   Name: Jerry [name] MRN: [id number] DOB: 1971-05-13  Subjective: Jerry [name] is a 53 y.o. year old male who is a primary care patient of Jerry Cory, MD. The Care Management team was consulted for assistance.      Engaged with patient's spouse/caregiver via telephone.  Assessment:  Review of patient past medical history, allergies, medications, health status, including review of consultants reports, laboratory and other test data, was performed as part of  evaluation and provision of care management services.   Outpatient Encounter Medications as of 08/01/2023  Medication Sig   cetirizine HCl (ZYRTEC) 5 MG/5ML SOLN Take 10 mLs (10 mg total) by mouth at bedtime.   glycopyrrolate (ROBINUL) 1 MG tablet Place 1 tablet (1 mg total) into feeding tube 2 (two) times daily as needed.   ipratropium-albuterol (DUONEB) 0.5-2.5 (3) MG/3ML SOLN Take 3 mLs by nebulization every 4 (four) hours as needed.   polyethylene glycol powder (GLYCOLAX/MIRALAX) 17 GM/SCOOP powder Place 17 g into feeding tube daily. Titrate as needed for constipation   Polyvinyl Alcohol-Povidone (CLEAR EYES NATURAL TEARS) 5-6 MG/ML SOLN Apply 1 drop to eye 3 (three) times daily as needed.   Potassium Chloride 40 MEQ/15ML (20%) SOLN Take 15 mLs by mouth 2 (two) times daily.   PRESCRIPTION MEDICATION Home TPN   scopolamine (TRANSDERM-SCOP) 1 MG/3DAYS Place 1 patch (1.5 mg total) onto the skin every 3 (three) days.   triamcinolone cream (KENALOG) 0.1 % Apply 1 Application topically 2 (two) times daily.   No facility-administered encounter medications on file as of 08/01/2023.    Interventions:   Goals Addressed             This Visit's Progress    (Respiratory Failure in ALS) Expected Outcome:  Monitor, Self-Manage And Reduce Symptoms of Respiratory Failure in ALS       Hypokalemia       Current Barriers:  Care Management support and education needs related to Hypokalemia   Planned  Interventions: Call received from Infusion Pharmacist/Justin regarding Jerry [name]'s current TPN needs and low potassium levels. Jill Alexanders reports Jerry [name] current potassium in 3.1mg  and the pharmacy is unable to add additional volume to his TPN solution. Recommended prescription for Potassium chloride 62meq/15ml oral solution twice a day for three days via g-tube. Jill Alexanders requested update if recommended dose is not available at [facility]. Contacted PCP/Dr. Carlynn Purl regarding request. Medication ordered today. Contacted [facility]. Per pharmacy staff, they are pending delivery of medications later this evening. Anticipates requested solution arriving with the evening delivered (after 5 pm) Mr. Sanden spouse/caregiver updated. Reviewed instructions for administering and advised to pick medication up tomorrow if unable to pick up from [facility] after 5 pm today. Reviewed s/sx that require immediate medical follow up. Update 07/29/23: Call received from Jerry [name]'s spouse/caregiver. Reports Jerry [name] has experienced several episodes of bloating and diarrhea since receiving potassium. Denies other concerns. Denies blood in stool. Per previous discussion with PCP, tend would adjust dose as recommended by pharmacist. Collaborated with Justin/[facility] Pharmacist regarding symptoms as they tend to be common with potassium and additional fluid cannot be added to his TPN. Jill Alexanders advised that dose be decreased from twice a day to once a day and flush g-tube with water after administering to decrease GI side effects. Agreed to contact caregiver later today to further discuss. Updated Jerry [name] and spouse of plan to decrease dosing to once a day.  Thorough review of worsening s/sx that require provider notification and  medical follow up. Update 08/01/23: Call received from Jerry [name]'s spouse/caregiver to update. Reports Jerry [name] experienced diarrhea over the weekend. Symptoms were not as severe within the last 24  hours. Aware of need to only give one dose per day followed by fluid. She will continue to give until a total of six doses is administered. Home Health team will redraw Jerry [name] labs on 08/05/23. Additional recommendations regarding need for supplements will be considered after lab results. Reviewed of worsening s/sx that require provider notification and medical follow up.        PLAN Will follow up on 08/05/23   Juanell Fairly [facility] Population Health RN Care Manager Direct Dial: [phone]  Fax: [phone] Website: [email or web address]

## 2023-08-17 ENCOUNTER — Other Ambulatory Visit: Payer: Self-pay

## 2023-08-18 ENCOUNTER — Other Ambulatory Visit: Payer: Self-pay | Admitting: Family Medicine

## 2023-08-18 DIAGNOSIS — R6889 Other general symptoms and signs: Secondary | ICD-10-CM

## 2023-08-18 DIAGNOSIS — G1221 Amyotrophic lateral sclerosis: Secondary | ICD-10-CM

## 2023-08-20 LAB — LAB REPORT - SCANNED: Calcium: 9

## 2023-08-22 ENCOUNTER — Encounter: Payer: Self-pay | Admitting: Family Medicine

## 2023-08-23 ENCOUNTER — Encounter: Payer: Self-pay | Admitting: Family Medicine

## 2023-08-24 ENCOUNTER — Other Ambulatory Visit: Payer: Self-pay

## 2023-08-25 ENCOUNTER — Other Ambulatory Visit: Payer: Self-pay | Admitting: Family Medicine

## 2023-08-25 NOTE — Patient Outreach (Signed)
Care Management   Visit Note   Name: Jerry [name] MRN: [id number] DOB: [birth year]  Subjective: Jerry [name] is a 53 y.o. year old male who is a primary care patient of Jerry Cory, MD. The Care Management team was consulted for assistance.      Engaged with patient's spouse/caregiver via telephone.  Assessment:  Review of patient past medical history, allergies, medications, health status, including review of consultants reports, laboratory and other test data, was performed as part of  evaluation and provision of care management services.   Outpatient Encounter Medications as of 08/24/2023  Medication Sig   cetirizine HCl (ZYRTEC) 5 MG/5ML SOLN Take 10 mLs (10 mg total) by mouth at bedtime.   glycopyrrolate (ROBINUL) 1 MG tablet Place 1 tablet (1 mg total) into feeding tube 2 (two) times daily as needed.   ipratropium-albuterol (DUONEB) 0.5-2.5 (3) MG/3ML SOLN Take 3 mLs by nebulization every 4 (four) hours as needed.   polyethylene glycol powder (GLYCOLAX/MIRALAX) 17 GM/SCOOP powder Place 17 g into feeding tube daily. Titrate as needed for constipation   Polyvinyl Alcohol-Povidone (CLEAR EYES NATURAL TEARS) 5-6 MG/ML SOLN Apply 1 drop to eye 3 (three) times daily as needed.   Potassium Chloride 40 MEQ/15ML (20%) SOLN Take 15 mLs by mouth 2 (two) times daily.   PRESCRIPTION MEDICATION Home TPN   scopolamine (TRANSDERM-SCOP) 1 MG/3DAYS PLACE 1 PATCH ONTO THE SKIN EVERY 3 DAYS   triamcinolone cream (KENALOG) 0.1 % Apply 1 Application topically 2 (two) times daily.   No facility-administered encounter medications on file as of 08/24/2023.    Interventions:   Goals Addressed             This Visit's Progress    Monitor, Self-Manage And Reduce Symptoms of Hypokalemia       Current Barriers:  Care Management support and education needs related to Hypokalemia   Planned Interventions: Call received from Infusion Pharmacist/Jerry [name] regarding [name]'s current TPN needs and low  potassium levels. Jerry [name] reports [name] current potassium in 3.1mg  and the pharmacy is unable to add additional volume to his TPN solution. Recommended prescription for Potassium chloride 37meq/15ml oral solution twice a day for three days via g-tube. Jerry [name] requested update if recommended dose is not available at [facility]. Contacted PCP/Jerry [name] regarding request. Medication ordered today. Contacted [facility]. Per pharmacy staff, they are pending delivery of medications later this evening. Anticipates requested solution arriving with the evening delivered (after 5 pm) Jerry [name] spouse/caregiver updated. Reviewed instructions for administering and advised to pick medication up tomorrow if unable to pick up from [name] after 5 pm today. Reviewed s/sx that require immediate medical follow up. Update 07/29/23: Call received from Jerry [name]'s spouse/caregiver. Reports Jerry [name] has experienced several episodes of bloating and diarrhea since receiving potassium. Denies other concerns. Denies blood in stool. Per previous discussion with PCP, tend would adjust dose as recommended by pharmacist. Collaborated with Jerry [name]/[facility] Pharmacist regarding symptoms as they tend to be common with potassium and additional fluid cannot be added to his TPN. Jerry [name] advised that dose be decreased from twice a day to once a day and flush g-tube with water after administering to decrease GI side effects. Agreed to contact caregiver later today to further discuss. Updated Jerry [name] and spouse of plan to decrease dosing to once a day.  Thorough review of worsening s/sx that require provider notification and medical follow up. Update 08/01/23: Call received from Jerry [name]'s spouse/caregiver to update. Reports Jerry [name] experienced diarrhea over the weekend.  Symptoms were not as severe within the last 24 hours. Aware of need to only give one dose per day followed by fluid. She will continue to give until a total of  six doses is administered. Home Health team will redraw Jerry [name] labs on 08/05/23. Additional recommendations regarding need for supplements will be considered after lab results. Reviewed of worsening s/sx that require provider notification and medical follow up. Update 08/10/23: Outreach with Jerry [name] spouse/caregiver. Notes he is doing well today. No worsening symptoms or concerns since last outreach.  Reports diarrhea and bloating r/t potassium administering has resolved.  Discussed recent lab results. His potassium has improved from 3.1 and is currently within range at 3.7. Reports discussing plan to transition Jerry [name] from TPN back to tube feedings. Will discuss further with his in-home provider Jerry [name] during his next visit. Aware that GI evaluation may still be needed if specific recommendation for formula is required. Update 08/24/23 Outreach with patient's caregiver/spouse regarding updated plan for management of hypokalemia. Most recent labs were obtained on 08/19/23. Potassium was 3.8. Spouse aware that levels are currently within range but on the lower end of normal. She was advised by the infusion pharmacy team to continue administering Potassium chloride 34meq/15ml oral solution. Dose is to be administered once a day with fluid via g-tube to decrease episodes of GI upset often associated with potassium administration. Spouse advised to monitor symptoms and notify provider or care team for changes. Discussed concerning and worsening symptoms and indications for seeking immediate medical attention.         PLAN Will follow up next week    Jerry [name] Health RN Care Manager Direct Dial: [phone]  Fax: [phone] Website: [email or web address]

## 2023-08-27 LAB — LAB REPORT - SCANNED: EGFR: 152

## 2023-08-30 ENCOUNTER — Encounter: Payer: Self-pay | Admitting: Family Medicine

## 2023-08-31 ENCOUNTER — Encounter: Payer: Self-pay | Admitting: Family Medicine

## 2023-08-31 ENCOUNTER — Other Ambulatory Visit: Payer: Self-pay

## 2023-08-31 DIAGNOSIS — Z789 Other specified health status: Secondary | ICD-10-CM | POA: Insufficient documentation

## 2023-08-31 DIAGNOSIS — E119 Type 2 diabetes mellitus without complications: Secondary | ICD-10-CM | POA: Insufficient documentation

## 2023-08-31 HISTORY — DX: Type 2 diabetes mellitus without complications: E11.9

## 2023-09-01 ENCOUNTER — Other Ambulatory Visit: Payer: Self-pay

## 2023-09-01 NOTE — Patient Outreach (Unsigned)
  Care Management   Visit Note  09/01/2023 Name: Jerry [name] MRN: [id number] DOB: Nov 09, 1970  Subjective: Jerry [name] is a 53 y.o. year old male who is a primary care patient of [name], MD. Engaged with Jerry [name] spouse/caregiver via telephone today.   Assessment:  Review of patient past medical history, allergies, medications, health status, including review of consultants reports, laboratory and other test data, was performed as part of  evaluation and provision of care management services.    Outpatient Encounter Medications as of 09/01/2023  Medication Sig Note   cetirizine HCl (ZYRTEC) 5 MG/5ML SOLN Take 10 mLs (10 mg total) by mouth at bedtime.    glycopyrrolate (ROBINUL) 1 MG tablet Place 1 tablet (1 mg total) into feeding tube 2 (two) times daily as needed.    ipratropium-albuterol (DUONEB) 0.5-2.5 (3) MG/3ML SOLN Take 3 mLs by nebulization every 4 (four) hours as needed.    polyethylene glycol powder (GLYCOLAX/MIRALAX) 17 GM/SCOOP powder Place 17 g into feeding tube daily. Titrate as needed for constipation    Polyvinyl Alcohol-Povidone (CLEAR EYES NATURAL TEARS) 5-6 MG/ML SOLN Apply 1 drop to eye 3 (three) times daily as needed.    Potassium Chloride 40 MEQ/15ML (20%) SOLN TAKE 15 ML BY MOUTH TWICE DAILY 09/01/2023: Currently taking once a day   PRESCRIPTION MEDICATION Home TPN    scopolamine (TRANSDERM-SCOP) 1 MG/3DAYS PLACE 1 PATCH ONTO THE SKIN EVERY 3 DAYS    triamcinolone cream (KENALOG) 0.1 % Apply 1 Application topically 2 (two) times daily.    No facility-administered encounter medications on file as of 09/01/2023.    Interventions: Interventions Today    Flowsheet Row Most Recent Value  Chronic Disease   Chronic disease during today's visit Other  [ALS and Hypokalemia]  General Interventions   General Interventions Discussed/Reviewed General Interventions Discussed, Labs, Doctor Visits, [employer] home visit with [employer]  today]  Doctor Visits Discussed/Reviewed Doctor Visits Reviewed  Education Interventions   Education Provided Provided Education  Provided Verbal Education On Nutrition, Labs, Medication, Development worker, community, Other, [employer] Reviewed --  [employer]. Current level is 4.1]  Mental Health Interventions   Mental Health Discussed/Reviewed Coping Strategies  Nutrition Interventions   Nutrition Discussed/Reviewed Nutrition Reviewed  [Currently receiving  TPN. Discussed in-home provider's plan to transition to tube feedings. Plans to discuss further with a dietician/nutritionist to determine optimal formula.]  Pharmacy Interventions   Pharmacy Dicussed/Reviewed Medications and their functions  [Reports administering daily via tube as advised]  Safety Interventions   Safety Discussed/Reviewed Safety Reviewed  Advanced Directive Interventions   Advanced Directives Discussed/Reviewed Advanced Directives Reviewed       PLAN [name] caregiver/spouse agreed to follow up next week.    Juanell Fairly [facility] Population Health RN Care Manager Direct Dial: [phone]  Fax: [phone] Website: [email or web address]

## 2023-09-01 NOTE — Patient Instructions (Signed)
Thank you for allowing the Care Management team to participate in your care.

## 2023-09-03 LAB — LAB REPORT - SCANNED: EGFR: 154

## 2023-09-08 ENCOUNTER — Other Ambulatory Visit: Payer: Self-pay

## 2023-09-08 ENCOUNTER — Telehealth: Payer: Self-pay

## 2023-09-08 NOTE — Patient Outreach (Signed)
  Care Management   Outreach Note  09/08/2023 Name: Jerry [name] MRN: [id number] DOB: [birth year]   An unsuccessful outreach attempt was made today for a scheduled Care Management visit.    Follow Up Plan:  A HIPAA compliant phone message was left for the patient providing contact information and requesting a return call.    Juanell Fairly [facility] Population Health RN Care Manager Direct Dial: [phone]  Fax: [phone] Website: [email or web address]

## 2023-09-09 ENCOUNTER — Other Ambulatory Visit: Payer: Medicare HMO

## 2023-09-09 ENCOUNTER — Other Ambulatory Visit: Payer: Self-pay

## 2023-09-09 NOTE — Patient Instructions (Addendum)
 Thank you for allowing the Care Management team to participate in your care.    We will follow up on September 14, 2023 at 0930. Please do not hesitate to contact me if you require assistance prior to our next outreach.    Juanell Fairly [facility] Population Health RN Care Manager Direct Dial: [phone]  Fax: [phone] Website: [email or web address]

## 2023-09-09 NOTE — Patient Outreach (Signed)
  Care Management   Visit Note  09/09/2023 Name: Jerry [name] MRN: [id number] DOB: 1970-08-13  Subjective: Jerry [name] is a 53 y.o. year old male who is a primary care patient of Jerry Cory, MD. Engaged with Jerry [name] spouse/caregiver via telephone today.  Assessment:  Review of patient past medical history, allergies, medications, health status, including review of consultants reports, laboratory and other test data, was performed as part of  evaluation and provision of care management services.   Outpatient Encounter Medications as of 09/09/2023  Medication Sig Note   cetirizine HCl (ZYRTEC) 5 MG/5ML SOLN Take 10 mLs (10 mg total) by mouth at bedtime.    glycopyrrolate (ROBINUL) 1 MG tablet Place 1 tablet (1 mg total) into feeding tube 2 (two) times daily as needed.    ipratropium-albuterol (DUONEB) 0.5-2.5 (3) MG/3ML SOLN Take 3 mLs by nebulization every 4 (four) hours as needed.    polyethylene glycol powder (GLYCOLAX/MIRALAX) 17 GM/SCOOP powder Place 17 g into feeding tube daily. Titrate as needed for constipation    Polyvinyl Alcohol-Povidone (CLEAR EYES NATURAL TEARS) 5-6 MG/ML SOLN Apply 1 drop to eye 3 (three) times daily as needed.    Potassium Chloride 40 MEQ/15ML (20%) SOLN TAKE 15 ML BY MOUTH TWICE DAILY 09/01/2023: Currently taking once a day   PRESCRIPTION MEDICATION Home TPN    scopolamine (TRANSDERM-SCOP) 1 MG/3DAYS PLACE 1 PATCH ONTO THE SKIN EVERY 3 DAYS    triamcinolone cream (KENALOG) 0.1 % Apply 1 Application topically 2 (two) times daily.    No facility-administered encounter medications on file as of 09/09/2023.    Interventions:  Goals      Effective Management of ALS     Current Barriers:  Care Management support and education needs related to ALS Transportation barriers   Interventions Today    Flowsheet Row Most Recent Value  Chronic Disease   Chronic disease during today's visit Other  [ALS and Malnutrition]  General Interventions   General  Interventions Discussed/Reviewed General Interventions Reviewed, Labs, Doctor Visits, Jerry [name], Communication with  Jerry [name] staff/Team in the home at time of call]  Labs --  [Potassium/Current level is 3.8]  Communication with --  [Jerry [name]/[employer]]  Exercise Interventions   Exercise Discussed/Reviewed Assistive device use and maintanence  Education Interventions   Education Provided Provided Education  Provided Verbal Education On Nutrition, Labs, Medication, When to see the doctor, Insurance Plans  Labs Reviewed --  [Potassium Level]  Mental Health Interventions   Mental Health Discussed/Reviewed Coping Strategies  Nutrition Interventions   Nutrition Discussed/Reviewed Nutrition Reviewed  [Continues to receive TPN via PICC]  Pharmacy Interventions   Pharmacy Dicussed/Reviewed Medications and their functions  Safety Interventions   Safety Discussed/Reviewed Safety Reviewed        Symptom Management Administer all medications as prescribed Continue administering TPN as prescribed. Follow instructions for PICC flushing  Monitor for s/sx of central line infection and notify provider as recommended Contact provider or care management team with questions and new concerns as needed.          PLAN Jerry [name] spouse agreed to follow up next week.    Jerry [name] [facility] Population Health RN Care Manager Direct Dial: [phone]  Fax: [phone] Website: [email or web address]

## 2023-09-14 ENCOUNTER — Telehealth: Payer: Self-pay

## 2023-09-14 ENCOUNTER — Other Ambulatory Visit: Payer: Self-pay

## 2023-09-14 NOTE — Patient Outreach (Signed)
 Care Management   Visit Note  09/14/2023 Name: Jerry [name] MRN: [id number] DOB: 15-Sep-1970  Subjective: Jerry [name] is a 53 y.o. year old male who is a primary care patient of [name], MD. Engaged with Jerry [name] spouse/caregiver via telephone today.   Assessment:  Review of patient past medical history, allergies, medications, health status, including review of consultants reports, laboratory and other test data, was performed as part of  evaluation and provision of care management services.   Outpatient Encounter Medications as of 09/14/2023  Medication Sig Note   cetirizine HCl (ZYRTEC) 5 MG/5ML SOLN Take 10 mLs (10 mg total) by mouth at bedtime.    glycopyrrolate (ROBINUL) 1 MG tablet Place 1 tablet (1 mg total) into feeding tube 2 (two) times daily as needed.    ipratropium-albuterol (DUONEB) 0.5-2.5 (3) MG/3ML SOLN Take 3 mLs by nebulization every 4 (four) hours as needed.    polyethylene glycol powder (GLYCOLAX/MIRALAX) 17 GM/SCOOP powder Place 17 g into feeding tube daily. Titrate as needed for constipation    Polyvinyl Alcohol-Povidone (CLEAR EYES NATURAL TEARS) 5-6 MG/ML SOLN Apply 1 drop to eye 3 (three) times daily as needed.    Potassium Chloride 40 MEQ/15ML (20%) SOLN TAKE 15 ML BY MOUTH TWICE DAILY 09/01/2023: Currently taking once a day   PRESCRIPTION MEDICATION Home TPN    scopolamine (TRANSDERM-SCOP) 1 MG/3DAYS PLACE 1 PATCH ONTO THE SKIN EVERY 3 DAYS    triamcinolone cream (KENALOG) 0.1 % Apply 1 Application topically 2 (two) times daily.    No facility-administered encounter medications on file as of 09/14/2023.     Interventions:  Goals      Effective Management of ALS     Current Barriers:  Care Management support and education needs related to ALS Transportation barriers  Interventions Today    Flowsheet Row Most Recent Value  Chronic Disease   Chronic disease during today's visit Other  [ALS and Hypokalemia]  General Interventions   General  Interventions Discussed/Reviewed General Interventions Reviewed, Labs, [facility], Doctor Visits, Communication with  [employer] team. Per message from [place], the lab was unable to run all requested labs due to blood specimen being collected in the wrong tube. Message relayed to [facility] Team. Requested follow up regarding plan for redraw.]  Labs --  [Potassium]  Doctor Visits Discussed/Reviewed Doctor Visits Reviewed  Communication with --  [facility] Team regarding labs]  Exercise Interventions   Exercise Discussed/Reviewed Assistive device use and maintanence  Education Interventions   Education Provided Provided Education  Provided Verbal Education On Nutrition, Mental Health/Coping with Illness, Medication, When to see the doctor, [employer] Reviewed --  [Reviewed last labs form 09/11/23. [facility] team indicated they were not able to obtain complete metabolic panel due to home lab team obtaining specimen in the wrong tube. [facility] team will follow up with patient to obtain a new sample.]  Mental Health Interventions   Mental Health Discussed/Reviewed Coping Strategies  Nutrition Interventions   Nutrition Discussed/Reviewed Nutrition Reviewed  [Continues to receive total parenteral nutrition with plan to transition to tube feedings]  Pharmacy Interventions   Pharmacy Dicussed/Reviewed Pharmacy Topics Reviewed, Medications and their functions  Safety Interventions   Safety Discussed/Reviewed Safety Reviewed        Symptom Management Administer all medications as prescribed Continue administering TPN as prescribed. Follow instructions for PICC flushing  Monitor for s/sx of central line infection and notify provider as recommended Contact provider or care management team with questions and new concerns as  needed.        PLAN Jerry [name] is scheduled for a home visit with [facility] team today. His spouse/caregiver agreed to a follow up call within  the next week.    Juanell Fairly [facility] Population Health RN Care Manager Direct Dial: [phone]  Fax: [phone] Website: [email or web address]

## 2023-09-14 NOTE — Patient Outreach (Signed)
  Care Management   Outreach Note  09/14/2023 Name: Jerry [name] MRN: [id number] DOB: February 28, 1971  An unsuccessful outreach attempt was made today for a scheduled Care Management visit.   Follow Up Plan:  A HIPAA compliant phone message was left for the patient's spouse/caregiver providing contact information and requesting a return call.     Juanell Fairly [facility] Population Health RN Care Manager Direct Dial: [phone]  Fax: [phone] Website: [email or web address]

## 2023-09-14 NOTE — Patient Instructions (Addendum)
 Thank you for allowing the Care Management team to participate in your care!  We will follow up on September 21, 2023 at 0915. Please do not hesitate to contact me if you require assistance prior to our next outreach.    Juanell Fairly [facility] Population Health RN Care Manager Direct Dial: [phone]  Fax: [phone] Website: [email or web address]

## 2023-09-16 ENCOUNTER — Emergency Department (HOSPITAL_COMMUNITY)
Admission: EM | Admit: 2023-09-16 | Discharge: 2023-09-16 | Disposition: A | Discharge status: Home / Self Care | Attending: Emergency Medicine | Admitting: Emergency Medicine

## 2023-09-16 ENCOUNTER — Encounter (HOSPITAL_COMMUNITY): Payer: Self-pay

## 2023-09-16 ENCOUNTER — Emergency Department (HOSPITAL_COMMUNITY)

## 2023-09-16 DIAGNOSIS — R0981 Nasal congestion: Secondary | ICD-10-CM | POA: Insufficient documentation

## 2023-09-16 DIAGNOSIS — J988 Other specified respiratory disorders: Secondary | ICD-10-CM

## 2023-09-16 DIAGNOSIS — K5900 Constipation, unspecified: Secondary | ICD-10-CM | POA: Diagnosis not present

## 2023-09-16 DIAGNOSIS — R140 Abdominal distension (gaseous): Secondary | ICD-10-CM | POA: Diagnosis present

## 2023-09-16 DIAGNOSIS — R059 Cough, unspecified: Secondary | ICD-10-CM | POA: Insufficient documentation

## 2023-09-16 DIAGNOSIS — K529 Noninfective gastroenteritis and colitis, unspecified: Secondary | ICD-10-CM

## 2023-09-16 DIAGNOSIS — R0602 Shortness of breath: Secondary | ICD-10-CM | POA: Diagnosis present

## 2023-09-16 LAB — CBC WITH DIFFERENTIAL/PLATELET
Abs Immature Granulocytes: 0.04 10*3/uL (ref 0.00–0.07)
Basophils Absolute: 0 10*3/uL (ref 0.0–0.1)
Basophils Relative: 1 %
Eosinophils Absolute: 0.1 10*3/uL (ref 0.0–0.5)
Eosinophils Relative: 1 %
HCT: 37.4 % — ABNORMAL LOW (ref 39.0–52.0)
Hemoglobin: 11.6 g/dL — ABNORMAL LOW (ref 13.0–17.0)
Immature Granulocytes: 1 %
Lymphocytes Relative: 17 %
Lymphs Abs: 1.1 10*3/uL (ref 0.7–4.0)
MCH: 27.4 pg (ref 26.0–34.0)
MCHC: 31 g/dL (ref 30.0–36.0)
MCV: 88.2 fL (ref 80.0–100.0)
Monocytes Absolute: 0.4 10*3/uL (ref 0.1–1.0)
Monocytes Relative: 6 %
Neutro Abs: 4.9 10*3/uL (ref 1.7–7.7)
Neutrophils Relative %: 74 %
Platelets: 125 10*3/uL — ABNORMAL LOW (ref 150–400)
RBC: 4.24 MIL/uL (ref 4.22–5.81)
RDW: 17.2 % — ABNORMAL HIGH (ref 11.5–15.5)
WBC: 6.5 10*3/uL (ref 4.0–10.5)
nRBC: 0 % (ref 0.0–0.2)

## 2023-09-16 LAB — COMPREHENSIVE METABOLIC PANEL
ALT: 150 U/L — ABNORMAL HIGH (ref 0–44)
AST: 110 U/L — ABNORMAL HIGH (ref 15–41)
Albumin: 3.8 g/dL (ref 3.5–5.0)
Alkaline Phosphatase: 258 U/L — ABNORMAL HIGH (ref 38–126)
Anion gap: 10 (ref 5–15)
BUN: 14 mg/dL (ref 6–20)
CO2: 19 mmol/L — ABNORMAL LOW (ref 22–32)
Calcium: 9.6 mg/dL (ref 8.9–10.3)
Chloride: 107 mmol/L (ref 98–111)
Creatinine, Ser: <0.3 mg/dL — ABNORMAL LOW (ref 0.61–1.24)
Glucose, Bld: 158 mg/dL — ABNORMAL HIGH (ref 70–99)
Potassium: 4 mmol/L (ref 3.5–5.1)
Sodium: 136 mmol/L (ref 135–145)
Total Bilirubin: 1.9 mg/dL — ABNORMAL HIGH (ref 0.0–1.2)
Total Protein: 8.5 g/dL — ABNORMAL HIGH (ref 6.5–8.1)

## 2023-09-16 LAB — RESP PANEL BY RT-PCR (RSV, FLU A&B, COVID)  RVPGX2
Influenza A by PCR: NEGATIVE
Influenza B by PCR: NEGATIVE
Resp Syncytial Virus by PCR: NEGATIVE
SARS Coronavirus 2 by RT PCR: NEGATIVE

## 2023-09-16 LAB — I-STAT CG4 LACTIC ACID, ED: Lactic Acid, Venous: 1.1 mmol/L (ref 0.5–1.9)

## 2023-09-16 LAB — LIPASE, BLOOD: Lipase: 20 U/L (ref 11–51)

## 2023-09-16 MED ORDER — IOHEXOL 350 MG/ML SOLN
75.0000 mL | Freq: Once | INTRAVENOUS | Status: AC | PRN
  Administered 2023-09-16: 75 mL via INTRAVENOUS

## 2023-09-16 MED ORDER — AMOXICILLIN-POT CLAVULANATE 875-125 MG PO TABS: 1.0000 | ORAL_TABLET | Freq: Two times a day (BID) | ORAL | 0 refills | Status: AC

## 2023-09-16 NOTE — ED Notes (Signed)
 Patient transported to CT, remains on his personal vent, NAD noted. Pt alert

## 2023-09-16 NOTE — ED Provider Notes (Signed)
 [place] EMERGENCY DEPARTMENT AT [facility] Provider Note   CSN: [id number] Arrival date & time: 09/16/23  1624     History  Chief Complaint  Patient presents with   oropharyngeal secretions    Jerry [name] is a 53 y.o. male.  HPI  Patient is accompanied by his wife who assist him in communication alongside with communication device that tracks his eye movements.  This morning, he was in his usual state of health.  However, all of a sudden, he felt like he could not breathe as well.  He feel like he had more secretions than normal that needed suctioning.  His wife states that she was able to suction around the mouth and into the trach without much relief per the patient.  She notes that usually this happens, the ventilator will be that he needs some relief, though did not this time.  Because he was not getting better with continued suctioning, she called EMS.  He endorses cough and congestion after coming in contact with his nurse who had a cold over the last week. No fevers. No nausea or vomiting. He has had more constipation and bloating than normal. No problems with urination. He recently has his trach changed on Saturday without any issues.  He has not missed any of his medications, particularly his scopolamine patch and Robinul tablet.    Home Medications Prior to Admission medications   Medication Sig Start Date End Date Taking? Authorizing Provider  amoxicillin-clavulanate (AUGMENTIN) 875-125 MG tablet Place 1 tablet into feeding tube 2 (two) times daily for 7 days. 09/16/23 09/23/23 Yes Vikkie Goeden, Earvin Hansen, MD  cetirizine HCl (ZYRTEC) 5 MG/5ML SOLN Take 10 mLs (10 mg total) by mouth at bedtime. 07/27/23   [name], MD  glycopyrrolate (ROBINUL) 1 MG tablet Place 1 tablet (1 mg total) into feeding tube 2 (two) times daily as needed. 07/27/23   [name], MD  ipratropium-albuterol (DUONEB) 0.5-2.5 (3) MG/3ML SOLN Take 3 mLs by nebulization every 4 (four) hours as  needed. 07/27/23   [name], MD  polyethylene glycol powder (GLYCOLAX/MIRALAX) 17 GM/SCOOP powder Place 17 g into feeding tube daily. Titrate as needed for constipation 07/27/23   [name], MD  Polyvinyl Alcohol-Povidone (CLEAR EYES NATURAL TEARS) 5-6 MG/ML SOLN Apply 1 drop to eye 3 (three) times daily as needed. 07/27/23   [name], MD  Potassium Chloride 40 MEQ/15ML (20%) SOLN TAKE 15 ML BY MOUTH TWICE DAILY 08/26/23   [name], MD  PRESCRIPTION MEDICATION Home TPN    [provider]  scopolamine (TRANSDERM-SCOP) 1 MG/3DAYS PLACE 1 PATCH ONTO THE SKIN EVERY 3 DAYS 08/18/23   [name], MD  triamcinolone cream (KENALOG) 0.1 % Apply 1 Application topically 2 (two) times daily. 07/27/23   [name], MD      Allergies    Parke Simmers allergy]    Review of Systems   Review of Systems  Physical Exam Updated Vital Signs BP (!) 141/94   Pulse 88   Temp 98.1 F (36.7 C) (Temporal)   Resp 16   Ht 5\' 5"  (1.651 m)   Wt 66 kg   SpO2 100%   BMI 24.21 kg/m  Physical Exam Constitutional:      General: He is not in acute distress.    Appearance: He is not ill-appearing.  HENT:     Head: Normocephalic and atraumatic.     Mouth/Throat:     Mouth: Mucous membranes are moist.  Eyes:  Extraocular Movements: Extraocular movements intact.     Conjunctiva/sclera: Conjunctivae normal.  Cardiovascular:     Rate and Rhythm: Normal rate and regular rhythm.     Pulses: Normal pulses.     Heart sounds: Normal heart sounds.  Pulmonary:     Effort: Pulmonary effort is normal.     Comments: Diffuse coarse breath sounds throughout Abdominal:     General: Bowel sounds are normal. There is distension.     Palpations: Abdomen is soft.     Tenderness: There is no abdominal tenderness.  Musculoskeletal:     Comments: BLE contractures at the feet  Skin:    General: Skin is warm and dry.     Capillary Refill: Capillary refill takes less than 2 seconds.   Neurological:     Mental Status: He is alert and oriented to person, place, and time. Mental status is at baseline.  Psychiatric:        Mood and Affect: Mood normal.        Behavior: Behavior normal.     ED Results / Procedures / Treatments   Labs (all labs ordered are listed, but only abnormal results are displayed) Labs Reviewed  CBC WITH DIFFERENTIAL/PLATELET - Abnormal; Notable for the following components:      Result Value   Hemoglobin 11.6 (*)    HCT 37.4 (*)    RDW 17.2 (*)    Platelets 125 (*)    All other components within normal limits  COMPREHENSIVE METABOLIC PANEL - Abnormal; Notable for the following components:   CO2 19 (*)    Glucose, Bld 158 (*)    Creatinine, Ser <0.30 (*)    Total Protein 8.5 (*)    AST 110 (*)    ALT 150 (*)    Alkaline Phosphatase 258 (*)    Total Bilirubin 1.9 (*)    All other components within normal limits  RESP PANEL BY RT-PCR (RSV, FLU A&B, COVID)  RVPGX2  LIPASE, BLOOD  I-STAT CG4 LACTIC ACID, ED    EKG EKG Interpretation Date/Time:  Friday September 16 2023 20:50:50 EDT Ventricular Rate:  87 PR Interval:  148 QRS Duration:  93 QT Interval:  345 QTC Calculation: 418 R Axis:   -5  Text Interpretation: Sinus rhythm Ventricular premature complex Aberrant conduction of SV complex(es) Borderline T wave abnormalities whe compared to prior, more artifact No STEMI Confirmed by Theda Belfast ([id number]) on 09/16/2023 10:42:50 PM  Radiology CT ABDOMEN PELVIS W CONTRAST Result Date: 09/16/2023 CLINICAL DATA:  Bowel obstruction, abdominal distension EXAM: CT ABDOMEN AND PELVIS WITH CONTRAST TECHNIQUE: Multidetector CT imaging of the abdomen and pelvis was performed using the standard protocol following bolus administration of intravenous contrast. RADIATION DOSE REDUCTION: This exam was performed according to the departmental dose-optimization program which includes automated exposure control, adjustment of the mA and/or kV according to  patient size and/or use of iterative reconstruction technique. CONTRAST:  75mL OMNIPAQUE IOHEXOL 350 MG/ML SOLN COMPARISON:  09/16/2023, 06/05/2023 FINDINGS: Lower chest: Chronic elevation of the left hemidiaphragm. There is a trace left pleural effusion, with compressive atelectasis in the left lower lobe. Hepatobiliary: No focal liver abnormality is seen. No gallstones, gallbladder wall thickening, or biliary dilatation. Pancreas: Unremarkable. No pancreatic ductal dilatation or surrounding inflammatory changes. Spleen: Normal in size without focal abnormality. Adrenals/Urinary Tract: Bilateral nonobstructing renal calculi, measuring up to 7 mm on the right and 4 mm on the left. No obstructive uropathy within either kidney. The adrenals and bladder are unremarkable. Stomach/Bowel: No  bowel obstruction or ileus. Normal appendix right lower quadrant. There is wall thickening of the distal rectosigmoid colon consistent with colitis. Percutaneous gastrostomy tube within the gastric lumen. Vascular/Lymphatic: No significant vascular findings are present. No enlarged abdominal or pelvic lymph nodes. Reproductive: Prostate is unremarkable. Other: No free fluid or free intraperitoneal gas. No abdominal wall hernia. Musculoskeletal: No acute or destructive bony abnormalities. Reconstructed images demonstrate no additional findings. IMPRESSION: 1. Wall thickening of the distal rectosigmoid colon consistent with colitis. No bowel obstruction or ileus. 2. Bilateral nonobstructing renal calculi. 3. Trace left pleural effusion. 4. Chronic elevation of the left hemidiaphragm, with minimal left lower lobe compressive atelectasis. Electronically Signed   By: Sharlet Salina M.D.   On: 09/16/2023 22:07   DG Chest Portable 1 View Result Date: 09/16/2023 CLINICAL DATA:  SOB, secretions EXAM: PORTABLE CHEST 1 VIEW COMPARISON:  Chest x-ray 06/05/2023, CT chest 06/05/2023 FINDINGS: Tracheostomy with tip terminating 3.5 cm above the  carina. Right chest wall Port-A-Cath with tip overlying the right atrium. Left PICC with tip overlying the right atrium. The heart and mediastinal contours are unchanged. Low lung volumes. No focal consolidation. No pulmonary edema. No pleural effusion. No pneumothorax. No acute osseous abnormality. IMPRESSION: 1. Low lung volumes with no active disease. 2. Lines and tubes as above. Electronically Signed   By: Tish Frederickson M.D.   On: 09/16/2023 19:21    Procedures Procedures    Medications Ordered in ED Medications  iohexol (OMNIPAQUE) 350 MG/ML injection 75 mL (75 mLs Intravenous Contrast Given 09/16/23 2042)    ED Course/ Medical Decision Making/ A&P                                 Medical Decision Making Amount and/or Complexity of Data Reviewed Labs: ordered. Radiology: ordered.  Risk Prescription drug management.   53 year old male with a history of ALS who is ventilator, tracheostomy, and gastrostomy tube dependent here with acute increase in oropharyngeal secretions.  Vitals reassuring.  Exam notable for coarse breath sounds throughout the likely transmitted the upper airway given his tracheostomy.  Trach and G-tube sites without evidence of purulence or overt infection.  Differential includes viral URI, pneumonia, bacterial tracheitis, progressive autonomic dysfunction, pulmonary embolism, ACS.  Will collect CBC with differential, CMP, respiratory panel, chest x-ray, and EKG.  While patient's abdomen is distended, it is not tender and is without peritoneal signs. However, with his comorbid conditions and report of decreased stool output, will proceed with CTAP.  CBC without leukocytosis and overall close to baseline.  Lactic acid normal.  He does have an elevation in his LFTs; however, this seems to be a more chronic problem for him given elevations in the past.  CXR with low lung volumes but no acute infiltrate. CT with colitis but without obstruction or ileus.  On  reassessment, patient's secretions are thinning. He continues to have normal saturations on RA. He is in good spirits. Discussed treatment for colitis and potential evolving respiratory infection. After shared decision making and considering admission, patient and family decided to complete outpatient therapy with Augmentin BID for 7 days given increased risk of obtaining more severe infections while in the hospital. Discussed following up with PCP soon to ensure he is doing well on the antibiotics and to return to ED if worsening before then. Patient and wife appreciative.        Final Clinical Impression(s) / ED Diagnoses Final diagnoses:  Colitis  Congestion of upper airway    Rx / DC Orders ED Discharge Orders          Ordered    amoxicillin-clavulanate (AUGMENTIN) 875-125 MG tablet  2 times daily        09/16/23 2302              Evette Georges, MD 09/16/23 2306    Tegeler, Canary Brim, MD 09/20/23 1228

## 2023-09-16 NOTE — ED Notes (Signed)
 Pt return from CT scanner with RT, NAD noted, pt remains alert.

## 2023-09-16 NOTE — ED Triage Notes (Signed)
 Patient BIB [facility] from home for over-salivation that is making him feel like he can't breathe. Patient is gassy, a little constipated, and hypertensive as well. Patient has trach, was going to start abx today for irritation and yellow sputum, but didn't b/c EMS arrived. Patient on home vent, no supplemental O2 needed.

## 2023-09-16 NOTE — ED Notes (Signed)
 [facility] has been called to transport pt back to his residence, wife at bedside. AVS and ABX reviewed with wife who verbalized understanding.

## 2023-09-16 NOTE — ED Notes (Signed)
 [facility] Lab tech Misty Stanley to help assist with getting labs for pt as he is a very difficulty stick PIV 22g will not pull back but flushes with ease.

## 2023-09-16 NOTE — Discharge Instructions (Signed)
 You had increased oral secretions that could be due to a viral infection. Your CT also showed some inflammation of the colon. To cover for potential infection of both of these areas, I have sent in Augmentin to take twice a day for 7 days. Be sure to follow up with your primary care doctor to ensure you are getting better on this medication. Come back to the emergency room if your symptoms worsen before then.

## 2023-09-21 ENCOUNTER — Other Ambulatory Visit: Payer: Self-pay

## 2023-09-24 LAB — LAB REPORT - SCANNED: EGFR: 156

## 2023-09-26 ENCOUNTER — Encounter: Payer: Self-pay | Admitting: Family Medicine

## 2023-10-01 LAB — LAB REPORT - SCANNED: EGFR: 154

## 2023-10-03 ENCOUNTER — Telehealth: Payer: Self-pay

## 2023-10-03 NOTE — Telephone Encounter (Signed)
 Copied from CRM [id number]. Topic: Clinical - Medical Advice >> Oct 03, 2023  9:35 AM Patsy Lager T wrote: Reason for CRM: Megan nurse case mgr from [facility] home care called stated wife wanted orders changed to 2x a day and PRN to change the dressing around patients trach area as he has green drainage that wife says provider is aware of. Aundra Millet will fax over the order request

## 2023-10-04 ENCOUNTER — Other Ambulatory Visit: Payer: Self-pay

## 2023-10-07 ENCOUNTER — Encounter: Payer: Self-pay | Admitting: Family Medicine

## 2023-10-08 LAB — LAB REPORT - SCANNED: EGFR: 149

## 2023-10-10 ENCOUNTER — Encounter: Payer: Self-pay | Admitting: Family Medicine

## 2023-10-11 ENCOUNTER — Other Ambulatory Visit: Payer: Self-pay

## 2023-10-11 NOTE — Patient Outreach (Signed)
 Complex Care Management   Visit Note  10/11/2023  Name:  Jerry [name] MRN: [id number] DOB: Sep 19, 1970  Situation: Referral received for Complex Care Management related to Amyotrophic Lateral Sclerosis. I obtained verbal consent from Mr. Neva Seat and spouse Gevena Cotton. .  Visit completed via telephone.  Background:   Past Medical History:  Diagnosis Date   ALS (amyotrophic lateral sclerosis) (HCC)    COVID-19 virus infection 06/2020   Meningitis 2003   spinal   Morbid obesity (HCC)    Neuromuscular disorder (HCC)    Prediabetes 11/05/2016   A1C 5.7 on 11/05/16   Type 2 diabetes mellitus (HCC) 08/31/2023    Assessment: Patient Reported Symptoms:  Cognitive Alert and oriented to person, place, and time, Other: (Requires Eyegaze device to communicate)  Neurological No symptoms reported (No acute changes)    HEENT No symptoms reported    Cardiovascular No symptoms reported    Respiratory No symptoms reported    Endocrine No symptoms reported    Gastrointestinal No symptoms reported    Genitourinary Incontinence    Integumentary No symptoms reported    Musculoskeletal Other Inability to use upper or lower extremities due to ALS progression  Psychosocial No symptoms reported     Vitals:   10/11/23 1011  SpO2: 99%    Medications Reviewed Today     Reviewed by Juanell Fairly, RN (Registered Nurse) on 10/11/23 at 1018  Med List Status: <None>   Medication Order Taking? Sig Documenting Provider Last Dose Status Informant  cetirizine HCl (ZYRTEC) 5 MG/5ML SOLN [id number]  Take 10 mLs (10 mg total) by mouth at bedtime. [name], MD  Active   glycopyrrolate (ROBINUL) 1 MG tablet [id number]  Place 1 tablet (1 mg total) into feeding tube 2 (two) times daily as needed. [name], MD  Active   ipratropium-albuterol (DUONEB) 0.5-2.5 (3) MG/3ML SOLN [id number]  Take 3 mLs by nebulization every 4 (four) hours as needed. [name], MD  Active   polyethylene glycol powder  (GLYCOLAX/MIRALAX) 17 GM/SCOOP powder [id number]  Place 17 g into feeding tube daily. Titrate as needed for constipation [name], MD  Active   Polyvinyl Alcohol-Povidone (CLEAR EYES NATURAL TEARS) 5-6 MG/ML SOLN [id number]  Apply 1 drop to eye 3 (three) times daily as needed. [name], MD  Active   Potassium Chloride 40 MEQ/15ML (20%) SOLN [id number]  TAKE 15 ML BY MOUTH TWICE DAILY [name], MD  Active            Med Note Constance Haw, [facility] N   Thu Sep 01, 2023  5:04 PM) Currently taking once a day  PRESCRIPTION MEDICATION [id number] No Home TPN [provider] Taking Active Care Giver  scopolamine (TRANSDERM-SCOP) 1 MG/3DAYS [id number]  PLACE 1 PATCH ONTO THE SKIN EVERY 3 DAYS Sowles, Danna Hefty, MD  Active   triamcinolone cream (KENALOG) 0.1 % [id number]  Apply 1 Application topically 2 (two) times daily. [name], MD  Active   Med List Note Ed Blalock, CPhT 06/23/22 2355): Pt has ALS and is non-verbal.  He communicates through a computer screen at bedside. Wife handles his meds.            Recommendation:   Continue completing in-home and virtual provider visits as scheduled.  Follow Up Plan:   Mr. Gladu spouse Gevena Cotton agreed to follow up call within the next month.    Juanell Fairly [facility] Population Health RN Care Manager Direct Dial: [phone]  Fax: [phone] Website: [email or web address]

## 2023-10-11 NOTE — Patient Instructions (Signed)
 Visit Information  Thank you for allowing the  Care Management team to participate in your care.  Your next care management appointment is by telephone on October 17, 2023 at 1015.   Please do not hesitate to contact me if you require assistance prior to our next outreach.   Juanell Fairly [facility] Population Health RN Care Manager Direct Dial: [phone]  Fax: [phone] Website: [email or web address]

## 2023-10-17 ENCOUNTER — Other Ambulatory Visit: Payer: Self-pay

## 2023-10-17 LAB — LAB REPORT - SCANNED: EGFR: 147

## 2023-10-17 NOTE — Patient Instructions (Addendum)
 Thank you for allowing the Care Management team to participate in your care.   Our next outreach is scheduled for Nov 08, 2023 at 0930. Please do not hesitate to contact me if you require assistance prior to our next outreach.    Roxie Cord [facility] Population Health RN Care Manager Direct Dial: [phone]  Fax: [phone] Website: [email or web address]

## 2023-10-17 NOTE — Patient Outreach (Signed)
 Complex Care Management   Visit Note  10/17/2023  Name:  Jerry [name] MRN: [id number] DOB: 11-28-1970  Situation: Referral received for Complex Care Management related to  ALS  I obtained verbal consent from Patient.  Visit completed with patient's spouse/caregiver via telephone.    Background:   Past Medical History:  Diagnosis Date   ALS (amyotrophic lateral sclerosis) (HCC)    COVID-19 virus infection 06/2020   Meningitis 2003   spinal   Morbid obesity (HCC)    Neuromuscular disorder (HCC)    Prediabetes 11/05/2016   A1C 5.7 on 11/05/16   Type 2 diabetes mellitus (HCC) 08/31/2023     Assessment: Patient Reported Symptoms:  Cognitive Cognitive Status: Alert and oriented to person, place, and time, Other: (Per caregiver/spouse. Patient remains alert and oriented. Utilizes an Dealer  device to communicate. No acute changes in cognitive status)   Healing Pattern: Unsure Health Facilitated by: Prayer/meditation  Neurological   Neurological Conditions:  (Advanced Amyotrophic Later Sclerosis) Neurological Management Strategies: Coping strategies, Medical device, Medication therapy, Routine screening Neurological Comment: Patient unable to self-manage care/Requires full care  HEENT HEENT Symptoms Reported: No symptoms reported HEENT Comment: No acute symptoms reported    Cardiovascular Cardiovascular Symptoms Reported: No symptoms reported Does patient have uncontrolled Hypertension?: No Cardiovascular Comment: No symptoms reported  Respiratory Respiratory Symptoms Reported: No symptoms reported (No acute symptoms reported) Respiratory Conditions: Tracheostomy (Patient with advanced ALS and has a tracheostomy. He is ventilator dependent) Respiratory Comment: Patient is ventilator dependent. Care being provided by spouse and in-home nursing staff  Endocrine Patient reports the following symptoms related to hypoglycemia or hyperglycemia : No symptoms reported Endocrine Comment: No  symptoms reported  Gastrointestinal Gastrointestinal Symptoms Reported: No symptoms reported (No acute symptoms reported) Gastrointestinal Conditions: Other Other Gastrointestinal Conditions: Requires total parenteral nutrition via PICC Gastrointestinal Management Strategies: Incontinence garment/pad Gastrointestinal Comment: Patient requires total care/No acute symptoms reported Nutrition Risk Screen (CP): Tube feeding or parenteral nutrition  Genitourinary   Genitourinary Conditions: Incontinence Genitourinary Management Strategies: Incontinence garment/pad Genitourinary Comment: Patient requires total care due to advanced ALS. Requires condom catheters.  Integumentary Integumentary Symptoms Reported: No symptoms reported Skin Management Strategies: Routine screening Skin Comment: No acute symptoms reported  Musculoskeletal Musculoskelatal Symptoms Reviewed: Other Other Musculoskeletal Symptoms: Patient with advanced ALS. Unable to use upper or lower extremities/Requires total care Musculoskeletal Conditions: Other Other Musculoskeletal Conditions: Patient is bedbound due to advanced ALS Musculoskeletal Management Strategies: Coping strategies Musculoskeletal Comment: Patient requires full care due to advanced ALS/Inability to ambulate. Unable to use upper or lower extremities Falls in the past year?: Exclusion - non ambulatory    Psychosocial Psychosocial Symptoms Reported: No symptoms reported Behavioral Management Strategies: Support system Behavioral Health Self-Management Outcome: 4 (good) Behavioral Health Comment: No new/acute symptoms reported Major Change/Loss/Stressor/Fears (CP): Denies Quality of Family Relationships: supportive Do you feel physically threatened by others?: No         10/17/2023   11:22 AM  Depression screen PHQ 2/9  Decreased Interest 0  Down, Depressed, Hopeless 0  PHQ - 2 Score 0    There were no vitals filed for this visit.  Medications  Reviewed Today     Reviewed by Juanell Fairly, RN (Registered Nurse) on 10/17/23 at 1041  Med List Status: <None>   Medication Order Taking? Sig Documenting Provider Last Dose Status Informant  cetirizine HCl (ZYRTEC) 5 MG/5ML SOLN [id number]  Take 10 mLs (10 mg total) by mouth at bedtime. [name], MD  Active   glycopyrrolate (  ROBINUL) 1 MG tablet [id number]  Place 1 tablet (1 mg total) into feeding tube 2 (two) times daily as needed. Sowles, Krichna, MD  Active   ipratropium-albuterol (DUONEB) 0.5-2.5 (3) MG/3ML SOLN [id number]  Take 3 mLs by nebulization every 4 (four) hours as needed. Sowles, Krichna, MD  Active   polyethylene glycol powder (GLYCOLAX/MIRALAX) 17 GM/SCOOP powder [id number]  Place 17 g into feeding tube daily. Titrate as needed for constipation Sowles, Krichna, MD  Active   Polyvinyl Alcohol-Povidone (CLEAR EYES NATURAL TEARS) 5-6 MG/ML SOLN [id number]  Apply 1 drop to eye 3 (three) times daily as needed. Sowles, Krichna, MD  Active   Potassium Chloride 40 MEQ/15ML (20%) SOLN [id number]  TAKE 15 ML BY MOUTH TWICE DAILY Sowles, Krichna, MD  Active            Med Note Dessa Floss, [facility]   Thu Sep 01, 2023  5:04 PM) Currently taking once a day  PRESCRIPTION MEDICATION [id number] No Home TPN [provider] Taking Active Care Giver  scopolamine (TRANSDERM-SCOP) 1 MG/3DAYS [id number]  PLACE 1 PATCH ONTO THE SKIN EVERY 3 DAYS Sowles, Krichna, MD  Active   triamcinolone cream (KENALOG) 0.1 % [id number]  Apply 1 Application topically 2 (two) times daily. Sowles, Krichna, MD  Active   Med List Note Tyson Gals, CPhT 06/23/22 5366): Pt has ALS and is non-verbal.  He communicates through a computer screen at bedside. Wife handles his meds.            Recommendation:   Keep scheduled home visits with the [facility] team   Follow Up Plan:   Telephone follow up appointment scheduled for May.    Roxie Cord [facility] Population Health RN Care Manager Direct  Dial: [phone]  Fax: [phone] Website: [email or web address]

## 2023-10-22 LAB — LAB REPORT - SCANNED: EGFR: 150

## 2023-10-28 LAB — LAB REPORT - SCANNED: EGFR: 152

## 2023-11-04 ENCOUNTER — Other Ambulatory Visit: Payer: Self-pay | Admitting: Family Medicine

## 2023-11-04 DIAGNOSIS — G1221 Amyotrophic lateral sclerosis: Secondary | ICD-10-CM

## 2023-11-06 LAB — LAB REPORT - SCANNED: EGFR: 160

## 2023-11-08 ENCOUNTER — Other Ambulatory Visit: Payer: Self-pay

## 2023-11-08 NOTE — Patient Outreach (Unsigned)
 Complex Care Management   Visit Note  11/08/2023  Name:  Jerry [name] MRN: [id number] DOB: Jul 25, 1970  Situation: Referral received for Complex Care Management related to  Amyotrophic Lateral Sclerosis.  I obtained verbal consent from Patient and spouse.  Visit completed with Jerry [name] spouse via telephone.  Background:   Past Medical History:  Diagnosis Date   ALS (amyotrophic lateral sclerosis) (HCC)    COVID-19 virus infection 06/2020   Meningitis 2003   spinal   Morbid obesity (HCC)    Neuromuscular disorder (HCC)    Prediabetes 11/05/2016   A1C 5.7 on 11/05/16   Type 2 diabetes mellitus (HCC) 08/31/2023    Assessment: Patient Reported Symptoms:  Cognitive        Neurological      HEENT        Cardiovascular      Respiratory      Endocrine      Gastrointestinal        Genitourinary      Integumentary      Musculoskeletal          Psychosocial              10/17/2023   11:22 AM  Depression screen PHQ 2/9  Decreased Interest 0  Down, Depressed, Hopeless 0  PHQ - 2 Score 0    There were no vitals filed for this visit.  Medications Reviewed Today     Reviewed by Roxie Cord, RN (Registered Nurse) on 11/08/23 at 0901  Med List Status: <None>   Medication Order Taking? Sig Documenting Provider Last Dose Status Informant  cetirizine  HCl (ZYRTEC ) 5 MG/5ML SOLN [id number]  Take 10 mLs (10 mg total) by mouth at bedtime. Sowles, Krichna, MD  Active   glycopyrrolate  (ROBINUL ) 1 MG tablet [id number]  Place 1 tablet (1 mg total) into feeding tube 2 (two) times daily as needed. Sowles, Krichna, MD  Active   ipratropium-albuterol  (DUONEB) 0.5-2.5 (3) MG/3ML SOLN [id number]  USE 3 ML VIA NEBULIZER EVERY 4 HOURS AS NEEDED Sowles, Krichna, MD  Active   polyethylene glycol powder (GLYCOLAX /MIRALAX ) 17 GM/SCOOP powder [id number]  Place 17 g into feeding tube daily. Titrate as needed for constipation Sowles, Krichna, MD  Active   Polyvinyl Alcohol -Povidone  (CLEAR EYES NATURAL TEARS) 5-6 MG/ML SOLN [id number]  Apply 1 drop to eye 3 (three) times daily as needed. Sowles, Krichna, MD  Active   Potassium Chloride  40 MEQ/15ML (20%) SOLN [id number]  TAKE 15 ML BY MOUTH TWICE DAILY Sowles, Krichna, MD  Active            Med Note Dessa Floss, [facility] N   Thu Sep 01, 2023  5:04 PM) Currently taking once a day  PRESCRIPTION MEDICATION [id number] No Home TPN [provider] Taking Active Care Giver  scopolamine  (TRANSDERM-SCOP) 1 MG/3DAYS [id number]  PLACE 1 PATCH ONTO THE SKIN EVERY 3 DAYS Sowles, Krichna, MD  Active   triamcinolone  cream (KENALOG ) 0.1 % [id number]  Apply 1 Application topically 2 (two) times daily. Sowles, Krichna, MD  Active   Med List Note Tyson Gals, CPhT 06/23/22 7425): Pt has ALS and is non-verbal.  He communicates through a computer screen at bedside. Wife handles his meds.            Recommendation:   {RECOMMENDATONS:32554}  Follow Up Plan:   {FOLLOWUP:32559}  SIG ***

## 2023-11-09 NOTE — Patient Instructions (Signed)
 Thank you for allowing the Complex Care Management team to participate in your care.   We will follow up on Nov 15, 2023 at 0945. Please do not hesitate to contact me if you require assistance prior to our next scheduled outreach.    Roxie Cord [facility] Population Health RN Care Manager Direct Dial: [phone]  Fax: [phone] Website: [email or web address]

## 2023-11-12 LAB — LAB REPORT - SCANNED: EGFR (African American): 160

## 2023-11-15 ENCOUNTER — Other Ambulatory Visit: Payer: Self-pay

## 2023-11-15 NOTE — Patient Outreach (Signed)
 Complex Care Management   Visit Note  11/15/2023  Name:  Jerry [name] MRN: [id number] DOB: 25-Jan-1971  Situation: Referral received for Complex Care Management related to Advanced ALS I obtained verbal consent from Patient and spouse/caregiver.  Visit completed with Jerry [name] and his spouse/caregiver via telephone today.  Primary concern today was continuation of Private Duty Nursing services. Reports receiving a letter on Nov 14, 2023 indicating Private Duty Nursing services were not approved for renewal. They have been advised to submit an appeal before the May 19th deadline. Collaborated the [facility] [name], [facility] and [facility] today regarding documentation that will be required to support the appeal.   Background:   Past Medical History:  Diagnosis Date   ALS (amyotrophic lateral sclerosis) (HCC)    COVID-19 virus infection 06/2020   Meningitis 2003   spinal   Morbid obesity (HCC)    Neuromuscular disorder (HCC)    Prediabetes 11/05/2016   A1C 5.7 on 11/05/16   Type 2 diabetes mellitus (HCC) 08/31/2023    Assessment: Patient Reported Symptoms: Cognitive Cognitive Status: Alert and oriented to person, place, and time Cognitive/Intellectual Conditions Management [RPT]:  (Patient is non verbal and requires eyegaze device. No documented cognitive delays.) Health Maintenance Behaviors: Spiritual practice(s), Stress management Healing Pattern: Slow Health Facilitated by: Stress management, Prayer/meditation  Neurological Neurological Review of Symptoms:  (Patient bedridden with Advanced ALS. No acute changes) Neurological Conditions:  (Advanced ALS) Neurological Management Strategies: Medication therapy, Routine screening, Medical device  HEENT HEENT Symptoms Reported: No symptoms reported  Cardiovascular Cardiovascular Symptoms Reported: No symptoms reported Cardiovascular Comment: No Symptoms Reported  Respiratory Respiratory Symptoms Reported:   (Patient is tracheostomy and ventilator dependent. No acute changes.)  Endocrine Patient reports the following symptoms related to hypoglycemia or hyperglycemia : No symptoms reported  Gastrointestinal Gastrointestinal Symptoms Reported: Incontinence (Patient receiving Total Parenteral Nutrition via PICC. Also has gastrostomy tube.)  Genitourinary Genitourinary Symptoms Reported: Incontinence (Incontinence due to Advanced ALS)  Integumentary Integumentary Symptoms Reported: No symptoms reported  Musculoskeletal Musculoskelatal Symptoms Reviewed: Other Other Musculoskeletal Symptoms: Patient is bedbound due to Advanced ALS  Psychosocial Psychosocial Symptoms Reported: No symptoms reported Quality of Family Relationships: supportive Do you feel physically threatened by others?: No      11/15/2023    9:57 AM  Depression screen PHQ 2/9  Decreased Interest 0  Down, Depressed, Hopeless 0  PHQ - 2 Score 0    There were no vitals filed for this visit.  Medications Reviewed Today     Reviewed by Roxie Cord, RN (Registered Nurse) on 11/15/23 at [phone]  Med List Status: <None>   Medication Order Taking? Sig Documenting Provider Last Dose Status Informant  cetirizine  HCl (ZYRTEC ) 5 MG/5ML SOLN [id number]  Take 10 mLs (10 mg total) by mouth at bedtime. Sowles, Krichna, MD  Active   glycopyrrolate  (ROBINUL ) 1 MG tablet [id number]  Place 1 tablet (1 mg total) into feeding tube 2 (two) times daily as needed. Sowles, Krichna, MD  Active   ipratropium-albuterol  (DUONEB) 0.5-2.5 (3) MG/3ML SOLN [id number]  USE 3 ML VIA NEBULIZER EVERY 4 HOURS AS NEEDED Sowles, Krichna, MD  Active   polyethylene glycol powder (GLYCOLAX /MIRALAX ) 17 GM/SCOOP powder [id number]  Place 17 g into feeding tube daily. Titrate as needed for constipation Sowles, Krichna, MD  Active   Polyvinyl Alcohol -Povidone (CLEAR EYES NATURAL TEARS) 5-6 MG/ML SOLN [id number]  Apply 1 drop to eye 3 (three) times daily as needed. Sowles, Krichna, MD   Active   Potassium Chloride   40 MEQ/15ML (20%) SOLN [id number] No TAKE 15 ML BY MOUTH TWICE DAILY  Patient not taking: Reported on 11/09/2023   Sowles, Krichna, MD Not Taking Active            Med Note [facility], [facility]   Wed Nov 09, 2023  7:59 AM)    PRESCRIPTION MEDICATION [id number] No Home TPN [provider] Taking Active Care Giver  scopolamine  (TRANSDERM-SCOP) 1 MG/3DAYS [id number]  PLACE 1 PATCH ONTO THE SKIN EVERY 3 DAYS Sowles, Krichna, MD  Active   triamcinolone  cream (KENALOG ) 0.1 % [id number]  Apply 1 Application topically 2 (two) times daily. Sowles, Krichna, MD  Active   Med List Note Tyson Gals, CPhT 06/23/22 3474): Pt has ALS and is non-verbal.  He communicates through a computer screen at bedside. Wife handles his meds.            Recommendation:   Complete virtual and in home provider visits as scheduled  Follow Up Plan:   Telephone follow up with Nurse Case Manager on Nov 22 2023   [facility] Population Health RN Care Manager Direct Dial: [phone]  Fax: [phone] Website: [email or web address]

## 2023-11-15 NOTE — Patient Instructions (Addendum)
 Thank you for allowing the Complex Care Management team to participate in your care. It was great speaking with you today!  Reminders: Please be sure to submit the appeal for Private Duty Nursing prior to the deadline of Nov 21, 2023.  We are scheduled to complete another outreach on Nov 22, 2023 at 0945. We will likely follow up sooner once calls are returned from [facility] and your assigned [name] Case Worker.  Please do not hesitate to contact me if you receive additional information regarding changes to your current in-home services.    Roxie Cord [facility] Population Health RN Care Manager Direct Dial: [phone]  Fax: [phone] Website: [email or web address]

## 2023-11-17 ENCOUNTER — Telehealth: Payer: Self-pay

## 2023-11-17 NOTE — Patient Outreach (Signed)
 Attempted outreach with Mr. Schnitzer assigned [name] Case Worker, Minette Amabile to discuss recent notification regarding denial of Private Duty Nursing service. Left contact information requesting a return call.   Roxie Cord [facility] Population Health RN Care Manager Direct Dial: [phone]  Fax: [phone] Website: [email or web address]

## 2023-11-18 ENCOUNTER — Other Ambulatory Visit: Payer: Self-pay

## 2023-11-18 NOTE — Patient Outreach (Signed)
 Complex Care Management   Visit Note  11/18/2023  Name:  Jerry [name] MRN: [id number] DOB: 1971-06-27  Situation: Referral received for Complex Care Management related to ALS I obtained verbal consent from Patient.  Visit completed with Jerry [name] and his spouse/caregiver via telephone.  Background:   Past Medical History:  Diagnosis Date   ALS (amyotrophic lateral sclerosis) (HCC)    COVID-19 virus infection 06/2020   Meningitis 2003   spinal   Morbid obesity (HCC)    Neuromuscular disorder (HCC)    Prediabetes 11/05/2016   A1C 5.7 on 11/05/16   Type 2 diabetes mellitus (HCC) 08/31/2023     Assessment: Patient Reported Symptoms: Cognitive Cognitive Status: Alert and oriented to person, place, and time Cognitive/Intellectual Conditions Management [RPT]: Other (No Cognitive Delays. Patient is nonverbal and communicates using an Eyegaze device)  Neurological Neurological Review of Symptoms: Other: Oher Neurological Symptoms/Conditions [RPT]: Patient is bedridden with Advanced ALS. No acute changes Neurological Conditions:  (Advance ALS)  HEENT HEENT Symptoms Reported: No symptoms reported  Cardiovascular Cardiovascular Symptoms Reported: No symptoms reported  Respiratory Other Respiratory Symptoms: Patient has a tracheostomy and is ventilator dependent. No acute changes  Endocrine Patient reports the following symptoms related to hypoglycemia or hyperglycemia : No symptoms reported  Gastrointestinal Gastrointestinal Symptoms Reported: Incontinence Additional Gastrointestinal Details: Patient receiving  TPN via PICC. Has a gastrostomy tube  Genitourinary Other Genitourinary Symptoms: Incontinence due to Advanced ALS  Integumentary Integumentary Symptoms Reported: No symptoms reported  Musculoskeletal Other Musculoskeletal Symptoms: Patient is bedbound due to Advanced ALS  Psychosocial Psychosocial Symptoms Reported: No symptoms reported Quality of Family Relationships:  supportive Do you feel physically threatened by others?: No      11/15/2023    9:57 AM  Depression screen PHQ 2/9  Decreased Interest 0  Down, Depressed, Hopeless 0  PHQ - 2 Score 0    There were no vitals filed for this visit.  Medications Reviewed Today     Reviewed by Roxie Cord, RN (Registered Nurse) on 11/18/23 at 2234  Med List Status: <None>   Medication Order Taking? Sig Documenting Provider Last Dose Status Informant  cetirizine  HCl (ZYRTEC ) 5 MG/5ML SOLN [id number]  Take 10 mLs (10 mg total) by mouth at bedtime. Sowles, Krichna, MD  Active   glycopyrrolate  (ROBINUL ) 1 MG tablet [id number]  Place 1 tablet (1 mg total) into feeding tube 2 (two) times daily as needed. Sowles, Krichna, MD  Active   ipratropium-albuterol  (DUONEB) 0.5-2.5 (3) MG/3ML SOLN [id number]  USE 3 ML VIA NEBULIZER EVERY 4 HOURS AS NEEDED Sowles, Krichna, MD  Active   polyethylene glycol powder (GLYCOLAX /MIRALAX ) 17 GM/SCOOP powder [id number]  Place 17 g into feeding tube daily. Titrate as needed for constipation Sowles, Krichna, MD  Active   Polyvinyl Alcohol -Povidone (CLEAR EYES NATURAL TEARS) 5-6 MG/ML SOLN [id number]  Apply 1 drop to eye 3 (three) times daily as needed. Sowles, Krichna, MD  Active   Potassium Chloride  40 MEQ/15ML (20%) SOLN [id number] No TAKE 15 ML BY MOUTH TWICE DAILY  Patient not taking: Reported on 11/09/2023   Sowles, Krichna, MD Not Taking Active            Med Note Dessa Floss, [facility] N   Wed Nov 09, 2023  7:59 AM)    PRESCRIPTION MEDICATION [id number] No Home TPN [provider] Taking Active Care Giver  scopolamine  (TRANSDERM-SCOP) 1 MG/3DAYS [id number]  PLACE 1 PATCH ONTO THE SKIN EVERY 3 DAYS Sowles, Krichna, MD  Active   triamcinolone  cream (KENALOG ) 0.1 %  [id number]  Apply 1 Application topically 2 (two) times daily. Sowles, Krichna, MD  Active   Med List Note Tyson Gals, CPhT 06/23/22 0272): Pt has ALS and is non-verbal.  He communicates through a computer screen at  bedside. Wife handles his meds.            Recommendation:   Complete in-home and virtual provider visits as scheduled  Follow Up Plan:   Telephone follow up on Nov 22, 2023   Roxie Cord [facility] RN Care Manager Direct Dial: [phone]  Fax: [phone] Website: [email or web address]

## 2023-11-18 NOTE — Patient Instructions (Addendum)
 Thank you for allowing the Complex Care Management team to participate in your care.   We will follow up on Nov 22, 2023 to further discuss the plan for continuation of your Licensed Private Duty Nursing services. Please do not hesitate to call or contact the clinic if you need urgent assistance before I contact you.    Roxie Cord [facility] Population Health RN Care Manager Direct Dial: [phone]  Fax: [phone] Website: [email or web address]

## 2023-11-22 ENCOUNTER — Other Ambulatory Visit: Payer: Self-pay

## 2023-11-22 NOTE — Patient Outreach (Signed)
 Complex Care Management   Visit Note  11/22/2023  Name:  Jerry [name] MRN: [id number] DOB: [birth year]  Situation: Referral received for Complex Care Management related to ALS I obtained verbal consent from Patient.  Visit completed with Mr. Goodwyn and his spouse/caregiver via telephone.  Background:   Past Medical History:  Diagnosis Date   ALS (amyotrophic lateral sclerosis) (HCC)    COVID-19 virus infection 06/2020   Meningitis 2003   spinal   Morbid obesity (HCC)    Neuromuscular disorder (HCC)    Prediabetes 11/05/2016   A1C 5.7 on 11/05/16   Type 2 diabetes mellitus (HCC) 08/31/2023     Assessment: Patient Reported Symptoms: Cognitive Cognitive Status: Alert and oriented to person, place, and time  Neurological Neurological Review of Symptoms: Other: Oher Neurological Symptoms/Conditions [RPT]: Patient bedridden with Advance ALS. No acute changes  HEENT HEENT Symptoms Reported: No symptoms reported  Cardiovascular Cardiovascular Symptoms Reported: No symptoms reported  Respiratory Respiratory Symptoms Reported: Other: Other Respiratory Symptoms: Patient has a tracheostomy and is ventilator dependent. No acute changes  Endocrine Patient reports the following symptoms related to hypoglycemia or hyperglycemia : No symptoms reported  Gastrointestinal Gastrointestinal Symptoms Reported: Incontinence Additional Gastrointestinal Details: Patient receiving TPN via PICC. Also has gastrostomy tube  Genitourinary Genitourinary Symptoms Reported: Incontinence Other Genitourinary Symptoms: Incontinence due to Advance ALS  Integumentary Integumentary Symptoms Reported: No symptoms reported  Musculoskeletal Musculoskelatal Symptoms Reviewed: Other Other Musculoskeletal Symptoms: Patient is bedbound due to Advanced ALS  Psychosocial Psychosocial Symptoms Reported: No symptoms reported      11/22/2023    9:45 AM  Depression screen PHQ 2/9  Decreased Interest 0  Down, Depressed,  Hopeless 0  PHQ - 2 Score 0    There were no vitals filed for this visit.  Medications Reviewed Today     Reviewed by Roxie Cord, RN (Registered Nurse) on 11/22/23 at 2346  Med List Status: <None>   Medication Order Taking? Sig Documenting Provider Last Dose Status Informant  cetirizine  HCl (ZYRTEC ) 5 MG/5ML SOLN [id number]  Take 10 mLs (10 mg total) by mouth at bedtime. Sowles, Krichna, MD  Active   glycopyrrolate  (ROBINUL ) 1 MG tablet [id number]  Place 1 tablet (1 mg total) into feeding tube 2 (two) times daily as needed. Sowles, Krichna, MD  Active   ipratropium-albuterol  (DUONEB) 0.5-2.5 (3) MG/3ML SOLN [id number]  USE 3 ML VIA NEBULIZER EVERY 4 HOURS AS NEEDED Sowles, Krichna, MD  Active   polyethylene glycol powder (GLYCOLAX /MIRALAX ) 17 GM/SCOOP powder [id number]  Place 17 g into feeding tube daily. Titrate as needed for constipation Sowles, Krichna, MD  Active   Polyvinyl Alcohol -Povidone (CLEAR EYES NATURAL TEARS) 5-6 MG/ML SOLN [id number]  Apply 1 drop to eye 3 (three) times daily as needed. Sowles, Krichna, MD  Active   Potassium Chloride  40 MEQ/15ML (20%) SOLN [id number] No TAKE 15 ML BY MOUTH TWICE DAILY  Patient not taking: Reported on 11/09/2023   Sowles, Krichna, MD Not Taking Active            Med Note [facility], [facility] N   Wed Nov 09, 2023  7:59 AM)    PRESCRIPTION MEDICATION [id number] No Home TPN [provider] Taking Active Care Giver  scopolamine  (TRANSDERM-SCOP) 1 MG/3DAYS [id number]  PLACE 1 PATCH ONTO THE SKIN EVERY 3 DAYS Sowles, Krichna, MD  Active   triamcinolone  cream (KENALOG ) 0.1 % [id number]  Apply 1 Application topically 2 (two) times daily. Sowles, Krichna, MD  Active   Med List Note Carolin Chyle, [address], CPhT 06/23/22  1610): Pt has ALS and is non-verbal.  He communicates through a computer screen at bedside. Wife handles his meds.            Recommendation:   Complete in home and virtual provider visits as scheduled  Follow Up Plan:   Conference  Call scheduled for  Nov 23, 2023 regarding continuation of Psychologist, forensic.   Roxie Cord [facility] Population Health RN Care Manager Direct Dial: [phone]  Fax: [phone] Website: [email or web address]

## 2023-11-23 ENCOUNTER — Other Ambulatory Visit: Payer: Self-pay

## 2023-11-23 NOTE — Patient Outreach (Signed)
 Complex Care Management   Visit Note  11/23/2023  Name:  Jerry [name] MRN: [id number] DOB: 1970-12-02  Situation: Referral received for Complex Care Management related to ALS I obtained verbal consent from Patient.  Collaboration visit completed with Jerry [name], Jerry [name] (spouse/caregiver), [place] Office of Administrative Hearings Mediator, [place] Medicaid Reviewer and [facility] Health staff.  Background:   Past Medical History:  Diagnosis Date   ALS (amyotrophic lateral sclerosis) (HCC)    COVID-19 virus infection 06/2020   Meningitis 2003   spinal   Morbid obesity (HCC)    Neuromuscular disorder (HCC)    Prediabetes 11/05/2016   A1C 5.7 on 11/05/16   Type 2 diabetes mellitus (HCC) 08/31/2023     Assessment: Patient Reported Symptoms: Cognitive Cognitive Status: Alert and oriented to person, place, and time (Communicates with Eyegaze Device)  Neurological Neurological Review of Symptoms: Other: Oher Neurological Symptoms/Conditions [RPT]: Patient bedridden with Advanced ALS. No acute changes  HEENT HEENT Symptoms Reported: No symptoms reported  Cardiovascular Cardiovascular Symptoms Reported: No symptoms reported  Respiratory Respiratory Symptoms Reported: Other: Other Respiratory Symptoms: Patient has tracheostomy and is ventilator dependent. No acute changes  Endocrine Patient reports the following symptoms related to hypoglycemia or hyperglycemia : No symptoms reported  Gastrointestinal Additional Gastrointestinal Details: Patient receiving tTPN via PICC. Also has gastrostomy tube  Genitourinary Genitourinary Symptoms Reported: Other, Incontinence Other Genitourinary Symptoms: Incontinence due to Advanced ALS  Integumentary Integumentary Symptoms Reported: No symptoms reported  Musculoskeletal Other Musculoskeletal Symptoms: Patient is bedbound due to Advanced ALS  Psychosocial Psychosocial Symptoms Reported: No symptoms reported Quality of Family Relationships: supportive Do you  feel physically threatened by others?: No        11/15/2023    9:57 AM  Depression screen PHQ 2/9  Decreased Interest 0  Down, Depressed, Hopeless 0  PHQ - 2 Score 0    There were no vitals filed for this visit.  Medications Reviewed Today     Reviewed by Roxie Cord, RN (Registered Nurse) on 11/24/23 at 1216  Med List Status: <None>   Medication Order Taking? Sig Documenting Provider Last Dose Status Informant  cetirizine  HCl (ZYRTEC ) 5 MG/5ML SOLN [id number]  Take 10 mLs (10 mg total) by mouth at bedtime. Sowles, Krichna, MD  Active   glycopyrrolate  (ROBINUL ) 1 MG tablet [id number]  Place 1 tablet (1 mg total) into feeding tube 2 (two) times daily as needed. Sowles, Krichna, MD  Active   ipratropium-albuterol  (DUONEB) 0.5-2.5 (3) MG/3ML SOLN [id number]  USE 3 ML VIA NEBULIZER EVERY 4 HOURS AS NEEDED Sowles, Krichna, MD  Active   polyethylene glycol powder (GLYCOLAX /MIRALAX ) 17 GM/SCOOP powder [id number]  Place 17 g into feeding tube daily. Titrate as needed for constipation Sowles, Krichna, MD  Active   Polyvinyl Alcohol -Povidone (CLEAR EYES NATURAL TEARS) 5-6 MG/ML SOLN [id number]  Apply 1 drop to eye 3 (three) times daily as needed. Sowles, Krichna, MD  Active   Potassium Chloride  40 MEQ/15ML (20%) SOLN [id number] No TAKE 15 ML BY MOUTH TWICE DAILY  Patient not taking: Reported on 11/09/2023   Sowles, Krichna, MD Not Taking Active            Med Note Dessa Floss, [facility] N   Wed Nov 09, 2023  7:59 AM)    PRESCRIPTION MEDICATION [id number] No Home TPN [provider] Taking Active Care Giver  scopolamine  (TRANSDERM-SCOP) 1 MG/3DAYS [id number]  PLACE 1 PATCH ONTO THE SKIN EVERY 3 DAYS Sowles, Krichna, MD  Active   triamcinolone  cream (KENALOG ) 0.1 % [id number]  Apply 1 Application topically 2 (two) times daily. Sowles, Krichna, MD  Active   Med List Note Tyson Gals, CPhT 06/23/22 1610): Pt has ALS and is non-verbal.  He communicates through a computer screen at bedside. Wife  handles his meds.              Goals Addressed             This Visit's Progress    VBCI RNCM:  Conference Call/Mediation for Continuation of Engineering geologist Nursing Services       Problems:  Conference Call/Mediation for Continuation of Private Duty Nursing Services   Goal: Over the next  week patient will receive approval for continuation of Private Duty Nursing Services  Interventions:  Interdisciplinary Collaboration Interventions:  Collaborated with [facility], [place] Office of Administrations Mediator, [place] Medicaid Reviewer along with Jerry [name] and spouse. Collaboration was conducted to complete mediation via conference call regarding request to continue Licensed Private Duty Nursing services. Process was prompted by the family receiving notification on Nov 14, 2023 regarding denial for continued nursing services. Jerry [name] has been diagnosed with Advanced ALS. He has a tracheostomy and is ventilator dependent. He is currently receiving Total Parenteral Nutrition via PICC. He is bed bound and requires full care. Thorough discussion regarding patient's need for services along with the primary agencies, health systems involved in his care.  During call [place] Medicaid reviewer was able to communicate with Medicaid team regarding documentation specific to the skills provided by the Private Duty Nursing staff. The [facility] team was able to resubmit the needed documents for review and approval. Team was able to meet successful resolution and the denial for services was overturned. The  Medicaid Reviewer confirmed that approval to maintain services would be granted through the next re- certification on April 21, 2024. [name] are aware that a formal letter will be mailed regarding approval for services. He is currently approved for 112 hours of Licensed Private Duty Nursing Care per week.    Plan:  -The family will continue to work with Care Management team and designated agencies  to address concerns related to long term health care needs.  -Follow up outreach scheduled for Nov 29, 2023             Recommendation:   Continue in-home and virtual provider visits as scheduled  Follow Up Plan:   Telephone follow up with Nurse Case Manager  on Nov 29, 2023   Roxie Cord [facility] RN Care Manager Direct Dial: [phone]  Fax: [phone] Website: [email or web address]

## 2023-11-23 NOTE — Care Management (Addendum)
 Collaborated with [facility] Case Manager, [place] [employer] regarding plan for continuation of Private Duty Nursing services.   Discussed letter received from CSRS/[place] Department of Health and Human Services regarding plan to discontinue [name] Licensed Private Duty Nursing Services with [facility] if documentation and plan to appeal are not received by Nov 21, 2023.   Ms. Brynn Caras confirmed that the [facility] staff have resubmitted the recertification along with the additional requested documents. [name] has submitted the request to appeal. Ms. Brynn Caras reports she or a [facility] representative will be available to attend a conference call if the family opts to proceed with the CSRS mediation on Nov 23, 2023.   PLAN Will follow up with [name] and his spouse on Nov 18, 2023.    Roxie Cord [facility] Population Health RN Care Manager Direct Dial: [phone]  Fax: [phone] Website: [email or web address]

## 2023-11-24 NOTE — Patient Instructions (Addendum)
 Thank you for allowing the Complex Care Management team to participate in your care. It was great speaking with you today!  We will follow up on Nov 29, 2023 at 0930. Please do not hesitate to contact me if you require assistance prior to our next outreach.    Roxie Cord [facility] Population Health RN Care Manager Direct Dial: [phone]  Fax: [phone] Website: [email or web address]

## 2023-11-29 ENCOUNTER — Other Ambulatory Visit: Payer: Self-pay

## 2023-11-29 NOTE — Patient Outreach (Unsigned)
 Complex Care Management   Visit Note  11/29/2023  Name:  Jerry [name] MRN: [id number] DOB: 1971/05/04  Situation: Referral received for Complex Care Management related to ALS I obtained verbal consent from Patient and Caregiver/Spouse. Visit completed with Jerry [name] and his spouse/caregiver via telephone.  Background:   Past Medical History:  Diagnosis Date   ALS (amyotrophic lateral sclerosis) (HCC)    COVID-19 virus infection 06/2020   Meningitis 2003   spinal   Morbid obesity (HCC)    Neuromuscular disorder (HCC)    Prediabetes 11/05/2016   A1C 5.7 on 11/05/16   Type 2 diabetes mellitus (HCC) 08/31/2023    Assessment: Patient Reported Symptoms:  Cognitive        Neurological      HEENT        Cardiovascular      Respiratory      Endocrine      Gastrointestinal        Genitourinary      Integumentary      Musculoskeletal          Psychosocial       Quality of Family Relationships: supportive      11/22/2023    9:45 AM  Depression screen PHQ 2/9  Decreased Interest 0  Down, Depressed, Hopeless 0  PHQ - 2 Score 0    Vitals:    Medications Reviewed Today     Reviewed by Roxie Cord, RN (Registered Nurse) on 11/29/23 at [phone]  Med List Status: <None>   Medication Order Taking? Sig Documenting Provider Last Dose Status Informant  cetirizine  HCl (ZYRTEC ) 5 MG/5ML SOLN [id number]  Take 10 mLs (10 mg total) by mouth at bedtime. Sowles, Krichna, MD  Active   glycopyrrolate  (ROBINUL ) 1 MG tablet [id number]  Place 1 tablet (1 mg total) into feeding tube 2 (two) times daily as needed. Sowles, Krichna, MD  Active   ipratropium-albuterol  (DUONEB) 0.5-2.5 (3) MG/3ML SOLN [id number]  USE 3 ML VIA NEBULIZER EVERY 4 HOURS AS NEEDED Sowles, Krichna, MD  Active   polyethylene glycol powder (GLYCOLAX /MIRALAX ) 17 GM/SCOOP powder [id number]  Place 17 g into feeding tube daily. Titrate as needed for constipation Sowles, Krichna, MD  Active   Polyvinyl  Alcohol -Povidone (CLEAR EYES NATURAL TEARS) 5-6 MG/ML SOLN [id number]  Apply 1 drop to eye 3 (three) times daily as needed. Sowles, Krichna, MD  Active   Potassium Chloride  40 MEQ/15ML (20%) SOLN [id number] No TAKE 15 ML BY MOUTH TWICE DAILY  Patient not taking: Reported on 11/09/2023   Sowles, Krichna, MD Not Taking Active            Med Note [facility], [facility] N   Wed Nov 09, 2023  7:59 AM)    PRESCRIPTION MEDICATION [id number] No Home TPN [provider] Taking Active Care Giver  scopolamine  (TRANSDERM-SCOP) 1 MG/3DAYS [id number]  PLACE 1 PATCH ONTO THE SKIN EVERY 3 DAYS Sowles, Krichna, MD  Active   triamcinolone  cream (KENALOG ) 0.1 % [id number]  Apply 1 Application topically 2 (two) times daily. Sowles, Krichna, MD  Active   Med List Note Tyson Gals, CPhT 06/23/22 3220): Pt has ALS and is non-verbal.  He communicates through a computer screen at bedside. Wife handles his meds.            Recommendation:   {RECOMMENDATONS:32554}  Follow Up Plan:   {FOLLOWUP:32559}  SIG ***

## 2023-11-29 NOTE — Patient Instructions (Signed)
 Thank you for allowing the Complex Care Management team to participate in your care.

## 2023-12-03 LAB — LAB REPORT - SCANNED: EGFR: 144

## 2023-12-05 ENCOUNTER — Encounter: Payer: Self-pay | Admitting: Family Medicine

## 2023-12-05 ENCOUNTER — Other Ambulatory Visit

## 2023-12-05 NOTE — Patient Outreach (Unsigned)
 Complex Care Management   Visit Note  12/05/2023  Name:  Jerry [name] MRN: [id number] DOB: [birth year]  Situation: Referral received for Complex Care Management related to {Criteria:32550} I obtained verbal consent from {CHL AMB Patient/Caregiver:28184}.  Visit completed with ***  {VISIT LOCATION:32553}  Background:   Past Medical History:  Diagnosis Date   ALS (amyotrophic lateral sclerosis) (HCC)    COVID-19 virus infection 06/2020   Meningitis 2003   spinal   Morbid obesity (HCC)    Neuromuscular disorder (HCC)    Prediabetes 11/05/2016   A1C 5.7 on 11/05/16   Type 2 diabetes mellitus (HCC) 08/31/2023    Assessment: Patient Reported Symptoms:  Cognitive        Neurological      HEENT        Cardiovascular      Respiratory      Endocrine      Gastrointestinal        Genitourinary      Integumentary      Musculoskeletal          Psychosocial       Quality of Family Relationships: supportive Do you feel physically threatened by others?:  (Outreach with spouse/caregiver)      11/29/2023    9:50 AM  Depression screen PHQ 2/9  Decreased Interest 0  Down, Depressed, Hopeless 0  PHQ - 2 Score 0    Vitals:    Medications Reviewed Today     Reviewed by Roxie Cord, RN (Registered Nurse) on 12/05/23 at 1521  Med List Status: <None>   Medication Order Taking? Sig Documenting Provider Last Dose Status Informant  cetirizine  HCl (ZYRTEC ) 5 MG/5ML SOLN [id number]  Take 10 mLs (10 mg total) by mouth at bedtime. Sowles, Krichna, MD  Active   glycopyrrolate  (ROBINUL ) 1 MG tablet [id number]  Place 1 tablet (1 mg total) into feeding tube 2 (two) times daily as needed. Sowles, Krichna, MD  Active   ipratropium-albuterol  (DUONEB) 0.5-2.5 (3) MG/3ML SOLN [id number]  USE 3 ML VIA NEBULIZER EVERY 4 HOURS AS NEEDED Sowles, Krichna, MD  Active   polyethylene glycol powder (GLYCOLAX /MIRALAX ) 17 GM/SCOOP powder [id number]  Place 17 g into feeding tube daily. Titrate as  needed for constipation Sowles, Krichna, MD  Active   Polyvinyl Alcohol -Povidone (CLEAR EYES NATURAL TEARS) 5-6 MG/ML SOLN [id number]  Apply 1 drop to eye 3 (three) times daily as needed. Sowles, Krichna, MD  Active   Potassium Chloride  40 MEQ/15ML (20%) SOLN [id number] No TAKE 15 ML BY MOUTH TWICE DAILY  Patient not taking: Reported on 11/09/2023   Sowles, Krichna, MD Not Taking Active            Med Note [facility], [facility] N   Wed Nov 09, 2023  7:59 AM)    PRESCRIPTION MEDICATION [id number] No Home TPN [provider] Taking Active Care Giver  scopolamine  (TRANSDERM-SCOP) 1 MG/3DAYS [id number]  PLACE 1 PATCH ONTO THE SKIN EVERY 3 DAYS Sowles, Krichna, MD  Active   triamcinolone  cream (KENALOG ) 0.1 % [id number]  Apply 1 Application topically 2 (two) times daily. Sowles, Krichna, MD  Active   Med List Note Tyson Gals, CPhT 06/23/22 8841): Pt has ALS and is non-verbal.  He communicates through a computer screen at bedside. Wife handles his meds.            Recommendation:   {RECOMMENDATONS:32554}  Follow Up Plan:   {FOLLOWUP:32559}  SIG ***

## 2023-12-05 NOTE — Patient Instructions (Signed)
Thank you for allowing the Chronic Care Management team to participate in your care.  

## 2023-12-10 LAB — LAB REPORT - SCANNED: EGFR: 171

## 2023-12-16 ENCOUNTER — Other Ambulatory Visit: Payer: Self-pay

## 2023-12-19 NOTE — Patient Outreach (Signed)
 Encounter Error: Call received from Home Agency. Patient or spouse  not available.

## 2023-12-20 ENCOUNTER — Other Ambulatory Visit: Payer: Self-pay

## 2023-12-20 ENCOUNTER — Encounter: Payer: Self-pay | Admitting: Family Medicine

## 2023-12-20 LAB — LAB REPORT - SCANNED: EGFR: 135

## 2023-12-20 NOTE — Patient Outreach (Signed)
 Complex Care Management   Visit Note  12/20/2023  Name:  Jerry [name] MRN: [id number] DOB: 12/19/1970  Situation: Referral received for Complex Care Management related to Amyotrophic Lateral Sclerosis. I obtained verbal consent from Caregiver.  Visit completed with patient's caregiver/spouse via telephone today.  Background:   Past Medical History:  Diagnosis Date   ALS (amyotrophic lateral sclerosis) (HCC)    COVID-19 virus infection 06/2020   Meningitis 2003   spinal   Morbid obesity (HCC)    Neuromuscular disorder (HCC)    Prediabetes 11/05/2016   A1C 5.7 on 11/05/16   Type 2 diabetes mellitus (HCC) 08/31/2023    Assessment: Patient Reported Symptoms: Cognitive Cognitive Status: Unable to Assess (Outreach with patient's spouse/caregiver.)  Neurological Neurological Review of Symptoms: Other: [place] Neurological Symptoms/Conditions [RPT]: Patient is bedridden with Advanced  ALS. No acute changes  HEENT HEENT Symptoms Reported: No symptoms reported  Cardiovascular Cardiovascular Symptoms Reported: No symptoms reported  Respiratory Respiratory Symptoms Reported: Other: Other Respiratory Symptoms: Patient has tracheostomy and is ventilator dependent. Spouse/caregiver denies acute changes  Endocrine Patient reports the following symptoms related to hypoglycemia or hyperglycemia : No symptoms reported  Gastrointestinal Gastrointestinal Symptoms Reported: Incontinence Additional Gastrointestinal Details: Patient receiving TPN via PICC. Also has gastrostomy tube  Genitourinary Genitourinary Symptoms Reported: Incontinence Other Genitourinary Symptoms: Incontinence due to Advanced ALS  Integumentary Integumentary Symptoms Reported: No symptoms reported  Musculoskeletal Musculoskelatal Symptoms Reviewed: Other Other Musculoskeletal Symptoms: Patient is bedbound due to Advanced ALS  Psychosocial Psychosocial Symptoms Reported: No symptoms reported (Per spouse, patient has not complained of  changes) Quality of Family Relationships: supportive Do you feel physically threatened by others?:  (Outreach with patient's spouse/caregiver)      12/20/2023   10:09 AM  Depression screen PHQ 2/9  Decreased Interest 0  Down, Depressed, Hopeless 0  PHQ - 2 Score 0    There were no vitals filed for this visit.  Medications Reviewed Today     Reviewed by Roxie Cord, RN (Registered Nurse) on 12/20/23 at 1000  Med List Status: <None>   Medication Order Taking? Sig Documenting Provider Last Dose Status Informant  cetirizine  HCl (ZYRTEC ) 5 MG/5ML SOLN [id number]  Take 10 mLs (10 mg total) by mouth at bedtime. Sowles, Krichna, MD  Active   glycopyrrolate  (ROBINUL ) 1 MG tablet [id number]  Place 1 tablet (1 mg total) into feeding tube 2 (two) times daily as needed. Sowles, Krichna, MD  Active   ipratropium-albuterol  (DUONEB) 0.5-2.5 (3) MG/3ML SOLN [id number]  USE 3 ML VIA NEBULIZER EVERY 4 HOURS AS NEEDED Sowles, Krichna, MD  Active   polyethylene glycol powder (GLYCOLAX /MIRALAX ) 17 GM/SCOOP powder [id number]  Place 17 g into feeding tube daily. Titrate as needed for constipation Sowles, Krichna, MD  Active   Polyvinyl Alcohol -Povidone (CLEAR EYES NATURAL TEARS) 5-6 MG/ML SOLN [id number]  Apply 1 drop to eye 3 (three) times daily as needed. Sowles, Krichna, MD  Active   Potassium Chloride  40 MEQ/15ML (20%) SOLN [id number] No TAKE 15 ML BY MOUTH TWICE DAILY  Patient not taking: Reported on 11/09/2023   Sowles, Krichna, MD Not Taking Active            Med Note Dessa Floss, [facility]   Wed Nov 09, 2023  7:59 AM)    PRESCRIPTION MEDICATION [id number] No Home TPN [provider] Taking Active Care Giver  scopolamine  (TRANSDERM-SCOP) 1 MG/3DAYS [id number]  PLACE 1 PATCH ONTO THE SKIN EVERY 3 DAYS Sowles, Krichna, MD  Active   triamcinolone  cream (KENALOG ) 0.1 %  [id number]  Apply 1 Application topically 2 (two) times daily. Sowles, Krichna, MD  Active   Med List Note Tyson Gals, CPhT 06/23/22  7829): Pt has ALS and is non-verbal.  He communicates through a computer screen at bedside. Wife handles his meds.            Recommendation:   Complete home visit with Dr. Marline Simons on December 26, 2023 Complete virtual visit with Dr. Ava Lei on January 24, 2024 Continue Current Plan of Care with [facility] and [facility]   Follow Up Plan:   Telephone follow up appointment with Nurse Case Manager on January 18, 2024   Roxie Cord [facility] RN Care Manager Direct Dial: [phone]  Fax: [phone] Website: [email or web address]

## 2023-12-21 NOTE — Patient Instructions (Signed)
 Thank you for allowing the Complex Care Management team to participate in your care!  We will follow up on January 18, 2024 at 0945. Please do not hesitate to contact me if you require assistance prior to our next outreach.    Roxie Cord [facility] Population Health RN Care Manager Direct Dial: [phone]  Fax: [phone] Website: [email or web address]

## 2023-12-24 LAB — LAB REPORT - SCANNED
A1c: 5.3
EGFR: 156

## 2024-01-06 LAB — LAB REPORT - SCANNED: EGFR: 159

## 2024-01-15 LAB — LAB REPORT - SCANNED: EGFR: 153

## 2024-01-18 ENCOUNTER — Other Ambulatory Visit: Payer: Self-pay

## 2024-01-18 NOTE — Patient Outreach (Unsigned)
 Complex Care Management   Visit Note  01/18/2024  Name:  Jerry [name] MRN: [id number] DOB: 08-Feb-1971  Situation: Referral received for Complex Care Management related to {Criteria:32550} I obtained verbal consent from {CHL AMB Patient/Caregiver:[id number]}.  Visit completed with ***  {VISIT LOCATION:32553}  Background:   Past Medical History:  Diagnosis Date   ALS (amyotrophic lateral sclerosis) (HCC)    COVID-19 virus infection 06/2020   Meningitis 2003   spinal   Morbid obesity (HCC)    Neuromuscular disorder (HCC)    Prediabetes 11/05/2016   A1C 5.7 on 11/05/16   Type 2 diabetes mellitus (HCC) 08/31/2023    Assessment: Patient Reported Symptoms:  Cognitive        Neurological      HEENT        Cardiovascular      Respiratory      Endocrine      Gastrointestinal        Genitourinary      Integumentary      Musculoskeletal          Psychosocial              12/20/2023   10:09 AM  Depression screen PHQ 2/9  Decreased Interest 0  Down, Depressed, Hopeless 0  PHQ - 2 Score 0    There were no vitals filed for this visit.  Medications Reviewed Today     Reviewed by Karoline Lima, RN (Registered Nurse) on 01/18/24 at 2203  Med List Status: <None>   Medication Order Taking? Sig Documenting Provider Last Dose Status Informant  cetirizine  HCl (ZYRTEC ) 5 MG/5ML SOLN [id number]  Take 10 mLs (10 mg total) by mouth at bedtime. Sowles, Krichna, MD  Active   glycopyrrolate  (ROBINUL ) 1 MG tablet [id number]  Place 1 tablet (1 mg total) into feeding tube 2 (two) times daily as needed. Sowles, Krichna, MD  Active   ipratropium-albuterol  (DUONEB) 0.5-2.5 (3) MG/3ML SOLN [id number]  USE 3 ML VIA NEBULIZER EVERY 4 HOURS AS NEEDED Sowles, Krichna, MD  Active   polyethylene glycol powder (GLYCOLAX /MIRALAX ) 17 GM/SCOOP powder [id number]  Place 17 g into feeding tube daily. Titrate as needed for constipation Sowles, Krichna, MD  Active   Polyvinyl Alcohol -Povidone (CLEAR EYES  NATURAL TEARS) 5-6 MG/ML SOLN [id number]  Apply 1 drop to eye 3 (three) times daily as needed. Sowles, Krichna, MD  Active   Potassium Chloride  40 MEQ/15ML (20%) SOLN [id number] No TAKE 15 ML BY MOUTH TWICE DAILY  Patient not taking: Reported on 11/09/2023   Sowles, Krichna, MD Not Taking Active            Med Note [facility], [facility]   Wed Nov 09, 2023  7:59 AM)    PRESCRIPTION MEDICATION [id number] No Home TPN [provider] Taking Active Care Giver  scopolamine  (TRANSDERM-SCOP) 1 MG/3DAYS [id number]  PLACE 1 PATCH ONTO THE SKIN EVERY 3 DAYS Sowles, Krichna, MD  Active   triamcinolone  cream (KENALOG ) 0.1 % [id number]  Apply 1 Application topically 2 (two) times daily. Sowles, Krichna, MD  Active   Med List Note Lauralyn Neth, CPhT 06/23/22 9070): Pt has ALS and is non-verbal.  He communicates through a computer screen at bedside. Wife handles his meds.            Recommendation:   {RECOMMENDATONS:32554}  Follow Up Plan:   {FOLLOWUP:32559}  SIG ***

## 2024-01-20 NOTE — Patient Instructions (Addendum)
 Thank you for allowing the Complex Care Management team to participate in your care!  I will continue to collaborate with the [facility] team and your in-home providers regarding plan for transitioning from TPN to tube feedings.  We will follow up on January 23, 2024 at 1130. Please don't hesitate to contact me if you require assistance prior to our outreach.    Jackson Acron [facility] Population Health RN Care Manager Direct Dial: [phone]  Fax: [phone] Website: [email or web address]

## 2024-01-21 LAB — LAB REPORT - SCANNED: EGFR: 157

## 2024-01-23 ENCOUNTER — Other Ambulatory Visit: Payer: Self-pay

## 2024-01-23 ENCOUNTER — Encounter: Payer: Self-pay | Admitting: Family Medicine

## 2024-01-23 NOTE — Patient Outreach (Unsigned)
 Complex Care Management   Visit Note  01/23/2024  Name:  Jerry [name] MRN: [id number] DOB: [birth year]  Situation: Referral received for Complex Care Management related to {Criteria:32550} I obtained verbal consent from {CHL AMB Patient/Caregiver:[id number]}.  Visit completed with ***  {VISIT LOCATION:32553}  Background:   Past Medical History:  Diagnosis Date   ALS (amyotrophic lateral sclerosis) (HCC)    COVID-19 virus infection 06/2020   Meningitis 2003   spinal   Morbid obesity (HCC)    Neuromuscular disorder (HCC)    Prediabetes 11/05/2016   A1C 5.7 on 11/05/16   Type 2 diabetes mellitus (HCC) 08/31/2023    Assessment: Patient Reported Symptoms:  Cognitive        Neurological      HEENT        Cardiovascular      Respiratory      Endocrine      Gastrointestinal        Genitourinary      Integumentary      Musculoskeletal          Psychosocial              12/20/2023   10:09 AM  Depression screen PHQ 2/9  Decreased Interest 0  Down, Depressed, Hopeless 0  PHQ - 2 Score 0    There were no vitals filed for this visit.  Medications Reviewed Today     Reviewed by Karoline Lima, RN (Registered Nurse) on 01/23/24 at 1138  Med List Status: <None>   Medication Order Taking? Sig Documenting Provider Last Dose Status Informant  cetirizine  HCl (ZYRTEC ) 5 MG/5ML SOLN [id number]  Take 10 mLs (10 mg total) by mouth at bedtime. Sowles, Krichna, MD  Active   glycopyrrolate  (ROBINUL ) 1 MG tablet [id number]  Place 1 tablet (1 mg total) into feeding tube 2 (two) times daily as needed. Sowles, Krichna, MD  Active   ipratropium-albuterol  (DUONEB) 0.5-2.5 (3) MG/3ML SOLN [id number]  USE 3 ML VIA NEBULIZER EVERY 4 HOURS AS NEEDED Sowles, Krichna, MD  Active   polyethylene glycol powder (GLYCOLAX /MIRALAX ) 17 GM/SCOOP powder [id number]  Place 17 g into feeding tube daily. Titrate as needed for constipation Sowles, Krichna, MD  Active   Polyvinyl Alcohol -Povidone (CLEAR EYES  NATURAL TEARS) 5-6 MG/ML SOLN [id number]  Apply 1 drop to eye 3 (three) times daily as needed. Sowles, Krichna, MD  Active   Potassium Chloride  40 MEQ/15ML (20%) SOLN [id number] No TAKE 15 ML BY MOUTH TWICE DAILY  Patient not taking: Reported on 11/09/2023   Sowles, Krichna, MD Not Taking Active            Med Note [facility], [facility]   Wed Nov 09, 2023  7:59 AM)    PRESCRIPTION MEDICATION [id number] No Home TPN [provider] Taking Active Care Giver  scopolamine  (TRANSDERM-SCOP) 1 MG/3DAYS [id number]  PLACE 1 PATCH ONTO THE SKIN EVERY 3 DAYS Sowles, Krichna, MD  Active   triamcinolone  cream (KENALOG ) 0.1 % [id number]  Apply 1 Application topically 2 (two) times daily. Sowles, Krichna, MD  Active   Med List Note Lauralyn Neth, CPhT 06/23/22 9070): Pt has ALS and is non-verbal.  He communicates through a computer screen at bedside. Wife handles his meds.            Recommendation:   {RECOMMENDATONS:32554}  Follow Up Plan:   {FOLLOWUP:32559}  SIG ***

## 2024-01-23 NOTE — Patient Instructions (Signed)
Thank you for allowing the Chronic Care Management team to participate in your care.  

## 2024-01-24 ENCOUNTER — Telehealth (INDEPENDENT_AMBULATORY_CARE_PROVIDER_SITE_OTHER): Payer: Self-pay | Admitting: Family Medicine

## 2024-01-24 ENCOUNTER — Encounter: Payer: Self-pay | Admitting: Family Medicine

## 2024-01-24 DIAGNOSIS — J9611 Chronic respiratory failure with hypoxia: Secondary | ICD-10-CM | POA: Diagnosis not present

## 2024-01-24 DIAGNOSIS — K5909 Other constipation: Secondary | ICD-10-CM

## 2024-01-24 DIAGNOSIS — Z9911 Dependence on respirator [ventilator] status: Secondary | ICD-10-CM

## 2024-01-24 DIAGNOSIS — E43 Unspecified severe protein-calorie malnutrition: Secondary | ICD-10-CM

## 2024-01-24 DIAGNOSIS — R7989 Other specified abnormal findings of blood chemistry: Secondary | ICD-10-CM | POA: Insufficient documentation

## 2024-01-24 DIAGNOSIS — Z930 Tracheostomy status: Secondary | ICD-10-CM | POA: Diagnosis not present

## 2024-01-24 DIAGNOSIS — G1221 Amyotrophic lateral sclerosis: Secondary | ICD-10-CM | POA: Diagnosis not present

## 2024-01-24 DIAGNOSIS — Z789 Other specified health status: Secondary | ICD-10-CM

## 2024-01-24 DIAGNOSIS — E640 Sequelae of protein-calorie malnutrition: Secondary | ICD-10-CM | POA: Insufficient documentation

## 2024-01-24 DIAGNOSIS — G8250 Quadriplegia, unspecified: Secondary | ICD-10-CM

## 2024-01-24 DIAGNOSIS — Z931 Gastrostomy status: Secondary | ICD-10-CM

## 2024-01-24 DIAGNOSIS — R6889 Other general symptoms and signs: Secondary | ICD-10-CM

## 2024-01-24 LAB — LAB REPORT - SCANNED: EGFR: 157

## 2024-01-24 NOTE — Progress Notes (Addendum)
 Name: Jerry [name]   MRN: [id number]    DOB: [birth year]   Date:01/24/2024       Progress Note  Subjective  Chief Complaint  Chief Complaint  Patient presents with   Medical Management of Chronic Issues    I connected with  Jerry [name]  on 01/24/24 at 10:20 AM EDT by a video enabled telemedicine application and verified that I am speaking with the correct person using two identifiers.  I discussed the limitations of evaluation and management by telemedicine and the availability of in person appointments. The patient expressed understanding and agreed to proceed with the virtual visit  Staff also discussed with the patient that there may be a patient responsible charge related to this service. Patient Location: at home  Provider Location: [facility] Additional Individuals present: wife and caregiver - RN Makaylyn   Discussed the use of AI scribe software for clinical note transcription with the patient, who gave verbal consent to proceed.  History of Present Illness Jerry [name] is a 53 year old male with ALS who presents for a follow-up visit.  He has been diagnosed with ALS for five years and has a tracheostomy and PEG tube for nutrition, both placed on December 10, 2021. He has experienced issues with the trach and PEG tube in the past, leading to hospital visits, but he was not hospitalized in the last six months. The G tube is overdue for a change.  He receives 112 hours of nursing care per week, with coverage of eight hours Monday through Friday and twelve hours on weekends. This support has allowed his primary caregiver to return to work. He is now set up on the main level of the house, which has been equipped with a ventilator and oxygen concentrator, allowing him to interact with family members in the main area. He is paralyzed from the head down and has tetraplegia, but there has been no recent worsening of his condition. He is able to push saliva forward, and deep trach suctioning is  performed when necessary.  He is on full-time parenteral nutrition (TPN) and receives supplements and medications through his G tube. He is also on a scopolamine  patch and glycopyrrolate  for saliva management, and takes cetirizine  every night for allergy symptoms. He has experienced some gas pains and bloating, but not significantly. He is on Miralax  as needed for constipation, and he monitors his bowel movements closely. He sometimes experiences gas pains and has had episodes of vomiting, which are managed with suctioning. His liver enzymes have been elevated, which is thought to be related to the TPN. He has been in communication with the pharmacist, who monitors his liver enzymes and other levels weekly. His TPN run time was recently reduced from 20 hours to 18 hours, with supplemental tube feeds being introduced, although he has experienced gas from the [employer] supplement. A different supplement has been suggested by the nursing agency, but the name is not recalled. His glucose levels have been low, and his potassium levels are slightly low. He is slightly anemic with a hemoglobin of 11.7, and his platelets are slightly low at 137.  He is currently on a ventilator, receiving oxygen at a low rate of 1 liter. He has been able to manage secretions without significant issues, although deep trach suctioning is performed when he experiences reactions that cause him to throw up clear mucus.     Patient Active Problem List   Diagnosis Date Noted   Type 2 diabetes mellitus (HCC)  08/31/2023   On parenteral nutrition 08/31/2023   Failure to thrive in adult 07/27/2023   Intermittent constipation 07/27/2023   On total parenteral nutrition (TPN) 07/27/2023   Primary thrombocytopenia (HCC) 09/25/2022   Chronic respiratory failure with hypoxia (HCC) 06/22/2022   Ileus (HCC) 05/19/2022   Autonomic dysfunction 01/06/2022   Ventilator dependent (HCC) 01/06/2022   Tracheostomy dependent (HCC) 01/06/2022    Pressure injury of skin 12/27/2021   Anxiety 11/18/2021   Bed sore on buttock, right, unstageable (HCC) 11/18/2021   Severe protein-calorie malnutrition (HCC) 11/13/2021   Microscopic hematuria 10/29/2021   S/P percutaneous endoscopic gastrostomy (PEG) tube placement (HCC) 10/29/2021   ALS (amyotrophic lateral sclerosis) (HCC) 01/11/2019    Past Surgical History:  Procedure Laterality Date   IR REPLACE G-TUBE SIMPLE WO FLUORO  06/22/2022   MASS EXCISION Right 11/26/2014   Procedure: EXCISION MASS/GROIN EXCISION MASS;  Surgeon: Reyes LELON Cota, MD;  Location: [facility] OR[facility];  Service: General;  Laterality: Right;   MASS EXCISION Right 11/26/2014   Procedure: MINOR EXCISION OF MASS/POST THIGH MASS;  Surgeon: Reyes LELON Cota, MD;  Location: [facility];  Service: General;  Laterality: Right;   PEG PLACEMENT  10/27/2021   PORTACATH PLACEMENT Right 06/19/2019    Family History  Problem Relation Age of Onset   Diabetes Father    Diabetes Mother    Diabetes Sister    Hypertension Sister    Diabetes Brother    Diabetes Sister    Fibromyalgia Sister    Diabetes Sister     Social History   Socioeconomic History   Marital status: Married    Spouse name: Jerry [name]   Number of children: 3   Years of education: Not on file   Highest education level: High school graduate  Occupational History   Occupation: Biomedical scientist    Comment: self employed   Occupation: Consulting civil engineer    Comment: [employer]  Tobacco Use   Smoking status: Never   Smokeless tobacco: Never  Vaping Use   Vaping status: Never Used  Substance and Sexual Activity   Alcohol  use: No    Alcohol /week: 0.0 standard drinks of alcohol    Drug use: No   Sexual activity: Yes    Partners: Female  Other Topics Concern   Not on file  Social History Narrative   Lives with wife and 3 children   Approved for disability 02/2019 for ALS    Social Drivers of Health   Financial Resource Strain: Low Risk  (09/29/2022)   Overall  Financial Resource Strain (CARDIA)    Difficulty of Paying Living Expenses: Not very hard  Food Insecurity: No Food Insecurity (10/11/2023)   Hunger Vital Sign    Worried About Running Out of Food in the Last Year: Never true    Ran Out of Food in the Last Year: Never true  Transportation Needs: Unmet Transportation Needs (10/11/2023)   PRAPARE - Administrator, Civil Service (Medical): Yes    Lack of Transportation (Non-Medical): No  Physical Activity: Inactive (10/11/2023)   Exercise Vital Sign    Days of Exercise per Week: 0 days    Minutes of Exercise per Session: 0 min  Stress: Stress Concern Present (10/11/2023)   [employer] of Occupational Health - Occupational Stress Questionnaire    Feeling of Stress : To some extent  Social Connections: Unknown (10/11/2023)   Social Connection and Isolation Panel    Frequency of Communication with Friends and Family: More than three times a week  Frequency of Social Gatherings with Friends and Family: More than three times a week    Attends Religious Services: Not on file    Active Member of Clubs or Organizations: No    Attends Banker Meetings: Never    Marital Status: Married  Catering manager Violence: Not At Risk (10/11/2023)   Humiliation, Afraid, Rape, and Kick questionnaire    Fear of Current or Ex-Partner: No    Emotionally Abused: No    Physically Abused: No    Sexually Abused: No     Current Outpatient Medications:    cetirizine  HCl (ZYRTEC ) 5 MG/5ML SOLN, Take 10 mLs (10 mg total) by mouth at bedtime., Disp: 300 mL, Rfl: 3   glycopyrrolate  (ROBINUL ) 1 MG tablet, Place 1 tablet (1 mg total) into feeding tube 2 (two) times daily as needed., Disp: 180 tablet, Rfl: 3   ipratropium-albuterol  (DUONEB) 0.5-2.5 (3) MG/3ML SOLN, USE 3 ML VIA NEBULIZER EVERY 4 HOURS AS NEEDED, Disp: 360 mL, Rfl: 3   polyethylene glycol powder (GLYCOLAX /MIRALAX ) 17 GM/SCOOP powder, Place 17 g into feeding tube daily. Titrate as  needed for constipation, Disp: 255 g, Rfl: 3   Polyvinyl Alcohol -Povidone (CLEAR EYES NATURAL TEARS) 5-6 MG/ML SOLN, Apply 1 drop to eye 3 (three) times daily as needed., Disp: 30 mL, Rfl: 2   PRESCRIPTION MEDICATION, Home TPN, Disp: , Rfl:    scopolamine  (TRANSDERM-SCOP) 1 MG/3DAYS, PLACE 1 PATCH ONTO THE SKIN EVERY 3 DAYS, Disp: 10 patch, Rfl: 5   triamcinolone  cream (KENALOG ) 0.1 %, Apply 1 Application topically 2 (two) times daily., Disp: 453.6 g, Rfl: 0   Potassium Chloride  40 MEQ/15ML (20%) SOLN, TAKE 15 ML BY MOUTH TWICE DAILY (Patient not taking: Reported on 01/24/2024), Disp: 900 mL, Rfl: 0  Allergies  Allergen Reactions   Crab [Shellfish Allergy] Rash    I personally reviewed active problem list, medication list, allergies with the patient/caregiver today.   ROS  Ten systems reviewed and is negative except as mentioned in HPI    Objective  Virtual encounter, vitals not obtained.  There is no height or weight on file to calculate BMI.  Physical Exam  Awake , alert and oriented, laying on his hospital bed, nurse by bedside and also his wife. He seems content   Results for orders placed or performed in visit on 01/24/24 (from the past 72 hours)  Lab report - scanned     Status: None   Collection Time: 01/24/24  9:58 AM  Result Value Ref Range   EGFR 157.0     Comment: ABS BY HIM    PHQ2/9:    01/24/2024   11:26 AM 12/20/2023   10:09 AM 11/29/2023    9:50 AM 11/22/2023    9:45 AM 11/15/2023    9:57 AM  Depression screen PHQ 2/9  Decreased Interest 0 0 0 0 0  Down, Depressed, Hopeless 0 0 0 0 0  PHQ - 2 Score 0 0 0 0 0   PHQ-2/9 Result is negative.    Fall Risk:    01/24/2024   11:26 AM 11/08/2023    9:00 AM 10/17/2023   11:19 AM 10/11/2023    2:48 PM 09/09/2023    9:34 AM  Fall Risk   Falls in the past year? 0 Exclusion - non ambulatory Exclusion - non ambulatory Exclusion - non ambulatory Exclusion - non ambulatory  Number falls in past yr: 0      Injury with  Fall? 0  Risk for fall due to : No Fall Risks      Follow up Falls evaluation completed         Assessment & Plan Amyotrophic lateral sclerosis (ALS) with tetraplegia and ventilator dependence ALS diagnosed 5 years ago with progression to tetraplegia and ventilator dependence. Managed by palliative care team with home visits. - Continue palliative care management with home visits by Doctor Ascenso's team/[facility] . - Ensure communication with Doctor Ascenso for any changes in care needs. - he has urinary retention due to  ALS and needs incontinence supplies for the rest of his life. He needs urinary catheters to be used at least 5 times per day   Tracheostomy status with ongoing tracheostomy care Tracheostomy in place since December 10, 2021. No current issues with tracheostomy care. - Continue regular tracheostomy care and suctioning as needed.  Chronic respiratory failure with hypoxia Chronic respiratory failure managed with ventilator support. - Maintain current ventilator settings and oxygen supplementation.  PEG tube status with ongoing enteral nutrition and transition planning from total parenteral nutrition (TPN) PEG tube in place since December 10, 2021. Currently on full-time TPN with plans to transition to enteral nutrition. Liver enzymes elevated, likely due to TPN. - Contact Doctor Lamar Salen to discuss potential admission for G-tube replacement and transition to enteral nutrition. - Coordinate with a dietitian for nutritional assessment and transition planning. - Consider gradual transition from TPN to enteral feeds to reduce liver enzyme elevation and infection risk. - Monitor liver enzyme levels regularly. - Discuss with TPN pharmacist regarding potential adjustments to TPN regimen.  Constipation Intermittent constipation managed with Miralax  and prune juice as needed. - Administer Miralax  and prune juice as needed.  Mild anemia and thrombocytopenia, improving Mild  anemia with hemoglobin at 11.7 and thrombocytopenia with platelets at 137, both improving. - Continue regular monitoring of hemoglobin and platelet levels.  Hypoglycemia and hypokalemia, recurrent Recurrent hypoglycemia and hypokalemia managed with adjustments to TPN regimen by pharmacist. - Monitor blood glucose and potassium levels regularly. - Coordinate with TPN pharmacist for necessary adjustments.  Sialorrhea (excessive salivation) Managed with scopolamine  patch and glycopyrrolate . - Continue current management with scopolamine  patch and glycopyrrolate .  Allergic rhinitis, intermittent Intermittent allergic rhinitis managed with cetirizine . - Continue cetirizine  as needed for allergic symptoms.     I discussed the assessment and treatment plan with the patient. The patient was provided an opportunity to ask questions and all were answered. The patient agreed with the plan and demonstrated an understanding of the instructions.  The patient was advised to call back or seek an in-person evaluation if the symptoms worsen or if the condition fails to improve as anticipated.  I provided 25  minutes of non-face-to-face time during this encounter.

## 2024-01-26 ENCOUNTER — Encounter: Payer: Self-pay | Admitting: Family Medicine

## 2024-01-30 ENCOUNTER — Encounter: Payer: Self-pay | Admitting: Family Medicine

## 2024-01-30 LAB — LAB REPORT - SCANNED: EGFR: 153

## 2024-02-04 LAB — LAB REPORT - SCANNED: EGFR: 151

## 2024-02-07 ENCOUNTER — Encounter: Payer: Self-pay | Admitting: Family Medicine

## 2024-02-11 LAB — LAB REPORT - SCANNED: EGFR: 151

## 2024-02-19 LAB — LAB REPORT - SCANNED: EGFR: 146

## 2024-02-25 LAB — LAB REPORT - SCANNED: EGFR: 157

## 2024-02-26 ENCOUNTER — Emergency Department (HOSPITAL_COMMUNITY)

## 2024-02-26 ENCOUNTER — Other Ambulatory Visit: Payer: Self-pay

## 2024-02-26 ENCOUNTER — Encounter (HOSPITAL_COMMUNITY): Payer: Self-pay | Admitting: Emergency Medicine

## 2024-02-26 ENCOUNTER — Inpatient Hospital Stay (HOSPITAL_COMMUNITY)
Admission: EM | Admit: 2024-02-26 | Discharge: 2024-03-12 | DRG: 870 | Disposition: A | Discharge status: Home Health | Attending: Student | Admitting: Student

## 2024-02-26 DIAGNOSIS — Z8249 Family history of ischemic heart disease and other diseases of the circulatory system: Secondary | ICD-10-CM | POA: Diagnosis not present

## 2024-02-26 DIAGNOSIS — E871 Hypo-osmolality and hyponatremia: Secondary | ICD-10-CM | POA: Diagnosis present

## 2024-02-26 DIAGNOSIS — R1011 Right upper quadrant pain: Secondary | ICD-10-CM | POA: Diagnosis not present

## 2024-02-26 DIAGNOSIS — R1010 Upper abdominal pain, unspecified: Secondary | ICD-10-CM | POA: Diagnosis not present

## 2024-02-26 DIAGNOSIS — D638 Anemia in other chronic diseases classified elsewhere: Secondary | ICD-10-CM | POA: Diagnosis present

## 2024-02-26 DIAGNOSIS — E876 Hypokalemia: Secondary | ICD-10-CM | POA: Diagnosis not present

## 2024-02-26 DIAGNOSIS — L299 Pruritus, unspecified: Secondary | ICD-10-CM | POA: Diagnosis not present

## 2024-02-26 DIAGNOSIS — K819 Cholecystitis, unspecified: Secondary | ICD-10-CM

## 2024-02-26 DIAGNOSIS — J9601 Acute respiratory failure with hypoxia: Secondary | ICD-10-CM | POA: Diagnosis not present

## 2024-02-26 DIAGNOSIS — Z931 Gastrostomy status: Secondary | ICD-10-CM | POA: Diagnosis not present

## 2024-02-26 DIAGNOSIS — Z8616 Personal history of COVID-19: Secondary | ICD-10-CM | POA: Diagnosis not present

## 2024-02-26 DIAGNOSIS — E663 Overweight: Secondary | ICD-10-CM | POA: Diagnosis present

## 2024-02-26 DIAGNOSIS — Z91013 Allergy to seafood: Secondary | ICD-10-CM | POA: Diagnosis not present

## 2024-02-26 DIAGNOSIS — E872 Acidosis, unspecified: Secondary | ICD-10-CM | POA: Diagnosis not present

## 2024-02-26 DIAGNOSIS — J9610 Chronic respiratory failure, unspecified whether with hypoxia or hypercapnia: Secondary | ICD-10-CM | POA: Diagnosis not present

## 2024-02-26 DIAGNOSIS — Z1152 Encounter for screening for COVID-19: Secondary | ICD-10-CM | POA: Diagnosis not present

## 2024-02-26 DIAGNOSIS — Z789 Other specified health status: Secondary | ICD-10-CM | POA: Diagnosis not present

## 2024-02-26 DIAGNOSIS — R6520 Severe sepsis without septic shock: Secondary | ICD-10-CM | POA: Diagnosis not present

## 2024-02-26 DIAGNOSIS — J9611 Chronic respiratory failure with hypoxia: Secondary | ICD-10-CM | POA: Diagnosis present

## 2024-02-26 DIAGNOSIS — Z9911 Dependence on respirator [ventilator] status: Secondary | ICD-10-CM | POA: Diagnosis not present

## 2024-02-26 DIAGNOSIS — G1221 Amyotrophic lateral sclerosis: Secondary | ICD-10-CM | POA: Diagnosis present

## 2024-02-26 DIAGNOSIS — Z6827 Body mass index (BMI) 27.0-27.9, adult: Secondary | ICD-10-CM | POA: Diagnosis not present

## 2024-02-26 DIAGNOSIS — Z833 Family history of diabetes mellitus: Secondary | ICD-10-CM | POA: Diagnosis not present

## 2024-02-26 DIAGNOSIS — E43 Unspecified severe protein-calorie malnutrition: Secondary | ICD-10-CM | POA: Diagnosis present

## 2024-02-26 DIAGNOSIS — Z930 Tracheostomy status: Secondary | ICD-10-CM | POA: Diagnosis not present

## 2024-02-26 DIAGNOSIS — K810 Acute cholecystitis: Secondary | ICD-10-CM | POA: Diagnosis present

## 2024-02-26 DIAGNOSIS — R1310 Dysphagia, unspecified: Secondary | ICD-10-CM | POA: Diagnosis present

## 2024-02-26 DIAGNOSIS — Z5982 Transportation insecurity: Secondary | ICD-10-CM

## 2024-02-26 DIAGNOSIS — Z8661 Personal history of infections of the central nervous system: Secondary | ICD-10-CM

## 2024-02-26 DIAGNOSIS — G8250 Quadriplegia, unspecified: Secondary | ICD-10-CM | POA: Diagnosis present

## 2024-02-26 DIAGNOSIS — A419 Sepsis, unspecified organism: Principal | ICD-10-CM | POA: Diagnosis present

## 2024-02-26 DIAGNOSIS — E1165 Type 2 diabetes mellitus with hyperglycemia: Secondary | ICD-10-CM | POA: Diagnosis present

## 2024-02-26 DIAGNOSIS — L8990 Pressure ulcer of unspecified site, unspecified stage: Secondary | ICD-10-CM | POA: Diagnosis present

## 2024-02-26 DIAGNOSIS — R109 Unspecified abdominal pain: Secondary | ICD-10-CM | POA: Diagnosis present

## 2024-02-26 DIAGNOSIS — Z79899 Other long term (current) drug therapy: Secondary | ICD-10-CM

## 2024-02-26 HISTORY — PX: IR PERC CHOLECYSTOSTOMY: IMG2326

## 2024-02-26 LAB — CBC WITH DIFFERENTIAL/PLATELET
Abs Immature Granulocytes: 0.02 K/uL (ref 0.00–0.07)
Abs Immature Granulocytes: 0.05 K/uL (ref 0.00–0.07)
Basophils Absolute: 0 K/uL (ref 0.0–0.1)
Basophils Absolute: 0 K/uL (ref 0.0–0.1)
Basophils Relative: 0 %
Basophils Relative: 0 %
Eosinophils Absolute: 0 K/uL (ref 0.0–0.5)
Eosinophils Absolute: 0.3 K/uL (ref 0.0–0.5)
Eosinophils Relative: 0 %
Eosinophils Relative: 3 %
HCT: 31 % — ABNORMAL LOW (ref 39.0–52.0)
HCT: 37 % — ABNORMAL LOW (ref 39.0–52.0)
Hemoglobin: 8.6 g/dL — ABNORMAL LOW (ref 13.0–17.0)
Hemoglobin: 11.9 g/dL — ABNORMAL LOW (ref 13.0–17.0)
Immature Granulocytes: 0 %
Immature Granulocytes: 1 %
Lymphocytes Relative: 12 %
Lymphocytes Relative: 11 %
Lymphs Abs: 1 K/uL (ref 0.7–4.0)
Lymphs Abs: 1.1 K/uL (ref 0.7–4.0)
MCH: 27.5 pg (ref 26.0–34.0)
MCH: 27.1 pg (ref 26.0–34.0)
MCHC: 27.7 g/dL — ABNORMAL LOW (ref 30.0–36.0)
MCHC: 32.2 g/dL (ref 30.0–36.0)
MCV: 99 fL (ref 80.0–100.0)
MCV: 84.3 fL (ref 80.0–100.0)
Monocytes Absolute: 0.4 K/uL (ref 0.1–1.0)
Monocytes Absolute: 0.7 K/uL (ref 0.1–1.0)
Monocytes Relative: 5 %
Monocytes Relative: 6 %
Neutro Abs: 6.6 K/uL (ref 1.7–7.7)
Neutro Abs: 8.2 K/uL — ABNORMAL HIGH (ref 1.7–7.7)
Neutrophils Relative %: 83 %
Neutrophils Relative %: 79 %
Platelets: 165 K/uL (ref 150–400)
Platelets: 151 K/uL (ref 150–400)
RBC: 3.13 MIL/uL — ABNORMAL LOW (ref 4.22–5.81)
RBC: 4.39 MIL/uL (ref 4.22–5.81)
RDW: 18 % — ABNORMAL HIGH (ref 11.5–15.5)
RDW: 17 % — ABNORMAL HIGH (ref 11.5–15.5)
WBC: 8 K/uL (ref 4.0–10.5)
WBC: 10.4 K/uL (ref 4.0–10.5)
nRBC: 0 % (ref 0.0–0.2)
nRBC: 0 % (ref 0.0–0.2)

## 2024-02-26 LAB — COMPREHENSIVE METABOLIC PANEL WITH GFR
ALT: 117 U/L — ABNORMAL HIGH (ref 0–44)
ALT: 163 U/L — ABNORMAL HIGH (ref 0–44)
ALT: 124 U/L — ABNORMAL HIGH (ref 0–44)
AST: 120 U/L — ABNORMAL HIGH (ref 15–41)
AST: 162 U/L — ABNORMAL HIGH (ref 15–41)
AST: 106 U/L — ABNORMAL HIGH (ref 15–41)
Albumin: 2.3 g/dL — ABNORMAL LOW (ref 3.5–5.0)
Albumin: 3.3 g/dL — ABNORMAL LOW (ref 3.5–5.0)
Albumin: 2.8 g/dL — ABNORMAL LOW (ref 3.5–5.0)
Alkaline Phosphatase: 313 U/L — ABNORMAL HIGH (ref 38–126)
Alkaline Phosphatase: 472 U/L — ABNORMAL HIGH (ref 38–126)
Alkaline Phosphatase: 373 U/L — ABNORMAL HIGH (ref 38–126)
Anion gap: 23 — ABNORMAL HIGH (ref 5–15)
Anion gap: 12 (ref 5–15)
Anion gap: 13 (ref 5–15)
BUN: 5 mg/dL — ABNORMAL LOW (ref 6–20)
BUN: 7 mg/dL (ref 6–20)
BUN: 5 mg/dL — ABNORMAL LOW (ref 6–20)
CO2: 13 mmol/L — ABNORMAL LOW (ref 22–32)
CO2: 17 mmol/L — ABNORMAL LOW (ref 22–32)
CO2: 17 mmol/L — ABNORMAL LOW (ref 22–32)
Calcium: 8.8 mg/dL — ABNORMAL LOW (ref 8.9–10.3)
Calcium: 9.5 mg/dL (ref 8.9–10.3)
Calcium: 8.9 mg/dL (ref 8.9–10.3)
Chloride: 78 mmol/L — ABNORMAL LOW (ref 98–111)
Chloride: 105 mmol/L (ref 98–111)
Chloride: 103 mmol/L (ref 98–111)
Creatinine, Ser: 0.44 mg/dL — ABNORMAL LOW (ref 0.61–1.24)
Creatinine, Ser: <0.3 mg/dL — ABNORMAL LOW (ref 0.61–1.24)
Creatinine, Ser: <0.3 mg/dL — ABNORMAL LOW (ref 0.61–1.24)
GFR, Estimated: >60 mL/min (ref >60)
Glucose, Bld: >1200 mg/dL (ref 70–99)
Glucose, Bld: 170 mg/dL — ABNORMAL HIGH (ref 70–99)
Glucose, Bld: 117 mg/dL — ABNORMAL HIGH (ref 70–99)
Potassium: >7.5 mmol/L (ref 3.5–5.1)
Potassium: 3.5 mmol/L (ref 3.5–5.1)
Potassium: 2.2 mmol/L — CL (ref 3.5–5.1)
Sodium: 114 mmol/L — CL (ref 135–145)
Sodium: 134 mmol/L — ABNORMAL LOW (ref 135–145)
Sodium: 133 mmol/L — ABNORMAL LOW (ref 135–145)
Total Bilirubin: 2.6 mg/dL — ABNORMAL HIGH (ref 0.0–1.2)
Total Bilirubin: 3.8 mg/dL — ABNORMAL HIGH (ref 0.0–1.2)
Total Bilirubin: 5 mg/dL — ABNORMAL HIGH (ref 0.0–1.2)
Total Protein: 6 g/dL — ABNORMAL LOW (ref 6.5–8.1)
Total Protein: 8.4 g/dL — ABNORMAL HIGH (ref 6.5–8.1)
Total Protein: 7.5 g/dL (ref 6.5–8.1)

## 2024-02-26 LAB — EXPECTORATED SPUTUM ASSESSMENT W GRAM STAIN, RFLX TO RESP C

## 2024-02-26 LAB — URINALYSIS, ROUTINE W REFLEX MICROSCOPIC
Bacteria, UA: NONE SEEN
Bilirubin Urine: NEGATIVE
Glucose, UA: 50 mg/dL — AB
Hgb urine dipstick: NEGATIVE
Ketones, ur: 5 mg/dL — AB
Nitrite: NEGATIVE
Protein, ur: 30 mg/dL — AB
Specific Gravity, Urine: >1.046 — ABNORMAL HIGH (ref 1.005–1.030)
pH: 7 (ref 5.0–8.0)

## 2024-02-26 LAB — I-STAT VENOUS BLOOD GAS, ED
Acid-base deficit: 3 mmol/L — ABNORMAL HIGH (ref 0.0–2.0)
Bicarbonate: 19.2 mmol/L — ABNORMAL LOW (ref 20.0–28.0)
Calcium, Ion: 1.15 mmol/L (ref 1.15–1.40)
HCT: 35 % — ABNORMAL LOW (ref 39.0–52.0)
Hemoglobin: 11.9 g/dL — ABNORMAL LOW (ref 13.0–17.0)
O2 Saturation: 94 %
Potassium: 3.5 mmol/L (ref 3.5–5.1)
Sodium: 137 mmol/L (ref 135–145)
TCO2: 20 mmol/L — ABNORMAL LOW (ref 22–32)
pCO2, Ven: 26.7 mmHg — ABNORMAL LOW (ref 44–60)
pH, Ven: 7.465 — ABNORMAL HIGH (ref 7.25–7.43)
pO2, Ven: 65 mmHg — ABNORMAL HIGH (ref 32–45)

## 2024-02-26 LAB — RESP PANEL BY RT-PCR (RSV, FLU A&B, COVID)  RVPGX2
Influenza A by PCR: NEGATIVE
Influenza B by PCR: NEGATIVE
Resp Syncytial Virus by PCR: NEGATIVE
SARS Coronavirus 2 by RT PCR: NEGATIVE

## 2024-02-26 LAB — GLUCOSE, CAPILLARY
Glucose-Capillary: 115 mg/dL — ABNORMAL HIGH (ref 70–99)
Glucose-Capillary: 101 mg/dL — ABNORMAL HIGH (ref 70–99)
Glucose-Capillary: 92 mg/dL (ref 70–99)

## 2024-02-26 LAB — HEMOGLOBIN A1C
Hgb A1c MFr Bld: 5.2 % (ref 4.8–5.6)
Mean Plasma Glucose: 102.54 mg/dL

## 2024-02-26 LAB — TYPE AND SCREEN
ABO/RH(D): O POS
Antibody Screen: NEGATIVE

## 2024-02-26 LAB — BETA-HYDROXYBUTYRIC ACID: Beta-Hydroxybutyric Acid: 0.1 mmol/L (ref 0.05–0.27)

## 2024-02-26 LAB — I-STAT CG4 LACTIC ACID, ED
Lactic Acid, Venous: 0.6 mmol/L (ref 0.5–1.9)
Lactic Acid, Venous: 2.3 mmol/L (ref 0.5–1.9)

## 2024-02-26 LAB — LACTIC ACID, PLASMA: Lactic Acid, Venous: 1 mmol/L (ref 0.5–1.9)

## 2024-02-26 LAB — HIV ANTIBODY (ROUTINE TESTING W REFLEX): HIV Screen 4th Generation wRfx: NONREACTIVE

## 2024-02-26 LAB — LIPASE, BLOOD: Lipase: 20 U/L (ref 11–51)

## 2024-02-26 LAB — CBG MONITORING, ED: Glucose-Capillary: 176 mg/dL — ABNORMAL HIGH (ref 70–99)

## 2024-02-26 LAB — MRSA NEXT GEN BY PCR, NASAL: MRSA by PCR Next Gen: NOT DETECTED

## 2024-02-26 MED ORDER — SODIUM ZIRCONIUM CYCLOSILICATE 10 G PO PACK
10.0000 g | PACK | Freq: Once | ORAL | Status: DC
  Filled 2024-02-26: qty 1

## 2024-02-26 MED ORDER — VANCOMYCIN HCL 1250 MG/250ML IV SOLN
1250.0000 mg | Freq: Once | INTRAVENOUS | Status: AC
  Administered 2024-02-26: 1250 mg via INTRAVENOUS
  Filled 2024-02-26: qty 250

## 2024-02-26 MED ORDER — METRONIDAZOLE 500 MG/100ML IV SOLN: 500.0000 mg | Freq: Two times a day (BID) | INTRAVENOUS | Status: DC

## 2024-02-26 MED ORDER — DEXTROSE IN LACTATED RINGERS 5 % IV SOLN: INTRAVENOUS | Status: DC

## 2024-02-26 MED ORDER — CHLORHEXIDINE GLUCONATE CLOTH 2 % EX PADS
6.0000 | MEDICATED_PAD | Freq: Every day | CUTANEOUS | Status: DC
  Administered 2024-02-26 – 2024-02-27 (×2): 6 via TOPICAL

## 2024-02-26 MED ORDER — ACETAMINOPHEN 325 MG PO TABS: 650.0000 mg | ORAL_TABLET | Freq: Four times a day (QID) | ORAL | Status: DC | PRN

## 2024-02-26 MED ORDER — SODIUM BICARBONATE 8.4 % IV SOLN
50.0000 meq | Freq: Once | INTRAVENOUS | Status: DC
  Filled 2024-02-26: qty 50

## 2024-02-26 MED ORDER — LIDOCAINE HCL 1 % IJ SOLN
INTRAMUSCULAR | Status: AC
  Filled 2024-02-26: qty 20

## 2024-02-26 MED ORDER — LACTATED RINGERS IV BOLUS
1000.0000 mL | Freq: Once | INTRAVENOUS | Status: AC
  Administered 2024-02-26: 1000 mL via INTRAVENOUS

## 2024-02-26 MED ORDER — SODIUM CHLORIDE 0.9% FLUSH
10.0000 mL | INTRAVENOUS | Status: DC | PRN
  Administered 2024-03-12: 10 mL

## 2024-02-26 MED ORDER — HYDROMORPHONE HCL 1 MG/ML IJ SOLN
0.5000 mg | Freq: Once | INTRAMUSCULAR | Status: AC
  Administered 2024-02-26: 0.5 mg via INTRAVENOUS
  Filled 2024-02-26: qty 1

## 2024-02-26 MED ORDER — METRONIDAZOLE 500 MG/100ML IV SOLN
500.0000 mg | Freq: Once | INTRAVENOUS | Status: AC
  Administered 2024-02-26: 500 mg via INTRAVENOUS
  Filled 2024-02-26: qty 100

## 2024-02-26 MED ORDER — LACTATED RINGERS IV SOLN: INTRAVENOUS | Status: AC

## 2024-02-26 MED ORDER — LIDOCAINE HCL 1 % IJ SOLN
20.0000 mL | Freq: Once | INTRAMUSCULAR | Status: AC
  Administered 2024-02-26: 10 mL
  Filled 2024-02-26: qty 20

## 2024-02-26 MED ORDER — IOHEXOL 300 MG/ML  SOLN
50.0000 mL | Freq: Once | INTRAMUSCULAR | Status: AC | PRN
  Administered 2024-02-26: 10 mL

## 2024-02-26 MED ORDER — DOCUSATE SODIUM 50 MG/5ML PO LIQD: 100.0000 mg | Freq: Two times a day (BID) | ORAL | Status: DC | PRN

## 2024-02-26 MED ORDER — ACETAMINOPHEN 650 MG RE SUPP
650.0000 mg | Freq: Once | RECTAL | Status: AC
  Administered 2024-02-26: 650 mg via RECTAL
  Filled 2024-02-26: qty 1

## 2024-02-26 MED ORDER — INSULIN ASPART 100 UNIT/ML IJ SOLN: 0.0000 [IU] | INTRAMUSCULAR | Status: DC

## 2024-02-26 MED ORDER — CALCIUM GLUCONATE 10 % IV SOLN
1.0000 g | Freq: Once | INTRAVENOUS | Status: DC
  Filled 2024-02-26: qty 10

## 2024-02-26 MED ORDER — SODIUM CHLORIDE 0.9 % IV SOLN
2.0000 g | Freq: Once | INTRAVENOUS | Status: AC
  Administered 2024-02-26: 2 g via INTRAVENOUS
  Filled 2024-02-26: qty 12.5

## 2024-02-26 MED ORDER — ONDANSETRON HCL 4 MG/2ML IJ SOLN
4.0000 mg | Freq: Four times a day (QID) | INTRAMUSCULAR | Status: DC | PRN
  Administered 2024-02-26 – 2024-02-28 (×2): 4 mg via INTRAVENOUS
  Filled 2024-02-26 (×2): qty 2

## 2024-02-26 MED ORDER — SODIUM CHLORIDE 0.9 % IV SOLN: 2.0000 g | Freq: Two times a day (BID) | INTRAVENOUS | Status: DC

## 2024-02-26 MED ORDER — POLYETHYLENE GLYCOL 3350 17 G PO PACK: 17.0000 g | PACK | Freq: Every day | ORAL | Status: DC | PRN

## 2024-02-26 MED ORDER — FENTANYL CITRATE (PF) 100 MCG/2ML IJ SOLN
INTRAMUSCULAR | Status: AC | PRN
  Administered 2024-02-26 (×2): 50 ug via INTRAVENOUS

## 2024-02-26 MED ORDER — MIDAZOLAM HCL 2 MG/2ML IJ SOLN
INTRAMUSCULAR | Status: AC
Start: 2024-02-26 — End: 2024-02-26
  Filled 2024-02-26: qty 2

## 2024-02-26 MED ORDER — ONDANSETRON HCL 4 MG/2ML IJ SOLN
4.0000 mg | Freq: Once | INTRAMUSCULAR | Status: AC
  Administered 2024-02-26: 4 mg via INTRAVENOUS
  Filled 2024-02-26: qty 2

## 2024-02-26 MED ORDER — LACTATED RINGERS IV SOLN: INTRAVENOUS | Status: DC

## 2024-02-26 MED ORDER — ACETAMINOPHEN 650 MG RE SUPP: 650.0000 mg | Freq: Four times a day (QID) | RECTAL | Status: DC | PRN

## 2024-02-26 MED ORDER — FENTANYL CITRATE (PF) 100 MCG/2ML IJ SOLN
INTRAMUSCULAR | Status: AC
  Filled 2024-02-26: qty 2

## 2024-02-26 MED ORDER — PIPERACILLIN-TAZOBACTAM 3.375 G IVPB
3.3750 g | Freq: Three times a day (TID) | INTRAVENOUS | Status: DC
  Administered 2024-02-27 – 2024-03-07 (×28): 3.375 g via INTRAVENOUS
  Filled 2024-02-26 (×27): qty 50

## 2024-02-26 MED ORDER — DEXTROSE 50 % IV SOLN: 0.0000 mL | INTRAVENOUS | Status: DC | PRN

## 2024-02-26 MED ORDER — FENTANYL CITRATE PF 50 MCG/ML IJ SOSY
50.0000 ug | PREFILLED_SYRINGE | Freq: Once | INTRAMUSCULAR | Status: AC
  Administered 2024-02-26: 50 ug via INTRAVENOUS
  Filled 2024-02-26: qty 1

## 2024-02-26 MED ORDER — LACTATED RINGERS IV BOLUS: 20.0000 mL/kg | Freq: Once | INTRAVENOUS | Status: DC

## 2024-02-26 MED ORDER — MIDAZOLAM HCL 2 MG/2ML IJ SOLN
INTRAMUSCULAR | Status: AC | PRN
  Administered 2024-02-26 (×2): 1 mg via INTRAVENOUS

## 2024-02-26 MED ORDER — PANTOPRAZOLE SODIUM 40 MG IV SOLR
40.0000 mg | Freq: Two times a day (BID) | INTRAVENOUS | Status: DC
  Administered 2024-02-26 (×2): 40 mg via INTRAVENOUS
  Filled 2024-02-26 (×3): qty 10

## 2024-02-26 MED ORDER — FAMOTIDINE 20 MG PO TABS
20.0000 mg | ORAL_TABLET | Freq: Two times a day (BID) | ORAL | Status: DC
  Administered 2024-02-26: 20 mg
  Filled 2024-02-26 (×2): qty 1

## 2024-02-26 MED ORDER — INSULIN REGULAR(HUMAN) IN NACL 100-0.9 UT/100ML-% IV SOLN: INTRAVENOUS | Status: DC

## 2024-02-26 MED ORDER — FENTANYL CITRATE PF 50 MCG/ML IJ SOSY
25.0000 ug | PREFILLED_SYRINGE | INTRAMUSCULAR | Status: DC | PRN
  Administered 2024-02-26 – 2024-03-04 (×11): 25 ug via INTRAVENOUS
  Filled 2024-02-26 (×11): qty 1

## 2024-02-26 MED ORDER — POTASSIUM CHLORIDE 10 MEQ/50ML IV SOLN
10.0000 meq | INTRAVENOUS | Status: AC
  Administered 2024-02-26 – 2024-02-27 (×6): 10 meq via INTRAVENOUS
  Filled 2024-02-26 (×6): qty 50

## 2024-02-26 MED ORDER — SODIUM CHLORIDE 0.9% FLUSH
10.0000 mL | Freq: Two times a day (BID) | INTRAVENOUS | Status: DC
  Administered 2024-02-26 – 2024-02-28 (×6): 10 mL
  Administered 2024-02-29: 30 mL
  Administered 2024-02-29 – 2024-03-09 (×16): 10 mL
  Administered 2024-03-10: 20 mL
  Administered 2024-03-10 – 2024-03-12 (×4): 10 mL

## 2024-02-26 MED ORDER — SODIUM CHLORIDE 0.9% FLUSH: 5.0000 mL | Freq: Three times a day (TID) | INTRAVENOUS | Status: DC

## 2024-02-26 MED ORDER — ALBUTEROL SULFATE (2.5 MG/3ML) 0.083% IN NEBU
10.0000 mg | INHALATION_SOLUTION | Freq: Once | RESPIRATORY_TRACT | Status: DC
  Filled 2024-02-26: qty 12

## 2024-02-26 MED ORDER — DOCUSATE SODIUM 100 MG PO CAPS: 100.0000 mg | ORAL_CAPSULE | Freq: Two times a day (BID) | ORAL | Status: DC | PRN

## 2024-02-26 MED ORDER — HEPARIN SODIUM (PORCINE) 5000 UNIT/ML IJ SOLN
5000.0000 [IU] | Freq: Three times a day (TID) | INTRAMUSCULAR | Status: DC
  Administered 2024-02-26 – 2024-03-12 (×46): 5000 [IU] via SUBCUTANEOUS
  Filled 2024-02-26 (×44): qty 1

## 2024-02-26 MED ORDER — VANCOMYCIN HCL 750 MG/150ML IV SOLN
750.0000 mg | Freq: Two times a day (BID) | INTRAVENOUS | Status: DC
  Administered 2024-02-27 – 2024-02-29 (×5): 750 mg via INTRAVENOUS
  Filled 2024-02-26 (×6): qty 150

## 2024-02-26 MED ORDER — CARMEX CLASSIC LIP BALM EX OINT
TOPICAL_OINTMENT | CUTANEOUS | Status: DC | PRN
  Filled 2024-02-26 (×2): qty 10

## 2024-02-26 MED ORDER — IOHEXOL 350 MG/ML SOLN
75.0000 mL | Freq: Once | INTRAVENOUS | Status: AC | PRN
  Administered 2024-02-26: 75 mL via INTRAVENOUS

## 2024-02-26 NOTE — Consult Note (Signed)
 Chief Complaint: Acute cholecystitis  Referring Provider(s): Dr. Stevie  Supervising Physician: Johann Sieving  Patient Status: [facility] - In-pt  History of Present Illness: Jerry [name] is a 53 y.o. male with h/o of ALS, DM, trach dependency, long term TPN use (not tolerating TF, L arm PICC) who presented to the ER for abd pain and N/V today. CT concerning for acute cholecystitis.  He is febrile and tachycardic. Elevated LFTs are chronic, but bili has gone from 2.6 to 3.8 today alone.   Patient seen by GI (Dr. Stevie) who recommends IR consult for perc chole.  Allergies Reviewed:  Georgianne cerise allergy]   Patient is Full Code  Past Medical History:  Diagnosis Date   ALS (amyotrophic lateral sclerosis) (HCC)    COVID-19 virus infection 06/2020   Meningitis 2003   spinal   Morbid obesity (HCC)    Neuromuscular disorder (HCC)    Prediabetes 11/05/2016   A1C 5.7 on 11/05/16   Type 2 diabetes mellitus (HCC) 08/31/2023    Past Surgical History:  Procedure Laterality Date   IR REPLACE G-TUBE SIMPLE WO FLUORO  06/22/2022   MASS EXCISION Right 11/26/2014   Procedure: EXCISION MASS/GROIN EXCISION MASS;  Surgeon: Reyes LELON Cota, MD;  Location: [facility] ORS;  Service: General;  Laterality: Right;   MASS EXCISION Right 11/26/2014   Procedure: MINOR EXCISION OF MASS/POST THIGH MASS;  Surgeon: Reyes LELON Cota, MD;  Location: [facility] ORS;  Service: General;  Laterality: Right;   PEG PLACEMENT  10/27/2021   PORTACATH PLACEMENT Right 06/19/2019      Medications: Prior to Admission medications   Medication Sig Start Date End Date Taking? Authorizing Provider  cetirizine  HCl (ZYRTEC ) 5 MG/5ML SOLN Take 10 mLs (10 mg total) by mouth at bedtime. 07/27/23   Sowles, Krichna, MD  glycopyrrolate  (ROBINUL ) 1 MG tablet Place 1 tablet (1 mg total) into feeding tube 2 (two) times daily as needed. 07/27/23   Sowles, Krichna, MD  ipratropium-albuterol  (DUONEB) 0.5-2.5 (3) MG/3ML SOLN USE 3  ML VIA NEBULIZER EVERY 4 HOURS AS NEEDED 11/04/23   Glenard, Krichna, MD  polyethylene glycol powder (GLYCOLAX /MIRALAX ) 17 GM/SCOOP powder Place 17 g into feeding tube daily. Titrate as needed for constipation 07/27/23   Sowles, Krichna, MD  Polyvinyl Alcohol -Povidone (CLEAR EYES NATURAL TEARS) 5-6 MG/ML SOLN Apply 1 drop to eye 3 (three) times daily as needed. 07/27/23   Sowles, Krichna, MD  Potassium Chloride  40 MEQ/15ML (20%) SOLN TAKE 15 ML BY MOUTH TWICE DAILY Patient not taking: Reported on 01/24/2024 08/26/23   Sowles, Krichna, MD  PRESCRIPTION MEDICATION Home TPN    [provider]  scopolamine  (TRANSDERM-SCOP) 1 MG/3DAYS PLACE 1 PATCH ONTO THE SKIN EVERY 3 DAYS 08/18/23   Sowles, Krichna, MD  triamcinolone  cream (KENALOG ) 0.1 % Apply 1 Application topically 2 (two) times daily. 07/27/23   Sowles, Krichna, MD     Family History  Problem Relation Age of Onset   Diabetes Father    Diabetes Mother    Diabetes Sister    Hypertension Sister    Diabetes Brother    Diabetes Sister    Fibromyalgia Sister    Diabetes Sister     Social History   Socioeconomic History   Marital status: Married    Spouse name: [name]   Number of children: 3   Years of education: Not on file   Highest education level: High school graduate  Occupational History   Occupation: Biomedical scientist    Comment: self employed  Occupation: Consulting civil engineer    Comment: [employer]  Tobacco Use   Smoking status: Never   Smokeless tobacco: Never  Vaping Use   Vaping status: Never Used  Substance and Sexual Activity   Alcohol  use: No    Alcohol /week: 0.0 standard drinks of alcohol    Drug use: No   Sexual activity: Yes    Partners: Female  Other Topics Concern   Not on file  Social History Narrative   Lives with wife and 3 children   Approved for disability 02/2019 for ALS    Social Drivers of Health   Financial Resource Strain: Low Risk  (09/29/2022)   Overall Financial Resource Strain (CARDIA)     Difficulty of Paying Living Expenses: Not very hard  Food Insecurity: No Food Insecurity (10/11/2023)   Hunger Vital Sign    Worried About Running Out of Food in the Last Year: Never true    Ran Out of Food in the Last Year: Never true  Transportation Needs: Unmet Transportation Needs (10/11/2023)   PRAPARE - Administrator, Civil Service (Medical): Yes    Lack of Transportation (Non-Medical): No  Physical Activity: Inactive (10/11/2023)   Exercise Vital Sign    Days of Exercise per Week: 0 days    Minutes of Exercise per Session: 0 min  Stress: Stress Concern Present (10/11/2023)   [employer] of Occupational Health - Occupational Stress Questionnaire    Feeling of Stress : To some extent  Social Connections: Unknown (10/11/2023)   Social Connection and Isolation Panel    Frequency of Communication with Friends and Family: More than three times a week    Frequency of Social Gatherings with Friends and Family: More than three times a week    Attends Religious Services: Not on Marketing executive or Organizations: No    Attends Banker Meetings: Never    Marital Status: Married    Patient responds to painful stimuli. ROS limited by this.   Vital Signs: BP (!) 143/88   Pulse (!) 104   Temp (!) 101.3 F (38.5 C) (Oral)   Resp 16   SpO2 100%     Physical Exam Constitutional:      Appearance: He is diaphoretic.     Comments: Responds to painful stimuli  Cardiovascular:     Rate and Rhythm: Tachycardia present.     Pulses: Normal pulses.     Heart sounds: Normal heart sounds.  Pulmonary:     Breath sounds: Normal breath sounds.     Comments: Trach to vent Abdominal:     General: There is distension.     Palpations: Abdomen is soft.     Tenderness: There is abdominal tenderness in the right upper quadrant and right lower quadrant.     Comments: G tube present and intact  Musculoskeletal:     Right lower leg: Edema present.     Left  lower leg: Edema present.  Skin:    General: Skin is warm.     Imaging: CT CHEST ABDOMEN PELVIS W CONTRAST Result Date: 02/26/2024 CLINICAL DATA:  53 year old male with sepsis. Abdominal distension. Hypertension. EXAM: CT CHEST, ABDOMEN, AND PELVIS WITH CONTRAST TECHNIQUE: Multidetector CT imaging of the chest, abdomen and pelvis was performed following the standard protocol during bolus administration of intravenous contrast. RADIATION DOSE REDUCTION: This exam was performed according to the departmental dose-optimization program which includes automated exposure control, adjustment of the mA and/or kV according to patient  size and/or use of iterative reconstruction technique. CONTRAST:  75mL OMNIPAQUE  IOHEXOL  350 MG/ML SOLN COMPARISON:  Portable chest 0419 hours today. CTA chest 06/05/2023 and CT Abdomen and Pelvis 09/16/2023. FINDINGS: CT CHEST FINDINGS Cardiovascular: Chronic right chest Port-A-Cath. Normal heart size. No pericardial effusion. Aberrant origin right subclavian artery, normal variant. Little to no thoracic atherosclerosis identified. Mediastinum/Nodes: Negative for mediastinal mass, lymphadenopathy. Lungs/Pleura: Chronic tracheostomy is stable from last year. Small volume layering secretions in the trachea near the tube. Otherwise major airways are patent. Lung volumes are stable, chronic elevation of the left hemidiaphragm. Chronic left lung base atelectasis and small volume pleural effusion or pleural thickening, unchanged from last year. Mild respiratory motion. Mild contralateral dependent right lung atelectasis. No acute lung finding. Musculoskeletal: Stable.  No acute or suspicious osseous lesion. CT ABDOMEN PELVIS FINDINGS Hepatobiliary: Chronic gallbladder hydrops, but new perihepatic, pericholecystic free fluid (series 6, image 53). Layering sludge or stones series 3, image 83 is newly visible, and elsewhere the gallbladder wall appears indistinct (series 3, image 64). Liver  enhancement remains normal. No bile duct dilatation. Pancreas: Stable atrophy. Spleen: Trace perisplenic free fluid is new. Otherwise stable and negative spleen. Adrenals/Urinary Tract: Normal adrenal glands. Bilateral nephrolithiasis. Symmetric renal enhancement and contrast excretion with no hydronephrosis or hydroureter. Distended urinary bladder. And there is a 5 mm calculus within the bladder now, to the left of midline dependently medial to the left UVJ. See series 3, image 104. The distal ureters and ureterovesical junctions are decompressed and negative. Stomach/Bowel: Featureless appearance of the rectosigmoid colon similar to March. Redundant large bowel. Upstream proximal sigmoid, descending colon are decompressed. Gas distended non dependent transverse colon also containing some fluid. Upstream right colon is decompressed. Mildly gas and stool distended cecum. Cecum is on a lax mesentery. Terminal ileum is decompressed. Gas containing appendix remains normal near the midline on series 3, image 86. Nondilated small bowel. Percutaneous gastrostomy tube is new. Air and fluid within the stomach. Decompressed duodenum. No pneumoperitoneum identified. Free fluid with simple fluid density including tracking into the bilateral lower quadrants (coronal image 52). Vascular/Lymphatic: Normal caliber abdominal aorta. Major arterial structures, portal venous system are patent. No significant atherosclerosis. No lymphadenopathy. Reproductive: Negative. Other: No pelvis free fluid. Musculoskeletal: Stable. Chronic lower abdominal, pelvic and visible proximal lower extremity muscle atrophy which is diffuse, symmetric. No acute or suspicious osseous lesion. IMPRESSION: 1. Chronic Gallbladder Hydrops, but new simple density free fluid in the abdomen, indistinct appearance of the gallbladder wall, with layering sludge or stones. Constellation highly suspicious for Acute Cholecystitis. 2. No other definite acute or  inflammatory process in the abdomen or pelvis; Redundant large bowel with featureless appearance of the rectosigmoid colon similar to March. No evidence of bowel obstruction.  New PEG tube.  Normal appendix. Bilateral nephrolithiasis, and new 5 mm urinary calculus within the bladder, but no acute obstructive uropathy. Chronic left lung base fibrothorax versus pleural effusion and atelectasis. Chronic tracheostomy. Generalized lower abdominal, pelvic, and proximal lower extremity muscle atrophy. Electronically Signed   By: VEAR Hurst M.D.   On: 02/26/2024 12:41   DG Chest Portable 1 View Result Date: 02/26/2024 CLINICAL DATA:  Fever. EXAM: PORTABLE CHEST 1 VIEW COMPARISON:  09/16/2023 FINDINGS: Low volume film. The cardio pericardial silhouette is enlarged. Tracheostomy tube remains in place. Right-sided Port-A-Cath tip overlies the right atrium. Left PICC line tip overlies the upper right atrium. But The lungs are clear without focal pneumonia, edema, pneumothorax or pleural effusion. Telemetry leads overlie the chest. IMPRESSION:  Low volume film without acute cardiopulmonary findings. Electronically Signed   By: Camellia Candle M.D.   On: 02/26/2024 06:37    Labs:  CBC: Recent Labs    06/05/23 0350 09/16/23 1846 02/26/24 0424 02/26/24 0737 02/26/24 1005  WBC 11.4* 6.5 8.0  --  10.4  HGB 11.9* 11.6* 8.6* 11.9* 11.9*  HCT 35.7* 37.4* 31.0* 35.0* 37.0*  PLT 161 125* 165  --  151    COAGS: No results for input(s): INR, APTT in the last 8760 hours.  BMP: Recent Labs    09/16/23 1846 11/11/23 0000 02/26/24 0424 02/26/24 0703 02/26/24 0726 02/26/24 0737  NA 136  --  114* DUP 134* 137  K 4.0  --  >7.5* DUP 3.5 3.5  CL 107  --  78* DUP 105  --   CO2 19*  --  13* DUP 17*  --   GLUCOSE 158*  --  >1,200* DUP 170*  --   BUN 14  --  5* DUP 7  --   CALCIUM  9.6  --  8.8* DUP 9.5  --   CREATININE <0.30*  --  0.44* DUP <0.30*  --   GFRNONAA NOT CALCULATED  --  >60 DUP NOT CALCULATED  --    GFRAA  --  160  --  DUP  --   --     LIVER FUNCTION TESTS: Recent Labs    09/16/23 1846 02/26/24 0424 02/26/24 0703 02/26/24 0726  BILITOT 1.9* 2.6* DUP 3.8*  AST 110* 120* DUP 162*  ALT 150* 117* DUP 163*  ALKPHOS 258* 313* DUP 472*  PROT 8.5* 6.0* DUP 8.4*  ALBUMIN 3.8 2.3* DUP 3.3*    TUMOR MARKERS: No results for input(s): AFPTM, CEA, CA199, CHROMGRNA in the last 8760 hours.  Assessment and Plan:  Request for  image guided percutaneous cholecystostomy approved by Dr. Johann for today. No contraindications for procedure identified in ROS, physical exam, or review of pre-sedation considerations. Bili 3.8 8/24 CT abd imaging available and reviewed Febrile, tachycardic, BP stable Pt does not take a blood thinner Abx - has had vanc, flagyl , and cefepime  today in ER   Risks and benefits discussed with the patient's wife including, but not limited to bleeding, infection, gallbladder perforation, bile leak, sepsis or even death.  All of the patient's questions were answered, patient's wife is agreeable to proceed. Consent signed and in chart.   Thank you for allowing our service to participate in Jerry [name] 's care.    Electronically Signed: Laymon Coast, NP   02/26/2024, 2:42 PM     I spent a total of 20 Minutes    in face to face in clinical consultation, greater than 50% of which was counseling/coordinating care for image guided perc chole   (A copy of this note was sent to the referring provider and the time of visit.)

## 2024-02-26 NOTE — ED Notes (Signed)
 Per PA need labs back prior to CT scan to assess kidney function

## 2024-02-26 NOTE — Consult Note (Addendum)
 NAME:  Jerry [name], MRN:  [id number], DOB:  07/11/1970, LOS: 0 ADMISSION DATE:  02/26/2024, CONSULTATION DATE: 02/22/2024 REFERRING MD:  Ruthe Cornet, , CHIEF COMPLAINT: Abdominal pain, nausea and vomiting  History of Present Illness:  53 year old male with advanced ALS, status post trach, vent dependent/PEG, on TPN due to not being able to tolerate tube feeds, who is being cared at home by his wife, was brought into the emergency department with complaint of abdominal pain, nausea and vomiting since yesterday.  Per patient's wife he was complaining of abdominal pain, had 3 episodes of nonbilious vomiting so she brought him here in the emergency department.  On workup he was noted to have possible acute cholecystitis, seen by general surgery recommend percutaneous cholecystostomy tube drainage.  PCCM was consulted for help evaluation and ventilator management Of note patient is spiking fever Tmax 101.7  Pertinent  Medical History   Past Medical History:  Diagnosis Date   ALS (amyotrophic lateral sclerosis) (HCC)    COVID-19 virus infection 06/2020   Meningitis 2003   spinal   Morbid obesity (HCC)    Neuromuscular disorder (HCC)    Prediabetes 11/05/2016   A1C 5.7 on 11/05/16   Type 2 diabetes mellitus (HCC) 08/31/2023    Significant Hospital Events: Including procedures, antibiotic start and stop dates in addition to other pertinent events     Interim History / Subjective:  As above  Objective    Blood pressure (!) 143/88, pulse (!) 104, temperature (!) 101.3 F (38.5 C), temperature source Oral, resp. rate 16, SpO2 100%.    FiO2 (%):  [28 %] 28 % Set Rate:  [16 bmp] 16 bmp Vt Set:  [500 mL] 500 mL PEEP:  [5 cmH20] 5 cmH20 Plateau Pressure:  [18 cmH20] 18 cmH20   Intake/Output Summary (Last 24 hours) at 02/26/2024 1412 Last data filed at 02/26/2024 1411 Gross per 24 hour  Intake 2100 ml  Output --  Net 2100 ml   There were no vitals filed for this  visit.  Examination: General: Chronically ill-appearing male, lying on the bed HEENT: [place]/AT, eyes anicteric.  moist mucus membranes.  Status post trach, vent dependent Neuro: Opens eyes with vocal stimuli, curled up Chest: Bilateral rhonchorous breath sounds, no wheezes Heart: Regular rate and rhythm, no murmurs or gallops Abdomen: Soft, nontender, nondistended, bowel sounds present  Labs and images reviewed   Resolved problem list   Assessment and Plan  Chronic hypoxic respiratory failure status post trach, vent dependent Advanced ALS Dysphagia status post PEG, not tolerating tube feeds, currently on TPN Sepsis due to Acute cholecystitis s/p Perc cholecystostomy tube placement by IR  Continue on protective ventilation VAP prevention bundle in place Continue routine trach care Continue TPN Seen by general surgery, recommend percutaneous cholecystostomy tube Continue antibiotics and IVF F/u Cx   Labs   CBC: Recent Labs  Lab 02/26/24 0424 02/26/24 0737 02/26/24 1005  WBC 8.0  --  10.4  NEUTROABS 6.6  --  8.2*  HGB 8.6* 11.9* 11.9*  HCT 31.0* 35.0* 37.0*  MCV 99.0  --  84.3  PLT 165  --  151    Basic Metabolic Panel: Recent Labs  Lab 02/26/24 0424 02/26/24 0703 02/26/24 0726 02/26/24 0737  NA 114* DUP 134* 137  K >7.5* DUP 3.5 3.5  CL 78* DUP 105  --   CO2 13* DUP 17*  --   GLUCOSE >1,200* DUP 170*  --   BUN 5* DUP 7  --   CREATININE  0.44* DUP <0.30*  --   CALCIUM  8.8* DUP 9.5  --    GFR: CrCl cannot be calculated (This lab value cannot be used to calculate CrCl because it is not a number: <0.30). Recent Labs  Lab 02/26/24 0424 02/26/24 0437 02/26/24 0737 02/26/24 1005  WBC 8.0  --   --  10.4  LATICACIDVEN  --  0.6 2.3*  --     Liver Function Tests: Recent Labs  Lab 02/26/24 0424 02/26/24 0703 02/26/24 0726  AST 120* DUP 162*  ALT 117* DUP 163*  ALKPHOS 313* DUP 472*  BILITOT 2.6* DUP 3.8*  PROT 6.0* DUP 8.4*  ALBUMIN 2.3* DUP 3.3*    Recent Labs  Lab 02/26/24 0424  LIPASE 20   No results for input(s): AMMONIA in the last 168 hours.  ABG    Component Value Date/Time   PHART 7.52 (H) 01/09/2022 1810   PCO2ART 26 (L) 01/09/2022 1810   PO2ART 66 (L) 01/09/2022 1810   HCO3 19.2 (L) 02/26/2024 0737   TCO2 20 (L) 02/26/2024 0737   ACIDBASEDEF 3.0 (H) 02/26/2024 0737   O2SAT 94 02/26/2024 0737     Coagulation Profile: No results for input(s): INR, PROTIME in the last 168 hours.  Cardiac Enzymes: No results for input(s): CKTOTAL, CKMB, CKMBINDEX, TROPONINI in the last 168 hours.  HbA1C: Hgb A1c MFr Bld  Date/Time Value Ref Range Status  12/09/2021 11:05 AM 5.4 4.8 - 5.6 % Final    Comment:    (NOTE) Pre diabetes:          5.7%-6.4%  Diabetes:              >6.4%  Glycemic control for   <7.0% adults with diabetes   10/03/2018 10:37 AM 6.0 (H) <5.7 % of total Hgb Final    Comment:    For someone without known diabetes, a hemoglobin  A1c value between 5.7% and 6.4% is consistent with prediabetes and should be confirmed with a  follow-up test. . For someone with known diabetes, a value <7% indicates that their diabetes is well controlled. A1c targets should be individualized based on duration of diabetes, age, comorbid conditions, and other considerations. . This assay result is consistent with an increased risk of diabetes. . Currently, no consensus exists regarding use of hemoglobin A1c for diagnosis of diabetes for children. .     CBG: Recent Labs  Lab 02/26/24 0708  GLUCAP 176*    Review of Systems:   Unable to obtain as patient is ventilator dependent  Past Medical History:  He,  has a past medical history of ALS (amyotrophic lateral sclerosis) (HCC), COVID-19 virus infection (06/2020), Meningitis (2003), Morbid obesity (HCC), Neuromuscular disorder (HCC), Prediabetes (11/05/2016), and Type 2 diabetes mellitus (HCC) (08/31/2023).   Surgical History:   Past  Surgical History:  Procedure Laterality Date   IR REPLACE G-TUBE SIMPLE WO FLUORO  06/22/2022   MASS EXCISION Right 11/26/2014   Procedure: EXCISION MASS/GROIN EXCISION MASS;  Surgeon: Reyes LELON Cota, MD;  Location: [facility] ORS;  Service: General;  Laterality: Right;   MASS EXCISION Right 11/26/2014   Procedure: MINOR EXCISION OF MASS/POST THIGH MASS;  Surgeon: Reyes LELON Cota, MD;  Location: [facility] ORS;  Service: General;  Laterality: Right;   PEG PLACEMENT  10/27/2021   PORTACATH PLACEMENT Right 06/19/2019     Social History:   reports that he has never smoked. He has never used smokeless tobacco. He reports that he does not drink alcohol  and does not  use drugs.   Family History:  His family history includes Diabetes in his brother, father, mother, sister, sister, and sister; Fibromyalgia in his sister; Hypertension in his sister.   Allergies Allergies  Allergen Reactions   Crab [Shellfish Allergy] Rash     Home Medications  Prior to Admission medications   Medication Sig Start Date End Date Taking? Authorizing Provider  cetirizine  HCl (ZYRTEC ) 5 MG/5ML SOLN Take 10 mLs (10 mg total) by mouth at bedtime. 07/27/23   Sowles, Krichna, MD  glycopyrrolate  (ROBINUL ) 1 MG tablet Place 1 tablet (1 mg total) into feeding tube 2 (two) times daily as needed. 07/27/23   Sowles, Krichna, MD  ipratropium-albuterol  (DUONEB) 0.5-2.5 (3) MG/3ML SOLN USE 3 ML VIA NEBULIZER EVERY 4 HOURS AS NEEDED 11/04/23   Glenard, Krichna, MD  polyethylene glycol powder (GLYCOLAX /MIRALAX ) 17 GM/SCOOP powder Place 17 g into feeding tube daily. Titrate as needed for constipation 07/27/23   Sowles, Krichna, MD  Polyvinyl Alcohol -Povidone (CLEAR EYES NATURAL TEARS) 5-6 MG/ML SOLN Apply 1 drop to eye 3 (three) times daily as needed. 07/27/23   Sowles, Krichna, MD  Potassium Chloride  40 MEQ/15ML (20%) SOLN TAKE 15 ML BY MOUTH TWICE DAILY Patient not taking: Reported on 01/24/2024 08/26/23   Sowles, Krichna, MD  PRESCRIPTION  MEDICATION Home TPN    [provider]  scopolamine  (TRANSDERM-SCOP) 1 MG/3DAYS PLACE 1 PATCH ONTO THE SKIN EVERY 3 DAYS 08/18/23   Sowles, Krichna, MD  triamcinolone  cream (KENALOG ) 0.1 % Apply 1 Application topically 2 (two) times daily. 07/27/23   Sowles, Krichna, MD       Valinda Novas, MD [facility] See Amion for pager If no response to pager, please call [phone] until 7pm After 7pm, Please call E-link [phone]

## 2024-02-26 NOTE — Progress Notes (Signed)
 Reviewed case as hospitalist medical director on call.  Patient with ALS, lives in [place], on chronic vent via trach.  Presented with abdominal pain, vomiting, found to have fever, tachycardia, cholecystitis.  The patient will be admitted to ICU on vent with plans for antibiotics and cholecystostomy tube placement.  At present, he requires ICU level care and is outside the scope of care for the hospitalist service until stabilized.

## 2024-02-26 NOTE — Progress Notes (Signed)
 Transported pt from ED to CT and then back to ED without complications

## 2024-02-26 NOTE — ED Notes (Signed)
 Hold on ordered meds until CMP results per provider.

## 2024-02-26 NOTE — ED Triage Notes (Signed)
 Pt BIB [facility] from home. Pts wife reports pt is having generalized abd pain with distention and 2 episodes vomiting at 1600 and 0200.    Pt is trach and vent dependent; wife is main caregiver.

## 2024-02-26 NOTE — ED Notes (Signed)
 New labs sent to lab. Lab called by RN and informed to run new samples instead of previous.

## 2024-02-26 NOTE — Consult Note (Addendum)
 Reason for Consult:nausea Referring Provider: Clair Alfieri is an 53 y.o. male.  HPI: 53 yo male with ALS presented for abdominal pain and nausea. Pain has been in the right side for the last 3-5 days. He had 3 bouts of nausea in the last 24 h which is abnormal. He has been on TPN for the last 2 years after a hospitalization for ileus. He has bowel movements and is able to tolerate tablets with some protein supplementation. His wife is interested in restarted tube feeds but has not worked with someone on a plan yet.  Past Medical History:  Diagnosis Date   ALS (amyotrophic lateral sclerosis) (HCC)    COVID-19 virus infection 06/2020   Meningitis 2003   spinal   Morbid obesity (HCC)    Neuromuscular disorder (HCC)    Prediabetes 11/05/2016   A1C 5.7 on 11/05/16   Type 2 diabetes mellitus (HCC) 08/31/2023    Past Surgical History:  Procedure Laterality Date   IR REPLACE G-TUBE SIMPLE WO FLUORO  06/22/2022   MASS EXCISION Right 11/26/2014   Procedure: EXCISION MASS/GROIN EXCISION MASS;  Surgeon: Reyes LELON Cota, MD;  Location: [facility] ORS;  Service: General;  Laterality: Right;   MASS EXCISION Right 11/26/2014   Procedure: MINOR EXCISION OF MASS/POST THIGH MASS;  Surgeon: Reyes LELON Cota, MD;  Location: [facility] ORS;  Service: General;  Laterality: Right;   PEG PLACEMENT  10/27/2021   PORTACATH PLACEMENT Right 06/19/2019    Family History  Problem Relation Age of Onset   Diabetes Father    Diabetes Mother    Diabetes Sister    Hypertension Sister    Diabetes Brother    Diabetes Sister    Fibromyalgia Sister    Diabetes Sister     Social History:  reports that he has never smoked. He has never used smokeless tobacco. He reports that he does not drink alcohol  and does not use drugs.  Allergies:  Allergies  Allergen Reactions   Crab [Shellfish Allergy] Rash    Medications: I have reviewed the patient's current medications.  Results for orders placed or  performed during the hospital encounter of 02/26/24 (from the past 48 hours)  Urinalysis, Routine w reflex microscopic -Urine, Catheterized     Status: Abnormal   Collection Time: 02/26/24  3:43 AM  Result Value Ref Range   Color, Urine AMBER (A) YELLOW    Comment: BIOCHEMICALS MAY BE AFFECTED BY COLOR   APPearance HAZY (A) CLEAR   Specific Gravity, Urine >1.046 (H) 1.005 - 1.030   pH 7.0 5.0 - 8.0   Glucose, UA 50 (A) NEGATIVE mg/dL   Hgb urine dipstick NEGATIVE NEGATIVE   Bilirubin Urine NEGATIVE NEGATIVE   Ketones, ur 5 (A) NEGATIVE mg/dL   Protein, ur 30 (A) NEGATIVE mg/dL   Nitrite NEGATIVE NEGATIVE   Leukocytes,Ua MODERATE (A) NEGATIVE   RBC / HPF 0-5 0 - 5 RBC/hpf   WBC, UA 21-50 0 - 5 WBC/hpf   Bacteria, UA NONE SEEN NONE SEEN   Squamous Epithelial / HPF 0-5 0 - 5 /HPF   Mucus PRESENT    Budding Yeast PRESENT     Comment: Performed at [facility], [address] [place], [place] [address]  CBC with Differential     Status: Abnormal   Collection Time: 02/26/24  4:24 AM  Result Value Ref Range   WBC 8.0 4.0 - 10.5 K/uL   RBC 3.13 (L) 4.22 - 5.81 MIL/uL  Hemoglobin 8.6 (L) 13.0 - 17.0 g/dL   HCT 68.9 (L) 60.9 - 47.9 %   MCV 99.0 80.0 - 100.0 fL   MCH 27.5 26.0 - 34.0 pg   MCHC 27.7 (L) 30.0 - 36.0 g/dL   RDW 81.9 (H) 88.4 - 84.4 %   Platelets 165 150 - 400 K/uL   nRBC 0.0 0.0 - 0.2 %   Neutrophils Relative % 83 %   Neutro Abs 6.6 1.7 - 7.7 K/uL   Lymphocytes Relative 12 %   Lymphs Abs 1.0 0.7 - 4.0 K/uL   Monocytes Relative 5 %   Monocytes Absolute 0.4 0.1 - 1.0 K/uL   Eosinophils Relative 0 %   Eosinophils Absolute 0.0 0.0 - 0.5 K/uL   Basophils Relative 0 %   Basophils Absolute 0.0 0.0 - 0.1 K/uL   Immature Granulocytes 0 %   Abs Immature Granulocytes 0.02 0.00 - 0.07 K/uL    Comment: Performed at [facility], [address], [place], [place] [address]  Comprehensive metabolic panel     Status: Abnormal   Collection Time: 02/26/24  4:24 AM   Result Value Ref Range   Sodium 114 (LL) 135 - 145 mmol/L    Comment: CRITICAL RESULT CALLED TO, READ BACK BY AND VERIFIED WITH T. YOUNG, RN AT [phone] 08.24.25 JLASIGAN REPEATED TO VERIFY, OKAY TO RELEASE PER RN    Potassium >7.5 (HH) 3.5 - 5.1 mmol/L    Comment: CRITICAL RESULT CALLED TO, READ BACK BY AND VERIFIED WITH T. YOUNG, RN AT [phone] 08.24.25 JLASIGAN REPEATED TO VERIFY, OKAY TO RELEASE PER RN    Chloride 78 (L) 98 - 111 mmol/L   CO2 13 (L) 22 - 32 mmol/L   Glucose, Bld >1,200 (HH) 70 - 99 mg/dL    Comment: CRITICAL RESULT CALLED TO, READ BACK BY AND VERIFIED WITH T. YOUNG, RN AT [phone] 08.24.25 JLASIGAN REPEATED TO VERIFY, OKAY TO RELEASE PER RN Glucose reference range applies only to samples taken after fasting for at least 8 hours.    BUN 5 (L) 6 - 20 mg/dL   Creatinine, Ser 9.55 (L) 0.61 - 1.24 mg/dL   Calcium  8.8 (L) 8.9 - 10.3 mg/dL   Total Protein 6.0 (L) 6.5 - 8.1 g/dL   Albumin 2.3 (L) 3.5 - 5.0 g/dL   AST 879 (H) 15 - 41 U/L   ALT 117 (H) 0 - 44 U/L   Alkaline Phosphatase 313 (H) 38 - 126 U/L   Total Bilirubin 2.6 (H) 0.0 - 1.2 mg/dL   GFR, Estimated >39 >39 mL/min    Comment: (NOTE) Calculated using the CKD-EPI Creatinine Equation (2021)    Anion gap 23 (H) 5 - 15    Comment: ELECTROLYTES REPEATED TO VERIFY Performed at [facility], [address] [place], [place] [address]   Lipase, blood     Status: None   Collection Time: 02/26/24  4:24 AM  Result Value Ref Range   Lipase 20 11 - 51 U/L    Comment: Performed at [facility], [address], [place], [place] [address]  I-Stat CG4 Lactic Acid     Status: None   Collection Time: 02/26/24  4:37 AM  Result Value Ref Range   Lactic Acid, Venous 0.6 0.5 - 1.9 mmol/L  Comprehensive metabolic panel     Status: None   Collection Time: 02/26/24  7:03 AM  Result Value Ref Range   Sodium DUP 135 - 145 mmol/L  Potassium DUP 3.5 - 5.1 mmol/L   Chloride DUP 98 - 111 mmol/L   CO2 DUP 22 - 32 mmol/L    Glucose, Bld DUP 70 - 99 mg/dL   BUN DUP 6 - 20 mg/dL   Creatinine, Ser DUP 9.38 - 1.24 mg/dL   Calcium  DUP 8.9 - 10.3 mg/dL   Total Protein DUP 6.5 - 8.1 g/dL   Albumin DUP 3.5 - 5.0 g/dL   AST DUP 15 - 41 U/L   ALT DUP 0 - 44 U/L   Alkaline Phosphatase DUP 38 - 126 U/L   Total Bilirubin DUP 0.0 - 1.2 mg/dL   GFR, Estimated DUP >39 mL/min   GFR calc Af Amer DUP >60 mL/min   Anion gap DUP 5 - 15    Comment: Performed at [facility], [address] [place], [place] [address]  CBG monitoring, ED     Status: Abnormal   Collection Time: 02/26/24  7:08 AM  Result Value Ref Range   Glucose-Capillary 176 (H) 70 - 99 mg/dL    Comment: Glucose reference range applies only to samples taken after fasting for at least 8 hours.  Beta-hydroxybutyric acid     Status: None   Collection Time: 02/26/24  7:26 AM  Result Value Ref Range   Beta-Hydroxybutyric Acid 0.10 0.05 - 0.27 mmol/L    Comment: Performed at [facility], [address] [place], [place] [address]  Comprehensive metabolic panel     Status: Abnormal   Collection Time: 02/26/24  7:26 AM  Result Value Ref Range   Sodium 134 (L) 135 - 145 mmol/L    Comment: DELTA CHECK NOTED   Potassium 3.5 3.5 - 5.1 mmol/L   Chloride 105 98 - 111 mmol/L   CO2 17 (L) 22 - 32 mmol/L   Glucose, Bld 170 (H) 70 - 99 mg/dL    Comment: Glucose reference range applies only to samples taken after fasting for at least 8 hours.   BUN 7 6 - 20 mg/dL   Creatinine, Ser <9.69 (L) 0.61 - 1.24 mg/dL   Calcium  9.5 8.9 - 10.3 mg/dL   Total Protein 8.4 (H) 6.5 - 8.1 g/dL   Albumin 3.3 (L) 3.5 - 5.0 g/dL   AST 837 (H) 15 - 41 U/L   ALT 163 (H) 0 - 44 U/L   Alkaline Phosphatase 472 (H) 38 - 126 U/L   Total Bilirubin 3.8 (H) 0.0 - 1.2 mg/dL   GFR, Estimated NOT CALCULATED >60 mL/min    Comment: (NOTE) Calculated using the CKD-EPI Creatinine Equation (2021)    Anion gap 12 5 - 15    Comment: Performed at [facility], [address],  [place], [place] [address]  I-Stat CG4 Lactic Acid     Status: Abnormal   Collection Time: 02/26/24  7:37 AM  Result Value Ref Range   Lactic Acid, Venous 2.3 (HH) 0.5 - 1.9 mmol/L   Comment NOTIFIED PHYSICIAN   I-Stat venous blood gas, ([facility], [facility], [facility])     Status: Abnormal   Collection Time: 02/26/24  7:37 AM  Result Value Ref Range   pH, Ven 7.465 (H) 7.25 - 7.43   pCO2, Ven 26.7 (L) 44 - 60 mmHg   pO2, Ven 65 (H) 32 - 45 mmHg   Bicarbonate 19.2 (L) 20.0 - 28.0 mmol/L   TCO2 20 (L) 22 - 32 mmol/L   O2 Saturation 94 %   Acid-base deficit 3.0 (H) 0.0 -  2.0 mmol/L   Sodium 137 135 - 145 mmol/L   Potassium 3.5 3.5 - 5.1 mmol/L   Calcium , Ion 1.15 1.15 - 1.40 mmol/L   HCT 35.0 (L) 39.0 - 52.0 %   Hemoglobin 11.9 (L) 13.0 - 17.0 g/dL   Sample type VENOUS   CBC with Differential     Status: Abnormal   Collection Time: 02/26/24 10:05 AM  Result Value Ref Range   WBC 10.4 4.0 - 10.5 K/uL   RBC 4.39 4.22 - 5.81 MIL/uL   Hemoglobin 11.9 (L) 13.0 - 17.0 g/dL    Comment: REPEATED TO VERIFY   HCT 37.0 (L) 39.0 - 52.0 %   MCV 84.3 80.0 - 100.0 fL    Comment: REPEATED TO VERIFY DELTA CHECK NOTED    MCH 27.1 26.0 - 34.0 pg   MCHC 32.2 30.0 - 36.0 g/dL   RDW 82.9 (H) 88.4 - 84.4 %   Platelets 151 150 - 400 K/uL   nRBC 0.0 0.0 - 0.2 %   Neutrophils Relative % 79 %   Neutro Abs 8.2 (H) 1.7 - 7.7 K/uL   Lymphocytes Relative 11 %   Lymphs Abs 1.1 0.7 - 4.0 K/uL   Monocytes Relative 6 %   Monocytes Absolute 0.7 0.1 - 1.0 K/uL   Eosinophils Relative 3 %   Eosinophils Absolute 0.3 0.0 - 0.5 K/uL   Basophils Relative 0 %   Basophils Absolute 0.0 0.0 - 0.1 K/uL   Immature Granulocytes 1 %   Abs Immature Granulocytes 0.05 0.00 - 0.07 K/uL    Comment: Performed at [facility], [address] [place], [place] [address]    PE Blood pressure 131/77, pulse 96, temperature (!) 101.7 F (38.7 C), temperature source Oral, resp. rate 16, SpO2 100%. Constitutional: NAD; conversant; no  deformities Eyes: Moist conjunctiva; no lid lag; anicteric; PERRL Neck: Trachea midline; no thyromegaly Lungs: Normal respiratory effort; no tactile fremitus CV: RRR; no palpable thrills; no pitting edema GI: Abd soft, TTP RUQ; no palpable hepatosplenomegaly MSK: diffuse weakness; no clubbing/cyanosis Psychiatric: Appropriate affect; alert and oriented x3 Lymphatic: No palpable cervical or axillary lymphadenopathy Skin: No major subcutaneous nodules. Warm and dry   Assessment/Plan: 53 yo male who is vent dependent from ALS. He has been on TPN for 2 years (specifically August of 2023). I reviewed notes by Meliton Monte discussing chronic ileus and not tolerating TFs. I reviewed CT images of this visit and May showing dilation of the gallbladder. The CT from today shows more fluid around the gallbladder as well as a calcified layer concerning for sludge. He does have pain over the area on exam and has LFT elevations though that appears more chronic and could also be associated with TPN hepatic dysfunction. -IV abx -Given symptoms I think it would be reasonable to proceed toward cholecystostomy tube for this treatment. I will work with radiology for this goal.   I reviewed last 24 h vitals and pain scores, last 48 h intake and output, last 24 h labs and trends, and last 24 h imaging results.  This care required high  level of medical decision making.   Abram Sax Justis Closser 02/26/2024, 1:43 PM

## 2024-02-26 NOTE — ED Notes (Signed)
 Lab called again regarding labs drawn by RN at 720 not being in process. Lab stated they would run them at this time

## 2024-02-26 NOTE — Procedures (Signed)
  Procedure:  US /fluoro guided cholecystostomy catheter placement 35f Preprocedure diagnosis: The encounter diagnosis was Acute cholecystitis. Postprocedure diagnosis: same EBL:    minimal Complications:   none immediate  See full dictation in [employer].  CHARM Toribio Faes MD Main # [phone] Pager  [phone] Mobile [phone]

## 2024-02-26 NOTE — Progress Notes (Signed)
 ED Pharmacy Antibiotic Sign Off An antibiotic consult was received from an ED provider for vancomycin  and cefepime  per pharmacy dosing for sepsis. A chart review was completed to assess appropriateness.   The following one time order(s) were placed:  Vancomycin  1250 mg IV x 1 Cefepime  2g IV x 1  Further antibiotic and/or antibiotic pharmacy consults should be ordered by the admitting provider if indicated.   Thank you for allowing pharmacy to be a part of this patient's care.   Dorn Poot, [facility]  Clinical Pharmacist 02/26/24 12:00 PM

## 2024-02-26 NOTE — ED Notes (Signed)
 Hold on obtaining urine sample per provider

## 2024-02-26 NOTE — ED Notes (Signed)
 Patient transported to IR

## 2024-02-26 NOTE — H&P (Signed)
 Please see my consult note.   Valinda Novas, MD

## 2024-02-26 NOTE — Hospital Course (Signed)
 AL trach and vent dependant.  Acute cholecystitis.

## 2024-02-26 NOTE — Progress Notes (Signed)
 Patient transported from ED Resus to 2M12 with no complications noted.

## 2024-02-26 NOTE — Progress Notes (Addendum)
 eLink Physician-Brief Progress Note Patient Name: Jerry [name] DOB: [birth year] MRN: [id number]   Date of Service  02/26/2024  HPI/Events of Note  K 2.2  eICU Interventions  KCL IV   2203 - Add Zofran  for N/V  Intervention Category Minor Interventions: Electrolytes abnormality - evaluation and management  [name] 02/26/2024, 7:59 PM

## 2024-02-26 NOTE — ED Notes (Signed)
Patient transported to CT with RN and RT

## 2024-02-26 NOTE — Progress Notes (Signed)
 Pharmacy Antibiotic Note  Jerry [name] is a 53 y.o. male admitted on 02/26/2024 presenting with abdominal pain, concern for intra-abdominal infection, hx ALS, trach/PEG on vent.  Pharmacy has been consulted for vancomycin  dosing.  Vancomycin  1250 mg IV x 1 given in ED  Plan: Vancomycin  750 mg IV q 12h (eAUC 515) Monitor renal function, Cx and clinical progression to narrow Vancomycin  levels as indicated    Temp (24hrs), Avg:100.2 F (37.9 C), Min:98 F (36.7 C), Max:101.7 F (38.7 C)  Recent Labs  Lab 02/26/24 0424 02/26/24 0437 02/26/24 0703 02/26/24 0726 02/26/24 0737 02/26/24 1005  WBC 8.0  --   --   --   --  10.4  CREATININE 0.44*  --  DUP <0.30*  --   --   LATICACIDVEN  --  0.6  --   --  2.3*  --     CrCl cannot be calculated (This lab value cannot be used to calculate CrCl because it is not a number: <0.30).    Allergies  Allergen Reactions   Georgianne Cerise Allergy] Rash    Dorn Poot, PharmD, [facility] Clinical Pharmacist ED Pharmacist Phone # [phone] 02/26/2024 4:30 PM

## 2024-02-26 NOTE — ED Provider Notes (Signed)
 Jerry [name] EMERGENCY DEPARTMENT AT [facility] Provider Note   CSN: [id number] Arrival date & time: 02/26/24  9671   Patient presents with: Abdominal Pain  Jerry [name] is a 53 y.o. male with end-stage ALS, PEG tube and trach dependent on TPN through PICC line in the left arm who presents with his wife the bedside with concern for abdominal distention and worsening pain now 9 out of 10 for the last 2 days with 2 episodes of NBNB emesis today.  Patient communicates with communication assistive device with eye tracking which is present at the bedside.  He is answering all questions appropriately.  His wife is his primary caregiver and she is quite knowledgeable in his condition.  TPN running through his PICC line at time of evaluation.  Level 5 caveat due to complexity of patient's past medical history and nonverbal status.  He is not anticoagulated.   HPI     Prior to Admission medications   Medication Sig Start Date End Date Taking? Authorizing Provider  cetirizine  HCl (ZYRTEC ) 5 MG/5ML SOLN Take 10 mLs (10 mg total) by mouth at bedtime. 07/27/23   Sowles, Krichna, MD  glycopyrrolate  (ROBINUL ) 1 MG tablet Place 1 tablet (1 mg total) into feeding tube 2 (two) times daily as needed. 07/27/23   Sowles, Krichna, MD  ipratropium-albuterol  (DUONEB) 0.5-2.5 (3) MG/3ML SOLN USE 3 ML VIA NEBULIZER EVERY 4 HOURS AS NEEDED 11/04/23   Glenard, Krichna, MD  polyethylene glycol powder (GLYCOLAX /MIRALAX ) 17 GM/SCOOP powder Place 17 g into feeding tube daily. Titrate as needed for constipation 07/27/23   Sowles, Krichna, MD  Polyvinyl Alcohol -Povidone (CLEAR EYES NATURAL TEARS) 5-6 MG/ML SOLN Apply 1 drop to eye 3 (three) times daily as needed. 07/27/23   Sowles, Krichna, MD  Potassium Chloride  40 MEQ/15ML (20%) SOLN TAKE 15 ML BY MOUTH TWICE DAILY Patient not taking: Reported on 01/24/2024 08/26/23   Sowles, Krichna, MD  PRESCRIPTION MEDICATION Home TPN    [provider]  scopolamine   (TRANSDERM-SCOP) 1 MG/3DAYS PLACE 1 PATCH ONTO THE SKIN EVERY 3 DAYS 08/18/23   Sowles, Krichna, MD  triamcinolone  cream (KENALOG ) 0.1 % Apply 1 Application topically 2 (two) times daily. 07/27/23   Sowles, Krichna, MD    Allergies: Georgianne cerise allergy]    Review of Systems  Gastrointestinal:  Positive for abdominal distention, abdominal pain, constipation, nausea and vomiting.  Genitourinary: Negative.     Updated Vital Signs BP 139/85   Pulse 86   Temp 98 F (36.7 C)   Resp 16   SpO2 100%   Physical Exam Vitals and nursing note reviewed.  Constitutional:      Appearance: He is obese. He is not ill-appearing or toxic-appearing.  HENT:     Head: Normocephalic and atraumatic.     Mouth/Throat:     Mouth: Mucous membranes are moist.     Pharynx: No oropharyngeal exudate or posterior oropharyngeal erythema.  Eyes:     General:        Right eye: No discharge.        Left eye: No discharge.     Conjunctiva/sclera: Conjunctivae normal.     Pupils: Pupils are equal, round, and reactive to light.  Cardiovascular:     Rate and Rhythm: Normal rate and regular rhythm.     Pulses: Normal pulses.     Heart sounds: Normal heart sounds. No murmur heard. Pulmonary:     Effort: Pulmonary effort is normal. No respiratory distress.     Breath sounds:  Normal breath sounds. No wheezing or rales.  Abdominal:     General: Bowel sounds are normal. There is distension.     Palpations: Abdomen is soft.     Tenderness: There is generalized abdominal tenderness.  Musculoskeletal:        General: No deformity.     Cervical back: Neck supple.  Skin:    General: Skin is warm and dry.     Capillary Refill: Capillary refill takes less than 2 seconds.  Neurological:     Mental Status: He is alert. Mental status is at baseline.  Psychiatric:        Mood and Affect: Mood normal.     (all labs ordered are listed, but only abnormal results are displayed) Labs Reviewed  CBC WITH  DIFFERENTIAL/PLATELET - Abnormal; Notable for the following components:      Result Value   RBC 3.13 (*)    Hemoglobin 8.6 (*)    HCT 31.0 (*)    MCHC 27.7 (*)    RDW 18.0 (*)    All other components within normal limits  COMPREHENSIVE METABOLIC PANEL WITH GFR - Abnormal; Notable for the following components:   Sodium 114 (*)    Potassium >7.5 (*)    Chloride 78 (*)    CO2 13 (*)    Glucose, Bld >1,200 (*)    BUN 5 (*)    Creatinine, Ser 0.44 (*)    Calcium  8.8 (*)    Total Protein 6.0 (*)    Albumin 2.3 (*)    AST 120 (*)    ALT 117 (*)    Alkaline Phosphatase 313 (*)    Total Bilirubin 2.6 (*)    Anion gap 23 (*)    All other components within normal limits  CBG MONITORING, ED - Abnormal; Notable for the following components:   Glucose-Capillary 176 (*)    All other components within normal limits  LIPASE, BLOOD  URINALYSIS, ROUTINE W REFLEX MICROSCOPIC  COMPREHENSIVE METABOLIC PANEL WITH GFR  BETA-HYDROXYBUTYRIC ACID  I-STAT CG4 LACTIC ACID, ED  I-STAT CHEM 8, ED  I-STAT CG4 LACTIC ACID, ED  I-STAT VENOUS BLOOD GAS, ED    EKG: None  Radiology: DG Chest Portable 1 View Result Date: 02/26/2024 CLINICAL DATA:  Fever. EXAM: PORTABLE CHEST 1 VIEW COMPARISON:  09/16/2023 FINDINGS: Low volume film. The cardio pericardial silhouette is enlarged. Tracheostomy tube remains in place. Right-sided Port-A-Cath tip overlies the right atrium. Left PICC line tip overlies the upper right atrium. But The lungs are clear without focal pneumonia, edema, pneumothorax or pleural effusion. Telemetry leads overlie the chest. IMPRESSION: Low volume film without acute cardiopulmonary findings. Electronically Signed   By: Camellia Candle M.D.   On: 02/26/2024 06:37     Procedures   Medications Ordered in the ED  sodium chloride  flush (NS) 0.9 % injection 10-40 mL (has no administration in time range)  sodium chloride  flush (NS) 0.9 % injection 10-40 mL (has no administration in time range)   Chlorhexidine  Gluconate Cloth 2 % PADS 6 each (has no administration in time range)  fentaNYL  (SUBLIMAZE ) injection 50 mcg (50 mcg Intravenous Given 02/26/24 0516)  ondansetron  (ZOFRAN ) injection 4 mg (4 mg Intravenous Given 02/26/24 0515)  HYDROmorphone  (DILAUDID ) injection 0.5 mg (0.5 mg Intravenous Given 02/26/24 0716)                                    Medical  Decision Making 53 year old male with ALS who presents with abdominal distention and pain.  Hypertensive on intake, vitals otherwise normal.  Cardiopulmonary exam is unremarkable, abdominal exam with abdominal distention, decreased bowel sounds throughout, generalized abdominal tenderness to palpation.  Patient is trach dependent, quadriplegic, nonverbal, communicates via assistive device with his eyes.  DDx for abdominal distention includes but limited to constipation, ileus, bowel obstruction, perforated infection.  Amount and/or Complexity of Data Reviewed Labs: ordered.    Details: CBC with anemia with hemoglobin of 8.6 down from patient's baseline near 11.  CMP initially significantly deranged; confirmed with i-STAT Chem-8, however discrepancy between glucose on CMP which is greater than 1200 and bedside CBG in the emergency department was 176.  Labs were not drawn from the patient's PICC line but they were drawn from his port on the right side.  Will reevaluate labs with a straight stick on the opposite side from his PICC line before initiating treatment for hyperkalemia and hyperglycemic crisis. Radiology: ordered.  Risk OTC drugs. Prescription drug management.   Care of this patient signed out to oncoming ED provider C. Aberman, PA-C at time of shift change. All pertinent HPI, physical exam, and laboratory findings were discussed with them prior to my departure. Disposition of patient pending completion of workup, reevaluation, and clinical judgement of oncoming ED provider.   This chart was dictated using voice  recognition software, Dragon. Despite the best efforts of this provider to proofread and correct errors, errors may still occur which can change documentation meaning.      Final diagnoses:  None    ED Discharge Orders     None          Bobette Pleasant JONELLE DEVONNA 02/26/24 0725    Palumbo, April, MD 02/26/24 [phone]

## 2024-02-26 NOTE — Progress Notes (Signed)
 [facility] Liaison Note:   This patient is currently enrolled in [facility] outpatient-based palliative care and Home Based Primary Care.   Hospital Liaison will continue to follow for discharge disposition.   Please call for any outpatient based palliative care related questions or concerns.   Thank you,    Nat Babe, BSN, RN [facility] Liaison [phone]

## 2024-02-26 NOTE — ED Provider Notes (Signed)
 Care assumed from [facility], PA-C at shift change pending repeat labs and CT chest, abdomen, pelvis.  See her note for full HPI.  In short, patient is a 53 year old male with ALS who presents to the ED due to abdominal distention and worsening pain. Physical Exam  BP 139/85   Pulse 86   Temp 99.6 F (37.6 C) (Oral)   Resp 16   SpO2 100%   Physical Exam Vitals and nursing note reviewed.  Constitutional:      General: He is not in acute distress.    Appearance: He is not ill-appearing.  HENT:     Head: Normocephalic.  Eyes:     Pupils: Pupils are equal, round, and reactive to light.  Cardiovascular:     Rate and Rhythm: Normal rate and regular rhythm.     Pulses: Normal pulses.     Heart sounds: Normal heart sounds. No murmur heard.    No friction rub. No gallop.  Pulmonary:     Effort: Pulmonary effort is normal.     Breath sounds: Normal breath sounds.  Abdominal:     General: Abdomen is flat. There is no distension.     Palpations: Abdomen is soft.     Tenderness: There is abdominal tenderness. There is no guarding or rebound.     Comments: Diffuse tenderness. +abdominal distention  Musculoskeletal:        General: Normal range of motion.     Cervical back: Neck supple.  Skin:    General: Skin is warm and dry.  Neurological:     General: No focal deficit present.     Mental Status: He is alert.  Psychiatric:        Mood and Affect: Mood normal.        Behavior: Behavior normal.     Procedures  .Critical Care  Performed by: Lorelle Aleck BROCKS, PA-C Authorized by: Lorelle Aleck BROCKS, PA-C   Critical care provider statement:    Critical care time (minutes):  30   Critical care was necessary to treat or prevent imminent or life-threatening deterioration of the following conditions:  Sepsis   Critical care was time spent personally by me on the following activities:  Development of treatment plan with patient or surrogate, discussions with consultants,  evaluation of patient's response to treatment, examination of patient, ordering and review of laboratory studies, ordering and review of radiographic studies, ordering and performing treatments and interventions, pulse oximetry, re-evaluation of patient's condition and review of old charts   I assumed direction of critical care for this patient from another provider in my specialty: no     Care discussed with: admitting provider     ED Course / MDM   Clinical Course as of 02/26/24 1315  Sun Feb 26, 2024  0801 Labs redrawn per previous provider. Concern about accuracy of labs. Originally drawn from port. 2nd set of labs drawn as a straight stick.  [CA]  [id number] Called lab to inquire about lab results. They do not have new CMP. Informed RN. Supposedly previous labs were drawn from PICC where TPN was running which is likely the cause of the results. Awaiting new labs.  [CA]  [id number] Reassessed patient.  Patient resting comfortably in bed.  Awaiting for lab results. [CA]  [id number] Called lab to inquire about CMP results. Test running now. Tech notes lab will be delayed because they are running [facility]'s metabolic panels as well [CA]  8989 Lactic Acid, Venous(!!): 2.3 Lactic acid elevated. Previous  one normal. Awaiting further labs. IVFs started. [CA]  1055 RN called lab again to inquire about lab results. Still awaiting results to determine accuracy of first labs. Awaiting renal function to obtain CT abdomen for abdominal distention.  [CA]  1106 Reassessed patient at bedside.  Patient resting comfortably in bed.  Abdomen with diffuse tenderness.  Positive abdominal distention.  Patient had 1 episode of emesis.  Zofran  given.  Still awaiting CMP results. [CA]  1129 Labs resulted.  Normal potassium at 3.5.  Mild hyponatremia 134.  Does have transaminitis.  AST 162.  ALT 163.  Alk phos 472.  Total bilirubin 3.8.  RN called CT scan to bump patient to the top of the list.  Patient will require admission once CT scan  has resulted.  [CA]  1157 Temp(!): 101.7 F (38.7 C) Patient now febrile. IV antibiotics started  [CA]  1201 Additional L IVFs given due to new onset fever. Awaiting CT results.  [CA]  1230 Called radiology to expedite CT read  [CA]  1306 Discussed with Dr. Stevie with general surgery who will consult on patient. Will admit to ICU [CA]  1312 Discussed with Ronnald with ICU who will see patient. Question ICU admission vs. Hospitalist. ICU will determine after evaluation.  [CA]    Clinical Course User Index [CA] Lorelle Aleck BROCKS, PA-C   Medical Decision Making Amount and/or Complexity of Data Reviewed Independent Historian: caregiver    Details: Wife at bedside provided history External Data Reviewed: notes. Labs: ordered. Decision-making details documented in ED Course. Radiology: ordered and independent interpretation performed. Decision-making details documented in ED Course.  Risk OTC drugs. Prescription drug management. Decision regarding hospitalization.   Care assumed from [facility], PA-C at shift change pending repeat labs and CT chest, abdomen, pelvis.  See her note for full MDM.  At shift change, repeat labs pending.  There was a significant delay in lab results.  See notes above.  Patient with abdominal distention and diffuse tenderness on exam.  Checked on patient numerous times while awaiting labs and CT imaging.  During his ED stay, lactic acid increased to 2.3.  He also became febrile.  IV fluids and antibiotics started.  CMP with transaminitis.  AST 162.  ALT 163.  Alk phos 472.  Total bilirubin 3.8.  Lipase normal.  CBC with no leukocytosis.  CT demonstrates chronic gallbladder hydrops and suspicious findings for acute cholecystitis. Added IV flagyl . Discussed with Dr. Nicolas with general surgery who will see patient.   ICU will manage vent settings, requesting hospitalist admission.   2:45 PM Discussed with Dr. Tobie with [facility] who agrees to admit  patient   Lorelle Aleck BROCKS DEVONNA 02/26/24 1447    Ruthe Cornet, DO 02/26/24 1501

## 2024-02-26 NOTE — ED Notes (Signed)
 Lab called to add CBC on to labs drawn at 720

## 2024-02-27 DIAGNOSIS — J9610 Chronic respiratory failure, unspecified whether with hypoxia or hypercapnia: Secondary | ICD-10-CM | POA: Diagnosis not present

## 2024-02-27 DIAGNOSIS — Z930 Tracheostomy status: Secondary | ICD-10-CM | POA: Diagnosis not present

## 2024-02-27 DIAGNOSIS — J9611 Chronic respiratory failure with hypoxia: Secondary | ICD-10-CM

## 2024-02-27 DIAGNOSIS — R6520 Severe sepsis without septic shock: Secondary | ICD-10-CM

## 2024-02-27 DIAGNOSIS — A419 Sepsis, unspecified organism: Secondary | ICD-10-CM | POA: Diagnosis not present

## 2024-02-27 LAB — CBC
HCT: 34.9 % — ABNORMAL LOW (ref 39.0–52.0)
Hemoglobin: 11.5 g/dL — ABNORMAL LOW (ref 13.0–17.0)
MCH: 26.9 pg (ref 26.0–34.0)
MCHC: 33 g/dL (ref 30.0–36.0)
MCV: 81.5 fL (ref 80.0–100.0)
Platelets: 207 K/uL (ref 150–400)
RBC: 4.28 MIL/uL (ref 4.22–5.81)
RDW: 17.2 % — ABNORMAL HIGH (ref 11.5–15.5)
WBC: 18.8 K/uL — ABNORMAL HIGH (ref 4.0–10.5)
nRBC: 0 % (ref 0.0–0.2)

## 2024-02-27 LAB — COMPREHENSIVE METABOLIC PANEL WITH GFR
ALT: 108 U/L — ABNORMAL HIGH (ref 0–44)
AST: 85 U/L — ABNORMAL HIGH (ref 15–41)
Albumin: 2.7 g/dL — ABNORMAL LOW (ref 3.5–5.0)
Alkaline Phosphatase: 324 U/L — ABNORMAL HIGH (ref 38–126)
Anion gap: 10 (ref 5–15)
BUN: 8 mg/dL (ref 6–20)
CO2: 15 mmol/L — ABNORMAL LOW (ref 22–32)
Calcium: 8.9 mg/dL (ref 8.9–10.3)
Chloride: 109 mmol/L (ref 98–111)
Creatinine, Ser: 0.3 mg/dL — ABNORMAL LOW (ref 0.61–1.24)
GFR, Estimated: >60 mL/min (ref >60)
Glucose, Bld: 87 mg/dL (ref 70–99)
Potassium: 3.2 mmol/L — ABNORMAL LOW (ref 3.5–5.1)
Sodium: 134 mmol/L — ABNORMAL LOW (ref 135–145)
Total Bilirubin: 6.5 mg/dL — ABNORMAL HIGH (ref 0.0–1.2)
Total Protein: 7.6 g/dL (ref 6.5–8.1)

## 2024-02-27 LAB — GLUCOSE, CAPILLARY
Glucose-Capillary: 87 mg/dL (ref 70–99)
Glucose-Capillary: 82 mg/dL (ref 70–99)
Glucose-Capillary: 106 mg/dL — ABNORMAL HIGH (ref 70–99)
Glucose-Capillary: 105 mg/dL — ABNORMAL HIGH (ref 70–99)
Glucose-Capillary: 111 mg/dL — ABNORMAL HIGH (ref 70–99)
Glucose-Capillary: 108 mg/dL — ABNORMAL HIGH (ref 70–99)

## 2024-02-27 LAB — PROTIME-INR
INR: 1.4 — ABNORMAL HIGH (ref 0.8–1.2)
Prothrombin Time: 18.2 s — ABNORMAL HIGH (ref 11.4–15.2)

## 2024-02-27 LAB — CORTISOL-AM, BLOOD: Cortisol - AM: 40 ug/dL — ABNORMAL HIGH (ref 6.7–22.6)

## 2024-02-27 LAB — MAGNESIUM: Magnesium: 1.6 mg/dL — ABNORMAL LOW (ref 1.7–2.4)

## 2024-02-27 LAB — PHOSPHORUS: Phosphorus: 2 mg/dL — ABNORMAL LOW (ref 2.5–4.6)

## 2024-02-27 MED ORDER — POTASSIUM CHLORIDE 10 MEQ/50ML IV SOLN
10.0000 meq | INTRAVENOUS | Status: AC
  Administered 2024-02-27 (×4): 10 meq via INTRAVENOUS
  Filled 2024-02-27 (×4): qty 50

## 2024-02-27 MED ORDER — MAGNESIUM SULFATE 4 GM/100ML IV SOLN
4.0000 g | Freq: Once | INTRAVENOUS | Status: AC
  Administered 2024-02-27: 4 g via INTRAVENOUS
  Filled 2024-02-27: qty 100

## 2024-02-27 MED ORDER — ACETAMINOPHEN 650 MG RE SUPP: 650.0000 mg | Freq: Four times a day (QID) | RECTAL | Status: DC | PRN

## 2024-02-27 MED ORDER — FREE WATER
125.0000 mL | Status: DC
  Administered 2024-02-27 – 2024-02-29 (×20): 125 mL

## 2024-02-27 MED ORDER — POTASSIUM PHOSPHATES 15 MMOLE/5ML IV SOLN
15.0000 mmol | Freq: Once | INTRAVENOUS | Status: AC
  Administered 2024-02-27: 15 mmol via INTRAVENOUS
  Filled 2024-02-27: qty 5

## 2024-02-27 MED ORDER — ACETAMINOPHEN 325 MG PO TABS
650.0000 mg | ORAL_TABLET | Freq: Four times a day (QID) | ORAL | Status: DC | PRN
Start: 2024-02-27 — End: 2024-03-13
  Administered 2024-02-27 – 2024-03-10 (×9): 650 mg
  Filled 2024-02-27 (×9): qty 2

## 2024-02-27 NOTE — Progress Notes (Signed)
 Referring Physician(s): Dr. Stevie  Supervising Physician: Vanice Revel  Patient Status:  [facility] - In-pt  Chief Complaint:  S/p cholecystostomy tube placed 8/24 by Dr. Johann.   Subjective:  Patient is lying in bed, alert, and is now able to communicate using tablet system. He tells me that he is feeling much better though he still has been experiencing right sided abd pain.   Allergies: Crab [shellfish allergy]  Medications: Prior to Admission medications   Medication Sig Start Date End Date Taking? Authorizing Provider  cetirizine  HCl (ZYRTEC ) 5 MG/5ML SOLN Take 10 mLs (10 mg total) by mouth at bedtime. 07/27/23  Yes Sowles, Krichna, MD  glycopyrrolate  (ROBINUL ) 1 MG tablet Place 1 tablet (1 mg total) into feeding tube 2 (two) times daily as needed. Patient taking differently: Place 1 mg into feeding tube 2 (two) times daily. 07/27/23  Yes Sowles, Krichna, MD  ipratropium-albuterol  (DUONEB) 0.5-2.5 (3) MG/3ML SOLN USE 3 ML VIA NEBULIZER EVERY 4 HOURS AS NEEDED Patient taking differently: Inhale 3 mLs into the lungs every 4 (four) hours as needed. 11/04/23  Yes Sowles, Krichna, MD  polyethylene glycol powder (GLYCOLAX /MIRALAX ) 17 GM/SCOOP powder Place 17 g into feeding tube daily. Titrate as needed for constipation 07/27/23  Yes Sowles, Krichna, MD  Polyvinyl Alcohol -Povidone (CLEAR EYES NATURAL TEARS) 5-6 MG/ML SOLN Apply 1 drop to eye 3 (three) times daily as needed. 07/27/23  Yes Sowles, Krichna, MD  Potassium Chloride  40 MEQ/15ML (20%) SOLN TAKE 15 ML BY MOUTH TWICE DAILY 08/26/23  Yes Sowles, Krichna, MD  PRESCRIPTION MEDICATION Home TPN   Yes [provider]  scopolamine  (TRANSDERM-SCOP) 1 MG/3DAYS PLACE 1 PATCH ONTO THE SKIN EVERY 3 DAYS 08/18/23  Yes Sowles, Krichna, MD  triamcinolone  cream (KENALOG ) 0.1 % Apply 1 Application topically 2 (two) times daily. Patient not taking: Reported on 02/26/2024 07/27/23   [name], MD     Vital Signs: BP 92/69   Pulse  (!) 126   Temp (!) 101.9 F (38.8 C) (Oral)   Resp 16   Wt 170 lb 6.7 oz (77.3 kg)   SpO2 99%   BMI 28.36 kg/m   Physical Exam HENT:     Mouth/Throat:     Mouth: Mucous membranes are moist.  Cardiovascular:     Rate and Rhythm: Tachycardia present.  Pulmonary:     Comments: Trach to vent Abdominal:     Palpations: Abdomen is soft.     Tenderness: There is abdominal tenderness in the right upper quadrant and right lower quadrant.     Comments: Mild tenderness G tube present RUQ chole drain present, to gravity bag, scant output (dark green/brown bilious), insertion without erythema or leakage around tube, ext suture intact.   Skin:    General: Skin is warm and dry.  Neurological:     Mental Status: He is alert and oriented to person, place, and time.  Psychiatric:        Mood and Affect: Mood normal.        Behavior: Behavior normal.     Imaging: CT CHEST ABDOMEN PELVIS W CONTRAST Result Date: 02/26/2024 CLINICAL DATA:  53 year old male with sepsis. Abdominal distension. Hypertension. EXAM: CT CHEST, ABDOMEN, AND PELVIS WITH CONTRAST TECHNIQUE: Multidetector CT imaging of the chest, abdomen and pelvis was performed following the standard protocol during bolus administration of intravenous contrast. RADIATION DOSE REDUCTION: This exam was performed according to the departmental dose-optimization program which includes automated exposure control, adjustment of the mA and/or kV according to  patient size and/or use of iterative reconstruction technique. CONTRAST:  75mL OMNIPAQUE  IOHEXOL  350 MG/ML SOLN COMPARISON:  Portable chest 0419 hours today. CTA chest 06/05/2023 and CT Abdomen and Pelvis 09/16/2023. FINDINGS: CT CHEST FINDINGS Cardiovascular: Chronic right chest Port-A-Cath. Normal heart size. No pericardial effusion. Aberrant origin right subclavian artery, normal variant. Little to no thoracic atherosclerosis identified. Mediastinum/Nodes: Negative for mediastinal mass,  lymphadenopathy. Lungs/Pleura: Chronic tracheostomy is stable from last year. Small volume layering secretions in the trachea near the tube. Otherwise major airways are patent. Lung volumes are stable, chronic elevation of the left hemidiaphragm. Chronic left lung base atelectasis and small volume pleural effusion or pleural thickening, unchanged from last year. Mild respiratory motion. Mild contralateral dependent right lung atelectasis. No acute lung finding. Musculoskeletal: Stable.  No acute or suspicious osseous lesion. CT ABDOMEN PELVIS FINDINGS Hepatobiliary: Chronic gallbladder hydrops, but new perihepatic, pericholecystic free fluid (series 6, image 53). Layering sludge or stones series 3, image 83 is newly visible, and elsewhere the gallbladder wall appears indistinct (series 3, image 64). Liver enhancement remains normal. No bile duct dilatation. Pancreas: Stable atrophy. Spleen: Trace perisplenic free fluid is new. Otherwise stable and negative spleen. Adrenals/Urinary Tract: Normal adrenal glands. Bilateral nephrolithiasis. Symmetric renal enhancement and contrast excretion with no hydronephrosis or hydroureter. Distended urinary bladder. And there is a 5 mm calculus within the bladder now, to the left of midline dependently medial to the left UVJ. See series 3, image 104. The distal ureters and ureterovesical junctions are decompressed and negative. Stomach/Bowel: Featureless appearance of the rectosigmoid colon similar to March. Redundant large bowel. Upstream proximal sigmoid, descending colon are decompressed. Gas distended non dependent transverse colon also containing some fluid. Upstream right colon is decompressed. Mildly gas and stool distended cecum. Cecum is on a lax mesentery. Terminal ileum is decompressed. Gas containing appendix remains normal near the midline on series 3, image 86. Nondilated small bowel. Percutaneous gastrostomy tube is new. Air and fluid within the stomach.  Decompressed duodenum. No pneumoperitoneum identified. Free fluid with simple fluid density including tracking into the bilateral lower quadrants (coronal image 52). Vascular/Lymphatic: Normal caliber abdominal aorta. Major arterial structures, portal venous system are patent. No significant atherosclerosis. No lymphadenopathy. Reproductive: Negative. Other: No pelvis free fluid. Musculoskeletal: Stable. Chronic lower abdominal, pelvic and visible proximal lower extremity muscle atrophy which is diffuse, symmetric. No acute or suspicious osseous lesion. IMPRESSION: 1. Chronic Gallbladder Hydrops, but new simple density free fluid in the abdomen, indistinct appearance of the gallbladder wall, with layering sludge or stones. Constellation highly suspicious for Acute Cholecystitis. 2. No other definite acute or inflammatory process in the abdomen or pelvis; Redundant large bowel with featureless appearance of the rectosigmoid colon similar to March. No evidence of bowel obstruction.  New PEG tube.  Normal appendix. Bilateral nephrolithiasis, and new 5 mm urinary calculus within the bladder, but no acute obstructive uropathy. Chronic left lung base fibrothorax versus pleural effusion and atelectasis. Chronic tracheostomy. Generalized lower abdominal, pelvic, and proximal lower extremity muscle atrophy. Electronically Signed   By: VEAR Hurst M.D.   On: 02/26/2024 12:41   DG Chest Portable 1 View Result Date: 02/26/2024 CLINICAL DATA:  Fever. EXAM: PORTABLE CHEST 1 VIEW COMPARISON:  09/16/2023 FINDINGS: Low volume film. The cardio pericardial silhouette is enlarged. Tracheostomy tube remains in place. Right-sided Port-A-Cath tip overlies the right atrium. Left PICC line tip overlies the upper right atrium. But The lungs are clear without focal pneumonia, edema, pneumothorax or pleural effusion. Telemetry leads overlie the chest.  IMPRESSION: Low volume film without acute cardiopulmonary findings. Electronically Signed    By: Camellia Candle M.D.   On: 02/26/2024 06:37    Labs:  CBC: Recent Labs    09/16/23 1846 02/26/24 0424 02/26/24 0737 02/26/24 1005 02/27/24 0441  WBC 6.5 8.0  --  10.4 18.8*  HGB 11.6* 8.6* 11.9* 11.9* 11.5*  HCT 37.4* 31.0* 35.0* 37.0* 34.9*  PLT 125* 165  --  151 207    COAGS: Recent Labs    02/27/24 0441  INR 1.4*    BMP: Recent Labs    11/11/23 0000 02/26/24 0424 02/26/24 0703 02/26/24 0726 02/26/24 0737 02/26/24 1824 02/27/24 0441  NA  --    < > DUP 134* 137 133* 134*  K  --    < > DUP 3.5 3.5 2.2* 3.2*  CL  --    < > DUP 105  --  103 109  CO2  --    < > DUP 17*  --  17* 15*  GLUCOSE  --    < > DUP 170*  --  117* 87  BUN  --    < > DUP 7  --  5* 8  CALCIUM   --    < > DUP 9.5  --  8.9 8.9  CREATININE  --    < > DUP <0.30*  --  <0.30* 0.30*  GFRNONAA  --    < > DUP NOT CALCULATED  --  NOT CALCULATED >60  GFRAA 160  --  DUP  --   --   --   --    < > = values in this interval not displayed.    LIVER FUNCTION TESTS: Recent Labs    02/26/24 0703 02/26/24 0726 02/26/24 1824 02/27/24 0441  BILITOT DUP 3.8* 5.0* 6.5*  AST DUP 162* 106* 85*  ALT DUP 163* 124* 108*  ALKPHOS DUP 472* 373* 324*  PROT DUP 8.4* 7.5 7.6  ALBUMIN DUP 3.3* 2.8* 2.7*    Assessment and Plan:  Patient with history of ALS (vent dependent trach, g tube but receiving TPN) who presented with abd pain and nausea, admitted for suspected acute cholecystitis. IR placed perc chole 8/24.   RUQ chole drain present, to gravity bag, scant output (dark green/brown bilious), insertion without erythema or leakage around tube, ext suture intact.  Drain flushed easily, notable increased flow into bag immediately after. Continue TID flushes and daily output measurement.  No output available for review in chart from last night.  Pt remains tachycardic, bili continues to rise 3.8 yesterday >5.0>6.5, other LFTs improving Clinical picture overall improved.  Blood cultures pending, gall bladder asp  pending. IR will continue to monitor drain.  Notify IR prior to DC to ensure appropriate f/u in place.    Typical course of IR perc chole is as follows:  Percutaneous cholecystostomy drain to remain in place at least 6 weeks.  Recommend fluoroscopy with injection of the drain in IR to evaluate for patency of the cystic duct. If the duct is patent and general surgery feels patient is stable for cholecystectomy, the drain would be removed at time of surgery. If the duct is patent and general surgery feels patient is NEVER a candidate for cholecystectomy, drain can be capped for a trial. If symptoms recur, then place to gravity bag again. If trial is successful, discuss possible removal of the drain. If trial in unsuccessful, then patient will need routine exchanges of the  chole tube about every 8-10  weeks.  Please call the IR PA at [phone] when patient is about to be discharged and we will arrange the follow up drain injection (ok to leave message).    Electronically Signed: Laymon Coast, NP 02/27/2024, 1:32 PM   I spent a total of 15 Minutes at the the patient's bedside AND on the patient's hospital floor or unit, greater than 50% of which was counseling/coordinating care for s/p chole drain 8/24

## 2024-02-27 NOTE — Progress Notes (Addendum)
 NAME:  Jerry [name], MRN:  [id number], DOB:  10/08/1970, LOS: 1 ADMISSION DATE:  02/26/2024, CONSULTATION DATE: 02/22/2024 REFERRING MD:  Ruthe Cornet, , CHIEF COMPLAINT: Abdominal pain, nausea and vomiting  History of Present Illness:  53 year old male with advanced ALS, status post trach, vent dependent/PEG, on TPN due to not being able to tolerate tube feeds, who is being cared at home by his wife, was brought into the emergency department with complaint of abdominal pain, nausea and vomiting since yesterday.  Per patient's wife he was complaining of abdominal pain, had 3 episodes of nonbilious vomiting so she brought him here in the emergency department.  On workup he was noted to have possible acute cholecystitis, seen by general surgery recommend percutaneous cholecystostomy tube drainage.  PCCM was consulted for help evaluation and ventilator management Of note patient is spiking fever Tmax 101.7  Pertinent  Medical History   Past Medical History:  Diagnosis Date   ALS (amyotrophic lateral sclerosis) (HCC)    COVID-19 virus infection 06/2020   Meningitis 2003   spinal   Morbid obesity (HCC)    Neuromuscular disorder (HCC)    Prediabetes 11/05/2016   A1C 5.7 on 11/05/16   Type 2 diabetes mellitus (HCC) 08/31/2023    Significant Hospital Events: Including procedures, antibiotic start and stop dates in addition to other pertinent events   8/24 for cholecystostomy by IR  Interim History / Subjective:   Febrile 101.9 On chronic vent  Objective    Blood pressure 92/69, pulse (!) 126, temperature (!) 101.9 F (38.8 C), temperature source Oral, resp. rate 16, weight 77.3 kg, SpO2 99%.    FiO2 (%):  [28 %-32 %] 32 % Set Rate:  [16 bmp] 16 bmp Vt Set:  [500 mL] 500 mL PEEP:  [5 cmH20] 5 cmH20 Plateau Pressure:  [20 cmH20-22 cmH20] 20 cmH20   Intake/Output Summary (Last 24 hours) at 02/27/2024 1249 Last data filed at 02/27/2024 1200 Gross per 24 hour  Intake 2143.64 ml   Output 625 ml  Net 1518.64 ml   Filed Weights   02/26/24 1730 02/27/24 0500  Weight: 76.9 kg 77.3 kg    Examination: General: Chronically ill-appearing male, lying on the bed , communicates with eyebrows x 2=yes, x1 =no HEENT: /AT, eyes anicteric.  moist mucus membranes.  Status post trach, vent dependent Neuro: Alert, interactive with his  laptop Chest: Bilateral rhonchorous breath sounds, no wheezes Heart: Regular rate and rhythm, no murmurs or gallops Abdomen: Soft, mild tenderness right upper quadrant  Labs show hyponatremia, hypokalemia hypophosphatemia and hypomagnesemia, increasing leukocytosis   Resolved problem list   Assessment and Plan   Sepsis due to Acute cholecystitis s/p Perc cholecystostomy tube placement by IR - Due to rising WBC and fevers to remain vigilant for bacteremia from other causes such as PICC line - Will repeat blood cultures which were canceled by lab and hold for fungemia - Biliary tube with minimal drainage, surgery following - Continue TNA for now - Continue Zosyn /vancomycin    Chronic hypoxic respiratory failure status post trach, vent dependent Advanced ALS Dysphagia status post PEG, not tolerating tube feeds, currently on TPN   Continue on protective ventilation VAP prevention bundle in place Continue routine trach care He will need PEG tube change during this hospital stay once acute issues resolved    Labs   CBC: Recent Labs  Lab 02/26/24 0424 02/26/24 0737 02/26/24 1005 02/27/24 0441  WBC 8.0  --  10.4 18.8*  NEUTROABS 6.6  --  8.2*  --  HGB 8.6* 11.9* 11.9* 11.5*  HCT 31.0* 35.0* 37.0* 34.9*  MCV 99.0  --  84.3 81.5  PLT 165  --  151 207    Basic Metabolic Panel: Recent Labs  Lab 02/26/24 0424 02/26/24 0703 02/26/24 0726 02/26/24 0737 02/26/24 1824 02/27/24 0441  NA 114* DUP 134* 137 133* 134*  K >7.5* DUP 3.5 3.5 2.2* 3.2*  CL 78* DUP 105  --  103 109  CO2 13* DUP 17*  --  17* 15*  GLUCOSE >1,200* DUP  170*  --  117* 87  BUN 5* DUP 7  --  5* 8  CREATININE 0.44* DUP <0.30*  --  <0.30* 0.30*  CALCIUM  8.8* DUP 9.5  --  8.9 8.9  MG  --   --   --   --   --  1.6*  PHOS  --   --   --   --   --  2.0*   GFR: Estimated Creatinine Clearance: 103.6 mL/min (A) (by C-G formula based on SCr of 0.3 mg/dL (L)). Recent Labs  Lab 02/26/24 0424 02/26/24 0437 02/26/24 0737 02/26/24 1005 02/26/24 1829 02/27/24 0441  WBC 8.0  --   --  10.4  --  18.8*  LATICACIDVEN  --  0.6 2.3*  --  1.0  --     Liver Function Tests: Recent Labs  Lab 02/26/24 0424 02/26/24 0703 02/26/24 0726 02/26/24 1824 02/27/24 0441  AST 120* DUP 162* 106* 85*  ALT 117* DUP 163* 124* 108*  ALKPHOS 313* DUP 472* 373* 324*  BILITOT 2.6* DUP 3.8* 5.0* 6.5*  PROT 6.0* DUP 8.4* 7.5 7.6  ALBUMIN 2.3* DUP 3.3* 2.8* 2.7*   Recent Labs  Lab 02/26/24 0424  LIPASE 20   No results for input(s): AMMONIA in the last 168 hours.  ABG    Component Value Date/Time   PHART 7.52 (H) 01/09/2022 1810   PCO2ART 26 (L) 01/09/2022 1810   PO2ART 66 (L) 01/09/2022 1810   HCO3 19.2 (L) 02/26/2024 0737   TCO2 20 (L) 02/26/2024 0737   ACIDBASEDEF 3.0 (H) 02/26/2024 0737   O2SAT 94 02/26/2024 0737     Coagulation Profile: Recent Labs  Lab 02/27/24 0441  INR 1.4*    Cardiac Enzymes: No results for input(s): CKTOTAL, CKMB, CKMBINDEX, TROPONINI in the last 168 hours.  HbA1C: Hgb A1c MFr Bld  Date/Time Value Ref Range Status  02/26/2024 06:28 PM 5.2 4.8 - 5.6 % Final    Comment:    (NOTE) Diagnosis of Diabetes The following HbA1c ranges recommended by the American Diabetes Association (ADA) may be used as an aid in the diagnosis of diabetes mellitus.  Hemoglobin             Suggested A1C NGSP%              Diagnosis  <5.7                   Non Diabetic  5.7-6.4                Pre-Diabetic  >6.4                   Diabetic  <7.0                   Glycemic control for                       adults with  diabetes.    12/09/2021  11:05 AM 5.4 4.8 - 5.6 % Final    Comment:    (NOTE) Pre diabetes:          5.7%-6.4%  Diabetes:              >6.4%  Glycemic control for   <7.0% adults with diabetes     CBG: Recent Labs  Lab 02/26/24 2016 02/26/24 2315 02/27/24 0455 02/27/24 0735 02/27/24 1119  GLUCAP 101* 92 87 82 106*   My independent critical care time was 32 minutes   Patrizia Paule V. Jude MD [facility] See Amion for pager If no response to pager, please call [phone] until 7pm After 7pm, Please call E-link [phone]

## 2024-02-27 NOTE — Progress Notes (Signed)
 PHARMACY - TOTAL PARENTERAL NUTRITION CONSULT NOTE   Indication: Chronic ileus  Patient Measurements: Weight: 77.3 kg (170 lb 6.7 oz)   Body mass index is 28.36 kg/m. Usual Weight: 77.3 kg   Assessment: Patient is a 53 yo patient with ALS, bed bound at baseline, and chronic ileus who has received TPN for 2 years. He presented with nausea and vomiting, found to have cholecystitis, s/p cholecyststomy tube insertion 8/24.   Patient is with increased WBC count and persistent fever, on antibiotics. Concern for possible central line infection due to chronic TPN. Blood cultures pending.   Glucose / Insulin : A1c 5.2, CBGs <180, no SSI  Electrolytes: Na 134, K 3.2, Cl 109, Co2 15, phos 2, mg 1.6  Renal: Scr 0.30 Hepatic: Tbili 6.5, AST/ALT 85/108 Intake / Output; MIVF: UOP 0.62ml/kg/min, +1.5L since admit, LR @40mL /hr  GI Imaging: 8/24: Chronic Gallbladder Hydrops, but new simple density free fluid in the abdomen, indistinct appearance of the gallbladder wall, with layering sludge or stones. Constellation highly suspicious for Acute Cholecystitis GI Surgeries / Procedures:  8/24 cholecyststomy tube insertion  Central access: [place], [place]  TPN start date: Year 2023   RD Assessment: Estimated Needs Total Energy Estimated Needs: 1700-1900 Total Protein Estimated Needs: 90-105 grams Total Fluid Estimated Needs: 1.7-1.9 L  TPN Formula Prior to Admission:   Mon/Fri:  Infusion Duration: 18h  Total Kcal: 1331 Protein: 80g Dextrose : 180g  Lipids: 40g Sodium: 76 mEq Magnesium : 20 mEq Potassium: 180 mEq Calcium : 7.4 mEq Phosphate: 37 mmol Acetate: 280.61 mEq Cl: 0 mEq Trace elements: 1mL  Tues/Wed/Thurs/Sat/Sun:  Infusion Duration: 18h  Total Kcal: 931  Protein: 80g Dextrose : 180g  Lipids: 0g Sodium: 76 mEq Magnesium : 20 mEq Potassium: 180 mEq Calcium : 7.4 mEq Phosphate: 37 mmol Acetate: 280.61 mEq Cl: 0 mEq Trace elements: 1mL  Current Nutrition:  NPO  Plan:  Will hold  TPN until CLABSI rule out per surgery Will discuss tube feed trial with CCM per patient's wife and surgery  KCL 40 meq IV + Kphos 15 mmol IV once + magnesium  sulfate 4g IV per CCM   Lorne Winkels M Maudy Yonan 02/27/2024,12:41 PM

## 2024-02-27 NOTE — Progress Notes (Signed)
 S: IR drain placed yesterday O: BP 96/70   Pulse (!) 117   Temp (!) 100.5 F (38.1 C) (Oral)   Resp 16   Wt 77.3 kg   SpO2 98%   BMI 28.36 kg/m  Gen: somnolent Ab: drain in place with bilious output, nontender  Tb 6.5 from 5.0 yesterday, uptrending Other LFTs downtrending  A/P 53 yo male with ALS and chronic ileus with cholecystitis, s/p cholecyststomy tube insertion 8/24 -continue antibiotics -continue TPN

## 2024-02-27 NOTE — Plan of Care (Signed)
  Problem: Fluid Volume: Goal: Hemodynamic stability will improve Outcome: Progressing   Problem: Clinical Measurements: Goal: Diagnostic test results will improve Outcome: Progressing   Problem: Respiratory: Goal: Ability to maintain adequate ventilation will improve Outcome: Progressing

## 2024-02-27 NOTE — Progress Notes (Signed)
 Initial Nutrition Assessment  DOCUMENTATION CODES:   Not applicable  INTERVENTION:   - TPN management per Pharmacy  NUTRITION DIAGNOSIS:   Inadequate oral intake related to inability to eat, altered GI function as evidenced by NPO status.  GOAL:   Patient will meet greater than or equal to 90% of their needs  MONITOR:   Labs, Weight trends, I & O's  REASON FOR ASSESSMENT:   Consult Assessment of nutrition requirement/status, New TPN/TNA, Enteral/tube feeding initiation and management  ASSESSMENT:   53 year old Jerry [name] who presented to the ED on 02/26/24 with abdominal pain, distention, and emesis. PMH of advanced ALS, chronic hypoxic respiratory failure s/p trach, vent dependence, G-tube in place, TPN dependence. Pt admitted with sepsis due to acute cholecystitis.  8/24 - s/p cholecystostomy catheter placement by IR  RD received consult for assessment, enteral/tube feeding initiation and management, and new TNA/TPN. No plans to use G-tube at this time for feeds.  Unable to obtain history at this time. Pt sleeping and no family present in room at time of RD visit. Pt is on home TPN due to chronic ileus. Unsure of pt's home TPN regimen.  Reviewed weight history in chart. Noted pt has gained 11.3 kg from 09/16/23 to 02/27/24. Prior to this, pt's weight had been very stable between 65-66 kg.  Discussed pt with TPN Pharmacist who is trying to get in touch with both Surgery and Critical Care to discuss plan for TPN initiation vs line holiday.  Patient is on chronic ventilator support via trach. MV: 8.0 L/min Temp (24hrs), Avg:100.9 F (38.3 C), Min:99.7 F (37.6 C), Max:101.7 F (38.7 C)  Drips: LR: 40 mL/hr  Medications reviewed and include: pepcid  20 mg BID, SSI every 4 hours IV protonix  40 mg every 12 hours, IV magnesium  sulfate 4 grams x 1, IV zosyn , IV KCl 10 mEq x 6, IV potassium phosphate  15 mmol x 1, IV vancomycin   Labs reviewed: sodium 134, potassium 3.2, phosphorus  2.0, magnesium  1.6, elevated LFTs, WBC 18.8 CBG's: 82-115 x 24 hours  UOP: 625 mL x 24 hours I/O's: +1.4 L since admit  NUTRITION - FOCUSED PHYSICAL EXAM:  Flowsheet Row Most Recent Value  Orbital Region No depletion  Upper Arm Region No depletion  Thoracic and Lumbar Region No depletion  Buccal Region No depletion  Temple Region Mild depletion  Clavicle Bone Region No depletion  Clavicle and Acromion Bone Region Mild depletion  Scapular Bone Region Unable to assess  Dorsal Hand No depletion  Patellar Region Unable to assess  Anterior Thigh Region Unable to assess  Posterior Calf Region Unable to assess  Edema (RD Assessment) None  Hair Reviewed  Eyes Unable to assess  Mouth Unable to assess  Skin Reviewed  Nails Reviewed    Diet Order:   Diet Order             Diet NPO time specified  Diet effective now                   EDUCATION NEEDS:   Not appropriate for education at this time  Skin:  Skin Assessment: Reviewed RN Assessment  Last BM:  no documented BM  Height:   Ht Readings from Last 1 Encounters:  09/16/23 5' 5 (1.651 m)    Weight:   Wt Readings from Last 1 Encounters:  02/27/24 77.3 kg    BMI:  Body mass index is 28.36 kg/m.  Estimated Nutritional Needs:   Kcal:  1700-1900  Protein:  90-105  grams  Fluid:  1.7-1.9 L    Mallie Satchel, MS, RD, LDN Registered Dietitian II Please see AMiON for contact information.

## 2024-02-27 NOTE — Progress Notes (Signed)
 [facility] ADULT ICU REPLACEMENT PROTOCOL   The patient does apply for the [facility] Adult ICU Electrolyte Replacment Protocol based on the criteria listed below:   1.Exclusion criteria: TCTS, ECMO, Dialysis, and Myasthenia Gravis patients 2. Is GFR >/= 30 ml/min? Yes.    Patient's GFR today is > 60 3. Is SCr </= 2? Yes.   Patient's SCr is 0.30 mg/dL 4. Did SCr increase >/= 0.5 in 24 hours? No. 5.Pt's weight >40kg  Yes.   6. Abnormal electrolyte(s): K+ 3.2, Mg 1.6 and Phos 2.0  7. Electrolytes replaced per protocol 8.  Call MD STAT for K+ </= 2.5, Phos </= 1, or Mag </= 1 Physician:  Dr Haze Darner, Norleen Barters 02/27/2024 6:24 AM

## 2024-02-27 NOTE — TOC CM/SW Note (Signed)
 Transition of Care [facility] - Inpatient Brief Assessment   Patient Details  Name: Durk Carmen MRN: [id number] Date of Birth: Feb 22, 1971  Transition of Care [facility]) CM/SW Contact:    Lauraine FORBES Saa, LCSWA Phone Number: 02/27/2024, 9:43 AM   Clinical Narrative:  9:43 AM Per chart review, patient resides at home with spouse. Patient has a PCP and insurance. Patient patient is active with [facility] outpatient-based palliative care and Home Based Primary Care.  Patient does not have SNF history. Patient has HH history with [facility], [facility], [facility], and [facility]. Patient has DME (hospital bed, wheelchair, hoyer lift, stretcher transport, nebulizer machine, trach supplies, oxygen (trilogy)) history with [facility]. Patient's preferred pharmacy is [facility] [phone] [facility]. No TOC needs were identified at this time. TOC will continue to follow and be available to assist.  Transition of Care Asessment: Insurance and Status: Insurance coverage has been reviewed Patient has primary care physician: Yes Home environment has been reviewed: Private Residence Prior level of function:: N/A Prior/Current Home Services: No current home services Social Drivers of Health Review: SDOH reviewed no interventions necessary Readmission risk has been reviewed: Yes (Currently Green 13%) Transition of care needs: no transition of care needs at this time

## 2024-02-27 NOTE — Plan of Care (Signed)
  Problem: Fluid Volume: Goal: Hemodynamic stability will improve Outcome: Not Progressing   Problem: Clinical Measurements: Goal: Diagnostic test results will improve Outcome: Not Progressing Goal: Signs and symptoms of infection will decrease Outcome: Not Progressing   Problem: Respiratory: Goal: Ability to maintain adequate ventilation will improve Outcome: Not Progressing   Problem: Education: Goal: Ability to describe self-care measures that may prevent or decrease complications (Diabetes Survival Skills Education) will improve Outcome: Not Progressing Goal: Individualized Educational Video(s) Outcome: Not Progressing   Problem: Coping: Goal: Ability to adjust to condition or change in health will improve Outcome: Not Progressing   Problem: Fluid Volume: Goal: Ability to maintain a balanced intake and output will improve Outcome: Not Progressing   Problem: Health Behavior/Discharge Planning: Goal: Ability to identify and utilize available resources and services will improve Outcome: Not Progressing Goal: Ability to manage health-related needs will improve Outcome: Not Progressing   Problem: Metabolic: Goal: Ability to maintain appropriate glucose levels will improve Outcome: Not Progressing   Problem: Nutritional: Goal: Maintenance of adequate nutrition will improve Outcome: Not Progressing Goal: Progress toward achieving an optimal weight will improve Outcome: Not Progressing   Problem: Skin Integrity: Goal: Risk for impaired skin integrity will decrease Outcome: Not Progressing   Problem: Tissue Perfusion: Goal: Adequacy of tissue perfusion will improve Outcome: Not Progressing   Problem: Education: Goal: Knowledge of General Education information will improve Description: Including pain rating scale, medication(s)/side effects and non-pharmacologic comfort measures Outcome: Not Progressing   Problem: Health Behavior/Discharge Planning: Goal: Ability  to manage health-related needs will improve Outcome: Not Progressing   Problem: Clinical Measurements: Goal: Ability to maintain clinical measurements within normal limits will improve Outcome: Not Progressing Goal: Will remain free from infection Outcome: Not Progressing Goal: Diagnostic test results will improve Outcome: Not Progressing Goal: Respiratory complications will improve Outcome: Not Progressing Goal: Cardiovascular complication will be avoided Outcome: Not Progressing   Problem: Activity: Goal: Risk for activity intolerance will decrease Outcome: Not Progressing   Problem: Nutrition: Goal: Adequate nutrition will be maintained Outcome: Not Progressing   Problem: Coping: Goal: Level of anxiety will decrease Outcome: Not Progressing   Problem: Elimination: Goal: Will not experience complications related to bowel motility Outcome: Not Progressing Goal: Will not experience complications related to urinary retention Outcome: Not Progressing   Problem: Pain Managment: Goal: General experience of comfort will improve and/or be controlled Outcome: Not Progressing   Problem: Safety: Goal: Ability to remain free from injury will improve Outcome: Not Progressing   Problem: Skin Integrity: Goal: Risk for impaired skin integrity will decrease Outcome: Not Progressing

## 2024-02-28 DIAGNOSIS — E43 Unspecified severe protein-calorie malnutrition: Secondary | ICD-10-CM

## 2024-02-28 DIAGNOSIS — A419 Sepsis, unspecified organism: Secondary | ICD-10-CM | POA: Diagnosis not present

## 2024-02-28 DIAGNOSIS — J9610 Chronic respiratory failure, unspecified whether with hypoxia or hypercapnia: Secondary | ICD-10-CM | POA: Diagnosis not present

## 2024-02-28 DIAGNOSIS — R6520 Severe sepsis without septic shock: Secondary | ICD-10-CM | POA: Diagnosis not present

## 2024-02-28 DIAGNOSIS — Z930 Tracheostomy status: Secondary | ICD-10-CM | POA: Diagnosis not present

## 2024-02-28 LAB — GLUCOSE, CAPILLARY
Glucose-Capillary: 101 mg/dL — ABNORMAL HIGH (ref 70–99)
Glucose-Capillary: 118 mg/dL — ABNORMAL HIGH (ref 70–99)
Glucose-Capillary: 120 mg/dL — ABNORMAL HIGH (ref 70–99)
Glucose-Capillary: 127 mg/dL — ABNORMAL HIGH (ref 70–99)
Glucose-Capillary: 155 mg/dL — ABNORMAL HIGH (ref 70–99)
Glucose-Capillary: 167 mg/dL — ABNORMAL HIGH (ref 70–99)

## 2024-02-28 LAB — CBC WITH DIFFERENTIAL/PLATELET
Abs Immature Granulocytes: 0.28 K/uL — ABNORMAL HIGH (ref 0.00–0.07)
Basophils Absolute: 0 K/uL (ref 0.0–0.1)
Basophils Relative: 0 %
Eosinophils Absolute: 0 K/uL (ref 0.0–0.5)
Eosinophils Relative: 0 %
HCT: 32.7 % — ABNORMAL LOW (ref 39.0–52.0)
Hemoglobin: 10.7 g/dL — ABNORMAL LOW (ref 13.0–17.0)
Immature Granulocytes: 1 %
Lymphocytes Relative: 7 %
Lymphs Abs: 1.5 K/uL (ref 0.7–4.0)
MCH: 27.2 pg (ref 26.0–34.0)
MCHC: 32.7 g/dL (ref 30.0–36.0)
MCV: 83.2 fL (ref 80.0–100.0)
Monocytes Absolute: 0.9 K/uL (ref 0.1–1.0)
Monocytes Relative: 4 %
Neutro Abs: 19.5 K/uL — ABNORMAL HIGH (ref 1.7–7.7)
Neutrophils Relative %: 88 %
Platelets: 239 K/uL (ref 150–400)
RBC: 3.93 MIL/uL — ABNORMAL LOW (ref 4.22–5.81)
RDW: 17.2 % — ABNORMAL HIGH (ref 11.5–15.5)
WBC: 22.3 K/uL — ABNORMAL HIGH (ref 4.0–10.5)
nRBC: 0 % (ref 0.0–0.2)

## 2024-02-28 LAB — BASIC METABOLIC PANEL WITH GFR
Anion gap: 10 (ref 5–15)
BUN: 13 mg/dL (ref 6–20)
CO2: 16 mmol/L — ABNORMAL LOW (ref 22–32)
Calcium: 8.8 mg/dL — ABNORMAL LOW (ref 8.9–10.3)
Chloride: 108 mmol/L (ref 98–111)
Creatinine, Ser: <0.3 mg/dL — ABNORMAL LOW (ref 0.61–1.24)
Glucose, Bld: 107 mg/dL — ABNORMAL HIGH (ref 70–99)
Potassium: 3 mmol/L — ABNORMAL LOW (ref 3.5–5.1)
Sodium: 134 mmol/L — ABNORMAL LOW (ref 135–145)

## 2024-02-28 LAB — PHOSPHORUS: Phosphorus: 1.8 mg/dL — ABNORMAL LOW (ref 2.5–4.6)

## 2024-02-28 LAB — MAGNESIUM: Magnesium: 2.4 mg/dL (ref 1.7–2.4)

## 2024-02-28 MED ORDER — SCOPOLAMINE 1 MG/3DAYS TD PT72
1.0000 | MEDICATED_PATCH | TRANSDERMAL | Status: DC
  Administered 2024-02-28 – 2024-03-11 (×5): 1 mg via TRANSDERMAL
  Filled 2024-02-28 (×5): qty 1

## 2024-02-28 MED ORDER — POTASSIUM CHLORIDE 20 MEQ PO PACK
40.0000 meq | PACK | Freq: Once | ORAL | Status: AC
  Administered 2024-02-28: 40 meq
  Filled 2024-02-28: qty 2

## 2024-02-28 MED ORDER — OLANZAPINE 10 MG IM SOLR
5.0000 mg | Freq: Once | INTRAMUSCULAR | Status: AC
  Administered 2024-02-28: 5 mg via INTRAVENOUS
  Filled 2024-02-28: qty 10

## 2024-02-28 MED ORDER — INSULIN ASPART 100 UNIT/ML IJ SOLN: 0.0000 [IU] | INTRAMUSCULAR | Status: DC

## 2024-02-28 MED ORDER — VITAL HP 1.0 CAL PO LIQD
1000.0000 mL | ORAL | Status: DC
  Administered 2024-02-28 – 2024-02-29 (×2): 1000 mL

## 2024-02-28 MED ORDER — POTASSIUM PHOSPHATES 15 MMOLE/5ML IV SOLN
30.0000 mmol | Freq: Once | INTRAVENOUS | Status: AC
  Administered 2024-02-28: 30 mmol via INTRAVENOUS
  Filled 2024-02-28: qty 10

## 2024-02-28 MED ORDER — INSULIN ASPART 100 UNIT/ML IJ SOLN
0.0000 [IU] | INTRAMUSCULAR | Status: DC
  Administered 2024-02-28 – 2024-02-29 (×5): 2 [IU] via SUBCUTANEOUS

## 2024-02-28 MED ORDER — POTASSIUM CHLORIDE 10 MEQ/50ML IV SOLN: 10.0000 meq | INTRAVENOUS | Status: DC

## 2024-02-28 MED ORDER — GLYCOPYRROLATE 1 MG PO TABS
1.0000 mg | ORAL_TABLET | Freq: Two times a day (BID) | ORAL | Status: DC
  Filled 2024-02-28: qty 1

## 2024-02-28 MED ORDER — GLYCOPYRROLATE 0.2 MG/ML IJ SOLN
0.2000 mg | Freq: Two times a day (BID) | INTRAMUSCULAR | Status: DC
  Administered 2024-02-28 – 2024-03-12 (×28): 0.2 mg via INTRAVENOUS
  Filled 2024-02-28 (×28): qty 1

## 2024-02-28 MED ORDER — STERILE WATER FOR INJECTION IJ SOLN
INTRAMUSCULAR | Status: AC
  Administered 2024-02-28: 10 mL
  Filled 2024-02-28: qty 10

## 2024-02-28 MED ORDER — ALBUTEROL SULFATE (2.5 MG/3ML) 0.083% IN NEBU
2.5000 mg | INHALATION_SOLUTION | RESPIRATORY_TRACT | Status: DC | PRN
  Administered 2024-02-28 – 2024-03-12 (×11): 2.5 mg via RESPIRATORY_TRACT
  Filled 2024-02-28 (×12): qty 3

## 2024-02-28 MED ORDER — POTASSIUM CHLORIDE 10 MEQ/50ML IV SOLN
10.0000 meq | INTRAVENOUS | Status: AC
  Administered 2024-02-28 (×2): 10 meq via INTRAVENOUS
  Filled 2024-02-28 (×2): qty 50

## 2024-02-28 MED ORDER — ZINC CHLORIDE 1 MG/ML IV SOLN
INTRAVENOUS | Status: AC
  Filled 2024-02-28: qty 1008

## 2024-02-28 MED ORDER — CHLORHEXIDINE GLUCONATE CLOTH 2 % EX PADS
6.0000 | MEDICATED_PAD | Freq: Every evening | CUTANEOUS | Status: DC
  Administered 2024-02-28 – 2024-03-12 (×12): 6 via TOPICAL

## 2024-02-28 NOTE — Plan of Care (Signed)
  Problem: Fluid Volume: Goal: Hemodynamic stability will improve Outcome: Not Progressing   Problem: Clinical Measurements: Goal: Diagnostic test results will improve Outcome: Not Progressing Goal: Signs and symptoms of infection will decrease Outcome: Not Progressing   Problem: Respiratory: Goal: Ability to maintain adequate ventilation will improve Outcome: Not Progressing   Problem: Education: Goal: Ability to describe self-care measures that may prevent or decrease complications (Diabetes Survival Skills Education) will improve Outcome: Not Progressing Goal: Individualized Educational Video(s) Outcome: Not Progressing   Problem: Coping: Goal: Ability to adjust to condition or change in health will improve Outcome: Not Progressing   Problem: Fluid Volume: Goal: Ability to maintain a balanced intake and output will improve Outcome: Not Progressing   Problem: Health Behavior/Discharge Planning: Goal: Ability to identify and utilize available resources and services will improve Outcome: Not Progressing Goal: Ability to manage health-related needs will improve Outcome: Not Progressing   Problem: Metabolic: Goal: Ability to maintain appropriate glucose levels will improve Outcome: Not Progressing   Problem: Nutritional: Goal: Maintenance of adequate nutrition will improve Outcome: Not Progressing Goal: Progress toward achieving an optimal weight will improve Outcome: Not Progressing   Problem: Skin Integrity: Goal: Risk for impaired skin integrity will decrease Outcome: Not Progressing   Problem: Tissue Perfusion: Goal: Adequacy of tissue perfusion will improve Outcome: Not Progressing   Problem: Education: Goal: Knowledge of General Education information will improve Description: Including pain rating scale, medication(s)/side effects and non-pharmacologic comfort measures Outcome: Not Progressing   Problem: Health Behavior/Discharge Planning: Goal: Ability  to manage health-related needs will improve Outcome: Not Progressing   Problem: Clinical Measurements: Goal: Ability to maintain clinical measurements within normal limits will improve Outcome: Not Progressing Goal: Will remain free from infection Outcome: Not Progressing Goal: Diagnostic test results will improve Outcome: Not Progressing Goal: Respiratory complications will improve Outcome: Not Progressing Goal: Cardiovascular complication will be avoided Outcome: Not Progressing   Problem: Activity: Goal: Risk for activity intolerance will decrease Outcome: Not Progressing   Problem: Nutrition: Goal: Adequate nutrition will be maintained Outcome: Not Progressing   Problem: Coping: Goal: Level of anxiety will decrease Outcome: Not Progressing   Problem: Elimination: Goal: Will not experience complications related to bowel motility Outcome: Not Progressing Goal: Will not experience complications related to urinary retention Outcome: Not Progressing   Problem: Pain Managment: Goal: General experience of comfort will improve and/or be controlled Outcome: Not Progressing   Problem: Safety: Goal: Ability to remain free from injury will improve Outcome: Not Progressing   Problem: Skin Integrity: Goal: Risk for impaired skin integrity will decrease Outcome: Not Progressing

## 2024-02-28 NOTE — Progress Notes (Signed)
 NAME:  Mandy Fitzwater, MRN:  [id number], DOB:  01/13/1971, LOS: 2 ADMISSION DATE:  02/26/2024, CONSULTATION DATE: 02/22/2024 REFERRING MD:  Ruthe Cornet, , CHIEF COMPLAINT: Abdominal pain, nausea and vomiting  History of Present Illness:  53 year old male with advanced ALS, status post trach, vent dependent/PEG, on TPN due to not being able to tolerate tube feeds, who is being cared at home by his wife, was brought into the emergency department with complaint of abdominal pain, nausea and vomiting since yesterday.  Per patient's wife he was complaining of abdominal pain, had 3 episodes of nonbilious vomiting so she brought him here in the emergency department.  On workup he was noted to have possible acute cholecystitis, seen by general surgery recommend percutaneous cholecystostomy tube drainage.  PCCM was consulted for help evaluation and ventilator management Of note patient is spiking fever Tmax 101.7  Pertinent  Medical History   Past Medical History:  Diagnosis Date   ALS (amyotrophic lateral sclerosis) (HCC)    COVID-19 virus infection 06/2020   Meningitis 2003   spinal   Morbid obesity (HCC)    Neuromuscular disorder (HCC)    Prediabetes 11/05/2016   A1C 5.7 on 11/05/16   Type 2 diabetes mellitus (HCC) 08/31/2023    Significant Hospital Events: Including procedures, antibiotic start and stop dates in addition to other pertinent events   8/24 perc cholecystostomy by IR  Interim History / Subjective:   Febrile 100.5 Tmax Tachycardic Denies chest pain or dyspnea On home vent  Objective    Blood pressure (!) 84/60, pulse (!) 101, temperature 98.7 F (37.1 C), temperature source Oral, resp. rate 16, weight 82.5 kg, SpO2 100%.    Vent Mode: AC FiO2 (%):  [32 %] 32 % Set Rate:  [16 bmp] 16 bmp Vt Set:  [500 mL] 500 mL PEEP:  [5 cmH20] 5 cmH20 Plateau Pressure:  [14 cmH20-18 cmH20] 18 cmH20   Intake/Output Summary (Last 24 hours) at 02/28/2024 1157 Last data filed at  02/28/2024 1100 Gross per 24 hour  Intake 2516.05 ml  Output 1075 ml  Net 1441.05 ml   Filed Weights   02/26/24 1730 02/27/24 0500 02/28/24 0310  Weight: 76.9 kg 77.3 kg 82.5 kg    Examination: General: Chronically ill-appearing male, lying on the bed , communicates with eyebrows x 2=yes, x1 =no HEENT: [employer]/AT, eyes anicteric.  moist mucus membranes.  Status post trach, vent dependent Neuro: Alert, interactive with his  laptop Chest: Bilateral clear breath sounds, no wheezes Heart: Regular rate and rhythm, no murmurs or gallops Abdomen: Soft, decreased tenderness, no guarding  Labs show hyponatremia, hypokalemia hypophosphatemia, increasing leukocytosis to 22K   Resolved problem list   Assessment and Plan   Sepsis due to Acute cholecystitis s/p Perc cholecystostomy tube placement by IR - Due to rising WBC and fevers ,remain vigilant for bacteremia from other causes such as PICC line -Will DC PICC line - Will repeat blood cultures which were canceled by lab and hold for fungemia - Biliary tube with minimal drainage, surgery following - Continue Zosyn /vancomycin    Chronic hypoxic respiratory failure status post trach, vent dependent Advanced ALS Dysphagia status post PEG, not tolerating tube feeds, currently on TPN   Continue on home vent VAP prevention bundle in place Continue routine trach care   Severe protein calorie malnutrition - For some reason he has been maintained on chronic TNA since episode of ileus in 2023 - Resume trickle tube feeds and see if he will tolerate during hospital stay -  Continue TNA for now He will need PEG tube change during this hospital stay once acute issues resolved  Updated patient and wife who agree with care plan  Labs   CBC: Recent Labs  Lab 02/26/24 0424 02/26/24 0737 02/26/24 1005 02/27/24 0441 02/28/24 0307  WBC 8.0  --  10.4 18.8* 22.3*  NEUTROABS 6.6  --  8.2*  --  19.5*  HGB 8.6* 11.9* 11.9* 11.5* 10.7*  HCT 31.0*  35.0* 37.0* 34.9* 32.7*  MCV 99.0  --  84.3 81.5 83.2  PLT 165  --  151 207 239    Basic Metabolic Panel: Recent Labs  Lab 02/26/24 0703 02/26/24 0726 02/26/24 0737 02/26/24 1824 02/27/24 0441 02/28/24 0307  NA DUP 134* 137 133* 134* 134*  K DUP 3.5 3.5 2.2* 3.2* 3.0*  CL DUP 105  --  103 109 108  CO2 DUP 17*  --  17* 15* 16*  GLUCOSE DUP 170*  --  117* 87 107*  BUN DUP 7  --  5* 8 13  CREATININE DUP <0.30*  --  <0.30* 0.30* <0.30*  CALCIUM  DUP 9.5  --  8.9 8.9 8.8*  MG  --   --   --   --  1.6* 2.4  PHOS  --   --   --   --  2.0* 1.8*   GFR: CrCl cannot be calculated (This lab value cannot be used to calculate CrCl because it is not a number: <0.30). Recent Labs  Lab 02/26/24 0424 02/26/24 0437 02/26/24 0737 02/26/24 1005 02/26/24 1829 02/27/24 0441 02/28/24 0307  WBC 8.0  --   --  10.4  --  18.8* 22.3*  LATICACIDVEN  --  0.6 2.3*  --  1.0  --   --     Liver Function Tests: Recent Labs  Lab 02/26/24 0424 02/26/24 0703 02/26/24 0726 02/26/24 1824 02/27/24 0441  AST 120* DUP 162* 106* 85*  ALT 117* DUP 163* 124* 108*  ALKPHOS 313* DUP 472* 373* 324*  BILITOT 2.6* DUP 3.8* 5.0* 6.5*  PROT 6.0* DUP 8.4* 7.5 7.6  ALBUMIN 2.3* DUP 3.3* 2.8* 2.7*   Recent Labs  Lab 02/26/24 0424  LIPASE 20   No results for input(s): AMMONIA in the last 168 hours.  ABG    Component Value Date/Time   PHART 7.52 (H) 01/09/2022 1810   PCO2ART 26 (L) 01/09/2022 1810   PO2ART 66 (L) 01/09/2022 1810   HCO3 19.2 (L) 02/26/2024 0737   TCO2 20 (L) 02/26/2024 0737   ACIDBASEDEF 3.0 (H) 02/26/2024 0737   O2SAT 94 02/26/2024 0737     Coagulation Profile: Recent Labs  Lab 02/27/24 0441  INR 1.4*    Cardiac Enzymes: No results for input(s): CKTOTAL, CKMB, CKMBINDEX, TROPONINI in the last 168 hours.  HbA1C: Hgb A1c MFr Bld  Date/Time Value Ref Range Status  02/26/2024 06:28 PM 5.2 4.8 - 5.6 % Final    Comment:    (NOTE) Diagnosis of Diabetes The following  HbA1c ranges recommended by the American Diabetes Association (ADA) may be used as an aid in the diagnosis of diabetes mellitus.  Hemoglobin             Suggested A1C NGSP%              Diagnosis  <5.7                   Non Diabetic  5.7-6.4  Pre-Diabetic  >6.4                   Diabetic  <7.0                   Glycemic control for                       adults with diabetes.    12/09/2021 11:05 AM 5.4 4.8 - 5.6 % Final    Comment:    (NOTE) Pre diabetes:          5.7%-6.4%  Diabetes:              >6.4%  Glycemic control for   <7.0% adults with diabetes     CBG: Recent Labs  Lab 02/27/24 1940 02/27/24 2312 02/28/24 0417 02/28/24 0738 02/28/24 1138  GLUCAP 111* 108* 120* 101* 127*   My independent critical care time was 32 minutes   Mylena Sedberry V. Jude MD [place] [facility] See Amion for pager If no response to pager, please call [phone] until 7pm After 7pm, Please call E-link [phone]

## 2024-02-28 NOTE — Progress Notes (Signed)
 eLink Physician-Brief Progress Note Patient Name: Jerry [name] DOB: 1971-03-03 MRN: [id number]   Date of Service  02/28/2024  HPI/Events of Note  a lot of oral secretions. Says it feels like he is choking on them  eICU Interventions  Renew home scopolamine     0214 -ongoing agitation and restlessness, difficulty sleeping, wife also requesting home albuterol .  Add olanzapine  x 1, renew albuterol   Intervention Category Minor Interventions: Routine modifications to care plan (e.g. PRN medications for pain, fever)  Rodman Recupero 02/28/2024, 12:49 AM

## 2024-02-28 NOTE — Progress Notes (Signed)
 Noted rising WBC and rising Tbili yesterday. LFTs not checked today, recommend checking. IR cholecystostomy tube with 20cc/24h. Recommend IR eval for functioning tube.   Jerry GEANNIE Hanger, MD General and Trauma Surgery [facility] Surgery

## 2024-02-28 NOTE — Progress Notes (Signed)
 Brief Nutrition Support Note  TPN to be restarted this PM using port for access.   New consult received for the initiation of trickle tube feeds. Pt on chronic TPN due to hx of recurrent ileus. Current imaging does not show ileus. Pt and wife would like to trial feeds as g-tube remains in place. Pt discussed during ICU rounds. MD starting feeds at trickle of 10. If tolerated, will advance in the AM and then will continue to increase by 10 q12 hours as but has not been utilized in quite some time.   Vital High Protein currently infusing. Will change formula 8/26 and continue to advance if pt tolerates overnight.   Recommend the following if able to begin advancing: Adjust tube feeding via g-tube: Osmolite 1.2 at 60 ml/h (1440 ml per day) Start at 20 and advance by 10mL every 12 hours to reach goal Prosource TF20 60 ml 1x/d Provides 1808 kcal, 100 gm protein, 1181  ml free water  daily   Vernell Lukes, RD, LDN, CNSC Registered Dietitian II Please reach out via secure chat

## 2024-02-28 NOTE — Plan of Care (Signed)
  Problem: Fluid Volume: Goal: Hemodynamic stability will improve Outcome: Progressing   Problem: Clinical Measurements: Goal: Diagnostic test results will improve Outcome: Progressing   Problem: Clinical Measurements: Goal: Signs and symptoms of infection will decrease Outcome: Progressing

## 2024-02-28 NOTE — Progress Notes (Signed)
 PHARMACY - TOTAL PARENTERAL NUTRITION CONSULT NOTE   Indication: Chronic ileus  Patient Measurements: Weight: 82.5 kg (181 lb 14.1 oz)   Body mass index is 30.27 kg/[name]. Usual Weight: 77.3 kg   Assessment: Patient is a 53 yo patient with ALS, bed bound at baseline, and chronic ileus who has received TPN for 2 years. He presented with nausea and vomiting, found to have cholecystitis, s/p cholecyststomy tube insertion 8/24.  Patient is with increased WBC count and persistent fever, on antibiotics. Concern for possible central line infection due to chronic TPN. Blood cultures pending. Per discussion with CCM, risk versus benefit to start TPN since patient has chronic port that will not be removed. Authorized per Dr. Alva to utilize the port for TPN.  Has been on TPN since 2023 due to recurring prolonged ileus. Patient is having bowel movements and no ileus suspected on imaging. Per surgery, patient, and wife, would like to trial tube feeds while inpatient.   Glucose / Insulin : A1c 5.2, CBGs <180, no SSI  Electrolytes: Na 134, K 3.0 (decreased 0.2 after 60 meq) , Cl 108, Co2 16, phos 1.8 (decr 0.2 after 15 mmol), mg 2.4 (decr 0.3 after 4g IV) Renal: Scr <0.30 Hepatic: Tbili 6.5, AST/ALT 85/108 Intake / Output; MIVF: UOP 0.73ml/kg/min, +2.8L since admit  GI Imaging: 8/24: CT abd suspicious for Acute Cholecystitis GI Surgeries / Procedures:  8/24 cholecyststomy tube insertion  Central access: Port (PICC removed for CLABSI concern 02/28/24) TPN start date: Year 2023   RD Assessment: Estimated Needs Total Energy Estimated Needs: 1700-1900 Total Protein Estimated Needs: 90-105 grams Total Fluid Estimated Needs: 1.7-1.9 L  TPN Formula Prior to Admission:  Infusion Duration: 18h  Total Kcal: 1331 on lipids days; 931 on non lipid days  Protein: 80g Dextrose : 180g  Lipids: 40g (no lipids Tues/Wed/Thurs/Sat/Sun) Magnesium : 20 mEq Potassium: 180 mEq Phosphate: 37 mmol Acetate: 280.61  mEq Cl: 0 mEq  Current Nutrition:  Tube feeding @ trickle  Plan:  Start TPN at 75 mL/hr with lipids at 1800 which provides 1770 calories and 100% of estimated daily needs (lipids providing 15% of kcal) Electrolytes in TPN: Na 61mEq/L, K 9mEq/L, Ca 65mEq/L, Mg 10mEq/L, and Phos 20mmol/L. Max acetate.  Add standard MVI to TPN Remove trace elements due to elevated T bilirubin- add back selenium and zinc  Initiate Sensitive q4h SSI and adjust as needed  KCL 60 meq plus kphos 30 mmol IV once per CCM  Monitor TPN labs on Mon/Thurs F/u tolerance to tube feeds  F/u triglycerides as patient has history of hypertriglyceridemia with lipids in TPN F/u cycling TPN once electrolytes are stable   Jerry [name] Jerry [name] 02/28/2024,10:05 AM

## 2024-02-28 NOTE — Plan of Care (Signed)
 Problem: Fluid Volume: Goal: Hemodynamic stability will improve 02/28/2024 1653 by Honore Marval LABOR, RN Outcome: Not Progressing 02/28/2024 1328 by Honore Marval LABOR, RN Outcome: Not Progressing   Problem: Clinical Measurements: Goal: Diagnostic test results will improve 02/28/2024 1653 by Honore Marval LABOR, RN Outcome: Not Progressing 02/28/2024 1328 by Honore Marval LABOR, RN Outcome: Not Progressing Goal: Signs and symptoms of infection will decrease 02/28/2024 1653 by Honore Marval LABOR, RN Outcome: Not Progressing 02/28/2024 1328 by Honore Marval LABOR, RN Outcome: Not Progressing   Problem: Respiratory: Goal: Ability to maintain adequate ventilation will improve 02/28/2024 1653 by Honore Marval LABOR, RN Outcome: Not Progressing 02/28/2024 1328 by Honore Marval LABOR, RN Outcome: Not Progressing   Problem: Education: Goal: Ability to describe self-care measures that may prevent or decrease complications (Diabetes Survival Skills Education) will improve 02/28/2024 1653 by Honore Marval LABOR, RN Outcome: Not Progressing 02/28/2024 1328 by Honore Marval LABOR, RN Outcome: Not Progressing Goal: Individualized Educational Video(s) 02/28/2024 1653 by Honore Marval LABOR, RN Outcome: Not Progressing 02/28/2024 1328 by Honore Marval LABOR, RN Outcome: Not Progressing   Problem: Coping: Goal: Ability to adjust to condition or change in health will improve 02/28/2024 1653 by Honore Marval LABOR, RN Outcome: Not Progressing 02/28/2024 1328 by Honore Marval LABOR, RN Outcome: Not Progressing   Problem: Fluid Volume: Goal: Ability to maintain a balanced intake and output will improve 02/28/2024 1653 by Honore Marval LABOR, RN Outcome: Not Progressing 02/28/2024 1328 by Honore Marval LABOR, RN Outcome: Not Progressing   Problem: Health Behavior/Discharge Planning: Goal: Ability to identify and utilize available resources and services will improve 02/28/2024 1653 by Honore Marval LABOR, RN Outcome: Not Progressing 02/28/2024 1328 by Honore Marval LABOR, RN Outcome: Not Progressing Goal: Ability to manage health-related needs will improve 02/28/2024 1653 by Honore Marval LABOR, RN Outcome: Not Progressing 02/28/2024 1328 by Honore Marval LABOR, RN Outcome: Not Progressing   Problem: Metabolic: Goal: Ability to maintain appropriate glucose levels will improve 02/28/2024 1653 by Honore Marval LABOR, RN Outcome: Not Progressing 02/28/2024 1328 by Honore Marval LABOR, RN Outcome: Not Progressing   Problem: Nutritional: Goal: Maintenance of adequate nutrition will improve 02/28/2024 1653 by Honore Marval LABOR, RN Outcome: Not Progressing 02/28/2024 1328 by Honore Marval LABOR, RN Outcome: Not Progressing Goal: Progress toward achieving an optimal weight will improve 02/28/2024 1653 by Honore Marval LABOR, RN Outcome: Not Progressing 02/28/2024 1328 by Honore Marval LABOR, RN Outcome: Not Progressing   Problem: Skin Integrity: Goal: Risk for impaired skin integrity will decrease 02/28/2024 1653 by Honore Marval LABOR, RN Outcome: Not Progressing 02/28/2024 1328 by Honore Marval LABOR, RN Outcome: Not Progressing   Problem: Tissue Perfusion: Goal: Adequacy of tissue perfusion will improve 02/28/2024 1653 by Honore Marval LABOR, RN Outcome: Not Progressing 02/28/2024 1328 by Honore Marval LABOR, RN Outcome: Not Progressing   Problem: Education: Goal: Knowledge of General Education information will improve Description: Including pain rating scale, medication(s)/side effects and non-pharmacologic comfort measures 02/28/2024 1653 by Honore Marval LABOR, RN Outcome: Not Progressing 02/28/2024 1328 by Honore Marval LABOR, RN Outcome: Not Progressing   Problem: Health Behavior/Discharge Planning: Goal: Ability to manage health-related needs will improve 02/28/2024 1653 by Honore Marval LABOR, RN Outcome: Not Progressing 02/28/2024 1328 by Honore Marval LABOR, RN Outcome: Not Progressing   Problem: Clinical Measurements: Goal: Ability to maintain clinical measurements within normal limits  will improve 02/28/2024 1653 by Honore Marval LABOR, RN Outcome: Not Progressing 02/28/2024 1328 by Honore Marval LABOR, RN Outcome: Not Progressing Goal: Will remain free  from infection 02/28/2024 1653 by Honore Marval LABOR, RN Outcome: Not Progressing 02/28/2024 1328 by Honore Marval LABOR, RN Outcome: Not Progressing Goal: Diagnostic test results will improve 02/28/2024 1653 by Honore Marval LABOR, RN Outcome: Not Progressing 02/28/2024 1328 by Honore Marval LABOR, RN Outcome: Not Progressing Goal: Respiratory complications will improve 02/28/2024 1653 by Honore Marval LABOR, RN Outcome: Not Progressing 02/28/2024 1328 by Honore Marval LABOR, RN Outcome: Not Progressing Goal: Cardiovascular complication will be avoided 02/28/2024 1653 by Honore Marval LABOR, RN Outcome: Not Progressing 02/28/2024 1328 by Honore Marval LABOR, RN Outcome: Not Progressing   Problem: Activity: Goal: Risk for activity intolerance will decrease 02/28/2024 1653 by Honore Marval LABOR, RN Outcome: Not Progressing 02/28/2024 1328 by Honore Marval LABOR, RN Outcome: Not Progressing   Problem: Nutrition: Goal: Adequate nutrition will be maintained 02/28/2024 1653 by Honore Marval LABOR, RN Outcome: Not Progressing 02/28/2024 1328 by Honore Marval LABOR, RN Outcome: Not Progressing   Problem: Coping: Goal: Level of anxiety will decrease 02/28/2024 1653 by Honore Marval LABOR, RN Outcome: Not Progressing 02/28/2024 1328 by Honore Marval LABOR, RN Outcome: Not Progressing   Problem: Elimination: Goal: Will not experience complications related to bowel motility 02/28/2024 1653 by Honore Marval LABOR, RN Outcome: Not Progressing 02/28/2024 1328 by Honore Marval LABOR, RN Outcome: Not Progressing Goal: Will not experience complications related to urinary retention 02/28/2024 1653 by Honore Marval LABOR, RN Outcome: Not Progressing 02/28/2024 1328 by Honore Marval LABOR, RN Outcome: Not Progressing   Problem: Pain Managment: Goal: General experience of comfort will improve and/or be  controlled 02/28/2024 1653 by Honore Marval LABOR, RN Outcome: Not Progressing 02/28/2024 1328 by Honore Marval LABOR, RN Outcome: Not Progressing   Problem: Safety: Goal: Ability to remain free from injury will improve 02/28/2024 1653 by Honore Marval LABOR, RN Outcome: Not Progressing 02/28/2024 1328 by Honore Marval LABOR, RN Outcome: Not Progressing   Problem: Skin Integrity: Goal: Risk for impaired skin integrity will decrease 02/28/2024 1653 by Honore Marval LABOR, RN Outcome: Not Progressing 02/28/2024 1328 by Honore Marval LABOR, RN Outcome: Not Progressing

## 2024-02-29 ENCOUNTER — Inpatient Hospital Stay (HOSPITAL_COMMUNITY)

## 2024-02-29 DIAGNOSIS — A419 Sepsis, unspecified organism: Secondary | ICD-10-CM | POA: Diagnosis not present

## 2024-02-29 DIAGNOSIS — Z930 Tracheostomy status: Secondary | ICD-10-CM | POA: Diagnosis not present

## 2024-02-29 DIAGNOSIS — R6520 Severe sepsis without septic shock: Secondary | ICD-10-CM | POA: Diagnosis not present

## 2024-02-29 DIAGNOSIS — J9610 Chronic respiratory failure, unspecified whether with hypoxia or hypercapnia: Secondary | ICD-10-CM | POA: Diagnosis not present

## 2024-02-29 HISTORY — PX: IR CHOLANGIOGRAM EXISTING TUBE: IMG6040

## 2024-02-29 LAB — CBC WITH DIFFERENTIAL/PLATELET
Abs Immature Granulocytes: 0.11 K/uL — ABNORMAL HIGH (ref 0.00–0.07)
Basophils Absolute: 0 K/uL (ref 0.0–0.1)
Basophils Relative: 0 %
Eosinophils Absolute: 0 K/uL (ref 0.0–0.5)
Eosinophils Relative: 0 %
HCT: 26.4 % — ABNORMAL LOW (ref 39.0–52.0)
Hemoglobin: 8.6 g/dL — ABNORMAL LOW (ref 13.0–17.0)
Immature Granulocytes: 1 %
Lymphocytes Relative: 8 %
Lymphs Abs: 1.3 K/uL (ref 0.7–4.0)
MCH: 27 pg (ref 26.0–34.0)
MCHC: 32.6 g/dL (ref 30.0–36.0)
MCV: 83 fL (ref 80.0–100.0)
Monocytes Absolute: 0.5 K/uL (ref 0.1–1.0)
Monocytes Relative: 4 %
Neutro Abs: 13.1 K/uL — ABNORMAL HIGH (ref 1.7–7.7)
Neutrophils Relative %: 87 %
Platelets: 228 K/uL (ref 150–400)
RBC: 3.18 MIL/uL — ABNORMAL LOW (ref 4.22–5.81)
RDW: 17 % — ABNORMAL HIGH (ref 11.5–15.5)
WBC: 15 K/uL — ABNORMAL HIGH (ref 4.0–10.5)
nRBC: 0 % (ref 0.0–0.2)

## 2024-02-29 LAB — PHOSPHORUS
Phosphorus: 1.4 mg/dL — ABNORMAL LOW (ref 2.5–4.6)
Phosphorus: 2.1 mg/dL — ABNORMAL LOW (ref 2.5–4.6)

## 2024-02-29 LAB — GLUCOSE, CAPILLARY
Glucose-Capillary: 190 mg/dL — ABNORMAL HIGH (ref 70–99)
Glucose-Capillary: 194 mg/dL — ABNORMAL HIGH (ref 70–99)
Glucose-Capillary: 191 mg/dL — ABNORMAL HIGH (ref 70–99)
Glucose-Capillary: 258 mg/dL — ABNORMAL HIGH (ref 70–99)
Glucose-Capillary: 221 mg/dL — ABNORMAL HIGH (ref 70–99)
Glucose-Capillary: 186 mg/dL — ABNORMAL HIGH (ref 70–99)

## 2024-02-29 LAB — COMPREHENSIVE METABOLIC PANEL WITH GFR
ALT: 56 U/L — ABNORMAL HIGH (ref 0–44)
AST: 35 U/L (ref 15–41)
Albumin: 2.2 g/dL — ABNORMAL LOW (ref 3.5–5.0)
Alkaline Phosphatase: 234 U/L — ABNORMAL HIGH (ref 38–126)
Anion gap: 9 (ref 5–15)
BUN: 13 mg/dL (ref 6–20)
CO2: 19 mmol/L — ABNORMAL LOW (ref 22–32)
Calcium: 8.3 mg/dL — ABNORMAL LOW (ref 8.9–10.3)
Chloride: 104 mmol/L (ref 98–111)
Creatinine, Ser: <0.3 mg/dL — ABNORMAL LOW (ref 0.61–1.24)
Glucose, Bld: 210 mg/dL — ABNORMAL HIGH (ref 70–99)
Potassium: 3.6 mmol/L (ref 3.5–5.1)
Sodium: 132 mmol/L — ABNORMAL LOW (ref 135–145)
Total Bilirubin: 3.8 mg/dL — ABNORMAL HIGH (ref 0.0–1.2)
Total Protein: 6.5 g/dL (ref 6.5–8.1)

## 2024-02-29 LAB — TRIGLYCERIDES: Triglycerides: 160 mg/dL — ABNORMAL HIGH (ref <150)

## 2024-02-29 LAB — MAGNESIUM: Magnesium: 2.1 mg/dL (ref 1.7–2.4)

## 2024-02-29 MED ORDER — OSMOLITE 1.2 CAL PO LIQD
1000.0000 mL | ORAL | Status: DC
  Administered 2024-02-29 – 2024-03-01 (×2): 1000 mL
  Filled 2024-02-29 (×2): qty 1000

## 2024-02-29 MED ORDER — SIMETHICONE 80 MG PO CHEW: 80.0000 mg | CHEWABLE_TABLET | Freq: Four times a day (QID) | ORAL | Status: DC | PRN

## 2024-02-29 MED ORDER — INSULIN ASPART 100 UNIT/ML IJ SOLN
0.0000 [IU] | INTRAMUSCULAR | Status: DC
  Administered 2024-02-29: 7 [IU] via SUBCUTANEOUS
  Administered 2024-02-29: 11 [IU] via SUBCUTANEOUS
  Administered 2024-03-01 – 2024-03-02 (×8): 4 [IU] via SUBCUTANEOUS
  Administered 2024-03-02: 3 [IU] via SUBCUTANEOUS
  Administered 2024-03-02 (×2): 4 [IU] via SUBCUTANEOUS
  Administered 2024-03-02 – 2024-03-03 (×3): 7 [IU] via SUBCUTANEOUS
  Administered 2024-03-03 (×3): 4 [IU] via SUBCUTANEOUS
  Administered 2024-03-03: 7 [IU] via SUBCUTANEOUS
  Administered 2024-03-04: 3 [IU] via SUBCUTANEOUS
  Administered 2024-03-04: 4 [IU] via SUBCUTANEOUS
  Administered 2024-03-04: 3 [IU] via SUBCUTANEOUS
  Administered 2024-03-04 – 2024-03-05 (×6): 4 [IU] via SUBCUTANEOUS
  Administered 2024-03-05: 3 [IU] via SUBCUTANEOUS
  Administered 2024-03-05 – 2024-03-06 (×4): 4 [IU] via SUBCUTANEOUS
  Administered 2024-03-06: 7 [IU] via SUBCUTANEOUS
  Administered 2024-03-06 (×2): 4 [IU] via SUBCUTANEOUS
  Administered 2024-03-06 – 2024-03-12 (×21): 3 [IU] via SUBCUTANEOUS

## 2024-02-29 MED ORDER — POLYETHYLENE GLYCOL 3350 17 G PO PACK
17.0000 g | PACK | Freq: Every day | ORAL | Status: DC
  Administered 2024-02-29 – 2024-03-12 (×11): 17 g
  Filled 2024-02-29 (×12): qty 1

## 2024-02-29 MED ORDER — TRAVASOL 10 % IV SOLN
INTRAVENOUS | Status: AC
  Filled 2024-02-29: qty 1008

## 2024-02-29 MED ORDER — ORAL CARE MOUTH RINSE
15.0000 mL | OROMUCOSAL | Status: DC
  Administered 2024-02-29 – 2024-03-12 (×147): 15 mL via OROMUCOSAL

## 2024-02-29 MED ORDER — POTASSIUM PHOSPHATES 15 MMOLE/5ML IV SOLN
45.0000 mmol | Freq: Once | INTRAVENOUS | Status: AC
  Administered 2024-02-29: 45 mmol via INTRAVENOUS
  Filled 2024-02-29: qty 15

## 2024-02-29 MED ORDER — ORAL CARE MOUTH RINSE: 15.0000 mL | OROMUCOSAL | Status: DC | PRN

## 2024-02-29 MED ORDER — IOHEXOL 300 MG/ML  SOLN
50.0000 mL | Freq: Once | INTRAMUSCULAR | Status: AC | PRN
  Administered 2024-02-29: 10 mL

## 2024-02-29 MED ORDER — METOCLOPRAMIDE HCL 5 MG/ML IJ SOLN
10.0000 mg | Freq: Three times a day (TID) | INTRAMUSCULAR | Status: DC
  Administered 2024-02-29 – 2024-03-04 (×12): 10 mg via INTRAVENOUS
  Filled 2024-02-29 (×12): qty 2

## 2024-02-29 MED ORDER — DOCUSATE SODIUM 50 MG/5ML PO LIQD
100.0000 mg | Freq: Two times a day (BID) | ORAL | Status: DC
  Administered 2024-02-29 – 2024-03-12 (×22): 100 mg
  Filled 2024-02-29 (×22): qty 10

## 2024-02-29 NOTE — Progress Notes (Signed)
 NAME:  Jerry [name], MRN:  [id number], DOB:  1970/12/06, LOS: 3 ADMISSION DATE:  02/26/2024, CONSULTATION DATE: 02/22/2024 REFERRING MD:  Ruthe Cornet, , CHIEF COMPLAINT: Abdominal pain, nausea and vomiting  History of Present Illness:  53 year old male with advanced ALS, status post trach, vent dependent/PEG, on TPN due to not being able to tolerate tube feeds, who is being cared at home by his wife, was brought into the emergency department with complaint of abdominal pain, nausea and vomiting since yesterday.  Per patient's wife he was complaining of abdominal pain, had 3 episodes of nonbilious vomiting so she brought him here in the emergency department.  On workup he was noted to have possible acute cholecystitis, seen by general surgery recommend percutaneous cholecystostomy tube drainage.  PCCM was consulted for help evaluation and ventilator management Of note patient is spiking fever Tmax 101.7  Pertinent  Medical History   Past Medical History:  Diagnosis Date   ALS (amyotrophic lateral sclerosis) (HCC)    COVID-19 virus infection 06/2020   Meningitis 2003   spinal   Morbid obesity (HCC)    Neuromuscular disorder (HCC)    Prediabetes 11/05/2016   A1C 5.7 on 11/05/16   Type 2 diabetes mellitus (HCC) 08/31/2023    Significant Hospital Events: Including procedures, antibiotic start and stop dates in addition to other pertinent events   8/24 perc cholecystostomy by IR 8/26 persistent fever and leukocytosis 8/26 trickle tube feeds started  Interim History / Subjective:   Critically ill, on home vent Defervesced, now low-grade febrile Remains tachycardic Complains of bloating  Objective    Blood pressure 112/72, pulse (!) 120, temperature 100.2 F (37.9 C), temperature source Oral, resp. rate 19, weight 82.5 kg, SpO2 100%.    Vent Mode: AC;Other (Comment) FiO2 (%):  [32 %] 32 % Set Rate:  [16 bmp] 16 bmp Vt Set:  [500 mL] 500 mL PEEP:  [5 cmH20] 5 cmH20 Plateau  Pressure:  [19 cmH20-21 cmH20] 19 cmH20   Intake/Output Summary (Last 24 hours) at 02/29/2024 1202 Last data filed at 02/29/2024 1100 Gross per 24 hour  Intake 3254.81 ml  Output 1075 ml  Net 2179.81 ml   Filed Weights   02/26/24 1730 02/27/24 0500 02/28/24 0310  Weight: 76.9 kg 77.3 kg 82.5 kg    Examination: General: Chronically ill-appearing male, lying on the bed , communicates with eyebrows x 2=yes, x1 =no HEENT: [place]/AT, eyes anicteric.  moist mucus membranes.  Status post trach, vent dependent Neuro: Alert, interactive with his  laptop , diffuse muscle wasting Chest: Bilateral ventilated breath sounds, no accessory muscle use Heart: Regular rate and rhythm, no murmurs or gallops Abdomen: Soft, no tenderness, no guarding  Labs show decreased leukocytosis 15K, hyponatremia, hypophosphatemia, albumin 2.2, drop in hemoglobin from 10.7-8.6   Resolved problem list   Assessment and Plan   Sepsis due to Acute cholecystitis s/p Perc cholecystostomy tube placement by IR - Due to rising WBC and fevers ,remain vigilant for bacteremia from other causes such as PICC line - PICC line DC'd 8/26 - Follow blood cultures - Biliary tube with minimal drainage, surgery following , cholangiogram 8/27 shows good position - Continue Zosyn , ok to DC vancomycin    Chronic hypoxic respiratory failure status post trach, vent dependent Advanced ALS    Continue on home vent VAP prevention bundle in place Continue routine trach care  Dysphagia status post PEG, not tolerating tube feeds, currently on TPN Severe protein calorie malnutrition - For some reason he has been maintained  on chronic TNA since episode of ileus in 2023 - Resume trickle tube feeds , add Reglan  and see if he will tolerate during hospital stay ,  increase only by 10 cc/day - Continue TNA for now -Will also plan for tube exchange to GJ tube and use postpyloric feeding if he does not tolerate gastric feeding  Updated patient  and wife who agree with care plan  Labs   CBC: Recent Labs  Lab 02/26/24 0424 02/26/24 0737 02/26/24 1005 02/27/24 0441 02/28/24 0307 02/29/24 0436  WBC 8.0  --  10.4 18.8* 22.3* 15.0*  NEUTROABS 6.6  --  8.2*  --  19.5* 13.1*  HGB 8.6* 11.9* 11.9* 11.5* 10.7* 8.6*  HCT 31.0* 35.0* 37.0* 34.9* 32.7* 26.4*  MCV 99.0  --  84.3 81.5 83.2 83.0  PLT 165  --  151 207 239 228    Basic Metabolic Panel: Recent Labs  Lab 02/26/24 0726 02/26/24 0737 02/26/24 1824 02/27/24 0441 02/28/24 0307 02/29/24 0436  NA 134* 137 133* 134* 134* 132*  K 3.5 3.5 2.2* 3.2* 3.0* 3.6  CL 105  --  103 109 108 104  CO2 17*  --  17* 15* 16* 19*  GLUCOSE 170*  --  117* 87 107* 210*  BUN 7  --  5* 8 13 13   CREATININE <0.30*  --  <0.30* 0.30* <0.30* <0.30*  CALCIUM  9.5  --  8.9 8.9 8.8* 8.3*  MG  --   --   --  1.6* 2.4 2.1  PHOS  --   --   --  2.0* 1.8* 1.4*   GFR: CrCl cannot be calculated (This lab value cannot be used to calculate CrCl because it is not a number: <0.30). Recent Labs  Lab 02/26/24 0437 02/26/24 0737 02/26/24 1005 02/26/24 1829 02/27/24 0441 02/28/24 0307 02/29/24 0436  WBC  --   --  10.4  --  18.8* 22.3* 15.0*  LATICACIDVEN 0.6 2.3*  --  1.0  --   --   --     Liver Function Tests: Recent Labs  Lab 02/26/24 0703 02/26/24 0726 02/26/24 1824 02/27/24 0441 02/29/24 0436  AST DUP 162* 106* 85* 35  ALT DUP 163* 124* 108* 56*  ALKPHOS DUP 472* 373* 324* 234*  BILITOT DUP 3.8* 5.0* 6.5* 3.8*  PROT DUP 8.4* 7.5 7.6 6.5  ALBUMIN DUP 3.3* 2.8* 2.7* 2.2*   Recent Labs  Lab 02/26/24 0424  LIPASE 20   No results for input(s): AMMONIA in the last 168 hours.  ABG    Component Value Date/Time   PHART 7.52 (H) 01/09/2022 1810   PCO2ART 26 (L) 01/09/2022 1810   PO2ART 66 (L) 01/09/2022 1810   HCO3 19.2 (L) 02/26/2024 0737   TCO2 20 (L) 02/26/2024 0737   ACIDBASEDEF 3.0 (H) 02/26/2024 0737   O2SAT 94 02/26/2024 0737     Coagulation Profile: Recent Labs  Lab  02/27/24 0441  INR 1.4*    Cardiac Enzymes: No results for input(s): CKTOTAL, CKMB, CKMBINDEX, TROPONINI in the last 168 hours.  HbA1C: Hgb A1c MFr Bld  Date/Time Value Ref Range Status  02/26/2024 06:28 PM 5.2 4.8 - 5.6 % Final    Comment:    (NOTE) Diagnosis of Diabetes The following HbA1c ranges recommended by the American Diabetes Association (ADA) may be used as an aid in the diagnosis of diabetes mellitus.  Hemoglobin             Suggested A1C NGSP%  Diagnosis  <5.7                   Non Diabetic  5.7-6.4                Pre-Diabetic  >6.4                   Diabetic  <7.0                   Glycemic control for                       adults with diabetes.    12/09/2021 11:05 AM 5.4 4.8 - 5.6 % Final    Comment:    (NOTE) Pre diabetes:          5.7%-6.4%  Diabetes:              >6.4%  Glycemic control for   <7.0% adults with diabetes     CBG: Recent Labs  Lab 02/28/24 2001 02/28/24 2339 02/29/24 0413 02/29/24 0809 02/29/24 1109  GLUCAP 167* 155* 190* 194* 191*   My independent critical care time was 31 minutes   Carmeline Kowal V. Jude MD [facility] See Amion for pager If no response to pager, please call [phone] until 7pm After 7pm, Please call E-link [phone]

## 2024-02-29 NOTE — Progress Notes (Signed)
 Nutrition Follow-up  DOCUMENTATION CODES:  Not applicable  INTERVENTION:  Adjust tube formula and keep at 58mL/h until tolerance with Reglan  can be assessed. Pt planning to undergo G-J conversion tomorrow. Will attempt through J port if gastric feeds not tolerated: Osmolite 1.2 at 50 ml/h (1200 ml per day) When able to advance, increase to 20 and advance by 10mL every 24 hours to reach goal Prosource TF20 60 ml 1x/d Provides 1520 kcal, 87 gm protein, 984 ml free water  daily Continue TPN - management per pharmacy   NUTRITION DIAGNOSIS:  Inadequate oral intake related to inability to eat, altered GI function as evidenced by NPO status. - remains applicable   GOAL:  Patient will meet greater than or equal to 90% of their needs - progressing, being met with TPN  MONITOR:  Labs, Weight trends, I & O's  REASON FOR ASSESSMENT:  Consult Enteral/tube feeding initiation and management  ASSESSMENT:  53 year old male who presented to the ED on 02/26/24 with abdominal pain, distention, and emesis. PMH of advanced ALS, chronic hypoxic respiratory failure s/p trach, vent dependence, G-tube in place, TPN dependence. Pt admitted with sepsis due to acute cholecystitis.  8/24 - s/p cholecystostomy catheter placement by IR    Patient resting in bed at the time of assessment, on home ventilator. TF were trialed last night at trickle of 62mL/h. RN reports no vomiting but that pt complaining of bloating and pain this AM. TF were stopped per surgery team's recommendation.   Pt discussed during ICU rounds and with RN and MD. MD requests that gut motility agent be started and trickle resumed to provide pt with every opportunity to trial enteral nutrition with the hope he could one day stop PN. Pt continuing to pass stool.   Pt planned to undergo tube exchange in IR and will have G-J conversion placed in place of PEG. If pt unable to tolerate g-tube feeds with motility agent, will attempt to feed in the  small bowel after tube is cleared for use.   Adjusted formula to Osmolite 1.2 as it would be utilized if pt is able to start advancing. Will continue to monitor and advance as able pending tolerance.   MV: 8.2 L/min Temp (24hrs), Avg:99.5 F (37.5 C), Min:97.9 F (36.6 C), Max:100.2 F (37.9 C) MAP (cuff): 105 mmHg  Admit weight: 76.9 kg  Current weight: 82.5 kg    Intake/Output Summary (Last 24 hours) at 02/29/2024 1350 Last data filed at 02/29/2024 1300 Gross per 24 hour  Intake 3533.22 ml  Output 830 ml  Net 2703.22 ml  Net IO Since Admission: 6,692.84 mL [02/29/24 1350]  Drains/Lines: Implanted port PEG JP drain 25mL x 24 hours UOP x 24 hours  Nutritionally Relevant Medications: Scheduled Meds:  VITAL HIGH PROTEIN  1,000 mL Per Tube Q24H   glycopyrrolate   0.2 mg Intravenous BID   scopolamine   1 patch Transdermal Q72H   Continuous Infusions:  piperacillin -tazobactam (ZOSYN )  IV 12.5 mL/hr at 02/29/24 1100   potassium PHOSPHATE  IVPB (in mmol) 64.4 mL/hr at 02/29/24 1100   TPN ADULT (ION) 75 mL/hr at 02/29/24 0600   PRN Meds: ondansetron , polyethylene glycol  Labs Reviewed: Sodium 132 Creatinine <0.30 Phosphorus 1.4 CBG ranges from 101-194 mg/dL over the last 24 hours HgbA1c 5.2  NUTRITION - FOCUSED PHYSICAL EXAM: Flowsheet Row Most Recent Value  Orbital Region No depletion  Upper Arm Region No depletion  Thoracic and Lumbar Region No depletion  Buccal Region No depletion  Temple Region Mild depletion  Clavicle Bone Region No depletion  Clavicle and Acromion Bone Region Mild depletion  Scapular Bone Region Unable to assess  Dorsal Hand No depletion  Patellar Region Unable to assess  Anterior Thigh Region Unable to assess  Posterior Calf Region Unable to assess  Edema (RD Assessment) None  Hair Reviewed  Eyes Unable to assess  Mouth Unable to assess  Skin Reviewed  Nails Reviewed    Diet Order:   Diet Order             Diet NPO time  specified  Diet effective midnight           Diet NPO time specified  Diet effective now                   EDUCATION NEEDS:  Not appropriate for education at this time  Skin:  Skin Assessment: Reviewed RN Assessment  Last BM:  8/27  Height:  Ht Readings from Last 1 Encounters:  02/29/24 5' 5 (1.651 m)    Weight:  Wt Readings from Last 1 Encounters:  02/28/24 82.5 kg    Ideal Body Weight:  55.6 kg (adjusted by 10% for quadriplegia)  BMI:  Body mass index is 30.27 kg/m.  Estimated Nutritional Needs:  Kcal:  1400-1600 kcal/d Protein:  80-95 g/d Fluid:  1.5-1.8L/d    Vernell Lukes, RD, LDN, CNSC Registered Dietitian II Please reach out via secure chat

## 2024-02-29 NOTE — TOC CM/SW Note (Signed)
 Transition of Care [facility]) - Inpatient Brief Assessment   Patient Details  Name: Ondra Deboard MRN: [id number] Date of Birth: 08/16/1970  Transition of [facility]) CM/SW Contact:    Lauraine FORBES Saa, LCSWA Phone Number: 02/29/2024, 11:20 AM   Clinical Narrative:  11:20 AM Per progressions, patient is to receive GJ tube tomorrow. TOC will continue to follow and be available to assist.  Transition of Care Asessment: Insurance and Status: Insurance coverage has been reviewed Patient has primary care physician: Yes Home environment has been reviewed: Private Residence Prior level of function:: N/A Prior/Current Home Services: No current home services Social Drivers of Health Review: SDOH reviewed no interventions necessary Readmission risk has been reviewed: Yes (Currently Green 13%) Transition of care needs: no transition of care needs at this time

## 2024-02-29 NOTE — Progress Notes (Addendum)
 Patient seen and examined. IR engaged for confirmation of adequate tube placement given low volume o/p. Tbili and WBC downtrending today. Plan to start TF via g-tube today. Report of chronic ileus and no meaningful trial of enteral nutrition. Can consider adding pro-motility agents +/- exchange g-tube for GJ tube since he will go to IR today anyway. Surgical team to sign off, please reconsult with any questions or concerns.    Dreama GEANNIE Hanger, MD General and Trauma Surgery [facility]

## 2024-02-29 NOTE — Progress Notes (Signed)
 PHARMACY - TOTAL PARENTERAL NUTRITION CONSULT NOTE   Indication: Chronic ileus  Patient Measurements: Weight: 82.5 kg (181 lb 14.1 oz)   Body mass index is 30.27 kg/[name]. Usual Weight: 77.3 kg   Assessment: Patient is a 53 yo patient with ALS, bed bound at baseline, and chronic ileus who has received TPN for 2 years. He presented with nausea and vomiting, found to have cholecystitis, s/p cholecyststomy tube insertion 8/24.  Patient is with increased WBC count and persistent fever, on antibiotics. Concern for possible central line infection due to chronic TPN. Blood cultures pending. Per discussion with CCM, risk versus benefit to start TPN since patient has chronic port that will not be removed. Authorized per Dr. Alva to utilize the port for TPN.  Has been on TPN since 2023 due to recurring prolonged ileus. Patient is having bowel movements and no ileus suspected on imaging. Per surgery, patient, and wife, would like to trial tube feeds while inpatient.   8/27: Patient had large bowel movement last night. Having N/V and abdominal bloating.   Glucose / Insulin : A1c 5.2, CBGs max 210, 6u SSI   Electrolytes: Na 132, K 3.6 (after outside of TPN yesterday, CoCa 9.7, phos 1.4 (30 mmol outside of TPN), mg 2.1, Cl 104, Co2 19 Renal: Scr <0.30 Hepatic: Tbili 3.8 (down), AST/ALT 35/56, TG 160  Intake / Output; MIVF: UOP 0.72ml/kg/min, +5.7L since admit  GI Imaging: 8/24: CT abd suspicious for Acute Cholecystitis GI Surgeries / Procedures:  8/24 cholecyststomy tube insertion  Central access: Port (PICC removed for CLABSI concern 02/28/24) TPN start date: Year 2023   RD Assessment: Estimated Needs Total Energy Estimated Needs: 1700-1900 Total Protein Estimated Needs: 90-105 grams Total Fluid Estimated Needs: 1.7-1.9 L  TPN Formula Prior to Admission:  Infusion Duration: 18h  Total Kcal: 1331 on lipids days; 931 on non lipid days  Protein: 80g Dextrose : 180g  Lipids: 40g (no lipids  Tues/Wed/Thurs/Sat/Sun) Magnesium : 20 mEq Potassium: 180 mEq Phosphate: 37 mmol Acetate: 280.61 mEq Cl: 0 mEq  Current Nutrition:  Tube feeding paused, retrialing at 63ml/hr   Plan:  Continue TPN at 75 mL/hr with lipids at 1800 which provides 1770 calories and 100% of estimated daily needs (lipids providing 15% of kcal)  Electrolytes in TPN: increase Na 100 mEq/L, increase K 70 mEq/L, Ca 1mEq/L, increase Mg 15 mEq/L, and increase Phos 25 mmol/L. Max acetate.  Add standard MVI to TPN Add 0.5mL trace elements due to elevated T bilirubin and jaundice Increase to resistant q4h SSI and adjust as needed  Kphos 45 mmol IV once per CCM  Monitor TPN labs on Mon/Thurs F/u tolerance to tube feeds- increasing by 10 mL/hr every day per CCM  F/u triglycerides as patient has history of hypertriglyceridemia with lipids in TPN F/u cycling TPN once electrolytes are stable   Jerry [name] Jerry [name] 02/29/2024,8:20 AM

## 2024-02-29 NOTE — Consult Note (Addendum)
 Chief Complaint: Gastric distention, recurrent ileus, not tolerating gastrostomy tube feeds - IR consulted for gastrostomy to gastrojejunostomy tube conversion.   Referring Provider(s): Jude Harden GAILS, MD   Supervising Physician: [name]  Patient Status: [facility] - In-pt  History of Present Illness: Jerry [name] is a 53 y.o. male with hx of ALS, recurrent ileus, trach dependency, DM2. For current admission, pt initially presented to ED 3 days ago and had CT showing concern for cholecystitis. Given hx of ALS and other co-morbidities, pt had percutaneous cholecystostomy placed same day of 02/26/24. As separate issue during current stay, pt has been evaluated by dietician due to gastric distention with recommendations for gastrostomy to gastrojejunal conversion for more distal feeds. Hospitalist has now re-consulted IR for this conversion.   Today pt resting in bed with wife at bedside. Pt reporting gastric fullness. All questions answered.   Patient is Full Code  Past Medical History:  Diagnosis Date   ALS (amyotrophic lateral sclerosis) (HCC)    COVID-19 virus infection 06/2020   Meningitis 2003   spinal   Morbid obesity (HCC)    Neuromuscular disorder (HCC)    Prediabetes 11/05/2016   A1C 5.7 on 11/05/16   Type 2 diabetes mellitus (HCC) 08/31/2023    Past Surgical History:  Procedure Laterality Date   IR PERC CHOLECYSTOSTOMY  02/26/2024   IR REPLACE G-TUBE SIMPLE WO FLUORO  06/22/2022   MASS EXCISION Right 11/26/2014   Procedure: EXCISION MASS/GROIN EXCISION MASS;  Surgeon: Reyes LELON Cota, MD;  Location: [facility] ORS;  Service: General;  Laterality: Right;   MASS EXCISION Right 11/26/2014   Procedure: MINOR EXCISION OF MASS/POST THIGH MASS;  Surgeon: Reyes LELON Cota, MD;  Location: [facility] ORS;  Service: General;  Laterality: Right;   PEG PLACEMENT  10/27/2021   PORTACATH PLACEMENT Right 06/19/2019    Allergies: Georgianne cerise allergy]  Medications: Prior to Admission  medications   Medication Sig Start Date End Date Taking? Authorizing Provider  cetirizine  HCl (ZYRTEC ) 5 MG/5ML SOLN Take 10 mLs (10 mg total) by mouth at bedtime. 07/27/23  Yes Sowles, Krichna, MD  glycopyrrolate  (ROBINUL ) 1 MG tablet Place 1 tablet (1 mg total) into feeding tube 2 (two) times daily as needed. Patient taking differently: Place 1 mg into feeding tube 2 (two) times daily. 07/27/23  Yes Sowles, Krichna, MD  ipratropium-albuterol  (DUONEB) 0.5-2.5 (3) MG/3ML SOLN USE 3 ML VIA NEBULIZER EVERY 4 HOURS AS NEEDED Patient taking differently: Inhale 3 mLs into the lungs every 4 (four) hours as needed. 11/04/23  Yes Sowles, Krichna, MD  polyethylene glycol powder (GLYCOLAX /MIRALAX ) 17 GM/SCOOP powder Place 17 g into feeding tube daily. Titrate as needed for constipation 07/27/23  Yes Sowles, Krichna, MD  Polyvinyl Alcohol -Povidone (CLEAR EYES NATURAL TEARS) 5-6 MG/ML SOLN Apply 1 drop to eye 3 (three) times daily as needed. 07/27/23  Yes Sowles, Krichna, MD  Potassium Chloride  40 MEQ/15ML (20%) SOLN TAKE 15 ML BY MOUTH TWICE DAILY 08/26/23  Yes Sowles, Krichna, MD  PRESCRIPTION MEDICATION Home TPN   Yes [provider]  scopolamine  (TRANSDERM-SCOP) 1 MG/3DAYS PLACE 1 PATCH ONTO THE SKIN EVERY 3 DAYS 08/18/23  Yes Sowles, Krichna, MD  triamcinolone  cream (KENALOG ) 0.1 % Apply 1 Application topically 2 (two) times daily. Patient not taking: Reported on 02/26/2024 07/27/23   Sowles, Krichna, MD     Family History  Problem Relation Age of Onset   Diabetes Father    Diabetes Mother    Diabetes Sister    Hypertension Sister  Diabetes Brother    Diabetes Sister    Fibromyalgia Sister    Diabetes Sister     Social History   Socioeconomic History   Marital status: Married    Spouse name: Jerry [name]   Number of children: 3   Years of education: Not on file   Highest education level: High school graduate  Occupational History   Occupation: Biomedical scientist    Comment: self employed    Occupation: Consulting civil engineer    Comment: [employer]  Tobacco Use   Smoking status: Never   Smokeless tobacco: Never  Vaping Use   Vaping status: Never Used  Substance and Sexual Activity   Alcohol  use: No    Alcohol /week: 0.0 standard drinks of alcohol    Drug use: No   Sexual activity: Yes    Partners: Female  Other Topics Concern   Not on file  Social History Narrative   Lives with wife and 3 children   Approved for disability 02/2019 for ALS    Social Drivers of Health   Financial Resource Strain: Low Risk  (09/29/2022)   Overall Financial Resource Strain (CARDIA)    Difficulty of Paying Living Expenses: Not very hard  Food Insecurity: No Food Insecurity (02/26/2024)   Hunger Vital Sign    Worried About Running Out of Food in the Last Year: Never true    Ran Out of Food in the Last Year: Never true  Transportation Needs: No Transportation Needs (02/26/2024)   PRAPARE - Administrator, Civil Service (Medical): No    Lack of Transportation (Non-Medical): No  Physical Activity: Inactive (10/11/2023)   Exercise Vital Sign    Days of Exercise per Week: 0 days    Minutes of Exercise per Session: 0 min  Stress: Stress Concern Present (10/11/2023)   [employer] of Occupational Health - Occupational Stress Questionnaire    Feeling of Stress : To some extent  Social Connections: Unknown (10/11/2023)   Social Connection and Isolation Panel    Frequency of Communication with Friends and Family: More than three times a week    Frequency of Social Gatherings with Friends and Family: More than three times a week    Attends Religious Services: Not on Marketing executive or Organizations: No    Attends Banker Meetings: Never    Marital Status: Married     Review of Systems: A 12 point ROS discussed and pertinent positives are indicated in the HPI above.  All other systems are negative.  Review of Systems  Gastrointestinal:  Positive for abdominal  distention.    Vital Signs: BP 112/72   Pulse (!) 120   Temp 100.2 F (37.9 C) (Oral)   Resp 19   Wt 181 lb 14.1 oz (82.5 kg)   SpO2 100%   BMI 30.27 kg/m   Advance Care Plan: The advanced care place/surrogate decision maker was discussed at the time of visit and the patient did not wish to discuss or was not able to name a surrogate decision maker or provide an advance care plan.  Physical Exam Vitals and nursing note reviewed.  Constitutional:      General: He is not in acute distress. HENT:     Mouth/Throat:     Mouth: Mucous membranes are moist.     Pharynx: Oropharynx is clear.  Cardiovascular:     Rate and Rhythm: Tachycardia present.  Pulmonary:     Breath sounds: Normal breath sounds.  Comments: Trach in place. Abdominal:     General: There is distension.     Tenderness: There is no abdominal tenderness (no focal ttp).     Comments: + g tube. No overlying abnormality. Dressed appropriately  Skin:    General: Skin is warm and dry.  Neurological:     Mental Status: He is alert and oriented to person, place, and time. Mental status is at baseline.     Comments: Pt nonverbal, but communicates via computer. Is alert and oriented but limited evaluation due to communication ability     Imaging: IR Perc Cholecystostomy Result Date: 02/28/2024 CLINICAL DATA:  Abdominal pain. CT shows chronic Gallbladder Hydrops, but new simple density free fluid in the abdomen, indistinct appearance of the gallbladder wall, with layering sludge or stones. Constellation highly suspicious for Acute Cholecystitis. EXAM: PERCUTANEOUS CHOLECYSTOSTOMY TUBE PLACEMENT WITH ULTRASOUND AND FLUOROSCOPIC GUIDANCE FLUOROSCOPY: Radiation Exposure Index (as provided by the fluoroscopic device): 3.2 mGy air Kerma TECHNIQUE: The procedure, risks (including but not limited to bleeding, infection, organ damage ), benefits, and alternatives were explained to the patient. Questions regarding the procedure  were encouraged and answered. The patient understands and consents to the procedure. Survey ultrasound of the abdomen was performed and an appropriate skin entry site was identified. Skin site was marked, prepped with chlorhexidine , and draped in usual sterile fashion, and infiltrated locally with 1% lidocaine . Intravenous Fentanyl  100mcg and Versed  2mg  were administered by RN during a total moderate (conscious) sedation time of 12 minutes; the patient's level of consciousness and physiological / cardiorespiratory status were monitored continuously by radiology RN under my direct supervision. Under real-time ultrasound guidance, gallbladder was accessed using a transhepatic approach with a 21-gauge needle. Ultrasound image documentation was saved. Bile returned through the hub. Needle was exchanged over a 018 guidewire for transitional dilator which allowed placement of 035 J wire. Over this, a 10.2 French pigtail catheter was advanced and formed centrally in the gallbladder lumen. Small contrast injection confirmed appropriate position. Catheter secured externally with 0 Prolene suture and placed external drain bag. Patient tolerated the procedure well. COMPLICATIONS: COMPLICATIONS none IMPRESSION: 1. Technically successful percutaneous cholecystostomy tube placement with ultrasound and fluoroscopic guidance. Electronically Signed   By: [name] M.D.   On: 02/28/2024 07:56   CT CHEST ABDOMEN PELVIS W CONTRAST Result Date: 02/26/2024 CLINICAL DATA:  53 year old male with sepsis. Abdominal distension. Hypertension. EXAM: CT CHEST, ABDOMEN, AND PELVIS WITH CONTRAST TECHNIQUE: Multidetector CT imaging of the chest, abdomen and pelvis was performed following the standard protocol during bolus administration of intravenous contrast. RADIATION DOSE REDUCTION: This exam was performed according to the departmental dose-optimization program which includes automated exposure control, adjustment of the mA and/or kV  according to patient size and/or use of iterative reconstruction technique. CONTRAST:  75mL OMNIPAQUE  IOHEXOL  350 MG/ML SOLN COMPARISON:  Portable chest 0419 hours today. CTA chest 06/05/2023 and CT Abdomen and Pelvis 09/16/2023. FINDINGS: CT CHEST FINDINGS Cardiovascular: Chronic right chest Port-A-Cath. Normal heart size. No pericardial effusion. Aberrant origin right subclavian artery, normal variant. Little to no thoracic atherosclerosis identified. Mediastinum/Nodes: Negative for mediastinal mass, lymphadenopathy. Lungs/Pleura: Chronic tracheostomy is stable from last year. Small volume layering secretions in the trachea near the tube. Otherwise major airways are patent. Lung volumes are stable, chronic elevation of the left hemidiaphragm. Chronic left lung base atelectasis and small volume pleural effusion or pleural thickening, unchanged from last year. Mild respiratory motion. Mild contralateral dependent right lung atelectasis. No acute lung finding. Musculoskeletal: Stable.  No acute or suspicious osseous lesion. CT ABDOMEN PELVIS FINDINGS Hepatobiliary: Chronic gallbladder hydrops, but new perihepatic, pericholecystic free fluid (series 6, image 53). Layering sludge or stones series 3, image 83 is newly visible, and elsewhere the gallbladder wall appears indistinct (series 3, image 64). Liver enhancement remains normal. No bile duct dilatation. Pancreas: Stable atrophy. Spleen: Trace perisplenic free fluid is new. Otherwise stable and negative spleen. Adrenals/Urinary Tract: Normal adrenal glands. Bilateral nephrolithiasis. Symmetric renal enhancement and contrast excretion with no hydronephrosis or hydroureter. Distended urinary bladder. And there is a 5 mm calculus within the bladder now, to the left of midline dependently medial to the left UVJ. See series 3, image 104. The distal ureters and ureterovesical junctions are decompressed and negative. Stomach/Bowel: Featureless appearance of the  rectosigmoid colon similar to March. Redundant large bowel. Upstream proximal sigmoid, descending colon are decompressed. Gas distended non dependent transverse colon also containing some fluid. Upstream right colon is decompressed. Mildly gas and stool distended cecum. Cecum is on a lax mesentery. Terminal ileum is decompressed. Gas containing appendix remains normal near the midline on series 3, image 86. Nondilated small bowel. Percutaneous gastrostomy tube is new. Air and fluid within the stomach. Decompressed duodenum. No pneumoperitoneum identified. Free fluid with simple fluid density including tracking into the bilateral lower quadrants (coronal image 52). Vascular/Lymphatic: Normal caliber abdominal aorta. Major arterial structures, portal venous system are patent. No significant atherosclerosis. No lymphadenopathy. Reproductive: Negative. Other: No pelvis free fluid. Musculoskeletal: Stable. Chronic lower abdominal, pelvic and visible proximal lower extremity muscle atrophy which is diffuse, symmetric. No acute or suspicious osseous lesion. IMPRESSION: 1. Chronic Gallbladder Hydrops, but new simple density free fluid in the abdomen, indistinct appearance of the gallbladder wall, with layering sludge or stones. Constellation highly suspicious for Acute Cholecystitis. 2. No other definite acute or inflammatory process in the abdomen or pelvis; Redundant large bowel with featureless appearance of the rectosigmoid colon similar to March. No evidence of bowel obstruction.  New PEG tube.  Normal appendix. Bilateral nephrolithiasis, and new 5 mm urinary calculus within the bladder, but no acute obstructive uropathy. Chronic left lung base fibrothorax versus pleural effusion and atelectasis. Chronic tracheostomy. Generalized lower abdominal, pelvic, and proximal lower extremity muscle atrophy. Electronically Signed   By: VEAR Hurst M.D.   On: 02/26/2024 12:41   DG Chest Portable 1 View Result Date:  02/26/2024 CLINICAL DATA:  Fever. EXAM: PORTABLE CHEST 1 VIEW COMPARISON:  09/16/2023 FINDINGS: Low volume film. The cardio pericardial silhouette is enlarged. Tracheostomy tube remains in place. Right-sided Port-A-Cath tip overlies the right atrium. Left PICC line tip overlies the upper right atrium. But The lungs are clear without focal pneumonia, edema, pneumothorax or pleural effusion. Telemetry leads overlie the chest. IMPRESSION: Low volume film without acute cardiopulmonary findings. Electronically Signed   By: Camellia Candle M.D.   On: 02/26/2024 06:37    Labs:  CBC: Recent Labs    02/26/24 1005 02/27/24 0441 02/28/24 0307 02/29/24 0436  WBC 10.4 18.8* 22.3* 15.0*  HGB 11.9* 11.5* 10.7* 8.6*  HCT 37.0* 34.9* 32.7* 26.4*  PLT 151 207 239 228    COAGS: Recent Labs    02/27/24 0441  INR 1.4*    BMP: Recent Labs    11/11/23 0000 02/26/24 0424 02/26/24 0703 02/26/24 0726 02/26/24 1824 02/27/24 0441 02/28/24 0307 02/29/24 0436  NA  --    < > DUP   < > 133* 134* 134* 132*  K  --    < > DUP   < >  2.2* 3.2* 3.0* 3.6  CL  --    < > DUP   < > 103 109 108 104  CO2  --    < > DUP   < > 17* 15* 16* 19*  GLUCOSE  --    < > DUP   < > 117* 87 107* 210*  BUN  --    < > DUP   < > 5* 8 13 13   CALCIUM   --    < > DUP   < > 8.9 8.9 8.8* 8.3*  CREATININE  --    < > DUP   < > <0.30* 0.30* <0.30* <0.30*  GFRNONAA  --    < > DUP   < > NOT CALCULATED >60 NOT CALCULATED NOT CALCULATED  GFRAA 160  --  DUP  --   --   --   --   --    < > = values in this interval not displayed.    LIVER FUNCTION TESTS: Recent Labs    02/26/24 0726 02/26/24 1824 02/27/24 0441 02/29/24 0436  BILITOT 3.8* 5.0* 6.5* 3.8*  AST 162* 106* 85* 35  ALT 163* 124* 108* 56*  ALKPHOS 472* 373* 324* 234*  PROT 8.4* 7.5 7.6 6.5  ALBUMIN 3.3* 2.8* 2.7* 2.2*    TUMOR MARKERS: No results for input(s): AFPTM, CEA, CA199, CHROMGRNA in the last 8760 hours.  Assessment and Plan:  Giovanny Dugal is a 53  y.o. male with hx of ALS, recurrent ileus, trach dependency, DM2. For current admission, pt initially presented to ED 3 days ago and had CT showing concern for cholecystitis. Given hx of ALS and other co-morbidities, pt had percutaneous cholecystostomy placed same day of 02/26/24. As separate issue during current stay, pt has been evaluated by dietician due to gastric distention with recommendations for gastrostomy to gastrojejunal conversion for more distal feeds. Hospitalist has now re-consulted IR for this conversion.   Risks and benefits discussed with the patient including, but not limited to the need for a barium enema during the procedure, bleeding, infection, peritonitis, or damage to adjacent structures. All of the patient and wife's questions were answered, patient and wife is agreeable to proceed. Consent signed and in chart.   Thank you for allowing our service to participate in Jerry [name] 's care.  Electronically Signed: Kimble VEAR Clas, PA-C   02/29/2024, 10:38 AM      I spent a total of 20 Minutes    in face to face in clinical consultation, greater than 50% of which was counseling/coordinating care for gastrostomy to gastrojejunostomy exchange.

## 2024-02-29 NOTE — Progress Notes (Signed)
 Patient transported to IR and back to room 2M12 on home ventilator without complications.

## 2024-03-01 ENCOUNTER — Inpatient Hospital Stay (HOSPITAL_COMMUNITY)

## 2024-03-01 DIAGNOSIS — K810 Acute cholecystitis: Secondary | ICD-10-CM | POA: Diagnosis not present

## 2024-03-01 DIAGNOSIS — Z789 Other specified health status: Secondary | ICD-10-CM

## 2024-03-01 DIAGNOSIS — J9611 Chronic respiratory failure with hypoxia: Secondary | ICD-10-CM | POA: Diagnosis not present

## 2024-03-01 DIAGNOSIS — G1221 Amyotrophic lateral sclerosis: Secondary | ICD-10-CM | POA: Diagnosis not present

## 2024-03-01 HISTORY — PX: IR GJ TUBE CHANGE: IMG1440

## 2024-03-01 LAB — COMPREHENSIVE METABOLIC PANEL WITH GFR
ALT: 46 U/L — ABNORMAL HIGH (ref 0–44)
AST: 31 U/L (ref 15–41)
Albumin: 2.2 g/dL — ABNORMAL LOW (ref 3.5–5.0)
Alkaline Phosphatase: 249 U/L — ABNORMAL HIGH (ref 38–126)
Anion gap: 13 (ref 5–15)
BUN: 13 mg/dL (ref 6–20)
CO2: 20 mmol/L — ABNORMAL LOW (ref 22–32)
Calcium: 8.4 mg/dL — ABNORMAL LOW (ref 8.9–10.3)
Chloride: 100 mmol/L (ref 98–111)
Creatinine, Ser: <0.3 mg/dL — ABNORMAL LOW (ref 0.61–1.24)
Glucose, Bld: 168 mg/dL — ABNORMAL HIGH (ref 70–99)
Potassium: 4.3 mmol/L (ref 3.5–5.1)
Sodium: 133 mmol/L — ABNORMAL LOW (ref 135–145)
Total Bilirubin: 2.3 mg/dL — ABNORMAL HIGH (ref 0.0–1.2)
Total Protein: 6.5 g/dL (ref 6.5–8.1)

## 2024-03-01 LAB — CBC WITH DIFFERENTIAL/PLATELET
Abs Immature Granulocytes: 0.2 K/uL — ABNORMAL HIGH (ref 0.00–0.07)
Basophils Absolute: 0.1 K/uL (ref 0.0–0.1)
Basophils Relative: 1 %
Eosinophils Absolute: 0 K/uL (ref 0.0–0.5)
Eosinophils Relative: 0 %
HCT: 24.9 % — ABNORMAL LOW (ref 39.0–52.0)
Hemoglobin: 8.2 g/dL — ABNORMAL LOW (ref 13.0–17.0)
Immature Granulocytes: 2 %
Lymphocytes Relative: 14 %
Lymphs Abs: 1.6 K/uL (ref 0.7–4.0)
MCH: 27.3 pg (ref 26.0–34.0)
MCHC: 32.9 g/dL (ref 30.0–36.0)
MCV: 83 fL (ref 80.0–100.0)
Monocytes Absolute: 0.7 K/uL (ref 0.1–1.0)
Monocytes Relative: 6 %
Neutro Abs: 8.7 K/uL — ABNORMAL HIGH (ref 1.7–7.7)
Neutrophils Relative %: 77 %
Platelets: 249 K/uL (ref 150–400)
RBC: 3 MIL/uL — ABNORMAL LOW (ref 4.22–5.81)
RDW: 16.7 % — ABNORMAL HIGH (ref 11.5–15.5)
WBC: 11.3 K/uL — ABNORMAL HIGH (ref 4.0–10.5)
nRBC: 0 % (ref 0.0–0.2)

## 2024-03-01 LAB — GLUCOSE, CAPILLARY
Glucose-Capillary: 185 mg/dL — ABNORMAL HIGH (ref 70–99)
Glucose-Capillary: 184 mg/dL — ABNORMAL HIGH (ref 70–99)
Glucose-Capillary: 153 mg/dL — ABNORMAL HIGH (ref 70–99)
Glucose-Capillary: 103 mg/dL — ABNORMAL HIGH (ref 70–99)
Glucose-Capillary: 162 mg/dL — ABNORMAL HIGH (ref 70–99)
Glucose-Capillary: 193 mg/dL — ABNORMAL HIGH (ref 70–99)

## 2024-03-01 LAB — PHOSPHORUS: Phosphorus: 2 mg/dL — ABNORMAL LOW (ref 2.5–4.6)

## 2024-03-01 LAB — TRIGLYCERIDES: Triglycerides: 116 mg/dL (ref <150)

## 2024-03-01 LAB — MAGNESIUM: Magnesium: 2.2 mg/dL (ref 1.7–2.4)

## 2024-03-01 MED ORDER — LIDOCAINE VISCOUS HCL 2 % MT SOLN
15.0000 mL | Freq: Once | OROMUCOSAL | Status: AC
  Administered 2024-03-01: 8 mL via OROMUCOSAL
  Filled 2024-03-01: qty 15

## 2024-03-01 MED ORDER — TRAVASOL 10 % IV SOLN
INTRAVENOUS | Status: AC
  Filled 2024-03-01: qty 1008

## 2024-03-01 MED ORDER — LIDOCAINE VISCOUS HCL 2 % MT SOLN
OROMUCOSAL | Status: AC
  Filled 2024-03-01: qty 15

## 2024-03-01 MED ORDER — IOHEXOL 300 MG/ML  SOLN
50.0000 mL | Freq: Once | INTRAMUSCULAR | Status: AC | PRN
  Administered 2024-03-01: 20 mL

## 2024-03-01 MED ORDER — SODIUM PHOSPHATES 45 MMOLE/15ML IV SOLN
15.0000 mmol | Freq: Once | INTRAVENOUS | Status: AC
  Administered 2024-03-01: 15 mmol via INTRAVENOUS
  Filled 2024-03-01: qty 5

## 2024-03-01 NOTE — Progress Notes (Signed)
 Pt was transported to IR via home vent and back with no apparent complications at this time

## 2024-03-01 NOTE — Progress Notes (Signed)
 NAME:  Nasif Bos, MRN:  [id number], DOB:  11/22/1970, LOS: 4 ADMISSION DATE:  02/26/2024, CONSULTATION DATE: 02/22/2024 REFERRING MD:  Ruthe Cornet, , CHIEF COMPLAINT: Abdominal pain, nausea and vomiting  History of Present Illness:  53 year old male with advanced ALS, status post trach, vent dependent/PEG, on TPN due to not being able to tolerate tube feeds, who is being cared at home by his wife, was brought into the emergency department with complaint of abdominal pain, nausea and vomiting since yesterday.  Per patient's wife he was complaining of abdominal pain, had 3 episodes of nonbilious vomiting so she brought him here in the emergency department.  On workup he was noted to have possible acute cholecystitis, seen by general surgery recommend percutaneous cholecystostomy tube drainage.  PCCM was consulted for help evaluation and ventilator management Of note patient is spiking fever Tmax 101.7  Pertinent  Medical History   Past Medical History:  Diagnosis Date   ALS (amyotrophic lateral sclerosis) (HCC)    COVID-19 virus infection 06/2020   Meningitis 2003   spinal   Morbid obesity (HCC)    Neuromuscular disorder (HCC)    Prediabetes 11/05/2016   A1C 5.7 on 11/05/16   Type 2 diabetes mellitus (HCC) 08/31/2023    Significant Hospital Events: Including procedures, antibiotic start and stop dates in addition to other pertinent events   8/24 perc cholecystostomy by IR 8/26 persistent fever and leukocytosis 8/26 trickle tube feeds started 8/28 NPO for GJ tube exchanged. On TPN  Interim History / Subjective:   Remains on home vent, reports increased secretions Complains of bloating Denies pain  Objective    Blood pressure 111/74, pulse (!) 109, temperature 99 F (37.2 C), temperature source Axillary, resp. rate 17, height 5' 5 (1.651 m), weight 82.6 kg, SpO2 100%.    Vent Mode: AC FiO2 (%):  [32 %] 32 % Set Rate:  [16 bmp] 16 bmp Vt Set:  [500 mL] 500 mL PEEP:  [5  cmH20] 5 cmH20 Plateau Pressure:  [18 cmH20-19 cmH20] 19 cmH20   Intake/Output Summary (Last 24 hours) at 03/01/2024 1359 Last data filed at 03/01/2024 1300 Gross per 24 hour  Intake 2041.62 ml  Output 1800 ml  Net 241.62 ml   Filed Weights   02/27/24 0500 02/28/24 0310 03/01/24 0114  Weight: 77.3 kg 82.5 kg 82.6 kg    Examination: Physical Exam Constitutional:      General: He is not in acute distress.    Appearance: He is ill-appearing.     Comments: Interacts with ocular tracking device  Cardiovascular:     Rate and Rhythm: Normal rate and regular rhythm.     Pulses: Normal pulses.     Heart sounds: Normal heart sounds.  Pulmonary:     Effort: No respiratory distress.     Comments: Ventilated breath sounds bilaterally Abdominal:     General: There is distension.     Palpations: Abdomen is soft.  Neurological:     Mental Status: He is alert and oriented to person, place, and time.     Motor: Weakness present.     Assessment and Plan   Sepsis due to Acute cholecystitis s/p Perc cholecystostomy tube placement by IR - Due to rising WBC and fevers ,remain vigilant for bacteremia from other causes such as PICC line - PICC line DC'd 8/26 - Follow blood cultures - Biliary tube with minimal drainage, surgery following , cholangiogram 8/27 shows good position - Continue Zosyn , ok to DC vancomycin   Chronic hypoxic  respiratory failure status post trach, vent dependent Advanced ALS  Continued on home vent VAP prevention bundle Routine trach care Frequent trach suctioning Respiratory cultures growing Pseudomonas, sensitive to Zosyn   Likely represents colonization  Sensitive to Zosyn   Dysphagia status post PEG, not tolerating tube feeds, currently on TPN Severe protein calorie malnutrition - For some reason he has been maintained on chronic TPN since 2023 - trickle tube feeds restarted and increased slowly, reglan  added - Continue TNA for now - G tube exchange for GJ  tube today for post pyloric feeding - resume tube feeds and subsequently discontinue TPN  Wife and patient updated at the bedside, all questions were answered  Labs   CBC: Recent Labs  Lab 02/26/24 0424 02/26/24 0737 02/26/24 1005 02/27/24 0441 02/28/24 0307 02/29/24 0436 03/01/24 0950  WBC 8.0  --  10.4 18.8* 22.3* 15.0* 11.3*  NEUTROABS 6.6  --  8.2*  --  19.5* 13.1* 8.7*  HGB 8.6*   < > 11.9* 11.5* 10.7* 8.6* 8.2*  HCT 31.0*   < > 37.0* 34.9* 32.7* 26.4* 24.9*  MCV 99.0  --  84.3 81.5 83.2 83.0 83.0  PLT 165  --  151 207 239 228 249   < > = values in this interval not displayed.    Basic Metabolic Panel: Recent Labs  Lab 02/26/24 1824 02/27/24 0441 02/28/24 0307 02/29/24 0436 02/29/24 2016 03/01/24 0643  NA 133* 134* 134* 132*  --  133*  K 2.2* 3.2* 3.0* 3.6  --  4.3  CL 103 109 108 104  --  100  CO2 17* 15* 16* 19*  --  20*  GLUCOSE 117* 87 107* 210*  --  168*  BUN 5* 8 13 13   --  13  CREATININE <0.30* 0.30* <0.30* <0.30*  --  <0.30*  CALCIUM  8.9 8.9 8.8* 8.3*  --  8.4*  MG  --  1.6* 2.4 2.1  --  2.2  PHOS  --  2.0* 1.8* 1.4* 2.1* 2.0*   GFR: CrCl cannot be calculated (This lab value cannot be used to calculate CrCl because it is not a number: <0.30). Recent Labs  Lab 02/26/24 0437 02/26/24 0737 02/26/24 1005 02/26/24 1829 02/27/24 0441 02/28/24 0307 02/29/24 0436 03/01/24 0950  WBC  --   --    < >  --  18.8* 22.3* 15.0* 11.3*  LATICACIDVEN 0.6 2.3*  --  1.0  --   --   --   --    < > = values in this interval not displayed.    Liver Function Tests: Recent Labs  Lab 02/26/24 0726 02/26/24 1824 02/27/24 0441 02/29/24 0436 03/01/24 0643  AST 162* 106* 85* 35 31  ALT 163* 124* 108* 56* 46*  ALKPHOS 472* 373* 324* 234* 249*  BILITOT 3.8* 5.0* 6.5* 3.8* 2.3*  PROT 8.4* 7.5 7.6 6.5 6.5  ALBUMIN 3.3* 2.8* 2.7* 2.2* 2.2*   Recent Labs  Lab 02/26/24 0424  LIPASE 20   No results for input(s): AMMONIA in the last 168 hours.  ABG     Component Value Date/Time   PHART 7.52 (H) 01/09/2022 1810   PCO2ART 26 (L) 01/09/2022 1810   PO2ART 66 (L) 01/09/2022 1810   HCO3 19.2 (L) 02/26/2024 0737   TCO2 20 (L) 02/26/2024 0737   ACIDBASEDEF 3.0 (H) 02/26/2024 0737   O2SAT 94 02/26/2024 0737     Coagulation Profile: Recent Labs  Lab 02/27/24 0441  INR 1.4*    Cardiac Enzymes: No results  for input(s): CKTOTAL, CKMB, CKMBINDEX, TROPONINI in the last 168 hours.  HbA1C: Hgb A1c MFr Bld  Date/Time Value Ref Range Status  02/26/2024 06:28 PM 5.2 4.8 - 5.6 % Final    Comment:    (NOTE) Diagnosis of Diabetes The following HbA1c ranges recommended by the American Diabetes Association (ADA) may be used as an aid in the diagnosis of diabetes mellitus.  Hemoglobin             Suggested A1C NGSP%              Diagnosis  <5.7                   Non Diabetic  5.7-6.4                Pre-Diabetic  >6.4                   Diabetic  <7.0                   Glycemic control for                       adults with diabetes.    12/09/2021 11:05 AM 5.4 4.8 - 5.6 % Final    Comment:    (NOTE) Pre diabetes:          5.7%-6.4%  Diabetes:              >6.4%  Glycemic control for   <7.0% adults with diabetes     CBG: Recent Labs  Lab 02/29/24 1931 02/29/24 2307 03/01/24 0339 03/01/24 0741 03/01/24 1123  GLUCAP 221* 186* 185* 184* 153*   The patient is critically ill due to respiratory failure, ALS, ventilator dependence.  Critical care was necessary to treat or prevent imminent or life-threatening deterioration. Critical care time was spent by me on the following activities: development of a treatment plan with the patient and/or surrogate as well as nursing, discussions with consultants, evaluation of the patient's response to treatment, examination of the patient, obtaining a history from the patient or surrogate, ordering and performing treatments and interventions, ordering and review of laboratory studies,  ordering and review of radiographic studies, review of telemetry data including pulse oximetry, re-evaluation of patient's condition and participation in multidisciplinary rounds.   I personally spent 33 minutes providing critical care not including any separately billable procedures.   Belva November, MD [facility] 03/01/2024 2:10 PM

## 2024-03-01 NOTE — Plan of Care (Signed)
  Problem: Coping: Goal: Ability to adjust to condition or change in health will improve Outcome: Progressing   Problem: Fluid Volume: Goal: Ability to maintain a balanced intake and output will improve Outcome: Progressing   Problem: Health Behavior/Discharge Planning: Goal: Ability to manage health-related needs will improve Outcome: Progressing   Problem: Metabolic: Goal: Ability to maintain appropriate glucose levels will improve Outcome: Progressing   Problem: Nutritional: Goal: Maintenance of adequate nutrition will improve Outcome: Progressing   Problem: Skin Integrity: Goal: Risk for impaired skin integrity will decrease Outcome: Progressing   Problem: Tissue Perfusion: Goal: Adequacy of tissue perfusion will improve Outcome: Progressing   Problem: Clinical Measurements: Goal: Ability to maintain clinical measurements within normal limits will improve Outcome: Progressing Goal: Will remain free from infection Outcome: Progressing Goal: Diagnostic test results will improve Outcome: Progressing Goal: Respiratory complications will improve Outcome: Progressing Goal: Cardiovascular complication will be avoided Outcome: Progressing

## 2024-03-01 NOTE — Progress Notes (Addendum)
 PHARMACY - TOTAL PARENTERAL NUTRITION CONSULT NOTE   Indication: Chronic ileus  Patient Measurements: Height: 5' 5 (165.1 cm) Weight: 82.6 kg (182 lb 1.6 oz) IBW/kg (Calculated) : 61.5   Body mass index is 30.3 kg/m. Usual Weight: 77.3 kg   Assessment: Patient is a 53 yo patient with ALS, bed bound at baseline, and chronic ileus who has received TPN for 2 years. He presented with nausea and vomiting, found to have cholecystitis, s/p cholecyststomy tube insertion 8/24.   Patient is with increased WBC count and persistent fever, on antibiotics. Concern for possible central line infection due to chronic TPN. Blood cultures pending. Per discussion with CCM, risk versus benefit to start TPN since patient has chronic port that will not be removed. Authorized per Dr. Alva to utilize the port for TPN.  Has been on TPN since 2023 due to recurring prolonged ileus. Patient is having bowel movements and no ileus suspected on imaging. Per surgery, patient, and wife, would like to trial tube feeds while inpatient.   Spoke via telephone conversation with Justin, [facility] pharmacist. Patient was only receiving lipids in the TPN 2 days per week due to triglyceride intolerance. Triglycerides are stable and trending down inpatient.  8/27: Patient had large bowel movement last night. Having Nausea and abdominal bloating.  8/28: Reglan  added, nausea improved. TF off at midnight for G/J tube placement today with plans to restart after procedure. RD decreased needs based on calculations.   Glucose / Insulin : A1c 5.2, CBGs max 210, 30u rSSI   Electrolytes: Na 133, K 4.3 (after 66 meq outside of TPN yesterday), CoCa 9.8, phos 2.2 (45 mmol outside of TPN), mg 2.2, Cl 100, Co2 20 Renal: Scr <0.30 Hepatic: Tbili 2.3 (down), AST/ALT 31/46, TG 116 (down)  Intake / Output; MIVF: UOP 0.28ml/kg/min, +5.7L since admit  GI Imaging: 8/24: CT abd suspicious for Acute Cholecystitis GI Surgeries / Procedures:  8/24  cholecyststomy tube insertion  Central access: Port (PICC removed for CLABSI concern 02/28/24) TPN start date: Year 2023   RD Assessment: Estimated Needs Total Energy Estimated Needs: 1400-1600 kcal/d Total Protein Estimated Needs: 80-95 g/d Total Fluid Estimated Needs: 1.5-1.8L/d  TPN Formula Prior to Admission:  [facility]; pharmacist: Eva  Infusion Duration: 18h  Total Kcal: 1331 on lipids days; 931 on non lipid days  - Kcal calculations for TPN PTA based on previous weight in 2023   Protein: 80g Dextrose : 180g  Lipids: 40g (no lipids Tues/Wed/Thurs/Sat/Sun) Magnesium : 20 mEq Potassium: 180 mEq Phosphate: 37 mmol Acetate: 280.61 mEq Cl: 0 mEq  Current Nutrition:  Tube feeds @10ml /hr (will restart this afternoon).   Plan:  Continue TPN at 75 mL/hr with lipids at 1800 which provides 1595 calories and 100% of estimated daily needs  Will slowly increase lipids due to history of intolerance to provide 23% of kcal Electrolytes in TPN: increase Na 100 mEq/L, increase K 70 mEq/L, Ca 51mEq/L, increase Mg 15 mEq/L, and increase Phos 30 mmol/L (max for TPN). Max acetate.  Add standard MVI to TPN Add 0.5mL trace elements due to elevated T bilirubin and jaundice Continue resistant q4h SSI, will not add to TPN today since carbs are decreased in TPN with increased lipids and decreased kcal needs  Sodium phos 15 mmol IV once per CCM  Monitor TPN labs on Mon/Thurs, will get RFP tomorrow, no mag (stable)   F/u tolerance to tube feeds- increasing by 10 mL/hr every day per CCM  F/u triglycerides as patient has history of hypertriglyceridemia  with lipids in TPN F/u cycling TPN once electrolytes are stable   Lavora Brisbon M Deziya [facility] 03/01/2024,10:25 AM

## 2024-03-01 NOTE — Procedures (Signed)
 Interventional Radiology Procedure Note  Procedure: G to GJ exchange/conversion Complications: None  Estimated Blood Loss: < 10 mL  Findings: 24Fr G to GJ tube exchange with fluoro.  Jerry [name] Banner, MD

## 2024-03-02 DIAGNOSIS — A419 Sepsis, unspecified organism: Secondary | ICD-10-CM | POA: Diagnosis not present

## 2024-03-02 DIAGNOSIS — K810 Acute cholecystitis: Secondary | ICD-10-CM | POA: Diagnosis not present

## 2024-03-02 DIAGNOSIS — Z930 Tracheostomy status: Secondary | ICD-10-CM | POA: Diagnosis not present

## 2024-03-02 DIAGNOSIS — J9611 Chronic respiratory failure with hypoxia: Secondary | ICD-10-CM | POA: Diagnosis not present

## 2024-03-02 LAB — RENAL FUNCTION PANEL
Albumin: 2.2 g/dL — ABNORMAL LOW (ref 3.5–5.0)
Anion gap: 11 (ref 5–15)
BUN: 11 mg/dL (ref 6–20)
CO2: 22 mmol/L (ref 22–32)
Calcium: 8.4 mg/dL — ABNORMAL LOW (ref 8.9–10.3)
Chloride: 102 mmol/L (ref 98–111)
Creatinine, Ser: <0.3 mg/dL — ABNORMAL LOW (ref 0.61–1.24)
Glucose, Bld: 172 mg/dL — ABNORMAL HIGH (ref 70–99)
Phosphorus: 2.3 mg/dL — ABNORMAL LOW (ref 2.5–4.6)
Potassium: 3.6 mmol/L (ref 3.5–5.1)
Sodium: 135 mmol/L (ref 135–145)

## 2024-03-02 LAB — GLUCOSE, CAPILLARY
Glucose-Capillary: 149 mg/dL — ABNORMAL HIGH (ref 70–99)
Glucose-Capillary: 162 mg/dL — ABNORMAL HIGH (ref 70–99)
Glucose-Capillary: 173 mg/dL — ABNORMAL HIGH (ref 70–99)
Glucose-Capillary: 201 mg/dL — ABNORMAL HIGH (ref 70–99)
Glucose-Capillary: 185 mg/dL — ABNORMAL HIGH (ref 70–99)
Glucose-Capillary: 170 mg/dL — ABNORMAL HIGH (ref 70–99)

## 2024-03-02 LAB — ANAEROBIC CULTURE W GRAM STAIN: Gram Stain: NONE SEEN

## 2024-03-02 MED ORDER — PROSOURCE TF20 ENFIT COMPATIBL EN LIQD
60.0000 mL | Freq: Every day | ENTERAL | Status: DC
  Administered 2024-03-02 – 2024-03-12 (×11): 60 mL
  Filled 2024-03-02 (×11): qty 60

## 2024-03-02 MED ORDER — TRAVASOL 10 % IV SOLN
INTRAVENOUS | Status: AC
  Filled 2024-03-02: qty 936

## 2024-03-02 MED ORDER — OSMOLITE 1.2 CAL PO LIQD
1000.0000 mL | ORAL | Status: DC
  Administered 2024-03-02 – 2024-03-10 (×6): 1000 mL
  Filled 2024-03-02 (×14): qty 1000

## 2024-03-02 MED ORDER — POTASSIUM CHLORIDE 20 MEQ PO PACK
40.0000 meq | PACK | Freq: Once | ORAL | Status: AC
  Administered 2024-03-02: 40 meq
  Filled 2024-03-02: qty 2

## 2024-03-02 MED ORDER — SODIUM CHLORIDE 0.9 % IV SOLN
15.0000 mmol | Freq: Once | INTRAVENOUS | Status: AC
  Administered 2024-03-02: 15 mmol via INTRAVENOUS
  Filled 2024-03-02: qty 5

## 2024-03-02 NOTE — Progress Notes (Signed)
 NAME:  Jerry [name], MRN:  [id number], DOB:  [birth year], LOS: 5 ADMISSION DATE:  02/26/2024, CONSULTATION DATE: 02/22/2024 REFERRING MD:  Ruthe Cornet, , CHIEF COMPLAINT: Abdominal pain, nausea and vomiting  History of Present Illness:  53 year old male with advanced ALS, status post trach, vent dependent/PEG, on TPN due to not being able to tolerate tube feeds, who is being cared at home by his wife, was brought into the emergency department with complaint of abdominal pain, nausea and vomiting since yesterday.  Per patient's wife he was complaining of abdominal pain, had 3 episodes of nonbilious vomiting so she brought him here in the emergency department.  On workup he was noted to have possible acute cholecystitis, seen by general surgery recommend percutaneous cholecystostomy tube drainage.  PCCM was consulted for help evaluation and ventilator management Of note patient is spiking fever Tmax 101.7  Pertinent  Medical History   Past Medical History:  Diagnosis Date   ALS (amyotrophic lateral sclerosis) (HCC)    COVID-19 virus infection 06/2020   Meningitis 2003   spinal   Morbid obesity (HCC)    Neuromuscular disorder (HCC)    Prediabetes 11/05/2016   A1C 5.7 on 11/05/16   Type 2 diabetes mellitus (HCC) 08/31/2023    Significant Hospital Events: Including procedures, antibiotic start and stop dates in addition to other pertinent events   8/24 perc cholecystostomy by IR 8/26 persistent fever and leukocytosis 8/26 trickle tube feeds started 8/28 NPO for GJ tube exchanged. On TPN  Interim History / Subjective:   G-tube exchanged for GJ tube 8/28 Home Vent 500 x 16, 0.32+ PEEP 5 No pressors I/O+ 7.4 L total, urine output 1450 cc last 24 hours Percutaneous cholecystostomy 30 cc last 24 hours  Objective    Blood pressure 97/61, pulse (!) 102, temperature 98.9 F (37.2 C), temperature source Oral, resp. rate 16, height 5' 5 (1.651 m), weight 82.6 kg, SpO2 100%.    Vent Mode:  AC;Other (Comment) FiO2 (%):  [32 %-38 %] 32 % Set Rate:  [16 bmp] 16 bmp Vt Set:  [500 mL] 500 mL PEEP:  [5 cmH20] 5 cmH20 Plateau Pressure:  [18 cmH20-19 cmH20] 18 cmH20   Intake/Output Summary (Last 24 hours) at 03/02/2024 0736 Last data filed at 03/02/2024 0600 Gross per 24 hour  Intake 1972.39 ml  Output 1480 ml  Net 492.39 ml   Filed Weights   02/27/24 0500 02/28/24 0310 03/01/24 0114  Weight: 77.3 kg 82.5 kg 82.6 kg  Gen: Ill-appearing man, ventilated HEENT: Pupils equal.  Oral secretions Neck: Trach in place Chest: Coarse bilateral breath sounds, ventilated CV: Regular, no murmur, normal pulses Abd: Mild distention and tympany.  Soft.  Positive bowel sounds, feeding tube in place Ext: No significant edema Neuro: Open eyes to voice, communicated with eyes.  He has an ocular tracking device   Assessment and Plan   Sepsis due to Acute cholecystitis s/p Perc cholecystostomy tube placement by IR - Remains on Zosyn , vancomycin  discontinued - Following blood cultures, remain negative --Following cholecystostomy tube output  - Due to rising WBC and fevers ,remain vigilant for bacteremia from other causes such as PICC line - PICC line DC'd 8/26 - Follow blood cultures - Biliary tube with minimal drainage, surgery following , cholangiogram 8/27 shows good position - Continue Zosyn , ok to DC vancomycin   Chronic hypoxic respiratory failure status post trach, vent dependent Advanced ALS Respiratory culture > Achromobacter, Pseudomonas, stenotrophomonas, MSSA - On home ventilator - Routine trach care - VAP prevention  orders in place - Respiratory culture likely reflects chronic colonization.  No infiltrates to support active pneumonia (would be covered by Zosyn  in any event)   Dysphagia status post PEG, not tolerating tube feeds, currently on TPN Severe protein calorie malnutrition - G-tube exchanged for GJ tube to facilitate postpyloric feeding on 8/28 - Trickle TF  initiated, will advance as tolerated.  If we can get to goal then consider coming off TPN - May be able to consider promotility meds   Wife and patient updated at the bedside, all questions were answered  Labs   CBC: Recent Labs  Lab 02/26/24 0424 02/26/24 0737 02/26/24 1005 02/27/24 0441 02/28/24 0307 02/29/24 0436 03/01/24 0950  WBC 8.0  --  10.4 18.8* 22.3* 15.0* 11.3*  NEUTROABS 6.6  --  8.2*  --  19.5* 13.1* 8.7*  HGB 8.6*   < > 11.9* 11.5* 10.7* 8.6* 8.2*  HCT 31.0*   < > 37.0* 34.9* 32.7* 26.4* 24.9*  MCV 99.0  --  84.3 81.5 83.2 83.0 83.0  PLT 165  --  151 207 239 228 249   < > = values in this interval not displayed.    Basic Metabolic Panel: Recent Labs  Lab 02/26/24 1824 02/27/24 0441 02/28/24 0307 02/29/24 0436 02/29/24 2016 03/01/24 0643  NA 133* 134* 134* 132*  --  133*  K 2.2* 3.2* 3.0* 3.6  --  4.3  CL 103 109 108 104  --  100  CO2 17* 15* 16* 19*  --  20*  GLUCOSE 117* 87 107* 210*  --  168*  BUN 5* 8 13 13   --  13  CREATININE <0.30* 0.30* <0.30* <0.30*  --  <0.30*  CALCIUM  8.9 8.9 8.8* 8.3*  --  8.4*  MG  --  1.6* 2.4 2.1  --  2.2  PHOS  --  2.0* 1.8* 1.4* 2.1* 2.0*   GFR: CrCl cannot be calculated (This lab value cannot be used to calculate CrCl because it is not a number: <0.30). Recent Labs  Lab 02/26/24 0437 02/26/24 0737 02/26/24 1005 02/26/24 1829 02/27/24 0441 02/28/24 0307 02/29/24 0436 03/01/24 0950  WBC  --   --    < >  --  18.8* 22.3* 15.0* 11.3*  LATICACIDVEN 0.6 2.3*  --  1.0  --   --   --   --    < > = values in this interval not displayed.    Liver Function Tests: Recent Labs  Lab 02/26/24 0726 02/26/24 1824 02/27/24 0441 02/29/24 0436 03/01/24 0643  AST 162* 106* 85* 35 31  ALT 163* 124* 108* 56* 46*  ALKPHOS 472* 373* 324* 234* 249*  BILITOT 3.8* 5.0* 6.5* 3.8* 2.3*  PROT 8.4* 7.5 7.6 6.5 6.5  ALBUMIN 3.3* 2.8* 2.7* 2.2* 2.2*   Recent Labs  Lab 02/26/24 0424  LIPASE 20   No results for input(s):  AMMONIA in the last 168 hours.  ABG    Component Value Date/Time   PHART 7.52 (H) 01/09/2022 1810   PCO2ART 26 (L) 01/09/2022 1810   PO2ART 66 (L) 01/09/2022 1810   HCO3 19.2 (L) 02/26/2024 0737   TCO2 20 (L) 02/26/2024 0737   ACIDBASEDEF 3.0 (H) 02/26/2024 0737   O2SAT 94 02/26/2024 0737     Coagulation Profile: Recent Labs  Lab 02/27/24 0441  INR 1.4*    Cardiac Enzymes: No results for input(s): CKTOTAL, CKMB, CKMBINDEX, TROPONINI in the last 168 hours.  HbA1C: Hgb A1c MFr Bld  Date/Time  Value Ref Range Status  02/26/2024 06:28 PM 5.2 4.8 - 5.6 % Final    Comment:    (NOTE) Diagnosis of Diabetes The following HbA1c ranges recommended by the American Diabetes Association (ADA) may be used as an aid in the diagnosis of diabetes mellitus.  Hemoglobin             Suggested A1C NGSP%              Diagnosis  <5.7                   Non Diabetic  5.7-6.4                Pre-Diabetic  >6.4                   Diabetic  <7.0                   Glycemic control for                       adults with diabetes.    12/09/2021 11:05 AM 5.4 4.8 - 5.6 % Final    Comment:    (NOTE) Pre diabetes:          5.7%-6.4%  Diabetes:              >6.4%  Glycemic control for   <7.0% adults with diabetes     CBG: Recent Labs  Lab 03/01/24 1123 03/01/24 1516 03/01/24 1940 03/01/24 2312 03/02/24 0341  GLUCAP 153* 103* 162* 193* 149*    Independent critical care time 32 minutes   Lamar Chris, MD, PhD 03/02/2024, 7:49 AM [facility] [phone] or if no answer before 7:00PM call [phone] For any issues after 7:00PM please call eLink [phone]

## 2024-03-02 NOTE — Progress Notes (Signed)
 Nutrition Follow-up  DOCUMENTATION CODES:  Not applicable  INTERVENTION:  Recommend the following via G-J tube: Osmolite 1.2 at 50 ml/h (1200 ml per day)  When able to advance, increase to 20 and advance by 10mL every 24 hours to reach goal Prosource TF20 60 ml 1x/d Provides 1520 kcal, 87 gm protein, 984 ml free water  daily Continue TPN - management per pharmacy. If pt able to tolerate, may be able to transition off For home use, Nestle equivalent is Isosource HN Would recommend increasing rate to 49mL/h (1339mL/d) to account for loss of additional nutrition from prosource.  NUTRITION DIAGNOSIS:  Inadequate oral intake related to inability to eat, altered GI function as evidenced by NPO status. - remains applicable   GOAL:  Patient will meet greater than or equal to 90% of their needs - progressing, being met with TPN  MONITOR:  Labs, Weight trends, I & O's  REASON FOR ASSESSMENT:  Consult Enteral/tube feeding initiation and management  ASSESSMENT:  53 year old male who presented to the ED on 02/26/24 with abdominal pain, distention, and emesis. PMH of advanced ALS, chronic hypoxic respiratory failure s/p trach, vent dependence, G-tube in place, TPN dependence. Pt admitted with sepsis due to acute cholecystitis.  8/24 - s/p cholecystostomy catheter placement by IR  8/26 - trickle tube feeds started 8/28 - NPO for GJ tube exchanged. On TPN  Pt resting in bed at the time of assessment, awake and alert. Wife at bedside also assists with hx. Pt reports that so far TF are going well via the J-port. Is having a little bit of gas, but otherwise feels well.   Discussed plan to slowly continue increasing TF to goal to monitor for tolerance. All questions answered.  MV: 8 L/min Temp (24hrs), Avg:99.3 F (37.4 C), Min:98.7 F (37.1 C), Max:99.8 F (37.7 C) MAP (cuff): 77 mmHg  Admit weight: 76.9 kg  Current weight: 82.5 kg    Intake/Output Summary (Last 24 hours) at 03/02/2024  1453 Last data filed at 03/02/2024 1000 Gross per 24 hour  Intake 1844.13 ml  Output 935 ml  Net 909.13 ml  Net IO Since Admission: 7,889.01 mL [03/02/24 1453]  Drains/Lines: Implanted port PEG-J, 24 Fr. LUQ JP drain RLQ 30mL x 24 hours UOP x 24 hours  Nutritionally Relevant Medications: Scheduled Meds:  docusate  100 mg Per Tube BID   PROSource TF20  60 mL Per Tube Daily   glycopyrrolate   0.2 mg Intravenous BID   insulin  aspart  0-20 Units Subcutaneous Q4H   metoCLOPramide  (REGLAN )  10 mg Intravenous Q8H   polyethylene glycol  17 g Per Tube Daily   potassium chloride   40 mEq Per Tube Once   Continuous Infusions:  feeding supplement (OSMOLITE 1.2 CAL) 1,000 mL (03/02/24 0850)   piperacillin -tazobactam (ZOSYN )  IV Stopped (03/02/24 0603)   potassium PHOSPHATE  IVPB (in mmol)     TPN ADULT (ION) 75 mL/hr at 03/02/24 0800   TPN ADULT (ION)     PRN Meds: docusate, ondansetron , polyethylene glycol, simethicone   Labs Reviewed: Creatinine <0.30 Phosphorus 2.3 CBG ranges from 103-193 mg/dL over the last 24 hours HgbA1c 5.2  NUTRITION - FOCUSED PHYSICAL EXAM: Flowsheet Row Most Recent Value  Orbital Region No depletion  Upper Arm Region No depletion  Thoracic and Lumbar Region No depletion  Buccal Region No depletion  Temple Region Mild depletion  Clavicle Bone Region No depletion  Clavicle and Acromion Bone Region Mild depletion  Scapular Bone Region Unable to assess  Dorsal Hand  No depletion  Patellar Region Unable to assess  Anterior Thigh Region Unable to assess  Posterior Calf Region Unable to assess  Edema (RD Assessment) None  Hair Reviewed  Eyes Unable to assess  Mouth Unable to assess  Skin Reviewed  Nails Reviewed    Diet Order:   Diet Order             Diet NPO time specified  Diet effective midnight                   EDUCATION NEEDS:  Not appropriate for education at this time  Skin:  Skin Assessment: Reviewed RN Assessment  Last  BM:  8/27  Height:  Ht Readings from Last 1 Encounters:  03/02/24 5' 5 (1.651 m)    Weight:  Wt Readings from Last 1 Encounters:  03/01/24 82.6 kg    Ideal Body Weight:  55.6 kg (adjusted by 10% for quadriplegia)  BMI:  Body mass index is 30.3 kg/m.  Estimated Nutritional Needs:  Kcal:  1400-1600 kcal/d Protein:  80-95 g/d Fluid:  1.5-1.8L/d    Vernell Lukes, RD, LDN, CNSC Registered Dietitian II Please reach out via secure chat

## 2024-03-02 NOTE — Plan of Care (Signed)
  Problem: Health Behavior/Discharge Planning: Goal: Ability to identify and utilize available resources and services will improve Outcome: Progressing Goal: Ability to manage health-related needs will improve Outcome: Progressing   Problem: Metabolic: Goal: Ability to maintain appropriate glucose levels will improve Outcome: Progressing   Problem: Nutritional: Goal: Maintenance of adequate nutrition will improve Outcome: Progressing Goal: Progress toward achieving an optimal weight will improve Outcome: Progressing   Problem: Skin Integrity: Goal: Risk for impaired skin integrity will decrease Outcome: Progressing   Problem: Tissue Perfusion: Goal: Adequacy of tissue perfusion will improve Outcome: Progressing

## 2024-03-02 NOTE — Progress Notes (Signed)
 PHARMACY - TOTAL PARENTERAL NUTRITION CONSULT NOTE   Indication: Chronic ileus  Patient Measurements: Height: 5' 5 (165.1 cm) Weight: 82.6 kg (182 lb 1.6 oz) IBW/kg (Calculated) : 61.5   Body mass index is 30.3 kg/m. Usual Weight: 77.3 kg   Assessment: Patient is a 53 yo patient with ALS, bed bound at baseline, and chronic ileus who has received TPN for 2 years. He presented with nausea and vomiting, found to have cholecystitis, s/p cholecyststomy tube insertion 8/24.   Patient is with increased WBC count and persistent fever, on antibiotics. Concern for possible central line infection due to chronic TPN. Blood cultures pending. Per discussion with CCM, risk versus benefit to start TPN since patient has chronic port that will not be removed. Authorized per Dr. Alva to utilize the port for TPN.  Has been on TPN since 2023 due to recurring prolonged ileus. Patient is having bowel movements and no ileus suspected on imaging. Per surgery, patient, and wife, would like to trial tube feeds while inpatient.   Spoke via telephone conversation with Justin, [facility] pharmacist. Patient was only receiving lipids in the TPN 2 days per week due to triglyceride intolerance. Triglycerides are stable and trending down inpatient.  8/27: Patient had large bowel movement last night. Having Nausea and abdominal bloating.  8/28: Reglan  added, nausea improved. TF off at midnight for G/J tube placement today with plans to restart after procedure. RD decreased needs based on calculations.   Glucose / Insulin : A1c 5.2, CBGs improving (149-193), 19u rSSI   Electrolytes: Na 135, K down 3.6 (s/p 20 meq outside of TPN yesterday), CoCa 9.8, phos 2.3 (s/p 15 mmol outside of TPN), mg 2.2, Cl 100, Co2 20 Renal: Scr <0.30 Hepatic: Tbili 2.3 (down), AST/ALT 31/46, TG 116 (down)  Intake / Output; MIVF: UOP 0.38ml/kg/min, +7L since admit ; Reglan  10 q8h; LBM 8/27 GI Imaging: 8/24: CT abd suspicious for Acute  Cholecystitis GI Surgeries / Procedures:  8/24 cholecyststomy tube insertion  Central access: Port (PICC removed for CLABSI concern 02/28/24) TPN start date: Year 2023   RD Assessment: Estimated Needs Total Energy Estimated Needs: 1400-1600 kcal/d Total Protein Estimated Needs: 80-95 g/d Total Fluid Estimated Needs: 1.5-1.8L/d  TPN Formula Prior to Admission:  [facility]; pharmacist: Eva  Infusion Duration: 18h  Total Kcal: 1331 on lipids days; 931 on non lipid days  - Kcal calculations for TPN PTA based on previous weight in 2023   Protein: 80g Dextrose : 180g  Lipids: 40g (no lipids Tues/Wed/Thurs/Sat/Sun) Magnesium : 20 mEq Potassium: 180 mEq Phosphate: 37 mmol Acetate: 280.61 mEq Cl: 0 mEq  Current Nutrition:  Tube feeds @20  ml/hr -starting to advance today Q12 hrs TPN   Plan:  Continue TPN at 75 mL/hr with lipids at 1800 which provides 94g protein and 1517 calories and 100% of estimated daily needs: adjusted down protein, further down on dextrose , and slightly up on lipids to meet goals.  Electrolytes in TPN: increase Na 100 mEq/L, K 70 mEq/L (total 152mEq/bag), Ca 71mEq/L, Mg 15 mEq/L (max for TPN), and Phos 30 mmol/L (total 54 mmol/bag; max for TPN). Max acetate.  Add standard MVI to TPN Add 0.5mL trace elements due to elevated T bilirubin and jaundice Continue resistant q4h SSI, will not add to TPN today since carbs are decreased and starting TFs May need tube feed coverage Kphos 15 mmol IV x1 + KCl 40 mEq per tube x1 Monitor TPN labs on Mon/Thurs, will get RFP tomorrow, no mag (stable)   F/u  tolerance to tube feeds advancement and ability to stop TPN F/u triglycerides as patient has history of hypertriglyceridemia with lipids in TPN  Harlene Boga, PharmD, BCPS, BCCCP Clinical Pharmacist Please refer to [facility] for [facility] numbers 03/02/2024,7:47 AM

## 2024-03-03 ENCOUNTER — Inpatient Hospital Stay (HOSPITAL_COMMUNITY)

## 2024-03-03 DIAGNOSIS — J9611 Chronic respiratory failure with hypoxia: Secondary | ICD-10-CM | POA: Diagnosis not present

## 2024-03-03 DIAGNOSIS — K810 Acute cholecystitis: Secondary | ICD-10-CM | POA: Diagnosis not present

## 2024-03-03 DIAGNOSIS — A419 Sepsis, unspecified organism: Secondary | ICD-10-CM | POA: Diagnosis not present

## 2024-03-03 DIAGNOSIS — Z930 Tracheostomy status: Secondary | ICD-10-CM | POA: Diagnosis not present

## 2024-03-03 LAB — GLUCOSE, CAPILLARY
Glucose-Capillary: 186 mg/dL — ABNORMAL HIGH (ref 70–99)
Glucose-Capillary: 217 mg/dL — ABNORMAL HIGH (ref 70–99)
Glucose-Capillary: 229 mg/dL — ABNORMAL HIGH (ref 70–99)
Glucose-Capillary: 222 mg/dL — ABNORMAL HIGH (ref 70–99)
Glucose-Capillary: 186 mg/dL — ABNORMAL HIGH (ref 70–99)
Glucose-Capillary: 178 mg/dL — ABNORMAL HIGH (ref 70–99)

## 2024-03-03 LAB — CULTURE, BLOOD (ROUTINE X 2)
Culture: NO GROWTH
Culture: NO GROWTH
Special Requests: ADEQUATE
Special Requests: ADEQUATE

## 2024-03-03 LAB — BASIC METABOLIC PANEL WITH GFR
Anion gap: 9 (ref 5–15)
BUN: 10 mg/dL (ref 6–20)
CO2: 22 mmol/L (ref 22–32)
Calcium: 8.5 mg/dL — ABNORMAL LOW (ref 8.9–10.3)
Chloride: 104 mmol/L (ref 98–111)
Creatinine, Ser: <0.3 mg/dL — ABNORMAL LOW (ref 0.61–1.24)
Glucose, Bld: 196 mg/dL — ABNORMAL HIGH (ref 70–99)
Potassium: 4 mmol/L (ref 3.5–5.1)
Sodium: 135 mmol/L (ref 135–145)

## 2024-03-03 LAB — PHOSPHORUS: Phosphorus: 2.3 mg/dL — ABNORMAL LOW (ref 2.5–4.6)

## 2024-03-03 LAB — MAGNESIUM: Magnesium: 2.1 mg/dL (ref 1.7–2.4)

## 2024-03-03 LAB — TRIGLYCERIDES: Triglycerides: 132 mg/dL (ref <150)

## 2024-03-03 MED ORDER — TRAVASOL 10 % IV SOLN
INTRAVENOUS | Status: AC
  Filled 2024-03-03: qty 936

## 2024-03-03 MED ORDER — SODIUM CHLORIDE 0.9 % IV SOLN
30.0000 mmol | Freq: Once | INTRAVENOUS | Status: AC
  Administered 2024-03-03: 30 mmol via INTRAVENOUS
  Filled 2024-03-03: qty 10

## 2024-03-03 MED ORDER — INSULIN ASPART 100 UNIT/ML IJ SOLN
3.0000 [IU] | INTRAMUSCULAR | Status: DC
  Administered 2024-03-03 – 2024-03-05 (×8): 3 [IU] via SUBCUTANEOUS

## 2024-03-03 MED ORDER — IPRATROPIUM-ALBUTEROL 0.5-2.5 (3) MG/3ML IN SOLN
3.0000 mL | Freq: Two times a day (BID) | RESPIRATORY_TRACT | Status: DC
  Administered 2024-03-03 – 2024-03-12 (×18): 3 mL via RESPIRATORY_TRACT
  Filled 2024-03-03 (×19): qty 3

## 2024-03-03 MED ORDER — POTASSIUM PHOSPHATES 15 MMOLE/5ML IV SOLN
30.0000 mmol | Freq: Once | INTRAVENOUS | Status: DC
  Filled 2024-03-03: qty 10

## 2024-03-03 NOTE — Progress Notes (Signed)
 PHARMACY - TOTAL PARENTERAL NUTRITION CONSULT NOTE  Indication: Chronic ileus  Patient Measurements: Height: 5' 5 (165.1 cm) Weight: 80.6 kg (177 lb 11.1 oz) IBW/kg (Calculated) : 61.5   Body mass index is 29.57 kg/m. Usual Weight: 77.3 kg   Assessment:  53 yo patient with ALS, bed bound at baseline, and chronic ileus who has received TPN for 2 years. He presented with nausea and vomiting, found to have cholecystitis, s/p cholecyststomy tube insertion 8/24.  Patient is having bowel movements and no ileus suspected on imaging. Per surgery, patient, and wife, would like to trial tube feeds while inpatient.   Concern for possible central line infection due to chronic TPN. Blood cultures pending. Per discussion with CCM, risk versus benefit to resume TPN since patient has chronic port that will not be removed. Authorized per Dr. Alva to utilize the port for TPN.  Glucose / Insulin : A1c 5.2 - CBGs elevated Used 26 units SSI in past 24 hrs Electrolytes: all WNL except Phos at 2.3 (Na low normal) Renal: SCr < 1, BUN WNL Hepatic: LFTs improving, tbili down to 2.3 (jaundice), TG normalized Intake / Output; MIVF: UOP 0.67ml/kg/min, +7L since admit ; Reglan  10 q8h; LBM 8/27 GI Imaging: 8/24: CT abd suspicious for Acute Cholecystitis GI Surgeries / Procedures:  8/24 cholecyststomy tube insertion Central access: port (PICC removed for CLABSI concern 02/28/24) TPN start date: Year 2023   RD Estimated Needs Total Energy Estimated Needs: 1400-1600 kcal/d Total Protein Estimated Needs: 80-95 g/d Total Fluid Estimated Needs: 1.5-1.8L/d *RD reduced on 8/27  TPN Formula PTA:   [facility] pharmacist: Eva [phone]  Infusion Duration: 18hr Total Kcal: 1331 on lipids days; 931 on non lipid days  Kcal calculations for TPN PTA based on previous weight in 2023   Protein: 80g Dextrose : 180g  Lipids: 40g (no lipids Tues/Wed/Thurs/Sat/Sun d/t elevated TG) Magnesium : 20 mEq Potassium: 180  mEq Phosphate: 37 mmol Acetate: 280.61 mEq Cl: 0 mEq  Current Nutrition:  Osmolite 1.2 at 30 ml/hr (advance 10 ml/hr Q12H to goal rate 50 ml/hr) Prosource 60mL daily TPN   Plan:  Adjust TPN to remove free water  TPN at 65 mL/hr will provide 94g AA, 187g CHO, 39g ILE and 1401 kCal, meeting 100% of needs Electrolytes in TPN: Na 100 mEq/L, K 80 mEq/L (= 125 mEq/day), Ca 67mEq/L, Mg 15 mEq/L (max for TPN), Phos 30 mmol/L (= 47 mmol/day; max in TPN), max acetate.  Add standard MVI and 1/2 trace element in TPN Continue resistant SSI Q4H and add 3 units TF coverage KPhos 30mmol IV in NS  Monitor TPN labs on Mon/Thurs - labs in AM F/u TF advancement to stop TPN in AM  Jerry [name] D. Lendell, PharmD, BCPS, BCCCP 03/03/2024, 12:18 PM

## 2024-03-03 NOTE — Progress Notes (Signed)
 NAME:  Jerry [name], MRN:  [id number], DOB:  1971/02/03, LOS: 6 ADMISSION DATE:  02/26/2024, CONSULTATION DATE: 02/22/2024 REFERRING MD:  Ruthe Cornet, , CHIEF COMPLAINT: Abdominal pain, nausea and vomiting  History of Present Illness:  53 year old male with advanced ALS, status post trach, vent dependent/PEG, on TPN due to not being able to tolerate tube feeds, who is being cared at home by his wife, was brought into the emergency department with complaint of abdominal pain, nausea and vomiting since yesterday.  Per patient's wife he was complaining of abdominal pain, had 3 episodes of nonbilious vomiting so she brought him here in the emergency department.  On workup he was noted to have possible acute cholecystitis, seen by general surgery recommend percutaneous cholecystostomy tube drainage.  PCCM was consulted for help evaluation and ventilator management Of note patient is spiking fever Tmax 101.7  Pertinent  Medical History   Past Medical History:  Diagnosis Date   ALS (amyotrophic lateral sclerosis) (HCC)    COVID-19 virus infection 06/2020   Meningitis 2003   spinal   Morbid obesity (HCC)    Neuromuscular disorder (HCC)    Prediabetes 11/05/2016   A1C 5.7 on 11/05/16   Type 2 diabetes mellitus (HCC) 08/31/2023    Significant Hospital Events: Including procedures, antibiotic start and stop dates in addition to other pertinent events   8/24 perc cholecystostomy by IR 8/26 persistent fever and leukocytosis 8/26 trickle tube feeds started 8/28 NPO for GJ tube exchanged. On TPN  Interim History / Subjective:  Remains on tube feeds >> 40 cc/h (goal 50) Home ventilator 500 x 16, 0.32+ PEEP 5 PERC drain output increased last 24 hours 560 cc I/O+ 8.7 L total   Objective    Blood pressure 127/69, pulse (!) 106, temperature 99.2 F (37.3 C), temperature source Oral, resp. rate 16, height 5' 5 (1.651 m), weight 80.6 kg, SpO2 100%.    Vent Mode: AC FiO2 (%):  [32 %] 32 % Set  Rate:  [16 bmp] 16 bmp Vt Set:  [500 mL] 500 mL PEEP:  [5 cmH20] 5 cmH20 Plateau Pressure:  [21 cmH20-25 cmH20] 23 cmH20   Intake/Output Summary (Last 24 hours) at 03/03/2024 0744 Last data filed at 03/03/2024 0700 Gross per 24 hour  Intake 2911.62 ml  Output 1685 ml  Net 1226.62 ml   Filed Weights   02/28/24 0310 03/01/24 0114 03/03/24 0500  Weight: 82.5 kg 82.6 kg 80.6 kg  Gen: Chronically ill-appearing but in no distress HEENT: He has oral secretions, pupils equal Neck: Trach in place, stoma clean Chest: Coarse bilaterally.  On his home ventilator CV: Regular, no murmur Abd: Slightly distended, somewhat tympanitic, positive bowel sounds, gallbladder drain in place with bilious fluid Ext: No significant lower extremity edema Neuro: Eyes are open, communicating with his eyes.  No other movement   Assessment and Plan   Sepsis due to Acute cholecystitis s/p Perc cholecystostomy tube placement by IR -Remains on Zosyn , duration not clear, consider 7-10 days depending on stability and duration of percutaneous cholecystostomy - Following cholecystostomy tube output - Following cultures, all remain negative with exception of respiratory culture (suspect colonizers)  Chronic hypoxic respiratory failure status post trach, vent dependent Advanced ALS Respiratory culture > Achromobacter, Pseudomonas, stenotrophomonas, MSSA -Tolerating home vent at baseline settings - Routine trach care - Respiratory culture reflects likely colonization: Achromobacter, Pseudomonas, stenotrophomonas, MSSA.  No clear evidence for bronchitis or pneumonia  Dysphagia status post PEG, not tolerating tube feeds, currently on TPN Severe protein  calorie malnutrition -Goal advance TF now that he has a GJ tube in place to facilitate postpyloric feeding - If able to advance TF to goal then can consider coming off TPN - Could consider promotility medication  Hypophosphatemia - Replete electrolytes as  indicated  Discussed his status and plans with his wife on 8/29  Labs   CBC: Recent Labs  Lab 02/26/24 0424 02/26/24 0737 02/26/24 1005 02/27/24 0441 02/28/24 0307 02/29/24 0436 03/01/24 0950  WBC 8.0  --  10.4 18.8* 22.3* 15.0* 11.3*  NEUTROABS 6.6  --  8.2*  --  19.5* 13.1* 8.7*  HGB 8.6*   < > 11.9* 11.5* 10.7* 8.6* 8.2*  HCT 31.0*   < > 37.0* 34.9* 32.7* 26.4* 24.9*  MCV 99.0  --  84.3 81.5 83.2 83.0 83.0  PLT 165  --  151 207 239 228 249   < > = values in this interval not displayed.    Basic Metabolic Panel: Recent Labs  Lab 02/27/24 0441 02/28/24 0307 02/29/24 0436 02/29/24 2016 03/01/24 0643 03/02/24 0748 03/03/24 0341  NA 134* 134* 132*  --  133* 135 135  K 3.2* 3.0* 3.6  --  4.3 3.6 4.0  CL 109 108 104  --  100 102 104  CO2 15* 16* 19*  --  20* 22 22  GLUCOSE 87 107* 210*  --  168* 172* 196*  BUN 8 13 13   --  13 11 10   CREATININE 0.30* <0.30* <0.30*  --  <0.30* <0.30* <0.30*  CALCIUM  8.9 8.8* 8.3*  --  8.4* 8.4* 8.5*  MG 1.6* 2.4 2.1  --  2.2  --  2.1  PHOS 2.0* 1.8* 1.4* 2.1* 2.0* 2.3* 2.3*   GFR: CrCl cannot be calculated (This lab value cannot be used to calculate CrCl because it is not a number: <0.30). Recent Labs  Lab 02/26/24 0437 02/26/24 0737 02/26/24 1005 02/26/24 1829 02/27/24 0441 02/28/24 0307 02/29/24 0436 03/01/24 0950  WBC  --   --    < >  --  18.8* 22.3* 15.0* 11.3*  LATICACIDVEN 0.6 2.3*  --  1.0  --   --   --   --    < > = values in this interval not displayed.    Liver Function Tests: Recent Labs  Lab 02/26/24 0726 02/26/24 1824 02/27/24 0441 02/29/24 0436 03/01/24 0643 03/02/24 0748  AST 162* 106* 85* 35 31  --   ALT 163* 124* 108* 56* 46*  --   ALKPHOS 472* 373* 324* 234* 249*  --   BILITOT 3.8* 5.0* 6.5* 3.8* 2.3*  --   PROT 8.4* 7.5 7.6 6.5 6.5  --   ALBUMIN 3.3* 2.8* 2.7* 2.2* 2.2* 2.2*   Recent Labs  Lab 02/26/24 0424  LIPASE 20   No results for input(s): AMMONIA in the last 168 hours.  ABG     Component Value Date/Time   PHART 7.52 (H) 01/09/2022 1810   PCO2ART 26 (L) 01/09/2022 1810   PO2ART 66 (L) 01/09/2022 1810   HCO3 19.2 (L) 02/26/2024 0737   TCO2 20 (L) 02/26/2024 0737   ACIDBASEDEF 3.0 (H) 02/26/2024 0737   O2SAT 94 02/26/2024 0737     Coagulation Profile: Recent Labs  Lab 02/27/24 0441  INR 1.4*    Cardiac Enzymes: No results for input(s): CKTOTAL, CKMB, CKMBINDEX, TROPONINI in the last 168 hours.  HbA1C: Hgb A1c MFr Bld  Date/Time Value Ref Range Status  02/26/2024 06:28 PM 5.2 4.8 - 5.6 %  Final    Comment:    (NOTE) Diagnosis of Diabetes The following HbA1c ranges recommended by the American Diabetes Association (ADA) may be used as an aid in the diagnosis of diabetes mellitus.  Hemoglobin             Suggested A1C NGSP%              Diagnosis  <5.7                   Non Diabetic  5.7-6.4                Pre-Diabetic  >6.4                   Diabetic  <7.0                   Glycemic control for                       adults with diabetes.    12/09/2021 11:05 AM 5.4 4.8 - 5.6 % Final    Comment:    (NOTE) Pre diabetes:          5.7%-6.4%  Diabetes:              >6.4%  Glycemic control for   <7.0% adults with diabetes     CBG: Recent Labs  Lab 03/02/24 1501 03/02/24 1941 03/02/24 2308 03/03/24 0308 03/03/24 0727  GLUCAP 201* 185* 170* 186* 217*       Lamar Chris, MD, PhD 03/03/2024, 7:44 AM [facility] [phone] or if no answer before 7:00PM call [phone] For any issues after 7:00PM please call eLink [phone]

## 2024-03-03 NOTE — Plan of Care (Signed)

## 2024-03-03 NOTE — Progress Notes (Signed)
 03/03/2024 Abd distension with trial of TF.  G to suction.  Diffuse ileus on KUB.  Will hold further tonight.  Consider trickles to J tube with G to suction + subQ neostigmine + reglan ?  Will d/w [place] tomorrow.  Rolan Sharps MD PCCM

## 2024-03-04 DIAGNOSIS — Z930 Tracheostomy status: Secondary | ICD-10-CM | POA: Diagnosis not present

## 2024-03-04 DIAGNOSIS — J9611 Chronic respiratory failure with hypoxia: Secondary | ICD-10-CM | POA: Diagnosis not present

## 2024-03-04 DIAGNOSIS — K810 Acute cholecystitis: Secondary | ICD-10-CM | POA: Diagnosis not present

## 2024-03-04 DIAGNOSIS — A419 Sepsis, unspecified organism: Secondary | ICD-10-CM | POA: Diagnosis not present

## 2024-03-04 LAB — BASIC METABOLIC PANEL WITH GFR
Anion gap: 9 (ref 5–15)
BUN: 10 mg/dL (ref 6–20)
CO2: 22 mmol/L (ref 22–32)
Calcium: 8.9 mg/dL (ref 8.9–10.3)
Chloride: 107 mmol/L (ref 98–111)
Creatinine, Ser: <0.3 mg/dL — ABNORMAL LOW (ref 0.61–1.24)
Glucose, Bld: 183 mg/dL — ABNORMAL HIGH (ref 70–99)
Potassium: 3.7 mmol/L (ref 3.5–5.1)
Sodium: 138 mmol/L (ref 135–145)

## 2024-03-04 LAB — GLUCOSE, CAPILLARY
Glucose-Capillary: 178 mg/dL — ABNORMAL HIGH (ref 70–99)
Glucose-Capillary: 155 mg/dL — ABNORMAL HIGH (ref 70–99)
Glucose-Capillary: 189 mg/dL — ABNORMAL HIGH (ref 70–99)
Glucose-Capillary: 161 mg/dL — ABNORMAL HIGH (ref 70–99)
Glucose-Capillary: 137 mg/dL — ABNORMAL HIGH (ref 70–99)
Glucose-Capillary: 143 mg/dL — ABNORMAL HIGH (ref 70–99)

## 2024-03-04 LAB — PHOSPHORUS: Phosphorus: 2.7 mg/dL (ref 2.5–4.6)

## 2024-03-04 MED ORDER — POTASSIUM CHLORIDE 10 MEQ/50ML IV SOLN
10.0000 meq | INTRAVENOUS | Status: AC
  Administered 2024-03-04 (×2): 10 meq via INTRAVENOUS
  Filled 2024-03-04 (×2): qty 50

## 2024-03-04 MED ORDER — PYRIDOSTIGMINE BROMIDE 60 MG/5ML PO SOLN
30.0000 mg | Freq: Two times a day (BID) | ORAL | Status: DC
  Administered 2024-03-04 – 2024-03-11 (×15): 30 mg
  Filled 2024-03-04 (×15): qty 2.5

## 2024-03-04 MED ORDER — SODIUM CHLORIDE 0.9 % IV SOLN
15.0000 mmol | Freq: Once | INTRAVENOUS | Status: AC
  Administered 2024-03-04: 15 mmol via INTRAVENOUS
  Filled 2024-03-04: qty 5

## 2024-03-04 MED ORDER — METOCLOPRAMIDE HCL 5 MG/5ML PO SOLN
10.0000 mg | Freq: Three times a day (TID) | ORAL | Status: DC
  Administered 2024-03-04 – 2024-03-12 (×26): 10 mg
  Filled 2024-03-04 (×27): qty 10

## 2024-03-04 MED ORDER — TRAVASOL 10 % IV SOLN
INTRAVENOUS | Status: AC
  Filled 2024-03-04: qty 936

## 2024-03-04 NOTE — Progress Notes (Signed)
 PHARMACY - TOTAL PARENTERAL NUTRITION CONSULT NOTE  Indication: Chronic ileus  Patient Measurements: Height: 5' 5 (165.1 cm) Weight: 76.1 kg (167 lb 12.3 oz) IBW/kg (Calculated) : 61.5   Body mass index is 27.92 kg/m. Usual Weight: 77.3 kg   Assessment:  53 yo patient with ALS, bed bound at baseline, and chronic ileus who has received TPN for 2 years. He presented with nausea and vomiting, found to have cholecystitis, s/p cholecyststomy tube insertion 8/24.  Patient is having bowel movements and no ileus suspected on imaging. Per surgery, patient, and wife, would like to trial tube feeds while inpatient.  Initially concerned with CLABSI and PICC removed, but blood cultures now negative.  TF was advanced to 40 ml/hr on 8/30, then abdomen became distended.  TF reduced to 20 ml/hr and then held.  Intolerant to gastric feeding.  Glucose / Insulin : A1c 5.2 - CBGs elevated when TF was increased and now acceptable off TF Used 32 units SSI in past 24 hrs and 6 units TF coverage  Electrolytes: Na improves to 138 with free water  removed from TPN, K 3.7 (goal >/= 4 for ileus), Phos up to 2.7 post IV, others WNL (CoCa high normal at 10.3) Renal: SCr < 1, BUN WNL Hepatic: LFTs improving, tbili down to 2.3 (jaundice), TG normalized, albumin 2.2 Intake / Output; MIVF: UOP 1.6 ml/kg/min, drain Reglan  10 q8h, pyridostigmine ; LBM 8/30 GI Imaging: 8/24: CT abd suspicious for acute cholecystitis GI Surgeries / Procedures:  8/24 cholecyststomy tube insertion Central access: use port per CCM (PICC removed for CLABSI concern 02/28/24) TPN start date: Year 2023   RD Estimated Needs Total Energy Estimated Needs: 1400-1600 kcal/d Total Protein Estimated Needs: 80-95 g/d Total Fluid Estimated Needs: 1.5-1.8L/d *RD reduced on 8/27  TPN Formula PTA:   [facility] pharmacist: Eva [phone]  Infusion Duration: 18hr Total Kcal: 1331 on lipids days; 931 on non lipid days  Kcal calculations  for TPN PTA based on previous weight in 2023   Protein: 80g Dextrose : 180g  Lipids: 40g (no lipids Tues/Wed/Thurs/Sat/Sun d/t elevated TG) Magnesium : 20 mEq Potassium: 180 mEq Phosphate: 37 mmol Acetate: 280.61 mEq Cl: 0 mEq  Current Nutrition:  TPN Osmolite 1.2 at 20 ml/hr via J-tube (advance 10 ml/hr Q12H to goal rate 50 ml/hr) Prosource 60mL daily  Plan:  Continue TPN at 65 mL/hr to provide 94g AA, 187g CHO, 39g ILE and 1401 kCal, meeting 100% of needs Electrolytes in TPN: Na 100 mEq/L, K 80 mEq/L (= 125 mEq/day, max in TPN), reduce Ca to 24mEq/L, Mg 15 mEq/L (max in TPN), Phos 30 mmol/L (= 47 mmol/day; max in TPN), max acetate.  Add standard MVI and 1/2 trace element in TPN Continue resistant SSI Q4H and 3 units TF coverage KPhos 15mmol IV in NS and KCL x 2 runs Monitor TPN labs on Mon/Thurs F/u TF tolerance/advancement  Ellon Marasco D. Lendell, PharmD, BCPS, BCCCP 03/04/2024, 10:57 AM

## 2024-03-04 NOTE — Progress Notes (Signed)
 NAME:  Galen Russman, MRN:  [id number], DOB:  [birth year], LOS: 7 ADMISSION DATE:  02/26/2024, CONSULTATION DATE: 02/22/2024 REFERRING MD:  Ruthe Cornet, , CHIEF COMPLAINT: Abdominal pain, nausea and vomiting  History of Present Illness:  53 year old male with advanced ALS, status post trach, vent dependent/PEG, on TPN due to not being able to tolerate tube feeds, who is being cared at home by his wife, was brought into the emergency department with complaint of abdominal pain, nausea and vomiting since yesterday.  Per patient's wife he was complaining of abdominal pain, had 3 episodes of nonbilious vomiting so she brought him here in the emergency department.  On workup he was noted to have possible acute cholecystitis, seen by general surgery recommend percutaneous cholecystostomy tube drainage.  PCCM was consulted for help evaluation and ventilator management Of note patient is spiking fever Tmax 101.7  Pertinent  Medical History   Past Medical History:  Diagnosis Date   ALS (amyotrophic lateral sclerosis) (HCC)    COVID-19 virus infection 06/2020   Meningitis 2003   spinal   Morbid obesity (HCC)    Neuromuscular disorder (HCC)    Prediabetes 11/05/2016   A1C 5.7 on 11/05/16   Type 2 diabetes mellitus (HCC) 08/31/2023    Significant Hospital Events: Including procedures, antibiotic start and stop dates in addition to other pertinent events   8/24 perc cholecystostomy by IR 8/26 persistent fever and leukocytosis 8/26 trickle tube feeds started 8/28 NPO for GJ tube exchanged. On TPN  Interim History / Subjective:  - Has evolved bloating, distended abdomen and respiratory restriction on increased TF 40 and then 50 cc/h.  TF held.  Minimal residuals (150 cc). More comfortable after G-tube was placed to suction - Cholecystostomy drain 625 cc / 24 hours - I/O+ 7.8 L total   Objective    Blood pressure 113/78, pulse 91, temperature 99.1 F (37.3 C), temperature source Axillary, resp.  rate 16, height 5' 5 (1.651 m), weight 76.1 kg, SpO2 100%.    Vent Mode: AC Set Rate:  [16 bmp] 16 bmp Vt Set:  [500 mL] 500 mL PEEP:  [5 cmH20] 5 cmH20 Plateau Pressure:  [18 cmH20-37 cmH20] 18 cmH20   Intake/Output Summary (Last 24 hours) at 03/04/2024 9075 Last data filed at 03/04/2024 0600 Gross per 24 hour  Intake 2383.17 ml  Output 2925 ml  Net -541.83 ml   Filed Weights   03/01/24 0114 03/03/24 0500 03/04/24 0500  Weight: 82.6 kg 80.6 kg 76.1 kg  Gen: Chronically ill gentleman, in bed, comfortable HEENT: Minimal oral secretions Neck: Trach in place, site looks good Chest: Coarse bilateral breath sounds, on home vent.  Not alarming, peak pressures adequate CV: Regular, no murmur Abd: Somewhat distended and tympanitic.  Positive bowel sounds, gallbladder drain with dark bilious fluid Ext: No lower extremity edema Neuro: Awake and alert, communicating with his ocular movement computer   Assessment and Plan   Sepsis due to Acute cholecystitis s/p Perc cholecystostomy tube placement by IR -Continue Zosyn .  Will have to determine duration depending on stability and planned duration of percutaneous cholecystostomy.  Suspect 7-10 days at least - Following cholecystostomy tube output - Blood cultures and bile cultures negative so far.  Note respiratory colonizers as below  Chronic hypoxic respiratory failure status post trach, vent dependent Advanced ALS Respiratory culture > Achromobacter, Pseudomonas, stenotrophomonas, MSSA -More peak pressure alarming in absence of any significant change in secretions.  Do suspect that this is abdominal compliance and restriction - Routine trach  care - Respiratory culture (colonization with Achromobacter, Pseudomonas, stenotrophomonas, MSSA.  No clear evidence to support pneumonia at this time - Follow chest x-ray 9/1  Dysphagia status post PEG, not tolerating tube feeds, currently on TPN Severe protein calorie malnutrition - Recurrent  abdominal bloating, ileus with reinitiation of TF.  Had made his way up to 40-50 cc/h but developed significant abdominal distention and TF held - Try adding reglan  8/31. Not sure neostigmine with do as much since this is a chronic dysmotility issue, but could consider > pyridostigmine  may be better.  - Trickle tube feeds via J-tube with no plans to advance for now - Continue the G-tube tube to intermittent suction and follow  Hypophosphatemia, improved -Continue to follow BMP and replete electrolytes as indicated  Discussed his status and plans with his wife on 8/29  Labs   CBC: Recent Labs  Lab 02/26/24 1005 02/27/24 0441 02/28/24 0307 02/29/24 0436 03/01/24 0950  WBC 10.4 18.8* 22.3* 15.0* 11.3*  NEUTROABS 8.2*  --  19.5* 13.1* 8.7*  HGB 11.9* 11.5* 10.7* 8.6* 8.2*  HCT 37.0* 34.9* 32.7* 26.4* 24.9*  MCV 84.3 81.5 83.2 83.0 83.0  PLT 151 207 239 228 249    Basic Metabolic Panel: Recent Labs  Lab 02/27/24 0441 02/28/24 0307 02/29/24 0436 02/29/24 2016 03/01/24 0643 03/02/24 0748 03/03/24 0341 03/04/24 0458  NA 134* 134* 132*  --  133* 135 135 138  K 3.2* 3.0* 3.6  --  4.3 3.6 4.0 3.7  CL 109 108 104  --  100 102 104 107  CO2 15* 16* 19*  --  20* 22 22 22   GLUCOSE 87 107* 210*  --  168* 172* 196* 183*  BUN 8 13 13   --  13 11 10 10   CREATININE 0.30* <0.30* <0.30*  --  <0.30* <0.30* <0.30* <0.30*  CALCIUM  8.9 8.8* 8.3*  --  8.4* 8.4* 8.5* 8.9  MG 1.6* 2.4 2.1  --  2.2  --  2.1  --   PHOS 2.0* 1.8* 1.4* 2.1* 2.0* 2.3* 2.3* 2.7   GFR: CrCl cannot be calculated (This lab value cannot be used to calculate CrCl because it is not a number: <0.30). Recent Labs  Lab 02/26/24 1829 02/27/24 0441 02/28/24 0307 02/29/24 0436 03/01/24 0950  WBC  --  18.8* 22.3* 15.0* 11.3*  LATICACIDVEN 1.0  --   --   --   --     Liver Function Tests: Recent Labs  Lab 02/26/24 1824 02/27/24 0441 02/29/24 0436 03/01/24 0643 03/02/24 0748  AST 106* 85* 35 31  --   ALT 124* 108*  56* 46*  --   ALKPHOS 373* 324* 234* 249*  --   BILITOT 5.0* 6.5* 3.8* 2.3*  --   PROT 7.5 7.6 6.5 6.5  --   ALBUMIN 2.8* 2.7* 2.2* 2.2* 2.2*   No results for input(s): LIPASE, AMYLASE in the last 168 hours.  No results for input(s): AMMONIA in the last 168 hours.  ABG    Component Value Date/Time   PHART 7.52 (H) 01/09/2022 1810   PCO2ART 26 (L) 01/09/2022 1810   PO2ART 66 (L) 01/09/2022 1810   HCO3 19.2 (L) 02/26/2024 0737   TCO2 20 (L) 02/26/2024 0737   ACIDBASEDEF 3.0 (H) 02/26/2024 0737   O2SAT 94 02/26/2024 0737     Coagulation Profile: Recent Labs  Lab 02/27/24 0441  INR 1.4*    Cardiac Enzymes: No results for input(s): CKTOTAL, CKMB, CKMBINDEX, TROPONINI in the last 168 hours.  HbA1C: Hgb A1c MFr Bld  Date/Time Value Ref Range Status  02/26/2024 06:28 PM 5.2 4.8 - 5.6 % Final    Comment:    (NOTE) Diagnosis of Diabetes The following HbA1c ranges recommended by the American Diabetes Association (ADA) may be used as an aid in the diagnosis of diabetes mellitus.  Hemoglobin             Suggested A1C NGSP%              Diagnosis  <5.7                   Non Diabetic  5.7-6.4                Pre-Diabetic  >6.4                   Diabetic  <7.0                   Glycemic control for                       adults with diabetes.    12/09/2021 11:05 AM 5.4 4.8 - 5.6 % Final    Comment:    (NOTE) Pre diabetes:          5.7%-6.4%  Diabetes:              >6.4%  Glycemic control for   <7.0% adults with diabetes     CBG: Recent Labs  Lab 03/03/24 1516 03/03/24 1952 03/03/24 2302 03/04/24 0342 03/04/24 0832  GLUCAP 222* 186* 178* 178* 155*       Lamar Chris, MD, PhD 03/04/2024, 9:24 AM [facility] [phone] or if no answer before 7:00PM call [phone] For any issues after 7:00PM please call eLink [phone]

## 2024-03-05 DIAGNOSIS — K810 Acute cholecystitis: Secondary | ICD-10-CM | POA: Diagnosis not present

## 2024-03-05 DIAGNOSIS — A419 Sepsis, unspecified organism: Secondary | ICD-10-CM | POA: Diagnosis not present

## 2024-03-05 DIAGNOSIS — J9611 Chronic respiratory failure with hypoxia: Secondary | ICD-10-CM | POA: Diagnosis not present

## 2024-03-05 DIAGNOSIS — Z930 Tracheostomy status: Secondary | ICD-10-CM | POA: Diagnosis not present

## 2024-03-05 LAB — MAGNESIUM: Magnesium: 2 mg/dL (ref 1.7–2.4)

## 2024-03-05 LAB — GLUCOSE, CAPILLARY
Glucose-Capillary: 128 mg/dL — ABNORMAL HIGH (ref 70–99)
Glucose-Capillary: 160 mg/dL — ABNORMAL HIGH (ref 70–99)
Glucose-Capillary: 158 mg/dL — ABNORMAL HIGH (ref 70–99)
Glucose-Capillary: 172 mg/dL — ABNORMAL HIGH (ref 70–99)
Glucose-Capillary: 186 mg/dL — ABNORMAL HIGH (ref 70–99)
Glucose-Capillary: 184 mg/dL — ABNORMAL HIGH (ref 70–99)

## 2024-03-05 LAB — COMPREHENSIVE METABOLIC PANEL WITH GFR
ALT: 36 U/L (ref 0–44)
AST: 31 U/L (ref 15–41)
Albumin: 1.8 g/dL — ABNORMAL LOW (ref 3.5–5.0)
Alkaline Phosphatase: 354 U/L — ABNORMAL HIGH (ref 38–126)
Anion gap: 9 (ref 5–15)
BUN: 10 mg/dL (ref 6–20)
CO2: 21 mmol/L — ABNORMAL LOW (ref 22–32)
Calcium: 8.6 mg/dL — ABNORMAL LOW (ref 8.9–10.3)
Chloride: 104 mmol/L (ref 98–111)
Creatinine, Ser: <0.3 mg/dL — ABNORMAL LOW (ref 0.61–1.24)
Glucose, Bld: 131 mg/dL — ABNORMAL HIGH (ref 70–99)
Potassium: 3.9 mmol/L (ref 3.5–5.1)
Sodium: 134 mmol/L — ABNORMAL LOW (ref 135–145)
Total Bilirubin: 1.2 mg/dL (ref 0.0–1.2)
Total Protein: 6.5 g/dL (ref 6.5–8.1)

## 2024-03-05 LAB — PHOSPHORUS: Phosphorus: 3 mg/dL (ref 2.5–4.6)

## 2024-03-05 LAB — TRIGLYCERIDES: Triglycerides: 120 mg/dL (ref <150)

## 2024-03-05 MED ORDER — POTASSIUM CHLORIDE 20 MEQ PO PACK
20.0000 meq | PACK | Freq: Every day | ORAL | Status: DC
  Administered 2024-03-05 – 2024-03-12 (×8): 20 meq
  Filled 2024-03-05 (×8): qty 1

## 2024-03-05 MED ORDER — POTASSIUM & SODIUM PHOSPHATES 280-160-250 MG PO PACK
1.0000 | PACK | Freq: Every day | ORAL | Status: DC
  Administered 2024-03-05 – 2024-03-12 (×8): 1
  Filled 2024-03-05 (×8): qty 1

## 2024-03-05 MED ORDER — TRAVASOL 10 % IV SOLN
INTRAVENOUS | Status: AC
  Filled 2024-03-05: qty 889.2

## 2024-03-05 MED ORDER — INSULIN ASPART 100 UNIT/ML IJ SOLN
5.0000 [IU] | INTRAMUSCULAR | Status: DC
  Administered 2024-03-05 – 2024-03-12 (×43): 5 [IU] via SUBCUTANEOUS

## 2024-03-05 NOTE — Plan of Care (Signed)
  Problem: Clinical Measurements: Goal: Ability to maintain clinical measurements within normal limits will improve Outcome: Progressing Goal: Will remain free from infection Outcome: Progressing Goal: Cardiovascular complication will be avoided Outcome: Progressing   Problem: Pain Managment: Goal: General experience of comfort will improve and/or be controlled Outcome: Progressing   Problem: Skin Integrity: Goal: Risk for impaired skin integrity will decrease Outcome: Not Progressing   Problem: Health Behavior/Discharge Planning: Goal: Ability to manage health-related needs will improve Outcome: Not Progressing   Problem: Clinical Measurements: Goal: Respiratory complications will improve Outcome: Not Progressing   Problem: Skin Integrity: Goal: Risk for impaired skin integrity will decrease Outcome: Not Progressing

## 2024-03-05 NOTE — Progress Notes (Signed)
 NAME:  Jerry [name], MRN:  [id number], DOB:  [birth year], LOS: 8 ADMISSION DATE:  02/26/2024, CONSULTATION DATE: 02/22/2024 REFERRING MD:  Ruthe Cornet, , CHIEF COMPLAINT: Abdominal pain, nausea and vomiting  History of Present Illness:  53 year old male with advanced ALS, status post trach, vent dependent/PEG, on TPN due to not being able to tolerate tube feeds, who is being cared at home by his wife, was brought into the emergency department with complaint of abdominal pain, nausea and vomiting since yesterday.  Per patient's wife he was complaining of abdominal pain, had 3 episodes of nonbilious vomiting so she brought him here in the emergency department.  On workup he was noted to have possible acute cholecystitis, seen by general surgery recommend percutaneous cholecystostomy tube drainage.  PCCM was consulted for help evaluation and ventilator management Of note patient is spiking fever Tmax 101.7  Pertinent  Medical History   Past Medical History:  Diagnosis Date   ALS (amyotrophic lateral sclerosis) (HCC)    COVID-19 virus infection 06/2020   Meningitis 2003   spinal   Morbid obesity (HCC)    Neuromuscular disorder (HCC)    Prediabetes 11/05/2016   A1C 5.7 on 11/05/16   Type 2 diabetes mellitus (HCC) 08/31/2023    Significant Hospital Events: Including procedures, antibiotic start and stop dates in addition to other pertinent events   8/24 perc cholecystostomy by IR 8/26 persistent fever and leukocytosis 8/26 trickle tube feeds started 8/28 NPO for GJ tube exchanged. On TPN  Interim History / Subjective:  -TF decreased to 25 cc/h on 8/31 - Cholecystostomy drain 750 cc / 24 hours (1935 cc / 72 hours) - I/O+ 8.9 L total    Objective    Blood pressure 125/77, pulse (!) 107, temperature 98.6 F (37 C), temperature source Axillary, resp. rate 16, height 5' 5 (1.651 m), weight 75.5 kg, SpO2 100%.    Vent Mode: AC Set Rate:  [16 bmp] 16 bmp Vt Set:  [500 mL] 500  mL PEEP:  [5 cmH20] 5 cmH20 Plateau Pressure:  [17 cmH20-24 cmH20] 22 cmH20   Intake/Output Summary (Last 24 hours) at 03/05/2024 0901 Last data filed at 03/05/2024 9165 Gross per 24 hour  Intake 3100.16 ml  Output 2200 ml  Net 900.16 ml   Filed Weights   03/03/24 0500 03/04/24 0500 03/05/24 0500  Weight: 80.6 kg 76.1 kg 75.5 kg  Gen: Chronically ill gentleman, comfortable, no distress HEENT: Some oral secretions present Neck: Trach in place, site looks good Chest: Coarse bilateral breath sounds, home bit.  Not alarming.  Peak pressures acceptable CV: Regular, no murmur Abd: Somewhat distended and tympanitic, mild tenderness on palpation.  No guarding.  Positive bowel sounds, cholecystostomy drain with dark bile Ext: No lower extremity edema Neuro: He is awake and alert, communicates with eye movements and with his ocular movement computer   Assessment and Plan   Sepsis due to Acute cholecystitis s/p Perc cholecystostomy tube placement by IR -Continue Zosyn .  Plan for 10 days total and then DC - Following cholecystostomy tube output.  Question duration, but it currently has high output (almost 2 L over 72 hours) - Following culture data, blood cultures and bile cultures negative so far.  He does have respiratory colonizers  Chronic hypoxic respiratory failure status post trach, vent dependent Advanced ALS Respiratory culture > Achromobacter, Pseudomonas, stenotrophomonas, MSSA -Improved peak pressures.  Plan to continue his current home ventilator settings - Routine trach care - Respiratory cultures with colonization with Achromobacter, Pseudomonas, stenotrophomonas,  MSSA without any clear evidence of bronchitis or pneumonia at this time - Following chest x-ray intermittently  Dysphagia status post PEG, not tolerating tube feeds, currently on TPN Severe protein calorie malnutrition -Abdominal bloating seems to be improved with G-tube to suction - Tolerating J-tube feeds at 30  cc/h currently, will try to slowly advance - No bowel movement reported last 24 hours, plan for bowel regimen, MiraLAX  today  Hypophosphatemia, improved -Continue to follow BMP and replete electrolytes as indicated  Discussed his status and plans with his wife on 8/29  Labs   CBC: Recent Labs  Lab 02/28/24 0307 02/29/24 0436 03/01/24 0950  WBC 22.3* 15.0* 11.3*  NEUTROABS 19.5* 13.1* 8.7*  HGB 10.7* 8.6* 8.2*  HCT 32.7* 26.4* 24.9*  MCV 83.2 83.0 83.0  PLT 239 228 249    Basic Metabolic Panel: Recent Labs  Lab 02/28/24 0307 02/29/24 0436 02/29/24 2016 03/01/24 0643 03/02/24 0748 03/03/24 0341 03/04/24 0458 03/05/24 0301  NA 134* 132*  --  133* 135 135 138 134*  K 3.0* 3.6  --  4.3 3.6 4.0 3.7 3.9  CL 108 104  --  100 102 104 107 104  CO2 16* 19*  --  20* 22 22 22  21*  GLUCOSE 107* 210*  --  168* 172* 196* 183* 131*  BUN 13 13  --  13 11 10 10 10   CREATININE <0.30* <0.30*  --  <0.30* <0.30* <0.30* <0.30* <0.30*  CALCIUM  8.8* 8.3*  --  8.4* 8.4* 8.5* 8.9 8.6*  MG 2.4 2.1  --  2.2  --  2.1  --  2.0  PHOS 1.8* 1.4*   < > 2.0* 2.3* 2.3* 2.7 3.0   < > = values in this interval not displayed.   GFR: CrCl cannot be calculated (This lab value cannot be used to calculate CrCl because it is not a number: <0.30). Recent Labs  Lab 02/28/24 0307 02/29/24 0436 03/01/24 0950  WBC 22.3* 15.0* 11.3*    Liver Function Tests: Recent Labs  Lab 02/29/24 0436 03/01/24 0643 03/02/24 0748 03/05/24 0301  AST 35 31  --  31  ALT 56* 46*  --  36  ALKPHOS 234* 249*  --  354*  BILITOT 3.8* 2.3*  --  1.2  PROT 6.5 6.5  --  6.5  ALBUMIN 2.2* 2.2* 2.2* 1.8*   No results for input(s): LIPASE, AMYLASE in the last 168 hours.  No results for input(s): AMMONIA in the last 168 hours.  ABG    Component Value Date/Time   PHART 7.52 (H) 01/09/2022 1810   PCO2ART 26 (L) 01/09/2022 1810   PO2ART 66 (L) 01/09/2022 1810   HCO3 19.2 (L) 02/26/2024 0737   TCO2 20 (L) 02/26/2024  0737   ACIDBASEDEF 3.0 (H) 02/26/2024 0737   O2SAT 94 02/26/2024 0737     Coagulation Profile: No results for input(s): INR, PROTIME in the last 168 hours.   Cardiac Enzymes: No results for input(s): CKTOTAL, CKMB, CKMBINDEX, TROPONINI in the last 168 hours.  HbA1C: Hgb A1c MFr Bld  Date/Time Value Ref Range Status  02/26/2024 06:28 PM 5.2 4.8 - 5.6 % Final    Comment:    (NOTE) Diagnosis of Diabetes The following HbA1c ranges recommended by the American Diabetes Association (ADA) may be used as an aid in the diagnosis of diabetes mellitus.  Hemoglobin             Suggested A1C NGSP%  Diagnosis  <5.7                   Non Diabetic  5.7-6.4                Pre-Diabetic  >6.4                   Diabetic  <7.0                   Glycemic control for                       adults with diabetes.    12/09/2021 11:05 AM 5.4 4.8 - 5.6 % Final    Comment:    (NOTE) Pre diabetes:          5.7%-6.4%  Diabetes:              >6.4%  Glycemic control for   <7.0% adults with diabetes     CBG: Recent Labs  Lab 03/04/24 1616 03/04/24 1928 03/04/24 2329 03/05/24 0311 03/05/24 0819  GLUCAP 161* 137* 143* 128* 160*       Lamar Chris, MD, PhD 03/05/2024, 9:01 AM [facility] [phone] or if no answer before 7:00PM call [phone] For any issues after 7:00PM please call eLink [phone]

## 2024-03-05 NOTE — Progress Notes (Signed)
 PHARMACY - TOTAL PARENTERAL NUTRITION CONSULT NOTE  Indication: Chronic ileus  Patient Measurements: Height: 5' 5 (165.1 cm) Weight: 75.5 kg (166 lb 7.2 oz) IBW/kg (Calculated) : 61.5   Body mass index is 27.7 kg/m. Usual Weight: 77.3 kg   Assessment:  53 yo patient with ALS, bed bound at baseline, and chronic ileus who has received TPN for 2 years. He presented with nausea and vomiting, found to have cholecystitis, s/p cholecyststomy tube insertion 8/24.  Patient is having bowel movements and no ileus suspected on imaging. Per surgery, patient, and wife, would like to trial tube feeds while inpatient.  Initially concerned with CLABSI and PICC removed, but blood cultures now negative.  TF was advanced to 40 ml/hr on 8/30, then abdomen became distended.  TF reduced to 20 ml/hr and then held.  Intolerant to gastric feeding, so feeding via Jtube starting 8/31.  Glucose / Insulin : A1c 5.2 - CBGs controlled Used 22 units SSI in past 24 hrs and 12 units TF coverage  Electrolytes: Na down to 134, K 3.9 (goal >/= 4 for ileus) and Phos up to 3 post KPhos 15mmol IV and KCL x 2 runs (been getting KPhos repletion daily for several days), low CO2 (max acetate in TPN), others WNL Renal: SCr < 1, BUN WNL Hepatic: LFTs WNL except alk phos 354, tbili normalized (no jaundice), TG normalized, albumin 1.8 Intake / Output; MIVF: UOP 1.6 ml/kg/min, drain Reglan  10 q8h, pyridostigmine ; LBM 8/30 GI Imaging: 8/24: CT abd suspicious for acute cholecystitis GI Surgeries / Procedures:  8/24 cholecyststomy tube insertion Central access: use port per CCM (PICC removed for CLABSI concern 02/28/24) TPN start date: Year 2023   RD Estimated Needs Total Energy Estimated Needs: 1400-1600 kcal/d Total Protein Estimated Needs: 80-95 g/d Total Fluid Estimated Needs: 1.5-1.8L/d *RD reduced on 8/27  TPN Formula PTA:   [facility] pharmacist: Eva [phone]  Infusion Duration: 18hr Total Kcal: 1331 on lipids  days; 931 on non lipid days  Kcal calculations for TPN PTA based on previous weight in 2023   Protein: 80g Dextrose : 180g  Lipids: 40g (no lipids Tues/Wed/Thurs/Sat/Sun d/t elevated TG) Magnesium : 20 mEq Potassium: 180 mEq Phosphate: 37 mmol Acetate: 280.61 mEq Cl: 0 mEq **patient has been requiring less K, more Phos and usual amount of lipids without raising TG in the hospital  Current Nutrition:  TPN Osmolite 1.2 at 30 ml/hr via J-tube (advance 10 ml/hr Q12H to goal rate 50 ml/hr) Prosource 60mL daily  Plan:  Continue TPN at 65 mL/hr to provide 89g AA, 187g CHO, 42g ILE and 1414 kCal, meeting 100% of needs Electrolytes in TPN: increase Na to 150 mEq/L, K 80 mEq/L (= 125 mEq/day, max in TPN), reduce Ca to 58mEq/L on 8/31, Mg 15 mEq/L (max in TPN), Phos 30 mmol/L (= 47 mmol/day; max in TPN), max acetate.  Add standard MVI and trace element in TPN Continue resistant SSI Q4H and increase to 5 units TF coverage Schedule KCL 20mEq PT daily and PhosNAK 2 packets PT daily Monitor TPN labs on Mon/Thurs - consider repeating labs on 9/3 F/u TF tolerance/advancement to wean TPN  Fareeha Evon D. Lendell, PharmD, BCPS, BCCCP 03/05/2024, 11:18 AM

## 2024-03-05 NOTE — Plan of Care (Signed)
  Problem: Clinical Measurements: Goal: Diagnostic test results will improve Outcome: Progressing Goal: Signs and symptoms of infection will decrease Outcome: Progressing   Problem: Respiratory: Goal: Ability to maintain adequate ventilation will improve Outcome: Progressing   Problem: Nutritional: Goal: Maintenance of adequate nutrition will improve Outcome: Progressing   Problem: Tissue Perfusion: Goal: Adequacy of tissue perfusion will improve Outcome: Progressing

## 2024-03-06 DIAGNOSIS — Z930 Tracheostomy status: Secondary | ICD-10-CM | POA: Diagnosis not present

## 2024-03-06 DIAGNOSIS — J9611 Chronic respiratory failure with hypoxia: Secondary | ICD-10-CM | POA: Diagnosis not present

## 2024-03-06 DIAGNOSIS — A419 Sepsis, unspecified organism: Secondary | ICD-10-CM | POA: Diagnosis not present

## 2024-03-06 DIAGNOSIS — Z931 Gastrostomy status: Secondary | ICD-10-CM | POA: Diagnosis not present

## 2024-03-06 DIAGNOSIS — G1221 Amyotrophic lateral sclerosis: Secondary | ICD-10-CM | POA: Diagnosis not present

## 2024-03-06 DIAGNOSIS — K810 Acute cholecystitis: Secondary | ICD-10-CM | POA: Diagnosis not present

## 2024-03-06 LAB — GLUCOSE, CAPILLARY
Glucose-Capillary: 130 mg/dL — ABNORMAL HIGH (ref 70–99)
Glucose-Capillary: 169 mg/dL — ABNORMAL HIGH (ref 70–99)
Glucose-Capillary: 170 mg/dL — ABNORMAL HIGH (ref 70–99)
Glucose-Capillary: 183 mg/dL — ABNORMAL HIGH (ref 70–99)
Glucose-Capillary: 184 mg/dL — ABNORMAL HIGH (ref 70–99)
Glucose-Capillary: 204 mg/dL — ABNORMAL HIGH (ref 70–99)

## 2024-03-06 LAB — SUSCEPTIBILITY RESULT

## 2024-03-06 LAB — SUSCEPTIBILITY, AER + ANAEROB

## 2024-03-06 NOTE — Progress Notes (Signed)
 Progress Note   Patient: Jerry [name] FMW:[id number] DOB: 1971/05/01 DOA: 02/26/2024     9 DOS: the patient was seen and examined on 03/06/2024   Brief hospital course: Amadu Schlageter is a 53 year old male with advanced ALS, status post trach, vent dependent/ PEG, but on TPN as unable to tolerate tube feeds, brought to the emergency department with complaint of abdominal pain, nausea and vomiting since 1 day. Per wife he had 3 episodes of nonbilious vomiting. On workup he was noted to have possible acute cholecystitis, seen by general surgery recommend percutaneous cholecystostomy tube drainage. Cholecystostomy tube place by IR 02/26/24, continued on Zosyn  therapy. G tube to GJ conversion done by IR 8/28. PCCM on board for ventilator management. Patient tolerating tube feeds. He is on TPN wean down. Transferred to [facility] service 03/06/24.   Assessment and Plan: Sepsis Acute cholecystitis S/p percutaneous cholecystostomy tube 8/24: Continue Zosyn  for total 10 days. Follow chole tube output.  IR follow up for tube management.  Chronic hypoxic respiratory failure S/p trach, Vent dependant PCCM follow up for vent management appreciated. Continue ventilator support, wean O2 as able. Continue tracheostomy care. Respiratory cultures possible colonization, no signs of infection. Follow pulmonary recommendations.  Advanced ALS Dysphagia s/p PEG Transitioned to GJ tube by IR 8/28. Continue to wean off TPN. He is able to tolerate tube feeds. Communication thru eye tracking system.  At risk for malnutrition Continue tube feeds, supplements Dietician on board.     Out of bed to chair. Incentive spirometry. Nursing supportive care. Fall, aspiration precautions. Diet:  Diet Orders (From admission, onward)     Start     Ordered   03/01/24 0001  Diet NPO time specified  Diet effective midnight        02/29/24 0957           DVT prophylaxis: heparin  injection 5,000 Units Start: 02/26/24  1715 SCDs Start: 02/26/24 1537  Level of care: ICU   Code Status: Full Code  Subjective: Patient is seen and examined today morning. He is able to communicate with his eye tracking system, states he feels fine. Has constipation. Tolerating feeds well. Wife, care taker at bedside.  Physical Exam: Vitals:   03/06/24 1537 03/06/24 1600 03/06/24 1630 03/06/24 1700  BP:  122/67 116/68 123/77  Pulse:  95 94 92  Resp:  16 17 16   Temp: 98.5 F (36.9 C)     TempSrc: Oral     SpO2:  100% 100% 100%  Weight:      Height:        General -  Middle aged African American male, no apparent distress HEENT - PERRLA, EOMI, atraumatic head, non tender sinuses. Lung - Clear, diffuse rales, s/p trach to vent. Heart - S1, S2 heard, no murmurs, rubs, trace pedal edema. Abdomen - Soft, non tender, RUQ drain, GJ tube noted. Neuro - Alert, awake and oriented, paraplegia due to ALS. Skin - Warm and dry.  Data Reviewed:      Latest Ref Rng & Units 03/01/2024    9:50 AM 02/29/2024    4:36 AM 02/28/2024    3:07 AM  CBC  WBC 4.0 - 10.5 K/uL 11.3  15.0  22.3   Hemoglobin 13.0 - 17.0 g/dL 8.2  8.6  89.2   Hematocrit 39.0 - 52.0 % 24.9  26.4  32.7   Platelets 150 - 400 K/uL 249  228  239       Latest Ref Rng & Units 03/05/2024  3:01 AM 03/04/2024    4:58 AM 03/03/2024    3:41 AM  BMP  Glucose 70 - 99 mg/dL 868  816  803   BUN 6 - 20 mg/dL 10  10  10    Creatinine 0.61 - 1.24 mg/dL <9.69  <9.69  <9.69   Sodium 135 - 145 mmol/L 134  138  135   Potassium 3.5 - 5.1 mmol/L 3.9  3.7  4.0   Chloride 98 - 111 mmol/L 104  107  104   CO2 22 - 32 mmol/L 21  22  22    Calcium  8.9 - 10.3 mg/dL 8.6  8.9  8.5    No results found.  Family Communication: Discussed with patient, wife at bedside. They understand and agree. All questions answered.  Disposition: Status is: Inpatient Remains inpatient appropriate because: severity of illness  Planned Discharge Destination: Home with Home Health     Time spent:  53 minutes  Author: Concepcion Riser, MD 03/06/2024 6:11 PM Secure chat 7am to 7pm For on call review [email or web address].

## 2024-03-06 NOTE — Progress Notes (Signed)
 Nutrition Follow-up  DOCUMENTATION CODES:  Not applicable  INTERVENTION:  Recommend the following via G-J tube: Osmolite 1.2 at 50 ml/h (1200 ml per day)  Prosource TF20 60 ml 1x/d Provides 1520 kcal, 87 gm protein, 984 ml free water  daily Ready to transition off TPN, MD and family agree For home use, Nestle equivalent is Isosource HN Would recommend increasing rate to 60mL/h (1337mL/d) to account for loss of additional nutrition from prosource.  NUTRITION DIAGNOSIS:  Inadequate oral intake related to inability to eat, altered GI function as evidenced by NPO status. - remains applicable   GOAL:  Patient will meet greater than or equal to 90% of their needs - progressing, being met with TF  MONITOR:  Labs, Weight trends, I & O's  REASON FOR ASSESSMENT:  Consult Enteral/tube feeding initiation and management  ASSESSMENT:  53 year old male who presented to the ED on 02/26/24 with abdominal pain, distention, and emesis. PMH of advanced ALS, chronic hypoxic respiratory failure s/p trach, vent dependence, G-tube in place, TPN dependence. Pt admitted with sepsis due to acute cholecystitis.  8/24 - s/p cholecystostomy catheter placement by IR  8/26 - trickle tube feeds started 8/28 - NPO for GJ tube exchanged. On TPN 9/1- TF reached goal rate  Spoke with pt and pt's family at bedside. Pt tolerating tube feeds via PEG at goal rate. RN reports no issues with tolerance at this time. Pt had bowel movement this morning. Family eager to wean off TPN as pt has been TPN reliant since 2023. Discussed with Pharmacy and MD, agree to discontinue TPN and continue to monitor tube feeds.  Recommend Osmolite 1.2 equivalent of Nestle in outpatient: Isosource HN @ 55 mL/hr  MV: 8.3 L/min Temp (24hrs), Avg:98.7 F (37.1 C), Min:98.3 F (36.8 C), Max:99 F (37.2 C) MAP (cuff): 77 mmHg  Admit weight: 76.9 kg  Current weight: 75.5 kg    Intake/Output Summary (Last 24 hours) at 03/06/2024  1254 Last data filed at 03/06/2024 1100 Gross per 24 hour  Intake 2998.1 ml  Output 1175 ml  Net 1823.1 ml  Net IO Since Admission: 11,312.65 mL [03/06/24 1254]  Drains/Lines: Implanted port PEG-J, 24 Fr. LUQ JP drain RLQ 80mL x 24 hours UOP x 24 hours  Nutritionally Relevant Medications: Colace SSI Novolog  q 4 hours Novolog  5 units q 4 hours Reglan  Miralax  Potassium chloride  20 mEq Zosyn    PRN Meds: docusate, ondansetron , polyethylene glycol, simethicone   Labs Reviewed: Creatinine <0.30 Phosphorus 3.0 <-- 2.3 Sodium 134 CBG ranges from 158-186 mg/dL over the last 24 hours HgbA1c 5.2   Diet Order:   Diet Order             Diet NPO time specified  Diet effective midnight                   EDUCATION NEEDS:  Not appropriate for education at this time  Skin:  Skin Assessment: Reviewed RN Assessment  Last BM:  9/2  Height:  Ht Readings from Last 1 Encounters:  03/02/24 5' 5 (1.651 m)    Weight:  Wt Readings from Last 1 Encounters:  03/06/24 75.5 kg    Ideal Body Weight:  55.6 kg (adjusted by 10% for quadriplegia)  BMI:  Body mass index is 27.7 kg/m.  Estimated Nutritional Needs:  Kcal:  1400-1600 kcal/d Protein:  80-95 g/d Fluid:  1.5-1.8L/d    Josette Glance, MS, RDN, LDN Clinical Dietitian I Please reach out via secure chat

## 2024-03-06 NOTE — TOC CM/SW Note (Signed)
 Transition of Care [facility]) - Inpatient Brief Assessment   Patient Details  Name: Jerry [name] MRN: [id number] Date of Birth: [birth year]  Transition of Care [facility]) CM/SW Contact:    Lauraine FORBES Saa, LCSWA Phone Number: 03/06/2024, 11:19 AM   Clinical Narrative:  11:19 AM Per progressions, patent received G to GJ exchange/conversion 08/28. TOC will continue to follow and be available to assist.  Transition of Care Asessment: Insurance and Status: Insurance coverage has been reviewed Patient has primary care physician: Yes Home environment has been reviewed: Private Residence Prior level of function:: N/A Prior/Current Home Services: No current home services Social Drivers of Health Review: SDOH reviewed no interventions necessary Readmission risk has been reviewed: Yes (Currently Green 13%) Transition of care needs: no transition of care needs at this time

## 2024-03-06 NOTE — Progress Notes (Addendum)
 NAME:  Jerry [name], MRN:  [id number], DOB:  26-Feb-1971, LOS: 9 ADMISSION DATE:  02/26/2024, CONSULTATION DATE: 02/22/2024 REFERRING MD:  Ruthe Cornet, , CHIEF COMPLAINT: Abdominal pain, nausea and vomiting  History of Present Illness:  53 year old male with advanced ALS, status post trach, vent dependent/PEG, on TPN due to not being able to tolerate tube feeds, who is being cared at home by his wife, was brought into the emergency department with complaint of abdominal pain, nausea and vomiting since yesterday.  Per patient's wife he was complaining of abdominal pain, had 3 episodes of nonbilious vomiting so she brought him here in the emergency department.  On workup he was noted to have possible acute cholecystitis, seen by general surgery recommend percutaneous cholecystostomy tube drainage.  PCCM was consulted for help evaluation and ventilator management Of note patient is spiking fever Tmax 101.7  Pertinent  Medical History   Past Medical History:  Diagnosis Date   ALS (amyotrophic lateral sclerosis) (HCC)    COVID-19 virus infection 06/2020   Meningitis 2003   spinal   Morbid obesity (HCC)    Neuromuscular disorder (HCC)    Prediabetes 11/05/2016   A1C 5.7 on 11/05/16   Type 2 diabetes mellitus (HCC) 08/31/2023    Significant Hospital Events: Including procedures, antibiotic start and stop dates in addition to other pertinent events   8/24 perc cholecystostomy by IR 8/26 persistent fever and leukocytosis 8/26 trickle tube feeds started 8/28 NPO for GJ tube exchanged. On TPN  Interim History / Subjective:   Remains on home vent Minimal secretions per RT  Objective    Blood pressure 111/65, pulse 86, temperature 98.3 F (36.8 C), temperature source Oral, resp. rate 16, height 5' 5 (1.651 m), weight 75.5 kg, SpO2 100%.    Vent Mode: AC Set Rate:  [16 bmp] 16 bmp Vt Set:  [500 mL] 500 mL PEEP:  [5 cmH20] 5 cmH20 Plateau Pressure:  [17 cmH20-18 cmH20] 17 cmH20    Intake/Output Summary (Last 24 hours) at 03/06/2024 0853 Last data filed at 03/06/2024 0700 Gross per 24 hour  Intake 2960.92 ml  Output 1190 ml  Net 1770.92 ml   Filed Weights   03/04/24 0500 03/05/24 0500 03/06/24 0453  Weight: 76.1 kg 75.5 kg 75.5 kg   Physical Exam: General: Chronically ill-appearing, no acute distress HENT: [place], AT, OP clear, MMM Neck: Trach in place, connected to home vent Eyes: EOMI, no scleral icterus Respiratory: Clear to auscultation bilaterally.  No crackles, wheezing or rales Cardiovascular: RRR, -M/R/G, no JVD GI: BS+, soft, nontender, cholecystostomy drain Extremities:-Edema,-tenderness Neuro: AAO x2, intact ocular movements used to communicate with language device GU: Foley in place   Assessment and Plan    Chronic hypoxic respiratory failure status post trach, vent dependent Advanced ALS Respiratory culture > Achromobacter, Pseudomonas, stenotrophomonas, MSSA. Suspected colonizers - Continue his current home ventilator settings. No changes. Minimal clear secretions present - Routine trach care - Respiratory cultures with colonization with Achromobacter, Pseudomonas, stenotrophomonas, MSSA without any clear evidence of bronchitis or pneumonia at this time - Following chest x-ray intermittently   Other medical issues addressed by primary team:  Sepsis due to Acute cholecystitis s/p Perc cholecystostomy tube placement by IR - Continue Zosyn .  Plan for 10 days total and then DC - Following cholecystostomy tube output.  Question duration, but it currently has high output (almost 2 L over 72 hours) - Following culture data, blood cultures and bile cultures negative so far.  He does have respiratory colonizers  Advanced ALS Dysphagia status post PEG, not tolerating tube feeds, currently on TPN Severe protein calorie malnutrition - Supportive care - Abdominal bloating seems to be improved with G-tube to suction - Previously on TPN but currently  TF at goal. Defer to primary team to discuss with family discontinuing TPN and and arranging care when closer to discharge   Labs   CBC: Recent Labs  Lab 02/29/24 0436 03/01/24 0950  WBC 15.0* 11.3*  NEUTROABS 13.1* 8.7*  HGB 8.6* 8.2*  HCT 26.4* 24.9*  MCV 83.0 83.0  PLT 228 249    Basic Metabolic Panel: Recent Labs  Lab 02/29/24 0436 02/29/24 2016 03/01/24 0643 03/02/24 0748 03/03/24 0341 03/04/24 0458 03/05/24 0301  NA 132*  --  133* 135 135 138 134*  K 3.6  --  4.3 3.6 4.0 3.7 3.9  CL 104  --  100 102 104 107 104  CO2 19*  --  20* 22 22 22  21*  GLUCOSE 210*  --  168* 172* 196* 183* 131*  BUN 13  --  13 11 10 10 10   CREATININE <0.30*  --  <0.30* <0.30* <0.30* <0.30* <0.30*  CALCIUM  8.3*  --  8.4* 8.4* 8.5* 8.9 8.6*  MG 2.1  --  2.2  --  2.1  --  2.0  PHOS 1.4*   < > 2.0* 2.3* 2.3* 2.7 3.0   < > = values in this interval not displayed.   GFR: CrCl cannot be calculated (This lab value cannot be used to calculate CrCl because it is not a number: <0.30). Recent Labs  Lab 02/29/24 0436 03/01/24 0950  WBC 15.0* 11.3*    Liver Function Tests: Recent Labs  Lab 02/29/24 0436 03/01/24 0643 03/02/24 0748 03/05/24 0301  AST 35 31  --  31  ALT 56* 46*  --  36  ALKPHOS 234* 249*  --  354*  BILITOT 3.8* 2.3*  --  1.2  PROT 6.5 6.5  --  6.5  ALBUMIN 2.2* 2.2* 2.2* 1.8*   No results for input(s): LIPASE, AMYLASE in the last 168 hours.  No results for input(s): AMMONIA in the last 168 hours.  ABG    Component Value Date/Time   PHART 7.52 (H) 01/09/2022 1810   PCO2ART 26 (L) 01/09/2022 1810   PO2ART 66 (L) 01/09/2022 1810   HCO3 19.2 (L) 02/26/2024 0737   TCO2 20 (L) 02/26/2024 0737   ACIDBASEDEF 3.0 (H) 02/26/2024 0737   O2SAT 94 02/26/2024 0737     Coagulation Profile: No results for input(s): INR, PROTIME in the last 168 hours.   Cardiac Enzymes: No results for input(s): CKTOTAL, CKMB, CKMBINDEX, TROPONINI in the last 168  hours.  HbA1C: Hgb A1c MFr Bld  Date/Time Value Ref Range Status  02/26/2024 06:28 PM 5.2 4.8 - 5.6 % Final    Comment:    (NOTE) Diagnosis of Diabetes The following HbA1c ranges recommended by the American Diabetes Association (ADA) may be used as an aid in the diagnosis of diabetes mellitus.  Hemoglobin             Suggested A1C NGSP%              Diagnosis  <5.7                   Non Diabetic  5.7-6.4                Pre-Diabetic  >6.4  Diabetic  <7.0                   Glycemic control for                       adults with diabetes.    12/09/2021 11:05 AM 5.4 4.8 - 5.6 % Final    Comment:    (NOTE) Pre diabetes:          5.7%-6.4%  Diabetes:              >6.4%  Glycemic control for   <7.0% adults with diabetes     CBG: Recent Labs  Lab 03/05/24 1550 03/05/24 2014 03/05/24 2259 03/06/24 0348 03/06/24 0752  GLUCAP 172* 186* 184* 184* 183*    Care Time: 25 min Discussed plan with patient, wife at bedside and RN  Slater Staff, M.D. [facility] Pulmonary/Critical Care Medicine 03/06/2024 8:53 AM   See Amion for personal pager For hours between 7 PM to 7 AM, please call Elink for urgent questions

## 2024-03-06 NOTE — Progress Notes (Signed)
 PHARMACY - TOTAL PARENTERAL NUTRITION CONSULT NOTE  Indication: Chronic ileus  Patient Measurements: Height: 5' 5 (165.1 cm) Weight: 75.5 kg (166 lb 7.2 oz) IBW/kg (Calculated) : 61.5   Body mass index is 27.7 kg/m. Usual Weight: 77.3 kg   Assessment:  53 yo patient with ALS, bed bound at baseline, and chronic ileus who has received TPN for 2 years. He presented with nausea and vomiting, found to have cholecystitis, s/p cholecyststomy tube insertion 8/24.  Patient is having bowel movements and no ileus suspected on imaging. Per surgery, patient, and wife, would like to trial tube feeds while inpatient.  Initially concerned with CLABSI and PICC removed, but blood cultures now negative.  TF was advanced to 40 ml/hr on 8/30, then abdomen became distended.  TF reduced to 20 ml/hr and then held.  Intolerant to gastric feeding, so feeding via Jtube starting 8/31.  Glucose / Insulin : A1c 5.2 - CBGs controlled Used 22 units SSI in past 24 hrs and 12 units TF coverage  Electrolytes: Na down to 134, K 3.9 (goal >/= 4 for ileus) and Phos up to 3 post KPhos 15mmol IV and KCL x 2 runs (been getting KPhos repletion daily for several days), low CO2 (max acetate in TPN), others WNL Renal: SCr < 1, BUN WNL Hepatic: LFTs WNL except alk phos 354, tbili normalized (no jaundice), TG normalized, albumin 1.8 Intake / Output; MIVF: UOP 1.6 ml/kg/min, drain Reglan  10 q8h, pyridostigmine ; LBM 8/30 GI Imaging: 8/24: CT abd suspicious for acute cholecystitis GI Surgeries / Procedures:  8/24 cholecyststomy tube insertion Central access: use port per CCM (PICC removed for CLABSI concern 02/28/24) TPN start date: Year 2023   RD Estimated Needs Total Energy Estimated Needs: 1400-1600 kcal/d Total Protein Estimated Needs: 80-95 g/d Total Fluid Estimated Needs: 1.5-1.8L/d *RD reduced on 8/27  TPN Formula PTA:   [facility] pharmacist: Eva [phone]  Infusion Duration: 18hr Total Kcal: 1331 on lipids  days; 931 on non lipid days  Kcal calculations for TPN PTA based on previous weight in 2023   Protein: 80g Dextrose : 180g  Lipids: 40g (no lipids Tues/Wed/Thurs/Sat/Sun d/t elevated TG) Magnesium : 20 mEq Potassium: 180 mEq Phosphate: 37 mmol Acetate: 280.61 mEq Cl: 0 mEq **patient has been requiring less K, more Phos and usual amount of lipids without raising TG in the hospital  Current Nutrition:  TPN Osmolite 1.2 at 50 ml/hr via J-tube  = goal rate- tolerating Prosource 60mL daily  Plan:  Stopping TPN today with toleration of tube feeds at goal rate. RN to reduce to half rate for last 2 hours of TPN then stop.  Discontinuing TPN lab orders.  Continue resistant SSI Q4H and TF coverage 5 units every 4 hours. Monitor to reduce as come off TPN.  Pharmacy will sign off consult.  Please re-consult if needed.   Harlene Boga, PharmD, BCPS, BCCCP Clinical Pharmacist Please refer to [facility] for [facility] Pharmacy numbers 03/06/2024, 7:11 AM

## 2024-03-07 DIAGNOSIS — Z9911 Dependence on respirator [ventilator] status: Secondary | ICD-10-CM

## 2024-03-07 DIAGNOSIS — G1221 Amyotrophic lateral sclerosis: Secondary | ICD-10-CM | POA: Diagnosis not present

## 2024-03-07 DIAGNOSIS — K810 Acute cholecystitis: Secondary | ICD-10-CM | POA: Diagnosis not present

## 2024-03-07 DIAGNOSIS — J9611 Chronic respiratory failure with hypoxia: Secondary | ICD-10-CM | POA: Diagnosis not present

## 2024-03-07 LAB — COMPREHENSIVE METABOLIC PANEL WITH GFR
ALT: 48 U/L — ABNORMAL HIGH (ref 0–44)
AST: 37 U/L (ref 15–41)
Albumin: 2.5 g/dL — ABNORMAL LOW (ref 3.5–5.0)
Alkaline Phosphatase: 414 U/L — ABNORMAL HIGH (ref 38–126)
Anion gap: 9 (ref 5–15)
BUN: 12 mg/dL (ref 6–20)
CO2: 20 mmol/L — ABNORMAL LOW (ref 22–32)
Calcium: 9.2 mg/dL (ref 8.9–10.3)
Chloride: 113 mmol/L — ABNORMAL HIGH (ref 98–111)
Creatinine, Ser: <0.3 mg/dL — ABNORMAL LOW (ref 0.61–1.24)
Glucose, Bld: 129 mg/dL — ABNORMAL HIGH (ref 70–99)
Potassium: 3.6 mmol/L (ref 3.5–5.1)
Sodium: 142 mmol/L (ref 135–145)
Total Bilirubin: 1.1 mg/dL (ref 0.0–1.2)
Total Protein: 7.4 g/dL (ref 6.5–8.1)

## 2024-03-07 LAB — GLUCOSE, CAPILLARY
Glucose-Capillary: 118 mg/dL — ABNORMAL HIGH (ref 70–99)
Glucose-Capillary: 122 mg/dL — ABNORMAL HIGH (ref 70–99)
Glucose-Capillary: 134 mg/dL — ABNORMAL HIGH (ref 70–99)
Glucose-Capillary: 117 mg/dL — ABNORMAL HIGH (ref 70–99)
Glucose-Capillary: 150 mg/dL — ABNORMAL HIGH (ref 70–99)
Glucose-Capillary: 136 mg/dL — ABNORMAL HIGH (ref 70–99)

## 2024-03-07 LAB — CBC
HCT: 26 % — ABNORMAL LOW (ref 39.0–52.0)
Hemoglobin: 8 g/dL — ABNORMAL LOW (ref 13.0–17.0)
MCH: 26.9 pg (ref 26.0–34.0)
MCHC: 30.8 g/dL (ref 30.0–36.0)
MCV: 87.5 fL (ref 80.0–100.0)
Platelets: 455 K/uL — ABNORMAL HIGH (ref 150–400)
RBC: 2.97 MIL/uL — ABNORMAL LOW (ref 4.22–5.81)
RDW: 17.3 % — ABNORMAL HIGH (ref 11.5–15.5)
WBC: 13.7 K/uL — ABNORMAL HIGH (ref 4.0–10.5)
nRBC: 0.1 % (ref 0.0–0.2)

## 2024-03-07 MED ORDER — BISACODYL 10 MG RE SUPP
10.0000 mg | Freq: Once | RECTAL | Status: AC
  Administered 2024-03-07: 10 mg via RECTAL
  Filled 2024-03-07: qty 1

## 2024-03-07 NOTE — Progress Notes (Signed)
 Progress Note   Patient: Jerry [name] FMW:[id number] DOB: [birth year] DOA: 02/26/2024     10 DOS: the patient was seen and examined on 03/07/2024   Brief hospital course: Swayze Pries is a 53 year old male with advanced ALS, status post trach, vent dependent/ PEG, but on TPN as unable to tolerate tube feeds, brought to the emergency department with complaint of abdominal pain, nausea and vomiting since 1 day. Per wife he had 3 episodes of nonbilious vomiting. On workup he was noted to have possible acute cholecystitis, seen by general surgery recommend percutaneous cholecystostomy tube drainage. Cholecystostomy tube place by IR 02/26/24, continued on Zosyn  therapy. G tube to GJ conversion done by IR 8/28. PCCM on board for ventilator management. Patient tolerating tube feeds. He is on TPN wean down. Transferred to [facility] service 03/06/24.   Assessment and Plan: Sepsis Acute cholecystitis S/p percutaneous cholecystostomy tube 8/24: Finished Zosyn  total 10 days. Follow chole tube output.  IR follow up for tube management.  Chronic hypoxic respiratory failure S/p trach, Vent dependant PCCM follow up for vent management appreciated. Continue ventilator support, wean O2 as able. Continue tracheostomy care. Respiratory cultures possible colonization, no signs of infection. Follow pulmonary recommendations.  Advanced ALS Dysphagia s/p PEG Transitioned to GJ tube by IR 8/28. Wean off TPN. Able tolerate tube feeds. Communication thru eye tracking system.  At risk for malnutrition Continue tube feeds, supplements Dietician on board. Continue constipation regimen, bisacodyl  suppository ordered     Out of bed to chair. Incentive spirometry. Nursing supportive care. Fall, aspiration precautions. Diet:  Diet Orders (From admission, onward)     Start     Ordered   03/01/24 0001  Diet NPO time specified  Diet effective midnight        02/29/24 0957           DVT prophylaxis: heparin   injection 5,000 Units Start: 02/26/24 1715 SCDs Start: 02/26/24 1537  Level of care: ICU   Code Status: Full Code  Subjective: Patient is seen and examined today morning. He feels fine, communicates with his eye tracking system. Does have constipation. Tolerating feeds well. No family at bedside.  Physical Exam: Vitals:   03/07/24 1000 03/07/24 1100 03/07/24 1113 03/07/24 1200  BP: 122/76 116/81  127/71  Pulse: 93 82  87  Resp: 16 16  16   Temp:   98.2 F (36.8 C)   TempSrc:   Oral   SpO2: 100% 100%  100%  Weight:      Height:        General -  Middle aged African American male, no apparent distress HEENT - PERRLA, EOMI, atraumatic head, non tender sinuses. Lung - Clear, diffuse rales, s/p trach to vent. Heart - S1, S2 heard, no murmurs, rubs, trace pedal edema. Abdomen - Soft, non tender, RUQ drain, GJ tube noted. Neuro - Alert, awake and oriented, paraplegia due to ALS. Skin - Warm and dry.  Data Reviewed:      Latest Ref Rng & Units 03/01/2024    9:50 AM 02/29/2024    4:36 AM 02/28/2024    3:07 AM  CBC  WBC 4.0 - 10.5 K/uL 11.3  15.0  22.3   Hemoglobin 13.0 - 17.0 g/dL 8.2  8.6  89.2   Hematocrit 39.0 - 52.0 % 24.9  26.4  32.7   Platelets 150 - 400 K/uL 249  228  239       Latest Ref Rng & Units 03/05/2024    3:01 AM 03/04/2024  4:58 AM 03/03/2024    3:41 AM  BMP  Glucose 70 - 99 mg/dL 868  816  803   BUN 6 - 20 mg/dL 10  10  10    Creatinine 0.61 - 1.24 mg/dL <9.69  <9.69  <9.69   Sodium 135 - 145 mmol/L 134  138  135   Potassium 3.5 - 5.1 mmol/L 3.9  3.7  4.0   Chloride 98 - 111 mmol/L 104  107  104   CO2 22 - 32 mmol/L 21  22  22    Calcium  8.9 - 10.3 mg/dL 8.6  8.9  8.5    No results found.  Family Communication: Discussed with patient, he understand and agree. All questions answered.  Disposition: Status is: Inpatient Remains inpatient appropriate because: severity of illness  Planned Discharge Destination: Home with Home Health     Time spent:  45 minutes  Author: Concepcion Riser, MD 03/07/2024 1:05 PM Secure chat 7am to 7pm For on call review [email or web address].

## 2024-03-08 ENCOUNTER — Inpatient Hospital Stay (HOSPITAL_COMMUNITY)

## 2024-03-08 DIAGNOSIS — G1221 Amyotrophic lateral sclerosis: Secondary | ICD-10-CM | POA: Diagnosis not present

## 2024-03-08 DIAGNOSIS — Z9911 Dependence on respirator [ventilator] status: Secondary | ICD-10-CM | POA: Diagnosis not present

## 2024-03-08 DIAGNOSIS — K810 Acute cholecystitis: Secondary | ICD-10-CM | POA: Diagnosis not present

## 2024-03-08 DIAGNOSIS — J9611 Chronic respiratory failure with hypoxia: Secondary | ICD-10-CM | POA: Diagnosis not present

## 2024-03-08 LAB — COMPREHENSIVE METABOLIC PANEL WITH GFR
ALT: 44 U/L (ref 0–44)
AST: 31 U/L (ref 15–41)
Albumin: 2.6 g/dL — ABNORMAL LOW (ref 3.5–5.0)
Alkaline Phosphatase: 416 U/L — ABNORMAL HIGH (ref 38–126)
Anion gap: 12 (ref 5–15)
BUN: 9 mg/dL (ref 6–20)
CO2: 19 mmol/L — ABNORMAL LOW (ref 22–32)
Calcium: 9.2 mg/dL (ref 8.9–10.3)
Chloride: 112 mmol/L — ABNORMAL HIGH (ref 98–111)
Creatinine, Ser: <0.3 mg/dL — ABNORMAL LOW (ref 0.61–1.24)
Glucose, Bld: 106 mg/dL — ABNORMAL HIGH (ref 70–99)
Potassium: 3.8 mmol/L (ref 3.5–5.1)
Sodium: 143 mmol/L (ref 135–145)
Total Bilirubin: 1.3 mg/dL — ABNORMAL HIGH (ref 0.0–1.2)
Total Protein: 7.4 g/dL (ref 6.5–8.1)

## 2024-03-08 LAB — CBC
HCT: 27.1 % — ABNORMAL LOW (ref 39.0–52.0)
Hemoglobin: 8.2 g/dL — ABNORMAL LOW (ref 13.0–17.0)
MCH: 27.2 pg (ref 26.0–34.0)
MCHC: 30.3 g/dL (ref 30.0–36.0)
MCV: 89.7 fL (ref 80.0–100.0)
Platelets: 364 K/uL (ref 150–400)
RBC: 3.02 MIL/uL — ABNORMAL LOW (ref 4.22–5.81)
RDW: 17.6 % — ABNORMAL HIGH (ref 11.5–15.5)
WBC: 13.2 K/uL — ABNORMAL HIGH (ref 4.0–10.5)
nRBC: 0 % (ref 0.0–0.2)

## 2024-03-08 LAB — GLUCOSE, CAPILLARY
Glucose-Capillary: 103 mg/dL — ABNORMAL HIGH (ref 70–99)
Glucose-Capillary: 122 mg/dL — ABNORMAL HIGH (ref 70–99)
Glucose-Capillary: 126 mg/dL — ABNORMAL HIGH (ref 70–99)
Glucose-Capillary: 123 mg/dL — ABNORMAL HIGH (ref 70–99)
Glucose-Capillary: 132 mg/dL — ABNORMAL HIGH (ref 70–99)
Glucose-Capillary: 116 mg/dL — ABNORMAL HIGH (ref 70–99)

## 2024-03-08 LAB — SUSCEPTIBILITY, AER + ANAEROB

## 2024-03-08 LAB — SUSCEPTIBILITY RESULT

## 2024-03-08 NOTE — Progress Notes (Signed)
 [facility] Liaison Note [facility] continues to follow for discharge disposition for outpatient palliative services at home.   Please call with any hospice related questions or concerns.   Inocente Jacobs, RN, [employer]  [phone]

## 2024-03-08 NOTE — Progress Notes (Signed)
 Referring Provider(s): Dr. LITTIE Bureau  Supervising Physician: [name]  Patient Status:  [name] - In-pt  Chief Complaint: Acute cholecystitis s/p percutaneous cholecystostomy on 8/26 with Dr. [name]  Brief History:  53 year old male with a history of ALS, DM, trach dependency, long term TPN use (not tolerating tube feeds w/ L arm PICC) who presented to the ED for abdominal pain with associated nausea and vomiting on 8/24. In the ED, imaging was concerning for acute cholecystitis, patient was febrile and tachycardic, and T bili was noted to have increased from 2.6 to 3.8. He was evaluated by GI who recommended percutaneous cholecystostomy which patient underwent on 8/26.   Subjective:  Patient resting in bed with nurse at the bedside. Able to use speech-generating device to discuss drain. Denies any pain at drain site, issues with drain, or concerns at this time. All questions answered at the bedside.   Allergies: Crab [shellfish allergy]  Medications: Prior to Admission medications   Medication Sig Start Date End Date Taking? Authorizing Provider  cetirizine  HCl (ZYRTEC ) 5 MG/5ML SOLN Take 10 mLs (10 mg total) by mouth at bedtime. 07/27/23  Yes Sowles, Krichna, MD  glycopyrrolate  (ROBINUL ) 1 MG tablet Place 1 tablet (1 mg total) into feeding tube 2 (two) times daily as needed. Patient taking differently: Place 1 mg into feeding tube 2 (two) times daily. 07/27/23  Yes Sowles, Krichna, MD  ipratropium-albuterol  (DUONEB) 0.5-2.5 (3) MG/3ML SOLN USE 3 ML VIA NEBULIZER EVERY 4 HOURS AS NEEDED Patient taking differently: Inhale 3 mLs into the lungs every 4 (four) hours as needed. 11/04/23  Yes Sowles, Krichna, MD  polyethylene glycol powder (GLYCOLAX /MIRALAX ) 17 GM/SCOOP powder Place 17 g into feeding tube daily. Titrate as needed for constipation 07/27/23  Yes Sowles, Krichna, MD  Polyvinyl Alcohol -Povidone (CLEAR EYES NATURAL TEARS) 5-6 MG/ML SOLN Apply 1 drop to eye 3 (three) times  daily as needed. 07/27/23  Yes Sowles, Krichna, MD  Potassium Chloride  40 MEQ/15ML (20%) SOLN TAKE 15 ML BY MOUTH TWICE DAILY 08/26/23  Yes Sowles, Krichna, MD  PRESCRIPTION MEDICATION Home TPN   Yes [provider]  scopolamine  (TRANSDERM-SCOP) 1 MG/3DAYS PLACE 1 PATCH ONTO THE SKIN EVERY 3 DAYS 08/18/23  Yes Sowles, Krichna, MD  triamcinolone  cream (KENALOG ) 0.1 % Apply 1 Application topically 2 (two) times daily. Patient not taking: Reported on 02/26/2024 07/27/23   Jerry Mire, MD     Vital Signs: BP 128/79   Pulse (!) 113   Temp 98.9 F (37.2 C) (Axillary)   Resp 19   Ht 5' 5 (1.651 m)   Wt 164 lb 7.4 oz (74.6 kg)   SpO2 100%   BMI 27.37 kg/m   Physical Exam Vitals reviewed.  Constitutional:      Appearance: He is well-developed.  Cardiovascular:     Rate and Rhythm: Normal rate and regular rhythm.  Pulmonary:     Effort: Pulmonary effort is normal.  Abdominal:     General: Abdomen is flat.     Palpations: Abdomen is soft.     Tenderness: There is abdominal tenderness (minimal; no tenderness around drain site).     Comments: RUQ drain sutured in place w/ small amount of bilious drainage noted around the insertion site; Overlying bandage with small amount of bilious drainage. Insertion site is non-erythematous. ~5cc bilious output in bag. Flushes/aspirates easily  Skin:    General: Skin is warm and dry.  Neurological:     Mental Status: He is alert.  Drain Location: RUQ Size: Fr size: 10 Fr Date of placement: 02/28/24  Currently to: Drain collection device: gravity 24 hour output:  Output by Drain (mL) 03/06/24 0701 - 03/06/24 1900 03/06/24 1901 - 03/07/24 0700 03/07/24 0701 - 03/07/24 1900 03/07/24 1901 - 03/08/24 0700 03/08/24 0701 - 03/08/24 1455  Closed System Drain 1 Lateral RLQ Other (Comment) 10 Fr.    20   Gastrostomy/Enterostomy Gastrostomy;Jejunostomy 24 Fr. LUQ  750 700 250     Interval imaging/drain manipulation:  DG Abd 1 View -  03/03/24  IMPRESSION: New dilated air-filled small bowel loops throughout the abdomen concerning for small bowel obstruction.  Current examination: Flushes/aspirates easily.  Insertion site unremarkable. Suture and stat lock in place. Dressed appropriately.    Labs:  CBC: Recent Labs    02/29/24 0436 03/01/24 0950 03/07/24 0500 03/08/24 0356  WBC 15.0* 11.3* 13.7* 13.2*  HGB 8.6* 8.2* 8.0* 8.2*  HCT 26.4* 24.9* 26.0* 27.1*  PLT 228 249 455* 364    COAGS: Recent Labs    02/27/24 0441  INR 1.4*    BMP: Recent Labs    11/11/23 0000 02/26/24 0424 02/26/24 0703 02/26/24 0726 03/04/24 0458 03/05/24 0301 03/07/24 0500 03/08/24 0356  NA  --    < > DUP   < > 138 134* 142 143  K  --    < > DUP   < > 3.7 3.9 3.6 3.8  CL  --    < > DUP   < > 107 104 113* 112*  CO2  --    < > DUP   < > 22 21* 20* 19*  GLUCOSE  --    < > DUP   < > 183* 131* 129* 106*  BUN  --    < > DUP   < > 10 10 12 9   CALCIUM   --    < > DUP   < > 8.9 8.6* 9.2 9.2  CREATININE  --    < > DUP   < > <0.30* <0.30* <0.30* <0.30*  GFRNONAA  --    < > DUP   < > NOT CALCULATED NOT CALCULATED NOT CALCULATED NOT CALCULATED  GFRAA 160  --  DUP  --   --   --   --   --    < > = values in this interval not displayed.    LIVER FUNCTION TESTS: Recent Labs    03/01/24 9356 03/02/24 0748 03/05/24 0301 03/07/24 0500 03/08/24 0356  BILITOT 2.3*  --  1.2 1.1 1.3*  AST 31  --  31 37 31  ALT 46*  --  36 48* 44  ALKPHOS 249*  --  354* 414* 416*  PROT 6.5  --  6.5 7.4 7.4  ALBUMIN 2.2* 2.2* 1.8* 2.5* 2.6*    Assessment and Plan:  Acute cholecystitis: Jerry [name] is a 54 y.o. male with a history of ALS and trach dependency who was found to have acute cholecystitis and underwent percutaneous cholecystostomy placement on 8/26. Seen today for drain follow up.  -Unclear if patient is candidate for cholecystectomy; no specifications from Surgery who have signed off -Discussed with patient likely follow up as an  outpatient for routine exchanges with the possibility of removal in the future -Patient verbalized understanding   While Inpatient: Continue daily flushes with 5-10 cc NS. Record output Q shift. Dressing changes QD or PRN if soiled.  Call IR APP or on call IR MD if difficulty flushing or sudden  change in drain output.   Discharge planning: Please contact IR APP or on call IR MD prior to patient d/c to ensure appropriate follow up plans are in place. Typically patient will follow up with IR in 6-8 weeks for routine exchange and possible drain injection. IR scheduler will contact patient with date/time of appointment. Patient will need to flush drain QD with 5 cc NS, record output QD, dressing changes every 2-3 days or earlier if soiled.   IR will continue to follow - please call with questions or concerns.   Thank you for allowing our service to participate in Red Mandt 's care.   Electronically Signed: Avice Funchess M Timohty Renbarger, PA-C 03/08/2024, 2:49 PM    I spent a total of 25 Minutes at the the patient's bedside AND on the patient's hospital floor or unit, greater than 50% of which was counseling/coordinating care for percutaneous cholecystostomy drain follow up.

## 2024-03-08 NOTE — Progress Notes (Signed)
 Progress Note   Patient: Jerry [name] FMW:[id number] DOB: [birth year] DOA: 02/26/2024     11 DOS: the patient was seen and examined on 03/08/2024   Brief hospital course: Jerry [name] is a 53 year old male with advanced ALS, status post trach, vent dependent/ PEG, but on TPN as unable to tolerate tube feeds, brought to the emergency department with complaint of abdominal pain, nausea and vomiting since 1 day. Per wife he had 3 episodes of nonbilious vomiting. On workup he was noted to have possible acute cholecystitis, seen by general surgery recommend percutaneous cholecystostomy tube drainage. Cholecystostomy tube place by IR 02/26/24, continued on Zosyn  therapy. G tube to GJ conversion done by IR 8/28. PCCM on board for ventilator management. Patient tolerating tube feeds. He is on TPN wean down. Transferred to [facility] service 03/06/24.   Assessment and Plan: Sepsis Acute cholecystitis S/p percutaneous cholecystostomy tube 8/24: Finished Zosyn  total 10 days. Follow chole tube output.  IR follow up for tube management.  Chronic hypoxic respiratory failure S/p trach, Vent dependant PCCM follow up for vent management appreciated. Continue ventilator support, wean O2 as able. Continue tracheostomy care. Respiratory cultures possible colonization, no signs of infection. Follow pulmonary recommendations.  Advanced ALS Dysphagia s/p PEG Transitioned to GJ tube by IR 8/28. Off TPN now. Able tolerate tube feeds. Feeds, meds thru J tube, suction at G tube. Continue scheduled constipation regimen, enema as needed.  At risk for malnutrition Continue tube feeds, supplements Dietician on board. Continue constipation regimen, bisacodyl  suppository ordered     Out of bed to chair. Incentive spirometry. Nursing supportive care. Fall, aspiration precautions. Diet:  Diet Orders (From admission, onward)     Start     Ordered   03/01/24 0001  Diet NPO time specified  Diet effective midnight         02/29/24 0957           DVT prophylaxis: heparin  injection 5,000 Units Start: 02/26/24 1715 SCDs Start: 02/26/24 1537  Level of care: ICU   Code Status: Full Code  Subjective: Patient is seen and examined today morning. He has 3 loose stools. Wife at bedside, concerned about bowel function. Tolerating feeds well.   Physical Exam: Vitals:   03/08/24 1000 03/08/24 1140 03/08/24 1208 03/08/24 1546  BP: (!) 141/74 128/79    Pulse: (!) 118 (!) 113    Resp: (!) 21 19    Temp:   98.9 F (37.2 C) 99.1 F (37.3 C)  TempSrc:   Axillary Axillary  SpO2: 100% 100%    Weight:      Height:        General -  Middle aged African American male, no apparent distress HEENT - PERRLA, EOMI, atraumatic head, non tender sinuses. Lung - Clear, diffuse rales, s/p trach to vent. Heart - S1, S2 heard, no murmurs, rubs, trace pedal edema. Abdomen - Soft, non tender, distended, RUQ drain, GJ tube noted. Neuro - Alert, awake and oriented, paraplegia due to ALS. Skin - Warm and dry.  Data Reviewed:      Latest Ref Rng & Units 03/08/2024    3:56 AM 03/07/2024    5:00 AM 03/01/2024    9:50 AM  CBC  WBC 4.0 - 10.5 K/uL 13.2  13.7  11.3   Hemoglobin 13.0 - 17.0 g/dL 8.2  8.0  8.2   Hematocrit 39.0 - 52.0 % 27.1  26.0  24.9   Platelets 150 - 400 K/uL 364  455  249  Latest Ref Rng & Units 03/08/2024    3:56 AM 03/07/2024    5:00 AM 03/05/2024    3:01 AM  BMP  Glucose 70 - 99 mg/dL 893  870  868   BUN 6 - 20 mg/dL 9  12  10    Creatinine 0.61 - 1.24 mg/dL <9.69  <9.69  <9.69   Sodium 135 - 145 mmol/L 143  142  134   Potassium 3.5 - 5.1 mmol/L 3.8  3.6  3.9   Chloride 98 - 111 mmol/L 112  113  104   CO2 22 - 32 mmol/L 19  20  21    Calcium  8.9 - 10.3 mg/dL 9.2  9.2  8.6    No results found.  Family Communication: Discussed with patient, wife. They understand and agree. All questions answered.  Disposition: Status is: Inpatient Remains inpatient appropriate because: severity of illness   Planned Discharge Destination: Home with Home Health     Time spent: 47 minutes  Author: Concepcion Riser, MD 03/08/2024 4:14 PM Secure chat 7am to 7pm For on call review [email or web address].

## 2024-03-09 DIAGNOSIS — Z9911 Dependence on respirator [ventilator] status: Secondary | ICD-10-CM | POA: Diagnosis not present

## 2024-03-09 DIAGNOSIS — K810 Acute cholecystitis: Secondary | ICD-10-CM | POA: Diagnosis not present

## 2024-03-09 DIAGNOSIS — G1221 Amyotrophic lateral sclerosis: Secondary | ICD-10-CM | POA: Diagnosis not present

## 2024-03-09 DIAGNOSIS — J9611 Chronic respiratory failure with hypoxia: Secondary | ICD-10-CM | POA: Diagnosis not present

## 2024-03-09 LAB — COMPREHENSIVE METABOLIC PANEL WITH GFR
ALT: 40 U/L (ref 0–44)
AST: 29 U/L (ref 15–41)
Albumin: 2.7 g/dL — ABNORMAL LOW (ref 3.5–5.0)
Alkaline Phosphatase: 362 U/L — ABNORMAL HIGH (ref 38–126)
Anion gap: 8 (ref 5–15)
BUN: 13 mg/dL (ref 6–20)
CO2: 20 mmol/L — ABNORMAL LOW (ref 22–32)
Calcium: 9.2 mg/dL (ref 8.9–10.3)
Chloride: 115 mmol/L — ABNORMAL HIGH (ref 98–111)
Creatinine, Ser: <0.3 mg/dL — ABNORMAL LOW (ref 0.61–1.24)
Glucose, Bld: 138 mg/dL — ABNORMAL HIGH (ref 70–99)
Potassium: 3.9 mmol/L (ref 3.5–5.1)
Sodium: 143 mmol/L (ref 135–145)
Total Bilirubin: 1.2 mg/dL (ref 0.0–1.2)
Total Protein: 7.5 g/dL (ref 6.5–8.1)

## 2024-03-09 LAB — GLUCOSE, CAPILLARY
Glucose-Capillary: 129 mg/dL — ABNORMAL HIGH (ref 70–99)
Glucose-Capillary: 137 mg/dL — ABNORMAL HIGH (ref 70–99)
Glucose-Capillary: 136 mg/dL — ABNORMAL HIGH (ref 70–99)
Glucose-Capillary: 132 mg/dL — ABNORMAL HIGH (ref 70–99)
Glucose-Capillary: 128 mg/dL — ABNORMAL HIGH (ref 70–99)
Glucose-Capillary: 124 mg/dL — ABNORMAL HIGH (ref 70–99)

## 2024-03-09 LAB — CBC
HCT: 25.4 % — ABNORMAL LOW (ref 39.0–52.0)
Hemoglobin: 7.9 g/dL — ABNORMAL LOW (ref 13.0–17.0)
MCH: 27.3 pg (ref 26.0–34.0)
MCHC: 31.1 g/dL (ref 30.0–36.0)
MCV: 87.9 fL (ref 80.0–100.0)
Platelets: 449 K/uL — ABNORMAL HIGH (ref 150–400)
RBC: 2.89 MIL/uL — ABNORMAL LOW (ref 4.22–5.81)
RDW: 17.5 % — ABNORMAL HIGH (ref 11.5–15.5)
WBC: 13.2 K/uL — ABNORMAL HIGH (ref 4.0–10.5)
nRBC: 0.2 % (ref 0.0–0.2)

## 2024-03-09 NOTE — Discharge Instructions (Signed)
 Interventional Radiology Percutaneous Abscess Drain Placement After Care   This sheet gives you information about how to care for yourself after your procedure. Your health care provider may also give you more specific instructions. Your drain was placed by an interventional radiologist with [facility]. If you have questions or concerns, contact [facility] at [phone].   Check your insertion site when you change the bandage. Check for:  More redness, swelling, or pain.  More fluid or blood.  Warmth.  Pus or a bad smell.   Catheter care  Flush the catheter once per day with 5 mL of 0.9% normal saline unless you are told otherwise by your healthcare provider. This helps to prevent clogs in the catheter.  To disconnect the drain, turn the clear plastic tube to the left. Attach the saline syringe by placing it on the white end of the drain and turning gently to the right. Once attached gently push the plunger to the 5 mL mark. After you are done flushing, disconnect the syringe by turning to the left and reattach your drainage container  If you have a bulb please be sure the bulb is charged after reconnecting it - to do this pinch the bulb between your thumb and first finger and close the stopper located on the top of the bulb.   Check for fluid leaking from around your catheter (instead of fluid draining through your catheter). This may be a sign that the drain is no longer working correctly.  Write down the following information every time you empty your bag:   The date and time.   The amount of drainage.    Get help right away if:  Your catheter comes out.  You suddenly stop having drainage from your catheter.  You suddenly have blood in the fluid that is draining from your catheter.  You become dizzy or you faint.  You develop a rash.  You have nausea or vomiting.  You have difficulty breathing or you feel short of breath.  You develop chest pain.  You have  problems with your speech or vision.  You have trouble balancing or moving your arms or legs.    Interventional Radiology Drain Record   Empty your drain at least once per day. You may empty it as often as needed. Use this form to write down the amount of fluid that has collected in the drainage container. Bring this form with you to your follow-up visits. Please call [facility] at [phone] with any questions or concerns prior to your appointment.   Drain #1 location: ____Right upper abdomen____   Date __________ Time __________ Amount __________   Date __________ Time __________ Amount __________   Date __________ Time __________ Amount __________   Date __________ Time __________ Amount __________   Date __________ Time __________ Amount __________   Date __________ Time __________ Amount __________   Date __________ Time __________ Amount __________   Date __________ Time __________ Amount __________   Date __________ Time __________ Amount __________   Date __________ Time __________ Amount __________   Date __________ Time __________ Amount __________   Date __________ Time __________ Amount __________   Date __________ Time __________ Amount __________   Date __________ Time __________ Amount __________

## 2024-03-09 NOTE — Progress Notes (Signed)
 Referring Provider(s): Dr. LITTIE Bureau  Supervising Physician: Dr. Thom Hall  Patient Status:  [facility] - In-pt  Chief Complaint: Acute cholecystitis s/p percutaneous cholecystostomy on 8/26 with Dr. [name]  Subjective: Patient resting in bed.  Converses with speech-generating device.  RUQ drain in place without issue.    Allergies: Crab [shellfish allergy]  Medications: Prior to Admission medications   Medication Sig Start Date End Date Taking? Authorizing Provider  cetirizine  HCl (ZYRTEC ) 5 MG/5ML SOLN Take 10 mLs (10 mg total) by mouth at bedtime. 07/27/23  Yes Sowles, Krichna, MD  glycopyrrolate  (ROBINUL ) 1 MG tablet Place 1 tablet (1 mg total) into feeding tube 2 (two) times daily as needed. Patient taking differently: Place 1 mg into feeding tube 2 (two) times daily. 07/27/23  Yes Sowles, Krichna, MD  ipratropium-albuterol  (DUONEB) 0.5-2.5 (3) MG/3ML SOLN USE 3 ML VIA NEBULIZER EVERY 4 HOURS AS NEEDED Patient taking differently: Inhale 3 mLs into the lungs every 4 (four) hours as needed. 11/04/23  Yes Sowles, Krichna, MD  polyethylene glycol powder (GLYCOLAX /MIRALAX ) 17 GM/SCOOP powder Place 17 g into feeding tube daily. Titrate as needed for constipation 07/27/23  Yes Sowles, Krichna, MD  Polyvinyl Alcohol -Povidone (CLEAR EYES NATURAL TEARS) 5-6 MG/ML SOLN Apply 1 drop to eye 3 (three) times daily as needed. 07/27/23  Yes Sowles, Krichna, MD  Potassium Chloride  40 MEQ/15ML (20%) SOLN TAKE 15 ML BY MOUTH TWICE DAILY 08/26/23  Yes Sowles, Krichna, MD  PRESCRIPTION MEDICATION Home TPN   Yes [provider]  scopolamine  (TRANSDERM-SCOP) 1 MG/3DAYS PLACE 1 PATCH ONTO THE SKIN EVERY 3 DAYS 08/18/23  Yes Sowles, Krichna, MD  triamcinolone  cream (KENALOG ) 0.1 % Apply 1 Application topically 2 (two) times daily. Patient not taking: Reported on 02/26/2024 07/27/23   [name], MD     Vital Signs: BP 125/79   Pulse 94   Temp 98.7 F (37.1 C) (Oral)   Resp 16   Ht 5' 5  (1.651 m)   Wt 165 lb 2 oz (74.9 kg)   SpO2 100%   BMI 27.48 kg/m   Physical Exam Vitals reviewed.  Constitutional:      Appearance: He is well-developed.  Cardiovascular:     Rate and Rhythm: Normal rate and regular rhythm.  Pulmonary:     Effort: Pulmonary effort is normal.  Abdominal:     General: Abdomen is flat.     Palpations: Abdomen is soft.     Tenderness: There is no abdominal tenderness (minimal; no tenderness around drain site).     Comments: RUQ drain in place.  Insertion site inaccessible, however drain hub and gravity bag within view and appear intact with adequate dark, bilious drainage.  Was able to flush the tubing without resistance.   Skin:    General: Skin is warm and dry.  Neurological:     Mental Status: He is alert.     Labs:  CBC: Recent Labs    03/01/24 0950 03/07/24 0500 03/08/24 0356 03/09/24 0404  WBC 11.3* 13.7* 13.2* 13.2*  HGB 8.2* 8.0* 8.2* 7.9*  HCT 24.9* 26.0* 27.1* 25.4*  PLT 249 455* 364 449*    COAGS: Recent Labs    02/27/24 0441  INR 1.4*    BMP: Recent Labs    11/11/23 0000 02/26/24 0424 02/26/24 0703 02/26/24 0726 03/05/24 0301 03/07/24 0500 03/08/24 0356 03/09/24 0404  NA  --    < > DUP   < > 134* 142 143 143  K  --    < >  DUP   < > 3.9 3.6 3.8 3.9  CL  --    < > DUP   < > 104 113* 112* 115*  CO2  --    < > DUP   < > 21* 20* 19* 20*  GLUCOSE  --    < > DUP   < > 131* 129* 106* 138*  BUN  --    < > DUP   < > 10 12 9 13   CALCIUM   --    < > DUP   < > 8.6* 9.2 9.2 9.2  CREATININE  --    < > DUP   < > <0.30* <0.30* <0.30* <0.30*  GFRNONAA  --    < > DUP   < > NOT CALCULATED NOT CALCULATED NOT CALCULATED NOT CALCULATED  GFRAA 160  --  DUP  --   --   --   --   --    < > = values in this interval not displayed.    LIVER FUNCTION TESTS: Recent Labs    03/05/24 0301 03/07/24 0500 03/08/24 0356 03/09/24 0404  BILITOT 1.2 1.1 1.3* 1.2  AST 31 37 31 29  ALT 36 48* 44 40  ALKPHOS 354* 414* 416* 362*  PROT  6.5 7.4 7.4 7.5  ALBUMIN 1.8* 2.5* 2.6* 2.7*    Assessment and Plan:  Acute cholecystitis s/p cholecystostomy tube placement RUQ drain in place without issue.   Continue daily flushes with 5-10 cc NS. Record output Q shift. Dressing changes QD or PRN if soiled.    Discharge planning: Will place orders for cholangiogram in 6-8 weeks.  Further planning on longevity of tube vs. Definitive management to be determined.   IR will continue to follow - please call with questions or concerns.   Thank you for allowing our service to participate in Jerry [name] 's care.   Electronically Signed: Norbert Malkin Sue-Ellen Tesha Archambeau, PA 03/09/2024, 11:11 AM    I spent a total of 15 Minutes at the the patient's bedside AND on the patient's hospital floor or unit, greater than 50% of which was counseling/coordinating care for acute cholecystitis.

## 2024-03-09 NOTE — Progress Notes (Addendum)
 Progress Note   Patient: Jerry [name] FMW:[id number] DOB: 1971-01-28 DOA: 02/26/2024     12 DOS: the patient was seen and examined on 03/09/2024   Brief hospital course: Jerry [name] is a 53 year old male with advanced ALS, status post trach, vent dependent/ PEG, but on TPN as unable to tolerate tube feeds, brought to the emergency department with complaint of abdominal pain, nausea and vomiting since 1 day. Per wife he had 3 episodes of nonbilious vomiting. On workup he was noted to have possible acute cholecystitis, seen by general surgery recommend percutaneous cholecystostomy tube drainage. Cholecystostomy tube place by IR 02/26/24, continued on Zosyn  therapy. G tube to GJ conversion done by IR 8/28. PCCM on board for ventilator management. Patient tolerating tube feeds. He is on TPN wean down. Transferred to [facility] service 03/06/24.   Assessment and Plan: Sepsis Acute cholecystitis S/p percutaneous cholecystostomy tube 8/24: Finished Zosyn  total 10 day therapy. Follow chole tube output, daily flushes as ordered. IR follow up for tube management appreciated.  Chronic hypoxic respiratory failure S/p trach, Vent dependant PCCM follow up for vent management appreciated. Continue ventilator support, he seems at baseline settings.  Continue tracheostomy care. Respiratory cultures possible colonization, no signs of infection. Follow pulmonary recommendations.  Advanced ALS Dysphagia s/p PEG Transitioned to GJ tube by IR 8/28. Off TPN now. Able tolerate tube feeds.  Communicates with eye tracking system at bedside. Still has abdominal distention, KUB done 03/08/24 improved distention from prior one. Feeds, meds thru J tube, suction via G tube. Continue scheduled constipation regimen, enema as needed. Will keep him over weekend and plan to discharge on Monday, TOC arranging tube feed supplies.  At risk for malnutrition Continue tube feeds, supplements Dietician on board. Continue constipation  regimen, bisacodyl  PR.     Out of bed to chair. Incentive spirometry. Nursing supportive care. Fall, aspiration precautions. Diet:  Diet Orders (From admission, onward)     Start     Ordered   03/01/24 0001  Diet NPO time specified  Diet effective midnight        02/29/24 0957           DVT prophylaxis: heparin  injection 5,000 Units Start: 02/26/24 1715 SCDs Start: 02/26/24 1537  Level of care: ICU   Code Status: Full Code  Subjective: Patient is seen and examined today morning. He passed gas, no bm today per RN. Mother at bedside. He denies abdominal pain. Tolerating feeds well.   Physical Exam: Vitals:   03/09/24 1100 03/09/24 1104 03/09/24 1111 03/09/24 1200  BP: 125/79 125/79  136/77  Pulse: (!) 107   92  Resp: 17   16  Temp:   98.3 F (36.8 C)   TempSrc:   Oral   SpO2: 100%   100%  Weight:      Height:        General -  Middle aged African American male, no apparent distress HEENT - PERRLA, EOMI, atraumatic head, non tender sinuses. Lung - Clear, diffuse rales, s/p trach to vent. Heart - S1, S2 heard, no murmurs, rubs, trace pedal edema. Abdomen - Soft, non tender, distended, RUQ drain, GJ tube noted. Neuro - Alert, awake and oriented, paraplegia due to ALS. Skin - Warm and dry.  Data Reviewed:      Latest Ref Rng & Units 03/09/2024    4:04 AM 03/08/2024    3:56 AM 03/07/2024    5:00 AM  CBC  WBC 4.0 - 10.5 K/uL 13.2  13.2  13.7  Hemoglobin 13.0 - 17.0 g/dL 7.9  8.2  8.0   Hematocrit 39.0 - 52.0 % 25.4  27.1  26.0   Platelets 150 - 400 K/uL 449  364  455       Latest Ref Rng & Units 03/09/2024    4:04 AM 03/08/2024    3:56 AM 03/07/2024    5:00 AM  BMP  Glucose 70 - 99 mg/dL 861  893  870   BUN 6 - 20 mg/dL 13  9  12    Creatinine 0.61 - 1.24 mg/dL <9.69  <9.69  <9.69   Sodium 135 - 145 mmol/L 143  143  142   Potassium 3.5 - 5.1 mmol/L 3.9  3.8  3.6   Chloride 98 - 111 mmol/L 115  112  113   CO2 22 - 32 mmol/L 20  19  20    Calcium  8.9 - 10.3 mg/dL  9.2  9.2  9.2    DG Abd 1 View Result Date: 03/08/2024 CLINICAL DATA:  Abdominal distension. EXAM: ABDOMEN - 1 VIEW COMPARISON:  Radiograph dated 03/03/2024. FINDINGS: Support catheters in similar position. Significant improvement in the small bowel dilatation. There is diffuse air distension of the colon. No free air. No acute osseous pathology. IMPRESSION: Significant improvement in the small bowel dilatation. Electronically Signed   By: Vanetta Chou M.D.   On: 03/08/2024 21:34    Family Communication: Discussed with patient and mother at bedside and wife over phone. They understand and agree. All questions answered.  Disposition: Status is: Inpatient Remains inpatient appropriate because: severity of illness  Planned Discharge Destination: Home with Home Health     Time spent: 48 minutes  Author: Concepcion Riser, MD 03/09/2024 3:12 PM Secure chat 7am to 7pm For on call review [email or web address]

## 2024-03-09 NOTE — TOC Progression Note (Addendum)
 Transition of Care [facility]) - Progression Note    Patient Details  Name: Jerry [name] MRN: [id number] Date of Birth: 12-Jun-1971  Transition of Care [facility]) CM/SW Contact  Jerry KANDICE Stain, RN Phone Number: 03/09/2024, 12:53 PM  Clinical Narrative:    Left message with Wife, Jerry [name], requesting return call regarding transition needs. Patient will need tube feeds at discharge. This RNCM notified Jerry [name] with [employer].   1400 Spoke to wife, Jerry [name], regarding transition needs. Wife states patient has all needed DME except for tube feeds.Wife gave this RNCM Jerry [name] the CSW @ [facility]. This RNCM left secure VM @ [phone] requesting return call.  Lynette with [facility] confirmed patient is active for home health RN.   Wife will transport in ambulance with patient to monitor ventilator.  Barriers to Discharge: No Barriers Identified               Expected Discharge Plan and Services         Expected Discharge Date: 03/01/24                         HH Arranged: RN [facility] Agency: [facility] Date [facility] Agency Contacted: 03/02/24 Time HH Agency Contacted: [id number] Representative spoke with at [facility] Agency: Arna   Social Drivers of Health (SDOH) Interventions SDOH Screenings   Food Insecurity: No Food Insecurity (02/26/2024)  Housing: Low Risk  (02/26/2024)  Transportation Needs: No Transportation Needs (02/26/2024)  Utilities: Not At Risk (02/26/2024)  Alcohol  Screen: Low Risk  (09/04/2020)  Depression (PHQ2-9): Low Risk  (01/24/2024)  Financial Resource Strain: Low Risk  (09/29/2022)  Physical Activity: Inactive (10/11/2023)  Social Connections: Unknown (10/11/2023)  Stress: Stress Concern Present (10/11/2023)  Tobacco Use: Low Risk  (02/26/2024)  Health Literacy: Adequate Health Literacy (10/11/2023)    Readmission Risk Interventions     No data to display

## 2024-03-09 NOTE — Plan of Care (Signed)
  Problem: Respiratory: Goal: Ability to maintain adequate ventilation will improve Outcome: Progressing   Problem: Coping: Goal: Ability to adjust to condition or change in health will improve Outcome: Progressing   Problem: Fluid Volume: Goal: Ability to maintain a balanced intake and output will improve Outcome: Progressing   Problem: Metabolic: Goal: Ability to maintain appropriate glucose levels will improve Outcome: Progressing   Problem: Nutritional: Goal: Maintenance of adequate nutrition will improve Outcome: Progressing Goal: Progress toward achieving an optimal weight will improve Outcome: Progressing   Problem: Skin Integrity: Goal: Risk for impaired skin integrity will decrease Outcome: Progressing

## 2024-03-09 NOTE — Progress Notes (Signed)
 At this time patient requesting abdominal x-ray due to feeling bloated.   Upon assessment patient is distended and bowel sounds are hypoactive.   This RN will reach out to attending for KUB request.

## 2024-03-10 DIAGNOSIS — R1010 Upper abdominal pain, unspecified: Secondary | ICD-10-CM

## 2024-03-10 DIAGNOSIS — K810 Acute cholecystitis: Secondary | ICD-10-CM | POA: Diagnosis not present

## 2024-03-10 DIAGNOSIS — Z789 Other specified health status: Secondary | ICD-10-CM | POA: Diagnosis not present

## 2024-03-10 DIAGNOSIS — G1221 Amyotrophic lateral sclerosis: Secondary | ICD-10-CM | POA: Diagnosis not present

## 2024-03-10 LAB — GLUCOSE, CAPILLARY
Glucose-Capillary: 107 mg/dL — ABNORMAL HIGH (ref 70–99)
Glucose-Capillary: 116 mg/dL — ABNORMAL HIGH (ref 70–99)
Glucose-Capillary: 119 mg/dL — ABNORMAL HIGH (ref 70–99)
Glucose-Capillary: 122 mg/dL — ABNORMAL HIGH (ref 70–99)
Glucose-Capillary: 130 mg/dL — ABNORMAL HIGH (ref 70–99)
Glucose-Capillary: 136 mg/dL — ABNORMAL HIGH (ref 70–99)

## 2024-03-10 MED ORDER — DIPHENHYDRAMINE HCL 12.5 MG/5ML PO ELIX
12.5000 mg | ORAL_SOLUTION | Freq: Four times a day (QID) | ORAL | Status: DC | PRN
  Administered 2024-03-11: 12.5 mg
  Filled 2024-03-10 (×2): qty 5

## 2024-03-10 MED ORDER — FREE WATER
125.0000 mL | Status: DC
  Administered 2024-03-10 – 2024-03-11 (×7): 125 mL

## 2024-03-10 NOTE — Plan of Care (Signed)
  Problem: Clinical Measurements: Goal: Diagnostic test results will improve Outcome: Progressing Goal: Signs and symptoms of infection will decrease Outcome: Progressing   Problem: Respiratory: Goal: Ability to maintain adequate ventilation will improve Outcome: Progressing   Problem: Coping: Goal: Ability to adjust to condition or change in health will improve Outcome: Progressing   Problem: Fluid Volume: Goal: Ability to maintain a balanced intake and output will improve Outcome: Progressing   Problem: Metabolic: Goal: Ability to maintain appropriate glucose levels will improve Outcome: Progressing   Problem: Nutritional: Goal: Maintenance of adequate nutrition will improve Outcome: Progressing

## 2024-03-10 NOTE — Progress Notes (Signed)
 Nutrition Follow-up  DOCUMENTATION CODES:   Not applicable  INTERVENTION:   Continue Tube feeding via G-J tube (J-port): Recommend continue:   Osmolite 1.2 at 50 ml/h (1200 ml per day)  Prosource TF20 60 ml daily  Provides 1520 kcal, 87 gm protein, 984 ml free water  daily  125 ml free water  every 4 hours Total free water : 1734 ml   For home use, Nestle equivalent is Isosource HN Would recommend increasing rate to 43mL/h (134mL/d) to account for loss of additional nutrition from prosource.  NUTRITION DIAGNOSIS:   Inadequate oral intake related to inability to eat, altered GI function as evidenced by NPO status. Ongoing.   GOAL:   Patient will meet greater than or equal to 90% of their needs Met with TF at goal rate   MONITOR:   Labs, Weight trends, I & O's  REASON FOR ASSESSMENT:   Consult Enteral/tube feeding initiation and management  ASSESSMENT:   53 year old male who presented to the ED on 02/26/24 with abdominal pain, distention, and emesis. PMH of advanced ALS, chronic hypoxic respiratory failure s/p trach, vent dependence, G-tube in place, TPN dependence. Pt admitted with sepsis due to acute cholecystitis.  Per x-ray ileus improving. Pt remains on reglan  10 mg TID and bowel regimen having type 7 stools.  Plan to d/c home Monday. Reached out to [facility].   8/24 - s/p cholecystostomy catheter placement by IR  8/26 - trickle tube feeds started 8/28 - NPO for GJ tube exchanged. On TPN 9/1- TF reached goal rate  Medications reviewed and include: colace, SSI every 4 hours, 5 units novolog  every 4 hours, 10 mg reglan  TID, miralax , phos-nak 1 packet daily, 20 mEq KCl daily  Labs reviewed:  CBG's: 107-137   UOP: 750 ml Drain RLQ: 10 ml G-port: 200 ml   24 F G-J tube (placed 8/28)  Current weight: 74.9 kg Admission weight: 76.9 kg   Diet Order:   Diet Order             Diet NPO time specified  Diet effective midnight                   EDUCATION  NEEDS:   Not appropriate for education at this time  Skin:  Skin Assessment: Reviewed RN Assessment  Last BM:  9/5 large; type 7  Height:   Ht Readings from Last 1 Encounters:  03/02/24 5' 5 (1.651 m)    Weight:   Wt Readings from Last 1 Encounters:  03/09/24 74.9 kg    Ideal Body Weight:  55.6 kg (adjusted by 10% for quadriplegia)  BMI:  Body mass index is 27.48 kg/m.  Estimated Nutritional Needs:   Kcal:  1400-1600 kcal/d  Protein:  80-95 g/d  Fluid:  1.5-1.8L/d  Powell SQUIBB., RD, LDN, CNSC See AMiON for contact information

## 2024-03-10 NOTE — Progress Notes (Signed)
 PROGRESS NOTE  Jerry [name] FMW:[id number] DOB: [birth year]   PCP: [facility]  Patient is from: Home.  Lives with his wife.  DOA: 02/26/2024 LOS: 13  Chief complaints Chief Complaint  Patient presents with   Abdominal Pain     Brief Narrative / Interim history: 53 year old male with advanced ALS s/p trach with vent dependence, PEG, but on TPN as unable to tolerate tube feeds, brought to ED by EMS with complaint of abdominal pain, nausea and vomiting for 1 day.  Per wife he had 3 episodes of nonbilious vomiting. On workup he was noted to have possible acute cholecystitis, seen by general surgery and cholecystostomy tube was placed by IR 02/26/24, continued on Zosyn  therapy.  He also underwent G tube to GJ tube conversion by IR 8/28.  Restarted on tube feed that he tolerated.  Eventually came off TPN.  He was transferred to TRH service on 03/06/2024.  PCCM on board for ventilator management.   Plan for discharge home on 9/8  Subjective: Seen and examined earlier this morning.  No major events overnight or this morning.  Reported itching and dry lips to RN.  He says he was on free water  125 cc every 2 hours at home.  Denies pain or other complaints.  He has electronic device for communication.  Also notes yes and no  Objective: Vitals:   03/10/24 1000 03/10/24 1100 03/10/24 1118 03/10/24 1200  BP: (!) 140/82 120/77  114/67  Pulse: 94 (!) 111  83  Resp: 17 18  16   Temp:   98.5 F (36.9 C)   TempSrc:   Oral   SpO2: 100% 100%  100%  Weight:      Height:        Examination:  GENERAL: No apparent distress.  Nontoxic. HEENT: MMM.  Vision and hearing grossly intact.  NECK: Tracheostomy in place.  On the vent. RESP:  No IWOB.  Fair aeration bilaterally. CVS:  RRR. Heart sounds normal.  ABD/GI/GU: BS+. Abd soft, NTND.  Cholecystostomy drain.  GJ tube in place. MSK/EXT: Quadriplegia NEURO: AA.  Not verbal.  Uses electronic device for communication.  Nods yes or no.   Vasoplegia PSYCH: Calm. Normal affect.   Consultants:  PCCM General Surgery Interventional radiology  Procedures: 8/24-cholecystostomy drain 8/28-G-tube GJ conversion  Microbiology summarized: 8/24-MRSA, COVID-19, influenza and RSV PCR nonreactive 8/24-respiratory culture with Staph aureus and Pseudomonas aeruginosa 8/24-trach aspirate with Achromobacter xylosoxidans 8/24-blood cultures NGTD  Assessment and plan: Sepsis due to acute cholecystitis: Present on admission. -S/p percutaneous cholecystostomy tube on 8/24 -Completed 10 days of IV Zosyn  -Follow chole tube output, daily flushes as ordered. -IR follow up for tube management appreciated.   Chronic hypoxic respiratory failure: S/p trach, Vent dependant -PCCM follow up for vent management appreciated. -Continue ventilator support, he seems at baseline settings.  -Continue tracheostomy care. -Respiratory cultures possible colonization, no signs of infection.   Advanced ALS with vent and feeding tube dependence -G-tube converted to GJ tube by IR on 8/28 -Tolerating tube feed.  Off TPN now. -Complicated by using eye tracking system at bedside. -Continue bowel regimen -Added free water  at 125 cc every 4 hours.  Reports using 125 cc every 2 hours at home -Check electrolyte in the morning  Leukocytosis: Improved.  Completed antibiotic course for infection.  Hyponatremia: Mild and resolved.  Hyperkalemia: Resolved.  Hypokalemia/hypophosphatemia -Monitor replenish K and Mg as appropriate  Anemia of chronic disease: Stable - Monitor intermittently  High anion gap metabolic acidosis: Resolved.  Hyperglycemia:  A1c 5.2%.  Glucose within acceptable range.  Pruritus - Benadryl  per tube  Inadequate oral intake Body mass index is 27.48 kg/m. Nutrition Problem: Inadequate oral intake Etiology: inability to eat, altered GI function Signs/Symptoms: NPO status Interventions: Refer to RD note for recommendations    DVT prophylaxis:  heparin  injection 5,000 Units Start: 02/26/24 1715 SCDs Start: 02/26/24 1537  Code Status: Full code Family Communication: None at bedside Level of care: ICU Status is: Inpatient Remains inpatient appropriate because: Tube feed supplies   Final disposition: Home on Monday, 9/8  CRITICAL CARE Performed by: Mignon ONEIDA Bump   Total critical care time: 35 minutes  Critical care time was exclusive of separately billable procedures and treating other patients.  Critical care was necessary to treat or prevent imminent or life-threatening deterioration.  Critical care was time spent personally by me on the following activities: development of treatment plan with patient and/or surrogate as well as nursing, discussions with consultants, evaluation of patient's response to treatment, examination of patient, obtaining history from patient or surrogate, ordering and performing treatments and interventions, ordering and review of laboratory studies, ordering and review of radiographic studies, pulse oximetry and re-evaluation of patient's condition.   Sch Meds:  Scheduled Meds:  Chlorhexidine  Gluconate Cloth  6 each Topical Nightly   docusate  100 mg Per Tube BID   feeding supplement (PROSource TF20)  60 mL Per Tube Daily   free water   125 mL Per Tube Q4H   glycopyrrolate   0.2 mg Intravenous BID   heparin   5,000 Units Subcutaneous Q8H   insulin  aspart  0-20 Units Subcutaneous Q4H   insulin  aspart  5 Units Subcutaneous Q4H   ipratropium-albuterol   3 mL Nebulization BID   metoCLOPramide   10 mg Per Tube TID   mouth rinse  15 mL Mouth Rinse Q2H   polyethylene glycol  17 g Per Tube Daily   potassium & sodium phosphates   1 packet Per Tube Daily   potassium chloride   20 mEq Per Tube Daily   pyridostigmine   30 mg Per Tube BID   scopolamine   1 patch Transdermal Q72H   sodium chloride  flush  10-40 mL Intracatheter Q12H   Continuous Infusions:  feeding supplement (OSMOLITE 1.2  CAL) 50 mL/hr at 03/10/24 1200   PRN Meds:.acetaminophen  **OR** acetaminophen , albuterol , diphenhydrAMINE , docusate, fentaNYL  (SUBLIMAZE ) injection, lip balm, ondansetron  (ZOFRAN ) IV, mouth rinse, polyethylene glycol, simethicone , sodium chloride  flush  Antimicrobials: Anti-infectives (From admission, onward)    Start     Dose/Rate Route Frequency Ordered Stop   02/27/24 0100  ceFEPIme  (MAXIPIME ) 2 g in sodium chloride  0.9 % 100 mL IVPB  Status:  Discontinued        2 g 200 mL/hr over 30 Minutes Intravenous Every 12 hours 02/26/24 1636 02/26/24 1707   02/27/24 0100  vancomycin  (VANCOREADY) IVPB 750 mg/150 mL  Status:  Discontinued        750 mg 150 mL/hr over 60 Minutes Intravenous Every 12 hours 02/26/24 1636 02/29/24 0850   02/27/24 0100  piperacillin -tazobactam (ZOSYN ) IVPB 3.375 g  Status:  Discontinued        3.375 g 12.5 mL/hr over 240 Minutes Intravenous Every 8 hours 02/26/24 1709 03/07/24 0744   02/26/24 2200  metroNIDAZOLE  (FLAGYL ) IVPB 500 mg  Status:  Discontinued        500 mg 100 mL/hr over 60 Minutes Intravenous 2 times daily 02/26/24 1542 02/26/24 1707   02/26/24 1300  metroNIDAZOLE  (FLAGYL ) IVPB 500 mg  500 mg 100 mL/hr over 60 Minutes Intravenous  Once 02/26/24 1247 02/26/24 1406   02/26/24 1215  ceFEPIme  (MAXIPIME ) 2 g in sodium chloride  0.9 % 100 mL IVPB        2 g 200 mL/hr over 30 Minutes Intravenous  Once 02/26/24 1200 02/26/24 1317   02/26/24 1215  vancomycin  (VANCOREADY) IVPB 1250 mg/250 mL        1,250 mg 166.7 mL/hr over 90 Minutes Intravenous  Once 02/26/24 1200 02/26/24 1519        I have personally reviewed the following labs and images: CBC: Recent Labs  Lab 03/07/24 0500 03/08/24 0356 03/09/24 0404  WBC 13.7* 13.2* 13.2*  HGB 8.0* 8.2* 7.9*  HCT 26.0* 27.1* 25.4*  MCV 87.5 89.7 87.9  PLT 455* 364 449*   BMP &GFR Recent Labs  Lab 03/04/24 0458 03/05/24 0301 03/07/24 0500 03/08/24 0356 03/09/24 0404  NA 138 134* 142 143 143   K 3.7 3.9 3.6 3.8 3.9  CL 107 104 113* 112* 115*  CO2 22 21* 20* 19* 20*  GLUCOSE 183* 131* 129* 106* 138*  BUN 10 10 12 9 13   CREATININE <0.30* <0.30* <0.30* <0.30* <0.30*  CALCIUM  8.9 8.6* 9.2 9.2 9.2  MG  --  2.0  --   --   --   PHOS 2.7 3.0  --   --   --    CrCl cannot be calculated (This lab value cannot be used to calculate CrCl because it is not a number: <0.30). Liver & Pancreas: Recent Labs  Lab 03/05/24 0301 03/07/24 0500 03/08/24 0356 03/09/24 0404  AST 31 37 31 29  ALT 36 48* 44 40  ALKPHOS 354* 414* 416* 362*  BILITOT 1.2 1.1 1.3* 1.2  PROT 6.5 7.4 7.4 7.5  ALBUMIN 1.8* 2.5* 2.6* 2.7*   No results for input(s): LIPASE, AMYLASE in the last 168 hours. No results for input(s): AMMONIA in the last 168 hours. Diabetic: No results for input(s): HGBA1C in the last 72 hours. Recent Labs  Lab 03/09/24 1917 03/09/24 2310 03/10/24 0307 03/10/24 0730 03/10/24 1117  GLUCAP 128* 124* 122* 107* 130*   Cardiac Enzymes: No results for input(s): CKTOTAL, CKMB, CKMBINDEX, TROPONINI in the last 168 hours. No results for input(s): PROBNP in the last 8760 hours. Coagulation Profile: No results for input(s): INR, PROTIME in the last 168 hours. Thyroid Function Tests: No results for input(s): TSH, T4TOTAL, FREET4, T3FREE, THYROIDAB in the last 72 hours. Lipid Profile: No results for input(s): CHOL, HDL, LDLCALC, TRIG, CHOLHDL, LDLDIRECT in the last 72 hours. Anemia Panel: No results for input(s): VITAMINB12, FOLATE, FERRITIN, TIBC, IRON, RETICCTPCT in the last 72 hours. Urine analysis:    Component Value Date/Time   COLORURINE AMBER (A) 02/26/2024 0343   APPEARANCEUR HAZY (A) 02/26/2024 0343   LABSPEC >1.046 (H) 02/26/2024 0343   PHURINE 7.0 02/26/2024 0343   GLUCOSEU 50 (A) 02/26/2024 0343   HGBUR NEGATIVE 02/26/2024 0343   BILIRUBINUR NEGATIVE 02/26/2024 0343   KETONESUR 5 (A) 02/26/2024 0343   PROTEINUR 30  (A) 02/26/2024 0343   NITRITE NEGATIVE 02/26/2024 0343   LEUKOCYTESUR MODERATE (A) 02/26/2024 0343   Sepsis Labs: Invalid input(s): PROCALCITONIN, LACTICIDVEN  Microbiology: No results found for this or any previous visit (from the past 240 hours).  Radiology Studies: No results found.    Denice Cardon T. Keanu Lesniak [facility]  If 7PM-7AM, please contact night-coverage [email or web address] 03/10/2024, 1:38 PM

## 2024-03-11 DIAGNOSIS — R1011 Right upper quadrant pain: Secondary | ICD-10-CM

## 2024-03-11 DIAGNOSIS — K810 Acute cholecystitis: Secondary | ICD-10-CM | POA: Diagnosis not present

## 2024-03-11 DIAGNOSIS — Z931 Gastrostomy status: Secondary | ICD-10-CM | POA: Diagnosis not present

## 2024-03-11 DIAGNOSIS — G1221 Amyotrophic lateral sclerosis: Secondary | ICD-10-CM | POA: Diagnosis not present

## 2024-03-11 LAB — GLUCOSE, CAPILLARY
Glucose-Capillary: 101 mg/dL — ABNORMAL HIGH (ref 70–99)
Glucose-Capillary: 111 mg/dL — ABNORMAL HIGH (ref 70–99)
Glucose-Capillary: 117 mg/dL — ABNORMAL HIGH (ref 70–99)
Glucose-Capillary: 118 mg/dL — ABNORMAL HIGH (ref 70–99)
Glucose-Capillary: 128 mg/dL — ABNORMAL HIGH (ref 70–99)
Glucose-Capillary: 143 mg/dL — ABNORMAL HIGH (ref 70–99)

## 2024-03-11 LAB — CULTURE, RESPIRATORY W GRAM STAIN

## 2024-03-11 LAB — COMPREHENSIVE METABOLIC PANEL WITH GFR
ALT: 33 U/L (ref 0–44)
AST: 23 U/L (ref 15–41)
Albumin: 2.9 g/dL — ABNORMAL LOW (ref 3.5–5.0)
Alkaline Phosphatase: 341 U/L — ABNORMAL HIGH (ref 38–126)
Anion gap: 10 (ref 5–15)
BUN: 17 mg/dL (ref 6–20)
CO2: 22 mmol/L (ref 22–32)
Calcium: 9.6 mg/dL (ref 8.9–10.3)
Chloride: 109 mmol/L (ref 98–111)
Creatinine, Ser: <0.3 mg/dL — ABNORMAL LOW (ref 0.61–1.24)
Glucose, Bld: 97 mg/dL (ref 70–99)
Potassium: 3.9 mmol/L (ref 3.5–5.1)
Sodium: 141 mmol/L (ref 135–145)
Total Bilirubin: 1.3 mg/dL — ABNORMAL HIGH (ref 0.0–1.2)
Total Protein: 8.1 g/dL (ref 6.5–8.1)

## 2024-03-11 LAB — CBC
HCT: 28.7 % — ABNORMAL LOW (ref 39.0–52.0)
Hemoglobin: 8.9 g/dL — ABNORMAL LOW (ref 13.0–17.0)
MCH: 27.1 pg (ref 26.0–34.0)
MCHC: 31 g/dL (ref 30.0–36.0)
MCV: 87.5 fL (ref 80.0–100.0)
Platelets: 495 K/uL — ABNORMAL HIGH (ref 150–400)
RBC: 3.28 MIL/uL — ABNORMAL LOW (ref 4.22–5.81)
RDW: 17.2 % — ABNORMAL HIGH (ref 11.5–15.5)
WBC: 11.6 K/uL — ABNORMAL HIGH (ref 4.0–10.5)
nRBC: 0 % (ref 0.0–0.2)

## 2024-03-11 LAB — MAGNESIUM: Magnesium: 1.9 mg/dL (ref 1.7–2.4)

## 2024-03-11 MED ORDER — PYRIDOSTIGMINE BROMIDE 60 MG/5ML PO SOLN
30.0000 mg | Freq: Two times a day (BID) | ORAL | Status: DC
  Administered 2024-03-11 – 2024-03-12 (×2): 30 mg
  Filled 2024-03-11 (×3): qty 2.5

## 2024-03-11 MED ORDER — FREE WATER
125.0000 mL | Freq: Four times a day (QID) | Status: DC
  Administered 2024-03-11 – 2024-03-12 (×5): 125 mL

## 2024-03-11 NOTE — Progress Notes (Addendum)
 PROGRESS NOTE  Jerry [name] FMW:[id number] DOB: 07/02/1971   PCP: [facility]  Patient is from: Home.  Lives with his wife.  DOA: 02/26/2024 LOS: 14  Chief complaints Chief Complaint  Patient presents with   Abdominal Pain     Brief Narrative / Interim history: 53 year old male with advanced ALS s/p trach with vent dependence, PEG, but on TPN as unable to tolerate tube feeds, brought to ED by EMS with complaint of abdominal pain, nausea and vomiting for 1 day.  Per wife he had 3 episodes of nonbilious vomiting. On workup he was noted to have possible acute cholecystitis, seen by general surgery and cholecystostomy tube was placed by IR 02/26/24, continued on Zosyn  therapy.  He also underwent G tube to GJ tube conversion by IR 8/28.  Restarted on tube feed that he tolerated.  Eventually came off TPN.  He was transferred to TRH service on 03/06/2024.  PCCM on board for ventilator management.   Plan for discharge home on 9/8  Subjective: Seen and examined earlier this morning.  No major events overnight or this morning.  Reports increased secretion.  No other complaints.  Objective: Vitals:   03/11/24 1100 03/11/24 1112 03/11/24 1127 03/11/24 1200  BP: 104/76  104/76 116/68  Pulse: (!) 115   (!) 103  Resp: 16   17  Temp:  98.1 F (36.7 C)    TempSrc:  Axillary    SpO2: 100%   100%  Weight:      Height:        Examination:  GENERAL: No apparent distress.  Nontoxic. HEENT: MMM.  Vision and hearing grossly intact.  NECK: Tracheostomy in place.  On the vent. RESP:  No IWOB.  Fair aeration bilaterally. CVS:  RRR. Heart sounds normal.  ABD/GI/GU: BS+. Abd soft, NTND.  Cholecystostomy drain.  GJ tube in place. MSK/EXT: Quadriplegia NEURO: AA.  Not verbal.  Uses electronic device for communication.  Nods yes or no.  Vasoplegia PSYCH: Calm. Normal affect.   Consultants:  PCCM General Surgery Interventional radiology  Procedures: 8/24-cholecystostomy  drain 8/28-G-tube GJ conversion  Microbiology summarized: 8/24-MRSA, COVID-19, influenza and RSV PCR nonreactive 8/24-respiratory culture with Staph aureus and Pseudomonas aeruginosa 8/24-trach aspirate with Achromobacter xylosoxidans 8/24-blood cultures NGTD  Assessment and plan: Sepsis due to acute cholecystitis: Present on admission. -S/p percutaneous cholecystostomy tube on 8/24 -Completed 10 days of IV Zosyn  -Follow chole tube output, daily flushes as ordered. -IR follow up for tube management appreciated.   Chronic hypoxic respiratory failure: S/p trach, Vent dependant -PCCM follow up for vent management appreciated. -Continue ventilator support, he seems at baseline settings.  -Continue tracheostomy care. -Respiratory cultures possible colonization, no signs of infection.   Advanced ALS with vent and feeding tube dependence -G-tube converted to GJ tube by IR on 8/28 -Tolerating tube feed.  Off TPN now.  It seems like there is FW in TF.  Decrease bolus FW to every 6 hours -Communication by using eye tracking system at bedside. -Continue Robinul  and scopolamine  patch for secretion  -Continue Reglan  and mestinon  for motility.  -Monitor sodium and other electrolytes.  Normocytic anemia: Hgb stable after initial drop.  No overt bleeding. - Monitor  Leukocytosis:  Completed antibiotic course for infection.  Resolving  Hyponatremia: Mild and resolved.  Hyperkalemia: Resolved.  Hypokalemia/hypophosphatemia -Monitor replenish K and Mg as appropriate  High anion gap metabolic acidosis: Resolved.  Hyperglycemia:  A1c 5.2%.  Glucose within acceptable range.  Pruritus - Benadryl  per tube  Inadequate oral intake  Body mass index is 27.48 kg/m. Nutrition Problem: Inadequate oral intake Etiology: inability to eat, altered GI function Signs/Symptoms: NPO status Interventions: Refer to RD note for recommendations   DVT prophylaxis:  heparin  injection 5,000 Units Start:  02/26/24 1715 SCDs Start: 02/26/24 1537  Code Status: Full code Family Communication: Updated patient's wife over the phone Level of care: ICU Status is: Inpatient Remains inpatient appropriate because: Tube feed supplies   Final disposition: Home on Monday, 9/8  CRITICAL CARE Performed by: Mignon ONEIDA Bump   Total critical care time: 35 minutes  Critical care time was exclusive of separately billable procedures and treating other patients.  Critical care was necessary to treat or prevent imminent or life-threatening deterioration.  Critical care was time spent personally by me on the following activities: development of treatment plan with patient and/or surrogate as well as nursing, discussions with consultants, evaluation of patient's response to treatment, examination of patient, obtaining history from patient or surrogate, ordering and performing treatments and interventions, ordering and review of laboratory studies, ordering and review of radiographic studies, pulse oximetry and re-evaluation of patient's condition.   Sch Meds:  Scheduled Meds:  Chlorhexidine  Gluconate Cloth  6 each Topical Nightly   docusate  100 mg Per Tube BID   feeding supplement (PROSource TF20)  60 mL Per Tube Daily   free water   125 mL Per Tube Q4H   glycopyrrolate   0.2 mg Intravenous BID   heparin   5,000 Units Subcutaneous Q8H   insulin  aspart  0-20 Units Subcutaneous Q4H   insulin  aspart  5 Units Subcutaneous Q4H   ipratropium-albuterol   3 mL Nebulization BID   metoCLOPramide   10 mg Per Tube TID   mouth rinse  15 mL Mouth Rinse Q2H   polyethylene glycol  17 g Per Tube Daily   potassium & sodium phosphates   1 packet Per Tube Daily   potassium chloride   20 mEq Per Tube Daily   scopolamine   1 patch Transdermal Q72H   sodium chloride  flush  10-40 mL Intracatheter Q12H   Continuous Infusions:  feeding supplement (OSMOLITE 1.2 CAL) 50 mL/hr at 03/11/24 1200   PRN Meds:.acetaminophen  **OR**  acetaminophen , albuterol , diphenhydrAMINE , docusate, fentaNYL  (SUBLIMAZE ) injection, lip balm, ondansetron  (ZOFRAN ) IV, mouth rinse, polyethylene glycol, simethicone , sodium chloride  flush  Antimicrobials: Anti-infectives (From admission, onward)    Start     Dose/Rate Route Frequency Ordered Stop   02/27/24 0100  ceFEPIme  (MAXIPIME ) 2 g in sodium chloride  0.9 % 100 mL IVPB  Status:  Discontinued        2 g 200 mL/hr over 30 Minutes Intravenous Every 12 hours 02/26/24 1636 02/26/24 1707   02/27/24 0100  vancomycin  (VANCOREADY) IVPB 750 mg/150 mL  Status:  Discontinued        750 mg 150 mL/hr over 60 Minutes Intravenous Every 12 hours 02/26/24 1636 02/29/24 0850   02/27/24 0100  piperacillin -tazobactam (ZOSYN ) IVPB 3.375 g  Status:  Discontinued        3.375 g 12.5 mL/hr over 240 Minutes Intravenous Every 8 hours 02/26/24 1709 03/07/24 0744   02/26/24 2200  metroNIDAZOLE  (FLAGYL ) IVPB 500 mg  Status:  Discontinued        500 mg 100 mL/hr over 60 Minutes Intravenous 2 times daily 02/26/24 1542 02/26/24 1707   02/26/24 1300  metroNIDAZOLE  (FLAGYL ) IVPB 500 mg        500 mg 100 mL/hr over 60 Minutes Intravenous  Once 02/26/24 1247 02/26/24 1406   02/26/24 1215  ceFEPIme  (MAXIPIME ) 2  g in sodium chloride  0.9 % 100 mL IVPB        2 g 200 mL/hr over 30 Minutes Intravenous  Once 02/26/24 1200 02/26/24 1317   02/26/24 1215  vancomycin  (VANCOREADY) IVPB 1250 mg/250 mL        1,250 mg 166.7 mL/hr over 90 Minutes Intravenous  Once 02/26/24 1200 02/26/24 1519        I have personally reviewed the following labs and images: CBC: Recent Labs  Lab 03/07/24 0500 03/08/24 0356 03/09/24 0404 03/11/24 0226  WBC 13.7* 13.2* 13.2* 11.6*  HGB 8.0* 8.2* 7.9* 8.9*  HCT 26.0* 27.1* 25.4* 28.7*  MCV 87.5 89.7 87.9 87.5  PLT 455* 364 449* 495*   BMP &GFR Recent Labs  Lab 03/05/24 0301 03/07/24 0500 03/08/24 0356 03/09/24 0404 03/11/24 0226  NA 134* 142 143 143 141  K 3.9 3.6 3.8 3.9 3.9   CL 104 113* 112* 115* 109  CO2 21* 20* 19* 20* 22  GLUCOSE 131* 129* 106* 138* 97  BUN 10 12 9 13 17   CREATININE <0.30* <0.30* <0.30* <0.30* <0.30*  CALCIUM  8.6* 9.2 9.2 9.2 9.6  MG 2.0  --   --   --  1.9  PHOS 3.0  --   --   --   --    CrCl cannot be calculated (This lab value cannot be used to calculate CrCl because it is not a number: <0.30). Liver & Pancreas: Recent Labs  Lab 03/05/24 0301 03/07/24 0500 03/08/24 0356 03/09/24 0404 03/11/24 0226  AST 31 37 31 29 23   ALT 36 48* 44 40 33  ALKPHOS 354* 414* 416* 362* 341*  BILITOT 1.2 1.1 1.3* 1.2 1.3*  PROT 6.5 7.4 7.4 7.5 8.1  ALBUMIN 1.8* 2.5* 2.6* 2.7* 2.9*   No results for input(s): LIPASE, AMYLASE in the last 168 hours. No results for input(s): AMMONIA in the last 168 hours. Diabetic: No results for input(s): HGBA1C in the last 72 hours. Recent Labs  Lab 03/10/24 1932 03/10/24 2350 03/11/24 0313 03/11/24 0730 03/11/24 1111  GLUCAP 136* 116* 101* 143* 111*   Cardiac Enzymes: No results for input(s): CKTOTAL, CKMB, CKMBINDEX, TROPONINI in the last 168 hours. No results for input(s): PROBNP in the last 8760 hours. Coagulation Profile: No results for input(s): INR, PROTIME in the last 168 hours. Thyroid Function Tests: No results for input(s): TSH, T4TOTAL, FREET4, T3FREE, THYROIDAB in the last 72 hours. Lipid Profile: No results for input(s): CHOL, HDL, LDLCALC, TRIG, CHOLHDL, LDLDIRECT in the last 72 hours. Anemia Panel: No results for input(s): VITAMINB12, FOLATE, FERRITIN, TIBC, IRON, RETICCTPCT in the last 72 hours. Urine analysis:    Component Value Date/Time   COLORURINE AMBER (A) 02/26/2024 0343   APPEARANCEUR HAZY (A) 02/26/2024 0343   LABSPEC >1.046 (H) 02/26/2024 0343   PHURINE 7.0 02/26/2024 0343   GLUCOSEU 50 (A) 02/26/2024 0343   HGBUR NEGATIVE 02/26/2024 0343   BILIRUBINUR NEGATIVE 02/26/2024 0343   KETONESUR 5 (A) 02/26/2024 0343    PROTEINUR 30 (A) 02/26/2024 0343   NITRITE NEGATIVE 02/26/2024 0343   LEUKOCYTESUR MODERATE (A) 02/26/2024 0343   Sepsis Labs: Invalid input(s): PROCALCITONIN, LACTICIDVEN  Microbiology: No results found for this or any previous visit (from the past 240 hours).  Radiology Studies: No results found.    Shayda Kalka T. Merik Mignano [facility]  If 7PM-7AM, please contact night-coverage [email or web address] 03/11/2024, 1:29 PM

## 2024-03-11 NOTE — Plan of Care (Signed)
Updated patient's wife over the phone. 

## 2024-03-11 NOTE — Progress Notes (Signed)
 Patient's wife expressed concern to RN regarding need for a daytime nurse at home once the patient is discharged. Patient's current daytime nurse is no longer able to work with patient. RN informed wife that case management should be reaching out to her tomorrow before the patient leaves to make sure everything is covered for patient.

## 2024-03-11 NOTE — Plan of Care (Signed)
  Problem: Respiratory: Goal: Ability to maintain adequate ventilation will improve Outcome: Progressing   Problem: Coping: Goal: Ability to adjust to condition or change in health will improve Outcome: Progressing   Problem: Metabolic: Goal: Ability to maintain appropriate glucose levels will improve Outcome: Progressing   Problem: Nutritional: Goal: Maintenance of adequate nutrition will improve Outcome: Progressing   Problem: Skin Integrity: Goal: Risk for impaired skin integrity will decrease Outcome: Progressing   Problem: Tissue Perfusion: Goal: Adequacy of tissue perfusion will improve Outcome: Progressing

## 2024-03-11 NOTE — Procedures (Signed)
 Tracheostomy Change Note  Patient Details:   Name: Shanta Dorvil DOB: [birth year] MRN: [id number]    Airway Documentation:     Evaluation  O2 sats: stable throughout Complications: No apparent complications Patient did tolerate procedure well. Bilateral Breath Sounds: Rhonchi   RT x 2 changed trach to #8 shiley flex as routine trach change. Positive color change was noted after insertion. No blood was found around stoma site. Pt tolerated change. RT will monitor.   Lydiann Bonifas 03/11/2024, 2:00 PM

## 2024-03-12 DIAGNOSIS — Z9911 Dependence on respirator [ventilator] status: Secondary | ICD-10-CM | POA: Diagnosis not present

## 2024-03-12 DIAGNOSIS — G1221 Amyotrophic lateral sclerosis: Secondary | ICD-10-CM | POA: Diagnosis not present

## 2024-03-12 DIAGNOSIS — R1011 Right upper quadrant pain: Secondary | ICD-10-CM | POA: Diagnosis not present

## 2024-03-12 DIAGNOSIS — K810 Acute cholecystitis: Secondary | ICD-10-CM | POA: Diagnosis not present

## 2024-03-12 DIAGNOSIS — J9611 Chronic respiratory failure with hypoxia: Secondary | ICD-10-CM | POA: Diagnosis not present

## 2024-03-12 DIAGNOSIS — J9601 Acute respiratory failure with hypoxia: Secondary | ICD-10-CM | POA: Diagnosis not present

## 2024-03-12 DIAGNOSIS — Z930 Tracheostomy status: Secondary | ICD-10-CM | POA: Diagnosis not present

## 2024-03-12 LAB — CBC
HCT: 27.1 % — ABNORMAL LOW (ref 39.0–52.0)
Hemoglobin: 8.3 g/dL — ABNORMAL LOW (ref 13.0–17.0)
MCH: 26.9 pg (ref 26.0–34.0)
MCHC: 30.6 g/dL (ref 30.0–36.0)
MCV: 87.7 fL (ref 80.0–100.0)
Platelets: 446 K/uL — ABNORMAL HIGH (ref 150–400)
RBC: 3.09 MIL/uL — ABNORMAL LOW (ref 4.22–5.81)
RDW: 17 % — ABNORMAL HIGH (ref 11.5–15.5)
WBC: 10.7 K/uL — ABNORMAL HIGH (ref 4.0–10.5)
nRBC: 0 % (ref 0.0–0.2)

## 2024-03-12 LAB — GLUCOSE, CAPILLARY
Glucose-Capillary: 95 mg/dL (ref 70–99)
Glucose-Capillary: 117 mg/dL — ABNORMAL HIGH (ref 70–99)
Glucose-Capillary: 130 mg/dL — ABNORMAL HIGH (ref 70–99)
Glucose-Capillary: 99 mg/dL (ref 70–99)

## 2024-03-12 LAB — RENAL FUNCTION PANEL
Albumin: 2.8 g/dL — ABNORMAL LOW (ref 3.5–5.0)
Anion gap: 13 (ref 5–15)
BUN: 17 mg/dL (ref 6–20)
CO2: 21 mmol/L — ABNORMAL LOW (ref 22–32)
Calcium: 9.5 mg/dL (ref 8.9–10.3)
Chloride: 106 mmol/L (ref 98–111)
Creatinine, Ser: <0.3 mg/dL — ABNORMAL LOW (ref 0.61–1.24)
Glucose, Bld: 77 mg/dL (ref 70–99)
Phosphorus: 3.1 mg/dL (ref 2.5–4.6)
Potassium: 3.8 mmol/L (ref 3.5–5.1)
Sodium: 140 mmol/L (ref 135–145)

## 2024-03-12 LAB — TRIGLYCERIDES: Triglycerides: 102 mg/dL (ref <150)

## 2024-03-12 LAB — MAGNESIUM: Magnesium: 2 mg/dL (ref 1.7–2.4)

## 2024-03-12 MED ORDER — SODIUM CHLORIDE 0.9% FLUSH: 10.0000 mL | Freq: Two times a day (BID) | INTRAVENOUS | 0 refills | Status: DC

## 2024-03-12 MED ORDER — PYRIDOSTIGMINE BROMIDE 60 MG/5ML PO SOLN: 30.0000 mg | Freq: Two times a day (BID) | ORAL | 0 refills | Status: DC

## 2024-03-12 MED ORDER — SIMETHICONE 40 MG/0.6ML PO LIQD: 80.0000 mg | Freq: Four times a day (QID) | ORAL | 0 refills | Status: AC

## 2024-03-12 MED ORDER — METOCLOPRAMIDE HCL 5 MG/5ML PO SOLN: 10.0000 mg | Freq: Three times a day (TID) | ORAL | 0 refills | Status: DC

## 2024-03-12 MED ORDER — HEPARIN SOD (PORK) LOCK FLUSH 100 UNIT/ML IV SOLN
500.0000 [IU] | INTRAVENOUS | Status: AC | PRN
  Administered 2024-03-12: 500 [IU]

## 2024-03-12 MED ORDER — FREE WATER: 125.0000 mL | Freq: Four times a day (QID) | 0 refills | Status: DC

## 2024-03-12 MED ORDER — OSMOLITE 1.2 CAL PO LIQD: 50.0000 mL/h | ORAL | 0 refills | Status: AC

## 2024-03-12 MED ORDER — SIMETHICONE 40 MG/0.6ML PO LIQD: 80.0000 mg | Freq: Four times a day (QID) | ORAL | 0 refills | Status: DC

## 2024-03-12 MED ORDER — PROSOURCE TF20 ENFIT COMPATIBL EN LIQD: 60.0000 mL | Freq: Every day | ENTERAL | 0 refills | Status: DC

## 2024-03-12 NOTE — Plan of Care (Signed)
 Problem: Fluid Volume: Goal: Hemodynamic stability will improve 03/12/2024 1631 by Val Violeta FALCON, RN Outcome: Adequate for Discharge 03/12/2024 1631 by Val Violeta FALCON, RN Outcome: Progressing   Problem: Clinical Measurements: Goal: Diagnostic test results will improve 03/12/2024 1631 by Val Violeta FALCON, RN Outcome: Adequate for Discharge 03/12/2024 1631 by Val Violeta FALCON, RN Outcome: Progressing Goal: Signs and symptoms of infection will decrease 03/12/2024 1631 by Val Violeta FALCON, RN Outcome: Adequate for Discharge 03/12/2024 1631 by Val Violeta FALCON, RN Outcome: Progressing   Problem: Respiratory: Goal: Ability to maintain adequate ventilation will improve 03/12/2024 1631 by Val Violeta FALCON, RN Outcome: Adequate for Discharge 03/12/2024 1631 by Val Violeta FALCON, RN Outcome: Progressing   Problem: Education: Goal: Ability to describe self-care measures that may prevent or decrease complications (Diabetes Survival Skills Education) will improve 03/12/2024 1631 by Val Violeta FALCON, RN Outcome: Adequate for Discharge 03/12/2024 1631 by Val Violeta FALCON, RN Outcome: Progressing Goal: Individualized Educational Video(s) 03/12/2024 1631 by Val Violeta FALCON, RN Outcome: Adequate for Discharge 03/12/2024 1631 by Val Violeta FALCON, RN Outcome: Progressing   Problem: Coping: Goal: Ability to adjust to condition or change in health will improve 03/12/2024 1631 by Val Violeta FALCON, RN Outcome: Adequate for Discharge 03/12/2024 1631 by Val Violeta FALCON, RN Outcome: Progressing   Problem: Fluid Volume: Goal: Ability to maintain a balanced intake and output will improve 03/12/2024 1631 by Val Violeta FALCON, RN Outcome: Adequate for Discharge 03/12/2024 1631 by Val Violeta FALCON, RN Outcome: Progressing   Problem: Health Behavior/Discharge Planning: Goal: Ability to identify and utilize available resources and services will improve 03/12/2024 1631 by Val Violeta FALCON,  RN Outcome: Adequate for Discharge 03/12/2024 1631 by Val Violeta FALCON, RN Outcome: Progressing Goal: Ability to manage health-related needs will improve 03/12/2024 1631 by Val Violeta FALCON, RN Outcome: Adequate for Discharge 03/12/2024 1631 by Val Violeta FALCON, RN Outcome: Progressing   Problem: Metabolic: Goal: Ability to maintain appropriate glucose levels will improve 03/12/2024 1631 by Val Violeta FALCON, RN Outcome: Adequate for Discharge 03/12/2024 1631 by Val Violeta FALCON, RN Outcome: Progressing   Problem: Nutritional: Goal: Maintenance of adequate nutrition will improve 03/12/2024 1631 by Val Violeta FALCON, RN Outcome: Adequate for Discharge 03/12/2024 1631 by Val Violeta FALCON, RN Outcome: Progressing Goal: Progress toward achieving an optimal weight will improve 03/12/2024 1631 by Val Violeta FALCON, RN Outcome: Adequate for Discharge 03/12/2024 1631 by Val Violeta FALCON, RN Outcome: Progressing   Problem: Skin Integrity: Goal: Risk for impaired skin integrity will decrease 03/12/2024 1631 by Val Violeta FALCON, RN Outcome: Adequate for Discharge 03/12/2024 1631 by Val Violeta FALCON, RN Outcome: Progressing   Problem: Tissue Perfusion: Goal: Adequacy of tissue perfusion will improve 03/12/2024 1631 by Val Violeta FALCON, RN Outcome: Adequate for Discharge 03/12/2024 1631 by Val Violeta FALCON, RN Outcome: Progressing   Problem: Education: Goal: Knowledge of General Education information will improve Description: Including pain rating scale, medication(s)/side effects and non-pharmacologic comfort measures 03/12/2024 1631 by Val Violeta FALCON, RN Outcome: Adequate for Discharge 03/12/2024 1631 by Val Violeta FALCON, RN Outcome: Progressing   Problem: Health Behavior/Discharge Planning: Goal: Ability to manage health-related needs will improve 03/12/2024 1631 by Val Violeta FALCON, RN Outcome: Adequate for Discharge 03/12/2024 1631 by Val Violeta FALCON, RN Outcome:  Progressing   Problem: Clinical Measurements: Goal: Ability to maintain clinical measurements within normal limits will improve 03/12/2024 1631 by Val Violeta FALCON, RN Outcome: Adequate for Discharge 03/12/2024 1631 by Val Violeta FALCON, RN Outcome: Progressing Goal: Will remain free  from infection 03/12/2024 1631 by Val Violeta FALCON, RN Outcome: Adequate for Discharge 03/12/2024 1631 by Val Violeta FALCON, RN Outcome: Progressing Goal: Diagnostic test results will improve 03/12/2024 1631 by Val Violeta FALCON, RN Outcome: Adequate for Discharge 03/12/2024 1631 by Val Violeta FALCON, RN Outcome: Progressing Goal: Respiratory complications will improve 03/12/2024 1631 by Val Violeta FALCON, RN Outcome: Adequate for Discharge 03/12/2024 1631 by Val Violeta FALCON, RN Outcome: Progressing Goal: Cardiovascular complication will be avoided 03/12/2024 1631 by Val Violeta FALCON, RN Outcome: Adequate for Discharge 03/12/2024 1631 by Val Violeta FALCON, RN Outcome: Progressing   Problem: Activity: Goal: Risk for activity intolerance will decrease 03/12/2024 1631 by Val Violeta FALCON, RN Outcome: Adequate for Discharge 03/12/2024 1631 by Val Violeta FALCON, RN Outcome: Progressing   Problem: Nutrition: Goal: Adequate nutrition will be maintained 03/12/2024 1631 by Val Violeta FALCON, RN Outcome: Adequate for Discharge 03/12/2024 1631 by Val Violeta FALCON, RN Outcome: Progressing   Problem: Coping: Goal: Level of anxiety will decrease 03/12/2024 1631 by Val Violeta FALCON, RN Outcome: Adequate for Discharge 03/12/2024 1631 by Val Violeta FALCON, RN Outcome: Progressing   Problem: Elimination: Goal: Will not experience complications related to bowel motility 03/12/2024 1631 by Val Violeta FALCON, RN Outcome: Adequate for Discharge 03/12/2024 1631 by Val Violeta FALCON, RN Outcome: Progressing Goal: Will not experience complications related to urinary retention 03/12/2024 1631 by Val Violeta FALCON,  RN Outcome: Adequate for Discharge 03/12/2024 1631 by Val Violeta FALCON, RN Outcome: Progressing   Problem: Pain Managment: Goal: General experience of comfort will improve and/or be controlled 03/12/2024 1631 by Val Violeta FALCON, RN Outcome: Adequate for Discharge 03/12/2024 1631 by Val Violeta FALCON, RN Outcome: Progressing   Problem: Safety: Goal: Ability to remain free from injury will improve 03/12/2024 1631 by Val Violeta FALCON, RN Outcome: Adequate for Discharge 03/12/2024 1631 by Val Violeta FALCON, RN Outcome: Progressing   Problem: Skin Integrity: Goal: Risk for impaired skin integrity will decrease 03/12/2024 1631 by Val Violeta FALCON, RN Outcome: Adequate for Discharge 03/12/2024 1631 by Val Violeta FALCON, RN Outcome: Progressing

## 2024-03-12 NOTE — TOC Transition Note (Signed)
 Transition of Care [facility]) - Discharge Note   Patient Details  Name: Jerry [name] MRN: [id number] Date of Birth: 24-Sep-1970  [facility] CM/SW Contact:  Roxie KANDICE Stain, RN Phone Number: 03/12/2024, 4:10 PM   Clinical Narrative:    Patient stable for discharge. Wife will ride in ambulance to take care of the vent. Lynette with [facility]. Pam with [facility] will teach wife how to administer tube feedings.  Address, Phone number and PCP verified.   Final next level of care: Home w Home Health Services Barriers to Discharge: Barriers Resolved   Patient Goals and CMS Choice Patient states their goals for this hospitalization and ongoing recovery are:: wife wants patient to return home CMS Medicare.gov Compare Post Acute Care list provided to:: Patient Represenative (must comment) Choice offered to / list presented to : Spouse      Discharge Placement             home          Discharge Plan and Services Additional resources added to the After Visit Summary for                            [facility] Arranged: RN [facility] Agency: [facility] Date [facility] Agency Contacted: 03/02/24 Time HH Agency Contacted: [id number] Representative spoke with at [facility] Agency: Arna  Social Drivers of Health (SDOH) Interventions SDOH Screenings   Food Insecurity: No Food Insecurity (02/26/2024)  Housing: Low Risk  (02/26/2024)  Transportation Needs: No Transportation Needs (02/26/2024)  Utilities: Not At Risk (02/26/2024)  Alcohol  Screen: Low Risk  (09/04/2020)  Depression (PHQ2-9): Low Risk  (01/24/2024)  Financial Resource Strain: Low Risk  (09/29/2022)  Physical Activity: Inactive (10/11/2023)  Social Connections: Unknown (10/11/2023)  Stress: Stress Concern Present (10/11/2023)  Tobacco Use: Low Risk  (02/26/2024)  Health Literacy: Adequate Health Literacy (10/11/2023)     Readmission Risk Interventions    03/12/2024    4:10 PM  Readmission Risk Prevention Plan  Transportation Screening  Complete  PCP or Specialist Appt within 5-7 Days Complete  Home Care Screening Complete  Medication Review (RN CM) Complete

## 2024-03-12 NOTE — Progress Notes (Signed)
 NAME:  Jerry [name], MRN:  [id number], DOB:  19-Jul-1970, LOS: 15 ADMISSION DATE:  02/26/2024, CONSULTATION DATE: 02/22/2024 REFERRING MD:  Ruthe Cornet, , CHIEF COMPLAINT: Abdominal pain, nausea and vomiting  History of Present Illness:  53 year old male with advanced ALS, status post trach, vent dependent/PEG, on TPN due to not being able to tolerate tube feeds, who is being cared at home by his wife, was brought into the emergency department with complaint of abdominal pain, nausea and vomiting since yesterday.  Per patient's wife he was complaining of abdominal pain, had 3 episodes of nonbilious vomiting so she brought him here in the emergency department.  On workup he was noted to have possible acute cholecystitis, seen by general surgery recommend percutaneous cholecystostomy tube drainage.  PCCM was consulted for help evaluation and ventilator management Of note patient is spiking fever Tmax 101.7  Pertinent  Medical History   Past Medical History:  Diagnosis Date   ALS (amyotrophic lateral sclerosis) (HCC)    COVID-19 virus infection 06/2020   Meningitis 2003   spinal   Morbid obesity (HCC)    Neuromuscular disorder (HCC)    Prediabetes 11/05/2016   A1C 5.7 on 11/05/16   Type 2 diabetes mellitus (HCC) 08/31/2023    Significant Hospital Events: Including procedures, antibiotic start and stop dates in addition to other pertinent events   8/24 perc cholecystostomy by IR 8/26 persistent fever and leukocytosis 8/26 trickle tube feeds started 8/28 NPO for GJ tube exchanged. On TPN 9/7 Routine trach exchange #8 shiley flex  Interim History / Subjective:   Coordinated with RT for routine trach exchange yesterday Patient with no compaints Working with PT this morning Primary team planning to discharge  Objective    Blood pressure 95/77, pulse 100, temperature 99.1 F (37.3 C), temperature source Oral, resp. rate 16, height 5' 5 (1.651 m), weight 74.9 kg, SpO2 100%.    Vent  Mode: AC FiO2 (%):  [32 %] 32 % Set Rate:  [16 bmp] 16 bmp Vt Set:  [500 mL] 500 mL PEEP:  [5 cmH20] 5 cmH20 Plateau Pressure:  [17 cmH20-23 cmH20] 17 cmH20   Intake/Output Summary (Last 24 hours) at 03/12/2024 0825 Last data filed at 03/12/2024 0601 Gross per 24 hour  Intake 1500 ml  Output 1100 ml  Net 400 ml   Filed Weights   03/07/24 0402 03/08/24 0500 03/09/24 0349  Weight: 75.5 kg 74.6 kg 74.9 kg   Physical Exam: General: Chronically ill-appearing, no acute distress HENT: [place], AT, OP clear, MMM Eyes: EOMI, no scleral icterus Neck: #8 shiley flex in place, connected to home vent Respiratory: Clear to auscultation bilaterally.  No crackles, wheezing or rales Cardiovascular: RRR, -M/R/G, no JVD GI: BS+, soft, nontender Extremities:-Edema,-tenderness Neuro: Awake, intact ocular movements used to communicate with language device GU: Foley in place   Assessment and Plan    Chronic hypoxic respiratory failure status post trach, vent dependent Advanced ALS Respiratory culture > Achromobacter, Pseudomonas, stenotrophomonas, MSSA. Suspected colonizers - Continue current home vent settings. No changes. Minimal clear secretions present - Routine trach care - Respiratory cultures with colonization with Achromobacter, Pseudomonas, stenotrophomonas, MSSA without any clear evidence of bronchitis or pneumonia at this time - Following chest x-ray intermittently   Other medical issues addressed by primary team:  Sepsis due to Acute cholecystitis s/p Perc cholecystostomy tube placement by IR Advanced ALS Dysphagia status post PEG, not tolerating tube feeds, currently on TPN Severe protein calorie malnutrition   Care Time: 25 min Discussed  plan with RT, patient and RN  Slater Staff, M.D. [facility] Pulmonary/Critical Care Medicine 03/12/2024 8:25 AM   See Amion for personal pager For hours between 7 PM to 7 AM, please call Elink for urgent questions

## 2024-03-12 NOTE — Discharge Summary (Signed)
 Physician Discharge Summary  Jerry [name]:[id number] DOB: 13-Dec-1970 DOA: 02/26/2024  PCP: [facility]  Admit date: 02/26/2024 Discharge date: 03/12/24  Admitted From: Home Disposition: Home Recommendations for Outpatient Follow-up:  Outpatient follow-up with radiology as below. Check CMP and CBC in 1 week Please follow up on the following pending results: None  Home Health: [facility] RN Equipment/Devices: Tube feed supplies ordered.  Patient has appropriate DME's  Discharge Condition: Stable CODE STATUS: Full code Diet Orders (From admission, onward)     Start     Ordered   03/01/24 0001  Diet NPO time specified  Diet effective midnight        02/29/24 0957             Follow-up Information     [facility] IR Imaging Follow up.   Specialty: Radiology Why: Schedulers will call to arrange follow-up appt Contact information: [address] [place]  [address] [phone]        [place] Follow up.   Specialty: Home Health Services Why: Home health RN Contact information: [address] [place] [address] [place] [address] [phone]                 Hospital course 53 year old male with advanced ALS s/p trach with vent dependence, PEG, but on TPN as unable to tolerate tube feeds, brought to ED by EMS with complaint of abdominal pain, nausea and vomiting for 1 day.  Per wife he had 3 episodes of nonbilious vomiting. On workup he was noted to have possible acute cholecystitis, seen by general surgery and cholecystostomy tube was placed by IR 02/26/24, continued on Zosyn  therapy.  He also underwent G tube to GJ tube conversion by IR 8/28.  Restarted on tube feed that he tolerated.  Eventually came off TPN.  He was transferred to [facility] service on 03/06/2024.    Patient completed antibiotic course with IV Zosyn .  He remained stable and continue to tolerate tube feed.  He is discharged home on tube feed.   See  individual problem list below for more.   Problems addressed during this hospitalization Sepsis due to acute cholecystitis: Present on admission. -S/p percutaneous cholecystostomy tube on 8/24 -Completed 10 days of IV Zosyn  -Follow chole tube output, daily flushes as ordered. - Outpatient follow-up with IR.   Chronic hypoxic respiratory failure: S/p trach, Vent dependant -PCCM follow up for vent management appreciated. -Continue ventilator support, he seems at baseline settings.  -Continue tracheostomy care. -Respiratory cultures possible colonization, no signs of infection.   Advanced ALS with vent and feeding tube dependence -G-tube converted to GJ tube by IR on 8/28 -Tolerating tube feed and free water .  Off TPN now.  -Communication by using eye tracking system at bedside. -Continue Robinul  and scopolamine  patch for secretion  -Continue Reglan  and mestinon  for motility.  - Check sodium and electrolytes in 1 week   Normocytic anemia: Hgb stable after initial drop.  No overt bleeding. - Check CBC in 1 week   Leukocytosis:  Completed antibiotic course for infection.  Resolving -Check CBC in 1 week   Hyponatremia: Resolved.   Hyperkalemia: Resolved.   Hypokalemia/hypophosphatemia: Resolved.   High anion gap metabolic acidosis: Resolved.   Hyperglycemia:  A1c 5.2%.  CBG within acceptable range.  Doubt need for home insulin  or TF coverage.   Pruritus: Resolved.   Inadequate oral intake Body mass index is 27.48 kg/m. Nutrition Problem: Inadequate oral intake Etiology: inability to eat,  altered GI function Signs/Symptoms: NPO status Interventions: Refer to RD note for recommendations     Consultations: Critical care General Surgery Interventional radiology  Time spent 35  minutes  Vital signs Vitals:   03/12/24 0800 03/12/24 0801 03/12/24 0900 03/12/24 1000  BP: 110/74  106/75 102/67  Pulse: 100  (!) 107 100  Temp:      Resp: 16  17 16   Height:       Weight:      SpO2: 100% 100% 100% 99%  TempSrc:      BMI (Calculated):         Discharge exam  GENERAL: No apparent distress.  Nontoxic. HEENT: MMM.  Vision and hearing grossly intact.  NECK: Tracheostomy in place.  On the vent. RESP:  No IWOB.  Fair aeration bilaterally. CVS:  RRR. Heart sounds normal.  ABD/GI/GU: BS+. Abd soft, NTND.  Cholecystostomy drain.  GJ tube in place. MSK/EXT: Quadriplegia NEURO: AA.  Not verbal.   Responds yes or no by blinking.  Also uses eye tracking device for communication.  Quadriplegia. PSYCH: Calm. Normal affect.     Discharge Instructions  Allergies as of 03/12/2024       Reactions   Crab [shellfish Allergy] Rash   Confirmed no allergy to fish with wife 02/28/24        Medication List     STOP taking these medications    PRESCRIPTION MEDICATION       TAKE these medications    cetirizine  HCl 5 MG/5ML Soln Commonly known as: Zyrtec  Take 10 mLs (10 mg total) by mouth at bedtime.   Clear Eyes Natural Tears 5-6 MG/ML Soln Generic drug: Polyvinyl Alcohol -Povidone Apply 1 drop to eye 3 (three) times daily as needed.   feeding supplement (OSMOLITE 1.2 CAL) Liqd Place 50 mL/hr into feeding tube continuous.   feeding supplement (PROSource TF20) liquid Place 60 mLs into feeding tube daily. Start taking on: March 13, 2024   free water  Soln Place 125 mLs into feeding tube every 6 (six) hours.   glycopyrrolate  1 MG tablet Commonly known as: ROBINUL  Place 1 tablet (1 mg total) into feeding tube 2 (two) times daily as needed. What changed: when to take this   ipratropium-albuterol  0.5-2.5 (3) MG/3ML Soln Commonly known as: DUONEB USE 3 ML VIA NEBULIZER EVERY 4 HOURS AS NEEDED What changed: See the new instructions.   metoCLOPramide  5 MG/5ML solution Commonly known as: REGLAN  Place 10 mLs (10 mg total) into feeding tube 3 (three) times daily.   polyethylene glycol powder 17 GM/SCOOP powder Commonly known as:  GLYCOLAX /MIRALAX  Place 17 g into feeding tube daily. Titrate as needed for constipation   Potassium Chloride  40 MEQ/15ML (20%) Soln TAKE 15 ML BY MOUTH TWICE DAILY   pyridostigmine  60 MG/5ML solution Commonly known as: MESTINON  Place 2.5 mLs (30 mg total) into feeding tube 2 (two) times daily.   scopolamine  1 MG/3DAYS Commonly known as: TRANSDERM-SCOP PLACE 1 PATCH ONTO THE SKIN EVERY 3 DAYS   Simethicone  40 MG/0.6ML Liqd 1.2 mLs (80 mg total) by Per J Tube route in the morning, at noon, in the evening, and at bedtime.   sodium chloride  flush 0.9 % Soln Commonly known as: NS 10-40 mLs by Intracatheter route every 12 (twelve) hours.   triamcinolone  cream 0.1 % Commonly known as: KENALOG  Apply 1 Application topically 2 (two) times daily.         Procedures/Studies: 8/24-cholecystostomy drain 8/28-G-tube GJ conversion   DG Abd 1 View Result Date: 03/08/2024  CLINICAL DATA:  Abdominal distension. EXAM: ABDOMEN - 1 VIEW COMPARISON:  Radiograph dated 03/03/2024. FINDINGS: Support catheters in similar position. Significant improvement in the small bowel dilatation. There is diffuse air distension of the colon. No free air. No acute osseous pathology. IMPRESSION: Significant improvement in the small bowel dilatation. Electronically Signed   By: Vanetta Chou M.D.   On: 03/08/2024 21:34   DG Abd 1 View Result Date: 03/03/2024 CLINICAL DATA:  Abdominal distention EXAM: ABDOMEN - 1 VIEW COMPARISON:  Abdominal x-ray 06/05/2023. Chest abdomen and pelvis 02/26/2024. FINDINGS: There are new dilated air-filled small bowel loops throughout the abdomen measuring 4.3 cm. Minimal colonic gas present. Cholecystostomy tube again seen. Second catheter is coiled over the mid abdomen. Central venous catheter tip projects over the mid right atrium. There are phleboliths in the pelvis. No acute osseous abnormality identified. IMPRESSION: New dilated air-filled small bowel loops throughout the abdomen  concerning for small bowel obstruction. Electronically Signed   By: Greig Pique M.D.   On: 03/03/2024 17:19   IR GJ Tube Change Result Date: 03/01/2024 INDICATION: Indwelling percutaneous gastrostomy tube in a patient now requiring GJ tube for feeding and nutritional support. EXAM: Gastrostomy drain exchange ANESTHESIA/SEDATION: None CONTRAST:  40 mL Omnipaque  300-administered into the gastric lumen. FLUOROSCOPY: Radiation Exposure Index (as provided by the fluoroscopic device): 12 mGy Kerma COMPLICATIONS: None immediate. PROCEDURE: Informed written consent was obtained from the patient after a thorough discussion of the procedural risks, benefits and alternatives. All questions were addressed. Maximal Sterile Barrier Technique was utilized including caps, mask, sterile gowns, sterile gloves, sterile drape, hand hygiene and skin antiseptic. A timeout was performed prior to the initiation of the procedure. In a supine position, the epigastric region and external portion of the percutaneous gastrostomy tube were prepped draped usual sterile fashion. Dilute contrast was then injected under fluoroscopic guidance into the gastric port. Contrast can be seen pooling in the fundus of the stomach. The retention balloon was deflated and a guidewire was advanced into the gastric tube. Gastric tube was then removed from the patient. Kumpe catheter was then advanced over the Glidewire and using fluoroscopic guidance the catheter and guidewire were then advanced into the duodenal and then into the proximal jejunum. Positioning was checked by injecting contrast into the proximal jejunum. The guidewire was then readvanced and the catheter was removed. A new 24 French gastrostomy tube with an Enfit hub was then advanced over the guidewire. The jejunostomy port is 19 [place]. The catheter was advanced until the distal tip was identified in the proximal jejunum. Contrast was then injected into the gastric and jejunostomy ports  verifying position. Both portions of the catheter were then flushed with normal saline. 10 mL of normal saline was injected into the retention balloon and the [facility] disc was advanced to closely approximate the skin. IMPRESSION: Exchange of a 38 French gastrostomy tube for a 24 [place] gastrostomy tube/18 [place] jejunostomy tube with an Enfit hub was performed under fluoroscopic guidance. Electronically Signed   By: Cordella Banner   On: 03/01/2024 12:42   IR CHOLANGIOGRAM EXISTING TUBE Result Date: 02/29/2024 INDICATION: 53 year old male with history of acute acalculous cholecystitis status post percutaneous cholecystostomy tube placement on 02/26/2024. EXAM: FLUOROSCOPIC GUIDED CHOLECYSTOSTOMY TUBE INJECTION COMPARISON:  02/26/2024 MEDICATIONS: None ANESTHESIA/SEDATION: None CONTRAST:  10mL OMNIPAQUE  IOHEXOL  300 MG/ML SOLN - administered into the gallbladder fossa. FLUOROSCOPY TIME:  Six mGy reference air kerma COMPLICATIONS: None immediate. PROCEDURE: The patient was positioned supine on the fluoroscopy table. A preprocedural  spot fluoroscopic image was obtained of the right upper abdominal quadrant existing cholecystostomy tube. Multiple spot fluoroscopic radiographic images were obtained of the right upper abdominal quadrant an existing cholecystostomy tube following injection of a small amount of contrast. The cholecystostomy tube was flushed with a small amount of saline and capped. A dressing was placed. The patient tolerated the procedure well without immediate postprocedural complication. FINDINGS: Well positioned indwelling right upper quadrant percutaneous cholecystostomy tube with the pigtail portion coiled near the gallbladder infundibulum. The cystic duct remains occluded. IMPRESSION: 1. Appropriately positioned and functioning cholecystostomy tube. No exchange performed. 2. No evidence of cholelithiasis or choledocholithiasis. 3. Occluded cystic duct. Ester Sides, MD [facility] Radiology Electronically Signed   By: Ester Sides M.D.   On: 02/29/2024 22:30   IR Perc Cholecystostomy Result Date: 02/28/2024 CLINICAL DATA:  Abdominal pain. CT shows chronic Gallbladder Hydrops, but new simple density free fluid in the abdomen, indistinct appearance of the gallbladder wall, with layering sludge or stones. Constellation highly suspicious for Acute Cholecystitis. EXAM: PERCUTANEOUS CHOLECYSTOSTOMY TUBE PLACEMENT WITH ULTRASOUND AND FLUOROSCOPIC GUIDANCE FLUOROSCOPY: Radiation Exposure Index (as provided by the fluoroscopic device): 3.2 mGy air Kerma TECHNIQUE: The procedure, risks (including but not limited to bleeding, infection, organ damage ), benefits, and alternatives were explained to the patient. Questions regarding the procedure were encouraged and answered. The patient understands and consents to the procedure. Survey ultrasound of the abdomen was performed and an appropriate skin entry site was identified. Skin site was marked, prepped with chlorhexidine , and draped in usual sterile fashion, and infiltrated locally with 1% lidocaine . Intravenous Fentanyl  100mcg and Versed  2mg  were administered by RN during a total moderate (conscious) sedation time of 12 minutes; the patient's level of consciousness and physiological / cardiorespiratory status were monitored continuously by radiology RN under my direct supervision. Under real-time ultrasound guidance, gallbladder was accessed using a transhepatic approach with a 21-gauge needle. Ultrasound image documentation was saved. Bile returned through the hub. Needle was exchanged over a 018 guidewire for transitional dilator which allowed placement of 035 J wire. Over this, a 10.2 French pigtail catheter was advanced and formed centrally in the gallbladder lumen. Small contrast injection confirmed appropriate position. Catheter secured externally with 0 Prolene suture and placed external drain bag. Patient  tolerated the procedure well. COMPLICATIONS: COMPLICATIONS none IMPRESSION: 1. Technically successful percutaneous cholecystostomy tube placement with ultrasound and fluoroscopic guidance. Electronically Signed   By: [name] M.D.   On: 02/28/2024 07:56   CT CHEST ABDOMEN PELVIS W CONTRAST Result Date: 02/26/2024 CLINICAL DATA:  53 year old male with sepsis. Abdominal distension. Hypertension. EXAM: CT CHEST, ABDOMEN, AND PELVIS WITH CONTRAST TECHNIQUE: Multidetector CT imaging of the chest, abdomen and pelvis was performed following the standard protocol during bolus administration of intravenous contrast. RADIATION DOSE REDUCTION: This exam was performed according to the departmental dose-optimization program which includes automated exposure control, adjustment of the mA and/or kV according to patient size and/or use of iterative reconstruction technique. CONTRAST:  75mL OMNIPAQUE  IOHEXOL  350 MG/ML SOLN COMPARISON:  Portable chest 0419 hours today. CTA chest 06/05/2023 and CT Abdomen and Pelvis 09/16/2023. FINDINGS: CT CHEST FINDINGS Cardiovascular: Chronic right chest Port-A-Cath. Normal heart size. No pericardial effusion. Aberrant origin right subclavian artery, normal variant. Little to no thoracic atherosclerosis identified. Mediastinum/Nodes: Negative for mediastinal mass, lymphadenopathy. Lungs/Pleura: Chronic tracheostomy is stable from last year. Small volume layering secretions in the trachea near the tube. Otherwise major airways are patent. Lung volumes are stable, chronic  elevation of the left hemidiaphragm. Chronic left lung base atelectasis and small volume pleural effusion or pleural thickening, unchanged from last year. Mild respiratory motion. Mild contralateral dependent right lung atelectasis. No acute lung finding. Musculoskeletal: Stable.  No acute or suspicious osseous lesion. CT ABDOMEN PELVIS FINDINGS Hepatobiliary: Chronic gallbladder hydrops, but new perihepatic, pericholecystic  free fluid (series 6, image 53). Layering sludge or stones series 3, image 83 is newly visible, and elsewhere the gallbladder wall appears indistinct (series 3, image 64). Liver enhancement remains normal. No bile duct dilatation. Pancreas: Stable atrophy. Spleen: Trace perisplenic free fluid is new. Otherwise stable and negative spleen. Adrenals/Urinary Tract: Normal adrenal glands. Bilateral nephrolithiasis. Symmetric renal enhancement and contrast excretion with no hydronephrosis or hydroureter. Distended urinary bladder. And there is a 5 mm calculus within the bladder now, to the left of midline dependently medial to the left UVJ. See series 3, image 104. The distal ureters and ureterovesical junctions are decompressed and negative. Stomach/Bowel: Featureless appearance of the rectosigmoid colon similar to March. Redundant large bowel. Upstream proximal sigmoid, descending colon are decompressed. Gas distended non dependent transverse colon also containing some fluid. Upstream right colon is decompressed. Mildly gas and stool distended cecum. Cecum is on a lax mesentery. Terminal ileum is decompressed. Gas containing appendix remains normal near the midline on series 3, image 86. Nondilated small bowel. Percutaneous gastrostomy tube is new. Air and fluid within the stomach. Decompressed duodenum. No pneumoperitoneum identified. Free fluid with simple fluid density including tracking into the bilateral lower quadrants (coronal image 52). Vascular/Lymphatic: Normal caliber abdominal aorta. Major arterial structures, portal venous system are patent. No significant atherosclerosis. No lymphadenopathy. Reproductive: Negative. Other: No pelvis free fluid. Musculoskeletal: Stable. Chronic lower abdominal, pelvic and visible proximal lower extremity muscle atrophy which is diffuse, symmetric. No acute or suspicious osseous lesion. IMPRESSION: 1. Chronic Gallbladder Hydrops, but new simple density free fluid in the  abdomen, indistinct appearance of the gallbladder wall, with layering sludge or stones. Constellation highly suspicious for Acute Cholecystitis. 2. No other definite acute or inflammatory process in the abdomen or pelvis; Redundant large bowel with featureless appearance of the rectosigmoid colon similar to March. No evidence of bowel obstruction.  New PEG tube.  Normal appendix. Bilateral nephrolithiasis, and new 5 mm urinary calculus within the bladder, but no acute obstructive uropathy. Chronic left lung base fibrothorax versus pleural effusion and atelectasis. Chronic tracheostomy. Generalized lower abdominal, pelvic, and proximal lower extremity muscle atrophy. Electronically Signed   By: VEAR Hurst M.D.   On: 02/26/2024 12:41   DG Chest Portable 1 View Result Date: 02/26/2024 CLINICAL DATA:  Fever. EXAM: PORTABLE CHEST 1 VIEW COMPARISON:  09/16/2023 FINDINGS: Low volume film. The cardio pericardial silhouette is enlarged. Tracheostomy tube remains in place. Right-sided Port-A-Cath tip overlies the right atrium. Left PICC line tip overlies the upper right atrium. But The lungs are clear without focal pneumonia, edema, pneumothorax or pleural effusion. Telemetry leads overlie the chest. IMPRESSION: Low volume film without acute cardiopulmonary findings. Electronically Signed   By: Camellia Candle M.D.   On: 02/26/2024 06:37       The results of significant diagnostics from this hospitalization (including imaging, microbiology, ancillary and laboratory) are listed below for reference.     Microbiology: No results found for this or any previous visit (from the past 240 hours).   Labs:  CBC: Recent Labs  Lab 03/07/24 0500 03/08/24 0356 03/09/24 0404 03/11/24 0226 03/12/24 0225  WBC 13.7* 13.2* 13.2* 11.6* 10.7*  HGB  8.0* 8.2* 7.9* 8.9* 8.3*  HCT 26.0* 27.1* 25.4* 28.7* 27.1*  MCV 87.5 89.7 87.9 87.5 87.7  PLT 455* 364 449* 495* 446*   BMP &GFR Recent Labs  Lab 03/07/24 0500  03/08/24 0356 03/09/24 0404 03/11/24 0226 03/12/24 0225  NA 142 143 143 141 140  K 3.6 3.8 3.9 3.9 3.8  CL 113* 112* 115* 109 106  CO2 20* 19* 20* 22 21*  GLUCOSE 129* 106* 138* 97 77  BUN 12 9 13 17 17   CREATININE <0.30* <0.30* <0.30* <0.30* <0.30*  CALCIUM  9.2 9.2 9.2 9.6 9.5  MG  --   --   --  1.9 2.0  PHOS  --   --   --   --  3.1   CrCl cannot be calculated (This lab value cannot be used to calculate CrCl because it is not a number: <0.30). Liver & Pancreas: Recent Labs  Lab 03/07/24 0500 03/08/24 0356 03/09/24 0404 03/11/24 0226 03/12/24 0225  AST 37 31 29 23   --   ALT 48* 44 40 33  --   ALKPHOS 414* 416* 362* 341*  --   BILITOT 1.1 1.3* 1.2 1.3*  --   PROT 7.4 7.4 7.5 8.1  --   ALBUMIN 2.5* 2.6* 2.7* 2.9* 2.8*   No results for input(s): LIPASE, AMYLASE in the last 168 hours. No results for input(s): AMMONIA in the last 168 hours. Diabetic: No results for input(s): HGBA1C in the last 72 hours. Recent Labs  Lab 03/11/24 1515 03/11/24 1935 03/11/24 2353 03/12/24 0410 03/12/24 0738  GLUCAP 117* 118* 128* 95 117*   Cardiac Enzymes: No results for input(s): CKTOTAL, CKMB, CKMBINDEX, TROPONINI in the last 168 hours. No results for input(s): PROBNP in the last 8760 hours. Coagulation Profile: No results for input(s): INR, PROTIME in the last 168 hours. Thyroid Function Tests: No results for input(s): TSH, T4TOTAL, FREET4, T3FREE, THYROIDAB in the last 72 hours. Lipid Profile: Recent Labs    03/12/24 0225  TRIG 102   Anemia Panel: No results for input(s): VITAMINB12, FOLATE, FERRITIN, TIBC, IRON, RETICCTPCT in the last 72 hours. Urine analysis:    Component Value Date/Time   COLORURINE AMBER (A) 02/26/2024 0343   APPEARANCEUR HAZY (A) 02/26/2024 0343   LABSPEC >1.046 (H) 02/26/2024 0343   PHURINE 7.0 02/26/2024 0343   GLUCOSEU 50 (A) 02/26/2024 0343   HGBUR NEGATIVE 02/26/2024 0343   BILIRUBINUR NEGATIVE  02/26/2024 0343   KETONESUR 5 (A) 02/26/2024 0343   PROTEINUR 30 (A) 02/26/2024 0343   NITRITE NEGATIVE 02/26/2024 0343   LEUKOCYTESUR MODERATE (A) 02/26/2024 0343   Sepsis Labs: Invalid input(s): PROCALCITONIN, LACTICIDVEN   SIGNED:  Odalys Win T Cordera Stineman, MD  [facility] 03/12/2024, 10:54 AM

## 2024-03-13 ENCOUNTER — Telehealth (HOSPITAL_COMMUNITY): Payer: Self-pay

## 2024-03-13 ENCOUNTER — Other Ambulatory Visit: Payer: Self-pay | Admitting: Student

## 2024-03-13 DIAGNOSIS — D638 Anemia in other chronic diseases classified elsewhere: Secondary | ICD-10-CM | POA: Diagnosis not present

## 2024-03-13 DIAGNOSIS — E46 Unspecified protein-calorie malnutrition: Secondary | ICD-10-CM | POA: Diagnosis not present

## 2024-03-13 DIAGNOSIS — J9612 Chronic respiratory failure with hypercapnia: Secondary | ICD-10-CM | POA: Diagnosis not present

## 2024-03-13 DIAGNOSIS — R1310 Dysphagia, unspecified: Secondary | ICD-10-CM | POA: Diagnosis not present

## 2024-03-13 DIAGNOSIS — F419 Anxiety disorder, unspecified: Secondary | ICD-10-CM | POA: Diagnosis not present

## 2024-03-13 DIAGNOSIS — G8250 Quadriplegia, unspecified: Secondary | ICD-10-CM | POA: Diagnosis not present

## 2024-03-13 DIAGNOSIS — G1221 Amyotrophic lateral sclerosis: Secondary | ICD-10-CM | POA: Diagnosis not present

## 2024-03-13 DIAGNOSIS — J9611 Chronic respiratory failure with hypoxia: Secondary | ICD-10-CM | POA: Diagnosis not present

## 2024-03-13 DIAGNOSIS — K599 Functional intestinal disorder, unspecified: Secondary | ICD-10-CM

## 2024-03-13 MED ORDER — METOCLOPRAMIDE HCL 10 MG PO TABS: 10.0000 mg | ORAL_TABLET | Freq: Three times a day (TID) | ORAL | 0 refills | Status: DC

## 2024-03-13 NOTE — Telephone Encounter (Signed)
 Called to schedule cholangiogram, no answer, left vm. AB

## 2024-03-13 NOTE — Progress Notes (Signed)
 Notified that pharmacy reglan  solution is not available. Changed to Reglan  tablet to be crushed and given by J-tube

## 2024-03-19 IMAGING — CT CT ANGIO CHEST
2 of 6 series · 16 of 36 positions shown · IV contrast (agent unspecified)
Comparison: None Available.
COMPARISON: None Available.

Addendum:
CLINICAL DATA: Hypoxia tonight. Less responsive. Hypotension and
tachycardia. History of ALS



[Series 8: pe thins · axial · 0.71mm/px · z∈[+1162,+1372]mm · 15 of 238 slices shown]
[im 14/238  lung]
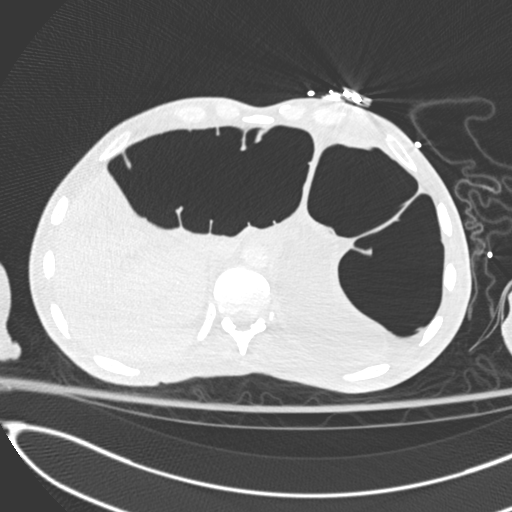
[im 27/238  mediastinal]
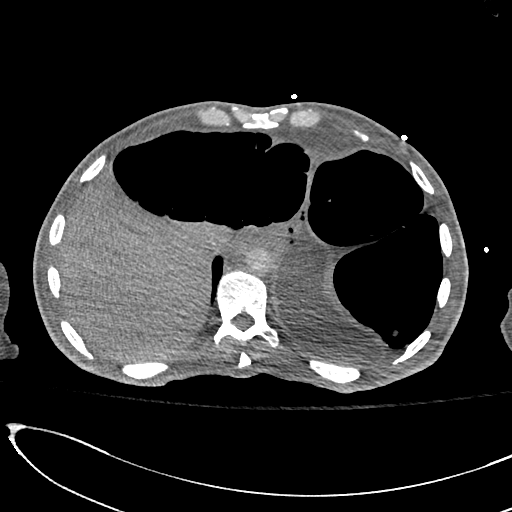
[im 40/238  lung]
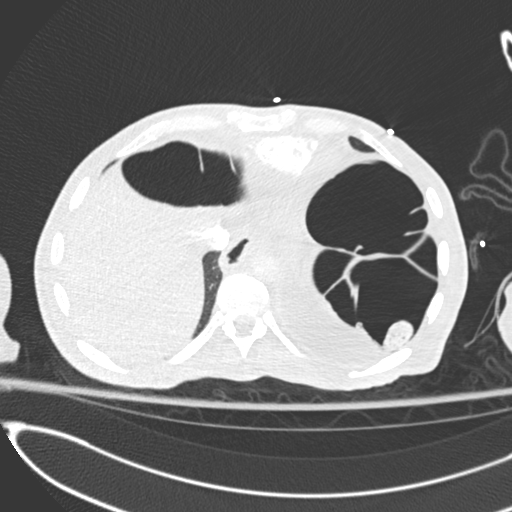
[im 53/238  mediastinal]
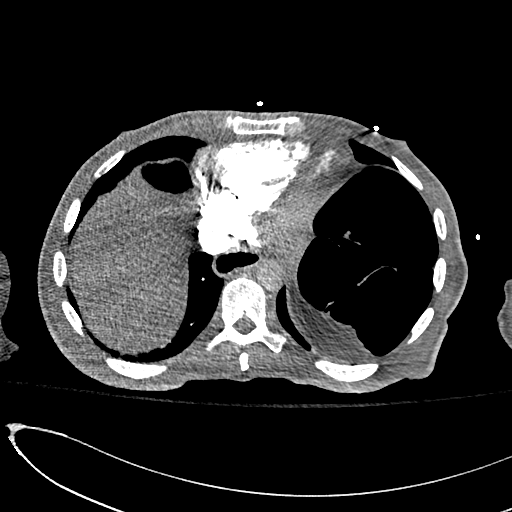
[im 80/238  lung]
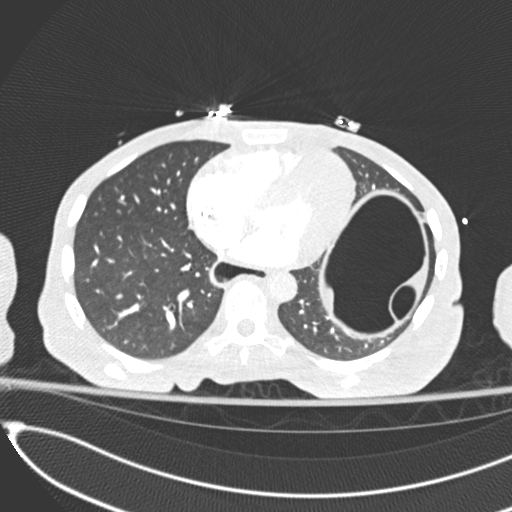
[im 93/238  mediastinal]
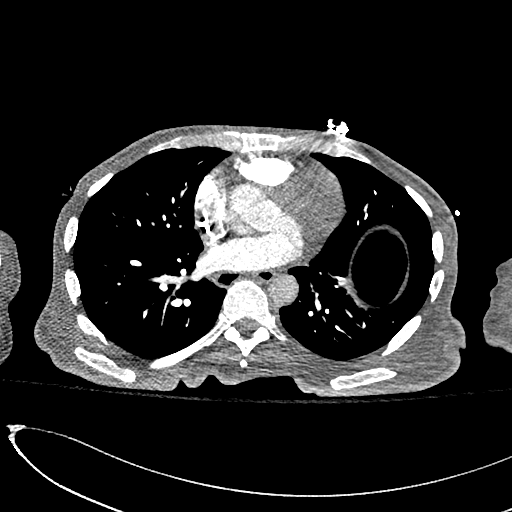
[im 106/238  lung]
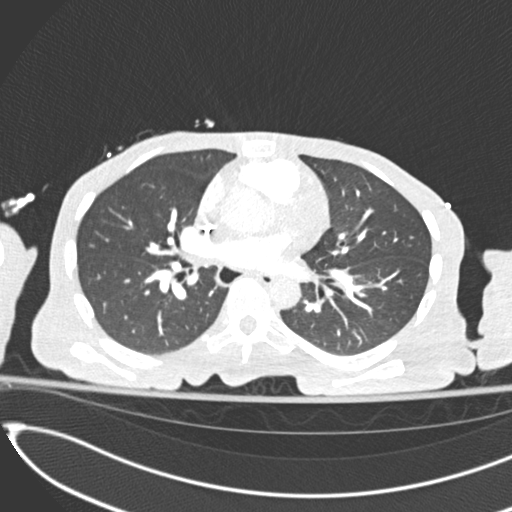
[im 119/238  mediastinal]
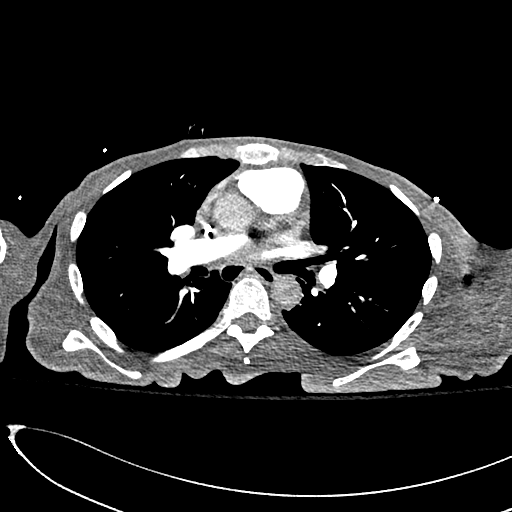
[im 132/238  lung]
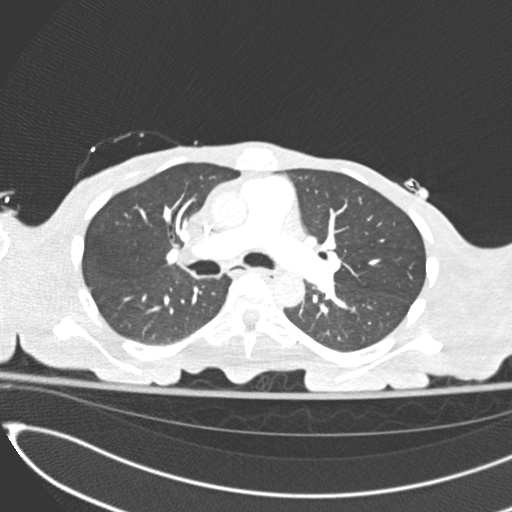
[im 145/238  mediastinal]
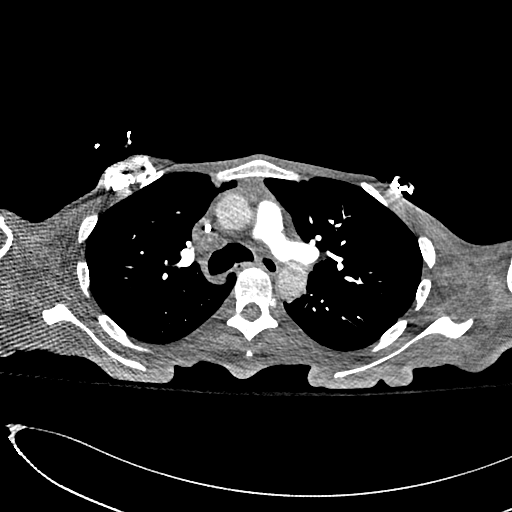
[im 159/238  lung]
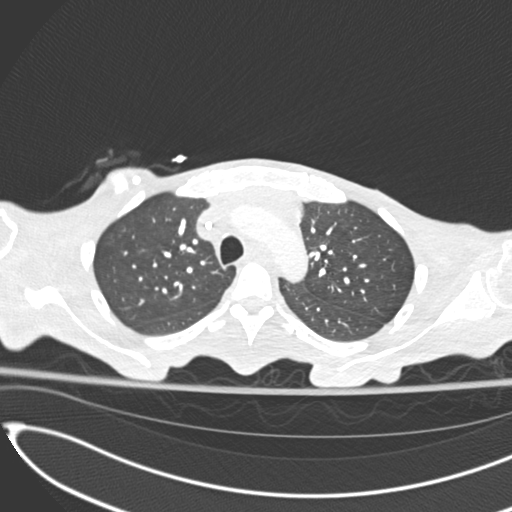
[im 185/238  mediastinal]
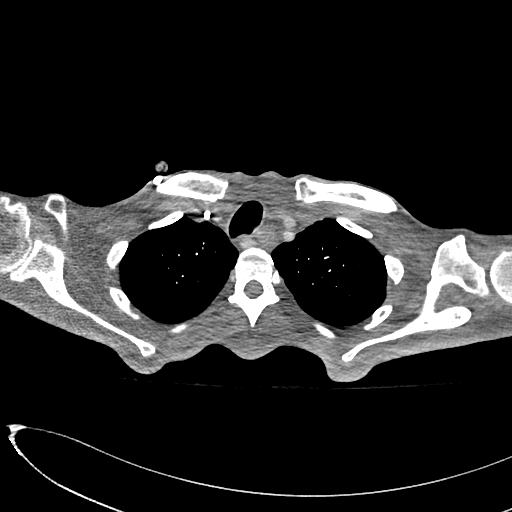
[im 198/238  lung]
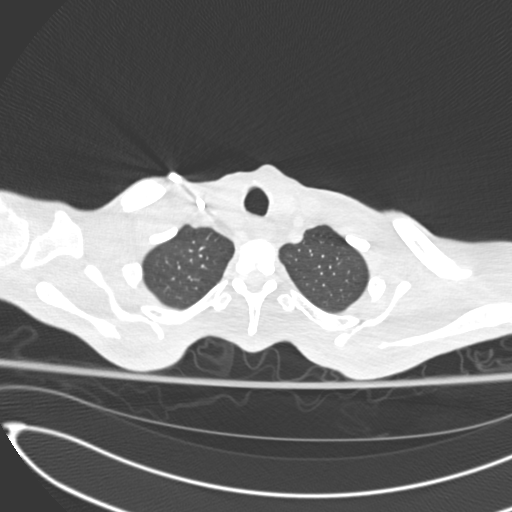
[im 211/238  mediastinal]
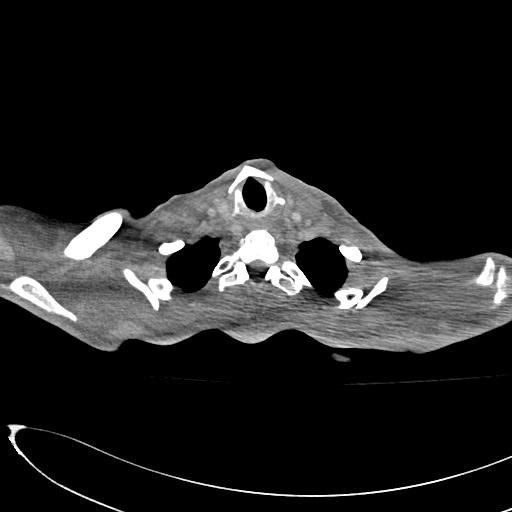
[im 224/238  lung]
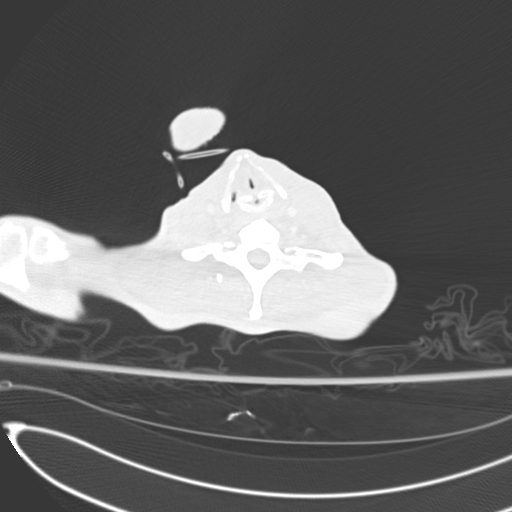

[Series 9: pe 2mm cor · coronal · 0.47mm/px · 1 of 103 slices shown]
[im 52/103  mediastinal]
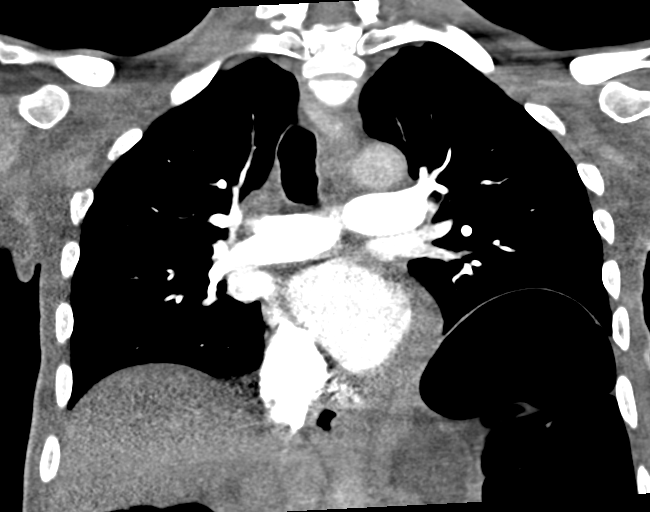

[16 of 36 positions shown; findings below may reference images not displayed]

RADIATION DOSE REDUCTION: This exam was performed according to the
departmental dose-optimization program which includes automated
exposure control, adjustment of the mA and/or kV according to
patient size and/or use of iterative reconstruction technique.

CONTRAST:  80mL OMNIPAQUE IOHEXOL 350 MG/ML SOLN
FINDINGS: CTA CHEST FINDINGS

Cardiovascular: Satisfactory opacification of the pulmonary arteries
to the segmental level. No evidence of pulmonary embolism. Normal
heart size, although there may be left ventricular hypertrophy. No
pericardial effusion.

Mediastinum/Nodes: Negative for adenopathy or mass

Lungs/Pleura: There is no edema, consolidation, effusion, or
pneumothorax.

Musculoskeletal: Cachexia appearance.

Review of the MIP images confirms the above findings.

CT ABDOMEN and PELVIS FINDINGS

Hepatobiliary: Somewhat heterogeneous enhancement of the ventral
liver, likely perfusional.No evidence of biliary obstruction or
stone.

Pancreas: Unremarkable.

Spleen: Unremarkable.

Adrenals/Urinary Tract: Negative adrenals. No hydronephrosis or
stone. Unremarkable bladder.

Stomach/Bowel: Diffuse gas dilated colon without volvulus. Stool
mainly in the rectum measuring up to 5.5 cm in diameter. Proximal
colonic distension up to 9 cm diameter. No pneumatosis or
perforation is seen. No small bowel obstruction. Located
percutaneous gastrostomy tube.

Vascular/Lymphatic: No acute vascular abnormality. No mass or
adenopathy.

Reproductive:There appears to be a Foley catheter with balloon
inflated at the level of the posterior penile urethra where there is
bulbous low-density appearance.

Other: No ascites or pneumoperitoneum.

Musculoskeletal: Cachectic appearance

Review of the MIP images confirms the above findings.
IMPRESSION: Chest CTA:

No acute finding, including pulmonary embolism.

Abdominal CT:

1. Diffuse gas dilated colon measuring up to 9 cm in diameter,
consistent with adynamic ileus.
2. Rectal stool measuring up to 5.5 cm in diameter.
3. Suspect Foley catheter inflated in the penile urethra.

ADDENDUM:
Case discussed with Dr. Kandjemuni. A Foley catheter is not present and
tubular low-density is presumably urine distending the urethra.

*** End of Addendum ***
RADIATION DOSE REDUCTION: This exam was performed according to the
departmental dose-optimization program which includes automated
exposure control, adjustment of the mA and/or kV according to
patient size and/or use of iterative reconstruction technique.

CONTRAST:  80mL OMNIPAQUE IOHEXOL 350 MG/ML SOLN
FINDINGS: CTA CHEST FINDINGS

Cardiovascular: Satisfactory opacification of the pulmonary arteries
to the segmental level. No evidence of pulmonary embolism. Normal
heart size, although there may be left ventricular hypertrophy. No
pericardial effusion.

Mediastinum/Nodes: Negative for adenopathy or mass

Lungs/Pleura: There is no edema, consolidation, effusion, or
pneumothorax.

Musculoskeletal: Cachexia appearance.

Review of the MIP images confirms the above findings.

CT ABDOMEN and PELVIS FINDINGS

Hepatobiliary: Somewhat heterogeneous enhancement of the ventral
liver, likely perfusional.No evidence of biliary obstruction or
stone.

Pancreas: Unremarkable.

Spleen: Unremarkable.

Adrenals/Urinary Tract: Negative adrenals. No hydronephrosis or
stone. Unremarkable bladder.

Stomach/Bowel: Diffuse gas dilated colon without volvulus. Stool
mainly in the rectum measuring up to 5.5 cm in diameter. Proximal
colonic distension up to 9 cm diameter. No pneumatosis or
perforation is seen. No small bowel obstruction. Located
percutaneous gastrostomy tube.

Vascular/Lymphatic: No acute vascular abnormality. No mass or
adenopathy.

Reproductive:There appears to be a Foley catheter with balloon
inflated at the level of the posterior penile urethra where there is
bulbous low-density appearance.

Other: No ascites or pneumoperitoneum.

Musculoskeletal: Cachectic appearance

Review of the MIP images confirms the above findings.
IMPRESSION: Chest CTA:

No acute finding, including pulmonary embolism.

Abdominal CT:

1. Diffuse gas dilated colon measuring up to 9 cm in diameter,
consistent with adynamic ileus.
2. Rectal stool measuring up to 5.5 cm in diameter.
3. Suspect Foley catheter inflated in the penile urethra.

## 2024-03-19 IMAGING — DX DG CHEST 1V PORT
1 series · 2 of 2 positions shown · non-contrast
Comparison: 12/09/2021

CLINICAL DATA: Intubation

EXAM:
PORTABLE CHEST 1 VIEW

[Series 1: chest · 0.14mm/px · 2 of 2 slices shown]
[im 1/2]
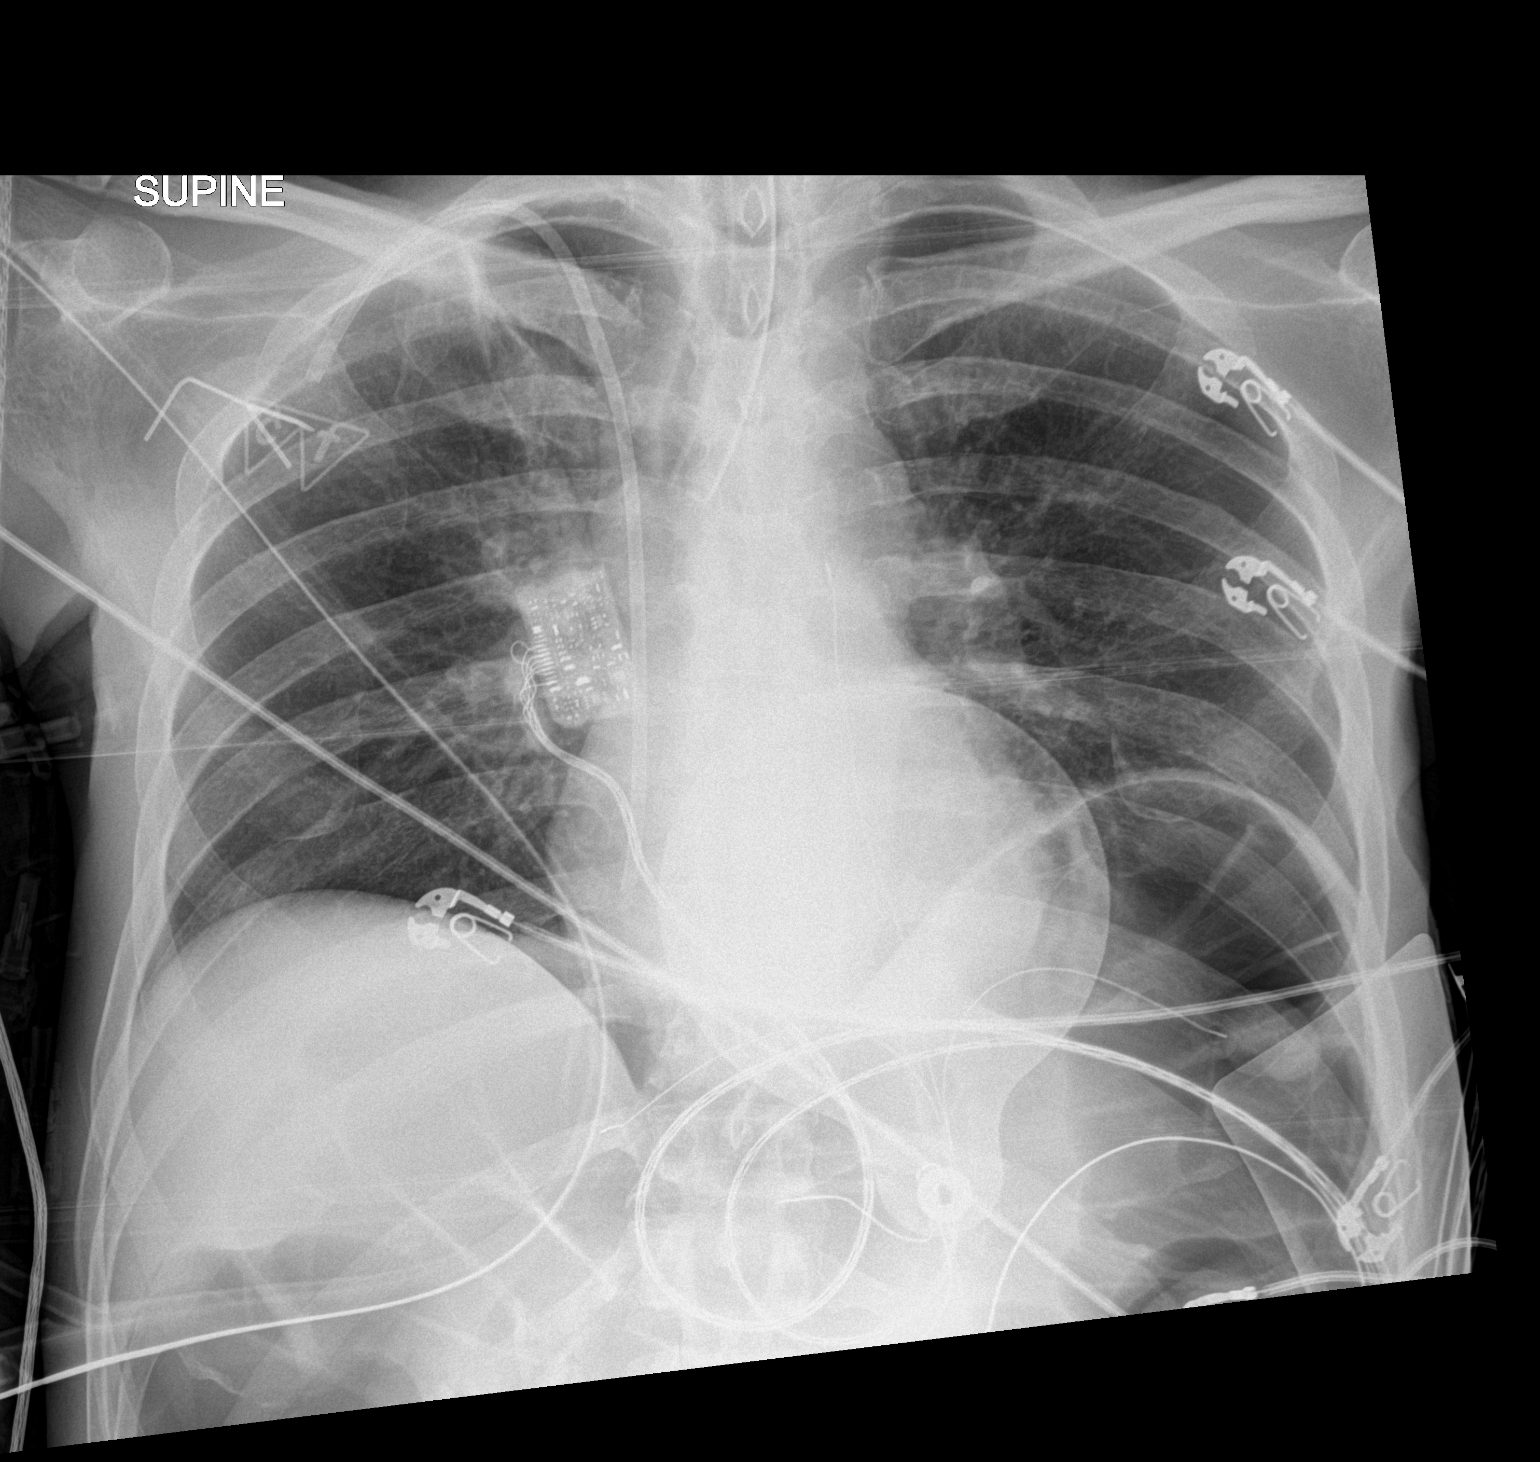
[im 2/2]
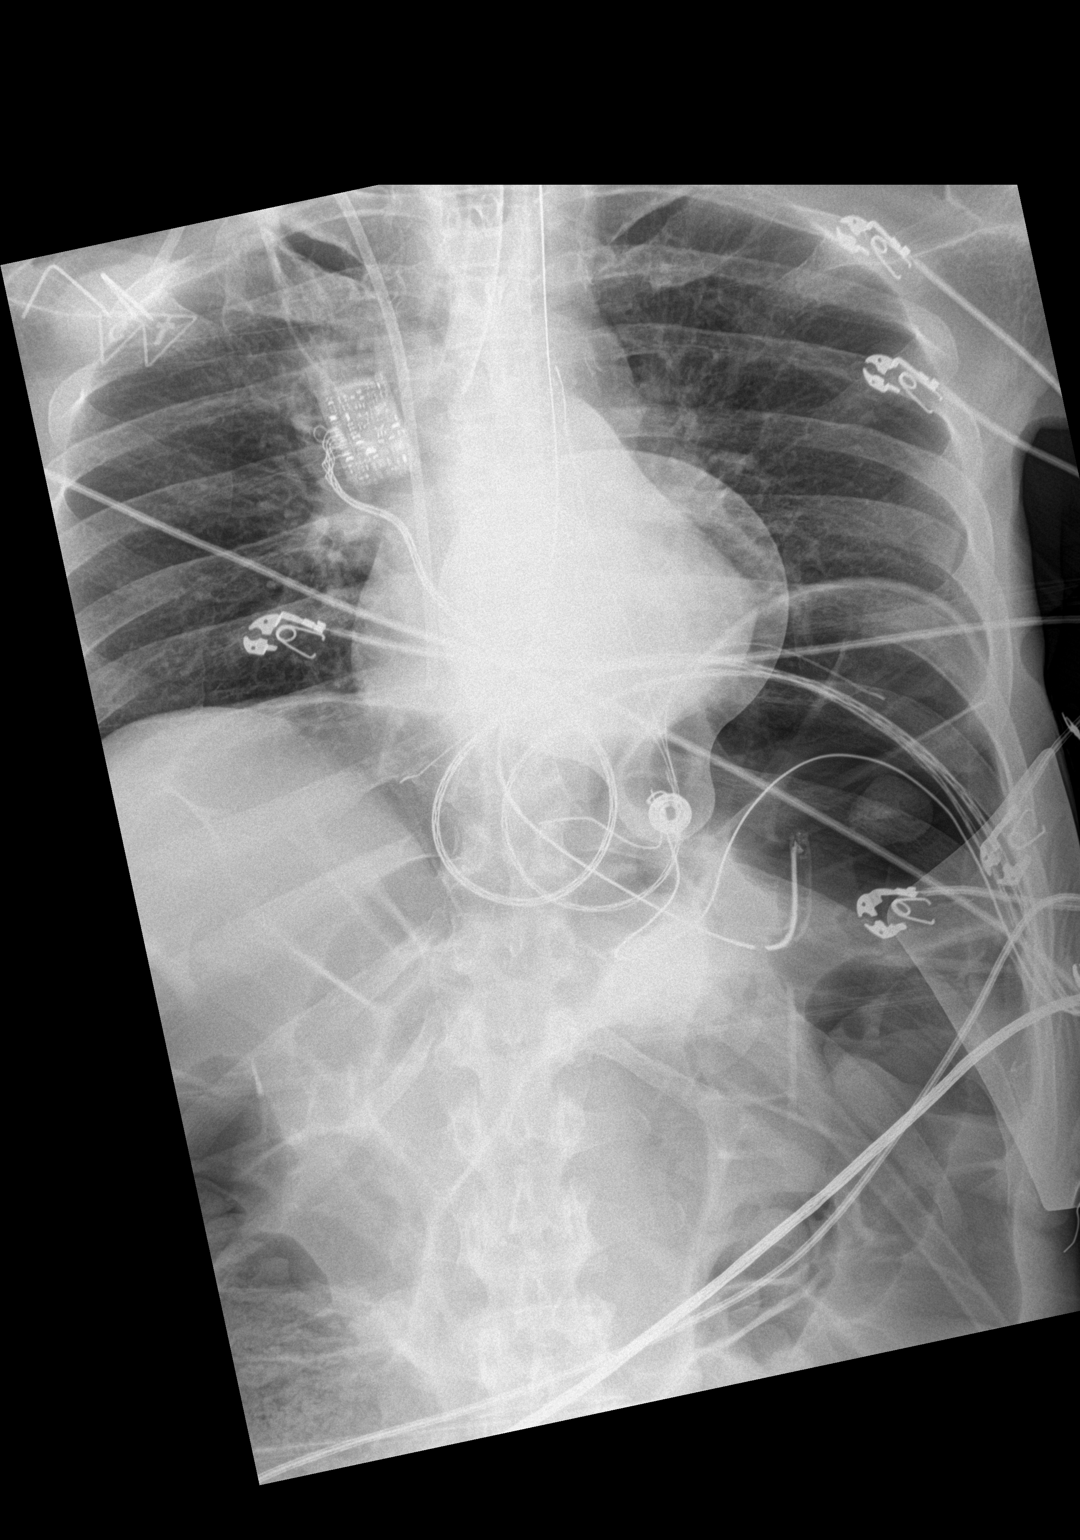

[2 of 2 positions shown; findings below may reference images not displayed]

FINDINGS: New endotracheal tube with tip 17 mm above the carina. Right porta
catheter with tip at the right atrium. Artifact from EKG leads and
defibrillator pads. Stable low volume chest with gas dilated colon
in the upper abdomen.
IMPRESSION: 1. New endotracheal tube with tip 17 mm above the carina.
2. Stable low volume but clear lungs.

## 2024-03-19 IMAGING — CT CT ABD-PELV W/ CM
2 of 4 series · 14 of 46 positions shown, 16 images · IV contrast (agent unspecified)
Comparison: None Available.
COMPARISON: None Available.

Addendum:
CLINICAL DATA: Hypoxia tonight. Less responsive. Hypotension and
tachycardia. History of ALS



[Series 3: a/p w/ 5mm · axial · 0.76mm/px · z∈[+816,+1196]mm · 11 of 92 slices shown, 13 images]
[im 8/92  soft-tissue]
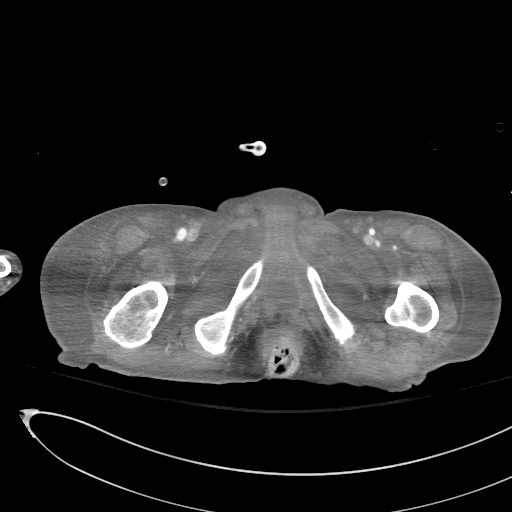
[im 8/92  bone]
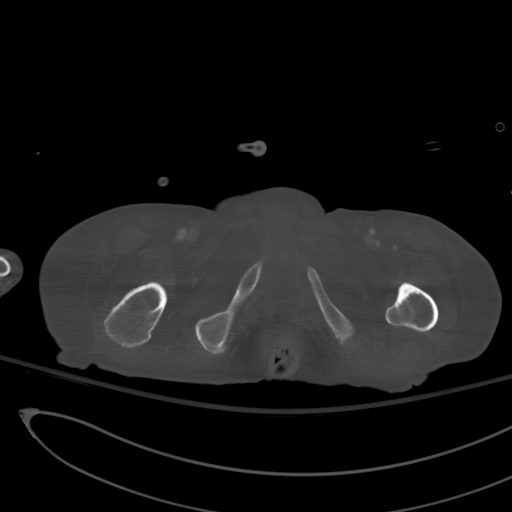
[im 15/92  soft-tissue]
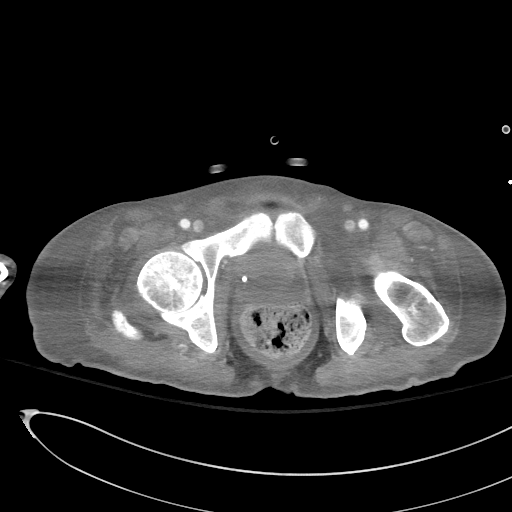
[im 22/92  soft-tissue]
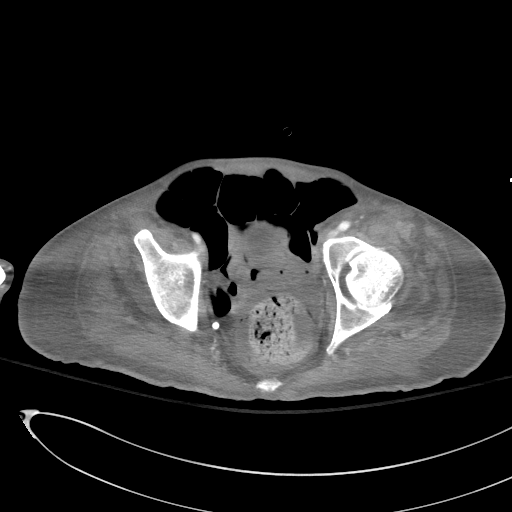
[im 30/92  soft-tissue]
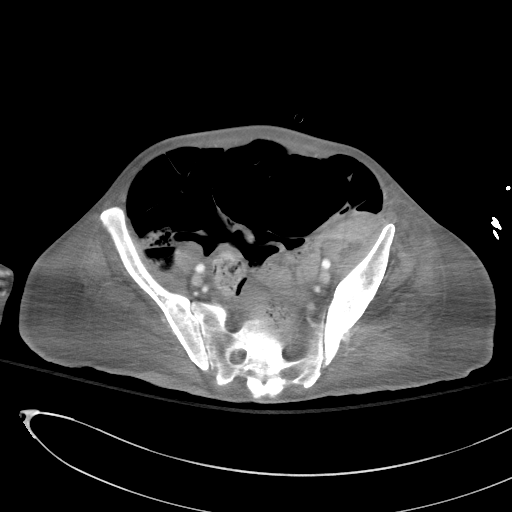
[im 37/92  soft-tissue]
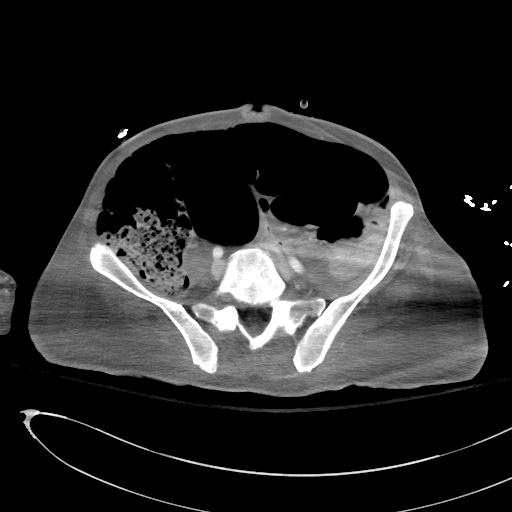
[im 48/92  soft-tissue]
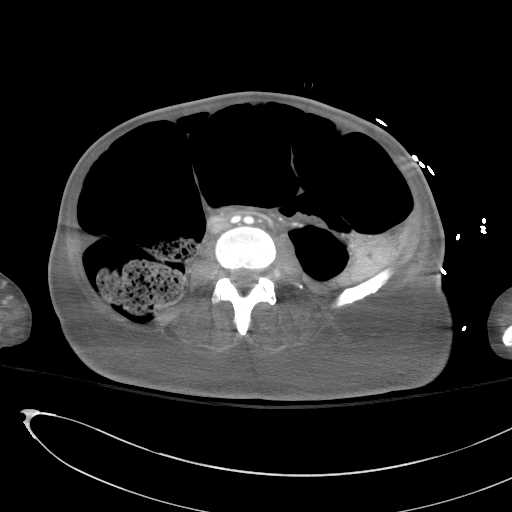
[im 55/92  soft-tissue]
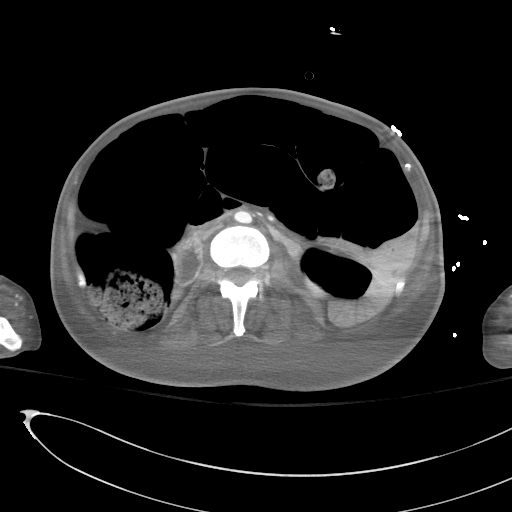
[im 62/92  soft-tissue]
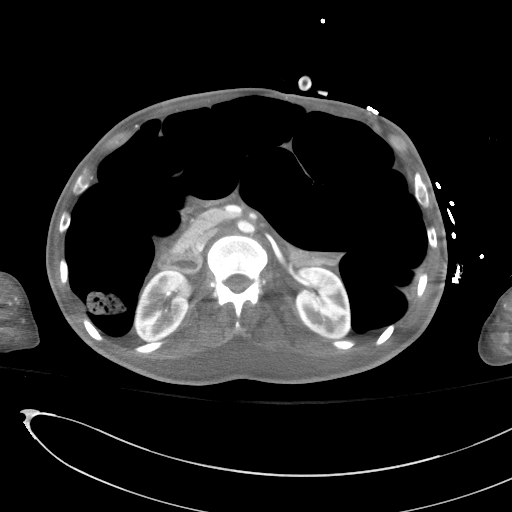
[im 70/92  soft-tissue]
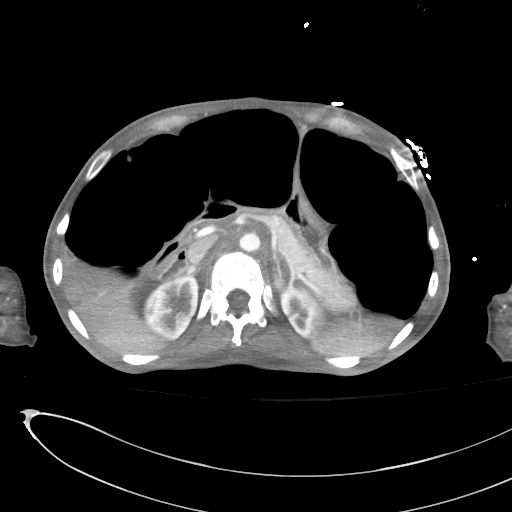
[im 70/92  bone]
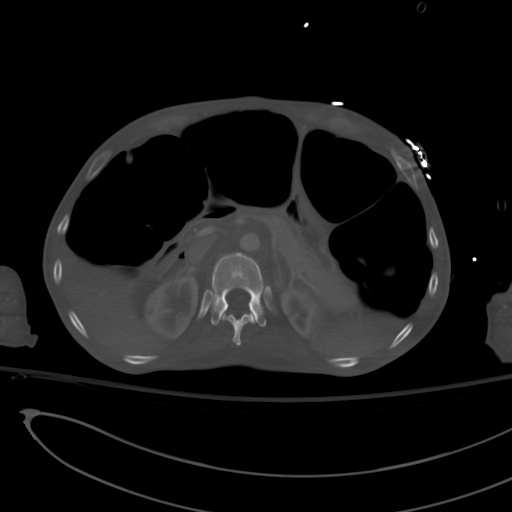
[im 77/92  soft-tissue]
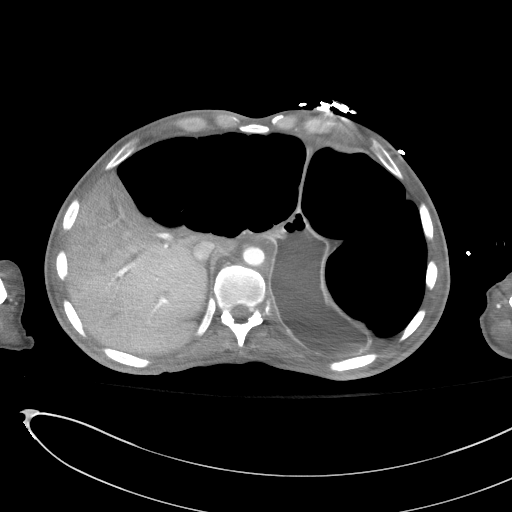
[im 84/92  soft-tissue]
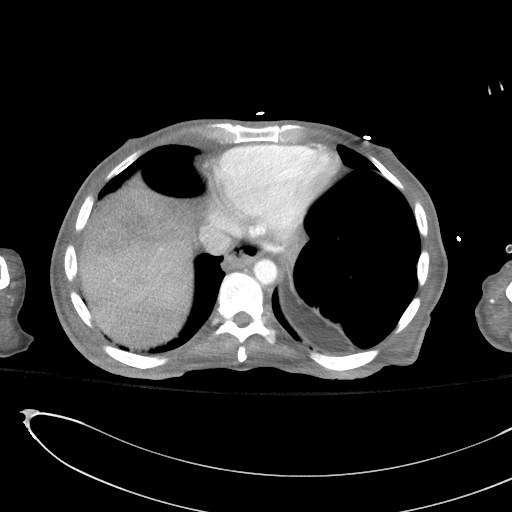

[Series 6: a/p w/ cor · coronal · 0.80mm/px · 3 of 120 slices shown]
[im 40/120  soft-tissue]
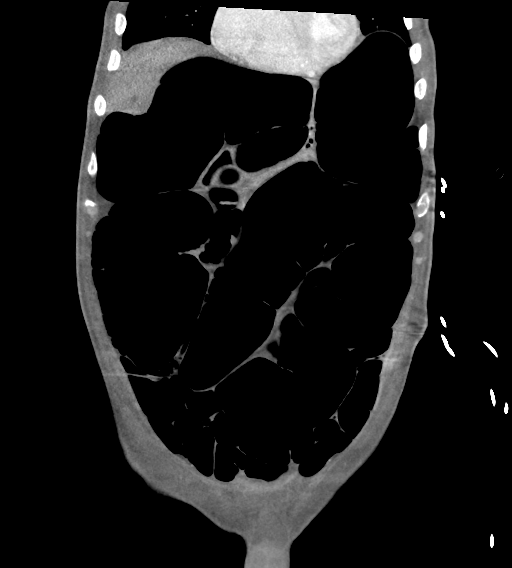
[im 53/120  soft-tissue]
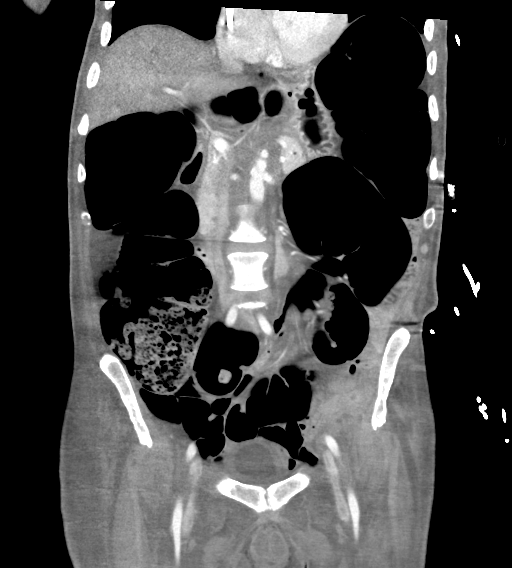
[im 67/120  soft-tissue]
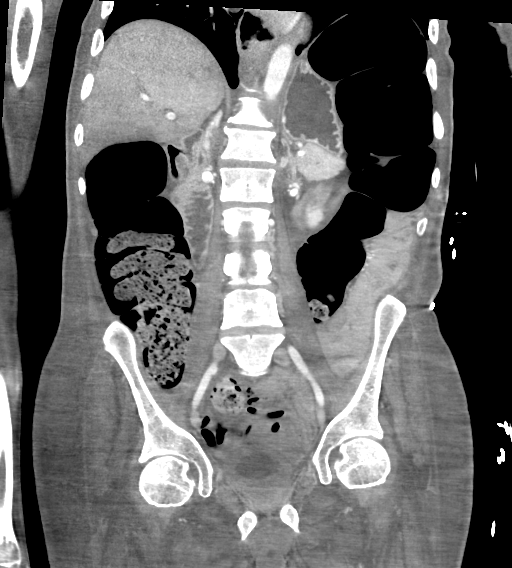

[14 of 46 positions shown; findings below may reference images not displayed]

RADIATION DOSE REDUCTION: This exam was performed according to the
departmental dose-optimization program which includes automated
exposure control, adjustment of the mA and/or kV according to
patient size and/or use of iterative reconstruction technique.

CONTRAST:  80mL OMNIPAQUE IOHEXOL 350 MG/ML SOLN
FINDINGS: CTA CHEST FINDINGS

Cardiovascular: Satisfactory opacification of the pulmonary arteries
to the segmental level. No evidence of pulmonary embolism. Normal
heart size, although there may be left ventricular hypertrophy. No
pericardial effusion.

Mediastinum/Nodes: Negative for adenopathy or mass

Lungs/Pleura: There is no edema, consolidation, effusion, or
pneumothorax.

Musculoskeletal: Cachexia appearance.

Review of the MIP images confirms the above findings.

CT ABDOMEN and PELVIS FINDINGS

Hepatobiliary: Somewhat heterogeneous enhancement of the ventral
liver, likely perfusional.No evidence of biliary obstruction or
stone.

Pancreas: Unremarkable.

Spleen: Unremarkable.

Adrenals/Urinary Tract: Negative adrenals. No hydronephrosis or
stone. Unremarkable bladder.

Stomach/Bowel: Diffuse gas dilated colon without volvulus. Stool
mainly in the rectum measuring up to 5.5 cm in diameter. Proximal
colonic distension up to 9 cm diameter. No pneumatosis or
perforation is seen. No small bowel obstruction. Located
percutaneous gastrostomy tube.

Vascular/Lymphatic: No acute vascular abnormality. No mass or
adenopathy.

Reproductive:There appears to be a Foley catheter with balloon
inflated at the level of the posterior penile urethra where there is
bulbous low-density appearance.

Other: No ascites or pneumoperitoneum.

Musculoskeletal: Cachectic appearance

Review of the MIP images confirms the above findings.
IMPRESSION: Chest CTA:

No acute finding, including pulmonary embolism.

Abdominal CT:

1. Diffuse gas dilated colon measuring up to 9 cm in diameter,
consistent with adynamic ileus.
2. Rectal stool measuring up to 5.5 cm in diameter.
3. Suspect Foley catheter inflated in the penile urethra.

ADDENDUM:
Case discussed with Dr. Kandjemuni. A Foley catheter is not present and
tubular low-density is presumably urine distending the urethra.

*** End of Addendum ***
RADIATION DOSE REDUCTION: This exam was performed according to the
departmental dose-optimization program which includes automated
exposure control, adjustment of the mA and/or kV according to
patient size and/or use of iterative reconstruction technique.

CONTRAST:  80mL OMNIPAQUE IOHEXOL 350 MG/ML SOLN
FINDINGS: CTA CHEST FINDINGS

Cardiovascular: Satisfactory opacification of the pulmonary arteries
to the segmental level. No evidence of pulmonary embolism. Normal
heart size, although there may be left ventricular hypertrophy. No
pericardial effusion.

Mediastinum/Nodes: Negative for adenopathy or mass

Lungs/Pleura: There is no edema, consolidation, effusion, or
pneumothorax.

Musculoskeletal: Cachexia appearance.

Review of the MIP images confirms the above findings.

CT ABDOMEN and PELVIS FINDINGS

Hepatobiliary: Somewhat heterogeneous enhancement of the ventral
liver, likely perfusional.No evidence of biliary obstruction or
stone.

Pancreas: Unremarkable.

Spleen: Unremarkable.

Adrenals/Urinary Tract: Negative adrenals. No hydronephrosis or
stone. Unremarkable bladder.

Stomach/Bowel: Diffuse gas dilated colon without volvulus. Stool
mainly in the rectum measuring up to 5.5 cm in diameter. Proximal
colonic distension up to 9 cm diameter. No pneumatosis or
perforation is seen. No small bowel obstruction. Located
percutaneous gastrostomy tube.

Vascular/Lymphatic: No acute vascular abnormality. No mass or
adenopathy.

Reproductive:There appears to be a Foley catheter with balloon
inflated at the level of the posterior penile urethra where there is
bulbous low-density appearance.

Other: No ascites or pneumoperitoneum.

Musculoskeletal: Cachectic appearance

Review of the MIP images confirms the above findings.
IMPRESSION: Chest CTA:

No acute finding, including pulmonary embolism.

Abdominal CT:

1. Diffuse gas dilated colon measuring up to 9 cm in diameter,
consistent with adynamic ileus.
2. Rectal stool measuring up to 5.5 cm in diameter.
3. Suspect Foley catheter inflated in the penile urethra.

## 2024-03-24 LAB — LAB REPORT - SCANNED
A1c: 5.2
EGFR: 151

## 2024-03-24 IMAGING — DX DG CHEST 1V PORT
1 series · 1 of 1 positions shown · non-contrast
Comparison: December 09, 2021

CLINICAL DATA: [id number], status post tracheostomy

EXAM:
PORTABLE CHEST 1 VIEW

[chest]
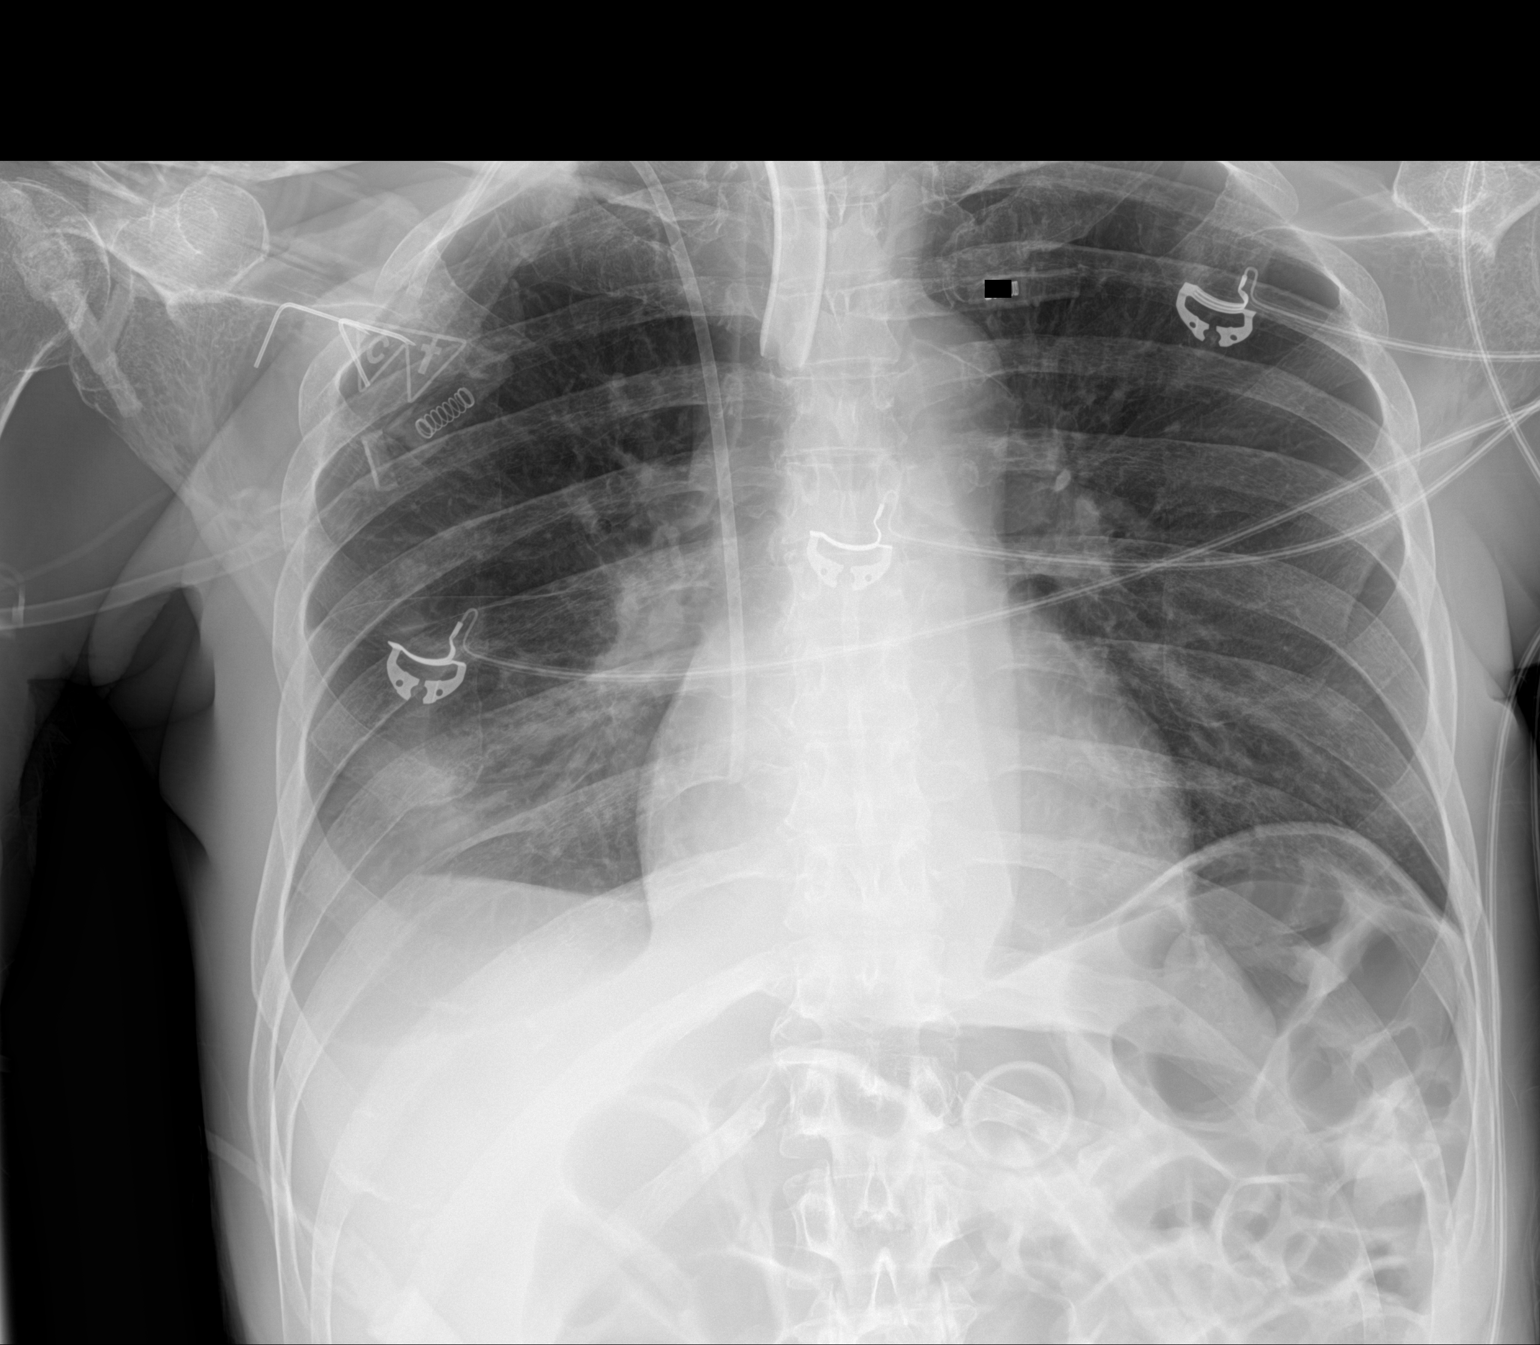

[1 of 1 positions shown; findings below may reference images not displayed]

FINDINGS: Again seen is the right transjugular double-lumen Port-A-Cath with
its tip at the caval atrial junction. There has been interval
tracheostomy tube insertion and is in satisfactory position. Heart
and mediastinum have a normal appearance. No focal consolidation,
pneumothorax or vascular congestion. Right lateral hemidiaphragm is
obliterated, which may be due to small pleural effusion.
IMPRESSION: There has been interval tracheostomy tube insertion and the tube is
in satisfactory position. Right lateral diaphragm is obliterated
which may be due to small pleural effusion.

## 2024-03-26 ENCOUNTER — Encounter: Payer: Self-pay | Admitting: Family Medicine

## 2024-03-26 IMAGING — DX DG CHEST 1V PORT
1 series · 1 of 1 positions shown · non-contrast
Comparison: Chest x-ray dated December 14, 2021

CLINICAL DATA: Respiratory failure

EXAM:
PORTABLE CHEST 1 VIEW

[chest ap]
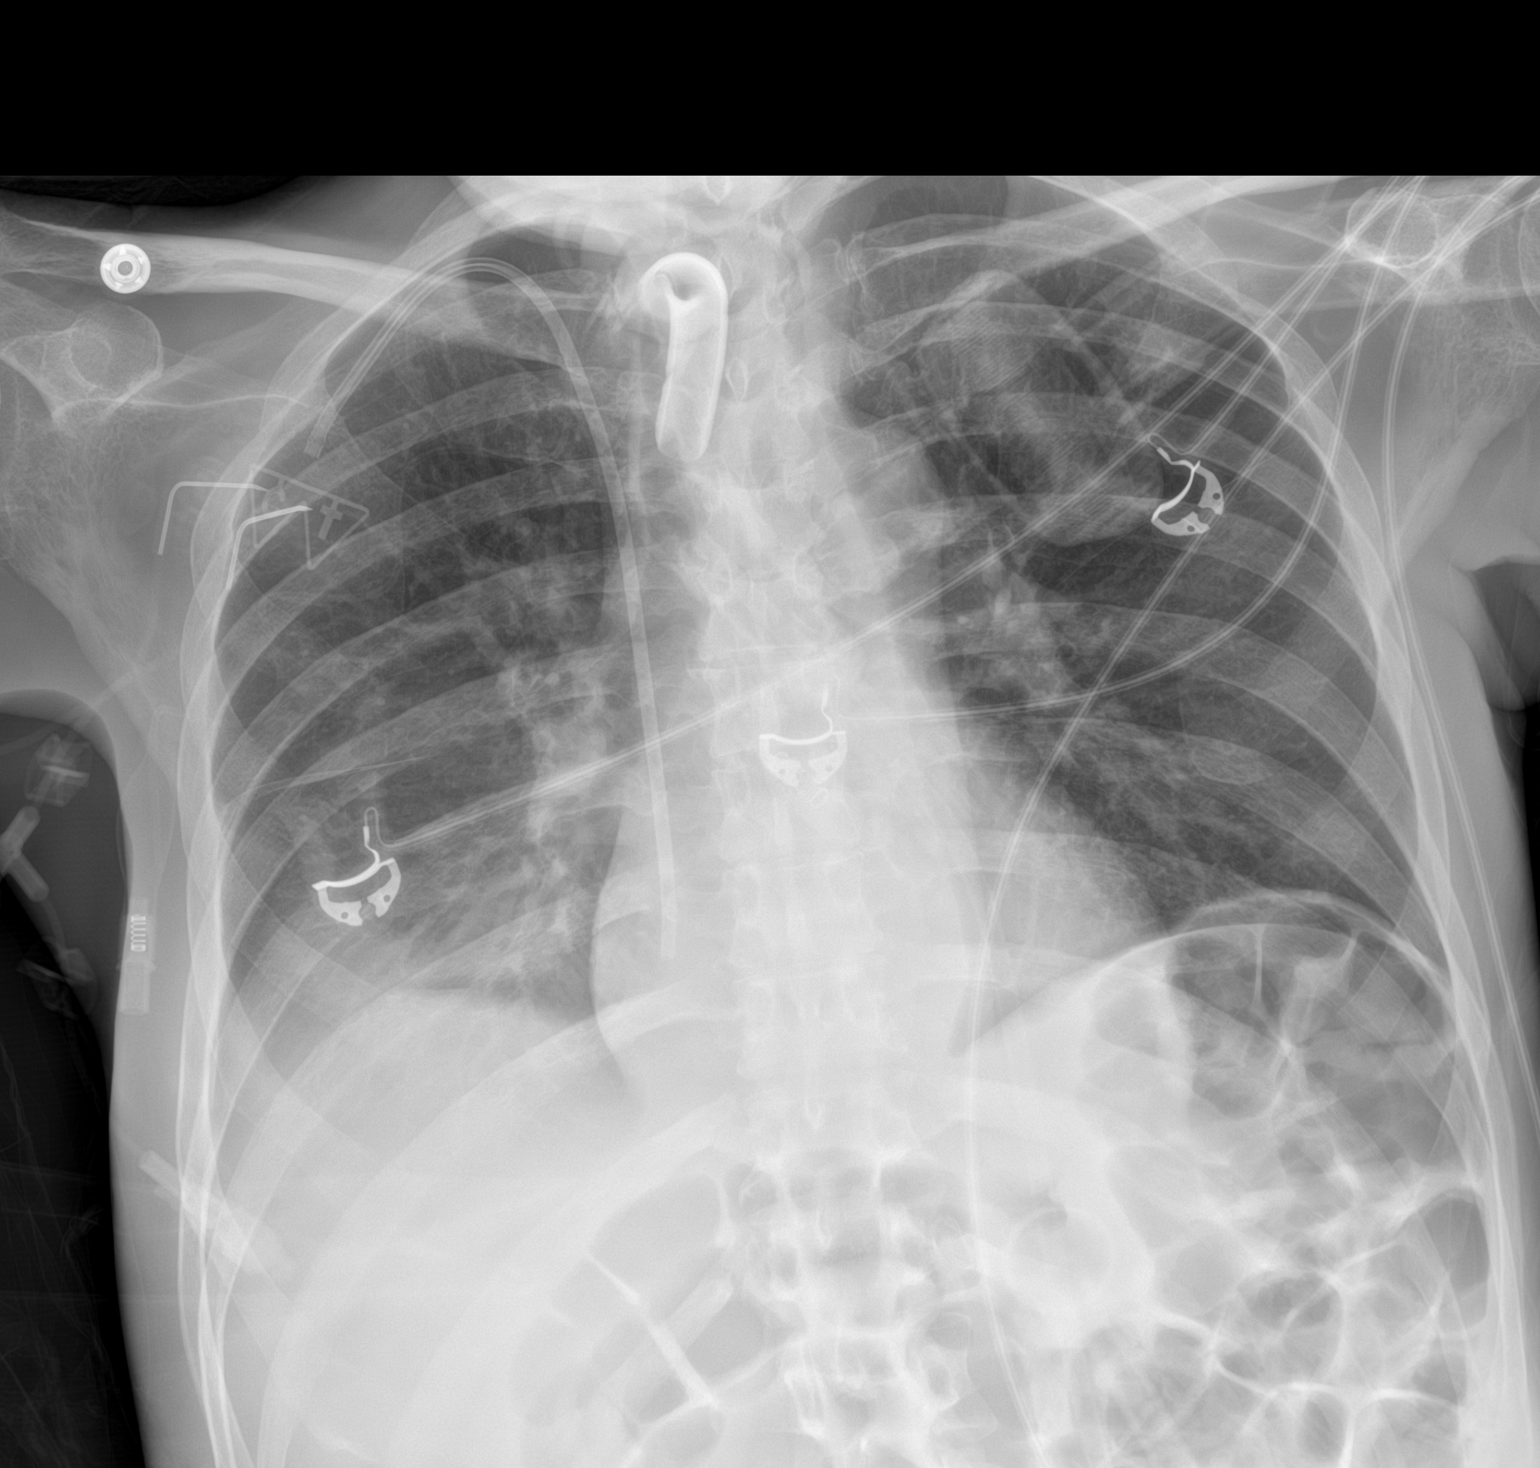

[1 of 1 positions shown; findings below may reference images not displayed]

FINDINGS: Cardiac and mediastinal contours are unchanged. Tracheostomy tube
and right chest wall port are unchanged in position. Unchanged
opacity of the lower right lung, possibly due to atelectasis or
small layering pleural effusion. No evidence of pneumothorax.
IMPRESSION: Unchanged opacity of the lower right lung, possibly due to
atelectasis or small layering pleural effusion.

## 2024-04-05 ENCOUNTER — Encounter (HOSPITAL_COMMUNITY): Payer: Self-pay

## 2024-04-05 ENCOUNTER — Other Ambulatory Visit: Payer: Self-pay

## 2024-04-05 ENCOUNTER — Observation Stay (HOSPITAL_COMMUNITY)
Admission: EM | Admit: 2024-04-05 | Discharge: 2024-04-08 | Disposition: A | Attending: Emergency Medicine | Admitting: Emergency Medicine

## 2024-04-05 DIAGNOSIS — Z9911 Dependence on respirator [ventilator] status: Secondary | ICD-10-CM | POA: Diagnosis not present

## 2024-04-05 DIAGNOSIS — G8250 Quadriplegia, unspecified: Secondary | ICD-10-CM | POA: Diagnosis present

## 2024-04-05 DIAGNOSIS — R491 Aphonia: Secondary | ICD-10-CM | POA: Insufficient documentation

## 2024-04-05 DIAGNOSIS — K810 Acute cholecystitis: Secondary | ICD-10-CM | POA: Diagnosis not present

## 2024-04-05 DIAGNOSIS — E876 Hypokalemia: Secondary | ICD-10-CM | POA: Diagnosis not present

## 2024-04-05 DIAGNOSIS — G1221 Amyotrophic lateral sclerosis: Secondary | ICD-10-CM | POA: Diagnosis present

## 2024-04-05 DIAGNOSIS — D649 Anemia, unspecified: Secondary | ICD-10-CM | POA: Diagnosis not present

## 2024-04-05 DIAGNOSIS — K819 Cholecystitis, unspecified: Secondary | ICD-10-CM | POA: Diagnosis present

## 2024-04-05 DIAGNOSIS — T85598A Other mechanical complication of other gastrointestinal prosthetic devices, implants and grafts, initial encounter: Principal | ICD-10-CM

## 2024-04-05 DIAGNOSIS — Z930 Tracheostomy status: Secondary | ICD-10-CM

## 2024-04-05 DIAGNOSIS — J9611 Chronic respiratory failure with hypoxia: Secondary | ICD-10-CM | POA: Diagnosis not present

## 2024-04-05 DIAGNOSIS — K9413 Enterostomy malfunction: Secondary | ICD-10-CM | POA: Diagnosis present

## 2024-04-05 DIAGNOSIS — K9423 Gastrostomy malfunction: Secondary | ICD-10-CM | POA: Diagnosis present

## 2024-04-05 MED ORDER — SODIUM BICARBONATE 650 MG PO TABS: 650.0000 mg | ORAL_TABLET | Freq: Once | ORAL | Status: DC

## 2024-04-05 MED ORDER — PANCRELIPASE (LIP-PROT-AMYL) 10440-39150 UNITS PO TABS
20880.0000 [IU] | ORAL_TABLET | Freq: Once | ORAL | Status: DC
Start: 2024-04-05 — End: 2024-04-06
  Filled 2024-04-05: qty 2

## 2024-04-05 NOTE — ED Triage Notes (Signed)
 Pt BIBA from home w/ c/o possible dislodged J tube. Pt is nonverbal at baseline, has chronic trach on vent. Per home care nurse, J tube has been unable to be flushed and appears to be moving. Per home nurse, pt abdomen is distended at baseline. Recent admission for same. Denies n/v/d, LMB last night.

## 2024-04-05 NOTE — ED Notes (Addendum)
 Attempt to flush G/J tube with sterile water . G port flushed/ pulls back as expected. Unable to flush or draw back any amount of liquid in J port. Large amount of sediment noted in J port. Provider made aware. Unable to administer any amount of medication at this time.

## 2024-04-05 NOTE — ED Provider Notes (Signed)
 [place] EMERGENCY DEPARTMENT AT [facility] Provider Note   CSN: [id number] Arrival date & time: 04/05/24  1924     Patient presents with: G tube issue   Jerry [name] is a 53 y.o. male.    Patient with h/o advanced ALS s/p trach with vent dependence, feeding tube Gtube to GJ tube conversion 03/01/24, recent admission for cholecystitis with placement of cholecystostomy tube--presents with family and caregiver.  They report that there was abnormal movement inside of the J-tube, difficulty removing tube feed from this port today due to the abnormal movement, and now inability to flush this port.  G-tube appears to be working normally.  Caregiver mentions that family was requesting a different type of feeding tube.  Patient with biliary drain currently in, no increase in drainage per caregiver.       Prior to Admission medications   Medication Sig Start Date End Date Taking? Authorizing Provider  cetirizine  HCl (ZYRTEC ) 5 MG/5ML SOLN Take 10 mLs (10 mg total) by mouth at bedtime. 07/27/23   Sowles, Krichna, MD  glycopyrrolate  (ROBINUL ) 1 MG tablet Place 1 tablet (1 mg total) into feeding tube 2 (two) times daily as needed. Patient taking differently: Place 1 mg into feeding tube 2 (two) times daily. 07/27/23   Sowles, Krichna, MD  ipratropium-albuterol  (DUONEB) 0.5-2.5 (3) MG/3ML SOLN USE 3 ML VIA NEBULIZER EVERY 4 HOURS AS NEEDED Patient taking differently: Inhale 3 mLs into the lungs every 4 (four) hours as needed. 11/04/23   Sowles, Krichna, MD  metoCLOPramide  (REGLAN ) 10 MG tablet 1 tablet (10 mg total) by Per J Tube route in the morning, at noon, and at bedtime. 03/13/24 04/12/24  Gonfa, Taye T, MD  Nutritional Supplements (FEEDING SUPPLEMENT, OSMOLITE 1.2 CAL,) LIQD Place 50 mL/hr into feeding tube continuous. 03/12/24 04/11/24  Gonfa, Taye T, MD  polyethylene glycol powder (GLYCOLAX /MIRALAX ) 17 GM/SCOOP powder Place 17 g into feeding tube daily. Titrate as needed for constipation  07/27/23   Sowles, Krichna, MD  Polyvinyl Alcohol -Povidone (CLEAR EYES NATURAL TEARS) 5-6 MG/ML SOLN Apply 1 drop to eye 3 (three) times daily as needed. 07/27/23   Sowles, Krichna, MD  Potassium Chloride  40 MEQ/15ML (20%) SOLN TAKE 15 ML BY MOUTH TWICE DAILY 08/26/23   Sowles, Krichna, MD  Protein (FEEDING SUPPLEMENT, PROSOURCE TF20,) liquid Place 60 mLs into feeding tube daily. 03/13/24   Gonfa, Taye T, MD  pyridostigmine  (MESTINON ) 60 MG/5ML solution Place 2.5 mLs (30 mg total) into feeding tube 2 (two) times daily. 03/12/24   Gonfa, Taye T, MD  scopolamine  (TRANSDERM-SCOP) 1 MG/3DAYS PLACE 1 PATCH ONTO THE SKIN EVERY 3 DAYS 08/18/23   Sowles, Krichna, MD  Simethicone  40 MG/0.6ML LIQD 1.2 mLs (80 mg total) by Per J Tube route in the morning, at noon, in the evening, and at bedtime. 03/12/24   Gonfa, Taye T, MD  sodium chloride  flush (NS) 0.9 % SOLN 10-40 mLs by Intracatheter route every 12 (twelve) hours. 03/12/24   Gonfa, Taye T, MD  triamcinolone  cream (KENALOG ) 0.1 % Apply 1 Application topically 2 (two) times daily. Patient not taking: Reported on 02/26/2024 07/27/23   Sowles, Krichna, MD  Water  For Irrigation, Sterile (FREE WATER ) SOLN Place 125 mLs into feeding tube every 6 (six) hours. 03/12/24   Gonfa, Taye T, MD    Allergies: Georgianne cerise allergy]    Review of Systems  Updated Vital Signs BP 130/83   Pulse 84   Temp 98.8 F (37.1 C) (Oral)   Resp 16  Ht 5' 5 (1.651 m)   Wt 74.4 kg   SpO2 100%   BMI 27.29 kg/m   Physical Exam Vitals and nursing note reviewed.  Constitutional:      Appearance: He is well-developed.     Comments: Using eye gaze alternative communication device  HENT:     Head: Normocephalic and atraumatic.     Nose: Nose normal.     Mouth/Throat:     Mouth: Mucous membranes are moist.  Eyes:     Conjunctiva/sclera: Conjunctivae normal.  Pulmonary:     Effort: No respiratory distress.  Abdominal:     Comments: GJ tube in place.  Biliary drain in place right  abdomen.  Musculoskeletal:     Cervical back: Normal range of motion and neck supple.  Skin:    General: Skin is warm and dry.  Neurological:     Mental Status: He is alert.     (all labs ordered are listed, but only abnormal results are displayed) Labs Reviewed  CBC WITH DIFFERENTIAL/PLATELET  BASIC METABOLIC PANEL WITH GFR    EKG: None  Radiology: No results found.   Procedures   Medications Ordered in the ED - No data to display  ED Course  Patient seen and examined. History obtained directly from notes, caregiver, patient.  Labs/EKG: None ordered  Imaging: None ordered  Medications/Fluids: None ordered  Most recent vital signs reviewed and are as follows: BP 130/83   Pulse 84   Temp 98.8 F (37.1 C) (Oral)   Resp 16   Ht 5' 5 (1.651 m)   Wt 74.4 kg   SpO2 100%   BMI 27.29 kg/m   Initial impression: Possible GJ tube dysfunction.  10:12 PM Discussed case with Dr. Pamella. Asked RN to attempt to flush feeding tube with water  to check function.   10:34 PM Dr. Pamella discussed with surgery. Protocol order to attempt declogging ordered.   10:43 PM RN is unable to withdrawal or get any liquid into the J-tube because it is clogged up to the tip.  Labs ordered. Will plan on admit for IR eval and management in the AM.   11:16 PM Smoot PA-C aware of patient at shift change. Awaiting IV team, labs. After that will request admit.                                    Medical Decision Making  Feeding tube clogged, unable to perform protocol here. Will request IR eval in AM. Labs pending.      Final diagnoses:  Feeding tube dysfunction, initial encounter    ED Discharge Orders     None          Desiderio Chew, PA-C 04/05/24 2317    Pamella Ozell LABOR, DO 04/12/24 1727

## 2024-04-06 ENCOUNTER — Observation Stay (HOSPITAL_COMMUNITY)

## 2024-04-06 ENCOUNTER — Encounter (HOSPITAL_COMMUNITY): Payer: Self-pay | Admitting: Family Medicine

## 2024-04-06 DIAGNOSIS — E876 Hypokalemia: Secondary | ICD-10-CM

## 2024-04-06 DIAGNOSIS — J9611 Chronic respiratory failure with hypoxia: Secondary | ICD-10-CM

## 2024-04-06 DIAGNOSIS — G1221 Amyotrophic lateral sclerosis: Secondary | ICD-10-CM

## 2024-04-06 DIAGNOSIS — K810 Acute cholecystitis: Secondary | ICD-10-CM

## 2024-04-06 DIAGNOSIS — K9413 Enterostomy malfunction: Secondary | ICD-10-CM | POA: Diagnosis not present

## 2024-04-06 DIAGNOSIS — Z9911 Dependence on respirator [ventilator] status: Secondary | ICD-10-CM

## 2024-04-06 DIAGNOSIS — G8250 Quadriplegia, unspecified: Secondary | ICD-10-CM | POA: Diagnosis not present

## 2024-04-06 DIAGNOSIS — Z930 Tracheostomy status: Secondary | ICD-10-CM | POA: Diagnosis not present

## 2024-04-06 DIAGNOSIS — D649 Anemia, unspecified: Secondary | ICD-10-CM | POA: Diagnosis not present

## 2024-04-06 HISTORY — PX: IR GJ TUBE CHANGE: IMG1440

## 2024-04-06 LAB — CBC WITH DIFFERENTIAL/PLATELET
Abs Immature Granulocytes: 0.05 K/uL (ref 0.00–0.07)
Basophils Absolute: 0 K/uL (ref 0.0–0.1)
Basophils Relative: 0 %
Eosinophils Absolute: 0.2 K/uL (ref 0.0–0.5)
Eosinophils Relative: 2 %
HCT: 29.1 % — ABNORMAL LOW (ref 39.0–52.0)
Hemoglobin: 8.9 g/dL — ABNORMAL LOW (ref 13.0–17.0)
Immature Granulocytes: 1 %
Lymphocytes Relative: 18 %
Lymphs Abs: 1.8 K/uL (ref 0.7–4.0)
MCH: 25.9 pg — ABNORMAL LOW (ref 26.0–34.0)
MCHC: 30.6 g/dL (ref 30.0–36.0)
MCV: 84.8 fL (ref 80.0–100.0)
Monocytes Absolute: 0.6 K/uL (ref 0.1–1.0)
Monocytes Relative: 6 %
Neutro Abs: 7.3 K/uL (ref 1.7–7.7)
Neutrophils Relative %: 73 %
Platelets: 289 K/uL (ref 150–400)
RBC: 3.43 MIL/uL — ABNORMAL LOW (ref 4.22–5.81)
RDW: 16.2 % — ABNORMAL HIGH (ref 11.5–15.5)
WBC: 10 K/uL (ref 4.0–10.5)
nRBC: 0 % (ref 0.0–0.2)

## 2024-04-06 LAB — BASIC METABOLIC PANEL WITH GFR
Anion gap: 13 (ref 5–15)
BUN: 5 mg/dL — ABNORMAL LOW (ref 6–20)
CO2: 17 mmol/L — ABNORMAL LOW (ref 22–32)
Calcium: 9.3 mg/dL (ref 8.9–10.3)
Chloride: 106 mmol/L (ref 98–111)
Creatinine, Ser: <0.3 mg/dL — ABNORMAL LOW (ref 0.61–1.24)
Glucose, Bld: 84 mg/dL (ref 70–99)
Potassium: 3.4 mmol/L — ABNORMAL LOW (ref 3.5–5.1)
Sodium: 136 mmol/L (ref 135–145)

## 2024-04-06 LAB — GLUCOSE, CAPILLARY: Glucose-Capillary: 71 mg/dL (ref 70–99)

## 2024-04-06 MED ORDER — POTASSIUM CHLORIDE 10 MEQ/100ML IV SOLN
10.0000 meq | INTRAVENOUS | Status: AC
  Administered 2024-04-06 (×2): 10 meq via INTRAVENOUS
  Filled 2024-04-06 (×2): qty 100

## 2024-04-06 MED ORDER — CHLORHEXIDINE GLUCONATE CLOTH 2 % EX PADS
6.0000 | MEDICATED_PAD | Freq: Every day | CUTANEOUS | Status: DC
  Administered 2024-04-06 – 2024-04-08 (×3): 6 via TOPICAL

## 2024-04-06 MED ORDER — LIDOCAINE VISCOUS HCL 2 % MT SOLN
OROMUCOSAL | Status: AC
  Filled 2024-04-06: qty 15

## 2024-04-06 MED ORDER — SODIUM CHLORIDE 0.9% FLUSH
3.0000 mL | Freq: Two times a day (BID) | INTRAVENOUS | Status: DC
  Administered 2024-04-06 – 2024-04-08 (×5): 3 mL via INTRAVENOUS

## 2024-04-06 MED ORDER — ACETAMINOPHEN 325 MG PO TABS: 650.0000 mg | ORAL_TABLET | Freq: Four times a day (QID) | ORAL | Status: DC | PRN

## 2024-04-06 MED ORDER — IPRATROPIUM-ALBUTEROL 0.5-2.5 (3) MG/3ML IN SOLN
3.0000 mL | RESPIRATORY_TRACT | Status: DC | PRN
  Administered 2024-04-06 – 2024-04-08 (×3): 3 mL via RESPIRATORY_TRACT
  Filled 2024-04-06 (×3): qty 3

## 2024-04-06 MED ORDER — FENTANYL CITRATE (PF) 100 MCG/2ML IJ SOLN
INTRAMUSCULAR | Status: AC
  Filled 2024-04-06: qty 2

## 2024-04-06 MED ORDER — ORAL CARE MOUTH RINSE: 15.0000 mL | OROMUCOSAL | Status: DC | PRN

## 2024-04-06 MED ORDER — PROCHLORPERAZINE EDISYLATE 10 MG/2ML IJ SOLN: 5.0000 mg | Freq: Four times a day (QID) | INTRAMUSCULAR | Status: DC | PRN

## 2024-04-06 MED ORDER — ACETAMINOPHEN 650 MG RE SUPP: 650.0000 mg | Freq: Four times a day (QID) | RECTAL | Status: DC | PRN

## 2024-04-06 MED ORDER — ORAL CARE MOUTH RINSE
15.0000 mL | OROMUCOSAL | Status: DC
Start: 2024-04-06 — End: 2024-04-08
  Administered 2024-04-06 – 2024-04-08 (×20): 15 mL via OROMUCOSAL

## 2024-04-06 MED ORDER — FENTANYL CITRATE PF 50 MCG/ML IJ SOSY
12.5000 ug | PREFILLED_SYRINGE | INTRAMUSCULAR | Status: DC | PRN
  Administered 2024-04-06: 50 ug via INTRAVENOUS
  Filled 2024-04-06: qty 1

## 2024-04-06 MED ORDER — LACTATED RINGERS IV SOLN: INTRAVENOUS | Status: AC

## 2024-04-06 MED ORDER — FENTANYL CITRATE (PF) 100 MCG/2ML IJ SOLN
INTRAMUSCULAR | Status: DC | PRN
Start: 2024-04-06 — End: 2024-04-08
  Administered 2024-04-06 (×2): 25 ug via INTRAVENOUS

## 2024-04-06 MED ORDER — SCOPOLAMINE 1 MG/3DAYS TD PT72
1.0000 | MEDICATED_PATCH | TRANSDERMAL | Status: DC
  Administered 2024-04-07: 1 mg via TRANSDERMAL
  Filled 2024-04-06: qty 1

## 2024-04-06 MED ORDER — SODIUM CHLORIDE 0.9 % IV BOLUS: 1000.0000 mL | Freq: Once | INTRAVENOUS | Status: DC

## 2024-04-06 MED ORDER — MIDAZOLAM HCL 2 MG/2ML IJ SOLN
INTRAMUSCULAR | Status: AC
  Filled 2024-04-06: qty 2

## 2024-04-06 MED ORDER — MIDAZOLAM HCL 2 MG/2ML IJ SOLN
INTRAMUSCULAR | Status: DC | PRN
Start: 2024-04-06 — End: 2024-04-08
  Administered 2024-04-06 (×2): 1 mg via INTRAVENOUS

## 2024-04-06 MED ORDER — IOHEXOL 300 MG/ML  SOLN
100.0000 mL | Freq: Once | INTRAMUSCULAR | Status: AC | PRN
  Administered 2024-04-06: 20 mL

## 2024-04-06 NOTE — Progress Notes (Signed)
 Ventilator patient transported from 3M13 to IR and back without any complications.

## 2024-04-06 NOTE — Progress Notes (Signed)
 PT was transported from 5w12 to 60m13 without complication.

## 2024-04-06 NOTE — ED Notes (Signed)
 Pt repositioned per request

## 2024-04-06 NOTE — Consult Note (Signed)
 NAME:  Jerry [name], MRN:  [id number], DOB:  09/26/1970, LOS: 0 ADMISSION DATE:  04/05/2024, CONSULTATION DATE:  04/06/24 REFERRING MD:  Vernon , CHIEF COMPLAINT:  chronic vent    History of Present Illness:  53 yo M PMH chronic resp failure / trach vent dependence, ALS who presented to ED 04/05/24 with G tube malfunction. This was not able to be declogged and it was deemed that he would require IR exchange. Admitted to [facility] in this setting.  10/3 PCCM is consulted for chronic vent   Pertinent  Medical History  ALS Trach/vent dependence   Significant Hospital Events: Including procedures, antibiotic start and stop dates in addition to other pertinent events   10/2 ED w G tube malfunction, unable to declog 10/3 moving to unit for chronic vent, pulm consult   Interim History / Subjective:  Admitted   Wants to go home   Objective    Blood pressure 124/83, pulse 89, temperature 98.7 F (37.1 C), temperature source Oral, resp. rate 16, height 5' 5 (1.651 m), weight 74.4 kg, SpO2 100%.    Vent Mode: AC FiO2 (%):  [28 %] 28 % Set Rate:  [16 bmp] 16 bmp Vt Set:  [500 mL] 500 mL PEEP:  [5 cmH20] 5 cmH20 Plateau Pressure:  [23 cmH20] 23 cmH20   Intake/Output Summary (Last 24 hours) at 04/06/2024 1346 Last data filed at 04/06/2024 9281 Gross per 24 hour  Intake 100 ml  Output --  Net 100 ml   Filed Weights   04/05/24 1938  Weight: 74.4 kg    Examination: General: very pleasant chronically ill middle aged M NAD HENT: trach secure anicteric sclera clear oral secretions  Lungs: some RUL rhonchi otherwise clear. Mechanically ventilated  Cardiovascular: cap refill brisk  Abdomen: thin Neuro: awake alert interactive    Resolved problem list   Assessment and Plan   Chronic respiratory failure w trach vent dependence 2/2 ALS P -he will be moved to an ICU because of his home vent needs, but is fine to remain under hospitalist service -continue home vent, home vent settings   -I have put in for VAP bundle and usual pulm hygiene orders -routine trach care -PCCM will follow weekly while inpt, please call us  if needed again sooner   G tube malfunction -for exchange w IR   Labs   CBC: Recent Labs  Lab 04/06/24 0124  WBC 10.0  NEUTROABS 7.3  HGB 8.9*  HCT 29.1*  MCV 84.8  PLT 289    Basic Metabolic Panel: Recent Labs  Lab 04/06/24 0124  NA 136  K 3.4*  CL 106  CO2 17*  GLUCOSE 84  BUN 5*  CREATININE <0.30*  CALCIUM  9.3   GFR: CrCl cannot be calculated (This lab value cannot be used to calculate CrCl because it is not a number: <0.30). Recent Labs  Lab 04/06/24 0124  WBC 10.0    Liver Function Tests: No results for input(s): AST, ALT, ALKPHOS, BILITOT, PROT, ALBUMIN in the last 168 hours. No results for input(s): LIPASE, AMYLASE in the last 168 hours. No results for input(s): AMMONIA in the last 168 hours.  ABG    Component Value Date/Time   PHART 7.52 (H) 01/09/2022 1810   PCO2ART 26 (L) 01/09/2022 1810   PO2ART 66 (L) 01/09/2022 1810   HCO3 19.2 (L) 02/26/2024 0737   TCO2 20 (L) 02/26/2024 0737   ACIDBASEDEF 3.0 (H) 02/26/2024 0737   O2SAT 94 02/26/2024 0737     Coagulation  Profile: No results for input(s): INR, PROTIME in the last 168 hours.  Cardiac Enzymes: No results for input(s): CKTOTAL, CKMB, CKMBINDEX, TROPONINI in the last 168 hours.  HbA1C: Hgb A1c MFr Bld  Date/Time Value Ref Range Status  02/26/2024 06:28 PM 5.2 4.8 - 5.6 % Final    Comment:    (NOTE) Diagnosis of Diabetes The following HbA1c ranges recommended by the American Diabetes Association (ADA) may be used as an aid in the diagnosis of diabetes mellitus.  Hemoglobin             Suggested A1C NGSP%              Diagnosis  <5.7                   Non Diabetic  5.7-6.4                Pre-Diabetic  >6.4                   Diabetic  <7.0                   Glycemic control for                       adults with  diabetes.    12/09/2021 11:05 AM 5.4 4.8 - 5.6 % Final    Comment:    (NOTE) Pre diabetes:          5.7%-6.4%  Diabetes:              >6.4%  Glycemic control for   <7.0% adults with diabetes     CBG: No results for input(s): GLUCAP in the last 168 hours.  Review of Systems:   Abd discomfort  Past Medical History:  He,  has a past medical history of ALS (amyotrophic lateral sclerosis) (HCC), COVID-19 virus infection (06/2020), Meningitis (2003), Morbid obesity (HCC), Neuromuscular disorder (HCC), Prediabetes (11/05/2016), and Type 2 diabetes mellitus (HCC) (08/31/2023).   Surgical History:   Past Surgical History:  Procedure Laterality Date   IR CHOLANGIOGRAM EXISTING TUBE  02/29/2024   IR GJ TUBE CHANGE  03/01/2024   IR PERC CHOLECYSTOSTOMY  02/26/2024   IR REPLACE G-TUBE SIMPLE WO FLUORO  06/22/2022   MASS EXCISION Right 11/26/2014   Procedure: EXCISION MASS/GROIN EXCISION MASS;  Surgeon: Reyes LELON Cota, MD;  Location: [facility] ORS;  Service: General;  Laterality: Right;   MASS EXCISION Right 11/26/2014   Procedure: MINOR EXCISION OF MASS/POST THIGH MASS;  Surgeon: Reyes LELON Cota, MD;  Location: [facility] ORS;  Service: General;  Laterality: Right;   PEG PLACEMENT  10/27/2021   PORTACATH PLACEMENT Right 06/19/2019     Social History:   reports that he has never smoked. He has never used smokeless tobacco. He reports that he does not drink alcohol  and does not use drugs.   Family History:  His family history includes Diabetes in his brother, father, mother, sister, sister, and sister; Fibromyalgia in his sister; Hypertension in his sister.   Allergies Allergies  Allergen Reactions   Crab [Shellfish Allergy] Rash    Confirmed no allergy to fish with wife 02/28/24     Home Medications  Prior to Admission medications   Medication Sig Start Date End Date Taking? Authorizing Provider  cetirizine  HCl (ZYRTEC ) 5 MG/5ML SOLN Take 10 mLs (10 mg total) by mouth at  bedtime. Patient taking differently: Place 10 mLs into feeding tube at bedtime. 07/27/23  Yes Sowles, Krichna, MD  glycopyrrolate  (ROBINUL ) 1 MG tablet Place 1 tablet (1 mg total) into feeding tube 2 (two) times daily as needed. Patient taking differently: Place 1 mg into feeding tube 2 (two) times daily. 07/27/23  Yes Sowles, Krichna, MD  ipratropium-albuterol  (DUONEB) 0.5-2.5 (3) MG/3ML SOLN USE 3 ML VIA NEBULIZER EVERY 4 HOURS AS NEEDED Patient taking differently: Inhale 3 mLs into the lungs every 4 (four) hours as needed (wheezing/sob). 11/04/23  Yes Sowles, Krichna, MD  metoCLOPramide  (REGLAN ) 10 MG tablet 1 tablet (10 mg total) by Per J Tube route in the morning, at noon, and at bedtime. Patient taking differently: Place 10 mg into feeding tube in the morning and at bedtime. 03/13/24 04/12/24 Yes Gonfa, Taye T, MD  Nutritional Supplements (FEEDING SUPPLEMENT, OSMOLITE 1.2 CAL,) LIQD Place 50 mL/hr into feeding tube continuous. Patient taking differently: Place 55 mL/hr into feeding tube continuous. 03/12/24 04/11/24 Yes Gonfa, Taye T, MD  polyethylene glycol powder (GLYCOLAX /MIRALAX ) 17 GM/SCOOP powder Place 17 g into feeding tube daily. Titrate as needed for constipation Patient taking differently: Place 17 g into feeding tube daily as needed for mild constipation or moderate constipation. 07/27/23  Yes Sowles, Krichna, MD  Polyvinyl Alcohol -Povidone (CLEAR EYES NATURAL TEARS) 5-6 MG/ML SOLN Apply 1 drop to eye 3 (three) times daily as needed. Patient taking differently: Place 1 drop into both eyes daily as needed (dry eye). 07/27/23  Yes Sowles, Krichna, MD  scopolamine  (TRANSDERM-SCOP) 1 MG/3DAYS PLACE 1 PATCH ONTO THE SKIN EVERY 3 DAYS 08/18/23  Yes Sowles, Krichna, MD  Simethicone  40 MG/0.6ML LIQD 1.2 mLs (80 mg total) by Per J Tube route in the morning, at noon, in the evening, and at bedtime. Patient taking differently: Place 80 mg into feeding tube at bedtime. 03/12/24  Yes Gonfa, Taye T, MD  sodium  chloride flush (NS) 0.9 % SOLN 10-40 mLs by Intracatheter route every 12 (twelve) hours. Patient taking differently: 10 mLs by Intracatheter route every 12 (twelve) hours. 03/12/24  Yes Gonfa, Taye T, MD  Water  For Irrigation, Sterile (FREE WATER ) SOLN Place 125 mLs into feeding tube every 6 (six) hours. Patient taking differently: Place 125 mLs into feeding tube every 2 (two) hours. 03/12/24  Yes Gonfa, Taye T, MD  Potassium Chloride  40 MEQ/15ML (20%) SOLN TAKE 15 ML BY MOUTH TWICE DAILY Patient not taking: Reported on 04/06/2024 08/26/23   Sowles, Krichna, MD  pyridostigmine  (MESTINON ) 60 MG/5ML solution Place 2.5 mLs (30 mg total) into feeding tube 2 (two) times daily. Patient not taking: Reported on 04/06/2024 03/12/24   Kathrin Mignon DASEN, MD     Critical care time: na        Low MDM   Ronnald Gave MSN, AGACNP-BC [facility] Pulmonary/Critical Care Medicine Amion for pager  04/06/2024, 2:23 PM

## 2024-04-06 NOTE — ED Notes (Signed)
 Patient cleaned and old bedding removed and clean linen applied new condom cath applied. Pillows placed for comfort and wife at bedside states patient is good.

## 2024-04-06 NOTE — ED Notes (Signed)
 Pt turned and repositioned for comfort using several pillows. Spouse at bedside, resting on stretcher. Pt communicates using ipad and eye brow signals (raised once for no, twice for yes). Pt suctioned frequently (trach and mouth). Pt w/o complaints at this time, remains on cardiac monitoring and personal ventilator.

## 2024-04-06 NOTE — ED Notes (Signed)
IR at bedside

## 2024-04-06 NOTE — Progress Notes (Signed)
 [facility] ED -[facility] Liaison Note  This patient is currently enrolled in [facility] outpatient-based palliative care and homebase primary care patient. Hospital Liaison will continue to follow for discharge disposition.   Please call for any outpatient based palliative care related questions or concerns.   Thank you,  Dick Hansen -BSN [facility] Liaison  [phone]

## 2024-04-06 NOTE — Consult Note (Addendum)
 Chief Complaint: Patient was seen in consultation today for occluded J-limb of G-tube, with consideration for exchange.  Referring Provider(s): Mr. Fonda Ruby, [place]   Supervising Physician: Philip Cornet  Patient Status: [facility] - ED  Patient is Full Code  History of Present Illness: Jerry [name] is a 53 y.o. male  with PMHx notable for advanced ALS with quadriplegia, dysphagia secondary to tracheostomy/ventilator dependent status s/p venting G-tube with J-limb, DMT2, and morbid obesity.  Per Mr. Kendall EDP note yesterday 10/2: Patient with h/o advanced ALS s/p trach with vent dependence, feeding tube Gtube to GJ tube conversion 03/01/24, recent admission for cholecystitis with placement of cholecystostomy tube--presents with family and caregiver.  They report that there was abnormal movement inside of the J-tube, difficulty removing tube feed from this port today due to the abnormal movement, and now inability to flush this port.  G-tube appears to be working normally.  Caregiver mentions that family was requesting a different type of feeding tube.   Patient with biliary drain currently in, no increase in drainage per caregiver.   Interventional Radiology was requested for gastrojejunostomy tube exchange. Request was reviewed and approved by Dr. Philip. Patient is scheduled for same in IR today.   Patient is alert and laying in bed, calm. He communicates using an electronic keyboard which he controls with his eyes. Wife is at bedside. Patient is currently without any significant complaints. It is difficult to assess a complete ROS.    Past Medical History:  Diagnosis Date   ALS (amyotrophic lateral sclerosis) (HCC)    COVID-19 virus infection 06/2020   Meningitis 2003   spinal   Morbid obesity (HCC)    Neuromuscular disorder (HCC)    Prediabetes 11/05/2016   A1C 5.7 on 11/05/16   Type 2 diabetes mellitus (HCC) 08/31/2023    Past Surgical History:  Procedure Laterality  Date   IR CHOLANGIOGRAM EXISTING TUBE  02/29/2024   IR GJ TUBE CHANGE  03/01/2024   IR PERC CHOLECYSTOSTOMY  02/26/2024   IR REPLACE G-TUBE SIMPLE WO FLUORO  06/22/2022   MASS EXCISION Right 11/26/2014   Procedure: EXCISION MASS/GROIN EXCISION MASS;  Surgeon: Reyes LELON Cota, MD;  Location: [facility] ORS;  Service: General;  Laterality: Right;   MASS EXCISION Right 11/26/2014   Procedure: MINOR EXCISION OF MASS/POST THIGH MASS;  Surgeon: Reyes LELON Cota, MD;  Location: [facility] ORS;  Service: General;  Laterality: Right;   PEG PLACEMENT  10/27/2021   PORTACATH PLACEMENT Right 06/19/2019    Allergies: Georgianne cerise allergy]  Medications: Prior to Admission medications   Medication Sig Start Date End Date Taking? Authorizing Provider  cetirizine  HCl (ZYRTEC ) 5 MG/5ML SOLN Take 10 mLs (10 mg total) by mouth at bedtime. 07/27/23   Sowles, Krichna, MD  glycopyrrolate  (ROBINUL ) 1 MG tablet Place 1 tablet (1 mg total) into feeding tube 2 (two) times daily as needed. Patient taking differently: Place 1 mg into feeding tube 2 (two) times daily. 07/27/23   Sowles, Krichna, MD  ipratropium-albuterol  (DUONEB) 0.5-2.5 (3) MG/3ML SOLN USE 3 ML VIA NEBULIZER EVERY 4 HOURS AS NEEDED Patient taking differently: Inhale 3 mLs into the lungs every 4 (four) hours as needed. 11/04/23   Sowles, Krichna, MD  metoCLOPramide  (REGLAN ) 10 MG tablet 1 tablet (10 mg total) by Per J Tube route in the morning, at noon, and at bedtime. 03/13/24 04/12/24  Gonfa, Taye T, MD  Nutritional Supplements (FEEDING SUPPLEMENT, OSMOLITE 1.2 CAL,) LIQD Place 50 mL/hr into feeding tube continuous.  03/12/24 04/11/24  Gonfa, Taye T, MD  polyethylene glycol powder (GLYCOLAX /MIRALAX ) 17 GM/SCOOP powder Place 17 g into feeding tube daily. Titrate as needed for constipation 07/27/23   Sowles, Krichna, MD  Polyvinyl Alcohol -Povidone (CLEAR EYES NATURAL TEARS) 5-6 MG/ML SOLN Apply 1 drop to eye 3 (three) times daily as needed. 07/27/23   Sowles, Krichna, MD   Potassium Chloride  40 MEQ/15ML (20%) SOLN TAKE 15 ML BY MOUTH TWICE DAILY 08/26/23   Sowles, Krichna, MD  Protein (FEEDING SUPPLEMENT, PROSOURCE TF20,) liquid Place 60 mLs into feeding tube daily. 03/13/24   Gonfa, Taye T, MD  pyridostigmine  (MESTINON ) 60 MG/5ML solution Place 2.5 mLs (30 mg total) into feeding tube 2 (two) times daily. 03/12/24   Gonfa, Taye T, MD  scopolamine  (TRANSDERM-SCOP) 1 MG/3DAYS PLACE 1 PATCH ONTO THE SKIN EVERY 3 DAYS 08/18/23   Sowles, Krichna, MD  Simethicone  40 MG/0.6ML LIQD 1.2 mLs (80 mg total) by Per J Tube route in the morning, at noon, in the evening, and at bedtime. 03/12/24   Gonfa, Taye T, MD  sodium chloride  flush (NS) 0.9 % SOLN 10-40 mLs by Intracatheter route every 12 (twelve) hours. 03/12/24   Gonfa, Taye T, MD  Water  For Irrigation, Sterile (FREE WATER ) SOLN Place 125 mLs into feeding tube every 6 (six) hours. 03/12/24   Kathrin Mignon DASEN, MD     Family History  Problem Relation Age of Onset   Diabetes Father    Diabetes Mother    Diabetes Sister    Hypertension Sister    Diabetes Brother    Diabetes Sister    Fibromyalgia Sister    Diabetes Sister     Social History   Socioeconomic History   Marital status: Married    Spouse name: [name]   Number of children: 3   Years of education: Not on file   Highest education level: High school graduate  Occupational History   Occupation: Biomedical scientist    Comment: self employed   Occupation: Consulting civil engineer    Comment: [employer]  Tobacco Use   Smoking status: Never   Smokeless tobacco: Never  Advertising account planner   Vaping status: Never Used  Substance and Sexual Activity   Alcohol  use: No    Alcohol /week: 0.0 standard drinks of alcohol    Drug use: No   Sexual activity: Yes    Partners: Female  Other Topics Concern   Not on file  Social History Narrative   Lives with wife and 3 children   Approved for disability 02/2019 for ALS    Social Drivers of Health   Financial Resource Strain: Low Risk  (09/29/2022)    Overall Financial Resource Strain (CARDIA)    Difficulty of Paying Living Expenses: Not very hard  Food Insecurity: No Food Insecurity (04/06/2024)   Hunger Vital Sign    Worried About Running Out of Food in the Last Year: Never true    Ran Out of Food in the Last Year: Never true  Transportation Needs: No Transportation Needs (04/06/2024)   PRAPARE - Administrator, Civil Service (Medical): No    Lack of Transportation (Non-Medical): No  Physical Activity: Inactive (10/11/2023)   Exercise Vital Sign    Days of Exercise per Week: 0 days    Minutes of Exercise per Session: 0 min  Stress: Stress Concern Present (10/11/2023)   [employer] of Occupational Health - Occupational Stress Questionnaire    Feeling of Stress : To some extent  Social Connections: Unknown (10/11/2023)  Social Advertising account executive    Frequency of Communication with Friends and Family: More than three times a week    Frequency of Social Gatherings with Friends and Family: More than three times a week    Attends Religious Services: Not on Marketing executive or Organizations: No    Attends Banker Meetings: Never    Marital Status: Married     Review of Systems: A 12 point ROS discussed and pertinent positives are indicated in the HPI above.  All other systems are negative.  Vital Signs: BP 119/78   Pulse 99   Temp 98.7 F (37.1 C) (Oral)   Resp 16   Ht 5' 5 (1.651 m)   Wt 164 lb (74.4 kg)   SpO2 100%   BMI 27.29 kg/m   Advance Care Plan: The advanced care place/surrogate decision maker was discussed at the time of visit and the patient did not wish to discuss or was not able to name a surrogate decision maker or provide an advance care plan.  Physical Exam Vitals reviewed.  Constitutional:      General: He is not in acute distress.    Appearance: Normal appearance.     Comments: Patient communicates using a keyboard controlled by his eyes/gaze. He is s/p  tracheostomy, vent dependent.  HENT:     Mouth/Throat:     Mouth: Mucous membranes are dry.  Cardiovascular:     Rate and Rhythm: Normal rate and regular rhythm.     Pulses: Normal pulses.     Heart sounds: Normal heart sounds.  Pulmonary:     Breath sounds: Rales present.     Comments: Patient is s/p tracheostomy, vent dependent. Respiratory team present at time of examination to manage patient's ventilator. Abdominal:     General: Abdomen is flat. There is no distension.     Palpations: Abdomen is soft.     Tenderness: There is no abdominal tenderness.     Comments: GJ tube in place, with a visibly occluded J-limb. Otherwise appropriately dressed. No obvious signs of leaking nor infection. Biliary drain in place, appropriately dressed.  Musculoskeletal:        General: Normal range of motion.     Cervical back: Normal range of motion.  Skin:    General: Skin is warm and dry.  Neurological:     Mental Status: He is alert and oriented to person, place, and time.  Psychiatric:        Mood and Affect: Mood normal.        Behavior: Behavior normal.        Thought Content: Thought content normal.        Judgment: Judgment normal.     Imaging: DG Abd 1 View Result Date: 04/06/2024 CLINICAL DATA:  Abdominal distention. EXAM: ABDOMEN - 1 VIEW COMPARISON:  03/08/2024 FINDINGS: The fairly marked gaseous distention of colon is similar to prior. Underlying gaseous distension of small bowel is also similar. Right-sided pigtail drainage catheter remains in place. A GJ tube is again noted with some redundant catheter in the stomach. The tip does appear to be transpyloric and is looped upon itself in the region of the descending duodenum, new in the interval. IMPRESSION: 1. No substantial change in the marked gaseous distention of colon with persistent minimal small bowel gas evident. 2. GJ tube tip appears to be transpyloric and is looped upon itself in the region of the descending duodenum  although this  simply may reflect the course of a redundant duodenum. Imaging with contrast injection through the catheter could be used to further assess as clinically appropriate. Electronically Signed   By: Camellia Candle M.D.   On: 04/06/2024 06:14   DG Abd 1 View Result Date: 03/08/2024 CLINICAL DATA:  Abdominal distension. EXAM: ABDOMEN - 1 VIEW COMPARISON:  Radiograph dated 03/03/2024. FINDINGS: Support catheters in similar position. Significant improvement in the small bowel dilatation. There is diffuse air distension of the colon. No free air. No acute osseous pathology. IMPRESSION: Significant improvement in the small bowel dilatation. Electronically Signed   By: Vanetta Chou M.D.   On: 03/08/2024 21:34    Labs:  CBC: Recent Labs    03/09/24 0404 03/11/24 0226 03/12/24 0225 04/06/24 0124  WBC 13.2* 11.6* 10.7* 10.0  HGB 7.9* 8.9* 8.3* 8.9*  HCT 25.4* 28.7* 27.1* 29.1*  PLT 449* 495* 446* 289    COAGS: Recent Labs    02/27/24 0441  INR 1.4*    BMP: Recent Labs    11/11/23 0000 02/26/24 0424 02/26/24 0703 02/26/24 0726 03/09/24 0404 03/11/24 0226 03/12/24 0225 04/06/24 0124  NA  --    < > DUP   < > 143 141 140 136  K  --    < > DUP   < > 3.9 3.9 3.8 3.4*  CL  --    < > DUP   < > 115* 109 106 106  CO2  --    < > DUP   < > 20* 22 21* 17*  GLUCOSE  --    < > DUP   < > 138* 97 77 84  BUN  --    < > DUP   < > 13 17 17  5*  CALCIUM   --    < > DUP   < > 9.2 9.6 9.5 9.3  CREATININE  --    < > DUP   < > <0.30* <0.30* <0.30* <0.30*  GFRNONAA  --    < > DUP   < > NOT CALCULATED NOT CALCULATED NOT CALCULATED NOT CALCULATED  GFRAA 160  --  DUP  --   --   --   --   --    < > = values in this interval not displayed.    LIVER FUNCTION TESTS: Recent Labs    03/07/24 0500 03/08/24 0356 03/09/24 0404 03/11/24 0226 03/12/24 0225  BILITOT 1.1 1.3* 1.2 1.3*  --   AST 37 31 29 23   --   ALT 48* 44 40 33  --   ALKPHOS 414* 416* 362* 341*  --   PROT 7.4 7.4 7.5 8.1  --    ALBUMIN 2.5* 2.6* 2.7* 2.9* 2.8*    TUMOR MARKERS: No results for input(s): AFPTM, CEA, CA199, CHROMGRNA in the last 8760 hours.  Assessment and Plan: Per Mr. Kendall EDP note yesterday 10/2: Patient with h/o advanced ALS s/p trach with vent dependence, feeding tube Gtube to GJ tube conversion 03/01/24, recent admission for cholecystitis with placement of cholecystostomy tube--presents with family and caregiver.  They report that there was abnormal movement inside of the J-tube, difficulty removing tube feed from this port today due to the abnormal movement, and now inability to flush this port.  G-tube appears to be working normally.  Caregiver mentions that family was requesting a different type of feeding tube.  Patient will present for tentatively scheduled gastrojejunostomy tube exchange in IR today.  Patient has been NPO since midnight.  All labs and medications are within acceptable parameters.  Allergies reviewed: Crab/shellfish.  Risks and benefits image guided gastrojejunostomy tube exchange was discussed with the patient and his wife, including, but not limited to bleeding, infection, peritonitis and/or damage to adjacent structures.  All were answered, patient and his wife are agreeable to proceed.  Consent signed and at IR charge desk.      Thank you for allowing our service to participate in Jerry [name] 's care.  Electronically Signed: Carlin DELENA Griffon, PA-C   04/06/2024, 12:04 PM      I spent a total of 40 Minutes in face to face in clinical consultation, greater than 50% of which was counseling/coordinating care for occluded J-limb of G-tube, with consideration for exchange.

## 2024-04-06 NOTE — Evaluation (Signed)
 Speech Language Pathology Evaluation Patient Details Name: Jerry [name] MRN: [id number] DOB: [birth year] Today's Date: 04/06/2024 Time: 9065-8997 SLP Time Calculation (min) (ACUTE ONLY): 28 min  Problem List:  Patient Active Problem List   Diagnosis Date Noted   Malfunction of jejunostomy tube (HCC) 04/06/2024   Normocytic anemia 04/06/2024   Hypokalemia 04/06/2024   Abdominal pain 02/26/2024   Sepsis (HCC) 02/26/2024   Acute cholecystitis 02/26/2024   Abnormal liver function tests 01/24/2024   Sequelae of protein-energy malnutrition 01/24/2024   Tetraplegia (HCC) 01/24/2024   On parenteral nutrition 08/31/2023   Failure to thrive in adult 07/27/2023   Intermittent constipation 07/27/2023   On total parenteral nutrition (TPN) 07/27/2023   Primary thrombocytopenia (HCC) 09/25/2022   Chronic respiratory failure with hypoxia (HCC) 06/22/2022   Autonomic dysfunction 01/06/2022   Ventilator dependent (HCC) 01/06/2022   Tracheostomy dependent (HCC) 01/06/2022   Pressure injury of skin 12/27/2021   Anxiety 11/18/2021   Bed sore on buttock, right, unstageable (HCC) 11/18/2021   Severe protein-calorie malnutrition 11/13/2021   Microscopic hematuria 10/29/2021   S/P percutaneous endoscopic gastrostomy (PEG) tube placement (HCC) 10/29/2021   ALS (amyotrophic lateral sclerosis) (HCC) 01/11/2019   Past Medical History:  Past Medical History:  Diagnosis Date   ALS (amyotrophic lateral sclerosis) (HCC)    COVID-19 virus infection 06/2020   Meningitis 2003   spinal   Morbid obesity (HCC)    Neuromuscular disorder (HCC)    Prediabetes 11/05/2016   A1C 5.7 on 11/05/16   Type 2 diabetes mellitus (HCC) 08/31/2023   Past Surgical History:  Past Surgical History:  Procedure Laterality Date   IR CHOLANGIOGRAM EXISTING TUBE  02/29/2024   IR GJ TUBE CHANGE  03/01/2024   IR PERC CHOLECYSTOSTOMY  02/26/2024   IR REPLACE G-TUBE SIMPLE WO FLUORO  06/22/2022   MASS EXCISION Right 11/26/2014    Procedure: EXCISION MASS/GROIN EXCISION MASS;  Surgeon: Reyes LELON Cota, MD;  Location: [facility] ORS;  Service: General;  Laterality: Right;   MASS EXCISION Right 11/26/2014   Procedure: MINOR EXCISION OF MASS/POST THIGH MASS;  Surgeon: Reyes LELON Cota, MD;  Location: [facility] ORS;  Service: General;  Laterality: Right;   PEG PLACEMENT  10/27/2021   PORTACATH PLACEMENT Right 06/19/2019   HPI:  Jerry [name] is a 53 y.o. male with medical history significant for advanced ALS with tracheostomy, ventilator dependence, PEG tube, and cholecystitis with cholecystostomy tube who presents for evaluation of malfunctioning GJ tube.   Assessment / Plan / Recommendation Clinical Impression  SLP ordered to evaluate potential for swallowing and PMV use. Per review of chart, with additional history provided by the pt and his wife, he has not had PO intake since 2023 when he was trached. Prior to the trach, he was on pureed foods, but was starting to have difficulty with secretion management. Since the trach, he has been relying on his GJ tube and also had ongoing difficulties with ileus. With SLP present, pt required oral suction to remove secretions from his mouth.   From a communication standpoint, pt also has not used a PMV since ICU admission in 2023 when he got his trach. Note that pt had significant dysarthria at that time, with intelligibility impacted at the phrase level. Since that time, he has been using gestures (signifies yes/no responses with facial expressions) and is proficient in using his speech generating device via eye gaze. Pt communicated with SLP with Mod I using his device, producing sentence level language that was appropriate.   Note that  vent settings per chart appear to be appropriate for attempt at inline PMV placement. SLP attempted oral motor exam, with limited ROM noted. He has reduced mandible opening, as well as limited lingual movement including no elevation or lateralization. Given the  severity of his dysphagia and dysarthria in 2023, as well as disuse and the progressive nature of ALS, I shared with them that I am not sure a PMV would help promote functional speech even if he can tolerate wearing it and/or produce phonation. Jerry [name] and his wife agreed, but were appreciative of the assessment. Would continue to use alternative means of nutrition and communication.     SLP Assessment  SLP Recommendation/Assessment: All further Speech Language Pathology needs can be addressed in the next venue of care SLP Visit Diagnosis: Aphonia (R49.1)     Assistance Recommended at Discharge  Frequent or constant Supervision/Assistance  Functional Status Assessment Patient has not had a recent decline in their functional status  Frequency and Duration           SLP Evaluation Cognition  Overall Cognitive Status: Within Functional Limits for tasks assessed       Comprehension       Expression Expression Primary Mode of Expression: Augmentative device Verbal Expression Overall Verbal Expression: Appears within functional limits for tasks assessed   Oral / Motor  Motor Speech Overall Motor Speech: Impaired at baseline            Leita SAILOR., M.A. CCC-SLP Acute Rehabilitation Services Office: [phone]  Secure chat preferred  04/06/2024, 1:26 PM

## 2024-04-06 NOTE — Plan of Care (Signed)
 Patient had G-tube change out in IR with no complications.  All vitals remain stable.

## 2024-04-06 NOTE — ED Provider Notes (Signed)
 Care assumed from [facility], PA-C at shift change. Please see their note for further information  Briefly: Patient with advanced ALS, trach dependent, comes in with a clogged J tube.   Unable to get any liquid into the J tube, clogged to the tip. Therefore plan for IR replacement tomorrow. Apparently he requires anesthesia for this procedure and given his comorbid conditions (trach dependent on 24/7 vent) will require admission.   Discussed patient with hospitalist Dr. Charlton who accepts patient for admission.   This is a shared visit with supervising physician Dr. Nettie who has independently evaluated patient & provided guidance in evaluation/management/disposition, in agreement with care     Nora Lauraine LABOR, PA-C 04/06/24 0330    Palumbo, April, MD 04/06/24 9664

## 2024-04-06 NOTE — ED Notes (Signed)
 Provider at bedside to de-clog J tube.

## 2024-04-06 NOTE — Procedures (Signed)
 Interventional Radiology Procedure:   Indications: Malfunction of GJ tube  Procedure: Exchange of GJ tube  Findings: New 24 Fr GJ tube placed.  Tip in proximal jejunum.   Complications: None     EBL: Minimal  Plan: GJ tube is ready to use.   Sharline Lehane R. Philip, MD  Pager: [phone]

## 2024-04-06 NOTE — H&P (Signed)
 History and Physical    Jerry [name] FMW:[id number] DOB: 26-Dec-1970 DOA: 04/05/2024  PCP: [facility]   Patient coming from: Home   Chief Complaint: GJ tube malfunction   HPI: Jerry [name] is a 53 y.o. male with medical history significant for advanced ALS with tracheostomy, ventilator dependence, PEG tube, and cholecystitis with cholecystostomy tube who presents for evaluation of malfunctioning GJ tube.  Patient was recently hospitalized and had cholecystostomy tube placed on 02/26/2024 and G to GJ conversion on 03/01/2024.  He was discharged home on 03/12/2024 and had been doing fairly well until yesterday when his wife noted that the J-tube could not be flushed.  Patient has developed some abdominal distention and intermittent pain.  There are no other new complaints.  ED Course: Upon arrival to the ED, patient is found to be afebrile and saturating at 100% on 2 L/min of supplemental oxygen with normal RR, slightly elevated HR, and stable BP.  Labs are most notable for potassium 3.4, bicarbonate 17, normal WBC, and hemoglobin 8.9.  IR was consulted by the ED PA and hospitalists asked to admit.  Review of Systems:  All other systems reviewed and apart from HPI, are negative.  Past Medical History:  Diagnosis Date   ALS (amyotrophic lateral sclerosis) (HCC)    COVID-19 virus infection 06/2020   Meningitis 2003   spinal   Morbid obesity (HCC)    Neuromuscular disorder (HCC)    Prediabetes 11/05/2016   A1C 5.7 on 11/05/16   Type 2 diabetes mellitus (HCC) 08/31/2023    Past Surgical History:  Procedure Laterality Date   IR CHOLANGIOGRAM EXISTING TUBE  02/29/2024   IR GJ TUBE CHANGE  03/01/2024   IR PERC CHOLECYSTOSTOMY  02/26/2024   IR REPLACE G-TUBE SIMPLE WO FLUORO  06/22/2022   MASS EXCISION Right 11/26/2014   Procedure: EXCISION MASS/GROIN EXCISION MASS;  Surgeon: Reyes LELON Cota, MD;  Location: [facility] ORS;  Service: General;  Laterality: Right;   MASS EXCISION Right  11/26/2014   Procedure: MINOR EXCISION OF MASS/POST THIGH MASS;  Surgeon: Reyes LELON Cota, MD;  Location: [facility] ORS;  Service: General;  Laterality: Right;   PEG PLACEMENT  10/27/2021   PORTACATH PLACEMENT Right 06/19/2019    Social History:   reports that he has never smoked. He has never used smokeless tobacco. He reports that he does not drink alcohol  and does not use drugs.  Allergies  Allergen Reactions   Crab [Shellfish Allergy] Rash    Confirmed no allergy to fish with wife 02/28/24    Family History  Problem Relation Age of Onset   Diabetes Father    Diabetes Mother    Diabetes Sister    Hypertension Sister    Diabetes Brother    Diabetes Sister    Fibromyalgia Sister    Diabetes Sister      Prior to Admission medications   Medication Sig Start Date End Date Taking? Authorizing Provider  cetirizine  HCl (ZYRTEC ) 5 MG/5ML SOLN Take 10 mLs (10 mg total) by mouth at bedtime. 07/27/23   Sowles, Krichna, MD  glycopyrrolate  (ROBINUL ) 1 MG tablet Place 1 tablet (1 mg total) into feeding tube 2 (two) times daily as needed. Patient taking differently: Place 1 mg into feeding tube 2 (two) times daily. 07/27/23   Sowles, Krichna, MD  ipratropium-albuterol  (DUONEB) 0.5-2.5 (3) MG/3ML SOLN USE 3 ML VIA NEBULIZER EVERY 4 HOURS AS NEEDED Patient taking differently: Inhale 3 mLs into the lungs every 4 (four) hours as needed. 11/04/23  Sowles, Krichna, MD  metoCLOPramide  (REGLAN ) 10 MG tablet 1 tablet (10 mg total) by Per J Tube route in the morning, at noon, and at bedtime. 03/13/24 04/12/24  Gonfa, Taye T, MD  Nutritional Supplements (FEEDING SUPPLEMENT, OSMOLITE 1.2 CAL,) LIQD Place 50 mL/hr into feeding tube continuous. 03/12/24 04/11/24  Gonfa, Taye T, MD  polyethylene glycol powder (GLYCOLAX /MIRALAX ) 17 GM/SCOOP powder Place 17 g into feeding tube daily. Titrate as needed for constipation 07/27/23   Sowles, Krichna, MD  Polyvinyl Alcohol -Povidone (CLEAR EYES NATURAL TEARS) 5-6 MG/ML SOLN  Apply 1 drop to eye 3 (three) times daily as needed. 07/27/23   Sowles, Krichna, MD  Potassium Chloride  40 MEQ/15ML (20%) SOLN TAKE 15 ML BY MOUTH TWICE DAILY 08/26/23   Sowles, Krichna, MD  Protein (FEEDING SUPPLEMENT, PROSOURCE TF20,) liquid Place 60 mLs into feeding tube daily. 03/13/24   Gonfa, Taye T, MD  pyridostigmine  (MESTINON ) 60 MG/5ML solution Place 2.5 mLs (30 mg total) into feeding tube 2 (two) times daily. 03/12/24   Gonfa, Taye T, MD  scopolamine  (TRANSDERM-SCOP) 1 MG/3DAYS PLACE 1 PATCH ONTO THE SKIN EVERY 3 DAYS 08/18/23   Sowles, Krichna, MD  Simethicone  40 MG/0.6ML LIQD 1.2 mLs (80 mg total) by Per J Tube route in the morning, at noon, in the evening, and at bedtime. 03/12/24   Gonfa, Taye T, MD  sodium chloride  flush (NS) 0.9 % SOLN 10-40 mLs by Intracatheter route every 12 (twelve) hours. 03/12/24   Gonfa, Taye T, MD  triamcinolone  cream (KENALOG ) 0.1 % Apply 1 Application topically 2 (two) times daily. Patient not taking: Reported on 02/26/2024 07/27/23   Sowles, Krichna, MD  Water  For Irrigation, Sterile (FREE WATER ) SOLN Place 125 mLs into feeding tube every 6 (six) hours. 03/12/24   Kathrin Mignon DASEN, MD    Physical Exam: Vitals:   04/06/24 0115 04/06/24 0143 04/06/24 0230 04/06/24 0300  BP: 110/77 116/78 97/75   Pulse: 86 97 85 85  Resp:  18  16  Temp:  98.8 F (37.1 C)    TempSrc:  Oral    SpO2: 100% 100% 99% 100%  Weight:      Height:         Constitutional: NAD, no pallor or diaphoresis   Eyes: PERTLA, lids and conjunctivae normal ENMT: Mucous membranes are moist. Posterior pharynx clear of any exudate or lesions.   Neck: supple, no masses  Respiratory: coarse rales bilaterally, no wheezing. No accessory muscle use.  Cardiovascular: S1 & S2 heard, regular rate and rhythm. Trace extremity edema.  Abdomen: Soft, distended. Bowel sounds active.  Musculoskeletal: no clubbing / cyanosis. No joint deformity upper and lower extremities.   Skin: no significant rashes, lesions,  ulcers. Warm, dry, well-perfused. Neurologic: No gross facial asymmetry. Quadriplegia, aphasia. Communicating with eye-gaze tracker device on computer. Alert and oriented to person, place, and situation.  Psychiatric: Calm. Cooperative.    Labs and Imaging on Admission: I have personally reviewed following labs and imaging studies  CBC: Recent Labs  Lab 04/06/24 0124  WBC 10.0  NEUTROABS 7.3  HGB 8.9*  HCT 29.1*  MCV 84.8  PLT 289   Basic Metabolic Panel: Recent Labs  Lab 04/06/24 0124  NA 136  K 3.4*  CL 106  CO2 17*  GLUCOSE 84  BUN 5*  CREATININE <0.30*  CALCIUM  9.3   GFR: CrCl cannot be calculated (This lab value cannot be used to calculate CrCl because it is not a number: <0.30). Liver Function Tests: No results for input(s):  AST, ALT, ALKPHOS, BILITOT, PROT, ALBUMIN in the last 168 hours. No results for input(s): LIPASE, AMYLASE in the last 168 hours. No results for input(s): AMMONIA in the last 168 hours. Coagulation Profile: No results for input(s): INR, PROTIME in the last 168 hours. Cardiac Enzymes: No results for input(s): CKTOTAL, CKMB, CKMBINDEX, TROPONINI in the last 168 hours. BNP (last 3 results) No results for input(s): PROBNP in the last 8760 hours. HbA1C: No results for input(s): HGBA1C in the last 72 hours. CBG: No results for input(s): GLUCAP in the last 168 hours. Lipid Profile: No results for input(s): CHOL, HDL, LDLCALC, TRIG, CHOLHDL, LDLDIRECT in the last 72 hours. Thyroid Function Tests: No results for input(s): TSH, T4TOTAL, FREET4, T3FREE, THYROIDAB in the last 72 hours. Anemia Panel: No results for input(s): VITAMINB12, FOLATE, FERRITIN, TIBC, IRON, RETICCTPCT in the last 72 hours. Urine analysis:    Component Value Date/Time   COLORURINE AMBER (A) 02/26/2024 0343   APPEARANCEUR HAZY (A) 02/26/2024 0343   LABSPEC >1.046 (H) 02/26/2024 0343   PHURINE 7.0  02/26/2024 0343   GLUCOSEU 50 (A) 02/26/2024 0343   HGBUR NEGATIVE 02/26/2024 0343   BILIRUBINUR NEGATIVE 02/26/2024 0343   KETONESUR 5 (A) 02/26/2024 0343   PROTEINUR 30 (A) 02/26/2024 0343   NITRITE NEGATIVE 02/26/2024 0343   LEUKOCYTESUR MODERATE (A) 02/26/2024 0343   Sepsis Labs: @LABRCNTIP (procalcitonin:4,lacticidven:4) )No results found for this or any previous visit (from the past 240 hours).   Radiological Exams on Admission: No results found.   Assessment/Plan   1. GJ tube malfunction  - Attempts to unclog in ED were unsuccessful and IR was consulted    2. ALS; chronic hypoxic respiratory failure; ventilator dependence - Consult RT, continue trach care, supplemental O2, supportive care    3. Cholecystitis  - Leukocytosis has resolved  - Continue cholecystostomy tube care    4. Anemia  - Appears stable   5. Hypokalemia  - Replacing   DVT prophylaxis: SCDs  Code Status: Full  Level of Care: Level of care: Progressive Family Communication: Wife at bedside  Disposition Plan:  Patient is from: Home Anticipated d/c is to: Home  Anticipated d/c date is: 10/3 or 04/07/24  Patient currently: Pending IR evaluation, restoration of tube function  Consults called: IR  Admission status: Observation     Evalene GORMAN Sprinkles, MD [facility]  04/06/2024, 3:42 AM

## 2024-04-06 NOTE — ED Notes (Signed)
 Per spouse request, RT was called for additional supplies. RT to bedside, states ok for pt to remain on personal ventilator.

## 2024-04-06 NOTE — Progress Notes (Addendum)
 Jerry [name] is a 53 y.o. male with medical history significant for advanced ALS with tracheostomy, ventilator dependence, PEG tube, and cholecystitis with cholecystostomy tube who was admitted for evaluation of malfunctioning GJ tube which had been malfunctioning.  Patient seen and examined earlier today, wife at the bedside.  Patient had no complaint.  Patient can communicate through the teleprompter.  Awaiting IR to perform the procedure.  Abdomen slightly distended but abdominal x-ray ruled out any SBO.  Only gaseous distention.  Despite of being distended abdomen, it was nontender.  Lungs clear to auscultation.  Patient comfortable. Total time spent 35 minutes

## 2024-04-07 DIAGNOSIS — K810 Acute cholecystitis: Secondary | ICD-10-CM | POA: Diagnosis not present

## 2024-04-07 DIAGNOSIS — K9413 Enterostomy malfunction: Secondary | ICD-10-CM | POA: Diagnosis not present

## 2024-04-07 DIAGNOSIS — G1221 Amyotrophic lateral sclerosis: Secondary | ICD-10-CM | POA: Diagnosis not present

## 2024-04-07 DIAGNOSIS — J9611 Chronic respiratory failure with hypoxia: Secondary | ICD-10-CM | POA: Diagnosis not present

## 2024-04-07 LAB — CBC
HCT: 27.2 % — ABNORMAL LOW (ref 39.0–52.0)
Hemoglobin: 8.4 g/dL — ABNORMAL LOW (ref 13.0–17.0)
MCH: 25.8 pg — ABNORMAL LOW (ref 26.0–34.0)
MCHC: 30.9 g/dL (ref 30.0–36.0)
MCV: 83.7 fL (ref 80.0–100.0)
Platelets: 291 K/uL (ref 150–400)
RBC: 3.25 MIL/uL — ABNORMAL LOW (ref 4.22–5.81)
RDW: 15.9 % — ABNORMAL HIGH (ref 11.5–15.5)
WBC: 12.5 K/uL — ABNORMAL HIGH (ref 4.0–10.5)
nRBC: 0 % (ref 0.0–0.2)

## 2024-04-07 LAB — GLUCOSE, CAPILLARY
Glucose-Capillary: 153 mg/dL — ABNORMAL HIGH (ref 70–99)
Glucose-Capillary: 54 mg/dL — ABNORMAL LOW (ref 70–99)
Glucose-Capillary: 75 mg/dL (ref 70–99)
Glucose-Capillary: 75 mg/dL (ref 70–99)

## 2024-04-07 LAB — MAGNESIUM: Magnesium: 1.8 mg/dL (ref 1.7–2.4)

## 2024-04-07 LAB — BASIC METABOLIC PANEL WITH GFR
Anion gap: 15 (ref 5–15)
BUN: 6 mg/dL (ref 6–20)
CO2: 14 mmol/L — ABNORMAL LOW (ref 22–32)
Calcium: 9.2 mg/dL (ref 8.9–10.3)
Chloride: 109 mmol/L (ref 98–111)
Creatinine, Ser: 0.43 mg/dL — ABNORMAL LOW (ref 0.61–1.24)
GFR, Estimated: >60 mL/min (ref >60)
Glucose, Bld: 54 mg/dL — ABNORMAL LOW (ref 70–99)
Potassium: 3.5 mmol/L (ref 3.5–5.1)
Sodium: 138 mmol/L (ref 135–145)

## 2024-04-07 LAB — PHOSPHORUS: Phosphorus: 3.3 mg/dL (ref 2.5–4.6)

## 2024-04-07 LAB — MRSA NEXT GEN BY PCR, NASAL: MRSA by PCR Next Gen: NOT DETECTED

## 2024-04-07 MED ORDER — METOCLOPRAMIDE HCL 5 MG/5ML PO SOLN
10.0000 mg | Freq: Three times a day (TID) | ORAL | Status: DC | PRN
  Administered 2024-04-07: 10 mg
  Filled 2024-04-07 (×3): qty 10

## 2024-04-07 MED ORDER — GLYCOPYRROLATE PEDIATRIC ORAL SYRINGE 0.2 MG/ML
1000.0000 ug | Freq: Two times a day (BID) | ORAL | Status: DC
  Administered 2024-04-07 – 2024-04-08 (×3): 1000 ug
  Filled 2024-04-07 (×4): qty 5

## 2024-04-07 MED ORDER — OSMOLITE 1.2 CAL PO LIQD
1000.0000 mL | ORAL | Status: DC
  Administered 2024-04-07: 1000 mL
  Filled 2024-04-07 (×3): qty 1000

## 2024-04-07 MED ORDER — FREE WATER
150.0000 mL | Freq: Four times a day (QID) | Status: DC
  Administered 2024-04-07 – 2024-04-08 (×5): 150 mL

## 2024-04-07 MED ORDER — DEXTROSE 50 % IV SOLN
25.0000 g | INTRAVENOUS | Status: AC
  Administered 2024-04-07: 25 g via INTRAVENOUS
  Filled 2024-04-07: qty 50

## 2024-04-07 MED ORDER — GLYCOPYRROLATE 1 MG PO TABS: 1.0000 mg | ORAL_TABLET | Freq: Two times a day (BID) | ORAL | Status: DC

## 2024-04-07 NOTE — Progress Notes (Signed)
 eLink Physician-Brief Progress Note Patient Name: Jerry [name] DOB: August 05, 1970 MRN: [id number]   Date of Service  04/07/2024  HPI/Events of Note  53 year old with trach/PEG dependent ALS with known cholecystectomy was admitted for malfunctioning GJ tube.  Frequent PVCs, electrolytes have been repleted this morning.  Last EKG reviewed.  eICU Interventions  No intervention indicated.  Replete a.m. electrolytes and resume feeds as tolerated     Intervention Category Intermediate Interventions: Electrolyte abnormality - evaluation and management  Shashana Fullington 04/07/2024, 8:46 PM

## 2024-04-07 NOTE — Progress Notes (Signed)
 Initial Nutrition Assessment  DOCUMENTATION CODES:   Not applicable  INTERVENTION:   J-port of G-J tubes are very high risk for clogging. Ensure free water  flush of at least 60 mL (syringe full) for any interruption in TF infusion. If meds per tube is required, recommend only liquid medicines if possible with free water  flush before and after administration.   Noted several tablets ordered per J-tube on home med list which may have contributed to clog. Recommend changing any medicines that must be given per tube to liquid form if feasible. Reached out to Pharmacy and MD to discuss If reglan  is resumed, recommend using liquid form.   Tube Feeding via J-Port of G-J tube: Osmolite 1.2 at 60 ml/hr  Begin TF at rate of 20 ml/hr, titrate by 10 mL q 6 hours until goal rate of 60 ml/hr Goal TF regimen provides 80 g of protein, 1728 kcals, 1166 mL of free water   Recommend additional free water  flush of 150 mL QID. Provides additional 600 mL of free water  to meet hydration needs.   Add Phosphorus as add-on today; recommend supplementing if low. Recommend checking potassium, phosphorus and magnesium  tomorrow AM as well; if low recommend continuing to check daily with supplementation as needed.    NUTRITION DIAGNOSIS:   Inadequate oral intake related to inability to eat, altered GI function (G-J malfunction) as evidenced by NPO status.  GOAL:   Patient will meet greater than or equal to 90% of their needs  MONITOR:   TF tolerance, I & O's, Skin, Weight trends, Labs  REASON FOR ASSESSMENT:   Consult Enteral/tube feeding initiation and management  ASSESSMENT:   53 yo male admitted with malfunctioning G-J tube; wife unable to flush, abdomen distended with intermittent pain. Noted hx of G tube with recent conversion to G-J tube on 03/01/24, hx TPN dependence-no longer on TPN. PMH includes advanced ALS with trach/vent dependence, cholecystitis with cholecystostomy tube  10/03 New 24 Fr GJ  tube placed by IR with tip in proximal jejunum  Consult received to resume home TF regimen via new G-J tube  Pt last seen by Inpatient RD on 03/06/24. See previous RD notes.   Per Home Med List, pt on Reglan  tablet and Glycopyrrolate  Tab per J-tube.  Currently nothing ordered per tube except for Glycopyrrolate  Tablet. RD to discuss with MD and Pharmacy to see if liquid option is available. J-prt of G-J tubes usually very small french size, high risk for clogging. Meds are not recommended per tube unless unable to give via different route. Liquid versions are recommended if per tube.  Pt's with ALS with increased calories needs, even when bed bound, due to hypercatabolic state. Plan to adjust nutritional needs and TF regimen. Further recommendations pending weight trends, wounds, etc.   +BM No pressure injuries documented  Labs: Potassium 3.5 (wdl) Sodium 138 (wdl) Magnesium  1.8 (wdl) No Phos this admission, last phos 3.1 on 03/12/24  Meds:  Scopolamine  patch Glycopyrrolate  tab per tube  NUTRITION - FOCUSED PHYSICAL EXAM:  Assess on follow-up  Diet Order:   Diet Order             Diet NPO time specified  Diet effective now                   EDUCATION NEEDS:   Not appropriate for education at this time  Skin:  Skin Assessment: Skin Integrity Issues: Skin Integrity Issues:: Other (Comment) Other: Redness/Erythema to Buttocks  Last BM:  10/4 type 6  large BM  Height:   Ht Readings from Last 1 Encounters:  04/05/24 5' 5 (1.651 m)    Weight:   Wt Readings from Last 1 Encounters:  04/06/24 76.5 kg    BMI:  Body mass index is 28.07 kg/m.  Estimated Nutritional Needs:   Kcal:  1650-1850 kcals  Protein:  80-95 g  Fluid:  >/= 1.7 L   Betsey Finger MS, RDN, LDN, CNSC Registered Dietitian 3 Clinical Nutrition RD Inpatient Contact Info in Amion

## 2024-04-07 NOTE — Progress Notes (Signed)
 Patient's wife Doristine called and updated. She is requesting that robinul  be in liquid form for discharge to prevent tube clogging again.

## 2024-04-07 NOTE — Plan of Care (Signed)
  Problem: Clinical Measurements: Goal: Ability to maintain clinical measurements within normal limits will improve Outcome: Progressing   Problem: Clinical Measurements: Goal: Will remain free from infection Outcome: Progressing   Problem: Nutrition: Goal: Adequate nutrition will be maintained Outcome: Progressing   Problem: Pain Managment: Goal: General experience of comfort will improve and/or be controlled Outcome: Progressing   Problem: Safety: Goal: Ability to remain free from injury will improve Outcome: Progressing   Problem: Skin Integrity: Goal: Risk for impaired skin integrity will decrease Outcome: Progressing   Problem: Role Relationship: Goal: Method of communication will improve Outcome: Progressing   Problem: Respiratory: Goal: Ability to maintain a clear airway and adequate ventilation will improve Outcome: Progressing    Plan of care, assessment, monitoring, treatment, and intervention (s) ongoing, see MAR see flowsheet

## 2024-04-07 NOTE — Progress Notes (Signed)
 Jerry [name] [phone] [facility] Liaison Note:    This patient is currently enrolled in [facility] outpatient-based palliative care and Home Based Primary Care.    Hospital Liaison will continue to follow for discharge disposition.    Please call for any outpatient based palliative care related questions or concerns.    Thank you,     Nat Babe, BSN, RN [facility] Liaison [phone]

## 2024-04-07 NOTE — Progress Notes (Signed)
 [facility]  PROGRESS NOTE  Jerry [name] FMW:[id number] DOB: Feb 18, 1971 DOA: 04/05/2024 PCP: [facility]   Brief HPI:   53 y.o. male with medical history significant for advanced ALS with tracheostomy, ventilator dependence, PEG tube, and cholecystitis with cholecystostomy tube who was admitted for evaluation of malfunctioning GJ tube which had been malfunctioning.      Assessment/Plan:   1. GJ tube malfunction  - Attempts to unclog in ED were unsuccessful and IR was consulted   - GJ tube was replaced yesterday Will consult dietitian to start feeding through GJ tube. -Will monitor today for any complication -If stable, likely discharge home in a.m.   2. ALS; chronic hypoxic respiratory failure; ventilator dependence - Consult RT, continue trach care, supplemental O2, supportive care     3. Cholecystitis  - Leukocytosis has resolved  - Continue cholecystostomy tube care     4. Anemia  - Appears stable    5. Hypokalemia  - Replete    Medications     Chlorhexidine  Gluconate Cloth  6 each Topical Daily   mouth rinse  15 mL Mouth Rinse Q2H   scopolamine   1 patch Transdermal Q72H   sodium chloride  flush  3 mL Intravenous Q12H     Data Reviewed:   CBG:  Recent Labs  Lab 04/06/24 1443  GLUCAP 71    SpO2: 100 % O2 Flow Rate (L/min): 4 L/min FiO2 (%): 32 %    Vitals:   04/07/24 0334 04/07/24 0357 04/07/24 0400 04/07/24 0500  BP:   122/79 102/70  Pulse:  92 93 87  Resp:  16 16 16   Temp: 99.2 F (37.3 C)     TempSrc:      SpO2:  100% 100% 100%  Weight:      Height:          Data Reviewed:  Basic Metabolic Panel: Recent Labs  Lab 04/06/24 0124 04/07/24 0510  NA 136 138  K 3.4* 3.5  CL 106 109  CO2 17* 14*  GLUCOSE 84 54*  BUN 5* 6  CREATININE <0.30* 0.43*  CALCIUM  9.3 9.2  MG  --  1.8    CBC: Recent Labs  Lab 04/06/24 0124 04/07/24 0510  WBC 10.0 12.5*  NEUTROABS 7.3  --   HGB 8.9* 8.4*  HCT 29.1* 27.2*  MCV 84.8  83.7  PLT 289 291    LFT No results for input(s): AST, ALT, ALKPHOS, BILITOT, PROT, ALBUMIN in the last 168 hours.   Antibiotics: Anti-infectives (From admission, onward)    None        DVT prophylaxis: SCDs  Code Status: Full code  Family Communication: No family at bedside   CONSULTS    Subjective   GJ tube replaced per IR.  Tube feeding started today.  Denies abdominal pain   Objective    Physical Examination:  Quadriplegia, aphasia, communicates with eye gaze tracker device on computer Answering all questions appropriately Extremities no edema Abdomen is soft, nontender   Status is: Inpatient:             Jerry [name]   [facility] If 7PM-7AM, please contact night-coverage at [email or web address], Office  [phone]   04/07/2024, 8:27 AM  LOS: 0 days

## 2024-04-07 NOTE — Care Management Obs Status (Signed)
 MEDICARE OBSERVATION STATUS NOTIFICATION   Patient Details  Name: Jerry [name] MRN: [id number] Date of Birth: 25-Sep-1970   Medicare Observation Status Notification Given:  Yes    Roxie KANDICE Stain, RN 04/07/2024, 3:31 PM

## 2024-04-08 ENCOUNTER — Other Ambulatory Visit (HOSPITAL_COMMUNITY): Payer: Self-pay

## 2024-04-08 DIAGNOSIS — K9413 Enterostomy malfunction: Secondary | ICD-10-CM | POA: Diagnosis not present

## 2024-04-08 LAB — GLUCOSE, CAPILLARY
Glucose-Capillary: 96 mg/dL (ref 70–99)
Glucose-Capillary: 130 mg/dL — ABNORMAL HIGH (ref 70–99)
Glucose-Capillary: 155 mg/dL — ABNORMAL HIGH (ref 70–99)

## 2024-04-08 LAB — MAGNESIUM: Magnesium: 1.8 mg/dL (ref 1.7–2.4)

## 2024-04-08 LAB — PHOSPHORUS: Phosphorus: 2.4 mg/dL — ABNORMAL LOW (ref 2.5–4.6)

## 2024-04-08 MED ORDER — POTASSIUM PHOSPHATES 15 MMOLE/5ML IV SOLN
30.0000 mmol | Freq: Once | INTRAVENOUS | Status: AC
  Administered 2024-04-08: 30 mmol via INTRAVENOUS
  Filled 2024-04-08: qty 10

## 2024-04-08 MED ORDER — GLYCOPYRROLATE PEDIATRIC ORAL SYRINGE 0.2 MG/ML
1000.0000 ug | Freq: Two times a day (BID) | ORAL | 0 refills | Status: AC
  Filled 2024-04-08 – 2024-04-09 (×2): qty 300, 30d supply, fill #0

## 2024-04-08 MED ORDER — GLYCOPYRROLATE PEDIATRIC ORAL SYRINGE 0.2 MG/ML
1000.0000 ug | Freq: Two times a day (BID) | ORAL | 0 refills | Status: DC
  Filled 2024-04-08: qty 437, 44d supply, fill #0

## 2024-04-08 MED ORDER — HEPARIN SOD (PORK) LOCK FLUSH 100 UNIT/ML IV SOLN
500.0000 [IU] | INTRAVENOUS | Status: AC | PRN
  Administered 2024-04-08: 500 [IU]

## 2024-04-08 MED ORDER — METOCLOPRAMIDE HCL 5 MG/5ML PO SOLN
10.0000 mg | Freq: Three times a day (TID) | ORAL | 0 refills | Status: DC | PRN
  Filled 2024-04-08: qty 120, 4d supply, fill #0

## 2024-04-08 MED ORDER — METOCLOPRAMIDE HCL 5 MG/5ML PO SOLN
10.0000 mg | Freq: Three times a day (TID) | ORAL | 0 refills | Status: AC | PRN
  Filled 2024-04-08: qty 120, 4d supply, fill #0

## 2024-04-08 NOTE — Discharge Summary (Addendum)
 Physician Discharge Summary  Jerry [name] FMW:[id number] DOB: 1970-07-29 DOA: 04/05/2024  PCP: [facility]  Admit date: 04/05/2024 Discharge date: 04/08/2024  Admitted From: Home Disposition:  Home  Discharge Condition:Stable CODE STATUS:FULL Diet recommendation: Tube diet  Brief/Interim Summary: 53 y.o. male with medical history significant for advanced ALS with tracheostomy, ventilator dependence, PEG tube, and cholecystitis with cholecystostomy tube who was admitted for evaluation of malfunctioning GJ tube which had been malfunctioning.  IR consulted.  Underwent exchange of GJ tube.  Tube functioning well this morning.  Medically stable for discharge  Following problems were addressed during the hospitalization:  1. GJ tube malfunction  - Attempts to unclog in ED were unsuccessful and IR was consulted   - GJ tube was replaced Consulted  dietitian to start feeding through GJ tube.Tube functioning today - Glycopyrrolate  and metoclopramide  changed to liquid form.  2. ALS; chronic hypoxic respiratory failure; ventilator dependence - Consult RT, continue trach care, supplemental O2, supportive care     3. H/O Cholecystitis  - Leukocytosis has resolved  - Continue cholecystostomy tube care     4. Anemia  - Hb appears stable    5. Hypokalemia /hypophosphatemia - Repleted   Discharge Diagnoses:  Principal Problem:   Malfunction of jejunostomy tube (HCC) Active Problems:   ALS (amyotrophic lateral sclerosis) (HCC)   Ventilator dependent (HCC)   Tracheostomy dependent (HCC)   Chronic respiratory failure with hypoxia (HCC)   Tetraplegia (HCC)   Acute cholecystitis   Normocytic anemia   Hypokalemia   Ventilator dependence (HCC)    Discharge Instructions  Discharge Instructions     Diet general   Complete by: As directed    Tube diet   Discharge instructions   Complete by: As directed    1)Please follow up with your PCP next week   Increase activity  slowly   Complete by: As directed       Allergies as of 04/08/2024       Reactions   Crab [shellfish Allergy] Rash   Confirmed no allergy to fish with wife 02/28/24        Medication List     STOP taking these medications    glycopyrrolate  1 MG tablet Commonly known as: ROBINUL  Replaced by: glycopyrrolate  0.2 mg/ml Soln   metoCLOPramide  10 MG tablet Commonly known as: REGLAN  Replaced by: metoCLOPramide  5 MG/5ML solution       TAKE these medications    cetirizine  HCl 5 MG/5ML Soln Commonly known as: Zyrtec  Take 10 mLs (10 mg total) by mouth at bedtime. What changed: how to take this   Clear Eyes Natural Tears 5-6 MG/ML Soln Generic drug: Polyvinyl Alcohol -Povidone Apply 1 drop to eye 3 (three) times daily as needed. What changed:  how to take this when to take this reasons to take this   feeding supplement (OSMOLITE 1.2 CAL) Liqd Place 50 mL/hr into feeding tube continuous. What changed: how much to take   free water  Soln Place 125 mLs into feeding tube every 6 (six) hours. What changed: when to take this   glycopyrrolate  0.2 mg/ml Soln Commonly known as: CUVPOSA  Place 5 mLs (1,000 mcg total) into feeding tube 2 (two) times daily. Replaces: glycopyrrolate  1 MG tablet   ipratropium-albuterol  0.5-2.5 (3) MG/3ML Soln Commonly known as: DUONEB USE 3 ML VIA NEBULIZER EVERY 4 HOURS AS NEEDED What changed: See the new instructions.   metoCLOPramide  5 MG/5ML solution Commonly known as: REGLAN  Place 10 mLs (10 mg total) into feeding tube every 8 (eight)  hours as needed for nausea. Replaces: metoCLOPramide  10 MG tablet   polyethylene glycol powder 17 GM/SCOOP powder Commonly known as: GLYCOLAX /MIRALAX  Place 17 g into feeding tube daily. Titrate as needed for constipation What changed:  when to take this reasons to take this additional instructions   Potassium Chloride  40 MEQ/15ML (20%) Soln TAKE 15 ML BY MOUTH TWICE DAILY   pyridostigmine  60 MG/5ML  solution Commonly known as: MESTINON  Place 2.5 mLs (30 mg total) into feeding tube 2 (two) times daily.   scopolamine  1 MG/3DAYS Commonly known as: TRANSDERM-SCOP PLACE 1 PATCH ONTO THE SKIN EVERY 3 DAYS   Simethicone  40 MG/0.6ML Liqd 1.2 mLs (80 mg total) by Per J Tube route in the morning, at noon, in the evening, and at bedtime. What changed:  how to take this when to take this   sodium chloride  flush 0.9 % Soln Commonly known as: NS 10-40 mLs by Intracatheter route every 12 (twelve) hours. What changed: how much to take        Follow-up Information     [facility]. Schedule an appointment as soon as possible for a visit in 1 week(s).   Contact information: [address] [place] [address] [phone]                Allergies  Allergen Reactions   Crab [Shellfish Allergy] Rash    Confirmed no allergy to fish with wife 02/28/24    Consultations: IR,PCCM   Procedures/Studies: IR GJ Tube Change Result Date: 04/06/2024 INDICATION: 53 year old with advanced ALS.  Malfunctioning GJ tube. EXAM: EXCHANGE OF GJ TUBE WITH FLUOROSCOPY MEDICATIONS: None ANESTHESIA/SEDATION: None CONTRAST:  20 mL Omnipaque  300 FLUOROSCOPY: Radiation Exposure Index (as provided by the fluoroscopic device): 9 mGy Kerma COMPLICATIONS: None immediate. PROCEDURE: Informed consent was obtained for exchange of the GJ tube. Maximal Sterile Barrier Technique was utilized including caps, mask, sterile gowns, sterile gloves, sterile drape, hand hygiene and skin antiseptic. A timeout was performed prior to the initiation of the procedure. Patient was placed supine on the interventional table. The existing GJ tube was prepped and draped in sterile fashion. Fluoroscopy was used to evaluate the GJ tube. The balloon was deflated. The GJ tube was partially removed. The J tube was cut and a stiff Glidewire was placed. The remainder of the J tube was removed over the wire. A 5 French Kumpe  catheter was advanced into the proximal jejunum using the stiff Glidewire. New 24 French GJ tube was advanced over the wire. The jejunal tip was placed in the proximal jejunum. GJ tube balloon was inflated with 10 mL of saline. The gastric and jejunal lumens were both injected with contrast to confirm placement. Both lumens were then flushed with saline. Fluoroscopic images were taken and saved for this procedure. FINDINGS: The old tube was coiled in the duodenum. New GJ tube tip is in the proximal jejunum. IMPRESSION: Successful exchange of the GJ tube with fluoroscopy. The tube is ready to be used. Electronically Signed   By: Juliene Balder M.D.   On: 04/06/2024 17:22   DG Abd 1 View Result Date: 04/06/2024 CLINICAL DATA:  Abdominal distention. EXAM: ABDOMEN - 1 VIEW COMPARISON:  03/08/2024 FINDINGS: The fairly marked gaseous distention of colon is similar to prior. Underlying gaseous distension of small bowel is also similar. Right-sided pigtail drainage catheter remains in place. A GJ tube is again noted with some redundant catheter in the stomach. The tip does appear to be transpyloric and is looped upon itself in  the region of the descending duodenum, new in the interval. IMPRESSION: 1. No substantial change in the marked gaseous distention of colon with persistent minimal small bowel gas evident. 2. GJ tube tip appears to be transpyloric and is looped upon itself in the region of the descending duodenum although this simply may reflect the course of a redundant duodenum. Imaging with contrast injection through the catheter could be used to further assess as clinically appropriate. Electronically Signed   By: Camellia Candle M.D.   On: 04/06/2024 06:14      Subjective: Patient seen and examined at bedside today.  Hemodynamically stable.  Comfortable.  Lying in bed.  GJ tube functioning well.  He communicates well with the help of iPad.  I called his wife and discussed about discharge planning.  Medically  stable for discharge today.  Discharge Exam: Vitals:   04/08/24 0700 04/08/24 0831  BP: (!) 105/58   Pulse: 76   Resp: 16   Temp:  98.5 F (36.9 C)  SpO2: 100%    Vitals:   04/08/24 0500 04/08/24 0600 04/08/24 0700 04/08/24 0831  BP: 113/79 (!) 141/91 (!) 105/58   Pulse: 95 88 76   Resp: 16 13 16    Temp:  98.6 F (37 C)  98.5 F (36.9 C)  TempSrc:  Oral  Oral  SpO2: 100% 100% 100%   Weight: 72.1 kg     Height:        General: Pt is alert, awake, not in acute distress, quadriplegia Cardiovascular: RRR, S1/S2 +, no rubs, no gallops Respiratory: CTA bilaterally, no wheezing, no rhonchi Abdominal: Soft, NT, ND, bowel sounds +, GJ tube Extremities: no edema, no cyanosis    The results of significant diagnostics from this hospitalization (including imaging, microbiology, ancillary and laboratory) are listed below for reference.     Microbiology: Recent Results (from the past 240 hours)  MRSA Next Gen by PCR, Nasal     Status: None   Collection Time: 04/07/24  8:47 PM   Specimen: Nasal Mucosa; Nasal Swab  Result Value Ref Range Status   MRSA by PCR Next Gen NOT DETECTED NOT DETECTED Final    Comment: (NOTE) The GeneXpert MRSA Assay (FDA approved for NASAL specimens only), is one component of a comprehensive MRSA colonization surveillance program. It is not intended to diagnose MRSA infection nor to guide or monitor treatment for MRSA infections. Test performance is not FDA approved in patients less than 32 years old. Performed at [facility], [address], [place], [place] [address]      Labs: BNP (last 3 results) No results for input(s): BNP in the last 8760 hours. Basic Metabolic Panel: Recent Labs  Lab 04/06/24 0124 04/07/24 0500 04/07/24 0510 04/08/24 0347  NA 136  --  138  --   K 3.4*  --  3.5  --   CL 106  --  109  --   CO2 17*  --  14*  --   GLUCOSE 84  --  54*  --   BUN 5*  --  6  --   CREATININE <0.30*  --  0.43*  --   CALCIUM  9.3   --  9.2  --   MG  --   --  1.8 1.8  PHOS  --  3.3  --  2.4*   Liver Function Tests: No results for input(s): AST, ALT, ALKPHOS, BILITOT, PROT, ALBUMIN in the last 168 hours. No results for input(s): LIPASE, AMYLASE in  the last 168 hours. No results for input(s): AMMONIA in the last 168 hours. CBC: Recent Labs  Lab 04/06/24 0124 04/07/24 0510  WBC 10.0 12.5*  NEUTROABS 7.3  --   HGB 8.9* 8.4*  HCT 29.1* 27.2*  MCV 84.8 83.7  PLT 289 291   Cardiac Enzymes: No results for input(s): CKTOTAL, CKMB, CKMBINDEX, TROPONINI in the last 168 hours. BNP: Invalid input(s): POCBNP CBG: Recent Labs  Lab 04/07/24 1613 04/07/24 1934 04/07/24 2253 04/08/24 0336 04/08/24 0830  GLUCAP 153* 75 75 96 130*   D-Dimer No results for input(s): DDIMER in the last 72 hours. Hgb A1c No results for input(s): HGBA1C in the last 72 hours. Lipid Profile No results for input(s): CHOL, HDL, LDLCALC, TRIG, CHOLHDL, LDLDIRECT in the last 72 hours. Thyroid function studies No results for input(s): TSH, T4TOTAL, T3FREE, THYROIDAB in the last 72 hours.  Invalid input(s): FREET3 Anemia work up No results for input(s): VITAMINB12, FOLATE, FERRITIN, TIBC, IRON, RETICCTPCT in the last 72 hours. Urinalysis    Component Value Date/Time   COLORURINE AMBER (A) 02/26/2024 0343   APPEARANCEUR HAZY (A) 02/26/2024 0343   LABSPEC >1.046 (H) 02/26/2024 0343   PHURINE 7.0 02/26/2024 0343   GLUCOSEU 50 (A) 02/26/2024 0343   HGBUR NEGATIVE 02/26/2024 0343   BILIRUBINUR NEGATIVE 02/26/2024 0343   KETONESUR 5 (A) 02/26/2024 0343   PROTEINUR 30 (A) 02/26/2024 0343   NITRITE NEGATIVE 02/26/2024 0343   LEUKOCYTESUR MODERATE (A) 02/26/2024 0343   Sepsis Labs Recent Labs  Lab 04/06/24 0124 04/07/24 0510  WBC 10.0 12.5*   Microbiology Recent Results (from the past 240 hours)  MRSA Next Gen by PCR, Nasal     Status: None   Collection Time:  04/07/24  8:47 PM   Specimen: Nasal Mucosa; Nasal Swab  Result Value Ref Range Status   MRSA by PCR Next Gen NOT DETECTED NOT DETECTED Final    Comment: (NOTE) The GeneXpert MRSA Assay (FDA approved for NASAL specimens only), is one component of a comprehensive MRSA colonization surveillance program. It is not intended to diagnose MRSA infection nor to guide or monitor treatment for MRSA infections. Test performance is not FDA approved in patients less than 102 years old. Performed at [facility], [address], [place], [place] [address]     Please note: You were cared for by a hospitalist during your hospital stay. Once you are discharged, your primary care physician will handle any further medical issues. Please note that NO REFILLS for any discharge medications will be authorized once you are discharged, as it is imperative that you return to your primary care physician (or establish a relationship with a primary care physician if you do not have one) for your post hospital discharge needs so that they can reassess your need for medications and monitor your lab values.    Time coordinating discharge: 40 minutes  SIGNED:   Ivonne Mustache, MD  [facility] 04/08/2024, 11:04 AM Pager [phone]  If 7PM-7AM, please contact night-coverage [email or web address] Password [email or web address]

## 2024-04-09 ENCOUNTER — Other Ambulatory Visit: Payer: Self-pay

## 2024-04-09 ENCOUNTER — Other Ambulatory Visit (HOSPITAL_COMMUNITY): Payer: Self-pay

## 2024-04-10 ENCOUNTER — Other Ambulatory Visit (HOSPITAL_COMMUNITY): Payer: Self-pay

## 2024-04-10 ENCOUNTER — Telehealth (HOSPITAL_COMMUNITY): Payer: Self-pay | Admitting: Pharmacy Technician

## 2024-04-10 ENCOUNTER — Other Ambulatory Visit: Payer: Self-pay

## 2024-04-10 NOTE — Telephone Encounter (Signed)
 Pharmacy Patient Advocate Encounter  Received notification from [employer] that Prior Authorization for Glycopyrrolate  1MG /5ML solution  has been DENIED.  Full denial letter will be uploaded to the media tab. See denial reason below. You asked for the drug above for your Disturbances of salivary secretion. Medicare rules say the use of a drug must be reasonable and necessary for the treatment of your illness. We look at the two major drug guides (these drug guides are called the [facility] Formulary Service-Drug Information (AHFS-DI), [facility] Micromedex DrugDEX) and relevant studies (clinical trials) to decide if the use meets the reasonable and necessary standard. These medical standards are based on who is most likely to have more benefits than risks with this drug. The accepted use of this drug for your condition requires the member is between 71 and 54 years old. You do not meet these criteria. Your provider can send us  new information to show that you meet these criteria. Your provider can also tell us  why they should not apply to you. The Medicare rule is in the Prescription Drug Benefit Manual (Chapter 6, Section 20.4). Also, this drug is not on your preferred drug list (formulary). If new information shows you have an accepted use, you still need to try the preferred drug(s) first: benztropine tablet. Your provider can also tell us  why the preferred drug(s) would not work for you or if they would cause bad side effects.    PA #/Case ID/Reference #: [id number]

## 2024-04-10 NOTE — Telephone Encounter (Signed)
 Pharmacy Patient Advocate Encounter   Received notification from Fax that prior authorization for Glycopyrrolate  1MG /5ML solution  is required/requested.   Insurance verification completed.   The patient is insured through [employer].   Per test claim: PA required; PA submitted to above mentioned insurance via Latent Key/confirmation #/EOC [id number] Status is pending

## 2024-04-12 ENCOUNTER — Other Ambulatory Visit: Payer: Self-pay

## 2024-04-16 ENCOUNTER — Other Ambulatory Visit: Payer: Self-pay

## 2024-04-17 ENCOUNTER — Other Ambulatory Visit: Payer: Self-pay

## 2024-04-18 ENCOUNTER — Other Ambulatory Visit: Payer: Self-pay

## 2024-04-27 ENCOUNTER — Other Ambulatory Visit: Payer: Self-pay

## 2024-05-28 ENCOUNTER — Other Ambulatory Visit: Payer: Self-pay

## 2024-05-28 ENCOUNTER — Observation Stay (HOSPITAL_COMMUNITY): Admission: EM | Admit: 2024-05-28 | Discharge: 2024-06-01 | Disposition: A | Discharge status: Home / Self Care

## 2024-05-28 ENCOUNTER — Emergency Department (HOSPITAL_COMMUNITY)

## 2024-05-28 ENCOUNTER — Inpatient Hospital Stay (HOSPITAL_COMMUNITY)

## 2024-05-28 ENCOUNTER — Encounter (HOSPITAL_COMMUNITY): Payer: Self-pay

## 2024-05-28 DIAGNOSIS — G1221 Amyotrophic lateral sclerosis: Secondary | ICD-10-CM | POA: Diagnosis present

## 2024-05-28 DIAGNOSIS — K9423 Gastrostomy malfunction: Principal | ICD-10-CM | POA: Diagnosis present

## 2024-05-28 DIAGNOSIS — Z9911 Dependence on respirator [ventilator] status: Secondary | ICD-10-CM

## 2024-05-28 LAB — BASIC METABOLIC PANEL WITH GFR
Anion gap: 13 (ref 5–15)
BUN: 9 mg/dL (ref 6–20)
CO2: 21 mmol/L — ABNORMAL LOW (ref 22–32)
Calcium: 10.2 mg/dL (ref 8.9–10.3)
Chloride: 106 mmol/L (ref 98–111)
Creatinine, Ser: <0.3 mg/dL — ABNORMAL LOW (ref 0.61–1.24)
Glucose, Bld: 97 mg/dL (ref 70–99)
Potassium: 3.6 mmol/L (ref 3.5–5.1)
Sodium: 140 mmol/L (ref 135–145)

## 2024-05-28 LAB — CBC WITH DIFFERENTIAL/PLATELET
Abs Immature Granulocytes: 0.03 K/uL (ref 0.00–0.07)
Basophils Absolute: 0 K/uL (ref 0.0–0.1)
Basophils Relative: 1 %
Eosinophils Absolute: 0.3 K/uL (ref 0.0–0.5)
Eosinophils Relative: 3 %
HCT: 30.2 % — ABNORMAL LOW (ref 39.0–52.0)
Hemoglobin: 9.2 g/dL — ABNORMAL LOW (ref 13.0–17.0)
Immature Granulocytes: 0 %
Lymphocytes Relative: 23 %
Lymphs Abs: 1.8 K/uL (ref 0.7–4.0)
MCH: 24.1 pg — ABNORMAL LOW (ref 26.0–34.0)
MCHC: 30.5 g/dL (ref 30.0–36.0)
MCV: 79.3 fL — ABNORMAL LOW (ref 80.0–100.0)
Monocytes Absolute: 0.5 K/uL (ref 0.1–1.0)
Monocytes Relative: 6 %
Neutro Abs: 5.3 K/uL (ref 1.7–7.7)
Neutrophils Relative %: 67 %
Platelets: 216 K/uL (ref 150–400)
RBC: 3.81 MIL/uL — ABNORMAL LOW (ref 4.22–5.81)
RDW: 18 % — ABNORMAL HIGH (ref 11.5–15.5)
WBC: 7.8 K/uL (ref 4.0–10.5)
nRBC: 0 % (ref 0.0–0.2)

## 2024-05-28 LAB — GLUCOSE, CAPILLARY
Glucose-Capillary: 94 mg/dL (ref 70–99)
Glucose-Capillary: 84 mg/dL (ref 70–99)

## 2024-05-28 MED ORDER — DIATRIZOATE MEGLUMINE & SODIUM 66-10 % PO SOLN
30.0000 mL | Freq: Once | ORAL | Status: AC
  Administered 2024-05-28: 30 mL
  Filled 2024-05-28: qty 30

## 2024-05-28 MED ORDER — PANTOPRAZOLE SODIUM 40 MG IV SOLR
40.0000 mg | Freq: Every day | INTRAVENOUS | Status: DC
  Administered 2024-05-28 – 2024-05-31 (×4): 40 mg via INTRAVENOUS
  Filled 2024-05-28 (×4): qty 10

## 2024-05-28 MED ORDER — ENOXAPARIN SODIUM 40 MG/0.4ML IJ SOSY
40.0000 mg | PREFILLED_SYRINGE | INTRAMUSCULAR | Status: DC
  Administered 2024-05-29 – 2024-06-01 (×4): 40 mg via SUBCUTANEOUS
  Filled 2024-05-28 (×4): qty 0.4

## 2024-05-28 MED ORDER — SCOPOLAMINE 1 MG/3DAYS TD PT72
1.0000 | MEDICATED_PATCH | TRANSDERMAL | Status: DC
  Administered 2024-05-29 – 2024-06-01 (×2): 1 mg via TRANSDERMAL
  Filled 2024-05-28 (×2): qty 1

## 2024-05-28 MED ORDER — ACETAMINOPHEN 650 MG RE SUPP: 650.0000 mg | Freq: Four times a day (QID) | RECTAL | Status: DC | PRN

## 2024-05-28 MED ORDER — SIMETHICONE 40 MG/0.6ML PO SUSP
80.0000 mg | Freq: Every day | ORAL | Status: DC
  Filled 2024-05-28: qty 1.2

## 2024-05-28 MED ORDER — SODIUM CHLORIDE 0.9% FLUSH
10.0000 mL | Freq: Two times a day (BID) | INTRAVENOUS | Status: DC
  Administered 2024-05-28 – 2024-06-01 (×7): 10 mL

## 2024-05-28 MED ORDER — SIMETHICONE 40 MG/0.6ML PO LIQD: 80.0000 mg | Freq: Every day | ORAL | Status: DC

## 2024-05-28 MED ORDER — POLYVINYL ALCOHOL 1.4 % OP SOLN
1.0000 [drp] | Freq: Every day | OPHTHALMIC | Status: DC | PRN
  Filled 2024-05-28: qty 15

## 2024-05-28 MED ORDER — GLYCOPYRROLATE 0.2 MG/ML IJ SOLN
1.0000 mg | Freq: Two times a day (BID) | INTRAMUSCULAR | Status: DC
  Administered 2024-05-28 – 2024-05-31 (×7): 1 mg via INTRAVENOUS
  Filled 2024-05-28 (×12): qty 5

## 2024-05-28 MED ORDER — POLYETHYLENE GLYCOL 3350 17 G PO PACK: 17.0000 g | PACK | Freq: Every day | ORAL | Status: DC | PRN

## 2024-05-28 MED ORDER — ENOXAPARIN SODIUM 40 MG/0.4ML IJ SOSY: 40.0000 mg | PREFILLED_SYRINGE | INTRAMUSCULAR | Status: DC

## 2024-05-28 MED ORDER — METOCLOPRAMIDE HCL 5 MG/5ML PO SOLN: 10.0000 mg | Freq: Three times a day (TID) | ORAL | Status: DC | PRN

## 2024-05-28 MED ORDER — SODIUM CHLORIDE 0.9% FLUSH: 10.0000 mL | INTRAVENOUS | Status: DC | PRN

## 2024-05-28 MED ORDER — IPRATROPIUM-ALBUTEROL 0.5-2.5 (3) MG/3ML IN SOLN
3.0000 mL | RESPIRATORY_TRACT | Status: DC | PRN
  Administered 2024-05-29 – 2024-05-30 (×3): 3 mL via RESPIRATORY_TRACT
  Filled 2024-05-28 (×4): qty 3

## 2024-05-28 MED ORDER — FREE WATER
125.0000 mL | Status: DC
  Administered 2024-05-29 – 2024-05-30 (×10): 125 mL

## 2024-05-28 MED ORDER — SODIUM CHLORIDE 0.9% FLUSH
3.0000 mL | Freq: Two times a day (BID) | INTRAVENOUS | Status: DC
  Administered 2024-05-28 – 2024-06-01 (×8): 3 mL via INTRAVENOUS

## 2024-05-28 MED ORDER — ACETAMINOPHEN 325 MG PO TABS: 650.0000 mg | ORAL_TABLET | Freq: Four times a day (QID) | ORAL | Status: DC | PRN

## 2024-05-28 MED ORDER — GLYCOPYRROLATE PEDIATRIC ORAL SYRINGE 0.2 MG/ML: 1000.0000 ug | Freq: Two times a day (BID) | ORAL | Status: DC

## 2024-05-28 MED ORDER — HYDROCODONE-ACETAMINOPHEN 5-325 MG PO TABS: 1.0000 | ORAL_TABLET | ORAL | Status: DC | PRN

## 2024-05-28 MED ORDER — MORPHINE SULFATE (PF) 2 MG/ML IV SOLN: 1.0000 mg | INTRAVENOUS | Status: DC | PRN

## 2024-05-28 MED ORDER — CHLORHEXIDINE GLUCONATE CLOTH 2 % EX PADS
6.0000 | MEDICATED_PAD | Freq: Every day | CUTANEOUS | Status: DC
  Administered 2024-05-28 – 2024-06-01 (×4): 6 via TOPICAL

## 2024-05-28 NOTE — Progress Notes (Signed)
 eLink Physician-Brief Progress Note Patient Name: Jerry [name] DOB: [birth year] MRN: [id number]   Date of Service  05/28/2024  HPI/Events of Note  53 yo ventilator dependent ALS patient admitted for a malfunctioning G-tube.  eICU Interventions  New Patient Evaluation.        Orvill Coulthard U Alyzza Andringa 05/28/2024, 7:51 PM

## 2024-05-28 NOTE — ED Triage Notes (Signed)
 Pt was BIB [facility] from home with complaints of a punctured gtube per his in home nurse who says it is leaking internally.  Vitals are stable en route.

## 2024-05-28 NOTE — ED Provider Notes (Addendum)
 [place] [facility] AT [facility] Provider Note   CSN: [id number] Arrival date & time: 05/28/24  1351     Patient presents with: G-Tube Problem   Jerry [name] is a 53 y.o. male.   HPI   53 y.o. male with medical history significant for advanced ALS with tracheostomy, ventilator dependence, PEG tube, and cholecystitis with cholecystostomy tube who was recently admitted for evaluation of malfunctioning GJ tube which was subsequently replaced presenting back with concern for GJ tube malfunction.  The patient's bedside nurse states that she thinks the tube not functioning properly.  Nursing here noted that the tube will not flush.  The patient was also scheduled to get his cholecystostomy drain removed tomorrow.  The patient via bedside communicator denies any pain at the moment.  Patient had felt that he had felt a balloon rupture at some point yesterday.  Prior to Admission medications   Medication Sig Start Date End Date Taking? Authorizing Provider  cetirizine  HCl (ZYRTEC ) 5 MG/5ML SOLN Take 10 mLs (10 mg total) by mouth at bedtime. Patient taking differently: Place 10 mLs into feeding tube at bedtime. 07/27/23   Sowles, Krichna, MD  glycopyrrolate  (CUVPOSA ) 0.2 mg/ml SOLN Place 5 mLs (1,000 mcg total) into feeding tube 2 (two) times daily. 04/08/24   Jillian Buttery, MD  ipratropium-albuterol  (DUONEB) 0.5-2.5 (3) MG/3ML SOLN USE 3 ML VIA NEBULIZER EVERY 4 HOURS AS NEEDED Patient taking differently: Inhale 3 mLs into the lungs every 4 (four) hours as needed (wheezing/sob). 11/04/23   Sowles, Krichna, MD  metoCLOPramide  (REGLAN ) 5 MG/5ML solution Place 10 mLs (10 mg total) into feeding tube every 8 (eight) hours as needed for nausea. 04/08/24   Adhikari, Amrit, MD  polyethylene glycol powder (GLYCOLAX /MIRALAX ) 17 GM/SCOOP powder Place 17 g into feeding tube daily. Titrate as needed for constipation Patient taking differently: Place 17 g into feeding tube daily as needed  for mild constipation or moderate constipation. 07/27/23   Sowles, Krichna, MD  Polyvinyl Alcohol -Povidone (CLEAR EYES NATURAL TEARS) 5-6 MG/ML SOLN Apply 1 drop to eye 3 (three) times daily as needed. Patient taking differently: Place 1 drop into both eyes daily as needed (dry eye). 07/27/23   Sowles, Krichna, MD  Potassium Chloride  40 MEQ/15ML (20%) SOLN TAKE 15 ML BY MOUTH TWICE DAILY Patient not taking: Reported on 04/06/2024 08/26/23   Sowles, Krichna, MD  pyridostigmine  (MESTINON ) 60 MG/5ML solution Place 2.5 mLs (30 mg total) into feeding tube 2 (two) times daily. Patient not taking: Reported on 04/06/2024 03/12/24   Gonfa, Taye T, MD  scopolamine  (TRANSDERM-SCOP) 1 MG/3DAYS PLACE 1 PATCH ONTO THE SKIN EVERY 3 DAYS 08/18/23   Sowles, Krichna, MD  Simethicone  40 MG/0.6ML LIQD 1.2 mLs (80 mg total) by Per J Tube route in the morning, at noon, in the evening, and at bedtime. Patient taking differently: Place 80 mg into feeding tube at bedtime. 03/12/24   Gonfa, Taye T, MD  sodium chloride  flush (NS) 0.9 % SOLN 10-40 mLs by Intracatheter route every 12 (twelve) hours. Patient taking differently: 10 mLs by Intracatheter route every 12 (twelve) hours. 03/12/24   Gonfa, Taye T, MD  Water  For Irrigation, Sterile (FREE WATER ) SOLN Place 125 mLs into feeding tube every 6 (six) hours. Patient taking differently: Place 125 mLs into feeding tube every 2 (two) hours. 03/12/24   Gonfa, Taye T, MD    Allergies: Georgianne cerise allergy]    Review of Systems  All other systems reviewed and are negative.   Updated  Vital Signs BP (!) 152/93   Pulse 81   Resp 18   Ht 5' 5 (1.651 m)   Wt 74.8 kg   SpO2 100%   BMI 27.46 kg/m   Physical Exam Vitals and nursing note reviewed.  Constitutional:      General: He is not in acute distress. HENT:     Head: Normocephalic and atraumatic.  Eyes:     Conjunctiva/sclera: Conjunctivae normal.     Pupils: Pupils are equal, round, and reactive to light.  Neck:      Comments: Tracheostomy in place, patient connected to his home ventilator Cardiovascular:     Rate and Rhythm: Normal rate and regular rhythm.  Pulmonary:     Effort: Pulmonary effort is normal. No respiratory distress.  Abdominal:     General: There is no distension.     Tenderness: There is no guarding.     Comments: GJ-tube in place, cholecystostomy tube in place  Musculoskeletal:        General: No deformity or signs of injury.     Cervical back: Neck supple.  Skin:    Findings: No lesion or rash.  Neurological:     General: No focal deficit present.     Mental Status: He is alert. Mental status is at baseline.     (all labs ordered are listed, but only abnormal results are displayed) Labs Reviewed  CBC WITH DIFFERENTIAL/PLATELET - Abnormal; Notable for the following components:      Result Value   RBC 3.81 (*)    Hemoglobin 9.2 (*)    HCT 30.2 (*)    MCV 79.3 (*)    MCH 24.1 (*)    RDW 18.0 (*)    All other components within normal limits  BASIC METABOLIC PANEL WITH GFR    EKG: None  Radiology: DG Chest Portable 1 View Result Date: 05/28/2024 EXAM: 1 VIEW(S) XRAY OF THE CHEST 05/28/2024 02:47:00 PM COMPARISON: 02/26/2024 CLINICAL HISTORY: Vent pt, ? JG tube malfunction FINDINGS: LINES, TUBES AND DEVICES: Tracheostomy tube in place with tip about 3.6 cm above the carina. Right chest wall dual-lumen port catheter in place with tip overlying right atrium. Left PICC removed. Incompletely visualized gastrojejunal tube in the upper abdomen LUNGS AND PLEURA: Low lung volumes. No focal pulmonary opacity. No pleural effusion. No pneumothorax. HEART AND MEDIASTINUM: No acute abnormality of the cardiac and mediastinal silhouettes. BONES AND SOFT TISSUES: No acute osseous abnormality. IMPRESSION: 1. No acute cardiopulmonary process. Electronically signed by: Luke Bun MD 05/28/2024 03:19 PM EST RP Workstation: [id number]     Procedures   Medications Ordered in the ED   pantoprazole  (PROTONIX ) injection 40 mg (has no administration in time range)  enoxaparin  (LOVENOX ) injection 40 mg (has no administration in time range)  sodium chloride  flush (NS) 0.9 % injection 3 mL (has no administration in time range)  acetaminophen  (TYLENOL ) tablet 650 mg (has no administration in time range)    Or  acetaminophen  (TYLENOL ) suppository 650 mg (has no administration in time range)  HYDROcodone -acetaminophen  (NORCO/VICODIN) 5-325 MG per tablet 1-2 tablet (has no administration in time range)  morphine  (PF) 2 MG/ML injection 1 mg (has no administration in time range)  glycopyrrolate  (CUVPOSA ) PEDS ORAL syringe 0.2 mg/mL (has no administration in time range)  ipratropium-albuterol  (DUONEB) 0.5-2.5 (3) MG/3ML nebulizer solution 3 mL (has no administration in time range)  metoCLOPramide  (REGLAN ) 5 MG/5ML solution 10 mg (has no administration in time range)  polyethylene glycol powder (GLYCOLAX /MIRALAX ) container 17 g (  has no administration in time range)  Polyvinyl Alcohol -Povidone 5-6 MG/ML SOLN 1 drop (has no administration in time range)  scopolamine  (TRANSDERM-SCOP) 1 MG/3DAYS 1 mg (has no administration in time range)  Simethicone  LIQD 80 mg (has no administration in time range)  sodium chloride  flush (NS) 0.9 % injection 10 mL (has no administration in time range)  free water  125 mL (has no administration in time range)  diatrizoate  meglumine -sodium (GASTROGRAFIN ) 66-10 % solution 30 mL (30 mLs Per Tube Given 05/28/24 1507)                                    Medical Decision Making Amount and/or Complexity of Data Reviewed Labs: ordered. Radiology: ordered.  Risk Prescription drug management. Decision regarding hospitalization.    53 y.o. male with medical history significant for advanced ALS with tracheostomy, ventilator dependence, PEG tube, and cholecystitis with cholecystostomy tube who was recently admitted for evaluation of malfunctioning GJ tube which  was subsequently replaced presenting back with concern for GJ tube malfunction.  The patient's bedside nurse states that she thinks the tube not functioning properly.  Nursing here noted that the tube will not flush.  The patient was also scheduled to get his cholecystostomy drain removed tomorrow.  The patient via bedside communicator denies any pain at the moment.  On arrival, the patient was vitally stable.  Presenting with concern for GJ tube malfunction.  Spoke with Dr. Johann of IR regarding the patient.  Will plan for GJ replacement inpatient, will also perform likely cholecystostomy tube removal.  Spoke with PCCM who will consult on the patient for ventilator management.  Hospitalist medicine consulted for admission, Dr. Trudy accepting.     Final diagnoses:  Malfunction of gastrostomy tube (HCC)  ALS (amyotrophic lateral sclerosis) (HCC)  Ventilator dependence [facility])    ED Discharge Orders     None          Jerrol Agent, MD 05/28/24 1714    Jerrol Agent, MD 05/28/24 1714

## 2024-05-28 NOTE — ED Notes (Addendum)
 Patient reports to RN that he felt the balloon rupture at some point yesterday. Patient's PEG tube is not flushing at this time; patient's RN at bedside reports that it was flushing before but appeared to have regurgitation into the tube, not flushing at all is a new change since this morning.

## 2024-05-28 NOTE — Consult Note (Signed)
 NAME:  Jerry [name], MRN:  [id number], DOB:  January 24, 1971, LOS: 0 ADMISSION DATE:  05/28/2024, CONSULTATION DATE:  05/28/24 REFERRING MD:  Jerrol CHIEF COMPLAINT:  Vent Management   History of Present Illness:  Jerry [name] is a 53 y.o. male who has a PMH as below including advanced ALS with trach/PEG dependence. He presented to [facility] ED 11/24 with malfunctioning GJ tube with concern of it being clogged. He had similar episode in October 2025 and required exchange of GJ tube by IR on 04/06/24. He has had no fevers/chills/sweats, N/V/D, abd pain. No purulence from GJ tube.  He is being admitted by [facility] and IR has been called for GJ tube malfunction and exchange. He also has a cholecystectomy tube that is scheduled to be exchanged 05/29/24 by IR.  PCCM was called for vent management of his chronic trach with home vent.  Pertinent  Medical History:  has ALS (amyotrophic lateral sclerosis) (HCC); Severe protein-calorie malnutrition; Microscopic hematuria; S/P percutaneous endoscopic gastrostomy (PEG) tube placement (HCC); Anxiety; Bed sore on buttock, right, unstageable (HCC); Pressure injury of skin; Autonomic dysfunction; Ventilator dependent (HCC); Tracheostomy dependent (HCC); Chronic respiratory failure with hypoxia (HCC); Failure to thrive in adult; Intermittent constipation; Primary thrombocytopenia (HCC); On total parenteral nutrition (TPN); On parenteral nutrition; Abnormal liver function tests; Sequelae of protein-energy malnutrition; Tetraplegia (HCC); Abdominal pain; Sepsis (HCC); Acute cholecystitis; Malfunction of jejunostomy tube (HCC); Normocytic anemia; Hypokalemia; and Ventilator dependence (HCC) on their problem list.  Significant Hospital Events: Including procedures, antibiotic start and stop dates in addition to other pertinent events   11/24 admit.  Interim History / Subjective:  Vitals stable. No concerns.  Objective:  Blood pressure (!) 152/93, pulse 81, resp. rate 18, height  5' 5 (1.651 m), weight 74.8 kg, SpO2 100%.    Vent Mode: AC Set Rate:  [16 bmp] 16 bmp Vt Set:  [500 mL] 500 mL PEEP:  [5 cmH20] 5 cmH20 Plateau Pressure:  [22 cmH20] 22 cmH20  No intake or output data in the 24 hours ending 05/28/24 1651 Filed Weights   05/28/24 1400  Weight: 74.8 kg     Physical Exam: General: Adult male, resting in bed, in NAD. Neuro: ALS, awake, interactive with computer communication system. HEENT: [place]/AT. Sclerae anicteric. Trach C/D/I. Cardiovascular: RRR, no M/R/G.  Lungs: Respirations even and unlabored.  CTA bilaterally, No W/R/R. Abdomen: GJ tube in place, cholecystectomy tube in place. BS x 4, soft, NT/ND.  Musculoskeletal: No gross deformities, no edema.   Assessment & Plan:   Chronic respiratory failure with tracheostomy and ventilator dependence - in setting of advanced ALS. - TRH to admit, OK for ICU bed given chronic vent needs. - Continue home vent at baseline settings. - Routine trach care. - CXR intermittently.  GJ tube malfunction/occlusion/clogging. - IR to evaluate for exchange.  Rest per primary team.   Labs   CBC: Recent Labs  Lab 05/28/24 1537  WBC 7.8  NEUTROABS 5.3  HGB 9.2*  HCT 30.2*  MCV 79.3*  PLT 216    Basic Metabolic Panel: No results for input(s): NA, K, CL, CO2, GLUCOSE, BUN, CREATININE, CALCIUM , MG, PHOS in the last 168 hours. GFR: CrCl cannot be calculated (Patient's most recent lab result is older than the maximum 21 days allowed.). Recent Labs  Lab 05/28/24 1537  WBC 7.8    Liver Function Tests: No results for input(s): AST, ALT, ALKPHOS, BILITOT, PROT, ALBUMIN in the last 168 hours. No results for input(s): LIPASE, AMYLASE in the last 168 hours.  No results for input(s): AMMONIA in the last 168 hours.  ABG    Component Value Date/Time   PHART 7.52 (H) 01/09/2022 1810   PCO2ART 26 (L) 01/09/2022 1810   PO2ART 66 (L) 01/09/2022 1810   HCO3 19.2 (L)  02/26/2024 0737   TCO2 20 (L) 02/26/2024 0737   ACIDBASEDEF 3.0 (H) 02/26/2024 0737   O2SAT 94 02/26/2024 0737     Coagulation Profile: No results for input(s): INR, PROTIME in the last 168 hours.  Cardiac Enzymes: No results for input(s): CKTOTAL, CKMB, CKMBINDEX, TROPONINI in the last 168 hours.  HbA1C: Hgb A1c MFr Bld  Date/Time Value Ref Range Status  02/26/2024 06:28 PM 5.2 4.8 - 5.6 % Final    Comment:    (NOTE) Diagnosis of Diabetes The following HbA1c ranges recommended by the American Diabetes Association (ADA) may be used as an aid in the diagnosis of diabetes mellitus.  Hemoglobin             Suggested A1C NGSP%              Diagnosis  <5.7                   Non Diabetic  5.7-6.4                Pre-Diabetic  >6.4                   Diabetic  <7.0                   Glycemic control for                       adults with diabetes.    12/09/2021 11:05 AM 5.4 4.8 - 5.6 % Final    Comment:    (NOTE) Pre diabetes:          5.7%-6.4%  Diabetes:              >6.4%  Glycemic control for   <7.0% adults with diabetes     CBG: No results for input(s): GLUCAP in the last 168 hours.  Review of Systems:   All negative; except for those that are bolded, which indicate positives.  Constitutional: weight loss, weight gain, night sweats, fevers, chills, fatigue, weakness.  HEENT: headaches, sore throat, sneezing, nasal congestion, post nasal drip, difficulty swallowing, tooth/dental problems, visual complaints, visual changes, ear aches. Neuro: difficulty with speech, weakness, numbness, ataxia. CV:  chest pain, orthopnea, PND, swelling in lower extremities, dizziness, palpitations, syncope.  Resp: cough, hemoptysis, dyspnea, wheezing. GI: heartburn, indigestion, abdominal pain, nausea, vomiting, diarrhea, constipation, change in bowel habits, loss of appetite, hematemesis, melena, hematochezia, malfunction of GJ tube. GU: dysuria, change in color of  urine, urgency or frequency, flank pain, hematuria. MSK: joint pain or swelling, decreased range of motion. Psych: change in mood or affect, depression, anxiety, suicidal ideations, homicidal ideations. Skin: rash, itching, bruising.   Past Medical History:  He,  has a past medical history of ALS (amyotrophic lateral sclerosis) (HCC), COVID-19 virus infection (06/2020), Meningitis (2003), Morbid obesity (HCC), Neuromuscular disorder (HCC), Prediabetes (11/05/2016), and Type 2 diabetes mellitus (HCC) (08/31/2023).   Surgical History:   Past Surgical History:  Procedure Laterality Date   IR CHOLANGIOGRAM EXISTING TUBE  02/29/2024   IR GJ TUBE CHANGE  03/01/2024   IR GJ TUBE CHANGE  04/06/2024   IR PERC CHOLECYSTOSTOMY  02/26/2024   IR REPLACE G-TUBE SIMPLE WO FLUORO  06/22/2022   MASS EXCISION Right 11/26/2014   Procedure: EXCISION MASS/GROIN EXCISION MASS;  Surgeon: Reyes LELON Cota, MD;  Location: [facility] ORS;  Service: General;  Laterality: Right;   MASS EXCISION Right 11/26/2014   Procedure: MINOR EXCISION OF MASS/POST THIGH MASS;  Surgeon: Reyes LELON Cota, MD;  Location: [facility] ORS;  Service: General;  Laterality: Right;   PEG PLACEMENT  10/27/2021   PORTACATH PLACEMENT Right 06/19/2019     Social History:   reports that he has never smoked. He has never used smokeless tobacco. He reports that he does not drink alcohol  and does not use drugs.   Family History:  His family history includes Diabetes in his brother, father, mother, sister, sister, and sister; Fibromyalgia in his sister; Hypertension in his sister.   Allergies Allergies  Allergen Reactions   Crab [Shellfish Allergy] Rash    Confirmed no allergy to fish with wife 02/28/24     Home Medications  Prior to Admission medications   Medication Sig Start Date End Date Taking? Authorizing Provider  cetirizine  HCl (ZYRTEC ) 5 MG/5ML SOLN Take 10 mLs (10 mg total) by mouth at bedtime. Patient taking differently: Place 10  mLs into feeding tube at bedtime. 07/27/23   Sowles, Krichna, MD  glycopyrrolate  (CUVPOSA ) 0.2 mg/ml SOLN Place 5 mLs (1,000 mcg total) into feeding tube 2 (two) times daily. 04/08/24   Jillian Buttery, MD  ipratropium-albuterol  (DUONEB) 0.5-2.5 (3) MG/3ML SOLN USE 3 ML VIA NEBULIZER EVERY 4 HOURS AS NEEDED Patient taking differently: Inhale 3 mLs into the lungs every 4 (four) hours as needed (wheezing/sob). 11/04/23   Sowles, Krichna, MD  metoCLOPramide  (REGLAN ) 5 MG/5ML solution Place 10 mLs (10 mg total) into feeding tube every 8 (eight) hours as needed for nausea. 04/08/24   Adhikari, Amrit, MD  polyethylene glycol powder (GLYCOLAX /MIRALAX ) 17 GM/SCOOP powder Place 17 g into feeding tube daily. Titrate as needed for constipation Patient taking differently: Place 17 g into feeding tube daily as needed for mild constipation or moderate constipation. 07/27/23   Sowles, Krichna, MD  Polyvinyl Alcohol -Povidone (CLEAR EYES NATURAL TEARS) 5-6 MG/ML SOLN Apply 1 drop to eye 3 (three) times daily as needed. Patient taking differently: Place 1 drop into both eyes daily as needed (dry eye). 07/27/23   Sowles, Krichna, MD  Potassium Chloride  40 MEQ/15ML (20%) SOLN TAKE 15 ML BY MOUTH TWICE DAILY Patient not taking: Reported on 04/06/2024 08/26/23   Sowles, Krichna, MD  pyridostigmine  (MESTINON ) 60 MG/5ML solution Place 2.5 mLs (30 mg total) into feeding tube 2 (two) times daily. Patient not taking: Reported on 04/06/2024 03/12/24   Gonfa, Taye T, MD  scopolamine  (TRANSDERM-SCOP) 1 MG/3DAYS PLACE 1 PATCH ONTO THE SKIN EVERY 3 DAYS 08/18/23   Sowles, Krichna, MD  Simethicone  40 MG/0.6ML LIQD 1.2 mLs (80 mg total) by Per J Tube route in the morning, at noon, in the evening, and at bedtime. Patient taking differently: Place 80 mg into feeding tube at bedtime. 03/12/24   Gonfa, Taye T, MD  sodium chloride  flush (NS) 0.9 % SOLN 10-40 mLs by Intracatheter route every 12 (twelve) hours. Patient taking differently: 10 mLs by  Intracatheter route every 12 (twelve) hours. 03/12/24   Gonfa, Taye T, MD  Water  For Irrigation, Sterile (FREE WATER ) SOLN Place 125 mLs into feeding tube every 6 (six) hours. Patient taking differently: Place 125 mLs into feeding tube every 2 (two) hours. 03/12/24   Gonfa, Taye T, MD      Sammi Gore, PA - C   Pulmonary & Critical Care Medicine For pager details, please see AMION or use Epic chat  After 1900, please call [facility] for cross coverage needs 05/28/2024, 4:51 PM

## 2024-05-28 NOTE — H&P (Addendum)
 History and Physical    Jerry [name] FMW:[id number] DOB: 12-Jul-1970 DOA: 05/28/2024  PCP: [facility] Patient coming from: home    Chief Complaint: GJ tube malfunction   HPI: 53 y/o M w/ PMH of ALS ventilator dependent, GJ tube, prediabetes, who presented w/ malfunction GJ tube malfunction. Hx was obtained via pt's home nurse at bedside and pt as he a computer that speaks what the he is thinking. Pt has has had several admissions for GJ tube malfunction with the tube being replaced on 04/06/24 as per pt's home nurse at bedside. Pt denies any fever, chills, sweating, chest pain, nausea, vomiting, abd pain, nausea, vomiting, dysuria, urinary urgency, urinary frequency, diarrhea or constipation. Of note, pt recently had cholecystitis in which a cholecystotomy tube was placed and it was suppose to be removed today. IR was consulted (Dr. Merrie) by the ER physician who was made aware of this as well and had plans to remove the cholecystostomy tube.   Review of Systems: As per HPI otherwise 14  point review of systems negative.    Past Medical History:  Diagnosis Date   ALS (amyotrophic lateral sclerosis) (HCC)    COVID-19 virus infection 06/2020   Meningitis 2003   spinal   Morbid obesity (HCC)    Neuromuscular disorder (HCC)    Prediabetes 11/05/2016   A1C 5.7 on 11/05/16   Type 2 diabetes mellitus (HCC) 08/31/2023    Past Surgical History:  Procedure Laterality Date   IR CHOLANGIOGRAM EXISTING TUBE  02/29/2024   IR GJ TUBE CHANGE  03/01/2024   IR GJ TUBE CHANGE  04/06/2024   IR PERC CHOLECYSTOSTOMY  02/26/2024   IR REPLACE G-TUBE SIMPLE WO FLUORO  06/22/2022   MASS EXCISION Right 11/26/2014   Procedure: EXCISION MASS/GROIN EXCISION MASS;  Surgeon: Reyes LELON Cota, MD;  Location: [facility] ORS;  Service: General;  Laterality: Right;   MASS EXCISION Right 11/26/2014   Procedure: MINOR EXCISION OF MASS/POST THIGH MASS;  Surgeon: Reyes LELON Cota, MD;  Location: [facility] ORS;   Service: General;  Laterality: Right;   PEG PLACEMENT  10/27/2021   PORTACATH PLACEMENT Right 06/19/2019     reports that he has never smoked. He has never used smokeless tobacco. He reports that he does not drink alcohol  and does not use drugs.  Allergies  Allergen Reactions   Crab [Shellfish Allergy] Rash    Confirmed no allergy to fish with wife 02/28/24    Family History  Problem Relation Age of Onset   Diabetes Father    Diabetes Mother    Diabetes Sister    Hypertension Sister    Diabetes Brother    Diabetes Sister    Fibromyalgia Sister    Diabetes Sister     Prior to Admission medications   Medication Sig Start Date End Date Taking? Authorizing Provider  cetirizine  HCl (ZYRTEC ) 5 MG/5ML SOLN Take 10 mLs (10 mg total) by mouth at bedtime. Patient taking differently: Place 10 mLs into feeding tube at bedtime. 07/27/23   Sowles, Krichna, MD  glycopyrrolate  (CUVPOSA ) 0.2 mg/ml SOLN Place 5 mLs (1,000 mcg total) into feeding tube 2 (two) times daily. 04/08/24   Adhikari, Amrit, MD  ipratropium-albuterol  (DUONEB) 0.5-2.5 (3) MG/3ML SOLN USE 3 ML VIA NEBULIZER EVERY 4 HOURS AS NEEDED Patient taking differently: Inhale 3 mLs into the lungs every 4 (four) hours as needed (wheezing/sob). 11/04/23   Sowles, Krichna, MD  metoCLOPramide  (REGLAN ) 5 MG/5ML solution Place 10 mLs (10 mg total) into feeding tube every 8 (  eight) hours as needed for nausea. 04/08/24   Adhikari, Amrit, MD  polyethylene glycol powder (GLYCOLAX /MIRALAX ) 17 GM/SCOOP powder Place 17 g into feeding tube daily. Titrate as needed for constipation Patient taking differently: Place 17 g into feeding tube daily as needed for mild constipation or moderate constipation. 07/27/23   Sowles, Krichna, MD  Polyvinyl Alcohol -Povidone (CLEAR EYES NATURAL TEARS) 5-6 MG/ML SOLN Apply 1 drop to eye 3 (three) times daily as needed. Patient taking differently: Place 1 drop into both eyes daily as needed (dry eye). 07/27/23   Sowles, Krichna,  MD  Potassium Chloride  40 MEQ/15ML (20%) SOLN TAKE 15 ML BY MOUTH TWICE DAILY Patient not taking: Reported on 04/06/2024 08/26/23   Sowles, Krichna, MD  pyridostigmine  (MESTINON ) 60 MG/5ML solution Place 2.5 mLs (30 mg total) into feeding tube 2 (two) times daily. Patient not taking: Reported on 04/06/2024 03/12/24   Gonfa, Taye T, MD  scopolamine  (TRANSDERM-SCOP) 1 MG/3DAYS PLACE 1 PATCH ONTO THE SKIN EVERY 3 DAYS 08/18/23   Sowles, Krichna, MD  Simethicone  40 MG/0.6ML LIQD 1.2 mLs (80 mg total) by Per J Tube route in the morning, at noon, in the evening, and at bedtime. Patient taking differently: Place 80 mg into feeding tube at bedtime. 03/12/24   Gonfa, Taye T, MD  sodium chloride  flush (NS) 0.9 % SOLN 10-40 mLs by Intracatheter route every 12 (twelve) hours. Patient taking differently: 10 mLs by Intracatheter route every 12 (twelve) hours. 03/12/24   Gonfa, Taye T, MD  Water  For Irrigation, Sterile (FREE WATER ) SOLN Place 125 mLs into feeding tube every 6 (six) hours. Patient taking differently: Place 125 mLs into feeding tube every 2 (two) hours. 03/12/24   Gonfa, Taye T, MD    Physical Exam: Vitals:   05/28/24 1355 05/28/24 1400  BP:  (!) 152/93  Pulse: 81 81  Resp: 18   SpO2: 100% 100%  Weight:  74.8 kg  Height:  5' 5 (1.651 m)    Constitutional: NAD, calm, comfortable. Trach w/ vent Vitals:   05/28/24 1355 05/28/24 1400  BP:  (!) 152/93  Pulse: 81 81  Resp: 18   SpO2: 100% 100%  Weight:  74.8 kg  Height:  5' 5 (1.651 m)   Eyes: PERRL, lids and conjunctivae normal ENMT: Mucous membranes are moist.  Neck: normal, supple Respiratory: clear to auscultation bilaterally, no wheezing, no crackles. Normal respiratory effort.  Cardiovascular: S1/S2+. No gallops or rubs No extremity edema. Abdomen: soft, no tenderness, distended, hypoactive bowel sounds Musculoskeletal: no cyanosis. Poor muscle tone.  Skin: no rashes, lesions, ulcers.  Neurologic: Awake, alert & oriented. Decreased  strength in all 4 extremities  Psychiatric: Judgement and insight at baseline. Appropriate mood and affect   Labs on Admission: I have personally reviewed following labs and imaging studies  CBC: Recent Labs  Lab 05/28/24 1537  WBC 7.8  NEUTROABS 5.3  HGB 9.2*  HCT 30.2*  MCV 79.3*  PLT 216   Basic Metabolic Panel: No results for input(s): NA, K, CL, CO2, GLUCOSE, BUN, CREATININE, CALCIUM , MG, PHOS in the last 168 hours. GFR: CrCl cannot be calculated (Patient's most recent lab result is older than the maximum 21 days allowed.). Liver Function Tests: No results for input(s): AST, ALT, ALKPHOS, BILITOT, PROT, ALBUMIN in the last 168 hours. No results for input(s): LIPASE, AMYLASE in the last 168 hours. No results for input(s): AMMONIA in the last 168 hours. Coagulation Profile: No results for input(s): INR, PROTIME in the last 168 hours. Cardiac Enzymes:  No results for input(s): CKTOTAL, CKMB, CKMBINDEX, TROPONINI in the last 168 hours. BNP (last 3 results) No results for input(s): PROBNP in the last 8760 hours. HbA1C: No results for input(s): HGBA1C in the last 72 hours. CBG: No results for input(s): GLUCAP in the last 168 hours. Lipid Profile: No results for input(s): CHOL, HDL, LDLCALC, TRIG, CHOLHDL, LDLDIRECT in the last 72 hours. Thyroid Function Tests: No results for input(s): TSH, T4TOTAL, FREET4, T3FREE, THYROIDAB in the last 72 hours. Anemia Panel: No results for input(s): VITAMINB12, FOLATE, FERRITIN, TIBC, IRON, RETICCTPCT in the last 72 hours. Urine analysis:    Component Value Date/Time   COLORURINE AMBER (A) 02/26/2024 0343   APPEARANCEUR HAZY (A) 02/26/2024 0343   LABSPEC >1.046 (H) 02/26/2024 0343   PHURINE 7.0 02/26/2024 0343   GLUCOSEU 50 (A) 02/26/2024 0343   HGBUR NEGATIVE 02/26/2024 0343   BILIRUBINUR NEGATIVE 02/26/2024 0343   KETONESUR 5 (A) 02/26/2024  0343   PROTEINUR 30 (A) 02/26/2024 0343   NITRITE NEGATIVE 02/26/2024 0343   LEUKOCYTESUR MODERATE (A) 02/26/2024 0343    Radiological Exams on Admission: DG Chest Portable 1 View Result Date: 05/28/2024 EXAM: 1 VIEW(S) XRAY OF THE CHEST 05/28/2024 02:47:00 PM COMPARISON: 02/26/2024 CLINICAL HISTORY: Vent pt, ? JG tube malfunction FINDINGS: LINES, TUBES AND DEVICES: Tracheostomy tube in place with tip about 3.6 cm above the carina. Right chest wall dual-lumen port catheter in place with tip overlying right atrium. Left PICC removed. Incompletely visualized gastrojejunal tube in the upper abdomen LUNGS AND PLEURA: Low lung volumes. No focal pulmonary opacity. No pleural effusion. No pneumothorax. HEART AND MEDIASTINUM: No acute abnormality of the cardiac and mediastinal silhouettes. BONES AND SOFT TISSUES: No acute osseous abnormality. IMPRESSION: 1. No acute cardiopulmonary process. Electronically signed by: Luke Bun MD 05/28/2024 03:19 PM EST RP Workstation: [id number]    EKG: Independently reviewed.   Assessment/Plan Principal Problem:   ALS (amyotrophic lateral sclerosis) (HCC)  GJ tube malfunction: NPO and nothing per tube. IR consulted (Dr. Merrie) by ER physician. Multiple episodes of malfunction on GJ tube and was recently replaced on 04/06/24. XR abd ordered as abd is distended   ALS: ventilator dependent. Stable. ICU consulted by ER physician for vent management only  Recent cholecystitis: s/p cholecystostomy tube and the tube was suppose to be removed today. IR will remove tube and is aware as per ER physician   Prediabetes: has tube feeds but hold b/c of GJ tube malfunction. Will check BS q4hrs while nothing per tube    Microcytic anemia: will check iron panel in AM. No need for a transfusion currently   DVT prophylaxis: lovenox   Code Status: Full (as per pt)  Family Communication: discussed pt's care w/ pt's wife and answered her questions. Pt's wife wants to discuss the  type of GJ tube to be placed w/ IR  Disposition Plan: likely d/c back home Consults called: IR (Dr. Merrie) Admission status: inpatient    Anthony CHRISTELLA Pouch MD [facility]  If 7PM-7AM, please contact night-coverage [email or web address] Password [email or web address]  05/28/2024, 5:02 PM

## 2024-05-28 NOTE — Progress Notes (Signed)
 RT assisted RN with transporting of patient on home ventilator from ED room 41 to 2M07 without complications.

## 2024-05-28 NOTE — Progress Notes (Signed)
 [facility] Liaison Note:   This is a current patient with [facility], followed for outpatient palliative care.    Liaisons will continue to follow for discharge disposition.   Please call with any hospice or outpatient palliative questions or concerns.   Thank you, Eleanor Nail, [facility] liaison  [phone]

## 2024-05-29 ENCOUNTER — Inpatient Hospital Stay (HOSPITAL_COMMUNITY)

## 2024-05-29 ENCOUNTER — Inpatient Hospital Stay (HOSPITAL_COMMUNITY)
Admission: RE | Admit: 2024-05-29 | Discharge: 2024-05-29 | Disposition: A | Discharge status: Home / Self Care | Source: Ambulatory Visit | Attending: Student | Admitting: Student

## 2024-05-29 DIAGNOSIS — G1221 Amyotrophic lateral sclerosis: Secondary | ICD-10-CM | POA: Diagnosis not present

## 2024-05-29 HISTORY — PX: IR GJ TUBE CHANGE: IMG1440

## 2024-05-29 HISTORY — PX: IR CHOLANGIOGRAM EXISTING TUBE: IMG6040

## 2024-05-29 LAB — CBC
HCT: 31.9 % — ABNORMAL LOW (ref 39.0–52.0)
Hemoglobin: 9.8 g/dL — ABNORMAL LOW (ref 13.0–17.0)
MCH: 23.9 pg — ABNORMAL LOW (ref 26.0–34.0)
MCHC: 30.7 g/dL (ref 30.0–36.0)
MCV: 77.8 fL — ABNORMAL LOW (ref 80.0–100.0)
Platelets: 273 K/uL (ref 150–400)
RBC: 4.1 MIL/uL — ABNORMAL LOW (ref 4.22–5.81)
RDW: 18 % — ABNORMAL HIGH (ref 11.5–15.5)
WBC: 7.6 K/uL (ref 4.0–10.5)
nRBC: 0 % (ref 0.0–0.2)

## 2024-05-29 LAB — GLUCOSE, CAPILLARY
Glucose-Capillary: 79 mg/dL (ref 70–99)
Glucose-Capillary: 72 mg/dL (ref 70–99)
Glucose-Capillary: 63 mg/dL — ABNORMAL LOW (ref 70–99)
Glucose-Capillary: 81 mg/dL (ref 70–99)
Glucose-Capillary: 86 mg/dL (ref 70–99)
Glucose-Capillary: 110 mg/dL — ABNORMAL HIGH (ref 70–99)

## 2024-05-29 LAB — IRON AND TIBC
Iron: 42 ug/dL — ABNORMAL LOW (ref 45–182)
Saturation Ratios: 14 % — ABNORMAL LOW (ref 17.9–39.5)
TIBC: 312 ug/dL (ref 250–450)
UIBC: 270 ug/dL

## 2024-05-29 LAB — BASIC METABOLIC PANEL WITH GFR
Anion gap: 13 (ref 5–15)
BUN: 8 mg/dL (ref 6–20)
CO2: 18 mmol/L — ABNORMAL LOW (ref 22–32)
Calcium: 9.9 mg/dL (ref 8.9–10.3)
Chloride: 110 mmol/L (ref 98–111)
Creatinine, Ser: <0.3 mg/dL — ABNORMAL LOW (ref 0.61–1.24)
Glucose, Bld: 76 mg/dL (ref 70–99)
Potassium: 3.3 mmol/L — ABNORMAL LOW (ref 3.5–5.1)
Sodium: 141 mmol/L (ref 135–145)

## 2024-05-29 LAB — FERRITIN: Ferritin: 81 ng/mL (ref 24–336)

## 2024-05-29 LAB — MRSA NEXT GEN BY PCR, NASAL: MRSA by PCR Next Gen: NOT DETECTED

## 2024-05-29 MED ORDER — OSMOLITE 1.2 CAL PO LIQD
1000.0000 mL | ORAL | Status: DC
  Administered 2024-05-29 – 2024-05-31 (×2): 1000 mL
  Filled 2024-05-29 (×5): qty 1000

## 2024-05-29 MED ORDER — LIDOCAINE VISCOUS HCL 2 % MT SOLN
15.0000 mL | Freq: Once | OROMUCOSAL | Status: AC
  Administered 2024-05-29: 4 mL via OROMUCOSAL
  Filled 2024-05-29: qty 15

## 2024-05-29 MED ORDER — LIDOCAINE VISCOUS HCL 2 % MT SOLN
OROMUCOSAL | Status: AC
  Filled 2024-05-29: qty 15

## 2024-05-29 MED ORDER — LIDOCAINE-EPINEPHRINE 1 %-1:100000 IJ SOLN
INTRAMUSCULAR | Status: AC
  Filled 2024-05-29: qty 1

## 2024-05-29 MED ORDER — LIDOCAINE-EPINEPHRINE 1 %-1:100000 IJ SOLN
20.0000 mL | Freq: Once | INTRAMUSCULAR | Status: DC
Start: 2024-05-29 — End: 2024-06-01

## 2024-05-29 MED ORDER — IOHEXOL 300 MG/ML  SOLN
100.0000 mL | Freq: Once | INTRAMUSCULAR | Status: AC | PRN
  Administered 2024-05-29: 55 mL

## 2024-05-29 MED ORDER — DEXTROSE 50 % IV SOLN
1.0000 | Freq: Once | INTRAVENOUS | Status: AC
  Administered 2024-05-29: 50 mL via INTRAVENOUS
  Filled 2024-05-29: qty 50

## 2024-05-29 MED ORDER — LIDOCAINE VISCOUS HCL 2 % MT SOLN
OROMUCOSAL | Status: AC
Start: 2024-05-29 — End: 2024-05-29
  Filled 2024-05-29: qty 15

## 2024-05-29 NOTE — Progress Notes (Signed)
 Brief Nutrition Support Note  Consult received for enteral/tube feeding initiation and management following a GJ-tube exchange in IR.  Adult Enteral Nutrition Protocol initiated but adjusted rate to pt's home regimen. Pt well known to nutrition department from previous admissions. Full assessment to follow.  GJ tube in place confirmed per xray imaging.   Admitting Dx: ALS (amyotrophic lateral sclerosis) (HCC) [G12.21] Ventilator dependence (HCC) [Z99.11] Malfunction of gastrostomy tube (HCC) [K94.23]  Body mass index is 27.46 kg/m. Pt meets criteria for overweight based on current BMI.  Labs: Recent Labs  Lab 05/28/24 2026 05/29/24 0421  NA 140 141  K 3.6 3.3*  CL 106 110  CO2 21* 18*  BUN 9 8  CREATININE <0.30* <0.30*  CALCIUM  10.2 9.9  GLUCOSE 97 76    Vernell Lukes, RD, LDN, CNSC Registered Dietitian II Please reach out via secure chat

## 2024-05-29 NOTE — Progress Notes (Signed)
 Patient's wife aware of today's cholangiogram results and drain capping trial. She initially requested the drain to be removed but then agreed for capping trial while patient remains hospitalized. If patient does not develop fevers, leukocytosis or other signs of cholecystitis the plan is to remove the drain prior to hospital discharge.   Please contact IR when a discharge plan is in place.  Warren Dais, AGACNP-BC 05/29/2024, 2:51 PM

## 2024-05-29 NOTE — Care Management Obs Status (Signed)
 MEDICARE OBSERVATION STATUS NOTIFICATION   Patient Details  Name: Jerry [name] MRN: [id number] Date of Birth: 1970/07/10   Medicare Observation Status Notification Given:  Other (see comment) (place in mail for wife)    Rosalva Jon Bloch, RN 05/29/2024, 11:55 AM

## 2024-05-29 NOTE — Procedures (Signed)
 Interventional Radiology Procedure Note  Procedure:  1) Cholecystogram 2) Gastrojejunostomy tube exchange  Findings: Please refer to procedural dictation for full description. 24 Fr GJ tube replaced.  Cholecystogram demonstrating irregular lumen of gallbladder with patent appearing cystic and common bile ducts.  Indwelling cholecystostomy tube capped.  Complications: None immediate  Estimated Blood Loss: None  Recommendations: GJ ready for immediate use. Keep cholecystotomy capped unless signs/symptoms of acute cholecystitis develop, in which case place back to bag drainage. IR will evaluate for possible removal tomorrow if capping is tolerated well.   Ester Sides, MD

## 2024-05-29 NOTE — TOC Initial Note (Addendum)
 Transition of Care [facility]) - Initial/Assessment Note    Patient Details  Name: Jerry [name] MRN: [id number] Date of Birth: 1970/07/20  Transition of Care [facility] CM/SW Contact:    Lauraine FORBES Saa, LCSWA Phone Number: 05/29/2024, 12:58 PM  Clinical Narrative:                  12:58 PM Per chart review, patient resides at home with spouse. Patient has a PCP through [employer] and insurance. Patient is currently active with [facility] for RN and [facility] Aide needs. Patient is currently active with [facility] for outpatient palliative care needs. Patient has DME (hospital bed, hoyer lift, nebulizer, trach supplies, oxygen (trilogy), wheelchair, tube feeds) history with [facility] and [facility]. Patient does not have SNF history. Patient's preferred pharmacy's are [facility], [facility], and [place] [id number] [address]. TOC will continue to follow.  Expected Discharge Plan: Home w Home Health Services Barriers to Discharge: Continued Medical Work up   Patient Goals and CMS Choice            Expected Discharge Plan and Services   Discharge Planning Services: CM Consult Post Acute Care Choice: Home Health Living arrangements for the past 2 months: Single Family Home                                      Prior Living Arrangements/Services Living arrangements for the past 2 months: Single Family Home Lives with:: Spouse Patient language and need for interpreter reviewed:: Yes        Need for Family Participation in Patient Care: Yes (Comment) Care giver support system in place?: Yes (comment) Current home services: DME, Home RN, Homehealth aide Criminal Activity/Legal Involvement Pertinent to Current Situation/Hospitalization: No - Comment as needed  Activities of Daily Living      Permission Sought/Granted Permission sought to share information with : Family Supports, Oceanographer  granted to share information with : No (Contact information on chart)  Share Information with NAME: Doristine Pines  Permission granted to share info w AGENCY: [facility]  Permission granted to share info w Relationship: Spouse  Permission granted to share info w Contact Information: [phone]  Emotional Assessment         Alcohol  / Substance Use: Not Applicable Psych Involvement: No (comment)  Admission diagnosis:  ALS (amyotrophic lateral sclerosis) (HCC) [G12.21] Ventilator dependence (HCC) [Z99.11] Malfunction of gastrostomy tube (HCC) [K94.23] Patient Active Problem List   Diagnosis Date Noted   Malfunction of gastrostomy tube (HCC) 05/28/2024   Malfunction of jejunostomy tube (HCC) 04/06/2024   Normocytic anemia 04/06/2024   Hypokalemia 04/06/2024   Ventilator dependence (HCC) 04/06/2024   Abdominal pain 02/26/2024   Sepsis (HCC) 02/26/2024   Acute cholecystitis 02/26/2024   Abnormal liver function tests 01/24/2024   Sequelae of protein-energy malnutrition 01/24/2024   Tetraplegia (HCC) 01/24/2024   On parenteral nutrition 08/31/2023   Failure to thrive in adult 07/27/2023   Intermittent constipation 07/27/2023   On total parenteral nutrition (TPN) 07/27/2023   Primary thrombocytopenia (HCC) 09/25/2022   Chronic respiratory failure with hypoxia (HCC) 06/22/2022   Autonomic dysfunction 01/06/2022   Ventilator dependent (HCC) 01/06/2022   Tracheostomy dependent (HCC) 01/06/2022   Pressure injury of skin 12/27/2021   Anxiety 11/18/2021   Bed sore on buttock, right, unstageable (HCC) 11/18/2021   Severe protein-calorie malnutrition 11/13/2021   Microscopic hematuria  10/29/2021   S/P percutaneous endoscopic gastrostomy (PEG) tube placement (HCC) 10/29/2021   ALS (amyotrophic lateral sclerosis) (HCC) 01/11/2019   PCP:  Franco, [facility] Pharmacy:   [id number] [place], [place] - [phone] AT [facility] OF [facility] & [address] [place] [address] Phone: [phone] Fax: [phone]  [facility] [address] [place] [address] Phone: [phone] Fax: [phone]  [facility] [address] [place] [address] Phone: [phone] Fax: [phone]     Social Drivers of Health (SDOH) Social History: SDOH Screenings   Food Insecurity: No Food Insecurity (05/29/2024)  Housing: Low Risk  (05/29/2024)  Transportation Needs: No Transportation Needs (05/29/2024)  Utilities: Not At Risk (05/29/2024)  Alcohol  Screen: Low Risk  (09/04/2020)  Depression (PHQ2-9): Low Risk  (01/24/2024)  Financial Resource Strain: Low Risk  (09/29/2022)  Physical Activity: Inactive (10/11/2023)  Social Connections: Unknown (10/11/2023)  Stress: Stress Concern Present (10/11/2023)  Tobacco Use: Low Risk  (05/28/2024)  Health Literacy: Adequate Health Literacy (10/11/2023)   SDOH Interventions:     Readmission Risk Interventions    03/12/2024    4:10 PM  Readmission Risk Prevention Plan  Transportation Screening Complete  PCP or Specialist Appt within 5-7 Days Complete  Home Care Screening Complete  Medication Review (RN CM) Complete

## 2024-05-29 NOTE — Care Management CC44 (Signed)
 Condition Code 44 Documentation Completed  Patient Details  Name: Jerry [name] MRN: [id number] Date of Birth: 17-Dec-1970   Condition Code 44 given:  Yes Patient signature on Condition Code 44 notice:  no, pt trach/ ventilator Documentation of 2 MD's agreement:  Yes Code 44 added to claim:  Yes    Rosalva Jon Bloch, RN 05/29/2024, 11:56 AM

## 2024-05-29 NOTE — Progress Notes (Addendum)
 PROGRESS NOTE    Jerry [name]  FMW:[id number] DOB: 03/20/1971 DOA: 05/28/2024 PCP: [facility]   Brief Narrative:   53 y/o M w/ PMH of ALS ventilator dependent, GJ tube, prediabetes, who presented w/ malfunction GJ tube malfunction.  Pt has has had several admissions for GJ tube malfunction with the tube being replaced on 04/06/24.  IR consulted for GJ tube replacement. He lives at home with his wife.  Assessment & Plan:  Principal Problem:   ALS (amyotrophic lateral sclerosis) (HCC) Active Problems:   Malfunction of gastrostomy tube (HCC)   GJ tube malfunction: IR consulted. 24 Fr GJ tube replaced on 11/25. Multiple episodes of malfunction on GJ tube and was recently replaced on 04/06/24.  XR abd showed possible ileus Tube feeds resumed after GJ tube replacement.   ALS: ventilator dependent. Stable.   VDRF: chronic, in the setting of ALS. PCCM on board. Patient is on his home vent.   H/o cholecystitis: cholecystostomy tube was placed by IR on 02/26/24 and the tube was supposed to be removed on 11/24.  IR did cholecystogram which showed irregular lumen of gallbladder with patent appearing cystic and common bile ducts. Indwelling cholecystostomy tube capped.  If patient does not develop fevers, leukocytosis or other signs of cholecystitis the plan is to remove the drain prior to hospital discharge.      Prediabetes: has tube feeds but hold b/c of GJ tube malfunction.    Microcytic anemia:  Iron panel showed iron deficiency anemia.  Disposition: Lives at home with his wife. He is currently active with [facility] for RN and [facility] Aide needs     DVT prophylaxis: enoxaparin  (LOVENOX ) injection 40 mg Start: 05/29/24 0000 SCDs Start: 05/28/24 1649     Code Status: Full Code Family Communication:  Mother is at the bedside Status is: Observation The patient remains OBS appropriate and will d/c before 2 midnights.    Subjective:    Examination:  General exam:  Appears calm and comfortable, Trach in place, connected to home ventilator Respiratory system: Clear to auscultation. Respiratory effort normal. Cardiovascular system: S1 & S2 heard, RRR. No JVD, murmurs, rubs, gallops or clicks. No pedal edema. Gastrointestinal system: Abdomen is distended with diminished bowel sounds Central nervous system: Alert and alert, mentation is at baseline, interacts through computer, has ALS       Diet Orders (From admission, onward)     Start     Ordered   05/28/24 1657  Diet NPO time specified  Diet effective now        05/28/24 1657            Objective: Vitals:   05/29/24 0800 05/29/24 0802 05/29/24 0900 05/29/24 1000  BP: 124/84  108/76 121/83  Pulse: 95  87 88  Resp: 16  16 16   Temp:  98.8 F (37.1 C)    TempSrc:  Oral    SpO2: 100%  100% 100%  Weight:      Height:        Intake/Output Summary (Last 24 hours) at 05/29/2024 1100 Last data filed at 05/28/2024 2133 Gross per 24 hour  Intake 10 ml  Output 100 ml  Net -90 ml   Filed Weights   05/28/24 1400  Weight: 74.8 kg    Scheduled Meds:  Chlorhexidine  Gluconate Cloth  6 each Topical Daily   enoxaparin  (LOVENOX ) injection  40 mg Subcutaneous Q24H   free water   125 mL Per Tube Q2H   glycopyrrolate   1 mg Intravenous  BID   lidocaine   15 mL Mouth/Throat Once   lidocaine -EPINEPHrine   20 mL Intradermal Once   pantoprazole  (PROTONIX ) IV  40 mg Intravenous Daily   scopolamine   1 patch Transdermal Q72H   sodium chloride  flush  10 mL Intracatheter Q12H   sodium chloride  flush  3 mL Intravenous Q12H   Continuous Infusions:  Nutritional status     Body mass index is 27.46 kg/m.  Data Reviewed:   CBC: Recent Labs  Lab 05/28/24 1537 05/29/24 0421  WBC 7.8 7.6  NEUTROABS 5.3  --   HGB 9.2* 9.8*  HCT 30.2* 31.9*  MCV 79.3* 77.8*  PLT 216 273   Basic Metabolic Panel: Recent Labs  Lab 05/28/24 2026 05/29/24 0421  NA 140 141  K 3.6 3.3*  CL 106 110  CO2 21* 18*   GLUCOSE 97 76  BUN 9 8  CREATININE <0.30* <0.30*  CALCIUM  10.2 9.9   GFR: CrCl cannot be calculated (This lab value cannot be used to calculate CrCl because it is not a number: <0.30). Liver Function Tests: No results for input(s): AST, ALT, ALKPHOS, BILITOT, PROT, ALBUMIN in the last 168 hours. No results for input(s): LIPASE, AMYLASE in the last 168 hours. No results for input(s): AMMONIA in the last 168 hours. Coagulation Profile: No results for input(s): INR, PROTIME in the last 168 hours. Cardiac Enzymes: No results for input(s): CKTOTAL, CKMB, CKMBINDEX, TROPONINI in the last 168 hours. BNP (last 3 results) No results for input(s): PROBNP in the last 8760 hours. HbA1C: No results for input(s): HGBA1C in the last 72 hours. CBG: Recent Labs  Lab 05/28/24 2012 05/28/24 2325 05/29/24 0332 05/29/24 0737  GLUCAP 94 84 79 72   Lipid Profile: No results for input(s): CHOL, HDL, LDLCALC, TRIG, CHOLHDL, LDLDIRECT in the last 72 hours. Thyroid Function Tests: No results for input(s): TSH, T4TOTAL, FREET4, T3FREE, THYROIDAB in the last 72 hours. Anemia Panel: Recent Labs    05/29/24 0421  FERRITIN 81  TIBC 312  IRON 42*   Sepsis Labs: No results for input(s): PROCALCITON, LATICACIDVEN in the last 168 hours.  Recent Results (from the past 240 hours)  MRSA Next Gen by PCR, Nasal     Status: None   Collection Time: 05/29/24  4:11 AM   Specimen: Nasal Mucosa; Nasal Swab  Result Value Ref Range Status   MRSA by PCR Next Gen NOT DETECTED NOT DETECTED Final    Comment: (NOTE) The GeneXpert MRSA Assay (FDA approved for NASAL specimens only), is one component of a comprehensive MRSA colonization surveillance program. It is not intended to diagnose MRSA infection nor to guide or monitor treatment for MRSA infections. Test performance is not FDA approved in patients less than 1 years old. Performed at [facility], [address] [place], [place] [address]          Radiology Studies: DG Abd Portable 1V Result Date: 05/28/2024 EXAM: 1 VIEW XRAY OF THE ABDOMEN 05/28/2024 07:32:00 PM COMPARISON: 04/06/2024 CLINICAL HISTORY: Abdominal distention FINDINGS: LINES, TUBES AND DEVICES: Percutaneous pigtail catheter in right upper quadrant. Gastrojejunostomy tube with tip in distal duodenum. BOWEL: Scattered large and small bowel gas is noted without true obstructive change. This may represent an ileus. SOFT TISSUES: No opaque urinary calculi. BONES: No acute osseous abnormality. IMPRESSION: 1. Scattered large and small bowel gas without true obstructive change, possibly representing an ileus. Electronically signed by: Oneil Devonshire MD 05/28/2024 09:59 PM EST RP Workstation: [id number]   DG Chest Portable  1 View Result Date: 05/28/2024 EXAM: 1 VIEW(S) XRAY OF THE CHEST 05/28/2024 02:47:00 PM COMPARISON: 02/26/2024 CLINICAL HISTORY: Vent pt, ? JG tube malfunction FINDINGS: LINES, TUBES AND DEVICES: Tracheostomy tube in place with tip about 3.6 cm above the carina. Right chest wall dual-lumen port catheter in place with tip overlying right atrium. Left PICC removed. Incompletely visualized gastrojejunal tube in the upper abdomen LUNGS AND PLEURA: Low lung volumes. No focal pulmonary opacity. No pleural effusion. No pneumothorax. HEART AND MEDIASTINUM: No acute abnormality of the cardiac and mediastinal silhouettes. BONES AND SOFT TISSUES: No acute osseous abnormality. IMPRESSION: 1. No acute cardiopulmonary process. Electronically signed by: Luke Bun MD 05/28/2024 03:19 PM EST RP Workstation: [id number]           LOS: 1 day   Time spent= 35 mins    Deliliah Room, MD [facility]  If 7PM-7AM, please contact night-coverage  05/29/2024, 11:00 AM

## 2024-05-29 NOTE — Progress Notes (Addendum)
 Pt transported from 2M07 to IR and back on hospital vent by RN and RT w/o complications.

## 2024-05-29 NOTE — Plan of Care (Signed)
  Problem: Clinical Measurements: Goal: Will remain free from infection Outcome: Progressing   Problem: Coping: Goal: Level of anxiety will decrease Outcome: Progressing   

## 2024-05-30 DIAGNOSIS — K9423 Gastrostomy malfunction: Secondary | ICD-10-CM | POA: Diagnosis present

## 2024-05-30 DIAGNOSIS — G1221 Amyotrophic lateral sclerosis: Secondary | ICD-10-CM | POA: Diagnosis not present

## 2024-05-30 LAB — CBC
HCT: 31.7 % — ABNORMAL LOW (ref 39.0–52.0)
Hemoglobin: 9.7 g/dL — ABNORMAL LOW (ref 13.0–17.0)
MCH: 24.2 pg — ABNORMAL LOW (ref 26.0–34.0)
MCHC: 30.6 g/dL (ref 30.0–36.0)
MCV: 79.1 fL — ABNORMAL LOW (ref 80.0–100.0)
Platelets: 262 K/uL (ref 150–400)
RBC: 4.01 MIL/uL — ABNORMAL LOW (ref 4.22–5.81)
RDW: 17.9 % — ABNORMAL HIGH (ref 11.5–15.5)
WBC: 6.1 K/uL (ref 4.0–10.5)
nRBC: 0 % (ref 0.0–0.2)

## 2024-05-30 LAB — BASIC METABOLIC PANEL WITH GFR
Anion gap: 11 (ref 5–15)
BUN: 8 mg/dL (ref 6–20)
CO2: 18 mmol/L — ABNORMAL LOW (ref 22–32)
Calcium: 9.3 mg/dL (ref 8.9–10.3)
Chloride: 109 mmol/L (ref 98–111)
Creatinine, Ser: <0.3 mg/dL — ABNORMAL LOW (ref 0.61–1.24)
Glucose, Bld: 158 mg/dL — ABNORMAL HIGH (ref 70–99)
Potassium: 3.5 mmol/L (ref 3.5–5.1)
Sodium: 138 mmol/L (ref 135–145)

## 2024-05-30 LAB — GLUCOSE, CAPILLARY
Glucose-Capillary: 117 mg/dL — ABNORMAL HIGH (ref 70–99)
Glucose-Capillary: 135 mg/dL — ABNORMAL HIGH (ref 70–99)
Glucose-Capillary: 142 mg/dL — ABNORMAL HIGH (ref 70–99)
Glucose-Capillary: 142 mg/dL — ABNORMAL HIGH (ref 70–99)
Glucose-Capillary: 157 mg/dL — ABNORMAL HIGH (ref 70–99)
Glucose-Capillary: 162 mg/dL — ABNORMAL HIGH (ref 70–99)

## 2024-05-30 MED ORDER — ALTEPLASE 2 MG IJ SOLR
2.0000 mg | Freq: Once | INTRAMUSCULAR | Status: AC
  Administered 2024-05-30: 2 mg
  Filled 2024-05-30: qty 2

## 2024-05-30 MED ORDER — FREE WATER
150.0000 mL | Status: DC
  Administered 2024-05-30 – 2024-06-01 (×11): 150 mL

## 2024-05-30 MED ORDER — STERILE WATER FOR INJECTION IJ SOLN
INTRAMUSCULAR | Status: AC
  Filled 2024-05-30: qty 10

## 2024-05-30 NOTE — Plan of Care (Signed)
  Problem: Activity: Goal: Ability to tolerate increased activity will improve Outcome: Progressing   Problem: Respiratory: Goal: Ability to maintain a clear airway and adequate ventilation will improve Outcome: Progressing   Problem: Role Relationship: Goal: Method of communication will improve Outcome: Progressing   Problem: Education: Goal: Knowledge of General Education information will improve Description: Including pain rating scale, medication(s)/side effects and non-pharmacologic comfort measures Outcome: Progressing   Problem: Health Behavior/Discharge Planning: Goal: Ability to manage health-related needs will improve Outcome: Progressing   Problem: Clinical Measurements: Goal: Ability to maintain clinical measurements within normal limits will improve Outcome: Progressing Goal: Will remain free from infection Outcome: Progressing Goal: Diagnostic test results will improve Outcome: Progressing Goal: Respiratory complications will improve Outcome: Progressing Goal: Cardiovascular complication will be avoided Outcome: Progressing   Problem: Activity: Goal: Risk for activity intolerance will decrease Outcome: Progressing   Problem: Nutrition: Goal: Adequate nutrition will be maintained Outcome: Progressing   Problem: Coping: Goal: Level of anxiety will decrease Outcome: Progressing   Problem: Elimination: Goal: Will not experience complications related to bowel motility Outcome: Progressing Goal: Will not experience complications related to urinary retention Outcome: Progressing   Problem: Pain Managment: Goal: General experience of comfort will improve and/or be controlled Outcome: Progressing   Problem: Safety: Goal: Ability to remain free from injury will improve Outcome: Progressing   Problem: Skin Integrity: Goal: Risk for impaired skin integrity will decrease Outcome: Progressing

## 2024-05-30 NOTE — Progress Notes (Signed)
 Initial Nutrition Assessment  DOCUMENTATION CODES:  Not applicable  INTERVENTION:  Continue tube feeding via GJ tube: Osmolite 1.2 (or equivalent) at 60 ml/h (1440 ml per day) Start at 30 and advance by 10mL every 4 hours to reach goal Free water : every 4 hours Provides 1728 kcal, 80 gm protein, 1181 ml free water  daily ( free water , TF+flush)  NUTRITION DIAGNOSIS:  Inadequate oral intake related to inability to eat as evidenced by NPO status.  GOAL:  Patient will meet greater than or equal to 90% of their needs  MONITOR:  Weight trends, TF tolerance, Labs, I & O's  REASON FOR ASSESSMENT:  Consult Enteral/tube feeding initiation and management (Home TF)  ASSESSMENT:  Pt with hx of ALS with trach/GJ-tube, and DM type 2 presented to ED with issues with his GJ tube.  11/24 - presented to ED 11/25 - underwent GJ-tube replacement in IR   Patient is currently intubated via trach on home vent. No family present at this time to assist with hx, but pt is awake and alert. Communicated today via facial movements. Pt confirms that things have been going well at home with the tube feeds aside from the clogged j-port. Reports his abdomen feels fine and that he continues to use Osmolite 1.2 (or Isosource HN) without issue. Confirms weight has been stable.   Discussed in rounds, hopeful that pt will be able to discharge back home with family support today. Reviewed home medication list and noted free water  flushes were ordered 125mL q6 hours. Currently ordered q2 hours. Adjust flushes to amount and frequency that better meets estimated needs.   MV: 8 L/min Temp (24hrs), Avg:99.1 F (37.3 C), Min:98.6 F (37 C), Max:100.3 F (37.9 C) MAP (cuff): 86-196mmHg  Admit / Current weight: 74.8 kg    Intake/Output Summary (Last 24 hours) at 05/30/2024 1240 Last data filed at 05/30/2024 0957 Gross per 24 hour  Intake 1840.5 ml  Output 600 ml  Net 1240.5 ml  Net IO Since Admission:  1,125.5 mL [05/30/24 1240]  Drains/Lines: Implanted port Chole drain 10 Fr. RLQ UOP GJ-tube, 24 Fr. LUQ Trach, #8 shiley cuffed  Nutritionally Relevant Medications: Scheduled Meds:  free water   125 mL Per Tube Q2H   glycopyrrolate   1 mg Intravenous BID   pantoprazole  IV  40 mg Intravenous Daily   scopolamine   1 patch Transdermal Q72H   Continuous Infusions:  feeding supplement (OSMOLITE 1.2 CAL) 30 mL/hr at 05/30/24 0700   PRN Meds: polyethylene glycol  Labs Reviewed: CBG ranges from 63-157 mg/dL over the last 24 hours HgbA1c 5.2% (03/24/24)  NUTRITION - FOCUSED PHYSICAL EXAM: Muscle deficits consistent with ALS and bedbound status Flowsheet Row Most Recent Value  Orbital Region No depletion  Upper Arm Region No depletion  Thoracic and Lumbar Region No depletion  Buccal Region No depletion  Temple Region Mild depletion  Clavicle Bone Region No depletion  Clavicle and Acromion Bone Region No depletion  Scapular Bone Region No depletion  Dorsal Hand No depletion  Patellar Region Severe depletion  Anterior Thigh Region Severe depletion  Posterior Calf Region Severe depletion  Edema (RD Assessment) Moderate  Hair Reviewed  Eyes Reviewed  Mouth Reviewed  Skin Reviewed  Nails Reviewed    Diet Order:   Diet Order             Diet NPO time specified  Diet effective now  EDUCATION NEEDS:  Education needs have been addressed  Skin:  Skin Assessment: Reviewed RN Assessment  Last BM:  PTA  Height:  Ht Readings from Last 1 Encounters:  05/28/24 5' 5 (1.651 m)    Weight:  Wt Readings from Last 1 Encounters:  05/28/24 74.8 kg    Ideal Body Weight:  61.8 kg  BMI:  Body mass index is 27.46 kg/m.  Estimated Nutritional Needs:  Kcal:  1600-1800 kcal/d Protein:  80-95 Fluid:  >/=1.8L/d    Vernell Lukes, RD, LDN, CNSC Registered Dietitian II Please reach out via secure chat

## 2024-05-30 NOTE — Discharge Instructions (Signed)
 The Interventional Radiology Team was involved in your care this visit and removed percutaneous cholecystostomy. With any concerns or questions related to this procedure after discharge, you can reach the Interventional Radiology Patient Care Line at [phone] during business hours. We discussed red flag sx of acute cholecystitis which should cause you to present to ER.

## 2024-05-30 NOTE — Progress Notes (Addendum)
 PROGRESS NOTE    Jerry [name]  FMW:[id number] DOB: 04-Feb-1971 DOA: 05/28/2024 PCP: [facility]  Subjective:  No acute events overnight. Seen and examined at bedside. Nonverbal at baseline but able to respont to simple yes or no questions using computer system. Denies any pain, nausea, vomiting, constipation.  Hospital Course:  As per H&P:  53 y/o M w/ PMH of ALS ventilator dependent, GJ tube, prediabetes, who presented w/ malfunction GJ tube malfunction. Hx was obtained via pt's home nurse at bedside and pt as he a computer that speaks what the he is thinking. Pt has has had several admissions for GJ tube malfunction with the tube being replaced on 04/06/24 as per pt's home nurse at bedside. Pt denies any fever, chills, sweating, chest pain, nausea, vomiting, abd pain, nausea, vomiting, dysuria, urinary urgency, urinary frequency, diarrhea or constipation. Of note, pt recently had cholecystitis in which a cholecystotomy tube was placed and it was suppose to be removed today. IR was consulted (Dr. Merrie) by the ER physician who was made aware of this as well and had plans to remove the cholecystostomy tube.    Assessment and Plan:  53 y/o M w/ PMH of ALS ventilator dependent, GJ tube, prediabetes, who presented w/ malfunction GJ tube malfunction.   GJ tube malfunction: Recurrent in nature with multiple past episodes IR consulted. 24 Fr GJ tube replaced on 11/25. XR abd showed possible ileus Tube feeds resumed and tolerating well   H/o cholecystitis:  Cholecystostomy tube was placed by IR on 02/26/24 and the tube was supposed to be removed on 11/24.  IR did cholecystogram which showed irregular lumen of gallbladder with patent appearing cystic and common bile ducts. Indwelling cholecystostomy tube capped 11/25.  - If patient does not develop fevers, leukocytosis or other signs of cholecystitis the plan is to remove the drain prior to hospital discharge.  - spoke with IR who  will plan to remove drain soon  ALS: ventilator dependent. Stable.    VDRF: chronic, in the setting of ALS. PCCM on board. Patient is on his home vent.   Prediabetes: has tube feeds but hold b/c of GJ tube malfunction.    Microcytic anemia:  Iron panel showed iron deficiency anemia.   DVT prophylaxis: enoxaparin  (LOVENOX ) injection 40 mg Start: 05/29/24 0000 SCDs Start: 05/28/24 1649  Lovenox    Code Status: Full Code Disposition Plan: Home with family Reason for continuing need for hospitalization: Severity of illness, close monitoring with cholecystostomy tube  Objective: Vitals:   05/30/24 0830 05/30/24 0900 05/30/24 1110 05/30/24 1530  BP: 120/83 129/83    Pulse: 93 93    Resp: 16 16    Temp:   98.7 F (37.1 C) 98.6 F (37 C)  TempSrc:   Oral Oral  SpO2: 99% 99%    Weight:      Height:        Intake/Output Summary (Last 24 hours) at 05/30/2024 1531 Last data filed at 05/30/2024 0957 Gross per 24 hour  Intake 1715.5 ml  Output 600 ml  Net 1115.5 ml   Filed Weights   05/28/24 1400  Weight: 74.8 kg    Examination:  Physical Exam Vitals and nursing note reviewed.  Constitutional:      General: He is not in acute distress.    Appearance: He is obese. He is ill-appearing (Chronically).  Cardiovascular:     Rate and Rhythm: Normal rate and regular rhythm.     Pulses: Normal pulses.     Heart  sounds: Normal heart sounds.  Pulmonary:     Comments: Coarse breath sounds bilaterally Abdominal:     General: Bowel sounds are normal.     Palpations: Abdomen is soft.  Neurological:     Mental Status: He is alert.     Data Reviewed: I have personally reviewed following labs and imaging studies  CBC: Recent Labs  Lab 05/28/24 1537 05/29/24 0421  WBC 7.8 7.6  NEUTROABS 5.3  --   HGB 9.2* 9.8*  HCT 30.2* 31.9*  MCV 79.3* 77.8*  PLT 216 273   Basic Metabolic Panel: Recent Labs  Lab 05/28/24 2026 05/29/24 0421  NA 140 141  K 3.6 3.3*  CL 106 110   CO2 21* 18*  GLUCOSE 97 76  BUN 9 8  CREATININE <0.30* <0.30*  CALCIUM  10.2 9.9   GFR: CrCl cannot be calculated (This lab value cannot be used to calculate CrCl because it is not a number: <0.30). Liver Function Tests: No results for input(s): AST, ALT, ALKPHOS, BILITOT, PROT, ALBUMIN in the last 168 hours. No results for input(s): LIPASE, AMYLASE in the last 168 hours. No results for input(s): AMMONIA in the last 168 hours. Coagulation Profile: No results for input(s): INR, PROTIME in the last 168 hours. Cardiac Enzymes: No results for input(s): CKTOTAL, CKMB, CKMBINDEX, TROPONINI in the last 168 hours. ProBNP, BNP (last 5 results) No results for input(s): PROBNP, BNP in the last 8760 hours. HbA1C: No results for input(s): HGBA1C in the last 72 hours. CBG: Recent Labs  Lab 05/29/24 2329 05/30/24 0335 05/30/24 0731 05/30/24 1106 05/30/24 1529  GLUCAP 110* 117* 157* 142* 135*   Lipid Profile: No results for input(s): CHOL, HDL, LDLCALC, TRIG, CHOLHDL, LDLDIRECT in the last 72 hours. Thyroid Function Tests: No results for input(s): TSH, T4TOTAL, FREET4, T3FREE, THYROIDAB in the last 72 hours. Anemia Panel: Recent Labs    05/29/24 0421  FERRITIN 81  TIBC 312  IRON 42*   Sepsis Labs: No results for input(s): PROCALCITON, LATICACIDVEN in the last 168 hours.  Recent Results (from the past 240 hours)  MRSA Next Gen by PCR, Nasal     Status: None   Collection Time: 05/29/24  4:11 AM   Specimen: Nasal Mucosa; Nasal Swab  Result Value Ref Range Status   MRSA by PCR Next Gen NOT DETECTED NOT DETECTED Final    Comment: (NOTE) The GeneXpert MRSA Assay (FDA approved for NASAL specimens only), is one component of a comprehensive MRSA colonization surveillance program. It is not intended to diagnose MRSA infection nor to guide or monitor treatment for MRSA infections. Test performance is not FDA approved in  patients less than 50 years old. Performed at [facility], [address] [place], [place] [address]      Radiology Studies: IR GJ Tube Change Result Date: 05/29/2024 INDICATION: 53 year old male with history of acute cholecystitis status post percutaneous cholecystostomy tube placement on 02/26/2024 in addition to chronic indwelling gastrojejunostomy tube, presenting with clogged jejunal arm. EXAM: 1. FLUOROSCOPIC GUIDED REPLACEMENT OF GASTROJEJUNOSTOMY TUBE 2. Cholecystostomy tube check. COMPARISON:  02/29/2024, 02/26/2024 MEDICATIONS: None. CONTRAST:  55mL OMNIPAQUE  IOHEXOL  300 MG/ML  SOLN FLUOROSCOPY TIME:  Forty-five mGy reference air kerma COMPLICATIONS: None. PROCEDURE: Informed written consent was obtained from patient's family member after a discussion of the risks and benefits. The upper abdomen and the external portion of the existing gastrojejunostomy tube was prepped and draped in the usual sterile fashion, and a sterile drape was applied covering the operative field.  Maximum barrier sterile technique with sterile gowns and gloves were used for the procedure. A timeout was performed prior to the initiation of the procedure. Contrast injection was performed demonstrated patency of the jejunal and gastric ports. A stiff glidewire was inserted via the jejunal arm and was unable to be passed the region of the retention balloon. Therefore, a 4 French Navicross catheter was used to assist in traversing this stenotic region and the Glidewire was advanced to the proximal jejunum. Under intermittent fluoroscopic guidance, a new 24 gastrojejunostomy catheter was advanced through the established access site with tip ultimately terminating within the proximal small bowel. Contrast injection via the jejunostomy and gastric lumens confirmed appropriate functioning and positioning. A dressing was placed. Contrast was then injected via the indwelling percutaneous cholecystostomy tube. The gallbladder  lumen demonstrated diffuse irregularity. There is patency of the cystic and common bile duct which drains into the duodenum. The pigtail portion of the catheter was retracted from the infundibulum to the body of the gallbladder. The drain was capped. The patient tolerated procedure well without immediate postprocedural complication. IMPRESSION: 1. Successful fluoroscopic guided replacement of occluded 24 French gastrojejunostomy catheter. The tip of the jejunostomy lumen lies within the proximal jejunum. Both lumens ready for immediate use. 2. Mucosal irregularity throughout the gallbladder lumen with patent appearing cystic and common bile ducts. The drain was capped in hopes of possible removal if capping trial as tolerated. Ester Sides, MD [facility] Radiology Electronically Signed   By: Ester Sides M.D.   On: 05/29/2024 15:53   IR CHOLANGIOGRAM EXISTING TUBE Result Date: 05/29/2024 INDICATION: 53 year old male with history of acute cholecystitis status post percutaneous cholecystostomy tube placement on 02/26/2024 in addition to chronic indwelling gastrojejunostomy tube, presenting with clogged jejunal arm. EXAM: 1. FLUOROSCOPIC GUIDED REPLACEMENT OF GASTROJEJUNOSTOMY TUBE 2. Cholecystostomy tube check. COMPARISON:  02/29/2024, 02/26/2024 MEDICATIONS: None. CONTRAST:  55mL OMNIPAQUE  IOHEXOL  300 MG/ML  SOLN FLUOROSCOPY TIME:  Forty-five mGy reference air kerma COMPLICATIONS: None. PROCEDURE: Informed written consent was obtained from patient's family member after a discussion of the risks and benefits. The upper abdomen and the external portion of the existing gastrojejunostomy tube was prepped and draped in the usual sterile fashion, and a sterile drape was applied covering the operative field. Maximum barrier sterile technique with sterile gowns and gloves were used for the procedure. A timeout was performed prior to the initiation of the procedure.  Contrast injection was performed demonstrated patency of the jejunal and gastric ports. A stiff glidewire was inserted via the jejunal arm and was unable to be passed the region of the retention balloon. Therefore, a 4 French Navicross catheter was used to assist in traversing this stenotic region and the Glidewire was advanced to the proximal jejunum. Under intermittent fluoroscopic guidance, a new 24 gastrojejunostomy catheter was advanced through the established access site with tip ultimately terminating within the proximal small bowel. Contrast injection via the jejunostomy and gastric lumens confirmed appropriate functioning and positioning. A dressing was placed. Contrast was then injected via the indwelling percutaneous cholecystostomy tube. The gallbladder lumen demonstrated diffuse irregularity. There is patency of the cystic and common bile duct which drains into the duodenum. The pigtail portion of the catheter was retracted from the infundibulum to the body of the gallbladder. The drain was capped. The patient tolerated procedure well without immediate postprocedural complication. IMPRESSION: 1. Successful fluoroscopic guided replacement of occluded 24 French gastrojejunostomy catheter. The tip of the jejunostomy lumen lies within the proximal jejunum.  Both lumens ready for immediate use. 2. Mucosal irregularity throughout the gallbladder lumen with patent appearing cystic and common bile ducts. The drain was capped in hopes of possible removal if capping trial as tolerated. Ester Sides, MD [facility] Radiology Electronically Signed   By: Ester Sides M.D.   On: 05/29/2024 15:53   DG Abd Portable 1V Result Date: 05/28/2024 EXAM: 1 VIEW XRAY OF THE ABDOMEN 05/28/2024 07:32:00 PM COMPARISON: 04/06/2024 CLINICAL HISTORY: Abdominal distention FINDINGS: LINES, TUBES AND DEVICES: Percutaneous pigtail catheter in right upper quadrant. Gastrojejunostomy  tube with tip in distal duodenum. BOWEL: Scattered large and small bowel gas is noted without true obstructive change. This may represent an ileus. SOFT TISSUES: No opaque urinary calculi. BONES: No acute osseous abnormality. IMPRESSION: 1. Scattered large and small bowel gas without true obstructive change, possibly representing an ileus. Electronically signed by: Oneil Devonshire MD 05/28/2024 09:59 PM EST RP Workstation: [id number]    Scheduled Meds:  Chlorhexidine  Gluconate Cloth  6 each Topical Daily   enoxaparin  (LOVENOX ) injection  40 mg Subcutaneous Q24H   free water   150 mL Per Tube Q4H   glycopyrrolate   1 mg Intravenous BID   lidocaine -EPINEPHrine   20 mL Intradermal Once   pantoprazole  (PROTONIX ) IV  40 mg Intravenous Daily   scopolamine   1 patch Transdermal Q72H   sodium chloride  flush  10 mL Intracatheter Q12H   sodium chloride  flush  3 mL Intravenous Q12H   sterile water  (preservative free)       Continuous Infusions:  feeding supplement (OSMOLITE 1.2 CAL) 50 mL/hr at 05/30/24 0730     LOS: 1 day   Norval Bar, MD  [facility]  05/30/2024, 3:31 PM

## 2024-05-30 NOTE — Progress Notes (Signed)
 Supervising Physician: Philip Cornet  Patient Status:  [facility] - In-pt  Chief Complaint:  S/p 02/26/24 IR Perc Chole  Subjective:  Pt resting in bed, able to communicate via eye movement reader. Denies fever, chills, abd pain, nausea/emesis.   He is nearing discharge. Team has reached out to IR for consideration for removal of perc chole placed 02/26/24 by Dr. Johann.   Allergies: Crab [shellfish allergy]  Medications: Prior to Admission medications   Medication Sig Start Date End Date Taking? Authorizing Provider  cetirizine  HCl (ZYRTEC ) 5 MG/5ML SOLN Take 10 mLs (10 mg total) by mouth at bedtime. Patient taking differently: Place 10 mLs into feeding tube at bedtime. 07/27/23  Yes Sowles, Krichna, MD  doxycycline  (VIBRAMYCIN ) 100 MG capsule Take 100 mg by mouth 2 (two) times daily. 05/18/24  Yes [provider]  glycopyrrolate  (CUVPOSA ) 0.2 mg/ml SOLN Place 5 mLs (1,000 mcg total) into feeding tube 2 (two) times daily. 04/08/24  Yes Jillian Buttery, MD  ipratropium-albuterol  (DUONEB) 0.5-2.5 (3) MG/3ML SOLN USE 3 ML VIA NEBULIZER EVERY 4 HOURS AS NEEDED Patient taking differently: Inhale 3 mLs into the lungs every 4 (four) hours as needed (wheezing/sob). 11/04/23  Yes Sowles, Krichna, MD  polyethylene glycol powder (GLYCOLAX /MIRALAX ) 17 GM/SCOOP powder Place 17 g into feeding tube daily. Titrate as needed for constipation Patient taking differently: Place 17 g into feeding tube daily as needed for mild constipation or moderate constipation. 07/27/23  Yes Sowles, Krichna, MD  Polyvinyl Alcohol -Povidone (CLEAR EYES NATURAL TEARS) 5-6 MG/ML SOLN Apply 1 drop to eye 3 (three) times daily as needed. Patient taking differently: Place 1 drop into both eyes daily as needed (dry eye). 07/27/23  Yes Sowles, Krichna, MD  scopolamine  (TRANSDERM-SCOP) 1 MG/3DAYS PLACE 1 PATCH ONTO THE SKIN EVERY 3 DAYS Patient taking differently: Place 1 patch onto the skin every 3 (three) days. 08/18/23  Yes  Sowles, Krichna, MD  Simethicone  40 MG/0.6ML LIQD 1.2 mLs (80 mg total) by Per J Tube route in the morning, at noon, in the evening, and at bedtime. Patient taking differently: Place 80 mg into feeding tube at bedtime. 03/12/24  Yes Gonfa, Taye T, MD  metoCLOPramide  (REGLAN ) 5 MG/5ML solution Place 10 mLs (10 mg total) into feeding tube every 8 (eight) hours as needed for nausea. Patient not taking: Reported on 05/29/2024 04/08/24   Jillian Buttery, MD  Potassium Chloride  40 MEQ/15ML (20%) SOLN TAKE 15 ML BY MOUTH TWICE DAILY Patient not taking: No sig reported 08/26/23   Sowles, Krichna, MD  pyridostigmine  (MESTINON ) 60 MG/5ML solution Place 2.5 mLs (30 mg total) into feeding tube 2 (two) times daily. Patient not taking: Reported on 04/06/2024 03/12/24   Gonfa, Taye T, MD  sodium chloride  flush (NS) 0.9 % SOLN 10-40 mLs by Intracatheter route every 12 (twelve) hours. Patient not taking: Reported on 05/29/2024 03/12/24   Gonfa, Taye T, MD  Water  For Irrigation, Sterile (FREE WATER ) SOLN Place 125 mLs into feeding tube every 6 (six) hours. Patient taking differently: Place 125 mLs into feeding tube every 2 (two) hours. 03/12/24   Kathrin Mignon DASEN, MD     Vital Signs: BP 125/85 (BP Location: Right Arm)   Pulse 80   Temp 98.6 F (37 C) (Oral)   Resp 16   Ht 5' 5 (1.651 m)   Wt 165 lb (74.8 kg)   SpO2 99%   BMI 27.46 kg/m   Physical Exam Cardiovascular:     Rate and Rhythm: Normal rate.  Pulses: Normal pulses.  Abdominal:     General: There is no distension.     Palpations: Abdomen is soft.     Tenderness: There is no abdominal tenderness.     Comments: RUQ perc chole drain with brown drainage around capped drain tube. Granulation tissue present but nontender and not bleeding.   Skin:    General: Skin is warm and dry.  Neurological:     Mental Status: He is alert.  Psychiatric:        Mood and Affect: Mood normal.        Behavior: Behavior normal.     Imaging: IR GJ Tube  Change Result Date: 05/29/2024 INDICATION: 53 year old male with history of acute cholecystitis status post percutaneous cholecystostomy tube placement on 02/26/2024 in addition to chronic indwelling gastrojejunostomy tube, presenting with clogged jejunal arm. EXAM: 1. FLUOROSCOPIC GUIDED REPLACEMENT OF GASTROJEJUNOSTOMY TUBE 2. Cholecystostomy tube check. COMPARISON:  02/29/2024, 02/26/2024 MEDICATIONS: None. CONTRAST:  55mL OMNIPAQUE  IOHEXOL  300 MG/ML  SOLN FLUOROSCOPY TIME:  Forty-five mGy reference air kerma COMPLICATIONS: None. PROCEDURE: Informed written consent was obtained from patient's family member after a discussion of the risks and benefits. The upper abdomen and the external portion of the existing gastrojejunostomy tube was prepped and draped in the usual sterile fashion, and a sterile drape was applied covering the operative field. Maximum barrier sterile technique with sterile gowns and gloves were used for the procedure. A timeout was performed prior to the initiation of the procedure. Contrast injection was performed demonstrated patency of the jejunal and gastric ports. A stiff glidewire was inserted via the jejunal arm and was unable to be passed the region of the retention balloon. Therefore, a 4 French Navicross catheter was used to assist in traversing this stenotic region and the Glidewire was advanced to the proximal jejunum. Under intermittent fluoroscopic guidance, a new 24 gastrojejunostomy catheter was advanced through the established access site with tip ultimately terminating within the proximal small bowel. Contrast injection via the jejunostomy and gastric lumens confirmed appropriate functioning and positioning. A dressing was placed. Contrast was then injected via the indwelling percutaneous cholecystostomy tube. The gallbladder lumen demonstrated diffuse irregularity. There is patency of the cystic and common bile duct which drains into the duodenum. The pigtail portion of the  catheter was retracted from the infundibulum to the body of the gallbladder. The drain was capped. The patient tolerated procedure well without immediate postprocedural complication. IMPRESSION: 1. Successful fluoroscopic guided replacement of occluded 24 French gastrojejunostomy catheter. The tip of the jejunostomy lumen lies within the proximal jejunum. Both lumens ready for immediate use. 2. Mucosal irregularity throughout the gallbladder lumen with patent appearing cystic and common bile ducts. The drain was capped in hopes of possible removal if capping trial as tolerated. Ester Sides, MD [facility] Radiology Electronically Signed   By: Ester Sides M.D.   On: 05/29/2024 15:53   IR CHOLANGIOGRAM EXISTING TUBE Result Date: 05/29/2024 INDICATION: 53 year old male with history of acute cholecystitis status post percutaneous cholecystostomy tube placement on 02/26/2024 in addition to chronic indwelling gastrojejunostomy tube, presenting with clogged jejunal arm. EXAM: 1. FLUOROSCOPIC GUIDED REPLACEMENT OF GASTROJEJUNOSTOMY TUBE 2. Cholecystostomy tube check. COMPARISON:  02/29/2024, 02/26/2024 MEDICATIONS: None. CONTRAST:  55mL OMNIPAQUE  IOHEXOL  300 MG/ML  SOLN FLUOROSCOPY TIME:  Forty-five mGy reference air kerma COMPLICATIONS: None. PROCEDURE: Informed written consent was obtained from patient's family member after a discussion of the risks and benefits. The upper abdomen and the external portion of the existing  gastrojejunostomy tube was prepped and draped in the usual sterile fashion, and a sterile drape was applied covering the operative field. Maximum barrier sterile technique with sterile gowns and gloves were used for the procedure. A timeout was performed prior to the initiation of the procedure. Contrast injection was performed demonstrated patency of the jejunal and gastric ports. A stiff glidewire was inserted via the jejunal arm and was unable to  be passed the region of the retention balloon. Therefore, a 4 French Navicross catheter was used to assist in traversing this stenotic region and the Glidewire was advanced to the proximal jejunum. Under intermittent fluoroscopic guidance, a new 24 gastrojejunostomy catheter was advanced through the established access site with tip ultimately terminating within the proximal small bowel. Contrast injection via the jejunostomy and gastric lumens confirmed appropriate functioning and positioning. A dressing was placed. Contrast was then injected via the indwelling percutaneous cholecystostomy tube. The gallbladder lumen demonstrated diffuse irregularity. There is patency of the cystic and common bile duct which drains into the duodenum. The pigtail portion of the catheter was retracted from the infundibulum to the body of the gallbladder. The drain was capped. The patient tolerated procedure well without immediate postprocedural complication. IMPRESSION: 1. Successful fluoroscopic guided replacement of occluded 24 French gastrojejunostomy catheter. The tip of the jejunostomy lumen lies within the proximal jejunum. Both lumens ready for immediate use. 2. Mucosal irregularity throughout the gallbladder lumen with patent appearing cystic and common bile ducts. The drain was capped in hopes of possible removal if capping trial as tolerated. Ester Sides, MD [facility] Radiology Electronically Signed   By: Ester Sides M.D.   On: 05/29/2024 15:53   DG Abd Portable 1V Result Date: 05/28/2024 EXAM: 1 VIEW XRAY OF THE ABDOMEN 05/28/2024 07:32:00 PM COMPARISON: 04/06/2024 CLINICAL HISTORY: Abdominal distention FINDINGS: LINES, TUBES AND DEVICES: Percutaneous pigtail catheter in right upper quadrant. Gastrojejunostomy tube with tip in distal duodenum. BOWEL: Scattered large and small bowel gas is noted without true obstructive change. This may represent an ileus. SOFT  TISSUES: No opaque urinary calculi. BONES: No acute osseous abnormality. IMPRESSION: 1. Scattered large and small bowel gas without true obstructive change, possibly representing an ileus. Electronically signed by: Oneil Devonshire MD 05/28/2024 09:59 PM EST RP Workstation: [id number]   DG Chest Portable 1 View Result Date: 05/28/2024 EXAM: 1 VIEW(S) XRAY OF THE CHEST 05/28/2024 02:47:00 PM COMPARISON: 02/26/2024 CLINICAL HISTORY: Vent pt, ? JG tube malfunction FINDINGS: LINES, TUBES AND DEVICES: Tracheostomy tube in place with tip about 3.6 cm above the carina. Right chest wall dual-lumen port catheter in place with tip overlying right atrium. Left PICC removed. Incompletely visualized gastrojejunal tube in the upper abdomen LUNGS AND PLEURA: Low lung volumes. No focal pulmonary opacity. No pleural effusion. No pneumothorax. HEART AND MEDIASTINUM: No acute abnormality of the cardiac and mediastinal silhouettes. BONES AND SOFT TISSUES: No acute osseous abnormality. IMPRESSION: 1. No acute cardiopulmonary process. Electronically signed by: Luke Bun MD 05/28/2024 03:19 PM EST RP Workstation: [id number]    Labs:  CBC: Recent Labs    04/07/24 0510 05/28/24 1537 05/29/24 0421 05/30/24 1647  WBC 12.5* 7.8 7.6 6.1  HGB 8.4* 9.2* 9.8* 9.7*  HCT 27.2* 30.2* 31.9* 31.7*  PLT 291 216 273 262    COAGS: Recent Labs    02/27/24 0441  INR 1.4*    BMP: Recent Labs    11/11/23 0000 02/26/24 0424 02/26/24 0703 02/26/24 9273 04/07/24 0510 05/28/24 2026 05/29/24 0421 05/30/24 1647  NA  --    < > DUP   < > 138 140 141 138  K  --    < > DUP   < > 3.5 3.6 3.3* 3.5  CL  --    < > DUP   < > 109 106 110 109  CO2  --    < > DUP   < > 14* 21* 18* 18*  GLUCOSE  --    < > DUP   < > 54* 97 76 158*  BUN  --    < > DUP   < > 6 9 8 8   CALCIUM   --    < > DUP   < > 9.2 10.2 9.9 9.3  CREATININE  --    < > DUP   < > 0.43* <0.30* <0.30* <0.30*  GFRNONAA  --    < > DUP   < > >60 NOT CALCULATED NOT CALCULATED  NOT CALCULATED  GFRAA 160  --  DUP  --   --   --   --   --    < > = values in this interval not displayed.    LIVER FUNCTION TESTS: Recent Labs    03/07/24 0500 03/08/24 0356 03/09/24 0404 03/11/24 0226 03/12/24 0225  BILITOT 1.1 1.3* 1.2 1.3*  --   AST 37 31 29 23   --   ALT 48* 44 40 33  --   ALKPHOS 414* 416* 362* 341*  --   PROT 7.4 7.4 7.5 8.1  --   ALBUMIN 2.5* 2.6* 2.7* 2.9* 2.8*    Assessment and Plan:  Cholecystostomy tube placed by IR on 02/26/24. GB drain injection 11/24 by Dr. Jennefer: 1. Successful fluoroscopic guided replacement of occluded 24 French gastrojejunostomy catheter. The tip of the jejunostomy lumen lies within the proximal jejunum. Both lumens ready for immediate use. 2. Mucosal irregularity throughout the gallbladder lumen with patent appearing cystic and common bile ducts. The drain was capped in hopes of possible removal if capping trial as tolerated. WBC today WNL. Drainage around drain has been occurring since far before capping trial. No ROS or VS indication of new cholecystitis. Typically, capping trial would be for 1 week, but this atypical case was reviewed with Dr. Jennefer today, and decision was made to move forward with plan to remove drain today given encouraging findings.  Wife also aware of risk of recurrence and need for replacement of drain. Discussed red flag sx and need for frequent dressing changes over the next week as will likely continue to ooze for several days.   Consent obtained. Site cleansed with betadine. Ext suture and stat lock removed. Drain cut, releasing internal suture and drain/int suture removed in entirety. Brief bleeding from granulation tissue present, hemostasis easily gained. Dressing applied and RN/IP MD aware.   No further IR intervention indicated. Please reach out with further concerns.    Electronically Signed: Laymon Coast, NP 05/30/2024, 5:53 PM   I spent a total of 25 Minutes at the the patient's  bedside AND on the patient's hospital floor or unit, greater than 50% of which was counseling/coordinating care for perc chole removal.

## 2024-05-30 NOTE — Hospital Course (Addendum)
 53 y/o M w/ PMH of ALS ventilator dependent, GJ tube, prediabetes, who presented w/ malfunction GJ tube malfunction. Pt has has had several admissions for GJ tube malfunction with the tube being replaced on 04/06/24. Of note, pt recently had acute cholecystitis for which a cholecystotomy tube was placed and supposed to be removed 11/24. IR was consulted, G tube replaced, and cholecystostomy drain was removed. Patient was resumed of tube feeds and tolerated them well. Stable for discharge home with family assistance.

## 2024-05-31 DIAGNOSIS — G1221 Amyotrophic lateral sclerosis: Secondary | ICD-10-CM | POA: Diagnosis not present

## 2024-05-31 LAB — GLUCOSE, CAPILLARY
Glucose-Capillary: 135 mg/dL — ABNORMAL HIGH (ref 70–99)
Glucose-Capillary: 186 mg/dL — ABNORMAL HIGH (ref 70–99)
Glucose-Capillary: 135 mg/dL — ABNORMAL HIGH (ref 70–99)
Glucose-Capillary: 170 mg/dL — ABNORMAL HIGH (ref 70–99)
Glucose-Capillary: 161 mg/dL — ABNORMAL HIGH (ref 70–99)
Glucose-Capillary: 125 mg/dL — ABNORMAL HIGH (ref 70–99)

## 2024-05-31 MED ORDER — POTASSIUM CHLORIDE 20 MEQ PO PACK
40.0000 meq | PACK | Freq: Once | ORAL | Status: AC
  Administered 2024-05-31: 40 meq
  Filled 2024-05-31: qty 2

## 2024-05-31 MED ORDER — POLYETHYLENE GLYCOL 3350 17 G PO PACK
17.0000 g | PACK | Freq: Every day | ORAL | Status: DC
  Administered 2024-05-31: 17 g
  Filled 2024-05-31: qty 1

## 2024-05-31 NOTE — Plan of Care (Signed)
 Progressing Add All Activity: Ability to tolerate increased activity will improve Add Today at 2149 - Progressing by Rolfe Corean HERO, RN Add Respiratory: Ability to maintain a clear airway and adequate ventilation will improve Add Today at 2149 - Progressing by Rolfe Corean HERO, RN Add Role Relationship: Method of communication will improve Add Today at 2149 - Progressing by Rolfe Corean HERO, RN Add Education: Knowledge of General Education information will improve Add Today at 2149 - Progressing by Rolfe Corean HERO, RN Add Health Behavior/Discharge Planning: Ability to manage health-related needs will improve Add Today at 2149 - Progressing by Rolfe Corean HERO, RN Add Clinical Measurements: Ability to maintain clinical measurements within normal limits will improve Add Today at 2149 - Progressing by Rolfe Corean HERO, RN Add Will remain free from infection Add Today at 2149 - Progressing by Rolfe Corean HERO, RN Add Diagnostic test results will improve Add Today at 2149 - Progressing by Rolfe Corean HERO, RN Add Respiratory complications will improve Add Today at 2149 - Progressing by Rolfe Corean HERO, RN Add Cardiovascular complication will be avoided Add Today at 2149 - Progressing by Rolfe Corean HERO, RN Add Activity: Risk for activity intolerance will decrease Add Today at 2149 - Progressing by Rolfe Corean HERO, RN Add Nutrition: Adequate nutrition will be maintained Add Today at 2149 - Progressing by Rolfe Corean HERO, RN Add Coping: Level of anxiety will decrease Add Today at 2149 - Progressing by Rolfe Corean HERO, RN Add Elimination: Will not experience complications related to bowel motility Add Today at 2149 - Progressing by Rolfe Corean HERO, RN Add Will not experience complications related to urinary retention Add Today at 2149 - Progressing by Rolfe Corean HERO, RN Add Pain Managment: General experience of comfort will improve and/or be controlled Add Today at 2149 - Progressing by Rolfe Corean HERO, RN Add Safety: Ability to remain free from injury will improve Add Today at 2149 - Progressing by Rolfe Corean HERO, RN Add Skin Integrity: Risk for impaired skin integrity will decrease Add Today at 2149 - Progressing by Rolfe Corean HERO, RN

## 2024-05-31 NOTE — Care Management Obs Status (Cosign Needed)
 MEDICARE OBSERVATION STATUS NOTIFICATION   Patient Details  Name: Jerry [name] MRN: [id number] Date of Birth: 1971-04-15   Medicare Observation Status Notification Given:  Yes    [name], RN 05/31/2024, 1:12 PM

## 2024-05-31 NOTE — Care Management CC44 (Cosign Needed)
 Condition Code 44 Documentation Completed  Patient Details  Name: Jerry [name] MRN: [id number] Date of Birth: Jun 09, 1971   Condition Code 44 given:  Yes Patient signature on Condition Code 44 notice:  Yes Documentation of 2 MD's agreement:  Yes Code 44 added to claim:  Yes    [name], RN 05/31/2024, 1:12 PM

## 2024-05-31 NOTE — Progress Notes (Signed)
 Pharmacy Electrolyte Replacement  Recent Labs:  Recent Labs    05/30/24 1647  K 3.5  CREATININE <0.30*    Low Critical Values (K </= 2.5, Phos </= 1, Mg </= 1) Present: None  Plan: Give KCL 40 meq per tube x 1 dose.   Treina Arscott, PharmD

## 2024-05-31 NOTE — TOC Progression Note (Addendum)
 Transition of Care [facility]) - Progression Note    Patient Details  Name: Jerry [name] MRN: [id number] Date of Birth: May 13, 1971  [facility] of [facility] CM/SW Contact  [name], RN Phone Number: 05/31/2024, 9:25 AM  Clinical Narrative:    CM called and spoke with the patient's wife by phone and she states that she is aware that biliary drain was pulled but was nervous about taking care of the patient today and would like the patient to return to the home tomorrow via PTAR.  Patient has all needed supplies at the home including trach supplies, PEG tube supplies and Enteral feeds through [employer].  Patient has private duty nursing through [employer] private duty nursing.  Wife requests ordering incontinence supplies through [employer] - including condom cath supplies - [employer] was called and updated regarding requested supplies.  Patient's home ventilator is in the room and wife states that she normally rides home with the patient by ambulance.  I asked by Dr. Cosette to call and speak with the wife regarding medical update.  05/31/2024 - Moon provided to the wife and copy left in the patient's room.  Plan is for patient to be set up for PTAR tomorrow to home around 10 am.  The patient's wife plans to be at the hospital tomorrow around 10 am to ride with the patient to home since patient's home vent is present at the Bedside.  Patient has all supplies at home.  PTAR will need to be scheduled by Care management tomorrow around 10 am since I am unable to schedule PTAR today due to the holidays.  Charge RN was notified to follow up with care management in the am.   Expected Discharge Plan: Home w Home Health Services Barriers to Discharge: Continued Medical Work up               Expected Discharge Plan and Services   Discharge Planning Services: CM Consult Post Acute Care Choice: Home Health Living arrangements for the past 2 months: Single Family Home                                        Social Drivers of Health (SDOH) Interventions SDOH Screenings   Food Insecurity: No Food Insecurity (05/29/2024)  Housing: Low Risk  (05/29/2024)  Transportation Needs: No Transportation Needs (05/29/2024)  Utilities: Not At Risk (05/29/2024)  Alcohol  Screen: Low Risk  (09/04/2020)  Depression (PHQ2-9): Low Risk  (01/24/2024)  Financial Resource Strain: Low Risk  (09/29/2022)  Physical Activity: Inactive (10/11/2023)  Social Connections: Unknown (10/11/2023)  Stress: Stress Concern Present (10/11/2023)  Tobacco Use: Low Risk  (05/28/2024)  Health Literacy: Adequate Health Literacy (10/11/2023)    Readmission Risk Interventions    05/31/2024    9:25 AM 03/12/2024    4:10 PM  Readmission Risk Prevention Plan  Transportation Screening Complete Complete  PCP or Specialist Appt within 5-7 Days Complete Complete  Home Care Screening Complete Complete  Medication Review (RN CM) Complete Complete

## 2024-05-31 NOTE — Progress Notes (Signed)
 PROGRESS NOTE    Jerry [name]  FMW:[id number] DOB: 1970/12/30 DOA: 05/28/2024 PCP: [facility]  Subjective:  No acute events overnight. Seen and examined at bedside. Nonverbal at baseline but able to respond to simple yes or no questions using computer system. No new complaints.   Hospital Course: 53 y/o M w/ PMH of ALS ventilator dependent, GJ tube, prediabetes, who presented w/ malfunction GJ tube malfunction. Pt has has had several admissions for GJ tube malfunction with the tube being replaced on 04/06/24. Of note, pt recently had acute cholecystitis for which a cholecystotomy tube was placed and supposed to be removed 11/24. IR was consulted (Dr. Merrie) and plan to remove the cholecystostomy tube during admission.     Assessment and Plan:  53 y/o M w/ PMH of ALS ventilator dependent, GJ tube, prediabetes, who presented w/ malfunction GJ tube malfunction.    GJ tube malfunction: Recurrent in nature with multiple past episodes. 24 Fr GJ tube replaced on 11/25. XR abd showed possible ileus. - Tube feeds resumed and tolerating well    H/o cholecystitis:  Cholecystostomy tube was placed by IR on 02/26/24 and the tube was supposed to be removed on 11/24. IR did cholecystogram which showed irregular lumen of gallbladder with patent appearing cystic and common bile ducts. Indwelling cholecystostomy tube capped 11/25. Discussed with IR and drain removed 11/26.  - monitor clinically   ALS: ventilator dependent. Stable.    VDRF: chronic, in the setting of ALS. PCCM on board. Patient is on his home vent.   Prediabetes: has tube feeds but hold b/c of GJ tube malfunction.    Microcytic anemia:  Iron panel showed iron deficiency anemia.   DVT prophylaxis: enoxaparin  (LOVENOX ) injection 40 mg Start: 05/29/24 0000 SCDs Start: 05/28/24 1649  Lovenox    Code Status: Full Code Family Communication: Spoke with wife and updated on current treatment plan Disposition Plan: Home with  family Reason for continuing need for hospitalization: Need to get transport with wife to accompany patient home likely tomorrow 11/28.  Objective: Vitals:   05/31/24 1000 05/31/24 1100 05/31/24 1114 05/31/24 1219  BP: 112/75 118/88    Pulse: 79 99 (!) 103   Resp: 16 16 17    Temp:   99.4 F (37.4 C)   TempSrc:   Oral   SpO2: 100% 99% 100% 100%  Weight:      Height:        Intake/Output Summary (Last 24 hours) at 05/31/2024 1250 Last data filed at 05/31/2024 1226 Gross per 24 hour  Intake 1949.67 ml  Output 540 ml  Net 1409.67 ml   Filed Weights   05/28/24 1400  Weight: 74.8 kg    Examination:  Physical Exam Vitals reviewed.  Constitutional:      General: He is not in acute distress.    Appearance: He is ill-appearing (chronically).  Cardiovascular:     Rate and Rhythm: Normal rate and regular rhythm.     Pulses: Normal pulses.     Heart sounds: Normal heart sounds.  Pulmonary:     Effort: Pulmonary effort is normal.     Breath sounds: Normal breath sounds.  Abdominal:     General: Bowel sounds are normal.     Palpations: Abdomen is soft.  Neurological:     Mental Status: He is alert. Mental status is at baseline.     Data Reviewed: I have personally reviewed following labs and imaging studies  CBC: Recent Labs  Lab 05/28/24 1537 05/29/24 0421 05/30/24 1647  WBC 7.8 7.6 6.1  NEUTROABS 5.3  --   --   HGB 9.2* 9.8* 9.7*  HCT 30.2* 31.9* 31.7*  MCV 79.3* 77.8* 79.1*  PLT 216 273 262   Basic Metabolic Panel: Recent Labs  Lab 05/28/24 2026 05/29/24 0421 05/30/24 1647  NA 140 141 138  K 3.6 3.3* 3.5  CL 106 110 109  CO2 21* 18* 18*  GLUCOSE 97 76 158*  BUN 9 8 8   CREATININE <0.30* <0.30* <0.30*  CALCIUM  10.2 9.9 9.3   GFR: CrCl cannot be calculated (This lab value cannot be used to calculate CrCl because it is not a number: <0.30). Liver Function Tests: No results for input(s): AST, ALT, ALKPHOS, BILITOT, PROT, ALBUMIN in the  last 168 hours. No results for input(s): LIPASE, AMYLASE in the last 168 hours. No results for input(s): AMMONIA in the last 168 hours. Coagulation Profile: No results for input(s): INR, PROTIME in the last 168 hours. Cardiac Enzymes: No results for input(s): CKTOTAL, CKMB, CKMBINDEX, TROPONINI in the last 168 hours. ProBNP, BNP (last 5 results) No results for input(s): PROBNP, BNP in the last 8760 hours. HbA1C: No results for input(s): HGBA1C in the last 72 hours. CBG: Recent Labs  Lab 05/30/24 1928 05/30/24 2330 05/31/24 0336 05/31/24 0740 05/31/24 1113  GLUCAP 142* 162* 135* 186* 135*   Lipid Profile: No results for input(s): CHOL, HDL, LDLCALC, TRIG, CHOLHDL, LDLDIRECT in the last 72 hours. Thyroid Function Tests: No results for input(s): TSH, T4TOTAL, FREET4, T3FREE, THYROIDAB in the last 72 hours. Anemia Panel: Recent Labs    05/29/24 0421  FERRITIN 81  TIBC 312  IRON 42*   Sepsis Labs: No results for input(s): PROCALCITON, LATICACIDVEN in the last 168 hours.  Recent Results (from the past 240 hours)  MRSA Next Gen by PCR, Nasal     Status: None   Collection Time: 05/29/24  4:11 AM   Specimen: Nasal Mucosa; Nasal Swab  Result Value Ref Range Status   MRSA by PCR Next Gen NOT DETECTED NOT DETECTED Final    Comment: (NOTE) The GeneXpert MRSA Assay (FDA approved for NASAL specimens only), is one component of a comprehensive MRSA colonization surveillance program. It is not intended to diagnose MRSA infection nor to guide or monitor treatment for MRSA infections. Test performance is not FDA approved in patients less than 52 years old. Performed at [facility], [address] [place], [place] [address]      Radiology Studies: No results found.  Scheduled Meds:  Chlorhexidine  Gluconate Cloth  6 each Topical Daily   enoxaparin  (LOVENOX ) injection  40 mg Subcutaneous Q24H   free water   150 mL Per Tube  Q4H   glycopyrrolate   1 mg Intravenous BID   lidocaine -EPINEPHrine   20 mL Intradermal Once   pantoprazole  (PROTONIX ) IV  40 mg Intravenous Daily   polyethylene glycol  17 g Per Tube Daily   scopolamine   1 patch Transdermal Q72H   sodium chloride  flush  10 mL Intracatheter Q12H   sodium chloride  flush  3 mL Intravenous Q12H   Continuous Infusions:  feeding supplement (OSMOLITE 1.2 CAL) 60 mL/hr at 05/31/24 1100     LOS: 2 days   Norval Bar, MD  [facility]  05/31/2024, 12:50 PM

## 2024-06-01 DIAGNOSIS — G1221 Amyotrophic lateral sclerosis: Secondary | ICD-10-CM | POA: Diagnosis not present

## 2024-06-01 LAB — GLUCOSE, CAPILLARY
Glucose-Capillary: 159 mg/dL — ABNORMAL HIGH (ref 70–99)
Glucose-Capillary: 182 mg/dL — ABNORMAL HIGH (ref 70–99)

## 2024-06-01 NOTE — TOC Progression Note (Signed)
 Transition of Care [facility]) - Progression Note    Patient Details  Name: Jerry [name] MRN: [id number] Date of Birth: Aug 27, 1970  Transition of [facility]) CM/SW Contact  Nola Devere Hands, RN Phone Number: 06/01/2024, 10:40 AM  Clinical Narrative:    Case Manager contacted [facility] to arrange for patient transport to home. Bedside RN, Lucie updated, [facility] should arrive by 11am.    Expected Discharge Plan: Home w Home Health Services Barriers to Discharge: Continued Medical Work up               Expected Discharge Plan and Services   Discharge Planning Services: CM Consult Post Acute Care Choice: Home Health Living arrangements for the past 2 months: Single Family Home Expected Discharge Date: 06/01/24                                     Social Drivers of Health (SDOH) Interventions SDOH Screenings   Food Insecurity: No Food Insecurity (05/29/2024)  Housing: Low Risk  (05/29/2024)  Transportation Needs: No Transportation Needs (05/29/2024)  Utilities: Not At Risk (05/29/2024)  Alcohol  Screen: Low Risk  (09/04/2020)  Depression (PHQ2-9): Low Risk  (01/24/2024)  Financial Resource Strain: Low Risk  (09/29/2022)  Physical Activity: Inactive (10/11/2023)  Social Connections: Unknown (10/11/2023)  Stress: Stress Concern Present (10/11/2023)  Tobacco Use: Low Risk  (05/28/2024)  Health Literacy: Adequate Health Literacy (10/11/2023)    Readmission Risk Interventions    05/31/2024    9:25 AM 03/12/2024    4:10 PM  Readmission Risk Prevention Plan  Transportation Screening Complete Complete  PCP or Specialist Appt within 5-7 Days Complete Complete  Home Care Screening Complete Complete  Medication Review (RN CM) Complete Complete

## 2024-06-01 NOTE — Discharge Summary (Signed)
 [facility] Physician Discharge Summary   Patient name: Jerry [name]  Admit date:     05/28/2024  Discharge date: 06/01/2024  Attending Physician: Russel Morain, HASSAN [[id number]]  Discharge Physician: Norval Bar   PCP: [facility]  Admitted From: Home  Disposition:  Home with wife  Recommendations for Outpatient Follow-up:  Follow up with PCP in 1-2 weeks  Home Health:Yes Equipment/Devices: @ECDMELIST @  Discharge Condition:Stable CODE STATUS:FULL Diet recommendation: Enteral nutrition via PEG tube Fluid Restriction: None  Hospital Summary: 53 y/o M w/ PMH of ALS ventilator dependent, GJ tube, prediabetes, who presented w/ malfunction GJ tube malfunction. Pt has has had several admissions for GJ tube malfunction with the tube being replaced on 04/06/24. Of note, pt recently had acute cholecystitis for which a cholecystotomy tube was placed and supposed to be removed 11/24. IR was consulted, G tube replaced, and cholecystostomy drain was removed. Patient was resumed of tube feeds and tolerated them well. Stable for discharge home with family assistance.     Hospital Course by Problem: No notes have been filed under this hospital service. Service: Hospitalist   Discharge Diagnoses:  Principal Problem:   ALS (amyotrophic lateral sclerosis) (HCC) Active Problems:   Malfunction of gastrostomy tube (HCC)   Gastrostomy tube dysfunction [facility])   Discharge Instructions  Discharge Instructions     Discharge instructions   Complete by: As directed    Follow up with PCP within 2 weeks   Increase activity slowly   Complete by: As directed       Allergies as of 06/01/2024       Reactions   Crab [shellfish Allergy] Rash   Confirmed no allergy to fish with wife 02/28/24        Medication List     STOP taking these medications    doxycycline  100 MG capsule Commonly known as: VIBRAMYCIN    Potassium Chloride  40 MEQ/15ML (20%) Soln   pyridostigmine  60  MG/5ML solution Commonly known as: MESTINON        TAKE these medications    cetirizine  HCl 5 MG/5ML Soln Commonly known as: Zyrtec  Take 10 mLs (10 mg total) by mouth at bedtime. What changed: how to take this   Clear Eyes Natural Tears 5-6 MG/ML Soln Generic drug: Polyvinyl Alcohol -Povidone Apply 1 drop to eye 3 (three) times daily as needed. What changed:  how to take this when to take this reasons to take this   free water  Soln Place 125 mLs into feeding tube every 6 (six) hours. What changed: when to take this   glycopyrrolate  0.2 mg/ml Soln Commonly known as: CUVPOSA  Place 5 mLs (1,000 mcg total) into feeding tube 2 (two) times daily.   ipratropium-albuterol  0.5-2.5 (3) MG/3ML Soln Commonly known as: DUONEB USE 3 ML VIA NEBULIZER EVERY 4 HOURS AS NEEDED What changed: See the new instructions.   metoCLOPramide  5 MG/5ML solution Commonly known as: REGLAN  Place 10 mLs (10 mg total) into feeding tube every 8 (eight) hours as needed for nausea.   polyethylene glycol powder 17 GM/SCOOP powder Commonly known as: GLYCOLAX /MIRALAX  Place 17 g into feeding tube daily. Titrate as needed for constipation What changed:  when to take this reasons to take this additional instructions   scopolamine  1 MG/3DAYS Commonly known as: TRANSDERM-SCOP PLACE 1 PATCH ONTO THE SKIN EVERY 3 DAYS What changed: See the new instructions.   Simethicone  40 MG/0.6ML Liqd 1.2 mLs (80 mg total) by Per J Tube route in the morning, at noon, in the evening, and at bedtime. What  changed:  how to take this when to take this   sodium chloride  flush 0.9 % Soln Commonly known as: NS 10-40 mLs by Intracatheter route every 12 (twelve) hours.               Durable Medical Equipment  (From admission, onward)           Start     Ordered   05/31/24 0923  For home use only DME Other see comment  Once       Comments: Please provide incontinence supplies - including condom cath supplies.   Question:  Length of Need  Answer:  6 Months   05/31/24 0925            Follow-up Information     [facility] Follow up.   Why: [facility] will continue to provide private duty nursing for the patient.        [facility] Follow up.   Specialty: Home Health Services Why: [facility] will provide home health services for RN. Contact information: [address] [place] [address] [phone]                Allergies  Allergen Reactions   Georgianne Cerise Allergy] Rash    Confirmed no allergy to fish with wife 02/28/24    Discharge Exam: Vitals:   06/01/24 0700 06/01/24 0800  BP: 121/86 121/86  Pulse: 87 82  Resp: 17 16  Temp:  98.8 F (37.1 C)  SpO2: 99% 99%    Physical Exam Vitals and nursing note reviewed.  Constitutional:      General: He is not in acute distress.    Appearance: He is ill-appearing (Chronically).     Comments: Nonverbal at baseline  Cardiovascular:     Rate and Rhythm: Normal rate and regular rhythm.     Pulses: Normal pulses.     Heart sounds: Normal heart sounds.  Pulmonary:     Effort: Pulmonary effort is normal.     Breath sounds: Normal breath sounds.  Abdominal:     General: Bowel sounds are normal.     Palpations: Abdomen is soft.  Neurological:     Mental Status: He is alert.     The results of significant diagnostics from this hospitalization (including imaging, microbiology, ancillary and laboratory) are listed below for reference.    Microbiology: Recent Results (from the past 240 hours)  MRSA Next Gen by PCR, Nasal     Status: None   Collection Time: 05/29/24  4:11 AM   Specimen: Nasal Mucosa; Nasal Swab  Result Value Ref Range Status   MRSA by PCR Next Gen NOT DETECTED NOT DETECTED Final    Comment: (NOTE) The GeneXpert MRSA Assay (FDA approved for NASAL specimens only), is one component of a comprehensive MRSA colonization surveillance program. It is not intended  to diagnose MRSA infection nor to guide or monitor treatment for MRSA infections. Test performance is not FDA approved in patients less than 17 years old. Performed at [facility], [address] [place], [place] [address]      Labs: ProBNP, BNP (last 5 results) No results for input(s): PROBNP, BNP in the last 8760 hours. Basic Metabolic Panel: Recent Labs  Lab 05/28/24 2026 05/29/24 0421 05/30/24 1647  NA 140 141 138  K 3.6 3.3* 3.5  CL 106 110 109  CO2 21* 18* 18*  GLUCOSE 97 76 158*  BUN 9 8 8  CREATININE <0.30* <0.30* <0.30*  CALCIUM  10.2 9.9 9.3   Liver Function Tests: No results for input(s): AST, ALT, ALKPHOS, BILITOT, PROT, ALBUMIN in the last 168 hours. No results for input(s): LIPASE, AMYLASE in the last 168 hours. No results for input(s): AMMONIA in the last 168 hours. CBC: Recent Labs  Lab 05/28/24 1537 05/29/24 0421 05/30/24 1647  WBC 7.8 7.6 6.1  NEUTROABS 5.3  --   --   HGB 9.2* 9.8* 9.7*  HCT 30.2* 31.9* 31.7*  MCV 79.3* 77.8* 79.1*  PLT 216 273 262   Cardiac Enzymes: No results for input(s): CKTOTAL, CKMB, CKMBINDEX, TROPONINI, TROPONINIHS in the last 168 hours. BNP: No results for input(s): BNP in the last 168 hours. CBG: Recent Labs  Lab 05/31/24 1517 05/31/24 1923 05/31/24 2323 06/01/24 0323 06/01/24 0740  GLUCAP 170* 161* 125* 159* 182*   D-Dimer No results for input(s): DDIMER in the last 72 hours. Hgb A1c No results for input(s): HGBA1C in the last 72 hours. Lipid Profile No results for input(s): CHOL, HDL, LDLCALC, TRIG, CHOLHDL, LDLDIRECT in the last 72 hours. Thyroid function studies No results for input(s): TSH, T4TOTAL, FREET4, T3FREE, THYROIDAB in the last 72 hours.  Invalid input(s): FREET3 Anemia work up No results for input(s): VITAMINB12, FOLATE, FERRITIN, TIBC, IRON, RETICCTPCT in the last 72 hours. Urinalysis    Component Value  Date/Time   COLORURINE AMBER (A) 02/26/2024 0343   APPEARANCEUR HAZY (A) 02/26/2024 0343   LABSPEC >1.046 (H) 02/26/2024 0343   PHURINE 7.0 02/26/2024 0343   GLUCOSEU 50 (A) 02/26/2024 0343   HGBUR NEGATIVE 02/26/2024 0343   BILIRUBINUR NEGATIVE 02/26/2024 0343   KETONESUR 5 (A) 02/26/2024 0343   PROTEINUR 30 (A) 02/26/2024 0343   NITRITE NEGATIVE 02/26/2024 0343   LEUKOCYTESUR MODERATE (A) 02/26/2024 0343   Sepsis Labs Recent Labs  Lab 05/28/24 1537 05/29/24 0421 05/30/24 1647  WBC 7.8 7.6 6.1    Procedures/Studies: IR GJ Tube Change Result Date: 05/29/2024 INDICATION: 53 year old male with history of acute cholecystitis status post percutaneous cholecystostomy tube placement on 02/26/2024 in addition to chronic indwelling gastrojejunostomy tube, presenting with clogged jejunal arm. EXAM: 1. FLUOROSCOPIC GUIDED REPLACEMENT OF GASTROJEJUNOSTOMY TUBE 2. Cholecystostomy tube check. COMPARISON:  02/29/2024, 02/26/2024 MEDICATIONS: None. CONTRAST:  55mL OMNIPAQUE  IOHEXOL  300 MG/ML  SOLN FLUOROSCOPY TIME:  Forty-five mGy reference air kerma COMPLICATIONS: None. PROCEDURE: Informed written consent was obtained from patient's family member after a discussion of the risks and benefits. The upper abdomen and the external portion of the existing gastrojejunostomy tube was prepped and draped in the usual sterile fashion, and a sterile drape was applied covering the operative field. Maximum barrier sterile technique with sterile gowns and gloves were used for the procedure. A timeout was performed prior to the initiation of the procedure. Contrast injection was performed demonstrated patency of the jejunal and gastric ports. A stiff glidewire was inserted via the jejunal arm and was unable to be passed the region of the retention balloon. Therefore, a 4 French Navicross catheter was used to assist in traversing this stenotic region and the Glidewire was advanced to the proximal jejunum. Under  intermittent fluoroscopic guidance, a new 24 gastrojejunostomy catheter was advanced through the established access site with tip ultimately terminating within the proximal small bowel. Contrast injection via the jejunostomy and gastric lumens confirmed appropriate functioning and positioning. A dressing was placed. Contrast was then injected via the indwelling percutaneous cholecystostomy tube. The gallbladder lumen demonstrated diffuse irregularity. There is patency of the cystic and  common bile duct which drains into the duodenum. The pigtail portion of the catheter was retracted from the infundibulum to the body of the gallbladder. The drain was capped. The patient tolerated procedure well without immediate postprocedural complication. IMPRESSION: 1. Successful fluoroscopic guided replacement of occluded 24 French gastrojejunostomy catheter. The tip of the jejunostomy lumen lies within the proximal jejunum. Both lumens ready for immediate use. 2. Mucosal irregularity throughout the gallbladder lumen with patent appearing cystic and common bile ducts. The drain was capped in hopes of possible removal if capping trial as tolerated. Ester Sides, MD [facility] Electronically Signed   By: Ester Sides M.D.   On: 05/29/2024 15:53   IR CHOLANGIOGRAM EXISTING TUBE Result Date: 05/29/2024 INDICATION: 53 year old male with history of acute cholecystitis status post percutaneous cholecystostomy tube placement on 02/26/2024 in addition to chronic indwelling gastrojejunostomy tube, presenting with clogged jejunal arm. EXAM: 1. FLUOROSCOPIC GUIDED REPLACEMENT OF GASTROJEJUNOSTOMY TUBE 2. Cholecystostomy tube check. COMPARISON:  02/29/2024, 02/26/2024 MEDICATIONS: None. CONTRAST:  55mL OMNIPAQUE  IOHEXOL  300 MG/ML  SOLN FLUOROSCOPY TIME:  Forty-five mGy reference air kerma COMPLICATIONS: None. PROCEDURE: Informed written consent was obtained from patient's family  member after a discussion of the risks and benefits. The upper abdomen and the external portion of the existing gastrojejunostomy tube was prepped and draped in the usual sterile fashion, and a sterile drape was applied covering the operative field. Maximum barrier sterile technique with sterile gowns and gloves were used for the procedure. A timeout was performed prior to the initiation of the procedure. Contrast injection was performed demonstrated patency of the jejunal and gastric ports. A stiff glidewire was inserted via the jejunal arm and was unable to be passed the region of the retention balloon. Therefore, a 4 French Navicross catheter was used to assist in traversing this stenotic region and the Glidewire was advanced to the proximal jejunum. Under intermittent fluoroscopic guidance, a new 24 gastrojejunostomy catheter was advanced through the established access site with tip ultimately terminating within the proximal small bowel. Contrast injection via the jejunostomy and gastric lumens confirmed appropriate functioning and positioning. A dressing was placed. Contrast was then injected via the indwelling percutaneous cholecystostomy tube. The gallbladder lumen demonstrated diffuse irregularity. There is patency of the cystic and common bile duct which drains into the duodenum. The pigtail portion of the catheter was retracted from the infundibulum to the body of the gallbladder. The drain was capped. The patient tolerated procedure well without immediate postprocedural complication. IMPRESSION: 1. Successful fluoroscopic guided replacement of occluded 24 French gastrojejunostomy catheter. The tip of the jejunostomy lumen lies within the proximal jejunum. Both lumens ready for immediate use. 2. Mucosal irregularity throughout the gallbladder lumen with patent appearing cystic and common bile ducts. The drain was capped in hopes of possible removal if capping trial as tolerated. Ester Sides, MD [facility] Electronically Signed   By: Ester Sides M.D.   On: 05/29/2024 15:53   DG Abd Portable 1V Result Date: 05/28/2024 EXAM: 1 VIEW XRAY OF THE ABDOMEN 05/28/2024 07:32:00 PM COMPARISON: 04/06/2024 CLINICAL HISTORY: Abdominal distention FINDINGS: LINES, TUBES AND DEVICES: Percutaneous pigtail catheter in right upper quadrant. Gastrojejunostomy tube with tip in distal duodenum. BOWEL: Scattered large and small bowel gas is noted without true obstructive change. This may represent an ileus. SOFT TISSUES: No opaque urinary calculi. BONES: No acute osseous abnormality. IMPRESSION: 1. Scattered large and small bowel gas without true obstructive change, possibly representing an  ileus. Electronically signed by: Oneil Devonshire MD 05/28/2024 09:59 PM EST RP Workstation: [id number]   DG Chest Portable 1 View Result Date: 05/28/2024 EXAM: 1 VIEW(S) XRAY OF THE CHEST 05/28/2024 02:47:00 PM COMPARISON: 02/26/2024 CLINICAL HISTORY: Vent pt, ? JG tube malfunction FINDINGS: LINES, TUBES AND DEVICES: Tracheostomy tube in place with tip about 3.6 cm above the carina. Right chest wall dual-lumen port catheter in place with tip overlying right atrium. Left PICC removed. Incompletely visualized gastrojejunal tube in the upper abdomen LUNGS AND PLEURA: Low lung volumes. No focal pulmonary opacity. No pleural effusion. No pneumothorax. HEART AND MEDIASTINUM: No acute abnormality of the cardiac and mediastinal silhouettes. BONES AND SOFT TISSUES: No acute osseous abnormality. IMPRESSION: 1. No acute cardiopulmonary process. Electronically signed by: Luke Bun MD 05/28/2024 03:19 PM EST RP Workstation: [id number]    Time coordinating discharge: 40 mins  SIGNED:  Norval Bar, MD [facility] 06/01/24, 10:29 AM

## 2024-06-01 NOTE — Procedures (Signed)
 Tracheostomy Change Note  Patient Details:   Name: Jerry [name] DOB: 16-Apr-1971 MRN: [id number]    Airway Documentation:     Evaluation  O2 sats: stable throughout Complications: No apparent complications Patient did tolerate procedure well. Bilateral Breath Sounds: Clear, Diminished   A #8 Shiley flex trach tube was changed without difficulty.  Positive color change noted on CO2 detector and bilateral breath sounds noted post trach tube change.  Jerry [name] 06/01/2024, 8:59 AM

## 2024-07-02 ENCOUNTER — Emergency Department (HOSPITAL_COMMUNITY)

## 2024-07-02 ENCOUNTER — Inpatient Hospital Stay (HOSPITAL_COMMUNITY)
Admission: EM | Admit: 2024-07-02 | Discharge: 2024-07-07 | DRG: 637 | Disposition: A | Discharge status: Home Health | Attending: Internal Medicine | Admitting: Internal Medicine

## 2024-07-02 ENCOUNTER — Other Ambulatory Visit: Payer: Self-pay

## 2024-07-02 ENCOUNTER — Encounter (HOSPITAL_COMMUNITY): Payer: Self-pay

## 2024-07-02 DIAGNOSIS — Z9889 Other specified postprocedural states: Secondary | ICD-10-CM

## 2024-07-02 DIAGNOSIS — E860 Dehydration: Secondary | ICD-10-CM | POA: Diagnosis not present

## 2024-07-02 DIAGNOSIS — G1221 Amyotrophic lateral sclerosis: Secondary | ICD-10-CM | POA: Diagnosis not present

## 2024-07-02 DIAGNOSIS — Z8719 Personal history of other diseases of the digestive system: Secondary | ICD-10-CM

## 2024-07-02 DIAGNOSIS — K810 Acute cholecystitis: Secondary | ICD-10-CM

## 2024-07-02 DIAGNOSIS — Z9911 Dependence on respirator [ventilator] status: Secondary | ICD-10-CM

## 2024-07-02 DIAGNOSIS — E861 Hypovolemia: Secondary | ICD-10-CM | POA: Diagnosis present

## 2024-07-02 DIAGNOSIS — Z8619 Personal history of other infectious and parasitic diseases: Secondary | ICD-10-CM

## 2024-07-02 DIAGNOSIS — I471 Supraventricular tachycardia, unspecified: Secondary | ICD-10-CM | POA: Diagnosis present

## 2024-07-02 DIAGNOSIS — Z91013 Allergy to seafood: Secondary | ICD-10-CM

## 2024-07-02 DIAGNOSIS — R739 Hyperglycemia, unspecified: Secondary | ICD-10-CM

## 2024-07-02 DIAGNOSIS — Z8249 Family history of ischemic heart disease and other diseases of the circulatory system: Secondary | ICD-10-CM

## 2024-07-02 DIAGNOSIS — I493 Ventricular premature depolarization: Secondary | ICD-10-CM | POA: Diagnosis present

## 2024-07-02 DIAGNOSIS — K117 Disturbances of salivary secretion: Secondary | ICD-10-CM | POA: Diagnosis present

## 2024-07-02 DIAGNOSIS — J9610 Chronic respiratory failure, unspecified whether with hypoxia or hypercapnia: Secondary | ICD-10-CM | POA: Diagnosis present

## 2024-07-02 DIAGNOSIS — Z794 Long term (current) use of insulin: Secondary | ICD-10-CM

## 2024-07-02 DIAGNOSIS — Z7401 Bed confinement status: Secondary | ICD-10-CM

## 2024-07-02 DIAGNOSIS — Z8661 Personal history of infections of the central nervous system: Secondary | ICD-10-CM

## 2024-07-02 DIAGNOSIS — Z8269 Family history of other diseases of the musculoskeletal system and connective tissue: Secondary | ICD-10-CM

## 2024-07-02 DIAGNOSIS — Z8616 Personal history of COVID-19: Secondary | ICD-10-CM

## 2024-07-02 DIAGNOSIS — K819 Cholecystitis, unspecified: Principal | ICD-10-CM | POA: Diagnosis present

## 2024-07-02 DIAGNOSIS — R7401 Elevation of levels of liver transaminase levels: Secondary | ICD-10-CM | POA: Diagnosis not present

## 2024-07-02 DIAGNOSIS — N179 Acute kidney failure, unspecified: Secondary | ICD-10-CM | POA: Diagnosis not present

## 2024-07-02 DIAGNOSIS — G8250 Quadriplegia, unspecified: Secondary | ICD-10-CM | POA: Diagnosis present

## 2024-07-02 DIAGNOSIS — T85590A Other mechanical complication of bile duct prosthesis, initial encounter: Secondary | ICD-10-CM | POA: Diagnosis present

## 2024-07-02 DIAGNOSIS — Y848 Other medical procedures as the cause of abnormal reaction of the patient, or of later complication, without mention of misadventure at the time of the procedure: Secondary | ICD-10-CM | POA: Diagnosis present

## 2024-07-02 DIAGNOSIS — Z930 Tracheostomy status: Secondary | ICD-10-CM | POA: Diagnosis not present

## 2024-07-02 DIAGNOSIS — K8000 Calculus of gallbladder with acute cholecystitis without obstruction: Secondary | ICD-10-CM | POA: Diagnosis present

## 2024-07-02 DIAGNOSIS — N200 Calculus of kidney: Secondary | ICD-10-CM | POA: Diagnosis present

## 2024-07-02 DIAGNOSIS — Z79899 Other long term (current) drug therapy: Secondary | ICD-10-CM

## 2024-07-02 DIAGNOSIS — K823 Fistula of gallbladder: Secondary | ICD-10-CM | POA: Diagnosis present

## 2024-07-02 DIAGNOSIS — E119 Type 2 diabetes mellitus without complications: Secondary | ICD-10-CM

## 2024-07-02 DIAGNOSIS — N319 Neuromuscular dysfunction of bladder, unspecified: Secondary | ICD-10-CM | POA: Diagnosis present

## 2024-07-02 DIAGNOSIS — E1110 Type 2 diabetes mellitus with ketoacidosis without coma: Principal | ICD-10-CM | POA: Diagnosis present

## 2024-07-02 DIAGNOSIS — Z934 Other artificial openings of gastrointestinal tract status: Secondary | ICD-10-CM

## 2024-07-02 DIAGNOSIS — K5900 Constipation, unspecified: Secondary | ICD-10-CM | POA: Diagnosis present

## 2024-07-02 DIAGNOSIS — E870 Hyperosmolality and hypernatremia: Secondary | ICD-10-CM | POA: Diagnosis present

## 2024-07-02 DIAGNOSIS — Z1152 Encounter for screening for COVID-19: Secondary | ICD-10-CM

## 2024-07-02 DIAGNOSIS — Z95828 Presence of other vascular implants and grafts: Secondary | ICD-10-CM

## 2024-07-02 DIAGNOSIS — Z833 Family history of diabetes mellitus: Secondary | ICD-10-CM

## 2024-07-02 DIAGNOSIS — Y828 Other medical devices associated with adverse incidents: Secondary | ICD-10-CM | POA: Diagnosis present

## 2024-07-02 LAB — URINALYSIS, W/ REFLEX TO CULTURE (INFECTION SUSPECTED)
Bacteria, UA: NONE SEEN
Bilirubin Urine: NEGATIVE
Glucose, UA: >=500 mg/dL — AB
Hgb urine dipstick: NEGATIVE
Ketones, ur: 80 mg/dL — AB
Leukocytes,Ua: NEGATIVE
Nitrite: NEGATIVE
Protein, ur: >=300 mg/dL — AB
Specific Gravity, Urine: 1.033 — ABNORMAL HIGH (ref 1.005–1.030)
pH: 5 (ref 5.0–8.0)

## 2024-07-02 LAB — COMPREHENSIVE METABOLIC PANEL WITH GFR
ALT: 177 U/L — ABNORMAL HIGH (ref 0–44)
AST: 60 U/L — ABNORMAL HIGH (ref 15–41)
Albumin: 4.5 g/dL (ref 3.5–5.0)
Alkaline Phosphatase: 497 U/L — ABNORMAL HIGH (ref 38–126)
Anion gap: 26 — ABNORMAL HIGH (ref 5–15)
BUN: 19 mg/dL (ref 6–20)
CO2: 13 mmol/L — ABNORMAL LOW (ref 22–32)
Calcium: 10.8 mg/dL — ABNORMAL HIGH (ref 8.9–10.3)
Chloride: 116 mmol/L — ABNORMAL HIGH (ref 98–111)
Creatinine, Ser: 0.65 mg/dL (ref 0.61–1.24)
GFR, Estimated: >60 mL/min
Glucose, Bld: 400 mg/dL — ABNORMAL HIGH (ref 70–99)
Potassium: 4.3 mmol/L (ref 3.5–5.1)
Sodium: 155 mmol/L — ABNORMAL HIGH (ref 135–145)
Total Bilirubin: 1.3 mg/dL — ABNORMAL HIGH (ref 0.0–1.2)
Total Protein: 9.8 g/dL — ABNORMAL HIGH (ref 6.5–8.1)

## 2024-07-02 LAB — I-STAT VENOUS BLOOD GAS, ED
Acid-base deficit: 8 mmol/L — ABNORMAL HIGH (ref 0.0–2.0)
Bicarbonate: 14.3 mmol/L — ABNORMAL LOW (ref 20.0–28.0)
Calcium, Ion: 1.24 mmol/L (ref 1.15–1.40)
HCT: 43 % (ref 39.0–52.0)
Hemoglobin: 14.6 g/dL (ref 13.0–17.0)
O2 Saturation: 99 %
Potassium: 3.7 mmol/L (ref 3.5–5.1)
Sodium: 159 mmol/L — ABNORMAL HIGH (ref 135–145)
TCO2: 15 mmol/L — ABNORMAL LOW (ref 22–32)
pCO2, Ven: 22.5 mmHg — ABNORMAL LOW (ref 44–60)
pH, Ven: 7.411 (ref 7.25–7.43)
pO2, Ven: 143 mmHg — ABNORMAL HIGH (ref 32–45)

## 2024-07-02 LAB — CBC WITH DIFFERENTIAL/PLATELET
Abs Immature Granulocytes: 0.04 K/uL (ref 0.00–0.07)
Basophils Absolute: 0.1 K/uL (ref 0.0–0.1)
Basophils Relative: 1 %
Eosinophils Absolute: 0.1 K/uL (ref 0.0–0.5)
Eosinophils Relative: 1 %
HCT: 47.1 % (ref 39.0–52.0)
Hemoglobin: 14.1 g/dL (ref 13.0–17.0)
Immature Granulocytes: 0 %
Lymphocytes Relative: 23 %
Lymphs Abs: 2.4 K/uL (ref 0.7–4.0)
MCH: 25.1 pg — ABNORMAL LOW (ref 26.0–34.0)
MCHC: 29.9 g/dL — ABNORMAL LOW (ref 30.0–36.0)
MCV: 84 fL (ref 80.0–100.0)
Monocytes Absolute: 0.5 K/uL (ref 0.1–1.0)
Monocytes Relative: 5 %
Neutro Abs: 7.6 K/uL (ref 1.7–7.7)
Neutrophils Relative %: 70 %
Platelets: 163 K/uL (ref 150–400)
RBC: 5.61 MIL/uL (ref 4.22–5.81)
RDW: 20.7 % — ABNORMAL HIGH (ref 11.5–15.5)
WBC: 10.7 K/uL — ABNORMAL HIGH (ref 4.0–10.5)
nRBC: 0 % (ref 0.0–0.2)

## 2024-07-02 LAB — CBG MONITORING, ED
Glucose-Capillary: 281 mg/dL — ABNORMAL HIGH (ref 70–99)
Glucose-Capillary: 296 mg/dL — ABNORMAL HIGH (ref 70–99)

## 2024-07-02 LAB — I-STAT CG4 LACTIC ACID, ED
Lactic Acid, Venous: 1.3 mmol/L (ref 0.5–1.9)
Lactic Acid, Venous: 1.1 mmol/L (ref 0.5–1.9)

## 2024-07-02 LAB — RESP PANEL BY RT-PCR (RSV, FLU A&B, COVID)  RVPGX2
Influenza A by PCR: NEGATIVE
Influenza B by PCR: NEGATIVE
Resp Syncytial Virus by PCR: NEGATIVE
SARS Coronavirus 2 by RT PCR: NEGATIVE

## 2024-07-02 LAB — I-STAT CHEM 8, ED
BUN: 21 mg/dL — ABNORMAL HIGH (ref 6–20)
Calcium, Ion: 1.29 mmol/L (ref 1.15–1.40)
Chloride: 123 mmol/L — ABNORMAL HIGH (ref 98–111)
Creatinine, Ser: <0.2 mg/dL — ABNORMAL LOW (ref 0.61–1.24)
Glucose, Bld: 414 mg/dL — ABNORMAL HIGH (ref 70–99)
HCT: 45 % (ref 39.0–52.0)
Hemoglobin: 15.3 g/dL (ref 13.0–17.0)
Potassium: 4.1 mmol/L (ref 3.5–5.1)
Sodium: 159 mmol/L — ABNORMAL HIGH (ref 135–145)
TCO2: 16 mmol/L — ABNORMAL LOW (ref 22–32)

## 2024-07-02 LAB — PROTIME-INR
INR: 1 (ref 0.8–1.2)
Prothrombin Time: 13.6 s (ref 11.4–15.2)

## 2024-07-02 LAB — BETA-HYDROXYBUTYRIC ACID: Beta-Hydroxybutyric Acid: 6.2 mmol/L — ABNORMAL HIGH (ref 0.05–0.27)

## 2024-07-02 LAB — TROPONIN T, HIGH SENSITIVITY: Troponin T High Sensitivity: 77 ng/L — ABNORMAL HIGH (ref 0–19)

## 2024-07-02 LAB — OSMOLALITY: Osmolality: 368 mosm/kg (ref 275–295)

## 2024-07-02 MED ORDER — SODIUM CHLORIDE 0.9 % IV BOLUS
1000.0000 mL | Freq: Once | INTRAVENOUS | Status: AC
  Administered 2024-07-02: 1000 mL via INTRAVENOUS

## 2024-07-02 MED ORDER — PIPERACILLIN-TAZOBACTAM 3.375 G IVPB
3.3750 g | Freq: Three times a day (TID) | INTRAVENOUS | Status: DC
  Administered 2024-07-03 – 2024-07-06 (×10): 3.375 g via INTRAVENOUS
  Filled 2024-07-02 (×10): qty 50

## 2024-07-02 MED ORDER — PIPERACILLIN-TAZOBACTAM 3.375 G IVPB 30 MIN
3.3750 g | Freq: Once | INTRAVENOUS | Status: AC
  Administered 2024-07-02: 3.375 g via INTRAVENOUS
  Filled 2024-07-02: qty 50

## 2024-07-02 MED ORDER — IPRATROPIUM-ALBUTEROL 0.5-2.5 (3) MG/3ML IN SOLN
3.0000 mL | Freq: Once | RESPIRATORY_TRACT | Status: AC
  Administered 2024-07-03: 3 mL via RESPIRATORY_TRACT
  Filled 2024-07-02: qty 3

## 2024-07-02 MED ORDER — DEXTROSE 50 % IV SOLN: 0.0000 mL | INTRAVENOUS | Status: DC | PRN

## 2024-07-02 MED ORDER — DEXTROSE IN LACTATED RINGERS 5 % IV SOLN: INTRAVENOUS | Status: DC

## 2024-07-02 MED ORDER — LACTATED RINGERS IV SOLN: INTRAVENOUS | Status: DC

## 2024-07-02 MED ORDER — ONDANSETRON HCL 4 MG/2ML IJ SOLN: 4.0000 mg | Freq: Four times a day (QID) | INTRAMUSCULAR | Status: DC | PRN

## 2024-07-02 MED ORDER — IPRATROPIUM-ALBUTEROL 0.5-2.5 (3) MG/3ML IN SOLN
3.0000 mL | RESPIRATORY_TRACT | Status: DC | PRN
  Administered 2024-07-07: 3 mL via RESPIRATORY_TRACT
  Filled 2024-07-02 (×3): qty 3

## 2024-07-02 MED ORDER — POTASSIUM CHLORIDE 10 MEQ/100ML IV SOLN
10.0000 meq | INTRAVENOUS | Status: AC
  Administered 2024-07-02 – 2024-07-03 (×2): 10 meq via INTRAVENOUS
  Filled 2024-07-02 (×2): qty 100

## 2024-07-02 MED ORDER — LACTATED RINGERS IV BOLUS: 20.0000 mL/kg | Freq: Once | INTRAVENOUS | Status: DC

## 2024-07-02 MED ORDER — INSULIN REGULAR(HUMAN) IN NACL 100-0.9 UT/100ML-% IV SOLN
INTRAVENOUS | Status: DC
  Administered 2024-07-02: 16 [IU]/h via INTRAVENOUS
  Filled 2024-07-02: qty 100

## 2024-07-02 NOTE — Consult Note (Signed)
 "  NAME:  Jerry [name], MRN:  [id number], DOB:  04/02/1971, LOS: 0 ADMISSION DATE:  07/02/2024, CONSULTATION DATE:  12/29 REFERRING MD:  Dr. Lenor, CHIEF COMPLAINT:  vent management; acute cholecystitis   History of Present Illness:  Patient is a 53 year old male with pertinent PMH advanced ALS with trach/ventilator dependence, prediabetes presents to [facility] on 12/29 with acute cholecystitis.  Patient also recently had previous acute cholecystitis requiring PERC Choley tube to be placed which was removed in November.  On 12/29 patient presents to [facility] with dry mouth and fatigue.  On arrival patient afebrile tachycardia 110s.  Glucose 414, NA 155, CO2 13, AG 26, OSM 368.  pH on VBG 7.41.  Concern for DKA patient started on insulin  drip and given IV fluids.  WBC mildly elevated at 10 and LA WNL.  LFTs elevated.  CT ABD/pelvis showing concern for acute cholecystitis.  Cultures obtained and started on IV Zosyn .  Surgery consulted recommended IR consult.  TRH to admit and PCCM to consult for chronic vent management.  Pertinent  Medical History   Past Medical History:  Diagnosis Date   ALS (amyotrophic lateral sclerosis) (HCC)    COVID-19 virus infection 06/2020   Meningitis 2003   spinal   Morbid obesity (HCC)    Neuromuscular disorder (HCC)    Prediabetes 11/05/2016   A1C 5.7 on 11/05/16   Type 2 diabetes mellitus (HCC) 08/31/2023     Significant Hospital Events: Including procedures, antibiotic start and stop dates in addition to other pertinent events   12/29 admitted with cholecystitis; PCCM consulted for vent management  Interim History / Subjective:  See above  Objective    Blood pressure (!) 136/92, pulse 97, temperature 99.4 F (37.4 C), temperature source Rectal, resp. rate 16, height 5' 5 (1.651 m), SpO2 100%.    Vent Mode: AC FiO2 (%):  [28 %] 28 % Set Rate:  [16 bmp] 16 bmp Vt Set:  [500 mL] 500 mL PEEP:  [5 cmH20] 5 cmH20 Plateau Pressure:  [17 cmH20-21 cmH20] 17 cmH20    Intake/Output Summary (Last 24 hours) at 07/02/2024 2300 Last data filed at 07/02/2024 2243 Gross per 24 hour  Intake 1050 ml  Output --  Net 1050 ml   There were no vitals filed for this visit.  Examination: General:  chronically ill appearing male on mech vent in NAD HEENT: MM pink/dry; trach in place Neuro: alert able to communicate using tablet CV: s1s2, RRR, no m/r/g PULM:  dim clear BS bilaterally; on home vent sats 100% GI: soft, bsx4 active     Resolved problem list   Assessment and Plan   Chronic respiratory failure with tracheostomy and ventilator dependence: In setting of advanced ALS Plan: -Continue home vent settings -VAP bundle in place -Trach care per protocol -trach suction prn -duoneb prn -would recommend holding home scopolamine  and glycopyrrolate  for now w/ dry mouth/secretions  Acute cholecystitis Possible DKA Hypernatremia Hypercalcemia Elevated LFTs Plan: -per primary    Labs   CBC: Recent Labs  Lab 07/02/24 1804 07/02/24 1822 07/02/24 2026  WBC 10.7*  --   --   NEUTROABS 7.6  --   --   HGB 14.1 15.3 14.6  HCT 47.1 45.0 43.0  MCV 84.0  --   --   PLT 163  --   --     Basic Metabolic Panel: Recent Labs  Lab 07/02/24 1804 07/02/24 1822 07/02/24 2026  NA 155* 159* 159*  K 4.3 4.1 3.7  CL 116*  123*  --   CO2 13*  --   --   GLUCOSE 400* 414*  --   BUN 19 21*  --   CREATININE 0.65 <0.20*  --   CALCIUM  10.8*  --   --    GFR: CrCl cannot be calculated (This lab value cannot be used to calculate CrCl because it is not a number: <0.20). Recent Labs  Lab 07/02/24 1804 07/02/24 1821 07/02/24 2027  WBC 10.7*  --   --   LATICACIDVEN  --  1.3 1.1    Liver Function Tests: Recent Labs  Lab 07/02/24 1804  AST 60*  ALT 177*  ALKPHOS 497*  BILITOT 1.3*  PROT 9.8*  ALBUMIN 4.5   No results for input(s): LIPASE, AMYLASE in the last 168 hours. No results for input(s): AMMONIA in the last 168 hours.  ABG     Component Value Date/Time   PHART 7.52 (H) 01/09/2022 1810   PCO2ART 26 (L) 01/09/2022 1810   PO2ART 66 (L) 01/09/2022 1810   HCO3 14.3 (L) 07/02/2024 2026   TCO2 15 (L) 07/02/2024 2026   ACIDBASEDEF 8.0 (H) 07/02/2024 2026   O2SAT 99 07/02/2024 2026     Coagulation Profile: Recent Labs  Lab 07/02/24 1804  INR 1.0    Cardiac Enzymes: No results for input(s): CKTOTAL, CKMB, CKMBINDEX, TROPONINI in the last 168 hours.  HbA1C: Hgb A1c MFr Bld  Date/Time Value Ref Range Status  02/26/2024 06:28 PM 5.2 4.8 - 5.6 % Final    Comment:    (NOTE) Diagnosis of Diabetes The following HbA1c ranges recommended by the American Diabetes Association (ADA) may be used as an aid in the diagnosis of diabetes mellitus.  Hemoglobin             Suggested A1C NGSP%              Diagnosis  <5.7                   Non Diabetic  5.7-6.4                Pre-Diabetic  >6.4                   Diabetic  <7.0                   Glycemic control for                       adults with diabetes.    12/09/2021 11:05 AM 5.4 4.8 - 5.6 % Final    Comment:    (NOTE) Pre diabetes:          5.7%-6.4%  Diabetes:              >6.4%  Glycemic control for   <7.0% adults with diabetes     CBG: Recent Labs  Lab 07/02/24 2209 07/02/24 2258  GLUCAP 281* 296*     Past Medical History:  He,  has a past medical history of ALS (amyotrophic lateral sclerosis) (HCC), COVID-19 virus infection (06/2020), Meningitis (2003), Morbid obesity (HCC), Neuromuscular disorder (HCC), Prediabetes (11/05/2016), and Type 2 diabetes mellitus (HCC) (08/31/2023).   Surgical History:   Past Surgical History:  Procedure Laterality Date   IR CHOLANGIOGRAM EXISTING TUBE  02/29/2024   IR CHOLANGIOGRAM EXISTING TUBE  05/29/2024   IR GJ TUBE CHANGE  03/01/2024   IR GJ TUBE CHANGE  04/06/2024   IR GJ TUBE CHANGE  05/29/2024   IR PERC CHOLECYSTOSTOMY  02/26/2024   IR REPLACE G-TUBE SIMPLE WO FLUORO  06/22/2022    MASS EXCISION Right 11/26/2014   Procedure: EXCISION MASS/GROIN EXCISION MASS;  Surgeon: Reyes LELON Cota, MD;  Location: [facility] ORS;  Service: General;  Laterality: Right;   MASS EXCISION Right 11/26/2014   Procedure: MINOR EXCISION OF MASS/POST THIGH MASS;  Surgeon: Reyes LELON Cota, MD;  Location: [facility] ORS;  Service: General;  Laterality: Right;   PEG PLACEMENT  10/27/2021   PORTACATH PLACEMENT Right 06/19/2019     Social History:   reports that he has never smoked. He has never used smokeless tobacco. He reports that he does not drink alcohol  and does not use drugs.   Family History:  His family history includes Diabetes in his brother, father, mother, sister, sister, and sister; Fibromyalgia in his sister; Hypertension in his sister.   Allergies Allergies[1]   Home Medications  Prior to Admission medications  Medication Sig Start Date End Date Taking? Authorizing Provider  cetirizine  HCl (ZYRTEC ) 5 MG/5ML SOLN Take 10 mLs (10 mg total) by mouth at bedtime. Patient taking differently: Place 10 mLs into feeding tube at bedtime. 07/27/23   Sowles, Krichna, MD  glycopyrrolate  (CUVPOSA ) 0.2 mg/ml SOLN Place 5 mLs (1,000 mcg total) into feeding tube 2 (two) times daily. 04/08/24   Adhikari, Amrit, MD  ipratropium-albuterol  (DUONEB) 0.5-2.5 (3) MG/3ML SOLN USE 3 ML VIA NEBULIZER EVERY 4 HOURS AS NEEDED Patient taking differently: Inhale 3 mLs into the lungs every 4 (four) hours as needed (wheezing/sob). 11/04/23   Sowles, Krichna, MD  metoCLOPramide  (REGLAN ) 5 MG/5ML solution Place 10 mLs (10 mg total) into feeding tube every 8 (eight) hours as needed for nausea. Patient not taking: Reported on 05/29/2024 04/08/24   Adhikari, Amrit, MD  polyethylene glycol powder (GLYCOLAX /MIRALAX ) 17 GM/SCOOP powder Place 17 g into feeding tube daily. Titrate as needed for constipation Patient taking differently: Place 17 g into feeding tube daily as needed for mild constipation or moderate constipation.  07/27/23   Sowles, Krichna, MD  Polyvinyl Alcohol -Povidone (CLEAR EYES NATURAL TEARS) 5-6 MG/ML SOLN Apply 1 drop to eye 3 (three) times daily as needed. Patient taking differently: Place 1 drop into both eyes daily as needed (dry eye). 07/27/23   Sowles, Krichna, MD  scopolamine  (TRANSDERM-SCOP) 1 MG/3DAYS PLACE 1 PATCH ONTO THE SKIN EVERY 3 DAYS Patient taking differently: Place 1 patch onto the skin every 3 (three) days. 08/18/23   Sowles, Krichna, MD  Simethicone  40 MG/0.6ML LIQD 1.2 mLs (80 mg total) by Per J Tube route in the morning, at noon, in the evening, and at bedtime. Patient taking differently: Place 80 mg into feeding tube at bedtime. 03/12/24   Gonfa, Taye T, MD  sodium chloride  flush (NS) 0.9 % SOLN 10-40 mLs by Intracatheter route every 12 (twelve) hours. Patient not taking: Reported on 05/29/2024 03/12/24   Gonfa, Taye T, MD  Water  For Irrigation, Sterile (FREE WATER ) SOLN Place 125 mLs into feeding tube every 6 (six) hours. Patient taking differently: Place 125 mLs into feeding tube every 2 (two) hours. 03/12/24   Kathrin Mignon DASEN, MD     Critical care time: NA    RODGER Emilio DEVONNA [facility] 07/02/2024, 11:00 PM  Please see [email or web address] for pager details.  From 7A-7P if no response, please call [phone]. After hours, please call ELink [phone].           [1]  Allergies Allergen Reactions   Crab [  Shellfish Allergy] Rash    Confirmed no allergy to fish with wife 02/28/24   "

## 2024-07-02 NOTE — ED Provider Notes (Signed)
 " [place] EMERGENCY DEPARTMENT AT [facility] Provider Note   CSN: [id number] Arrival date & time: 07/02/24  1730     Patient presents with: Fatigue and Hyperglycemia   Jerry [name] is a 53 y.o. male.   Patient is a 53 year old male who has a history of ALS and is vent dependent.  He presents with increased dry mouth and decreased oral secretions per wife who is at bedside.  He is also been sleeping more than normal.  He has not had any noted fevers.  He denies any vomiting.  He has had some constipation and has not had a bowel movement in the last 6 days.  This is not uncommon for him.  They have been using things at home without improvement.  Although he does not complain of any abdominal pain.  He reports a little bit of increased shortness of breath.  He has a history of prediabetes but is not on medication for diabetes.  He was recently admitted in November for cholecystitis and had a drain placed.  This has since been removed.       Prior to Admission medications  Medication Sig Start Date End Date Taking? Authorizing Provider  cetirizine  HCl (ZYRTEC ) 5 MG/5ML SOLN Take 10 mLs (10 mg total) by mouth at bedtime. Patient taking differently: Place 10 mLs into feeding tube at bedtime. 07/27/23   Sowles, Krichna, MD  glycopyrrolate  (CUVPOSA ) 0.2 mg/ml SOLN Place 5 mLs (1,000 mcg total) into feeding tube 2 (two) times daily. 04/08/24   Adhikari, Amrit, MD  ipratropium-albuterol  (DUONEB) 0.5-2.5 (3) MG/3ML SOLN USE 3 ML VIA NEBULIZER EVERY 4 HOURS AS NEEDED Patient taking differently: Inhale 3 mLs into the lungs every 4 (four) hours as needed (wheezing/sob). 11/04/23   Sowles, Krichna, MD  metoCLOPramide  (REGLAN ) 5 MG/5ML solution Place 10 mLs (10 mg total) into feeding tube every 8 (eight) hours as needed for nausea. Patient not taking: Reported on 05/29/2024 04/08/24   Adhikari, Amrit, MD  polyethylene glycol powder (GLYCOLAX /MIRALAX ) 17 GM/SCOOP powder Place 17 g into feeding  tube daily. Titrate as needed for constipation Patient taking differently: Place 17 g into feeding tube daily as needed for mild constipation or moderate constipation. 07/27/23   Sowles, Krichna, MD  Polyvinyl Alcohol -Povidone (CLEAR EYES NATURAL TEARS) 5-6 MG/ML SOLN Apply 1 drop to eye 3 (three) times daily as needed. Patient taking differently: Place 1 drop into both eyes daily as needed (dry eye). 07/27/23   Sowles, Krichna, MD  scopolamine  (TRANSDERM-SCOP) 1 MG/3DAYS PLACE 1 PATCH ONTO THE SKIN EVERY 3 DAYS Patient taking differently: Place 1 patch onto the skin every 3 (three) days. 08/18/23   Sowles, Krichna, MD  Simethicone  40 MG/0.6ML LIQD 1.2 mLs (80 mg total) by Per J Tube route in the morning, at noon, in the evening, and at bedtime. Patient taking differently: Place 80 mg into feeding tube at bedtime. 03/12/24   Gonfa, Taye T, MD  sodium chloride  flush (NS) 0.9 % SOLN 10-40 mLs by Intracatheter route every 12 (twelve) hours. Patient not taking: Reported on 05/29/2024 03/12/24   Gonfa, Taye T, MD  Water  For Irrigation, Sterile (FREE WATER ) SOLN Place 125 mLs into feeding tube every 6 (six) hours. Patient taking differently: Place 125 mLs into feeding tube every 2 (two) hours. 03/12/24   Gonfa, Taye T, MD    Allergies: Georgianne cerise allergy]    Review of Systems  Unable to perform ROS: Intubated    Updated Vital Signs BP (!) 136/92  Pulse 97   Temp 99.4 F (37.4 C) (Rectal)   Resp 16   Ht 5' 5 (1.651 m)   SpO2 100%   BMI 27.46 kg/m   Physical Exam Constitutional:      Appearance: He is well-developed.  HENT:     Head: Normocephalic and atraumatic.  Eyes:     Pupils: Pupils are equal, round, and reactive to light.  Neck:     Comments: Tracheostomy tube in place Cardiovascular:     Rate and Rhythm: Normal rate and regular rhythm.     Heart sounds: Normal heart sounds.  Pulmonary:     Effort: Pulmonary effort is normal. No respiratory distress.     Breath sounds:  Normal breath sounds. No wheezing or rales.  Chest:     Chest wall: No tenderness.  Abdominal:     General: Bowel sounds are normal. There is distension.     Palpations: Abdomen is soft.     Tenderness: There is no abdominal tenderness. There is no guarding or rebound.     Comments: Tympanic to percussion, there is a healing wound to his right abdomen where he had a prior drain.  No surrounding signs of infection  Musculoskeletal:        General: Normal range of motion.     Cervical back: Normal range of motion and neck supple.  Lymphadenopathy:     Cervical: No cervical adenopathy.  Skin:    General: Skin is warm and dry.     Findings: No rash.  Neurological:     Mental Status: He is alert.     Comments: Patient is awake and will answer some questions with blinking that his wife can understand.  He has a tablet at bedside to communicate with but is not currently working.  That he has generalized weakness and limited movement of his extremities.     (all labs ordered are listed, but only abnormal results are displayed) Labs Reviewed  COMPREHENSIVE METABOLIC PANEL WITH GFR - Abnormal; Notable for the following components:      Result Value   Sodium 155 (*)    Chloride 116 (*)    CO2 13 (*)    Glucose, Bld 400 (*)    Calcium  10.8 (*)    Total Protein 9.8 (*)    AST 60 (*)    ALT 177 (*)    Alkaline Phosphatase 497 (*)    Total Bilirubin 1.3 (*)    Anion gap 26 (*)    All other components within normal limits  CBC WITH DIFFERENTIAL/PLATELET - Abnormal; Notable for the following components:   WBC 10.7 (*)    MCH 25.1 (*)    MCHC 29.9 (*)    RDW 20.7 (*)    All other components within normal limits  URINALYSIS, W/ REFLEX TO CULTURE (INFECTION SUSPECTED) - Abnormal; Notable for the following components:   Specific Gravity, Urine 1.033 (*)    Glucose, UA >=500 (*)    Ketones, ur 80 (*)    Protein, ur >=300 (*)    All other components within normal limits  OSMOLALITY -  Abnormal; Notable for the following components:   Osmolality 368 (*)    All other components within normal limits  I-STAT CHEM 8, ED - Abnormal; Notable for the following components:   Sodium 159 (*)    Chloride 123 (*)    BUN 21 (*)    Creatinine, Ser <0.20 (*)    Glucose, Bld 414 (*)  TCO2 16 (*)    All other components within normal limits  I-STAT VENOUS BLOOD GAS, ED - Abnormal; Notable for the following components:   pCO2, Ven 22.5 (*)    pO2, Ven 143 (*)    Bicarbonate 14.3 (*)    TCO2 15 (*)    Acid-base deficit 8.0 (*)    Sodium 159 (*)    All other components within normal limits  CBG MONITORING, ED - Abnormal; Notable for the following components:   Glucose-Capillary 281 (*)    All other components within normal limits  RESP PANEL BY RT-PCR (RSV, FLU A&B, COVID)  RVPGX2  CULTURE, BLOOD (ROUTINE X 2)  CULTURE, BLOOD (ROUTINE X 2)  PROTIME-INR  BETA-HYDROXYBUTYRIC ACID  I-STAT CG4 LACTIC ACID, ED  I-STAT CG4 LACTIC ACID, ED  CBG MONITORING, ED  TROPONIN T, HIGH SENSITIVITY  TROPONIN T, HIGH SENSITIVITY    EKG: EKG Interpretation Date/Time:  Monday July 02 2024 18:34:49 EST Ventricular Rate:  107 PR Interval:  116 QRS Duration:  90 QT Interval:  350 QTC Calculation: 467 R Axis:   -34  Text Interpretation: Sinus tachycardia Probable left atrial enlargement Left axis deviation Borderline low voltage, extremity leads Abnormal T, consider ischemia, diffuse leads Confirmed by Lenor Hollering [id number]) on 07/02/2024 7:29:58 PM  Radiology: CT ABDOMEN PELVIS WO CONTRAST Result Date: 07/02/2024 EXAM: CT ABDOMEN AND PELVIS WITHOUT CONTRAST 07/02/2024 07:26:20 PM TECHNIQUE: CT of the abdomen and pelvis was performed without the administration of intravenous contrast. Multiplanar reformatted images are provided for review. Automated exposure control, iterative reconstruction, and/or weight-based adjustment of the mA/kV was utilized to reduce the radiation dose to as low  as reasonably achievable. COMPARISON: Prior examination of 02/26/2024. CLINICAL HISTORY: Bowel obstruction suspected. FINDINGS: LOWER CHEST: Peribronchovascular nodule or infiltrate within the visualized right lung base is new from prior examination and likely relates to acute infection. Trace left pleural effusion with associated left basilar atelectasis. LIVER: The liver is unremarkable. GALLBLADDER AND BILE DUCTS: A small amount of layering calcific debris is noted within the gallbladder fundus. The gallbladder now demonstrates marked diffuse wall thickening and extensive pericholecystic inflammatory stranding in keeping with changes of acute calculus cholecystitis. No biliary ductal dilatation. SPLEEN: No acute abnormality. PANCREAS: No acute abnormality. ADRENAL GLANDS: No acute abnormality. KIDNEYS, URETERS AND BLADDER: Numerous nonobstructing calculi are seen within the kidneys bilaterally measuring up to 8 mm. No ureteral calculi. No hydronephrosis. No perinephric inflammatory stranding or fluid collections were identified. The bladder is unremarkable. GI AND BOWEL: Retention gastrojejunostomies are in place with a catheter coiled within the gastric lumen. Appendix is normal. The stomach, small bowel, and large bowel are otherwise unremarkable. There is no bowel obstruction. PERITONEUM AND RETROPERITONEUM: No ascites. No free air. VASCULATURE: Aorta is normal in caliber. LYMPH NODES: No lymphadenopathy. REPRODUCTIVE ORGANS: No acute abnormality. BONES AND SOFT TISSUES: Osseous structures are age appropriate. No acute bone abnormality. No lytic or blastic bone lesion. Marked atrophy of the skeletal musculature of the retroperitoneum, paraspinal region, and lower extremities bilaterally. IMPRESSION: 1. No evidence of bowel obstruction. 2. Acute calculus cholecystitis. 3. New peribronchovascular nodule or infiltrate within the visualized right lung base, likely related to acute infection. 4. Numerous  nonobstructing renal calculi bilaterally measuring up to 8 mm, without hydronephrosis. 5. Marked atrophy of the skeletal musculature of the retroperitoneum, paraspinal region, and lower extremities bilaterally in keeping with the patient's known als . Electronically signed by: Dorethia Molt MD 07/02/2024 09:17 PM EST RP Workstation: [id number]   DG  Chest Port 1 View Result Date: 07/02/2024 EXAM: 1 VIEW(S) XRAY OF THE CHEST 07/02/2024 06:35:00 PM COMPARISON: 05/28/2024 CLINICAL HISTORY: Questionable sepsis - evaluate for abnormality FINDINGS: LINES, TUBES AND DEVICES: Tracheostomy tube in place over tracheal air column. Right IJ dual lumen central venous catheter with tip at right atrium. Percutaneous gastrojejunostomy tube in upper abdomen, likely coiled within the gastric lumen. LUNGS AND PLEURA: Low lung volumes. Mildly elevated left hemidiaphragm. No focal pulmonary opacity. No pleural effusion. No pneumothorax. HEART AND MEDIASTINUM: No acute abnormality of the cardiac and mediastinal silhouettes. BONES AND SOFT TISSUES: No acute osseous abnormality. IMPRESSION: 1. No acute cardiopulmonary abnormality. 2. Tracheostomy tube and right IJ dual lumen central venous catheter in place. 3. Percutaneous gastrojejunostomy tube likely coiled within the gastric lumen. Electronically signed by: Dorethia Molt MD 07/02/2024 08:47 PM EST RP Workstation: [id number]     Procedures   Medications Ordered in the ED  piperacillin -tazobactam (ZOSYN ) IVPB 3.375 g (0 g Intravenous Stopped 07/02/24 2243)    Followed by  piperacillin -tazobactam (ZOSYN ) IVPB 3.375 g (has no administration in time range)  insulin  regular, human (MYXREDLIN ) 100 units/ 100 mL infusion (has no administration in time range)  lactated ringers  infusion (has no administration in time range)  dextrose  5 % in lactated ringers  infusion (has no administration in time range)  dextrose  50 % solution 0-50 mL (has no administration in time range)   potassium chloride  10 mEq in 100 mL IVPB (has no administration in time range)  sodium chloride  0.9 % bolus 1,000 mL (has no administration in time range)  sodium chloride  0.9 % bolus 1,000 mL (0 mLs Intravenous Stopped 07/02/24 2125)                                    Medical Decision Making Amount and/or Complexity of Data Reviewed Labs: ordered. Radiology: ordered.  Risk Prescription drug management. Decision regarding hospitalization.   This patient presents to the ED for concern of fatigue, dry mouth, this involves an extensive number of treatment options, and is a complaint that carries with it a high risk of complications and morbidity.  I considered the following differential and admission for this acute, potentially life threatening condition.  The differential diagnosis includes hyperglycemia, DKA, dehydration, abdominal infection, sepsis, UTI, pneumonia  MDM:    Patient is a 53 year old male with a history of ALS who is vent dependent who presents with dry mouth and fatigue.  He has a history of prediabetes but is not on medication for this.  He has not had any fevers.  He is afebrile here.  His glucose is elevated.  Labs also indicate acidosis with an increased anion gap and low bicarb.  Was started on IV fluids and Endo tool for probable DKA.  His osmolality is elevated.  His urine does not look consistent with infection.  Blood cultures were obtained.  CBC shows a mildly elevated WBC count but normal lactate.  His LFTs are elevated and bilirubin is slightly elevated.  CT scan shows concerns for acute cholecystitis.  Discussed with Dr. Lyndel who will consult on the patient.  Patient was started on IV Zosyn .  Discussed with Dr. Layman with the ICU team who will consult on the bed but recommends hospitalist admission.  Discussed with Dr. Marcene who will admit the pt.  CRITICAL CARE Performed by: Andrea Ness Total critical care time: 80 minutes Critical care time  was exclusive  of separately billable procedures and treating other patients. Critical care was necessary to treat or prevent imminent or life-threatening deterioration. Critical care was time spent personally by me on the following activities: development of treatment plan with patient and/or surrogate as well as nursing, discussions with consultants, evaluation of patient's response to treatment, examination of patient, obtaining history from patient or surrogate, ordering and performing treatments and interventions, ordering and review of laboratory studies, ordering and review of radiographic studies, pulse oximetry and re-evaluation of patient's condition.   (Labs, imaging, consults)  Labs: I Ordered, and personally interpreted labs.  The pertinent results include: Mildly elevated WBC count, normal lactate hyponatremia concerning for dehydration, elevated glucose, positive anion gap, low bicarb, increased osmolality, respiratory viral panel negative, normal pH on VBG.  Imaging Studies ordered: I ordered imaging studies including chest x-ray, CT abdomen pelvis I independently visualized and interpreted imaging. I agree with the radiologist interpretation  Additional history obtained from wife at bedside.  External records from outside source obtained and reviewed including history  Cardiac Monitoring: The patient was maintained on a cardiac monitor.  If on the cardiac monitor, I personally viewed and interpreted the cardiac monitored which showed an underlying rhythm of: Sinus tachycardia  Reevaluation: After the interventions noted above, I reevaluated the patient and found that they have :improved  Social Determinants of Health:    Disposition: Admit to hospital  Co morbidities that complicate the patient evaluation  Past Medical History:  Diagnosis Date   ALS (amyotrophic lateral sclerosis) (HCC)    COVID-19 virus infection 06/2020   Meningitis 2003   spinal   Morbid obesity  (HCC)    Neuromuscular disorder (HCC)    Prediabetes 11/05/2016   A1C 5.7 on 11/05/16   Type 2 diabetes mellitus (HCC) 08/31/2023     Medicines Meds ordered this encounter  Medications   sodium chloride  0.9 % bolus 1,000 mL   FOLLOWED BY Linked Order Group    piperacillin -tazobactam (ZOSYN ) IVPB 3.375 g     Antibiotic Indication::   Intra-abdominal Infection    piperacillin -tazobactam (ZOSYN ) IVPB 3.375 g     Antibiotic Indication::   Intra-abdominal Infection   insulin  regular, human (MYXREDLIN ) 100 units/ 100 mL infusion    EndoTool Goal Range::   140-180    Type of Diabetes:   Type 2    Mode of Therapy:   ENDOX1 for DKA    Start Method:   EndoTool to calculate   lactated ringers  infusion   dextrose  5 % in lactated ringers  infusion   dextrose  50 % solution 0-50 mL   DISCONTD: lactated ringers  bolus 20 mL/kg   potassium chloride  10 mEq in 100 mL IVPB   sodium chloride  0.9 % bolus 1,000 mL    I have reviewed the patients home medicines and have made adjustments as needed  Problem List / ED Course: Problem List Items Addressed This Visit   None Visit Diagnoses       Cholecystitis    -  Primary     Diabetic ketoacidosis without coma associated with type 2 diabetes mellitus (HCC)       Relevant Medications   insulin  regular, human (MYXREDLIN ) 100 units/ 100 mL infusion                Final diagnoses:  Cholecystitis  Diabetic ketoacidosis without coma associated with type 2 diabetes mellitus [facility])    ED Discharge Orders     None  Lenor Hollering, MD 07/02/24 [phone]  "

## 2024-07-02 NOTE — H&P (Signed)
 " History and Physical      Jerry [name]:[id number] DOB: 30-Jan-1971 DOA: 07/02/2024; DOS: 07/02/2024  [facility]P: [facility] *** Patient coming from: home ***  I have personally briefly reviewed patient's old medical records in [name]  Chief Complaint: ***  HPI: Jerry [name] is a 53 y.o. male with medical history significant for *** who is admitted to [name] on 07/02/2024 with *** after presenting from home*** to [name] complaining of ***.    ***       ***   ED Course:  Vital signs in the ED were notable for the following: ***  Labs were notable for the following: ***  Per my interpretation, EKG in ED demonstrated the following:  ***  Imaging in the ED, per corresponding formal radiology read, was notable for the following:  ***  While in the ED, the following were administered: ***  Subsequently, the patient was admitted  ***  ***red    Review of Systems: As per HPI otherwise 10 point review of systems negative.   Past Medical History:  Diagnosis Date   ALS (amyotrophic lateral sclerosis) (HCC)    COVID-19 virus infection 06/2020   Meningitis 2003   spinal   Morbid obesity (HCC)    Neuromuscular disorder (HCC)    Prediabetes 11/05/2016   A1C 5.7 on 11/05/16   Type 2 diabetes mellitus (HCC) 08/31/2023    Past Surgical History:  Procedure Laterality Date   IR CHOLANGIOGRAM EXISTING TUBE  02/29/2024   IR CHOLANGIOGRAM EXISTING TUBE  05/29/2024   IR GJ TUBE CHANGE  03/01/2024   IR GJ TUBE CHANGE  04/06/2024   IR GJ TUBE CHANGE  05/29/2024   IR PERC CHOLECYSTOSTOMY  02/26/2024   IR REPLACE G-TUBE SIMPLE WO FLUORO  06/22/2022   MASS EXCISION Right 11/26/2014   Procedure: EXCISION MASS/GROIN EXCISION MASS;  Surgeon: Reyes LELON Cota, MD;  Location: [facility] ORS;  Service: General;  Laterality: Right;   MASS EXCISION Right 11/26/2014   Procedure: MINOR EXCISION OF MASS/POST THIGH MASS;  Surgeon: Reyes LELON Cota, MD;   Location: [facility] ORS;  Service: General;  Laterality: Right;   PEG PLACEMENT  10/27/2021   PORTACATH PLACEMENT Right 06/19/2019    Social History:  reports that he has never smoked. He has never used smokeless tobacco. He reports that he does not drink alcohol  and does not use drugs.   Allergies[1]  Family History  Problem Relation Age of Onset   Diabetes Father    Diabetes Mother    Diabetes Sister    Hypertension Sister    Diabetes Brother    Diabetes Sister    Fibromyalgia Sister    Diabetes Sister     Family history reviewed and not pertinent ***   Prior to Admission medications  Medication Sig Start Date End Date Taking? Authorizing Provider  cetirizine  HCl (ZYRTEC ) 5 MG/5ML SOLN Take 10 mLs (10 mg total) by mouth at bedtime. Patient taking differently: Place 10 mLs into feeding tube at bedtime. 07/27/23   Sowles, Krichna, MD  glycopyrrolate  (CUVPOSA ) 0.2 mg/ml SOLN Place 5 mLs (1,000 mcg total) into feeding tube 2 (two) times daily. 04/08/24   Adhikari, Amrit, MD  ipratropium-albuterol  (DUONEB) 0.5-2.5 (3) MG/3ML SOLN USE 3 ML VIA NEBULIZER EVERY 4 HOURS AS NEEDED Patient taking differently: Inhale 3 mLs into the lungs every 4 (four) hours as needed (wheezing/sob). 11/04/23   Sowles, Krichna, MD  metoCLOPramide  (REGLAN ) 5 MG/5ML solution Place 10 mLs (10  mg total) into feeding tube every 8 (eight) hours as needed for nausea. Patient not taking: Reported on 05/29/2024 04/08/24   Jerry Buttery, MD  polyethylene glycol powder (GLYCOLAX /MIRALAX ) 17 GM/SCOOP powder Place 17 g into feeding tube daily. Titrate as needed for constipation Patient taking differently: Place 17 g into feeding tube daily as needed for mild constipation or moderate constipation. 07/27/23   Sowles, Krichna, MD  Polyvinyl Alcohol -Povidone (CLEAR EYES NATURAL TEARS) 5-6 MG/ML SOLN Apply 1 drop to eye 3 (three) times daily as needed. Patient taking differently: Place 1 drop into both eyes daily as needed (dry  eye). 07/27/23   Sowles, Krichna, MD  scopolamine  (TRANSDERM-SCOP) 1 MG/3DAYS PLACE 1 PATCH ONTO THE SKIN EVERY 3 DAYS Patient taking differently: Place 1 patch onto the skin every 3 (three) days. 08/18/23   Sowles, Krichna, MD  Simethicone  40 MG/0.6ML LIQD 1.2 mLs (80 mg total) by Per J Tube route in the morning, at noon, in the evening, and at bedtime. Patient taking differently: Place 80 mg into feeding tube at bedtime. 03/12/24   Gonfa, Taye T, MD  sodium chloride  flush (NS) 0.9 % SOLN 10-40 mLs by Intracatheter route every 12 (twelve) hours. Patient not taking: Reported on 05/29/2024 03/12/24   Gonfa, Taye T, MD  Water  For Irrigation, Sterile (FREE WATER ) SOLN Place 125 mLs into feeding tube every 6 (six) hours. Patient taking differently: Place 125 mLs into feeding tube every 2 (two) hours. 03/12/24   Jerry Mignon DASEN, MD     Objective    Physical Exam: Vitals:   07/02/24 1926 07/02/24 2000 07/02/24 2100 07/02/24 2324  BP:  (!) 115/90 (!) 136/92   Pulse: (!) 110 99 97 (!) 105  Resp: 16 16 16 16   Temp:   99.4 F (37.4 C)   TempSrc:   Rectal   SpO2: 100% 100% 100% 100%  Height:        General: appears to be stated age; alert, oriented Skin: warm, dry, no rash Head:  AT/Funk Mouth:  Oral mucosa membranes appear moist, normal dentition Neck: supple; trachea midline Heart:  RRR; did not appreciate any M/R/G Lungs: CTAB, did not appreciate any wheezes, rales, or rhonchi Abdomen: + BS; soft, ND, NT Vascular: 2+ pedal pulses b/l; 2+ radial pulses b/l Extremities: no peripheral edema, no muscle wasting   ***   *** Neuro: strength and sensation intact in upper and lower extremities b/l  *** Neuro: 5/5 strength of the proximal and distal flexors and extensors of the upper and lower extremities bilaterally; sensation intact in upper and lower extremities b/l; cranial nerves II through XII grossly intact; no pronator drift; no evidence suggestive of slurred speech, dysarthria, or facial  droop; Normal muscle tone. No tremors. *** Neuro: In the setting of the patient's current mental status and associated inability to follow instructions, unable to perform full neurologic exam at this time.  As such, assessment of strength, sensation, and cranial nerves is limited at this time. Patient noted to spontaneously move all 4 extremities. No tremors.  ***        Labs on Admission: I have personally reviewed following labs and imaging studies  CBC: Recent Labs  Lab 07/02/24 1804 07/02/24 1822 07/02/24 2026  WBC 10.7*  --   --   NEUTROABS 7.6  --   --   HGB 14.1 15.3 14.6  HCT 47.1 45.0 43.0  MCV 84.0  --   --   PLT 163  --   --  Basic Metabolic Panel: Recent Labs  Lab 07/02/24 1804 07/02/24 1822 07/02/24 2026  NA 155* 159* 159*  K 4.3 4.1 3.7  CL 116* 123*  --   CO2 13*  --   --   GLUCOSE 400* 414*  --   BUN 19 21*  --   CREATININE 0.65 <0.20*  --   CALCIUM  10.8*  --   --    GFR: CrCl cannot be calculated (This lab value cannot be used to calculate CrCl because it is not a number: <0.20). Liver Function Tests: Recent Labs  Lab 07/02/24 1804  AST 60*  ALT 177*  ALKPHOS 497*  BILITOT 1.3*  PROT 9.8*  ALBUMIN 4.5   No results for input(s): LIPASE, AMYLASE in the last 168 hours. No results for input(s): AMMONIA in the last 168 hours. Coagulation Profile: Recent Labs  Lab 07/02/24 1804  INR 1.0   Cardiac Enzymes: No results for input(s): CKTOTAL, CKMB, CKMBINDEX, TROPONINI in the last 168 hours. BNP (last 3 results) No results for input(s): PROBNP in the last 8760 hours. HbA1C: No results for input(s): HGBA1C in the last 72 hours. CBG: Recent Labs  Lab 07/02/24 2209 07/02/24 2258  GLUCAP 281* 296*   Lipid Profile: No results for input(s): CHOL, HDL, LDLCALC, TRIG, CHOLHDL, LDLDIRECT in the last 72 hours. Thyroid Function Tests: No results for input(s): TSH, T4TOTAL, FREET4, T3FREE, THYROIDAB in  the last 72 hours. Anemia Panel: No results for input(s): VITAMINB12, FOLATE, FERRITIN, TIBC, IRON, RETICCTPCT in the last 72 hours. Urine analysis:    Component Value Date/Time   COLORURINE YELLOW 07/02/2024 1816   APPEARANCEUR CLEAR 07/02/2024 1816   LABSPEC 1.033 (H) 07/02/2024 1816   PHURINE 5.0 07/02/2024 1816   GLUCOSEU >=500 (A) 07/02/2024 1816   HGBUR NEGATIVE 07/02/2024 1816   BILIRUBINUR NEGATIVE 07/02/2024 1816   KETONESUR 80 (A) 07/02/2024 1816   PROTEINUR >=300 (A) 07/02/2024 1816   NITRITE NEGATIVE 07/02/2024 1816   LEUKOCYTESUR NEGATIVE 07/02/2024 1816    Radiological Exams on Admission: CT ABDOMEN PELVIS WO CONTRAST Result Date: 07/02/2024 EXAM: CT ABDOMEN AND PELVIS WITHOUT CONTRAST 07/02/2024 07:26:20 PM TECHNIQUE: CT of the abdomen and pelvis was performed without the administration of intravenous contrast. Multiplanar reformatted images are provided for review. Automated exposure control, iterative reconstruction, and/or weight-based adjustment of the mA/kV was utilized to reduce the radiation dose to as low as reasonably achievable. COMPARISON: Prior examination of 02/26/2024. CLINICAL HISTORY: Bowel obstruction suspected. FINDINGS: LOWER CHEST: Peribronchovascular nodule or infiltrate within the visualized right lung base is new from prior examination and likely relates to acute infection. Trace left pleural effusion with associated left basilar atelectasis. LIVER: The liver is unremarkable. GALLBLADDER AND BILE DUCTS: A small amount of layering calcific debris is noted within the gallbladder fundus. The gallbladder now demonstrates marked diffuse wall thickening and extensive pericholecystic inflammatory stranding in keeping with changes of acute calculus cholecystitis. No biliary ductal dilatation. SPLEEN: No acute abnormality. PANCREAS: No acute abnormality. ADRENAL GLANDS: No acute abnormality. KIDNEYS, URETERS AND BLADDER: Numerous nonobstructing calculi  are seen within the kidneys bilaterally measuring up to 8 mm. No ureteral calculi. No hydronephrosis. No perinephric inflammatory stranding or fluid collections were identified. The bladder is unremarkable. GI AND BOWEL: Retention gastrojejunostomies are in place with a catheter coiled within the gastric lumen. Appendix is normal. The stomach, small bowel, and large bowel are otherwise unremarkable. There is no bowel obstruction. PERITONEUM AND RETROPERITONEUM: No ascites. No free air. VASCULATURE: Aorta is normal in caliber. LYMPH  NODES: No lymphadenopathy. REPRODUCTIVE ORGANS: No acute abnormality. BONES AND SOFT TISSUES: Osseous structures are age appropriate. No acute bone abnormality. No lytic or blastic bone lesion. Marked atrophy of the skeletal musculature of the retroperitoneum, paraspinal region, and lower extremities bilaterally. IMPRESSION: 1. No evidence of bowel obstruction. 2. Acute calculus cholecystitis. 3. New peribronchovascular nodule or infiltrate within the visualized right lung base, likely related to acute infection. 4. Numerous nonobstructing renal calculi bilaterally measuring up to 8 mm, without hydronephrosis. 5. Marked atrophy of the skeletal musculature of the retroperitoneum, paraspinal region, and lower extremities bilaterally in keeping with the patient's known als . Electronically signed by: Dorethia Molt MD 07/02/2024 09:17 PM EST RP Workstation: [id number]   DG Chest Port 1 View Result Date: 07/02/2024 EXAM: 1 VIEW(S) XRAY OF THE CHEST 07/02/2024 06:35:00 PM COMPARISON: 05/28/2024 CLINICAL HISTORY: Questionable sepsis - evaluate for abnormality FINDINGS: LINES, TUBES AND DEVICES: Tracheostomy tube in place over tracheal air column. Right IJ dual lumen central venous catheter with tip at right atrium. Percutaneous gastrojejunostomy tube in upper abdomen, likely coiled within the gastric lumen. LUNGS AND PLEURA: Low lung volumes. Mildly elevated left hemidiaphragm. No focal  pulmonary opacity. No pleural effusion. No pneumothorax. HEART AND MEDIASTINUM: No acute abnormality of the cardiac and mediastinal silhouettes. BONES AND SOFT TISSUES: No acute osseous abnormality. IMPRESSION: 1. No acute cardiopulmonary abnormality. 2. Tracheostomy tube and right IJ dual lumen central venous catheter in place. 3. Percutaneous gastrojejunostomy tube likely coiled within the gastric lumen. Electronically signed by: Dorethia Molt MD 07/02/2024 08:47 PM EST RP Workstation: [id number]      Assessment/Plan   Principal Problem:   DKA (diabetic ketoacidosis) (HCC)   ***            ***                     ***                      ***                     ***                     ***                     ***                      ***                     ***                     ***                     ***                     ***                    ***                   ***  DVT prophylaxis: SCD's ***  Code Status: Full code*** Family Communication: none*** Disposition Plan: Per Rounding Team Consults called: none***;  Admission status: ***     I SPENT GREATER THAN 75 *** MINUTES IN CLINICAL CARE TIME/MEDICAL DECISION-MAKING IN COMPLETING THIS ADMISSION.  Eva NOVAK Oluwatoni Rotunno DO [facility]  From 7PM - 7AM   07/02/2024, 11:32 PM   ***     [1]  Allergies Allergen Reactions   Crab [Shellfish Allergy] Rash    Confirmed no allergy to fish with wife 02/28/24   "

## 2024-07-02 NOTE — Consult Note (Signed)
 "  Consulting Physician: Deward PARAS Pearlean Sabina  Referring Provider: Dr. Lenor  Chief Complaint: Fatigue, Dehydration  Reason for Consult: Recurrent acute cholecystitis after percutaneous cholecystostomy tube   Subjective   HPI: Jerry [name] is an 53 y.o. male who is here for dehydration and fatigue.  He has ALS.  His primary caretakers his wife.  She has noticed dehydration with very thick white saliva.  He is also been tired sleeping all through the day.  He had a percutaneous cholecystostomy tube placed in August and removed in November.  He has developed purulent drainage from the site.  He has not been complaining of abdominal pain like he did previously.  He has been admitted to the medical service.  Past Medical History:  Diagnosis Date   ALS (amyotrophic lateral sclerosis) (HCC)    COVID-19 virus infection 06/2020   Meningitis 2003   spinal   Morbid obesity (HCC)    Neuromuscular disorder (HCC)    Prediabetes 11/05/2016   A1C 5.7 on 11/05/16   Type 2 diabetes mellitus (HCC) 08/31/2023    Past Surgical History:  Procedure Laterality Date   IR CHOLANGIOGRAM EXISTING TUBE  02/29/2024   IR CHOLANGIOGRAM EXISTING TUBE  05/29/2024   IR GJ TUBE CHANGE  03/01/2024   IR GJ TUBE CHANGE  04/06/2024   IR GJ TUBE CHANGE  05/29/2024   IR PERC CHOLECYSTOSTOMY  02/26/2024   IR REPLACE G-TUBE SIMPLE WO FLUORO  06/22/2022   MASS EXCISION Right 11/26/2014   Procedure: EXCISION MASS/GROIN EXCISION MASS;  Surgeon: Reyes LELON Cota, MD;  Location: [facility] ORS;  Service: General;  Laterality: Right;   MASS EXCISION Right 11/26/2014   Procedure: MINOR EXCISION OF MASS/POST THIGH MASS;  Surgeon: Reyes LELON Cota, MD;  Location: [facility] ORS;  Service: General;  Laterality: Right;   PEG PLACEMENT  10/27/2021   PORTACATH PLACEMENT Right 06/19/2019    Family History  Problem Relation Age of Onset   Diabetes Father    Diabetes Mother    Diabetes Sister    Hypertension Sister    Diabetes Brother     Diabetes Sister    Fibromyalgia Sister    Diabetes Sister     Social:  reports that he has never smoked. He has never used smokeless tobacco. He reports that he does not drink alcohol  and does not use drugs.  Allergies: Allergies[1]  Medications: Current Outpatient Medications  Medication Instructions   cetirizine  HCl (ZYRTEC ) 10 mg, Oral, Nightly   glycopyrrolate  (CUVPOSA ) 1,000 mcg, Per Tube, 2 times daily   ipratropium-albuterol  (DUONEB) 0.5-2.5 (3) MG/3ML SOLN USE 3 ML VIA NEBULIZER EVERY 4 HOURS AS NEEDED   metoCLOPramide  (REGLAN ) 10 mg, Per Tube, Every 8 hours PRN   polyethylene glycol powder (GLYCOLAX /MIRALAX ) 17 g, Per Tube, Daily, Titrate as needed for constipation   Polyvinyl Alcohol -Povidone (CLEAR EYES NATURAL TEARS) 5-6 MG/ML SOLN 1 drop, Ophthalmic, 3 times daily PRN   scopolamine  (TRANSDERM-SCOP) 1 MG/3DAYS PLACE 1 PATCH ONTO THE SKIN EVERY 3 DAYS   Simethicone  80 mg, Per J Tube, 4 times daily   sodium chloride  flush (NS) 0.9 % SOLN 10-40 mLs, Intracatheter, Every 12 hours   Water  For Irrigation, Sterile (FREE WATER ) SOLN 125 mLs, Per Tube, Every 6 hours    ROS - all of the below systems have been reviewed with the patient and positives are indicated with bold text General: chills, fever or night sweats Eyes: blurry vision or double vision ENT: epistaxis or sore throat Allergy/Immunology: itchy/watery eyes or  nasal congestion Hematologic/Lymphatic: bleeding problems, blood clots or swollen lymph nodes Endocrine: temperature intolerance or unexpected weight changes Breast: new or changing breast lumps or nipple discharge Resp: cough, shortness of breath, or wheezing CV: chest pain or dyspnea on exertion GI: as per HPI GU: dysuria, trouble voiding, or hematuria MSK: joint pain or joint stiffness Neuro: TIA or stroke symptoms Derm: pruritus and skin lesion changes Psych: anxiety and depression  Objective   PE Blood pressure (!) 136/92, pulse 97, temperature  99.4 F (37.4 C), temperature source Rectal, resp. rate 16, height 5' 5 (1.651 m), SpO2 100%. Constitutional: NAD; no deformities Eyes: no lid lag; anicteric; PERRL Neck: Tracheostomy Lungs: Portable ventilator CV: RRR; no palpable thrills; no pitting edema GI: Abd distended, mild epigastric tenderness; IR drain site in the right upper quadrant with purulent drainage on dressing  MSK: no clubbing/cyanosis  Results for orders placed or performed during the hospital encounter of 07/02/24 (from the past 24 hours)  Comprehensive metabolic panel     Status: Abnormal   Collection Time: 07/02/24  6:04 PM  Result Value Ref Range   Sodium 155 (H) 135 - 145 mmol/L   Potassium 4.3 3.5 - 5.1 mmol/L   Chloride 116 (H) 98 - 111 mmol/L   CO2 13 (L) 22 - 32 mmol/L   Glucose, Bld 400 (H) 70 - 99 mg/dL   BUN 19 6 - 20 mg/dL   Creatinine, Ser 9.34 0.61 - 1.24 mg/dL   Calcium  10.8 (H) 8.9 - 10.3 mg/dL   Total Protein 9.8 (H) 6.5 - 8.1 g/dL   Albumin 4.5 3.5 - 5.0 g/dL   AST 60 (H) 15 - 41 U/L   ALT 177 (H) 0 - 44 U/L   Alkaline Phosphatase 497 (H) 38 - 126 U/L   Total Bilirubin 1.3 (H) 0.0 - 1.2 mg/dL   GFR, Estimated >39 >39 mL/min   Anion gap 26 (H) 5 - 15  CBC with Differential     Status: Abnormal   Collection Time: 07/02/24  6:04 PM  Result Value Ref Range   WBC 10.7 (H) 4.0 - 10.5 K/uL   RBC 5.61 4.22 - 5.81 MIL/uL   Hemoglobin 14.1 13.0 - 17.0 g/dL   HCT 52.8 60.9 - 47.9 %   MCV 84.0 80.0 - 100.0 fL   MCH 25.1 (L) 26.0 - 34.0 pg   MCHC 29.9 (L) 30.0 - 36.0 g/dL   RDW 79.2 (H) 88.4 - 84.4 %   Platelets 163 150 - 400 K/uL   nRBC 0.0 0.0 - 0.2 %   Neutrophils Relative % 70 %   Neutro Abs 7.6 1.7 - 7.7 K/uL   Lymphocytes Relative 23 %   Lymphs Abs 2.4 0.7 - 4.0 K/uL   Monocytes Relative 5 %   Monocytes Absolute 0.5 0.1 - 1.0 K/uL   Eosinophils Relative 1 %   Eosinophils Absolute 0.1 0.0 - 0.5 K/uL   Basophils Relative 1 %   Basophils Absolute 0.1 0.0 - 0.1 K/uL   Immature  Granulocytes 0 %   Abs Immature Granulocytes 0.04 0.00 - 0.07 K/uL  Protime-INR     Status: None   Collection Time: 07/02/24  6:04 PM  Result Value Ref Range   Prothrombin Time 13.6 11.4 - 15.2 seconds   INR 1.0 0.8 - 1.2  Urinalysis, w/ Reflex to Culture (Infection Suspected) -Urine, Catheterized     Status: Abnormal   Collection Time: 07/02/24  6:16 PM  Result Value Ref Range  Specimen Source URINE, CATHETERIZED    Color, Urine YELLOW YELLOW   APPearance CLEAR CLEAR   Specific Gravity, Urine 1.033 (H) 1.005 - 1.030   pH 5.0 5.0 - 8.0   Glucose, UA >=500 (A) NEGATIVE mg/dL   Hgb urine dipstick NEGATIVE NEGATIVE   Bilirubin Urine NEGATIVE NEGATIVE   Ketones, ur 80 (A) NEGATIVE mg/dL   Protein, ur >=699 (A) NEGATIVE mg/dL   Nitrite NEGATIVE NEGATIVE   Leukocytes,Ua NEGATIVE NEGATIVE   RBC / HPF 0-5 0 - 5 RBC/hpf   WBC, UA 0-5 0 - 5 WBC/hpf   Bacteria, UA NONE SEEN NONE SEEN   Squamous Epithelial / HPF 0-5 0 - 5 /HPF   Mucus PRESENT   Resp panel by RT-PCR (RSV, Flu A&B, Covid) Anterior Nasal Swab     Status: None   Collection Time: 07/02/24  6:19 PM   Specimen: Anterior Nasal Swab  Result Value Ref Range   SARS Coronavirus 2 by RT PCR NEGATIVE NEGATIVE   Influenza A by PCR NEGATIVE NEGATIVE   Influenza B by PCR NEGATIVE NEGATIVE   Resp Syncytial Virus by PCR NEGATIVE NEGATIVE  I-Stat Lactic Acid, ED     Status: None   Collection Time: 07/02/24  6:21 PM  Result Value Ref Range   Lactic Acid, Venous 1.3 0.5 - 1.9 mmol/L  I-stat chem 8, ED     Status: Abnormal   Collection Time: 07/02/24  6:22 PM  Result Value Ref Range   Sodium 159 (H) 135 - 145 mmol/L   Potassium 4.1 3.5 - 5.1 mmol/L   Chloride 123 (H) 98 - 111 mmol/L   BUN 21 (H) 6 - 20 mg/dL   Creatinine, Ser <9.79 (L) 0.61 - 1.24 mg/dL   Glucose, Bld 585 (H) 70 - 99 mg/dL   Calcium , Ion 1.29 1.15 - 1.40 mmol/L   TCO2 16 (L) 22 - 32 mmol/L   Hemoglobin 15.3 13.0 - 17.0 g/dL   HCT 54.9 60.9 - 47.9 %  I-Stat venous  blood gas, ([facility], [facility], [facility])     Status: Abnormal   Collection Time: 07/02/24  8:26 PM  Result Value Ref Range   pH, Ven 7.411 7.25 - 7.43   pCO2, Ven 22.5 (L) 44 - 60 mmHg   pO2, Ven 143 (H) 32 - 45 mmHg   Bicarbonate 14.3 (L) 20.0 - 28.0 mmol/L   TCO2 15 (L) 22 - 32 mmol/L   O2 Saturation 99 %   Acid-base deficit 8.0 (H) 0.0 - 2.0 mmol/L   Sodium 159 (H) 135 - 145 mmol/L   Potassium 3.7 3.5 - 5.1 mmol/L   Calcium , Ion 1.24 1.15 - 1.40 mmol/L   HCT 43.0 39.0 - 52.0 %   Hemoglobin 14.6 13.0 - 17.0 g/dL   Sample type VENOUS   I-Stat Lactic Acid, ED     Status: None   Collection Time: 07/02/24  8:27 PM  Result Value Ref Range   Lactic Acid, Venous 1.1 0.5 - 1.9 mmol/L  CBG monitoring, ED     Status: Abnormal   Collection Time: 07/02/24 10:09 PM  Result Value Ref Range   Glucose-Capillary 281 (H) 70 - 99 mg/dL     Imaging Orders         DG Chest Port 1 View    1. No acute cardiopulmonary abnormality. 2. Tracheostomy tube and right IJ dual lumen central venous catheter in place. 3. Percutaneous gastrojejunostomy tube likely coiled within the gastric lumen.  CT ABDOMEN PELVIS WO CONTRAST    1. No evidence of bowel obstruction. 2. Acute calculus cholecystitis. 3. New peribronchovascular nodule or infiltrate within the visualized right lung base, likely related to acute infection. 4. Numerous nonobstructing renal calculi bilaterally measuring up to 8 mm, without hydronephrosis. 5. Marked atrophy of the skeletal musculature of the retroperitoneum, paraspinal region, and lower extremities bilaterally in keeping with the patient's known als .       IR Radiologist Eval & Mgmt      Assessment and Plan   Jerry [name] is an 53 y.o. male with recent acute cholecystitis treated with percutaneous cholecystostomy tube which was removed in November.  He presents to the hospital today in DKA, without a history of diabetes.  He has continued purulent drainage from his  cholecystostomy tube site.  He does not have significant epigastric tenderness on exam.  I recommend starting broad spectrum antibiotics.  I would consult IR to see if they feel the percutaneous drain should be replaced.  Based on the appearance of the wound and the CT, I feel replacement would be reasonable, and may provide source control if the gallbladder is provoking the DKA.   Deward JINNY Foy, MD  [facility] Use [email or web address] to contact on call provider  New Patient Billing: [phone] - High MDM      [1]  Allergies Allergen Reactions   Crab [Shellfish Allergy] Rash    Confirmed no allergy to fish with wife 02/28/24   "

## 2024-07-02 NOTE — ED Triage Notes (Signed)
 [facility] reports pt coming from home. Wife is primary caretaker. Pt has ALS and has trach. Wife reports pt has been more fatigued and sleeping more, reduced secretions, constipated x7days, blurred vision and abdominal distention. Pt blood sugar by EMS was also noted 400mg /dl and pt is not diabetic. Pt denies any pain or discomfort. Pt is having some difficulty breathing.

## 2024-07-02 NOTE — Progress Notes (Signed)
" °   07/02/24 2355  Suction Method  Respiratory Interventions Airway suction  Airway Suctioning/Secretions  Suction Type Tracheal  Suction Device  Catheter  Secretion Amount Moderate  Secretion Color Yellow;Tan  Secretion Consistency Thick  Suction Tolerance Tolerated well  Suctioning Adverse Effects None   Tracheal lavage produced moderate amount of thick yellow secretions. Pt was asked if he is breathing easier now and he raised his eyebrows twice indicating a yes per wife. Inner cannula also changed.  "

## 2024-07-02 NOTE — ED Notes (Signed)
 Patient's port accessed, at request of spouse, with sterile technique. Port flushes and blood return noted.

## 2024-07-03 ENCOUNTER — Encounter (HOSPITAL_COMMUNITY): Payer: Self-pay | Admitting: Internal Medicine

## 2024-07-03 ENCOUNTER — Inpatient Hospital Stay (HOSPITAL_COMMUNITY)

## 2024-07-03 DIAGNOSIS — N19 Unspecified kidney failure: Secondary | ICD-10-CM | POA: Insufficient documentation

## 2024-07-03 DIAGNOSIS — Z794 Long term (current) use of insulin: Secondary | ICD-10-CM | POA: Diagnosis not present

## 2024-07-03 DIAGNOSIS — I471 Supraventricular tachycardia, unspecified: Secondary | ICD-10-CM | POA: Diagnosis present

## 2024-07-03 DIAGNOSIS — Z95828 Presence of other vascular implants and grafts: Secondary | ICD-10-CM | POA: Diagnosis not present

## 2024-07-03 DIAGNOSIS — G8250 Quadriplegia, unspecified: Secondary | ICD-10-CM | POA: Diagnosis present

## 2024-07-03 DIAGNOSIS — Z833 Family history of diabetes mellitus: Secondary | ICD-10-CM | POA: Diagnosis not present

## 2024-07-03 DIAGNOSIS — Z930 Tracheostomy status: Secondary | ICD-10-CM

## 2024-07-03 DIAGNOSIS — J9610 Chronic respiratory failure, unspecified whether with hypoxia or hypercapnia: Secondary | ICD-10-CM | POA: Diagnosis present

## 2024-07-03 DIAGNOSIS — R739 Hyperglycemia, unspecified: Secondary | ICD-10-CM | POA: Insufficient documentation

## 2024-07-03 DIAGNOSIS — E860 Dehydration: Secondary | ICD-10-CM | POA: Diagnosis present

## 2024-07-03 DIAGNOSIS — E870 Hyperosmolality and hypernatremia: Secondary | ICD-10-CM | POA: Diagnosis present

## 2024-07-03 DIAGNOSIS — T85590A Other mechanical complication of bile duct prosthesis, initial encounter: Secondary | ICD-10-CM | POA: Diagnosis present

## 2024-07-03 DIAGNOSIS — Z9911 Dependence on respirator [ventilator] status: Secondary | ICD-10-CM | POA: Diagnosis not present

## 2024-07-03 DIAGNOSIS — E1110 Type 2 diabetes mellitus with ketoacidosis without coma: Secondary | ICD-10-CM

## 2024-07-03 DIAGNOSIS — G1221 Amyotrophic lateral sclerosis: Secondary | ICD-10-CM | POA: Diagnosis present

## 2024-07-03 DIAGNOSIS — E119 Type 2 diabetes mellitus without complications: Secondary | ICD-10-CM

## 2024-07-03 DIAGNOSIS — K117 Disturbances of salivary secretion: Secondary | ICD-10-CM | POA: Diagnosis present

## 2024-07-03 DIAGNOSIS — Z8249 Family history of ischemic heart disease and other diseases of the circulatory system: Secondary | ICD-10-CM | POA: Diagnosis not present

## 2024-07-03 DIAGNOSIS — K823 Fistula of gallbladder: Secondary | ICD-10-CM | POA: Diagnosis present

## 2024-07-03 DIAGNOSIS — Z1152 Encounter for screening for COVID-19: Secondary | ICD-10-CM | POA: Diagnosis not present

## 2024-07-03 DIAGNOSIS — N179 Acute kidney failure, unspecified: Secondary | ICD-10-CM | POA: Diagnosis present

## 2024-07-03 DIAGNOSIS — K819 Cholecystitis, unspecified: Secondary | ICD-10-CM | POA: Diagnosis present

## 2024-07-03 DIAGNOSIS — Z8616 Personal history of COVID-19: Secondary | ICD-10-CM | POA: Diagnosis not present

## 2024-07-03 DIAGNOSIS — Y848 Other medical procedures as the cause of abnormal reaction of the patient, or of later complication, without mention of misadventure at the time of the procedure: Secondary | ICD-10-CM | POA: Diagnosis present

## 2024-07-03 DIAGNOSIS — N200 Calculus of kidney: Secondary | ICD-10-CM | POA: Diagnosis present

## 2024-07-03 DIAGNOSIS — K5900 Constipation, unspecified: Secondary | ICD-10-CM | POA: Diagnosis present

## 2024-07-03 DIAGNOSIS — K8000 Calculus of gallbladder with acute cholecystitis without obstruction: Secondary | ICD-10-CM | POA: Diagnosis present

## 2024-07-03 DIAGNOSIS — I493 Ventricular premature depolarization: Secondary | ICD-10-CM | POA: Diagnosis present

## 2024-07-03 DIAGNOSIS — N319 Neuromuscular dysfunction of bladder, unspecified: Secondary | ICD-10-CM | POA: Diagnosis present

## 2024-07-03 DIAGNOSIS — E861 Hypovolemia: Secondary | ICD-10-CM | POA: Diagnosis present

## 2024-07-03 DIAGNOSIS — Y828 Other medical devices associated with adverse incidents: Secondary | ICD-10-CM | POA: Diagnosis present

## 2024-07-03 HISTORY — PX: IR PERC CHOLECYSTOSTOMY: IMG2326

## 2024-07-03 LAB — COMPREHENSIVE METABOLIC PANEL WITH GFR
ALT: 150 U/L — ABNORMAL HIGH (ref 0–44)
AST: 54 U/L — ABNORMAL HIGH (ref 15–41)
Albumin: 4.1 g/dL (ref 3.5–5.0)
Alkaline Phosphatase: 421 U/L — ABNORMAL HIGH (ref 38–126)
Anion gap: 16 — ABNORMAL HIGH (ref 5–15)
BUN: 15 mg/dL (ref 6–20)
CO2: 18 mmol/L — ABNORMAL LOW (ref 22–32)
Calcium: 10 mg/dL (ref 8.9–10.3)
Chloride: 122 mmol/L — ABNORMAL HIGH (ref 98–111)
Creatinine, Ser: 0.5 mg/dL — ABNORMAL LOW (ref 0.61–1.24)
GFR, Estimated: >60 mL/min
Glucose, Bld: 188 mg/dL — ABNORMAL HIGH (ref 70–99)
Potassium: 3.2 mmol/L — ABNORMAL LOW (ref 3.5–5.1)
Sodium: 156 mmol/L — ABNORMAL HIGH (ref 135–145)
Total Bilirubin: 1.2 mg/dL (ref 0.0–1.2)
Total Protein: 8.6 g/dL — ABNORMAL HIGH (ref 6.5–8.1)

## 2024-07-03 LAB — CBC WITH DIFFERENTIAL/PLATELET
Abs Immature Granulocytes: 0.04 K/uL (ref 0.00–0.07)
Basophils Absolute: 0.1 K/uL (ref 0.0–0.1)
Basophils Relative: 1 %
Eosinophils Absolute: 0.2 K/uL (ref 0.0–0.5)
Eosinophils Relative: 2 %
HCT: 41.9 % (ref 39.0–52.0)
Hemoglobin: 12.7 g/dL — ABNORMAL LOW (ref 13.0–17.0)
Immature Granulocytes: 0 %
Lymphocytes Relative: 19 %
Lymphs Abs: 2.3 K/uL (ref 0.7–4.0)
MCH: 25 pg — ABNORMAL LOW (ref 26.0–34.0)
MCHC: 30.3 g/dL (ref 30.0–36.0)
MCV: 82.5 fL (ref 80.0–100.0)
Monocytes Absolute: 0.7 K/uL (ref 0.1–1.0)
Monocytes Relative: 6 %
Neutro Abs: 8.8 K/uL — ABNORMAL HIGH (ref 1.7–7.7)
Neutrophils Relative %: 72 %
Platelets: 182 K/uL (ref 150–400)
RBC: 5.08 MIL/uL (ref 4.22–5.81)
RDW: 20.5 % — ABNORMAL HIGH (ref 11.5–15.5)
WBC: 12.1 K/uL — ABNORMAL HIGH (ref 4.0–10.5)
nRBC: 0 % (ref 0.0–0.2)

## 2024-07-03 LAB — CREATININE, URINE, RANDOM: Creatinine, Urine: 17 mg/dL

## 2024-07-03 LAB — BLOOD CULTURE ID PANEL (REFLEXED) - BCID2

## 2024-07-03 LAB — SODIUM, URINE, RANDOM: Sodium, Ur: 31 mmol/L

## 2024-07-03 LAB — BASIC METABOLIC PANEL WITH GFR
Anion gap: 12 (ref 5–15)
BUN: 14 mg/dL (ref 6–20)
CO2: 19 mmol/L — ABNORMAL LOW (ref 22–32)
Calcium: 9.9 mg/dL (ref 8.9–10.3)
Chloride: 122 mmol/L — ABNORMAL HIGH (ref 98–111)
Creatinine, Ser: 0.49 mg/dL — ABNORMAL LOW (ref 0.61–1.24)
GFR, Estimated: >60 mL/min
Glucose, Bld: 190 mg/dL — ABNORMAL HIGH (ref 70–99)
Potassium: 3.9 mmol/L (ref 3.5–5.1)
Sodium: 153 mmol/L — ABNORMAL HIGH (ref 135–145)

## 2024-07-03 LAB — GLUCOSE, CAPILLARY
Glucose-Capillary: 148 mg/dL — ABNORMAL HIGH (ref 70–99)
Glucose-Capillary: 156 mg/dL — ABNORMAL HIGH (ref 70–99)
Glucose-Capillary: 173 mg/dL — ABNORMAL HIGH (ref 70–99)
Glucose-Capillary: 157 mg/dL — ABNORMAL HIGH (ref 70–99)
Glucose-Capillary: 177 mg/dL — ABNORMAL HIGH (ref 70–99)
Glucose-Capillary: 184 mg/dL — ABNORMAL HIGH (ref 70–99)
Glucose-Capillary: 164 mg/dL — ABNORMAL HIGH (ref 70–99)
Glucose-Capillary: 179 mg/dL — ABNORMAL HIGH (ref 70–99)
Glucose-Capillary: 191 mg/dL — ABNORMAL HIGH (ref 70–99)
Glucose-Capillary: 196 mg/dL — ABNORMAL HIGH (ref 70–99)
Glucose-Capillary: 156 mg/dL — ABNORMAL HIGH (ref 70–99)

## 2024-07-03 LAB — HEMOGLOBIN A1C
Hgb A1c MFr Bld: 8.8 % — ABNORMAL HIGH (ref 4.8–5.6)
Mean Plasma Glucose: 205.86 mg/dL

## 2024-07-03 LAB — BETA-HYDROXYBUTYRIC ACID
Beta-Hydroxybutyric Acid: 2.16 mmol/L — ABNORMAL HIGH (ref 0.05–0.27)
Beta-Hydroxybutyric Acid: 0.63 mmol/L — ABNORMAL HIGH (ref 0.05–0.27)

## 2024-07-03 LAB — CBG MONITORING, ED: Glucose-Capillary: 200 mg/dL — ABNORMAL HIGH (ref 70–99)

## 2024-07-03 LAB — MAGNESIUM: Magnesium: 2.1 mg/dL (ref 1.7–2.4)

## 2024-07-03 LAB — OSMOLALITY, URINE: Osmolality, Ur: 816 mosm/kg (ref 300–900)

## 2024-07-03 LAB — PHOSPHORUS: Phosphorus: 1.6 mg/dL — ABNORMAL LOW (ref 2.5–4.6)

## 2024-07-03 MED ORDER — FENTANYL CITRATE (PF) 100 MCG/2ML IJ SOLN
INTRAMUSCULAR | Status: AC | PRN
  Administered 2024-07-03 (×2): 25 ug via INTRAVENOUS
  Administered 2024-07-03: 50 ug via INTRAVENOUS

## 2024-07-03 MED ORDER — METOPROLOL TARTRATE 5 MG/5ML IV SOLN
INTRAVENOUS | Status: AC
  Filled 2024-07-03: qty 5

## 2024-07-03 MED ORDER — ORAL CARE MOUTH RINSE
15.0000 mL | OROMUCOSAL | Status: DC
  Administered 2024-07-03 – 2024-07-07 (×48): 15 mL via OROMUCOSAL

## 2024-07-03 MED ORDER — SODIUM CHLORIDE 0.9% FLUSH
5.0000 mL | Freq: Three times a day (TID) | INTRAVENOUS | Status: DC
  Administered 2024-07-03 – 2024-07-07 (×11): 5 mL

## 2024-07-03 MED ORDER — MIDAZOLAM HCL 2 MG/2ML IJ SOLN
INTRAMUSCULAR | Status: AC
  Filled 2024-07-03: qty 2

## 2024-07-03 MED ORDER — LIDOCAINE HCL 1 % IJ SOLN
20.0000 mL | Freq: Once | INTRAMUSCULAR | Status: AC
  Administered 2024-07-03: 4 mL via INTRADERMAL
  Filled 2024-07-03: qty 20

## 2024-07-03 MED ORDER — IOHEXOL 300 MG/ML  SOLN
50.0000 mL | Freq: Once | INTRAMUSCULAR | Status: AC | PRN
  Administered 2024-07-03: 20 mL

## 2024-07-03 MED ORDER — MIDAZOLAM HCL (PF) 2 MG/2ML IJ SOLN
INTRAMUSCULAR | Status: AC | PRN
  Administered 2024-07-03: .5 mg via INTRAVENOUS
  Administered 2024-07-03: 1 mg via INTRAVENOUS

## 2024-07-03 MED ORDER — IPRATROPIUM-ALBUTEROL 0.5-2.5 (3) MG/3ML IN SOLN
3.0000 mL | Freq: Two times a day (BID) | RESPIRATORY_TRACT | Status: DC
  Administered 2024-07-03 – 2024-07-07 (×10): 3 mL via RESPIRATORY_TRACT
  Filled 2024-07-03 (×9): qty 3

## 2024-07-03 MED ORDER — METOPROLOL TARTRATE 5 MG/5ML IV SOLN: 5.0000 mg | Freq: Once | INTRAVENOUS | Status: DC

## 2024-07-03 MED ORDER — SODIUM CHLORIDE 0.9% FLUSH
10.0000 mL | Freq: Two times a day (BID) | INTRAVENOUS | Status: DC
  Administered 2024-07-03 – 2024-07-07 (×8): 10 mL

## 2024-07-03 MED ORDER — INSULIN ASPART 100 UNIT/ML IJ SOLN
0.0000 [IU] | INTRAMUSCULAR | Status: DC
  Administered 2024-07-03 (×4): 3 [IU] via SUBCUTANEOUS
  Administered 2024-07-04: 2 [IU] via SUBCUTANEOUS
  Administered 2024-07-04 (×2): 3 [IU] via SUBCUTANEOUS
  Administered 2024-07-04 (×2): 2 [IU] via SUBCUTANEOUS
  Administered 2024-07-04 – 2024-07-05 (×5): 3 [IU] via SUBCUTANEOUS
  Administered 2024-07-05 – 2024-07-06 (×8): 5 [IU] via SUBCUTANEOUS
  Administered 2024-07-07: 3 [IU] via SUBCUTANEOUS
  Administered 2024-07-07: 5 [IU] via SUBCUTANEOUS
  Administered 2024-07-07: 3 [IU] via SUBCUTANEOUS
  Filled 2024-07-03: qty 3
  Filled 2024-07-03: qty 5
  Filled 2024-07-03: qty 3
  Filled 2024-07-03 (×3): qty 5
  Filled 2024-07-03: qty 2
  Filled 2024-07-03: qty 3
  Filled 2024-07-03: qty 2
  Filled 2024-07-03 (×2): qty 3
  Filled 2024-07-03: qty 2
  Filled 2024-07-03 (×3): qty 3
  Filled 2024-07-03: qty 5
  Filled 2024-07-03 (×2): qty 3
  Filled 2024-07-03 (×2): qty 5
  Filled 2024-07-03: qty 3
  Filled 2024-07-03: qty 2
  Filled 2024-07-03 (×2): qty 5
  Filled 2024-07-03: qty 3
  Filled 2024-07-03: qty 5

## 2024-07-03 MED ORDER — SODIUM CHLORIDE 0.9% FLUSH: 10.0000 mL | INTRAVENOUS | Status: DC | PRN

## 2024-07-03 MED ORDER — INSULIN GLARGINE 100 UNIT/ML ~~LOC~~ SOLN
5.0000 [IU] | Freq: Every day | SUBCUTANEOUS | Status: DC
  Administered 2024-07-03 – 2024-07-05 (×3): 5 [IU] via SUBCUTANEOUS
  Filled 2024-07-03 (×4): qty 0.05

## 2024-07-03 MED ORDER — CHLORHEXIDINE GLUCONATE CLOTH 2 % EX PADS
6.0000 | MEDICATED_PAD | Freq: Every day | CUTANEOUS | Status: DC
  Administered 2024-07-03 – 2024-07-07 (×5): 6 via TOPICAL

## 2024-07-03 MED ORDER — DEXTROSE-SODIUM CHLORIDE 5-0.45 % IV SOLN: INTRAVENOUS | Status: DC

## 2024-07-03 MED ORDER — ORAL CARE MOUTH RINSE: 15.0000 mL | OROMUCOSAL | Status: DC | PRN

## 2024-07-03 MED ORDER — POTASSIUM CHLORIDE 10 MEQ/100ML IV SOLN
10.0000 meq | INTRAVENOUS | Status: AC
  Administered 2024-07-03 (×4): 10 meq via INTRAVENOUS
  Filled 2024-07-03 (×4): qty 100

## 2024-07-03 MED ORDER — LIDOCAINE HCL 1 % IJ SOLN
INTRAMUSCULAR | Status: AC
  Filled 2024-07-03: qty 20

## 2024-07-03 MED ORDER — ENOXAPARIN SODIUM 40 MG/0.4ML IJ SOSY
40.0000 mg | PREFILLED_SYRINGE | INTRAMUSCULAR | Status: DC
  Administered 2024-07-03 – 2024-07-06 (×4): 40 mg via SUBCUTANEOUS
  Filled 2024-07-03 (×4): qty 0.4

## 2024-07-03 MED ORDER — FENTANYL CITRATE (PF) 100 MCG/2ML IJ SOLN
INTRAMUSCULAR | Status: AC
  Filled 2024-07-03: qty 2

## 2024-07-03 NOTE — Assessment & Plan Note (Addendum)
-   Initially underwent percutaneous cholecystostomy tube placement on 02/26/2024 when initially diagnosed with acute cholecystitis. -Recent tube exchange on 05/29/2024 and at some point after that has become removed - Underwent consult with general surgery again on admission with recommendation for repeat placement of tube given CT showing acute calculous cholecystitis - Cholecystostomy tube replaced by IR on 07/03/2024 -Continue Unasyn  (previously on Zosyn ); will transition to Augmentin  to complete course at discharge -Tube feeds resumed at trickle rate 12/31; uptitrated on 1/1; tolerated well

## 2024-07-03 NOTE — Assessment & Plan Note (Signed)
-   Due to dehydration and volume depletion - Improved with fluids

## 2024-07-03 NOTE — Progress Notes (Signed)
 Pt transported from ED6 to 2M08 without complications.

## 2024-07-03 NOTE — Plan of Care (Signed)
  Problem: Activity: Goal: Ability to tolerate increased activity will improve Outcome: Progressing   Problem: Respiratory: Goal: Ability to maintain a clear airway and adequate ventilation will improve Outcome: Progressing   Problem: Role Relationship: Goal: Method of communication will improve Outcome: Progressing   Problem: Education: Goal: Knowledge of General Education information will improve Description: Including pain rating scale, medication(s)/side effects and non-pharmacologic comfort measures Outcome: Progressing   Problem: Health Behavior/Discharge Planning: Goal: Ability to manage health-related needs will improve Outcome: Progressing   Problem: Clinical Measurements: Goal: Ability to maintain clinical measurements within normal limits will improve Outcome: Progressing Goal: Will remain free from infection Outcome: Progressing Goal: Diagnostic test results will improve Outcome: Progressing Goal: Respiratory complications will improve Outcome: Progressing Goal: Cardiovascular complication will be avoided Outcome: Progressing   Problem: Activity: Goal: Risk for activity intolerance will decrease Outcome: Progressing   Problem: Nutrition: Goal: Adequate nutrition will be maintained Outcome: Progressing   Problem: Coping: Goal: Level of anxiety will decrease Outcome: Progressing   Problem: Elimination: Goal: Will not experience complications related to bowel motility Outcome: Progressing Goal: Will not experience complications related to urinary retention Outcome: Progressing   Problem: Pain Managment: Goal: General experience of comfort will improve and/or be controlled Outcome: Progressing   Problem: Safety: Goal: Ability to remain free from injury will improve Outcome: Progressing   Problem: Skin Integrity: Goal: Risk for impaired skin integrity will decrease Outcome: Progressing

## 2024-07-03 NOTE — Progress Notes (Signed)
 Pt transported to IR and back to 2M08 via home ventilator without complications.

## 2024-07-03 NOTE — Assessment & Plan Note (Signed)
-   S/p IV fluids with improvement in BUN

## 2024-07-03 NOTE — Assessment & Plan Note (Signed)
-   Dependent on ventilator and PEG tube for nutrition -Cared for by wife at home.  Also has home care RN

## 2024-07-03 NOTE — Consult Note (Signed)
 "     Chief Complaint: Patient was seen in consultation today for acute cholecystitis  Referring Physician(s): Dr. Deward Scrivener  Supervising Physician: Philip Cornet  Patient Status: [facility] - Out-pt  History of Present Illness: Jerry [name] is a 53 y.o. male known to IR from recent acute cholecystitis for which he underwent percutaneous cholecystostomy tube placement 02/28/24.  This remains for several months and during an unrelated hospitalization 05/28/24, his drain was interrogated showing mucosal irregularity throughout the gallbladder lumen with patent appearing cystic and common bile ducts. A capping trial was initiated, however was only continued for 2 days as patient/team requested removal of the tube.  This was done 05/30/24 without issue.  Unfortunately, patient returns today with DKA.  CT Abdomen Pelvis shows acute acalculous cholecystitis.  Patient has been evaluated by CCS who is recommending drain replacement. This has been approved by Dr. Philip.   Patient assessed at bedside.  Despite ALS dx, he is communicative and uses a device for type-to-speak.  Denies abdominal pain, nausea.  Reports site without issue as far as he is concerned.  He prefers no tube replacement if possible.    Past Medical History:  Diagnosis Date   ALS (amyotrophic lateral sclerosis) (HCC)    COVID-19 virus infection 06/2020   Meningitis 2003   spinal   Morbid obesity (HCC)    Neuromuscular disorder (HCC)    Prediabetes 11/05/2016   A1C 5.7 on 11/05/16   Type 2 diabetes mellitus (HCC) 08/31/2023    Past Surgical History:  Procedure Laterality Date   IR CHOLANGIOGRAM EXISTING TUBE  02/29/2024   IR CHOLANGIOGRAM EXISTING TUBE  05/29/2024   IR GJ TUBE CHANGE  03/01/2024   IR GJ TUBE CHANGE  04/06/2024   IR GJ TUBE CHANGE  05/29/2024   IR PERC CHOLECYSTOSTOMY  02/26/2024   IR REPLACE G-TUBE SIMPLE WO FLUORO  06/22/2022   MASS EXCISION Right 11/26/2014   Procedure: EXCISION MASS/GROIN EXCISION  MASS;  Surgeon: Reyes LELON Cota, MD;  Location: [facility] ORS;  Service: General;  Laterality: Right;   MASS EXCISION Right 11/26/2014   Procedure: MINOR EXCISION OF MASS/POST THIGH MASS;  Surgeon: Reyes LELON Cota, MD;  Location: [facility] ORS;  Service: General;  Laterality: Right;   PEG PLACEMENT  10/27/2021   PORTACATH PLACEMENT Right 06/19/2019    Allergies: Georgianne cerise allergy]  Medications: Prior to Admission medications  Medication Sig Start Date End Date Taking? Authorizing Provider  acetaminophen  (TYLENOL ) 160 MG/5ML solution Place 30 mLs into feeding tube every 6 (six) hours as needed for fever.   Yes [provider]  cetirizine  HCl (ZYRTEC ) 5 MG/5ML SOLN Take 10 mLs (10 mg total) by mouth at bedtime. Patient taking differently: Place 10 mLs into feeding tube at bedtime. 07/27/23  Yes Sowles, Krichna, MD  docusate (COLACE) 50 MG/5ML liquid Place 10 mLs into feeding tube daily as needed (constipation).   Yes [provider]  glycopyrrolate  (CUVPOSA ) 0.2 mg/ml SOLN Place 5 mLs (1,000 mcg total) into feeding tube 2 (two) times daily. 04/08/24  Yes Jillian Buttery, MD  ipratropium-albuterol  (DUONEB) 0.5-2.5 (3) MG/3ML SOLN USE 3 ML VIA NEBULIZER EVERY 4 HOURS AS NEEDED 11/04/23  Yes Sowles, Krichna, MD  metoCLOPramide  (REGLAN ) 5 MG/5ML solution Place 10 mLs (10 mg total) into feeding tube every 8 (eight) hours as needed for nausea. 04/08/24  Yes Adhikari, Amrit, MD  polyethylene glycol powder (GLYCOLAX /MIRALAX ) 17 GM/SCOOP powder Place 17 g into feeding tube daily. Titrate as needed for constipation Patient taking  differently: Place 17 g into feeding tube daily as needed for mild constipation. Titrate as needed for constipation 07/27/23  Yes Sowles, Krichna, MD  Polyvinyl Alcohol -Povidone (CLEAR EYES NATURAL TEARS) 5-6 MG/ML SOLN Apply 1 drop to eye 3 (three) times daily as needed. 07/27/23  Yes Sowles, Krichna, MD  scopolamine  (TRANSDERM-SCOP) 1 MG/3DAYS PLACE 1 PATCH ONTO THE  SKIN EVERY 3 DAYS 08/18/23  Yes Sowles, Krichna, MD  Simethicone  40 MG/0.6ML LIQD 1.2 mLs (80 mg total) by Per J Tube route in the morning, at noon, in the evening, and at bedtime. Patient taking differently: 80 mg by Per J Tube route daily as needed. 03/12/24  Yes Gonfa, Taye T, MD  Water  For Irrigation, Sterile (FREE WATER ) SOLN Place 125 mLs into feeding tube every 6 (six) hours. Patient taking differently: Place 125 mLs into feeding tube every 2 (two) hours. 03/12/24  Yes Kathrin Mignon DASEN, MD     Family History  Problem Relation Age of Onset   Diabetes Father    Diabetes Mother    Diabetes Sister    Hypertension Sister    Diabetes Brother    Diabetes Sister    Fibromyalgia Sister    Diabetes Sister     Social History   Socioeconomic History   Marital status: Married    Spouse name: [name]   Number of children: 3   Years of education: Not on file   Highest education level: High school graduate  Occupational History   Occupation: biomedical scientist    Comment: self employed   Occupation: consulting civil engineer    Comment: [employer]  Tobacco Use   Smoking status: Never   Smokeless tobacco: Never  Advertising Account Planner   Vaping status: Never Used  Substance and Sexual Activity   Alcohol  use: No    Alcohol /week: 0.0 standard drinks of alcohol    Drug use: No   Sexual activity: Yes    Partners: Female  Other Topics Concern   Not on file  Social History Narrative   Lives with wife and 3 children   Approved for disability 02/2019 for ALS    Social Drivers of Health   Tobacco Use: Low Risk (07/03/2024)   Patient History    Smoking Tobacco Use: Never    Smokeless Tobacco Use: Never    Passive Exposure: Not on file  Financial Resource Strain: Low Risk (09/29/2022)   Overall Financial Resource Strain (CARDIA)    Difficulty of Paying Living Expenses: Not very hard  Food Insecurity: No Food Insecurity (07/03/2024)   Epic    Worried About Programme Researcher, Broadcasting/film/video in the Last Year: Never true    Ran Out of  Food in the Last Year: Never true  Transportation Needs: No Transportation Needs (07/03/2024)   Epic    Lack of Transportation (Medical): No    Lack of Transportation (Non-Medical): No  Physical Activity: Inactive (10/11/2023)   Exercise Vital Sign    Days of Exercise per Week: 0 days    Minutes of Exercise per Session: 0 min  Stress: Stress Concern Present (10/11/2023)   [employer] of Occupational Health - Occupational Stress Questionnaire    Feeling of Stress : To some extent  Social Connections: Unknown (10/11/2023)   Social Connection and Isolation Panel    Frequency of Communication with Friends and Family: More than three times a week    Frequency of Social Gatherings with Friends and Family: More than three times a week    Attends Religious Services: Not on file  Active Member of Clubs or Organizations: No    Attends Banker Meetings: Never    Marital Status: Married  Depression (PHQ2-9): Low Risk (01/24/2024)   Depression (PHQ2-9)    PHQ-2 Score: 0  Alcohol  Screen: Not on file  Housing: Low Risk (07/03/2024)   Epic    Unable to Pay for Housing in the Last Year: No    Number of Times Moved in the Last Year: 0    Homeless in the Last Year: No  Utilities: Not At Risk (07/03/2024)   Epic    Threatened with loss of utilities: No  Health Literacy: Adequate Health Literacy (10/11/2023)   B1300 Health Literacy    Frequency of need for help with medical instructions: Rarely     Review of Systems: A 12 point ROS discussed and pertinent positives are indicated in the HPI above.  All other systems are negative.  Review of Systems  Constitutional:  Negative for fatigue and fever.  Gastrointestinal:  Negative for abdominal pain, nausea and vomiting.  Psychiatric/Behavioral:  Negative for behavioral problems and confusion.     Vital Signs: BP 136/86   Pulse (!) 115   Temp 99.1 F (37.3 C) (Oral)   Resp 16   Ht 5' 5 (1.651 m)   Wt 164 lb 14.5 oz (74.8 kg)    SpO2 100%   BMI 27.44 kg/m   Physical Exam Vitals and nursing note reviewed.  Constitutional:      General: He is not in acute distress.    Appearance: He is ill-appearing.     Comments: Trach vent, communicative with device  HENT:     Mouth/Throat:     Mouth: Mucous membranes are dry.  Neck:     Comments: Trach in place Cardiovascular:     Rate and Rhythm: Tachycardia present.  Abdominal:     General: There is no distension.     Palpations: Abdomen is soft.     Comments: Perc chole site visible.  Actively draining was appears to be cloudy/dark bile with some oozing.   Skin:    General: Skin is warm and dry.  Neurological:     General: No focal deficit present.     Mental Status: He is alert and oriented to person, place, and time. Mental status is at baseline.  Psychiatric:        Mood and Affect: Mood normal.        Behavior: Behavior normal.        Thought Content: Thought content normal.        Judgment: Judgment normal.      MD Evaluation Airway: Other (comments) Airway comments: trach/vent Heart: WNL Abdomen: Other (comments) Abdomen comments: cholecystitis Chest/ Lungs: Other (comments) Chest/ lungs comments: vent ASA  Classification: 3 Mallampati/Airway Score: Two   Imaging: CT ABDOMEN PELVIS WO CONTRAST Result Date: 07/02/2024 EXAM: CT ABDOMEN AND PELVIS WITHOUT CONTRAST 07/02/2024 07:26:20 PM TECHNIQUE: CT of the abdomen and pelvis was performed without the administration of intravenous contrast. Multiplanar reformatted images are provided for review. Automated exposure control, iterative reconstruction, and/or weight-based adjustment of the mA/kV was utilized to reduce the radiation dose to as low as reasonably achievable. COMPARISON: Prior examination of 02/26/2024. CLINICAL HISTORY: Bowel obstruction suspected. FINDINGS: LOWER CHEST: Peribronchovascular nodule or infiltrate within the visualized right lung base is new from prior examination and likely  relates to acute infection. Trace left pleural effusion with associated left basilar atelectasis. LIVER: The liver is unremarkable. GALLBLADDER AND BILE DUCTS: A  small amount of layering calcific debris is noted within the gallbladder fundus. The gallbladder now demonstrates marked diffuse wall thickening and extensive pericholecystic inflammatory stranding in keeping with changes of acute calculus cholecystitis. No biliary ductal dilatation. SPLEEN: No acute abnormality. PANCREAS: No acute abnormality. ADRENAL GLANDS: No acute abnormality. KIDNEYS, URETERS AND BLADDER: Numerous nonobstructing calculi are seen within the kidneys bilaterally measuring up to 8 mm. No ureteral calculi. No hydronephrosis. No perinephric inflammatory stranding or fluid collections were identified. The bladder is unremarkable. GI AND BOWEL: Retention gastrojejunostomies are in place with a catheter coiled within the gastric lumen. Appendix is normal. The stomach, small bowel, and large bowel are otherwise unremarkable. There is no bowel obstruction. PERITONEUM AND RETROPERITONEUM: No ascites. No free air. VASCULATURE: Aorta is normal in caliber. LYMPH NODES: No lymphadenopathy. REPRODUCTIVE ORGANS: No acute abnormality. BONES AND SOFT TISSUES: Osseous structures are age appropriate. No acute bone abnormality. No lytic or blastic bone lesion. Marked atrophy of the skeletal musculature of the retroperitoneum, paraspinal region, and lower extremities bilaterally. IMPRESSION: 1. No evidence of bowel obstruction. 2. Acute calculus cholecystitis. 3. New peribronchovascular nodule or infiltrate within the visualized right lung base, likely related to acute infection. 4. Numerous nonobstructing renal calculi bilaterally measuring up to 8 mm, without hydronephrosis. 5. Marked atrophy of the skeletal musculature of the retroperitoneum, paraspinal region, and lower extremities bilaterally in keeping with the patient's known als . Electronically  signed by: Dorethia Molt MD 07/02/2024 09:17 PM EST RP Workstation: [id number]   DG Chest Port 1 View Result Date: 07/02/2024 EXAM: 1 VIEW(S) XRAY OF THE CHEST 07/02/2024 06:35:00 PM COMPARISON: 05/28/2024 CLINICAL HISTORY: Questionable sepsis - evaluate for abnormality FINDINGS: LINES, TUBES AND DEVICES: Tracheostomy tube in place over tracheal air column. Right IJ dual lumen central venous catheter with tip at right atrium. Percutaneous gastrojejunostomy tube in upper abdomen, likely coiled within the gastric lumen. LUNGS AND PLEURA: Low lung volumes. Mildly elevated left hemidiaphragm. No focal pulmonary opacity. No pleural effusion. No pneumothorax. HEART AND MEDIASTINUM: No acute abnormality of the cardiac and mediastinal silhouettes. BONES AND SOFT TISSUES: No acute osseous abnormality. IMPRESSION: 1. No acute cardiopulmonary abnormality. 2. Tracheostomy tube and right IJ dual lumen central venous catheter in place. 3. Percutaneous gastrojejunostomy tube likely coiled within the gastric lumen. Electronically signed by: Dorethia Molt MD 07/02/2024 08:47 PM EST RP Workstation: [id number]    Labs:  CBC: Recent Labs    05/29/24 0421 05/30/24 1647 07/02/24 1804 07/02/24 1822 07/02/24 2026 07/03/24 0321  WBC 7.6 6.1 10.7*  --   --  12.1*  HGB 9.8* 9.7* 14.1 15.3 14.6 12.7*  HCT 31.9* 31.7* 47.1 45.0 43.0 41.9  PLT 273 262 163  --   --  182    COAGS: Recent Labs    02/27/24 0441 07/02/24 1804  INR 1.4* 1.0    BMP: Recent Labs    11/11/23 0000 02/26/24 0424 02/26/24 0703 02/26/24 0726 05/29/24 0421 05/30/24 1647 07/02/24 1804 07/02/24 1822 07/02/24 2026 07/03/24 0321  NA  --    < > DUP   < > 141 138 155* 159* 159* 156*  K  --    < > DUP   < > 3.3* 3.5 4.3 4.1 3.7 3.2*  CL  --    < > DUP   < > 110 109 116* 123*  --  122*  CO2  --    < > DUP   < > 18* 18* 13*  --   --  18*  GLUCOSE  --    < > DUP   < > 76 158* 400* 414*  --  188*  BUN  --    < > DUP   < > 8 8 19  21*  --   15  CALCIUM   --    < > DUP   < > 9.9 9.3 10.8*  --   --  10.0  CREATININE  --    < > DUP   < > <0.30* <0.30* 0.65 <0.20*  --  0.50*  GFRNONAA  --    < > DUP   < > NOT CALCULATED NOT CALCULATED >60  --   --  >60  GFRAA 160  --  DUP  --   --   --   --   --   --   --    < > = values in this interval not displayed.    LIVER FUNCTION TESTS: Recent Labs    03/09/24 0404 03/11/24 0226 03/12/24 0225 07/02/24 1804 07/03/24 0321  BILITOT 1.2 1.3*  --  1.3* 1.2  AST 29 23  --  60* 54*  ALT 40 33  --  177* 150*  ALKPHOS 362* 341*  --  497* 421*  PROT 7.5 8.1  --  9.8* 8.6*  ALBUMIN 2.7* 2.9* 2.8* 4.5 4.1    TUMOR MARKERS: No results for input(s): AFPTM, CEA, CA199, CHROMGRNA in the last 8760 hours.  Assessment and Plan: Acute acalculous cholecystits Patient s/p percutaneous cholecystostomy previously in place in August 2025 and removed in November.  Patient now readmitted with DKA potentially stemming from sepsis with concern for acute cholecystitis by CT.  He has been evaluated by [facility]CS who recommends drain replacement.  NPO at present and on insulin  drip.  Site assessed and appears there may be a residual tract as there is drainage with small bloody oozing.   Spent considerable time at bedside allowing patient to ask questions regarding his admission and the current recommendation for replacement.  He does not replacement if at all possible.  He requests I speak with his wife who also is disheartened to hear that tube replacement may be necessary.  She plans to met with patient this AM to discuss.   IR remains available should patient/family agree to percutaneous cholecystostomy.     Thank you for this interesting consult.  I greatly enjoyed meeting ROLLO FARQUHAR and look forward to participating in their care.  A copy of this report was sent to the requesting provider on this date.  Electronically Signed: Reianna Batdorf Sue-Ellen Bently Morath, PA 07/03/2024, 10:11 AM   I spent a total  of 40 Minutes    in face to face in clinical consultation, greater than 50% of which was counseling/coordinating care for acute cholecystitis.   "

## 2024-07-03 NOTE — Procedures (Signed)
 Interventional Radiology Procedure:   Indications:   Procedure:   Findings:    Complications: None     EBL:   Plan:     Quirino Kakos R. Philip, MD  Pager: [phone]  Interventional Radiology Procedure:   Indications: Concern for recurrent cholecystitis and leakage from old cholecystostomy tube tract  Procedure: Replacement of cholecystostomy tube through old tract  Findings: Catheter was advanced into gallbladder from old tract.  Cholangiogram demonstrates an abnormal gallbladder with filling of the duodenum but do not see a normal cystic duct or CBD.   Complications: None     EBL: Minimal  Plan:  Fluid sent for culture.  Routine drain flushing.     Rafael Salway R. Philip, MD  Pager: [phone]

## 2024-07-03 NOTE — Inpatient Diabetes Management (Addendum)
 Inpatient Diabetes Program Recommendations  AACE/ADA: New Consensus Statement on Inpatient Glycemic Control (2015)  Target Ranges:  Prepandial:   less than 140 mg/dL      Peak postprandial:   less than 180 mg/dL (1-2 hours)      Critically ill patients:  140 - 180 mg/dL   Lab Results  Component Value Date   GLUCAP 177 (H) 07/03/2024   HGBA1C 8.8 (H) 07/03/2024    Review of Glycemic Control  Diabetes history: No hx of DM Outpatient Diabetes medications: None Current orders for Inpatient glycemic control:  Lantus  5 units every day Novolog  0-15 units Q4H  Inpatient Diabetes Program Recommendations:    Transitioning off of IV insulin  this morning.   Drip rates 2.6-5 units/hr.  Please consider:  Lantus  15 units every day (administer and allow IV insulin  to infuse for 2 Hrs).  Thank you, Wyvonna Pinal, MSN, CDCES Diabetes Coordinator Inpatient Diabetes Program [phone] (team pager from 8a-5p) '

## 2024-07-03 NOTE — Assessment & Plan Note (Signed)
-   In setting of dehydration and DKA -Continue trending BMP - Free water  flushes modified

## 2024-07-03 NOTE — TOC Initial Note (Signed)
 Transition of Care [facility]) - Initial/Assessment Note    Patient Details  Name: Jerry [name] MRN: [id number] Date of Birth: 1970/07/27  Transition of Care [facility]) CM/SW Contact:    Justina Delcia Czar, RN Phone Number: [phone] 07/03/2024, 2:53 PM  Clinical Narrative:                  Spoke to pt's wife. States she is running out of urine drainage bags and condom catheters.  Contacted [employer] rep, Garrel # [phone] and left message. Will place order prior to dc for supplies to continue at home.  Pt is vent dependent at home and bed bound.  Patient had [facility] in the past. Contacted rep, Arna to find out is patient active.   Has [facility] home based program in place. They provide next business day visits, including labs, diagnostics, medications, disease management, and ongoing monitoring to prevent an unnecessary hospital admission.    Will need ambulance transport home.    Expected Discharge Plan: Home w Home Health Services Barriers to Discharge: Continued Medical Work up   Patient Goals and CMS Choice   CMS Medicare.gov Compare Post Acute Care list provided to:: Patient Represenative (must comment) Choice offered to / list presented to : Spouse      Expected Discharge Plan and Services   Discharge Planning Services: CM Consult Post Acute Care Choice: Home Health Living arrangements for the past 2 months: Single Family Home                                      Prior Living Arrangements/Services Living arrangements for the past 2 months: Single Family Home Lives with:: Spouse Patient language and need for interpreter reviewed:: Yes Do you feel safe going back to the place where you live?: Yes      Need for Family Participation in Patient Care: Yes (Comment) Care giver support system in place?: Yes (comment) Current home services: DME, Homehealth aide (hospital bed, power wheelchair, power lift, aide) Criminal Activity/Legal  Involvement Pertinent to Current Situation/Hospitalization: No - Comment as needed  Activities of Daily Living   ADL Screening (condition at time of admission) Independently performs ADLs?: No Does the patient have a NEW difficulty with bathing/dressing/toileting/self-feeding that is expected to last >3 days?: No Does the patient have a NEW difficulty with getting in/out of bed, walking, or climbing stairs that is expected to last >3 days?: No Does the patient have a NEW difficulty with communication that is expected to last >3 days?: No Is the patient deaf or have difficulty hearing?: No Does the patient have difficulty seeing, even when wearing glasses/contacts?: No Does the patient have difficulty concentrating, remembering, or making decisions?: No  Permission Sought/Granted Permission sought to share information with : Case Manager, Family Supports, PCP Permission granted to share information with : Yes, Verbal Permission Granted  Share Information with NAME: Kie Calvin  Permission granted to share info w AGENCY: DME, Home Health, PCP  Permission granted to share info w Relationship: Spouse  Permission granted to share info w Contact Information: [phone]  Emotional Assessment   Attitude/Demeanor/Rapport: Intubated (Following Commands or Not Following Commands)          Admission diagnosis:  Cholecystitis [K81.9] DKA (diabetic ketoacidosis) (HCC) [E11.10] Diabetic ketoacidosis without coma associated with type 2 diabetes mellitus (HCC) [E11.10] Patient Active Problem List   Diagnosis Date Noted  AKI (acute kidney injury) 07/03/2024   Acute prerenal azotemia 07/03/2024   Acute hypernatremia 07/03/2024   Hypercalcemia 07/03/2024   Hyperglycemia 07/03/2024   Chronic respiratory failure (HCC) 07/02/2024   Gastrostomy tube dysfunction (HCC) 05/30/2024   Malfunction of gastrostomy tube (HCC) 05/28/2024   Malfunction of jejunostomy tube (HCC) 04/06/2024   Normocytic  anemia 04/06/2024   Hypokalemia 04/06/2024   Ventilator dependence (HCC) 04/06/2024   Abdominal pain 02/26/2024   Sepsis (HCC) 02/26/2024   Cholecystitis 02/26/2024   Abnormal liver function tests 01/24/2024   Sequelae of protein-energy malnutrition 01/24/2024   Tetraplegia (HCC) 01/24/2024   DMII (diabetes mellitus, type 2) (HCC) 08/31/2023   On parenteral nutrition 08/31/2023   Failure to thrive in adult 07/27/2023   Intermittent constipation 07/27/2023   On total parenteral nutrition (TPN) 07/27/2023   Primary thrombocytopenia (HCC) 09/25/2022   Chronic respiratory failure with hypoxia (HCC) 06/22/2022   Autonomic dysfunction 01/06/2022   Ventilator dependent (HCC) 01/06/2022   Tracheostomy dependent (HCC) 01/06/2022   Pressure injury of skin 12/27/2021   Anxiety 11/18/2021   Bed sore on buttock, right, unstageable (HCC) 11/18/2021   Severe protein-calorie malnutrition 11/13/2021   Microscopic hematuria 10/29/2021   S/P percutaneous endoscopic gastrostomy (PEG) tube placement (HCC) 10/29/2021   ALS (amyotrophic lateral sclerosis) (HCC) 01/11/2019   PCP:  Franco, [facility]:   [id number] [place], [place] - [address] AT [facility] OF [facility] & [address] [place] [address] Phone: [phone] Fax: [phone]  [facility] [address] [place] [address] Phone: [phone] Fax: [phone]  [place] [facility] [address], [address] [place] [address] Phone: [phone] Fax: [phone]     Social Drivers of Health (SDOH) Social History: SDOH Screenings   Food Insecurity: No Food Insecurity (07/03/2024)  Housing: Low Risk (07/03/2024)  Transportation Needs: No Transportation Needs (07/03/2024)  Utilities: Not At Risk (07/03/2024)  Depression (PHQ2-9): Low Risk (01/24/2024)  Financial Resource Strain: Low Risk (09/29/2022)   Physical Activity: Inactive (10/11/2023)  Social Connections: Unknown (10/11/2023)  Stress: Stress Concern Present (10/11/2023)  Tobacco Use: Low Risk (07/03/2024)  Health Literacy: Adequate Health Literacy (10/11/2023)   SDOH Interventions:     Readmission Risk Interventions    05/31/2024    9:25 AM 03/12/2024    4:10 PM  Readmission Risk Prevention Plan  Transportation Screening Complete Complete  PCP or Specialist Appt within 5-7 Days Complete Complete  Home Care Screening Complete Complete  Medication Review (RN CM) Complete Complete

## 2024-07-03 NOTE — Progress Notes (Signed)
 [facility] Progress Note     Subjective: CC:  Resting comfortably on vent, vent dependent at home.   Objective: Vital signs in last 24 hours: Temp:  [97.3 F (36.3 C)-99.7 F (37.6 C)] 99.1 F (37.3 C) (12/30 0752) Pulse Rate:  [80-197] 101 (12/30 0815) Resp:  [16-20] 16 (12/30 0815) BP: (102-153)/(80-124) 102/80 (12/30 0800) SpO2:  [97 %-100 %] 100 % (12/30 0825) FiO2 (%):  [28 %] 28 % (12/30 0006) Weight:  [74.8 kg] 74.8 kg (12/30 0825)    Intake/Output from previous day: 12/29 0701 - 12/30 0700 In: 2507.7 [I.V.:123.1; IV Piggyback:2384.7] Out: -  Intake/Output this shift: Total I/O In: 116.2 [I.V.:4.5; IV Piggyback:111.7] Out: -   PE: Gen:  Alert, NAD Card:  HR low 100's Pulm:  ventilated respirations Abd: Soft, protuberant, non-tender Skin: warm and dry, no rashes  Psych: A&Ox3   Lab Results:  Recent Labs    07/02/24 1804 07/02/24 1822 07/02/24 2026 07/03/24 0321  WBC 10.7*  --   --  12.1*  HGB 14.1   < > 14.6 12.7*  HCT 47.1   < > 43.0 41.9  PLT 163  --   --  182   < > = values in this interval not displayed.   BMET Recent Labs    07/02/24 1804 07/02/24 1822 07/02/24 2026 07/03/24 0321  NA 155* 159* 159* 156*  K 4.3 4.1 3.7 3.2*  CL 116* 123*  --  122*  CO2 13*  --   --  18*  GLUCOSE 400* 414*  --  188*  BUN 19 21*  --  15  CREATININE 0.65 <0.20*  --  0.50*  CALCIUM  10.8*  --   --  10.0   PT/INR Recent Labs    07/02/24 1804  LABPROT 13.6  INR 1.0   CMP     Component Value Date/Time   NA 156 (H) 07/03/2024 0321   NA 139 05/13/2023 0000   K 3.2 (L) 07/03/2024 0321   CL 122 (H) 07/03/2024 0321   CO2 18 (L) 07/03/2024 0321   GLUCOSE 188 (H) 07/03/2024 0321   BUN 15 07/03/2024 0321   BUN 18 05/13/2023 0000   CREATININE 0.50 (L) 07/03/2024 0321   CREATININE 0.82 10/03/2018 1037   CALCIUM  10.0 07/03/2024 0321   CALCIUM  9.0 08/19/2023 0000   PROT 8.6 (H) 07/03/2024 0321   ALBUMIN 4.1 07/03/2024 0321   AST 54 (H)  07/03/2024 0321   ALT 150 (H) 07/03/2024 0321   ALKPHOS 421 (H) 07/03/2024 0321   BILITOT 1.2 07/03/2024 0321   GFRNONAA >60 07/03/2024 0321   GFRNONAA 106 10/03/2018 1037   GFRAA DUP 02/26/2024 0703   GFRAA 160 11/11/2023 0000   GFRAA 123 10/03/2018 1037   Lipase     Component Value Date/Time   LIPASE 20 02/26/2024 0424       Studies/Results: CT ABDOMEN PELVIS WO CONTRAST Result Date: 07/02/2024 EXAM: CT ABDOMEN AND PELVIS WITHOUT CONTRAST 07/02/2024 07:26:20 PM TECHNIQUE: CT of the abdomen and pelvis was performed without the administration of intravenous contrast. Multiplanar reformatted images are provided for review. Automated exposure control, iterative reconstruction, and/or weight-based adjustment of the mA/kV was utilized to reduce the radiation dose to as low as reasonably achievable. COMPARISON: Prior examination of 02/26/2024. CLINICAL HISTORY: Bowel obstruction suspected. FINDINGS: LOWER CHEST: Peribronchovascular nodule or infiltrate within the visualized right lung base is new from prior examination and likely relates to acute infection. Trace left pleural effusion with associated left basilar  atelectasis. LIVER: The liver is unremarkable. GALLBLADDER AND BILE DUCTS: A small amount of layering calcific debris is noted within the gallbladder fundus. The gallbladder now demonstrates marked diffuse wall thickening and extensive pericholecystic inflammatory stranding in keeping with changes of acute calculus cholecystitis. No biliary ductal dilatation. SPLEEN: No acute abnormality. PANCREAS: No acute abnormality. ADRENAL GLANDS: No acute abnormality. KIDNEYS, URETERS AND BLADDER: Numerous nonobstructing calculi are seen within the kidneys bilaterally measuring up to 8 mm. No ureteral calculi. No hydronephrosis. No perinephric inflammatory stranding or fluid collections were identified. The bladder is unremarkable. GI AND BOWEL: Retention gastrojejunostomies are in place with a  catheter coiled within the gastric lumen. Appendix is normal. The stomach, small bowel, and large bowel are otherwise unremarkable. There is no bowel obstruction. PERITONEUM AND RETROPERITONEUM: No ascites. No free air. VASCULATURE: Aorta is normal in caliber. LYMPH NODES: No lymphadenopathy. REPRODUCTIVE ORGANS: No acute abnormality. BONES AND SOFT TISSUES: Osseous structures are age appropriate. No acute bone abnormality. No lytic or blastic bone lesion. Marked atrophy of the skeletal musculature of the retroperitoneum, paraspinal region, and lower extremities bilaterally. IMPRESSION: 1. No evidence of bowel obstruction. 2. Acute calculus cholecystitis. 3. New peribronchovascular nodule or infiltrate within the visualized right lung base, likely related to acute infection. 4. Numerous nonobstructing renal calculi bilaterally measuring up to 8 mm, without hydronephrosis. 5. Marked atrophy of the skeletal musculature of the retroperitoneum, paraspinal region, and lower extremities bilaterally in keeping with the patient's known als . Electronically signed by: Dorethia Molt MD 07/02/2024 09:17 PM EST RP Workstation: [id number]   DG Chest Port 1 View Result Date: 07/02/2024 EXAM: 1 VIEW(S) XRAY OF THE CHEST 07/02/2024 06:35:00 PM COMPARISON: 05/28/2024 CLINICAL HISTORY: Questionable sepsis - evaluate for abnormality FINDINGS: LINES, TUBES AND DEVICES: Tracheostomy tube in place over tracheal air column. Right IJ dual lumen central venous catheter with tip at right atrium. Percutaneous gastrojejunostomy tube in upper abdomen, likely coiled within the gastric lumen. LUNGS AND PLEURA: Low lung volumes. Mildly elevated left hemidiaphragm. No focal pulmonary opacity. No pleural effusion. No pneumothorax. HEART AND MEDIASTINUM: No acute abnormality of the cardiac and mediastinal silhouettes. BONES AND SOFT TISSUES: No acute osseous abnormality. IMPRESSION: 1. No acute cardiopulmonary abnormality. 2. Tracheostomy tube  and right IJ dual lumen central venous catheter in place. 3. Percutaneous gastrojejunostomy tube likely coiled within the gastric lumen. Electronically signed by: Dorethia Molt MD 07/02/2024 08:47 PM EST RP Workstation: [id number]    Anti-infectives: Anti-infectives (From admission, onward)    Start     Dose/Rate Route Frequency Ordered Stop   07/03/24 0600  piperacillin -tazobactam (ZOSYN ) IVPB 3.375 g       Placed in Followed by Linked Group   3.375 g 12.5 mL/hr over 240 Minutes Intravenous Every 8 hours 07/02/24 2155     07/02/24 2200  piperacillin -tazobactam (ZOSYN ) IVPB 3.375 g       Placed in Followed by Linked Group   3.375 g 100 mL/hr over 30 Minutes Intravenous  Once 07/02/24 2155 07/02/24 2243         Latest Ref Rng & Units 07/03/2024    3:21 AM 07/02/2024    6:04 PM 03/12/2024    2:25 AM  Hepatic Function  Total Protein 6.5 - 8.1 g/dL 8.6  9.8    Albumin 3.5 - 5.0 g/dL 4.1  4.5  2.8   AST 15 - 41 U/L 54  60    ALT 0 - 44 U/L 150  177    Alk Phosphatase 38 - 126  U/L 421  497    Total Bilirubin 0.0 - 1.2 mg/dL 1.2  1.3       Assessment/Plan  Jerry [name] is an 53 y.o. male with recent acute cholecystitis treated with percutaneous cholecystostomy tube which was removed in November.  He presents to the hospital today in DKA, without a history of diabetes.  He has continued purulent drainage from his cholecystostomy tube site.  - afebrile, hemodynamically stable  - AST/ALT/alk phos elevated as above, back in September AST/ALT were normal. Alk phos appears chronically elevated. - per bedside RN, IR was just at bedside and plans to replace perc chole tube today - no emergent surgical needs, CCS will follow    LOS: 0 days   I reviewed nursing notes, hospitalist notes, last 24 h vitals and pain scores, last 48 h intake and output, last 24 h labs and trends, and last 24 h imaging results.  This care required moderate level of medical decision making.   Almarie Pringle, PA-C [facility] Please see Amion for pager number during day hours 7:00am-4:30pm

## 2024-07-03 NOTE — Progress Notes (Signed)
" ° °  NAME:  Jerry [name], MRN:  [id number], DOB:  16-Feb-1971, LOS: 0 ADMISSION DATE:  07/02/2024, CONSULTATION DATE: 12/29 REFERRING MD: Dr. Lenor, CHIEF COMPLAINT: Ventilator management, history of acute cholecystitis  History of Present Illness:  Patient is a 53 year old male with pertinent PMH advanced ALS with trach/ventilator dependence, prediabetes presents to [facility] on 12/29 with acute cholecystitis.   Patient also recently had previous acute cholecystitis requiring PERC Choley tube to be placed which was removed in November.  On 12/29 patient presents to [facility] with dry mouth and fatigue.  On arrival patient afebrile tachycardia 110s.  Glucose 414, NA 155, CO2 13, AG 26, OSM 368.  pH on VBG 7.41.  Concern for DKA patient started on insulin  drip and given IV fluids.  WBC mildly elevated at 10 and LA WNL.  LFTs elevated.  CT ABD/pelvis showing concern for acute cholecystitis.  Cultures obtained and started on IV Zosyn .  Surgery consulted recommended IR consult.  TRH to admit and PCCM to consult for chronic vent management.  Pertinent  Medical History   Past Medical History:  Diagnosis Date   ALS (amyotrophic lateral sclerosis) (HCC)    COVID-19 virus infection 06/2020   Meningitis 2003   spinal   Morbid obesity (HCC)    Neuromuscular disorder (HCC)    Prediabetes 11/05/2016   A1C 5.7 on 11/05/16   Type 2 diabetes mellitus (HCC) 08/31/2023   Significant Hospital Events: Including procedures, antibiotic start and stop dates in addition to other pertinent events   12/29-admitted with cholecystitis, PCCM consult for vent management  Interim History / Subjective:  Uneventful overnight  Objective    Blood pressure 106/78, pulse (!) 105, temperature 99.1 F (37.3 C), temperature source Oral, resp. rate 16, height 5' 5 (1.651 m), weight 74.8 kg, SpO2 100%.    Vent Mode: AC FiO2 (%):  [28 %] 28 % Set Rate:  [16 bmp] 16 bmp Vt Set:  [500 mL] 500 mL PEEP:  [5 cmH20] 5 cmH20 Plateau  Pressure:  [17 cmH20-21 cmH20] 17 cmH20   Intake/Output Summary (Last 24 hours) at 07/03/2024 1059 Last data filed at 07/03/2024 1000 Gross per 24 hour  Intake 2939.47 ml  Output --  Net 2939.47 ml   Filed Weights   07/03/24 0825  Weight: 74.8 kg    Examination: General: Chronically ill-appearing, HENT: Moist oral mucosa, trach in place, Lungs: Clear breath sounds bilaterally, decreased air movement at bases Cardiovascular: S1-S2 appreciated Abdomen: Soft, bowel sounds appreciated Extremities: No clubbing Neuro: Arousable, communicates using a tablet GU:   I reviewed last 24 h vitals and pain scores, last 48 h intake and output, last 24 h labs and trends, and last 24 h imaging results.  Resolved problem list   Assessment and Plan   Chronic respiratory failure with tracheostomy and ventilator dependence Advanced ALS - Will continue on vent settings - VAP bundle in place Trach care per protocol  As needed bronchodilators  Hold home scopolamine  and glycopyrrolate   Possible DKA - Can be transitioned off insulin   Cholecystitis - He had previously had a percutaneous cholecystostomy tube which was removed - Plan is to repeat place with the purulent discharge from his prior cholecystostomy tube site  Jennet Epley, MD [facility] Pager: See Amion     "

## 2024-07-03 NOTE — Hospital Course (Signed)
 Jerry [name] is a 53 year old male with PMH advanced ALS now with trach/ventilator dependence, PEG tube, previously prediabetes who presented with dry mouth and fatigue.  He was also found to be tachycardic with elevated glucose levels.  Workup was notable for DKA and he was started on insulin  drip and fluids.  He has a history of acute cholecystitis, and underwent percutaneous cholecystostomy tube via interventional radiology on 05/29/2024. The patient has not indicated any new abdominal discomfort recently, which represents a change from the abdominal pain he was experiencing at time of diagnosis of acute cholecystitis in November 2025.  Appears that cholecystostomy tube has has been removed since 05/29/2024.  General surgery consulted on admission for cholecystitis assistance and pulmonology for vent management.

## 2024-07-03 NOTE — Assessment & Plan Note (Signed)
-   Vent dependent in setting of ALS - Pulmonology managing vent

## 2024-07-03 NOTE — Progress Notes (Signed)
 PHARMACY - PHYSICIAN COMMUNICATION CRITICAL VALUE ALERT - BLOOD CULTURE IDENTIFICATION (BCID)  Jerry [name] is an 53 y.o. male who presented to [facility] on 07/02/2024 with a chief complaint of DKA.  Assessment:  1 of 4 bottles MRSE  Name of physician (or Provider) Contacted: Dr. Patsy  Current antibiotics: Zosyn   Changes to prescribed antibiotics recommended:  Recommendations accepted by provider - no additional antibiotics at this time  Results for orders placed or performed during the hospital encounter of 07/02/24  Blood Culture ID Panel (Reflexed) (Collected: 07/02/2024  6:04 PM)  Result Value Ref Range   Enterococcus faecalis NOT DETECTED NOT DETECTED   Enterococcus Faecium NOT DETECTED NOT DETECTED   Listeria monocytogenes NOT DETECTED NOT DETECTED   Staphylococcus species DETECTED (A) NOT DETECTED   Staphylococcus aureus (BCID) NOT DETECTED NOT DETECTED   Staphylococcus epidermidis DETECTED (A) NOT DETECTED   Staphylococcus lugdunensis NOT DETECTED NOT DETECTED   Streptococcus species NOT DETECTED NOT DETECTED   Streptococcus agalactiae NOT DETECTED NOT DETECTED   Streptococcus pneumoniae NOT DETECTED NOT DETECTED   Streptococcus pyogenes NOT DETECTED NOT DETECTED   A.calcoaceticus-baumannii NOT DETECTED NOT DETECTED   Bacteroides fragilis NOT DETECTED NOT DETECTED   Enterobacterales NOT DETECTED NOT DETECTED   Enterobacter cloacae complex NOT DETECTED NOT DETECTED   Escherichia coli NOT DETECTED NOT DETECTED   Klebsiella aerogenes NOT DETECTED NOT DETECTED   Klebsiella oxytoca NOT DETECTED NOT DETECTED   Klebsiella pneumoniae NOT DETECTED NOT DETECTED   Proteus species NOT DETECTED NOT DETECTED   Salmonella species NOT DETECTED NOT DETECTED   Serratia marcescens NOT DETECTED NOT DETECTED   Haemophilus influenzae NOT DETECTED NOT DETECTED   Neisseria meningitidis NOT DETECTED NOT DETECTED   Pseudomonas aeruginosa NOT DETECTED NOT DETECTED   Stenotrophomonas  maltophilia NOT DETECTED NOT DETECTED   Candida albicans NOT DETECTED NOT DETECTED   Candida auris NOT DETECTED NOT DETECTED   Candida glabrata NOT DETECTED NOT DETECTED   Candida krusei NOT DETECTED NOT DETECTED   Candida parapsilosis NOT DETECTED NOT DETECTED   Candida tropicalis NOT DETECTED NOT DETECTED   Cryptococcus neoformans/gattii NOT DETECTED NOT DETECTED   Methicillin resistance mecA/C DETECTED (A) NOT DETECTED    Rocky Slade, PharmD, BCPS 07/03/2024  3:17 PM

## 2024-07-03 NOTE — Progress Notes (Signed)
 " Progress Note    Jerry [name]   FMW:[id number]  DOB: 1971/02/26  DOA: 07/02/2024     0 PCP: [facility]  Initial CC: fatigue  Hospital Course: Jerry [name] is a 53 year old male with PMH advanced ALS now with trach/ventilator dependence, PEG tube, previously prediabetes who presented with dry mouth and fatigue.  He was also found to be tachycardic with elevated glucose levels.  Workup was notable for DKA and he was started on insulin  drip and fluids.  He has a history of acute cholecystitis, and underwent percutaneous cholecystostomy tube via interventional radiology on 05/29/2024. The patient has not indicated any new abdominal discomfort recently, which represents a change from the abdominal pain he was experiencing at time of diagnosis of acute cholecystitis in November 2025.  Appears that cholecystostomy tube has has been removed since 05/29/2024.  General surgery consulted on admission for cholecystitis assistance and pulmonology for vent management.  Interval History:  Seen this morning in the ICU with wife present bedside.  He has been transitioned off insulin  drip at this time and we will continue watching glucose levels. Seems to be comfortable and denying much complaints. Wife is hesitant about replacing cholecystostomy tube.  Assessment and Plan: * DKA (diabetic ketoacidosis) (HCC)-resolved as of 07/03/2024 - Appears to have progressed to diabetes from prediabetes; some contribution likely from underlying infection as well -Treated with DKA protocol and responded well - Now transitioned to subcutaneous insulins  Cholecystitis - Initially underwent percutaneous cholecystostomy tube placement on 02/26/2024 when initially diagnosed with acute cholecystitis. -Recent tube exchange on 05/29/2024 and at some point after that has become removed - Underwent consult with general surgery again on admission with recommendation for repeat placement of tube given CT showing  acute calculous cholecystitis -Wife wishing to speak with surgery more prior to agreeing to repeat tube placement as she wants cholecystectomy -Continue Zosyn  for now  DMII (diabetes mellitus, type 2) (HCC) - Prior A1c's around 5.7 to 6%.  Now has A1c 8.8%.  Discussed with wife that may need insulin  at discharge - RD also consulted for assistance with change in tube feed as well once resumed - Continue Lantus  and SSI.  Will modify as needed  Acute hypernatremia - In setting of dehydration and DKA -Continue trending BMP  Hypercalcemia - Due to dehydration and volume depletion - Improved with fluids  Chronic respiratory failure (HCC) - Vent dependent in setting of ALS - Pulmonology managing vent  ALS (amyotrophic lateral sclerosis) (HCC) - Dependent on ventilator and PEG tube for nutrition -Cared for by wife at home  Dehydration-resolved as of 07/03/2024 - S/p IV fluids with improvement in BUN    Antimicrobials: Zosyn  07/02/2024 >> current  DVT prophylaxis:   Lovenox    Code Status:   Code Status: Full Code  Mobility Assessment (Last 72 Hours)     Mobility Assessment     Row Name 07/03/24 0741 07/03/24 0200         Does the patient have exclusion criteria? Yes- Bedfast (Level 1) - Select exclusion criteria in next row No- Perform mobility assessment      Mobility Assessment Exclusion Criteria Order to exclude patient from protocol --      What is the highest level of mobility based on the mobility assessment? -- Level 1 (Bedfast) - Unable to balance while sitting on edge of bed         Diet: Diet Orders (From admission, onward)     Start     Ordered  07/02/24 2317  Diet NPO time specified  Diet effective now        07/02/24 2317            Barriers to discharge: none Disposition Plan:  Home  HH orders placed: TBD Status is: Inpt  Objective: Blood pressure 121/82, pulse (!) 109, temperature 98.6 F (37 C), temperature source Oral, resp. rate 16,  height 5' 5 (1.651 m), weight 74.8 kg, SpO2 100%.  Examination:  Physical Exam Constitutional:      Comments: Chronically ill-appearing adult man resting in bed in no distress  HENT:     Head: Normocephalic and atraumatic.     Mouth/Throat:     Mouth: Mucous membranes are dry.  Eyes:     Extraocular Movements: Extraocular movements intact.  Cardiovascular:     Rate and Rhythm: Regular rhythm. Tachycardia present.  Pulmonary:     Effort: Pulmonary effort is normal. No respiratory distress.     Breath sounds: Normal breath sounds.  Abdominal:     General: Bowel sounds are normal. There is distension.     Palpations: Abdomen is soft.     Tenderness: There is abdominal tenderness (mild nonspecific).  Musculoskeletal:        General: Normal range of motion.     Cervical back: Normal range of motion and neck supple.  Skin:    General: Skin is warm and dry.  Neurological:     Mental Status: Mental status is at baseline.     Comments: Uses tablet device to communicate; LE paralysis noted      Consultants:  Pulmonology General surgery IR  Procedures:    Data Reviewed: Results for orders placed or performed during the hospital encounter of 07/02/24 (from the past 24 hours)  Comprehensive metabolic panel     Status: Abnormal   Collection Time: 07/02/24  6:04 PM  Result Value Ref Range   Sodium 155 (H) 135 - 145 mmol/L   Potassium 4.3 3.5 - 5.1 mmol/L   Chloride 116 (H) 98 - 111 mmol/L   CO2 13 (L) 22 - 32 mmol/L   Glucose, Bld 400 (H) 70 - 99 mg/dL   BUN 19 6 - 20 mg/dL   Creatinine, Ser 9.34 0.61 - 1.24 mg/dL   Calcium  10.8 (H) 8.9 - 10.3 mg/dL   Total Protein 9.8 (H) 6.5 - 8.1 g/dL   Albumin 4.5 3.5 - 5.0 g/dL   AST 60 (H) 15 - 41 U/L   ALT 177 (H) 0 - 44 U/L   Alkaline Phosphatase 497 (H) 38 - 126 U/L   Total Bilirubin 1.3 (H) 0.0 - 1.2 mg/dL   GFR, Estimated >39 >39 mL/min   Anion gap 26 (H) 5 - 15  CBC with Differential     Status: Abnormal   Collection Time:  07/02/24  6:04 PM  Result Value Ref Range   WBC 10.7 (H) 4.0 - 10.5 K/uL   RBC 5.61 4.22 - 5.81 MIL/uL   Hemoglobin 14.1 13.0 - 17.0 g/dL   HCT 52.8 60.9 - 47.9 %   MCV 84.0 80.0 - 100.0 fL   MCH 25.1 (L) 26.0 - 34.0 pg   MCHC 29.9 (L) 30.0 - 36.0 g/dL   RDW 79.2 (H) 88.4 - 84.4 %   Platelets 163 150 - 400 K/uL   nRBC 0.0 0.0 - 0.2 %   Neutrophils Relative % 70 %   Neutro Abs 7.6 1.7 - 7.7 K/uL   Lymphocytes Relative 23 %  Lymphs Abs 2.4 0.7 - 4.0 K/uL   Monocytes Relative 5 %   Monocytes Absolute 0.5 0.1 - 1.0 K/uL   Eosinophils Relative 1 %   Eosinophils Absolute 0.1 0.0 - 0.5 K/uL   Basophils Relative 1 %   Basophils Absolute 0.1 0.0 - 0.1 K/uL   Immature Granulocytes 0 %   Abs Immature Granulocytes 0.04 0.00 - 0.07 K/uL  Protime-INR     Status: None   Collection Time: 07/02/24  6:04 PM  Result Value Ref Range   Prothrombin Time 13.6 11.4 - 15.2 seconds   INR 1.0 0.8 - 1.2  Blood Culture (routine x 2)     Status: None (Preliminary result)   Collection Time: 07/02/24  6:04 PM   Specimen: BLOOD  Result Value Ref Range   Specimen Description BLOOD SITE NOT SPECIFIED    Special Requests      BOTTLES DRAWN AEROBIC AND ANAEROBIC Blood Culture results may not be optimal due to an inadequate volume of blood received in culture bottles   Culture  Setup Time      GRAM POSITIVE COCCI IN CLUSTERS ANAEROBIC BOTTLE ONLY Organism ID to follow Performed at [facility], [address] [place], [place] [address]    Culture GRAM POSITIVE COCCI    Report Status PENDING   Urinalysis, w/ Reflex to Culture (Infection Suspected) -Urine, Catheterized     Status: Abnormal   Collection Time: 07/02/24  6:16 PM  Result Value Ref Range   Specimen Source URINE, CATHETERIZED    Color, Urine YELLOW YELLOW   APPearance CLEAR CLEAR   Specific Gravity, Urine 1.033 (H) 1.005 - 1.030   pH 5.0 5.0 - 8.0   Glucose, UA >=500 (A) NEGATIVE mg/dL   Hgb urine dipstick NEGATIVE NEGATIVE    Bilirubin Urine NEGATIVE NEGATIVE   Ketones, ur 80 (A) NEGATIVE mg/dL   Protein, ur >=699 (A) NEGATIVE mg/dL   Nitrite NEGATIVE NEGATIVE   Leukocytes,Ua NEGATIVE NEGATIVE   RBC / HPF 0-5 0 - 5 RBC/hpf   WBC, UA 0-5 0 - 5 WBC/hpf   Bacteria, UA NONE SEEN NONE SEEN   Squamous Epithelial / HPF 0-5 0 - 5 /HPF   Mucus PRESENT   Resp panel by RT-PCR (RSV, Flu A&B, Covid) Anterior Nasal Swab     Status: None   Collection Time: 07/02/24  6:19 PM   Specimen: Anterior Nasal Swab  Result Value Ref Range   SARS Coronavirus 2 by RT PCR NEGATIVE NEGATIVE   Influenza A by PCR NEGATIVE NEGATIVE   Influenza B by PCR NEGATIVE NEGATIVE   Resp Syncytial Virus by PCR NEGATIVE NEGATIVE  I-Stat Lactic Acid, ED     Status: None   Collection Time: 07/02/24  6:21 PM  Result Value Ref Range   Lactic Acid, Venous 1.3 0.5 - 1.9 mmol/L  I-stat chem 8, ED     Status: Abnormal   Collection Time: 07/02/24  6:22 PM  Result Value Ref Range   Sodium 159 (H) 135 - 145 mmol/L   Potassium 4.1 3.5 - 5.1 mmol/L   Chloride 123 (H) 98 - 111 mmol/L   BUN 21 (H) 6 - 20 mg/dL   Creatinine, Ser <9.79 (L) 0.61 - 1.24 mg/dL   Glucose, Bld 585 (H) 70 - 99 mg/dL   Calcium , Ion 1.29 1.15 - 1.40 mmol/L   TCO2 16 (L) 22 - 32 mmol/L   Hemoglobin 15.3 13.0 - 17.0 g/dL   HCT 54.9 60.9 - 47.9 %  Osmolality     Status: Abnormal   Collection Time: 07/02/24  7:31 PM  Result Value Ref Range   Osmolality 368 (HH) 275 - 295 mOsm/kg  Blood Culture (routine x 2)     Status: None (Preliminary result)   Collection Time: 07/02/24  8:00 PM   Specimen: BLOOD  Result Value Ref Range   Specimen Description BLOOD RIGHT ANTECUBITAL    Special Requests      BOTTLES DRAWN AEROBIC AND ANAEROBIC Blood Culture results may not be optimal due to an inadequate volume of blood received in culture bottles   Culture      NO GROWTH < 12 HOURS Performed at [facility], [address], [place], [place] [address]    Report Status PENDING    Beta-hydroxybutyric acid     Status: Abnormal   Collection Time: 07/02/24  8:00 PM  Result Value Ref Range   Beta-Hydroxybutyric Acid 6.20 (H) 0.05 - 0.27 mmol/L  I-Stat venous blood gas, ([facility] ED, [facility], [facility])     Status: Abnormal   Collection Time: 07/02/24  8:26 PM  Result Value Ref Range   pH, Ven 7.411 7.25 - 7.43   pCO2, Ven 22.5 (L) 44 - 60 mmHg   pO2, Ven 143 (H) 32 - 45 mmHg   Bicarbonate 14.3 (L) 20.0 - 28.0 mmol/L   TCO2 15 (L) 22 - 32 mmol/L   O2 Saturation 99 %   Acid-base deficit 8.0 (H) 0.0 - 2.0 mmol/L   Sodium 159 (H) 135 - 145 mmol/L   Potassium 3.7 3.5 - 5.1 mmol/L   Calcium , Ion 1.24 1.15 - 1.40 mmol/L   HCT 43.0 39.0 - 52.0 %   Hemoglobin 14.6 13.0 - 17.0 g/dL   Sample type VENOUS   I-Stat Lactic Acid, ED     Status: None   Collection Time: 07/02/24  8:27 PM  Result Value Ref Range   Lactic Acid, Venous 1.1 0.5 - 1.9 mmol/L  CBG monitoring, ED     Status: Abnormal   Collection Time: 07/02/24 10:09 PM  Result Value Ref Range   Glucose-Capillary 281 (H) 70 - 99 mg/dL  CBG monitoring, ED     Status: Abnormal   Collection Time: 07/02/24 10:58 PM  Result Value Ref Range   Glucose-Capillary 296 (H) 70 - 99 mg/dL  Troponin T, High Sensitivity     Status: Abnormal   Collection Time: 07/02/24 11:09 PM  Result Value Ref Range   Troponin T High Sensitivity 77 (H) 0 - 19 ng/L  Magnesium      Status: None   Collection Time: 07/02/24 11:57 PM  Result Value Ref Range   Magnesium  2.1 1.7 - 2.4 mg/dL  CBG monitoring, ED     Status: Abnormal   Collection Time: 07/03/24 12:19 AM  Result Value Ref Range   Glucose-Capillary 200 (H) 70 - 99 mg/dL  Glucose, capillary     Status: Abnormal   Collection Time: 07/03/24 12:55 AM  Result Value Ref Range   Glucose-Capillary 184 (H) 70 - 99 mg/dL  Glucose, capillary     Status: Abnormal   Collection Time: 07/03/24  2:47 AM  Result Value Ref Range   Glucose-Capillary 148 (H) 70 - 99 mg/dL  CBC with Differential/Platelet      Status: Abnormal   Collection Time: 07/03/24  3:21 AM  Result Value Ref Range   WBC 12.1 (H) 4.0 - 10.5 K/uL   RBC 5.08 4.22 - 5.81 MIL/uL   Hemoglobin 12.7 (  L) 13.0 - 17.0 g/dL   HCT 58.0 60.9 - 47.9 %   MCV 82.5 80.0 - 100.0 fL   MCH 25.0 (L) 26.0 - 34.0 pg   MCHC 30.3 30.0 - 36.0 g/dL   RDW 79.4 (H) 88.4 - 84.4 %   Platelets 182 150 - 400 K/uL   nRBC 0.0 0.0 - 0.2 %   Neutrophils Relative % 72 %   Neutro Abs 8.8 (H) 1.7 - 7.7 K/uL   Lymphocytes Relative 19 %   Lymphs Abs 2.3 0.7 - 4.0 K/uL   Monocytes Relative 6 %   Monocytes Absolute 0.7 0.1 - 1.0 K/uL   Eosinophils Relative 2 %   Eosinophils Absolute 0.2 0.0 - 0.5 K/uL   Basophils Relative 1 %   Basophils Absolute 0.1 0.0 - 0.1 K/uL   Immature Granulocytes 0 %   Abs Immature Granulocytes 0.04 0.00 - 0.07 K/uL  Comprehensive metabolic panel with GFR     Status: Abnormal   Collection Time: 07/03/24  3:21 AM  Result Value Ref Range   Sodium 156 (H) 135 - 145 mmol/L   Potassium 3.2 (L) 3.5 - 5.1 mmol/L   Chloride 122 (H) 98 - 111 mmol/L   CO2 18 (L) 22 - 32 mmol/L   Glucose, Bld 188 (H) 70 - 99 mg/dL   BUN 15 6 - 20 mg/dL   Creatinine, Ser 9.49 (L) 0.61 - 1.24 mg/dL   Calcium  10.0 8.9 - 10.3 mg/dL   Total Protein 8.6 (H) 6.5 - 8.1 g/dL   Albumin 4.1 3.5 - 5.0 g/dL   AST 54 (H) 15 - 41 U/L   ALT 150 (H) 0 - 44 U/L   Alkaline Phosphatase 421 (H) 38 - 126 U/L   Total Bilirubin 1.2 0.0 - 1.2 mg/dL   GFR, Estimated >39 >39 mL/min   Anion gap 16 (H) 5 - 15  Beta-hydroxybutyric acid     Status: Abnormal   Collection Time: 07/03/24  3:21 AM  Result Value Ref Range   Beta-Hydroxybutyric Acid 2.16 (H) 0.05 - 0.27 mmol/L  Hemoglobin A1c     Status: Abnormal   Collection Time: 07/03/24  3:21 AM  Result Value Ref Range   Hgb A1c MFr Bld 8.8 (H) 4.8 - 5.6 %   Mean Plasma Glucose 205.86 mg/dL  Glucose, capillary     Status: Abnormal   Collection Time: 07/03/24  5:34 AM  Result Value Ref Range   Glucose-Capillary 156 (H) 70 -  99 mg/dL  Glucose, capillary     Status: Abnormal   Collection Time: 07/03/24  6:30 AM  Result Value Ref Range   Glucose-Capillary 173 (H) 70 - 99 mg/dL  Glucose, capillary     Status: Abnormal   Collection Time: 07/03/24  7:53 AM  Result Value Ref Range   Glucose-Capillary 157 (H) 70 - 99 mg/dL  Glucose, capillary     Status: Abnormal   Collection Time: 07/03/24  8:56 AM  Result Value Ref Range   Glucose-Capillary 177 (H) 70 - 99 mg/dL  Creatinine, urine, random     Status: None   Collection Time: 07/03/24 10:02 AM  Result Value Ref Range   Creatinine, Urine 17 mg/dL  Sodium, urine, random     Status: None   Collection Time: 07/03/24 10:02 AM  Result Value Ref Range   Sodium, Ur 31 mmol/L  Osmolality, urine     Status: None   Collection Time: 07/03/24 10:02 AM  Result Value Ref Range  Osmolality, Ur 816 300 - 900 mOsm/kg  Glucose, capillary     Status: Abnormal   Collection Time: 07/03/24 10:04 AM  Result Value Ref Range   Glucose-Capillary 164 (H) 70 - 99 mg/dL  Basic metabolic panel with GFR     Status: Abnormal   Collection Time: 07/03/24 10:20 AM  Result Value Ref Range   Sodium 153 (H) 135 - 145 mmol/L   Potassium 3.9 3.5 - 5.1 mmol/L   Chloride 122 (H) 98 - 111 mmol/L   CO2 19 (L) 22 - 32 mmol/L   Glucose, Bld 190 (H) 70 - 99 mg/dL   BUN 14 6 - 20 mg/dL   Creatinine, Ser 9.50 (L) 0.61 - 1.24 mg/dL   Calcium  9.9 8.9 - 10.3 mg/dL   GFR, Estimated >39 >39 mL/min   Anion gap 12 5 - 15  Beta-hydroxybutyric acid     Status: Abnormal   Collection Time: 07/03/24 10:20 AM  Result Value Ref Range   Beta-Hydroxybutyric Acid 0.63 (H) 0.05 - 0.27 mmol/L  Phosphorus     Status: Abnormal   Collection Time: 07/03/24 10:20 AM  Result Value Ref Range   Phosphorus 1.6 (L) 2.5 - 4.6 mg/dL  Glucose, capillary     Status: Abnormal   Collection Time: 07/03/24 11:36 AM  Result Value Ref Range   Glucose-Capillary 179 (H) 70 - 99 mg/dL    I have reviewed pertinent nursing notes,  vitals, labs, and images as necessary. I have ordered labwork to follow up on as indicated.  I have reviewed the last notes from staff over past 24 hours. I have discussed patient's care plan and test results with nursing staff, CM/SW, and other staff as appropriate.  Old records reviewed in assessment of this patient  Time spent: Greater than 50% of the 55 minute visit was spent in counseling/coordination of care for the patient as laid out in the A&P.   LOS: 0 days   Alm Apo, MD [facility] 07/03/2024, 1:54 PM "

## 2024-07-03 NOTE — Assessment & Plan Note (Signed)
-   Appears to have progressed to diabetes from prediabetes; some contribution likely from underlying infection as well -Treated with DKA protocol and responded well - Now transitioned to subcutaneous insulins

## 2024-07-03 NOTE — Assessment & Plan Note (Addendum)
-   Prior A1c's around 5.7 to 6%.  Now has A1c 8.8%.  Discussed with wife that may need insulin  at discharge - RD also consulted for assistance; tube feeds changed to Glucerna - Continue Lantus  and SSI.  Will modify as needed - Some increase in CBGs after Glucerna uptitrated.  Insulin  regimen adjusted - Tentative plan for freestyle libre 3 at discharge as well.  Prior Auth completed and approved, $0 - continued on Lantus  and Novolog  SSI at discharge

## 2024-07-03 NOTE — Progress Notes (Signed)
 Jerry [name] [id number] [facility] Liaison Note:   This is a current home-based patient with [facility]. We can provide next business day visits, including labs, diagnostics, medications, disease management, and ongoing monitoring to prevent an unnecessary hospital admission.   Please call with any questions or concerns. Liaison will continue to follow for discharge disposition.   Thank you, Eleanor Nail, LPN [facility] Liaison [phone]

## 2024-07-03 NOTE — Progress Notes (Signed)
 eLink Physician-Brief Progress Note Patient Name: Jerry [name] DOB: 29-Nov-1970 MRN: [id number]   Date of Service  07/03/2024  HPI/Events of Note  SVT @ 180-200 Pt symptomatic BP ok  eICU Interventions  SPontaneously converted to nSR, before lopressor could be given freq PVCs - last K/Mg ok- will repeat     Intervention Category Intermediate Interventions: Arrhythmia - evaluation and management  Crystalina Stodghill V. Janesia Joswick 07/03/2024, 3:14 AM

## 2024-07-04 DIAGNOSIS — E1110 Type 2 diabetes mellitus with ketoacidosis without coma: Secondary | ICD-10-CM | POA: Diagnosis not present

## 2024-07-04 DIAGNOSIS — K819 Cholecystitis, unspecified: Secondary | ICD-10-CM | POA: Diagnosis not present

## 2024-07-04 LAB — CBC WITH DIFFERENTIAL/PLATELET
Abs Immature Granulocytes: 0.02 K/uL (ref 0.00–0.07)
Basophils Absolute: 0 K/uL (ref 0.0–0.1)
Basophils Relative: 0 %
Eosinophils Absolute: 0.1 K/uL (ref 0.0–0.5)
Eosinophils Relative: 2 %
HCT: 40 % (ref 39.0–52.0)
Hemoglobin: 12.1 g/dL — ABNORMAL LOW (ref 13.0–17.0)
Immature Granulocytes: 0 %
Lymphocytes Relative: 20 %
Lymphs Abs: 1.8 K/uL (ref 0.7–4.0)
MCH: 24.8 pg — ABNORMAL LOW (ref 26.0–34.0)
MCHC: 30.3 g/dL (ref 30.0–36.0)
MCV: 82.1 fL (ref 80.0–100.0)
Monocytes Absolute: 0.5 K/uL (ref 0.1–1.0)
Monocytes Relative: 6 %
Neutro Abs: 6.4 K/uL (ref 1.7–7.7)
Neutrophils Relative %: 72 %
Platelets: 158 K/uL (ref 150–400)
RBC: 4.87 MIL/uL (ref 4.22–5.81)
RDW: 20.6 % — ABNORMAL HIGH (ref 11.5–15.5)
WBC: 8.9 K/uL (ref 4.0–10.5)
nRBC: 0 % (ref 0.0–0.2)

## 2024-07-04 LAB — BASIC METABOLIC PANEL WITH GFR
Anion gap: 13 (ref 5–15)
BUN: 14 mg/dL (ref 6–20)
CO2: 20 mmol/L — ABNORMAL LOW (ref 22–32)
Calcium: 9.9 mg/dL (ref 8.9–10.3)
Chloride: 122 mmol/L — ABNORMAL HIGH (ref 98–111)
Creatinine, Ser: 0.47 mg/dL — ABNORMAL LOW (ref 0.61–1.24)
GFR, Estimated: >60 mL/min
Glucose, Bld: 193 mg/dL — ABNORMAL HIGH (ref 70–99)
Potassium: 3 mmol/L — ABNORMAL LOW (ref 3.5–5.1)
Sodium: 156 mmol/L — ABNORMAL HIGH (ref 135–145)

## 2024-07-04 LAB — GLUCOSE, CAPILLARY
Glucose-Capillary: 156 mg/dL — ABNORMAL HIGH (ref 70–99)
Glucose-Capillary: 173 mg/dL — ABNORMAL HIGH (ref 70–99)
Glucose-Capillary: 160 mg/dL — ABNORMAL HIGH (ref 70–99)
Glucose-Capillary: 125 mg/dL — ABNORMAL HIGH (ref 70–99)
Glucose-Capillary: 122 mg/dL — ABNORMAL HIGH (ref 70–99)
Glucose-Capillary: 133 mg/dL — ABNORMAL HIGH (ref 70–99)

## 2024-07-04 LAB — MAGNESIUM: Magnesium: 2.1 mg/dL (ref 1.7–2.4)

## 2024-07-04 MED ORDER — GLUCERNA 1.5 CAL PO LIQD
1000.0000 mL | ORAL | Status: DC
  Administered 2024-07-04: 1000 mL
  Filled 2024-07-04: qty 1000

## 2024-07-04 MED ORDER — CARMEX CLASSIC LIP BALM EX OINT: TOPICAL_OINTMENT | CUTANEOUS | Status: DC | PRN

## 2024-07-04 MED ORDER — POTASSIUM CHLORIDE 10 MEQ/50ML IV SOLN
10.0000 meq | INTRAVENOUS | Status: AC
  Administered 2024-07-04 (×5): 10 meq via INTRAVENOUS
  Filled 2024-07-04 (×5): qty 50

## 2024-07-04 MED ORDER — LACTATED RINGERS IV SOLN: INTRAVENOUS | Status: DC

## 2024-07-04 NOTE — Plan of Care (Signed)
   Problem: Activity: Goal: Risk for activity intolerance will decrease Outcome: Progressing   Problem: Nutrition: Goal: Adequate nutrition will be maintained Outcome: Progressing   Problem: Coping: Goal: Level of anxiety will decrease Outcome: Progressing

## 2024-07-04 NOTE — Progress Notes (Signed)
 [facility] Progress Note     Subjective: CC:  Resting comfortably on vent, vent dependent at home.  Denies abd pain.   Objective: Vital signs in last 24 hours: Temp:  [98.4 F (36.9 C)-99 F (37.2 C)] 98.7 F (37.1 C) (12/31 1134) Pulse Rate:  [101-115] 109 (12/31 1000) Resp:  [16-17] 16 (12/31 1000) BP: (102-138)/(74-98) 118/80 (12/31 1000) SpO2:  [100 %] 100 % (12/31 1237) FiO2 (%):  [28 %] 28 % (12/31 1237) Last BM Date :  (pta)  Intake/Output from previous day: 12/30 0701 - 12/31 0700 In: 668.9 [I.V.:331; IV Piggyback:319.9] Out: 1305 [Urine:1280; Drains:25] Intake/Output this shift: Total I/O In: 10 [I.V.:10] Out: -   PE: Gen:  Alert, NAD Card:  HR low 100's Pulm:  ventilated respirations Abd: Soft, protuberant, non-tender Skin: warm and dry, no rashes  Psych: A&Ox3   Lab Results:  Recent Labs    07/03/24 0321 07/04/24 0402  WBC 12.1* 8.9  HGB 12.7* 12.1*  HCT 41.9 40.0  PLT 182 158   BMET Recent Labs    07/03/24 1020 07/04/24 0402  NA 153* 156*  K 3.9 3.0*  CL 122* 122*  CO2 19* 20*  GLUCOSE 190* 193*  BUN 14 14  CREATININE 0.49* 0.47*  CALCIUM  9.9 9.9   PT/INR Recent Labs    07/02/24 1804  LABPROT 13.6  INR 1.0   CMP     Component Value Date/Time   NA 156 (H) 07/04/2024 0402   NA 139 05/13/2023 0000   K 3.0 (L) 07/04/2024 0402   CL 122 (H) 07/04/2024 0402   CO2 20 (L) 07/04/2024 0402   GLUCOSE 193 (H) 07/04/2024 0402   BUN 14 07/04/2024 0402   BUN 18 05/13/2023 0000   CREATININE 0.47 (L) 07/04/2024 0402   CREATININE 0.82 10/03/2018 1037   CALCIUM  9.9 07/04/2024 0402   CALCIUM  9.0 08/19/2023 0000   PROT 8.6 (H) 07/03/2024 0321   ALBUMIN 4.1 07/03/2024 0321   AST 54 (H) 07/03/2024 0321   ALT 150 (H) 07/03/2024 0321   ALKPHOS 421 (H) 07/03/2024 0321   BILITOT 1.2 07/03/2024 0321   GFRNONAA >60 07/04/2024 0402   GFRNONAA 106 10/03/2018 1037   GFRAA DUP 02/26/2024 0703   GFRAA 160 11/11/2023 0000   GFRAA  123 10/03/2018 1037   Lipase     Component Value Date/Time   LIPASE 20 02/26/2024 0424       Studies/Results: IR Perc Cholecystostomy Result Date: 07/03/2024 INDICATION: Diabetic ketoacidosis and concern for recurrent cholecystitis. Previous cholecystostomy tube has been removed. Persistent drainage from cholecystostomy tube site. EXAM: REPLACEMENT OF CHOLECYSTOSTOMY TUBE WITH FLUOROSCOPY MEDICATIONS: Moderate sedation ANESTHESIA/SEDATION: Moderate (conscious) sedation was employed during this procedure. A total of Versed  1.5 mg and Fentanyl  100 mcg was administered intravenously by the radiology nurse. Total intra-service moderate Sedation Time: 15 minutes. The patient's level of consciousness and vital signs were monitored continuously by radiology nursing throughout the procedure under my direct supervision. FLUOROSCOPY: Radiation Exposure Index (as provided by the fluoroscopic device): 22 mGy Kerma CONTRAST:  20 mL Omnipaque  300 COMPLICATIONS: None immediate. PROCEDURE: Informed consent was obtained for cholecystostomy tube placement/replacement. Maximal Sterile Barrier Technique was utilized including caps, mask, sterile gowns, sterile gloves, sterile drape, hand hygiene and skin antiseptic. A timeout was performed prior to the initiation of the procedure. Patient was placed supine. The right abdomen was prepped and draped in sterile fashion. Kumpe catheter was advanced through the old cholecystostomy tube track using fluoroscopy. Contrast injection confirmed  placement in the irregular gallbladder. Multiple cholangiogram images were obtained. Superstiff Amplatz wire was placed. Ultrasound confirmed that the wire was positioned within the gallbladder. 10 French multipurpose drain was advanced over the wire and reconstituted in the gallbladder fundus region. Drain was flushed with saline and attached to a gravity bag. Skin was anesthetized with 1% lidocaine  and cholecystostomy tube was sutured to  skin. FINDINGS: Catheter was successfully advanced into the gallbladder through the old cholecystostomy tube tract. Again noted is a very abnormal appearing gallbladder with filling defects and diffuse mucosal irregularity. Contrast fills the duodenum without visualization of a normal cystic duct or common bile duct. IMPRESSION: 1. Successful replacement of the cholecystostomy tube through the old tract. 2. Re-demonstration of a markedly abnormal gallbladder with mucosal irregularity. 3. Abnormal communication between the gallbladder and the duodenum. A normal cystic duct and common bile duct are not visualized. Electronically Signed   By: Juliene Balder M.D.   On: 07/03/2024 20:48   CT ABDOMEN PELVIS WO CONTRAST Result Date: 07/02/2024 EXAM: CT ABDOMEN AND PELVIS WITHOUT CONTRAST 07/02/2024 07:26:20 PM TECHNIQUE: CT of the abdomen and pelvis was performed without the administration of intravenous contrast. Multiplanar reformatted images are provided for review. Automated exposure control, iterative reconstruction, and/or weight-based adjustment of the mA/kV was utilized to reduce the radiation dose to as low as reasonably achievable. COMPARISON: Prior examination of 02/26/2024. CLINICAL HISTORY: Bowel obstruction suspected. FINDINGS: LOWER CHEST: Peribronchovascular nodule or infiltrate within the visualized right lung base is new from prior examination and likely relates to acute infection. Trace left pleural effusion with associated left basilar atelectasis. LIVER: The liver is unremarkable. GALLBLADDER AND BILE DUCTS: A small amount of layering calcific debris is noted within the gallbladder fundus. The gallbladder now demonstrates marked diffuse wall thickening and extensive pericholecystic inflammatory stranding in keeping with changes of acute calculus cholecystitis. No biliary ductal dilatation. SPLEEN: No acute abnormality. PANCREAS: No acute abnormality. ADRENAL GLANDS: No acute abnormality. KIDNEYS,  URETERS AND BLADDER: Numerous nonobstructing calculi are seen within the kidneys bilaterally measuring up to 8 mm. No ureteral calculi. No hydronephrosis. No perinephric inflammatory stranding or fluid collections were identified. The bladder is unremarkable. GI AND BOWEL: Retention gastrojejunostomies are in place with a catheter coiled within the gastric lumen. Appendix is normal. The stomach, small bowel, and large bowel are otherwise unremarkable. There is no bowel obstruction. PERITONEUM AND RETROPERITONEUM: No ascites. No free air. VASCULATURE: Aorta is normal in caliber. LYMPH NODES: No lymphadenopathy. REPRODUCTIVE ORGANS: No acute abnormality. BONES AND SOFT TISSUES: Osseous structures are age appropriate. No acute bone abnormality. No lytic or blastic bone lesion. Marked atrophy of the skeletal musculature of the retroperitoneum, paraspinal region, and lower extremities bilaterally. IMPRESSION: 1. No evidence of bowel obstruction. 2. Acute calculus cholecystitis. 3. New peribronchovascular nodule or infiltrate within the visualized right lung base, likely related to acute infection. 4. Numerous nonobstructing renal calculi bilaterally measuring up to 8 mm, without hydronephrosis. 5. Marked atrophy of the skeletal musculature of the retroperitoneum, paraspinal region, and lower extremities bilaterally in keeping with the patient's known als . Electronically signed by: Dorethia Molt MD 07/02/2024 09:17 PM EST RP Workstation: [id number]   DG Chest Port 1 View Result Date: 07/02/2024 EXAM: 1 VIEW(S) XRAY OF THE CHEST 07/02/2024 06:35:00 PM COMPARISON: 05/28/2024 CLINICAL HISTORY: Questionable sepsis - evaluate for abnormality FINDINGS: LINES, TUBES AND DEVICES: Tracheostomy tube in place over tracheal air column. Right IJ dual lumen central venous catheter with tip at right atrium. Percutaneous gastrojejunostomy tube in  upper abdomen, likely coiled within the gastric lumen. LUNGS AND PLEURA: Low lung  volumes. Mildly elevated left hemidiaphragm. No focal pulmonary opacity. No pleural effusion. No pneumothorax. HEART AND MEDIASTINUM: No acute abnormality of the cardiac and mediastinal silhouettes. BONES AND SOFT TISSUES: No acute osseous abnormality. IMPRESSION: 1. No acute cardiopulmonary abnormality. 2. Tracheostomy tube and right IJ dual lumen central venous catheter in place. 3. Percutaneous gastrojejunostomy tube likely coiled within the gastric lumen. Electronically signed by: Dorethia Molt MD 07/02/2024 08:47 PM EST RP Workstation: [id number]    Anti-infectives: Anti-infectives (From admission, onward)    Start     Dose/Rate Route Frequency Ordered Stop   07/03/24 0600  piperacillin -tazobactam (ZOSYN ) IVPB 3.375 g       Placed in Followed by Linked Group   3.375 g 12.5 mL/hr over 240 Minutes Intravenous Every 8 hours 07/02/24 2155     07/02/24 2200  piperacillin -tazobactam (ZOSYN ) IVPB 3.375 g       Placed in Followed by Linked Group   3.375 g 100 mL/hr over 30 Minutes Intravenous  Once 07/02/24 2155 07/02/24 2243         Latest Ref Rng & Units 07/03/2024    3:21 AM 07/02/2024    6:04 PM 03/12/2024    2:25 AM  Hepatic Function  Total Protein 6.5 - 8.1 g/dL 8.6  9.8    Albumin 3.5 - 5.0 g/dL 4.1  4.5  2.8   AST 15 - 41 U/L 54  60    ALT 0 - 44 U/L 150  177    Alk Phosphatase 38 - 126 U/L 421  497    Total Bilirubin 0.0 - 1.2 mg/dL 1.2  1.3       Assessment/Plan  Jerry [name] is an 53 y.o. male with recent acute calculous cholecystitis treated with percutaneous cholecystostomy tube which was removed in November.  He presents to the hospital today in DKA, without a history of diabetes.  He has continued purulent drainage from his cholecystostomy tube site and evidence of cholecystitis. - afebrile, hemodynamically stable, leukocytosis resolved - IR placed perc chole tube yesterday 12/30, follow cultures - IR injection study suggests cholecystoduodenal fistula. Given this,  cholecystectomy is not recommended. I discussed this with the patient and I also called his wife to discuss this findings and its implications. I answered her questions. - no emergent surgical needs, CCS will follow;    LOS: 1 day   I reviewed nursing notes, hospitalist notes, last 24 h vitals and pain scores, last 48 h intake and output, last 24 h labs and trends, and last 24 h imaging results.  This care required moderate level of medical decision making.   Almarie Pringle, PA-C [facility] Please see Amion for pager number during day hours 7:00am-4:30pm

## 2024-07-04 NOTE — Progress Notes (Signed)
 " Progress Note    Jerry [name]   FMW:[id number]  DOB: 05-09-1971  DOA: 07/02/2024     1 PCP: [facility]  Initial CC: fatigue  Hospital Course: Jerry [name] is a 53 year old male with PMH advanced ALS now with trach/ventilator dependence, PEG tube, previously prediabetes who presented with dry mouth and fatigue.  He was also found to be tachycardic with elevated glucose levels.  Workup was notable for DKA and he was started on insulin  drip and fluids.  He has a history of acute cholecystitis, and underwent percutaneous cholecystostomy tube via interventional radiology on 05/29/2024. The patient has not indicated any new abdominal discomfort recently, which represents a change from the abdominal pain he was experiencing at time of diagnosis of acute cholecystitis in November 2025.  Appears that cholecystostomy tube has has been removed since 05/29/2024.  General surgery consulted on admission for cholecystitis assistance and pulmonology for vent management.  Interval History:  Underwent cholecystostomy tube replacement yesterday.  Tolerated well and he is resting comfortably this morning.  Still complaining of dry mouth.  Assessment and Plan: * DKA (diabetic ketoacidosis) (HCC)-resolved as of 07/03/2024 - Appears to have progressed to diabetes from prediabetes; some contribution likely from underlying infection as well -Treated with DKA protocol and responded well - Now transitioned to subcutaneous insulins  Cholecystitis - Initially underwent percutaneous cholecystostomy tube placement on 02/26/2024 when initially diagnosed with acute cholecystitis. -Recent tube exchange on 05/29/2024 and at some point after that has become removed - Underwent consult with general surgery again on admission with recommendation for repeat placement of tube given CT showing acute calculous cholecystitis - Cholecystostomy tube replaced by IR on 07/03/2024 -Continue Zosyn  for now -Follow-up  general surgery recommendations regarding when to resume tube feeds  DMII (diabetes mellitus, type 2) (HCC) - Prior A1c's around 5.7 to 6%.  Now has A1c 8.8%.  Discussed with wife that may need insulin  at discharge - RD also consulted for assistance with change in tube feed as well once resumed - Continue Lantus  and SSI.  Will modify as needed  Acute hypernatremia - In setting of dehydration and DKA -Continue trending BMP  Hypercalcemia - Due to dehydration and volume depletion - Improved with fluids  Chronic respiratory failure (HCC) - Vent dependent in setting of ALS - Pulmonology managing vent  ALS (amyotrophic lateral sclerosis) (HCC) - Dependent on ventilator and PEG tube for nutrition -Cared for by wife at home  Dehydration-resolved as of 07/03/2024 - S/p IV fluids with improvement in BUN    Antimicrobials: Zosyn  07/02/2024 >> current  DVT prophylaxis:  enoxaparin  (LOVENOX ) injection 40 mg Start: 07/03/24 1445 Lovenox    Code Status:   Code Status: Full Code  Mobility Assessment (Last 72 Hours)     Mobility Assessment     Row Name 07/04/24 0052 07/03/24 0741 07/03/24 0200       Does the patient have exclusion criteria? Yes- Bedfast (Level 1) - Select exclusion criteria in next row Yes- Bedfast (Level 1) - Select exclusion criteria in next row No- Perform mobility assessment     Mobility Assessment Exclusion Criteria Order to exclude patient from protocol Order to exclude patient from protocol --     What is the highest level of mobility based on the mobility assessment? -- -- Level 1 (Bedfast) - Unable to balance while sitting on edge of bed        Diet: Diet Orders (From admission, onward)     Start     Ordered  07/02/24 2317  Diet NPO time specified  Diet effective now        07/02/24 2317            Barriers to discharge: none Disposition Plan:  Home  HH orders placed: TBD Status is: Inpt  Objective: Blood pressure 118/80, pulse (!) 109,  temperature 98.7 F (37.1 C), temperature source Oral, resp. rate 16, height 5' 5 (1.651 m), weight 74.8 kg, SpO2 100%.  Examination:  Physical Exam Constitutional:      Comments: Chronically ill-appearing adult man resting in bed in no distress  HENT:     Head: Normocephalic and atraumatic.     Mouth/Throat:     Mouth: Mucous membranes are dry.  Eyes:     Extraocular Movements: Extraocular movements intact.  Cardiovascular:     Rate and Rhythm: Regular rhythm. Tachycardia present.  Pulmonary:     Effort: Pulmonary effort is normal. No respiratory distress.     Breath sounds: Normal breath sounds.  Abdominal:     General: Bowel sounds are normal. There is distension.     Palpations: Abdomen is soft.     Tenderness: There is abdominal tenderness (mild nonspecific).     Comments: Cholecystostomy tube in place  Musculoskeletal:        General: Normal range of motion.     Cervical back: Normal range of motion and neck supple.  Skin:    General: Skin is warm and dry.  Neurological:     Mental Status: Mental status is at baseline.     Comments: Uses tablet device to communicate; LE paralysis noted      Consultants:  Pulmonology General surgery IR  Procedures:    Data Reviewed: Results for orders placed or performed during the hospital encounter of 07/02/24 (from the past 24 hours)  Aerobic/Anaerobic Culture w Gram Stain (surgical/deep wound)     Status: None (Preliminary result)   Collection Time: 07/03/24  4:23 PM   Specimen: Gallbladder; Abscess  Result Value Ref Range   Specimen Description GALL BLADDER    Special Requests NONE    Gram Stain      MODERATE WBC PRESENT,BOTH PMN AND MONONUCLEAR FEW GRAM POSITIVE COCCI FEW GRAM NEGATIVE RODS    Culture      TOO YOUNG TO READ Performed at [facility], [address], [place], [place] [address]    Report Status PENDING   Glucose, capillary     Status: Abnormal   Collection Time: 07/03/24  4:43 PM  Result  Value Ref Range   Glucose-Capillary 191 (H) 70 - 99 mg/dL  Glucose, capillary     Status: Abnormal   Collection Time: 07/03/24  7:30 PM  Result Value Ref Range   Glucose-Capillary 196 (H) 70 - 99 mg/dL  Glucose, capillary     Status: Abnormal   Collection Time: 07/03/24 11:07 PM  Result Value Ref Range   Glucose-Capillary 156 (H) 70 - 99 mg/dL  Glucose, capillary     Status: Abnormal   Collection Time: 07/04/24  3:16 AM  Result Value Ref Range   Glucose-Capillary 156 (H) 70 - 99 mg/dL  Basic metabolic panel with GFR     Status: Abnormal   Collection Time: 07/04/24  4:02 AM  Result Value Ref Range   Sodium 156 (H) 135 - 145 mmol/L   Potassium 3.0 (L) 3.5 - 5.1 mmol/L   Chloride 122 (H) 98 - 111 mmol/L   CO2 20 (L) 22 - 32 mmol/L  Glucose, Bld 193 (H) 70 - 99 mg/dL   BUN 14 6 - 20 mg/dL   Creatinine, Ser 9.52 (L) 0.61 - 1.24 mg/dL   Calcium  9.9 8.9 - 10.3 mg/dL   GFR, Estimated >39 >39 mL/min   Anion gap 13 5 - 15  CBC with Differential/Platelet     Status: Abnormal   Collection Time: 07/04/24  4:02 AM  Result Value Ref Range   WBC 8.9 4.0 - 10.5 K/uL   RBC 4.87 4.22 - 5.81 MIL/uL   Hemoglobin 12.1 (L) 13.0 - 17.0 g/dL   HCT 59.9 60.9 - 47.9 %   MCV 82.1 80.0 - 100.0 fL   MCH 24.8 (L) 26.0 - 34.0 pg   MCHC 30.3 30.0 - 36.0 g/dL   RDW 79.3 (H) 88.4 - 84.4 %   Platelets 158 150 - 400 K/uL   nRBC 0.0 0.0 - 0.2 %   Neutrophils Relative % 72 %   Neutro Abs 6.4 1.7 - 7.7 K/uL   Lymphocytes Relative 20 %   Lymphs Abs 1.8 0.7 - 4.0 K/uL   Monocytes Relative 6 %   Monocytes Absolute 0.5 0.1 - 1.0 K/uL   Eosinophils Relative 2 %   Eosinophils Absolute 0.1 0.0 - 0.5 K/uL   Basophils Relative 0 %   Basophils Absolute 0.0 0.0 - 0.1 K/uL   Immature Granulocytes 0 %   Abs Immature Granulocytes 0.02 0.00 - 0.07 K/uL  Magnesium      Status: None   Collection Time: 07/04/24  4:02 AM  Result Value Ref Range   Magnesium  2.1 1.7 - 2.4 mg/dL  Glucose, capillary     Status: Abnormal    Collection Time: 07/04/24  7:37 AM  Result Value Ref Range   Glucose-Capillary 173 (H) 70 - 99 mg/dL  Glucose, capillary     Status: Abnormal   Collection Time: 07/04/24 11:31 AM  Result Value Ref Range   Glucose-Capillary 160 (H) 70 - 99 mg/dL    I have reviewed pertinent nursing notes, vitals, labs, and images as necessary. I have ordered labwork to follow up on as indicated.  I have reviewed the last notes from staff over past 24 hours. I have discussed patient's care plan and test results with nursing staff, CM/SW, and other staff as appropriate.  Old records reviewed in assessment of this patient  Time spent: Greater than 50% of the 55 minute visit was spent in counseling/coordination of care for the patient as laid out in the A&P.   LOS: 1 day   Alm Apo, MD [facility] 07/04/2024, 1:38 PM "

## 2024-07-04 NOTE — Procedures (Signed)
 Tracheostomy Change Note  Patient Details:   Name: Jerry [name] DOB: 08-Mar-1971 MRN: [id number]    Airway Documentation:     Evaluation  O2 sats: stable throughout Complications: No apparent complications Patient did tolerate procedure well. Bilateral Breath Sounds: Rhonchi  Pt trach change by RT x 2 w/o complication. BBS noted, Positive color change noted.  Leontine Murtis GAILS 07/04/2024, 9:33 AM

## 2024-07-04 NOTE — Plan of Care (Signed)
" °  Problem: Respiratory: Goal: Ability to maintain a clear airway and adequate ventilation will improve Outcome: Progressing   Problem: Role Relationship: Goal: Method of communication will improve Outcome: Progressing   Problem: Coping: Goal: Level of anxiety will decrease Outcome: Progressing   "

## 2024-07-04 NOTE — Progress Notes (Signed)
 Initial Nutrition Assessment  DOCUMENTATION CODES:   Not applicable  INTERVENTION:  Initiate tube feeding via PEG-J once cleared by CCS: Glucerna 1.5 at 45 ml/h (1080 ml per day) Start at 25 and advance by 10mL every 6 hours to reach goal Free water : every 4 hours Provides 1620 kcal, 89 gm protein, 820 ml free water  daily ( free water , TF+flush)   NUTRITION DIAGNOSIS:   Inadequate oral intake related to inability to eat as evidenced by NPO status.  GOAL:   Patient will meet greater than or equal to 90% of their needs  MONITOR:   TF tolerance, I & O's, Labs  REASON FOR ASSESSMENT:   Consult Enteral/tube feeding initiation and management  ASSESSMENT:  Pt with hx of ALS with trach/GJ-tube, and DM type 2 presented to ED with lethargy. Workup consistent with acute cholecystitis and DKA.   12/29 - presented to ED 12/30 - Cholecystostomy tube replaced in IR   Patient is currently intubated on home ventilator support via trach. Known to RD team from previous admissions. Last assessment was 11/26.  Pt previously with perc chole tube in place for several months. Was removed during admission in November. Replaced yesterday in IR.   Increase in HgbA1c and pt now with level consistent with DM and admitted in DKA (likely due to infection). Md requests that TF formulas with lower carb content be explored to determine if better glycemic control can be achieved outpatient. Will trial glucerna 1.5. MD waiting on surgery for OK to resume enteral feeds. Recommendations above.  MV: 8.5 L/min Temp (24hrs), Avg:98.7 F (37.1 C), Min:98.4 F (36.9 C), Max:99 F (37.2 C) MAP (cuff): 82-17mmHg this AM  Admit / Current weight: 74.8 kg    Intake/Output Summary (Last 24 hours) at 07/04/2024 1312 Last data filed at 07/04/2024 9072 Gross per 24 hour  Intake 117.99 ml  Output 1305 ml  Net -1187.01 ml  Net IO Since Admission: 1,881.6 mL [07/04/24  1312]  Drains/Lines: Implanted port PEG-J, 24 Fr. LUQ Chole tube 25mL out x 24 hours Tracheostomy Shiley, 8mm cuffed out x 24 hours  Nutritionally Relevant Medications: Scheduled Meds:  insulin  aspart  0-15 Units Subcutaneous Q4H   insulin  glargine  5 Units Subcutaneous Daily   Continuous Infusions:  lactated ringers  100 mL/hr at 07/04/24 9079   piperacillin -tazobactam (ZOSYN )  IV 12.5 mL/hr at 07/04/24 9396   potassium chloride  10 mEq (07/04/24 1143)   PRN Meds:.dextrose , ondansetron   Labs Reviewed: Sodium 156, chloride 122 Potassium 3.0 Creatinine 0.47 Phosphorus 1.6 CBG ranges from 156-196 mg/dL over the last 24 hours HgbA1c 8.8%  NUTRITION - FOCUSED PHYSICAL EXAM: Defer to in-person assessment  Diet Order:   Diet Order             Diet NPO time specified  Diet effective now                   EDUCATION NEEDS:   No education needs have been identified at this time  Skin:  Skin Assessment: Reviewed RN Assessment  Last BM:  PTA  Height:   Ht Readings from Last 1 Encounters:  07/02/24 5' 5 (1.651 m)    Weight:   Wt Readings from Last 1 Encounters:  07/03/24 74.8 kg    Ideal Body Weight:  61.8 kg  BMI:  Body mass index is 27.44 kg/m.  Estimated Nutritional Needs:   Kcal:  1600-1800 kcal/d  Protein:  80-95 g/d  Fluid:  1.8L/d    Vernell  Katrinka, RD, LDN, CNSC Registered Dietitian II Please reach out via secure chat

## 2024-07-05 DIAGNOSIS — Z794 Long term (current) use of insulin: Secondary | ICD-10-CM | POA: Diagnosis not present

## 2024-07-05 DIAGNOSIS — K819 Cholecystitis, unspecified: Secondary | ICD-10-CM | POA: Diagnosis not present

## 2024-07-05 DIAGNOSIS — E119 Type 2 diabetes mellitus without complications: Secondary | ICD-10-CM | POA: Diagnosis not present

## 2024-07-05 DIAGNOSIS — E1110 Type 2 diabetes mellitus with ketoacidosis without coma: Secondary | ICD-10-CM | POA: Diagnosis not present

## 2024-07-05 LAB — BASIC METABOLIC PANEL WITH GFR
Anion gap: 11 (ref 5–15)
BUN: 12 mg/dL (ref 6–20)
CO2: 22 mmol/L (ref 22–32)
Calcium: 9.7 mg/dL (ref 8.9–10.3)
Chloride: 124 mmol/L — ABNORMAL HIGH (ref 98–111)
Creatinine, Ser: 0.31 mg/dL — ABNORMAL LOW (ref 0.61–1.24)
GFR, Estimated: >60 mL/min
Glucose, Bld: 161 mg/dL — ABNORMAL HIGH (ref 70–99)
Potassium: 3.2 mmol/L — ABNORMAL LOW (ref 3.5–5.1)
Sodium: 157 mmol/L — ABNORMAL HIGH (ref 135–145)

## 2024-07-05 LAB — GLUCOSE, CAPILLARY
Glucose-Capillary: 156 mg/dL — ABNORMAL HIGH (ref 70–99)
Glucose-Capillary: 158 mg/dL — ABNORMAL HIGH (ref 70–99)
Glucose-Capillary: 160 mg/dL — ABNORMAL HIGH (ref 70–99)
Glucose-Capillary: 175 mg/dL — ABNORMAL HIGH (ref 70–99)
Glucose-Capillary: 207 mg/dL — ABNORMAL HIGH (ref 70–99)
Glucose-Capillary: 208 mg/dL — ABNORMAL HIGH (ref 70–99)

## 2024-07-05 LAB — CULTURE, BLOOD (ROUTINE X 2)

## 2024-07-05 LAB — MAGNESIUM: Magnesium: 2 mg/dL (ref 1.7–2.4)

## 2024-07-05 LAB — PHOSPHORUS: Phosphorus: 2.1 mg/dL — ABNORMAL LOW (ref 2.5–4.6)

## 2024-07-05 MED ORDER — GLUCERNA 1.5 CAL PO LIQD
1000.0000 mL | ORAL | Status: DC
  Administered 2024-07-05 – 2024-07-07 (×3): 1000 mL
  Filled 2024-07-05 (×6): qty 1000

## 2024-07-05 MED ORDER — POTASSIUM CHLORIDE 10 MEQ/50ML IV SOLN
10.0000 meq | INTRAVENOUS | Status: AC
  Administered 2024-07-05 (×4): 10 meq via INTRAVENOUS
  Filled 2024-07-05 (×4): qty 50

## 2024-07-05 MED ORDER — FREE WATER
200.0000 mL | Freq: Four times a day (QID) | Status: DC
  Administered 2024-07-05 – 2024-07-07 (×10): 200 mL

## 2024-07-05 NOTE — Progress Notes (Signed)
 " Progress Note    Jerry [name]   FMW:[id number]  DOB: [birth year]  DOA: 07/02/2024     2 PCP: [facility]  Initial CC: fatigue  Hospital Course: Jerry [name] is a 54 year old male with PMH advanced ALS now with trach/ventilator dependence, PEG tube, previously prediabetes who presented with dry mouth and fatigue.  He was also found to be tachycardic with elevated glucose levels.  Workup was notable for DKA and he was started on insulin  drip and fluids.  He has a history of acute cholecystitis, and underwent percutaneous cholecystostomy tube via interventional radiology on 05/29/2024. The patient has not indicated any new abdominal discomfort recently, which represents a change from the abdominal pain he was experiencing at time of diagnosis of acute cholecystitis in November 2025.  Appears that cholecystostomy tube has has been removed since 05/29/2024.  General surgery consulted on admission for cholecystitis assistance and pulmonology for vent management.  Interval History:  No change clinically.  Stable this morning. Tube feeds started at trickle yesterday and we will further uptitrate today.  Assessment and Plan: * DKA (diabetic ketoacidosis) (HCC)-resolved as of 07/03/2024 - Appears to have progressed to diabetes from prediabetes; some contribution likely from underlying infection as well -Treated with DKA protocol and responded well - Now transitioned to subcutaneous insulins  Cholecystitis - Initially underwent percutaneous cholecystostomy tube placement on 02/26/2024 when initially diagnosed with acute cholecystitis. -Recent tube exchange on 05/29/2024 and at some point after that has become removed - Underwent consult with general surgery again on admission with recommendation for repeat placement of tube given CT showing acute calculous cholecystitis - Cholecystostomy tube replaced by IR on 07/03/2024 -Continue Zosyn  for now; will transition to Augmentin  to  complete course at discharge -Tube feeds resumed at trickle rate 12/31; okay to continue up titration on 1/1  DMII (diabetes mellitus, type 2) (HCC) - Prior A1c's around 5.7 to 6%.  Now has A1c 8.8%.  Discussed with wife that may need insulin  at discharge - RD also consulted for assistance; tube feeds changed to Glucerna - Continue Lantus  and SSI.  Will modify as needed  Acute hypernatremia - In setting of dehydration and DKA -Continue trending BMP  Hypercalcemia - Due to dehydration and volume depletion - Improved with fluids  Chronic respiratory failure (HCC) - Vent dependent in setting of ALS - Pulmonology managing vent  ALS (amyotrophic lateral sclerosis) (HCC) - Dependent on ventilator and PEG tube for nutrition -Cared for by wife at home  Dehydration-resolved as of 07/03/2024 - S/p IV fluids with improvement in BUN    Antimicrobials: Zosyn  07/02/2024 >> current  DVT prophylaxis:  enoxaparin  (LOVENOX ) injection 40 mg Start: 07/03/24 1445 Lovenox    Code Status:   Code Status: Full Code  Mobility Assessment (Last 72 Hours)     Mobility Assessment     Row Name 07/05/24 0800 07/04/24 2007 07/04/24 0052 07/03/24 0741 07/03/24 0200   Does the patient have exclusion criteria? Yes- Bedfast (Level 1) - Select exclusion criteria in next row Yes- Bedfast (Level 1) - Select exclusion criteria in next row Yes- Bedfast (Level 1) - Select exclusion criteria in next row Yes- Bedfast (Level 1) - Select exclusion criteria in next row No- Perform mobility assessment   Mobility Assessment Exclusion Criteria Order to exclude patient from protocol Order to exclude patient from protocol Order to exclude patient from protocol Order to exclude patient from protocol --   What is the highest level of mobility based on the mobility assessment? Level  1 (Bedfast) - Unable to balance while sitting on edge of bed -- -- -- Level 1 (Bedfast) - Unable to balance while sitting on edge of bed   Is the  above level different from baseline mobility prior to current illness? No - Consider discontinuing PT/OT -- -- -- --      Diet: Diet Orders (From admission, onward)     Start     Ordered   07/02/24 2317  Diet NPO time specified  Diet effective now        07/02/24 2317            Barriers to discharge: none Disposition Plan:  Home  HH orders placed: TBD Status is: Inpt  Objective: Blood pressure (!) 136/91, pulse 84, temperature 98.3 F (36.8 C), temperature source Axillary, resp. rate 16, height 5' 5 (1.651 m), weight 74.8 kg, SpO2 100%.  Examination:  Physical Exam Constitutional:      Comments: Chronically ill-appearing adult man resting in bed in no distress  HENT:     Head: Normocephalic and atraumatic.     Mouth/Throat:     Mouth: Mucous membranes are dry.  Eyes:     Extraocular Movements: Extraocular movements intact.  Cardiovascular:     Rate and Rhythm: Regular rhythm. Tachycardia present.  Pulmonary:     Effort: Pulmonary effort is normal. No respiratory distress.     Breath sounds: Normal breath sounds.  Abdominal:     General: Bowel sounds are normal. There is distension (Improved).     Palpations: Abdomen is soft.     Tenderness: There is abdominal tenderness (mild nonspecific).     Comments: Cholecystostomy tube in place  Musculoskeletal:        General: Normal range of motion.     Cervical back: Normal range of motion and neck supple.  Skin:    General: Skin is warm and dry.  Neurological:     Mental Status: Mental status is at baseline.     Comments: Uses tablet device to communicate; LE paralysis noted      Consultants:  Pulmonology General surgery IR  Procedures:    Data Reviewed: Results for orders placed or performed during the hospital encounter of 07/02/24 (from the past 24 hours)  Glucose, capillary     Status: Abnormal   Collection Time: 07/04/24  3:22 PM  Result Value Ref Range   Glucose-Capillary 125 (H) 70 - 99 mg/dL   Glucose, capillary     Status: Abnormal   Collection Time: 07/04/24  7:49 PM  Result Value Ref Range   Glucose-Capillary 122 (H) 70 - 99 mg/dL  Glucose, capillary     Status: Abnormal   Collection Time: 07/04/24 11:25 PM  Result Value Ref Range   Glucose-Capillary 133 (H) 70 - 99 mg/dL  Glucose, capillary     Status: Abnormal   Collection Time: 07/05/24  3:38 AM  Result Value Ref Range   Glucose-Capillary 158 (H) 70 - 99 mg/dL  Basic metabolic panel with GFR     Status: Abnormal   Collection Time: 07/05/24  3:46 AM  Result Value Ref Range   Sodium 157 (H) 135 - 145 mmol/L   Potassium 3.2 (L) 3.5 - 5.1 mmol/L   Chloride 124 (H) 98 - 111 mmol/L   CO2 22 22 - 32 mmol/L   Glucose, Bld 161 (H) 70 - 99 mg/dL   BUN 12 6 - 20 mg/dL   Creatinine, Ser 9.68 (L) 0.61 - 1.24 mg/dL  Calcium  9.7 8.9 - 10.3 mg/dL   GFR, Estimated >39 >39 mL/min   Anion gap 11 5 - 15  Magnesium      Status: None   Collection Time: 07/05/24  3:46 AM  Result Value Ref Range   Magnesium  2.0 1.7 - 2.4 mg/dL  Glucose, capillary     Status: Abnormal   Collection Time: 07/05/24  7:53 AM  Result Value Ref Range   Glucose-Capillary 156 (H) 70 - 99 mg/dL  Glucose, capillary     Status: Abnormal   Collection Time: 07/05/24 11:59 AM  Result Value Ref Range   Glucose-Capillary 160 (H) 70 - 99 mg/dL    I have reviewed pertinent nursing notes, vitals, labs, and images as necessary. I have ordered labwork to follow up on as indicated.  I have reviewed the last notes from staff over past 24 hours. I have discussed patient's care plan and test results with nursing staff, CM/SW, and other staff as appropriate.  Old records reviewed in assessment of this patient  Time spent: Greater than 50% of the 55 minute visit was spent in counseling/coordination of care for the patient as laid out in the A&P.   LOS: 2 days   Alm Apo, MD [facility] 07/05/2024, 2:31 PM "

## 2024-07-05 NOTE — Plan of Care (Signed)
  Problem: Activity: Goal: Ability to tolerate increased activity will improve Outcome: Not Progressing   Problem: Respiratory: Goal: Ability to maintain a clear airway and adequate ventilation will improve Outcome: Not Progressing   Problem: Role Relationship: Goal: Method of communication will improve Outcome: Not Progressing   Problem: Education: Goal: Knowledge of General Education information will improve Description: Including pain rating scale, medication(s)/side effects and non-pharmacologic comfort measures Outcome: Not Progressing   Problem: Health Behavior/Discharge Planning: Goal: Ability to manage health-related needs will improve Outcome: Not Progressing   Problem: Clinical Measurements: Goal: Ability to maintain clinical measurements within normal limits will improve Outcome: Not Progressing Goal: Will remain free from infection Outcome: Not Progressing Goal: Diagnostic test results will improve Outcome: Not Progressing Goal: Respiratory complications will improve Outcome: Not Progressing Goal: Cardiovascular complication will be avoided Outcome: Not Progressing   Problem: Activity: Goal: Risk for activity intolerance will decrease Outcome: Not Progressing   Problem: Nutrition: Goal: Adequate nutrition will be maintained Outcome: Not Progressing   Problem: Coping: Goal: Level of anxiety will decrease Outcome: Not Progressing   Problem: Elimination: Goal: Will not experience complications related to bowel motility Outcome: Not Progressing Goal: Will not experience complications related to urinary retention Outcome: Not Progressing   Problem: Pain Managment: Goal: General experience of comfort will improve and/or be controlled Outcome: Not Progressing   Problem: Safety: Goal: Ability to remain free from injury will improve Outcome: Not Progressing   Problem: Skin Integrity: Goal: Risk for impaired skin integrity will decrease Outcome: Not  Progressing

## 2024-07-05 NOTE — Progress Notes (Signed)
 1700 patient alert x4 able to make all needs known with communication board. Patient allowed bath after bowel movement but refused to have Q2 turns

## 2024-07-05 NOTE — Progress Notes (Signed)
 [facility] Progress Note     Subjective: CC:  Resting comfortably Afebrile VSS  Objective: Vital signs in last 24 hours: Temp:  [97.4 F (36.3 C)-99 F (37.2 C)] 98.7 F (37.1 C) (01/01 0755) Pulse Rate:  [81-112] 97 (01/01 0600) Resp:  [16] 16 (01/01 0600) BP: (97-152)/(75-93) 101/78 (01/01 0600) SpO2:  [98 %-100 %] 98 % (01/01 0600) FiO2 (%):  [2 %-28 %] 2 % (12/31 2000) Last BM Date :  (pta)  Intake/Output from previous day: 12/31 0701 - 01/01 0700 In: 2924.2 [I.V.:2179.8; NG/GT:323.3; IV Piggyback:411.1] Out: 403 [Urine:375; Drains:28] Intake/Output this shift: Total I/O In: 337.3 [I.V.:99.9; NG/GT:225; IV Piggyback:12.4] Out: -   PE: Gen:  Alert, NAD Card:  RRR Pulm:  trach, ventilated respirations Abd: Soft, protuberant, non-tender, drain to gravity w/ scant brown fluid Skin: warm and dry, no rashes  Psych: A&Ox3   Lab Results:  Recent Labs    07/03/24 0321 07/04/24 0402  WBC 12.1* 8.9  HGB 12.7* 12.1*  HCT 41.9 40.0  PLT 182 158   BMET Recent Labs    07/04/24 0402 07/05/24 0346  NA 156* 157*  K 3.0* 3.2*  CL 122* 124*  CO2 20* 22  GLUCOSE 193* 161*  BUN 14 12  CREATININE 0.47* 0.31*  CALCIUM  9.9 9.7   PT/INR Recent Labs    07/02/24 1804  LABPROT 13.6  INR 1.0   CMP     Component Value Date/Time   NA 157 (H) 07/05/2024 0346   NA 139 05/13/2023 0000   K 3.2 (L) 07/05/2024 0346   CL 124 (H) 07/05/2024 0346   CO2 22 07/05/2024 0346   GLUCOSE 161 (H) 07/05/2024 0346   BUN 12 07/05/2024 0346   BUN 18 05/13/2023 0000   CREATININE 0.31 (L) 07/05/2024 0346   CREATININE 0.82 10/03/2018 1037   CALCIUM  9.7 07/05/2024 0346   CALCIUM  9.0 08/19/2023 0000   PROT 8.6 (H) 07/03/2024 0321   ALBUMIN 4.1 07/03/2024 0321   AST 54 (H) 07/03/2024 0321   ALT 150 (H) 07/03/2024 0321   ALKPHOS 421 (H) 07/03/2024 0321   BILITOT 1.2 07/03/2024 0321   GFRNONAA >60 07/05/2024 0346   GFRNONAA 106 10/03/2018 1037   GFRAA DUP 02/26/2024  0703   GFRAA 160 11/11/2023 0000   GFRAA 123 10/03/2018 1037   Lipase     Component Value Date/Time   LIPASE 20 02/26/2024 0424       Studies/Results: IR Perc Cholecystostomy Result Date: 07/03/2024 INDICATION: Diabetic ketoacidosis and concern for recurrent cholecystitis. Previous cholecystostomy tube has been removed. Persistent drainage from cholecystostomy tube site. EXAM: REPLACEMENT OF CHOLECYSTOSTOMY TUBE WITH FLUOROSCOPY MEDICATIONS: Moderate sedation ANESTHESIA/SEDATION: Moderate (conscious) sedation was employed during this procedure. A total of Versed  1.5 mg and Fentanyl  100 mcg was administered intravenously by the radiology nurse. Total intra-service moderate Sedation Time: 15 minutes. The patient's level of consciousness and vital signs were monitored continuously by radiology nursing throughout the procedure under my direct supervision. FLUOROSCOPY: Radiation Exposure Index (as provided by the fluoroscopic device): 22 mGy Kerma CONTRAST:  20 mL Omnipaque  300 COMPLICATIONS: None immediate. PROCEDURE: Informed consent was obtained for cholecystostomy tube placement/replacement. Maximal Sterile Barrier Technique was utilized including caps, mask, sterile gowns, sterile gloves, sterile drape, hand hygiene and skin antiseptic. A timeout was performed prior to the initiation of the procedure. Patient was placed supine. The right abdomen was prepped and draped in sterile fashion. Kumpe catheter was advanced through the old cholecystostomy tube track using fluoroscopy. Contrast  injection confirmed placement in the irregular gallbladder. Multiple cholangiogram images were obtained. Superstiff Amplatz wire was placed. Ultrasound confirmed that the wire was positioned within the gallbladder. 10 French multipurpose drain was advanced over the wire and reconstituted in the gallbladder fundus region. Drain was flushed with saline and attached to a gravity bag. Skin was anesthetized with 1% lidocaine   and cholecystostomy tube was sutured to skin. FINDINGS: Catheter was successfully advanced into the gallbladder through the old cholecystostomy tube tract. Again noted is a very abnormal appearing gallbladder with filling defects and diffuse mucosal irregularity. Contrast fills the duodenum without visualization of a normal cystic duct or common bile duct. IMPRESSION: 1. Successful replacement of the cholecystostomy tube through the old tract. 2. Re-demonstration of a markedly abnormal gallbladder with mucosal irregularity. 3. Abnormal communication between the gallbladder and the duodenum. A normal cystic duct and common bile duct are not visualized. Electronically Signed   By: Juliene Balder M.D.   On: 07/03/2024 20:48    Anti-infectives: Anti-infectives (From admission, onward)    Start     Dose/Rate Route Frequency Ordered Stop   07/03/24 0600  piperacillin -tazobactam (ZOSYN ) IVPB 3.375 g       Placed in Followed by Linked Group   3.375 g 12.5 mL/hr over 240 Minutes Intravenous Every 8 hours 07/02/24 2155     07/02/24 2200  piperacillin -tazobactam (ZOSYN ) IVPB 3.375 g       Placed in Followed by Linked Group   3.375 g 100 mL/hr over 30 Minutes Intravenous  Once 07/02/24 2155 07/02/24 2243         Latest Ref Rng & Units 07/03/2024    3:21 AM 07/02/2024    6:04 PM 03/12/2024    2:25 AM  Hepatic Function  Total Protein 6.5 - 8.1 g/dL 8.6  9.8    Albumin 3.5 - 5.0 g/dL 4.1  4.5  2.8   AST 15 - 41 U/L 54  60    ALT 0 - 44 U/L 150  177    Alk Phosphatase 38 - 126 U/L 421  497    Total Bilirubin 0.0 - 1.2 mg/dL 1.2  1.3       Assessment/Plan  Jerry [name] is an 54 y.o. male with recent acute calculous cholecystitis treated with percutaneous cholecystostomy tube which was removed in November.  He presents to the hospital today in DKA, without a history of diabetes.  He has continued purulent drainage from his cholecystostomy tube site and evidence of cholecystitis. - afebrile,  hemodynamically stable, leukocytosis resolved - IR placed perc chole tube 12/30, follow cultures - IR injection study suggests cholecystoduodenal fistula. Given this, he is not a candidate for cholecystectomy. General surgery will sign off and will be available as needed. Mgmt of perc chole tube per IR. No role for scheduled outpatient follow up, can see us  as needed.   LOS: 2 days   I reviewed nursing notes, hospitalist notes, last 24 h vitals and pain scores, last 48 h intake and output, last 24 h labs and trends, and last 24 h imaging results.  This care required moderate level of medical decision making.   Almarie Pringle, PA-C [facility] Please see Amion for pager number during day hours 7:00am-4:30pm

## 2024-07-06 ENCOUNTER — Telehealth (HOSPITAL_COMMUNITY): Payer: Self-pay | Admitting: Pharmacy Technician

## 2024-07-06 ENCOUNTER — Other Ambulatory Visit (HOSPITAL_COMMUNITY): Payer: Self-pay

## 2024-07-06 DIAGNOSIS — E1110 Type 2 diabetes mellitus with ketoacidosis without coma: Secondary | ICD-10-CM | POA: Diagnosis not present

## 2024-07-06 DIAGNOSIS — J9610 Chronic respiratory failure, unspecified whether with hypoxia or hypercapnia: Secondary | ICD-10-CM | POA: Diagnosis not present

## 2024-07-06 DIAGNOSIS — Z930 Tracheostomy status: Secondary | ICD-10-CM | POA: Diagnosis not present

## 2024-07-06 DIAGNOSIS — E119 Type 2 diabetes mellitus without complications: Secondary | ICD-10-CM | POA: Diagnosis not present

## 2024-07-06 DIAGNOSIS — Z9911 Dependence on respirator [ventilator] status: Secondary | ICD-10-CM | POA: Diagnosis not present

## 2024-07-06 DIAGNOSIS — G1221 Amyotrophic lateral sclerosis: Secondary | ICD-10-CM | POA: Diagnosis not present

## 2024-07-06 DIAGNOSIS — K819 Cholecystitis, unspecified: Secondary | ICD-10-CM | POA: Diagnosis not present

## 2024-07-06 LAB — GLUCOSE, CAPILLARY
Glucose-Capillary: 212 mg/dL — ABNORMAL HIGH (ref 70–99)
Glucose-Capillary: 228 mg/dL — ABNORMAL HIGH (ref 70–99)
Glucose-Capillary: 224 mg/dL — ABNORMAL HIGH (ref 70–99)
Glucose-Capillary: 224 mg/dL — ABNORMAL HIGH (ref 70–99)
Glucose-Capillary: 203 mg/dL — ABNORMAL HIGH (ref 70–99)
Glucose-Capillary: 211 mg/dL — ABNORMAL HIGH (ref 70–99)

## 2024-07-06 LAB — BASIC METABOLIC PANEL WITH GFR
Anion gap: 10 (ref 5–15)
BUN: 12 mg/dL (ref 6–20)
CO2: 22 mmol/L (ref 22–32)
Calcium: 9.4 mg/dL (ref 8.9–10.3)
Chloride: 119 mmol/L — ABNORMAL HIGH (ref 98–111)
Creatinine, Ser: 0.3 mg/dL — ABNORMAL LOW (ref 0.61–1.24)
GFR, Estimated: >60 mL/min
Glucose, Bld: 241 mg/dL — ABNORMAL HIGH (ref 70–99)
Potassium: 3.3 mmol/L — ABNORMAL LOW (ref 3.5–5.1)
Sodium: 151 mmol/L — ABNORMAL HIGH (ref 135–145)

## 2024-07-06 MED ORDER — SODIUM CHLORIDE 0.9 % IV SOLN
3.0000 g | Freq: Four times a day (QID) | INTRAVENOUS | Status: DC
  Administered 2024-07-06 – 2024-07-07 (×4): 3 g via INTRAVENOUS
  Filled 2024-07-06 (×4): qty 8

## 2024-07-06 MED ORDER — POTASSIUM CHLORIDE 20 MEQ PO PACK
40.0000 meq | PACK | Freq: Once | ORAL | Status: AC
  Administered 2024-07-06: 40 meq
  Filled 2024-07-06: qty 2

## 2024-07-06 MED ORDER — POTASSIUM CHLORIDE 20 MEQ PO PACK: 40.0000 meq | PACK | ORAL | Status: DC

## 2024-07-06 MED ORDER — ALUM & MAG HYDROXIDE-SIMETH 200-200-20 MG/5ML PO SUSP
30.0000 mL | Freq: Three times a day (TID) | ORAL | Status: DC
  Administered 2024-07-06 – 2024-07-07 (×3): 30 mL
  Filled 2024-07-06 (×5): qty 30

## 2024-07-06 MED ORDER — INSULIN GLARGINE 100 UNIT/ML ~~LOC~~ SOLN
10.0000 [IU] | Freq: Every day | SUBCUTANEOUS | Status: DC
  Administered 2024-07-06: 10 [IU] via SUBCUTANEOUS
  Filled 2024-07-06 (×2): qty 0.1

## 2024-07-06 NOTE — Telephone Encounter (Signed)
 Patient Product/process Development Scientist completed.    The patient is insured through [employer]. Patient has Medicare and is not eligible for a copay card, but may be able to apply for patient assistance or Medicare RX Payment Plan (Patient Must reach out to their plan, if eligible for payment plan), if available.    Ran test claim for Lantus  Pen and the current 30 day co-pay is $4.90.  Ran test claim for Novolog  FlexPen and the current 30 day co-pay is $4.90.  Ran test claim for Dexcom G7 Sensor and Requires Prior Authorization  Ran test claim for [employer] 3 Plus Sensor and Requires Prior Authorization  This test claim was processed through [employer]- copay amounts may vary at other pharmacies due to [employer], or as the patient moves through the different stages of their insurance plan.     Reyes Sharps, CPHT Pharmacy Technician Patient Advocate Specialist Lead [facility] Pharmacy Patient Advocate Team Direct Number: [phone]  Fax: [phone]

## 2024-07-06 NOTE — Progress Notes (Signed)
" ° °  NAME:  Jerry [name], MRN:  [id number], DOB:  1970/09/25, LOS: 3 ADMISSION DATE:  07/02/2024, CONSULTATION DATE: 12/29 REFERRING MD: Dr. Lenor, CHIEF COMPLAINT: Ventilator management, history of acute cholecystitis  History of Present Illness:  Patient is a 54 year old male with pertinent PMH advanced ALS with trach/ventilator dependence, prediabetes presents to [facility] on 12/29 with acute cholecystitis.   Patient also recently had previous acute cholecystitis requiring PERC Choley tube to be placed which was removed in November.  On 12/29 patient presents to [facility] with dry mouth and fatigue.  On arrival patient afebrile tachycardia 110s.  Glucose 414, NA 155, CO2 13, AG 26, OSM 368.  pH on VBG 7.41.  Concern for DKA patient started on insulin  drip and given IV fluids.  WBC mildly elevated at 10 and LA WNL.  LFTs elevated.  CT ABD/pelvis showing concern for acute cholecystitis.  Cultures obtained and started on IV Zosyn .  Surgery consulted recommended IR consult.  TRH to admit and PCCM to consult for chronic vent management.  Pertinent  Medical History   Past Medical History:  Diagnosis Date   ALS (amyotrophic lateral sclerosis) (HCC)    COVID-19 virus infection 06/2020   Meningitis 2003   spinal   Morbid obesity (HCC)    Neuromuscular disorder (HCC)    Prediabetes 11/05/2016   A1C 5.7 on 11/05/16   Type 2 diabetes mellitus (HCC) 08/31/2023   Significant Hospital Events: Including procedures, antibiotic start and stop dates in addition to other pertinent events   12/29-admitted with cholecystitis, PCCM consult for vent management 12/30- cholecystectomy tube replaced by interventional radiology  Interim History / Subjective:  Uneventful overnight On home ventilator settings  Objective    Blood pressure (!) 137/92, pulse 70, temperature (!) 96.8 F (36 C), temperature source Axillary, resp. rate 16, height 5' 5 (1.651 m), weight 74.8 kg, SpO2 100%.    Vent Mode: AC FiO2 (%):  [28 %]  28 % Set Rate:  [16 bmp] 16 bmp Vt Set:  [500 mL] 500 mL PEEP:  [5 cmH20] 5 cmH20 Plateau Pressure:  [16 cmH20] 16 cmH20   Intake/Output Summary (Last 24 hours) at 07/06/2024 0830 Last data filed at 07/06/2024 0800 Gross per 24 hour  Intake 2574.05 ml  Output 630 ml  Net 1944.05 ml   Filed Weights   07/03/24 0825  Weight: 74.8 kg    Examination: General: Chronically ill-appearing HENT: Moist oral mucosa, tracheostomy tube in place Lungs: Clear breath sounds, decreased at the bases  cardiovascular: S1-S2 appreciated Abdomen: Soft, bowel sounds appreciated Extremities: No clubbing, no edema Neuro: Arousable, communicates using a tablet GU:   Lab data reviewed Sodium 151 BUN 12, creatinine 0.3   Resolved problem list   Assessment and Plan   Patient with chronic ALS ventilator dependence -Continue ventilator settings - VAP bundle in place  Tracheostomy care per protocol  As needed bronchodilators  Scopolamine  and glycopyrrolate  on hold  Cholecystitis - He had his cholecystostomy tube replaced  Will see a week from here if he remains stable  Jennet Epley, MD [place] P[facility]M Pager: See Amion       "

## 2024-07-06 NOTE — Telephone Encounter (Signed)
 Pharmacy Patient Advocate Encounter  Received notification from [employer] that Prior Authorization for [employer] 3 Plus Sensor  has been APPROVED from 07/05/2024 to 07/04/2025. Ran test claim, Copay is $0.00. This test claim was processed through [facility]- copay amounts may vary at other pharmacies due to pharmacy/plan contracts, or as the patient moves through the different stages of their insurance plan.   PA #/Case ID/Reference #: [id number]

## 2024-07-06 NOTE — Plan of Care (Signed)
" °  Problem: Respiratory: Goal: Ability to maintain a clear airway and adequate ventilation will improve Outcome: Progressing   Problem: Role Relationship: Goal: Method of communication will improve Outcome: Progressing   Problem: Clinical Measurements: Goal: Ability to maintain clinical measurements within normal limits will improve Outcome: Progressing Goal: Respiratory complications will improve Outcome: Progressing Goal: Cardiovascular complication will be avoided Outcome: Progressing   Problem: Clinical Measurements: Goal: Respiratory complications will improve Outcome: Progressing   Problem: Clinical Measurements: Goal: Cardiovascular complication will be avoided Outcome: Progressing   "

## 2024-07-06 NOTE — Inpatient Diabetes Management (Signed)
 Inpatient Diabetes Program Recommendations  AACE/ADA: New Consensus Statement on Inpatient Glycemic Control (2015)  Target Ranges:  Prepandial:   less than 140 mg/dL      Peak postprandial:   less than 180 mg/dL (1-2 hours)      Critically ill patients:  140 - 180 mg/dL   Lab Results  Component Value Date   GLUCAP 224 (H) 07/06/2024   HGBA1C 8.8 (H) 07/03/2024    Review of Glycemic Control  Latest Reference Range & Units 07/05/24 07:53 07/05/24 11:59 07/05/24 15:30 07/05/24 19:39 07/05/24 23:37 07/06/24 03:30 07/06/24 07:38 07/06/24 11:34  Glucose-Capillary 70 - 99 mg/dL 843 (H) 839 (H) 792 (H) 175 (H) 208 (H) 212 (H) 228 (H) 224 (H)  (H): Data is abnormally high  Diabetes history: New onset DM2 Outpatient Diabetes medications: None Current orders for Inpatient glycemic control: Lantus  10 units every day, Noovlog 0-15 units TID  Will likely need insulin  at DC.  Met with Jerry [name] and home care RN, Jerry [name].  Jerry [name] called Jerry [name] and was placed on speaker phone.  Educated them on new diagnosis. Discussed A1C results with them and explained what an A1C is, basic pathophysiology of DM Type 2, basic home care, basic diabetes diet nutrition principles, importance of checking CBGs and maintaining good CBG control to prevent long-term and short-term complications. Reviewed signs and symptoms of hyperglycemia and hypoglycemia and how to treat hypoglycemia at home. Also reviewed blood sugar goals at home.  RNs to provide ongoing basic DM education at bedside with this patient. Have ordered educational booklet, insulin  starter kit, and DM videos. Have also placed RD consult for DM diet education for this patient.    Discussed insulins and the difference between long and rapid insulins and when to administer and insulin  storage and disposal of needles.  Jerry [name] is familiar with the insulin  pen.  Showed Jerry [name] the insulin  pen.  Shared a video with Jerry [name] on how to administer insulin  using the  pen.  His glucose trends are elevated with feeds.  Currently receiving Glucerna continuously.  Explained if feeds are held, insulins should be significantly decreased or held to avoid hypoglycemia.    Lantus  and Novolog  are coved at $4.90 each.  PA is pending on the [employer] 3.    Discussed the FSL3 and they are interested in using a CGM.  He will still need a glucometer as a back up at home.  Will need the reader with the CGM as he does not use a cell phone.  Educated on the CGM.    Thank you, Wyvonna Pinal, MSN, CDCES Diabetes Coordinator Inpatient Diabetes Program [phone] (team pager from 8a-5p)

## 2024-07-06 NOTE — Progress Notes (Signed)
 " Progress Note    Jerry [name]   FMW:[id number]  DOB: 09-Apr-1971  DOA: 07/02/2024     3 PCP: [facility]  Initial CC: fatigue  Hospital Course: Mr. Mccormac is a 54 year old male with PMH advanced ALS now with trach/ventilator dependence, PEG tube, previously prediabetes who presented with dry mouth and fatigue.  He was also found to be tachycardic with elevated glucose levels.  Workup was notable for DKA and he was started on insulin  drip and fluids.  He has a history of acute cholecystitis, and underwent percutaneous cholecystostomy tube via interventional radiology on 05/29/2024. The patient has not indicated any new abdominal discomfort recently, which represents a change from the abdominal pain he was experiencing at time of diagnosis of acute cholecystitis in November 2025.  Appears that cholecystostomy tube has has been removed since 05/29/2024.  General surgery consulted on admission for cholecystitis assistance and pulmonology for vent management.  Interval History:  No change clinically.  Stable this morning. Called and spoke with wife this morning to update her. We tentatively planning on discharge home tomorrow.  Assessment and Plan: * DKA (diabetic ketoacidosis) (HCC)-resolved as of 07/03/2024 - Appears to have progressed to diabetes from prediabetes; some contribution likely from underlying infection as well -Treated with DKA protocol and responded well - Now transitioned to subcutaneous insulins  Cholecystitis - Initially underwent percutaneous cholecystostomy tube placement on 02/26/2024 when initially diagnosed with acute cholecystitis. -Recent tube exchange on 05/29/2024 and at some point after that has become removed - Underwent consult with general surgery again on admission with recommendation for repeat placement of tube given CT showing acute calculous cholecystitis - Cholecystostomy tube replaced by IR on 07/03/2024 -Continue Unasyn  (previously on  Zosyn ); will transition to Augmentin  to complete course at discharge -Tube feeds resumed at trickle rate 12/31; uptitrated on 1/1  DMII (diabetes mellitus, type 2) (HCC) - Prior A1c's around 5.7 to 6%.  Now has A1c 8.8%.  Discussed with wife that may need insulin  at discharge - RD also consulted for assistance; tube feeds changed to Glucerna - Continue Lantus  and SSI.  Will modify as needed - Some increase in CBGs after Glucerna uptitrated.  Insulin  regimen adjusted - Tentative plan for freestyle libre 3 at discharge as well.  Prior Auth completed and approved, $0  Acute hypernatremia - In setting of dehydration and DKA -Continue trending BMP - Free water  flushes modified  Hypercalcemia - Due to dehydration and volume depletion - Improved with fluids  Chronic respiratory failure (HCC) - Vent dependent in setting of ALS - Pulmonology managing vent  ALS (amyotrophic lateral sclerosis) (HCC) - Dependent on ventilator and PEG tube for nutrition -Cared for by wife at home.  Also has home care RN  Dehydration-resolved as of 07/03/2024 - S/p IV fluids with improvement in BUN    Antimicrobials: Zosyn  07/02/2024 >> 07/06/2024 Unasyn  07/06/2024 >> current  DVT prophylaxis:  enoxaparin  (LOVENOX ) injection 40 mg Start: 07/03/24 1445 Lovenox    Code Status:   Code Status: Full Code  Mobility Assessment (Last 72 Hours)     Mobility Assessment     Row Name 07/06/24 0800 07/05/24 2000 07/05/24 0800 07/04/24 2007 07/04/24 0052   Does the patient have exclusion criteria? Yes- Bedfast (Level 1) - Select exclusion criteria in next row Yes- Bedfast (Level 1) - Select exclusion criteria in next row Yes- Bedfast (Level 1) - Select exclusion criteria in next row Yes- Bedfast (Level 1) - Select exclusion criteria in next row Yes- Bedfast (Level  1) - Select exclusion criteria in next row   Mobility Assessment Exclusion Criteria --  ALS-patient's baseline No exclusion criteria present, perform  mobility assessment Order to exclude patient from protocol Order to exclude patient from protocol Order to exclude patient from protocol   What is the highest level of mobility based on the mobility assessment? Level 1 (Bedfast) - Unable to balance while sitting on edge of bed Level 1 (Bedfast) - Unable to balance while sitting on edge of bed Level 1 (Bedfast) - Unable to balance while sitting on edge of bed -- --   Is the above level different from baseline mobility prior to current illness? No - Consider discontinuing PT/OT No - Consider discontinuing PT/OT No - Consider discontinuing PT/OT -- --      Diet: Diet Orders (From admission, onward)     Start     Ordered   07/02/24 2317  Diet NPO time specified  Diet effective now        07/02/24 2317            Barriers to discharge: none Disposition Plan:  Home  HH orders placed: TBD Status is: Inpt  Objective: Blood pressure (!) 135/95, pulse 67, temperature 98.5 F (36.9 C), temperature source Oral, resp. rate 16, height 5' 5 (1.651 m), weight 74.8 kg, SpO2 100%.  Examination:  Physical Exam Constitutional:      Comments: Chronically ill-appearing adult man resting in bed in no distress  HENT:     Head: Normocephalic and atraumatic.     Mouth/Throat:     Mouth: Mucous membranes are dry.  Eyes:     Extraocular Movements: Extraocular movements intact.  Cardiovascular:     Rate and Rhythm: Normal rate and regular rhythm.  Pulmonary:     Effort: Pulmonary effort is normal. No respiratory distress.     Breath sounds: Normal breath sounds.  Abdominal:     General: Bowel sounds are normal. There is distension (Improved).     Palpations: Abdomen is soft.     Tenderness: There is abdominal tenderness (mild nonspecific).     Comments: Cholecystostomy tube in place  Musculoskeletal:        General: Normal range of motion.     Cervical back: Normal range of motion and neck supple.  Skin:    General: Skin is warm and dry.   Neurological:     Mental Status: Mental status is at baseline.     Comments: Uses tablet device to communicate; LE paralysis noted      Consultants:  Pulmonology General surgery IR  Procedures:    Data Reviewed: Results for orders placed or performed during the hospital encounter of 07/02/24 (from the past 24 hours)  Glucose, capillary     Status: Abnormal   Collection Time: 07/05/24  3:30 PM  Result Value Ref Range   Glucose-Capillary 207 (H) 70 - 99 mg/dL  Glucose, capillary     Status: Abnormal   Collection Time: 07/05/24  7:39 PM  Result Value Ref Range   Glucose-Capillary 175 (H) 70 - 99 mg/dL   Comment 1 Notify RN   Glucose, capillary     Status: Abnormal   Collection Time: 07/05/24 11:37 PM  Result Value Ref Range   Glucose-Capillary 208 (H) 70 - 99 mg/dL   Comment 1 Notify RN   Glucose, capillary     Status: Abnormal   Collection Time: 07/06/24  3:30 AM  Result Value Ref Range   Glucose-Capillary 212 (H) 70 -  99 mg/dL   Comment 1 Notify RN   Basic metabolic panel with GFR     Status: Abnormal   Collection Time: 07/06/24  4:00 AM  Result Value Ref Range   Sodium 151 (H) 135 - 145 mmol/L   Potassium 3.3 (L) 3.5 - 5.1 mmol/L   Chloride 119 (H) 98 - 111 mmol/L   CO2 22 22 - 32 mmol/L   Glucose, Bld 241 (H) 70 - 99 mg/dL   BUN 12 6 - 20 mg/dL   Creatinine, Ser 9.69 (L) 0.61 - 1.24 mg/dL   Calcium  9.4 8.9 - 10.3 mg/dL   GFR, Estimated >39 >39 mL/min   Anion gap 10 5 - 15  Glucose, capillary     Status: Abnormal   Collection Time: 07/06/24  7:38 AM  Result Value Ref Range   Glucose-Capillary 228 (H) 70 - 99 mg/dL  Glucose, capillary     Status: Abnormal   Collection Time: 07/06/24 11:34 AM  Result Value Ref Range   Glucose-Capillary 224 (H) 70 - 99 mg/dL    I have reviewed pertinent nursing notes, vitals, labs, and images as necessary. I have ordered labwork to follow up on as indicated.  I have reviewed the last notes from staff over past 24 hours. I  have discussed patient's care plan and test results with nursing staff, CM/SW, and other staff as appropriate.  Old records reviewed in assessment of this patient  Time spent: Greater than 50% of the 55 minute visit was spent in counseling/coordination of care for the patient as laid out in the A&P.   LOS: 3 days   Alm Apo, MD [facility] 07/06/2024, 3:18 PM "

## 2024-07-06 NOTE — TOC Progression Note (Signed)
 Transition of Care [facility]) - Progression Note    Patient Details  Name: Jerry [name] MRN: [id number] Date of Birth: March 09, 1971  Transition of Care [facility]) CM/SW Contact  Roxie KANDICE Stain, RN Phone Number: 07/06/2024, 3:28 PM  Clinical Narrative:      Spoke to wife, Doristine, regarding transition needs.  [name] requesting [facility] health [facility] with [facility] and for this RNCM to update [facility] (private duty nursing agency) by faxing updated information.  [facility]. F[facility]ed clinical updates to [facility] at [phone].Office # [phone]. Mitch with [facility] notified of new tube feeding orders and condom cath, urine drainage bags. Mitch reached out to [facility] with [facility].  Mitch requesting bedside RN send patient home with a few days worth of tube feeding. Bedside rn notified.   Patient needs PTAR transportation home. PTAR paperwork on patient's hard copy chart.   Expected Discharge Plan: Home w Home Health Services Barriers to Discharge: Continued Medical Work up               Expected Discharge Plan and Services   Discharge Planning Services: CM Consult Post Acute Care Choice: Home Health Living arrangements for the past 2 months: Single Family Home                 DME Arranged: Tube feeding, Other see comment (condom cath , urinary drainage) DME Agency: [facility] Date DME Agency Contacted: 07/06/24 Time DME Agency Contacted: [phone] Representative spoke with at DME Agency: Mitch HH Arranged: RN HH Agency: [facility] Date [facility] Agency Contacted: 07/06/24 Time HH Agency Contacted: [phone] Representative spoke with at [facility] Agency: Arna   Social Drivers of Health (SDOH) Interventions SDOH Screenings   Food Insecurity: No Food Insecurity (07/03/2024)  Housing: Low Risk (07/03/2024)  Transportation Needs: No Transportation Needs (07/03/2024)  Utilities: Not At Risk (07/03/2024)  Depression (PHQ2-9): Low Risk (01/24/2024)  Financial Resource Strain: Low  Risk (09/29/2022)  Physical Activity: Inactive (10/11/2023)  Social Connections: Unknown (10/11/2023)  Stress: Stress Concern Present (10/11/2023)  Tobacco Use: Low Risk (07/03/2024)  Health Literacy: Adequate Health Literacy (10/11/2023)    Readmission Risk Interventions    05/31/2024    9:25 AM 03/12/2024    4:10 PM  Readmission Risk Prevention Plan  Transportation Screening Complete Complete  PCP or Specialist Appt within 5-7 Days Complete Complete  Home Care Screening Complete Complete  Medication Review (RN CM) Complete Complete

## 2024-07-06 NOTE — Telephone Encounter (Signed)
 Pharmacy Patient Advocate Encounter   Received notification from Inpatient Request that prior authorization for FreeStyle Libre 3 Plus Sensor  is required/requested.   Insurance verification completed.   The patient is insured through [employer].   Per test claim: PA required; PA submitted to above mentioned insurance via Latent Key/confirmation #/[id number] Status is pending

## 2024-07-07 ENCOUNTER — Other Ambulatory Visit (HOSPITAL_COMMUNITY): Payer: Self-pay

## 2024-07-07 DIAGNOSIS — K819 Cholecystitis, unspecified: Secondary | ICD-10-CM | POA: Diagnosis not present

## 2024-07-07 DIAGNOSIS — E860 Dehydration: Secondary | ICD-10-CM | POA: Diagnosis not present

## 2024-07-07 DIAGNOSIS — E119 Type 2 diabetes mellitus without complications: Secondary | ICD-10-CM | POA: Diagnosis not present

## 2024-07-07 DIAGNOSIS — E1110 Type 2 diabetes mellitus with ketoacidosis without coma: Secondary | ICD-10-CM | POA: Diagnosis not present

## 2024-07-07 LAB — BASIC METABOLIC PANEL WITH GFR
Anion gap: 10 (ref 5–15)
BUN: 12 mg/dL (ref 6–20)
CO2: 23 mmol/L (ref 22–32)
Calcium: 9.4 mg/dL (ref 8.9–10.3)
Chloride: 116 mmol/L — ABNORMAL HIGH (ref 98–111)
Creatinine, Ser: <0.3 mg/dL — ABNORMAL LOW (ref 0.61–1.24)
Glucose, Bld: 216 mg/dL — ABNORMAL HIGH (ref 70–99)
Potassium: 3.4 mmol/L — ABNORMAL LOW (ref 3.5–5.1)
Sodium: 150 mmol/L — ABNORMAL HIGH (ref 135–145)

## 2024-07-07 LAB — CULTURE, BLOOD (ROUTINE X 2): Culture: NO GROWTH

## 2024-07-07 LAB — GLUCOSE, CAPILLARY
Glucose-Capillary: 205 mg/dL — ABNORMAL HIGH (ref 70–99)
Glucose-Capillary: 194 mg/dL — ABNORMAL HIGH (ref 70–99)
Glucose-Capillary: 194 mg/dL — ABNORMAL HIGH (ref 70–99)

## 2024-07-07 MED ORDER — POTASSIUM CHLORIDE 20 MEQ PO PACK
60.0000 meq | PACK | Freq: Once | ORAL | Status: AC
  Administered 2024-07-07: 60 meq
  Filled 2024-07-07: qty 3

## 2024-07-07 MED ORDER — LANCET DEVICE MISC
1.0000 | 0 refills | Status: AC
  Filled 2024-07-07: qty 1, fill #0

## 2024-07-07 MED ORDER — BLOOD GLUCOSE TEST VI STRP
1.0000 | ORAL_STRIP | 5 refills | Status: AC
  Filled 2024-07-07: qty 200, 50d supply, fill #0

## 2024-07-07 MED ORDER — AMOXICILLIN-POT CLAVULANATE 600-42.9 MG/5ML PO SUSR
875.0000 mg | Freq: Two times a day (BID) | ORAL | 0 refills | Status: DC
  Filled 2024-07-07: qty 150, 10d supply, fill #0

## 2024-07-07 MED ORDER — INSULIN GLARGINE 100 UNIT/ML SOLOSTAR PEN
12.0000 [IU] | PEN_INJECTOR | Freq: Every day | SUBCUTANEOUS | 0 refills | Status: AC
  Filled 2024-07-07: qty 15, 125d supply, fill #0

## 2024-07-07 MED ORDER — FREESTYLE LIBRE 3 PLUS SENSOR MISC: 3 refills | Status: AC

## 2024-07-07 MED ORDER — GLUCERNA 1.5 CAL PO LIQD: 1000.0000 mL | ORAL | Status: AC

## 2024-07-07 MED ORDER — AMOXICILLIN-POT CLAVULANATE 400-57 MG/5ML PO SUSR
875.0000 mg | Freq: Two times a day (BID) | ORAL | 0 refills | Status: AC
  Filled 2024-07-07: qty 300, 10d supply, fill #0

## 2024-07-07 MED ORDER — AMOXICILLIN-POT CLAVULANATE 400-57 MG/5ML PO SUSR
875.0000 mg | Freq: Two times a day (BID) | ORAL | 0 refills | Status: DC
  Filled 2024-07-07: qty 218, 10d supply, fill #0

## 2024-07-07 MED ORDER — INSULIN ASPART 100 UNIT/ML FLEXPEN
0.0000 [IU] | PEN_INJECTOR | Freq: Three times a day (TID) | SUBCUTANEOUS | 3 refills | Status: AC
  Filled 2024-07-07: qty 15, 34d supply, fill #0

## 2024-07-07 MED ORDER — BLOOD GLUCOSE MONITOR SYSTEM W/DEVICE KIT
1.0000 | PACK | 0 refills | Status: AC
  Filled 2024-07-07: qty 1, 30d supply, fill #0

## 2024-07-07 MED ORDER — INSULIN PEN NEEDLE 32G X 4 MM MISC
1.0000 | 5 refills | Status: AC
  Filled 2024-07-07: qty 100, 25d supply, fill #0

## 2024-07-07 MED ORDER — FREE WATER: 200.0000 mL | Freq: Four times a day (QID) | Status: AC

## 2024-07-07 MED ORDER — INSULIN GLARGINE 100 UNIT/ML ~~LOC~~ SOLN
12.0000 [IU] | Freq: Every day | SUBCUTANEOUS | Status: DC
  Administered 2024-07-07: 12 [IU] via SUBCUTANEOUS
  Filled 2024-07-07: qty 0.12

## 2024-07-07 MED ORDER — LANCETS MISC
1.0000 | 85 refills | Status: AC
  Filled 2024-07-07: qty 200, 50d supply, fill #0

## 2024-07-07 NOTE — TOC Transition Note (Signed)
 Transition of Care [facility]) - Discharge Note   Patient Details  Name: Jerry [name] MRN: [id number] Date of Birth: [birth year]  [facility]) CM/SW Contact:  Marval Gell, RN Phone Number: 07/07/2024, 10:23 AM   Clinical Narrative:     [facility] and [facility] (home based program) that patient will DC today Verified w PTAR, spoke w [facility] who cleared with supervisor,  that they can transport home vent as long as wife is able to ride with patient and manage the home vent.  Spoke w patient's wife who confirms she will be at the hospital around 11am to go over DC instructions and ride home via [place] with him, and I will set up PTAR for around noon, nurse updated.  Patient will be discharged with meds through [facility] and extra supplies for tube feeds, condom caths and urinary bags    Barriers to Discharge: Continued Medical Work up   Patient Goals and CMS Choice Patient states their goals for this hospitalization and ongoing recovery are:: wife wants patient to return home CMS Medicare.gov Compare Post Acute Care list provided to:: Patient Represenative (must comment) Choice offered to / list presented to : Spouse      Discharge Placement                       Discharge Plan and Services Additional resources added to the After Visit Summary for     Discharge Planning Services: CM Consult Post Acute Care Choice: Home Health          DME Arranged: Tube feeding, Other see comment (condom cath , urinary drainage) DME Agency: [facility] Date DME Agency Contacted: 07/06/24 Time DME Agency Contacted: [phone] Representative spoke with at DME Agency: Mitch HH Arranged: RN HH Agency: [facility] Date [facility] Agency Contacted: 07/06/24 Time HH Agency Contacted: [phone] Representative spoke with at [facility] Agency: Arna  Social Drivers of Health (SDOH) Interventions SDOH Screenings   Food Insecurity: No Food Insecurity (07/03/2024)  Housing: Low Risk (07/03/2024)   Transportation Needs: No Transportation Needs (07/03/2024)  Utilities: Not At Risk (07/03/2024)  Depression (PHQ2-9): Low Risk (01/24/2024)  Financial Resource Strain: Low Risk (09/29/2022)  Physical Activity: Inactive (10/11/2023)  Social Connections: Unknown (10/11/2023)  Stress: Stress Concern Present (10/11/2023)  Tobacco Use: Low Risk (07/03/2024)  Health Literacy: Adequate Health Literacy (10/11/2023)     Readmission Risk Interventions    05/31/2024    9:25 AM 03/12/2024    4:10 PM  Readmission Risk Prevention Plan  Transportation Screening Complete Complete  PCP or Specialist Appt within 5-7 Days Complete Complete  Home Care Screening Complete Complete  Medication Review (RN CM) Complete Complete

## 2024-07-07 NOTE — Progress Notes (Signed)
 Glucerna 1.5, 1 liter bags, #3 delivered to bedside at discharge.    Rankin Sams, PharmD, BCPS, BCCCP Clinical Pharmacist

## 2024-07-07 NOTE — Plan of Care (Signed)
" °  Problem: Respiratory: Goal: Ability to maintain a clear airway and adequate ventilation will improve Outcome: Progressing   Problem: Role Relationship: Goal: Method of communication will improve Outcome: Progressing   Problem: Education: Goal: Knowledge of General Education information will improve Description: Including pain rating scale, medication(s)/side effects and non-pharmacologic comfort measures Outcome: Progressing   Problem: Clinical Measurements: Goal: Ability to maintain clinical measurements within normal limits will improve Outcome: Progressing Goal: Diagnostic test results will improve Outcome: Progressing   Problem: Clinical Measurements: Goal: Diagnostic test results will improve Outcome: Progressing   "

## 2024-07-07 NOTE — Discharge Summary (Signed)
 " Physician Discharge Summary   Jerry [name] FMW:[id number] DOB: [birth year] DOA: 07/02/2024  [facility]P: [facility]  Admit date: 07/02/2024 Discharge date: 07/07/2024  Admitted From: Home Disposition:  Home Discharging physician: Alm Apo, MD Barriers to discharge: none  Recommendations at discharge: Adjust insulin  regimen as necessary  Continue monitoring output of cholecystostomy drain   Discharge Condition: stable CODE STATUS: Full  Diet recommendation:  Diet Orders (From admission, onward)     Start     Ordered   07/02/24 2317  Diet NPO time specified  Diet effective now        07/02/24 2317            Hospital Course: Jerry [name] is a 54 year old male with PMH advanced ALS now with trach/ventilator dependence, PEG tube, previously prediabetes who presented with dry mouth and fatigue.  He was also found to be tachycardic with elevated glucose levels.  Workup was notable for DKA and he was started on insulin  drip and fluids.  He has a history of acute cholecystitis, and underwent percutaneous cholecystostomy tube via interventional radiology on 05/29/2024. The patient has not indicated any new abdominal discomfort recently, which represents a change from the abdominal pain he was experiencing at time of diagnosis of acute cholecystitis in November 2025.  Appears that cholecystostomy tube has has been removed since 05/29/2024.  General surgery consulted on admission for cholecystitis assistance and pulmonology for vent management.  Assessment and Plan: * DKA (diabetic ketoacidosis) (HCC)-resolved as of 07/03/2024 - Appears to have progressed to diabetes from prediabetes; some contribution likely from underlying infection as well -Treated with DKA protocol and responded well - Now transitioned to subcutaneous insulins  Cholecystitis - Initially underwent percutaneous cholecystostomy tube placement on 02/26/2024 when initially diagnosed with acute  cholecystitis. -Recent tube exchange on 05/29/2024 and at some point after that has become removed - Underwent consult with general surgery again on admission with recommendation for repeat placement of tube given CT showing acute calculous cholecystitis - Cholecystostomy tube replaced by IR on 07/03/2024 -Continue Unasyn  (previously on Zosyn ); will transition to Augmentin  to complete course at discharge -Tube feeds resumed at trickle rate 12/31; uptitrated on 1/1; tolerated well   DMII (diabetes mellitus, type 2) (HCC) - Prior A1c's around 5.7 to 6%.  Now has A1c 8.8%.  Discussed with wife that may need insulin  at discharge - RD also consulted for assistance; tube feeds changed to Glucerna - Continue Lantus  and SSI.  Will modify as needed - Some increase in CBGs after Glucerna uptitrated.  Insulin  regimen adjusted - Tentative plan for freestyle libre 3 at discharge as well.  Prior Auth completed and approved, $0 - continued on Lantus  and Novolog  SSI at discharge   Acute hypernatremia - In setting of dehydration and DKA -Continue trending BMP - Free water  flushes modified  Hypercalcemia - Due to dehydration and volume depletion - Improved with fluids  Chronic respiratory failure (HCC) - Vent dependent in setting of ALS - Pulmonology managing vent  ALS (amyotrophic lateral sclerosis) (HCC) - Dependent on ventilator and PEG tube for nutrition -Cared for by wife at home.  Also has home care RN  Dehydration-resolved as of 07/03/2024 - S/p IV fluids with improvement in BUN   The patient's acute and chronic medical conditions were treated accordingly. On day of discharge, patient was felt deemed stable for discharge. Patient/family member advised to call PCP or come back to ER if needed.   Principal Diagnosis: DKA (diabetic ketoacidosis) (HCC)  Discharge Diagnoses:  Active Hospital Problems   Diagnosis Date Noted   Cholecystitis 02/26/2024    Priority: 1.   DMII (diabetes  mellitus, type 2) (HCC) 08/31/2023    Priority: 3.   Acute hypernatremia 07/03/2024    Priority: 4.   Hypercalcemia 07/03/2024   Chronic respiratory failure (HCC) 07/02/2024   ALS (amyotrophic lateral sclerosis) (HCC) 01/11/2019    Resolved Hospital Problems   Diagnosis Date Noted Date Resolved   DKA (diabetic ketoacidosis) (HCC) 07/02/2024 07/03/2024    Priority: 2.   Dehydration 07/03/2024 07/03/2024     Discharge Instructions     Discharge wound care:   Complete by: As directed    Continue cholecystostomy drain care and flushing as previously done   Increase activity slowly   Complete by: As directed       Allergies as of 07/07/2024       Reactions   Crab [shellfish Allergy] Hives, Rash   Confirmed no allergy to fish with wife 02/28/24        Medication List     PAUSE taking these medications    glycopyrrolate  0.2 mg/ml Soln Wait to take this until your doctor or other care provider tells you to start again. Continue holding until dry mouth is improved Commonly known as: CUVPOSA  Place 5 mLs (1,000 mcg total) into feeding tube 2 (two) times daily.   scopolamine  1 MG/3DAYS Wait to take this until your doctor or other care provider tells you to start again. Continue holding until dry mouth is improved Commonly known as: TRANSDERM-SCOP PLACE 1 PATCH ONTO THE SKIN EVERY 3 DAYS       TAKE these medications    Accu-Chek Guide Test test strip Generic drug: glucose blood Use up to four times daily as directed. (FOR ICD-10 E10.9, E11.9).   Accu-Chek Guide w/Device Kit Use up to four times daily as directed   Accu-Chek Softclix Lancets lancets Use up to four times daily as directed   acetaminophen  160 MG/5ML solution Commonly known as: TYLENOL  Place 30 mLs into feeding tube every 6 (six) hours as needed for fever.   amoxicillin -clavulanate 400-57 MG/5ML suspension Commonly known as: AUGMENTIN  Place 10.9 mLs (875 mg total) into feeding tube 2 (two) times  daily for 10 days. **Discard Remainder**   cetirizine  HCl 5 MG/5ML Soln Commonly known as: Zyrtec  Take 10 mLs (10 mg total) by mouth at bedtime. What changed: how to take this   Clear Eyes Natural Tears 5-6 MG/ML Soln Generic drug: Polyvinyl Alcohol -Povidone Apply 1 drop to eye 3 (three) times daily as needed.   docusate 50 MG/5ML liquid Commonly known as: COLACE Place 10 mLs into feeding tube daily as needed (constipation).   Embecta Pen Needle Nano 32G X 4 MM Misc Generic drug: Insulin  Pen Needle Use up to four times daily as directed   feeding supplement (GLUCERNA 1.5 CAL) Liqd Place 1,000 mLs into feeding tube continuous. 45 mL/hr   free water  Soln Place 200 mLs into feeding tube every 6 (six) hours. What changed: how much to take   FreeStyle Libre 3 Plus Sensor Misc Change sensor every 15 days.   ipratropium-albuterol  0.5-2.5 (3) MG/3ML Soln Commonly known as: DUONEB USE 3 ML VIA NEBULIZER EVERY 4 HOURS AS NEEDED   Lancet Device Misc Use up to four times daily as directed. (FOR ICD-10 E10.9, E11.9).   Lantus  SoloStar 100 UNIT/ML Solostar Pen Generic drug: insulin  glargine Inject 12 Units into the skin daily. May substitute as needed per insurance.   metoCLOPramide  5  MG/5ML solution Commonly known as: REGLAN  Place 10 mLs (10 mg total) into feeding tube every 8 (eight) hours as needed for nausea.   NovoLOG  FlexPen 100 UNIT/ML FlexPen Generic drug: insulin  aspart Inject 0-11 Units into the skin 4 (four) times daily -  before meals and at bedtime. Check Blood Glucose (BG) and inject per scale: BG <150= 0 unit; BG 150-200= 1 unit; BG 201-250= 3 unit; BG 251-300= 5 unit; BG 301-350= 7 unit; BG 351-400= 9 unit; BG >400= 11 unit and Call Primary Care.   polyethylene glycol powder 17 GM/SCOOP powder Commonly known as: GLYCOLAX /MIRALAX  Place 17 g into feeding tube daily. Titrate as needed for constipation What changed:  when to take this reasons to take this    Simethicone  40 MG/0.6ML Liqd 1.2 mLs (80 mg total) by Per J Tube route in the morning, at noon, in the evening, and at bedtime. What changed:  when to take this reasons to take this               Durable Medical Equipment  (From admission, onward)           Start     Ordered   07/06/24 1228  For home use only DME Other see comment  Once       Comments: initiate tube feeding via PEG-J once cleared by [facility]:  Glucerna 1.5 at 45 ml/h (1080 ml per day)  Start at 25 and advance by 10mL every 6 hours to reach goal  Free water : every 4 hours  Provides 1620 kcal, 89 gm protein, 820 ml free water  daily ( free water , TF+flush)  Question:  Length of Need  Answer:  Lifetime   07/06/24 1227   07/03/24 1432  For home use only DME Other see comment  Once       Comments: Urine Drainage Bags,   Condom catheters  Question:  Length of Need  Answer:  Lifetime   07/03/24 1440              Discharge Care Instructions  (From admission, onward)           Start     Ordered   07/07/24 0000  Discharge wound care:       Comments: Continue cholecystostomy drain care and flushing as previously done   07/07/24 1001            Follow-up Information     [facility] (DME) Follow up.   Specialty: DME Services Why: Tube feeding and urinary supplies Contact information: [address] [place]  [address] [phone]               Allergies[1]  Consultations: Pulmonology General surgery IR  Procedures: Cholecystostomy tube replaced by IR on 07/03/2024   Discharge Exam: BP (!) 141/90   Pulse 75   Temp 98.3 F (36.8 C) (Oral)   Resp 16   Ht 5' 5 (1.651 m)   Wt 74.8 kg   SpO2 100%   BMI 27.44 kg/m  Physical Exam Constitutional:      Comments: Chronically ill-appearing adult man resting in bed in no distress  HENT:     Head: Normocephalic and atraumatic.     Mouth/Throat:     Mouth: Mucous  membranes are dry.  Eyes:     Extraocular Movements: Extraocular movements intact.  Cardiovascular:     Rate and Rhythm: Normal rate and regular rhythm.  Pulmonary:     Effort: Pulmonary  effort is normal. No respiratory distress.     Breath sounds: Normal breath sounds.  Abdominal:     General: Bowel sounds are normal. There is distension (Improved).     Palpations: Abdomen is soft.     Tenderness: There is abdominal tenderness (mild nonspecific).     Comments: Cholecystostomy tube in place  Musculoskeletal:        General: Normal range of motion.     Cervical back: Normal range of motion and neck supple.  Skin:    General: Skin is warm and dry.  Neurological:     Mental Status: Mental status is at baseline.     Comments: Uses tablet device to communicate; LE paralysis noted      The results of significant diagnostics from this hospitalization (including imaging, microbiology, ancillary and laboratory) are listed below for reference.   Microbiology: Recent Results (from the past 240 hours)  Blood Culture (routine x 2)     Status: Abnormal   Collection Time: 07/02/24  6:04 PM   Specimen: BLOOD  Result Value Ref Range Status   Specimen Description BLOOD SITE NOT SPECIFIED  Final   Special Requests   Final    BOTTLES DRAWN AEROBIC AND ANAEROBIC Blood Culture results may not be optimal due to an inadequate volume of blood received in culture bottles   Culture  Setup Time   Final    GRAM POSITIVE COCCI IN CLUSTERS IN BOTH AEROBIC AND ANAEROBIC BOTTLES CRITICAL RESULT CALLED TO, READ BACK BY AND VERIFIED WITH: PHARMD ESABRA SLADE [id number]@ 1514 fh    Culture (A)  Final    STAPHYLOCOCCUS EPIDERMIDIS THE SIGNIFICANCE OF ISOLATING THIS ORGANISM FROM A SINGLE SET OF BLOOD CULTURES WHEN MULTIPLE SETS ARE DRAWN IS UNCERTAIN. PLEASE NOTIFY THE MICROBIOLOGY DEPARTMENT WITHIN ONE WEEK IF SPECIATION AND SENSITIVITIES ARE REQUIRED. Performed at [facility], [address],  [place], [place] [address]    Report Status 07/05/2024 FINAL  Final  Blood Culture ID Panel (Reflexed)     Status: Abnormal   Collection Time: 07/02/24  6:04 PM  Result Value Ref Range Status   Enterococcus faecalis NOT DETECTED NOT DETECTED Final   Enterococcus Faecium NOT DETECTED NOT DETECTED Final   Listeria monocytogenes NOT DETECTED NOT DETECTED Final   Staphylococcus species DETECTED (A) NOT DETECTED Final    Comment: CRITICAL RESULT CALLED TO, READ BACK BY AND VERIFIED WITH: PHARMD ESABRA SLADE [id number]@ 1514 fh    Staphylococcus aureus (BCID) NOT DETECTED NOT DETECTED Final   Staphylococcus epidermidis DETECTED (A) NOT DETECTED Final    Comment: CRITICAL RESULT CALLED TO, READ BACK BY AND VERIFIED WITH: PHARMD ESABRA SLADE [id number]@ 1514 fh    Staphylococcus lugdunensis NOT DETECTED NOT DETECTED Final   Streptococcus species NOT DETECTED NOT DETECTED Final   Streptococcus agalactiae NOT DETECTED NOT DETECTED Final   Streptococcus pneumoniae NOT DETECTED NOT DETECTED Final   Streptococcus pyogenes NOT DETECTED NOT DETECTED Final   A.calcoaceticus-baumannii NOT DETECTED NOT DETECTED Final   Bacteroides fragilis NOT DETECTED NOT DETECTED Final   Enterobacterales NOT DETECTED NOT DETECTED Final   Enterobacter cloacae complex NOT DETECTED NOT DETECTED Final   Escherichia coli NOT DETECTED NOT DETECTED Final   Klebsiella aerogenes NOT DETECTED NOT DETECTED Final   Klebsiella oxytoca NOT DETECTED NOT DETECTED Final   Klebsiella pneumoniae NOT DETECTED NOT DETECTED Final   Proteus species NOT DETECTED NOT DETECTED Final   Salmonella species NOT DETECTED NOT DETECTED Final   Serratia marcescens NOT DETECTED  NOT DETECTED Final   Haemophilus influenzae NOT DETECTED NOT DETECTED Final   Neisseria meningitidis NOT DETECTED NOT DETECTED Final   Pseudomonas aeruginosa NOT DETECTED NOT DETECTED Final   Stenotrophomonas maltophilia NOT DETECTED NOT DETECTED Final   Candida albicans NOT DETECTED  NOT DETECTED Final   Candida auris NOT DETECTED NOT DETECTED Final   Candida glabrata NOT DETECTED NOT DETECTED Final   Candida krusei NOT DETECTED NOT DETECTED Final   Candida parapsilosis NOT DETECTED NOT DETECTED Final   Candida tropicalis NOT DETECTED NOT DETECTED Final   Cryptococcus neoformans/gattii NOT DETECTED NOT DETECTED Final   Methicillin resistance mecA/C DETECTED (A) NOT DETECTED Final    Comment: CRITICAL RESULT CALLED TO, READ BACK BY AND VERIFIED WITH: MAYA CHARLENA SLADE [id number]@ 1514 fh Performed at [facility], [address] [place], [place] [address]   Resp panel by RT-PCR (RSV, Flu A&B, Covid) Anterior Nasal Swab     Status: None   Collection Time: 07/02/24  6:19 PM   Specimen: Anterior Nasal Swab  Result Value Ref Range Status   SARS Coronavirus 2 by RT PCR NEGATIVE NEGATIVE Final   Influenza A by PCR NEGATIVE NEGATIVE Final   Influenza B by PCR NEGATIVE NEGATIVE Final    Comment: (NOTE) The Xpert Xpress SARS-CoV-2/FLU/RSV plus assay is intended as an aid in the diagnosis of influenza from Nasopharyngeal swab specimens and should not be used as a sole basis for treatment. Nasal washings and aspirates are unacceptable for Xpert Xpress SARS-CoV-2/FLU/RSV testing.  Fact Sheet for Patients: bloggercourse.com  Fact Sheet for Healthcare Providers: seriousbroker.it  This test is not yet approved or cleared by the [place]  FDA and has been authorized for detection and/or diagnosis of SARS-CoV-2 by FDA under an Emergency Use Authorization (EUA). This EUA will remain in effect (meaning this test can be used) for the duration of the COVID-19 declaration under Section 564(b)(1) of the Act, 21 U.S.C. section 360bbb-3(b)(1), unless the authorization is terminated or revoked.     Resp Syncytial Virus by PCR NEGATIVE NEGATIVE Final    Comment: (NOTE) Fact Sheet for  Patients: bloggercourse.com  Fact Sheet for Healthcare Providers: seriousbroker.it  This test is not yet approved or cleared by the [place]  FDA and has been authorized for detection and/or diagnosis of SARS-CoV-2 by FDA under an Emergency Use Authorization (EUA). This EUA will remain in effect (meaning this test can be used) for the duration of the COVID-19 declaration under Section 564(b)(1) of the Act, 21 U.S.C. section 360bbb-3(b)(1), unless the authorization is terminated or revoked.  Performed at [facility], [address] [place], [place] [address]   Blood Culture (routine x 2)     Status: None   Collection Time: 07/02/24  8:00 PM   Specimen: BLOOD  Result Value Ref Range Status   Specimen Description BLOOD RIGHT ANTECUBITAL  Final   Special Requests   Final    BOTTLES DRAWN AEROBIC AND ANAEROBIC Blood Culture results may not be optimal due to an inadequate volume of blood received in culture bottles   Culture   Final    NO GROWTH 5 DAYS Performed at [facility], [address], [place], [place] [address]    Report Status 07/07/2024 FINAL  Final  Aerobic/Anaerobic Culture w Gram Stain (surgical/deep wound)     Status: None (Preliminary result)   Collection Time: 07/03/24  4:23 PM   Specimen: Gallbladder; Abscess  Result Value Ref Range Status   Specimen  Description GALL BLADDER  Final   Special Requests NONE  Final   Gram Stain   Final    MODERATE WBC PRESENT,BOTH PMN AND MONONUCLEAR FEW GRAM POSITIVE COCCI FEW GRAM NEGATIVE RODS Performed at [facility], [address], [place], [place] [address]    Culture   Final    ABUNDANT ESCHERICHIA COLI ABUNDANT ENTEROCOCCUS FAECALIS NO ANAEROBES ISOLATED; CULTURE IN PROGRESS FOR 5 DAYS    Report Status PENDING  Incomplete   Organism ID, Bacteria ESCHERICHIA COLI  Final   Organism ID, Bacteria ENTEROCOCCUS FAECALIS  Final      Susceptibility    Escherichia coli - MIC*    AMPICILLIN  16 INTERMEDIATE Intermediate     CEFAZOLIN  (NON-URINE) 4 INTERMEDIATE Intermediate     CEFEPIME  <=0.12 SENSITIVE Sensitive     ERTAPENEM <=0.12 SENSITIVE Sensitive     CEFTRIAXONE <=0.25 SENSITIVE Sensitive     CIPROFLOXACIN >=4 RESISTANT Resistant     GENTAMICIN <=1 SENSITIVE Sensitive     MEROPENEM <=0.25 SENSITIVE Sensitive     TRIMETH/SULFA <=20 SENSITIVE Sensitive     AMPICILLIN /SULBACTAM 4 SENSITIVE Sensitive     PIP/TAZO Value in next row Sensitive      <=4 SENSITIVEThis is a modified FDA-approved test that has been validated and its performance characteristics determined by the reporting laboratory.  This laboratory is certified under the Clinical Laboratory Improvement Amendments CLIA as qualified to perform high complexity clinical laboratory testing.    * ABUNDANT ESCHERICHIA COLI   Enterococcus faecalis - MIC*    AMPICILLIN  Value in next row Sensitive      <=4 SENSITIVEThis is a modified FDA-approved test that has been validated and its performance characteristics determined by the reporting laboratory.  This laboratory is certified under the Clinical Laboratory Improvement Amendments CLIA as qualified to perform high complexity clinical laboratory testing.    VANCOMYCIN  Value in next row Sensitive      <=4 SENSITIVEThis is a modified FDA-approved test that has been validated and its performance characteristics determined by the reporting laboratory.  This laboratory is certified under the Clinical Laboratory Improvement Amendments CLIA as qualified to perform high complexity clinical laboratory testing.    GENTAMICIN SYNERGY Value in next row Sensitive      <=4 SENSITIVEThis is a modified FDA-approved test that has been validated and its performance characteristics determined by the reporting laboratory.  This laboratory is certified under the Clinical Laboratory Improvement Amendments CLIA as qualified to perform high complexity clinical  laboratory testing.    * ABUNDANT ENTEROCOCCUS FAECALIS     Labs: BNP (last 3 results) No results for input(s): BNP in the last 8760 hours. Basic Metabolic Panel: Recent Labs  Lab 07/02/24 2357 07/03/24 0321 07/03/24 1020 07/04/24 0402 07/05/24 0346 07/06/24 0400 07/07/24 0301  NA  --  156* 153* 156* 157* 151* 150*  K  --  3.2* 3.9 3.0* 3.2* 3.3* 3.4*  CL  --  122* 122* 122* 124* 119* 116*  CO2  --  18* 19* 20* 22 22 23   GLUCOSE  --  188* 190* 193* 161* 241* 216*  BUN  --  15 14 14 12 12 12   CREATININE  --  0.50* 0.49* 0.47* 0.31* 0.30* <0.30*  CALCIUM   --  10.0 9.9 9.9 9.7 9.4 9.4  MG 2.1  --   --  2.1 2.0  --   --   PHOS  --  2.1* 1.6*  --   --   --   --  Liver Function Tests: Recent Labs  Lab 07/02/24 1804 07/03/24 0321  AST 60* 54*  ALT 177* 150*  ALKPHOS 497* 421*  BILITOT 1.3* 1.2  PROT 9.8* 8.6*  ALBUMIN 4.5 4.1   No results for input(s): LIPASE, AMYLASE in the last 168 hours. No results for input(s): AMMONIA in the last 168 hours. CBC: Recent Labs  Lab 07/02/24 1804 07/02/24 1822 07/02/24 2026 07/03/24 0321 07/04/24 0402  WBC 10.7*  --   --  12.1* 8.9  NEUTROABS 7.6  --   --  8.8* 6.4  HGB 14.1 15.3 14.6 12.7* 12.1*  HCT 47.1 45.0 43.0 41.9 40.0  MCV 84.0  --   --  82.5 82.1  PLT 163  --   --  182 158   Cardiac Enzymes: No results for input(s): CKTOTAL, CKMB, CKMBINDEX, TROPONINI in the last 168 hours. BNP: Invalid input(s): POCBNP CBG: Recent Labs  Lab 07/06/24 1927 07/06/24 2320 07/07/24 0309 07/07/24 0724 07/07/24 1145  GLUCAP 203* 211* 205* 194* 194*   D-Dimer No results for input(s): DDIMER in the last 72 hours. Hgb A1c No results for input(s): HGBA1C in the last 72 hours. Lipid Profile No results for input(s): CHOL, HDL, LDLCALC, TRIG, CHOLHDL, LDLDIRECT in the last 72 hours. Thyroid function studies No results for input(s): TSH, T4TOTAL, T3FREE, THYROIDAB in the last 72  hours.  Invalid input(s): FREET3 Anemia work up No results for input(s): VITAMINB12, FOLATE, FERRITIN, TIBC, IRON, RETICCTPCT in the last 72 hours. Urinalysis    Component Value Date/Time   COLORURINE YELLOW 07/02/2024 1816   APPEARANCEUR CLEAR 07/02/2024 1816   LABSPEC 1.033 (H) 07/02/2024 1816   PHURINE 5.0 07/02/2024 1816   GLUCOSEU >=500 (A) 07/02/2024 1816   HGBUR NEGATIVE 07/02/2024 1816   BILIRUBINUR NEGATIVE 07/02/2024 1816   KETONESUR 80 (A) 07/02/2024 1816   PROTEINUR >=300 (A) 07/02/2024 1816   NITRITE NEGATIVE 07/02/2024 1816   LEUKOCYTESUR NEGATIVE 07/02/2024 1816   Sepsis Labs Recent Labs  Lab 07/02/24 1804 07/03/24 0321 07/04/24 0402  WBC 10.7* 12.1* 8.9   Microbiology Recent Results (from the past 240 hours)  Blood Culture (routine x 2)     Status: Abnormal   Collection Time: 07/02/24  6:04 PM   Specimen: BLOOD  Result Value Ref Range Status   Specimen Description BLOOD SITE NOT SPECIFIED  Final   Special Requests   Final    BOTTLES DRAWN AEROBIC AND ANAEROBIC Blood Culture results may not be optimal due to an inadequate volume of blood received in culture bottles   Culture  Setup Time   Final    GRAM POSITIVE COCCI IN CLUSTERS IN BOTH AEROBIC AND ANAEROBIC BOTTLES CRITICAL RESULT CALLED TO, READ BACK BY AND VERIFIED WITH: PHARMD ESABRA SLADE [id number]@ 1514 fh    Culture (A)  Final    STAPHYLOCOCCUS EPIDERMIDIS THE SIGNIFICANCE OF ISOLATING THIS ORGANISM FROM A SINGLE SET OF BLOOD CULTURES WHEN MULTIPLE SETS ARE DRAWN IS UNCERTAIN. PLEASE NOTIFY THE MICROBIOLOGY DEPARTMENT WITHIN ONE WEEK IF SPECIATION AND SENSITIVITIES ARE REQUIRED. Performed at [facility], [address], [place], [place] [address]    Report Status 07/05/2024 FINAL  Final  Blood Culture ID Panel (Reflexed)     Status: Abnormal   Collection Time: 07/02/24  6:04 PM  Result Value Ref Range Status   Enterococcus faecalis NOT DETECTED NOT DETECTED Final    Enterococcus Faecium NOT DETECTED NOT DETECTED Final   Listeria monocytogenes NOT DETECTED NOT DETECTED Final   Staphylococcus species DETECTED (  A) NOT DETECTED Final    Comment: CRITICAL RESULT CALLED TO, READ BACK BY AND VERIFIED WITH: PHARMD ESABRA SLADE [id number]@ 1514 fh    Staphylococcus aureus (BCID) NOT DETECTED NOT DETECTED Final   Staphylococcus epidermidis DETECTED (A) NOT DETECTED Final    Comment: CRITICAL RESULT CALLED TO, READ BACK BY AND VERIFIED WITH: PHARMD ESABRA SLADE [id number]@ 1514 fh    Staphylococcus lugdunensis NOT DETECTED NOT DETECTED Final   Streptococcus species NOT DETECTED NOT DETECTED Final   Streptococcus agalactiae NOT DETECTED NOT DETECTED Final   Streptococcus pneumoniae NOT DETECTED NOT DETECTED Final   Streptococcus pyogenes NOT DETECTED NOT DETECTED Final   A.calcoaceticus-baumannii NOT DETECTED NOT DETECTED Final   Bacteroides fragilis NOT DETECTED NOT DETECTED Final   Enterobacterales NOT DETECTED NOT DETECTED Final   Enterobacter cloacae complex NOT DETECTED NOT DETECTED Final   Escherichia coli NOT DETECTED NOT DETECTED Final   Klebsiella aerogenes NOT DETECTED NOT DETECTED Final   Klebsiella oxytoca NOT DETECTED NOT DETECTED Final   Klebsiella pneumoniae NOT DETECTED NOT DETECTED Final   Proteus species NOT DETECTED NOT DETECTED Final   Salmonella species NOT DETECTED NOT DETECTED Final   Serratia marcescens NOT DETECTED NOT DETECTED Final   Haemophilus influenzae NOT DETECTED NOT DETECTED Final   Neisseria meningitidis NOT DETECTED NOT DETECTED Final   Pseudomonas aeruginosa NOT DETECTED NOT DETECTED Final   Stenotrophomonas maltophilia NOT DETECTED NOT DETECTED Final   Candida albicans NOT DETECTED NOT DETECTED Final   Candida auris NOT DETECTED NOT DETECTED Final   Candida glabrata NOT DETECTED NOT DETECTED Final   Candida krusei NOT DETECTED NOT DETECTED Final   Candida parapsilosis NOT DETECTED NOT DETECTED Final   Candida tropicalis  NOT DETECTED NOT DETECTED Final   Cryptococcus neoformans/gattii NOT DETECTED NOT DETECTED Final   Methicillin resistance mecA/C DETECTED (A) NOT DETECTED Final    Comment: CRITICAL RESULT CALLED TO, READ BACK BY AND VERIFIED WITH: MAYA CHARLENA SLADE [id number]@ [id number] fh Performed at [facility], [address] [place], [place] [address]   Resp panel by RT-PCR (RSV, Flu A&B, Covid) Anterior Nasal Swab     Status: None   Collection Time: 07/02/24  6:19 PM   Specimen: Anterior Nasal Swab  Result Value Ref Range Status   SARS Coronavirus 2 by RT PCR NEGATIVE NEGATIVE Final   Influenza A by PCR NEGATIVE NEGATIVE Final   Influenza B by PCR NEGATIVE NEGATIVE Final    Comment: (NOTE) The Xpert Xpress SARS-CoV-2/FLU/RSV plus assay is intended as an aid in the diagnosis of influenza from Nasopharyngeal swab specimens and should not be used as a sole basis for treatment. Nasal washings and aspirates are unacceptable for Xpert Xpress SARS-CoV-2/FLU/RSV testing.  Fact Sheet for Patients: bloggercourse.com  Fact Sheet for Healthcare Providers: seriousbroker.it  This test is not yet approved or cleared by the [place]  FDA and has been authorized for detection and/or diagnosis of SARS-CoV-2 by FDA under an Emergency Use Authorization (EUA). This EUA will remain in effect (meaning this test can be used) for the duration of the COVID-19 declaration under Section 564(b)(1) of the Act, 21 U.S.C. section 360bbb-3(b)(1), unless the authorization is terminated or revoked.     Resp Syncytial Virus by PCR NEGATIVE NEGATIVE Final    Comment: (NOTE) Fact Sheet for Patients: bloggercourse.com  Fact Sheet for Healthcare Providers: seriousbroker.it  This test is not yet approved or cleared by the [place]  FDA and has been authorized for detection and/or diagnosis of SARS-CoV-2  by FDA  under an Emergency Use Authorization (EUA). This EUA will remain in effect (meaning this test can be used) for the duration of the COVID-19 declaration under Section 564(b)(1) of the Act, 21 U.S.C. section 360bbb-3(b)(1), unless the authorization is terminated or revoked.  Performed at [facility], [address], [place], [place] [address]   Blood Culture (routine x 2)     Status: None   Collection Time: 07/02/24  8:00 PM   Specimen: BLOOD  Result Value Ref Range Status   Specimen Description BLOOD RIGHT ANTECUBITAL  Final   Special Requests   Final    BOTTLES DRAWN AEROBIC AND ANAEROBIC Blood Culture results may not be optimal due to an inadequate volume of blood received in culture bottles   Culture   Final    NO GROWTH 5 DAYS Performed at [facility], [address], [place], [place] [address]    Report Status 07/07/2024 FINAL  Final  Aerobic/Anaerobic Culture w Gram Stain (surgical/deep wound)     Status: None (Preliminary result)   Collection Time: 07/03/24  4:23 PM   Specimen: Gallbladder; Abscess  Result Value Ref Range Status   Specimen Description GALL BLADDER  Final   Special Requests NONE  Final   Gram Stain   Final    MODERATE WBC PRESENT,BOTH PMN AND MONONUCLEAR FEW GRAM POSITIVE COCCI FEW GRAM NEGATIVE RODS Performed at [facility], [address] [place], [place] [address]    Culture   Final    ABUNDANT ESCHERICHIA COLI ABUNDANT ENTEROCOCCUS FAECALIS NO ANAEROBES ISOLATED; CULTURE IN PROGRESS FOR 5 DAYS    Report Status PENDING  Incomplete   Organism ID, Bacteria ESCHERICHIA COLI  Final   Organism ID, Bacteria ENTEROCOCCUS FAECALIS  Final      Susceptibility   Escherichia coli - MIC*    AMPICILLIN  16 INTERMEDIATE Intermediate     CEFAZOLIN  (NON-URINE) 4 INTERMEDIATE Intermediate     CEFEPIME  <=0.12 SENSITIVE Sensitive     ERTAPENEM <=0.12 SENSITIVE Sensitive     CEFTRIAXONE <=0.25 SENSITIVE Sensitive     CIPROFLOXACIN >=4 RESISTANT  Resistant     GENTAMICIN <=1 SENSITIVE Sensitive     MEROPENEM <=0.25 SENSITIVE Sensitive     TRIMETH/SULFA <=20 SENSITIVE Sensitive     AMPICILLIN /SULBACTAM 4 SENSITIVE Sensitive     PIP/TAZO Value in next row Sensitive      <=4 SENSITIVEThis is a modified FDA-approved test that has been validated and its performance characteristics determined by the reporting laboratory.  This laboratory is certified under the Clinical Laboratory Improvement Amendments CLIA as qualified to perform high complexity clinical laboratory testing.    * ABUNDANT ESCHERICHIA COLI   Enterococcus faecalis - MIC*    AMPICILLIN  Value in next row Sensitive      <=4 SENSITIVEThis is a modified FDA-approved test that has been validated and its performance characteristics determined by the reporting laboratory.  This laboratory is certified under the Clinical Laboratory Improvement Amendments CLIA as qualified to perform high complexity clinical laboratory testing.    VANCOMYCIN  Value in next row Sensitive      <=4 SENSITIVEThis is a modified FDA-approved test that has been validated and its performance characteristics determined by the reporting laboratory.  This laboratory is certified under the Clinical Laboratory Improvement Amendments CLIA as qualified to perform high complexity clinical laboratory testing.    GENTAMICIN SYNERGY Value in next row Sensitive      <=4 SENSITIVEThis is a modified FDA-approved test that  has been validated and its performance characteristics determined by the reporting laboratory.  This laboratory is certified under the Clinical Laboratory Improvement Amendments CLIA as qualified to perform high complexity clinical laboratory testing.    * ABUNDANT ENTEROCOCCUS FAECALIS    Procedures/Studies: IR Perc Cholecystostomy Result Date: 07/03/2024 INDICATION: Diabetic ketoacidosis and concern for recurrent cholecystitis. Previous cholecystostomy tube has been removed. Persistent drainage from  cholecystostomy tube site. EXAM: REPLACEMENT OF CHOLECYSTOSTOMY TUBE WITH FLUOROSCOPY MEDICATIONS: Moderate sedation ANESTHESIA/SEDATION: Moderate (conscious) sedation was employed during this procedure. A total of Versed  1.5 mg and Fentanyl  100 mcg was administered intravenously by the radiology nurse. Total intra-service moderate Sedation Time: 15 minutes. The patient's level of consciousness and vital signs were monitored continuously by radiology nursing throughout the procedure under my direct supervision. FLUOROSCOPY: Radiation Exposure Index (as provided by the fluoroscopic device): 22 mGy Kerma CONTRAST:  20 mL Omnipaque  300 COMPLICATIONS: None immediate. PROCEDURE: Informed consent was obtained for cholecystostomy tube placement/replacement. Maximal Sterile Barrier Technique was utilized including caps, mask, sterile gowns, sterile gloves, sterile drape, hand hygiene and skin antiseptic. A timeout was performed prior to the initiation of the procedure. Patient was placed supine. The right abdomen was prepped and draped in sterile fashion. Kumpe catheter was advanced through the old cholecystostomy tube track using fluoroscopy. Contrast injection confirmed placement in the irregular gallbladder. Multiple cholangiogram images were obtained. Superstiff Amplatz wire was placed. Ultrasound confirmed that the wire was positioned within the gallbladder. 10 French multipurpose drain was advanced over the wire and reconstituted in the gallbladder fundus region. Drain was flushed with saline and attached to a gravity bag. Skin was anesthetized with 1% lidocaine  and cholecystostomy tube was sutured to skin. FINDINGS: Catheter was successfully advanced into the gallbladder through the old cholecystostomy tube tract. Again noted is a very abnormal appearing gallbladder with filling defects and diffuse mucosal irregularity. Contrast fills the duodenum without visualization of a normal cystic duct or common bile duct.  IMPRESSION: 1. Successful replacement of the cholecystostomy tube through the old tract. 2. Re-demonstration of a markedly abnormal gallbladder with mucosal irregularity. 3. Abnormal communication between the gallbladder and the duodenum. A normal cystic duct and common bile duct are not visualized. Electronically Signed   By: Juliene Balder M.D.   On: 07/03/2024 20:48   CT ABDOMEN PELVIS WO CONTRAST Result Date: 07/02/2024 EXAM: CT ABDOMEN AND PELVIS WITHOUT CONTRAST 07/02/2024 07:26:20 PM TECHNIQUE: CT of the abdomen and pelvis was performed without the administration of intravenous contrast. Multiplanar reformatted images are provided for review. Automated exposure control, iterative reconstruction, and/or weight-based adjustment of the mA/kV was utilized to reduce the radiation dose to as low as reasonably achievable. COMPARISON: Prior examination of 02/26/2024. CLINICAL HISTORY: Bowel obstruction suspected. FINDINGS: LOWER CHEST: Peribronchovascular nodule or infiltrate within the visualized right lung base is new from prior examination and likely relates to acute infection. Trace left pleural effusion with associated left basilar atelectasis. LIVER: The liver is unremarkable. GALLBLADDER AND BILE DUCTS: A small amount of layering calcific debris is noted within the gallbladder fundus. The gallbladder now demonstrates marked diffuse wall thickening and extensive pericholecystic inflammatory stranding in keeping with changes of acute calculus cholecystitis. No biliary ductal dilatation. SPLEEN: No acute abnormality. PANCREAS: No acute abnormality. ADRENAL GLANDS: No acute abnormality. KIDNEYS, URETERS AND BLADDER: Numerous nonobstructing calculi are seen within the kidneys bilaterally measuring up to 8 mm. No ureteral calculi. No hydronephrosis. No perinephric inflammatory stranding or fluid collections were identified. The bladder is unremarkable. GI AND BOWEL:  Retention gastrojejunostomies are in place with a  catheter coiled within the gastric lumen. Appendix is normal. The stomach, small bowel, and large bowel are otherwise unremarkable. There is no bowel obstruction. PERITONEUM AND RETROPERITONEUM: No ascites. No free air. VASCULATURE: Aorta is normal in caliber. LYMPH NODES: No lymphadenopathy. REPRODUCTIVE ORGANS: No acute abnormality. BONES AND SOFT TISSUES: Osseous structures are age appropriate. No acute bone abnormality. No lytic or blastic bone lesion. Marked atrophy of the skeletal musculature of the retroperitoneum, paraspinal region, and lower extremities bilaterally. IMPRESSION: 1. No evidence of bowel obstruction. 2. Acute calculus cholecystitis. 3. New peribronchovascular nodule or infiltrate within the visualized right lung base, likely related to acute infection. 4. Numerous nonobstructing renal calculi bilaterally measuring up to 8 mm, without hydronephrosis. 5. Marked atrophy of the skeletal musculature of the retroperitoneum, paraspinal region, and lower extremities bilaterally in keeping with the patient's known als . Electronically signed by: Dorethia Molt MD 07/02/2024 09:17 PM EST RP Workstation: [id number]   DG Chest Port 1 View Result Date: 07/02/2024 EXAM: 1 VIEW(S) XRAY OF THE CHEST 07/02/2024 06:35:00 PM COMPARISON: 05/28/2024 CLINICAL HISTORY: Questionable sepsis - evaluate for abnormality FINDINGS: LINES, TUBES AND DEVICES: Tracheostomy tube in place over tracheal air column. Right IJ dual lumen central venous catheter with tip at right atrium. Percutaneous gastrojejunostomy tube in upper abdomen, likely coiled within the gastric lumen. LUNGS AND PLEURA: Low lung volumes. Mildly elevated left hemidiaphragm. No focal pulmonary opacity. No pleural effusion. No pneumothorax. HEART AND MEDIASTINUM: No acute abnormality of the cardiac and mediastinal silhouettes. BONES AND SOFT TISSUES: No acute osseous abnormality. IMPRESSION: 1. No acute cardiopulmonary abnormality. 2. Tracheostomy tube  and right IJ dual lumen central venous catheter in place. 3. Percutaneous gastrojejunostomy tube likely coiled within the gastric lumen. Electronically signed by: Dorethia Molt MD 07/02/2024 08:47 PM EST RP Workstation: [id number]     Time coordinating discharge: Over 30 minutes    Alm Apo, MD  [facility] 07/07/2024, 2:48 PM    [1]  Allergies Allergen Reactions   Crab [Shellfish Allergy] Hives and Rash    Confirmed no allergy to fish with wife 02/28/24   "

## 2024-07-08 LAB — AEROBIC/ANAEROBIC CULTURE W GRAM STAIN (SURGICAL/DEEP WOUND)

## 2024-07-09 ENCOUNTER — Other Ambulatory Visit (HOSPITAL_COMMUNITY): Payer: Self-pay

## 2024-07-09 MED ORDER — ACCU-CHEK GUIDE W/DEVICE KIT
PACK | Freq: Four times a day (QID) | 0 refills | Status: AC
  Filled 2024-07-09: qty 1, 30d supply, fill #0

## 2024-08-08 ENCOUNTER — Other Ambulatory Visit: Payer: Self-pay | Admitting: Family Medicine

## 2024-08-08 ENCOUNTER — Other Ambulatory Visit (HOSPITAL_COMMUNITY): Payer: Self-pay | Admitting: Nurse Practitioner

## 2024-08-08 ENCOUNTER — Telehealth (HOSPITAL_COMMUNITY): Payer: Self-pay

## 2024-08-08 DIAGNOSIS — Z931 Gastrostomy status: Secondary | ICD-10-CM

## 2024-08-08 DIAGNOSIS — R6889 Other general symptoms and signs: Secondary | ICD-10-CM

## 2024-08-08 DIAGNOSIS — G1221 Amyotrophic lateral sclerosis: Secondary | ICD-10-CM

## 2024-08-08 NOTE — Telephone Encounter (Signed)
 LMOM for wife to call back for scheduling info.

## 2024-08-10 ENCOUNTER — Other Ambulatory Visit (HOSPITAL_COMMUNITY): Payer: Self-pay | Admitting: Nurse Practitioner

## 2024-08-10 ENCOUNTER — Encounter (HOSPITAL_COMMUNITY): Payer: Self-pay

## 2024-08-10 ENCOUNTER — Ambulatory Visit (HOSPITAL_COMMUNITY): Admission: RE | Admit: 2024-08-10 | Source: Ambulatory Visit | Admitting: Interventional Radiology

## 2024-08-10 ENCOUNTER — Inpatient Hospital Stay (HOSPITAL_COMMUNITY): Admission: RE | Admit: 2024-08-10 | Admitting: Interventional Radiology

## 2024-08-10 DIAGNOSIS — K9423 Gastrostomy malfunction: Secondary | ICD-10-CM

## 2024-08-10 MED ORDER — IOHEXOL 300 MG/ML  SOLN
50.0000 mL | Freq: Once | INTRAMUSCULAR | Status: AC | PRN
  Administered 2024-08-10: 25 mL

## 2024-08-10 NOTE — Telephone Encounter (Signed)
 Requested medication (s) are due for refill today: routing for review  Requested medication (s) are on the active medication list: yes  Last refill:  08/18/23  Future visit scheduled: {Yes  Notes to clinic:   Medication not assigned to a protocol, review manually.      Requested Prescriptions  Pending Prescriptions Disp Refills   scopolamine  (TRANSDERM-SCOP) 1 MG/3DAYS [Pharmacy Med Name: SCOPOLAMINE  TRANSDERMAL PATCHES] 30 patch     Sig: PLACE 1 PATCH ONTO THE SKIN ONCE EVERY 3 DAYS     Off-Protocol Failed - 08/10/2024 11:30 AM      Failed - Medication not assigned to a protocol, review manually.      Passed - Valid encounter within last 12 months    Recent Outpatient Visits           6 months ago ALS (amyotrophic lateral sclerosis) [facility])   [facility] Sowles, Krichna, MD

## 2024-08-10 NOTE — Telephone Encounter (Signed)
 Lvm for pt to call back and schedule an appt.
